# Patient Record
Sex: Male | Born: 1955 | Race: White | Hispanic: No | Marital: Married | Smoking: Never smoker
Health system: Southern US, Community
[De-identification: ages and names within clinical notes are randomized; demographics above are authoritative.]

## PROBLEM LIST (undated history)

## (undated) DIAGNOSIS — Z87442 Personal history of urinary calculi: Secondary | ICD-10-CM

## (undated) DIAGNOSIS — I8390 Asymptomatic varicose veins of unspecified lower extremity: Secondary | ICD-10-CM

## (undated) DIAGNOSIS — I10 Essential (primary) hypertension: Secondary | ICD-10-CM

## (undated) DIAGNOSIS — D126 Benign neoplasm of colon, unspecified: Secondary | ICD-10-CM

## (undated) DIAGNOSIS — K279 Peptic ulcer, site unspecified, unspecified as acute or chronic, without hemorrhage or perforation: Secondary | ICD-10-CM

## (undated) DIAGNOSIS — G459 Transient cerebral ischemic attack, unspecified: Secondary | ICD-10-CM

## (undated) DIAGNOSIS — G4733 Obstructive sleep apnea (adult) (pediatric): Secondary | ICD-10-CM

## (undated) DIAGNOSIS — I890 Lymphedema, not elsewhere classified: Secondary | ICD-10-CM

## (undated) DIAGNOSIS — Z87828 Personal history of other (healed) physical injury and trauma: Secondary | ICD-10-CM

## (undated) DIAGNOSIS — K5790 Diverticulosis of intestine, part unspecified, without perforation or abscess without bleeding: Secondary | ICD-10-CM

## (undated) DIAGNOSIS — J61 Pneumoconiosis due to asbestos and other mineral fibers: Secondary | ICD-10-CM

## (undated) DIAGNOSIS — Z8673 Personal history of transient ischemic attack (TIA), and cerebral infarction without residual deficits: Secondary | ICD-10-CM

## (undated) DIAGNOSIS — K2970 Gastritis, unspecified, without bleeding: Secondary | ICD-10-CM

## (undated) HISTORY — DX: Obstructive sleep apnea (adult) (pediatric): G47.33

## (undated) HISTORY — DX: Lymphedema, not elsewhere classified: I89.0

## (undated) HISTORY — DX: Personal history of transient ischemic attack (TIA), and cerebral infarction without residual deficits: Z86.73

## (undated) HISTORY — DX: Asymptomatic varicose veins of unspecified lower extremity: I83.90

## (undated) HISTORY — DX: Personal history of urinary calculi: Z87.442

## (undated) HISTORY — DX: Peptic ulcer, site unspecified, unspecified as acute or chronic, without hemorrhage or perforation: K27.9

## (undated) HISTORY — PX: KIDNEY STONE SURGERY: SHX686

## (undated) HISTORY — DX: Diverticulosis of intestine, part unspecified, without perforation or abscess without bleeding: K57.90

## (undated) HISTORY — DX: Benign neoplasm of colon, unspecified: D12.6

## (undated) HISTORY — DX: Essential (primary) hypertension: I10

## (undated) HISTORY — DX: Gastritis, unspecified, without bleeding: K29.70

## (undated) HISTORY — DX: Personal history of other (healed) physical injury and trauma: Z87.828

---

## 1982-09-18 HISTORY — PX: VASECTOMY: SHX75

## 1999-01-11 ENCOUNTER — Inpatient Hospital Stay (HOSPITAL_COMMUNITY): Admission: AD | Admit: 1999-01-11 | Discharge: 1999-01-13 | Payer: Self-pay | Admitting: Cardiology

## 2003-03-19 DIAGNOSIS — Z8673 Personal history of transient ischemic attack (TIA), and cerebral infarction without residual deficits: Secondary | ICD-10-CM

## 2003-03-19 HISTORY — DX: Personal history of transient ischemic attack (TIA), and cerebral infarction without residual deficits: Z86.73

## 2003-03-20 ENCOUNTER — Encounter: Payer: Self-pay | Admitting: Neurology

## 2003-03-20 ENCOUNTER — Inpatient Hospital Stay (HOSPITAL_COMMUNITY): Admission: AD | Admit: 2003-03-20 | Discharge: 2003-03-24 | Payer: Self-pay | Admitting: Neurology

## 2003-03-23 ENCOUNTER — Encounter: Payer: Self-pay | Admitting: Neurology

## 2004-04-19 ENCOUNTER — Encounter: Admission: RE | Admit: 2004-04-19 | Discharge: 2004-04-19 | Payer: Self-pay | Admitting: Sports Medicine

## 2005-11-30 ENCOUNTER — Ambulatory Visit (HOSPITAL_COMMUNITY): Admission: RE | Admit: 2005-11-30 | Discharge: 2005-11-30 | Payer: Self-pay | Admitting: Urology

## 2007-12-17 ENCOUNTER — Ambulatory Visit: Payer: Self-pay | Admitting: Vascular Surgery

## 2007-12-18 ENCOUNTER — Ambulatory Visit: Payer: Self-pay | Admitting: Vascular Surgery

## 2008-01-15 ENCOUNTER — Ambulatory Visit: Payer: Self-pay | Admitting: Vascular Surgery

## 2008-01-22 ENCOUNTER — Ambulatory Visit: Payer: Self-pay | Admitting: Vascular Surgery

## 2008-03-06 ENCOUNTER — Ambulatory Visit: Payer: Self-pay | Admitting: Vascular Surgery

## 2008-04-29 ENCOUNTER — Encounter: Admission: RE | Admit: 2008-04-29 | Discharge: 2008-04-29 | Payer: Self-pay | Admitting: General Surgery

## 2008-05-21 ENCOUNTER — Encounter: Admission: RE | Admit: 2008-05-21 | Discharge: 2008-05-21 | Payer: Self-pay | Admitting: General Surgery

## 2008-05-27 ENCOUNTER — Encounter: Admission: RE | Admit: 2008-05-27 | Discharge: 2008-05-27 | Payer: Self-pay | Admitting: Interventional Radiology

## 2008-06-30 ENCOUNTER — Encounter: Admission: RE | Admit: 2008-06-30 | Discharge: 2008-06-30 | Payer: Self-pay | Admitting: Interventional Radiology

## 2008-12-01 ENCOUNTER — Encounter: Admission: RE | Admit: 2008-12-01 | Discharge: 2008-12-01 | Payer: Self-pay | Admitting: Interventional Radiology

## 2010-06-18 HISTORY — PX: VEIN SURGERY: SHX48

## 2011-01-03 DIAGNOSIS — E119 Type 2 diabetes mellitus without complications: Secondary | ICD-10-CM | POA: Insufficient documentation

## 2011-01-31 NOTE — Assessment & Plan Note (Signed)
OFFICE VISIT   LENOX, BINK  DOB:  [birth year]                                       12/18/2007  SAYTK#:[id number]   HISTORY:  The patient returns to the office today for continued followup  of extreme bilateral venous hypertension.  He has venous ulceration  bilaterally much more dramatic on the left than on the right.  He has  undergone venous duplex and this reveals incompetence of his great  saphenous vein on the left.  He has no evidence of deep venous  thrombosis or deep venous incompetence.  I discussed this at length with  the patient.  He does have CEAP class 6 venous hypertension with venous  ulceration.  He is an excellent candidate for intraluminal ablation of  his saphenous vein for improvement of his venous hypertension.  I have  recommended that we proceed with this as soon as possible to hopefully  aid in the healing.  He will continue in the meantime to follow up with  Dr. Burman Freestone at the [facility] wound care center and is being treated  appropriately with compression dressings currently.  He has clearly  failed his conservative treatment and has extreme venous hypertension  which can be improved with ablation.  We will proceed with this as soon  as we have assured insurance coverage.   Larina Earthly, M.D.  Electronically Signed   TFE/MEDQ  D:  12/18/2007  T:  12/18/2007  Job:  [id number]   cc:   Nelta Numbers. Edilia Bo, M.D.  Burman Freestone, Dr

## 2011-01-31 NOTE — Assessment & Plan Note (Signed)
OFFICE VISIT   OLANREWAJU, OSBORN  DOB:  1955-10-11                                       03/06/2008  ZOXWR#:[id number]   The patient presents today for 6-week followup after laser ablation of  the left greater saphenous vein.  He has a history of chronic lower  extremity venous ulcers and major obesity.  He has complete resolved the  erythema from the laser ablation of his thigh.  He has continued to have  treatment at the [facility] with Dr. Burman Freestone regarding his open  venous wounds.  He had had ultrasound of his right leg showing multiple  saphenous veins with no significant reflux, and therefore, is not a  candidate for an ablation on the right leg.  He had questions regarding  the adequacy of treatment in [place] and I told him that we have seen very  good results from the wound center at [facility].  He thinks that he  had family members who had lesser times for healing, and I explained  that this is extremely variable depending on the cause of the wound.  He  will continue his wound treatment and see [place] on an as needed basis.   Larina Earthly, M.D.  Electronically Signed   TFE/MEDQ  D:  03/06/2008  T:  03/09/2008  Job:  [id number]

## 2011-01-31 NOTE — Procedures (Signed)
LOWER EXTREMITY VENOUS REFLUX EXAM   INDICATION:  Follow-up evaluation, status post left leg greater  saphenous vein laser ablation on 01/15/08.  Right leg swelling and pain  with varicosities.   EXAM:  Using color-flow imaging and pulse Doppler spectral analysis, the  right and left common femoral, superficial femoral, popliteal, posterior  tibial, greater and lesser saphenous veins are evaluated.  There is  evidence suggesting mild deep venous insufficiency in the right common  femoral vein and proximal superficial femoral vein.   The right saphenofemoral junction is competent.  The right GSV is  competent .  The anterior branch of the left GSV is thrombosed.  The  posterior and medial branch of the left GSV is patent and competent.   The right and left proximal short saphenous vein demonstrate competency.   GSV Diameter (used if found to be incompetent only)                                            Right    Left  Proximal Greater Saphenous Vein           cm       cm  Proximal-to-mid-thigh                     cm       cm  Mid thigh                                 cm       cm  Mid-distal thigh                          cm       cm  Distal thigh                              cm       cm  Knee                                      cm       cm   IMPRESSION:  1. No right or left greater saphenous vein reflux is identified.  2. The right and left greater saphenous veins are not aneurysmal.  3. The right and left greater saphenous veins are not tortuous.  4. The deep venous system has mild reflux in the right common femoral      vein and proximal right superficial femoral vein.  5. The right and left lesser saphenous veins are competent.  6. The anterior branch of the left greater saphenous vein is      thrombosed.  The posterior and medial branch is patent and      competent.  7. No evidence of deep venous thrombosis bilaterally.  8. The anterior branch of the left  greater saphenous vein demonstrates      a very small amount of incompetent flow in the proximal 3 cm of the      thrombosed vessel.   ___________________________________________  Larina Earthly, M.D.   MC/MEDQ  D:  01/22/2008  T:  01/22/2008  Job:  [id number]

## 2011-01-31 NOTE — Assessment & Plan Note (Signed)
OFFICE VISIT   [name], [name] [name]  DOB:  [birth year]                                       01/22/2008  GNFAO#:[id number]   The patient presents today for followup of his laser ablation of his  left leg great saphenous vein from 01/15/2008.  He had the usual amount  of soreness in the thigh at the ablation site.  He underwent venous  duplex today of this area and this shows no evidence of deep venous  thrombosis.  It does show closure of his great saphenous vein from the  knee up to the saphenofemoral junction.  He also underwent evaluation of  his right leg due to the swelling on his right leg.  He does have  several branches of his great saphenous vein and these are all competent  by duplex.  I discussed this with the patient and explained that there  is no role for ablation on the right leg.  He will continue with his  compression wraps and I will see him again in 6 weeks for final  followup.   Larina Earthly, M.D.  Electronically Signed   TFE/MEDQ  D:  01/22/2008  T:  01/23/2008  Job:  [id number]   cc:   Fara Chute  Dr. Yolanda Bonine

## 2011-01-31 NOTE — Assessment & Plan Note (Signed)
OFFICE VISIT   [name] [name], [name] [name]  DOB:  11/06/1955                                       12/17/2007  ZOXWR#:[id number]   I saw the patient in the office today in continued followup of his  chronic venous insufficiency.  I had last seen him in March 2006.  He  has had a long history of chronic venous insufficiency in both lower  extremities.  In September of this year, he began having increasing  problems with swelling and seepage from the left leg.  He saw Dr. Burman Freestone  in December, who has been treating this aggressively with compression  therapy and elevation.  However, he has continued to have some seepage  from the leg, and he was sent for further evaluation.  He has had only 1  small open ulcer.  He is not aware of any previous history of DVT or  phlebitis.   PAST MEDICAL HISTORY:  Significant for hypertension,  hypercholesterolemia, and obesity.  In addition, he had a stroke in  2004.   FAMILY HISTORY:  There is no history of premature cardiovascular  disease.   SOCIAL HISTORY:  He is married.  He has 3 children.  He works as a  Facilities manager.  He does not use tobacco.   REVIEW OF SYSTEMS AND MEDICATIONS:  Documented on the medical history  form in his chart.   PHYSICAL EXAMINATION:  This is a pleasant 55 year old gentleman who  appears his stated age.  Blood pressure is 140/80, heart rate 60,  saturation 95%.  HEENT, neck is supple.  There is no cervical  lymphadenopathy.  I do not detect any carotid bruits.  Lungs are clear  bilaterally to auscultation.  On cardiac exam, he has a regular rate and  rhythm.  Abdomen is obese and difficult to assess for an aneurysm.  He  has palpable femoral pulses and a palpable left dorsalis pedis pulse.  Because of the swelling, I could not palpate posterior tibial pulses or  a right dorsalis pedis pulse.  He does have biphasic Doppler signals in  the dorsalis pedis and posterior tibial positions bilaterally.  He  has  significant hyperpigmentation bilaterally and severe bilateral lower  extremity swelling, more significant on the left side.  He has eczema  and lymphatic seepage in the left leg.   He did have a recent venous duplex scan on 11/21/2007, which showed no  incompetence of the deep system on the left and no evidence of DVT.  The  superficial system is not evaluated.  He also had normal ABI back in  December 2008.  He has had a previous venous duplex today in our office  back in 2002, which showed incompetence of the right greater saphenous  vein below the knee.  On the left side, he had incompetence of the left  saphenofemoral junction, greater saphenous vein, and lesser saphenous  vein.   Given continued problems with seepage from the left leg and significant  chronic venous insufficiency, despite aggressive outpatient care, I  think it would be reasonable to consider laser ablation of the left  greater saphenous vein to help improve his venous hemodynamics and  potentially help control his seepage in the left leg.  We will schedule  him to see Dr. Arbie Cookey or Dr. Hart Rochester, who have been performing the laser  ablations, and will also have them discuss this with our vein nurse, Clementeen Hoof.  In the meantime, he will continue with his elevation and  compression therapy.   Di Kindle. Edilia Bo, M.D.  Electronically Signed   CSD/MEDQ  D:  12/17/2007  T:  12/18/2007  Job:  [id number]

## 2011-01-31 NOTE — Procedures (Signed)
LOWER EXTREMITY VENOUS REFLUX EXAM   INDICATION:  Left leg varicose veins.   EXAM:  Using color-flow imaging and pulse Doppler spectral analysis, the  left common femoral, superficial femoral, popliteal, posterior tibial,  greater and lesser saphenous veins are evaluated.  There is evidence  suggesting deep venous insufficiency in the left common femoral vein.  The remaining deep veins appear to be competent.   The left saphenofemoral junction is not competent.  The anterior branch  of the left GSV is not competent with the caliber as described below.   The more medial and deeper branch of the GSV is competent to the mid  thigh.  At the mid-thigh there is a perforator which demonstrates  antegrade (superficial to deep) flow with Valsalva maneuver.  Distal to  the perforator the medial deep branch of the GSV demonstrates antegrade  flow with Valsalva maneuver.   Left proximal short saphenous vein demonstrates competency.   GSV Diameter (used if found to be incompetent only)                                            Right    Left  Proximal Greater Saphenous Vein           cm       Greater saphenous  vein diameter (anterior branch) 1.3 cm  Proximal-to-mid-thigh                     cm       1.0 cm  Mid thigh                                 cm       0.8 cm  Mid-distal thigh                          cm       0.9 cm  Distal thigh                              cm       0.8 cm  Knee                                      cm       1.0 cm   IMPRESSION:  1. Left greater saphenous vein reflux is identified with the caliber      ranging from 1.3 cm to 0.8 cm knee to groin.  2. The left greater saphenous vein is not aneurysmal.  3. The left greater saphenous vein is not tortuous.  4. The deep venous system is not competent in the left common femoral      vein.  5. The left lesser saphenous vein is competent.  6. Left greater saphenous vein has two branches. The anterior  branch      is completely incompetent including the saphenofemoral junction.      The medial branch is competent; however, at the mid-thigh there is      a large perforator.  Distal to the perforator the deeper medial      branch demonstrates antegrade flow with Valsalva maneuver that  flows into the perforator.   ___________________________________________  [name] [name]. Edilia Bo, M.D.   MC/MEDQ  D:  12/17/2007  T:  12/17/2007  Job:  [id number]

## 2011-02-03 NOTE — Discharge Summary (Signed)
NAME:  [name] [name], [name] [name]                        ACCOUNT NO.:  [id number]   MEDICAL RECORD NO.:  [id number]                   PATIENT TYPE:  INP   LOCATION:  3036                                 FACILITY:  [facility]   PHYSICIAN:  Casimiro Needle L. Thad Ranger, M.D.           DATE OF BIRTH:  1956-03-01   DATE OF ADMISSION:  03/20/2003  DATE OF DISCHARGE:  03/24/2003                                 DISCHARGE SUMMARY   ADMISSION DIAGNOSES:  1. Right hand clumsiness, presumed left brain subcortical stroke.  2. Hypertension.  3. Morbid obesity.  4. 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   DISCHARGE DIAGNOSES:  1. 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  2. Hypertension.  3. Morbid obesity.  4. Possible sleep apnea.  5. 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  6. Low high-density lipoprotein (HDL.  7. Fatty liver.   CONDITION ON DISCHARGE:  Stable.   DIET:  Low-salt, low-fat, low cholesterol.   ACTIVITY:  Per outpatient therapists.  No driving and no work until further  notice.   DISCHARGE MEDICATIONS:  1. Plavix 75 mg p.o. daily (new).  2. Norvasc 10 mg daily (new).  3. Crestor 10 mg daily (new).  4. Toprol 50 mg daily (new dose).  5. Aspirin 325 mg daily.  6. Diovan 320 mg daily.  7. HCTZ 25 mg daily.  8. Amitriptyline 10 mg q.h.s. (new).   LABORATORY DATA:  1. MRI of the brain performed 03/20/2003 demonstrated an acute infarct in the     left pons subsequently confirmed on thin section with old strokes in the     right pons, right cerebellum, and right thalamus with mild white matter     changes.  2. MR angiogram in circle of Willis, mild anterior cranial atherosclerosis     but no severe abnormalities.  3. MRA of the extracranial circulation with contrast.  No hemodynamic     significant stenosis at the carotids, some ectasia of the  right vertebral     artery.  4. Telemetry monitoring demonstrated sinus bradycardia first two days of     admission.  5. Laboratory review:  CBC normal.  CMET normal except for elevated AST and     ALT in 40 to 70 range.  Lipid panel: Total cholesterol 158, HDL 28, LDL     107.  Normal homocysteine level.  Iron panel normal.  Hepatitis panel     negative.  Drug screen negative.  Urinalysis negative.  6. Abdominal ultrasound March 21, 2003, demonstrated fatty liver, no other     acute findings.  7. Modified barium swallow March 23, 2003, showed mild delay on swallow reflex     and one episode of silent aspiration with thin liquids, resolved with     chin tuck.  8. A 2-D echocardiogram is pending  at the time of discharge.   HOSPITAL COURSE:  Please see admission H&P for full admission details.  Briefly, this is a 55 year old man with a history of previous pontine stroke  who presented with an acute right hemiparesis developing from the day  before.   He was admitted for presumed stroke and underwent routine workup.  MRI of  the brain did confirm the presence of a left pontine stroke.  The patient  had a right hemiparesis which fluctuated a bit during hospitalization, on  one occasion requiring a fluid bolus and oxygen on the evening of 03/21/2003  due to worsening of symptoms, but otherwise stable throughout  hospitalization.  He received routine stroke workup with the results as  noted above.  His blood pressure was elevated in the 171 to 180 systolic  range throughout the first day or two of the admission, and a new  hypertensive medication was started with some improvement.  He would benefit  from statin therapy for secondary stroke prophylaxis and for his low HDL;  however, due to his elevated liver enzymes, this would have to be monitored  very closely.  This was, however, started at the time of discharge.  Also,  his metoprolol dose was reduced so he could increase his exercise  tolerance.  We will arrange for an outpatient sleep study due to his morbid obesity.  Physical, occupational, and speech therapy were all consulted, and decision  was rehabilitation.  He is felt to be stable enough by the therapists and  rehabilitation doctors for outpatient therapy, and this is being arranged at  the time of discharge.   On the day of discharge, he continued to have a mild dysarthria, mild  pseudobulbar affect, which improved somewhat on low-dose amitriptyline, and  moderate right-hand weakness and clumsiness with mild difficulty with gait.  He was, however, ambulating very well with a rolling walker.  He will be  discharged home into the care of his family with subsequent close followup.   FOLLOW UP:  He was advised to call [facility] at [phone] for a  followup appointment with Kelli Hope, M.D. in four weeks.  He also  needs to see his primary physician in four weeks to have liver function  tests drawn and to follow up on his routine care.                                               [name] Thad Ranger, M.D.    MLR/MEDQ  D:  03/24/2003  T:  03/24/2003  Job:  [id number]   cc:   Fara Chute, M.D.  [place]    cc:   Fara Chute, M.D.  [employer]

## 2011-02-03 NOTE — H&P (Signed)
NAME:  [name] [name], [name] [name]                        ACCOUNT NO.:  [id number]   MEDICAL RECORD NO.:  [id number]                   PATIENT TYPE:  INP   LOCATION:  3036                                 FACILITY:  [facility]   PHYSICIAN:  Casimiro Needle L. Thad Ranger, M.D.           DATE OF BIRTH:  14-Jun-1956   DATE OF ADMISSION:  03/20/2003  DATE OF DISCHARGE:                                HISTORY & PHYSICAL   CHIEF COMPLAINT:  Right hand clumsiness.   HISTORY OF PRESENT ILLNESS:  This is the second [facility]  admission and the initial stroke service admission for this 55 year old man  with a past medical history including morbid obesity, hypertension and a  previous stroke in 2000, at which time he was evaluated in consultation by  Dr. Gustavus Messing. Weymann.  The patient reports that last night he was  playing the piano and noted a little bit of dysfunction in his right hand.  He did not have any pain but had some mild tingling in the hand.  He was  also noted to have some slurring of speech by his family this morning.  He  fells his symptoms were a little bit worse and he went to [facility]  for further evaluation.  He was given Plavix and at his family's insistence,  was transferred to [facility] for further management.  The patient said that  his symptoms have been relatively stable over the day.  He does note that he  worked in his attic two days ago in extreme heat and felt sore the next  morning.  He denies any recent symptoms like this before, although these  symptoms are similar to what he presented with in 2000 with his stroke,  however, his symptoms were left-sided at that time.   PAST MEDICAL HISTORY:  Hypertension, uncertain of how good control of this  is.  He had the previous stroke, as noted above.  He also has a history of  morbid obesity, kidney stones and venous insufficiency of the lower  extremities for which he has seen Dr. Di Kindle. Edilia Bo.  He also  has a  history of impotence.   FAMILY HISTORY:  His grandfather and father both died at age 20 of sudden  death, possibly due to cardiac causes.   SOCIAL HISTORY:  He lives in [place] with his wife.  He denies tobacco or  alcohol use.  He does work.   REVIEW OF SYSTEMS:  He does have some lower extremity edema and wears  compression stockings.  Full 10-system review of systems is otherwise  unremarkable.   MEDICATIONS:  1. Diovan 320 mg daily.  2. Metoprolol 50 mg two b.i.d.  3. Aspirin 325 mg daily.  4. Hydrochlorothiazide 25 mg daily.  5. Viagra or Levitra p.r.n.   PHYSICAL EXAMINATION:  VITAL SIGNS:  Temperature 95.7, blood pressure  168/110, pulse 48, respirations 22, O2 saturation 96% on  room air.  GENERAL:  This is a healthy-appearing but morbidly obese man in no evident  distress.  HEAD:  Cranium is normocephalic and atraumatic.  Oropharynx is benign.  NECK:  Neck is supple without carotid bruits.  HEART:  Bradycardic, regular rhythm, no murmurs.  CHEST:  Clear to auscultation.  ABDOMEN:  Abdomen soft and nontender.  Normoactive bowel sounds.  EXTREMITIES:  Pitting edema 1+ in the lower extremities.  The pulses are  present.  NEUROLOGIC:  Mental status:  He is awake and alert.  Speech is fluent and  does not appear to be dysarthric to me.  Recent and remote memory are  intact.  Attention span, concentration and fund of knowledge are  appropriate.  He has no defects to confrontational naming, can repeat a  phrase.  Cranial nerves:  Funduscopic exam is benign.  Pupils are equal and  briskly reactive.  Extraocular movements are normal without nystagmus.  Visual fields are full to confrontation.  Face, tongue and palate move  normally and symmetrically.  Motor:  Normal bulk and tone.  He has a bit of  weakness in the intrinsic muscles of the right hand, otherwise, normal  strength throughout.  Sensation is intact to light touch in all extremities.  Reflexes are 1+ and  symmetric.  Toes are downgoing.  Rapid movements are  performed a little bit slowly with the right hand and he has a little bit of  dysmetria with finger-to-nose on the right, but performs heel-to-shin well.  He rises from the bed with a little bit of difficulty and is mildly unsteady  placing his right foot, particularly with tandem walking.   LABORATORY REVIEW:  [facility] labs are pending.  Labs from [place] are  reviewed; he had a normal CBC and CMET and coagulations.   CT of the head is personally reviewed and is essentially unremarkable.   IMPRESSION:  1. Acute mild right hemiparesis most likely due to small subcortical left     brain acute stroke.  His risk factors include hypertension, obesity and     previous stroke.  2. Previous right pontine stroke with no significant residual.  3. Hypertension.  4. Morbid obesity.   PLAN:  Admit to stroke unit and will proceed with routine stroke workup  including MRI, MRA, echocardiogram and carotid Doppler if necessary.  We  will check fasting lipids and homocysteine.  We will add Plavix to aspirin.  PT and OT will evaluate.  Disposition pending above.                                               [name] Thad Ranger, M.D.    MLR/MEDQ  D:  03/20/2003  T:  03/21/2003  Job:  [id number]

## 2011-03-06 ENCOUNTER — Other Ambulatory Visit: Payer: Self-pay | Admitting: Interventional Radiology

## 2011-03-06 DIAGNOSIS — L97909 Non-pressure chronic ulcer of unspecified part of unspecified lower leg with unspecified severity: Secondary | ICD-10-CM

## 2011-03-06 DIAGNOSIS — I872 Venous insufficiency (chronic) (peripheral): Secondary | ICD-10-CM

## 2011-03-15 ENCOUNTER — Ambulatory Visit
Admission: RE | Admit: 2011-03-15 | Discharge: 2011-03-15 | Disposition: A | Discharge status: Home / Self Care | Payer: 59 | Source: Ambulatory Visit | Attending: Interventional Radiology | Admitting: Interventional Radiology

## 2011-03-15 DIAGNOSIS — I872 Venous insufficiency (chronic) (peripheral): Secondary | ICD-10-CM

## 2011-03-15 DIAGNOSIS — L97909 Non-pressure chronic ulcer of unspecified part of unspecified lower leg with unspecified severity: Secondary | ICD-10-CM

## 2011-03-15 NOTE — Progress Notes (Signed)
PT WAS SEEN BY DR HOSS FOR A NEW DEVELOPED NON-HEALING ULCER ON LLE.  REFERRED PT TO WOUND CARE IN [place], [place] AND WILL SUBMIT INFORMATION FOR ANOTHER LASER TREATMENT.

## 2013-03-24 ENCOUNTER — Encounter (HOSPITAL_BASED_OUTPATIENT_CLINIC_OR_DEPARTMENT_OTHER): Payer: 59 | Attending: General Surgery

## 2013-03-24 DIAGNOSIS — Z7982 Long term (current) use of aspirin: Secondary | ICD-10-CM | POA: Insufficient documentation

## 2013-03-24 DIAGNOSIS — Z8673 Personal history of transient ischemic attack (TIA), and cerebral infarction without residual deficits: Secondary | ICD-10-CM | POA: Insufficient documentation

## 2013-03-24 DIAGNOSIS — I87319 Chronic venous hypertension (idiopathic) with ulcer of unspecified lower extremity: Secondary | ICD-10-CM | POA: Insufficient documentation

## 2013-03-24 DIAGNOSIS — E669 Obesity, unspecified: Secondary | ICD-10-CM | POA: Insufficient documentation

## 2013-03-24 DIAGNOSIS — I10 Essential (primary) hypertension: Secondary | ICD-10-CM | POA: Insufficient documentation

## 2013-03-24 DIAGNOSIS — I890 Lymphedema, not elsewhere classified: Secondary | ICD-10-CM | POA: Insufficient documentation

## 2013-03-24 DIAGNOSIS — Z7902 Long term (current) use of antithrombotics/antiplatelets: Secondary | ICD-10-CM | POA: Insufficient documentation

## 2013-03-24 DIAGNOSIS — Z79899 Other long term (current) drug therapy: Secondary | ICD-10-CM | POA: Insufficient documentation

## 2013-03-24 DIAGNOSIS — L97909 Non-pressure chronic ulcer of unspecified part of unspecified lower leg with unspecified severity: Secondary | ICD-10-CM | POA: Insufficient documentation

## 2013-03-24 DIAGNOSIS — I872 Venous insufficiency (chronic) (peripheral): Secondary | ICD-10-CM | POA: Insufficient documentation

## 2013-03-25 NOTE — Progress Notes (Signed)
[facility]  NAME:  AUTHOR, HATLESTAD              ACCOUNT NO.:  [id number]  MEDICAL RECORD NO.:  [id number]      DATE OF BIRTH:  [birth year]  PHYSICIAN:  Ardath Sax, M.D.      VISIT DATE:  03/24/2013                                  OFFICE VISIT   HISTORY:  This is a 57 year old obese man who has a history of having had a stroke 10 years ago.  He is on Diovan for blood pressure.  He is also on aspirin and Plavix, Lipitor, metoprolol, and allopurinol.  This man comes to [place] because of multiple ulcers.  He has severe venous hypertension and venous stasis ulcers in bilateral lower extremity lymphedema.  We washed these wounds up and we put him in compression Unna boots, and put a silver alginate over the wounds, and he will come back in a week.  This man as I say had a stroke several years ago and he is on anticoagulants.  Today, he has got bilateral venous stasis ulcers that are anywhere from 1 to 4 cm in diameter.  His vital signs were relatively normal.  He had a blood pressure of 150/90, pulse 80, temperature 98.6.  He weighs 339 pounds, so we will treat him with Santyl and Unna boots and silver alginate and have him come back in a week.  DIAGNOSES: 1. Bilateral venous hypertension, bilateral venous ulcers, and     bilateral lymphedema of the legs. 2. Previous cerebrovascular accident 10 years ago.  Other history is     hypertension.     Ardath Sax, M.D.     PP/MEDQ  D:  03/24/2013  T:  03/25/2013  Job:  [id number]

## 2013-04-08 NOTE — Progress Notes (Signed)
[facility]  NAME:  [name], [name] [name] NO.:  [id number]  MEDICAL RECORD NO.:  [id number]      DATE OF BIRTH:  07-17-1956  PHYSICIAN:  Ardath Sax, M.D.           VISIT DATE:                                  OFFICE VISIT   He is a 57 year old gentleman who suffers from venous hypertension and chronic venous ulcers on both of his legs.  We have been treating him with collagen dressings and compression dressings from his feet to his knees.  The ulcers have cleaned up nicely, but they stayed about the same size especially on his left leg.  I would like to apply for using Apligraf dressings and I think these would greatly benefit him healing these venous stasis ulcers.     Ardath Sax, M.D.     PP/MEDQ  D:  04/07/2013  T:  04/08/2013  Job:  [id number]

## 2013-04-21 ENCOUNTER — Encounter (HOSPITAL_BASED_OUTPATIENT_CLINIC_OR_DEPARTMENT_OTHER): Payer: 59 | Attending: General Surgery

## 2013-04-21 DIAGNOSIS — L97909 Non-pressure chronic ulcer of unspecified part of unspecified lower leg with unspecified severity: Secondary | ICD-10-CM | POA: Insufficient documentation

## 2013-04-21 DIAGNOSIS — I87319 Chronic venous hypertension (idiopathic) with ulcer of unspecified lower extremity: Secondary | ICD-10-CM | POA: Insufficient documentation

## 2013-05-04 ENCOUNTER — Encounter (HOSPITAL_COMMUNITY): Payer: Self-pay | Admitting: Emergency Medicine

## 2013-05-04 ENCOUNTER — Emergency Department (HOSPITAL_COMMUNITY)
Admission: EM | Admit: 2013-05-04 | Discharge: 2013-05-04 | Disposition: A | Discharge status: Home / Self Care | Payer: 59 | Attending: Emergency Medicine | Admitting: Emergency Medicine

## 2013-05-04 DIAGNOSIS — Z7902 Long term (current) use of antithrombotics/antiplatelets: Secondary | ICD-10-CM | POA: Insufficient documentation

## 2013-05-04 DIAGNOSIS — I8310 Varicose veins of unspecified lower extremity with inflammation: Secondary | ICD-10-CM | POA: Insufficient documentation

## 2013-05-04 DIAGNOSIS — Z7982 Long term (current) use of aspirin: Secondary | ICD-10-CM | POA: Insufficient documentation

## 2013-05-04 DIAGNOSIS — Z792 Long term (current) use of antibiotics: Secondary | ICD-10-CM | POA: Insufficient documentation

## 2013-05-04 DIAGNOSIS — Z87442 Personal history of urinary calculi: Secondary | ICD-10-CM | POA: Insufficient documentation

## 2013-05-04 DIAGNOSIS — IMO0001 Reserved for inherently not codable concepts without codable children: Secondary | ICD-10-CM

## 2013-05-04 DIAGNOSIS — Z79899 Other long term (current) drug therapy: Secondary | ICD-10-CM | POA: Insufficient documentation

## 2013-05-04 DIAGNOSIS — E669 Obesity, unspecified: Secondary | ICD-10-CM | POA: Insufficient documentation

## 2013-05-04 DIAGNOSIS — R609 Edema, unspecified: Secondary | ICD-10-CM | POA: Insufficient documentation

## 2013-05-04 DIAGNOSIS — D689 Coagulation defect, unspecified: Secondary | ICD-10-CM | POA: Insufficient documentation

## 2013-05-04 DIAGNOSIS — Z8673 Personal history of transient ischemic attack (TIA), and cerebral infarction without residual deficits: Secondary | ICD-10-CM | POA: Insufficient documentation

## 2013-05-04 DIAGNOSIS — Z8709 Personal history of other diseases of the respiratory system: Secondary | ICD-10-CM | POA: Insufficient documentation

## 2013-05-04 DIAGNOSIS — I10 Essential (primary) hypertension: Secondary | ICD-10-CM | POA: Insufficient documentation

## 2013-05-04 HISTORY — DX: Pneumoconiosis due to asbestos and other mineral fibers: J61

## 2013-05-04 HISTORY — DX: Transient cerebral ischemic attack, unspecified: G45.9

## 2013-05-04 NOTE — ED Provider Notes (Signed)
This is a 57 year old male with a history of chronic venous stasis ulcers and currently on Plavix. The patient noted soaking through his dressing this morning after getting back from church.  The patient normally gets wound care and dressing changes every Monday.  He denies any fevers or systemic symptoms.    Examination of patient's left lower extremity reveals 2+ edema.  Her venous stasis ulcers and the entire or extremity. There is some clotted blood noted within some of the ulcers. There is no active bleeding. There is no evidence of erythema suggesting cellulitis.  Patient states that the erythema is at baseline.     we will redress the patient's wound as there is no evidence of active bleeding. The patient will followup tomorrow at his regularly scheduled appointment for a dressing change.    After history, exam, and medical workup I feel the patient has been appropriately medically screened and is safe for discharge home. Pertinent diagnoses were discussed with the patient. Patient was given return precautions.  Shon Baton, MD 05/04/13 [phone]

## 2013-05-04 NOTE — ED Notes (Signed)
Pt alert, arrives from home, c/o bleeding wound to left lower leg, onset this am, resp even unlabored

## 2013-05-04 NOTE — ED Provider Notes (Signed)
CSN: [id number]     Arrival date & time 05/04/13  1502 History     First MD Initiated Contact with Patient 05/04/13 1521     Chief Complaint  Patient presents with  . Wound Check    Left lower leg   (Consider location/radiation/quality/duration/timing/severity/associated sxs/prior Treatment) HPI  57 year old male with history of venous stasis ulcer presents for evaluations of bleeding wound to left lower leg.  Patient states he was arriving home from church when he noticed that his pants was soaking wet. He noticed it was soaked with blood. He removed his pants and realized bleeding was from his left leg dressing.  He denies any significant pain. Denies lightheadedness dizziness, fever, chills, chest pain or shortness of breath, or any recent trauma. States the last time that his wound had any bleeding was 10 years ago and it only lasts for about 15 minutes. This bleeding has been ongoing for unknown amount of time.  Patient is currently taking Plavix and aspirin due to prior stroke 10 years ago that affected his right side. Patient also and wound management clinic every Monday for the past 3 weeks. He also had wound care at [facility] 7 month prior. He is currently taking doxycycline as well. His next wound management is scheduled for tomorrow.  Past Medical History  Diagnosis Date  . TIA (transient ischemic attack)   . Stroke   . Kidney stones   . Hypertension   . Asbestosis    Past Surgical History  Procedure Laterality Date  . Vein surgery     No family history on file. History  Substance Use Topics  . Smoking status: Not on file  . Smokeless tobacco: Not on file  . Alcohol Use: Not on file    Review of Systems  Constitutional: Negative for fever.  Respiratory: Negative for shortness of breath.   Cardiovascular: Negative for chest pain.  Skin: Positive for wound.  All other systems reviewed and are negative.    Allergies  Review of patient's allergies indicates no known  allergies.  Home Medications   Current Outpatient Rx  Name  Route  Sig  Dispense  Refill  . allopurinol (ZYLOPRIM) 300 MG tablet   Oral   Take 300 mg by mouth daily.         Marland Kitchen aspirin 325 MG tablet   Oral   Take 325 mg by mouth daily.         Marland Kitchen atorvastatin (LIPITOR) 10 MG tablet   Oral   Take 10 mg by mouth daily.         . clopidogrel (PLAVIX) 75 MG tablet   Oral   Take 75 mg by mouth daily.         Marland Kitchen doxycycline (VIBRA-TABS) 100 MG tablet   Oral   Take 100 mg by mouth 2 (two) times daily.         Marland Kitchen ibuprofen (ADVIL,MOTRIN) 200 MG tablet   Oral   Take 800 mg by mouth every 6 (six) hours as needed for pain.         . metoprolol (LOPRESSOR) 100 MG tablet   Oral   Take 100 mg by mouth 2 (two) times daily.         . valsartan-hydrochlorothiazide (DIOVAN-HCT) 320-25 MG per tablet   Oral   Take 1 tablet by mouth daily.          BP 167/88  Pulse 56  Temp(Src) 98 F (36.7 C) (Oral)  Resp 20  SpO2 95% Physical Exam  Nursing note and vitals reviewed. Constitutional: He is oriented to person, place, and time.  Chronically ill appearing, obese, in no acute distress, nontoxic  HENT:  Head: Normocephalic and atraumatic.  Mouth/Throat: Oropharynx is clear and moist.  Eyes: Conjunctivae are normal.  Neck: Neck supple.  Abdominal:  Protuberant abdomen  Musculoskeletal: He exhibits edema (Bilateral 3+ pitting edema to lower extremities, chronic).  Neurological: He is alert and oriented to person, place, and time.  Skin:  Left lower leg with unnaboot and dressing in place.  Dressing extending from below knee to proximal aspect of toes.  Dry blood noted, strong odor.    Dressing were removed, showing good granulated skin with several ulcerated spots without active bleeding.  No obvious evidence of infection, nontender on exam.  Intact distal pulses, brisk cap refills.      ED Course   Procedures (including critical care time)  3:52 PM Pt here for  evaluation of his chronic left lower leg wound in which he has weekly wound care at wound care center.  No active bleeding at this time, pt currently on Plavix and ASA.  Wound without obvious evidence of worsening infection.  Plan to rewrap wound with nonoclusive petroleum jelly based dressing and pt will f/u with wound care tomorrow for further management.  My attending has seen and evaluate pt.  Pt agrees with plan.    5:18 PM Pt's wound has been wrapped, pt will f/u tomorrow for further care.   Labs Reviewed - No data to display No results found. 1. Venous stasis ulcers, left   2. Coagulation problem     MDM  BP 128/72  Pulse 57  Temp(Src) 97.8 F (36.6 C) (Oral)  Resp 18  SpO2 90%   Fayrene Helper, PA-C 05/04/13 1718

## 2013-05-04 NOTE — ED Notes (Signed)
Removed dressings with PA Bowie at bedside.

## 2013-05-04 NOTE — ED Notes (Signed)
Bed: ZO10 Expected date:  Expected time:  Means of arrival:  Comments: HOLD TRIAGE ONE

## 2013-05-04 NOTE — ED Notes (Signed)
Patient being treated at wound care center comes in with bleeding through the wrap that was placed on his left lower extremity on Monday.  Dressing has a foul odor.

## 2013-05-05 NOTE — ED Provider Notes (Signed)
Medical screening examination/treatment/procedure(s) were conducted as a shared visit with non-physician practitioner(s) and myself.  I personally evaluated the patient during the encounter   Shon Baton, MD 05/05/13 [phone]

## 2013-05-20 ENCOUNTER — Encounter (HOSPITAL_BASED_OUTPATIENT_CLINIC_OR_DEPARTMENT_OTHER): Payer: 59 | Attending: General Surgery

## 2013-05-20 DIAGNOSIS — I872 Venous insufficiency (chronic) (peripheral): Secondary | ICD-10-CM | POA: Insufficient documentation

## 2013-05-20 DIAGNOSIS — L97809 Non-pressure chronic ulcer of other part of unspecified lower leg with unspecified severity: Secondary | ICD-10-CM | POA: Insufficient documentation

## 2013-06-30 ENCOUNTER — Encounter (HOSPITAL_BASED_OUTPATIENT_CLINIC_OR_DEPARTMENT_OTHER): Payer: 59 | Attending: General Surgery

## 2013-06-30 DIAGNOSIS — L97909 Non-pressure chronic ulcer of unspecified part of unspecified lower leg with unspecified severity: Secondary | ICD-10-CM | POA: Insufficient documentation

## 2013-06-30 DIAGNOSIS — I87319 Chronic venous hypertension (idiopathic) with ulcer of unspecified lower extremity: Secondary | ICD-10-CM | POA: Insufficient documentation

## 2013-07-21 ENCOUNTER — Encounter (HOSPITAL_BASED_OUTPATIENT_CLINIC_OR_DEPARTMENT_OTHER): Payer: Medicare Other | Attending: General Surgery

## 2013-07-21 DIAGNOSIS — I87319 Chronic venous hypertension (idiopathic) with ulcer of unspecified lower extremity: Secondary | ICD-10-CM | POA: Insufficient documentation

## 2013-07-21 DIAGNOSIS — L97909 Non-pressure chronic ulcer of unspecified part of unspecified lower leg with unspecified severity: Secondary | ICD-10-CM | POA: Diagnosis not present

## 2013-07-22 ENCOUNTER — Encounter: Payer: Self-pay | Admitting: Internal Medicine

## 2013-07-22 ENCOUNTER — Ambulatory Visit (INDEPENDENT_AMBULATORY_CARE_PROVIDER_SITE_OTHER): Payer: 59 | Admitting: Internal Medicine

## 2013-07-22 VITALS — BP 150/92 | Ht 70.5 in | Wt 351.9 lb

## 2013-07-22 DIAGNOSIS — G4733 Obstructive sleep apnea (adult) (pediatric): Secondary | ICD-10-CM | POA: Insufficient documentation

## 2013-07-22 DIAGNOSIS — E7800 Pure hypercholesterolemia, unspecified: Secondary | ICD-10-CM | POA: Diagnosis not present

## 2013-07-22 DIAGNOSIS — R0600 Dyspnea, unspecified: Secondary | ICD-10-CM

## 2013-07-22 DIAGNOSIS — I87319 Chronic venous hypertension (idiopathic) with ulcer of unspecified lower extremity: Secondary | ICD-10-CM | POA: Diagnosis not present

## 2013-07-22 DIAGNOSIS — I83893 Varicose veins of bilateral lower extremities with other complications: Secondary | ICD-10-CM | POA: Insufficient documentation

## 2013-07-22 DIAGNOSIS — R0609 Other forms of dyspnea: Secondary | ICD-10-CM

## 2013-07-22 DIAGNOSIS — I878 Other specified disorders of veins: Secondary | ICD-10-CM

## 2013-07-22 DIAGNOSIS — I10 Essential (primary) hypertension: Secondary | ICD-10-CM | POA: Diagnosis not present

## 2013-07-22 DIAGNOSIS — L97909 Non-pressure chronic ulcer of unspecified part of unspecified lower leg with unspecified severity: Secondary | ICD-10-CM | POA: Diagnosis not present

## 2013-07-22 DIAGNOSIS — E119 Type 2 diabetes mellitus without complications: Secondary | ICD-10-CM | POA: Diagnosis not present

## 2013-07-22 DIAGNOSIS — R0989 Other specified symptoms and signs involving the circulatory and respiratory systems: Secondary | ICD-10-CM

## 2013-07-22 DIAGNOSIS — I83009 Varicose veins of unspecified lower extremity with ulcer of unspecified site: Secondary | ICD-10-CM | POA: Insufficient documentation

## 2013-07-22 DIAGNOSIS — R0683 Snoring: Secondary | ICD-10-CM

## 2013-07-22 DIAGNOSIS — I872 Venous insufficiency (chronic) (peripheral): Secondary | ICD-10-CM

## 2013-07-22 NOTE — Patient Instructions (Addendum)
Your physician has requested that you have an echocardiogram. Echocardiography is a painless test that uses sound waves to create images of your heart. It provides your doctor with information about the size and shape of your heart and how well your heart's chambers and valves are working. This procedure takes approximately one hour. There are no restrictions for this procedure.  Your physician has requested that you have a lower extremity venous duplex. This test is an ultrasound of the veins in the legs. It looks at venous blood flow that carries blood from the heart to the legs. Allow one hour for a Lower Venous exam. There are no restrictions or special instructions.  Your physician has recommended that you have a sleep study. This test records several body functions during sleep, including: brain activity, eye movement, oxygen and carbon dioxide blood levels, heart rate and rhythm, breathing rate and rhythm, the flow of air through your mouth and nose, snoring, body muscle movements, and chest and belly movement. -- we have referred you for this at [facility]  Your physician recommends that you schedule a follow-up appointment after your tests.

## 2013-07-22 NOTE — Progress Notes (Signed)
OFFICE NOTE  Chief Complaint:  Non-healing ulcers  Primary Care Physician: Estanislado Pandy, MD  HPI:  [name] [name] is a pleasant 57 year old morbidly obese male who is followed by Dr. Fara Chute in [place]. He's been seeing Dr. Jimmey Ralph for wound care do 2 nonhealing venous ulcers of the left leg. According to the patient, he had been diagnosed with superficial vein reflux based on a DVT study in 2012 by Dr. Bonnielee Haff. Apparently Dr. Bonnielee Haff then performed laser ablation, presumably of the left superficial greater saphenous vein. He also reports having had another procedure on the left leg in the past as well. He continues to struggle with venous ulceration of the left lower leg. He does have significant upper airway tissue, was told that he has snoring by his wife, and reports some fatigue during the day. He's never had a sleep study in the past, although he does seen to be at high risk for sleep apnea. He denies any history of DVT or pulmonary embolus in the past.  He does report some shortness of breath with activities. There is a family history of varicose veins in his mother who had superficial phlebitis. He continues to wear lower extremity compression stockings on the right, and has a tightly wrapped left lower extremity.  He also reportedly has a history of stroke and is taking aspirin and Plavix. There is also hypertension is treated with Diovan HCTZ and Toprol-XL.  PMHx:  Past Medical History  Diagnosis Date  . TIA (transient ischemic attack)   . Stroke   . Kidney stones   . Hypertension   . Asbestosis     Past Surgical History  Procedure Laterality Date  . Vein surgery Left 06/2010    laser?  . Kidney stone surgery    . Vasectomy  1984    FAMHx:  Family History  Problem Relation Age of Onset  . Bone cancer Mother   . Hypertension Father   . Pancreatic cancer Maternal Grandmother   . Stroke Paternal Grandmother   . Diabetes Paternal Grandfather   . Heart attack Brother    . Hypertension Brother   . Heart attack Sister     SOCHx:   reports that he has never smoked. He has never used smokeless tobacco. He reports that he does not drink alcohol or use illicit drugs.  ALLERGIES:  No Known Allergies  ROS: A comprehensive review of systems was negative except for: Constitutional: positive for fatigue Respiratory: positive for dyspnea on exertion and snoring Cardiovascular: positive for lower extremity edema Integument/breast: positive for skin color change, skin lesion(s) and venous ulcers  HOME MEDS: Current Outpatient Prescriptions  Medication Sig Dispense Refill  . allopurinol (ZYLOPRIM) 300 MG tablet Take 300 mg by mouth daily.      Marland Kitchen aspirin 325 MG tablet Take 325 mg by mouth daily.      Marland Kitchen atorvastatin (LIPITOR) 10 MG tablet Take 10 mg by mouth daily.      . clopidogrel (PLAVIX) 75 MG tablet Take 75 mg by mouth daily.      Marland Kitchen ibuprofen (ADVIL,MOTRIN) 200 MG tablet Take 800 mg by mouth every 6 (six) hours as needed for pain.      . metoprolol succinate (TOPROL-XL) 100 MG 24 hr tablet Take 100 mg by mouth daily. Take with or immediately following a meal.      . Sulfamethoxazole-Trimethoprim (BACTRIM PO) Take by mouth daily.      . valsartan-hydrochlorothiazide (DIOVAN-HCT) 320-25 MG per tablet Take  1 tablet by mouth daily.       No current facility-administered medications for this visit.    LABS/IMAGING: No results found for this or any previous visit (from the past 48 hour(s)). No results found.  VITALS: BP 150/92  Ht 5' 10.5" (1.791 m)  Wt 351 lb 14.4 oz (159.621 kg)  BMI 49.76 kg/m2  EXAM: General appearance: alert and no distress Neck: no carotid bruit and no JVD Lungs: diminished breath sounds bilaterally Heart: regular rate and rhythm, S1, S2 normal, no murmur, click, rub or gallop Abdomen: morbidly obese, protuberant Extremities: edema 1+ RLE edema, LLE wrapped, there are numerous VE ulcers of the LLE Pulses: 1+ pulses Skin:  hemosiderin deposition on both lower extremities, reticular veins Neurologic: Mental status: Alert and oriented, appears to be a little slow of thought Psych: Pleasant, affable  EKG: deferred  ASSESSMENT: 1. Chronic bilateral venous stasis edema, CEAP 1,2,3,4A, 6 (ON LEFT) 2. Morbid obesity with a thick neck 3. Snoring, daytime somnolence and dyspnea on exertion-suspicious for sleep apnea 4. 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   [name] has significant bilateral venous reflux disease. His symptoms are much worse on the left indicating ulceration, hemosiderin changes, edema and very poorly healing wounds. He does have a history of endovenous ablation, I believe in 2012 to the left greater saphenous vein, however cannot find those records. It is unclear whether there are potentially any treatment options for him. He may very well have deep vein reflux. I will obtain bilateral reflux studies, specifically look for possible perforator vessels. In addition, I think he is at high risk for obstructive sleep apnea and he reports she's never had a study before. We'll go ahead and arrange for a sleep study and hopeful treatment if he does have sleep apnea. Finally I would recommend an echocardiogram to look for possible right heart enlargement, suggesting early right heart failure as a possible etiology of his abdominal obesity, large extremity-swelling and venous hypertension.  Plan to see him back in a few months to discuss the results of these studies.  Thanks again for the referral.  Chrystie Nose, MD, [facility] Attending Cardiologist [facility]  Charan Prieto C 07/22/2013, 3:09 PM

## 2013-07-23 ENCOUNTER — Telehealth: Payer: Self-pay | Admitting: Internal Medicine

## 2013-07-23 NOTE — Telephone Encounter (Signed)
Faxed information to [facility] for split night sleep study.

## 2013-07-25 DIAGNOSIS — R809 Proteinuria, unspecified: Secondary | ICD-10-CM | POA: Diagnosis not present

## 2013-07-25 DIAGNOSIS — E119 Type 2 diabetes mellitus without complications: Secondary | ICD-10-CM | POA: Diagnosis not present

## 2013-07-25 DIAGNOSIS — I6789 Other cerebrovascular disease: Secondary | ICD-10-CM | POA: Diagnosis not present

## 2013-07-25 DIAGNOSIS — Z23 Encounter for immunization: Secondary | ICD-10-CM | POA: Diagnosis not present

## 2013-07-25 DIAGNOSIS — I10 Essential (primary) hypertension: Secondary | ICD-10-CM | POA: Diagnosis not present

## 2013-07-25 DIAGNOSIS — E7800 Pure hypercholesterolemia, unspecified: Secondary | ICD-10-CM | POA: Diagnosis not present

## 2013-07-28 DIAGNOSIS — L97909 Non-pressure chronic ulcer of unspecified part of unspecified lower leg with unspecified severity: Secondary | ICD-10-CM | POA: Diagnosis not present

## 2013-07-28 DIAGNOSIS — I87319 Chronic venous hypertension (idiopathic) with ulcer of unspecified lower extremity: Secondary | ICD-10-CM | POA: Diagnosis not present

## 2013-07-30 ENCOUNTER — Inpatient Hospital Stay (HOSPITAL_COMMUNITY): Admission: RE | Admit: 2013-07-30 | Payer: 59 | Source: Ambulatory Visit

## 2013-07-31 DIAGNOSIS — G459 Transient cerebral ischemic attack, unspecified: Secondary | ICD-10-CM | POA: Diagnosis not present

## 2013-08-01 ENCOUNTER — Ambulatory Visit (HOSPITAL_COMMUNITY)
Admission: RE | Admit: 2013-08-01 | Discharge: 2013-08-01 | Disposition: A | Discharge status: Home / Self Care | Payer: Medicare Other | Source: Ambulatory Visit | Attending: Cardiovascular Disease | Admitting: Cardiovascular Disease

## 2013-08-01 DIAGNOSIS — L97909 Non-pressure chronic ulcer of unspecified part of unspecified lower leg with unspecified severity: Secondary | ICD-10-CM | POA: Diagnosis not present

## 2013-08-01 DIAGNOSIS — IMO0001 Reserved for inherently not codable concepts without codable children: Secondary | ICD-10-CM

## 2013-08-01 DIAGNOSIS — M7989 Other specified soft tissue disorders: Secondary | ICD-10-CM | POA: Diagnosis not present

## 2013-08-01 DIAGNOSIS — I83893 Varicose veins of bilateral lower extremities with other complications: Secondary | ICD-10-CM

## 2013-08-01 DIAGNOSIS — I872 Venous insufficiency (chronic) (peripheral): Secondary | ICD-10-CM | POA: Diagnosis not present

## 2013-08-01 DIAGNOSIS — I878 Other specified disorders of veins: Secondary | ICD-10-CM

## 2013-08-01 DIAGNOSIS — I87319 Chronic venous hypertension (idiopathic) with ulcer of unspecified lower extremity: Secondary | ICD-10-CM | POA: Diagnosis not present

## 2013-08-01 NOTE — Progress Notes (Signed)
Venous Duplex Lower Ext. Completed. Nyajah Hyson, BS, RDMS, RVT  

## 2013-08-04 DIAGNOSIS — I87319 Chronic venous hypertension (idiopathic) with ulcer of unspecified lower extremity: Secondary | ICD-10-CM | POA: Diagnosis not present

## 2013-08-04 DIAGNOSIS — L97909 Non-pressure chronic ulcer of unspecified part of unspecified lower leg with unspecified severity: Secondary | ICD-10-CM | POA: Diagnosis not present

## 2013-08-12 ENCOUNTER — Ambulatory Visit (HOSPITAL_COMMUNITY)
Admission: RE | Admit: 2013-08-12 | Discharge: 2013-08-12 | Disposition: A | Discharge status: Home / Self Care | Payer: Medicare Other | Source: Ambulatory Visit | Attending: Cardiovascular Disease | Admitting: Cardiovascular Disease

## 2013-08-12 DIAGNOSIS — I10 Essential (primary) hypertension: Secondary | ICD-10-CM | POA: Diagnosis not present

## 2013-08-12 DIAGNOSIS — R0600 Dyspnea, unspecified: Secondary | ICD-10-CM

## 2013-08-12 DIAGNOSIS — Z86718 Personal history of other venous thrombosis and embolism: Secondary | ICD-10-CM | POA: Diagnosis not present

## 2013-08-12 DIAGNOSIS — R0609 Other forms of dyspnea: Secondary | ICD-10-CM | POA: Diagnosis not present

## 2013-08-12 DIAGNOSIS — R0602 Shortness of breath: Secondary | ICD-10-CM

## 2013-08-12 DIAGNOSIS — R0989 Other specified symptoms and signs involving the circulatory and respiratory systems: Secondary | ICD-10-CM | POA: Insufficient documentation

## 2013-08-12 NOTE — Progress Notes (Signed)
2D Echo Performed 08/12/2013    Josefa [place], RCS  

## 2013-08-18 ENCOUNTER — Encounter (HOSPITAL_BASED_OUTPATIENT_CLINIC_OR_DEPARTMENT_OTHER): Payer: Medicare Other | Attending: General Surgery

## 2013-08-18 ENCOUNTER — Encounter: Payer: Self-pay | Admitting: Internal Medicine

## 2013-08-18 ENCOUNTER — Ambulatory Visit (INDEPENDENT_AMBULATORY_CARE_PROVIDER_SITE_OTHER): Payer: 59 | Admitting: Internal Medicine

## 2013-08-18 VITALS — BP 140/70 | HR 72 | Ht 70.5 in | Wt 353.9 lb

## 2013-08-18 DIAGNOSIS — L97909 Non-pressure chronic ulcer of unspecified part of unspecified lower leg with unspecified severity: Secondary | ICD-10-CM | POA: Diagnosis not present

## 2013-08-18 DIAGNOSIS — I83893 Varicose veins of bilateral lower extremities with other complications: Secondary | ICD-10-CM

## 2013-08-18 DIAGNOSIS — I87319 Chronic venous hypertension (idiopathic) with ulcer of unspecified lower extremity: Secondary | ICD-10-CM | POA: Insufficient documentation

## 2013-08-18 DIAGNOSIS — I890 Lymphedema, not elsewhere classified: Secondary | ICD-10-CM | POA: Insufficient documentation

## 2013-08-18 DIAGNOSIS — I83009 Varicose veins of unspecified lower extremity with ulcer of unspecified site: Secondary | ICD-10-CM

## 2013-08-18 DIAGNOSIS — IMO0001 Reserved for inherently not codable concepts without codable children: Secondary | ICD-10-CM

## 2013-08-18 NOTE — Progress Notes (Signed)
OFFICE NOTE  Chief Complaint:  Non-healing ulcers  Primary Care Physician: [name] Pandy, MD  HPI:  [name] [name] is a pleasant 57 year old morbidly obese male who is followed by [name] [name] in [place]. He's been seeing [name] [name] for wound care do 2 nonhealing venous ulcers of the left leg. According to the patient, he had been diagnosed with superficial vein reflux based on a DVT study in 2012 by [name] [name]. Apparently [name] [name] then performed laser ablation, presumably of the left superficial greater saphenous vein. He also reports having had another procedure on the left leg in the past as well. He continues to struggle with venous ulceration of the left lower leg. He does have significant upper airway tissue, was told that he has snoring by his wife, and reports some fatigue during the day. He's never had a sleep study in the past, although he does seen to be at high risk for sleep apnea. He denies any history of DVT or pulmonary embolus in the past.  He does report some shortness of breath with activities. There is a family history of varicose veins in his mother who had superficial phlebitis. He continues to wear lower extremity compression stockings on the right, and has a tightly wrapped left lower extremity.  He also reportedly has a history of stroke and is taking aspirin and Plavix. There is also hypertension is treated with Diovan HCTZ and Toprol-XL.  [name] [name] returns today for followup of his venous Dopplers and echocardiogram.  His echocardiogram actually shows fairly good systolic function with an EF of 55-60%, grade 1 diastolic dysfunction and normal right heart function.  His venous Dopplers unfortunately only shows significant reflux in the bilateral common femoral veins which is deep vein reflux. He had a very small amount of reflux in the right short saphenous vein however he does not have ulceration on the side.  There are signs of prior closure of the superficial  veins as previously described. I do not see any superficial veins which are amenable to any further treatments.  He reports that his sleep study is scheduled 2 weeks from now.  PMHx:  Past Medical History  Diagnosis Date  . TIA (transient ischemic attack)   . Stroke   . Kidney stones   . Hypertension   . Asbestosis     Past Surgical History  Procedure Laterality Date  . Vein surgery Left 06/2010    laser?  . Kidney stone surgery    . Vasectomy  1984    FAMHx:  Family History  Problem Relation Age of Onset  . Bone cancer Mother   . Hypertension Father   . Pancreatic cancer Maternal Grandmother   . Stroke Paternal Grandmother   . Diabetes Paternal Grandfather   . Heart attack Brother   . Hypertension Brother   . Heart attack Sister     SOCHx:   reports that he has never smoked. He has never used smokeless tobacco. He reports that he does not drink alcohol or use illicit drugs.  ALLERGIES:  No Known Allergies  ROS: A comprehensive review of systems was negative except for: Constitutional: positive for fatigue Respiratory: positive for dyspnea on exertion and snoring Cardiovascular: positive for lower extremity edema Integument/breast: positive for skin color change, skin lesion(s) and venous ulcers  HOME MEDS: Current Outpatient Prescriptions  Medication Sig Dispense Refill  . allopurinol (ZYLOPRIM) 300 MG tablet Take 300 mg by mouth daily.      Marland Kitchen  aspirin 325 MG tablet Take 325 mg by mouth daily.      Marland Kitchen atorvastatin (LIPITOR) 10 MG tablet Take 10 mg by mouth daily.      . clopidogrel (PLAVIX) 75 MG tablet Take 75 mg by mouth daily.      Marland Kitchen ibuprofen (ADVIL,MOTRIN) 200 MG tablet Take 800 mg by mouth every 6 (six) hours as needed for pain.      . metoprolol succinate (TOPROL-XL) 100 MG 24 hr tablet Take 100 mg by mouth daily. Take with or immediately following a meal.      . valsartan-hydrochlorothiazide (DIOVAN-HCT) 320-25 MG per tablet Take 1 tablet by mouth daily.        No current facility-administered medications for this visit.    LABS/IMAGING: No results found for this or any previous visit (from the past 48 hour(s)). No results found.  VITALS: BP 140/70  Pulse 72  Ht 5' 10.5" (1.791 m)  Wt 353 lb 14.4 oz (160.528 kg)  BMI 50.04 kg/m2  EXAM: deferred  EKG: deferred  ASSESSMENT: 1. Chronic bilateral venous stasis edema, CEAP 1,2,3,4A, 6 (ON LEFT) - no further treatment options 2. Morbid obesity with a thick neck 3. Snoring, daytime somnolence and dyspnea on exertion-suspicious for sleep apnea  PLAN: 1.   [name] [name] has significant bilateral venous reflux disease. His symptoms are much worse on the left indicating ulceration, hemosiderin changes, edema and very poorly healing wounds. He does have a history of endovenous ablation, I believe in 2012 to the left greater saphenous vein, however cannot find those records. His Dopplers demonstrate prior closure and there are no superficial veins available for ablation. Unfortunately he has bilateral deep vein reflux for which no treatment options are available.  He is scheduled for a sleep study as he is at high risk for sleep apnea.  If this is abnormal and he can tolerate treatment with CPAP, this may help with some of his venous hypertension.   Plan to see him back as needed.  Thanks again for the referral.  Chrystie Nose, MD, [facility] Attending Cardiologist [facility]  Vedanth Sirico C 08/18/2013, 11:41 AM

## 2013-08-18 NOTE — Patient Instructions (Signed)
Your physician recommends that you schedule a follow-up appointment AS Needed  

## 2013-08-25 DIAGNOSIS — I87319 Chronic venous hypertension (idiopathic) with ulcer of unspecified lower extremity: Secondary | ICD-10-CM | POA: Diagnosis not present

## 2013-08-25 DIAGNOSIS — I890 Lymphedema, not elsewhere classified: Secondary | ICD-10-CM | POA: Diagnosis not present

## 2013-08-25 DIAGNOSIS — L97909 Non-pressure chronic ulcer of unspecified part of unspecified lower leg with unspecified severity: Secondary | ICD-10-CM | POA: Diagnosis not present

## 2013-08-31 DIAGNOSIS — Z6841 Body Mass Index (BMI) 40.0 and over, adult: Secondary | ICD-10-CM | POA: Diagnosis not present

## 2013-08-31 DIAGNOSIS — G4733 Obstructive sleep apnea (adult) (pediatric): Secondary | ICD-10-CM | POA: Diagnosis not present

## 2013-08-31 DIAGNOSIS — Z8673 Personal history of transient ischemic attack (TIA), and cerebral infarction without residual deficits: Secondary | ICD-10-CM | POA: Diagnosis not present

## 2013-09-01 DIAGNOSIS — I87319 Chronic venous hypertension (idiopathic) with ulcer of unspecified lower extremity: Secondary | ICD-10-CM | POA: Diagnosis not present

## 2013-09-01 DIAGNOSIS — L97909 Non-pressure chronic ulcer of unspecified part of unspecified lower leg with unspecified severity: Secondary | ICD-10-CM | POA: Diagnosis not present

## 2013-09-01 DIAGNOSIS — I890 Lymphedema, not elsewhere classified: Secondary | ICD-10-CM | POA: Diagnosis not present

## 2013-09-08 DIAGNOSIS — I890 Lymphedema, not elsewhere classified: Secondary | ICD-10-CM | POA: Diagnosis not present

## 2013-09-08 DIAGNOSIS — I87319 Chronic venous hypertension (idiopathic) with ulcer of unspecified lower extremity: Secondary | ICD-10-CM | POA: Diagnosis not present

## 2013-09-08 DIAGNOSIS — L97909 Non-pressure chronic ulcer of unspecified part of unspecified lower leg with unspecified severity: Secondary | ICD-10-CM | POA: Diagnosis not present

## 2013-09-15 DIAGNOSIS — I87319 Chronic venous hypertension (idiopathic) with ulcer of unspecified lower extremity: Secondary | ICD-10-CM | POA: Diagnosis not present

## 2013-09-15 DIAGNOSIS — L97909 Non-pressure chronic ulcer of unspecified part of unspecified lower leg with unspecified severity: Secondary | ICD-10-CM | POA: Diagnosis not present

## 2013-09-15 DIAGNOSIS — I890 Lymphedema, not elsewhere classified: Secondary | ICD-10-CM | POA: Diagnosis not present

## 2013-09-19 ENCOUNTER — Encounter: Payer: Self-pay | Admitting: Internal Medicine

## 2013-09-22 ENCOUNTER — Encounter (HOSPITAL_BASED_OUTPATIENT_CLINIC_OR_DEPARTMENT_OTHER): Payer: Medicare Other | Attending: General Surgery

## 2013-09-22 DIAGNOSIS — L97909 Non-pressure chronic ulcer of unspecified part of unspecified lower leg with unspecified severity: Secondary | ICD-10-CM | POA: Diagnosis not present

## 2013-09-22 DIAGNOSIS — I87339 Chronic venous hypertension (idiopathic) with ulcer and inflammation of unspecified lower extremity: Secondary | ICD-10-CM | POA: Insufficient documentation

## 2013-10-01 DIAGNOSIS — L97909 Non-pressure chronic ulcer of unspecified part of unspecified lower leg with unspecified severity: Secondary | ICD-10-CM | POA: Diagnosis not present

## 2013-10-01 DIAGNOSIS — I87339 Chronic venous hypertension (idiopathic) with ulcer and inflammation of unspecified lower extremity: Secondary | ICD-10-CM | POA: Diagnosis not present

## 2013-10-06 DIAGNOSIS — I87339 Chronic venous hypertension (idiopathic) with ulcer and inflammation of unspecified lower extremity: Secondary | ICD-10-CM | POA: Diagnosis not present

## 2013-10-06 DIAGNOSIS — L97909 Non-pressure chronic ulcer of unspecified part of unspecified lower leg with unspecified severity: Secondary | ICD-10-CM | POA: Diagnosis not present

## 2013-10-13 DIAGNOSIS — I87339 Chronic venous hypertension (idiopathic) with ulcer and inflammation of unspecified lower extremity: Secondary | ICD-10-CM | POA: Diagnosis not present

## 2013-10-13 DIAGNOSIS — L97909 Non-pressure chronic ulcer of unspecified part of unspecified lower leg with unspecified severity: Secondary | ICD-10-CM | POA: Diagnosis not present

## 2013-10-15 DIAGNOSIS — G4733 Obstructive sleep apnea (adult) (pediatric): Secondary | ICD-10-CM | POA: Diagnosis not present

## 2013-10-20 ENCOUNTER — Encounter (HOSPITAL_BASED_OUTPATIENT_CLINIC_OR_DEPARTMENT_OTHER): Payer: Medicare Other | Attending: General Surgery

## 2013-10-20 DIAGNOSIS — L97909 Non-pressure chronic ulcer of unspecified part of unspecified lower leg with unspecified severity: Secondary | ICD-10-CM | POA: Diagnosis not present

## 2013-10-20 DIAGNOSIS — L03119 Cellulitis of unspecified part of limb: Secondary | ICD-10-CM | POA: Diagnosis not present

## 2013-10-20 DIAGNOSIS — I87339 Chronic venous hypertension (idiopathic) with ulcer and inflammation of unspecified lower extremity: Secondary | ICD-10-CM | POA: Diagnosis not present

## 2013-10-20 DIAGNOSIS — L02419 Cutaneous abscess of limb, unspecified: Secondary | ICD-10-CM | POA: Diagnosis not present

## 2013-10-27 DIAGNOSIS — I87339 Chronic venous hypertension (idiopathic) with ulcer and inflammation of unspecified lower extremity: Secondary | ICD-10-CM | POA: Diagnosis not present

## 2013-10-27 DIAGNOSIS — L03119 Cellulitis of unspecified part of limb: Secondary | ICD-10-CM | POA: Diagnosis not present

## 2013-10-27 DIAGNOSIS — L97909 Non-pressure chronic ulcer of unspecified part of unspecified lower leg with unspecified severity: Secondary | ICD-10-CM | POA: Diagnosis not present

## 2013-10-27 DIAGNOSIS — L02419 Cutaneous abscess of limb, unspecified: Secondary | ICD-10-CM | POA: Diagnosis not present

## 2013-11-10 DIAGNOSIS — L97909 Non-pressure chronic ulcer of unspecified part of unspecified lower leg with unspecified severity: Secondary | ICD-10-CM | POA: Diagnosis not present

## 2013-11-10 DIAGNOSIS — L02419 Cutaneous abscess of limb, unspecified: Secondary | ICD-10-CM | POA: Diagnosis not present

## 2013-11-10 DIAGNOSIS — I87339 Chronic venous hypertension (idiopathic) with ulcer and inflammation of unspecified lower extremity: Secondary | ICD-10-CM | POA: Diagnosis not present

## 2013-11-10 DIAGNOSIS — L03119 Cellulitis of unspecified part of limb: Secondary | ICD-10-CM | POA: Diagnosis not present

## 2013-11-17 ENCOUNTER — Encounter (HOSPITAL_BASED_OUTPATIENT_CLINIC_OR_DEPARTMENT_OTHER): Payer: Medicare Other | Attending: General Surgery

## 2013-11-17 DIAGNOSIS — L97809 Non-pressure chronic ulcer of other part of unspecified lower leg with unspecified severity: Secondary | ICD-10-CM | POA: Diagnosis not present

## 2013-11-17 DIAGNOSIS — L03119 Cellulitis of unspecified part of limb: Secondary | ICD-10-CM

## 2013-11-17 DIAGNOSIS — I872 Venous insufficiency (chronic) (peripheral): Secondary | ICD-10-CM | POA: Diagnosis not present

## 2013-11-17 DIAGNOSIS — I890 Lymphedema, not elsewhere classified: Secondary | ICD-10-CM | POA: Diagnosis not present

## 2013-11-17 DIAGNOSIS — L02419 Cutaneous abscess of limb, unspecified: Secondary | ICD-10-CM | POA: Diagnosis not present

## 2013-11-18 DIAGNOSIS — Z79899 Other long term (current) drug therapy: Secondary | ICD-10-CM | POA: Diagnosis not present

## 2013-11-18 DIAGNOSIS — I10 Essential (primary) hypertension: Secondary | ICD-10-CM | POA: Diagnosis not present

## 2013-11-18 DIAGNOSIS — E119 Type 2 diabetes mellitus without complications: Secondary | ICD-10-CM | POA: Diagnosis not present

## 2013-11-18 DIAGNOSIS — L03119 Cellulitis of unspecified part of limb: Secondary | ICD-10-CM | POA: Diagnosis not present

## 2013-11-18 DIAGNOSIS — I872 Venous insufficiency (chronic) (peripheral): Secondary | ICD-10-CM | POA: Diagnosis not present

## 2013-11-18 DIAGNOSIS — I6789 Other cerebrovascular disease: Secondary | ICD-10-CM | POA: Diagnosis not present

## 2013-11-18 DIAGNOSIS — L02419 Cutaneous abscess of limb, unspecified: Secondary | ICD-10-CM | POA: Diagnosis not present

## 2013-11-18 DIAGNOSIS — E7800 Pure hypercholesterolemia, unspecified: Secondary | ICD-10-CM | POA: Diagnosis not present

## 2013-11-18 DIAGNOSIS — Z7982 Long term (current) use of aspirin: Secondary | ICD-10-CM | POA: Diagnosis not present

## 2013-11-18 DIAGNOSIS — I69959 Hemiplegia and hemiparesis following unspecified cerebrovascular disease affecting unspecified side: Secondary | ICD-10-CM | POA: Diagnosis not present

## 2013-11-18 DIAGNOSIS — L97809 Non-pressure chronic ulcer of other part of unspecified lower leg with unspecified severity: Secondary | ICD-10-CM | POA: Diagnosis not present

## 2013-11-18 DIAGNOSIS — Z6841 Body Mass Index (BMI) 40.0 and over, adult: Secondary | ICD-10-CM | POA: Diagnosis not present

## 2013-11-18 DIAGNOSIS — J61 Pneumoconiosis due to asbestos and other mineral fibers: Secondary | ICD-10-CM | POA: Diagnosis present

## 2013-11-18 DIAGNOSIS — Z7902 Long term (current) use of antithrombotics/antiplatelets: Secondary | ICD-10-CM | POA: Diagnosis not present

## 2013-11-18 NOTE — Progress Notes (Signed)
[facility]  NAME:  [name] [name], [name] [name]              ACCOUNT NO.:  [id number]  MEDICAL RECORD NO.:  [id number]      DATE OF BIRTH:  July 23, 1956  PHYSICIAN:  Irene Limbo, MD    VISIT DATE:  11/17/2013                                  OFFICE VISIT   CHIEF COMPLAINT:  Follow up of bilateral venous ulcerations of lower extremities.  HISTORY OF PRESENT ILLNESS:  The patient is a 58 year old male with history of asbestos exposure and obesity and chronic edema of bilateral lower extremities.  He has a history significant for previous left-sided greater saphenous vein ablation.  The review of the chart indicates he has reflux over the right venous system in both the calves and over his greater saphenous vein.  There is reflux noted in the left calf, however, the left greater saphenous vein has been ablated.  The patient has been previously in layered compression wraps; however, he developed a secondary ulceration as the wrap slid down. Over the last week, he has been in ACE wraps alone over his left lower extremity.  He presents with a new wound over his right lower extremity, which has been treated with compression stockings.  PHYSICAL EXAMINATION:  The patient has superficial ulceration over the right distal leg.  The left leg reveals multiple ulcerations over the medial and anterolateral surfaces of the leg.  He has calf circumference measured today at 55 cm, which is approximately 11 cm greater than when he presented to the clinic.  He has wound measurement over the left side is measured at 5.5 x 8 x 0.2 cm, right side is measured at 1.5 x 2.8 x 0.1 cm.  After application of the topical anesthetic, selective debridement was performed with the curette to remove all the superficial slough to the entirety of the left lower extremity wounds.  Right lower extremity wound was not debrided.    A/P: Given the amount of edema of his legs as well as the cellulitis present,  which is new, we have recommended that the patient go to the emergency room for a possible admission for IV antibiotics.  The patient is reluctant to do that here at [facility] and would like to return home, and he reports that he will present to his local hospital at [facility] for evaluation.  We discussed that he should return here for his continued wound care; however, we do recommend that he should be treated with IV antibiotics for his cellulites.  Given the anticipated hospital admission, Collagen was applied to the wound followed by ACE wrap.  We will need to hold off on more significant compression until his cellulitis resolves.  Plan for follow up in 1 week's time.          ______________________________ Irene Limbo, MD     BT/MEDQ  D:  11/17/2013  T:  11/18/2013  Job:  [id number]

## 2013-11-24 DIAGNOSIS — I872 Venous insufficiency (chronic) (peripheral): Secondary | ICD-10-CM | POA: Diagnosis not present

## 2013-11-24 DIAGNOSIS — L02419 Cutaneous abscess of limb, unspecified: Secondary | ICD-10-CM | POA: Diagnosis not present

## 2013-11-24 DIAGNOSIS — I890 Lymphedema, not elsewhere classified: Secondary | ICD-10-CM | POA: Diagnosis not present

## 2013-11-24 DIAGNOSIS — L03119 Cellulitis of unspecified part of limb: Secondary | ICD-10-CM | POA: Diagnosis not present

## 2013-11-24 DIAGNOSIS — L97809 Non-pressure chronic ulcer of other part of unspecified lower leg with unspecified severity: Secondary | ICD-10-CM | POA: Diagnosis not present

## 2013-11-25 NOTE — Progress Notes (Signed)
[facility]  NAME:  GERRAD, WELKER                   ACCOUNT NO.:  MEDICAL RECORD NO.:  [id number]      DATE OF BIRTH:  11-19-1955  PHYSICIAN:  Irene Limbo, MD    VISIT DATE:  11/24/2013                                  OFFICE VISIT   CHIEF COMPLAINT:  Followup with bilateral venous extremity ulceration.  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  At his last visit, he was noted to have significant cellulitis and had 2 day hospital admission.  He has been discharged on Bactrim.  Since his last visit, he was maintained on ACE wrap given the cellulitis appearance of his legs.  He was also discharged with Lasix and potassium supplementation, which he has not taken since the time of discharge as he would like discuss with his primary care physician.  He is scheduled to see this doctor tomorrow.  Of note, the patient has a history of left GSV ablation and reflux noted in bilateral calves and right-sided GSV ulceration.  PHYSICAL EXAMINATION:  He has improved cellulitis over left lower extremity.  There are several clusters of wounds over the left lower extremity, largest wound is measured at 1.8 x 3.4 x 0.2 cm, however, there is cluster of superficial ulcerations over the anterior and lateral surface.  Over the right lower extremity, there is a single wound that appears to have increased in size measuring 1.9 x 2.8x 0.1 cm.  Right calf circumference is 52.5 cm, right ankle 36 cm, left calf 50.8 cm, left ankle is 31 cm.  ASSESSMENT:  After application of topical anesthetic curette was used to remove the superficial slough over the entirety of the two named wounds noted above.  The patient tolerated the procedure well.  We will plan to return to layered compression wraps this week with silver alginate  over the base of the wound.  We will plan for follow within one weeks time.          ______________________________ Irene Limbo, MD MBA     BT/MEDQ  D:  11/24/2013  T:  11/25/2013  Job:  [id number]

## 2013-12-01 DIAGNOSIS — I872 Venous insufficiency (chronic) (peripheral): Secondary | ICD-10-CM | POA: Diagnosis not present

## 2013-12-01 DIAGNOSIS — L97809 Non-pressure chronic ulcer of other part of unspecified lower leg with unspecified severity: Secondary | ICD-10-CM | POA: Diagnosis not present

## 2013-12-01 DIAGNOSIS — L03119 Cellulitis of unspecified part of limb: Secondary | ICD-10-CM | POA: Diagnosis not present

## 2013-12-01 DIAGNOSIS — I890 Lymphedema, not elsewhere classified: Secondary | ICD-10-CM | POA: Diagnosis not present

## 2013-12-01 DIAGNOSIS — L02419 Cutaneous abscess of limb, unspecified: Secondary | ICD-10-CM | POA: Diagnosis not present

## 2013-12-02 NOTE — Progress Notes (Signed)
[facility]  [name] [name], [name] [name]              ACCOUNT NO.:  [id number]  MEDICAL RECORD NO.:  [id number]      DATE OF BIRTH:  12/24/1955  PHYSICIAN:  Irene Limbo, MD    VISIT DATE:  12/01/2013                                  OFFICE VISIT   CHIEF COMPLAINT:  Followup of bilateral lower extremity venous ulceration.  HISTORY OF PRESENT ILLNESS:  The patient is a 57 year old, obese male with history of chronic edema in bilateral lower extremities here for followup bilateral lower extremity ulcerations.  He is still on rifampin and Bactrim for a treatment of cellulitis of the left lower extremity with possible admission 2 weeks ago.  He reports that he was discharged on Lasix and has not taken this.  He was seen in followup with his primary care physician who recommended take it only as needed.  He has been in Profore layered compression wraps with silver alginate for the last week.  EXAMINATION:  Blood pressure is 154/93, pulse is 54, temperature is 97.6.  No ankle or calf measurements were taken today.  Left medial lower extremity ulceration is measured as 2.5 x 5 x 0.1 cm.  This is wound incurred from a previous wrap.  After application of topical anesthetic,  selective debridement were performed to remove all the superficial slough.  Over the left anterior lower extremity is a new wound measured at 5.9 x 3.1 x 0.1 cm.  This is very superficial in nature and selective debridement was performed to remove all the superficial slough.  The right lateral lower extremity wound is measured as 1.5 x 2.3 x 0.1 cm, it is slightly contracted since last visit. There is slough present over the base of the wound and this was removed with a curette for selective debridement of its entirety.  ASSESSMENT:  We will switch to collagen and continue with layered compression wraps over bilateral lower extremities.          ______________________________ Irene Limbo, MD MBA     BT/MEDQ  D:  12/01/2013  T:  12/02/2013  Job:  [id number]

## 2013-12-03 DIAGNOSIS — Z8673 Personal history of transient ischemic attack (TIA), and cerebral infarction without residual deficits: Secondary | ICD-10-CM | POA: Diagnosis not present

## 2013-12-03 DIAGNOSIS — I10 Essential (primary) hypertension: Secondary | ICD-10-CM | POA: Diagnosis not present

## 2013-12-03 DIAGNOSIS — Z7902 Long term (current) use of antithrombotics/antiplatelets: Secondary | ICD-10-CM | POA: Diagnosis not present

## 2013-12-03 DIAGNOSIS — IMO0002 Reserved for concepts with insufficient information to code with codable children: Secondary | ICD-10-CM | POA: Diagnosis not present

## 2013-12-03 DIAGNOSIS — Z79899 Other long term (current) drug therapy: Secondary | ICD-10-CM | POA: Diagnosis not present

## 2013-12-03 DIAGNOSIS — R221 Localized swelling, mass and lump, neck: Secondary | ICD-10-CM | POA: Diagnosis not present

## 2013-12-03 DIAGNOSIS — Z7982 Long term (current) use of aspirin: Secondary | ICD-10-CM | POA: Diagnosis not present

## 2013-12-03 DIAGNOSIS — R220 Localized swelling, mass and lump, head: Secondary | ICD-10-CM | POA: Diagnosis not present

## 2013-12-08 DIAGNOSIS — L97809 Non-pressure chronic ulcer of other part of unspecified lower leg with unspecified severity: Secondary | ICD-10-CM | POA: Diagnosis not present

## 2013-12-08 DIAGNOSIS — L02419 Cutaneous abscess of limb, unspecified: Secondary | ICD-10-CM | POA: Diagnosis not present

## 2013-12-08 DIAGNOSIS — I872 Venous insufficiency (chronic) (peripheral): Secondary | ICD-10-CM | POA: Diagnosis not present

## 2013-12-08 DIAGNOSIS — L03119 Cellulitis of unspecified part of limb: Secondary | ICD-10-CM | POA: Diagnosis not present

## 2013-12-08 DIAGNOSIS — I890 Lymphedema, not elsewhere classified: Secondary | ICD-10-CM | POA: Diagnosis not present

## 2013-12-09 NOTE — Progress Notes (Signed)
[facility]  NAME:  [name] [name], [name] [name]              ACCOUNT NO.:  [id number]  MEDICAL RECORD NO.:  [id number]      DATE OF BIRTH:  1956/08/12  PHYSICIAN:  Irene Limbo, MD    VISIT DATE:  12/08/2013                                  OFFICE VISIT   The patient is here for followup of bilateral lower extremity venous ulceration with lymphedema.  HISTORY OF PRESENT ILLNESS:  The patient is a 58 year old obese male with chronic edema in bilateral lower extremities.  He uses his wife's lymphedema pump.  He has been in Profore wraps bilaterally.  He has completed his rifampin and Bactrim for a treatment of cellulitis of left lower extremity.  Over the past week, he has had 2 separate falls.  His current wound care has been collagen with layered compression wraps.  PHYSICAL EXAMINATION:  VITAL SIGNS:  Blood pressure is 149/85, pulse is 60, temperature is 98.3. EXTREMITIES:  Left calf measures 46.5 cm, left ankle is 29.5 cm, right calf measures 45.5 cm, right ankle is 33 cm.  Left medial lower extremity wound is measured as 3 x 3.5 x 0.1 cm.  Left anterior lower extremity wound is measured at 6.4 x 2.7 x 0.1 cm.  There is  minimal change in total area since his last visit.  After application of topical anesthetic, selective debridement was performed to the entirety of both wounds to remove all slough with currette. Over the right lower extremity, he has open wound measured as 1.8 x 1.7 x 0.1 cm.  There is evidence of neo-epithelialization within the center of the wound and scant slough present.  We will plan to order Juxta-Fit this week as he would be suitable for using this over his right lower extremity.  In the interim, we will continue with the current wound care, and plan for followup in 1 week's time.          ______________________________ Irene Limbo, MD MBA     BT/MEDQ  D:  12/08/2013  T:  12/09/2013  Job:  [id number]

## 2013-12-15 DIAGNOSIS — I872 Venous insufficiency (chronic) (peripheral): Secondary | ICD-10-CM | POA: Diagnosis not present

## 2013-12-15 DIAGNOSIS — L03119 Cellulitis of unspecified part of limb: Secondary | ICD-10-CM | POA: Diagnosis not present

## 2013-12-15 DIAGNOSIS — L02419 Cutaneous abscess of limb, unspecified: Secondary | ICD-10-CM | POA: Diagnosis not present

## 2013-12-15 DIAGNOSIS — I890 Lymphedema, not elsewhere classified: Secondary | ICD-10-CM | POA: Diagnosis not present

## 2013-12-15 DIAGNOSIS — L97809 Non-pressure chronic ulcer of other part of unspecified lower leg with unspecified severity: Secondary | ICD-10-CM | POA: Diagnosis not present

## 2013-12-16 ENCOUNTER — Telehealth: Payer: Self-pay | Admitting: Internal Medicine

## 2013-12-16 NOTE — Telephone Encounter (Signed)
Calling to tell us where to send the information from the sleep study so that he can get his c-pap machine from [facility]  . Please call if you have any questions @ [phone]   Thanks

## 2013-12-16 NOTE — Telephone Encounter (Signed)
Jenna Dr. Debara Pickett ordered this sleep study on this patient. Looks like this was done @ [facility] and the results were scanned into his chart. The patient would like to be referred to [facility].

## 2013-12-16 NOTE — Progress Notes (Signed)
[facility]  NAME:  [name] [name], PEPPERMAN                   ACCOUNT NO.:  MEDICAL RECORD NO.:  [id number]      DATE OF BIRTH:  [birth year]  PHYSICIAN:  Irene Limbo, MD    VISIT DATE:  12/15/2013                                  OFFICE VISIT   CHIEF COMPLAINT:  The patient is here for followup of bilateral lower extremity ulcerations in the setting of venous stasis and lymphedema.  HISTORY OF PRESENT ILLNESS:  The patient is a 58 year old obese male with chronic edema.  He uses his wife's lymphedema pump.  He has been in Profore wraps bilaterally.  PHYSICAL EXAMINATION:  VITAL SIGNS:  Blood pressure is 177/94, pulse is 72, temperature is 97.8. MUSCULOSKELETAL:  Right lower extremity: he has completely healed his Wounds. Over the left lower extremity, he has remaining open wound over the medial leg measured at 2.9 x 1 x 0.1 cm.  The left anterior lower extremity is measured as 1.3 x 0.4 x 0.1 cm.  He has a new wound over the medial and proximal left lower extremity measured at 1.8 x 1.6 x 0.1 cm. Right calf circumference is 44 cm, right ankle is 31 cm, left calf is 45 cm, left ankle is 28 cm; both of these have improved since last week.  ASSESSMENT AND PLAN:  Of the 2 prior wounds present, there is a significant contraction over his last week.  After application of topical anesthetic, selective debridement was performed to the entirety of all the wounds removed was superficial.  The patient tolerated this well.  We will continue with silver alginate to the right lower extremity wounds followed by layered compression wrap.  His left lower extremity is healed and the patient has brought his compression stocking with him today to be placed over the wound.  Follow up in 1 week's time.          ______________________________ Irene Limbo, MD MBA     BT/MEDQ  D:  12/15/2013  T:  12/16/2013  Job:  [id number]

## 2013-12-17 NOTE — Telephone Encounter (Signed)
Call to [place] [phone]

## 2013-12-18 NOTE — Telephone Encounter (Signed)
Faxed to [facility] CPAP order/supply order at [phone]

## 2013-12-20 DIAGNOSIS — E7800 Pure hypercholesterolemia, unspecified: Secondary | ICD-10-CM | POA: Diagnosis not present

## 2013-12-20 DIAGNOSIS — E119 Type 2 diabetes mellitus without complications: Secondary | ICD-10-CM | POA: Diagnosis not present

## 2013-12-20 DIAGNOSIS — R609 Edema, unspecified: Secondary | ICD-10-CM | POA: Diagnosis not present

## 2013-12-20 DIAGNOSIS — I10 Essential (primary) hypertension: Secondary | ICD-10-CM | POA: Diagnosis not present

## 2013-12-20 DIAGNOSIS — L03818 Cellulitis of other sites: Secondary | ICD-10-CM | POA: Diagnosis not present

## 2013-12-20 DIAGNOSIS — L02818 Cutaneous abscess of other sites: Secondary | ICD-10-CM | POA: Diagnosis not present

## 2013-12-22 ENCOUNTER — Encounter (HOSPITAL_BASED_OUTPATIENT_CLINIC_OR_DEPARTMENT_OTHER): Payer: Medicare Other | Attending: Plastic Surgery

## 2013-12-22 DIAGNOSIS — L97809 Non-pressure chronic ulcer of other part of unspecified lower leg with unspecified severity: Secondary | ICD-10-CM | POA: Diagnosis not present

## 2013-12-22 DIAGNOSIS — I890 Lymphedema, not elsewhere classified: Secondary | ICD-10-CM | POA: Insufficient documentation

## 2013-12-22 DIAGNOSIS — E669 Obesity, unspecified: Secondary | ICD-10-CM | POA: Insufficient documentation

## 2013-12-22 DIAGNOSIS — I872 Venous insufficiency (chronic) (peripheral): Secondary | ICD-10-CM | POA: Diagnosis not present

## 2013-12-23 NOTE — Progress Notes (Signed)
[facility]  NAME:  [name] [name], [name] [name]              ACCOUNT NO.:  [id number]  MEDICAL RECORD NO.:  [id number]      DATE OF BIRTH:  1956-03-18  PHYSICIAN:  Irene Limbo, MD    VISIT DATE:  12/22/2013                                  OFFICE VISIT   CHIEF COMPLAINT:  The patient is here for followup of lower extremities ulceration in the setting of venous stasis and lymphedema.  HISTORY OF PRESENT ILLNESS:  The patient is a 58 year old obese male with chronic edema.  He has been in Profore wrap.  EXAMINATION:  VITAL SIGNS:  Blood pressure is 160/81, pulse 53, temperature is 98.1. EXTREMITIES:  Left medial lower extremity ulceration is measured as 1.8 x 1 x 0.1 cm.  Left anterior lower extremity is measured at 0.6 x 0.5 x 0.1, and left medial proximal lower extremity is measured at 0.6 x 0.4 x 0.2 cm.  His right lower extremity remains completely healed.  After application of topical anesthetic, selective debridement was performed with curette to the entirety of the left medial and proximal wounds to remove all the superficial slough and fibrin.  We will plan to continue with Profore wrap and silver alginate to all of his wounds and plan for followup in 1 week's time.  He is using his Juxta-Fit over his opposite lower extremity.          ______________________________ Irene Limbo, MD MBA     BT/MEDQ  D:  12/22/2013  T:  12/23/2013  Job:  [id number]

## 2013-12-29 DIAGNOSIS — L97809 Non-pressure chronic ulcer of other part of unspecified lower leg with unspecified severity: Secondary | ICD-10-CM | POA: Diagnosis not present

## 2013-12-29 DIAGNOSIS — I872 Venous insufficiency (chronic) (peripheral): Secondary | ICD-10-CM | POA: Diagnosis not present

## 2013-12-29 DIAGNOSIS — E669 Obesity, unspecified: Secondary | ICD-10-CM | POA: Diagnosis not present

## 2013-12-29 DIAGNOSIS — I890 Lymphedema, not elsewhere classified: Secondary | ICD-10-CM | POA: Diagnosis not present

## 2013-12-30 NOTE — Progress Notes (Signed)
[facility]  NAME:  [name] [name], [name] [name]              ACCOUNT NO.:  [id number]  MEDICAL RECORD NO.:  [id number]      DATE OF BIRTH:  28-Jan-1956  PHYSICIAN:  Irene Limbo, MD    VISIT DATE:  12/29/2013                                  OFFICE VISIT   CHIEF COMPLAINT:  Followup of left lower extremity ulcerations in the setting of lymphedema.  The patient presents for followup.  He has been in silver alginate with a Profore wrap.  He has been tolerating these well.  He does have his Juxta-Fit present with him today.  He has been using his own compression stocking over his healed right lower extremity.  Over the left lower extremity, there are 3 named wounds.  The left medial is measured at 0.6 x 0.5 x 0.1.  The left anterior lower extremity is measured at 0.6 x 0.5 x 0.1 cm.  No debridement performed on these.  The left medial proximal wound is measured at 2.5 x 3 x 0.1 cm.  This is at the location where his layered wrap is most proximal.  There is dark discoloration of the tissue which suggestive of pressure secondary to the wraps.  After application of topical anesthetic, scissors were used to remove the necrotic skin and subcutaneous tissue to bleeding underlying tissue. Total area for debridement was 2 cm2.  The left calf circumference measures 42 cm. The left ankle is 25 cm.    We will plan to continue with silver alginate.  However, we will switch to his Juxta-Fit at this time, given that his layered compression wrap appears to have caused some secondary ulceration.          ______________________________ Irene Limbo, MD MBA     BT/MEDQ  D:  12/29/2013  T:  12/30/2013  Job:  [id number]

## 2014-01-05 DIAGNOSIS — E669 Obesity, unspecified: Secondary | ICD-10-CM | POA: Diagnosis not present

## 2014-01-05 DIAGNOSIS — I872 Venous insufficiency (chronic) (peripheral): Secondary | ICD-10-CM | POA: Diagnosis not present

## 2014-01-05 DIAGNOSIS — L97809 Non-pressure chronic ulcer of other part of unspecified lower leg with unspecified severity: Secondary | ICD-10-CM | POA: Diagnosis not present

## 2014-01-05 DIAGNOSIS — I890 Lymphedema, not elsewhere classified: Secondary | ICD-10-CM | POA: Diagnosis not present

## 2014-01-06 NOTE — Progress Notes (Signed)
[facility]  NAME:  [name] [name], [name] [name]              ACCOUNT NO.:  [id number]  MEDICAL RECORD NO.:  [id number]      DATE OF BIRTH:  04-16-1956  PHYSICIAN:  Irene Limbo, MD    VISIT DATE:  01/05/2014                                  OFFICE VISIT   CHIEF COMPLAINT:  Followup of left lower extremity ulceration in the setting of lymphedema.  The patient is here for followup of left lower extremity wound.  His current wound care has been silver alginate and we switched him to Juxta- Fit compression last week.  He reports that he did not have any abdominal pads available and had been flipping these over and re-using them.  We will plan to order supplies today.  He is tolerating the [place] well.  He does note that the silver alginate was dry and  adherent on some days of dressing changes.  PHYSICAL EXAMINATION:  VITAL SIGNS:  Blood pressure is 168/89, pulse is 54, temperature is 97.5. WOUND EXAM:  Left calf measures 46 cm, left ankle is 26 cm, this is slightly increased from his last visit.  Wounds, left medial lower extremity is measured as 2.2 x 2.1 x 0.1 cm.  Left anterior lower extremity is measured at 0.6 x 0.4 x 0.1 cm, and the left medial proximal lower extremity is measured as 1.5 x 2.1 x 0.1 cm.  After application of topical anesthetic, selective debridement was performed to the entirety of all of the wounds to remove the superficial slough.  The patient tolerated the procedure well.  We will switch to calcium alginate this week. Patient is going to wash the sock portion of his Juxta-Fit and we placed regular tube gauzing today.  The patient does have Tubigrip at home which he will use until he washes his Juxta-Fit sock.  Plan for followup in 1 week time.          ______________________________ Irene Limbo, MD MBA     BT/MEDQ  D:  01/05/2014  T:  01/06/2014  Job:  [id number]

## 2014-01-19 ENCOUNTER — Encounter (HOSPITAL_BASED_OUTPATIENT_CLINIC_OR_DEPARTMENT_OTHER): Payer: Medicare Other | Attending: Plastic Surgery

## 2014-01-19 DIAGNOSIS — I890 Lymphedema, not elsewhere classified: Secondary | ICD-10-CM | POA: Diagnosis not present

## 2014-01-19 DIAGNOSIS — L97809 Non-pressure chronic ulcer of other part of unspecified lower leg with unspecified severity: Secondary | ICD-10-CM | POA: Diagnosis not present

## 2014-01-19 DIAGNOSIS — I872 Venous insufficiency (chronic) (peripheral): Secondary | ICD-10-CM | POA: Insufficient documentation

## 2014-01-20 NOTE — Progress Notes (Signed)
[facility]  NAME:  [name] [name], [name] [name]                   ACCOUNT NO.:  MEDICAL RECORD NO.:  [id number]      DATE OF BIRTH:  12-Aug-1956  PHYSICIAN:  Irene Limbo, MD    VISIT DATE:  01/19/2014                                  OFFICE VISIT   CHIEF COMPLAINT:  Left lower extremity ulcerations in the setting of venous stasis and lymphedema.  The patient is here for followup of left lower extremity ulcerations. At his last visit, he was noted to have quite dry wound with adherent dressings and he was switched from silver to calcium alginate.  He has gone from layered compression to his Juxta-Fit and he has been tolerating this well.  He states that he did have supplies delivered including abdominal pads and calcium alginate.  PHYSICAL EXAMINATION:  Blood pressure is 165/95, pulse is 59, temperature is 97.8.  He has a new wound over his left lower extremity for a total of four named wounds.  The left medial lower extremity is measured as 1.3 x 0.5 x 0.1 cm.  The left anterior lower extremity is measured at 2.7 x 1.5 x 0.1 cm.  The left medial proximal lower extremity is measured at 0.7 x 1.5 x 0.1 cm and the left posterior lower extremity is measured at 3.2 x 3 x 0.1 cm.  Despite the new posterior wound that is present, he has had contraction of the other wounds.  The left calf is measured at 46 cm, left ankle is 26.5 cm.  This is similar to his last week's visit, though increased compared to his layered compression wraps.  Examination of his previous dressings include very dry alginate dressings with minimal dressing in place.  The patient is changing his dressings every other day, on the day of his shower.  Given all of the above, we will switch to silver collagen with abdominal pads followed by the Juxta-Fit compression.  I am concerned that some of his dressings are drying out and are creating secondary wounds or debriding some of the newly formed  skin.  Plan for followup in 1 week's time.  We will hold off on ordering the silver collagen at this time until we know that he is tolerating this well.          ______________________________ Irene Limbo, MD MBA     BT/MEDQ  D:  01/19/2014  T:  01/20/2014  Job:  [id number]

## 2014-01-26 ENCOUNTER — Encounter (HOSPITAL_BASED_OUTPATIENT_CLINIC_OR_DEPARTMENT_OTHER): Payer: Medicare Other | Attending: Plastic Surgery

## 2014-01-26 DIAGNOSIS — I890 Lymphedema, not elsewhere classified: Secondary | ICD-10-CM | POA: Insufficient documentation

## 2014-01-26 DIAGNOSIS — L97809 Non-pressure chronic ulcer of other part of unspecified lower leg with unspecified severity: Secondary | ICD-10-CM | POA: Insufficient documentation

## 2014-01-26 DIAGNOSIS — I872 Venous insufficiency (chronic) (peripheral): Secondary | ICD-10-CM | POA: Diagnosis not present

## 2014-01-27 DIAGNOSIS — J019 Acute sinusitis, unspecified: Secondary | ICD-10-CM | POA: Diagnosis not present

## 2014-01-27 DIAGNOSIS — J309 Allergic rhinitis, unspecified: Secondary | ICD-10-CM | POA: Diagnosis not present

## 2014-02-16 ENCOUNTER — Encounter (HOSPITAL_BASED_OUTPATIENT_CLINIC_OR_DEPARTMENT_OTHER): Payer: Medicare Other | Attending: Plastic Surgery

## 2014-02-16 DIAGNOSIS — L97809 Non-pressure chronic ulcer of other part of unspecified lower leg with unspecified severity: Secondary | ICD-10-CM | POA: Insufficient documentation

## 2014-02-16 DIAGNOSIS — I872 Venous insufficiency (chronic) (peripheral): Secondary | ICD-10-CM | POA: Insufficient documentation

## 2014-02-16 DIAGNOSIS — I890 Lymphedema, not elsewhere classified: Secondary | ICD-10-CM | POA: Diagnosis not present

## 2014-02-17 NOTE — Progress Notes (Signed)
[facility]  NAME:  JAYLEEN, AFONSO              ACCOUNT NO.:  [id number]  MEDICAL RECORD NO.:  [id number]      DATE OF BIRTH:  1955-11-30  PHYSICIAN:  Irene Limbo, MD    VISIT DATE:  02/16/2014                                  OFFICE VISIT   CHIEF COMPLAINT:  Left lower extremity ulcerations in the setting of venous stasis and lymphedema.  The patient is a 57 year old male who is here for followup of left lower extremity ulcerations.  At his last visit, he was switched to silver collagen.  In the interim, he states he ran out of the silver collagen and resumed the alginate dressings.  He continues in Juxta-Fit compression, he reports minimal drainage.  On examination, he has healed all of the medial and anterior wounds. There is one remaining posterior wound, measured at 0.8 x 0.6 x 0.1 cm. No debridement was performed.  We will plan to continue with silver collagen and Juxta-Fit compression, and he will follow up in 2 week's time.  He has had a significant contraction and has healed three out of his four wounds since his last visit.          ______________________________ Irene Limbo, MD     BT/MEDQ  D:  02/16/2014  T:  02/17/2014  Job:  [id number]

## 2014-03-02 DIAGNOSIS — I890 Lymphedema, not elsewhere classified: Secondary | ICD-10-CM | POA: Diagnosis not present

## 2014-03-02 DIAGNOSIS — I872 Venous insufficiency (chronic) (peripheral): Secondary | ICD-10-CM | POA: Diagnosis not present

## 2014-03-02 DIAGNOSIS — L97809 Non-pressure chronic ulcer of other part of unspecified lower leg with unspecified severity: Secondary | ICD-10-CM | POA: Diagnosis not present

## 2014-03-12 ENCOUNTER — Telehealth: Payer: Self-pay | Admitting: Internal Medicine

## 2014-03-12 NOTE — Telephone Encounter (Signed)
States he hasn't been set up for the CPAP machine yet.  [facility] hasn't gotten the required paperwork.  Huey Romans contacted [phone] - they have sent forms to Dr. Redmond Pulling and Dr. Quintin Alto who would not fill out the paperwork.  Asked that she send the form here and Dr. Debara Pickett will complete and send appropriate records to get this started. Sleep study, office note prior to sleep study and any visit post sleep study along with their form and fax to [phone].  Information given to Sheral Apley, RN.

## 2014-03-12 NOTE — Telephone Encounter (Signed)
Please call,pt said he have not been able to get his C Pap, They said they never received what they needed from this office.

## 2014-03-16 DIAGNOSIS — I890 Lymphedema, not elsewhere classified: Secondary | ICD-10-CM | POA: Diagnosis not present

## 2014-03-16 DIAGNOSIS — L97809 Non-pressure chronic ulcer of other part of unspecified lower leg with unspecified severity: Secondary | ICD-10-CM | POA: Diagnosis not present

## 2014-03-16 DIAGNOSIS — I872 Venous insufficiency (chronic) (peripheral): Secondary | ICD-10-CM | POA: Diagnosis not present

## 2014-03-18 ENCOUNTER — Telehealth: Payer: Self-pay | Admitting: *Deleted

## 2014-03-18 NOTE — Telephone Encounter (Signed)
Faxed signed orders for CPAP supplies to [facility] (along with sleep study report and last OV note)  Estimated length of need - 99 months CPAP - 8cm H2O Sleep Study Date - 10/15/13

## 2014-03-26 ENCOUNTER — Telehealth: Payer: Self-pay | Admitting: *Deleted

## 2014-03-26 NOTE — Telephone Encounter (Signed)
Faxed signed orders for CPAP supplies  99 months CPAP 8cm H2) Full mask (cushion/seal) Mask [employer]ge[employer] Tubing Disposable filters Non-disposable filters Water chamber for humidifier

## 2014-03-30 ENCOUNTER — Encounter (HOSPITAL_BASED_OUTPATIENT_CLINIC_OR_DEPARTMENT_OTHER): Payer: Medicare Other | Attending: Plastic Surgery

## 2014-03-30 DIAGNOSIS — L97809 Non-pressure chronic ulcer of other part of unspecified lower leg with unspecified severity: Secondary | ICD-10-CM | POA: Diagnosis not present

## 2014-03-31 NOTE — Progress Notes (Signed)
[facility]  NAME:  [name] [name], [name] [name]              ACCOUNT NO.:  [id number]  MEDICAL RECORD NO.:  [id number]      DATE OF BIRTH:  1956-01-11  PHYSICIAN:  Irene Limbo, MD    VISIT DATE:  03/30/2014                                  OFFICE VISIT   CHIEF COMPLAINT:  Follow up of left lower extremity venous ulceration.  HISTORY OF PRESENT ILLNESS:  The patient is a 58 year old male who presented with bilateral lower extremity ulcerations.  He has gone on to heal of right lower extremity and using a Juxta-Lite compression.  He presents today for followup of left lower extremity ulceration.  PHYSICAL EXAMINATION:  Blood pressure is 175/104, pulse is 56, temperature is 98.  Left lower extremity wound appears to be completely epithelialized at this time.  The left calf circumference is 49 cm, left ankle is 26.8 cm.  The patient will resume compression stockings to his left lower extremity.  The patient states he has several new pairs that he has available for use.  He was counseled to return at any time when he feels there has been a recurrence.          ______________________________ Irene Limbo, MD     BT/MEDQ  D:  03/30/2014  T:  03/31/2014  Job:  [id number]

## 2014-04-06 DIAGNOSIS — E119 Type 2 diabetes mellitus without complications: Secondary | ICD-10-CM | POA: Diagnosis not present

## 2014-04-06 DIAGNOSIS — I10 Essential (primary) hypertension: Secondary | ICD-10-CM | POA: Diagnosis not present

## 2014-04-06 DIAGNOSIS — E7800 Pure hypercholesterolemia, unspecified: Secondary | ICD-10-CM | POA: Diagnosis not present

## 2014-04-14 DIAGNOSIS — R609 Edema, unspecified: Secondary | ICD-10-CM | POA: Diagnosis not present

## 2014-04-14 DIAGNOSIS — R809 Proteinuria, unspecified: Secondary | ICD-10-CM | POA: Diagnosis not present

## 2014-04-14 DIAGNOSIS — E7800 Pure hypercholesterolemia, unspecified: Secondary | ICD-10-CM | POA: Diagnosis not present

## 2014-04-14 DIAGNOSIS — I6789 Other cerebrovascular disease: Secondary | ICD-10-CM | POA: Diagnosis not present

## 2014-04-14 DIAGNOSIS — E119 Type 2 diabetes mellitus without complications: Secondary | ICD-10-CM | POA: Diagnosis not present

## 2014-04-14 DIAGNOSIS — I10 Essential (primary) hypertension: Secondary | ICD-10-CM | POA: Diagnosis not present

## 2014-08-06 DIAGNOSIS — I10 Essential (primary) hypertension: Secondary | ICD-10-CM | POA: Diagnosis not present

## 2014-08-06 DIAGNOSIS — E119 Type 2 diabetes mellitus without complications: Secondary | ICD-10-CM | POA: Diagnosis not present

## 2014-08-06 DIAGNOSIS — E780 Pure hypercholesterolemia: Secondary | ICD-10-CM | POA: Diagnosis not present

## 2014-08-12 DIAGNOSIS — Z1389 Encounter for screening for other disorder: Secondary | ICD-10-CM | POA: Diagnosis not present

## 2014-08-12 DIAGNOSIS — E119 Type 2 diabetes mellitus without complications: Secondary | ICD-10-CM | POA: Diagnosis not present

## 2014-08-12 DIAGNOSIS — I10 Essential (primary) hypertension: Secondary | ICD-10-CM | POA: Diagnosis not present

## 2014-08-12 DIAGNOSIS — E780 Pure hypercholesterolemia: Secondary | ICD-10-CM | POA: Diagnosis not present

## 2014-08-17 ENCOUNTER — Encounter (HOSPITAL_BASED_OUTPATIENT_CLINIC_OR_DEPARTMENT_OTHER): Payer: Medicare Other | Attending: Plastic Surgery

## 2014-08-17 DIAGNOSIS — I872 Venous insufficiency (chronic) (peripheral): Secondary | ICD-10-CM | POA: Insufficient documentation

## 2014-08-17 DIAGNOSIS — L97919 Non-pressure chronic ulcer of unspecified part of right lower leg with unspecified severity: Secondary | ICD-10-CM | POA: Insufficient documentation

## 2014-08-18 NOTE — Consult Note (Signed)
NAMEMICKELL, BIRDWELL              ACCOUNT NO.:  [id number]  MEDICAL RECORD NO.:  [id number]  LOCATION:  FOOT                         FACILITY:  [place]  PHYSICIAN:  Irene Limbo, MD   DATE OF BIRTH:  [birth year]  DATE OF CONSULTATION:  08/17/2014 DATE OF DISCHARGE:                                CONSULTATION   CHIEF COMPLAINT:  Recurrent right lower extremity ulceration.  HISTORY OF PRESENT ILLNESS:  The patient is a 58 year old male with history of bilateral lower extremity venous stasis and obesity that presents with a recurrent ulceration over the last 1-2 weeks of the right lower extremity.  The patient was discharged from the wound center in July 2015 for a left lower extremity ulceration.  He has been compliant with his Juxta-Lite compression.  He notes that around this time when he was discharged from the wound clinic, he started Norvasc for control of his blood pressure. As a side effect of this medication, he developed increased lower extremity edema, and this medication was then discontinued.  He states a new medication has been recommended but he has not yet started.  He is on diuretics.  The patient reports left lower extremity ablation, however, over right lower extremity, he has not had any surgical intervention.  PAST MEDICAL HISTORY: 1. Hypertension. 2. Obesity. 3. Asbestosis. 4. History of stroke and TIA. 5. History of nephrolithiasis.  PAST SURGICAL HISTORY: 1. Left-sided venous surgery possible ablation in 2011. 2. He has also had interventions for kidney stones as well as a     vasectomy.  MEDICATIONS:  Aspirin, allopurinol, Lipitor, Plavix, metoprolol, and valsartan/hydrochlorothiazide daily.  No known drug allergies.  The patient is a nonsmoker.  Review of studies include vascular ultrasound dated on August 01, 2013.  This was noted to have venous insufficiency within both the right and left femoral veins, the right short saphenous as well as  right greater saphenous demonstrated valvular insufficiency.  The right greater saphenous vein appeared enlarged in size.  The left greater saphenous vein has a history of venous ablation.  I have no recent laboratory for review.  On examination, ABIs calculated as 1.0.  He has absent sensation over the right 1st metatarsal head and right heel.  All other tested points are present for sensation by Semmes-Weinstein testing.  Vitals, height is 5 feet 10 inches, weight is 355 pounds.  Blood pressure is 165/88, pulse is 57, temperature is 98.4.  Over the anterior right lower extremity, there is a superficial ulceration measured at 0.9 x 0.5 x 0.1 cm.  Right calf circumference is 55 cm, right ankle is 32.2 cm.  This has significantly increased since his prior visit here by over 10 cm at the level of the calf.  ASSESSMENT:  Recurrent right lower extremity ulceration in the setting of obesity and venous stasis as well as valvular insufficiency.  We will plan for re-referral to the vascular vein specialist for evaluation see whether the patient is a candidate for surgical intervention as he has recurrent ulcerations with insufficiency.  We will plan to institute collagen dressing changes as well as a Profore layered compression wrap as he has significant edema.  Counseled the patient to  start his new blood pressure medication.  He may need an increase in his diuretics. Presently, there is no cellulitis.  The patient, however, has had a history of multiple hospital admissions for IV antibiotics, for cellulitis of his lower extremities, and will need to watch for this. Plan for followup in 1 week's time.          ______________________________ Irene Limbo, MD MBA     BT/MEDQ  D:  08/17/2014  T:  08/18/2014  Job:  [id number]

## 2014-08-21 ENCOUNTER — Encounter (HOSPITAL_BASED_OUTPATIENT_CLINIC_OR_DEPARTMENT_OTHER): Payer: Medicare Other | Attending: Plastic Surgery

## 2014-08-21 DIAGNOSIS — I872 Venous insufficiency (chronic) (peripheral): Secondary | ICD-10-CM | POA: Insufficient documentation

## 2014-08-21 DIAGNOSIS — L97919 Non-pressure chronic ulcer of unspecified part of right lower leg with unspecified severity: Secondary | ICD-10-CM | POA: Diagnosis not present

## 2014-08-24 DIAGNOSIS — L97919 Non-pressure chronic ulcer of unspecified part of right lower leg with unspecified severity: Secondary | ICD-10-CM | POA: Diagnosis not present

## 2014-08-24 DIAGNOSIS — I872 Venous insufficiency (chronic) (peripheral): Secondary | ICD-10-CM | POA: Diagnosis not present

## 2014-08-25 ENCOUNTER — Other Ambulatory Visit: Payer: Self-pay | Admitting: *Deleted

## 2014-08-25 DIAGNOSIS — L97919 Non-pressure chronic ulcer of unspecified part of right lower leg with unspecified severity: Principal | ICD-10-CM

## 2014-08-25 DIAGNOSIS — I83019 Varicose veins of right lower extremity with ulcer of unspecified site: Secondary | ICD-10-CM

## 2014-08-26 ENCOUNTER — Encounter: Payer: Self-pay | Admitting: Vascular Surgery

## 2014-08-27 ENCOUNTER — Encounter: Payer: Self-pay | Admitting: Vascular Surgery

## 2014-08-27 ENCOUNTER — Ambulatory Visit (HOSPITAL_COMMUNITY)
Admission: RE | Admit: 2014-08-27 | Discharge: 2014-08-27 | Disposition: A | Discharge status: Home / Self Care | Payer: Medicare Other | Source: Ambulatory Visit | Attending: Vascular Surgery | Admitting: Vascular Surgery

## 2014-08-27 ENCOUNTER — Ambulatory Visit (INDEPENDENT_AMBULATORY_CARE_PROVIDER_SITE_OTHER): Payer: Medicare Other | Admitting: Vascular Surgery

## 2014-08-27 VITALS — BP 155/87 | HR 50 | Resp 18 | Ht 70.5 in | Wt 354.0 lb

## 2014-08-27 DIAGNOSIS — I83213 Varicose veins of right lower extremity with both ulcer of ankle and inflammation: Secondary | ICD-10-CM | POA: Diagnosis not present

## 2014-08-27 DIAGNOSIS — I83014 Varicose veins of right lower extremity with ulcer of heel and midfoot: Secondary | ICD-10-CM | POA: Diagnosis not present

## 2014-08-27 DIAGNOSIS — I83012 Varicose veins of right lower extremity with ulcer of calf: Secondary | ICD-10-CM | POA: Diagnosis not present

## 2014-08-27 DIAGNOSIS — L97219 Non-pressure chronic ulcer of right calf with unspecified severity: Principal | ICD-10-CM

## 2014-08-27 DIAGNOSIS — I83219 Varicose veins of right lower extremity with both ulcer of unspecified site and inflammation: Secondary | ICD-10-CM

## 2014-08-27 DIAGNOSIS — I83019 Varicose veins of right lower extremity with ulcer of unspecified site: Secondary | ICD-10-CM | POA: Diagnosis present

## 2014-08-27 DIAGNOSIS — I83212 Varicose veins of right lower extremity with both ulcer of calf and inflammation: Secondary | ICD-10-CM

## 2014-08-27 DIAGNOSIS — I83211 Varicose veins of right lower extremity with both ulcer of thigh and inflammation: Secondary | ICD-10-CM

## 2014-08-27 DIAGNOSIS — I83015 Varicose veins of right lower extremity with ulcer other part of foot: Secondary | ICD-10-CM

## 2014-08-27 DIAGNOSIS — I83011 Varicose veins of right lower extremity with ulcer of thigh: Secondary | ICD-10-CM

## 2014-08-27 DIAGNOSIS — I83218 Varicose veins of right lower extremity with both ulcer of other part of lower extremity and inflammation: Secondary | ICD-10-CM

## 2014-08-27 DIAGNOSIS — I83018 Varicose veins of right lower extremity with ulcer other part of lower leg: Secondary | ICD-10-CM | POA: Diagnosis not present

## 2014-08-27 DIAGNOSIS — I83013 Varicose veins of right lower extremity with ulcer of ankle: Secondary | ICD-10-CM

## 2014-08-27 DIAGNOSIS — I83215 Varicose veins of right lower extremity with both ulcer other part of foot and inflammation: Secondary | ICD-10-CM

## 2014-08-27 DIAGNOSIS — I83214 Varicose veins of right lower extremity with both ulcer of heel and midfoot and inflammation: Secondary | ICD-10-CM

## 2014-08-27 DIAGNOSIS — L97919 Non-pressure chronic ulcer of unspecified part of right lower leg with unspecified severity: Secondary | ICD-10-CM

## 2014-08-27 NOTE — Progress Notes (Signed)
Patient name: [name] [name] MRN: [id number] DOB: [birth year] Sex: male   Referred by: [place]  Reason for referral:  Chief Complaint  Patient presents with  . Varicose Veins    right leg wound    HISTORY OF PRESENT ILLNESS: Patient is known to me from a prior laser ablation of left great saphenous vein in 2009. He has a long history of severe bilateral venous hypertension related to deep venous reflux. In prior evaluations he has not had any evidence of correctable venous hypertension on the right. He is morbidly obese and has a difficult time with elevation. He has been relatively compliant with knee-high compression recurrence of ulcerations over the lateral aspect of his right calf and is seen today for further evaluation  Past Medical History  Diagnosis Date  . TIA (transient ischemic attack)   . Stroke   . Kidney stones   . Hypertension   . Asbestosis   . Varicose veins     Past Surgical History  Procedure Laterality Date  . Vein surgery Left 06/2010    laser?  . Kidney stone surgery    . Vasectomy  1984    History   Social History  . Marital Status: Married    Spouse Name: N/A    Number of Children: 4  . Years of Education: 14   Occupational History  . controll room operator  [employer]   Social History Main Topics  . Smoking status: Never Smoker   . Smokeless tobacco: Never Used  . Alcohol Use: No  . Drug Use: No  . Sexual Activity: Not on file   Other Topics Concern  . Not on file   Social History Narrative    Family History  Problem Relation Age of Onset  . Bone cancer Mother   . Hypertension Father   . Pancreatic cancer Maternal Grandmother   . Stroke Paternal Grandmother   . Diabetes Paternal Grandfather   . Heart attack Brother   . Hypertension Brother   . Heart attack Sister     Allergies as of 08/27/2014  . (No Known Allergies)    Current Outpatient Prescriptions on File Prior to Visit  Medication Sig Dispense Refill   . allopurinol (ZYLOPRIM) 300 MG tablet Take 300 mg by mouth daily.    Marland Kitchen aspirin 325 MG tablet Take 325 mg by mouth daily.    Marland Kitchen atorvastatin (LIPITOR) 10 MG tablet Take 10 mg by mouth daily.    . clopidogrel (PLAVIX) 75 MG tablet Take 75 mg by mouth daily.    . metoprolol succinate (TOPROL-XL) 100 MG 24 hr tablet Take 100 mg by mouth daily. Take with or immediately following a meal.    . valsartan-hydrochlorothiazide (DIOVAN-HCT) 320-25 MG per tablet Take 1 tablet by mouth daily.    Marland Kitchen ibuprofen (ADVIL,MOTRIN) 200 MG tablet Take 800 mg by mouth every 6 (six) hours as needed for pain.     No current facility-administered medications on file prior to visit.     REVIEW OF SYSTEMS:  Negative except as related to his past medical history   PHYSICAL EXAMINATION:  General: The patient is a well-nourished male, in no acute distress. Morbid obesity Vital signs are BP 155/87 mmHg  Pulse 50  Resp 18  Ht 5' 10.5" (1.791 m)  Wt 354 lb (160.573 kg)  BMI 50.06 kg/m2 Pulmonary: There is a good air exchange   Musculoskeletal: There are no major deformities.  Neurologic: No focal weakness or paresthesias  are detected, Skin: Marked changes of venous hypertension with hemosiderin deposit. He does have superficial excoriation over the lateral aspect of his right calf. Psychiatric: The patient has normal affect. Cardiovascular: There is a regular rate and rhythm without significant murmur appreciated. 2+ dorsalis pedis pulse on the right.  [facility] Vascular Lab Studies:  Ordered and Independently Reviewed this revealed no evidence of DVT. Does have significant reflux in his right common femoral vein. No reflux in his superficial veins.    Impression and Plan:  Again discussed this at length with patient. Explained the importance of elevation compression. He is being treated at the wound center appropriately with compression. Will continue follow-up with them. Explained that he does have normal  arterial flow and therefore this should not be limb threatening. He will continue with attempts at weight loss, elevation and compression and see [place] on an as-needed basis    [facility] Office: [phone]

## 2014-08-31 DIAGNOSIS — I872 Venous insufficiency (chronic) (peripheral): Secondary | ICD-10-CM | POA: Diagnosis not present

## 2014-08-31 DIAGNOSIS — L97919 Non-pressure chronic ulcer of unspecified part of right lower leg with unspecified severity: Secondary | ICD-10-CM | POA: Diagnosis not present

## 2014-09-01 NOTE — Progress Notes (Signed)
[facility]  NAME:  [name] [name], [name] [name]                   ACCOUNT NO.:  MEDICAL RECORD NO.:  [id number]      DATE OF BIRTH:  02-15-1956  PHYSICIAN:  Theodoro Kos, DO       VISIT DATE:  08/31/2014                                  OFFICE VISIT   The patient is a 58 year old male who is here for followup on his left leg ulcer, chronic venous insufficiency.  There has been no change in his medications or social history.  He has been using the VAC.  Review of systems is otherwise negative.  Socially, he has applied for disability and is waiting to hear.  On exam, he is alert, oriented, cooperative, not in any acute distress. He is pleasant.  His breathing is unlabored.  His heart rate is regular. The wound is very clean.  There is no sign of infection.  It is granulating in very nicely.  Recommendation is to continue with the [facility] and follow up in 1 week.     Theodoro Kos, DO     CS/MEDQ  D:  08/31/2014  T:  09/01/2014  Job:  [id number]

## 2014-09-01 NOTE — Progress Notes (Signed)
[facility]  NAME:  [name] [name], [name] [name]              ACCOUNT NO.:  [id number]  MEDICAL RECORD NO.:  [id number]      DATE OF BIRTH:  09-Aug-1956  PHYSICIAN:  Theodoro Kos, DO       VISIT DATE:  08/31/2014                                  OFFICE VISIT   The patient is a 58 year old male who is here for followup on his right lower extremity chronic venous insufficiency ulcers.  He has been using Profore on this with some improvement.  There has been no change in his medications or social history.  Review of systems is otherwise negative.  He does have a Juxta Lite on his left leg.  He has got some redness and swelling.  On exam, he is alert, oriented, cooperative, not in any acute distress. He is pretty quiet.  The ulcers are flushed with the skin, but seemed to be improving.  Pulse is present, but weak.  Recommendation is to continue with collagen and Profore and elevation. We will check a prealbumin and we will see him back in a week.     Theodoro Kos, DO     CS/MEDQ  D:  08/31/2014  T:  09/01/2014  Job:  [id number]

## 2014-09-03 DIAGNOSIS — I83215 Varicose veins of right lower extremity with both ulcer other part of foot and inflammation: Secondary | ICD-10-CM | POA: Diagnosis not present

## 2014-09-07 DIAGNOSIS — L97919 Non-pressure chronic ulcer of unspecified part of right lower leg with unspecified severity: Secondary | ICD-10-CM | POA: Diagnosis not present

## 2014-09-07 DIAGNOSIS — I872 Venous insufficiency (chronic) (peripheral): Secondary | ICD-10-CM | POA: Diagnosis not present

## 2014-09-14 DIAGNOSIS — L97919 Non-pressure chronic ulcer of unspecified part of right lower leg with unspecified severity: Secondary | ICD-10-CM | POA: Diagnosis not present

## 2014-09-14 DIAGNOSIS — I872 Venous insufficiency (chronic) (peripheral): Secondary | ICD-10-CM | POA: Diagnosis not present

## 2014-09-29 ENCOUNTER — Encounter (HOSPITAL_COMMUNITY): Payer: Medicare Other

## 2014-09-29 ENCOUNTER — Encounter: Payer: Medicare Other | Admitting: Vascular Surgery

## 2014-10-03 ENCOUNTER — Encounter: Payer: Self-pay | Admitting: Cardiology

## 2014-10-03 DIAGNOSIS — R0600 Dyspnea, unspecified: Secondary | ICD-10-CM | POA: Diagnosis not present

## 2014-10-03 DIAGNOSIS — N390 Urinary tract infection, site not specified: Secondary | ICD-10-CM | POA: Diagnosis present

## 2014-10-03 DIAGNOSIS — M7989 Other specified soft tissue disorders: Secondary | ICD-10-CM | POA: Diagnosis not present

## 2014-10-03 DIAGNOSIS — Z800 Family history of malignant neoplasm of digestive organs: Secondary | ICD-10-CM | POA: Diagnosis not present

## 2014-10-03 DIAGNOSIS — I509 Heart failure, unspecified: Secondary | ICD-10-CM | POA: Diagnosis not present

## 2014-10-03 DIAGNOSIS — I517 Cardiomegaly: Secondary | ICD-10-CM | POA: Diagnosis not present

## 2014-10-03 DIAGNOSIS — Z79899 Other long term (current) drug therapy: Secondary | ICD-10-CM | POA: Diagnosis not present

## 2014-10-03 DIAGNOSIS — Z8249 Family history of ischemic heart disease and other diseases of the circulatory system: Secondary | ICD-10-CM | POA: Diagnosis not present

## 2014-10-03 DIAGNOSIS — Z23 Encounter for immunization: Secondary | ICD-10-CM | POA: Diagnosis not present

## 2014-10-03 DIAGNOSIS — Z808 Family history of malignant neoplasm of other organs or systems: Secondary | ICD-10-CM | POA: Diagnosis not present

## 2014-10-03 DIAGNOSIS — A419 Sepsis, unspecified organism: Secondary | ICD-10-CM | POA: Diagnosis present

## 2014-10-03 DIAGNOSIS — Z77090 Contact with and (suspected) exposure to asbestos: Secondary | ICD-10-CM | POA: Diagnosis present

## 2014-10-03 DIAGNOSIS — J9690 Respiratory failure, unspecified, unspecified whether with hypoxia or hypercapnia: Secondary | ICD-10-CM | POA: Diagnosis not present

## 2014-10-03 DIAGNOSIS — Z6841 Body Mass Index (BMI) 40.0 and over, adult: Secondary | ICD-10-CM | POA: Diagnosis not present

## 2014-10-03 DIAGNOSIS — I5031 Acute diastolic (congestive) heart failure: Secondary | ICD-10-CM | POA: Diagnosis present

## 2014-10-03 DIAGNOSIS — Z7902 Long term (current) use of antithrombotics/antiplatelets: Secondary | ICD-10-CM | POA: Diagnosis not present

## 2014-10-03 DIAGNOSIS — J189 Pneumonia, unspecified organism: Secondary | ICD-10-CM | POA: Diagnosis not present

## 2014-10-03 DIAGNOSIS — J9602 Acute respiratory failure with hypercapnia: Secondary | ICD-10-CM | POA: Diagnosis present

## 2014-10-03 DIAGNOSIS — R918 Other nonspecific abnormal finding of lung field: Secondary | ICD-10-CM | POA: Diagnosis not present

## 2014-10-03 DIAGNOSIS — J9811 Atelectasis: Secondary | ICD-10-CM | POA: Diagnosis not present

## 2014-10-03 DIAGNOSIS — R4182 Altered mental status, unspecified: Secondary | ICD-10-CM | POA: Diagnosis present

## 2014-10-03 DIAGNOSIS — Z803 Family history of malignant neoplasm of breast: Secondary | ICD-10-CM | POA: Diagnosis not present

## 2014-10-03 DIAGNOSIS — R6520 Severe sepsis without septic shock: Secondary | ICD-10-CM | POA: Diagnosis present

## 2014-10-03 DIAGNOSIS — M109 Gout, unspecified: Secondary | ICD-10-CM | POA: Diagnosis present

## 2014-10-03 DIAGNOSIS — I69398 Other sequelae of cerebral infarction: Secondary | ICD-10-CM | POA: Diagnosis not present

## 2014-10-03 DIAGNOSIS — E785 Hyperlipidemia, unspecified: Secondary | ICD-10-CM | POA: Diagnosis present

## 2014-10-03 DIAGNOSIS — Z7982 Long term (current) use of aspirin: Secondary | ICD-10-CM | POA: Diagnosis not present

## 2014-10-05 ENCOUNTER — Encounter: Payer: Self-pay | Admitting: Cardiology

## 2014-10-05 ENCOUNTER — Encounter: Payer: Self-pay | Admitting: Cardiovascular Disease

## 2014-10-14 ENCOUNTER — Encounter: Payer: Self-pay | Admitting: Cardiology

## 2014-10-14 ENCOUNTER — Ambulatory Visit (INDEPENDENT_AMBULATORY_CARE_PROVIDER_SITE_OTHER): Payer: Medicare Other | Admitting: Cardiology

## 2014-10-14 VITALS — BP 142/78 | HR 57 | Ht 70.0 in | Wt 368.0 lb

## 2014-10-14 DIAGNOSIS — I10 Essential (primary) hypertension: Secondary | ICD-10-CM | POA: Diagnosis not present

## 2014-10-14 DIAGNOSIS — I5032 Chronic diastolic (congestive) heart failure: Secondary | ICD-10-CM | POA: Diagnosis not present

## 2014-10-14 DIAGNOSIS — G4733 Obstructive sleep apnea (adult) (pediatric): Secondary | ICD-10-CM | POA: Diagnosis not present

## 2014-10-14 NOTE — Assessment & Plan Note (Signed)
Blood pressure mildly elevated. Hydralazine was the most recent addition to his regimen. This can be further up titrated if needed.

## 2014-10-14 NOTE — Patient Instructions (Addendum)
Your physician recommends that you schedule a follow-up appointment in: 6 months. You will receive a reminder letter in the mail in about 4 months reminding you to call and schedule your appointment. If you don't receive this letter, please contact our office. Your physician recommends that you continue on your current medications as directed. Please refer to the Current Medication list given to you today. Your physician recommends that you have lab work in 6 months just before your next visit to check your BMET.

## 2014-10-14 NOTE — Progress Notes (Addendum)
Reason for visit: History of diastolic dysfunction  Clinical Summary Mr. Brechtel is a 59 y.o.male referred to the office by Dr. Quintin Alto, a former patient of Dr. Debara Pickett last seen in December 2014. His wife Hassan Rowan is a patient of mine. Records indicate history of normal systolic function by echocardiogram with only mild diastolic dysfunction, morbid obesity with OSA requiring CPAP, venous reflux with varicose veins status post prior laser ablation as detailed below as well as chronic leg wounds that have been slow to heal.   He was just recently admitted to [facility] with reported left upper lobe pneumonia and also potential component of diastolic heart failure. He was treated with antibiotics, also diuresed. Lower extremity venous Dopplers done at [facility] showed no evidence of DVT. Follow-up echocardiogram interpreted by Dr. Hamilton Capri with the [facility] cardiology practice revealed moderate LVH with LVEF 18-56%, diastolic dysfunction (not graded), no significant valvular abnormalities, unable to assess PASP. He was discharged on Lasix 40 mg daily. He tells me that he only uses the Lasix as needed.  Lab work from January 19 showed BUN 29, creatinine 0.8, potassium 4.0.  He wraps both of his lower legs he should day to try to prevent worsening venous reflux and edema. He states that the right leg wound has healed up and he is no longer going to the wound clinic. He does not endorse any chest pain or palpitations. We reviewed his medications which look reasonable, including the addition of hydralazine.  Of note, when he was walking into the office through the parking lot today, he tripped on a elevated portion of the sidewalk and sustained an abrasion on his left forehead above the eye, also on his knees. He was assisted by our staff and brought in, wounds were cleaned and dressed. Otherwise he reported no significant pain or difficulty walking, no dizziness or loss of consciousness, stated that he felt fine  to walk out with no other assistance.  No Known Allergies  Current Outpatient Prescriptions  Medication Sig Dispense Refill  . allopurinol (ZYLOPRIM) 300 MG tablet Take 300 mg by mouth daily.    Marland Kitchen aspirin 325 MG tablet Take 325 mg by mouth daily.    Marland Kitchen atorvastatin (LIPITOR) 10 MG tablet Take 10 mg by mouth daily.    . clindamycin (CLEOCIN) 300 MG capsule Take 300 mg by mouth 4 (four) times daily.    . cloNIDine (CATAPRES) 0.1 MG tablet Take 0.1 mg by mouth 2 (two) times daily.    . clopidogrel (PLAVIX) 75 MG tablet Take 75 mg by mouth daily.    . furosemide (LASIX) 40 MG tablet Take 40 mg by mouth as needed for fluid or edema (weight gain).    . hydrALAZINE (APRESOLINE) 25 MG tablet Take 25 mg by mouth 3 (three) times daily.    Marland Kitchen ibuprofen (ADVIL,MOTRIN) 200 MG tablet Take 800 mg by mouth every 6 (six) hours as needed for pain.    . metoprolol succinate (TOPROL-XL) 100 MG 24 hr tablet Take 100 mg by mouth daily. Take with or immediately following a meal.    . valsartan-hydrochlorothiazide (DIOVAN-HCT) 320-25 MG per tablet Take 1 tablet by mouth daily.     No current facility-administered medications for this visit.    Past Medical History  Diagnosis Date  . TIA (transient ischemic attack)   . History of stroke   . History of nephrolithiasis   . Essential hypertension   . Asbestosis   . Varicose veins     Laser ablation  of left great saphenous vein 2009  . History of open leg wound     Wound clinic   . Obstructive sleep apnea     On CPAP     Past Surgical History  Procedure Laterality Date  . Vein surgery Left 06/2010    Laser ablation  . Kidney stone surgery    . Vasectomy  1984    Family History  Problem Relation Age of Onset  . Bone cancer Mother   . Hypertension Father   . Pancreatic cancer Maternal Grandmother   . Stroke Paternal Grandmother   . Diabetes Paternal Grandfather   . Heart attack Brother   . Hypertension Brother   . Heart attack Sister      Social History Mr. Bezek reports that he has never smoked. He has never used smokeless tobacco. Mr. Taussig reports that he does not drink alcohol.  Review of Systems Complete review of systems negative except as otherwise outlined in the clinical summary and also the following. States he drinks 4.Dr. Samson Frederic a day, not a lot of other fluid intake. We discussed sodium restriction. He does not get regular exercise.  Physical Examination Filed Vitals:   10/14/14 1342  BP: 142/78  Pulse: 57    Wt Readings from Last 3 Encounters:  10/14/14 368 lb (166.924 kg)  08/27/14 354 lb (160.573 kg)  08/18/13 353 lb 14.4 oz (160.528 kg)   Morbidly obese male, no distress. HEENT: Conjunctiva and lids normal, abrasion over her left eye, oropharynx clear. Neck: Supple, increased girth without obvious elevated JVP or carotid bruits, no thyromegaly. Lungs: Diminished breath sounds, nonlabored breathing at rest. Cardiac: Regular rate and rhythm, no S3, soft systolic murmur, no pericardial rub. Abdomen: Soft, obese and protuberant, bowel sounds present, no guarding or rebound. Extremities: Small abrasions on knees. Bilateral, chronic appearing stasis and edema of the legs with wraps in place, distal pulses 1-2+. Skin: Warm and dry. Musculoskeletal: No kyphosis. Neuropsychiatric: Alert and oriented x3, affect grossly appropriate.   Problem List and Plan   Chronic diastolic heart failure Recent evaluation noted. He has normal LV systolic function, increased volume status with leg edema is likely multifactorial in the setting of OSA and also venous reflux and stasis. He uses compression wraps on his legs, medications include Diovan HCT, Toprol-XL, hydralazine, and clonidine. Lasix is also used as needed. For now no changes made in regimen. We will see him back in 6 months.   Essential hypertension Blood pressure mildly elevated. Hydralazine was the most recent addition to his regimen. This can  be further up titrated if needed.   OSA (obstructive sleep apnea) On CPAP.   Morbid obesity Weight loss discussed.     Satira Sark, M.D., F.A.C.C.

## 2014-10-14 NOTE — Assessment & Plan Note (Signed)
Weight loss discussed  

## 2014-10-14 NOTE — Assessment & Plan Note (Signed)
Recent evaluation noted. He has normal LV systolic function, increased volume status with leg edema is likely multifactorial in the setting of OSA and also venous reflux and stasis. He uses compression wraps on his legs, medications include Diovan HCT, Toprol-XL, hydralazine, and clonidine. Lasix is also used as needed. For now no changes made in regimen. We will see him back in 6 months.

## 2014-10-14 NOTE — Assessment & Plan Note (Signed)
On CPAP. ?

## 2014-11-05 DIAGNOSIS — J189 Pneumonia, unspecified organism: Secondary | ICD-10-CM | POA: Diagnosis not present

## 2014-11-05 DIAGNOSIS — A419 Sepsis, unspecified organism: Secondary | ICD-10-CM | POA: Diagnosis not present

## 2014-11-05 DIAGNOSIS — I5033 Acute on chronic diastolic (congestive) heart failure: Secondary | ICD-10-CM | POA: Diagnosis not present

## 2014-11-10 DIAGNOSIS — J189 Pneumonia, unspecified organism: Secondary | ICD-10-CM | POA: Diagnosis not present

## 2014-11-10 DIAGNOSIS — I10 Essential (primary) hypertension: Secondary | ICD-10-CM | POA: Diagnosis not present

## 2014-11-26 DIAGNOSIS — E780 Pure hypercholesterolemia: Secondary | ICD-10-CM | POA: Diagnosis not present

## 2014-11-26 DIAGNOSIS — I5033 Acute on chronic diastolic (congestive) heart failure: Secondary | ICD-10-CM | POA: Diagnosis not present

## 2014-11-26 DIAGNOSIS — E119 Type 2 diabetes mellitus without complications: Secondary | ICD-10-CM | POA: Diagnosis not present

## 2014-11-26 DIAGNOSIS — I10 Essential (primary) hypertension: Secondary | ICD-10-CM | POA: Diagnosis not present

## 2014-12-03 DIAGNOSIS — E780 Pure hypercholesterolemia: Secondary | ICD-10-CM | POA: Diagnosis not present

## 2014-12-03 DIAGNOSIS — E1129 Type 2 diabetes mellitus with other diabetic kidney complication: Secondary | ICD-10-CM | POA: Diagnosis not present

## 2014-12-03 DIAGNOSIS — I10 Essential (primary) hypertension: Secondary | ICD-10-CM | POA: Diagnosis not present

## 2014-12-03 DIAGNOSIS — M109 Gout, unspecified: Secondary | ICD-10-CM | POA: Diagnosis not present

## 2014-12-03 DIAGNOSIS — E1121 Type 2 diabetes mellitus with diabetic nephropathy: Secondary | ICD-10-CM | POA: Diagnosis not present

## 2014-12-16 DIAGNOSIS — R1110 Vomiting, unspecified: Secondary | ICD-10-CM | POA: Diagnosis not present

## 2015-01-22 DIAGNOSIS — R0600 Dyspnea, unspecified: Secondary | ICD-10-CM | POA: Diagnosis not present

## 2015-01-22 DIAGNOSIS — Z87898 Personal history of other specified conditions: Secondary | ICD-10-CM | POA: Diagnosis not present

## 2015-01-22 DIAGNOSIS — R0602 Shortness of breath: Secondary | ICD-10-CM | POA: Diagnosis not present

## 2015-01-22 DIAGNOSIS — I517 Cardiomegaly: Secondary | ICD-10-CM | POA: Diagnosis not present

## 2015-01-22 DIAGNOSIS — R05 Cough: Secondary | ICD-10-CM | POA: Diagnosis not present

## 2015-03-25 DIAGNOSIS — E780 Pure hypercholesterolemia: Secondary | ICD-10-CM | POA: Diagnosis not present

## 2015-03-25 DIAGNOSIS — E1121 Type 2 diabetes mellitus with diabetic nephropathy: Secondary | ICD-10-CM | POA: Diagnosis not present

## 2015-03-25 DIAGNOSIS — I10 Essential (primary) hypertension: Secondary | ICD-10-CM | POA: Diagnosis not present

## 2015-04-01 DIAGNOSIS — E1129 Type 2 diabetes mellitus with other diabetic kidney complication: Secondary | ICD-10-CM | POA: Diagnosis not present

## 2015-04-01 DIAGNOSIS — E1121 Type 2 diabetes mellitus with diabetic nephropathy: Secondary | ICD-10-CM | POA: Diagnosis not present

## 2015-04-01 DIAGNOSIS — E780 Pure hypercholesterolemia: Secondary | ICD-10-CM | POA: Diagnosis not present

## 2015-04-01 DIAGNOSIS — I10 Essential (primary) hypertension: Secondary | ICD-10-CM | POA: Diagnosis not present

## 2015-04-01 DIAGNOSIS — M109 Gout, unspecified: Secondary | ICD-10-CM | POA: Diagnosis not present

## 2015-04-02 DIAGNOSIS — I10 Essential (primary) hypertension: Secondary | ICD-10-CM | POA: Diagnosis not present

## 2015-04-07 ENCOUNTER — Encounter: Payer: Self-pay | Admitting: *Deleted

## 2015-04-08 ENCOUNTER — Encounter: Payer: Self-pay | Admitting: Cardiology

## 2015-04-08 ENCOUNTER — Encounter: Payer: Self-pay | Admitting: *Deleted

## 2015-04-08 ENCOUNTER — Ambulatory Visit (INDEPENDENT_AMBULATORY_CARE_PROVIDER_SITE_OTHER): Payer: Medicare Other | Admitting: Cardiology

## 2015-04-08 VITALS — BP 178/90 | HR 55 | Ht 70.0 in | Wt 366.0 lb

## 2015-04-08 DIAGNOSIS — I10 Essential (primary) hypertension: Secondary | ICD-10-CM | POA: Diagnosis not present

## 2015-04-08 DIAGNOSIS — G4733 Obstructive sleep apnea (adult) (pediatric): Secondary | ICD-10-CM | POA: Diagnosis not present

## 2015-04-08 DIAGNOSIS — I5032 Chronic diastolic (congestive) heart failure: Secondary | ICD-10-CM | POA: Diagnosis not present

## 2015-04-08 NOTE — Progress Notes (Signed)
Cardiology Office Note  Date: 04/08/2015   ID: [name], [name] 27-Feb-1956, MRN [id number]  PCP: Manon Hilding, MD  Primary Cardiologist: Rozann Lesches, MD   Chief Complaint  Patient presents with  . Diastolic heart failure  . Hypertension    History of Present Illness: [name] [name] is a 59 y.o. male last seen in January. He presents for a routine follow-up today. Weight is stable compared to January, he remains morbidly obese. He has not been able to lose significant weight over time. We reviewed his medications, he reports compliance. Uses Lasix only as needed.  He continues to follow with Dr. Quintin Alto. Lab work is outlined below.  He has not been rehospitalized, goes to the wound clinic perhaps every 4-6 months.  He does not exercise. He is interested in genealogy, has done a lot of research on his family going back to the 17th century in [place].   Past Medical History  Diagnosis Date  . TIA (transient ischemic attack)   . History of stroke   . History of nephrolithiasis   . Essential hypertension   . Asbestosis   . Varicose veins     Laser ablation of left great saphenous vein 2009  . History of open leg wound     Wound clinic   . Obstructive sleep apnea     On CPAP     Current Outpatient Prescriptions  Medication Sig Dispense Refill  . allopurinol (ZYLOPRIM) 300 MG tablet Take 300 mg by mouth daily.    Marland Kitchen aspirin 325 MG tablet Take 325 mg by mouth daily.    Marland Kitchen atorvastatin (LIPITOR) 10 MG tablet Take 10 mg by mouth daily.    . clopidogrel (PLAVIX) 75 MG tablet Take 75 mg by mouth daily.    . furosemide (LASIX) 40 MG tablet Take 40 mg by mouth as needed for fluid or edema (weight gain).    . hydrALAZINE (APRESOLINE) 25 MG tablet Take 25 mg by mouth 3 (three) times daily.    Marland Kitchen ibuprofen (ADVIL,MOTRIN) 200 MG tablet Take 800 mg by mouth every 6 (six) hours as needed for pain.    . metoprolol succinate (TOPROL-XL) 100 MG 24 hr tablet Take 100 mg by  mouth daily. Take with or immediately following a meal.    . valsartan-hydrochlorothiazide (DIOVAN-HCT) 320-25 MG per tablet Take 1 tablet by mouth daily.     No current facility-administered medications for this visit.    Allergies:  Review of patient's allergies indicates no known allergies.   Social History: The patient  reports that he has never smoked. He has never used smokeless tobacco. He reports that he does not drink alcohol or use illicit drugs.   ROS:  Please see the history of present illness. Otherwise, complete review of systems is positive for chronic leg edema, he uses wraps.  All other systems are reviewed and negative.   Physical Exam: VS:  BP 178/90 mmHg  Pulse 55  Ht 5\' 10"  (1.778 m)  Wt 366 lb (166.017 kg)  BMI 52.52 kg/m2  SpO2 93%, BMI Body mass index is 52.52 kg/(m^2).  Wt Readings from Last 3 Encounters:  04/08/15 366 lb (166.017 kg)  10/14/14 368 lb (166.924 kg)  08/27/14 354 lb (160.573 kg)     Morbidly obese male, no distress. HEENT: Conjunctiva and lids normal, oropharynx clear. Neck: Supple, increased girth without obvious elevated JVP or carotid bruits, no thyromegaly. Lungs: Diminished breath sounds, nonlabored breathing at rest. Cardiac: Regular rate  and rhythm, no S3, soft systolic murmur, no pericardial rub. Abdomen: Soft, obese and protuberant, bowel sounds present, no guarding or rebound. Extremities: Bilateral, chronic appearing stasis and edema of the legs with wraps in place, distal pulses 1-2+. Skin: Warm and dry. Musculoskeletal: No kyphosis. Neuropsychiatric: Alert and oriented x3, affect grossly appropriate.   ECG: ECG is not ordered today.   Recent Labwork:  January 2016: BUN 29, creatinine 0.8, potassium 4.0, AST 16, ALT 18, troponin I negative 3, hemoglobin 13.7, platelets 164.  July 2016: Cholesterol 119, triglycerides 1:30, HDL 35, LDL 58, BUN 20, creatinine 0.9, potassium 4.3, AST 19, ALT 19, hemoglobin A1c  6.3.  Assessment and Plan:  1. Chronic diastolic heart failure, stable weight with no progressive edema although difficult to assess due to body habitus. He has chronic venous stasis and continues to use Lasix as needed.  2. Hypertension, blood pressure elevated today. He reports compliance with his medications. Weight loss would be beneficial. Keep follow-up with Dr. Quintin Alto. Hydralazine could be increased further if needed.  3. Obstructive sleep apnea, on CPAP.  Current medicines were reviewed with the patient today.  Disposition: FU with me in 6 months.   Signed, Satira Sark, MD, [facility] 04/08/2015 3:37 PM    [facility], [place], [place] [address] Phone: [phone]; Fax: [phone]

## 2015-04-08 NOTE — Patient Instructions (Signed)
Continue all current medications. Your physician wants you to follow up in: 6 months.  You will receive a reminder letter in the mail one-two months in advance.  If you don't receive a letter, please call our office to schedule the follow up appointment   

## 2015-04-09 ENCOUNTER — Ambulatory Visit: Payer: Medicare Other | Admitting: Cardiology

## 2015-04-12 NOTE — Patient Outreach (Signed)
[place] [facility]) Care Management  04/12/2015  [name] [name] 05/31/1956 [id number]   Referral from [place] List, assigned Mariann Laster, RN to outreach.  Ronnell Freshwater. [place], [place] Management [place] Assistant Phone: [phone] Fax: [phone]

## 2015-04-21 DIAGNOSIS — A419 Sepsis, unspecified organism: Secondary | ICD-10-CM | POA: Diagnosis not present

## 2015-04-21 DIAGNOSIS — J189 Pneumonia, unspecified organism: Secondary | ICD-10-CM | POA: Diagnosis not present

## 2015-04-21 DIAGNOSIS — A084 Viral intestinal infection, unspecified: Secondary | ICD-10-CM | POA: Diagnosis not present

## 2015-04-21 DIAGNOSIS — I517 Cardiomegaly: Secondary | ICD-10-CM | POA: Diagnosis not present

## 2015-04-21 DIAGNOSIS — R40243 Glasgow coma scale score 3-8: Secondary | ICD-10-CM | POA: Diagnosis not present

## 2015-04-21 DIAGNOSIS — I509 Heart failure, unspecified: Secondary | ICD-10-CM | POA: Diagnosis not present

## 2015-04-21 DIAGNOSIS — R0600 Dyspnea, unspecified: Secondary | ICD-10-CM | POA: Diagnosis not present

## 2015-04-22 DIAGNOSIS — Z7902 Long term (current) use of antithrombotics/antiplatelets: Secondary | ICD-10-CM | POA: Diagnosis not present

## 2015-04-22 DIAGNOSIS — Z79899 Other long term (current) drug therapy: Secondary | ICD-10-CM | POA: Diagnosis not present

## 2015-04-22 DIAGNOSIS — J61 Pneumoconiosis due to asbestos and other mineral fibers: Secondary | ICD-10-CM | POA: Diagnosis present

## 2015-04-22 DIAGNOSIS — R509 Fever, unspecified: Secondary | ICD-10-CM | POA: Diagnosis not present

## 2015-04-22 DIAGNOSIS — Z7982 Long term (current) use of aspirin: Secondary | ICD-10-CM | POA: Diagnosis not present

## 2015-04-22 DIAGNOSIS — I517 Cardiomegaly: Secondary | ICD-10-CM | POA: Diagnosis not present

## 2015-04-22 DIAGNOSIS — I872 Venous insufficiency (chronic) (peripheral): Secondary | ICD-10-CM | POA: Diagnosis present

## 2015-04-22 DIAGNOSIS — A419 Sepsis, unspecified organism: Secondary | ICD-10-CM | POA: Diagnosis not present

## 2015-04-22 DIAGNOSIS — I5033 Acute on chronic diastolic (congestive) heart failure: Secondary | ICD-10-CM | POA: Diagnosis present

## 2015-04-22 DIAGNOSIS — I10 Essential (primary) hypertension: Secondary | ICD-10-CM | POA: Diagnosis present

## 2015-04-22 DIAGNOSIS — R0600 Dyspnea, unspecified: Secondary | ICD-10-CM | POA: Diagnosis not present

## 2015-04-22 DIAGNOSIS — J189 Pneumonia, unspecified organism: Secondary | ICD-10-CM | POA: Diagnosis not present

## 2015-04-22 DIAGNOSIS — I509 Heart failure, unspecified: Secondary | ICD-10-CM | POA: Diagnosis not present

## 2015-04-22 DIAGNOSIS — M109 Gout, unspecified: Secondary | ICD-10-CM | POA: Diagnosis present

## 2015-04-22 DIAGNOSIS — L97919 Non-pressure chronic ulcer of unspecified part of right lower leg with unspecified severity: Secondary | ICD-10-CM | POA: Diagnosis present

## 2015-04-22 DIAGNOSIS — J9601 Acute respiratory failure with hypoxia: Secondary | ICD-10-CM | POA: Diagnosis present

## 2015-04-22 DIAGNOSIS — Z8673 Personal history of transient ischemic attack (TIA), and cerebral infarction without residual deficits: Secondary | ICD-10-CM | POA: Diagnosis not present

## 2015-04-26 ENCOUNTER — Other Ambulatory Visit: Payer: Self-pay

## 2015-04-26 NOTE — Patient Outreach (Signed)
[facility] [facility]) Care Management  04/26/2015  [name] [name] 15-Mar-1956 [id number]  Telephonic Care Management (Triage Screening) Note  Referral Date:  04/12/15 Referral Source:  MD Referral Issue:  CHF, 2 admissions and 1 ED visit in the past 12 months.   Outreach call #1.  Contact reached and states patient is outside right now and request a call back at another time.  RN CM scheduled for next outreach call.   Mariann Laster, RN, BSN, [facility], CCM  [employer] Management Coordinator [phone] Direct [phone] Cell [phone] Office [phone] Fax

## 2015-04-28 ENCOUNTER — Other Ambulatory Visit: Payer: Medicare Other

## 2015-04-28 NOTE — Patient Outreach (Signed)
[facility] [facility]) Care Management  04/28/2015  [name] [name] 1955/11/16 [id number]  Telephonic Care Management (Triage Screening) Note  Referral Date: 04/12/15 Referral Source: MD Referral Issue: CHF, 2 admissions and 1 ED visit in the past 12 months.   Outreach call #2. Patient not reached. RN CM left HIPAA compliant voice message with name and contact #.  RN CM rescheduled for next outreach call within 1 week.   Mariann Laster, RN, BSN, [facility], CCM  [employer] Management Coordinator [phone] Direct [phone] Cell [phone] Office [phone] Fax

## 2015-04-30 ENCOUNTER — Ambulatory Visit: Payer: Medicare Other

## 2015-05-03 ENCOUNTER — Ambulatory Visit: Payer: Self-pay

## 2015-05-03 DIAGNOSIS — I10 Essential (primary) hypertension: Secondary | ICD-10-CM | POA: Diagnosis not present

## 2015-05-03 DIAGNOSIS — I5032 Chronic diastolic (congestive) heart failure: Secondary | ICD-10-CM | POA: Diagnosis not present

## 2015-05-04 ENCOUNTER — Other Ambulatory Visit: Payer: Self-pay

## 2015-05-04 VITALS — Ht 70.0 in | Wt 359.0 lb

## 2015-05-04 DIAGNOSIS — I5032 Chronic diastolic (congestive) heart failure: Secondary | ICD-10-CM

## 2015-05-04 NOTE — Patient Outreach (Signed)
[place] [facility]) Care Management  05/04/2015  Paulmichael Schreck [birth year] [id number]   Notification from Mariann Laster, RN to assign Community RN, assigned Jacqlyn Larsen, RN.  Thanks, Ronnell Freshwater. [place], [place] Assistant Phone: [phone] Fax: [phone]

## 2015-05-04 NOTE — Patient Outreach (Addendum)
[place] [facility]) Care Management  05/04/2015  [name] [name] 1955/10/19 [id number]   Telephonic Care Management (Triage Screening) Note  Referral Date: 04/12/15 Referral Source: MD Referral Issue: CHF, 2 admissions and 1 ED visit in the past 12 months.  Insurance:  [facility] Primary MD:  Dr. [name] - last appt 05/03/15 Cardiologist:  Dr. Rozann Lesches  Outreach call #3. Patient reached.  Patient and wife on call together.    Social Patient lives in his home with wife.  Decreased mobility due to H/O past strokes x 2 and obesity.  Drags Right foot due to past stroke.  Ambulates with a cane.   Falls:  Last fall 10/14/14 outside Dr. Myles Gip office. No injury. Caregiver:  Wife, [name] [name] DME:  [name], walker.    CHF Weight: 359  - self reported as last MD office weight.  Height: 5'10 1/2  Scales at home but not working all the time.  Patient is not weighing daily.  Wife states patient has started a fluid pill but has noted a lot more swelling in legs when she wraps his legs everyday.  States she also has CHF.  BP cuff in the home but states it is not working either.  Wife states she plans to talk with Dr. Domenic Polite on her next appt in one month regarding she does not feel patient is on enough fluid medicine.   Medications Med reconciliation completed today 05/04/2015. States taking as prescribed and only misses a dose on occasion.  States co-pays are affordable.   Consent Patient consents to discuss PHI with wife as needed.  Patient reluctant to [place] RN CM services.   RN CM extended Telephonic or Community but advised that RN CM feels patients needs could be best addressed through [employer] at this time.  Patient requested he would prefer contact closer to the 7-10 day range and agreed to Home Visit.   RN CM advised patient high BP, weight, CHF complicates health risk for readmission and Stroke/MI.  Advised that patients symptoms may  warrant earlier MD intervention than one month out and [place] Nurse can help assess this need.   Plan:  RN CM sent referral to [place] RN CM Level 4 services.  RN CM will advise [facility] Administrative Assistant:  Agreed to services - Level 4.    [name] Laster, RN, BSN, [facility], CCM  [employer] Management Coordinator [phone] Direct [phone] Cell [phone] Office [phone] Fax

## 2015-05-07 ENCOUNTER — Other Ambulatory Visit: Payer: Self-pay | Admitting: *Deleted

## 2015-05-07 NOTE — Patient Outreach (Signed)
05/07/15- Telephone call to patient's home to schedule initial home visit and no answer to telephone, received unidentified voicemail,  Called patient's cell phone and no answer, states " mailbox is full"  RN CM will attempt again next week.  Jacqlyn Larsen [facility], [place] Coordinator [phone]

## 2015-05-13 ENCOUNTER — Encounter: Payer: Self-pay | Admitting: *Deleted

## 2015-05-13 ENCOUNTER — Other Ambulatory Visit: Payer: Self-pay | Admitting: *Deleted

## 2015-05-13 NOTE — Patient Outreach (Signed)
05/13/15-Telephone call to pt (2nd unsuccessful attempt), no answer to home  telephone and received unidentified voicemail, called cell phone as well and states "voicemail is full".  RN CM mailed letter to patient's home requesting pt contact RN CM, will attempt to reach pt again next week and keep case open for 10 days.  Jacqlyn Larsen [facility], [place] Coordinator [phone]

## 2015-05-19 ENCOUNTER — Ambulatory Visit: Payer: Self-pay | Admitting: *Deleted

## 2015-05-24 DIAGNOSIS — I5032 Chronic diastolic (congestive) heart failure: Secondary | ICD-10-CM | POA: Diagnosis not present

## 2015-05-25 ENCOUNTER — Other Ambulatory Visit: Payer: Self-pay | Admitting: *Deleted

## 2015-05-25 ENCOUNTER — Encounter: Payer: Self-pay | Admitting: *Deleted

## 2015-05-25 NOTE — Patient Outreach (Signed)
05/25/15- Referral received from telephonic case manager for community services.  Third attempt outreach to schedule initial home visit, pt answered telephone and says he has decided he "doesn't see the point of anyone coming out here"  Pt states his scales are now working and weighs every few days and does not need assistance or guidance with this aspect of his care.  Pt states he has lost 7-8 pounds and " needs to lose a whole lot more".  Pt reports he already has [facility] contact information for future reference and if he needs anything in the future or his health status changes, he will contact [facility], pt refuses services, case closed,  Letter mailed to primary MD Dr. Quintin Alto informing of case closure.  Jacqlyn Larsen [facility], [place] Coordinator [phone]

## 2015-05-27 NOTE — Patient Outreach (Signed)
[name] Care Management  05/27/2015  Hoyle Barkdull Nov 24, 1955 [id number]   Notification from Jacqlyn Larsen, RN to close case due to patient refused [place] [facility] services.  Thanks, Ronnell Freshwater. [place], [place] Assistant Phone: [phone] Fax: [phone]

## 2015-06-03 DIAGNOSIS — I10 Essential (primary) hypertension: Secondary | ICD-10-CM | POA: Diagnosis not present

## 2015-06-30 DIAGNOSIS — R0902 Hypoxemia: Secondary | ICD-10-CM | POA: Diagnosis not present

## 2015-06-30 DIAGNOSIS — R509 Fever, unspecified: Secondary | ICD-10-CM | POA: Diagnosis not present

## 2015-06-30 DIAGNOSIS — I5032 Chronic diastolic (congestive) heart failure: Secondary | ICD-10-CM | POA: Diagnosis not present

## 2015-06-30 DIAGNOSIS — Z6841 Body Mass Index (BMI) 40.0 and over, adult: Secondary | ICD-10-CM | POA: Diagnosis not present

## 2015-06-30 DIAGNOSIS — A401 Sepsis due to streptococcus, group B: Secondary | ICD-10-CM | POA: Diagnosis not present

## 2015-06-30 DIAGNOSIS — A419 Sepsis, unspecified organism: Secondary | ICD-10-CM | POA: Diagnosis not present

## 2015-06-30 DIAGNOSIS — N201 Calculus of ureter: Secondary | ICD-10-CM | POA: Diagnosis not present

## 2015-06-30 DIAGNOSIS — Z8673 Personal history of transient ischemic attack (TIA), and cerebral infarction without residual deficits: Secondary | ICD-10-CM | POA: Diagnosis not present

## 2015-06-30 DIAGNOSIS — R0602 Shortness of breath: Secondary | ICD-10-CM | POA: Diagnosis not present

## 2015-06-30 DIAGNOSIS — R112 Nausea with vomiting, unspecified: Secondary | ICD-10-CM | POA: Diagnosis not present

## 2015-07-01 DIAGNOSIS — I351 Nonrheumatic aortic (valve) insufficiency: Secondary | ICD-10-CM | POA: Diagnosis not present

## 2015-07-01 DIAGNOSIS — Z79899 Other long term (current) drug therapy: Secondary | ICD-10-CM | POA: Diagnosis not present

## 2015-07-01 DIAGNOSIS — R112 Nausea with vomiting, unspecified: Secondary | ICD-10-CM | POA: Diagnosis not present

## 2015-07-01 DIAGNOSIS — R7881 Bacteremia: Secondary | ICD-10-CM | POA: Diagnosis not present

## 2015-07-01 DIAGNOSIS — A419 Sepsis, unspecified organism: Secondary | ICD-10-CM | POA: Diagnosis not present

## 2015-07-01 DIAGNOSIS — I10 Essential (primary) hypertension: Secondary | ICD-10-CM | POA: Diagnosis not present

## 2015-07-01 DIAGNOSIS — R931 Abnormal findings on diagnostic imaging of heart and coronary circulation: Secondary | ICD-10-CM | POA: Diagnosis not present

## 2015-07-01 DIAGNOSIS — Z7982 Long term (current) use of aspirin: Secondary | ICD-10-CM | POA: Diagnosis not present

## 2015-07-01 DIAGNOSIS — Z8673 Personal history of transient ischemic attack (TIA), and cerebral infarction without residual deficits: Secondary | ICD-10-CM | POA: Diagnosis not present

## 2015-07-01 DIAGNOSIS — A401 Sepsis due to streptococcus, group B: Secondary | ICD-10-CM | POA: Diagnosis not present

## 2015-07-01 DIAGNOSIS — I517 Cardiomegaly: Secondary | ICD-10-CM | POA: Diagnosis not present

## 2015-07-01 DIAGNOSIS — N201 Calculus of ureter: Secondary | ICD-10-CM | POA: Diagnosis not present

## 2015-07-01 DIAGNOSIS — Z7902 Long term (current) use of antithrombotics/antiplatelets: Secondary | ICD-10-CM | POA: Diagnosis not present

## 2015-07-01 DIAGNOSIS — N200 Calculus of kidney: Secondary | ICD-10-CM | POA: Diagnosis not present

## 2015-07-01 DIAGNOSIS — Z6841 Body Mass Index (BMI) 40.0 and over, adult: Secondary | ICD-10-CM | POA: Diagnosis not present

## 2015-07-01 DIAGNOSIS — E790 Hyperuricemia without signs of inflammatory arthritis and tophaceous disease: Secondary | ICD-10-CM | POA: Diagnosis present

## 2015-07-01 DIAGNOSIS — I110 Hypertensive heart disease with heart failure: Secondary | ICD-10-CM | POA: Diagnosis present

## 2015-07-01 DIAGNOSIS — N390 Urinary tract infection, site not specified: Secondary | ICD-10-CM | POA: Diagnosis not present

## 2015-07-01 DIAGNOSIS — Z87442 Personal history of urinary calculi: Secondary | ICD-10-CM | POA: Diagnosis not present

## 2015-07-01 DIAGNOSIS — I5032 Chronic diastolic (congestive) heart failure: Secondary | ICD-10-CM | POA: Diagnosis present

## 2015-07-03 DIAGNOSIS — I10 Essential (primary) hypertension: Secondary | ICD-10-CM | POA: Diagnosis not present

## 2015-07-13 DIAGNOSIS — E119 Type 2 diabetes mellitus without complications: Secondary | ICD-10-CM | POA: Diagnosis not present

## 2015-07-13 DIAGNOSIS — E7800 Pure hypercholesterolemia, unspecified: Secondary | ICD-10-CM | POA: Diagnosis not present

## 2015-07-13 DIAGNOSIS — I10 Essential (primary) hypertension: Secondary | ICD-10-CM | POA: Diagnosis not present

## 2015-07-16 ENCOUNTER — Other Ambulatory Visit: Payer: Self-pay | Admitting: Urology

## 2015-07-16 DIAGNOSIS — N201 Calculus of ureter: Secondary | ICD-10-CM | POA: Diagnosis not present

## 2015-07-20 DIAGNOSIS — E1121 Type 2 diabetes mellitus with diabetic nephropathy: Secondary | ICD-10-CM | POA: Diagnosis not present

## 2015-07-20 DIAGNOSIS — M109 Gout, unspecified: Secondary | ICD-10-CM | POA: Diagnosis not present

## 2015-07-20 DIAGNOSIS — E1129 Type 2 diabetes mellitus with other diabetic kidney complication: Secondary | ICD-10-CM | POA: Diagnosis not present

## 2015-07-20 DIAGNOSIS — I10 Essential (primary) hypertension: Secondary | ICD-10-CM | POA: Diagnosis not present

## 2015-07-20 DIAGNOSIS — Z23 Encounter for immunization: Secondary | ICD-10-CM | POA: Diagnosis not present

## 2015-07-26 NOTE — Patient Instructions (Signed)
SONIA STICKELS  07/26/2015   Your procedure is scheduled on:   08/02/2015    Report to [facility]  Entrance take [place]  elevators to 3rd floor to  [place] at    1020 AM.  Call this number if you have problems the morning of surgery [phone]   Remember: ONLY 1 PERSON MAY GO WITH YOU TO SHORT STAY TO GET  READY MORNING OF [place].  Do not eat food or drink liquids :After Midnight.     Take these medicines the morning of surgery with A SIP OF WATER:  Allopurinol, hydralazine ( apresoline), metoprolol ( toprol), flomax   DO NOT TAKE ANY DIABETIC MEDICATIONS DAY OF YOUR SURGERY                               You may not have any metal on your body including hair pins and              piercings  Do not wear jewelry, , lotions, powders or perfumes, deodorant                        Men may shave face and neck.   Do not bring valuables to the hospital. [place].  Contacts, dentures or bridgework may not be worn into surgery.      Patients discharged the day of surgery will not be allowed to drive home.  Name and phone number of your driver:  Special Instructions: coughing and  Deep breathing exercises, leg exercises               Please read over the following fact sheets you were given: _____________________________________________________________________             [facility] - Preparing for Surgery Before surgery, you can play an important role.  Because skin is not sterile, your skin needs to be as free of germs as possible.  You can reduce the number of germs on your skin by washing with CHG (chlorahexidine gluconate) soap before surgery.  CHG is an antiseptic cleaner which kills germs and bonds with the skin to continue killing germs even after washing. Please DO NOT use if you have an allergy to CHG or antibacterial soaps.  If your skin becomes reddened/irritated stop using the CHG  and inform your nurse when you arrive at Short Stay. Do not shave (including legs and underarms) for at least 48 hours prior to the first CHG shower.  You may shave your face/neck. Please follow these instructions carefully:  1.  Shower with CHG Soap the night before surgery and the  morning of Surgery.  2.  If you choose to wash your hair, wash your hair first as usual with your  normal  shampoo.  3.  After you shampoo, rinse your hair and body thoroughly to remove the  shampoo.                           4.  Use CHG as you would any other liquid soap.  You can apply chg directly  to the skin and wash  Gently with a scrungie or clean washcloth.  5.  Apply the CHG Soap to your body ONLY FROM THE NECK DOWN.   Do not use on face/ open                           Wound or open sores. Avoid contact with eyes, ears mouth and genitals (private parts).                       Wash face,  Genitals (private parts) with your normal soap.             6.  Wash thoroughly, paying special attention to the area where your surgery  will be performed.  7.  Thoroughly rinse your body with warm water from the neck down.  8.  DO NOT shower/wash with your normal soap after using and rinsing off  the CHG Soap.                9.  Pat yourself dry with a clean towel.            10.  Wear clean pajamas.            11.  Place clean sheets on your bed the night of your first shower and do not  sleep with pets. Day of Surgery : Do not apply any lotions/deodorants the morning of surgery.  Please wear clean clothes to the hospital/surgery center.  FAILURE TO FOLLOW THESE INSTRUCTIONS MAY RESULT IN THE CANCELLATION OF YOUR SURGERY PATIENT SIGNATURE_________________________________  NURSE SIGNATURE__________________________________  ________________________________________________________________________

## 2015-07-28 ENCOUNTER — Encounter (HOSPITAL_COMMUNITY)
Admission: RE | Admit: 2015-07-28 | Discharge: 2015-07-28 | Disposition: A | Discharge status: Home / Self Care | Payer: Medicare Other | Source: Ambulatory Visit | Attending: Urology | Admitting: Urology

## 2015-07-28 ENCOUNTER — Ambulatory Visit (HOSPITAL_COMMUNITY)
Admission: RE | Admit: 2015-07-28 | Discharge: 2015-07-28 | Disposition: A | Discharge status: Home / Self Care | Payer: Medicare Other | Source: Ambulatory Visit | Attending: Anesthesiology | Admitting: Anesthesiology

## 2015-07-28 ENCOUNTER — Encounter (HOSPITAL_COMMUNITY): Payer: Self-pay

## 2015-07-28 DIAGNOSIS — J61 Pneumoconiosis due to asbestos and other mineral fibers: Secondary | ICD-10-CM | POA: Diagnosis not present

## 2015-07-28 DIAGNOSIS — I517 Cardiomegaly: Secondary | ICD-10-CM | POA: Diagnosis not present

## 2015-07-28 DIAGNOSIS — Z01818 Encounter for other preprocedural examination: Secondary | ICD-10-CM | POA: Insufficient documentation

## 2015-07-28 DIAGNOSIS — Z01812 Encounter for preprocedural laboratory examination: Secondary | ICD-10-CM | POA: Diagnosis not present

## 2015-07-28 DIAGNOSIS — R918 Other nonspecific abnormal finding of lung field: Secondary | ICD-10-CM | POA: Diagnosis not present

## 2015-07-28 LAB — CBC
HCT: 40.5 % (ref 39.0–52.0)
HEMOGLOBIN: 13.1 g/dL (ref 13.0–17.0)
MCH: 27.9 pg (ref 26.0–34.0)
MCHC: 32.3 g/dL (ref 30.0–36.0)
MCV: 86.4 fL (ref 78.0–100.0)
Platelets: 142 10*3/uL — ABNORMAL LOW (ref 150–400)
RBC: 4.69 MIL/uL (ref 4.22–5.81)
RDW: 15.9 % — ABNORMAL HIGH (ref 11.5–15.5)
WBC: 5.5 10*3/uL (ref 4.0–10.5)

## 2015-07-28 LAB — BASIC METABOLIC PANEL
ANION GAP: 9 (ref 5–15)
BUN: 21 mg/dL — ABNORMAL HIGH (ref 6–20)
CALCIUM: 8.6 mg/dL — AB (ref 8.9–10.3)
CHLORIDE: 106 mmol/L (ref 101–111)
CO2: 25 mmol/L (ref 22–32)
Creatinine, Ser: 0.92 mg/dL (ref 0.61–1.24)
GFR calc non Af Amer: >60 mL/min (ref >60)
Glucose, Bld: 129 mg/dL — ABNORMAL HIGH (ref 65–99)
Potassium: 3.8 mmol/L (ref 3.5–5.1)
SODIUM: 140 mmol/L (ref 135–145)

## 2015-07-28 NOTE — Progress Notes (Signed)
EKG-09/23/2014 - EPIC  ECHO- 10/05/2014- EPIC  04/08/15- LOV- Dr Domenic Polite - EP[facility]

## 2015-07-28 NOTE — Progress Notes (Signed)
CXR done 07/28/15 faxed via EPIC to Dr Nicolette Bang.

## 2015-07-28 NOTE — Progress Notes (Signed)
BMP done 07/28/2015 faxed via EPIC to Dr Alyson Ingles.

## 2015-07-28 NOTE — Progress Notes (Signed)
Dr Ola Spurr ( anesthesia) shown CXR results from 07/28/15.  Made aware of previous medical history and vital signs today.  Marland Kitchen  LOV with Cardiology 04/08/15 in EPIC along with EKG done 09/2014 in [facility].  No further orders given.

## 2015-08-01 MED ORDER — DEXTROSE 5 % IV SOLN
3.0000 g | INTRAVENOUS | Status: AC
  Administered 2015-08-02: 3 g via INTRAVENOUS
  Filled 2015-08-01: qty 3000

## 2015-08-02 ENCOUNTER — Ambulatory Visit (HOSPITAL_COMMUNITY): Payer: Medicare Other | Admitting: Anesthesiology

## 2015-08-02 ENCOUNTER — Encounter (HOSPITAL_COMMUNITY): Payer: Self-pay | Admitting: Anesthesiology

## 2015-08-02 ENCOUNTER — Encounter (HOSPITAL_COMMUNITY)
Admission: RE | Disposition: A | Discharge status: Home / Self Care | Payer: Self-pay | Source: Ambulatory Visit | Attending: Urology

## 2015-08-02 ENCOUNTER — Ambulatory Visit (HOSPITAL_COMMUNITY)
Admission: RE | Admit: 2015-08-02 | Discharge: 2015-08-02 | Disposition: A | Discharge status: Home / Self Care | Payer: Medicare Other | Source: Ambulatory Visit | Attending: Urology | Admitting: Urology

## 2015-08-02 DIAGNOSIS — Z7902 Long term (current) use of antithrombotics/antiplatelets: Secondary | ICD-10-CM | POA: Insufficient documentation

## 2015-08-02 DIAGNOSIS — G4733 Obstructive sleep apnea (adult) (pediatric): Secondary | ICD-10-CM | POA: Insufficient documentation

## 2015-08-02 DIAGNOSIS — I739 Peripheral vascular disease, unspecified: Secondary | ICD-10-CM | POA: Diagnosis not present

## 2015-08-02 DIAGNOSIS — Z87442 Personal history of urinary calculi: Secondary | ICD-10-CM | POA: Diagnosis not present

## 2015-08-02 DIAGNOSIS — Z7982 Long term (current) use of aspirin: Secondary | ICD-10-CM | POA: Insufficient documentation

## 2015-08-02 DIAGNOSIS — I110 Hypertensive heart disease with heart failure: Secondary | ICD-10-CM | POA: Insufficient documentation

## 2015-08-02 DIAGNOSIS — I69359 Hemiplegia and hemiparesis following cerebral infarction affecting unspecified side: Secondary | ICD-10-CM | POA: Diagnosis not present

## 2015-08-02 DIAGNOSIS — Z791 Long term (current) use of non-steroidal anti-inflammatories (NSAID): Secondary | ICD-10-CM | POA: Diagnosis not present

## 2015-08-02 DIAGNOSIS — I10 Essential (primary) hypertension: Secondary | ICD-10-CM | POA: Diagnosis not present

## 2015-08-02 DIAGNOSIS — R109 Unspecified abdominal pain: Secondary | ICD-10-CM | POA: Diagnosis present

## 2015-08-02 DIAGNOSIS — N201 Calculus of ureter: Secondary | ICD-10-CM | POA: Insufficient documentation

## 2015-08-02 DIAGNOSIS — Z6841 Body Mass Index (BMI) 40.0 and over, adult: Secondary | ICD-10-CM | POA: Insufficient documentation

## 2015-08-02 DIAGNOSIS — I509 Heart failure, unspecified: Secondary | ICD-10-CM | POA: Insufficient documentation

## 2015-08-02 DIAGNOSIS — Z79899 Other long term (current) drug therapy: Secondary | ICD-10-CM | POA: Diagnosis not present

## 2015-08-02 HISTORY — PX: HOLMIUM LASER APPLICATION: SHX5852

## 2015-08-02 HISTORY — PX: CYSTOSCOPY WITH RETROGRADE PYELOGRAM, URETEROSCOPY AND STENT PLACEMENT: SHX5789

## 2015-08-02 SURGERY — CYSTOURETEROSCOPY, WITH RETROGRADE PYELOGRAM AND STENT INSERTION
Anesthesia: General | Site: Urethra | Laterality: Right

## 2015-08-02 MED ORDER — TAMSULOSIN HCL 0.4 MG PO CAPS: 0.4000 mg | ORAL_CAPSULE | Freq: Every day | ORAL | Status: DC

## 2015-08-02 MED ORDER — SUGAMMADEX SODIUM 500 MG/5ML IV SOLN
INTRAVENOUS | Status: AC
  Filled 2015-08-02: qty 5

## 2015-08-02 MED ORDER — PROPOFOL 10 MG/ML IV BOLUS
INTRAVENOUS | Status: AC
  Filled 2015-08-02: qty 20

## 2015-08-02 MED ORDER — ALBUTEROL SULFATE HFA 108 (90 BASE) MCG/ACT IN AERS
INHALATION_SPRAY | RESPIRATORY_TRACT | Status: AC
  Filled 2015-08-02: qty 6.7

## 2015-08-02 MED ORDER — FENTANYL CITRATE (PF) 100 MCG/2ML IJ SOLN
INTRAMUSCULAR | Status: AC
  Filled 2015-08-02: qty 4

## 2015-08-02 MED ORDER — SUGAMMADEX SODIUM 200 MG/2ML IV SOLN
INTRAVENOUS | Status: DC | PRN
  Administered 2015-08-02: 350 mg via INTRAVENOUS

## 2015-08-02 MED ORDER — PHENYLEPHRINE 40 MCG/ML (10ML) SYRINGE FOR IV PUSH (FOR BLOOD PRESSURE SUPPORT)
PREFILLED_SYRINGE | INTRAVENOUS | Status: AC
  Filled 2015-08-02: qty 10

## 2015-08-02 MED ORDER — FENTANYL CITRATE (PF) 100 MCG/2ML IJ SOLN
INTRAMUSCULAR | Status: DC | PRN
Start: 2015-08-02 — End: 2015-08-02
  Administered 2015-08-02 (×2): 50 ug via INTRAVENOUS

## 2015-08-02 MED ORDER — MIDAZOLAM HCL 2 MG/2ML IJ SOLN
INTRAMUSCULAR | Status: AC
  Filled 2015-08-02: qty 2

## 2015-08-02 MED ORDER — SODIUM CHLORIDE 0.9 % IR SOLN
Status: DC | PRN
  Administered 2015-08-02: 3500 mL via INTRAVESICAL

## 2015-08-02 MED ORDER — ONDANSETRON HCL 4 MG/2ML IJ SOLN: 4.0000 mg | Freq: Once | INTRAMUSCULAR | Status: DC | PRN

## 2015-08-02 MED ORDER — ONDANSETRON HCL 4 MG/2ML IJ SOLN
INTRAMUSCULAR | Status: AC
  Filled 2015-08-02: qty 2

## 2015-08-02 MED ORDER — PHENYLEPHRINE HCL 10 MG/ML IJ SOLN
INTRAMUSCULAR | Status: DC | PRN
  Administered 2015-08-02 (×2): 80 ug via INTRAVENOUS

## 2015-08-02 MED ORDER — PROPOFOL 10 MG/ML IV BOLUS
INTRAVENOUS | Status: DC | PRN
  Administered 2015-08-02: 200 mg via INTRAVENOUS
  Administered 2015-08-02: 70 mg via INTRAVENOUS

## 2015-08-02 MED ORDER — MIDAZOLAM HCL 5 MG/5ML IJ SOLN
INTRAMUSCULAR | Status: DC | PRN
  Administered 2015-08-02 (×2): 1 mg via INTRAVENOUS

## 2015-08-02 MED ORDER — LIDOCAINE HCL (CARDIAC) 20 MG/ML IV SOLN
INTRAVENOUS | Status: AC
  Filled 2015-08-02: qty 5

## 2015-08-02 MED ORDER — LACTATED RINGERS IV SOLN
INTRAVENOUS | Status: DC
  Administered 2015-08-02: 1000 mL via INTRAVENOUS

## 2015-08-02 MED ORDER — ALBUTEROL SULFATE HFA 108 (90 BASE) MCG/ACT IN AERS
INHALATION_SPRAY | RESPIRATORY_TRACT | Status: DC | PRN
  Administered 2015-08-02: 5 via RESPIRATORY_TRACT

## 2015-08-02 MED ORDER — ONDANSETRON HCL 4 MG/2ML IJ SOLN
INTRAMUSCULAR | Status: DC | PRN
  Administered 2015-08-02: 4 mg via INTRAVENOUS

## 2015-08-02 MED ORDER — EPHEDRINE SULFATE 50 MG/ML IJ SOLN
INTRAMUSCULAR | Status: DC | PRN
  Administered 2015-08-02 (×4): 10 mg via INTRAVENOUS

## 2015-08-02 MED ORDER — LIDOCAINE HCL (CARDIAC) 20 MG/ML IV SOLN
INTRAVENOUS | Status: DC | PRN
  Administered 2015-08-02: 100 mg via INTRAVENOUS

## 2015-08-02 MED ORDER — OXYCODONE-ACETAMINOPHEN 5-325 MG PO TABS: 1.0000 | ORAL_TABLET | ORAL | Status: DC | PRN

## 2015-08-02 MED ORDER — FENTANYL CITRATE (PF) 100 MCG/2ML IJ SOLN: 25.0000 ug | INTRAMUSCULAR | Status: DC | PRN

## 2015-08-02 MED ORDER — IOHEXOL 300 MG/ML  SOLN
INTRAMUSCULAR | Status: DC | PRN
  Administered 2015-08-02: 6 mL via URETHRAL

## 2015-08-02 MED ORDER — ROCURONIUM BROMIDE 100 MG/10ML IV SOLN
INTRAVENOUS | Status: DC | PRN
  Administered 2015-08-02: 60 mg via INTRAVENOUS

## 2015-08-02 MED ORDER — ROCURONIUM 10MG/ML (10ML) SYRINGE FOR MEDFUSION PUMP - OPTIME
INTRAVENOUS | Status: DC | PRN
  Administered 2015-08-02: 60 mg via INTRAVENOUS

## 2015-08-02 SURGICAL SUPPLY — 20 items
BAG URO CATCHER STRL LF (DRAPE) ×4 IMPLANT
BASKET STONE 1.7 NGAGE (UROLOGICAL SUPPLIES) ×2 IMPLANT
CATH INTERMIT  6FR 70CM (CATHETERS) ×4 IMPLANT
CLOTH BEACON ORANGE TIMEOUT ST (SAFETY) ×4 IMPLANT
FIBER LASER FLEXIVA 1000 (UROLOGICAL SUPPLIES) IMPLANT
FIBER LASER FLEXIVA 200 (UROLOGICAL SUPPLIES) ×2 IMPLANT
FIBER LASER FLEXIVA 365 (UROLOGICAL SUPPLIES) IMPLANT
FIBER LASER FLEXIVA 550 (UROLOGICAL SUPPLIES) IMPLANT
FIBER LASER TRAC TIP (UROLOGICAL SUPPLIES) IMPLANT
GLOVE BIOGEL M STRL SZ7.5 (GLOVE) ×4 IMPLANT
GOWN STRL REUS W/TWL LRG LVL3 (GOWN DISPOSABLE) ×8 IMPLANT
GUIDEWIRE ANG ZIPWIRE 038X150 (WIRE) ×2 IMPLANT
GUIDEWIRE STR DUAL SENSOR (WIRE) ×4 IMPLANT
MANIFOLD NEPTUNE II (INSTRUMENTS) ×4 IMPLANT
PACK CYSTO (CUSTOM PROCEDURE TRAY) ×4 IMPLANT
SHEATH ACCESS URETERAL 38CM (SHEATH) IMPLANT
STENT CONTOUR 6FRX26X.038 (STENTS) ×2 IMPLANT
TUBE FEEDING 5FR 36IN KANGAROO (TUBING) ×2 IMPLANT
TUBING CONNECTING 10 (TUBING) ×3 IMPLANT
TUBING CONNECTING 10' (TUBING) ×1

## 2015-08-02 NOTE — Anesthesia Preprocedure Evaluation (Signed)
Anesthesia Evaluation  Patient identified by MRN, date of birth, ID band Patient awake    Reviewed: Allergy & Precautions, NPO status , Patient's Chart, lab work & pertinent test results, reviewed documented beta blocker date and time   History of Anesthesia Complications Negative for: history of anesthetic complications  Airway Mallampati: III  TM Distance: >3 FB Neck ROM: Full    Dental  (+) Teeth Intact, Dental Advisory Given   Pulmonary shortness of breath and with exertion, sleep apnea ,    Pulmonary exam normal breath sounds clear to auscultation       Cardiovascular Exercise Tolerance: Poor hypertension, Pt. on medications and Pt. on home beta blockers + Peripheral Vascular Disease and +CHF  Normal cardiovascular exam Rhythm:Regular Rate:Normal     Neuro/Psych CVA (right sided weakness), Residual Symptoms negative psych ROS   GI/Hepatic negative GI ROS, Neg liver ROS,   Endo/Other  Morbid obesity (BMI of 50)  Renal/GU negative Renal ROS     Musculoskeletal negative musculoskeletal ROS (+)   Abdominal (+) + obese,   Peds  Hematology negative hematology ROS (+)   Anesthesia Other Findings Day of surgery medications reviewed with the patient.  Reproductive/Obstetrics                             Anesthesia Physical Anesthesia Plan  ASA: III  Anesthesia Plan: General   Post-op Pain Management:    Induction: Intravenous  Airway Management Planned: Oral ETT  Additional Equipment:   Intra-op Plan:   Post-operative Plan: Extubation in OR  Informed Consent: I have reviewed the patients History and Physical, chart, labs and discussed the procedure including the risks, benefits and alternatives for the proposed anesthesia with the patient or authorized representative who has indicated his/her understanding and acceptance.   Dental advisory given  Plan Discussed with:  CRNA  Anesthesia Plan Comments: (Risks/benefits of general anesthesia discussed with patient including risk of damage to teeth, lips, gum, and tongue, nausea/vomiting, allergic reactions to medications, and the possibility of heart attack, stroke and death.  All patient questions answered.  Patient wishes to proceed.)        Anesthesia Quick Evaluation

## 2015-08-02 NOTE — Anesthesia Procedure Notes (Signed)
Procedure Name: Intubation Date/Time: 08/02/2015 12:39 PM Performed by: Maxwell Caul Pre-anesthesia Checklist: Patient identified, Emergency Drugs available, Suction available and Patient being monitored Patient Re-evaluated:Patient Re-evaluated prior to inductionOxygen Delivery Method: Circle System Utilized Preoxygenation: Pre-oxygenation with 100% oxygen Intubation Type: IV induction Ventilation: Mask ventilation without difficulty, Oral airway inserted - appropriate to patient size and Two handed mask ventilation required Laryngoscope Size: Mac and 4 Grade View: Grade I Tube type: Oral Tube size: 7.5 mm Number of attempts: 1 Airway Equipment and Method: Stylet and Oral airway Placement Confirmation: ETT inserted through vocal cords under direct vision,  positive ETCO2 and breath sounds checked- equal and bilateral Secured at: 25 cm Tube secured with: Tape Dental Injury: Teeth and Oropharynx as per pre-operative assessment

## 2015-08-02 NOTE — Brief Op Note (Signed)
08/02/2015  1:35 PM  PATIENT:  [name] [name]  59 y.o. male  PRE-OPERATIVE DIAGNOSIS:  RIGHT URETERAL CALCULUS  POST-OPERATIVE DIAGNOSIS:  RIGHT URETERAL CALCULUS  PROCEDURE:  Procedure(s): CYSTOSCOPY WITH RETROGRADE PYELOGRAM, URETEROSCOPY , STONE EXTRACTION AND STENT PLACEMENT (Right) HOLMIUM LASER APPLICATION (Right)  SURGEON:  Surgeon(s) and Role:    * Cleon Gustin, MD - Primary  PHYSICIAN ASSISTANT:   ASSISTANTS: none   ANESTHESIA:   general  EBL:     BLOOD ADMINISTERED:none  DRAINS: right 6x26 JJ stent without tether  LOCAL MEDICATIONS USED:  NONE  SPECIMEN:  Source of Specimen:  stone  DISPOSITION OF SPECIMEN:  N/A  COUNTS:  YES  TOURNIQUET:  * No tourniquets in log *  DICTATION: .Note written in [place]: Discharge to home after PACU  PATIENT DISPOSITION:  PACU - hemodynamically stable.   Delay start of Pharmacological VTE agent (>24hrs) due to surgical blood loss or risk of bleeding: not applicable

## 2015-08-02 NOTE — Discharge Instructions (Signed)
Kidney Stones °Kidney stones (urolithiasis) are deposits that form inside your kidneys. The intense pain is caused by the stone moving through the urinary tract. When the stone moves, the ureter goes into spasm around the stone. The stone is usually passed in the urine.  °CAUSES  °· A disorder that makes certain neck glands produce too much parathyroid hormone (primary hyperparathyroidism). °· A buildup of uric acid crystals, similar to gout in your joints. °· Narrowing (stricture) of the ureter. °· A kidney obstruction present at birth (congenital obstruction). °· Previous surgery on the kidney or ureters. °· Numerous kidney infections. °SYMPTOMS  °· Feeling sick to your stomach (nauseous). °· Throwing up (vomiting). °· Blood in the urine (hematuria). °· Pain that usually spreads (radiates) to the groin. °· Frequency or urgency of urination. °DIAGNOSIS  °· Taking a history and physical exam. °· Blood or urine tests. °· CT scan. °· Occasionally, an examination of the inside of the urinary bladder (cystoscopy) is performed. °TREATMENT  °· Observation. °· Increasing your fluid intake. °· Extracorporeal shock wave lithotripsy--This is a noninvasive procedure that uses shock waves to break up kidney stones. °· Surgery may be needed if you have severe pain or persistent obstruction. There are various surgical procedures. Most of the procedures are performed with the use of small instruments. Only small incisions are needed to accommodate these instruments, so recovery time is minimized. °The size, location, and chemical composition are all important variables that will determine the proper choice of action for you. Talk to your health care provider to better understand your situation so that you will minimize the risk of injury to yourself and your kidney.  °HOME CARE INSTRUCTIONS  °· Drink enough water and fluids to keep your urine clear or pale yellow. This will help you to pass the stone or stone fragments. °· Strain  all urine through the provided strainer. Keep all particulate matter and stones for your health care provider to see. The stone causing the pain may be as small as a grain of salt. It is very important to use the strainer each and every time you pass your urine. The collection of your stone will allow your health care provider to analyze it and verify that a stone has actually passed. The stone analysis will often identify what you can do to reduce the incidence of recurrences. °· Only take over-the-counter or prescription medicines for pain, discomfort, or fever as directed by your health care provider. °· Keep all follow-up visits as told by your health care provider. This is important. °· Get follow-up X-rays if required. The absence of pain does not always mean that the stone has passed. It may have only stopped moving. If the urine remains completely obstructed, it can cause loss of kidney function or even complete destruction of the kidney. It is your responsibility to make sure X-rays and follow-ups are completed. Ultrasounds of the kidney can show blockages and the status of the kidney. Ultrasounds are not associated with any radiation and can be performed easily in a matter of minutes. °· Make changes to your daily diet as told by your health care provider. You may be told to: °¨ Limit the amount of salt that you eat. °¨ Eat 5 or more servings of fruits and vegetables each day. °¨ Limit the amount of meat, poultry, fish, and eggs that you eat. °· Collect a 24-hour urine sample as told by your health care provider. You may need to collect another urine sample every 6-12   months. SEEK MEDICAL CARE IF:  You experience pain that is progressive and unresponsive to any pain medicine you have been prescribed. SEEK IMMEDIATE MEDICAL CARE IF:   Pain cannot be controlled with the prescribed medicine.  You have a fever or shaking chills.  The severity or intensity of pain increases over 18 hours and is not  relieved by pain medicine.  You develop a new onset of abdominal pain.  You feel faint or pass out.  You are unable to urinate.   This information is not intended to replace advice given to you by your health care provider. Make sure you discuss any questions you have with your health care provider.   Document Released: 09/04/2005 Document Revised: 05/26/2015 Document Reviewed: 02/05/2013 Elsevier Interactive Patient Education 2016 [place]. Cystoscopy, Care After Refer to this sheet in the next few weeks. These instructions provide you with information on caring for yourself after your procedure. Your caregiver may also give you more specific instructions. Your treatment has been planned according to current medical practices, but problems sometimes occur. Call your caregiver if you have any problems or questions after your procedure. HOME CARE INSTRUCTIONS  Things you can do to ease any discomfort after your procedure include:  Drinking enough water and fluids to keep your urine clear or pale yellow.  Taking a warm bath to relieve any burning feelings. SEEK IMMEDIATE MEDICAL CARE IF:   You have an increase in blood in your urine.  You notice blood clots in your urine.  You have difficulty passing urine.  You have the chills.  You have abdominal pain.  You have a fever or persistent symptoms for more than 2-3 days.  You have a fever and your symptoms suddenly get worse. MAKE SURE YOU:   Understand these instructions.  Will watch your condition.  Will get help right away if you are not doing well or get worse.   This information is not intended to replace advice given to you by your health care provider. Make sure you discuss any questions you have with your health care provider.   Document Released: 03/24/2005 Document Revised: 09/25/2014 Document Reviewed: 02/26/2012 Elsevier Interactive Patient Education [employer].

## 2015-08-02 NOTE — H&P (Signed)
Urology Admission H&P  Chief Complaint: right abdominal pain  History of Present Illness: [name] [name] is a 59yo with right abdominal pain and had a CT scan which showed a 62mm R UVJ stone. He denies LUTS. He denies gross hematuria  Past Medical History  Diagnosis Date  . TIA (transient ischemic attack)   . History of stroke   . History of nephrolithiasis   . Essential hypertension   . 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 Varicose veins     Laser ablation of left great saphenous vein 2009  . History of open leg wound     no longer any open leg wounds - since 2015   . Stroke ([place]) 03/20/2003     right sided weakness   . Obstructive sleep apnea     no CPAP   . Shortness of breath dyspnea     with exertion    Past Surgical History  Procedure Laterality Date  . Vein surgery Left 06/2010    Laser ablation  . Kidney stone surgery    . Vasectomy  1984    Home Medications:  Prescriptions prior to admission  Medication Sig Dispense Refill Last Dose  . acetaminophen (TYLENOL) 500 MG tablet Take 1,000 mg by mouth daily as needed (pain).   Past Week at Unknown time  . allopurinol (ZYLOPRIM) 300 MG tablet Take 300 mg by mouth daily.   08/02/2015 at 0700  . Ascorbic Acid (VITAMIN C) 1000 MG tablet Take 6,000 mg by mouth daily as needed (when a cold is coming on.).   Past Month at Unknown time  . aspirin EC 325 MG tablet Take 325 mg by mouth daily.   08/01/2015 at am  . atorvastatin (LIPITOR) 10 MG tablet Take 10 mg by mouth daily.   08/01/2015 at am  . clopidogrel (PLAVIX) 75 MG tablet Take 75 mg by mouth daily.   08/01/2015 at 0700  . furosemide (LASIX) 40 MG tablet Take 40 mg by mouth 2 (two) times daily.    08/01/2015 at 2100  . hydrALAZINE (APRESOLINE) 25 MG tablet Take 25 mg by mouth daily.    08/02/2015 at 0700  . ibuprofen (ADVIL,MOTRIN) 200 MG tablet Take 400-600 mg by mouth daily as needed (pain).    Past Month at Unknown time  . metoprolol succinate (TOPROL-XL) 100 MG 24 hr tablet Take 100 mg by  mouth daily. Take with or immediately following a meal.   08/02/2015 at 0700  . potassium chloride SA (K-DUR,KLOR-CON) 20 MEQ tablet Take 20 mEq by mouth 2 (two) times daily.   08/01/2015 at am  . tamsulosin (FLOMAX) 0.4 MG CAPS capsule Take 0.4 mg by mouth daily.   08/02/2015 at 0700  . valsartan-hydrochlorothiazide (DIOVAN-HCT) 320-25 MG per tablet Take 1 tablet by mouth daily.   08/01/2015 at am   Allergies: No Known Allergies  Family History  Problem Relation Age of Onset  . Bone cancer Mother   . Hypertension Father   . Pancreatic cancer Maternal Grandmother   . Stroke Paternal Grandmother   . Diabetes Paternal Grandfather   . Heart attack Brother   . Hypertension Brother   . Heart attack Sister    Social History:  reports that he has never smoked. He has never used smokeless tobacco. He reports that he does not drink alcohol or use illicit drugs.  Review of Systems  All other systems reviewed and are negative.   Physical Exam:  Vital signs in last 24 hours: Temp:  [97.5 F (  36.4 C)] 97.5 F (36.4 C) (11/14 1103) Pulse Rate:  [65] 65 (11/14 1103) Resp:  [20] 20 (11/14 1103) BP: (130)/(74) 130/74 mmHg (11/14 1103) SpO2:  [98 %] 98 % (11/14 1103) Weight:  [161.027 kg (355 lb)] 161.027 kg (355 lb) (11/14 1103) Physical Exam  Constitutional: He is oriented to person, place, and time. He appears well-developed and well-nourished.  HENT:  Head: Normocephalic and atraumatic.  Eyes: EOM are normal. Pupils are equal, round, and reactive to light.  Neck: Normal range of motion. No thyromegaly present.  Cardiovascular: Normal rate and regular rhythm.   Respiratory: Effort normal. No respiratory distress.  GI: Soft. He exhibits no distension.  Musculoskeletal: Normal range of motion.  Neurological: He is alert and oriented to person, place, and time.  Skin: Skin is warm and dry.  Psychiatric: He has a normal mood and affect. His behavior is normal. Judgment and thought content  normal.    Laboratory Data:  No results found for this or any previous visit (from the past 24 hour(s)). No results found for this or any previous visit (from the past 240 hour(s)). Creatinine:  Recent Labs  07/28/15 1015  CREATININE 0.92   Baseline Creatinine: 0.9  Impression/Assessment:  59yo with a right ureteral calculus  Plan:  The risks/benefits/alternatives to cysto, right retrograde, right ureteroscopic stone extraction was explained to the patient and he understands and wishes to proceed with surgery  Stefany Starace L 08/02/2015, 12:15 PM

## 2015-08-02 NOTE — Op Note (Signed)
Preoperative diagnosis: Right ureteral stone  Postoperative diagnosis: Same  Procedure: 1 cystoscopy 2 right retrograde pyelography 3.  Intraoperative fluoroscopy, under one hour, with interpretation 4.  Right ureteroscopic stone manipulation with laser lithotripsy 5.  Right 6 x 26 JJ stent placement  Attending: Rosie Fate  Anesthesia: General  Estimated blood loss: None  Drains: Right 6 x 26 JJ ureteral stent without tether  Specimens: stone  Antibiotics: ancef  Findings: Right UVJ calculus. Severe edema and impaction of stone. Mild hydronephrosis.  Indications: Patient is a 59 year old male/male with a history of ureteral stone and who has failed medical expulsive therapy.  After discussing treatment options, he decided proceed with right ureteroscopic stone manipulation.  Procedure her in detail: The patient was brought to the operating room and a brief timeout was done to ensure correct patient, correct procedure, correct site.  General anesthesia was administered patient was placed in dorsal lithotomy position.  Her genitalia was then prepped and draped in usual sterile fashion.  A rigid 40 French cystoscope was passed in the urethra and the bladder.  Bladder was inspected free masses or lesions.  the right ureteral orifices were in the normal orthotopic locations.  a 6 french ureteral catheter was then instilled into the right ureter orifice.  a gentle retrograde was obtained and findings noted above.  we then placed a zip wire through the ureteral catheter and advanced up to the renal pelvis.  we then removed the cystoscope and cannulated the right ureteral orifice with a semirigid ureteroscope.  we then encountered the stone in the distal ureter.  using using a 200 nm laser fiber and fragmented the stone into smaller pieces.  the pieces were then removed with a Ngage basket.  once all stone fragments were removed we then placed a 6 x 26 double-j ureteral stent over the  original zip wire. We then removed the wire and good coil was noted in the the renal pelvis under fluoroscopy and the bladder under direct vision.     the stone fragments were then removed from the bladder and sent for analysis.   the bladder was then drained and this concluded the procedure which was well tolerated by patient.  Complications: None  Condition: Stable, extubated, transferred to PACU  Plan: Patient is to be discharged home as to follow-up in 2 weeks for stent removal.

## 2015-08-02 NOTE — Transfer of Care (Signed)
Immediate Anesthesia Transfer of Care Note  Patient: [name] [name]  Procedure(s) Performed: Procedure(s): CYSTOSCOPY WITH RETROGRADE PYELOGRAM, URETEROSCOPY , STONE EXTRACTION AND STENT PLACEMENT (Right) HOLMIUM LASER APPLICATION (Right)  Patient Location: PACU  Anesthesia Type:General  Level of Consciousness:  sedated, patient cooperative and responds to stimulation  Airway & Oxygen Therapy:Patient Spontanous Breathing and Patient connected to face mask oxgen  Post-op Assessment:  Report given to PACU RN and Post -op Vital signs reviewed and stable  Post vital signs:  Reviewed and stable  Last Vitals:  Filed Vitals:   08/02/15 1348  BP: 115/44  Pulse:   Temp:   Resp:     Complications: No apparent anesthesia complications

## 2015-08-02 NOTE — Anesthesia Postprocedure Evaluation (Signed)
  Anesthesia Post-op Note  Patient: [name] [name]  Procedure(s) Performed: Procedure(s) (LRB): CYSTOSCOPY WITH RETROGRADE PYELOGRAM, URETEROSCOPY , STONE EXTRACTION AND STENT PLACEMENT (Right) HOLMIUM LASER APPLICATION (Right)  Patient Location: PACU  Anesthesia Type: General  Level of Consciousness: awake and alert   Airway and Oxygen Therapy: Patient Spontanous Breathing  Post-op Pain: mild  Post-op Assessment: Post-op Vital signs reviewed, Patient's Cardiovascular Status Stable, Respiratory Function Stable, Patent Airway and No signs of Nausea or vomiting  Last Vitals:  Filed Vitals:   08/02/15 1400  BP: 122/64  Pulse: 68  Temp:   Resp: 19    Post-op Vital Signs: stable   Complications: No apparent anesthesia complications

## 2015-08-03 DIAGNOSIS — I10 Essential (primary) hypertension: Secondary | ICD-10-CM | POA: Diagnosis not present

## 2015-08-03 DIAGNOSIS — E119 Type 2 diabetes mellitus without complications: Secondary | ICD-10-CM | POA: Diagnosis not present

## 2015-08-05 DIAGNOSIS — N201 Calculus of ureter: Secondary | ICD-10-CM | POA: Diagnosis not present

## 2015-08-16 DIAGNOSIS — N201 Calculus of ureter: Secondary | ICD-10-CM | POA: Diagnosis not present

## 2015-09-02 DIAGNOSIS — E119 Type 2 diabetes mellitus without complications: Secondary | ICD-10-CM | POA: Diagnosis not present

## 2015-09-02 DIAGNOSIS — I10 Essential (primary) hypertension: Secondary | ICD-10-CM | POA: Diagnosis not present

## 2015-09-08 DIAGNOSIS — L03116 Cellulitis of left lower limb: Secondary | ICD-10-CM | POA: Diagnosis not present

## 2015-09-21 DIAGNOSIS — R3129 Other microscopic hematuria: Secondary | ICD-10-CM | POA: Diagnosis not present

## 2015-09-21 DIAGNOSIS — N200 Calculus of kidney: Secondary | ICD-10-CM | POA: Diagnosis not present

## 2015-09-21 DIAGNOSIS — R350 Frequency of micturition: Secondary | ICD-10-CM | POA: Diagnosis not present

## 2015-09-24 ENCOUNTER — Encounter: Payer: Self-pay | Admitting: *Deleted

## 2015-09-24 ENCOUNTER — Ambulatory Visit (INDEPENDENT_AMBULATORY_CARE_PROVIDER_SITE_OTHER): Payer: Medicare Other | Admitting: Cardiology

## 2015-09-24 ENCOUNTER — Encounter: Payer: Self-pay | Admitting: Cardiology

## 2015-09-24 ENCOUNTER — Other Ambulatory Visit: Payer: Self-pay | Admitting: *Deleted

## 2015-09-24 VITALS — BP 170/92 | HR 53 | Ht 70.0 in | Wt 357.0 lb

## 2015-09-24 DIAGNOSIS — I10 Essential (primary) hypertension: Secondary | ICD-10-CM | POA: Diagnosis not present

## 2015-09-24 DIAGNOSIS — I5032 Chronic diastolic (congestive) heart failure: Secondary | ICD-10-CM | POA: Diagnosis not present

## 2015-09-24 MED ORDER — HYDRALAZINE HCL 25 MG PO TABS: 25.0000 mg | ORAL_TABLET | Freq: Two times a day (BID) | ORAL | Status: DC

## 2015-09-24 NOTE — Patient Instructions (Signed)
Your physician has recommended you make the following change in your medication:  Increase hydralazine 25 mg to twice daily. Continue all other medications the same. Your physician recommends that you schedule a follow-up appointment in: 6 months. You will receive a reminder letter in the mail in about 4 months reminding you to call and schedule your appointment. If you don't receive this letter, please contact our office. Please reduce your salt intake.

## 2015-09-24 NOTE — Progress Notes (Signed)
Cardiology Office Note  Date: 09/24/2015   ID: Crispin, Coughran 1955-11-08, MRN [id number]  PCP: Manon Hilding, MD  Primary Cardiologist: Rozann Lesches, MD   Chief Complaint  Patient presents with  . Diastolic heart failure    History of Present Illness: [name] [name] is a 60 y.o. male last seen in July 2016. He presents for a routine follow-up visit with his wife.  He reports interval problems with a "blood infection" treated at [facility] and also more recently kidney stones. Record review finds cystoscopy an ureteroscopy with stone extraction and stent placement back in November 2016.  He continues with chronic leg edema, also remains morbidly obese. He admits to dietary indiscretions including increased sodium intake. His weight is up only 2 pounds from  November.  Today we discussed sodium restriction guidelines, also reviewed his medications. He has only been taking hydralazine once a day.  Past Medical History  Diagnosis Date  . TIA (transient ischemic attack)   . History of stroke July 2004  . History of nephrolithiasis   . Essential hypertension   . 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 Varicose veins     Laser ablation of left great saphenous vein 2009  . History of open leg wound   . Obstructive sleep apnea     Current Outpatient Prescriptions  Medication Sig Dispense Refill  . acetaminophen (TYLENOL) 500 MG tablet Take 1,000 mg by mouth daily as needed (pain).    Marland Kitchen allopurinol (ZYLOPRIM) 300 MG tablet Take 300 mg by mouth daily.    . Ascorbic Acid (VITAMIN C) 1000 MG tablet Take 6,000 mg by mouth daily as needed (when a cold is coming on.).    Marland Kitchen aspirin EC 325 MG tablet Take 325 mg by mouth daily.    Marland Kitchen atorvastatin (LIPITOR) 10 MG tablet Take 10 mg by mouth daily.    . clopidogrel (PLAVIX) 75 MG tablet Take 75 mg by mouth daily.    . furosemide (LASIX) 40 MG tablet Take 40 mg by mouth 2 (two) times daily.     . hydrALAZINE (APRESOLINE) 25 MG tablet Take 25 mg  by mouth daily.     Marland Kitchen ibuprofen (ADVIL,MOTRIN) 200 MG tablet Take 400-600 mg by mouth daily as needed (pain).     . metoprolol succinate (TOPROL-XL) 100 MG 24 hr tablet Take 100 mg by mouth daily. Take with or immediately following a meal.    . potassium chloride SA (K-DUR,KLOR-CON) 20 MEQ tablet Take 20 mEq by mouth 2 (two) times daily.    . valsartan-hydrochlorothiazide (DIOVAN-HCT) 320-25 MG per tablet Take 1 tablet by mouth daily.     No current facility-administered medications for this visit.   Allergies:  Review of patient's allergies indicates no known allergies.   Social History: The patient  reports that he has never smoked. He has never used smokeless tobacco. He reports that he does not drink alcohol or use illicit drugs.   ROS:  Please see the history of present illness. Otherwise, complete review of systems is positive for chronic back pain.  All other systems are reviewed and negative.   Physical Exam: VS:  BP 170/92 mmHg  Pulse 53  Ht 5\' 10"  (1.778 m)  Wt 357 lb (161.934 kg)  BMI 51.22 kg/m2  SpO2 94%, BMI Body mass index is 51.22 kg/(m^2).  Wt Readings from Last 3 Encounters:  09/24/15 357 lb (161.934 kg)  08/02/15 355 lb (161.027 kg)  07/28/15 355 lb (161.027 kg)  Morbidly obese male, no distress. HEENT: Conjunctiva and lids normal, oropharynx clear. Neck: Supple, increased girth without obvious elevated JVP or carotid bruits, no thyromegaly. Lungs: Diminished breath sounds, nonlabored breathing at rest. Cardiac: Regular rate and rhythm, no S3, soft systolic murmur, no pericardial rub. Abdomen: Soft, obese and protuberant, bowel sounds present, no guarding or rebound. Extremities: Bilateral, chronic appearing stasis and edema of the legs, distal pulses 1-2+.  ECG:  Tracing from 10/03/2014 showed sinus rhythm with LVH , prolonged PR interval, nonspecific ST-T changes.  Recent Labwork: 07/28/2015: BUN 21*; Creatinine, Ser 0.92; Hemoglobin 13.1; Platelets 142*;  Potassium 3.8; Sodium 140   Other Studies Reviewed Today:  Echocardiogram 09/25/2014 [facility]) reported moderate LVH with LVEF 60-65%, abnormal diastolic function, no major valvular abnormalities.  Assessment and Plan:  1. Chronic diastolic heart failure. We discussed sodium restriction guidelines today. He will continue on Lasix and monitor weight.  2. Essential hypertension, blood pressure not optimally controlled. Hydralazine will be taken twice daily and can be uptitrated as tolerated.  Also limit sodium in the diet.  Current medicines were reviewed with the patient today.  Disposition: FU with me in 6 months.   Signed, Satira Sark, MD, [facility] 09/24/2015 2:01 PM    [facility] [place], [place], [place] [address] Phone: [phone]; Fax: [phone]

## 2015-11-16 DIAGNOSIS — E7800 Pure hypercholesterolemia, unspecified: Secondary | ICD-10-CM | POA: Diagnosis not present

## 2015-11-16 DIAGNOSIS — E1129 Type 2 diabetes mellitus with other diabetic kidney complication: Secondary | ICD-10-CM | POA: Diagnosis not present

## 2015-11-16 DIAGNOSIS — I10 Essential (primary) hypertension: Secondary | ICD-10-CM | POA: Diagnosis not present

## 2015-11-16 DIAGNOSIS — I5032 Chronic diastolic (congestive) heart failure: Secondary | ICD-10-CM | POA: Diagnosis not present

## 2015-11-18 DIAGNOSIS — E1129 Type 2 diabetes mellitus with other diabetic kidney complication: Secondary | ICD-10-CM | POA: Diagnosis not present

## 2015-11-18 DIAGNOSIS — E1121 Type 2 diabetes mellitus with diabetic nephropathy: Secondary | ICD-10-CM | POA: Diagnosis not present

## 2015-11-18 DIAGNOSIS — Z13220 Encounter for screening for lipoid disorders: Secondary | ICD-10-CM | POA: Diagnosis not present

## 2015-11-18 DIAGNOSIS — Z1389 Encounter for screening for other disorder: Secondary | ICD-10-CM | POA: Diagnosis not present

## 2015-11-18 DIAGNOSIS — I10 Essential (primary) hypertension: Secondary | ICD-10-CM | POA: Diagnosis not present

## 2016-03-20 DIAGNOSIS — E7800 Pure hypercholesterolemia, unspecified: Secondary | ICD-10-CM | POA: Diagnosis not present

## 2016-03-20 DIAGNOSIS — I10 Essential (primary) hypertension: Secondary | ICD-10-CM | POA: Diagnosis not present

## 2016-03-20 DIAGNOSIS — E119 Type 2 diabetes mellitus without complications: Secondary | ICD-10-CM | POA: Diagnosis not present

## 2016-03-22 ENCOUNTER — Encounter: Payer: Self-pay | Admitting: *Deleted

## 2016-03-23 ENCOUNTER — Ambulatory Visit: Payer: Medicare Other | Admitting: Cardiology

## 2016-03-24 DIAGNOSIS — E1121 Type 2 diabetes mellitus with diabetic nephropathy: Secondary | ICD-10-CM | POA: Diagnosis not present

## 2016-03-24 DIAGNOSIS — Z6841 Body Mass Index (BMI) 40.0 and over, adult: Secondary | ICD-10-CM | POA: Diagnosis not present

## 2016-03-24 DIAGNOSIS — I10 Essential (primary) hypertension: Secondary | ICD-10-CM | POA: Diagnosis not present

## 2016-03-24 DIAGNOSIS — E1129 Type 2 diabetes mellitus with other diabetic kidney complication: Secondary | ICD-10-CM | POA: Diagnosis not present

## 2016-03-24 DIAGNOSIS — E7800 Pure hypercholesterolemia, unspecified: Secondary | ICD-10-CM | POA: Diagnosis not present

## 2016-03-31 DIAGNOSIS — N200 Calculus of kidney: Secondary | ICD-10-CM | POA: Diagnosis not present

## 2016-03-31 DIAGNOSIS — R3915 Urgency of urination: Secondary | ICD-10-CM | POA: Diagnosis not present

## 2016-04-13 NOTE — Progress Notes (Signed)
Cardiology Office Note  Date: 04/14/2016   ID: [name], [name] [birth year], MRN [id number]  PCP: Manon Hilding, MD  Primary Cardiologist: Rozann Lesches, MD   Chief Complaint  Patient presents with  . Diastolic heart failure    History of Present Illness: [name] [name] is a 60 y.o. male last seen in January. He presents for a routine follow-up visit. Reports no overall major change in stamina, with chronic dyspnea on exertion and leg edema. He remains morbidly obese with venous stasis, has wraps on his lower legs. We talked about benefit of diet and weight loss.  I reviewed his medications which are outlined below. He has been taking hydralazine 25 mg the morning and 50 mg in the evening. We discussed increasing this to 50 mg twice a day, and uptitrate from there if needed. I did review his recent lab work which is outlined below. ECG today shows sinus rhythm with borderline low voltage and nonspecific T-wave changes.  Past Medical History:  Diagnosis Date  . 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 Essential hypertension   . History of nephrolithiasis   . History of open leg wound   . History of stroke July 2004  . Obstructive sleep apnea   . TIA (transient ischemic attack)   . Varicose veins    Laser ablation of left great saphenous vein 2009    Current Outpatient Prescriptions  Medication Sig Dispense Refill  . acetaminophen (TYLENOL) 500 MG tablet Take 1,000 mg by mouth daily as needed (pain).    Marland Kitchen allopurinol (ZYLOPRIM) 300 MG tablet Take 300 mg by mouth daily.    . Ascorbic Acid (VITAMIN C) 1000 MG tablet Take 6,000 mg by mouth daily as needed (when a cold is coming on.).    Marland Kitchen aspirin EC 325 MG tablet Take 325 mg by mouth daily.    Marland Kitchen atorvastatin (LIPITOR) 10 MG tablet Take 10 mg by mouth daily.    . clopidogrel (PLAVIX) 75 MG tablet Take 75 mg by mouth daily.    . furosemide (LASIX) 40 MG tablet Take 40 mg by mouth 2 (two) times daily.     Marland Kitchen ibuprofen (ADVIL,MOTRIN) 200 MG  tablet Take 400-600 mg by mouth daily as needed (pain).     . metoprolol succinate (TOPROL-XL) 100 MG 24 hr tablet Take 100 mg by mouth daily. Take with or immediately following a meal.    . potassium chloride SA (K-DUR,KLOR-CON) 20 MEQ tablet Take 20 mEq by mouth 2 (two) times daily.    . valsartan-hydrochlorothiazide (DIOVAN-HCT) 320-25 MG per tablet Take 1 tablet by mouth daily.    . hydrALAZINE (APRESOLINE) 50 MG tablet Take 1 tablet (50 mg total) by mouth 2 (two) times daily. 180 tablet 3   No current facility-administered medications for this visit.    Allergies:  Review of patient's allergies indicates no known allergies.   Social History: The patient  reports that he has never smoked. He has never used smokeless tobacco. He reports that he does not drink alcohol or use drugs.   ROS:  Please see the history of present illness. Otherwise, complete review of systems is positive for chronic knee pain.  All other systems are reviewed and negative.   Physical Exam: VS:  BP (!) 162/100   Pulse 61   Ht 5\' 10"  (1.778 m)   Wt (!) 368 lb (166.9 kg)   SpO2 92%   BMI 52.80 kg/m , BMI Body mass index is 52.8 kg/m.  Wt  Readings from Last 3 Encounters:  04/14/16 (!) 368 lb (166.9 kg)  09/24/15 (!) 357 lb (161.9 kg)  08/02/15 (!) 355 lb (161 kg)    Morbidly obese male, no distress. HEENT: Conjunctiva and lids normal, oropharynx clear. Neck: Supple, increased girth without obvious elevated JVP or carotid bruits, no thyromegaly. Lungs: Diminished breath sounds, nonlabored breathing at rest. Cardiac: Regular rate and rhythm, no S3, soft systolic murmur, no pericardial rub. Abdomen: Soft, obese and protuberant, bowel sounds present, no guarding or rebound. Extremities: Bilateral, chronic appearing stasis and edema of the legs, distal pulses 1-2+.  ECG: I personally reviewed the tracing from 06/30/2015 which showed sinus rhythm with prolonged PR interval, increased voltage, repolarization  abnormalities.  Recent Labwork: 07/28/2015: BUN 21; Creatinine, Ser 0.92; Hemoglobin 13.1; Platelets 142; Potassium 3.8; Sodium 140  July 2017: BUN 24, creatinine 1.1, potassium 4.2, AST 23, ALT 25, cholesterol 119, triglycerides 141, HDL 32, LDL 59, hemoglobin A1c 6.9  Other Studies Reviewed Today:  Echocardiogram 09/25/2014 [facility]): Moderate LVH with LVEF 60-65%, abnormal diastolic function, no major valvular abnormalities.  Assessment and Plan:  1. Chronic diastolic heart failure. Leg edema is multifactorial with associated venous stasis and morbid obesity. We discussed benefit of diet and weight loss, we'll continue diuretic therapy as well. Recent renal function stable with creatinine 1.1 on present dose of Lasix.  2. Essential hypertension, blood pressure not optimally controlled. Complicated by obesity and obstructive sleep apnea. Increasing hydralazine to 50 mg twice daily.  Current medicines were reviewed with the patient today.   Orders Placed This Encounter  Procedures  . EKG 12-Lead    Disposition: Follow-up with me in 6 months.  Signed, Satira Sark, MD, [facility] 04/14/2016 4:20 PM    [facility] [place], [place], [place] [address] Phone: [phone]; Fax: [phone]

## 2016-04-14 ENCOUNTER — Ambulatory Visit (INDEPENDENT_AMBULATORY_CARE_PROVIDER_SITE_OTHER): Payer: Medicare Other | Admitting: Cardiology

## 2016-04-14 ENCOUNTER — Encounter: Payer: Self-pay | Admitting: Cardiology

## 2016-04-14 VITALS — BP 162/100 | HR 61 | Ht 70.0 in | Wt 368.0 lb

## 2016-04-14 DIAGNOSIS — I5032 Chronic diastolic (congestive) heart failure: Secondary | ICD-10-CM

## 2016-04-14 DIAGNOSIS — I10 Essential (primary) hypertension: Secondary | ICD-10-CM

## 2016-04-14 MED ORDER — HYDRALAZINE HCL 50 MG PO TABS: 50.0000 mg | ORAL_TABLET | Freq: Two times a day (BID) | ORAL | 3 refills | Status: DC

## 2016-04-14 NOTE — Patient Instructions (Addendum)
Medication Instructions:   Your physician has recommended you make the following change in your medication:   Increase hydralazine to 50 mg twice daily. You may take (2) of your 25 mg twice daily until they are finished.  Continue all other medications the same.  Labwork: NONE  Testing/Procedures: NONE  Follow-Up:  Your physician recommends that you schedule a follow-up appointment in: 6 months. You will receive a reminder letter in the mail in about 4 months reminding you to call and schedule your appointment. If you don't receive this letter, please contact our office.  Any Other Special Instructions Will Be Listed Below (If Applicable).  If you need a refill on your cardiac medications before your next appointment, please call your pharmacy.

## 2016-04-25 ENCOUNTER — Encounter (HOSPITAL_BASED_OUTPATIENT_CLINIC_OR_DEPARTMENT_OTHER): Payer: Medicare Other | Attending: Surgery

## 2016-04-25 DIAGNOSIS — L97811 Non-pressure chronic ulcer of other part of right lower leg limited to breakdown of skin: Secondary | ICD-10-CM | POA: Insufficient documentation

## 2016-04-25 DIAGNOSIS — Z6841 Body Mass Index (BMI) 40.0 and over, adult: Secondary | ICD-10-CM | POA: Insufficient documentation

## 2016-04-25 DIAGNOSIS — L97821 Non-pressure chronic ulcer of other part of left lower leg limited to breakdown of skin: Secondary | ICD-10-CM | POA: Insufficient documentation

## 2016-04-25 DIAGNOSIS — I10 Essential (primary) hypertension: Secondary | ICD-10-CM | POA: Diagnosis not present

## 2016-04-25 DIAGNOSIS — G4730 Sleep apnea, unspecified: Secondary | ICD-10-CM | POA: Insufficient documentation

## 2016-04-25 DIAGNOSIS — I87333 Chronic venous hypertension (idiopathic) with ulcer and inflammation of bilateral lower extremity: Secondary | ICD-10-CM | POA: Insufficient documentation

## 2016-04-25 DIAGNOSIS — I890 Lymphedema, not elsewhere classified: Secondary | ICD-10-CM | POA: Insufficient documentation

## 2016-04-25 DIAGNOSIS — I87313 Chronic venous hypertension (idiopathic) with ulcer of bilateral lower extremity: Secondary | ICD-10-CM | POA: Diagnosis not present

## 2016-05-01 ENCOUNTER — Ambulatory Visit (HOSPITAL_COMMUNITY)
Admission: RE | Admit: 2016-05-01 | Discharge: 2016-05-01 | Disposition: A | Discharge status: Home / Self Care | Payer: Medicare Other | Source: Ambulatory Visit | Attending: Surgery | Admitting: Surgery

## 2016-05-01 ENCOUNTER — Other Ambulatory Visit (HOSPITAL_BASED_OUTPATIENT_CLINIC_OR_DEPARTMENT_OTHER): Payer: Self-pay | Admitting: General Surgery

## 2016-05-01 DIAGNOSIS — L97919 Non-pressure chronic ulcer of unspecified part of right lower leg with unspecified severity: Secondary | ICD-10-CM

## 2016-05-01 DIAGNOSIS — I10 Essential (primary) hypertension: Secondary | ICD-10-CM | POA: Insufficient documentation

## 2016-05-01 DIAGNOSIS — L97929 Non-pressure chronic ulcer of unspecified part of left lower leg with unspecified severity: Secondary | ICD-10-CM | POA: Diagnosis not present

## 2016-05-02 DIAGNOSIS — I10 Essential (primary) hypertension: Secondary | ICD-10-CM | POA: Diagnosis not present

## 2016-05-02 DIAGNOSIS — G4730 Sleep apnea, unspecified: Secondary | ICD-10-CM | POA: Diagnosis not present

## 2016-05-02 DIAGNOSIS — L97821 Non-pressure chronic ulcer of other part of left lower leg limited to breakdown of skin: Secondary | ICD-10-CM | POA: Diagnosis not present

## 2016-05-02 DIAGNOSIS — I87333 Chronic venous hypertension (idiopathic) with ulcer and inflammation of bilateral lower extremity: Secondary | ICD-10-CM | POA: Diagnosis not present

## 2016-05-02 DIAGNOSIS — L97811 Non-pressure chronic ulcer of other part of right lower leg limited to breakdown of skin: Secondary | ICD-10-CM | POA: Diagnosis not present

## 2016-05-02 DIAGNOSIS — I890 Lymphedema, not elsewhere classified: Secondary | ICD-10-CM | POA: Diagnosis not present

## 2016-05-02 DIAGNOSIS — I87313 Chronic venous hypertension (idiopathic) with ulcer of bilateral lower extremity: Secondary | ICD-10-CM | POA: Diagnosis not present

## 2016-05-08 ENCOUNTER — Encounter (HOSPITAL_COMMUNITY): Payer: Medicare Other

## 2016-05-09 ENCOUNTER — Other Ambulatory Visit (HOSPITAL_BASED_OUTPATIENT_CLINIC_OR_DEPARTMENT_OTHER): Payer: Self-pay | Admitting: General Surgery

## 2016-05-09 ENCOUNTER — Ambulatory Visit (HOSPITAL_COMMUNITY)
Admission: RE | Admit: 2016-05-09 | Discharge: 2016-05-09 | Disposition: A | Discharge status: Home / Self Care | Payer: Medicare Other | Source: Ambulatory Visit | Attending: Vascular Surgery | Admitting: Vascular Surgery

## 2016-05-09 DIAGNOSIS — R600 Localized edema: Secondary | ICD-10-CM | POA: Diagnosis not present

## 2016-05-09 DIAGNOSIS — L97909 Non-pressure chronic ulcer of unspecified part of unspecified lower leg with unspecified severity: Secondary | ICD-10-CM

## 2016-05-09 DIAGNOSIS — I890 Lymphedema, not elsewhere classified: Secondary | ICD-10-CM | POA: Diagnosis not present

## 2016-05-09 DIAGNOSIS — I8392 Asymptomatic varicose veins of left lower extremity: Secondary | ICD-10-CM | POA: Insufficient documentation

## 2016-05-09 DIAGNOSIS — I10 Essential (primary) hypertension: Secondary | ICD-10-CM | POA: Diagnosis not present

## 2016-05-09 DIAGNOSIS — G4733 Obstructive sleep apnea (adult) (pediatric): Secondary | ICD-10-CM | POA: Diagnosis not present

## 2016-05-09 DIAGNOSIS — L97821 Non-pressure chronic ulcer of other part of left lower leg limited to breakdown of skin: Secondary | ICD-10-CM | POA: Diagnosis not present

## 2016-05-09 DIAGNOSIS — G4730 Sleep apnea, unspecified: Secondary | ICD-10-CM | POA: Diagnosis not present

## 2016-05-09 DIAGNOSIS — L97811 Non-pressure chronic ulcer of other part of right lower leg limited to breakdown of skin: Secondary | ICD-10-CM | POA: Diagnosis not present

## 2016-05-09 DIAGNOSIS — I87333 Chronic venous hypertension (idiopathic) with ulcer and inflammation of bilateral lower extremity: Secondary | ICD-10-CM | POA: Diagnosis not present

## 2016-05-16 ENCOUNTER — Telehealth: Payer: Self-pay

## 2016-05-16 DIAGNOSIS — I10 Essential (primary) hypertension: Secondary | ICD-10-CM | POA: Diagnosis not present

## 2016-05-16 DIAGNOSIS — G4730 Sleep apnea, unspecified: Secondary | ICD-10-CM | POA: Diagnosis not present

## 2016-05-16 DIAGNOSIS — I890 Lymphedema, not elsewhere classified: Secondary | ICD-10-CM | POA: Diagnosis not present

## 2016-05-16 DIAGNOSIS — I87312 Chronic venous hypertension (idiopathic) with ulcer of left lower extremity: Secondary | ICD-10-CM | POA: Diagnosis not present

## 2016-05-16 DIAGNOSIS — L97821 Non-pressure chronic ulcer of other part of left lower leg limited to breakdown of skin: Secondary | ICD-10-CM | POA: Diagnosis not present

## 2016-05-16 DIAGNOSIS — I87333 Chronic venous hypertension (idiopathic) with ulcer and inflammation of bilateral lower extremity: Secondary | ICD-10-CM | POA: Diagnosis not present

## 2016-05-16 DIAGNOSIS — L97811 Non-pressure chronic ulcer of other part of right lower leg limited to breakdown of skin: Secondary | ICD-10-CM | POA: Diagnosis not present

## 2016-05-16 NOTE — Telephone Encounter (Addendum)
rec'd phone call from [facility] with referral from Dr. Con Memos to evaluate bilateral lower extremity ulcers ASAP.  Reported that the pt. had vascular studies done in our office on 8/14 and 8/22, and is requesting that pt. see a provider.  Advised that this will be scheduled and pt. notified.  Requested Langley Gauss from the [facility] fax recent office note and order for referral to our office.  Agreed.

## 2016-05-23 ENCOUNTER — Encounter (HOSPITAL_BASED_OUTPATIENT_CLINIC_OR_DEPARTMENT_OTHER): Payer: Medicare Other | Attending: Surgery

## 2016-05-23 DIAGNOSIS — G4730 Sleep apnea, unspecified: Secondary | ICD-10-CM | POA: Insufficient documentation

## 2016-05-23 DIAGNOSIS — L97221 Non-pressure chronic ulcer of left calf limited to breakdown of skin: Secondary | ICD-10-CM | POA: Insufficient documentation

## 2016-05-23 DIAGNOSIS — I10 Essential (primary) hypertension: Secondary | ICD-10-CM | POA: Diagnosis not present

## 2016-05-23 DIAGNOSIS — Z8673 Personal history of transient ischemic attack (TIA), and cerebral infarction without residual deficits: Secondary | ICD-10-CM | POA: Insufficient documentation

## 2016-05-23 DIAGNOSIS — L97811 Non-pressure chronic ulcer of other part of right lower leg limited to breakdown of skin: Secondary | ICD-10-CM | POA: Insufficient documentation

## 2016-05-23 DIAGNOSIS — I87333 Chronic venous hypertension (idiopathic) with ulcer and inflammation of bilateral lower extremity: Secondary | ICD-10-CM | POA: Diagnosis not present

## 2016-05-23 DIAGNOSIS — L97821 Non-pressure chronic ulcer of other part of left lower leg limited to breakdown of skin: Secondary | ICD-10-CM | POA: Diagnosis not present

## 2016-05-23 DIAGNOSIS — I890 Lymphedema, not elsewhere classified: Secondary | ICD-10-CM | POA: Diagnosis not present

## 2016-05-23 DIAGNOSIS — I87313 Chronic venous hypertension (idiopathic) with ulcer of bilateral lower extremity: Secondary | ICD-10-CM | POA: Diagnosis not present

## 2016-05-24 ENCOUNTER — Encounter: Payer: Self-pay | Admitting: Vascular Surgery

## 2016-05-25 ENCOUNTER — Ambulatory Visit (INDEPENDENT_AMBULATORY_CARE_PROVIDER_SITE_OTHER): Payer: Medicare Other | Admitting: Vascular Surgery

## 2016-05-25 ENCOUNTER — Encounter: Payer: Self-pay | Admitting: Vascular Surgery

## 2016-05-25 VITALS — BP 121/69 | HR 56 | Temp 97.4°F | Resp 20 | Ht 70.0 in | Wt 371.6 lb

## 2016-05-25 DIAGNOSIS — I83212 Varicose veins of right lower extremity with both ulcer of calf and inflammation: Secondary | ICD-10-CM

## 2016-05-25 DIAGNOSIS — I83222 Varicose veins of left lower extremity with both ulcer of calf and inflammation: Secondary | ICD-10-CM

## 2016-05-25 DIAGNOSIS — L97229 Non-pressure chronic ulcer of left calf with unspecified severity: Secondary | ICD-10-CM

## 2016-05-25 DIAGNOSIS — L97219 Non-pressure chronic ulcer of right calf with unspecified severity: Principal | ICD-10-CM

## 2016-05-25 NOTE — Progress Notes (Signed)
Patient name: [name] [name] MRN: [id number] DOB: [birth year] Sex: male  REASON FOR VISIT: Relation of bilateral lower extremity venous hypertension with bilateral lower extremity venous ulcers.  HPI: [name] [name] is a 60 y.o. male well-known to me from prior laser ablation of his left great saphenous vein in 2009. He has a very unusual resignation at that time that he had had 2 prior ablations of this same vein with the interventional radiology. These had failed and he underwent ablation by myself with laser in 2009. He does have a severe venous hypertension bilaterally. He is morbidly obese. He is compliant with compression but despite this has had recurrent ulceration in both legs left greater than right for several months and is been continuing with compression and is actually been seen by the wound center for approximately 6 weeks. He has had the topical treatment and compression dressing with slow improvement bilaterally.  Past Medical History:  Diagnosis Date  . 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 Essential hypertension   . History of nephrolithiasis   . History of open leg wound   . History of stroke July 2004  . Obstructive sleep apnea   . TIA (transient ischemic attack)   . Varicose veins    Laser ablation of left great saphenous vein 2009    Family History  Problem Relation Age of Onset  . Bone cancer Mother   . Hypertension Father   . Pancreatic cancer Maternal Grandmother   . Stroke Paternal Grandmother   . Diabetes Paternal Grandfather   . Heart attack Brother   . Hypertension Brother   . Heart attack Sister     SOCIAL HISTORY: Social History  Substance Use Topics  . Smoking status: Never Smoker  . Smokeless tobacco: Never Used  . Alcohol use No    No Known Allergies  Current Outpatient Prescriptions  Medication Sig Dispense Refill  . acetaminophen (TYLENOL) 500 MG tablet Take 1,000 mg by mouth daily as needed (pain).    Marland Kitchen allopurinol (ZYLOPRIM) 300 MG tablet Take  300 mg by mouth daily.    . Ascorbic Acid (VITAMIN C) 1000 MG tablet Take 6,000 mg by mouth daily as needed (when a cold is coming on.).    Marland Kitchen aspirin EC 325 MG tablet Take 325 mg by mouth daily.    Marland Kitchen atorvastatin (LIPITOR) 10 MG tablet Take 10 mg by mouth daily.    . clopidogrel (PLAVIX) 75 MG tablet Take 75 mg by mouth daily.    Marland Kitchen doxycycline (VIBRAMYCIN) 100 MG capsule Take 100 mg by mouth 2 (two) times daily.    . furosemide (LASIX) 40 MG tablet Take 40 mg by mouth 2 (two) times daily.     . hydrALAZINE (APRESOLINE) 50 MG tablet Take 1 tablet (50 mg total) by mouth 2 (two) times daily. 180 tablet 3  . ibuprofen (ADVIL,MOTRIN) 200 MG tablet Take 400-600 mg by mouth daily as needed (pain).     . metoprolol succinate (TOPROL-XL) 100 MG 24 hr tablet Take 100 mg by mouth daily. Take with or immediately following a meal.    . valsartan-hydrochlorothiazide (DIOVAN-HCT) 320-25 MG per tablet Take 1 tablet by mouth daily.    . potassium chloride SA (K-DUR,KLOR-CON) 20 MEQ tablet Take 20 mEq by mouth 2 (two) times daily.     No current facility-administered medications for this visit.     REVIEW OF SYSTEMS:  [X]  denotes positive finding, [ ]  denotes negative finding Cardiac  Comments:  Chest pain or  chest pressure:    Shortness of breath upon exertion:    Short of breath when lying flat:    Irregular heart rhythm:        Vascular    Pain in calf, thigh, or hip brought on by ambulation:    Pain in feet at night that wakes you up from your sleep:     Blood clot in your veins:    Leg swelling:   x       Pulmonary    Oxygen at home:    Productive cough:     Wheezing:         Neurologic    Sudden weakness in arms or legs:     Sudden numbness in arms or legs:     Sudden onset of difficulty speaking or slurred speech:    Temporary loss of vision in one eye:     Problems with dizziness:         Gastrointestinal    Blood in stool:     Vomited blood:         Genitourinary    Burning when  urinating:     Blood in urine:        Psychiatric    Major depression:         Hematologic    Bleeding problems:    Problems with blood clotting too easily:        Skin    Rashes or ulcers: x       Constitutional    Fever or chills:      PHYSICAL EXAM: Vitals:   05/25/16 0916  BP: 121/69  Pulse: (!) 56  Resp: 20  Temp: 97.4 F (36.3 C)  TempSrc: Oral  SpO2: 92%  Weight: (!) 371 lb 9.6 oz (168.6 kg)  Height: 5\' 10"  (1.778 m)    GENERAL: The patient is a well-nourished male, in no acute distress. The vital signs are documented above. VASCULAR: 2+ radial pulses. Compression wraps on his lower extremities therefore not assessed PULMONARY: There is good air exchange  .  MUSCULOSKELETAL: There are no major deformities or cyanosis. NEUROLOGIC: No focal weakness or paresthesias are detected. SKIN: Sensitive changes of venous hypertension from his knees distally with thickening and hemosiderin deposits. PSYCHIATRIC: The patient has a normal affect.  DATA:   Venous duplex revealed apparent recanalization of his left great saphenous vein with reflux throughout its course. His right great saphenous vein is somewhat dilated but no evidence of reflux. Interestingly the only deep venous reflux demonstrated was in his left common femoral vein.  MEDICAL ISSUES:  Had a very long discussion with the patient. Apparently has had 3 different episodes of failure of ablation of his left great saphenous vein. He does have severe venous stasis ulceration from venous hypertension and it appears that the majority this is related to his soup surface veins. I did reimage his veins with SonoSite ultrasound myself. This does show a markedly dilated saphenous vein throughout his left thigh. I explained options for treatment of this would be yet another attempt at closure with laser ablation. Also explain the option of outpatient surgery for removal of his great saphenous vein in the operating room with  the standard vein stripping technique. I feel that this probably would be the most desirable treatment since he has had unexplained recurrent failure of ablation attempts. He wishes to consider this. Fortunately he is having optimal treatment at the wound center for his venous stasis ulceration. He will  notify us if he wishes to proceed with treatment and if he would prefer reattempted ablation versus removal of his left great saphenous vein in the operating room as an outpatient    [name] [name] [facility] [employer] [facility] [phone]

## 2016-05-26 DIAGNOSIS — I87333 Chronic venous hypertension (idiopathic) with ulcer and inflammation of bilateral lower extremity: Secondary | ICD-10-CM | POA: Diagnosis not present

## 2016-05-26 DIAGNOSIS — L97821 Non-pressure chronic ulcer of other part of left lower leg limited to breakdown of skin: Secondary | ICD-10-CM | POA: Diagnosis not present

## 2016-05-26 DIAGNOSIS — I509 Heart failure, unspecified: Secondary | ICD-10-CM | POA: Diagnosis not present

## 2016-05-26 DIAGNOSIS — G4730 Sleep apnea, unspecified: Secondary | ICD-10-CM | POA: Diagnosis not present

## 2016-05-26 DIAGNOSIS — I110 Hypertensive heart disease with heart failure: Secondary | ICD-10-CM | POA: Diagnosis not present

## 2016-05-26 DIAGNOSIS — L97211 Non-pressure chronic ulcer of right calf limited to breakdown of skin: Secondary | ICD-10-CM | POA: Diagnosis not present

## 2016-05-26 DIAGNOSIS — L97221 Non-pressure chronic ulcer of left calf limited to breakdown of skin: Secondary | ICD-10-CM | POA: Diagnosis not present

## 2016-05-26 DIAGNOSIS — I890 Lymphedema, not elsewhere classified: Secondary | ICD-10-CM | POA: Diagnosis not present

## 2016-05-30 DIAGNOSIS — L97221 Non-pressure chronic ulcer of left calf limited to breakdown of skin: Secondary | ICD-10-CM | POA: Diagnosis not present

## 2016-05-30 DIAGNOSIS — I110 Hypertensive heart disease with heart failure: Secondary | ICD-10-CM | POA: Diagnosis not present

## 2016-05-30 DIAGNOSIS — I5032 Chronic diastolic (congestive) heart failure: Secondary | ICD-10-CM | POA: Diagnosis not present

## 2016-05-30 DIAGNOSIS — Z6841 Body Mass Index (BMI) 40.0 and over, adult: Secondary | ICD-10-CM | POA: Diagnosis not present

## 2016-05-30 DIAGNOSIS — L97821 Non-pressure chronic ulcer of other part of left lower leg limited to breakdown of skin: Secondary | ICD-10-CM | POA: Diagnosis not present

## 2016-05-30 DIAGNOSIS — R0602 Shortness of breath: Secondary | ICD-10-CM | POA: Diagnosis not present

## 2016-05-30 DIAGNOSIS — I878 Other specified disorders of veins: Secondary | ICD-10-CM | POA: Diagnosis not present

## 2016-05-30 DIAGNOSIS — R740 Nonspecific elevation of levels of transaminase and lactic acid dehydrogenase [LDH]: Secondary | ICD-10-CM | POA: Diagnosis not present

## 2016-05-30 DIAGNOSIS — R112 Nausea with vomiting, unspecified: Secondary | ICD-10-CM | POA: Diagnosis not present

## 2016-05-30 DIAGNOSIS — I890 Lymphedema, not elsewhere classified: Secondary | ICD-10-CM | POA: Diagnosis not present

## 2016-05-30 DIAGNOSIS — L97811 Non-pressure chronic ulcer of other part of right lower leg limited to breakdown of skin: Secondary | ICD-10-CM | POA: Diagnosis not present

## 2016-05-30 DIAGNOSIS — Z8673 Personal history of transient ischemic attack (TIA), and cerebral infarction without residual deficits: Secondary | ICD-10-CM | POA: Diagnosis not present

## 2016-05-30 DIAGNOSIS — R509 Fever, unspecified: Secondary | ICD-10-CM | POA: Diagnosis not present

## 2016-05-30 DIAGNOSIS — I87313 Chronic venous hypertension (idiopathic) with ulcer of bilateral lower extremity: Secondary | ICD-10-CM | POA: Diagnosis not present

## 2016-05-30 DIAGNOSIS — I87333 Chronic venous hypertension (idiopathic) with ulcer and inflammation of bilateral lower extremity: Secondary | ICD-10-CM | POA: Diagnosis not present

## 2016-05-30 DIAGNOSIS — R0902 Hypoxemia: Secondary | ICD-10-CM | POA: Diagnosis not present

## 2016-05-30 DIAGNOSIS — I679 Cerebrovascular disease, unspecified: Secondary | ICD-10-CM | POA: Diagnosis not present

## 2016-05-31 DIAGNOSIS — R509 Fever, unspecified: Secondary | ICD-10-CM | POA: Diagnosis not present

## 2016-05-31 DIAGNOSIS — I679 Cerebrovascular disease, unspecified: Secondary | ICD-10-CM | POA: Diagnosis not present

## 2016-05-31 DIAGNOSIS — I110 Hypertensive heart disease with heart failure: Secondary | ICD-10-CM | POA: Diagnosis not present

## 2016-05-31 DIAGNOSIS — R0902 Hypoxemia: Secondary | ICD-10-CM | POA: Diagnosis not present

## 2016-06-02 DIAGNOSIS — L97821 Non-pressure chronic ulcer of other part of left lower leg limited to breakdown of skin: Secondary | ICD-10-CM | POA: Diagnosis not present

## 2016-06-02 DIAGNOSIS — I110 Hypertensive heart disease with heart failure: Secondary | ICD-10-CM | POA: Diagnosis not present

## 2016-06-02 DIAGNOSIS — I890 Lymphedema, not elsewhere classified: Secondary | ICD-10-CM | POA: Diagnosis not present

## 2016-06-02 DIAGNOSIS — I87333 Chronic venous hypertension (idiopathic) with ulcer and inflammation of bilateral lower extremity: Secondary | ICD-10-CM | POA: Diagnosis not present

## 2016-06-02 DIAGNOSIS — L97211 Non-pressure chronic ulcer of right calf limited to breakdown of skin: Secondary | ICD-10-CM | POA: Diagnosis not present

## 2016-06-02 DIAGNOSIS — L97221 Non-pressure chronic ulcer of left calf limited to breakdown of skin: Secondary | ICD-10-CM | POA: Diagnosis not present

## 2016-06-13 DIAGNOSIS — L97221 Non-pressure chronic ulcer of left calf limited to breakdown of skin: Secondary | ICD-10-CM | POA: Diagnosis not present

## 2016-06-13 DIAGNOSIS — Z8673 Personal history of transient ischemic attack (TIA), and cerebral infarction without residual deficits: Secondary | ICD-10-CM | POA: Diagnosis not present

## 2016-06-13 DIAGNOSIS — I890 Lymphedema, not elsewhere classified: Secondary | ICD-10-CM | POA: Diagnosis not present

## 2016-06-13 DIAGNOSIS — I87333 Chronic venous hypertension (idiopathic) with ulcer and inflammation of bilateral lower extremity: Secondary | ICD-10-CM | POA: Diagnosis not present

## 2016-06-13 DIAGNOSIS — I87331 Chronic venous hypertension (idiopathic) with ulcer and inflammation of right lower extremity: Secondary | ICD-10-CM | POA: Diagnosis not present

## 2016-06-13 DIAGNOSIS — L97811 Non-pressure chronic ulcer of other part of right lower leg limited to breakdown of skin: Secondary | ICD-10-CM | POA: Diagnosis not present

## 2016-06-13 DIAGNOSIS — L97821 Non-pressure chronic ulcer of other part of left lower leg limited to breakdown of skin: Secondary | ICD-10-CM | POA: Diagnosis not present

## 2016-06-14 DIAGNOSIS — Z23 Encounter for immunization: Secondary | ICD-10-CM | POA: Diagnosis not present

## 2016-06-16 DIAGNOSIS — I890 Lymphedema, not elsewhere classified: Secondary | ICD-10-CM | POA: Diagnosis not present

## 2016-06-16 DIAGNOSIS — L97211 Non-pressure chronic ulcer of right calf limited to breakdown of skin: Secondary | ICD-10-CM | POA: Diagnosis not present

## 2016-06-16 DIAGNOSIS — L97821 Non-pressure chronic ulcer of other part of left lower leg limited to breakdown of skin: Secondary | ICD-10-CM | POA: Diagnosis not present

## 2016-06-16 DIAGNOSIS — I110 Hypertensive heart disease with heart failure: Secondary | ICD-10-CM | POA: Diagnosis not present

## 2016-06-16 DIAGNOSIS — L97221 Non-pressure chronic ulcer of left calf limited to breakdown of skin: Secondary | ICD-10-CM | POA: Diagnosis not present

## 2016-06-16 DIAGNOSIS — I87333 Chronic venous hypertension (idiopathic) with ulcer and inflammation of bilateral lower extremity: Secondary | ICD-10-CM | POA: Diagnosis not present

## 2016-06-20 ENCOUNTER — Encounter (HOSPITAL_BASED_OUTPATIENT_CLINIC_OR_DEPARTMENT_OTHER): Payer: Medicare Other | Attending: Surgery

## 2016-06-20 DIAGNOSIS — Z6841 Body Mass Index (BMI) 40.0 and over, adult: Secondary | ICD-10-CM | POA: Insufficient documentation

## 2016-06-20 DIAGNOSIS — L97821 Non-pressure chronic ulcer of other part of left lower leg limited to breakdown of skin: Secondary | ICD-10-CM | POA: Diagnosis not present

## 2016-06-20 DIAGNOSIS — I87333 Chronic venous hypertension (idiopathic) with ulcer and inflammation of bilateral lower extremity: Secondary | ICD-10-CM | POA: Insufficient documentation

## 2016-06-20 DIAGNOSIS — I87312 Chronic venous hypertension (idiopathic) with ulcer of left lower extremity: Secondary | ICD-10-CM | POA: Diagnosis not present

## 2016-06-20 DIAGNOSIS — L97822 Non-pressure chronic ulcer of other part of left lower leg with fat layer exposed: Secondary | ICD-10-CM | POA: Diagnosis not present

## 2016-06-20 DIAGNOSIS — I509 Heart failure, unspecified: Secondary | ICD-10-CM | POA: Diagnosis not present

## 2016-06-20 DIAGNOSIS — G4730 Sleep apnea, unspecified: Secondary | ICD-10-CM | POA: Insufficient documentation

## 2016-06-20 DIAGNOSIS — I890 Lymphedema, not elsewhere classified: Secondary | ICD-10-CM | POA: Diagnosis not present

## 2016-06-20 DIAGNOSIS — I110 Hypertensive heart disease with heart failure: Secondary | ICD-10-CM | POA: Diagnosis not present

## 2016-06-20 DIAGNOSIS — Z8673 Personal history of transient ischemic attack (TIA), and cerebral infarction without residual deficits: Secondary | ICD-10-CM | POA: Insufficient documentation

## 2016-06-23 DIAGNOSIS — I110 Hypertensive heart disease with heart failure: Secondary | ICD-10-CM | POA: Diagnosis not present

## 2016-06-23 DIAGNOSIS — L97211 Non-pressure chronic ulcer of right calf limited to breakdown of skin: Secondary | ICD-10-CM | POA: Diagnosis not present

## 2016-06-23 DIAGNOSIS — L97221 Non-pressure chronic ulcer of left calf limited to breakdown of skin: Secondary | ICD-10-CM | POA: Diagnosis not present

## 2016-06-23 DIAGNOSIS — I87333 Chronic venous hypertension (idiopathic) with ulcer and inflammation of bilateral lower extremity: Secondary | ICD-10-CM | POA: Diagnosis not present

## 2016-06-23 DIAGNOSIS — I890 Lymphedema, not elsewhere classified: Secondary | ICD-10-CM | POA: Diagnosis not present

## 2016-06-23 DIAGNOSIS — L97821 Non-pressure chronic ulcer of other part of left lower leg limited to breakdown of skin: Secondary | ICD-10-CM | POA: Diagnosis not present

## 2016-06-27 DIAGNOSIS — I509 Heart failure, unspecified: Secondary | ICD-10-CM | POA: Diagnosis not present

## 2016-06-27 DIAGNOSIS — I890 Lymphedema, not elsewhere classified: Secondary | ICD-10-CM | POA: Diagnosis not present

## 2016-06-27 DIAGNOSIS — I87333 Chronic venous hypertension (idiopathic) with ulcer and inflammation of bilateral lower extremity: Secondary | ICD-10-CM | POA: Diagnosis not present

## 2016-06-27 DIAGNOSIS — L97821 Non-pressure chronic ulcer of other part of left lower leg limited to breakdown of skin: Secondary | ICD-10-CM | POA: Diagnosis not present

## 2016-06-27 DIAGNOSIS — I110 Hypertensive heart disease with heart failure: Secondary | ICD-10-CM | POA: Diagnosis not present

## 2016-06-27 DIAGNOSIS — L97822 Non-pressure chronic ulcer of other part of left lower leg with fat layer exposed: Secondary | ICD-10-CM | POA: Diagnosis not present

## 2016-06-30 DIAGNOSIS — E1129 Type 2 diabetes mellitus with other diabetic kidney complication: Secondary | ICD-10-CM | POA: Diagnosis not present

## 2016-06-30 DIAGNOSIS — L97211 Non-pressure chronic ulcer of right calf limited to breakdown of skin: Secondary | ICD-10-CM | POA: Diagnosis not present

## 2016-06-30 DIAGNOSIS — L97821 Non-pressure chronic ulcer of other part of left lower leg limited to breakdown of skin: Secondary | ICD-10-CM | POA: Diagnosis not present

## 2016-06-30 DIAGNOSIS — I639 Cerebral infarction, unspecified: Secondary | ICD-10-CM | POA: Diagnosis not present

## 2016-06-30 DIAGNOSIS — L97221 Non-pressure chronic ulcer of left calf limited to breakdown of skin: Secondary | ICD-10-CM | POA: Diagnosis not present

## 2016-06-30 DIAGNOSIS — G4733 Obstructive sleep apnea (adult) (pediatric): Secondary | ICD-10-CM | POA: Diagnosis not present

## 2016-06-30 DIAGNOSIS — I10 Essential (primary) hypertension: Secondary | ICD-10-CM | POA: Diagnosis not present

## 2016-06-30 DIAGNOSIS — I890 Lymphedema, not elsewhere classified: Secondary | ICD-10-CM | POA: Diagnosis not present

## 2016-06-30 DIAGNOSIS — I5032 Chronic diastolic (congestive) heart failure: Secondary | ICD-10-CM | POA: Diagnosis not present

## 2016-06-30 DIAGNOSIS — I110 Hypertensive heart disease with heart failure: Secondary | ICD-10-CM | POA: Diagnosis not present

## 2016-06-30 DIAGNOSIS — Z6841 Body Mass Index (BMI) 40.0 and over, adult: Secondary | ICD-10-CM | POA: Diagnosis not present

## 2016-06-30 DIAGNOSIS — I87333 Chronic venous hypertension (idiopathic) with ulcer and inflammation of bilateral lower extremity: Secondary | ICD-10-CM | POA: Diagnosis not present

## 2016-06-30 DIAGNOSIS — E1121 Type 2 diabetes mellitus with diabetic nephropathy: Secondary | ICD-10-CM | POA: Diagnosis not present

## 2016-07-04 DIAGNOSIS — L97821 Non-pressure chronic ulcer of other part of left lower leg limited to breakdown of skin: Secondary | ICD-10-CM | POA: Diagnosis not present

## 2016-07-04 DIAGNOSIS — L97822 Non-pressure chronic ulcer of other part of left lower leg with fat layer exposed: Secondary | ICD-10-CM | POA: Diagnosis not present

## 2016-07-04 DIAGNOSIS — I890 Lymphedema, not elsewhere classified: Secondary | ICD-10-CM | POA: Diagnosis not present

## 2016-07-04 DIAGNOSIS — I87312 Chronic venous hypertension (idiopathic) with ulcer of left lower extremity: Secondary | ICD-10-CM | POA: Diagnosis not present

## 2016-07-04 DIAGNOSIS — L97222 Non-pressure chronic ulcer of left calf with fat layer exposed: Secondary | ICD-10-CM | POA: Diagnosis not present

## 2016-07-04 DIAGNOSIS — I509 Heart failure, unspecified: Secondary | ICD-10-CM | POA: Diagnosis not present

## 2016-07-04 DIAGNOSIS — I87333 Chronic venous hypertension (idiopathic) with ulcer and inflammation of bilateral lower extremity: Secondary | ICD-10-CM | POA: Diagnosis not present

## 2016-07-04 DIAGNOSIS — I110 Hypertensive heart disease with heart failure: Secondary | ICD-10-CM | POA: Diagnosis not present

## 2016-07-07 DIAGNOSIS — I87333 Chronic venous hypertension (idiopathic) with ulcer and inflammation of bilateral lower extremity: Secondary | ICD-10-CM | POA: Diagnosis not present

## 2016-07-07 DIAGNOSIS — L97211 Non-pressure chronic ulcer of right calf limited to breakdown of skin: Secondary | ICD-10-CM | POA: Diagnosis not present

## 2016-07-07 DIAGNOSIS — I110 Hypertensive heart disease with heart failure: Secondary | ICD-10-CM | POA: Diagnosis not present

## 2016-07-07 DIAGNOSIS — L97221 Non-pressure chronic ulcer of left calf limited to breakdown of skin: Secondary | ICD-10-CM | POA: Diagnosis not present

## 2016-07-07 DIAGNOSIS — L97821 Non-pressure chronic ulcer of other part of left lower leg limited to breakdown of skin: Secondary | ICD-10-CM | POA: Diagnosis not present

## 2016-07-07 DIAGNOSIS — I890 Lymphedema, not elsewhere classified: Secondary | ICD-10-CM | POA: Diagnosis not present

## 2016-07-11 DIAGNOSIS — I87333 Chronic venous hypertension (idiopathic) with ulcer and inflammation of bilateral lower extremity: Secondary | ICD-10-CM | POA: Diagnosis not present

## 2016-07-11 DIAGNOSIS — I890 Lymphedema, not elsewhere classified: Secondary | ICD-10-CM | POA: Diagnosis not present

## 2016-07-11 DIAGNOSIS — I110 Hypertensive heart disease with heart failure: Secondary | ICD-10-CM | POA: Diagnosis not present

## 2016-07-11 DIAGNOSIS — L97822 Non-pressure chronic ulcer of other part of left lower leg with fat layer exposed: Secondary | ICD-10-CM | POA: Diagnosis not present

## 2016-07-11 DIAGNOSIS — L97821 Non-pressure chronic ulcer of other part of left lower leg limited to breakdown of skin: Secondary | ICD-10-CM | POA: Diagnosis not present

## 2016-07-11 DIAGNOSIS — I87332 Chronic venous hypertension (idiopathic) with ulcer and inflammation of left lower extremity: Secondary | ICD-10-CM | POA: Diagnosis not present

## 2016-07-11 DIAGNOSIS — I509 Heart failure, unspecified: Secondary | ICD-10-CM | POA: Diagnosis not present

## 2016-07-14 DIAGNOSIS — E1129 Type 2 diabetes mellitus with other diabetic kidney complication: Secondary | ICD-10-CM | POA: Diagnosis not present

## 2016-07-14 DIAGNOSIS — L97221 Non-pressure chronic ulcer of left calf limited to breakdown of skin: Secondary | ICD-10-CM | POA: Diagnosis not present

## 2016-07-14 DIAGNOSIS — L97211 Non-pressure chronic ulcer of right calf limited to breakdown of skin: Secondary | ICD-10-CM | POA: Diagnosis not present

## 2016-07-14 DIAGNOSIS — L97821 Non-pressure chronic ulcer of other part of left lower leg limited to breakdown of skin: Secondary | ICD-10-CM | POA: Diagnosis not present

## 2016-07-14 DIAGNOSIS — I87333 Chronic venous hypertension (idiopathic) with ulcer and inflammation of bilateral lower extremity: Secondary | ICD-10-CM | POA: Diagnosis not present

## 2016-07-14 DIAGNOSIS — E7800 Pure hypercholesterolemia, unspecified: Secondary | ICD-10-CM | POA: Diagnosis not present

## 2016-07-14 DIAGNOSIS — I110 Hypertensive heart disease with heart failure: Secondary | ICD-10-CM | POA: Diagnosis not present

## 2016-07-14 DIAGNOSIS — I890 Lymphedema, not elsewhere classified: Secondary | ICD-10-CM | POA: Diagnosis not present

## 2016-07-14 DIAGNOSIS — I10 Essential (primary) hypertension: Secondary | ICD-10-CM | POA: Diagnosis not present

## 2016-07-18 DIAGNOSIS — I110 Hypertensive heart disease with heart failure: Secondary | ICD-10-CM | POA: Diagnosis not present

## 2016-07-18 DIAGNOSIS — L97821 Non-pressure chronic ulcer of other part of left lower leg limited to breakdown of skin: Secondary | ICD-10-CM | POA: Diagnosis not present

## 2016-07-18 DIAGNOSIS — L97822 Non-pressure chronic ulcer of other part of left lower leg with fat layer exposed: Secondary | ICD-10-CM | POA: Diagnosis not present

## 2016-07-18 DIAGNOSIS — I87333 Chronic venous hypertension (idiopathic) with ulcer and inflammation of bilateral lower extremity: Secondary | ICD-10-CM | POA: Diagnosis not present

## 2016-07-18 DIAGNOSIS — I87312 Chronic venous hypertension (idiopathic) with ulcer of left lower extremity: Secondary | ICD-10-CM | POA: Diagnosis not present

## 2016-07-18 DIAGNOSIS — I890 Lymphedema, not elsewhere classified: Secondary | ICD-10-CM | POA: Diagnosis not present

## 2016-07-18 DIAGNOSIS — I509 Heart failure, unspecified: Secondary | ICD-10-CM | POA: Diagnosis not present

## 2016-07-19 DIAGNOSIS — E7800 Pure hypercholesterolemia, unspecified: Secondary | ICD-10-CM | POA: Diagnosis not present

## 2016-07-19 DIAGNOSIS — E1129 Type 2 diabetes mellitus with other diabetic kidney complication: Secondary | ICD-10-CM | POA: Diagnosis not present

## 2016-07-19 DIAGNOSIS — E1121 Type 2 diabetes mellitus with diabetic nephropathy: Secondary | ICD-10-CM | POA: Diagnosis not present

## 2016-07-19 DIAGNOSIS — Z6841 Body Mass Index (BMI) 40.0 and over, adult: Secondary | ICD-10-CM | POA: Diagnosis not present

## 2016-07-19 DIAGNOSIS — G4733 Obstructive sleep apnea (adult) (pediatric): Secondary | ICD-10-CM | POA: Diagnosis not present

## 2016-07-19 DIAGNOSIS — I10 Essential (primary) hypertension: Secondary | ICD-10-CM | POA: Diagnosis not present

## 2016-07-21 DIAGNOSIS — L97821 Non-pressure chronic ulcer of other part of left lower leg limited to breakdown of skin: Secondary | ICD-10-CM | POA: Diagnosis not present

## 2016-07-21 DIAGNOSIS — I87333 Chronic venous hypertension (idiopathic) with ulcer and inflammation of bilateral lower extremity: Secondary | ICD-10-CM | POA: Diagnosis not present

## 2016-07-21 DIAGNOSIS — L97221 Non-pressure chronic ulcer of left calf limited to breakdown of skin: Secondary | ICD-10-CM | POA: Diagnosis not present

## 2016-07-21 DIAGNOSIS — I890 Lymphedema, not elsewhere classified: Secondary | ICD-10-CM | POA: Diagnosis not present

## 2016-07-21 DIAGNOSIS — L97211 Non-pressure chronic ulcer of right calf limited to breakdown of skin: Secondary | ICD-10-CM | POA: Diagnosis not present

## 2016-07-21 DIAGNOSIS — I110 Hypertensive heart disease with heart failure: Secondary | ICD-10-CM | POA: Diagnosis not present

## 2016-07-25 ENCOUNTER — Encounter (HOSPITAL_BASED_OUTPATIENT_CLINIC_OR_DEPARTMENT_OTHER): Payer: Medicare Other | Attending: Surgery

## 2016-07-25 DIAGNOSIS — L97821 Non-pressure chronic ulcer of other part of left lower leg limited to breakdown of skin: Secondary | ICD-10-CM | POA: Insufficient documentation

## 2016-07-25 DIAGNOSIS — I87333 Chronic venous hypertension (idiopathic) with ulcer and inflammation of bilateral lower extremity: Secondary | ICD-10-CM | POA: Diagnosis not present

## 2016-07-25 DIAGNOSIS — I890 Lymphedema, not elsewhere classified: Secondary | ICD-10-CM | POA: Diagnosis not present

## 2016-07-25 DIAGNOSIS — I110 Hypertensive heart disease with heart failure: Secondary | ICD-10-CM | POA: Diagnosis not present

## 2016-07-25 DIAGNOSIS — I10 Essential (primary) hypertension: Secondary | ICD-10-CM | POA: Insufficient documentation

## 2016-07-25 DIAGNOSIS — L97221 Non-pressure chronic ulcer of left calf limited to breakdown of skin: Secondary | ICD-10-CM | POA: Diagnosis not present

## 2016-07-25 DIAGNOSIS — I509 Heart failure, unspecified: Secondary | ICD-10-CM | POA: Diagnosis not present

## 2016-07-25 DIAGNOSIS — I87332 Chronic venous hypertension (idiopathic) with ulcer and inflammation of left lower extremity: Secondary | ICD-10-CM | POA: Diagnosis not present

## 2016-07-25 DIAGNOSIS — L97822 Non-pressure chronic ulcer of other part of left lower leg with fat layer exposed: Secondary | ICD-10-CM | POA: Diagnosis not present

## 2016-07-25 DIAGNOSIS — G4730 Sleep apnea, unspecified: Secondary | ICD-10-CM | POA: Diagnosis not present

## 2016-07-28 DIAGNOSIS — L97221 Non-pressure chronic ulcer of left calf limited to breakdown of skin: Secondary | ICD-10-CM | POA: Diagnosis not present

## 2016-07-28 DIAGNOSIS — I87333 Chronic venous hypertension (idiopathic) with ulcer and inflammation of bilateral lower extremity: Secondary | ICD-10-CM | POA: Diagnosis not present

## 2016-07-28 DIAGNOSIS — I890 Lymphedema, not elsewhere classified: Secondary | ICD-10-CM | POA: Diagnosis not present

## 2016-07-28 DIAGNOSIS — L97821 Non-pressure chronic ulcer of other part of left lower leg limited to breakdown of skin: Secondary | ICD-10-CM | POA: Diagnosis not present

## 2016-07-28 DIAGNOSIS — I509 Heart failure, unspecified: Secondary | ICD-10-CM | POA: Diagnosis not present

## 2016-07-28 DIAGNOSIS — I110 Hypertensive heart disease with heart failure: Secondary | ICD-10-CM | POA: Diagnosis not present

## 2016-08-01 DIAGNOSIS — I10 Essential (primary) hypertension: Secondary | ICD-10-CM | POA: Diagnosis not present

## 2016-08-01 DIAGNOSIS — G4730 Sleep apnea, unspecified: Secondary | ICD-10-CM | POA: Diagnosis not present

## 2016-08-01 DIAGNOSIS — I87333 Chronic venous hypertension (idiopathic) with ulcer and inflammation of bilateral lower extremity: Secondary | ICD-10-CM | POA: Diagnosis not present

## 2016-08-01 DIAGNOSIS — L97821 Non-pressure chronic ulcer of other part of left lower leg limited to breakdown of skin: Secondary | ICD-10-CM | POA: Diagnosis not present

## 2016-08-01 DIAGNOSIS — I890 Lymphedema, not elsewhere classified: Secondary | ICD-10-CM | POA: Diagnosis not present

## 2016-08-04 DIAGNOSIS — L97221 Non-pressure chronic ulcer of left calf limited to breakdown of skin: Secondary | ICD-10-CM | POA: Diagnosis not present

## 2016-08-04 DIAGNOSIS — I509 Heart failure, unspecified: Secondary | ICD-10-CM | POA: Diagnosis not present

## 2016-08-04 DIAGNOSIS — L97821 Non-pressure chronic ulcer of other part of left lower leg limited to breakdown of skin: Secondary | ICD-10-CM | POA: Diagnosis not present

## 2016-08-04 DIAGNOSIS — I110 Hypertensive heart disease with heart failure: Secondary | ICD-10-CM | POA: Diagnosis not present

## 2016-08-04 DIAGNOSIS — I890 Lymphedema, not elsewhere classified: Secondary | ICD-10-CM | POA: Diagnosis not present

## 2016-08-04 DIAGNOSIS — I87333 Chronic venous hypertension (idiopathic) with ulcer and inflammation of bilateral lower extremity: Secondary | ICD-10-CM | POA: Diagnosis not present

## 2016-08-08 DIAGNOSIS — I87333 Chronic venous hypertension (idiopathic) with ulcer and inflammation of bilateral lower extremity: Secondary | ICD-10-CM | POA: Diagnosis not present

## 2016-08-08 DIAGNOSIS — I10 Essential (primary) hypertension: Secondary | ICD-10-CM | POA: Diagnosis not present

## 2016-08-08 DIAGNOSIS — I890 Lymphedema, not elsewhere classified: Secondary | ICD-10-CM | POA: Diagnosis not present

## 2016-08-08 DIAGNOSIS — G4730 Sleep apnea, unspecified: Secondary | ICD-10-CM | POA: Diagnosis not present

## 2016-08-08 DIAGNOSIS — L97821 Non-pressure chronic ulcer of other part of left lower leg limited to breakdown of skin: Secondary | ICD-10-CM | POA: Diagnosis not present

## 2016-08-11 DIAGNOSIS — L97221 Non-pressure chronic ulcer of left calf limited to breakdown of skin: Secondary | ICD-10-CM | POA: Diagnosis not present

## 2016-08-11 DIAGNOSIS — I890 Lymphedema, not elsewhere classified: Secondary | ICD-10-CM | POA: Diagnosis not present

## 2016-08-11 DIAGNOSIS — I87333 Chronic venous hypertension (idiopathic) with ulcer and inflammation of bilateral lower extremity: Secondary | ICD-10-CM | POA: Diagnosis not present

## 2016-08-11 DIAGNOSIS — I110 Hypertensive heart disease with heart failure: Secondary | ICD-10-CM | POA: Diagnosis not present

## 2016-08-11 DIAGNOSIS — I509 Heart failure, unspecified: Secondary | ICD-10-CM | POA: Diagnosis not present

## 2016-08-11 DIAGNOSIS — L97821 Non-pressure chronic ulcer of other part of left lower leg limited to breakdown of skin: Secondary | ICD-10-CM | POA: Diagnosis not present

## 2016-08-15 DIAGNOSIS — I10 Essential (primary) hypertension: Secondary | ICD-10-CM | POA: Diagnosis not present

## 2016-08-15 DIAGNOSIS — I87312 Chronic venous hypertension (idiopathic) with ulcer of left lower extremity: Secondary | ICD-10-CM | POA: Diagnosis not present

## 2016-08-15 DIAGNOSIS — I890 Lymphedema, not elsewhere classified: Secondary | ICD-10-CM | POA: Diagnosis not present

## 2016-08-15 DIAGNOSIS — L97821 Non-pressure chronic ulcer of other part of left lower leg limited to breakdown of skin: Secondary | ICD-10-CM | POA: Diagnosis not present

## 2016-08-15 DIAGNOSIS — G4730 Sleep apnea, unspecified: Secondary | ICD-10-CM | POA: Diagnosis not present

## 2016-08-15 DIAGNOSIS — I87333 Chronic venous hypertension (idiopathic) with ulcer and inflammation of bilateral lower extremity: Secondary | ICD-10-CM | POA: Diagnosis not present

## 2016-08-18 DIAGNOSIS — I87333 Chronic venous hypertension (idiopathic) with ulcer and inflammation of bilateral lower extremity: Secondary | ICD-10-CM | POA: Diagnosis not present

## 2016-08-18 DIAGNOSIS — I509 Heart failure, unspecified: Secondary | ICD-10-CM | POA: Diagnosis not present

## 2016-08-18 DIAGNOSIS — L97221 Non-pressure chronic ulcer of left calf limited to breakdown of skin: Secondary | ICD-10-CM | POA: Diagnosis not present

## 2016-08-18 DIAGNOSIS — I890 Lymphedema, not elsewhere classified: Secondary | ICD-10-CM | POA: Diagnosis not present

## 2016-08-18 DIAGNOSIS — L97821 Non-pressure chronic ulcer of other part of left lower leg limited to breakdown of skin: Secondary | ICD-10-CM | POA: Diagnosis not present

## 2016-08-18 DIAGNOSIS — I110 Hypertensive heart disease with heart failure: Secondary | ICD-10-CM | POA: Diagnosis not present

## 2016-08-22 ENCOUNTER — Encounter (HOSPITAL_BASED_OUTPATIENT_CLINIC_OR_DEPARTMENT_OTHER): Payer: Medicare Other | Attending: Surgery

## 2016-08-22 DIAGNOSIS — I10 Essential (primary) hypertension: Secondary | ICD-10-CM | POA: Diagnosis not present

## 2016-08-22 DIAGNOSIS — G4730 Sleep apnea, unspecified: Secondary | ICD-10-CM | POA: Diagnosis not present

## 2016-08-22 DIAGNOSIS — I87332 Chronic venous hypertension (idiopathic) with ulcer and inflammation of left lower extremity: Secondary | ICD-10-CM | POA: Insufficient documentation

## 2016-08-22 DIAGNOSIS — I890 Lymphedema, not elsewhere classified: Secondary | ICD-10-CM | POA: Insufficient documentation

## 2016-08-22 DIAGNOSIS — L97821 Non-pressure chronic ulcer of other part of left lower leg limited to breakdown of skin: Secondary | ICD-10-CM | POA: Diagnosis not present

## 2016-08-25 DIAGNOSIS — I87333 Chronic venous hypertension (idiopathic) with ulcer and inflammation of bilateral lower extremity: Secondary | ICD-10-CM | POA: Diagnosis not present

## 2016-08-25 DIAGNOSIS — I509 Heart failure, unspecified: Secondary | ICD-10-CM | POA: Diagnosis not present

## 2016-08-25 DIAGNOSIS — L97821 Non-pressure chronic ulcer of other part of left lower leg limited to breakdown of skin: Secondary | ICD-10-CM | POA: Diagnosis not present

## 2016-08-25 DIAGNOSIS — L97221 Non-pressure chronic ulcer of left calf limited to breakdown of skin: Secondary | ICD-10-CM | POA: Diagnosis not present

## 2016-08-25 DIAGNOSIS — I890 Lymphedema, not elsewhere classified: Secondary | ICD-10-CM | POA: Diagnosis not present

## 2016-08-25 DIAGNOSIS — I110 Hypertensive heart disease with heart failure: Secondary | ICD-10-CM | POA: Diagnosis not present

## 2016-08-29 DIAGNOSIS — I10 Essential (primary) hypertension: Secondary | ICD-10-CM | POA: Diagnosis not present

## 2016-08-29 DIAGNOSIS — L97821 Non-pressure chronic ulcer of other part of left lower leg limited to breakdown of skin: Secondary | ICD-10-CM | POA: Diagnosis not present

## 2016-08-29 DIAGNOSIS — L958 Other vasculitis limited to the skin: Secondary | ICD-10-CM | POA: Diagnosis not present

## 2016-08-29 DIAGNOSIS — I87322 Chronic venous hypertension (idiopathic) with inflammation of left lower extremity: Secondary | ICD-10-CM | POA: Diagnosis not present

## 2016-08-29 DIAGNOSIS — I87332 Chronic venous hypertension (idiopathic) with ulcer and inflammation of left lower extremity: Secondary | ICD-10-CM | POA: Diagnosis not present

## 2016-08-29 DIAGNOSIS — I87333 Chronic venous hypertension (idiopathic) with ulcer and inflammation of bilateral lower extremity: Secondary | ICD-10-CM | POA: Diagnosis not present

## 2016-08-29 DIAGNOSIS — I890 Lymphedema, not elsewhere classified: Secondary | ICD-10-CM | POA: Diagnosis not present

## 2016-08-29 DIAGNOSIS — G4730 Sleep apnea, unspecified: Secondary | ICD-10-CM | POA: Diagnosis not present

## 2016-09-01 DIAGNOSIS — I87333 Chronic venous hypertension (idiopathic) with ulcer and inflammation of bilateral lower extremity: Secondary | ICD-10-CM | POA: Diagnosis not present

## 2016-09-01 DIAGNOSIS — I890 Lymphedema, not elsewhere classified: Secondary | ICD-10-CM | POA: Diagnosis not present

## 2016-09-01 DIAGNOSIS — L97221 Non-pressure chronic ulcer of left calf limited to breakdown of skin: Secondary | ICD-10-CM | POA: Diagnosis not present

## 2016-09-01 DIAGNOSIS — I110 Hypertensive heart disease with heart failure: Secondary | ICD-10-CM | POA: Diagnosis not present

## 2016-09-01 DIAGNOSIS — L97821 Non-pressure chronic ulcer of other part of left lower leg limited to breakdown of skin: Secondary | ICD-10-CM | POA: Diagnosis not present

## 2016-09-01 DIAGNOSIS — I509 Heart failure, unspecified: Secondary | ICD-10-CM | POA: Diagnosis not present

## 2016-09-05 DIAGNOSIS — L97821 Non-pressure chronic ulcer of other part of left lower leg limited to breakdown of skin: Secondary | ICD-10-CM | POA: Diagnosis not present

## 2016-09-05 DIAGNOSIS — I87332 Chronic venous hypertension (idiopathic) with ulcer and inflammation of left lower extremity: Secondary | ICD-10-CM | POA: Diagnosis not present

## 2016-09-05 DIAGNOSIS — I10 Essential (primary) hypertension: Secondary | ICD-10-CM | POA: Diagnosis not present

## 2016-09-05 DIAGNOSIS — I890 Lymphedema, not elsewhere classified: Secondary | ICD-10-CM | POA: Diagnosis not present

## 2016-09-05 DIAGNOSIS — G4730 Sleep apnea, unspecified: Secondary | ICD-10-CM | POA: Diagnosis not present

## 2016-09-08 DIAGNOSIS — L97221 Non-pressure chronic ulcer of left calf limited to breakdown of skin: Secondary | ICD-10-CM | POA: Diagnosis not present

## 2016-09-08 DIAGNOSIS — I509 Heart failure, unspecified: Secondary | ICD-10-CM | POA: Diagnosis not present

## 2016-09-08 DIAGNOSIS — I110 Hypertensive heart disease with heart failure: Secondary | ICD-10-CM | POA: Diagnosis not present

## 2016-09-08 DIAGNOSIS — I890 Lymphedema, not elsewhere classified: Secondary | ICD-10-CM | POA: Diagnosis not present

## 2016-09-08 DIAGNOSIS — L97821 Non-pressure chronic ulcer of other part of left lower leg limited to breakdown of skin: Secondary | ICD-10-CM | POA: Diagnosis not present

## 2016-09-08 DIAGNOSIS — I87333 Chronic venous hypertension (idiopathic) with ulcer and inflammation of bilateral lower extremity: Secondary | ICD-10-CM | POA: Diagnosis not present

## 2016-09-12 DIAGNOSIS — I87333 Chronic venous hypertension (idiopathic) with ulcer and inflammation of bilateral lower extremity: Secondary | ICD-10-CM | POA: Diagnosis not present

## 2016-09-12 DIAGNOSIS — I890 Lymphedema, not elsewhere classified: Secondary | ICD-10-CM | POA: Diagnosis not present

## 2016-09-12 DIAGNOSIS — I87332 Chronic venous hypertension (idiopathic) with ulcer and inflammation of left lower extremity: Secondary | ICD-10-CM | POA: Diagnosis not present

## 2016-09-12 DIAGNOSIS — G4730 Sleep apnea, unspecified: Secondary | ICD-10-CM | POA: Diagnosis not present

## 2016-09-12 DIAGNOSIS — I10 Essential (primary) hypertension: Secondary | ICD-10-CM | POA: Diagnosis not present

## 2016-09-12 DIAGNOSIS — L97821 Non-pressure chronic ulcer of other part of left lower leg limited to breakdown of skin: Secondary | ICD-10-CM | POA: Diagnosis not present

## 2016-09-15 DIAGNOSIS — I110 Hypertensive heart disease with heart failure: Secondary | ICD-10-CM | POA: Diagnosis not present

## 2016-09-15 DIAGNOSIS — I890 Lymphedema, not elsewhere classified: Secondary | ICD-10-CM | POA: Diagnosis not present

## 2016-09-15 DIAGNOSIS — L97221 Non-pressure chronic ulcer of left calf limited to breakdown of skin: Secondary | ICD-10-CM | POA: Diagnosis not present

## 2016-09-15 DIAGNOSIS — I509 Heart failure, unspecified: Secondary | ICD-10-CM | POA: Diagnosis not present

## 2016-09-15 DIAGNOSIS — L97821 Non-pressure chronic ulcer of other part of left lower leg limited to breakdown of skin: Secondary | ICD-10-CM | POA: Diagnosis not present

## 2016-09-15 DIAGNOSIS — I87333 Chronic venous hypertension (idiopathic) with ulcer and inflammation of bilateral lower extremity: Secondary | ICD-10-CM | POA: Diagnosis not present

## 2016-09-19 ENCOUNTER — Encounter (HOSPITAL_BASED_OUTPATIENT_CLINIC_OR_DEPARTMENT_OTHER): Payer: Medicare Other | Attending: Surgery

## 2016-09-19 DIAGNOSIS — Z6841 Body Mass Index (BMI) 40.0 and over, adult: Secondary | ICD-10-CM | POA: Diagnosis not present

## 2016-09-19 DIAGNOSIS — G4730 Sleep apnea, unspecified: Secondary | ICD-10-CM | POA: Insufficient documentation

## 2016-09-19 DIAGNOSIS — L97821 Non-pressure chronic ulcer of other part of left lower leg limited to breakdown of skin: Secondary | ICD-10-CM | POA: Insufficient documentation

## 2016-09-19 DIAGNOSIS — I10 Essential (primary) hypertension: Secondary | ICD-10-CM | POA: Insufficient documentation

## 2016-09-19 DIAGNOSIS — I87312 Chronic venous hypertension (idiopathic) with ulcer of left lower extremity: Secondary | ICD-10-CM | POA: Diagnosis not present

## 2016-09-19 DIAGNOSIS — I87332 Chronic venous hypertension (idiopathic) with ulcer and inflammation of left lower extremity: Secondary | ICD-10-CM | POA: Insufficient documentation

## 2016-09-19 DIAGNOSIS — I890 Lymphedema, not elsewhere classified: Secondary | ICD-10-CM | POA: Diagnosis not present

## 2016-09-19 DIAGNOSIS — Z8673 Personal history of transient ischemic attack (TIA), and cerebral infarction without residual deficits: Secondary | ICD-10-CM | POA: Diagnosis not present

## 2016-09-19 DIAGNOSIS — I87333 Chronic venous hypertension (idiopathic) with ulcer and inflammation of bilateral lower extremity: Secondary | ICD-10-CM | POA: Diagnosis not present

## 2016-09-22 DIAGNOSIS — I509 Heart failure, unspecified: Secondary | ICD-10-CM | POA: Diagnosis not present

## 2016-09-22 DIAGNOSIS — L97821 Non-pressure chronic ulcer of other part of left lower leg limited to breakdown of skin: Secondary | ICD-10-CM | POA: Diagnosis not present

## 2016-09-22 DIAGNOSIS — I890 Lymphedema, not elsewhere classified: Secondary | ICD-10-CM | POA: Diagnosis not present

## 2016-09-22 DIAGNOSIS — L97221 Non-pressure chronic ulcer of left calf limited to breakdown of skin: Secondary | ICD-10-CM | POA: Diagnosis not present

## 2016-09-22 DIAGNOSIS — I110 Hypertensive heart disease with heart failure: Secondary | ICD-10-CM | POA: Diagnosis not present

## 2016-09-22 DIAGNOSIS — I87333 Chronic venous hypertension (idiopathic) with ulcer and inflammation of bilateral lower extremity: Secondary | ICD-10-CM | POA: Diagnosis not present

## 2016-09-23 DIAGNOSIS — Z7902 Long term (current) use of antithrombotics/antiplatelets: Secondary | ICD-10-CM | POA: Diagnosis not present

## 2016-09-23 DIAGNOSIS — I87332 Chronic venous hypertension (idiopathic) with ulcer and inflammation of left lower extremity: Secondary | ICD-10-CM | POA: Diagnosis not present

## 2016-09-23 DIAGNOSIS — I890 Lymphedema, not elsewhere classified: Secondary | ICD-10-CM | POA: Diagnosis not present

## 2016-09-23 DIAGNOSIS — I110 Hypertensive heart disease with heart failure: Secondary | ICD-10-CM | POA: Diagnosis not present

## 2016-09-23 DIAGNOSIS — L97821 Non-pressure chronic ulcer of other part of left lower leg limited to breakdown of skin: Secondary | ICD-10-CM | POA: Diagnosis not present

## 2016-09-23 DIAGNOSIS — I87321 Chronic venous hypertension (idiopathic) with inflammation of right lower extremity: Secondary | ICD-10-CM | POA: Diagnosis not present

## 2016-09-23 DIAGNOSIS — G4730 Sleep apnea, unspecified: Secondary | ICD-10-CM | POA: Diagnosis not present

## 2016-09-23 DIAGNOSIS — L97221 Non-pressure chronic ulcer of left calf limited to breakdown of skin: Secondary | ICD-10-CM | POA: Diagnosis not present

## 2016-09-23 DIAGNOSIS — I509 Heart failure, unspecified: Secondary | ICD-10-CM | POA: Diagnosis not present

## 2016-09-25 DIAGNOSIS — Z8673 Personal history of transient ischemic attack (TIA), and cerebral infarction without residual deficits: Secondary | ICD-10-CM | POA: Diagnosis not present

## 2016-09-25 DIAGNOSIS — G4730 Sleep apnea, unspecified: Secondary | ICD-10-CM | POA: Diagnosis not present

## 2016-09-25 DIAGNOSIS — I87332 Chronic venous hypertension (idiopathic) with ulcer and inflammation of left lower extremity: Secondary | ICD-10-CM | POA: Diagnosis not present

## 2016-09-25 DIAGNOSIS — L97821 Non-pressure chronic ulcer of other part of left lower leg limited to breakdown of skin: Secondary | ICD-10-CM | POA: Diagnosis not present

## 2016-09-25 DIAGNOSIS — I10 Essential (primary) hypertension: Secondary | ICD-10-CM | POA: Diagnosis not present

## 2016-09-29 DIAGNOSIS — I87332 Chronic venous hypertension (idiopathic) with ulcer and inflammation of left lower extremity: Secondary | ICD-10-CM | POA: Diagnosis not present

## 2016-09-29 DIAGNOSIS — I890 Lymphedema, not elsewhere classified: Secondary | ICD-10-CM | POA: Diagnosis not present

## 2016-09-29 DIAGNOSIS — I110 Hypertensive heart disease with heart failure: Secondary | ICD-10-CM | POA: Diagnosis not present

## 2016-09-29 DIAGNOSIS — L97821 Non-pressure chronic ulcer of other part of left lower leg limited to breakdown of skin: Secondary | ICD-10-CM | POA: Diagnosis not present

## 2016-09-29 DIAGNOSIS — L97221 Non-pressure chronic ulcer of left calf limited to breakdown of skin: Secondary | ICD-10-CM | POA: Diagnosis not present

## 2016-09-29 DIAGNOSIS — I87321 Chronic venous hypertension (idiopathic) with inflammation of right lower extremity: Secondary | ICD-10-CM | POA: Diagnosis not present

## 2016-10-03 DIAGNOSIS — I890 Lymphedema, not elsewhere classified: Secondary | ICD-10-CM | POA: Diagnosis not present

## 2016-10-03 DIAGNOSIS — G4730 Sleep apnea, unspecified: Secondary | ICD-10-CM | POA: Diagnosis not present

## 2016-10-03 DIAGNOSIS — I10 Essential (primary) hypertension: Secondary | ICD-10-CM | POA: Diagnosis not present

## 2016-10-03 DIAGNOSIS — I87333 Chronic venous hypertension (idiopathic) with ulcer and inflammation of bilateral lower extremity: Secondary | ICD-10-CM | POA: Diagnosis not present

## 2016-10-03 DIAGNOSIS — L97821 Non-pressure chronic ulcer of other part of left lower leg limited to breakdown of skin: Secondary | ICD-10-CM | POA: Diagnosis not present

## 2016-10-03 DIAGNOSIS — Z8673 Personal history of transient ischemic attack (TIA), and cerebral infarction without residual deficits: Secondary | ICD-10-CM | POA: Diagnosis not present

## 2016-10-03 DIAGNOSIS — I87332 Chronic venous hypertension (idiopathic) with ulcer and inflammation of left lower extremity: Secondary | ICD-10-CM | POA: Diagnosis not present

## 2016-10-06 DIAGNOSIS — I87321 Chronic venous hypertension (idiopathic) with inflammation of right lower extremity: Secondary | ICD-10-CM | POA: Diagnosis not present

## 2016-10-06 DIAGNOSIS — L97221 Non-pressure chronic ulcer of left calf limited to breakdown of skin: Secondary | ICD-10-CM | POA: Diagnosis not present

## 2016-10-06 DIAGNOSIS — L97821 Non-pressure chronic ulcer of other part of left lower leg limited to breakdown of skin: Secondary | ICD-10-CM | POA: Diagnosis not present

## 2016-10-06 DIAGNOSIS — I890 Lymphedema, not elsewhere classified: Secondary | ICD-10-CM | POA: Diagnosis not present

## 2016-10-06 DIAGNOSIS — I110 Hypertensive heart disease with heart failure: Secondary | ICD-10-CM | POA: Diagnosis not present

## 2016-10-06 DIAGNOSIS — I87332 Chronic venous hypertension (idiopathic) with ulcer and inflammation of left lower extremity: Secondary | ICD-10-CM | POA: Diagnosis not present

## 2016-10-10 DIAGNOSIS — L97821 Non-pressure chronic ulcer of other part of left lower leg limited to breakdown of skin: Secondary | ICD-10-CM | POA: Diagnosis not present

## 2016-10-10 DIAGNOSIS — I10 Essential (primary) hypertension: Secondary | ICD-10-CM | POA: Diagnosis not present

## 2016-10-10 DIAGNOSIS — G4730 Sleep apnea, unspecified: Secondary | ICD-10-CM | POA: Diagnosis not present

## 2016-10-10 DIAGNOSIS — I87332 Chronic venous hypertension (idiopathic) with ulcer and inflammation of left lower extremity: Secondary | ICD-10-CM | POA: Diagnosis not present

## 2016-10-10 DIAGNOSIS — Z8673 Personal history of transient ischemic attack (TIA), and cerebral infarction without residual deficits: Secondary | ICD-10-CM | POA: Diagnosis not present

## 2016-10-13 DIAGNOSIS — L97821 Non-pressure chronic ulcer of other part of left lower leg limited to breakdown of skin: Secondary | ICD-10-CM | POA: Diagnosis not present

## 2016-10-13 DIAGNOSIS — I110 Hypertensive heart disease with heart failure: Secondary | ICD-10-CM | POA: Diagnosis not present

## 2016-10-13 DIAGNOSIS — I87321 Chronic venous hypertension (idiopathic) with inflammation of right lower extremity: Secondary | ICD-10-CM | POA: Diagnosis not present

## 2016-10-13 DIAGNOSIS — I87332 Chronic venous hypertension (idiopathic) with ulcer and inflammation of left lower extremity: Secondary | ICD-10-CM | POA: Diagnosis not present

## 2016-10-13 DIAGNOSIS — L97221 Non-pressure chronic ulcer of left calf limited to breakdown of skin: Secondary | ICD-10-CM | POA: Diagnosis not present

## 2016-10-13 DIAGNOSIS — I890 Lymphedema, not elsewhere classified: Secondary | ICD-10-CM | POA: Diagnosis not present

## 2016-10-17 DIAGNOSIS — M545 Low back pain: Secondary | ICD-10-CM | POA: Diagnosis not present

## 2016-10-17 DIAGNOSIS — M5136 Other intervertebral disc degeneration, lumbar region: Secondary | ICD-10-CM | POA: Diagnosis not present

## 2016-10-18 DIAGNOSIS — I87332 Chronic venous hypertension (idiopathic) with ulcer and inflammation of left lower extremity: Secondary | ICD-10-CM | POA: Diagnosis not present

## 2016-10-18 DIAGNOSIS — G4730 Sleep apnea, unspecified: Secondary | ICD-10-CM | POA: Diagnosis not present

## 2016-10-18 DIAGNOSIS — I890 Lymphedema, not elsewhere classified: Secondary | ICD-10-CM | POA: Diagnosis not present

## 2016-10-18 DIAGNOSIS — L97821 Non-pressure chronic ulcer of other part of left lower leg limited to breakdown of skin: Secondary | ICD-10-CM | POA: Diagnosis not present

## 2016-10-18 DIAGNOSIS — Z8673 Personal history of transient ischemic attack (TIA), and cerebral infarction without residual deficits: Secondary | ICD-10-CM | POA: Diagnosis not present

## 2016-10-18 DIAGNOSIS — I10 Essential (primary) hypertension: Secondary | ICD-10-CM | POA: Diagnosis not present

## 2016-10-20 DIAGNOSIS — I87321 Chronic venous hypertension (idiopathic) with inflammation of right lower extremity: Secondary | ICD-10-CM | POA: Diagnosis not present

## 2016-10-20 DIAGNOSIS — I87332 Chronic venous hypertension (idiopathic) with ulcer and inflammation of left lower extremity: Secondary | ICD-10-CM | POA: Diagnosis not present

## 2016-10-20 DIAGNOSIS — I890 Lymphedema, not elsewhere classified: Secondary | ICD-10-CM | POA: Diagnosis not present

## 2016-10-20 DIAGNOSIS — I110 Hypertensive heart disease with heart failure: Secondary | ICD-10-CM | POA: Diagnosis not present

## 2016-10-20 DIAGNOSIS — L97221 Non-pressure chronic ulcer of left calf limited to breakdown of skin: Secondary | ICD-10-CM | POA: Diagnosis not present

## 2016-10-20 DIAGNOSIS — L97821 Non-pressure chronic ulcer of other part of left lower leg limited to breakdown of skin: Secondary | ICD-10-CM | POA: Diagnosis not present

## 2016-10-23 NOTE — Progress Notes (Deleted)
Cardiology Office Note  Date: 10/23/2016   ID: [name] [name], [name] [name] [birth year], MRN [id number]  PCP: Manon Hilding, MD  Primary Cardiologist: Rozann Lesches, MD   No chief complaint on file.   History of Present Illness: [name] [name] is a 61 y.o. male last seen in July 2017.  Records indicate hospitalization at [facility] in September 2017 with febrile illness.  Patient was seen by Dr. Donnetta Hutching in September 2017 with history of venous stasis ulcerations and failed laser ablations of his left great saphenous vein.  Past Medical History:  Diagnosis Date  . 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 Essential hypertension   . History of nephrolithiasis   . History of open leg wound   . History of stroke July 2004  . Obstructive sleep apnea   . TIA (transient ischemic attack)   . Varicose veins    Laser ablation of left great saphenous vein 2009    Past Surgical History:  Procedure Laterality Date  . CYSTOSCOPY WITH RETROGRADE PYELOGRAM, URETEROSCOPY AND STENT PLACEMENT Right 08/02/2015   Procedure: CYSTOSCOPY WITH RETROGRADE PYELOGRAM, URETEROSCOPY , STONE EXTRACTION AND STENT PLACEMENT;  Surgeon: Cleon Gustin, MD;  Location: [facility];  Service: Urology;  Laterality: Right;  . HOLMIUM LASER APPLICATION Right [id number]   Procedure: HOLMIUM LASER APPLICATION;  Surgeon: Cleon Gustin, MD;  Location: [facility];  Service: Urology;  Laterality: Right;  . KIDNEY STONE SURGERY    . VASECTOMY  1984  . VEIN SURGERY Left 06/2010   Laser ablation    Current Outpatient Prescriptions  Medication Sig Dispense Refill  . acetaminophen (TYLENOL) 500 MG tablet Take 1,000 mg by mouth daily as needed (pain).    Marland Kitchen allopurinol (ZYLOPRIM) 300 MG tablet Take 300 mg by mouth daily.    . Ascorbic Acid (VITAMIN C) 1000 MG tablet Take 6,000 mg by mouth daily as needed (when a cold is coming on.).    Marland Kitchen aspirin EC 325 MG tablet Take 325 mg by mouth daily.    Marland Kitchen atorvastatin (LIPITOR) 10 MG tablet Take 10 mg by  mouth daily.    . clopidogrel (PLAVIX) 75 MG tablet Take 75 mg by mouth daily.    Marland Kitchen doxycycline (VIBRAMYCIN) 100 MG capsule Take 100 mg by mouth 2 (two) times daily.    . furosemide (LASIX) 40 MG tablet Take 40 mg by mouth 2 (two) times daily.     . hydrALAZINE (APRESOLINE) 50 MG tablet Take 1 tablet (50 mg total) by mouth 2 (two) times daily. 180 tablet 3  . ibuprofen (ADVIL,MOTRIN) 200 MG tablet Take 400-600 mg by mouth daily as needed (pain).     . metoprolol succinate (TOPROL-XL) 100 MG 24 hr tablet Take 100 mg by mouth daily. Take with or immediately following a meal.    . potassium chloride SA (K-DUR,KLOR-CON) 20 MEQ tablet Take 20 mEq by mouth 2 (two) times daily.    . valsartan-hydrochlorothiazide (DIOVAN-HCT) 320-25 MG per tablet Take 1 tablet by mouth daily.     No current facility-administered medications for this visit.    Allergies:  Patient has no known allergies.   Social History: The patient  reports that he has never smoked. He has never used smokeless tobacco. He reports that he does not drink alcohol or use drugs.   Family History: The patient's family history includes Bone cancer in his mother; Diabetes in his paternal grandfather; Heart attack in his brother and sister; Hypertension in his brother and father; Pancreatic cancer in  his maternal grandmother; Stroke in his paternal grandmother.   ROS:  Please see the history of present illness. Otherwise, complete review of systems is positive for {NONE DEFAULTED:18576::"none"}.  All other systems are reviewed and negative.   Physical Exam: VS:  There were no vitals taken for this visit., BMI There is no height or weight on file to calculate BMI.  Wt Readings from Last 3 Encounters:  05/25/16 (!) 371 lb 9.6 oz (168.6 kg)  04/14/16 (!) 368 lb (166.9 kg)  09/24/15 (!) 357 lb (161.9 kg)    Morbidly obese male, no distress. HEENT: Conjunctiva and lids normal, oropharynx clear. Neck: Supple, increased girth without obvious  elevated JVP or carotid bruits, no thyromegaly. Lungs: Diminished breath sounds, nonlabored breathing at rest. Cardiac: Regular rate and rhythm, no S3, soft systolic murmur, no pericardial rub. Abdomen: Soft, obese and protuberant, bowel sounds present, no guarding or rebound. Extremities: Bilateral, chronic appearing stasis and edema of the legs, distal pulses 1-2+.  ECG: I personally reviewed the tracing from 04/14/4016 which showed sinus rhythm with low voltage and nonspecific T-wave changes.  Recent Labwork:  July 2017: BUN 24, creatinine 1.1, potassium 4.2, AST 23, ALT 25, cholesterol 119, triglycerides 141, HDL 32, LDL 59, hemoglobin A1c 6.9  Other Studies Reviewed Today:  Echocardiogram 07/05/2015 [facility]): Mild to moderate LVH with LVEF 60-65%, on graded diastolic dysfunction, mild occasional enlargement, mildly thickened mitral valve.  Assessment and Plan:    Current medicines were reviewed with the patient today.  No orders of the defined types were placed in this encounter.   Disposition:  Signed, Satira Sark, MD, [facility] 10/23/2016 3:06 PM    [facility] [place], [place], [place] [address] Phone: [phone]; Fax: [phone]

## 2016-10-24 ENCOUNTER — Encounter (HOSPITAL_BASED_OUTPATIENT_CLINIC_OR_DEPARTMENT_OTHER): Payer: Medicare Other | Attending: Surgery

## 2016-10-24 ENCOUNTER — Encounter: Payer: Self-pay | Admitting: *Deleted

## 2016-10-24 DIAGNOSIS — L97822 Non-pressure chronic ulcer of other part of left lower leg with fat layer exposed: Secondary | ICD-10-CM | POA: Insufficient documentation

## 2016-10-24 DIAGNOSIS — I87332 Chronic venous hypertension (idiopathic) with ulcer and inflammation of left lower extremity: Secondary | ICD-10-CM | POA: Diagnosis not present

## 2016-10-24 DIAGNOSIS — I87322 Chronic venous hypertension (idiopathic) with inflammation of left lower extremity: Secondary | ICD-10-CM | POA: Insufficient documentation

## 2016-10-24 DIAGNOSIS — G4730 Sleep apnea, unspecified: Secondary | ICD-10-CM | POA: Diagnosis not present

## 2016-10-24 DIAGNOSIS — I890 Lymphedema, not elsewhere classified: Secondary | ICD-10-CM | POA: Diagnosis not present

## 2016-10-25 ENCOUNTER — Ambulatory Visit: Payer: Medicare Other | Admitting: Cardiology

## 2016-10-27 DIAGNOSIS — L97221 Non-pressure chronic ulcer of left calf limited to breakdown of skin: Secondary | ICD-10-CM | POA: Diagnosis not present

## 2016-10-27 DIAGNOSIS — I87321 Chronic venous hypertension (idiopathic) with inflammation of right lower extremity: Secondary | ICD-10-CM | POA: Diagnosis not present

## 2016-10-27 DIAGNOSIS — I890 Lymphedema, not elsewhere classified: Secondary | ICD-10-CM | POA: Diagnosis not present

## 2016-10-27 DIAGNOSIS — L97821 Non-pressure chronic ulcer of other part of left lower leg limited to breakdown of skin: Secondary | ICD-10-CM | POA: Diagnosis not present

## 2016-10-27 DIAGNOSIS — I110 Hypertensive heart disease with heart failure: Secondary | ICD-10-CM | POA: Diagnosis not present

## 2016-10-27 DIAGNOSIS — I87332 Chronic venous hypertension (idiopathic) with ulcer and inflammation of left lower extremity: Secondary | ICD-10-CM | POA: Diagnosis not present

## 2016-10-31 DIAGNOSIS — L97822 Non-pressure chronic ulcer of other part of left lower leg with fat layer exposed: Secondary | ICD-10-CM | POA: Diagnosis not present

## 2016-10-31 DIAGNOSIS — M533 Sacrococcygeal disorders, not elsewhere classified: Secondary | ICD-10-CM | POA: Diagnosis not present

## 2016-10-31 DIAGNOSIS — M5442 Lumbago with sciatica, left side: Secondary | ICD-10-CM | POA: Diagnosis not present

## 2016-10-31 DIAGNOSIS — G4730 Sleep apnea, unspecified: Secondary | ICD-10-CM | POA: Diagnosis not present

## 2016-10-31 DIAGNOSIS — M5441 Lumbago with sciatica, right side: Secondary | ICD-10-CM | POA: Diagnosis not present

## 2016-10-31 DIAGNOSIS — M545 Low back pain: Secondary | ICD-10-CM | POA: Diagnosis not present

## 2016-10-31 DIAGNOSIS — I87322 Chronic venous hypertension (idiopathic) with inflammation of left lower extremity: Secondary | ICD-10-CM | POA: Diagnosis not present

## 2016-10-31 DIAGNOSIS — I890 Lymphedema, not elsewhere classified: Secondary | ICD-10-CM | POA: Diagnosis not present

## 2016-11-01 DIAGNOSIS — M545 Low back pain: Secondary | ICD-10-CM | POA: Diagnosis not present

## 2016-11-01 DIAGNOSIS — M5416 Radiculopathy, lumbar region: Secondary | ICD-10-CM | POA: Diagnosis not present

## 2016-11-02 DIAGNOSIS — T1490XA Injury, unspecified, initial encounter: Secondary | ICD-10-CM | POA: Diagnosis not present

## 2016-11-02 DIAGNOSIS — Z6841 Body Mass Index (BMI) 40.0 and over, adult: Secondary | ICD-10-CM | POA: Diagnosis not present

## 2016-11-02 DIAGNOSIS — M545 Low back pain: Secondary | ICD-10-CM | POA: Diagnosis not present

## 2016-11-02 DIAGNOSIS — R296 Repeated falls: Secondary | ICD-10-CM | POA: Diagnosis not present

## 2016-11-07 DIAGNOSIS — I87322 Chronic venous hypertension (idiopathic) with inflammation of left lower extremity: Secondary | ICD-10-CM | POA: Diagnosis not present

## 2016-11-07 DIAGNOSIS — G4730 Sleep apnea, unspecified: Secondary | ICD-10-CM | POA: Diagnosis not present

## 2016-11-07 DIAGNOSIS — I890 Lymphedema, not elsewhere classified: Secondary | ICD-10-CM | POA: Diagnosis not present

## 2016-11-07 DIAGNOSIS — M545 Low back pain: Secondary | ICD-10-CM | POA: Diagnosis not present

## 2016-11-07 DIAGNOSIS — M5416 Radiculopathy, lumbar region: Secondary | ICD-10-CM | POA: Diagnosis not present

## 2016-11-07 DIAGNOSIS — I87312 Chronic venous hypertension (idiopathic) with ulcer of left lower extremity: Secondary | ICD-10-CM | POA: Diagnosis not present

## 2016-11-07 DIAGNOSIS — L97829 Non-pressure chronic ulcer of other part of left lower leg with unspecified severity: Secondary | ICD-10-CM | POA: Diagnosis not present

## 2016-11-07 DIAGNOSIS — L97822 Non-pressure chronic ulcer of other part of left lower leg with fat layer exposed: Secondary | ICD-10-CM | POA: Diagnosis not present

## 2016-11-09 DIAGNOSIS — M5416 Radiculopathy, lumbar region: Secondary | ICD-10-CM | POA: Diagnosis not present

## 2016-11-09 DIAGNOSIS — M545 Low back pain: Secondary | ICD-10-CM | POA: Diagnosis not present

## 2016-11-14 DIAGNOSIS — L97829 Non-pressure chronic ulcer of other part of left lower leg with unspecified severity: Secondary | ICD-10-CM | POA: Diagnosis not present

## 2016-11-14 DIAGNOSIS — M5441 Lumbago with sciatica, right side: Secondary | ICD-10-CM | POA: Diagnosis not present

## 2016-11-14 DIAGNOSIS — I87312 Chronic venous hypertension (idiopathic) with ulcer of left lower extremity: Secondary | ICD-10-CM | POA: Diagnosis not present

## 2016-11-14 DIAGNOSIS — G4730 Sleep apnea, unspecified: Secondary | ICD-10-CM | POA: Diagnosis not present

## 2016-11-14 DIAGNOSIS — M545 Low back pain: Secondary | ICD-10-CM | POA: Diagnosis not present

## 2016-11-14 DIAGNOSIS — I87322 Chronic venous hypertension (idiopathic) with inflammation of left lower extremity: Secondary | ICD-10-CM | POA: Diagnosis not present

## 2016-11-14 DIAGNOSIS — I890 Lymphedema, not elsewhere classified: Secondary | ICD-10-CM | POA: Diagnosis not present

## 2016-11-14 DIAGNOSIS — L97822 Non-pressure chronic ulcer of other part of left lower leg with fat layer exposed: Secondary | ICD-10-CM | POA: Diagnosis not present

## 2016-11-14 DIAGNOSIS — M5416 Radiculopathy, lumbar region: Secondary | ICD-10-CM | POA: Diagnosis not present

## 2016-11-16 DIAGNOSIS — M5489 Other dorsalgia: Secondary | ICD-10-CM | POA: Diagnosis not present

## 2016-11-16 DIAGNOSIS — M5441 Lumbago with sciatica, right side: Secondary | ICD-10-CM | POA: Diagnosis not present

## 2016-11-20 DIAGNOSIS — T1490XA Injury, unspecified, initial encounter: Secondary | ICD-10-CM | POA: Diagnosis not present

## 2016-11-21 ENCOUNTER — Encounter (HOSPITAL_BASED_OUTPATIENT_CLINIC_OR_DEPARTMENT_OTHER): Payer: Medicare Other | Attending: Surgery

## 2016-11-21 DIAGNOSIS — M5489 Other dorsalgia: Secondary | ICD-10-CM | POA: Diagnosis not present

## 2016-11-21 DIAGNOSIS — Z8673 Personal history of transient ischemic attack (TIA), and cerebral infarction without residual deficits: Secondary | ICD-10-CM | POA: Insufficient documentation

## 2016-11-21 DIAGNOSIS — L97821 Non-pressure chronic ulcer of other part of left lower leg limited to breakdown of skin: Secondary | ICD-10-CM | POA: Insufficient documentation

## 2016-11-21 DIAGNOSIS — M25551 Pain in right hip: Secondary | ICD-10-CM | POA: Diagnosis not present

## 2016-11-21 DIAGNOSIS — I890 Lymphedema, not elsewhere classified: Secondary | ICD-10-CM | POA: Diagnosis not present

## 2016-11-21 DIAGNOSIS — G4730 Sleep apnea, unspecified: Secondary | ICD-10-CM | POA: Insufficient documentation

## 2016-11-21 DIAGNOSIS — I10 Essential (primary) hypertension: Secondary | ICD-10-CM | POA: Insufficient documentation

## 2016-11-21 DIAGNOSIS — Z6841 Body Mass Index (BMI) 40.0 and over, adult: Secondary | ICD-10-CM | POA: Insufficient documentation

## 2016-11-21 DIAGNOSIS — I87332 Chronic venous hypertension (idiopathic) with ulcer and inflammation of left lower extremity: Secondary | ICD-10-CM | POA: Insufficient documentation

## 2016-11-21 DIAGNOSIS — M5441 Lumbago with sciatica, right side: Secondary | ICD-10-CM | POA: Diagnosis not present

## 2016-11-21 DIAGNOSIS — I87312 Chronic venous hypertension (idiopathic) with ulcer of left lower extremity: Secondary | ICD-10-CM | POA: Diagnosis not present

## 2016-11-22 DIAGNOSIS — T1490XA Injury, unspecified, initial encounter: Secondary | ICD-10-CM | POA: Diagnosis not present

## 2016-11-23 DIAGNOSIS — I5033 Acute on chronic diastolic (congestive) heart failure: Secondary | ICD-10-CM | POA: Diagnosis not present

## 2016-11-23 DIAGNOSIS — M5441 Lumbago with sciatica, right side: Secondary | ICD-10-CM | POA: Diagnosis not present

## 2016-11-23 DIAGNOSIS — I10 Essential (primary) hypertension: Secondary | ICD-10-CM | POA: Diagnosis not present

## 2016-11-23 DIAGNOSIS — E1121 Type 2 diabetes mellitus with diabetic nephropathy: Secondary | ICD-10-CM | POA: Diagnosis not present

## 2016-11-23 DIAGNOSIS — E1129 Type 2 diabetes mellitus with other diabetic kidney complication: Secondary | ICD-10-CM | POA: Diagnosis not present

## 2016-11-23 DIAGNOSIS — G4733 Obstructive sleep apnea (adult) (pediatric): Secondary | ICD-10-CM | POA: Diagnosis not present

## 2016-11-23 DIAGNOSIS — M5489 Other dorsalgia: Secondary | ICD-10-CM | POA: Diagnosis not present

## 2016-11-23 DIAGNOSIS — E7800 Pure hypercholesterolemia, unspecified: Secondary | ICD-10-CM | POA: Diagnosis not present

## 2016-11-28 DIAGNOSIS — L97821 Non-pressure chronic ulcer of other part of left lower leg limited to breakdown of skin: Secondary | ICD-10-CM | POA: Diagnosis not present

## 2016-11-28 DIAGNOSIS — G4730 Sleep apnea, unspecified: Secondary | ICD-10-CM | POA: Diagnosis not present

## 2016-11-28 DIAGNOSIS — I87332 Chronic venous hypertension (idiopathic) with ulcer and inflammation of left lower extremity: Secondary | ICD-10-CM | POA: Diagnosis not present

## 2016-11-28 DIAGNOSIS — I890 Lymphedema, not elsewhere classified: Secondary | ICD-10-CM | POA: Diagnosis not present

## 2016-11-28 DIAGNOSIS — I10 Essential (primary) hypertension: Secondary | ICD-10-CM | POA: Diagnosis not present

## 2016-11-29 DIAGNOSIS — M5489 Other dorsalgia: Secondary | ICD-10-CM | POA: Diagnosis not present

## 2016-11-29 DIAGNOSIS — M5441 Lumbago with sciatica, right side: Secondary | ICD-10-CM | POA: Diagnosis not present

## 2016-11-29 DIAGNOSIS — T1490XA Injury, unspecified, initial encounter: Secondary | ICD-10-CM | POA: Diagnosis not present

## 2016-11-30 DIAGNOSIS — G4733 Obstructive sleep apnea (adult) (pediatric): Secondary | ICD-10-CM | POA: Diagnosis not present

## 2016-11-30 DIAGNOSIS — Z1389 Encounter for screening for other disorder: Secondary | ICD-10-CM | POA: Diagnosis not present

## 2016-11-30 DIAGNOSIS — E1121 Type 2 diabetes mellitus with diabetic nephropathy: Secondary | ICD-10-CM | POA: Diagnosis not present

## 2016-11-30 DIAGNOSIS — R296 Repeated falls: Secondary | ICD-10-CM | POA: Diagnosis not present

## 2016-11-30 DIAGNOSIS — M5441 Lumbago with sciatica, right side: Secondary | ICD-10-CM | POA: Diagnosis not present

## 2016-11-30 DIAGNOSIS — E1129 Type 2 diabetes mellitus with other diabetic kidney complication: Secondary | ICD-10-CM | POA: Diagnosis not present

## 2016-11-30 DIAGNOSIS — E7800 Pure hypercholesterolemia, unspecified: Secondary | ICD-10-CM | POA: Diagnosis not present

## 2016-11-30 DIAGNOSIS — I10 Essential (primary) hypertension: Secondary | ICD-10-CM | POA: Diagnosis not present

## 2016-11-30 DIAGNOSIS — M5489 Other dorsalgia: Secondary | ICD-10-CM | POA: Diagnosis not present

## 2016-12-04 DIAGNOSIS — M5441 Lumbago with sciatica, right side: Secondary | ICD-10-CM | POA: Diagnosis not present

## 2016-12-04 DIAGNOSIS — M5489 Other dorsalgia: Secondary | ICD-10-CM | POA: Diagnosis not present

## 2016-12-05 DIAGNOSIS — G4730 Sleep apnea, unspecified: Secondary | ICD-10-CM | POA: Diagnosis not present

## 2016-12-05 DIAGNOSIS — L97221 Non-pressure chronic ulcer of left calf limited to breakdown of skin: Secondary | ICD-10-CM | POA: Diagnosis not present

## 2016-12-05 DIAGNOSIS — I890 Lymphedema, not elsewhere classified: Secondary | ICD-10-CM | POA: Diagnosis not present

## 2016-12-05 DIAGNOSIS — I10 Essential (primary) hypertension: Secondary | ICD-10-CM | POA: Diagnosis not present

## 2016-12-05 DIAGNOSIS — I87332 Chronic venous hypertension (idiopathic) with ulcer and inflammation of left lower extremity: Secondary | ICD-10-CM | POA: Diagnosis not present

## 2016-12-05 DIAGNOSIS — L97821 Non-pressure chronic ulcer of other part of left lower leg limited to breakdown of skin: Secondary | ICD-10-CM | POA: Diagnosis not present

## 2016-12-07 DIAGNOSIS — M5441 Lumbago with sciatica, right side: Secondary | ICD-10-CM | POA: Diagnosis not present

## 2016-12-07 DIAGNOSIS — M5489 Other dorsalgia: Secondary | ICD-10-CM | POA: Diagnosis not present

## 2016-12-07 DIAGNOSIS — T1490XA Injury, unspecified, initial encounter: Secondary | ICD-10-CM | POA: Diagnosis not present

## 2016-12-12 DIAGNOSIS — M5489 Other dorsalgia: Secondary | ICD-10-CM | POA: Diagnosis not present

## 2016-12-12 DIAGNOSIS — L97821 Non-pressure chronic ulcer of other part of left lower leg limited to breakdown of skin: Secondary | ICD-10-CM | POA: Diagnosis not present

## 2016-12-12 DIAGNOSIS — L97221 Non-pressure chronic ulcer of left calf limited to breakdown of skin: Secondary | ICD-10-CM | POA: Diagnosis not present

## 2016-12-12 DIAGNOSIS — G4730 Sleep apnea, unspecified: Secondary | ICD-10-CM | POA: Diagnosis not present

## 2016-12-12 DIAGNOSIS — I87332 Chronic venous hypertension (idiopathic) with ulcer and inflammation of left lower extremity: Secondary | ICD-10-CM | POA: Diagnosis not present

## 2016-12-12 DIAGNOSIS — I10 Essential (primary) hypertension: Secondary | ICD-10-CM | POA: Diagnosis not present

## 2016-12-12 DIAGNOSIS — I890 Lymphedema, not elsewhere classified: Secondary | ICD-10-CM | POA: Diagnosis not present

## 2016-12-12 DIAGNOSIS — M5441 Lumbago with sciatica, right side: Secondary | ICD-10-CM | POA: Diagnosis not present

## 2016-12-13 ENCOUNTER — Other Ambulatory Visit: Payer: Self-pay

## 2016-12-13 DIAGNOSIS — M5441 Lumbago with sciatica, right side: Secondary | ICD-10-CM

## 2016-12-14 DIAGNOSIS — M5441 Lumbago with sciatica, right side: Secondary | ICD-10-CM | POA: Diagnosis not present

## 2016-12-14 DIAGNOSIS — M5489 Other dorsalgia: Secondary | ICD-10-CM | POA: Diagnosis not present

## 2016-12-19 ENCOUNTER — Encounter (HOSPITAL_BASED_OUTPATIENT_CLINIC_OR_DEPARTMENT_OTHER): Payer: Medicare Other | Attending: Surgery

## 2016-12-19 DIAGNOSIS — I10 Essential (primary) hypertension: Secondary | ICD-10-CM | POA: Diagnosis not present

## 2016-12-19 DIAGNOSIS — I87332 Chronic venous hypertension (idiopathic) with ulcer and inflammation of left lower extremity: Secondary | ICD-10-CM | POA: Diagnosis not present

## 2016-12-19 DIAGNOSIS — L97821 Non-pressure chronic ulcer of other part of left lower leg limited to breakdown of skin: Secondary | ICD-10-CM | POA: Insufficient documentation

## 2016-12-19 DIAGNOSIS — I87333 Chronic venous hypertension (idiopathic) with ulcer and inflammation of bilateral lower extremity: Secondary | ICD-10-CM | POA: Diagnosis not present

## 2016-12-19 DIAGNOSIS — I890 Lymphedema, not elsewhere classified: Secondary | ICD-10-CM | POA: Diagnosis not present

## 2016-12-19 DIAGNOSIS — G4730 Sleep apnea, unspecified: Secondary | ICD-10-CM | POA: Insufficient documentation

## 2016-12-19 DIAGNOSIS — Z6841 Body Mass Index (BMI) 40.0 and over, adult: Secondary | ICD-10-CM | POA: Diagnosis not present

## 2016-12-19 DIAGNOSIS — L97221 Non-pressure chronic ulcer of left calf limited to breakdown of skin: Secondary | ICD-10-CM | POA: Diagnosis not present

## 2016-12-20 DIAGNOSIS — Z79899 Other long term (current) drug therapy: Secondary | ICD-10-CM | POA: Diagnosis not present

## 2016-12-20 DIAGNOSIS — W1839XA Other fall on same level, initial encounter: Secondary | ICD-10-CM | POA: Diagnosis not present

## 2016-12-20 DIAGNOSIS — Z7902 Long term (current) use of antithrombotics/antiplatelets: Secondary | ICD-10-CM | POA: Diagnosis not present

## 2016-12-20 DIAGNOSIS — M25551 Pain in right hip: Secondary | ICD-10-CM | POA: Diagnosis not present

## 2016-12-20 DIAGNOSIS — M160 Bilateral primary osteoarthritis of hip: Secondary | ICD-10-CM | POA: Diagnosis not present

## 2016-12-20 DIAGNOSIS — M79604 Pain in right leg: Secondary | ICD-10-CM | POA: Diagnosis not present

## 2016-12-20 DIAGNOSIS — E7800 Pure hypercholesterolemia, unspecified: Secondary | ICD-10-CM | POA: Diagnosis not present

## 2016-12-20 DIAGNOSIS — I739 Peripheral vascular disease, unspecified: Secondary | ICD-10-CM | POA: Diagnosis not present

## 2016-12-20 DIAGNOSIS — I10 Essential (primary) hypertension: Secondary | ICD-10-CM | POA: Diagnosis not present

## 2016-12-20 DIAGNOSIS — S7001XA Contusion of right hip, initial encounter: Secondary | ICD-10-CM | POA: Diagnosis not present

## 2016-12-20 DIAGNOSIS — Z7982 Long term (current) use of aspirin: Secondary | ICD-10-CM | POA: Diagnosis not present

## 2016-12-20 DIAGNOSIS — S79911A Unspecified injury of right hip, initial encounter: Secondary | ICD-10-CM | POA: Diagnosis not present

## 2016-12-20 DIAGNOSIS — R52 Pain, unspecified: Secondary | ICD-10-CM | POA: Diagnosis not present

## 2016-12-26 DIAGNOSIS — L97221 Non-pressure chronic ulcer of left calf limited to breakdown of skin: Secondary | ICD-10-CM | POA: Diagnosis not present

## 2016-12-26 DIAGNOSIS — I10 Essential (primary) hypertension: Secondary | ICD-10-CM | POA: Diagnosis not present

## 2016-12-26 DIAGNOSIS — I87332 Chronic venous hypertension (idiopathic) with ulcer and inflammation of left lower extremity: Secondary | ICD-10-CM | POA: Diagnosis not present

## 2016-12-26 DIAGNOSIS — I890 Lymphedema, not elsewhere classified: Secondary | ICD-10-CM | POA: Diagnosis not present

## 2016-12-26 DIAGNOSIS — I87333 Chronic venous hypertension (idiopathic) with ulcer and inflammation of bilateral lower extremity: Secondary | ICD-10-CM | POA: Diagnosis not present

## 2016-12-26 DIAGNOSIS — G4730 Sleep apnea, unspecified: Secondary | ICD-10-CM | POA: Diagnosis not present

## 2016-12-26 DIAGNOSIS — L97821 Non-pressure chronic ulcer of other part of left lower leg limited to breakdown of skin: Secondary | ICD-10-CM | POA: Diagnosis not present

## 2016-12-30 ENCOUNTER — Ambulatory Visit
Admission: RE | Admit: 2016-12-30 | Discharge: 2016-12-30 | Disposition: A | Discharge status: Home / Self Care | Payer: Medicare Other | Source: Ambulatory Visit | Attending: Orthopedic Surgery | Admitting: Orthopedic Surgery

## 2016-12-30 DIAGNOSIS — M5441 Lumbago with sciatica, right side: Secondary | ICD-10-CM

## 2016-12-30 DIAGNOSIS — R531 Weakness: Secondary | ICD-10-CM | POA: Diagnosis not present

## 2017-01-02 DIAGNOSIS — I890 Lymphedema, not elsewhere classified: Secondary | ICD-10-CM | POA: Diagnosis not present

## 2017-01-02 DIAGNOSIS — L97821 Non-pressure chronic ulcer of other part of left lower leg limited to breakdown of skin: Secondary | ICD-10-CM | POA: Diagnosis not present

## 2017-01-02 DIAGNOSIS — I10 Essential (primary) hypertension: Secondary | ICD-10-CM | POA: Diagnosis not present

## 2017-01-02 DIAGNOSIS — I87312 Chronic venous hypertension (idiopathic) with ulcer of left lower extremity: Secondary | ICD-10-CM | POA: Diagnosis not present

## 2017-01-02 DIAGNOSIS — I87333 Chronic venous hypertension (idiopathic) with ulcer and inflammation of bilateral lower extremity: Secondary | ICD-10-CM | POA: Diagnosis not present

## 2017-01-02 DIAGNOSIS — G4730 Sleep apnea, unspecified: Secondary | ICD-10-CM | POA: Diagnosis not present

## 2017-01-02 DIAGNOSIS — M5441 Lumbago with sciatica, right side: Secondary | ICD-10-CM | POA: Diagnosis not present

## 2017-01-02 DIAGNOSIS — L97221 Non-pressure chronic ulcer of left calf limited to breakdown of skin: Secondary | ICD-10-CM | POA: Diagnosis not present

## 2017-01-05 DIAGNOSIS — M48062 Spinal stenosis, lumbar region with neurogenic claudication: Secondary | ICD-10-CM | POA: Diagnosis not present

## 2017-01-09 DIAGNOSIS — G4730 Sleep apnea, unspecified: Secondary | ICD-10-CM | POA: Diagnosis not present

## 2017-01-09 DIAGNOSIS — L97221 Non-pressure chronic ulcer of left calf limited to breakdown of skin: Secondary | ICD-10-CM | POA: Diagnosis not present

## 2017-01-09 DIAGNOSIS — I87312 Chronic venous hypertension (idiopathic) with ulcer of left lower extremity: Secondary | ICD-10-CM | POA: Diagnosis not present

## 2017-01-09 DIAGNOSIS — I87333 Chronic venous hypertension (idiopathic) with ulcer and inflammation of bilateral lower extremity: Secondary | ICD-10-CM | POA: Diagnosis not present

## 2017-01-09 DIAGNOSIS — L97821 Non-pressure chronic ulcer of other part of left lower leg limited to breakdown of skin: Secondary | ICD-10-CM | POA: Diagnosis not present

## 2017-01-09 DIAGNOSIS — I890 Lymphedema, not elsewhere classified: Secondary | ICD-10-CM | POA: Diagnosis not present

## 2017-01-09 DIAGNOSIS — I10 Essential (primary) hypertension: Secondary | ICD-10-CM | POA: Diagnosis not present

## 2017-01-14 DIAGNOSIS — T1490XA Injury, unspecified, initial encounter: Secondary | ICD-10-CM | POA: Diagnosis not present

## 2017-01-16 ENCOUNTER — Encounter (HOSPITAL_BASED_OUTPATIENT_CLINIC_OR_DEPARTMENT_OTHER): Payer: Medicare Other | Attending: Surgery

## 2017-01-16 DIAGNOSIS — I890 Lymphedema, not elsewhere classified: Secondary | ICD-10-CM | POA: Diagnosis not present

## 2017-01-16 DIAGNOSIS — I87333 Chronic venous hypertension (idiopathic) with ulcer and inflammation of bilateral lower extremity: Secondary | ICD-10-CM | POA: Diagnosis not present

## 2017-01-16 DIAGNOSIS — I10 Essential (primary) hypertension: Secondary | ICD-10-CM | POA: Diagnosis not present

## 2017-01-16 DIAGNOSIS — I87332 Chronic venous hypertension (idiopathic) with ulcer and inflammation of left lower extremity: Secondary | ICD-10-CM | POA: Diagnosis not present

## 2017-01-16 DIAGNOSIS — L97221 Non-pressure chronic ulcer of left calf limited to breakdown of skin: Secondary | ICD-10-CM | POA: Diagnosis not present

## 2017-01-16 DIAGNOSIS — G4730 Sleep apnea, unspecified: Secondary | ICD-10-CM | POA: Insufficient documentation

## 2017-01-16 DIAGNOSIS — L97821 Non-pressure chronic ulcer of other part of left lower leg limited to breakdown of skin: Secondary | ICD-10-CM | POA: Diagnosis not present

## 2017-01-17 ENCOUNTER — Encounter (HOSPITAL_BASED_OUTPATIENT_CLINIC_OR_DEPARTMENT_OTHER): Payer: Medicare Other

## 2017-01-18 DIAGNOSIS — R69 Illness, unspecified: Secondary | ICD-10-CM | POA: Diagnosis not present

## 2017-01-23 DIAGNOSIS — G4730 Sleep apnea, unspecified: Secondary | ICD-10-CM | POA: Diagnosis not present

## 2017-01-23 DIAGNOSIS — I10 Essential (primary) hypertension: Secondary | ICD-10-CM | POA: Diagnosis not present

## 2017-01-23 DIAGNOSIS — I890 Lymphedema, not elsewhere classified: Secondary | ICD-10-CM | POA: Diagnosis not present

## 2017-01-23 DIAGNOSIS — L97221 Non-pressure chronic ulcer of left calf limited to breakdown of skin: Secondary | ICD-10-CM | POA: Diagnosis not present

## 2017-01-23 DIAGNOSIS — I87332 Chronic venous hypertension (idiopathic) with ulcer and inflammation of left lower extremity: Secondary | ICD-10-CM | POA: Diagnosis not present

## 2017-01-23 DIAGNOSIS — L97821 Non-pressure chronic ulcer of other part of left lower leg limited to breakdown of skin: Secondary | ICD-10-CM | POA: Diagnosis not present

## 2017-01-23 DIAGNOSIS — I87333 Chronic venous hypertension (idiopathic) with ulcer and inflammation of bilateral lower extremity: Secondary | ICD-10-CM | POA: Diagnosis not present

## 2017-01-30 DIAGNOSIS — I10 Essential (primary) hypertension: Secondary | ICD-10-CM | POA: Diagnosis not present

## 2017-01-30 DIAGNOSIS — L97821 Non-pressure chronic ulcer of other part of left lower leg limited to breakdown of skin: Secondary | ICD-10-CM | POA: Diagnosis not present

## 2017-01-30 DIAGNOSIS — G4730 Sleep apnea, unspecified: Secondary | ICD-10-CM | POA: Diagnosis not present

## 2017-01-30 DIAGNOSIS — I87332 Chronic venous hypertension (idiopathic) with ulcer and inflammation of left lower extremity: Secondary | ICD-10-CM | POA: Diagnosis not present

## 2017-01-30 DIAGNOSIS — I890 Lymphedema, not elsewhere classified: Secondary | ICD-10-CM | POA: Diagnosis not present

## 2017-01-30 DIAGNOSIS — L97221 Non-pressure chronic ulcer of left calf limited to breakdown of skin: Secondary | ICD-10-CM | POA: Diagnosis not present

## 2017-01-30 DIAGNOSIS — I87333 Chronic venous hypertension (idiopathic) with ulcer and inflammation of bilateral lower extremity: Secondary | ICD-10-CM | POA: Diagnosis not present

## 2017-01-30 DIAGNOSIS — M48062 Spinal stenosis, lumbar region with neurogenic claudication: Secondary | ICD-10-CM | POA: Diagnosis not present

## 2017-02-07 DIAGNOSIS — L97821 Non-pressure chronic ulcer of other part of left lower leg limited to breakdown of skin: Secondary | ICD-10-CM | POA: Diagnosis not present

## 2017-02-07 DIAGNOSIS — G4730 Sleep apnea, unspecified: Secondary | ICD-10-CM | POA: Diagnosis not present

## 2017-02-07 DIAGNOSIS — I87333 Chronic venous hypertension (idiopathic) with ulcer and inflammation of bilateral lower extremity: Secondary | ICD-10-CM | POA: Diagnosis not present

## 2017-02-07 DIAGNOSIS — I10 Essential (primary) hypertension: Secondary | ICD-10-CM | POA: Diagnosis not present

## 2017-02-07 DIAGNOSIS — I890 Lymphedema, not elsewhere classified: Secondary | ICD-10-CM | POA: Diagnosis not present

## 2017-02-07 DIAGNOSIS — I87332 Chronic venous hypertension (idiopathic) with ulcer and inflammation of left lower extremity: Secondary | ICD-10-CM | POA: Diagnosis not present

## 2017-02-07 DIAGNOSIS — L97221 Non-pressure chronic ulcer of left calf limited to breakdown of skin: Secondary | ICD-10-CM | POA: Diagnosis not present

## 2017-02-13 DIAGNOSIS — L97229 Non-pressure chronic ulcer of left calf with unspecified severity: Secondary | ICD-10-CM | POA: Diagnosis not present

## 2017-02-13 DIAGNOSIS — M48062 Spinal stenosis, lumbar region with neurogenic claudication: Secondary | ICD-10-CM | POA: Diagnosis not present

## 2017-02-13 DIAGNOSIS — G4730 Sleep apnea, unspecified: Secondary | ICD-10-CM | POA: Diagnosis not present

## 2017-02-13 DIAGNOSIS — I87332 Chronic venous hypertension (idiopathic) with ulcer and inflammation of left lower extremity: Secondary | ICD-10-CM | POA: Diagnosis not present

## 2017-02-13 DIAGNOSIS — L97821 Non-pressure chronic ulcer of other part of left lower leg limited to breakdown of skin: Secondary | ICD-10-CM | POA: Diagnosis not present

## 2017-02-13 DIAGNOSIS — I890 Lymphedema, not elsewhere classified: Secondary | ICD-10-CM | POA: Diagnosis not present

## 2017-02-13 DIAGNOSIS — I87333 Chronic venous hypertension (idiopathic) with ulcer and inflammation of bilateral lower extremity: Secondary | ICD-10-CM | POA: Diagnosis not present

## 2017-02-13 DIAGNOSIS — I10 Essential (primary) hypertension: Secondary | ICD-10-CM | POA: Diagnosis not present

## 2017-02-20 ENCOUNTER — Encounter (HOSPITAL_BASED_OUTPATIENT_CLINIC_OR_DEPARTMENT_OTHER): Payer: Medicare Other | Attending: Surgery

## 2017-02-20 DIAGNOSIS — L97829 Non-pressure chronic ulcer of other part of left lower leg with unspecified severity: Secondary | ICD-10-CM | POA: Diagnosis not present

## 2017-02-20 DIAGNOSIS — L97819 Non-pressure chronic ulcer of other part of right lower leg with unspecified severity: Secondary | ICD-10-CM | POA: Insufficient documentation

## 2017-02-20 DIAGNOSIS — L97222 Non-pressure chronic ulcer of left calf with fat layer exposed: Secondary | ICD-10-CM | POA: Diagnosis not present

## 2017-02-20 DIAGNOSIS — Z6841 Body Mass Index (BMI) 40.0 and over, adult: Secondary | ICD-10-CM | POA: Diagnosis not present

## 2017-02-20 DIAGNOSIS — L97821 Non-pressure chronic ulcer of other part of left lower leg limited to breakdown of skin: Secondary | ICD-10-CM | POA: Diagnosis not present

## 2017-02-20 DIAGNOSIS — I87332 Chronic venous hypertension (idiopathic) with ulcer and inflammation of left lower extremity: Secondary | ICD-10-CM | POA: Insufficient documentation

## 2017-02-20 DIAGNOSIS — I890 Lymphedema, not elsewhere classified: Secondary | ICD-10-CM | POA: Insufficient documentation

## 2017-02-20 DIAGNOSIS — L97822 Non-pressure chronic ulcer of other part of left lower leg with fat layer exposed: Secondary | ICD-10-CM | POA: Diagnosis not present

## 2017-02-20 DIAGNOSIS — G4730 Sleep apnea, unspecified: Secondary | ICD-10-CM | POA: Insufficient documentation

## 2017-02-27 DIAGNOSIS — L97822 Non-pressure chronic ulcer of other part of left lower leg with fat layer exposed: Secondary | ICD-10-CM | POA: Diagnosis not present

## 2017-02-27 DIAGNOSIS — I87313 Chronic venous hypertension (idiopathic) with ulcer of bilateral lower extremity: Secondary | ICD-10-CM | POA: Diagnosis not present

## 2017-02-27 DIAGNOSIS — L97821 Non-pressure chronic ulcer of other part of left lower leg limited to breakdown of skin: Secondary | ICD-10-CM | POA: Diagnosis not present

## 2017-02-27 DIAGNOSIS — L97829 Non-pressure chronic ulcer of other part of left lower leg with unspecified severity: Secondary | ICD-10-CM | POA: Diagnosis not present

## 2017-02-27 DIAGNOSIS — L97222 Non-pressure chronic ulcer of left calf with fat layer exposed: Secondary | ICD-10-CM | POA: Diagnosis not present

## 2017-02-27 DIAGNOSIS — I87332 Chronic venous hypertension (idiopathic) with ulcer and inflammation of left lower extremity: Secondary | ICD-10-CM | POA: Diagnosis not present

## 2017-02-27 DIAGNOSIS — L97819 Non-pressure chronic ulcer of other part of right lower leg with unspecified severity: Secondary | ICD-10-CM | POA: Diagnosis not present

## 2017-02-27 DIAGNOSIS — I890 Lymphedema, not elsewhere classified: Secondary | ICD-10-CM | POA: Diagnosis not present

## 2017-03-06 DIAGNOSIS — I890 Lymphedema, not elsewhere classified: Secondary | ICD-10-CM | POA: Diagnosis not present

## 2017-03-06 DIAGNOSIS — L97822 Non-pressure chronic ulcer of other part of left lower leg with fat layer exposed: Secondary | ICD-10-CM | POA: Diagnosis not present

## 2017-03-06 DIAGNOSIS — L97821 Non-pressure chronic ulcer of other part of left lower leg limited to breakdown of skin: Secondary | ICD-10-CM | POA: Diagnosis not present

## 2017-03-06 DIAGNOSIS — I87332 Chronic venous hypertension (idiopathic) with ulcer and inflammation of left lower extremity: Secondary | ICD-10-CM | POA: Diagnosis not present

## 2017-03-06 DIAGNOSIS — I87313 Chronic venous hypertension (idiopathic) with ulcer of bilateral lower extremity: Secondary | ICD-10-CM | POA: Diagnosis not present

## 2017-03-06 DIAGNOSIS — L97819 Non-pressure chronic ulcer of other part of right lower leg with unspecified severity: Secondary | ICD-10-CM | POA: Diagnosis not present

## 2017-03-06 DIAGNOSIS — L97829 Non-pressure chronic ulcer of other part of left lower leg with unspecified severity: Secondary | ICD-10-CM | POA: Diagnosis not present

## 2017-03-06 DIAGNOSIS — L97222 Non-pressure chronic ulcer of left calf with fat layer exposed: Secondary | ICD-10-CM | POA: Diagnosis not present

## 2017-03-13 DIAGNOSIS — L97812 Non-pressure chronic ulcer of other part of right lower leg with fat layer exposed: Secondary | ICD-10-CM | POA: Diagnosis not present

## 2017-03-13 DIAGNOSIS — L97822 Non-pressure chronic ulcer of other part of left lower leg with fat layer exposed: Secondary | ICD-10-CM | POA: Diagnosis not present

## 2017-03-13 DIAGNOSIS — I87332 Chronic venous hypertension (idiopathic) with ulcer and inflammation of left lower extremity: Secondary | ICD-10-CM | POA: Diagnosis not present

## 2017-03-13 DIAGNOSIS — L97829 Non-pressure chronic ulcer of other part of left lower leg with unspecified severity: Secondary | ICD-10-CM | POA: Diagnosis not present

## 2017-03-13 DIAGNOSIS — I87333 Chronic venous hypertension (idiopathic) with ulcer and inflammation of bilateral lower extremity: Secondary | ICD-10-CM | POA: Diagnosis not present

## 2017-03-13 DIAGNOSIS — L97222 Non-pressure chronic ulcer of left calf with fat layer exposed: Secondary | ICD-10-CM | POA: Diagnosis not present

## 2017-03-13 DIAGNOSIS — L97819 Non-pressure chronic ulcer of other part of right lower leg with unspecified severity: Secondary | ICD-10-CM | POA: Diagnosis not present

## 2017-03-13 DIAGNOSIS — I890 Lymphedema, not elsewhere classified: Secondary | ICD-10-CM | POA: Diagnosis not present

## 2017-03-13 DIAGNOSIS — L97821 Non-pressure chronic ulcer of other part of left lower leg limited to breakdown of skin: Secondary | ICD-10-CM | POA: Diagnosis not present

## 2017-03-20 ENCOUNTER — Encounter (HOSPITAL_BASED_OUTPATIENT_CLINIC_OR_DEPARTMENT_OTHER): Payer: Medicare Other | Attending: Surgery

## 2017-03-20 DIAGNOSIS — G4730 Sleep apnea, unspecified: Secondary | ICD-10-CM | POA: Insufficient documentation

## 2017-03-20 DIAGNOSIS — L97819 Non-pressure chronic ulcer of other part of right lower leg with unspecified severity: Secondary | ICD-10-CM | POA: Diagnosis not present

## 2017-03-20 DIAGNOSIS — L97821 Non-pressure chronic ulcer of other part of left lower leg limited to breakdown of skin: Secondary | ICD-10-CM | POA: Insufficient documentation

## 2017-03-20 DIAGNOSIS — I509 Heart failure, unspecified: Secondary | ICD-10-CM | POA: Insufficient documentation

## 2017-03-20 DIAGNOSIS — L97222 Non-pressure chronic ulcer of left calf with fat layer exposed: Secondary | ICD-10-CM | POA: Insufficient documentation

## 2017-03-20 DIAGNOSIS — L97212 Non-pressure chronic ulcer of right calf with fat layer exposed: Secondary | ICD-10-CM | POA: Diagnosis not present

## 2017-03-20 DIAGNOSIS — I87333 Chronic venous hypertension (idiopathic) with ulcer and inflammation of bilateral lower extremity: Secondary | ICD-10-CM | POA: Insufficient documentation

## 2017-03-20 DIAGNOSIS — Z8673 Personal history of transient ischemic attack (TIA), and cerebral infarction without residual deficits: Secondary | ICD-10-CM | POA: Diagnosis not present

## 2017-03-20 DIAGNOSIS — I110 Hypertensive heart disease with heart failure: Secondary | ICD-10-CM | POA: Insufficient documentation

## 2017-03-20 DIAGNOSIS — Z6841 Body Mass Index (BMI) 40.0 and over, adult: Secondary | ICD-10-CM | POA: Insufficient documentation

## 2017-03-20 DIAGNOSIS — L97229 Non-pressure chronic ulcer of left calf with unspecified severity: Secondary | ICD-10-CM | POA: Diagnosis not present

## 2017-03-20 DIAGNOSIS — L97829 Non-pressure chronic ulcer of other part of left lower leg with unspecified severity: Secondary | ICD-10-CM | POA: Diagnosis not present

## 2017-03-20 DIAGNOSIS — I890 Lymphedema, not elsewhere classified: Secondary | ICD-10-CM | POA: Insufficient documentation

## 2017-03-23 DIAGNOSIS — M48062 Spinal stenosis, lumbar region with neurogenic claudication: Secondary | ICD-10-CM | POA: Diagnosis not present

## 2017-03-27 DIAGNOSIS — L97829 Non-pressure chronic ulcer of other part of left lower leg with unspecified severity: Secondary | ICD-10-CM | POA: Diagnosis not present

## 2017-03-27 DIAGNOSIS — L97212 Non-pressure chronic ulcer of right calf with fat layer exposed: Secondary | ICD-10-CM | POA: Diagnosis not present

## 2017-03-27 DIAGNOSIS — I87313 Chronic venous hypertension (idiopathic) with ulcer of bilateral lower extremity: Secondary | ICD-10-CM | POA: Diagnosis not present

## 2017-03-27 DIAGNOSIS — L97819 Non-pressure chronic ulcer of other part of right lower leg with unspecified severity: Secondary | ICD-10-CM | POA: Diagnosis not present

## 2017-03-27 DIAGNOSIS — L97822 Non-pressure chronic ulcer of other part of left lower leg with fat layer exposed: Secondary | ICD-10-CM | POA: Diagnosis not present

## 2017-03-27 DIAGNOSIS — I87333 Chronic venous hypertension (idiopathic) with ulcer and inflammation of bilateral lower extremity: Secondary | ICD-10-CM | POA: Diagnosis not present

## 2017-03-27 DIAGNOSIS — I890 Lymphedema, not elsewhere classified: Secondary | ICD-10-CM | POA: Diagnosis not present

## 2017-03-27 DIAGNOSIS — L97222 Non-pressure chronic ulcer of left calf with fat layer exposed: Secondary | ICD-10-CM | POA: Diagnosis not present

## 2017-03-27 DIAGNOSIS — L97821 Non-pressure chronic ulcer of other part of left lower leg limited to breakdown of skin: Secondary | ICD-10-CM | POA: Diagnosis not present

## 2017-03-30 DIAGNOSIS — G4733 Obstructive sleep apnea (adult) (pediatric): Secondary | ICD-10-CM | POA: Diagnosis not present

## 2017-03-30 DIAGNOSIS — E7800 Pure hypercholesterolemia, unspecified: Secondary | ICD-10-CM | POA: Diagnosis not present

## 2017-03-30 DIAGNOSIS — I10 Essential (primary) hypertension: Secondary | ICD-10-CM | POA: Diagnosis not present

## 2017-03-30 DIAGNOSIS — E1129 Type 2 diabetes mellitus with other diabetic kidney complication: Secondary | ICD-10-CM | POA: Diagnosis not present

## 2017-03-30 DIAGNOSIS — I5032 Chronic diastolic (congestive) heart failure: Secondary | ICD-10-CM | POA: Diagnosis not present

## 2017-04-03 DIAGNOSIS — E1129 Type 2 diabetes mellitus with other diabetic kidney complication: Secondary | ICD-10-CM | POA: Diagnosis not present

## 2017-04-03 DIAGNOSIS — L97829 Non-pressure chronic ulcer of other part of left lower leg with unspecified severity: Secondary | ICD-10-CM | POA: Diagnosis not present

## 2017-04-03 DIAGNOSIS — I10 Essential (primary) hypertension: Secondary | ICD-10-CM | POA: Diagnosis not present

## 2017-04-03 DIAGNOSIS — L97819 Non-pressure chronic ulcer of other part of right lower leg with unspecified severity: Secondary | ICD-10-CM | POA: Diagnosis not present

## 2017-04-03 DIAGNOSIS — G4733 Obstructive sleep apnea (adult) (pediatric): Secondary | ICD-10-CM | POA: Diagnosis not present

## 2017-04-03 DIAGNOSIS — E7800 Pure hypercholesterolemia, unspecified: Secondary | ICD-10-CM | POA: Diagnosis not present

## 2017-04-03 DIAGNOSIS — L97821 Non-pressure chronic ulcer of other part of left lower leg limited to breakdown of skin: Secondary | ICD-10-CM | POA: Diagnosis not present

## 2017-04-03 DIAGNOSIS — I87333 Chronic venous hypertension (idiopathic) with ulcer and inflammation of bilateral lower extremity: Secondary | ICD-10-CM | POA: Diagnosis not present

## 2017-04-03 DIAGNOSIS — R296 Repeated falls: Secondary | ICD-10-CM | POA: Diagnosis not present

## 2017-04-03 DIAGNOSIS — E1121 Type 2 diabetes mellitus with diabetic nephropathy: Secondary | ICD-10-CM | POA: Diagnosis not present

## 2017-04-03 DIAGNOSIS — L97222 Non-pressure chronic ulcer of left calf with fat layer exposed: Secondary | ICD-10-CM | POA: Diagnosis not present

## 2017-04-03 DIAGNOSIS — L97219 Non-pressure chronic ulcer of right calf with unspecified severity: Secondary | ICD-10-CM | POA: Diagnosis not present

## 2017-04-03 DIAGNOSIS — I890 Lymphedema, not elsewhere classified: Secondary | ICD-10-CM | POA: Diagnosis not present

## 2017-04-03 DIAGNOSIS — I87313 Chronic venous hypertension (idiopathic) with ulcer of bilateral lower extremity: Secondary | ICD-10-CM | POA: Diagnosis not present

## 2017-04-03 DIAGNOSIS — Z6841 Body Mass Index (BMI) 40.0 and over, adult: Secondary | ICD-10-CM | POA: Diagnosis not present

## 2017-04-05 DIAGNOSIS — L97819 Non-pressure chronic ulcer of other part of right lower leg with unspecified severity: Secondary | ICD-10-CM | POA: Diagnosis not present

## 2017-04-05 DIAGNOSIS — I890 Lymphedema, not elsewhere classified: Secondary | ICD-10-CM | POA: Diagnosis not present

## 2017-04-05 DIAGNOSIS — L97821 Non-pressure chronic ulcer of other part of left lower leg limited to breakdown of skin: Secondary | ICD-10-CM | POA: Diagnosis not present

## 2017-04-05 DIAGNOSIS — L97222 Non-pressure chronic ulcer of left calf with fat layer exposed: Secondary | ICD-10-CM | POA: Diagnosis not present

## 2017-04-05 DIAGNOSIS — L97829 Non-pressure chronic ulcer of other part of left lower leg with unspecified severity: Secondary | ICD-10-CM | POA: Diagnosis not present

## 2017-04-05 DIAGNOSIS — I87333 Chronic venous hypertension (idiopathic) with ulcer and inflammation of bilateral lower extremity: Secondary | ICD-10-CM | POA: Diagnosis not present

## 2017-04-10 DIAGNOSIS — I87332 Chronic venous hypertension (idiopathic) with ulcer and inflammation of left lower extremity: Secondary | ICD-10-CM | POA: Diagnosis not present

## 2017-04-10 DIAGNOSIS — L97222 Non-pressure chronic ulcer of left calf with fat layer exposed: Secondary | ICD-10-CM | POA: Diagnosis not present

## 2017-04-10 DIAGNOSIS — L97819 Non-pressure chronic ulcer of other part of right lower leg with unspecified severity: Secondary | ICD-10-CM | POA: Diagnosis not present

## 2017-04-10 DIAGNOSIS — I87333 Chronic venous hypertension (idiopathic) with ulcer and inflammation of bilateral lower extremity: Secondary | ICD-10-CM | POA: Diagnosis not present

## 2017-04-10 DIAGNOSIS — L97821 Non-pressure chronic ulcer of other part of left lower leg limited to breakdown of skin: Secondary | ICD-10-CM | POA: Diagnosis not present

## 2017-04-10 DIAGNOSIS — I890 Lymphedema, not elsewhere classified: Secondary | ICD-10-CM | POA: Diagnosis not present

## 2017-04-10 DIAGNOSIS — L97829 Non-pressure chronic ulcer of other part of left lower leg with unspecified severity: Secondary | ICD-10-CM | POA: Diagnosis not present

## 2017-04-17 DIAGNOSIS — I890 Lymphedema, not elsewhere classified: Secondary | ICD-10-CM | POA: Diagnosis not present

## 2017-04-17 DIAGNOSIS — L97822 Non-pressure chronic ulcer of other part of left lower leg with fat layer exposed: Secondary | ICD-10-CM | POA: Diagnosis not present

## 2017-04-17 DIAGNOSIS — L97819 Non-pressure chronic ulcer of other part of right lower leg with unspecified severity: Secondary | ICD-10-CM | POA: Diagnosis not present

## 2017-04-17 DIAGNOSIS — L97829 Non-pressure chronic ulcer of other part of left lower leg with unspecified severity: Secondary | ICD-10-CM | POA: Diagnosis not present

## 2017-04-17 DIAGNOSIS — L97821 Non-pressure chronic ulcer of other part of left lower leg limited to breakdown of skin: Secondary | ICD-10-CM | POA: Diagnosis not present

## 2017-04-17 DIAGNOSIS — I87333 Chronic venous hypertension (idiopathic) with ulcer and inflammation of bilateral lower extremity: Secondary | ICD-10-CM | POA: Diagnosis not present

## 2017-04-17 DIAGNOSIS — L97222 Non-pressure chronic ulcer of left calf with fat layer exposed: Secondary | ICD-10-CM | POA: Diagnosis not present

## 2017-04-17 DIAGNOSIS — I87332 Chronic venous hypertension (idiopathic) with ulcer and inflammation of left lower extremity: Secondary | ICD-10-CM | POA: Diagnosis not present

## 2017-04-19 ENCOUNTER — Other Ambulatory Visit: Payer: Self-pay | Admitting: Cardiology

## 2017-04-24 ENCOUNTER — Encounter (HOSPITAL_BASED_OUTPATIENT_CLINIC_OR_DEPARTMENT_OTHER): Payer: Medicare Other | Attending: Surgery

## 2017-04-24 DIAGNOSIS — I87332 Chronic venous hypertension (idiopathic) with ulcer and inflammation of left lower extremity: Secondary | ICD-10-CM | POA: Diagnosis not present

## 2017-04-24 DIAGNOSIS — L97829 Non-pressure chronic ulcer of other part of left lower leg with unspecified severity: Secondary | ICD-10-CM | POA: Diagnosis not present

## 2017-04-24 DIAGNOSIS — I10 Essential (primary) hypertension: Secondary | ICD-10-CM | POA: Insufficient documentation

## 2017-04-24 DIAGNOSIS — L97229 Non-pressure chronic ulcer of left calf with unspecified severity: Secondary | ICD-10-CM | POA: Insufficient documentation

## 2017-04-24 DIAGNOSIS — G4730 Sleep apnea, unspecified: Secondary | ICD-10-CM | POA: Insufficient documentation

## 2017-04-24 DIAGNOSIS — L97222 Non-pressure chronic ulcer of left calf with fat layer exposed: Secondary | ICD-10-CM | POA: Diagnosis not present

## 2017-04-24 DIAGNOSIS — L97822 Non-pressure chronic ulcer of other part of left lower leg with fat layer exposed: Secondary | ICD-10-CM | POA: Diagnosis not present

## 2017-04-24 DIAGNOSIS — I890 Lymphedema, not elsewhere classified: Secondary | ICD-10-CM | POA: Diagnosis not present

## 2017-05-01 DIAGNOSIS — L97229 Non-pressure chronic ulcer of left calf with unspecified severity: Secondary | ICD-10-CM | POA: Diagnosis not present

## 2017-05-01 DIAGNOSIS — L97822 Non-pressure chronic ulcer of other part of left lower leg with fat layer exposed: Secondary | ICD-10-CM | POA: Diagnosis not present

## 2017-05-01 DIAGNOSIS — G4730 Sleep apnea, unspecified: Secondary | ICD-10-CM | POA: Diagnosis not present

## 2017-05-01 DIAGNOSIS — L97829 Non-pressure chronic ulcer of other part of left lower leg with unspecified severity: Secondary | ICD-10-CM | POA: Diagnosis not present

## 2017-05-01 DIAGNOSIS — I890 Lymphedema, not elsewhere classified: Secondary | ICD-10-CM | POA: Diagnosis not present

## 2017-05-01 DIAGNOSIS — I10 Essential (primary) hypertension: Secondary | ICD-10-CM | POA: Diagnosis not present

## 2017-05-01 DIAGNOSIS — I87332 Chronic venous hypertension (idiopathic) with ulcer and inflammation of left lower extremity: Secondary | ICD-10-CM | POA: Diagnosis not present

## 2017-05-01 DIAGNOSIS — L97222 Non-pressure chronic ulcer of left calf with fat layer exposed: Secondary | ICD-10-CM | POA: Diagnosis not present

## 2017-05-08 DIAGNOSIS — L97222 Non-pressure chronic ulcer of left calf with fat layer exposed: Secondary | ICD-10-CM | POA: Diagnosis not present

## 2017-05-08 DIAGNOSIS — L97829 Non-pressure chronic ulcer of other part of left lower leg with unspecified severity: Secondary | ICD-10-CM | POA: Diagnosis not present

## 2017-05-08 DIAGNOSIS — L97822 Non-pressure chronic ulcer of other part of left lower leg with fat layer exposed: Secondary | ICD-10-CM | POA: Diagnosis not present

## 2017-05-08 DIAGNOSIS — L97229 Non-pressure chronic ulcer of left calf with unspecified severity: Secondary | ICD-10-CM | POA: Diagnosis not present

## 2017-05-08 DIAGNOSIS — I890 Lymphedema, not elsewhere classified: Secondary | ICD-10-CM | POA: Diagnosis not present

## 2017-05-08 DIAGNOSIS — G4730 Sleep apnea, unspecified: Secondary | ICD-10-CM | POA: Diagnosis not present

## 2017-05-08 DIAGNOSIS — I10 Essential (primary) hypertension: Secondary | ICD-10-CM | POA: Diagnosis not present

## 2017-05-08 DIAGNOSIS — I87332 Chronic venous hypertension (idiopathic) with ulcer and inflammation of left lower extremity: Secondary | ICD-10-CM | POA: Diagnosis not present

## 2017-05-15 DIAGNOSIS — L97229 Non-pressure chronic ulcer of left calf with unspecified severity: Secondary | ICD-10-CM | POA: Diagnosis not present

## 2017-05-15 DIAGNOSIS — I10 Essential (primary) hypertension: Secondary | ICD-10-CM | POA: Diagnosis not present

## 2017-05-15 DIAGNOSIS — G4730 Sleep apnea, unspecified: Secondary | ICD-10-CM | POA: Diagnosis not present

## 2017-05-15 DIAGNOSIS — L97822 Non-pressure chronic ulcer of other part of left lower leg with fat layer exposed: Secondary | ICD-10-CM | POA: Diagnosis not present

## 2017-05-15 DIAGNOSIS — L97222 Non-pressure chronic ulcer of left calf with fat layer exposed: Secondary | ICD-10-CM | POA: Diagnosis not present

## 2017-05-15 DIAGNOSIS — L97829 Non-pressure chronic ulcer of other part of left lower leg with unspecified severity: Secondary | ICD-10-CM | POA: Diagnosis not present

## 2017-05-15 DIAGNOSIS — I87332 Chronic venous hypertension (idiopathic) with ulcer and inflammation of left lower extremity: Secondary | ICD-10-CM | POA: Diagnosis not present

## 2017-05-15 DIAGNOSIS — I890 Lymphedema, not elsewhere classified: Secondary | ICD-10-CM | POA: Diagnosis not present

## 2017-05-22 ENCOUNTER — Encounter (HOSPITAL_BASED_OUTPATIENT_CLINIC_OR_DEPARTMENT_OTHER): Payer: Medicare Other | Attending: Surgery

## 2017-05-22 DIAGNOSIS — I890 Lymphedema, not elsewhere classified: Secondary | ICD-10-CM | POA: Insufficient documentation

## 2017-05-22 DIAGNOSIS — I10 Essential (primary) hypertension: Secondary | ICD-10-CM | POA: Diagnosis not present

## 2017-05-22 DIAGNOSIS — L97212 Non-pressure chronic ulcer of right calf with fat layer exposed: Secondary | ICD-10-CM | POA: Diagnosis not present

## 2017-05-22 DIAGNOSIS — G4730 Sleep apnea, unspecified: Secondary | ICD-10-CM | POA: Diagnosis not present

## 2017-05-22 DIAGNOSIS — I87333 Chronic venous hypertension (idiopathic) with ulcer and inflammation of bilateral lower extremity: Secondary | ICD-10-CM | POA: Insufficient documentation

## 2017-05-22 DIAGNOSIS — L97229 Non-pressure chronic ulcer of left calf with unspecified severity: Secondary | ICD-10-CM | POA: Diagnosis not present

## 2017-05-22 DIAGNOSIS — I87332 Chronic venous hypertension (idiopathic) with ulcer and inflammation of left lower extremity: Secondary | ICD-10-CM | POA: Diagnosis not present

## 2017-05-22 DIAGNOSIS — L97829 Non-pressure chronic ulcer of other part of left lower leg with unspecified severity: Secondary | ICD-10-CM | POA: Diagnosis not present

## 2017-05-29 DIAGNOSIS — I87333 Chronic venous hypertension (idiopathic) with ulcer and inflammation of bilateral lower extremity: Secondary | ICD-10-CM | POA: Diagnosis not present

## 2017-05-29 DIAGNOSIS — I10 Essential (primary) hypertension: Secondary | ICD-10-CM | POA: Diagnosis not present

## 2017-05-29 DIAGNOSIS — L97822 Non-pressure chronic ulcer of other part of left lower leg with fat layer exposed: Secondary | ICD-10-CM | POA: Diagnosis not present

## 2017-05-29 DIAGNOSIS — L97212 Non-pressure chronic ulcer of right calf with fat layer exposed: Secondary | ICD-10-CM | POA: Diagnosis not present

## 2017-05-29 DIAGNOSIS — I890 Lymphedema, not elsewhere classified: Secondary | ICD-10-CM | POA: Diagnosis not present

## 2017-05-29 DIAGNOSIS — I87332 Chronic venous hypertension (idiopathic) with ulcer and inflammation of left lower extremity: Secondary | ICD-10-CM | POA: Diagnosis not present

## 2017-05-29 DIAGNOSIS — L97229 Non-pressure chronic ulcer of left calf with unspecified severity: Secondary | ICD-10-CM | POA: Diagnosis not present

## 2017-05-29 DIAGNOSIS — G4730 Sleep apnea, unspecified: Secondary | ICD-10-CM | POA: Diagnosis not present

## 2017-06-05 DIAGNOSIS — I87333 Chronic venous hypertension (idiopathic) with ulcer and inflammation of bilateral lower extremity: Secondary | ICD-10-CM | POA: Diagnosis not present

## 2017-06-05 DIAGNOSIS — I890 Lymphedema, not elsewhere classified: Secondary | ICD-10-CM | POA: Diagnosis not present

## 2017-06-05 DIAGNOSIS — L97222 Non-pressure chronic ulcer of left calf with fat layer exposed: Secondary | ICD-10-CM | POA: Diagnosis not present

## 2017-06-05 DIAGNOSIS — I10 Essential (primary) hypertension: Secondary | ICD-10-CM | POA: Diagnosis not present

## 2017-06-05 DIAGNOSIS — L97212 Non-pressure chronic ulcer of right calf with fat layer exposed: Secondary | ICD-10-CM | POA: Diagnosis not present

## 2017-06-05 DIAGNOSIS — L97822 Non-pressure chronic ulcer of other part of left lower leg with fat layer exposed: Secondary | ICD-10-CM | POA: Diagnosis not present

## 2017-06-05 DIAGNOSIS — I87332 Chronic venous hypertension (idiopathic) with ulcer and inflammation of left lower extremity: Secondary | ICD-10-CM | POA: Diagnosis not present

## 2017-06-05 DIAGNOSIS — G4730 Sleep apnea, unspecified: Secondary | ICD-10-CM | POA: Diagnosis not present

## 2017-06-05 DIAGNOSIS — I87312 Chronic venous hypertension (idiopathic) with ulcer of left lower extremity: Secondary | ICD-10-CM | POA: Diagnosis not present

## 2017-06-12 DIAGNOSIS — I10 Essential (primary) hypertension: Secondary | ICD-10-CM | POA: Diagnosis not present

## 2017-06-12 DIAGNOSIS — L97812 Non-pressure chronic ulcer of other part of right lower leg with fat layer exposed: Secondary | ICD-10-CM | POA: Diagnosis not present

## 2017-06-12 DIAGNOSIS — I890 Lymphedema, not elsewhere classified: Secondary | ICD-10-CM | POA: Diagnosis not present

## 2017-06-12 DIAGNOSIS — I87332 Chronic venous hypertension (idiopathic) with ulcer and inflammation of left lower extremity: Secondary | ICD-10-CM | POA: Diagnosis not present

## 2017-06-12 DIAGNOSIS — L97212 Non-pressure chronic ulcer of right calf with fat layer exposed: Secondary | ICD-10-CM | POA: Diagnosis not present

## 2017-06-12 DIAGNOSIS — I87333 Chronic venous hypertension (idiopathic) with ulcer and inflammation of bilateral lower extremity: Secondary | ICD-10-CM | POA: Diagnosis not present

## 2017-06-12 DIAGNOSIS — G4730 Sleep apnea, unspecified: Secondary | ICD-10-CM | POA: Diagnosis not present

## 2017-06-12 DIAGNOSIS — L97822 Non-pressure chronic ulcer of other part of left lower leg with fat layer exposed: Secondary | ICD-10-CM | POA: Diagnosis not present

## 2017-06-12 DIAGNOSIS — L97222 Non-pressure chronic ulcer of left calf with fat layer exposed: Secondary | ICD-10-CM | POA: Diagnosis not present

## 2017-06-15 DIAGNOSIS — I87332 Chronic venous hypertension (idiopathic) with ulcer and inflammation of left lower extremity: Secondary | ICD-10-CM | POA: Diagnosis not present

## 2017-06-15 DIAGNOSIS — I890 Lymphedema, not elsewhere classified: Secondary | ICD-10-CM | POA: Diagnosis not present

## 2017-06-15 DIAGNOSIS — I87333 Chronic venous hypertension (idiopathic) with ulcer and inflammation of bilateral lower extremity: Secondary | ICD-10-CM | POA: Diagnosis not present

## 2017-06-15 DIAGNOSIS — I10 Essential (primary) hypertension: Secondary | ICD-10-CM | POA: Diagnosis not present

## 2017-06-15 DIAGNOSIS — L97212 Non-pressure chronic ulcer of right calf with fat layer exposed: Secondary | ICD-10-CM | POA: Diagnosis not present

## 2017-06-15 DIAGNOSIS — G4730 Sleep apnea, unspecified: Secondary | ICD-10-CM | POA: Diagnosis not present

## 2017-06-21 DIAGNOSIS — L0390 Cellulitis, unspecified: Secondary | ICD-10-CM | POA: Diagnosis not present

## 2017-06-26 ENCOUNTER — Encounter (HOSPITAL_BASED_OUTPATIENT_CLINIC_OR_DEPARTMENT_OTHER): Payer: Medicare Other | Attending: Surgery

## 2017-06-26 DIAGNOSIS — L97829 Non-pressure chronic ulcer of other part of left lower leg with unspecified severity: Secondary | ICD-10-CM | POA: Insufficient documentation

## 2017-06-26 DIAGNOSIS — L97819 Non-pressure chronic ulcer of other part of right lower leg with unspecified severity: Secondary | ICD-10-CM | POA: Diagnosis not present

## 2017-06-26 DIAGNOSIS — L97222 Non-pressure chronic ulcer of left calf with fat layer exposed: Secondary | ICD-10-CM | POA: Diagnosis not present

## 2017-06-26 DIAGNOSIS — I890 Lymphedema, not elsewhere classified: Secondary | ICD-10-CM | POA: Diagnosis not present

## 2017-06-26 DIAGNOSIS — I10 Essential (primary) hypertension: Secondary | ICD-10-CM | POA: Diagnosis not present

## 2017-06-26 DIAGNOSIS — L97212 Non-pressure chronic ulcer of right calf with fat layer exposed: Secondary | ICD-10-CM | POA: Insufficient documentation

## 2017-06-26 DIAGNOSIS — I87333 Chronic venous hypertension (idiopathic) with ulcer and inflammation of bilateral lower extremity: Secondary | ICD-10-CM | POA: Diagnosis not present

## 2017-06-26 DIAGNOSIS — L97822 Non-pressure chronic ulcer of other part of left lower leg with fat layer exposed: Secondary | ICD-10-CM | POA: Diagnosis not present

## 2017-06-26 DIAGNOSIS — L97812 Non-pressure chronic ulcer of other part of right lower leg with fat layer exposed: Secondary | ICD-10-CM | POA: Diagnosis not present

## 2017-06-26 DIAGNOSIS — G4730 Sleep apnea, unspecified: Secondary | ICD-10-CM | POA: Diagnosis not present

## 2017-07-03 DIAGNOSIS — L97829 Non-pressure chronic ulcer of other part of left lower leg with unspecified severity: Secondary | ICD-10-CM | POA: Diagnosis not present

## 2017-07-03 DIAGNOSIS — L97212 Non-pressure chronic ulcer of right calf with fat layer exposed: Secondary | ICD-10-CM | POA: Diagnosis not present

## 2017-07-03 DIAGNOSIS — I890 Lymphedema, not elsewhere classified: Secondary | ICD-10-CM | POA: Diagnosis not present

## 2017-07-03 DIAGNOSIS — L97822 Non-pressure chronic ulcer of other part of left lower leg with fat layer exposed: Secondary | ICD-10-CM | POA: Diagnosis not present

## 2017-07-03 DIAGNOSIS — L97222 Non-pressure chronic ulcer of left calf with fat layer exposed: Secondary | ICD-10-CM | POA: Diagnosis not present

## 2017-07-03 DIAGNOSIS — I87333 Chronic venous hypertension (idiopathic) with ulcer and inflammation of bilateral lower extremity: Secondary | ICD-10-CM | POA: Diagnosis not present

## 2017-07-03 DIAGNOSIS — L97819 Non-pressure chronic ulcer of other part of right lower leg with unspecified severity: Secondary | ICD-10-CM | POA: Diagnosis not present

## 2017-07-10 DIAGNOSIS — L97829 Non-pressure chronic ulcer of other part of left lower leg with unspecified severity: Secondary | ICD-10-CM | POA: Diagnosis not present

## 2017-07-10 DIAGNOSIS — L97222 Non-pressure chronic ulcer of left calf with fat layer exposed: Secondary | ICD-10-CM | POA: Diagnosis not present

## 2017-07-10 DIAGNOSIS — L97812 Non-pressure chronic ulcer of other part of right lower leg with fat layer exposed: Secondary | ICD-10-CM | POA: Diagnosis not present

## 2017-07-10 DIAGNOSIS — I87333 Chronic venous hypertension (idiopathic) with ulcer and inflammation of bilateral lower extremity: Secondary | ICD-10-CM | POA: Diagnosis not present

## 2017-07-10 DIAGNOSIS — L97822 Non-pressure chronic ulcer of other part of left lower leg with fat layer exposed: Secondary | ICD-10-CM | POA: Diagnosis not present

## 2017-07-10 DIAGNOSIS — L97819 Non-pressure chronic ulcer of other part of right lower leg with unspecified severity: Secondary | ICD-10-CM | POA: Diagnosis not present

## 2017-07-10 DIAGNOSIS — L97212 Non-pressure chronic ulcer of right calf with fat layer exposed: Secondary | ICD-10-CM | POA: Diagnosis not present

## 2017-07-10 DIAGNOSIS — I890 Lymphedema, not elsewhere classified: Secondary | ICD-10-CM | POA: Diagnosis not present

## 2017-07-14 DIAGNOSIS — L03116 Cellulitis of left lower limb: Secondary | ICD-10-CM | POA: Diagnosis not present

## 2017-07-14 DIAGNOSIS — Z6841 Body Mass Index (BMI) 40.0 and over, adult: Secondary | ICD-10-CM | POA: Diagnosis not present

## 2017-07-17 DIAGNOSIS — I890 Lymphedema, not elsewhere classified: Secondary | ICD-10-CM | POA: Diagnosis not present

## 2017-07-17 DIAGNOSIS — L97822 Non-pressure chronic ulcer of other part of left lower leg with fat layer exposed: Secondary | ICD-10-CM | POA: Diagnosis not present

## 2017-07-17 DIAGNOSIS — L97829 Non-pressure chronic ulcer of other part of left lower leg with unspecified severity: Secondary | ICD-10-CM | POA: Diagnosis not present

## 2017-07-17 DIAGNOSIS — I87333 Chronic venous hypertension (idiopathic) with ulcer and inflammation of bilateral lower extremity: Secondary | ICD-10-CM | POA: Diagnosis not present

## 2017-07-17 DIAGNOSIS — L97212 Non-pressure chronic ulcer of right calf with fat layer exposed: Secondary | ICD-10-CM | POA: Diagnosis not present

## 2017-07-17 DIAGNOSIS — L97819 Non-pressure chronic ulcer of other part of right lower leg with unspecified severity: Secondary | ICD-10-CM | POA: Diagnosis not present

## 2017-07-20 ENCOUNTER — Encounter (HOSPITAL_BASED_OUTPATIENT_CLINIC_OR_DEPARTMENT_OTHER): Payer: Medicare Other | Attending: Surgery

## 2017-07-20 DIAGNOSIS — I87332 Chronic venous hypertension (idiopathic) with ulcer and inflammation of left lower extremity: Secondary | ICD-10-CM | POA: Diagnosis not present

## 2017-07-20 DIAGNOSIS — I10 Essential (primary) hypertension: Secondary | ICD-10-CM | POA: Diagnosis not present

## 2017-07-20 DIAGNOSIS — Z6841 Body Mass Index (BMI) 40.0 and over, adult: Secondary | ICD-10-CM | POA: Diagnosis not present

## 2017-07-20 DIAGNOSIS — G4730 Sleep apnea, unspecified: Secondary | ICD-10-CM | POA: Insufficient documentation

## 2017-07-20 DIAGNOSIS — L97829 Non-pressure chronic ulcer of other part of left lower leg with unspecified severity: Secondary | ICD-10-CM | POA: Diagnosis not present

## 2017-07-20 DIAGNOSIS — I890 Lymphedema, not elsewhere classified: Secondary | ICD-10-CM | POA: Insufficient documentation

## 2017-07-24 ENCOUNTER — Encounter (HOSPITAL_BASED_OUTPATIENT_CLINIC_OR_DEPARTMENT_OTHER): Payer: Medicare Other

## 2017-07-24 DIAGNOSIS — I10 Essential (primary) hypertension: Secondary | ICD-10-CM | POA: Diagnosis not present

## 2017-07-24 DIAGNOSIS — G4730 Sleep apnea, unspecified: Secondary | ICD-10-CM | POA: Diagnosis not present

## 2017-07-24 DIAGNOSIS — L97829 Non-pressure chronic ulcer of other part of left lower leg with unspecified severity: Secondary | ICD-10-CM | POA: Diagnosis not present

## 2017-07-24 DIAGNOSIS — L97822 Non-pressure chronic ulcer of other part of left lower leg with fat layer exposed: Secondary | ICD-10-CM | POA: Diagnosis not present

## 2017-07-24 DIAGNOSIS — I87332 Chronic venous hypertension (idiopathic) with ulcer and inflammation of left lower extremity: Secondary | ICD-10-CM | POA: Diagnosis not present

## 2017-07-24 DIAGNOSIS — I890 Lymphedema, not elsewhere classified: Secondary | ICD-10-CM | POA: Diagnosis not present

## 2017-07-30 DIAGNOSIS — E7800 Pure hypercholesterolemia, unspecified: Secondary | ICD-10-CM | POA: Diagnosis not present

## 2017-07-30 DIAGNOSIS — I5032 Chronic diastolic (congestive) heart failure: Secondary | ICD-10-CM | POA: Diagnosis not present

## 2017-07-30 DIAGNOSIS — M109 Gout, unspecified: Secondary | ICD-10-CM | POA: Diagnosis not present

## 2017-07-30 DIAGNOSIS — E1129 Type 2 diabetes mellitus with other diabetic kidney complication: Secondary | ICD-10-CM | POA: Diagnosis not present

## 2017-07-30 DIAGNOSIS — I10 Essential (primary) hypertension: Secondary | ICD-10-CM | POA: Diagnosis not present

## 2017-07-30 DIAGNOSIS — E1121 Type 2 diabetes mellitus with diabetic nephropathy: Secondary | ICD-10-CM | POA: Diagnosis not present

## 2017-07-31 DIAGNOSIS — I890 Lymphedema, not elsewhere classified: Secondary | ICD-10-CM | POA: Diagnosis not present

## 2017-07-31 DIAGNOSIS — I87332 Chronic venous hypertension (idiopathic) with ulcer and inflammation of left lower extremity: Secondary | ICD-10-CM | POA: Diagnosis not present

## 2017-07-31 DIAGNOSIS — L97822 Non-pressure chronic ulcer of other part of left lower leg with fat layer exposed: Secondary | ICD-10-CM | POA: Diagnosis not present

## 2017-07-31 DIAGNOSIS — L97829 Non-pressure chronic ulcer of other part of left lower leg with unspecified severity: Secondary | ICD-10-CM | POA: Diagnosis not present

## 2017-07-31 DIAGNOSIS — I10 Essential (primary) hypertension: Secondary | ICD-10-CM | POA: Diagnosis not present

## 2017-07-31 DIAGNOSIS — L97222 Non-pressure chronic ulcer of left calf with fat layer exposed: Secondary | ICD-10-CM | POA: Diagnosis not present

## 2017-07-31 DIAGNOSIS — G4730 Sleep apnea, unspecified: Secondary | ICD-10-CM | POA: Diagnosis not present

## 2017-08-03 DIAGNOSIS — G4730 Sleep apnea, unspecified: Secondary | ICD-10-CM | POA: Diagnosis not present

## 2017-08-03 DIAGNOSIS — Z23 Encounter for immunization: Secondary | ICD-10-CM | POA: Diagnosis not present

## 2017-08-03 DIAGNOSIS — E7800 Pure hypercholesterolemia, unspecified: Secondary | ICD-10-CM | POA: Diagnosis not present

## 2017-08-03 DIAGNOSIS — I890 Lymphedema, not elsewhere classified: Secondary | ICD-10-CM | POA: Diagnosis not present

## 2017-08-03 DIAGNOSIS — Z6841 Body Mass Index (BMI) 40.0 and over, adult: Secondary | ICD-10-CM | POA: Diagnosis not present

## 2017-08-03 DIAGNOSIS — L97829 Non-pressure chronic ulcer of other part of left lower leg with unspecified severity: Secondary | ICD-10-CM | POA: Diagnosis not present

## 2017-08-03 DIAGNOSIS — I87332 Chronic venous hypertension (idiopathic) with ulcer and inflammation of left lower extremity: Secondary | ICD-10-CM | POA: Diagnosis not present

## 2017-08-03 DIAGNOSIS — R296 Repeated falls: Secondary | ICD-10-CM | POA: Diagnosis not present

## 2017-08-03 DIAGNOSIS — E1121 Type 2 diabetes mellitus with diabetic nephropathy: Secondary | ICD-10-CM | POA: Diagnosis not present

## 2017-08-03 DIAGNOSIS — E1129 Type 2 diabetes mellitus with other diabetic kidney complication: Secondary | ICD-10-CM | POA: Diagnosis not present

## 2017-08-03 DIAGNOSIS — S60221A Contusion of right hand, initial encounter: Secondary | ICD-10-CM | POA: Diagnosis not present

## 2017-08-03 DIAGNOSIS — I10 Essential (primary) hypertension: Secondary | ICD-10-CM | POA: Diagnosis not present

## 2017-08-07 DIAGNOSIS — I10 Essential (primary) hypertension: Secondary | ICD-10-CM | POA: Diagnosis not present

## 2017-08-07 DIAGNOSIS — I87332 Chronic venous hypertension (idiopathic) with ulcer and inflammation of left lower extremity: Secondary | ICD-10-CM | POA: Diagnosis not present

## 2017-08-07 DIAGNOSIS — I87312 Chronic venous hypertension (idiopathic) with ulcer of left lower extremity: Secondary | ICD-10-CM | POA: Diagnosis not present

## 2017-08-07 DIAGNOSIS — G4730 Sleep apnea, unspecified: Secondary | ICD-10-CM | POA: Diagnosis not present

## 2017-08-07 DIAGNOSIS — I890 Lymphedema, not elsewhere classified: Secondary | ICD-10-CM | POA: Diagnosis not present

## 2017-08-07 DIAGNOSIS — L97829 Non-pressure chronic ulcer of other part of left lower leg with unspecified severity: Secondary | ICD-10-CM | POA: Diagnosis not present

## 2017-08-07 DIAGNOSIS — L97822 Non-pressure chronic ulcer of other part of left lower leg with fat layer exposed: Secondary | ICD-10-CM | POA: Diagnosis not present

## 2017-08-14 DIAGNOSIS — G4730 Sleep apnea, unspecified: Secondary | ICD-10-CM | POA: Diagnosis not present

## 2017-08-14 DIAGNOSIS — I87332 Chronic venous hypertension (idiopathic) with ulcer and inflammation of left lower extremity: Secondary | ICD-10-CM | POA: Diagnosis not present

## 2017-08-14 DIAGNOSIS — L97222 Non-pressure chronic ulcer of left calf with fat layer exposed: Secondary | ICD-10-CM | POA: Diagnosis not present

## 2017-08-14 DIAGNOSIS — L97822 Non-pressure chronic ulcer of other part of left lower leg with fat layer exposed: Secondary | ICD-10-CM | POA: Diagnosis not present

## 2017-08-14 DIAGNOSIS — I890 Lymphedema, not elsewhere classified: Secondary | ICD-10-CM | POA: Diagnosis not present

## 2017-08-14 DIAGNOSIS — L97829 Non-pressure chronic ulcer of other part of left lower leg with unspecified severity: Secondary | ICD-10-CM | POA: Diagnosis not present

## 2017-08-14 DIAGNOSIS — I10 Essential (primary) hypertension: Secondary | ICD-10-CM | POA: Diagnosis not present

## 2017-08-17 DIAGNOSIS — I87332 Chronic venous hypertension (idiopathic) with ulcer and inflammation of left lower extremity: Secondary | ICD-10-CM | POA: Diagnosis not present

## 2017-08-17 DIAGNOSIS — G4730 Sleep apnea, unspecified: Secondary | ICD-10-CM | POA: Diagnosis not present

## 2017-08-17 DIAGNOSIS — I890 Lymphedema, not elsewhere classified: Secondary | ICD-10-CM | POA: Diagnosis not present

## 2017-08-17 DIAGNOSIS — I10 Essential (primary) hypertension: Secondary | ICD-10-CM | POA: Diagnosis not present

## 2017-08-17 DIAGNOSIS — L97829 Non-pressure chronic ulcer of other part of left lower leg with unspecified severity: Secondary | ICD-10-CM | POA: Diagnosis not present

## 2017-08-21 ENCOUNTER — Encounter (HOSPITAL_BASED_OUTPATIENT_CLINIC_OR_DEPARTMENT_OTHER): Payer: Medicare Other | Attending: Surgery

## 2017-08-21 DIAGNOSIS — L97822 Non-pressure chronic ulcer of other part of left lower leg with fat layer exposed: Secondary | ICD-10-CM | POA: Diagnosis not present

## 2017-08-21 DIAGNOSIS — Z9119 Patient's noncompliance with other medical treatment and regimen: Secondary | ICD-10-CM | POA: Insufficient documentation

## 2017-08-21 DIAGNOSIS — Z6841 Body Mass Index (BMI) 40.0 and over, adult: Secondary | ICD-10-CM | POA: Insufficient documentation

## 2017-08-21 DIAGNOSIS — Z9111 Patient's noncompliance with dietary regimen: Secondary | ICD-10-CM | POA: Insufficient documentation

## 2017-08-21 DIAGNOSIS — I10 Essential (primary) hypertension: Secondary | ICD-10-CM | POA: Insufficient documentation

## 2017-08-21 DIAGNOSIS — L97819 Non-pressure chronic ulcer of other part of right lower leg with unspecified severity: Secondary | ICD-10-CM | POA: Insufficient documentation

## 2017-08-21 DIAGNOSIS — G4730 Sleep apnea, unspecified: Secondary | ICD-10-CM | POA: Diagnosis not present

## 2017-08-21 DIAGNOSIS — I87333 Chronic venous hypertension (idiopathic) with ulcer and inflammation of bilateral lower extremity: Secondary | ICD-10-CM | POA: Insufficient documentation

## 2017-08-21 DIAGNOSIS — I87312 Chronic venous hypertension (idiopathic) with ulcer of left lower extremity: Secondary | ICD-10-CM | POA: Diagnosis not present

## 2017-08-21 DIAGNOSIS — L97222 Non-pressure chronic ulcer of left calf with fat layer exposed: Secondary | ICD-10-CM | POA: Diagnosis not present

## 2017-08-21 DIAGNOSIS — L97829 Non-pressure chronic ulcer of other part of left lower leg with unspecified severity: Secondary | ICD-10-CM | POA: Insufficient documentation

## 2017-08-21 DIAGNOSIS — I890 Lymphedema, not elsewhere classified: Secondary | ICD-10-CM | POA: Insufficient documentation

## 2017-08-31 DIAGNOSIS — L97829 Non-pressure chronic ulcer of other part of left lower leg with unspecified severity: Secondary | ICD-10-CM | POA: Diagnosis not present

## 2017-08-31 DIAGNOSIS — I890 Lymphedema, not elsewhere classified: Secondary | ICD-10-CM | POA: Diagnosis not present

## 2017-08-31 DIAGNOSIS — L97819 Non-pressure chronic ulcer of other part of right lower leg with unspecified severity: Secondary | ICD-10-CM | POA: Diagnosis not present

## 2017-08-31 DIAGNOSIS — I87312 Chronic venous hypertension (idiopathic) with ulcer of left lower extremity: Secondary | ICD-10-CM | POA: Diagnosis not present

## 2017-08-31 DIAGNOSIS — L97222 Non-pressure chronic ulcer of left calf with fat layer exposed: Secondary | ICD-10-CM | POA: Diagnosis not present

## 2017-08-31 DIAGNOSIS — I87333 Chronic venous hypertension (idiopathic) with ulcer and inflammation of bilateral lower extremity: Secondary | ICD-10-CM | POA: Diagnosis not present

## 2017-08-31 DIAGNOSIS — L97822 Non-pressure chronic ulcer of other part of left lower leg with fat layer exposed: Secondary | ICD-10-CM | POA: Diagnosis not present

## 2017-09-04 DIAGNOSIS — I87332 Chronic venous hypertension (idiopathic) with ulcer and inflammation of left lower extremity: Secondary | ICD-10-CM | POA: Diagnosis not present

## 2017-09-04 DIAGNOSIS — L97829 Non-pressure chronic ulcer of other part of left lower leg with unspecified severity: Secondary | ICD-10-CM | POA: Diagnosis not present

## 2017-09-04 DIAGNOSIS — I87333 Chronic venous hypertension (idiopathic) with ulcer and inflammation of bilateral lower extremity: Secondary | ICD-10-CM | POA: Diagnosis not present

## 2017-09-04 DIAGNOSIS — L97819 Non-pressure chronic ulcer of other part of right lower leg with unspecified severity: Secondary | ICD-10-CM | POA: Diagnosis not present

## 2017-09-04 DIAGNOSIS — I890 Lymphedema, not elsewhere classified: Secondary | ICD-10-CM | POA: Diagnosis not present

## 2017-09-04 DIAGNOSIS — L97229 Non-pressure chronic ulcer of left calf with unspecified severity: Secondary | ICD-10-CM | POA: Diagnosis not present

## 2017-09-04 DIAGNOSIS — L97822 Non-pressure chronic ulcer of other part of left lower leg with fat layer exposed: Secondary | ICD-10-CM | POA: Diagnosis not present

## 2017-09-12 DIAGNOSIS — I87333 Chronic venous hypertension (idiopathic) with ulcer and inflammation of bilateral lower extremity: Secondary | ICD-10-CM | POA: Diagnosis not present

## 2017-09-12 DIAGNOSIS — L97819 Non-pressure chronic ulcer of other part of right lower leg with unspecified severity: Secondary | ICD-10-CM | POA: Diagnosis not present

## 2017-09-12 DIAGNOSIS — I890 Lymphedema, not elsewhere classified: Secondary | ICD-10-CM | POA: Diagnosis not present

## 2017-09-12 DIAGNOSIS — L97829 Non-pressure chronic ulcer of other part of left lower leg with unspecified severity: Secondary | ICD-10-CM | POA: Diagnosis not present

## 2017-09-12 DIAGNOSIS — L97212 Non-pressure chronic ulcer of right calf with fat layer exposed: Secondary | ICD-10-CM | POA: Diagnosis not present

## 2017-09-12 DIAGNOSIS — L97822 Non-pressure chronic ulcer of other part of left lower leg with fat layer exposed: Secondary | ICD-10-CM | POA: Diagnosis not present

## 2017-09-19 ENCOUNTER — Encounter (HOSPITAL_BASED_OUTPATIENT_CLINIC_OR_DEPARTMENT_OTHER): Payer: Medicare Other | Attending: Physician Assistant

## 2017-09-19 DIAGNOSIS — L97822 Non-pressure chronic ulcer of other part of left lower leg with fat layer exposed: Secondary | ICD-10-CM | POA: Insufficient documentation

## 2017-09-19 DIAGNOSIS — L97212 Non-pressure chronic ulcer of right calf with fat layer exposed: Secondary | ICD-10-CM | POA: Insufficient documentation

## 2017-09-19 DIAGNOSIS — G4730 Sleep apnea, unspecified: Secondary | ICD-10-CM | POA: Diagnosis not present

## 2017-09-19 DIAGNOSIS — I87333 Chronic venous hypertension (idiopathic) with ulcer and inflammation of bilateral lower extremity: Secondary | ICD-10-CM | POA: Diagnosis not present

## 2017-09-19 DIAGNOSIS — I10 Essential (primary) hypertension: Secondary | ICD-10-CM | POA: Diagnosis not present

## 2017-09-19 DIAGNOSIS — I890 Lymphedema, not elsewhere classified: Secondary | ICD-10-CM | POA: Insufficient documentation

## 2017-09-19 DIAGNOSIS — L97829 Non-pressure chronic ulcer of other part of left lower leg with unspecified severity: Secondary | ICD-10-CM | POA: Diagnosis not present

## 2017-09-19 DIAGNOSIS — Z6841 Body Mass Index (BMI) 40.0 and over, adult: Secondary | ICD-10-CM | POA: Diagnosis not present

## 2017-09-25 ENCOUNTER — Encounter (HOSPITAL_BASED_OUTPATIENT_CLINIC_OR_DEPARTMENT_OTHER): Payer: Medicare Other

## 2017-09-25 DIAGNOSIS — L97822 Non-pressure chronic ulcer of other part of left lower leg with fat layer exposed: Secondary | ICD-10-CM | POA: Diagnosis not present

## 2017-09-25 DIAGNOSIS — L97829 Non-pressure chronic ulcer of other part of left lower leg with unspecified severity: Secondary | ICD-10-CM | POA: Diagnosis not present

## 2017-09-25 DIAGNOSIS — G4730 Sleep apnea, unspecified: Secondary | ICD-10-CM | POA: Diagnosis not present

## 2017-09-25 DIAGNOSIS — I87333 Chronic venous hypertension (idiopathic) with ulcer and inflammation of bilateral lower extremity: Secondary | ICD-10-CM | POA: Diagnosis not present

## 2017-09-25 DIAGNOSIS — L97212 Non-pressure chronic ulcer of right calf with fat layer exposed: Secondary | ICD-10-CM | POA: Diagnosis not present

## 2017-09-25 DIAGNOSIS — I890 Lymphedema, not elsewhere classified: Secondary | ICD-10-CM | POA: Diagnosis not present

## 2017-09-28 DIAGNOSIS — L97212 Non-pressure chronic ulcer of right calf with fat layer exposed: Secondary | ICD-10-CM | POA: Diagnosis not present

## 2017-09-28 DIAGNOSIS — I890 Lymphedema, not elsewhere classified: Secondary | ICD-10-CM | POA: Diagnosis not present

## 2017-09-28 DIAGNOSIS — G4730 Sleep apnea, unspecified: Secondary | ICD-10-CM | POA: Diagnosis not present

## 2017-09-28 DIAGNOSIS — L97822 Non-pressure chronic ulcer of other part of left lower leg with fat layer exposed: Secondary | ICD-10-CM | POA: Diagnosis not present

## 2017-09-28 DIAGNOSIS — I87333 Chronic venous hypertension (idiopathic) with ulcer and inflammation of bilateral lower extremity: Secondary | ICD-10-CM | POA: Diagnosis not present

## 2017-09-28 DIAGNOSIS — L97829 Non-pressure chronic ulcer of other part of left lower leg with unspecified severity: Secondary | ICD-10-CM | POA: Diagnosis not present

## 2017-10-02 DIAGNOSIS — L97829 Non-pressure chronic ulcer of other part of left lower leg with unspecified severity: Secondary | ICD-10-CM | POA: Diagnosis not present

## 2017-10-02 DIAGNOSIS — L97212 Non-pressure chronic ulcer of right calf with fat layer exposed: Secondary | ICD-10-CM | POA: Diagnosis not present

## 2017-10-02 DIAGNOSIS — I87313 Chronic venous hypertension (idiopathic) with ulcer of bilateral lower extremity: Secondary | ICD-10-CM | POA: Diagnosis not present

## 2017-10-02 DIAGNOSIS — I87333 Chronic venous hypertension (idiopathic) with ulcer and inflammation of bilateral lower extremity: Secondary | ICD-10-CM | POA: Diagnosis not present

## 2017-10-02 DIAGNOSIS — L97822 Non-pressure chronic ulcer of other part of left lower leg with fat layer exposed: Secondary | ICD-10-CM | POA: Diagnosis not present

## 2017-10-02 DIAGNOSIS — I890 Lymphedema, not elsewhere classified: Secondary | ICD-10-CM | POA: Diagnosis not present

## 2017-10-02 DIAGNOSIS — G4730 Sleep apnea, unspecified: Secondary | ICD-10-CM | POA: Diagnosis not present

## 2017-10-05 DIAGNOSIS — L97212 Non-pressure chronic ulcer of right calf with fat layer exposed: Secondary | ICD-10-CM | POA: Diagnosis not present

## 2017-10-05 DIAGNOSIS — I890 Lymphedema, not elsewhere classified: Secondary | ICD-10-CM | POA: Diagnosis not present

## 2017-10-05 DIAGNOSIS — L97829 Non-pressure chronic ulcer of other part of left lower leg with unspecified severity: Secondary | ICD-10-CM | POA: Diagnosis not present

## 2017-10-05 DIAGNOSIS — I87333 Chronic venous hypertension (idiopathic) with ulcer and inflammation of bilateral lower extremity: Secondary | ICD-10-CM | POA: Diagnosis not present

## 2017-10-05 DIAGNOSIS — L97822 Non-pressure chronic ulcer of other part of left lower leg with fat layer exposed: Secondary | ICD-10-CM | POA: Diagnosis not present

## 2017-10-05 DIAGNOSIS — G4730 Sleep apnea, unspecified: Secondary | ICD-10-CM | POA: Diagnosis not present

## 2017-10-09 DIAGNOSIS — I87311 Chronic venous hypertension (idiopathic) with ulcer of right lower extremity: Secondary | ICD-10-CM | POA: Diagnosis not present

## 2017-10-09 DIAGNOSIS — I890 Lymphedema, not elsewhere classified: Secondary | ICD-10-CM | POA: Diagnosis not present

## 2017-10-09 DIAGNOSIS — L97829 Non-pressure chronic ulcer of other part of left lower leg with unspecified severity: Secondary | ICD-10-CM | POA: Diagnosis not present

## 2017-10-09 DIAGNOSIS — L97212 Non-pressure chronic ulcer of right calf with fat layer exposed: Secondary | ICD-10-CM | POA: Diagnosis not present

## 2017-10-09 DIAGNOSIS — L97822 Non-pressure chronic ulcer of other part of left lower leg with fat layer exposed: Secondary | ICD-10-CM | POA: Diagnosis not present

## 2017-10-09 DIAGNOSIS — I87333 Chronic venous hypertension (idiopathic) with ulcer and inflammation of bilateral lower extremity: Secondary | ICD-10-CM | POA: Diagnosis not present

## 2017-10-09 DIAGNOSIS — G4730 Sleep apnea, unspecified: Secondary | ICD-10-CM | POA: Diagnosis not present

## 2017-10-09 DIAGNOSIS — L97812 Non-pressure chronic ulcer of other part of right lower leg with fat layer exposed: Secondary | ICD-10-CM | POA: Diagnosis not present

## 2017-10-12 DIAGNOSIS — G4730 Sleep apnea, unspecified: Secondary | ICD-10-CM | POA: Diagnosis not present

## 2017-10-12 DIAGNOSIS — L97829 Non-pressure chronic ulcer of other part of left lower leg with unspecified severity: Secondary | ICD-10-CM | POA: Diagnosis not present

## 2017-10-12 DIAGNOSIS — L97822 Non-pressure chronic ulcer of other part of left lower leg with fat layer exposed: Secondary | ICD-10-CM | POA: Diagnosis not present

## 2017-10-12 DIAGNOSIS — I87333 Chronic venous hypertension (idiopathic) with ulcer and inflammation of bilateral lower extremity: Secondary | ICD-10-CM | POA: Diagnosis not present

## 2017-10-12 DIAGNOSIS — L97212 Non-pressure chronic ulcer of right calf with fat layer exposed: Secondary | ICD-10-CM | POA: Diagnosis not present

## 2017-10-12 DIAGNOSIS — I890 Lymphedema, not elsewhere classified: Secondary | ICD-10-CM | POA: Diagnosis not present

## 2017-10-16 DIAGNOSIS — I890 Lymphedema, not elsewhere classified: Secondary | ICD-10-CM | POA: Diagnosis not present

## 2017-10-16 DIAGNOSIS — L97822 Non-pressure chronic ulcer of other part of left lower leg with fat layer exposed: Secondary | ICD-10-CM | POA: Diagnosis not present

## 2017-10-16 DIAGNOSIS — L97212 Non-pressure chronic ulcer of right calf with fat layer exposed: Secondary | ICD-10-CM | POA: Diagnosis not present

## 2017-10-16 DIAGNOSIS — G4730 Sleep apnea, unspecified: Secondary | ICD-10-CM | POA: Diagnosis not present

## 2017-10-16 DIAGNOSIS — L97829 Non-pressure chronic ulcer of other part of left lower leg with unspecified severity: Secondary | ICD-10-CM | POA: Diagnosis not present

## 2017-10-16 DIAGNOSIS — I87333 Chronic venous hypertension (idiopathic) with ulcer and inflammation of bilateral lower extremity: Secondary | ICD-10-CM | POA: Diagnosis not present

## 2017-10-19 ENCOUNTER — Encounter (HOSPITAL_BASED_OUTPATIENT_CLINIC_OR_DEPARTMENT_OTHER): Payer: Medicare Other | Attending: Internal Medicine

## 2017-10-19 DIAGNOSIS — I10 Essential (primary) hypertension: Secondary | ICD-10-CM | POA: Diagnosis not present

## 2017-10-19 DIAGNOSIS — L97812 Non-pressure chronic ulcer of other part of right lower leg with fat layer exposed: Secondary | ICD-10-CM | POA: Insufficient documentation

## 2017-10-19 DIAGNOSIS — G4730 Sleep apnea, unspecified: Secondary | ICD-10-CM | POA: Diagnosis not present

## 2017-10-19 DIAGNOSIS — I87333 Chronic venous hypertension (idiopathic) with ulcer and inflammation of bilateral lower extremity: Secondary | ICD-10-CM | POA: Diagnosis not present

## 2017-10-19 DIAGNOSIS — I890 Lymphedema, not elsewhere classified: Secondary | ICD-10-CM | POA: Insufficient documentation

## 2017-10-19 DIAGNOSIS — L97829 Non-pressure chronic ulcer of other part of left lower leg with unspecified severity: Secondary | ICD-10-CM | POA: Diagnosis not present

## 2017-10-23 DIAGNOSIS — L97812 Non-pressure chronic ulcer of other part of right lower leg with fat layer exposed: Secondary | ICD-10-CM | POA: Diagnosis not present

## 2017-10-23 DIAGNOSIS — L97822 Non-pressure chronic ulcer of other part of left lower leg with fat layer exposed: Secondary | ICD-10-CM | POA: Diagnosis not present

## 2017-10-23 DIAGNOSIS — L97212 Non-pressure chronic ulcer of right calf with fat layer exposed: Secondary | ICD-10-CM | POA: Diagnosis not present

## 2017-10-23 DIAGNOSIS — I10 Essential (primary) hypertension: Secondary | ICD-10-CM | POA: Diagnosis not present

## 2017-10-23 DIAGNOSIS — I890 Lymphedema, not elsewhere classified: Secondary | ICD-10-CM | POA: Diagnosis not present

## 2017-10-23 DIAGNOSIS — L97829 Non-pressure chronic ulcer of other part of left lower leg with unspecified severity: Secondary | ICD-10-CM | POA: Diagnosis not present

## 2017-10-23 DIAGNOSIS — G4730 Sleep apnea, unspecified: Secondary | ICD-10-CM | POA: Diagnosis not present

## 2017-10-23 DIAGNOSIS — I87333 Chronic venous hypertension (idiopathic) with ulcer and inflammation of bilateral lower extremity: Secondary | ICD-10-CM | POA: Diagnosis not present

## 2017-10-23 DIAGNOSIS — I872 Venous insufficiency (chronic) (peripheral): Secondary | ICD-10-CM | POA: Diagnosis not present

## 2017-10-26 DIAGNOSIS — I890 Lymphedema, not elsewhere classified: Secondary | ICD-10-CM | POA: Diagnosis not present

## 2017-10-26 DIAGNOSIS — G4730 Sleep apnea, unspecified: Secondary | ICD-10-CM | POA: Diagnosis not present

## 2017-10-26 DIAGNOSIS — I10 Essential (primary) hypertension: Secondary | ICD-10-CM | POA: Diagnosis not present

## 2017-10-26 DIAGNOSIS — I87333 Chronic venous hypertension (idiopathic) with ulcer and inflammation of bilateral lower extremity: Secondary | ICD-10-CM | POA: Diagnosis not present

## 2017-10-26 DIAGNOSIS — L97829 Non-pressure chronic ulcer of other part of left lower leg with unspecified severity: Secondary | ICD-10-CM | POA: Diagnosis not present

## 2017-10-26 DIAGNOSIS — L97812 Non-pressure chronic ulcer of other part of right lower leg with fat layer exposed: Secondary | ICD-10-CM | POA: Diagnosis not present

## 2017-10-30 DIAGNOSIS — L97829 Non-pressure chronic ulcer of other part of left lower leg with unspecified severity: Secondary | ICD-10-CM | POA: Diagnosis not present

## 2017-10-30 DIAGNOSIS — L97822 Non-pressure chronic ulcer of other part of left lower leg with fat layer exposed: Secondary | ICD-10-CM | POA: Diagnosis not present

## 2017-10-30 DIAGNOSIS — I87333 Chronic venous hypertension (idiopathic) with ulcer and inflammation of bilateral lower extremity: Secondary | ICD-10-CM | POA: Diagnosis not present

## 2017-10-30 DIAGNOSIS — L97212 Non-pressure chronic ulcer of right calf with fat layer exposed: Secondary | ICD-10-CM | POA: Diagnosis not present

## 2017-10-30 DIAGNOSIS — G4730 Sleep apnea, unspecified: Secondary | ICD-10-CM | POA: Diagnosis not present

## 2017-10-30 DIAGNOSIS — I890 Lymphedema, not elsewhere classified: Secondary | ICD-10-CM | POA: Diagnosis not present

## 2017-10-30 DIAGNOSIS — L97812 Non-pressure chronic ulcer of other part of right lower leg with fat layer exposed: Secondary | ICD-10-CM | POA: Diagnosis not present

## 2017-10-30 DIAGNOSIS — I10 Essential (primary) hypertension: Secondary | ICD-10-CM | POA: Diagnosis not present

## 2017-11-06 DIAGNOSIS — L97822 Non-pressure chronic ulcer of other part of left lower leg with fat layer exposed: Secondary | ICD-10-CM | POA: Diagnosis not present

## 2017-11-06 DIAGNOSIS — G4730 Sleep apnea, unspecified: Secondary | ICD-10-CM | POA: Diagnosis not present

## 2017-11-06 DIAGNOSIS — I10 Essential (primary) hypertension: Secondary | ICD-10-CM | POA: Diagnosis not present

## 2017-11-06 DIAGNOSIS — L97829 Non-pressure chronic ulcer of other part of left lower leg with unspecified severity: Secondary | ICD-10-CM | POA: Diagnosis not present

## 2017-11-06 DIAGNOSIS — L97212 Non-pressure chronic ulcer of right calf with fat layer exposed: Secondary | ICD-10-CM | POA: Diagnosis not present

## 2017-11-06 DIAGNOSIS — I87333 Chronic venous hypertension (idiopathic) with ulcer and inflammation of bilateral lower extremity: Secondary | ICD-10-CM | POA: Diagnosis not present

## 2017-11-06 DIAGNOSIS — L97812 Non-pressure chronic ulcer of other part of right lower leg with fat layer exposed: Secondary | ICD-10-CM | POA: Diagnosis not present

## 2017-11-06 DIAGNOSIS — I890 Lymphedema, not elsewhere classified: Secondary | ICD-10-CM | POA: Diagnosis not present

## 2017-11-13 DIAGNOSIS — L97822 Non-pressure chronic ulcer of other part of left lower leg with fat layer exposed: Secondary | ICD-10-CM | POA: Diagnosis not present

## 2017-11-13 DIAGNOSIS — I87313 Chronic venous hypertension (idiopathic) with ulcer of bilateral lower extremity: Secondary | ICD-10-CM | POA: Diagnosis not present

## 2017-11-13 DIAGNOSIS — I890 Lymphedema, not elsewhere classified: Secondary | ICD-10-CM | POA: Diagnosis not present

## 2017-11-13 DIAGNOSIS — L97212 Non-pressure chronic ulcer of right calf with fat layer exposed: Secondary | ICD-10-CM | POA: Diagnosis not present

## 2017-11-13 DIAGNOSIS — L97812 Non-pressure chronic ulcer of other part of right lower leg with fat layer exposed: Secondary | ICD-10-CM | POA: Diagnosis not present

## 2017-11-13 DIAGNOSIS — I10 Essential (primary) hypertension: Secondary | ICD-10-CM | POA: Diagnosis not present

## 2017-11-13 DIAGNOSIS — G4730 Sleep apnea, unspecified: Secondary | ICD-10-CM | POA: Diagnosis not present

## 2017-11-13 DIAGNOSIS — L97829 Non-pressure chronic ulcer of other part of left lower leg with unspecified severity: Secondary | ICD-10-CM | POA: Diagnosis not present

## 2017-11-13 DIAGNOSIS — I87333 Chronic venous hypertension (idiopathic) with ulcer and inflammation of bilateral lower extremity: Secondary | ICD-10-CM | POA: Diagnosis not present

## 2017-11-20 ENCOUNTER — Encounter (HOSPITAL_BASED_OUTPATIENT_CLINIC_OR_DEPARTMENT_OTHER): Payer: Medicare Other | Attending: Internal Medicine

## 2017-11-20 ENCOUNTER — Telehealth: Payer: Self-pay | Admitting: Cardiology

## 2017-11-20 DIAGNOSIS — L97819 Non-pressure chronic ulcer of other part of right lower leg with unspecified severity: Secondary | ICD-10-CM | POA: Diagnosis not present

## 2017-11-20 DIAGNOSIS — I87313 Chronic venous hypertension (idiopathic) with ulcer of bilateral lower extremity: Secondary | ICD-10-CM | POA: Diagnosis not present

## 2017-11-20 DIAGNOSIS — I890 Lymphedema, not elsewhere classified: Secondary | ICD-10-CM | POA: Insufficient documentation

## 2017-11-20 DIAGNOSIS — L97821 Non-pressure chronic ulcer of other part of left lower leg limited to breakdown of skin: Secondary | ICD-10-CM | POA: Diagnosis not present

## 2017-11-20 DIAGNOSIS — I10 Essential (primary) hypertension: Secondary | ICD-10-CM | POA: Diagnosis not present

## 2017-11-20 DIAGNOSIS — G4730 Sleep apnea, unspecified: Secondary | ICD-10-CM | POA: Diagnosis not present

## 2017-11-20 DIAGNOSIS — L97212 Non-pressure chronic ulcer of right calf with fat layer exposed: Secondary | ICD-10-CM | POA: Diagnosis not present

## 2017-11-20 DIAGNOSIS — I87333 Chronic venous hypertension (idiopathic) with ulcer and inflammation of bilateral lower extremity: Secondary | ICD-10-CM | POA: Insufficient documentation

## 2017-11-20 DIAGNOSIS — L97222 Non-pressure chronic ulcer of left calf with fat layer exposed: Secondary | ICD-10-CM | POA: Diagnosis not present

## 2017-11-20 DIAGNOSIS — L97822 Non-pressure chronic ulcer of other part of left lower leg with fat layer exposed: Secondary | ICD-10-CM | POA: Diagnosis not present

## 2017-11-20 NOTE — Telephone Encounter (Signed)
Numerous attempts to contact patient with recall letters. Unable to reach by telephone. with no success.   Delfino Lovett T [[id number]] 04/17/2016 7:56 AM New [10]   [System] 06/18/2016 11:04 PM Notification Sent [20]   Chanda Busing [[id number]] 07/27/2016 2:42 PM Scheduled/Linked [30]   Weston Anna [[id number]] 10/25/2016 3:02 PM Notification Sent [20]   Delfino Lovett T [[id number]] 02/09/2017 12:00 PM Notification Sent [20]   Chanda Busing [[id number]] 04/12/2017 3:19 PM Notification Sent [20]   Chanda Busing [[id number]] 11/20/2017 1:29 PM Notification Sent [20]

## 2017-11-27 DIAGNOSIS — L97819 Non-pressure chronic ulcer of other part of right lower leg with unspecified severity: Secondary | ICD-10-CM | POA: Diagnosis not present

## 2017-11-27 DIAGNOSIS — I87333 Chronic venous hypertension (idiopathic) with ulcer and inflammation of bilateral lower extremity: Secondary | ICD-10-CM | POA: Diagnosis not present

## 2017-11-27 DIAGNOSIS — L97212 Non-pressure chronic ulcer of right calf with fat layer exposed: Secondary | ICD-10-CM | POA: Diagnosis not present

## 2017-11-27 DIAGNOSIS — S81801A Unspecified open wound, right lower leg, initial encounter: Secondary | ICD-10-CM | POA: Diagnosis not present

## 2017-11-27 DIAGNOSIS — I890 Lymphedema, not elsewhere classified: Secondary | ICD-10-CM | POA: Diagnosis not present

## 2017-11-27 DIAGNOSIS — L97821 Non-pressure chronic ulcer of other part of left lower leg limited to breakdown of skin: Secondary | ICD-10-CM | POA: Diagnosis not present

## 2017-11-27 DIAGNOSIS — S81802A Unspecified open wound, left lower leg, initial encounter: Secondary | ICD-10-CM | POA: Diagnosis not present

## 2017-11-27 DIAGNOSIS — I10 Essential (primary) hypertension: Secondary | ICD-10-CM | POA: Diagnosis not present

## 2017-12-04 DIAGNOSIS — L97821 Non-pressure chronic ulcer of other part of left lower leg limited to breakdown of skin: Secondary | ICD-10-CM | POA: Diagnosis not present

## 2017-12-04 DIAGNOSIS — L97819 Non-pressure chronic ulcer of other part of right lower leg with unspecified severity: Secondary | ICD-10-CM | POA: Diagnosis not present

## 2017-12-04 DIAGNOSIS — L97222 Non-pressure chronic ulcer of left calf with fat layer exposed: Secondary | ICD-10-CM | POA: Diagnosis not present

## 2017-12-04 DIAGNOSIS — L97812 Non-pressure chronic ulcer of other part of right lower leg with fat layer exposed: Secondary | ICD-10-CM | POA: Diagnosis not present

## 2017-12-04 DIAGNOSIS — I890 Lymphedema, not elsewhere classified: Secondary | ICD-10-CM | POA: Diagnosis not present

## 2017-12-04 DIAGNOSIS — L97212 Non-pressure chronic ulcer of right calf with fat layer exposed: Secondary | ICD-10-CM | POA: Diagnosis not present

## 2017-12-04 DIAGNOSIS — I87333 Chronic venous hypertension (idiopathic) with ulcer and inflammation of bilateral lower extremity: Secondary | ICD-10-CM | POA: Diagnosis not present

## 2017-12-04 DIAGNOSIS — I10 Essential (primary) hypertension: Secondary | ICD-10-CM | POA: Diagnosis not present

## 2017-12-04 DIAGNOSIS — I87313 Chronic venous hypertension (idiopathic) with ulcer of bilateral lower extremity: Secondary | ICD-10-CM | POA: Diagnosis not present

## 2017-12-07 DIAGNOSIS — R296 Repeated falls: Secondary | ICD-10-CM | POA: Diagnosis not present

## 2017-12-07 DIAGNOSIS — E1129 Type 2 diabetes mellitus with other diabetic kidney complication: Secondary | ICD-10-CM | POA: Diagnosis not present

## 2017-12-07 DIAGNOSIS — Z1331 Encounter for screening for depression: Secondary | ICD-10-CM | POA: Diagnosis not present

## 2017-12-07 DIAGNOSIS — E7800 Pure hypercholesterolemia, unspecified: Secondary | ICD-10-CM | POA: Diagnosis not present

## 2017-12-07 DIAGNOSIS — Z6841 Body Mass Index (BMI) 40.0 and over, adult: Secondary | ICD-10-CM | POA: Diagnosis not present

## 2017-12-07 DIAGNOSIS — E1121 Type 2 diabetes mellitus with diabetic nephropathy: Secondary | ICD-10-CM | POA: Diagnosis not present

## 2017-12-07 DIAGNOSIS — G4733 Obstructive sleep apnea (adult) (pediatric): Secondary | ICD-10-CM | POA: Diagnosis not present

## 2017-12-07 DIAGNOSIS — Z0001 Encounter for general adult medical examination with abnormal findings: Secondary | ICD-10-CM | POA: Diagnosis not present

## 2017-12-07 DIAGNOSIS — Z1389 Encounter for screening for other disorder: Secondary | ICD-10-CM | POA: Diagnosis not present

## 2017-12-07 DIAGNOSIS — I10 Essential (primary) hypertension: Secondary | ICD-10-CM | POA: Diagnosis not present

## 2017-12-11 DIAGNOSIS — L97822 Non-pressure chronic ulcer of other part of left lower leg with fat layer exposed: Secondary | ICD-10-CM | POA: Diagnosis not present

## 2017-12-11 DIAGNOSIS — L97821 Non-pressure chronic ulcer of other part of left lower leg limited to breakdown of skin: Secondary | ICD-10-CM | POA: Diagnosis not present

## 2017-12-11 DIAGNOSIS — I890 Lymphedema, not elsewhere classified: Secondary | ICD-10-CM | POA: Diagnosis not present

## 2017-12-11 DIAGNOSIS — L97819 Non-pressure chronic ulcer of other part of right lower leg with unspecified severity: Secondary | ICD-10-CM | POA: Diagnosis not present

## 2017-12-11 DIAGNOSIS — L97212 Non-pressure chronic ulcer of right calf with fat layer exposed: Secondary | ICD-10-CM | POA: Diagnosis not present

## 2017-12-11 DIAGNOSIS — I10 Essential (primary) hypertension: Secondary | ICD-10-CM | POA: Diagnosis not present

## 2017-12-11 DIAGNOSIS — I87333 Chronic venous hypertension (idiopathic) with ulcer and inflammation of bilateral lower extremity: Secondary | ICD-10-CM | POA: Diagnosis not present

## 2017-12-18 ENCOUNTER — Encounter (HOSPITAL_BASED_OUTPATIENT_CLINIC_OR_DEPARTMENT_OTHER): Payer: Medicare Other | Attending: Internal Medicine

## 2017-12-18 DIAGNOSIS — L97822 Non-pressure chronic ulcer of other part of left lower leg with fat layer exposed: Secondary | ICD-10-CM | POA: Diagnosis not present

## 2017-12-18 DIAGNOSIS — Z6841 Body Mass Index (BMI) 40.0 and over, adult: Secondary | ICD-10-CM | POA: Diagnosis not present

## 2017-12-18 DIAGNOSIS — I890 Lymphedema, not elsewhere classified: Secondary | ICD-10-CM | POA: Diagnosis not present

## 2017-12-18 DIAGNOSIS — I87333 Chronic venous hypertension (idiopathic) with ulcer and inflammation of bilateral lower extremity: Secondary | ICD-10-CM | POA: Insufficient documentation

## 2017-12-18 DIAGNOSIS — L97212 Non-pressure chronic ulcer of right calf with fat layer exposed: Secondary | ICD-10-CM | POA: Diagnosis not present

## 2017-12-18 DIAGNOSIS — I10 Essential (primary) hypertension: Secondary | ICD-10-CM | POA: Diagnosis not present

## 2017-12-18 DIAGNOSIS — L97812 Non-pressure chronic ulcer of other part of right lower leg with fat layer exposed: Secondary | ICD-10-CM | POA: Diagnosis not present

## 2017-12-18 DIAGNOSIS — G4730 Sleep apnea, unspecified: Secondary | ICD-10-CM | POA: Diagnosis not present

## 2017-12-18 DIAGNOSIS — I87313 Chronic venous hypertension (idiopathic) with ulcer of bilateral lower extremity: Secondary | ICD-10-CM | POA: Diagnosis not present

## 2017-12-25 DIAGNOSIS — L97812 Non-pressure chronic ulcer of other part of right lower leg with fat layer exposed: Secondary | ICD-10-CM | POA: Diagnosis not present

## 2017-12-25 DIAGNOSIS — I890 Lymphedema, not elsewhere classified: Secondary | ICD-10-CM | POA: Diagnosis not present

## 2017-12-25 DIAGNOSIS — I10 Essential (primary) hypertension: Secondary | ICD-10-CM | POA: Diagnosis not present

## 2017-12-25 DIAGNOSIS — L03115 Cellulitis of right lower limb: Secondary | ICD-10-CM | POA: Diagnosis not present

## 2017-12-25 DIAGNOSIS — L97822 Non-pressure chronic ulcer of other part of left lower leg with fat layer exposed: Secondary | ICD-10-CM | POA: Diagnosis not present

## 2017-12-25 DIAGNOSIS — L97212 Non-pressure chronic ulcer of right calf with fat layer exposed: Secondary | ICD-10-CM | POA: Diagnosis not present

## 2017-12-25 DIAGNOSIS — I87333 Chronic venous hypertension (idiopathic) with ulcer and inflammation of bilateral lower extremity: Secondary | ICD-10-CM | POA: Diagnosis not present

## 2018-01-01 DIAGNOSIS — L97822 Non-pressure chronic ulcer of other part of left lower leg with fat layer exposed: Secondary | ICD-10-CM | POA: Diagnosis not present

## 2018-01-01 DIAGNOSIS — L97812 Non-pressure chronic ulcer of other part of right lower leg with fat layer exposed: Secondary | ICD-10-CM | POA: Diagnosis not present

## 2018-01-01 DIAGNOSIS — I87333 Chronic venous hypertension (idiopathic) with ulcer and inflammation of bilateral lower extremity: Secondary | ICD-10-CM | POA: Diagnosis not present

## 2018-01-01 DIAGNOSIS — I890 Lymphedema, not elsewhere classified: Secondary | ICD-10-CM | POA: Diagnosis not present

## 2018-01-01 DIAGNOSIS — I10 Essential (primary) hypertension: Secondary | ICD-10-CM | POA: Diagnosis not present

## 2018-01-01 DIAGNOSIS — I87332 Chronic venous hypertension (idiopathic) with ulcer and inflammation of left lower extremity: Secondary | ICD-10-CM | POA: Diagnosis not present

## 2018-01-01 DIAGNOSIS — L97212 Non-pressure chronic ulcer of right calf with fat layer exposed: Secondary | ICD-10-CM | POA: Diagnosis not present

## 2018-01-08 DIAGNOSIS — L97212 Non-pressure chronic ulcer of right calf with fat layer exposed: Secondary | ICD-10-CM | POA: Diagnosis not present

## 2018-01-08 DIAGNOSIS — L97822 Non-pressure chronic ulcer of other part of left lower leg with fat layer exposed: Secondary | ICD-10-CM | POA: Diagnosis not present

## 2018-01-08 DIAGNOSIS — L97222 Non-pressure chronic ulcer of left calf with fat layer exposed: Secondary | ICD-10-CM | POA: Diagnosis not present

## 2018-01-08 DIAGNOSIS — L97812 Non-pressure chronic ulcer of other part of right lower leg with fat layer exposed: Secondary | ICD-10-CM | POA: Diagnosis not present

## 2018-01-08 DIAGNOSIS — I87333 Chronic venous hypertension (idiopathic) with ulcer and inflammation of bilateral lower extremity: Secondary | ICD-10-CM | POA: Diagnosis not present

## 2018-01-08 DIAGNOSIS — I890 Lymphedema, not elsewhere classified: Secondary | ICD-10-CM | POA: Diagnosis not present

## 2018-01-08 DIAGNOSIS — I10 Essential (primary) hypertension: Secondary | ICD-10-CM | POA: Diagnosis not present

## 2018-01-11 DIAGNOSIS — I509 Heart failure, unspecified: Secondary | ICD-10-CM | POA: Diagnosis not present

## 2018-01-11 DIAGNOSIS — L97212 Non-pressure chronic ulcer of right calf with fat layer exposed: Secondary | ICD-10-CM | POA: Diagnosis not present

## 2018-01-11 DIAGNOSIS — L97821 Non-pressure chronic ulcer of other part of left lower leg limited to breakdown of skin: Secondary | ICD-10-CM | POA: Diagnosis not present

## 2018-01-11 DIAGNOSIS — I890 Lymphedema, not elsewhere classified: Secondary | ICD-10-CM | POA: Diagnosis not present

## 2018-01-11 DIAGNOSIS — I87333 Chronic venous hypertension (idiopathic) with ulcer and inflammation of bilateral lower extremity: Secondary | ICD-10-CM | POA: Diagnosis not present

## 2018-01-11 DIAGNOSIS — G4730 Sleep apnea, unspecified: Secondary | ICD-10-CM | POA: Diagnosis not present

## 2018-01-16 ENCOUNTER — Encounter (HOSPITAL_BASED_OUTPATIENT_CLINIC_OR_DEPARTMENT_OTHER): Payer: Medicare Other | Attending: Physician Assistant

## 2018-01-16 DIAGNOSIS — L97819 Non-pressure chronic ulcer of other part of right lower leg with unspecified severity: Secondary | ICD-10-CM | POA: Diagnosis not present

## 2018-01-16 DIAGNOSIS — I87333 Chronic venous hypertension (idiopathic) with ulcer and inflammation of bilateral lower extremity: Secondary | ICD-10-CM | POA: Diagnosis not present

## 2018-01-16 DIAGNOSIS — L97212 Non-pressure chronic ulcer of right calf with fat layer exposed: Secondary | ICD-10-CM | POA: Diagnosis not present

## 2018-01-16 DIAGNOSIS — I5032 Chronic diastolic (congestive) heart failure: Secondary | ICD-10-CM | POA: Diagnosis not present

## 2018-01-16 DIAGNOSIS — G4730 Sleep apnea, unspecified: Secondary | ICD-10-CM | POA: Insufficient documentation

## 2018-01-16 DIAGNOSIS — L97822 Non-pressure chronic ulcer of other part of left lower leg with fat layer exposed: Secondary | ICD-10-CM | POA: Insufficient documentation

## 2018-01-16 DIAGNOSIS — S81801A Unspecified open wound, right lower leg, initial encounter: Secondary | ICD-10-CM | POA: Diagnosis not present

## 2018-01-16 DIAGNOSIS — Z8673 Personal history of transient ischemic attack (TIA), and cerebral infarction without residual deficits: Secondary | ICD-10-CM | POA: Insufficient documentation

## 2018-01-16 DIAGNOSIS — I110 Hypertensive heart disease with heart failure: Secondary | ICD-10-CM | POA: Diagnosis not present

## 2018-01-16 DIAGNOSIS — S81802A Unspecified open wound, left lower leg, initial encounter: Secondary | ICD-10-CM | POA: Diagnosis not present

## 2018-01-16 DIAGNOSIS — L97829 Non-pressure chronic ulcer of other part of left lower leg with unspecified severity: Secondary | ICD-10-CM | POA: Insufficient documentation

## 2018-01-16 DIAGNOSIS — L97219 Non-pressure chronic ulcer of right calf with unspecified severity: Secondary | ICD-10-CM | POA: Diagnosis not present

## 2018-01-16 DIAGNOSIS — I890 Lymphedema, not elsewhere classified: Secondary | ICD-10-CM | POA: Diagnosis not present

## 2018-01-18 DIAGNOSIS — I87333 Chronic venous hypertension (idiopathic) with ulcer and inflammation of bilateral lower extremity: Secondary | ICD-10-CM | POA: Diagnosis not present

## 2018-01-18 DIAGNOSIS — L97212 Non-pressure chronic ulcer of right calf with fat layer exposed: Secondary | ICD-10-CM | POA: Diagnosis not present

## 2018-01-18 DIAGNOSIS — I509 Heart failure, unspecified: Secondary | ICD-10-CM | POA: Diagnosis not present

## 2018-01-18 DIAGNOSIS — L97821 Non-pressure chronic ulcer of other part of left lower leg limited to breakdown of skin: Secondary | ICD-10-CM | POA: Diagnosis not present

## 2018-01-18 DIAGNOSIS — I890 Lymphedema, not elsewhere classified: Secondary | ICD-10-CM | POA: Diagnosis not present

## 2018-01-22 DIAGNOSIS — L97219 Non-pressure chronic ulcer of right calf with unspecified severity: Secondary | ICD-10-CM | POA: Diagnosis not present

## 2018-01-22 DIAGNOSIS — L97822 Non-pressure chronic ulcer of other part of left lower leg with fat layer exposed: Secondary | ICD-10-CM | POA: Diagnosis not present

## 2018-01-22 DIAGNOSIS — L97819 Non-pressure chronic ulcer of other part of right lower leg with unspecified severity: Secondary | ICD-10-CM | POA: Diagnosis not present

## 2018-01-22 DIAGNOSIS — I87333 Chronic venous hypertension (idiopathic) with ulcer and inflammation of bilateral lower extremity: Secondary | ICD-10-CM | POA: Diagnosis not present

## 2018-01-22 DIAGNOSIS — L97812 Non-pressure chronic ulcer of other part of right lower leg with fat layer exposed: Secondary | ICD-10-CM | POA: Diagnosis not present

## 2018-01-22 DIAGNOSIS — I87313 Chronic venous hypertension (idiopathic) with ulcer of bilateral lower extremity: Secondary | ICD-10-CM | POA: Diagnosis not present

## 2018-01-22 DIAGNOSIS — L97829 Non-pressure chronic ulcer of other part of left lower leg with unspecified severity: Secondary | ICD-10-CM | POA: Diagnosis not present

## 2018-01-22 DIAGNOSIS — I890 Lymphedema, not elsewhere classified: Secondary | ICD-10-CM | POA: Diagnosis not present

## 2018-01-22 DIAGNOSIS — L97212 Non-pressure chronic ulcer of right calf with fat layer exposed: Secondary | ICD-10-CM | POA: Diagnosis not present

## 2018-01-25 DIAGNOSIS — I87333 Chronic venous hypertension (idiopathic) with ulcer and inflammation of bilateral lower extremity: Secondary | ICD-10-CM | POA: Diagnosis not present

## 2018-01-25 DIAGNOSIS — L97212 Non-pressure chronic ulcer of right calf with fat layer exposed: Secondary | ICD-10-CM | POA: Diagnosis not present

## 2018-01-25 DIAGNOSIS — L97821 Non-pressure chronic ulcer of other part of left lower leg limited to breakdown of skin: Secondary | ICD-10-CM | POA: Diagnosis not present

## 2018-01-25 DIAGNOSIS — I509 Heart failure, unspecified: Secondary | ICD-10-CM | POA: Diagnosis not present

## 2018-01-25 DIAGNOSIS — I890 Lymphedema, not elsewhere classified: Secondary | ICD-10-CM | POA: Diagnosis not present

## 2018-01-29 DIAGNOSIS — I890 Lymphedema, not elsewhere classified: Secondary | ICD-10-CM | POA: Diagnosis not present

## 2018-01-29 DIAGNOSIS — I87333 Chronic venous hypertension (idiopathic) with ulcer and inflammation of bilateral lower extremity: Secondary | ICD-10-CM | POA: Diagnosis not present

## 2018-01-29 DIAGNOSIS — L97821 Non-pressure chronic ulcer of other part of left lower leg limited to breakdown of skin: Secondary | ICD-10-CM | POA: Diagnosis not present

## 2018-01-29 DIAGNOSIS — I509 Heart failure, unspecified: Secondary | ICD-10-CM | POA: Diagnosis not present

## 2018-01-29 DIAGNOSIS — L97212 Non-pressure chronic ulcer of right calf with fat layer exposed: Secondary | ICD-10-CM | POA: Diagnosis not present

## 2018-02-01 DIAGNOSIS — I87333 Chronic venous hypertension (idiopathic) with ulcer and inflammation of bilateral lower extremity: Secondary | ICD-10-CM | POA: Diagnosis not present

## 2018-02-01 DIAGNOSIS — L97212 Non-pressure chronic ulcer of right calf with fat layer exposed: Secondary | ICD-10-CM | POA: Diagnosis not present

## 2018-02-01 DIAGNOSIS — I509 Heart failure, unspecified: Secondary | ICD-10-CM | POA: Diagnosis not present

## 2018-02-01 DIAGNOSIS — L97821 Non-pressure chronic ulcer of other part of left lower leg limited to breakdown of skin: Secondary | ICD-10-CM | POA: Diagnosis not present

## 2018-02-01 DIAGNOSIS — I890 Lymphedema, not elsewhere classified: Secondary | ICD-10-CM | POA: Diagnosis not present

## 2018-02-05 ENCOUNTER — Other Ambulatory Visit: Payer: Self-pay | Admitting: Cardiology

## 2018-02-08 DIAGNOSIS — I890 Lymphedema, not elsewhere classified: Secondary | ICD-10-CM | POA: Diagnosis not present

## 2018-02-08 DIAGNOSIS — L97212 Non-pressure chronic ulcer of right calf with fat layer exposed: Secondary | ICD-10-CM | POA: Diagnosis not present

## 2018-02-08 DIAGNOSIS — I509 Heart failure, unspecified: Secondary | ICD-10-CM | POA: Diagnosis not present

## 2018-02-08 DIAGNOSIS — I87333 Chronic venous hypertension (idiopathic) with ulcer and inflammation of bilateral lower extremity: Secondary | ICD-10-CM | POA: Diagnosis not present

## 2018-02-08 DIAGNOSIS — L97821 Non-pressure chronic ulcer of other part of left lower leg limited to breakdown of skin: Secondary | ICD-10-CM | POA: Diagnosis not present

## 2018-02-12 DIAGNOSIS — L97822 Non-pressure chronic ulcer of other part of left lower leg with fat layer exposed: Secondary | ICD-10-CM | POA: Diagnosis not present

## 2018-02-12 DIAGNOSIS — I87313 Chronic venous hypertension (idiopathic) with ulcer of bilateral lower extremity: Secondary | ICD-10-CM | POA: Diagnosis not present

## 2018-02-12 DIAGNOSIS — L97812 Non-pressure chronic ulcer of other part of right lower leg with fat layer exposed: Secondary | ICD-10-CM | POA: Diagnosis not present

## 2018-02-12 DIAGNOSIS — L97219 Non-pressure chronic ulcer of right calf with unspecified severity: Secondary | ICD-10-CM | POA: Diagnosis not present

## 2018-02-12 DIAGNOSIS — I890 Lymphedema, not elsewhere classified: Secondary | ICD-10-CM | POA: Diagnosis not present

## 2018-02-15 DIAGNOSIS — L97212 Non-pressure chronic ulcer of right calf with fat layer exposed: Secondary | ICD-10-CM | POA: Diagnosis not present

## 2018-02-15 DIAGNOSIS — I890 Lymphedema, not elsewhere classified: Secondary | ICD-10-CM | POA: Diagnosis not present

## 2018-02-15 DIAGNOSIS — I87333 Chronic venous hypertension (idiopathic) with ulcer and inflammation of bilateral lower extremity: Secondary | ICD-10-CM | POA: Diagnosis not present

## 2018-02-15 DIAGNOSIS — L97821 Non-pressure chronic ulcer of other part of left lower leg limited to breakdown of skin: Secondary | ICD-10-CM | POA: Diagnosis not present

## 2018-02-15 DIAGNOSIS — I509 Heart failure, unspecified: Secondary | ICD-10-CM | POA: Diagnosis not present

## 2018-02-19 DIAGNOSIS — I509 Heart failure, unspecified: Secondary | ICD-10-CM | POA: Diagnosis not present

## 2018-02-19 DIAGNOSIS — L97821 Non-pressure chronic ulcer of other part of left lower leg limited to breakdown of skin: Secondary | ICD-10-CM | POA: Diagnosis not present

## 2018-02-19 DIAGNOSIS — I890 Lymphedema, not elsewhere classified: Secondary | ICD-10-CM | POA: Diagnosis not present

## 2018-02-19 DIAGNOSIS — L97212 Non-pressure chronic ulcer of right calf with fat layer exposed: Secondary | ICD-10-CM | POA: Diagnosis not present

## 2018-02-19 DIAGNOSIS — I87333 Chronic venous hypertension (idiopathic) with ulcer and inflammation of bilateral lower extremity: Secondary | ICD-10-CM | POA: Diagnosis not present

## 2018-02-22 DIAGNOSIS — L97821 Non-pressure chronic ulcer of other part of left lower leg limited to breakdown of skin: Secondary | ICD-10-CM | POA: Diagnosis not present

## 2018-02-22 DIAGNOSIS — L97212 Non-pressure chronic ulcer of right calf with fat layer exposed: Secondary | ICD-10-CM | POA: Diagnosis not present

## 2018-02-22 DIAGNOSIS — I509 Heart failure, unspecified: Secondary | ICD-10-CM | POA: Diagnosis not present

## 2018-02-22 DIAGNOSIS — I87333 Chronic venous hypertension (idiopathic) with ulcer and inflammation of bilateral lower extremity: Secondary | ICD-10-CM | POA: Diagnosis not present

## 2018-02-22 DIAGNOSIS — I890 Lymphedema, not elsewhere classified: Secondary | ICD-10-CM | POA: Diagnosis not present

## 2018-02-26 ENCOUNTER — Encounter (HOSPITAL_BASED_OUTPATIENT_CLINIC_OR_DEPARTMENT_OTHER): Payer: Medicare Other | Attending: Internal Medicine

## 2018-02-26 DIAGNOSIS — I10 Essential (primary) hypertension: Secondary | ICD-10-CM | POA: Diagnosis not present

## 2018-02-26 DIAGNOSIS — L97219 Non-pressure chronic ulcer of right calf with unspecified severity: Secondary | ICD-10-CM | POA: Diagnosis not present

## 2018-02-26 DIAGNOSIS — L97822 Non-pressure chronic ulcer of other part of left lower leg with fat layer exposed: Secondary | ICD-10-CM | POA: Diagnosis not present

## 2018-02-26 DIAGNOSIS — I890 Lymphedema, not elsewhere classified: Secondary | ICD-10-CM | POA: Diagnosis not present

## 2018-02-26 DIAGNOSIS — L97212 Non-pressure chronic ulcer of right calf with fat layer exposed: Secondary | ICD-10-CM | POA: Diagnosis not present

## 2018-02-26 DIAGNOSIS — L97229 Non-pressure chronic ulcer of left calf with unspecified severity: Secondary | ICD-10-CM | POA: Diagnosis not present

## 2018-02-26 DIAGNOSIS — G4730 Sleep apnea, unspecified: Secondary | ICD-10-CM | POA: Insufficient documentation

## 2018-02-26 DIAGNOSIS — I872 Venous insufficiency (chronic) (peripheral): Secondary | ICD-10-CM | POA: Diagnosis not present

## 2018-02-27 DIAGNOSIS — I10 Essential (primary) hypertension: Secondary | ICD-10-CM | POA: Diagnosis not present

## 2018-02-27 DIAGNOSIS — I6930 Unspecified sequelae of cerebral infarction: Secondary | ICD-10-CM | POA: Diagnosis not present

## 2018-02-27 DIAGNOSIS — Z6841 Body Mass Index (BMI) 40.0 and over, adult: Secondary | ICD-10-CM | POA: Diagnosis not present

## 2018-02-27 DIAGNOSIS — I5032 Chronic diastolic (congestive) heart failure: Secondary | ICD-10-CM | POA: Diagnosis not present

## 2018-02-27 DIAGNOSIS — E7800 Pure hypercholesterolemia, unspecified: Secondary | ICD-10-CM | POA: Diagnosis not present

## 2018-02-27 DIAGNOSIS — E1121 Type 2 diabetes mellitus with diabetic nephropathy: Secondary | ICD-10-CM | POA: Diagnosis not present

## 2018-02-27 DIAGNOSIS — E1129 Type 2 diabetes mellitus with other diabetic kidney complication: Secondary | ICD-10-CM | POA: Diagnosis not present

## 2018-03-01 DIAGNOSIS — L97821 Non-pressure chronic ulcer of other part of left lower leg limited to breakdown of skin: Secondary | ICD-10-CM | POA: Diagnosis not present

## 2018-03-01 DIAGNOSIS — L97212 Non-pressure chronic ulcer of right calf with fat layer exposed: Secondary | ICD-10-CM | POA: Diagnosis not present

## 2018-03-01 DIAGNOSIS — I509 Heart failure, unspecified: Secondary | ICD-10-CM | POA: Diagnosis not present

## 2018-03-01 DIAGNOSIS — I87333 Chronic venous hypertension (idiopathic) with ulcer and inflammation of bilateral lower extremity: Secondary | ICD-10-CM | POA: Diagnosis not present

## 2018-03-01 DIAGNOSIS — I890 Lymphedema, not elsewhere classified: Secondary | ICD-10-CM | POA: Diagnosis not present

## 2018-03-05 DIAGNOSIS — L97821 Non-pressure chronic ulcer of other part of left lower leg limited to breakdown of skin: Secondary | ICD-10-CM | POA: Diagnosis not present

## 2018-03-05 DIAGNOSIS — I87333 Chronic venous hypertension (idiopathic) with ulcer and inflammation of bilateral lower extremity: Secondary | ICD-10-CM | POA: Diagnosis not present

## 2018-03-05 DIAGNOSIS — I509 Heart failure, unspecified: Secondary | ICD-10-CM | POA: Diagnosis not present

## 2018-03-05 DIAGNOSIS — L97212 Non-pressure chronic ulcer of right calf with fat layer exposed: Secondary | ICD-10-CM | POA: Diagnosis not present

## 2018-03-05 DIAGNOSIS — I890 Lymphedema, not elsewhere classified: Secondary | ICD-10-CM | POA: Diagnosis not present

## 2018-03-08 ENCOUNTER — Other Ambulatory Visit: Payer: Self-pay | Admitting: Cardiology

## 2018-03-08 DIAGNOSIS — I509 Heart failure, unspecified: Secondary | ICD-10-CM | POA: Diagnosis not present

## 2018-03-08 DIAGNOSIS — L97821 Non-pressure chronic ulcer of other part of left lower leg limited to breakdown of skin: Secondary | ICD-10-CM | POA: Diagnosis not present

## 2018-03-08 DIAGNOSIS — I87333 Chronic venous hypertension (idiopathic) with ulcer and inflammation of bilateral lower extremity: Secondary | ICD-10-CM | POA: Diagnosis not present

## 2018-03-08 DIAGNOSIS — L97212 Non-pressure chronic ulcer of right calf with fat layer exposed: Secondary | ICD-10-CM | POA: Diagnosis not present

## 2018-03-08 DIAGNOSIS — I890 Lymphedema, not elsewhere classified: Secondary | ICD-10-CM | POA: Diagnosis not present

## 2018-03-12 DIAGNOSIS — L97229 Non-pressure chronic ulcer of left calf with unspecified severity: Secondary | ICD-10-CM | POA: Diagnosis not present

## 2018-03-12 DIAGNOSIS — L97219 Non-pressure chronic ulcer of right calf with unspecified severity: Secondary | ICD-10-CM | POA: Diagnosis not present

## 2018-03-12 DIAGNOSIS — L97822 Non-pressure chronic ulcer of other part of left lower leg with fat layer exposed: Secondary | ICD-10-CM | POA: Diagnosis not present

## 2018-03-12 DIAGNOSIS — L97212 Non-pressure chronic ulcer of right calf with fat layer exposed: Secondary | ICD-10-CM | POA: Diagnosis not present

## 2018-03-12 DIAGNOSIS — I87333 Chronic venous hypertension (idiopathic) with ulcer and inflammation of bilateral lower extremity: Secondary | ICD-10-CM | POA: Diagnosis not present

## 2018-03-12 DIAGNOSIS — G4730 Sleep apnea, unspecified: Secondary | ICD-10-CM | POA: Diagnosis not present

## 2018-03-12 DIAGNOSIS — L97821 Non-pressure chronic ulcer of other part of left lower leg limited to breakdown of skin: Secondary | ICD-10-CM | POA: Diagnosis not present

## 2018-03-12 DIAGNOSIS — I509 Heart failure, unspecified: Secondary | ICD-10-CM | POA: Diagnosis not present

## 2018-03-12 DIAGNOSIS — I890 Lymphedema, not elsewhere classified: Secondary | ICD-10-CM | POA: Diagnosis not present

## 2018-03-12 DIAGNOSIS — I10 Essential (primary) hypertension: Secondary | ICD-10-CM | POA: Diagnosis not present

## 2018-03-15 DIAGNOSIS — I87333 Chronic venous hypertension (idiopathic) with ulcer and inflammation of bilateral lower extremity: Secondary | ICD-10-CM | POA: Diagnosis not present

## 2018-03-15 DIAGNOSIS — I509 Heart failure, unspecified: Secondary | ICD-10-CM | POA: Diagnosis not present

## 2018-03-15 DIAGNOSIS — L97212 Non-pressure chronic ulcer of right calf with fat layer exposed: Secondary | ICD-10-CM | POA: Diagnosis not present

## 2018-03-15 DIAGNOSIS — L97821 Non-pressure chronic ulcer of other part of left lower leg limited to breakdown of skin: Secondary | ICD-10-CM | POA: Diagnosis not present

## 2018-03-15 DIAGNOSIS — I890 Lymphedema, not elsewhere classified: Secondary | ICD-10-CM | POA: Diagnosis not present

## 2018-03-19 DIAGNOSIS — L97212 Non-pressure chronic ulcer of right calf with fat layer exposed: Secondary | ICD-10-CM | POA: Diagnosis not present

## 2018-03-19 DIAGNOSIS — L97821 Non-pressure chronic ulcer of other part of left lower leg limited to breakdown of skin: Secondary | ICD-10-CM | POA: Diagnosis not present

## 2018-03-19 DIAGNOSIS — I890 Lymphedema, not elsewhere classified: Secondary | ICD-10-CM | POA: Diagnosis not present

## 2018-03-19 DIAGNOSIS — I87333 Chronic venous hypertension (idiopathic) with ulcer and inflammation of bilateral lower extremity: Secondary | ICD-10-CM | POA: Diagnosis not present

## 2018-03-19 DIAGNOSIS — I509 Heart failure, unspecified: Secondary | ICD-10-CM | POA: Diagnosis not present

## 2018-03-22 DIAGNOSIS — I890 Lymphedema, not elsewhere classified: Secondary | ICD-10-CM | POA: Diagnosis not present

## 2018-03-22 DIAGNOSIS — L97821 Non-pressure chronic ulcer of other part of left lower leg limited to breakdown of skin: Secondary | ICD-10-CM | POA: Diagnosis not present

## 2018-03-22 DIAGNOSIS — I87333 Chronic venous hypertension (idiopathic) with ulcer and inflammation of bilateral lower extremity: Secondary | ICD-10-CM | POA: Diagnosis not present

## 2018-03-22 DIAGNOSIS — I509 Heart failure, unspecified: Secondary | ICD-10-CM | POA: Diagnosis not present

## 2018-03-22 DIAGNOSIS — L97212 Non-pressure chronic ulcer of right calf with fat layer exposed: Secondary | ICD-10-CM | POA: Diagnosis not present

## 2018-03-26 ENCOUNTER — Encounter (HOSPITAL_BASED_OUTPATIENT_CLINIC_OR_DEPARTMENT_OTHER): Payer: Medicare Other | Attending: Internal Medicine

## 2018-03-26 DIAGNOSIS — L97819 Non-pressure chronic ulcer of other part of right lower leg with unspecified severity: Secondary | ICD-10-CM | POA: Diagnosis not present

## 2018-03-26 DIAGNOSIS — I10 Essential (primary) hypertension: Secondary | ICD-10-CM | POA: Diagnosis not present

## 2018-03-26 DIAGNOSIS — L97822 Non-pressure chronic ulcer of other part of left lower leg with fat layer exposed: Secondary | ICD-10-CM | POA: Diagnosis not present

## 2018-03-26 DIAGNOSIS — I87333 Chronic venous hypertension (idiopathic) with ulcer and inflammation of bilateral lower extremity: Secondary | ICD-10-CM | POA: Diagnosis not present

## 2018-03-26 DIAGNOSIS — G4730 Sleep apnea, unspecified: Secondary | ICD-10-CM | POA: Insufficient documentation

## 2018-03-26 DIAGNOSIS — L97212 Non-pressure chronic ulcer of right calf with fat layer exposed: Secondary | ICD-10-CM | POA: Diagnosis not present

## 2018-03-26 DIAGNOSIS — I87313 Chronic venous hypertension (idiopathic) with ulcer of bilateral lower extremity: Secondary | ICD-10-CM | POA: Diagnosis not present

## 2018-03-26 DIAGNOSIS — I890 Lymphedema, not elsewhere classified: Secondary | ICD-10-CM | POA: Diagnosis not present

## 2018-03-29 DIAGNOSIS — I87333 Chronic venous hypertension (idiopathic) with ulcer and inflammation of bilateral lower extremity: Secondary | ICD-10-CM | POA: Diagnosis not present

## 2018-03-29 DIAGNOSIS — I509 Heart failure, unspecified: Secondary | ICD-10-CM | POA: Diagnosis not present

## 2018-03-29 DIAGNOSIS — L97212 Non-pressure chronic ulcer of right calf with fat layer exposed: Secondary | ICD-10-CM | POA: Diagnosis not present

## 2018-03-29 DIAGNOSIS — L97821 Non-pressure chronic ulcer of other part of left lower leg limited to breakdown of skin: Secondary | ICD-10-CM | POA: Diagnosis not present

## 2018-03-29 DIAGNOSIS — I890 Lymphedema, not elsewhere classified: Secondary | ICD-10-CM | POA: Diagnosis not present

## 2018-04-02 DIAGNOSIS — I509 Heart failure, unspecified: Secondary | ICD-10-CM | POA: Diagnosis not present

## 2018-04-02 DIAGNOSIS — I87333 Chronic venous hypertension (idiopathic) with ulcer and inflammation of bilateral lower extremity: Secondary | ICD-10-CM | POA: Diagnosis not present

## 2018-04-02 DIAGNOSIS — L97212 Non-pressure chronic ulcer of right calf with fat layer exposed: Secondary | ICD-10-CM | POA: Diagnosis not present

## 2018-04-02 DIAGNOSIS — I890 Lymphedema, not elsewhere classified: Secondary | ICD-10-CM | POA: Diagnosis not present

## 2018-04-02 DIAGNOSIS — L97821 Non-pressure chronic ulcer of other part of left lower leg limited to breakdown of skin: Secondary | ICD-10-CM | POA: Diagnosis not present

## 2018-04-05 DIAGNOSIS — I509 Heart failure, unspecified: Secondary | ICD-10-CM | POA: Diagnosis not present

## 2018-04-05 DIAGNOSIS — I87333 Chronic venous hypertension (idiopathic) with ulcer and inflammation of bilateral lower extremity: Secondary | ICD-10-CM | POA: Diagnosis not present

## 2018-04-05 DIAGNOSIS — L97821 Non-pressure chronic ulcer of other part of left lower leg limited to breakdown of skin: Secondary | ICD-10-CM | POA: Diagnosis not present

## 2018-04-05 DIAGNOSIS — L97212 Non-pressure chronic ulcer of right calf with fat layer exposed: Secondary | ICD-10-CM | POA: Diagnosis not present

## 2018-04-05 DIAGNOSIS — I890 Lymphedema, not elsewhere classified: Secondary | ICD-10-CM | POA: Diagnosis not present

## 2018-04-08 DIAGNOSIS — E1121 Type 2 diabetes mellitus with diabetic nephropathy: Secondary | ICD-10-CM | POA: Diagnosis not present

## 2018-04-08 DIAGNOSIS — I10 Essential (primary) hypertension: Secondary | ICD-10-CM | POA: Diagnosis not present

## 2018-04-08 DIAGNOSIS — E7800 Pure hypercholesterolemia, unspecified: Secondary | ICD-10-CM | POA: Diagnosis not present

## 2018-04-08 DIAGNOSIS — I5032 Chronic diastolic (congestive) heart failure: Secondary | ICD-10-CM | POA: Diagnosis not present

## 2018-04-09 DIAGNOSIS — L97819 Non-pressure chronic ulcer of other part of right lower leg with unspecified severity: Secondary | ICD-10-CM | POA: Diagnosis not present

## 2018-04-09 DIAGNOSIS — I87333 Chronic venous hypertension (idiopathic) with ulcer and inflammation of bilateral lower extremity: Secondary | ICD-10-CM | POA: Diagnosis not present

## 2018-04-09 DIAGNOSIS — L97822 Non-pressure chronic ulcer of other part of left lower leg with fat layer exposed: Secondary | ICD-10-CM | POA: Diagnosis not present

## 2018-04-09 DIAGNOSIS — I10 Essential (primary) hypertension: Secondary | ICD-10-CM | POA: Diagnosis not present

## 2018-04-09 DIAGNOSIS — G4730 Sleep apnea, unspecified: Secondary | ICD-10-CM | POA: Diagnosis not present

## 2018-04-09 DIAGNOSIS — I890 Lymphedema, not elsewhere classified: Secondary | ICD-10-CM | POA: Diagnosis not present

## 2018-04-09 DIAGNOSIS — L97212 Non-pressure chronic ulcer of right calf with fat layer exposed: Secondary | ICD-10-CM | POA: Diagnosis not present

## 2018-04-12 DIAGNOSIS — I10 Essential (primary) hypertension: Secondary | ICD-10-CM | POA: Diagnosis not present

## 2018-04-12 DIAGNOSIS — E1121 Type 2 diabetes mellitus with diabetic nephropathy: Secondary | ICD-10-CM | POA: Diagnosis not present

## 2018-04-12 DIAGNOSIS — Z6841 Body Mass Index (BMI) 40.0 and over, adult: Secondary | ICD-10-CM | POA: Diagnosis not present

## 2018-04-12 DIAGNOSIS — R296 Repeated falls: Secondary | ICD-10-CM | POA: Diagnosis not present

## 2018-04-12 DIAGNOSIS — L97212 Non-pressure chronic ulcer of right calf with fat layer exposed: Secondary | ICD-10-CM | POA: Diagnosis not present

## 2018-04-12 DIAGNOSIS — L97821 Non-pressure chronic ulcer of other part of left lower leg limited to breakdown of skin: Secondary | ICD-10-CM | POA: Diagnosis not present

## 2018-04-12 DIAGNOSIS — I509 Heart failure, unspecified: Secondary | ICD-10-CM | POA: Diagnosis not present

## 2018-04-12 DIAGNOSIS — G4733 Obstructive sleep apnea (adult) (pediatric): Secondary | ICD-10-CM | POA: Diagnosis not present

## 2018-04-12 DIAGNOSIS — I87333 Chronic venous hypertension (idiopathic) with ulcer and inflammation of bilateral lower extremity: Secondary | ICD-10-CM | POA: Diagnosis not present

## 2018-04-12 DIAGNOSIS — E7800 Pure hypercholesterolemia, unspecified: Secondary | ICD-10-CM | POA: Diagnosis not present

## 2018-04-12 DIAGNOSIS — E1129 Type 2 diabetes mellitus with other diabetic kidney complication: Secondary | ICD-10-CM | POA: Diagnosis not present

## 2018-04-12 DIAGNOSIS — I890 Lymphedema, not elsewhere classified: Secondary | ICD-10-CM | POA: Diagnosis not present

## 2018-04-16 DIAGNOSIS — I509 Heart failure, unspecified: Secondary | ICD-10-CM | POA: Diagnosis not present

## 2018-04-16 DIAGNOSIS — L97212 Non-pressure chronic ulcer of right calf with fat layer exposed: Secondary | ICD-10-CM | POA: Diagnosis not present

## 2018-04-16 DIAGNOSIS — I87333 Chronic venous hypertension (idiopathic) with ulcer and inflammation of bilateral lower extremity: Secondary | ICD-10-CM | POA: Diagnosis not present

## 2018-04-16 DIAGNOSIS — L97821 Non-pressure chronic ulcer of other part of left lower leg limited to breakdown of skin: Secondary | ICD-10-CM | POA: Diagnosis not present

## 2018-04-16 DIAGNOSIS — I890 Lymphedema, not elsewhere classified: Secondary | ICD-10-CM | POA: Diagnosis not present

## 2018-04-19 DIAGNOSIS — L97212 Non-pressure chronic ulcer of right calf with fat layer exposed: Secondary | ICD-10-CM | POA: Diagnosis not present

## 2018-04-19 DIAGNOSIS — I509 Heart failure, unspecified: Secondary | ICD-10-CM | POA: Diagnosis not present

## 2018-04-19 DIAGNOSIS — L97821 Non-pressure chronic ulcer of other part of left lower leg limited to breakdown of skin: Secondary | ICD-10-CM | POA: Diagnosis not present

## 2018-04-19 DIAGNOSIS — I890 Lymphedema, not elsewhere classified: Secondary | ICD-10-CM | POA: Diagnosis not present

## 2018-04-19 DIAGNOSIS — I87333 Chronic venous hypertension (idiopathic) with ulcer and inflammation of bilateral lower extremity: Secondary | ICD-10-CM | POA: Diagnosis not present

## 2018-04-23 DIAGNOSIS — I509 Heart failure, unspecified: Secondary | ICD-10-CM | POA: Diagnosis not present

## 2018-04-23 DIAGNOSIS — I87333 Chronic venous hypertension (idiopathic) with ulcer and inflammation of bilateral lower extremity: Secondary | ICD-10-CM | POA: Diagnosis not present

## 2018-04-23 DIAGNOSIS — L97821 Non-pressure chronic ulcer of other part of left lower leg limited to breakdown of skin: Secondary | ICD-10-CM | POA: Diagnosis not present

## 2018-04-23 DIAGNOSIS — I890 Lymphedema, not elsewhere classified: Secondary | ICD-10-CM | POA: Diagnosis not present

## 2018-04-23 DIAGNOSIS — L97212 Non-pressure chronic ulcer of right calf with fat layer exposed: Secondary | ICD-10-CM | POA: Diagnosis not present

## 2018-04-26 ENCOUNTER — Encounter (HOSPITAL_BASED_OUTPATIENT_CLINIC_OR_DEPARTMENT_OTHER): Payer: Medicare Other | Attending: Internal Medicine

## 2018-04-26 DIAGNOSIS — L97212 Non-pressure chronic ulcer of right calf with fat layer exposed: Secondary | ICD-10-CM | POA: Diagnosis not present

## 2018-04-26 DIAGNOSIS — G4730 Sleep apnea, unspecified: Secondary | ICD-10-CM | POA: Diagnosis not present

## 2018-04-26 DIAGNOSIS — I10 Essential (primary) hypertension: Secondary | ICD-10-CM | POA: Diagnosis not present

## 2018-04-26 DIAGNOSIS — L03116 Cellulitis of left lower limb: Secondary | ICD-10-CM | POA: Diagnosis not present

## 2018-04-26 DIAGNOSIS — L97819 Non-pressure chronic ulcer of other part of right lower leg with unspecified severity: Secondary | ICD-10-CM | POA: Diagnosis not present

## 2018-04-26 DIAGNOSIS — L97822 Non-pressure chronic ulcer of other part of left lower leg with fat layer exposed: Secondary | ICD-10-CM | POA: Insufficient documentation

## 2018-04-26 DIAGNOSIS — I87323 Chronic venous hypertension (idiopathic) with inflammation of bilateral lower extremity: Secondary | ICD-10-CM | POA: Diagnosis not present

## 2018-04-26 DIAGNOSIS — I890 Lymphedema, not elsewhere classified: Secondary | ICD-10-CM | POA: Diagnosis not present

## 2018-04-26 DIAGNOSIS — I87333 Chronic venous hypertension (idiopathic) with ulcer and inflammation of bilateral lower extremity: Secondary | ICD-10-CM | POA: Diagnosis not present

## 2018-04-30 DIAGNOSIS — I87333 Chronic venous hypertension (idiopathic) with ulcer and inflammation of bilateral lower extremity: Secondary | ICD-10-CM | POA: Diagnosis not present

## 2018-04-30 DIAGNOSIS — L97212 Non-pressure chronic ulcer of right calf with fat layer exposed: Secondary | ICD-10-CM | POA: Diagnosis not present

## 2018-04-30 DIAGNOSIS — I890 Lymphedema, not elsewhere classified: Secondary | ICD-10-CM | POA: Diagnosis not present

## 2018-04-30 DIAGNOSIS — I509 Heart failure, unspecified: Secondary | ICD-10-CM | POA: Diagnosis not present

## 2018-04-30 DIAGNOSIS — L97821 Non-pressure chronic ulcer of other part of left lower leg limited to breakdown of skin: Secondary | ICD-10-CM | POA: Diagnosis not present

## 2018-05-03 DIAGNOSIS — I509 Heart failure, unspecified: Secondary | ICD-10-CM | POA: Diagnosis not present

## 2018-05-03 DIAGNOSIS — I87333 Chronic venous hypertension (idiopathic) with ulcer and inflammation of bilateral lower extremity: Secondary | ICD-10-CM | POA: Diagnosis not present

## 2018-05-03 DIAGNOSIS — L97212 Non-pressure chronic ulcer of right calf with fat layer exposed: Secondary | ICD-10-CM | POA: Diagnosis not present

## 2018-05-03 DIAGNOSIS — I890 Lymphedema, not elsewhere classified: Secondary | ICD-10-CM | POA: Diagnosis not present

## 2018-05-03 DIAGNOSIS — L97821 Non-pressure chronic ulcer of other part of left lower leg limited to breakdown of skin: Secondary | ICD-10-CM | POA: Diagnosis not present

## 2018-05-07 DIAGNOSIS — L97821 Non-pressure chronic ulcer of other part of left lower leg limited to breakdown of skin: Secondary | ICD-10-CM | POA: Diagnosis not present

## 2018-05-07 DIAGNOSIS — I890 Lymphedema, not elsewhere classified: Secondary | ICD-10-CM | POA: Diagnosis not present

## 2018-05-07 DIAGNOSIS — I87333 Chronic venous hypertension (idiopathic) with ulcer and inflammation of bilateral lower extremity: Secondary | ICD-10-CM | POA: Diagnosis not present

## 2018-05-07 DIAGNOSIS — I509 Heart failure, unspecified: Secondary | ICD-10-CM | POA: Diagnosis not present

## 2018-05-07 DIAGNOSIS — L97212 Non-pressure chronic ulcer of right calf with fat layer exposed: Secondary | ICD-10-CM | POA: Diagnosis not present

## 2018-05-09 NOTE — Progress Notes (Signed)
Cardiology Office Note  Date: 05/10/2018   ID: [name] [name], [name] [name] [birth year], MRN [id number]  PCP: Manon Hilding, MD  Primary Cardiologist: Rozann Lesches, MD   Chief Complaint  Patient presents with  . Chronic diastolic heart failure    History of Present Illness: [name] [name] is a 62 y.o. male not seen since July 2017.  He presents overdue for follow-up.  He continues to follow in the wound center in [place].  He has chronic lymphedema and leg draining home health does leg wraps and he also has a pumping device at home.  He reports no increasing shortness of breath.  He is also been trying to lose some weight by cutting back portions and carbohydrates.  I went over his medications which are outlined below.  Hydralazine has been uptitrated by Dr. Quintin Alto.  He states that he is not always consistent with his Lasix.  I personally reviewed his ECG today which shows sinus rhythm with prolonged PR interval and PVCs.  Last echocardiogram was in 2016.  We discussed obtaining a follow-up study.  Past Medical History:  Diagnosis Date  . 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 Essential hypertension   . History of nephrolithiasis   . History of open leg wound   . History of stroke July 2004  . Obstructive sleep apnea   . TIA (transient ischemic attack)   . Varicose veins    Laser ablation of left great saphenous vein 2009    Past Surgical History:  Procedure Laterality Date  . CYSTOSCOPY WITH RETROGRADE PYELOGRAM, URETEROSCOPY AND STENT PLACEMENT Right 08/02/2015   Procedure: CYSTOSCOPY WITH RETROGRADE PYELOGRAM, URETEROSCOPY , STONE EXTRACTION AND STENT PLACEMENT;  Surgeon: Cleon Gustin, MD;  Location: [facility];  Service: Urology;  Laterality: Right;  . HOLMIUM LASER APPLICATION Right 35/70/1779   Procedure: HOLMIUM LASER APPLICATION;  Surgeon: Cleon Gustin, MD;  Location: [facility];  Service: Urology;  Laterality: Right;  . KIDNEY STONE SURGERY    . VASECTOMY  1984  . VEIN  SURGERY Left 06/2010   Laser ablation    Current Outpatient Medications  Medication Sig Dispense Refill  . acetaminophen (TYLENOL) 500 MG tablet Take 1,000 mg by mouth daily as needed (pain).    Marland Kitchen allopurinol (ZYLOPRIM) 300 MG tablet Take 300 mg by mouth daily.    . Ascorbic Acid (VITAMIN C) 1000 MG tablet Take 6,000 mg by mouth daily as needed (when a cold is coming on.).    Marland Kitchen atorvastatin (LIPITOR) 10 MG tablet Take 10 mg by mouth daily.    . clopidogrel (PLAVIX) 75 MG tablet Take 75 mg by mouth daily.    . furosemide (LASIX) 40 MG tablet Take 40 mg by mouth 2 (two) times daily.     . hydrALAZINE (APRESOLINE) 50 MG tablet TAKE 1 TABLET BY MOUTH TWICE A DAY. NEED OFFICE VISIT FOR FURTHER REFILLS (Patient taking differently: Take 50 mg by mouth 3 (three) times daily. Increased by Dr. Quintin Alto) 14 tablet 0  . ibuprofen (ADVIL,MOTRIN) 200 MG tablet Take 400-600 mg by mouth daily as needed (pain).     . metoprolol succinate (TOPROL-XL) 100 MG 24 hr tablet Take 100 mg by mouth daily. Take with or immediately following a meal.    . olmesartan-hydrochlorothiazide (BENICAR HCT) 40-25 MG tablet Take 1 tablet by mouth daily.  12  . aspirin EC 81 MG tablet Take 1 tablet (81 mg total) by mouth daily. 90 tablet 3   No current facility-administered medications  for this visit.    Allergies:  Patient has no known allergies.   Social History: The patient  reports that he has never smoked. He has never used smokeless tobacco. He reports that he does not drink alcohol or use drugs.   ROS:  Please see the history of present illness. Otherwise, complete review of systems is positive for hearing loss.  All other systems are reviewed and negative.   Physical Exam: VS:  BP (!) 169/69   Pulse (!) 57   Ht 5' 10.5" (1.791 m)   Wt (!) 345 lb 3.2 oz (156.6 kg)   SpO2 94%   BMI 48.83 kg/m , BMI Body mass index is 48.83 kg/m.  Wt Readings from Last 3 Encounters:  05/10/18 (!) 345 lb 3.2 oz (156.6 kg)  05/25/16  (!) 371 lb 9.6 oz (168.6 kg)  04/14/16 (!) 368 lb (166.9 kg)    General: Morbidly obese male, using a cane. HEENT: Conjunctiva and lids normal, oropharynx clear. Neck: Supple, no elevated JVP or carotid bruits, no thyromegaly. Lungs: Decreased breath sounds without wheezing, nonlabored breathing at rest. Cardiac: Regular rate and rhythm, no S3, soft systolic murmur. Abdomen: Obese, nontender, bowel sounds present.. Extremities: Chronic appearing lymphedema bilaterally, lower legs are wrapped. Skin: Warm and dry. Musculoskeletal: No kyphosis. Neuropsychiatric: Alert and oriented x3, affect grossly appropriate.  ECG: I personally reviewed the tracing from 05/31/2016 which showed sinus rhythm with nonspecific T wave changes.  Recent Labwork:  September 2017: BUN 21, creatinine 1.07, AST 24, ALT 33, potassium 3.6, hemoglobin 13.8, platelets 190  Other Studies Reviewed Today:  Echocardiogram 09/25/2014 [facility]): Moderate LVH with LVEF 60-65%, abnormal diastolic function, no major valvular abnormalities.  Assessment and Plan:  1.  Chronic diastolic heart failure.  Plan to continue current cardiac regimen.  I reinforced regular use of diuretics.  Follow-up echocardiogram will be obtained to reassess cardiac structure and function.  2.  Chronic lymphedema.  He continues to follow in the wound clinic in [place].  Also does manual compression at home.  3.  Essential hypertension, continue to follow with Dr. Quintin Alto.  No changes made to current regimen.  Current medicines were reviewed with the patient today.   Orders Placed This Encounter  Procedures  . EKG 12-Lead  . ECHOCARDIOGRAM COMPLETE    Disposition: Follow-up in 1 year.  Signed, Satira Sark, MD, [facility] 05/10/2018 4:08 PM    [facility] [place], [place], [place] [address] Phone: [phone]; Fax: [phone]

## 2018-05-10 ENCOUNTER — Telehealth: Payer: Self-pay | Admitting: Cardiology

## 2018-05-10 ENCOUNTER — Encounter: Payer: Self-pay | Admitting: Cardiology

## 2018-05-10 ENCOUNTER — Ambulatory Visit (INDEPENDENT_AMBULATORY_CARE_PROVIDER_SITE_OTHER): Payer: Medicare Other | Admitting: Cardiology

## 2018-05-10 VITALS — BP 169/69 | HR 57 | Ht 70.5 in | Wt 345.2 lb

## 2018-05-10 DIAGNOSIS — I10 Essential (primary) hypertension: Secondary | ICD-10-CM

## 2018-05-10 DIAGNOSIS — E1129 Type 2 diabetes mellitus with other diabetic kidney complication: Secondary | ICD-10-CM | POA: Diagnosis not present

## 2018-05-10 DIAGNOSIS — I87333 Chronic venous hypertension (idiopathic) with ulcer and inflammation of bilateral lower extremity: Secondary | ICD-10-CM | POA: Diagnosis not present

## 2018-05-10 DIAGNOSIS — I5032 Chronic diastolic (congestive) heart failure: Secondary | ICD-10-CM

## 2018-05-10 DIAGNOSIS — E1121 Type 2 diabetes mellitus with diabetic nephropathy: Secondary | ICD-10-CM | POA: Diagnosis not present

## 2018-05-10 DIAGNOSIS — Z6841 Body Mass Index (BMI) 40.0 and over, adult: Secondary | ICD-10-CM | POA: Diagnosis not present

## 2018-05-10 DIAGNOSIS — L97212 Non-pressure chronic ulcer of right calf with fat layer exposed: Secondary | ICD-10-CM | POA: Diagnosis not present

## 2018-05-10 DIAGNOSIS — E7800 Pure hypercholesterolemia, unspecified: Secondary | ICD-10-CM | POA: Diagnosis not present

## 2018-05-10 DIAGNOSIS — I890 Lymphedema, not elsewhere classified: Secondary | ICD-10-CM

## 2018-05-10 DIAGNOSIS — I509 Heart failure, unspecified: Secondary | ICD-10-CM | POA: Diagnosis not present

## 2018-05-10 DIAGNOSIS — I69398 Other sequelae of cerebral infarction: Secondary | ICD-10-CM | POA: Diagnosis not present

## 2018-05-10 DIAGNOSIS — L97821 Non-pressure chronic ulcer of other part of left lower leg limited to breakdown of skin: Secondary | ICD-10-CM | POA: Diagnosis not present

## 2018-05-10 DIAGNOSIS — R296 Repeated falls: Secondary | ICD-10-CM | POA: Diagnosis not present

## 2018-05-10 MED ORDER — ASPIRIN EC 81 MG PO TBEC: 81.0000 mg | DELAYED_RELEASE_TABLET | Freq: Every day | ORAL | 3 refills | Status: AC

## 2018-05-10 NOTE — Telephone Encounter (Signed)
°  Precert needed for:  2 D Echo dx: diastolic heart failure   Location: [facility]    Date: SEPT 5, 2019

## 2018-05-10 NOTE — Patient Instructions (Addendum)
Medication Instructions:   Your physician has recommended you make the following change in your medication:   Decrease aspirin to 81 mg by mouth daily.  Continue all other medications the same.  Labwork:  NONE  Testing/Procedures: Your physician has requested that you have an echocardiogram. Echocardiography is a painless test that uses sound waves to create images of your heart. It provides your doctor with information about the size and shape of your heart and how well your heart's chambers and valves are working. This procedure takes approximately one hour. There are no restrictions for this procedure.  Follow-Up:  Your physician recommends that you schedule a follow-up appointment in: 1 year. You will receive a reminder letter in the mail in about 10 months reminding you to call and schedule your appointment. If you don't receive this letter, please contact our office.  Any Other Special Instructions Will Be Listed Below (If Applicable).  If you need a refill on your cardiac medications before your next appointment, please call your pharmacy. 

## 2018-05-11 DIAGNOSIS — I890 Lymphedema, not elsewhere classified: Secondary | ICD-10-CM | POA: Diagnosis not present

## 2018-05-11 DIAGNOSIS — L97821 Non-pressure chronic ulcer of other part of left lower leg limited to breakdown of skin: Secondary | ICD-10-CM | POA: Diagnosis not present

## 2018-05-11 DIAGNOSIS — G4730 Sleep apnea, unspecified: Secondary | ICD-10-CM | POA: Diagnosis not present

## 2018-05-11 DIAGNOSIS — I509 Heart failure, unspecified: Secondary | ICD-10-CM | POA: Diagnosis not present

## 2018-05-11 DIAGNOSIS — L97212 Non-pressure chronic ulcer of right calf with fat layer exposed: Secondary | ICD-10-CM | POA: Diagnosis not present

## 2018-05-11 DIAGNOSIS — I87333 Chronic venous hypertension (idiopathic) with ulcer and inflammation of bilateral lower extremity: Secondary | ICD-10-CM | POA: Diagnosis not present

## 2018-05-14 DIAGNOSIS — I872 Venous insufficiency (chronic) (peripheral): Secondary | ICD-10-CM | POA: Diagnosis not present

## 2018-05-14 DIAGNOSIS — L97822 Non-pressure chronic ulcer of other part of left lower leg with fat layer exposed: Secondary | ICD-10-CM | POA: Diagnosis not present

## 2018-05-14 DIAGNOSIS — I10 Essential (primary) hypertension: Secondary | ICD-10-CM | POA: Diagnosis not present

## 2018-05-14 DIAGNOSIS — G4730 Sleep apnea, unspecified: Secondary | ICD-10-CM | POA: Diagnosis not present

## 2018-05-14 DIAGNOSIS — L97222 Non-pressure chronic ulcer of left calf with fat layer exposed: Secondary | ICD-10-CM | POA: Diagnosis not present

## 2018-05-14 DIAGNOSIS — I890 Lymphedema, not elsewhere classified: Secondary | ICD-10-CM | POA: Diagnosis not present

## 2018-05-14 DIAGNOSIS — L97819 Non-pressure chronic ulcer of other part of right lower leg with unspecified severity: Secondary | ICD-10-CM | POA: Diagnosis not present

## 2018-05-14 DIAGNOSIS — L97212 Non-pressure chronic ulcer of right calf with fat layer exposed: Secondary | ICD-10-CM | POA: Diagnosis not present

## 2018-05-14 DIAGNOSIS — I87323 Chronic venous hypertension (idiopathic) with inflammation of bilateral lower extremity: Secondary | ICD-10-CM | POA: Diagnosis not present

## 2018-05-17 DIAGNOSIS — I509 Heart failure, unspecified: Secondary | ICD-10-CM | POA: Diagnosis not present

## 2018-05-17 DIAGNOSIS — I87333 Chronic venous hypertension (idiopathic) with ulcer and inflammation of bilateral lower extremity: Secondary | ICD-10-CM | POA: Diagnosis not present

## 2018-05-17 DIAGNOSIS — L97212 Non-pressure chronic ulcer of right calf with fat layer exposed: Secondary | ICD-10-CM | POA: Diagnosis not present

## 2018-05-17 DIAGNOSIS — L97821 Non-pressure chronic ulcer of other part of left lower leg limited to breakdown of skin: Secondary | ICD-10-CM | POA: Diagnosis not present

## 2018-05-17 DIAGNOSIS — I890 Lymphedema, not elsewhere classified: Secondary | ICD-10-CM | POA: Diagnosis not present

## 2018-05-21 DIAGNOSIS — L97821 Non-pressure chronic ulcer of other part of left lower leg limited to breakdown of skin: Secondary | ICD-10-CM | POA: Diagnosis not present

## 2018-05-21 DIAGNOSIS — L97212 Non-pressure chronic ulcer of right calf with fat layer exposed: Secondary | ICD-10-CM | POA: Diagnosis not present

## 2018-05-21 DIAGNOSIS — I509 Heart failure, unspecified: Secondary | ICD-10-CM | POA: Diagnosis not present

## 2018-05-21 DIAGNOSIS — I87333 Chronic venous hypertension (idiopathic) with ulcer and inflammation of bilateral lower extremity: Secondary | ICD-10-CM | POA: Diagnosis not present

## 2018-05-21 DIAGNOSIS — I890 Lymphedema, not elsewhere classified: Secondary | ICD-10-CM | POA: Diagnosis not present

## 2018-05-23 ENCOUNTER — Ambulatory Visit (INDEPENDENT_AMBULATORY_CARE_PROVIDER_SITE_OTHER): Payer: Medicare Other

## 2018-05-23 ENCOUNTER — Other Ambulatory Visit: Payer: Self-pay

## 2018-05-23 DIAGNOSIS — I5032 Chronic diastolic (congestive) heart failure: Secondary | ICD-10-CM

## 2018-05-24 DIAGNOSIS — I890 Lymphedema, not elsewhere classified: Secondary | ICD-10-CM | POA: Diagnosis not present

## 2018-05-24 DIAGNOSIS — L97212 Non-pressure chronic ulcer of right calf with fat layer exposed: Secondary | ICD-10-CM | POA: Diagnosis not present

## 2018-05-24 DIAGNOSIS — I509 Heart failure, unspecified: Secondary | ICD-10-CM | POA: Diagnosis not present

## 2018-05-24 DIAGNOSIS — I87333 Chronic venous hypertension (idiopathic) with ulcer and inflammation of bilateral lower extremity: Secondary | ICD-10-CM | POA: Diagnosis not present

## 2018-05-24 DIAGNOSIS — L97821 Non-pressure chronic ulcer of other part of left lower leg limited to breakdown of skin: Secondary | ICD-10-CM | POA: Diagnosis not present

## 2018-05-28 ENCOUNTER — Telehealth: Payer: Self-pay | Admitting: *Deleted

## 2018-05-28 DIAGNOSIS — I890 Lymphedema, not elsewhere classified: Secondary | ICD-10-CM | POA: Diagnosis not present

## 2018-05-28 DIAGNOSIS — I87333 Chronic venous hypertension (idiopathic) with ulcer and inflammation of bilateral lower extremity: Secondary | ICD-10-CM | POA: Diagnosis not present

## 2018-05-28 DIAGNOSIS — L97212 Non-pressure chronic ulcer of right calf with fat layer exposed: Secondary | ICD-10-CM | POA: Diagnosis not present

## 2018-05-28 DIAGNOSIS — I509 Heart failure, unspecified: Secondary | ICD-10-CM | POA: Diagnosis not present

## 2018-05-28 DIAGNOSIS — L97821 Non-pressure chronic ulcer of other part of left lower leg limited to breakdown of skin: Secondary | ICD-10-CM | POA: Diagnosis not present

## 2018-05-28 NOTE — Telephone Encounter (Signed)
-----   Message from Satira Sark, MD sent at 05/24/2018  8:26 AM EDT ----- Results reviewed.  Follow-up study shows stable LVEF at 60 to 65%.  We will continue with current plan. A copy of this test should be forwarded to Manon Hilding, MD.

## 2018-06-04 DIAGNOSIS — I87333 Chronic venous hypertension (idiopathic) with ulcer and inflammation of bilateral lower extremity: Secondary | ICD-10-CM | POA: Diagnosis not present

## 2018-06-04 DIAGNOSIS — L97212 Non-pressure chronic ulcer of right calf with fat layer exposed: Secondary | ICD-10-CM | POA: Diagnosis not present

## 2018-06-04 DIAGNOSIS — I890 Lymphedema, not elsewhere classified: Secondary | ICD-10-CM | POA: Diagnosis not present

## 2018-06-04 DIAGNOSIS — L97821 Non-pressure chronic ulcer of other part of left lower leg limited to breakdown of skin: Secondary | ICD-10-CM | POA: Diagnosis not present

## 2018-06-04 DIAGNOSIS — I509 Heart failure, unspecified: Secondary | ICD-10-CM | POA: Diagnosis not present

## 2018-06-07 ENCOUNTER — Encounter: Payer: Self-pay | Admitting: *Deleted

## 2018-06-07 NOTE — Telephone Encounter (Signed)
Letter mailed with result and copy sent to PCP.

## 2018-06-11 DIAGNOSIS — I890 Lymphedema, not elsewhere classified: Secondary | ICD-10-CM | POA: Diagnosis not present

## 2018-06-11 DIAGNOSIS — L97212 Non-pressure chronic ulcer of right calf with fat layer exposed: Secondary | ICD-10-CM | POA: Diagnosis not present

## 2018-06-11 DIAGNOSIS — I509 Heart failure, unspecified: Secondary | ICD-10-CM | POA: Diagnosis not present

## 2018-06-11 DIAGNOSIS — I87333 Chronic venous hypertension (idiopathic) with ulcer and inflammation of bilateral lower extremity: Secondary | ICD-10-CM | POA: Diagnosis not present

## 2018-06-11 DIAGNOSIS — L97821 Non-pressure chronic ulcer of other part of left lower leg limited to breakdown of skin: Secondary | ICD-10-CM | POA: Diagnosis not present

## 2018-06-14 ENCOUNTER — Encounter (HOSPITAL_BASED_OUTPATIENT_CLINIC_OR_DEPARTMENT_OTHER): Payer: Medicare Other | Attending: Internal Medicine

## 2018-06-14 DIAGNOSIS — I890 Lymphedema, not elsewhere classified: Secondary | ICD-10-CM | POA: Diagnosis not present

## 2018-06-14 DIAGNOSIS — L97819 Non-pressure chronic ulcer of other part of right lower leg with unspecified severity: Secondary | ICD-10-CM | POA: Insufficient documentation

## 2018-06-14 DIAGNOSIS — G4730 Sleep apnea, unspecified: Secondary | ICD-10-CM | POA: Insufficient documentation

## 2018-06-14 DIAGNOSIS — I87323 Chronic venous hypertension (idiopathic) with inflammation of bilateral lower extremity: Secondary | ICD-10-CM | POA: Insufficient documentation

## 2018-06-14 DIAGNOSIS — L97212 Non-pressure chronic ulcer of right calf with fat layer exposed: Secondary | ICD-10-CM | POA: Diagnosis not present

## 2018-06-14 DIAGNOSIS — L97222 Non-pressure chronic ulcer of left calf with fat layer exposed: Secondary | ICD-10-CM | POA: Diagnosis not present

## 2018-06-14 DIAGNOSIS — I10 Essential (primary) hypertension: Secondary | ICD-10-CM | POA: Insufficient documentation

## 2018-06-14 DIAGNOSIS — L97822 Non-pressure chronic ulcer of other part of left lower leg with fat layer exposed: Secondary | ICD-10-CM | POA: Diagnosis not present

## 2018-06-14 DIAGNOSIS — I872 Venous insufficiency (chronic) (peripheral): Secondary | ICD-10-CM | POA: Diagnosis not present

## 2018-06-21 DIAGNOSIS — L97212 Non-pressure chronic ulcer of right calf with fat layer exposed: Secondary | ICD-10-CM | POA: Diagnosis not present

## 2018-06-21 DIAGNOSIS — I509 Heart failure, unspecified: Secondary | ICD-10-CM | POA: Diagnosis not present

## 2018-06-21 DIAGNOSIS — I87333 Chronic venous hypertension (idiopathic) with ulcer and inflammation of bilateral lower extremity: Secondary | ICD-10-CM | POA: Diagnosis not present

## 2018-06-21 DIAGNOSIS — L97821 Non-pressure chronic ulcer of other part of left lower leg limited to breakdown of skin: Secondary | ICD-10-CM | POA: Diagnosis not present

## 2018-06-21 DIAGNOSIS — I890 Lymphedema, not elsewhere classified: Secondary | ICD-10-CM | POA: Diagnosis not present

## 2018-06-28 DIAGNOSIS — I890 Lymphedema, not elsewhere classified: Secondary | ICD-10-CM | POA: Diagnosis not present

## 2018-06-28 DIAGNOSIS — I509 Heart failure, unspecified: Secondary | ICD-10-CM | POA: Diagnosis not present

## 2018-06-28 DIAGNOSIS — L97821 Non-pressure chronic ulcer of other part of left lower leg limited to breakdown of skin: Secondary | ICD-10-CM | POA: Diagnosis not present

## 2018-06-28 DIAGNOSIS — L97212 Non-pressure chronic ulcer of right calf with fat layer exposed: Secondary | ICD-10-CM | POA: Diagnosis not present

## 2018-06-28 DIAGNOSIS — I87333 Chronic venous hypertension (idiopathic) with ulcer and inflammation of bilateral lower extremity: Secondary | ICD-10-CM | POA: Diagnosis not present

## 2018-07-05 DIAGNOSIS — I87333 Chronic venous hypertension (idiopathic) with ulcer and inflammation of bilateral lower extremity: Secondary | ICD-10-CM | POA: Diagnosis not present

## 2018-07-05 DIAGNOSIS — L97821 Non-pressure chronic ulcer of other part of left lower leg limited to breakdown of skin: Secondary | ICD-10-CM | POA: Diagnosis not present

## 2018-07-05 DIAGNOSIS — I890 Lymphedema, not elsewhere classified: Secondary | ICD-10-CM | POA: Diagnosis not present

## 2018-07-05 DIAGNOSIS — L97212 Non-pressure chronic ulcer of right calf with fat layer exposed: Secondary | ICD-10-CM | POA: Diagnosis not present

## 2018-07-05 DIAGNOSIS — I509 Heart failure, unspecified: Secondary | ICD-10-CM | POA: Diagnosis not present

## 2018-07-10 DIAGNOSIS — I87333 Chronic venous hypertension (idiopathic) with ulcer and inflammation of bilateral lower extremity: Secondary | ICD-10-CM | POA: Diagnosis not present

## 2018-07-10 DIAGNOSIS — I890 Lymphedema, not elsewhere classified: Secondary | ICD-10-CM | POA: Diagnosis not present

## 2018-07-10 DIAGNOSIS — G4730 Sleep apnea, unspecified: Secondary | ICD-10-CM | POA: Diagnosis not present

## 2018-07-10 DIAGNOSIS — L97212 Non-pressure chronic ulcer of right calf with fat layer exposed: Secondary | ICD-10-CM | POA: Diagnosis not present

## 2018-07-10 DIAGNOSIS — I509 Heart failure, unspecified: Secondary | ICD-10-CM | POA: Diagnosis not present

## 2018-07-10 DIAGNOSIS — L97821 Non-pressure chronic ulcer of other part of left lower leg limited to breakdown of skin: Secondary | ICD-10-CM | POA: Diagnosis not present

## 2018-07-12 ENCOUNTER — Encounter (HOSPITAL_BASED_OUTPATIENT_CLINIC_OR_DEPARTMENT_OTHER): Payer: Medicare Other | Attending: Internal Medicine

## 2018-07-12 DIAGNOSIS — G4730 Sleep apnea, unspecified: Secondary | ICD-10-CM | POA: Insufficient documentation

## 2018-07-12 DIAGNOSIS — I890 Lymphedema, not elsewhere classified: Secondary | ICD-10-CM | POA: Insufficient documentation

## 2018-07-12 DIAGNOSIS — L97829 Non-pressure chronic ulcer of other part of left lower leg with unspecified severity: Secondary | ICD-10-CM | POA: Diagnosis not present

## 2018-07-12 DIAGNOSIS — L97219 Non-pressure chronic ulcer of right calf with unspecified severity: Secondary | ICD-10-CM | POA: Diagnosis not present

## 2018-07-12 DIAGNOSIS — L97229 Non-pressure chronic ulcer of left calf with unspecified severity: Secondary | ICD-10-CM | POA: Insufficient documentation

## 2018-07-12 DIAGNOSIS — I87333 Chronic venous hypertension (idiopathic) with ulcer and inflammation of bilateral lower extremity: Secondary | ICD-10-CM | POA: Diagnosis not present

## 2018-07-12 DIAGNOSIS — I10 Essential (primary) hypertension: Secondary | ICD-10-CM | POA: Insufficient documentation

## 2018-07-12 DIAGNOSIS — I87323 Chronic venous hypertension (idiopathic) with inflammation of bilateral lower extremity: Secondary | ICD-10-CM | POA: Insufficient documentation

## 2018-07-19 DIAGNOSIS — I509 Heart failure, unspecified: Secondary | ICD-10-CM | POA: Diagnosis not present

## 2018-07-19 DIAGNOSIS — L97821 Non-pressure chronic ulcer of other part of left lower leg limited to breakdown of skin: Secondary | ICD-10-CM | POA: Diagnosis not present

## 2018-07-19 DIAGNOSIS — I890 Lymphedema, not elsewhere classified: Secondary | ICD-10-CM | POA: Diagnosis not present

## 2018-07-19 DIAGNOSIS — L97212 Non-pressure chronic ulcer of right calf with fat layer exposed: Secondary | ICD-10-CM | POA: Diagnosis not present

## 2018-07-19 DIAGNOSIS — I87333 Chronic venous hypertension (idiopathic) with ulcer and inflammation of bilateral lower extremity: Secondary | ICD-10-CM | POA: Diagnosis not present

## 2018-07-26 DIAGNOSIS — I87333 Chronic venous hypertension (idiopathic) with ulcer and inflammation of bilateral lower extremity: Secondary | ICD-10-CM | POA: Diagnosis not present

## 2018-07-26 DIAGNOSIS — L97212 Non-pressure chronic ulcer of right calf with fat layer exposed: Secondary | ICD-10-CM | POA: Diagnosis not present

## 2018-07-26 DIAGNOSIS — L97821 Non-pressure chronic ulcer of other part of left lower leg limited to breakdown of skin: Secondary | ICD-10-CM | POA: Diagnosis not present

## 2018-07-26 DIAGNOSIS — I890 Lymphedema, not elsewhere classified: Secondary | ICD-10-CM | POA: Diagnosis not present

## 2018-07-26 DIAGNOSIS — I509 Heart failure, unspecified: Secondary | ICD-10-CM | POA: Diagnosis not present

## 2018-08-02 DIAGNOSIS — I890 Lymphedema, not elsewhere classified: Secondary | ICD-10-CM | POA: Diagnosis not present

## 2018-08-02 DIAGNOSIS — I509 Heart failure, unspecified: Secondary | ICD-10-CM | POA: Diagnosis not present

## 2018-08-02 DIAGNOSIS — L97821 Non-pressure chronic ulcer of other part of left lower leg limited to breakdown of skin: Secondary | ICD-10-CM | POA: Diagnosis not present

## 2018-08-02 DIAGNOSIS — L97212 Non-pressure chronic ulcer of right calf with fat layer exposed: Secondary | ICD-10-CM | POA: Diagnosis not present

## 2018-08-02 DIAGNOSIS — I87333 Chronic venous hypertension (idiopathic) with ulcer and inflammation of bilateral lower extremity: Secondary | ICD-10-CM | POA: Diagnosis not present

## 2018-08-09 DIAGNOSIS — L97212 Non-pressure chronic ulcer of right calf with fat layer exposed: Secondary | ICD-10-CM | POA: Diagnosis not present

## 2018-08-09 DIAGNOSIS — I890 Lymphedema, not elsewhere classified: Secondary | ICD-10-CM | POA: Diagnosis not present

## 2018-08-09 DIAGNOSIS — I87333 Chronic venous hypertension (idiopathic) with ulcer and inflammation of bilateral lower extremity: Secondary | ICD-10-CM | POA: Diagnosis not present

## 2018-08-09 DIAGNOSIS — L97821 Non-pressure chronic ulcer of other part of left lower leg limited to breakdown of skin: Secondary | ICD-10-CM | POA: Diagnosis not present

## 2018-08-09 DIAGNOSIS — I509 Heart failure, unspecified: Secondary | ICD-10-CM | POA: Diagnosis not present

## 2018-08-12 ENCOUNTER — Encounter (HOSPITAL_BASED_OUTPATIENT_CLINIC_OR_DEPARTMENT_OTHER): Payer: Medicare Other | Attending: Internal Medicine

## 2018-08-12 DIAGNOSIS — E1121 Type 2 diabetes mellitus with diabetic nephropathy: Secondary | ICD-10-CM | POA: Diagnosis not present

## 2018-08-12 DIAGNOSIS — I87332 Chronic venous hypertension (idiopathic) with ulcer and inflammation of left lower extremity: Secondary | ICD-10-CM | POA: Insufficient documentation

## 2018-08-12 DIAGNOSIS — I890 Lymphedema, not elsewhere classified: Secondary | ICD-10-CM | POA: Insufficient documentation

## 2018-08-12 DIAGNOSIS — I87321 Chronic venous hypertension (idiopathic) with inflammation of right lower extremity: Secondary | ICD-10-CM | POA: Diagnosis not present

## 2018-08-12 DIAGNOSIS — E1129 Type 2 diabetes mellitus with other diabetic kidney complication: Secondary | ICD-10-CM | POA: Diagnosis not present

## 2018-08-12 DIAGNOSIS — Z23 Encounter for immunization: Secondary | ICD-10-CM | POA: Diagnosis not present

## 2018-08-12 DIAGNOSIS — L97821 Non-pressure chronic ulcer of other part of left lower leg limited to breakdown of skin: Secondary | ICD-10-CM | POA: Insufficient documentation

## 2018-08-12 DIAGNOSIS — I10 Essential (primary) hypertension: Secondary | ICD-10-CM | POA: Diagnosis not present

## 2018-08-12 DIAGNOSIS — Z8673 Personal history of transient ischemic attack (TIA), and cerebral infarction without residual deficits: Secondary | ICD-10-CM | POA: Diagnosis not present

## 2018-08-12 DIAGNOSIS — E7800 Pure hypercholesterolemia, unspecified: Secondary | ICD-10-CM | POA: Diagnosis not present

## 2018-08-12 DIAGNOSIS — Z6841 Body Mass Index (BMI) 40.0 and over, adult: Secondary | ICD-10-CM | POA: Diagnosis not present

## 2018-08-21 ENCOUNTER — Encounter (HOSPITAL_BASED_OUTPATIENT_CLINIC_OR_DEPARTMENT_OTHER): Payer: Medicare Other | Attending: Physician Assistant

## 2018-08-21 DIAGNOSIS — I890 Lymphedema, not elsewhere classified: Secondary | ICD-10-CM | POA: Insufficient documentation

## 2018-08-21 DIAGNOSIS — L97822 Non-pressure chronic ulcer of other part of left lower leg with fat layer exposed: Secondary | ICD-10-CM | POA: Diagnosis not present

## 2018-08-21 DIAGNOSIS — I87332 Chronic venous hypertension (idiopathic) with ulcer and inflammation of left lower extremity: Secondary | ICD-10-CM | POA: Diagnosis not present

## 2018-08-21 DIAGNOSIS — L97229 Non-pressure chronic ulcer of left calf with unspecified severity: Secondary | ICD-10-CM | POA: Diagnosis not present

## 2018-09-04 DIAGNOSIS — I87333 Chronic venous hypertension (idiopathic) with ulcer and inflammation of bilateral lower extremity: Secondary | ICD-10-CM | POA: Diagnosis not present

## 2018-09-04 DIAGNOSIS — L97212 Non-pressure chronic ulcer of right calf with fat layer exposed: Secondary | ICD-10-CM | POA: Diagnosis not present

## 2018-09-04 DIAGNOSIS — I509 Heart failure, unspecified: Secondary | ICD-10-CM | POA: Diagnosis not present

## 2018-09-04 DIAGNOSIS — L97821 Non-pressure chronic ulcer of other part of left lower leg limited to breakdown of skin: Secondary | ICD-10-CM | POA: Diagnosis not present

## 2018-09-04 DIAGNOSIS — I890 Lymphedema, not elsewhere classified: Secondary | ICD-10-CM | POA: Diagnosis not present

## 2018-09-08 DIAGNOSIS — I509 Heart failure, unspecified: Secondary | ICD-10-CM | POA: Diagnosis not present

## 2018-09-08 DIAGNOSIS — L97821 Non-pressure chronic ulcer of other part of left lower leg limited to breakdown of skin: Secondary | ICD-10-CM | POA: Diagnosis not present

## 2018-09-08 DIAGNOSIS — G4730 Sleep apnea, unspecified: Secondary | ICD-10-CM | POA: Diagnosis not present

## 2018-09-08 DIAGNOSIS — I890 Lymphedema, not elsewhere classified: Secondary | ICD-10-CM | POA: Diagnosis not present

## 2018-09-08 DIAGNOSIS — I87333 Chronic venous hypertension (idiopathic) with ulcer and inflammation of bilateral lower extremity: Secondary | ICD-10-CM | POA: Diagnosis not present

## 2018-09-09 DIAGNOSIS — I890 Lymphedema, not elsewhere classified: Secondary | ICD-10-CM | POA: Diagnosis not present

## 2018-09-09 DIAGNOSIS — I87333 Chronic venous hypertension (idiopathic) with ulcer and inflammation of bilateral lower extremity: Secondary | ICD-10-CM | POA: Diagnosis not present

## 2018-09-09 DIAGNOSIS — G4730 Sleep apnea, unspecified: Secondary | ICD-10-CM | POA: Diagnosis not present

## 2018-09-09 DIAGNOSIS — I509 Heart failure, unspecified: Secondary | ICD-10-CM | POA: Diagnosis not present

## 2018-09-09 DIAGNOSIS — L97821 Non-pressure chronic ulcer of other part of left lower leg limited to breakdown of skin: Secondary | ICD-10-CM | POA: Diagnosis not present

## 2018-09-13 DIAGNOSIS — I509 Heart failure, unspecified: Secondary | ICD-10-CM | POA: Diagnosis not present

## 2018-09-13 DIAGNOSIS — L97821 Non-pressure chronic ulcer of other part of left lower leg limited to breakdown of skin: Secondary | ICD-10-CM | POA: Diagnosis not present

## 2018-09-13 DIAGNOSIS — I890 Lymphedema, not elsewhere classified: Secondary | ICD-10-CM | POA: Diagnosis not present

## 2018-09-13 DIAGNOSIS — I87333 Chronic venous hypertension (idiopathic) with ulcer and inflammation of bilateral lower extremity: Secondary | ICD-10-CM | POA: Diagnosis not present

## 2018-09-13 DIAGNOSIS — G4730 Sleep apnea, unspecified: Secondary | ICD-10-CM | POA: Diagnosis not present

## 2018-09-17 DIAGNOSIS — L97821 Non-pressure chronic ulcer of other part of left lower leg limited to breakdown of skin: Secondary | ICD-10-CM | POA: Diagnosis not present

## 2018-09-17 DIAGNOSIS — I509 Heart failure, unspecified: Secondary | ICD-10-CM | POA: Diagnosis not present

## 2018-09-17 DIAGNOSIS — I87333 Chronic venous hypertension (idiopathic) with ulcer and inflammation of bilateral lower extremity: Secondary | ICD-10-CM | POA: Diagnosis not present

## 2018-09-17 DIAGNOSIS — I890 Lymphedema, not elsewhere classified: Secondary | ICD-10-CM | POA: Diagnosis not present

## 2018-09-17 DIAGNOSIS — G4730 Sleep apnea, unspecified: Secondary | ICD-10-CM | POA: Diagnosis not present

## 2018-09-19 ENCOUNTER — Encounter (HOSPITAL_BASED_OUTPATIENT_CLINIC_OR_DEPARTMENT_OTHER): Payer: Medicare Other | Attending: Internal Medicine

## 2018-09-19 DIAGNOSIS — I87323 Chronic venous hypertension (idiopathic) with inflammation of bilateral lower extremity: Secondary | ICD-10-CM | POA: Insufficient documentation

## 2018-09-19 DIAGNOSIS — I10 Essential (primary) hypertension: Secondary | ICD-10-CM | POA: Insufficient documentation

## 2018-09-19 DIAGNOSIS — L97822 Non-pressure chronic ulcer of other part of left lower leg with fat layer exposed: Secondary | ICD-10-CM | POA: Insufficient documentation

## 2018-09-19 DIAGNOSIS — G4730 Sleep apnea, unspecified: Secondary | ICD-10-CM | POA: Insufficient documentation

## 2018-09-19 DIAGNOSIS — L97812 Non-pressure chronic ulcer of other part of right lower leg with fat layer exposed: Secondary | ICD-10-CM | POA: Insufficient documentation

## 2018-09-19 DIAGNOSIS — I890 Lymphedema, not elsewhere classified: Secondary | ICD-10-CM | POA: Insufficient documentation

## 2018-09-20 DIAGNOSIS — I509 Heart failure, unspecified: Secondary | ICD-10-CM | POA: Diagnosis not present

## 2018-09-20 DIAGNOSIS — L97821 Non-pressure chronic ulcer of other part of left lower leg limited to breakdown of skin: Secondary | ICD-10-CM | POA: Diagnosis not present

## 2018-09-20 DIAGNOSIS — I890 Lymphedema, not elsewhere classified: Secondary | ICD-10-CM | POA: Diagnosis not present

## 2018-09-20 DIAGNOSIS — G4730 Sleep apnea, unspecified: Secondary | ICD-10-CM | POA: Diagnosis not present

## 2018-09-20 DIAGNOSIS — I87333 Chronic venous hypertension (idiopathic) with ulcer and inflammation of bilateral lower extremity: Secondary | ICD-10-CM | POA: Diagnosis not present

## 2018-09-24 DIAGNOSIS — I509 Heart failure, unspecified: Secondary | ICD-10-CM | POA: Diagnosis not present

## 2018-09-24 DIAGNOSIS — I890 Lymphedema, not elsewhere classified: Secondary | ICD-10-CM | POA: Diagnosis not present

## 2018-09-24 DIAGNOSIS — L97821 Non-pressure chronic ulcer of other part of left lower leg limited to breakdown of skin: Secondary | ICD-10-CM | POA: Diagnosis not present

## 2018-09-24 DIAGNOSIS — I87333 Chronic venous hypertension (idiopathic) with ulcer and inflammation of bilateral lower extremity: Secondary | ICD-10-CM | POA: Diagnosis not present

## 2018-09-24 DIAGNOSIS — G4730 Sleep apnea, unspecified: Secondary | ICD-10-CM | POA: Diagnosis not present

## 2018-09-27 DIAGNOSIS — I10 Essential (primary) hypertension: Secondary | ICD-10-CM | POA: Diagnosis not present

## 2018-09-27 DIAGNOSIS — L97822 Non-pressure chronic ulcer of other part of left lower leg with fat layer exposed: Secondary | ICD-10-CM | POA: Diagnosis not present

## 2018-09-27 DIAGNOSIS — L97812 Non-pressure chronic ulcer of other part of right lower leg with fat layer exposed: Secondary | ICD-10-CM | POA: Diagnosis not present

## 2018-09-27 DIAGNOSIS — I87323 Chronic venous hypertension (idiopathic) with inflammation of bilateral lower extremity: Secondary | ICD-10-CM | POA: Diagnosis not present

## 2018-09-27 DIAGNOSIS — I890 Lymphedema, not elsewhere classified: Secondary | ICD-10-CM | POA: Diagnosis not present

## 2018-09-27 DIAGNOSIS — G4730 Sleep apnea, unspecified: Secondary | ICD-10-CM | POA: Diagnosis not present

## 2018-10-01 DIAGNOSIS — I890 Lymphedema, not elsewhere classified: Secondary | ICD-10-CM | POA: Diagnosis not present

## 2018-10-01 DIAGNOSIS — I509 Heart failure, unspecified: Secondary | ICD-10-CM | POA: Diagnosis not present

## 2018-10-01 DIAGNOSIS — L97821 Non-pressure chronic ulcer of other part of left lower leg limited to breakdown of skin: Secondary | ICD-10-CM | POA: Diagnosis not present

## 2018-10-01 DIAGNOSIS — G4730 Sleep apnea, unspecified: Secondary | ICD-10-CM | POA: Diagnosis not present

## 2018-10-01 DIAGNOSIS — I87333 Chronic venous hypertension (idiopathic) with ulcer and inflammation of bilateral lower extremity: Secondary | ICD-10-CM | POA: Diagnosis not present

## 2018-10-04 DIAGNOSIS — L97821 Non-pressure chronic ulcer of other part of left lower leg limited to breakdown of skin: Secondary | ICD-10-CM | POA: Diagnosis not present

## 2018-10-04 DIAGNOSIS — G4730 Sleep apnea, unspecified: Secondary | ICD-10-CM | POA: Diagnosis not present

## 2018-10-04 DIAGNOSIS — I87333 Chronic venous hypertension (idiopathic) with ulcer and inflammation of bilateral lower extremity: Secondary | ICD-10-CM | POA: Diagnosis not present

## 2018-10-04 DIAGNOSIS — I890 Lymphedema, not elsewhere classified: Secondary | ICD-10-CM | POA: Diagnosis not present

## 2018-10-04 DIAGNOSIS — I509 Heart failure, unspecified: Secondary | ICD-10-CM | POA: Diagnosis not present

## 2018-10-08 DIAGNOSIS — I509 Heart failure, unspecified: Secondary | ICD-10-CM | POA: Diagnosis not present

## 2018-10-08 DIAGNOSIS — I87333 Chronic venous hypertension (idiopathic) with ulcer and inflammation of bilateral lower extremity: Secondary | ICD-10-CM | POA: Diagnosis not present

## 2018-10-08 DIAGNOSIS — G4730 Sleep apnea, unspecified: Secondary | ICD-10-CM | POA: Diagnosis not present

## 2018-10-08 DIAGNOSIS — I890 Lymphedema, not elsewhere classified: Secondary | ICD-10-CM | POA: Diagnosis not present

## 2018-10-08 DIAGNOSIS — L97821 Non-pressure chronic ulcer of other part of left lower leg limited to breakdown of skin: Secondary | ICD-10-CM | POA: Diagnosis not present

## 2018-10-11 DIAGNOSIS — G4730 Sleep apnea, unspecified: Secondary | ICD-10-CM | POA: Diagnosis not present

## 2018-10-11 DIAGNOSIS — L97821 Non-pressure chronic ulcer of other part of left lower leg limited to breakdown of skin: Secondary | ICD-10-CM | POA: Diagnosis not present

## 2018-10-11 DIAGNOSIS — I509 Heart failure, unspecified: Secondary | ICD-10-CM | POA: Diagnosis not present

## 2018-10-11 DIAGNOSIS — I87333 Chronic venous hypertension (idiopathic) with ulcer and inflammation of bilateral lower extremity: Secondary | ICD-10-CM | POA: Diagnosis not present

## 2018-10-11 DIAGNOSIS — I890 Lymphedema, not elsewhere classified: Secondary | ICD-10-CM | POA: Diagnosis not present

## 2018-10-15 DIAGNOSIS — G4730 Sleep apnea, unspecified: Secondary | ICD-10-CM | POA: Diagnosis not present

## 2018-10-15 DIAGNOSIS — I509 Heart failure, unspecified: Secondary | ICD-10-CM | POA: Diagnosis not present

## 2018-10-15 DIAGNOSIS — L97821 Non-pressure chronic ulcer of other part of left lower leg limited to breakdown of skin: Secondary | ICD-10-CM | POA: Diagnosis not present

## 2018-10-15 DIAGNOSIS — I87333 Chronic venous hypertension (idiopathic) with ulcer and inflammation of bilateral lower extremity: Secondary | ICD-10-CM | POA: Diagnosis not present

## 2018-10-15 DIAGNOSIS — I890 Lymphedema, not elsewhere classified: Secondary | ICD-10-CM | POA: Diagnosis not present

## 2018-10-16 DIAGNOSIS — R269 Unspecified abnormalities of gait and mobility: Secondary | ICD-10-CM | POA: Diagnosis not present

## 2018-10-16 DIAGNOSIS — Z6841 Body Mass Index (BMI) 40.0 and over, adult: Secondary | ICD-10-CM | POA: Diagnosis not present

## 2018-10-18 DIAGNOSIS — I509 Heart failure, unspecified: Secondary | ICD-10-CM | POA: Diagnosis not present

## 2018-10-18 DIAGNOSIS — I87333 Chronic venous hypertension (idiopathic) with ulcer and inflammation of bilateral lower extremity: Secondary | ICD-10-CM | POA: Diagnosis not present

## 2018-10-18 DIAGNOSIS — L97821 Non-pressure chronic ulcer of other part of left lower leg limited to breakdown of skin: Secondary | ICD-10-CM | POA: Diagnosis not present

## 2018-10-18 DIAGNOSIS — I890 Lymphedema, not elsewhere classified: Secondary | ICD-10-CM | POA: Diagnosis not present

## 2018-10-18 DIAGNOSIS — G4730 Sleep apnea, unspecified: Secondary | ICD-10-CM | POA: Diagnosis not present

## 2018-10-22 DIAGNOSIS — G4730 Sleep apnea, unspecified: Secondary | ICD-10-CM | POA: Diagnosis not present

## 2018-10-22 DIAGNOSIS — I87333 Chronic venous hypertension (idiopathic) with ulcer and inflammation of bilateral lower extremity: Secondary | ICD-10-CM | POA: Diagnosis not present

## 2018-10-22 DIAGNOSIS — I890 Lymphedema, not elsewhere classified: Secondary | ICD-10-CM | POA: Diagnosis not present

## 2018-10-22 DIAGNOSIS — L97821 Non-pressure chronic ulcer of other part of left lower leg limited to breakdown of skin: Secondary | ICD-10-CM | POA: Diagnosis not present

## 2018-10-22 DIAGNOSIS — I509 Heart failure, unspecified: Secondary | ICD-10-CM | POA: Diagnosis not present

## 2018-10-24 ENCOUNTER — Encounter (HOSPITAL_BASED_OUTPATIENT_CLINIC_OR_DEPARTMENT_OTHER): Payer: Medicare Other | Attending: Internal Medicine

## 2018-10-24 DIAGNOSIS — I10 Essential (primary) hypertension: Secondary | ICD-10-CM | POA: Insufficient documentation

## 2018-10-24 DIAGNOSIS — G4730 Sleep apnea, unspecified: Secondary | ICD-10-CM | POA: Diagnosis not present

## 2018-10-24 DIAGNOSIS — L97812 Non-pressure chronic ulcer of other part of right lower leg with fat layer exposed: Secondary | ICD-10-CM | POA: Diagnosis not present

## 2018-10-24 DIAGNOSIS — I890 Lymphedema, not elsewhere classified: Secondary | ICD-10-CM | POA: Diagnosis not present

## 2018-10-24 DIAGNOSIS — I87323 Chronic venous hypertension (idiopathic) with inflammation of bilateral lower extremity: Secondary | ICD-10-CM | POA: Insufficient documentation

## 2018-10-24 DIAGNOSIS — L97822 Non-pressure chronic ulcer of other part of left lower leg with fat layer exposed: Secondary | ICD-10-CM | POA: Diagnosis not present

## 2018-10-24 DIAGNOSIS — L97222 Non-pressure chronic ulcer of left calf with fat layer exposed: Secondary | ICD-10-CM | POA: Diagnosis not present

## 2018-10-28 DIAGNOSIS — L97821 Non-pressure chronic ulcer of other part of left lower leg limited to breakdown of skin: Secondary | ICD-10-CM | POA: Diagnosis not present

## 2018-10-28 DIAGNOSIS — I87333 Chronic venous hypertension (idiopathic) with ulcer and inflammation of bilateral lower extremity: Secondary | ICD-10-CM | POA: Diagnosis not present

## 2018-10-28 DIAGNOSIS — I509 Heart failure, unspecified: Secondary | ICD-10-CM | POA: Diagnosis not present

## 2018-10-28 DIAGNOSIS — I890 Lymphedema, not elsewhere classified: Secondary | ICD-10-CM | POA: Diagnosis not present

## 2018-10-28 DIAGNOSIS — G4730 Sleep apnea, unspecified: Secondary | ICD-10-CM | POA: Diagnosis not present

## 2018-10-29 DIAGNOSIS — I87333 Chronic venous hypertension (idiopathic) with ulcer and inflammation of bilateral lower extremity: Secondary | ICD-10-CM | POA: Diagnosis not present

## 2018-10-29 DIAGNOSIS — L97821 Non-pressure chronic ulcer of other part of left lower leg limited to breakdown of skin: Secondary | ICD-10-CM | POA: Diagnosis not present

## 2018-10-29 DIAGNOSIS — I509 Heart failure, unspecified: Secondary | ICD-10-CM | POA: Diagnosis not present

## 2018-10-29 DIAGNOSIS — G4730 Sleep apnea, unspecified: Secondary | ICD-10-CM | POA: Diagnosis not present

## 2018-10-29 DIAGNOSIS — I890 Lymphedema, not elsewhere classified: Secondary | ICD-10-CM | POA: Diagnosis not present

## 2018-10-30 DIAGNOSIS — L97821 Non-pressure chronic ulcer of other part of left lower leg limited to breakdown of skin: Secondary | ICD-10-CM | POA: Diagnosis not present

## 2018-10-30 DIAGNOSIS — G4730 Sleep apnea, unspecified: Secondary | ICD-10-CM | POA: Diagnosis not present

## 2018-10-30 DIAGNOSIS — I890 Lymphedema, not elsewhere classified: Secondary | ICD-10-CM | POA: Diagnosis not present

## 2018-10-30 DIAGNOSIS — I509 Heart failure, unspecified: Secondary | ICD-10-CM | POA: Diagnosis not present

## 2018-10-30 DIAGNOSIS — I87333 Chronic venous hypertension (idiopathic) with ulcer and inflammation of bilateral lower extremity: Secondary | ICD-10-CM | POA: Diagnosis not present

## 2018-10-31 DIAGNOSIS — Z6841 Body Mass Index (BMI) 40.0 and over, adult: Secondary | ICD-10-CM | POA: Diagnosis not present

## 2018-10-31 DIAGNOSIS — L03116 Cellulitis of left lower limb: Secondary | ICD-10-CM | POA: Diagnosis not present

## 2018-11-06 DIAGNOSIS — I890 Lymphedema, not elsewhere classified: Secondary | ICD-10-CM | POA: Diagnosis not present

## 2018-11-06 DIAGNOSIS — I87333 Chronic venous hypertension (idiopathic) with ulcer and inflammation of bilateral lower extremity: Secondary | ICD-10-CM | POA: Diagnosis not present

## 2018-11-06 DIAGNOSIS — L97821 Non-pressure chronic ulcer of other part of left lower leg limited to breakdown of skin: Secondary | ICD-10-CM | POA: Diagnosis not present

## 2018-11-06 DIAGNOSIS — I509 Heart failure, unspecified: Secondary | ICD-10-CM | POA: Diagnosis not present

## 2018-11-06 DIAGNOSIS — G4730 Sleep apnea, unspecified: Secondary | ICD-10-CM | POA: Diagnosis not present

## 2018-11-07 DIAGNOSIS — G4730 Sleep apnea, unspecified: Secondary | ICD-10-CM | POA: Diagnosis not present

## 2018-11-07 DIAGNOSIS — I87333 Chronic venous hypertension (idiopathic) with ulcer and inflammation of bilateral lower extremity: Secondary | ICD-10-CM | POA: Diagnosis not present

## 2018-11-07 DIAGNOSIS — Z7982 Long term (current) use of aspirin: Secondary | ICD-10-CM | POA: Diagnosis not present

## 2018-11-07 DIAGNOSIS — L97821 Non-pressure chronic ulcer of other part of left lower leg limited to breakdown of skin: Secondary | ICD-10-CM | POA: Diagnosis not present

## 2018-11-07 DIAGNOSIS — I509 Heart failure, unspecified: Secondary | ICD-10-CM | POA: Diagnosis not present

## 2018-11-07 DIAGNOSIS — I890 Lymphedema, not elsewhere classified: Secondary | ICD-10-CM | POA: Diagnosis not present

## 2018-11-08 DIAGNOSIS — I87333 Chronic venous hypertension (idiopathic) with ulcer and inflammation of bilateral lower extremity: Secondary | ICD-10-CM | POA: Diagnosis not present

## 2018-11-08 DIAGNOSIS — I890 Lymphedema, not elsewhere classified: Secondary | ICD-10-CM | POA: Diagnosis not present

## 2018-11-08 DIAGNOSIS — I509 Heart failure, unspecified: Secondary | ICD-10-CM | POA: Diagnosis not present

## 2018-11-08 DIAGNOSIS — L97821 Non-pressure chronic ulcer of other part of left lower leg limited to breakdown of skin: Secondary | ICD-10-CM | POA: Diagnosis not present

## 2018-11-08 DIAGNOSIS — G4730 Sleep apnea, unspecified: Secondary | ICD-10-CM | POA: Diagnosis not present

## 2018-11-11 DIAGNOSIS — I87333 Chronic venous hypertension (idiopathic) with ulcer and inflammation of bilateral lower extremity: Secondary | ICD-10-CM | POA: Diagnosis not present

## 2018-11-11 DIAGNOSIS — G4730 Sleep apnea, unspecified: Secondary | ICD-10-CM | POA: Diagnosis not present

## 2018-11-11 DIAGNOSIS — I890 Lymphedema, not elsewhere classified: Secondary | ICD-10-CM | POA: Diagnosis not present

## 2018-11-11 DIAGNOSIS — I509 Heart failure, unspecified: Secondary | ICD-10-CM | POA: Diagnosis not present

## 2018-11-11 DIAGNOSIS — L97821 Non-pressure chronic ulcer of other part of left lower leg limited to breakdown of skin: Secondary | ICD-10-CM | POA: Diagnosis not present

## 2018-11-12 DIAGNOSIS — L97821 Non-pressure chronic ulcer of other part of left lower leg limited to breakdown of skin: Secondary | ICD-10-CM | POA: Diagnosis not present

## 2018-11-12 DIAGNOSIS — I87333 Chronic venous hypertension (idiopathic) with ulcer and inflammation of bilateral lower extremity: Secondary | ICD-10-CM | POA: Diagnosis not present

## 2018-11-12 DIAGNOSIS — I509 Heart failure, unspecified: Secondary | ICD-10-CM | POA: Diagnosis not present

## 2018-11-12 DIAGNOSIS — G4730 Sleep apnea, unspecified: Secondary | ICD-10-CM | POA: Diagnosis not present

## 2018-11-12 DIAGNOSIS — I890 Lymphedema, not elsewhere classified: Secondary | ICD-10-CM | POA: Diagnosis not present

## 2018-11-13 DIAGNOSIS — I890 Lymphedema, not elsewhere classified: Secondary | ICD-10-CM | POA: Diagnosis not present

## 2018-11-13 DIAGNOSIS — I509 Heart failure, unspecified: Secondary | ICD-10-CM | POA: Diagnosis not present

## 2018-11-13 DIAGNOSIS — I87333 Chronic venous hypertension (idiopathic) with ulcer and inflammation of bilateral lower extremity: Secondary | ICD-10-CM | POA: Diagnosis not present

## 2018-11-13 DIAGNOSIS — L97821 Non-pressure chronic ulcer of other part of left lower leg limited to breakdown of skin: Secondary | ICD-10-CM | POA: Diagnosis not present

## 2018-11-13 DIAGNOSIS — G4730 Sleep apnea, unspecified: Secondary | ICD-10-CM | POA: Diagnosis not present

## 2018-11-15 DIAGNOSIS — L97821 Non-pressure chronic ulcer of other part of left lower leg limited to breakdown of skin: Secondary | ICD-10-CM | POA: Diagnosis not present

## 2018-11-15 DIAGNOSIS — I87333 Chronic venous hypertension (idiopathic) with ulcer and inflammation of bilateral lower extremity: Secondary | ICD-10-CM | POA: Diagnosis not present

## 2018-11-15 DIAGNOSIS — G4730 Sleep apnea, unspecified: Secondary | ICD-10-CM | POA: Diagnosis not present

## 2018-11-15 DIAGNOSIS — I890 Lymphedema, not elsewhere classified: Secondary | ICD-10-CM | POA: Diagnosis not present

## 2018-11-15 DIAGNOSIS — I509 Heart failure, unspecified: Secondary | ICD-10-CM | POA: Diagnosis not present

## 2018-11-18 DIAGNOSIS — L97821 Non-pressure chronic ulcer of other part of left lower leg limited to breakdown of skin: Secondary | ICD-10-CM | POA: Diagnosis not present

## 2018-11-18 DIAGNOSIS — I87333 Chronic venous hypertension (idiopathic) with ulcer and inflammation of bilateral lower extremity: Secondary | ICD-10-CM | POA: Diagnosis not present

## 2018-11-18 DIAGNOSIS — I890 Lymphedema, not elsewhere classified: Secondary | ICD-10-CM | POA: Diagnosis not present

## 2018-11-18 DIAGNOSIS — I509 Heart failure, unspecified: Secondary | ICD-10-CM | POA: Diagnosis not present

## 2018-11-18 DIAGNOSIS — G4730 Sleep apnea, unspecified: Secondary | ICD-10-CM | POA: Diagnosis not present

## 2018-11-19 DIAGNOSIS — G4730 Sleep apnea, unspecified: Secondary | ICD-10-CM | POA: Diagnosis not present

## 2018-11-19 DIAGNOSIS — I890 Lymphedema, not elsewhere classified: Secondary | ICD-10-CM | POA: Diagnosis not present

## 2018-11-19 DIAGNOSIS — L97821 Non-pressure chronic ulcer of other part of left lower leg limited to breakdown of skin: Secondary | ICD-10-CM | POA: Diagnosis not present

## 2018-11-19 DIAGNOSIS — I87333 Chronic venous hypertension (idiopathic) with ulcer and inflammation of bilateral lower extremity: Secondary | ICD-10-CM | POA: Diagnosis not present

## 2018-11-19 DIAGNOSIS — I509 Heart failure, unspecified: Secondary | ICD-10-CM | POA: Diagnosis not present

## 2018-11-22 ENCOUNTER — Encounter (HOSPITAL_BASED_OUTPATIENT_CLINIC_OR_DEPARTMENT_OTHER): Payer: Medicare Other | Attending: Internal Medicine

## 2018-11-22 DIAGNOSIS — I890 Lymphedema, not elsewhere classified: Secondary | ICD-10-CM | POA: Insufficient documentation

## 2018-11-22 DIAGNOSIS — I10 Essential (primary) hypertension: Secondary | ICD-10-CM | POA: Insufficient documentation

## 2018-11-22 DIAGNOSIS — G4730 Sleep apnea, unspecified: Secondary | ICD-10-CM | POA: Insufficient documentation

## 2018-11-22 DIAGNOSIS — I87323 Chronic venous hypertension (idiopathic) with inflammation of bilateral lower extremity: Secondary | ICD-10-CM | POA: Insufficient documentation

## 2018-11-22 DIAGNOSIS — L97822 Non-pressure chronic ulcer of other part of left lower leg with fat layer exposed: Secondary | ICD-10-CM | POA: Insufficient documentation

## 2018-11-22 DIAGNOSIS — L97812 Non-pressure chronic ulcer of other part of right lower leg with fat layer exposed: Secondary | ICD-10-CM | POA: Insufficient documentation

## 2018-11-26 DIAGNOSIS — I87333 Chronic venous hypertension (idiopathic) with ulcer and inflammation of bilateral lower extremity: Secondary | ICD-10-CM | POA: Diagnosis not present

## 2018-11-26 DIAGNOSIS — L97821 Non-pressure chronic ulcer of other part of left lower leg limited to breakdown of skin: Secondary | ICD-10-CM | POA: Diagnosis not present

## 2018-11-26 DIAGNOSIS — I509 Heart failure, unspecified: Secondary | ICD-10-CM | POA: Diagnosis not present

## 2018-11-26 DIAGNOSIS — G4730 Sleep apnea, unspecified: Secondary | ICD-10-CM | POA: Diagnosis not present

## 2018-11-26 DIAGNOSIS — I890 Lymphedema, not elsewhere classified: Secondary | ICD-10-CM | POA: Diagnosis not present

## 2018-11-27 DIAGNOSIS — I890 Lymphedema, not elsewhere classified: Secondary | ICD-10-CM | POA: Diagnosis not present

## 2018-11-27 DIAGNOSIS — G4730 Sleep apnea, unspecified: Secondary | ICD-10-CM | POA: Diagnosis not present

## 2018-11-27 DIAGNOSIS — I509 Heart failure, unspecified: Secondary | ICD-10-CM | POA: Diagnosis not present

## 2018-11-27 DIAGNOSIS — L97821 Non-pressure chronic ulcer of other part of left lower leg limited to breakdown of skin: Secondary | ICD-10-CM | POA: Diagnosis not present

## 2018-11-27 DIAGNOSIS — I87333 Chronic venous hypertension (idiopathic) with ulcer and inflammation of bilateral lower extremity: Secondary | ICD-10-CM | POA: Diagnosis not present

## 2018-11-29 ENCOUNTER — Other Ambulatory Visit: Payer: Self-pay

## 2018-11-29 DIAGNOSIS — I890 Lymphedema, not elsewhere classified: Secondary | ICD-10-CM | POA: Diagnosis not present

## 2018-11-29 DIAGNOSIS — I87323 Chronic venous hypertension (idiopathic) with inflammation of bilateral lower extremity: Secondary | ICD-10-CM | POA: Diagnosis not present

## 2018-11-29 DIAGNOSIS — S81802A Unspecified open wound, left lower leg, initial encounter: Secondary | ICD-10-CM | POA: Diagnosis not present

## 2018-11-29 DIAGNOSIS — L97812 Non-pressure chronic ulcer of other part of right lower leg with fat layer exposed: Secondary | ICD-10-CM | POA: Diagnosis not present

## 2018-11-29 DIAGNOSIS — G4730 Sleep apnea, unspecified: Secondary | ICD-10-CM | POA: Diagnosis not present

## 2018-11-29 DIAGNOSIS — I872 Venous insufficiency (chronic) (peripheral): Secondary | ICD-10-CM | POA: Diagnosis not present

## 2018-11-29 DIAGNOSIS — S81801A Unspecified open wound, right lower leg, initial encounter: Secondary | ICD-10-CM | POA: Diagnosis not present

## 2018-11-29 DIAGNOSIS — L97822 Non-pressure chronic ulcer of other part of left lower leg with fat layer exposed: Secondary | ICD-10-CM | POA: Diagnosis not present

## 2018-11-29 DIAGNOSIS — I10 Essential (primary) hypertension: Secondary | ICD-10-CM | POA: Diagnosis not present

## 2018-12-03 DIAGNOSIS — I890 Lymphedema, not elsewhere classified: Secondary | ICD-10-CM | POA: Diagnosis not present

## 2018-12-03 DIAGNOSIS — I87333 Chronic venous hypertension (idiopathic) with ulcer and inflammation of bilateral lower extremity: Secondary | ICD-10-CM | POA: Diagnosis not present

## 2018-12-03 DIAGNOSIS — G4730 Sleep apnea, unspecified: Secondary | ICD-10-CM | POA: Diagnosis not present

## 2018-12-03 DIAGNOSIS — L97821 Non-pressure chronic ulcer of other part of left lower leg limited to breakdown of skin: Secondary | ICD-10-CM | POA: Diagnosis not present

## 2018-12-03 DIAGNOSIS — I509 Heart failure, unspecified: Secondary | ICD-10-CM | POA: Diagnosis not present

## 2018-12-06 DIAGNOSIS — I509 Heart failure, unspecified: Secondary | ICD-10-CM | POA: Diagnosis not present

## 2018-12-06 DIAGNOSIS — G4730 Sleep apnea, unspecified: Secondary | ICD-10-CM | POA: Diagnosis not present

## 2018-12-06 DIAGNOSIS — I890 Lymphedema, not elsewhere classified: Secondary | ICD-10-CM | POA: Diagnosis not present

## 2018-12-06 DIAGNOSIS — L97821 Non-pressure chronic ulcer of other part of left lower leg limited to breakdown of skin: Secondary | ICD-10-CM | POA: Diagnosis not present

## 2018-12-06 DIAGNOSIS — I87333 Chronic venous hypertension (idiopathic) with ulcer and inflammation of bilateral lower extremity: Secondary | ICD-10-CM | POA: Diagnosis not present

## 2018-12-07 DIAGNOSIS — Z7982 Long term (current) use of aspirin: Secondary | ICD-10-CM | POA: Diagnosis not present

## 2018-12-07 DIAGNOSIS — I890 Lymphedema, not elsewhere classified: Secondary | ICD-10-CM | POA: Diagnosis not present

## 2018-12-07 DIAGNOSIS — G4730 Sleep apnea, unspecified: Secondary | ICD-10-CM | POA: Diagnosis not present

## 2018-12-07 DIAGNOSIS — I509 Heart failure, unspecified: Secondary | ICD-10-CM | POA: Diagnosis not present

## 2018-12-07 DIAGNOSIS — I87333 Chronic venous hypertension (idiopathic) with ulcer and inflammation of bilateral lower extremity: Secondary | ICD-10-CM | POA: Diagnosis not present

## 2018-12-07 DIAGNOSIS — L97821 Non-pressure chronic ulcer of other part of left lower leg limited to breakdown of skin: Secondary | ICD-10-CM | POA: Diagnosis not present

## 2018-12-09 DIAGNOSIS — E1121 Type 2 diabetes mellitus with diabetic nephropathy: Secondary | ICD-10-CM | POA: Diagnosis not present

## 2018-12-09 DIAGNOSIS — I10 Essential (primary) hypertension: Secondary | ICD-10-CM | POA: Diagnosis not present

## 2018-12-09 DIAGNOSIS — E1129 Type 2 diabetes mellitus with other diabetic kidney complication: Secondary | ICD-10-CM | POA: Diagnosis not present

## 2018-12-09 DIAGNOSIS — E7800 Pure hypercholesterolemia, unspecified: Secondary | ICD-10-CM | POA: Diagnosis not present

## 2018-12-09 DIAGNOSIS — Z8673 Personal history of transient ischemic attack (TIA), and cerebral infarction without residual deficits: Secondary | ICD-10-CM | POA: Diagnosis not present

## 2018-12-10 DIAGNOSIS — I890 Lymphedema, not elsewhere classified: Secondary | ICD-10-CM | POA: Diagnosis not present

## 2018-12-10 DIAGNOSIS — G4730 Sleep apnea, unspecified: Secondary | ICD-10-CM | POA: Diagnosis not present

## 2018-12-10 DIAGNOSIS — I509 Heart failure, unspecified: Secondary | ICD-10-CM | POA: Diagnosis not present

## 2018-12-10 DIAGNOSIS — I87333 Chronic venous hypertension (idiopathic) with ulcer and inflammation of bilateral lower extremity: Secondary | ICD-10-CM | POA: Diagnosis not present

## 2018-12-10 DIAGNOSIS — L97821 Non-pressure chronic ulcer of other part of left lower leg limited to breakdown of skin: Secondary | ICD-10-CM | POA: Diagnosis not present

## 2018-12-11 DIAGNOSIS — I10 Essential (primary) hypertension: Secondary | ICD-10-CM | POA: Diagnosis not present

## 2018-12-11 DIAGNOSIS — Z8673 Personal history of transient ischemic attack (TIA), and cerebral infarction without residual deficits: Secondary | ICD-10-CM | POA: Diagnosis not present

## 2018-12-11 DIAGNOSIS — R296 Repeated falls: Secondary | ICD-10-CM | POA: Diagnosis not present

## 2018-12-11 DIAGNOSIS — Z0001 Encounter for general adult medical examination with abnormal findings: Secondary | ICD-10-CM | POA: Diagnosis not present

## 2018-12-11 DIAGNOSIS — Z6841 Body Mass Index (BMI) 40.0 and over, adult: Secondary | ICD-10-CM | POA: Diagnosis not present

## 2018-12-11 DIAGNOSIS — E1129 Type 2 diabetes mellitus with other diabetic kidney complication: Secondary | ICD-10-CM | POA: Diagnosis not present

## 2018-12-11 DIAGNOSIS — E1121 Type 2 diabetes mellitus with diabetic nephropathy: Secondary | ICD-10-CM | POA: Diagnosis not present

## 2018-12-13 DIAGNOSIS — G4730 Sleep apnea, unspecified: Secondary | ICD-10-CM | POA: Diagnosis not present

## 2018-12-13 DIAGNOSIS — I87333 Chronic venous hypertension (idiopathic) with ulcer and inflammation of bilateral lower extremity: Secondary | ICD-10-CM | POA: Diagnosis not present

## 2018-12-13 DIAGNOSIS — I509 Heart failure, unspecified: Secondary | ICD-10-CM | POA: Diagnosis not present

## 2018-12-13 DIAGNOSIS — I890 Lymphedema, not elsewhere classified: Secondary | ICD-10-CM | POA: Diagnosis not present

## 2018-12-13 DIAGNOSIS — L97821 Non-pressure chronic ulcer of other part of left lower leg limited to breakdown of skin: Secondary | ICD-10-CM | POA: Diagnosis not present

## 2018-12-16 DIAGNOSIS — E7800 Pure hypercholesterolemia, unspecified: Secondary | ICD-10-CM | POA: Diagnosis not present

## 2018-12-16 DIAGNOSIS — I10 Essential (primary) hypertension: Secondary | ICD-10-CM | POA: Diagnosis not present

## 2018-12-17 DIAGNOSIS — G4730 Sleep apnea, unspecified: Secondary | ICD-10-CM | POA: Diagnosis not present

## 2018-12-17 DIAGNOSIS — I87333 Chronic venous hypertension (idiopathic) with ulcer and inflammation of bilateral lower extremity: Secondary | ICD-10-CM | POA: Diagnosis not present

## 2018-12-17 DIAGNOSIS — I509 Heart failure, unspecified: Secondary | ICD-10-CM | POA: Diagnosis not present

## 2018-12-17 DIAGNOSIS — I890 Lymphedema, not elsewhere classified: Secondary | ICD-10-CM | POA: Diagnosis not present

## 2018-12-17 DIAGNOSIS — L97821 Non-pressure chronic ulcer of other part of left lower leg limited to breakdown of skin: Secondary | ICD-10-CM | POA: Diagnosis not present

## 2018-12-20 DIAGNOSIS — L97821 Non-pressure chronic ulcer of other part of left lower leg limited to breakdown of skin: Secondary | ICD-10-CM | POA: Diagnosis not present

## 2018-12-20 DIAGNOSIS — G4730 Sleep apnea, unspecified: Secondary | ICD-10-CM | POA: Diagnosis not present

## 2018-12-20 DIAGNOSIS — I509 Heart failure, unspecified: Secondary | ICD-10-CM | POA: Diagnosis not present

## 2018-12-20 DIAGNOSIS — I890 Lymphedema, not elsewhere classified: Secondary | ICD-10-CM | POA: Diagnosis not present

## 2018-12-20 DIAGNOSIS — I87333 Chronic venous hypertension (idiopathic) with ulcer and inflammation of bilateral lower extremity: Secondary | ICD-10-CM | POA: Diagnosis not present

## 2018-12-24 DIAGNOSIS — L97821 Non-pressure chronic ulcer of other part of left lower leg limited to breakdown of skin: Secondary | ICD-10-CM | POA: Diagnosis not present

## 2018-12-24 DIAGNOSIS — G4730 Sleep apnea, unspecified: Secondary | ICD-10-CM | POA: Diagnosis not present

## 2018-12-24 DIAGNOSIS — I87333 Chronic venous hypertension (idiopathic) with ulcer and inflammation of bilateral lower extremity: Secondary | ICD-10-CM | POA: Diagnosis not present

## 2018-12-24 DIAGNOSIS — I890 Lymphedema, not elsewhere classified: Secondary | ICD-10-CM | POA: Diagnosis not present

## 2018-12-24 DIAGNOSIS — I509 Heart failure, unspecified: Secondary | ICD-10-CM | POA: Diagnosis not present

## 2018-12-27 DIAGNOSIS — G4730 Sleep apnea, unspecified: Secondary | ICD-10-CM | POA: Diagnosis not present

## 2018-12-27 DIAGNOSIS — L97821 Non-pressure chronic ulcer of other part of left lower leg limited to breakdown of skin: Secondary | ICD-10-CM | POA: Diagnosis not present

## 2018-12-27 DIAGNOSIS — I509 Heart failure, unspecified: Secondary | ICD-10-CM | POA: Diagnosis not present

## 2018-12-27 DIAGNOSIS — I87333 Chronic venous hypertension (idiopathic) with ulcer and inflammation of bilateral lower extremity: Secondary | ICD-10-CM | POA: Diagnosis not present

## 2018-12-27 DIAGNOSIS — I890 Lymphedema, not elsewhere classified: Secondary | ICD-10-CM | POA: Diagnosis not present

## 2018-12-31 ENCOUNTER — Encounter (HOSPITAL_BASED_OUTPATIENT_CLINIC_OR_DEPARTMENT_OTHER): Payer: Medicare Other | Attending: Internal Medicine

## 2018-12-31 DIAGNOSIS — L97812 Non-pressure chronic ulcer of other part of right lower leg with fat layer exposed: Secondary | ICD-10-CM | POA: Diagnosis not present

## 2018-12-31 DIAGNOSIS — L97822 Non-pressure chronic ulcer of other part of left lower leg with fat layer exposed: Secondary | ICD-10-CM | POA: Diagnosis not present

## 2018-12-31 DIAGNOSIS — L97819 Non-pressure chronic ulcer of other part of right lower leg with unspecified severity: Secondary | ICD-10-CM | POA: Diagnosis not present

## 2018-12-31 DIAGNOSIS — M319 Necrotizing vasculopathy, unspecified: Secondary | ICD-10-CM | POA: Diagnosis not present

## 2018-12-31 DIAGNOSIS — I87333 Chronic venous hypertension (idiopathic) with ulcer and inflammation of bilateral lower extremity: Secondary | ICD-10-CM | POA: Insufficient documentation

## 2018-12-31 DIAGNOSIS — I890 Lymphedema, not elsewhere classified: Secondary | ICD-10-CM | POA: Diagnosis not present

## 2018-12-31 DIAGNOSIS — I87312 Chronic venous hypertension (idiopathic) with ulcer of left lower extremity: Secondary | ICD-10-CM | POA: Diagnosis not present

## 2019-01-04 DIAGNOSIS — I890 Lymphedema, not elsewhere classified: Secondary | ICD-10-CM | POA: Diagnosis not present

## 2019-01-04 DIAGNOSIS — G4730 Sleep apnea, unspecified: Secondary | ICD-10-CM | POA: Diagnosis not present

## 2019-01-04 DIAGNOSIS — I87333 Chronic venous hypertension (idiopathic) with ulcer and inflammation of bilateral lower extremity: Secondary | ICD-10-CM | POA: Diagnosis not present

## 2019-01-04 DIAGNOSIS — I509 Heart failure, unspecified: Secondary | ICD-10-CM | POA: Diagnosis not present

## 2019-01-04 DIAGNOSIS — L97821 Non-pressure chronic ulcer of other part of left lower leg limited to breakdown of skin: Secondary | ICD-10-CM | POA: Diagnosis not present

## 2019-01-06 DIAGNOSIS — I509 Heart failure, unspecified: Secondary | ICD-10-CM | POA: Diagnosis not present

## 2019-01-06 DIAGNOSIS — I890 Lymphedema, not elsewhere classified: Secondary | ICD-10-CM | POA: Diagnosis not present

## 2019-01-06 DIAGNOSIS — G4730 Sleep apnea, unspecified: Secondary | ICD-10-CM | POA: Diagnosis not present

## 2019-01-06 DIAGNOSIS — L97821 Non-pressure chronic ulcer of other part of left lower leg limited to breakdown of skin: Secondary | ICD-10-CM | POA: Diagnosis not present

## 2019-01-06 DIAGNOSIS — Z7982 Long term (current) use of aspirin: Secondary | ICD-10-CM | POA: Diagnosis not present

## 2019-01-06 DIAGNOSIS — L97212 Non-pressure chronic ulcer of right calf with fat layer exposed: Secondary | ICD-10-CM | POA: Diagnosis not present

## 2019-01-06 DIAGNOSIS — I87333 Chronic venous hypertension (idiopathic) with ulcer and inflammation of bilateral lower extremity: Secondary | ICD-10-CM | POA: Diagnosis not present

## 2019-01-07 DIAGNOSIS — L97821 Non-pressure chronic ulcer of other part of left lower leg limited to breakdown of skin: Secondary | ICD-10-CM | POA: Diagnosis not present

## 2019-01-07 DIAGNOSIS — I509 Heart failure, unspecified: Secondary | ICD-10-CM | POA: Diagnosis not present

## 2019-01-07 DIAGNOSIS — I890 Lymphedema, not elsewhere classified: Secondary | ICD-10-CM | POA: Diagnosis not present

## 2019-01-07 DIAGNOSIS — I87333 Chronic venous hypertension (idiopathic) with ulcer and inflammation of bilateral lower extremity: Secondary | ICD-10-CM | POA: Diagnosis not present

## 2019-01-07 DIAGNOSIS — L97212 Non-pressure chronic ulcer of right calf with fat layer exposed: Secondary | ICD-10-CM | POA: Diagnosis not present

## 2019-01-10 DIAGNOSIS — I87333 Chronic venous hypertension (idiopathic) with ulcer and inflammation of bilateral lower extremity: Secondary | ICD-10-CM | POA: Diagnosis not present

## 2019-01-10 DIAGNOSIS — L97212 Non-pressure chronic ulcer of right calf with fat layer exposed: Secondary | ICD-10-CM | POA: Diagnosis not present

## 2019-01-10 DIAGNOSIS — I890 Lymphedema, not elsewhere classified: Secondary | ICD-10-CM | POA: Diagnosis not present

## 2019-01-10 DIAGNOSIS — L97821 Non-pressure chronic ulcer of other part of left lower leg limited to breakdown of skin: Secondary | ICD-10-CM | POA: Diagnosis not present

## 2019-01-10 DIAGNOSIS — I509 Heart failure, unspecified: Secondary | ICD-10-CM | POA: Diagnosis not present

## 2019-01-14 DIAGNOSIS — L97212 Non-pressure chronic ulcer of right calf with fat layer exposed: Secondary | ICD-10-CM | POA: Diagnosis not present

## 2019-01-14 DIAGNOSIS — L97821 Non-pressure chronic ulcer of other part of left lower leg limited to breakdown of skin: Secondary | ICD-10-CM | POA: Diagnosis not present

## 2019-01-14 DIAGNOSIS — I87333 Chronic venous hypertension (idiopathic) with ulcer and inflammation of bilateral lower extremity: Secondary | ICD-10-CM | POA: Diagnosis not present

## 2019-01-14 DIAGNOSIS — I509 Heart failure, unspecified: Secondary | ICD-10-CM | POA: Diagnosis not present

## 2019-01-14 DIAGNOSIS — I890 Lymphedema, not elsewhere classified: Secondary | ICD-10-CM | POA: Diagnosis not present

## 2019-01-17 DIAGNOSIS — L97212 Non-pressure chronic ulcer of right calf with fat layer exposed: Secondary | ICD-10-CM | POA: Diagnosis not present

## 2019-01-17 DIAGNOSIS — I509 Heart failure, unspecified: Secondary | ICD-10-CM | POA: Diagnosis not present

## 2019-01-17 DIAGNOSIS — L97821 Non-pressure chronic ulcer of other part of left lower leg limited to breakdown of skin: Secondary | ICD-10-CM | POA: Diagnosis not present

## 2019-01-17 DIAGNOSIS — I890 Lymphedema, not elsewhere classified: Secondary | ICD-10-CM | POA: Diagnosis not present

## 2019-01-17 DIAGNOSIS — I87333 Chronic venous hypertension (idiopathic) with ulcer and inflammation of bilateral lower extremity: Secondary | ICD-10-CM | POA: Diagnosis not present

## 2019-01-21 DIAGNOSIS — L97212 Non-pressure chronic ulcer of right calf with fat layer exposed: Secondary | ICD-10-CM | POA: Diagnosis not present

## 2019-01-21 DIAGNOSIS — I509 Heart failure, unspecified: Secondary | ICD-10-CM | POA: Diagnosis not present

## 2019-01-21 DIAGNOSIS — I890 Lymphedema, not elsewhere classified: Secondary | ICD-10-CM | POA: Diagnosis not present

## 2019-01-21 DIAGNOSIS — L97821 Non-pressure chronic ulcer of other part of left lower leg limited to breakdown of skin: Secondary | ICD-10-CM | POA: Diagnosis not present

## 2019-01-21 DIAGNOSIS — I87333 Chronic venous hypertension (idiopathic) with ulcer and inflammation of bilateral lower extremity: Secondary | ICD-10-CM | POA: Diagnosis not present

## 2019-01-24 DIAGNOSIS — L97821 Non-pressure chronic ulcer of other part of left lower leg limited to breakdown of skin: Secondary | ICD-10-CM | POA: Diagnosis not present

## 2019-01-24 DIAGNOSIS — I87333 Chronic venous hypertension (idiopathic) with ulcer and inflammation of bilateral lower extremity: Secondary | ICD-10-CM | POA: Diagnosis not present

## 2019-01-24 DIAGNOSIS — I509 Heart failure, unspecified: Secondary | ICD-10-CM | POA: Diagnosis not present

## 2019-01-24 DIAGNOSIS — I890 Lymphedema, not elsewhere classified: Secondary | ICD-10-CM | POA: Diagnosis not present

## 2019-01-24 DIAGNOSIS — L97212 Non-pressure chronic ulcer of right calf with fat layer exposed: Secondary | ICD-10-CM | POA: Diagnosis not present

## 2019-01-28 ENCOUNTER — Encounter (HOSPITAL_BASED_OUTPATIENT_CLINIC_OR_DEPARTMENT_OTHER): Payer: Medicare Other | Attending: Internal Medicine

## 2019-01-28 DIAGNOSIS — I87323 Chronic venous hypertension (idiopathic) with inflammation of bilateral lower extremity: Secondary | ICD-10-CM | POA: Insufficient documentation

## 2019-01-28 DIAGNOSIS — L97219 Non-pressure chronic ulcer of right calf with unspecified severity: Secondary | ICD-10-CM | POA: Insufficient documentation

## 2019-01-28 DIAGNOSIS — I890 Lymphedema, not elsewhere classified: Secondary | ICD-10-CM | POA: Diagnosis not present

## 2019-01-28 DIAGNOSIS — I10 Essential (primary) hypertension: Secondary | ICD-10-CM | POA: Diagnosis not present

## 2019-01-28 DIAGNOSIS — I87313 Chronic venous hypertension (idiopathic) with ulcer of bilateral lower extremity: Secondary | ICD-10-CM | POA: Diagnosis not present

## 2019-01-28 DIAGNOSIS — L97819 Non-pressure chronic ulcer of other part of right lower leg with unspecified severity: Secondary | ICD-10-CM | POA: Insufficient documentation

## 2019-01-28 DIAGNOSIS — L97829 Non-pressure chronic ulcer of other part of left lower leg with unspecified severity: Secondary | ICD-10-CM | POA: Diagnosis not present

## 2019-01-28 DIAGNOSIS — G4730 Sleep apnea, unspecified: Secondary | ICD-10-CM | POA: Diagnosis not present

## 2019-01-31 DIAGNOSIS — I87333 Chronic venous hypertension (idiopathic) with ulcer and inflammation of bilateral lower extremity: Secondary | ICD-10-CM | POA: Diagnosis not present

## 2019-01-31 DIAGNOSIS — I509 Heart failure, unspecified: Secondary | ICD-10-CM | POA: Diagnosis not present

## 2019-01-31 DIAGNOSIS — L97212 Non-pressure chronic ulcer of right calf with fat layer exposed: Secondary | ICD-10-CM | POA: Diagnosis not present

## 2019-01-31 DIAGNOSIS — L97821 Non-pressure chronic ulcer of other part of left lower leg limited to breakdown of skin: Secondary | ICD-10-CM | POA: Diagnosis not present

## 2019-01-31 DIAGNOSIS — I890 Lymphedema, not elsewhere classified: Secondary | ICD-10-CM | POA: Diagnosis not present

## 2019-02-04 DIAGNOSIS — L97821 Non-pressure chronic ulcer of other part of left lower leg limited to breakdown of skin: Secondary | ICD-10-CM | POA: Diagnosis not present

## 2019-02-04 DIAGNOSIS — I87333 Chronic venous hypertension (idiopathic) with ulcer and inflammation of bilateral lower extremity: Secondary | ICD-10-CM | POA: Diagnosis not present

## 2019-02-04 DIAGNOSIS — L97212 Non-pressure chronic ulcer of right calf with fat layer exposed: Secondary | ICD-10-CM | POA: Diagnosis not present

## 2019-02-04 DIAGNOSIS — I509 Heart failure, unspecified: Secondary | ICD-10-CM | POA: Diagnosis not present

## 2019-02-04 DIAGNOSIS — I890 Lymphedema, not elsewhere classified: Secondary | ICD-10-CM | POA: Diagnosis not present

## 2019-02-05 DIAGNOSIS — I509 Heart failure, unspecified: Secondary | ICD-10-CM | POA: Diagnosis not present

## 2019-02-05 DIAGNOSIS — I87333 Chronic venous hypertension (idiopathic) with ulcer and inflammation of bilateral lower extremity: Secondary | ICD-10-CM | POA: Diagnosis not present

## 2019-02-05 DIAGNOSIS — L97821 Non-pressure chronic ulcer of other part of left lower leg limited to breakdown of skin: Secondary | ICD-10-CM | POA: Diagnosis not present

## 2019-02-05 DIAGNOSIS — Z7982 Long term (current) use of aspirin: Secondary | ICD-10-CM | POA: Diagnosis not present

## 2019-02-05 DIAGNOSIS — G4730 Sleep apnea, unspecified: Secondary | ICD-10-CM | POA: Diagnosis not present

## 2019-02-05 DIAGNOSIS — I890 Lymphedema, not elsewhere classified: Secondary | ICD-10-CM | POA: Diagnosis not present

## 2019-02-05 DIAGNOSIS — L97212 Non-pressure chronic ulcer of right calf with fat layer exposed: Secondary | ICD-10-CM | POA: Diagnosis not present

## 2019-02-07 DIAGNOSIS — I87333 Chronic venous hypertension (idiopathic) with ulcer and inflammation of bilateral lower extremity: Secondary | ICD-10-CM | POA: Diagnosis not present

## 2019-02-07 DIAGNOSIS — L97212 Non-pressure chronic ulcer of right calf with fat layer exposed: Secondary | ICD-10-CM | POA: Diagnosis not present

## 2019-02-07 DIAGNOSIS — I890 Lymphedema, not elsewhere classified: Secondary | ICD-10-CM | POA: Diagnosis not present

## 2019-02-07 DIAGNOSIS — L97821 Non-pressure chronic ulcer of other part of left lower leg limited to breakdown of skin: Secondary | ICD-10-CM | POA: Diagnosis not present

## 2019-02-07 DIAGNOSIS — I509 Heart failure, unspecified: Secondary | ICD-10-CM | POA: Diagnosis not present

## 2019-02-11 DIAGNOSIS — I509 Heart failure, unspecified: Secondary | ICD-10-CM | POA: Diagnosis not present

## 2019-02-11 DIAGNOSIS — I890 Lymphedema, not elsewhere classified: Secondary | ICD-10-CM | POA: Diagnosis not present

## 2019-02-11 DIAGNOSIS — L97821 Non-pressure chronic ulcer of other part of left lower leg limited to breakdown of skin: Secondary | ICD-10-CM | POA: Diagnosis not present

## 2019-02-11 DIAGNOSIS — L97212 Non-pressure chronic ulcer of right calf with fat layer exposed: Secondary | ICD-10-CM | POA: Diagnosis not present

## 2019-02-11 DIAGNOSIS — I87333 Chronic venous hypertension (idiopathic) with ulcer and inflammation of bilateral lower extremity: Secondary | ICD-10-CM | POA: Diagnosis not present

## 2019-02-14 DIAGNOSIS — L97212 Non-pressure chronic ulcer of right calf with fat layer exposed: Secondary | ICD-10-CM | POA: Diagnosis not present

## 2019-02-14 DIAGNOSIS — I87333 Chronic venous hypertension (idiopathic) with ulcer and inflammation of bilateral lower extremity: Secondary | ICD-10-CM | POA: Diagnosis not present

## 2019-02-14 DIAGNOSIS — L97821 Non-pressure chronic ulcer of other part of left lower leg limited to breakdown of skin: Secondary | ICD-10-CM | POA: Diagnosis not present

## 2019-02-14 DIAGNOSIS — I890 Lymphedema, not elsewhere classified: Secondary | ICD-10-CM | POA: Diagnosis not present

## 2019-02-14 DIAGNOSIS — I509 Heart failure, unspecified: Secondary | ICD-10-CM | POA: Diagnosis not present

## 2019-02-15 DIAGNOSIS — I10 Essential (primary) hypertension: Secondary | ICD-10-CM | POA: Diagnosis not present

## 2019-02-15 DIAGNOSIS — E7800 Pure hypercholesterolemia, unspecified: Secondary | ICD-10-CM | POA: Diagnosis not present

## 2019-02-18 DIAGNOSIS — I890 Lymphedema, not elsewhere classified: Secondary | ICD-10-CM | POA: Diagnosis not present

## 2019-02-18 DIAGNOSIS — L97821 Non-pressure chronic ulcer of other part of left lower leg limited to breakdown of skin: Secondary | ICD-10-CM | POA: Diagnosis not present

## 2019-02-18 DIAGNOSIS — L97212 Non-pressure chronic ulcer of right calf with fat layer exposed: Secondary | ICD-10-CM | POA: Diagnosis not present

## 2019-02-18 DIAGNOSIS — I509 Heart failure, unspecified: Secondary | ICD-10-CM | POA: Diagnosis not present

## 2019-02-18 DIAGNOSIS — I87333 Chronic venous hypertension (idiopathic) with ulcer and inflammation of bilateral lower extremity: Secondary | ICD-10-CM | POA: Diagnosis not present

## 2019-02-21 DIAGNOSIS — L97821 Non-pressure chronic ulcer of other part of left lower leg limited to breakdown of skin: Secondary | ICD-10-CM | POA: Diagnosis not present

## 2019-02-21 DIAGNOSIS — I890 Lymphedema, not elsewhere classified: Secondary | ICD-10-CM | POA: Diagnosis not present

## 2019-02-21 DIAGNOSIS — L97212 Non-pressure chronic ulcer of right calf with fat layer exposed: Secondary | ICD-10-CM | POA: Diagnosis not present

## 2019-02-21 DIAGNOSIS — I87333 Chronic venous hypertension (idiopathic) with ulcer and inflammation of bilateral lower extremity: Secondary | ICD-10-CM | POA: Diagnosis not present

## 2019-02-21 DIAGNOSIS — I509 Heart failure, unspecified: Secondary | ICD-10-CM | POA: Diagnosis not present

## 2019-02-25 DIAGNOSIS — I87333 Chronic venous hypertension (idiopathic) with ulcer and inflammation of bilateral lower extremity: Secondary | ICD-10-CM | POA: Diagnosis not present

## 2019-02-25 DIAGNOSIS — I890 Lymphedema, not elsewhere classified: Secondary | ICD-10-CM | POA: Diagnosis not present

## 2019-02-25 DIAGNOSIS — L97821 Non-pressure chronic ulcer of other part of left lower leg limited to breakdown of skin: Secondary | ICD-10-CM | POA: Diagnosis not present

## 2019-02-25 DIAGNOSIS — I509 Heart failure, unspecified: Secondary | ICD-10-CM | POA: Diagnosis not present

## 2019-02-25 DIAGNOSIS — L97212 Non-pressure chronic ulcer of right calf with fat layer exposed: Secondary | ICD-10-CM | POA: Diagnosis not present

## 2019-02-28 ENCOUNTER — Encounter (HOSPITAL_BASED_OUTPATIENT_CLINIC_OR_DEPARTMENT_OTHER): Payer: Medicare Other | Attending: Internal Medicine

## 2019-02-28 DIAGNOSIS — S81802A Unspecified open wound, left lower leg, initial encounter: Secondary | ICD-10-CM | POA: Diagnosis not present

## 2019-02-28 DIAGNOSIS — L97822 Non-pressure chronic ulcer of other part of left lower leg with fat layer exposed: Secondary | ICD-10-CM | POA: Insufficient documentation

## 2019-02-28 DIAGNOSIS — G4730 Sleep apnea, unspecified: Secondary | ICD-10-CM | POA: Insufficient documentation

## 2019-02-28 DIAGNOSIS — I10 Essential (primary) hypertension: Secondary | ICD-10-CM | POA: Insufficient documentation

## 2019-02-28 DIAGNOSIS — L97819 Non-pressure chronic ulcer of other part of right lower leg with unspecified severity: Secondary | ICD-10-CM | POA: Diagnosis not present

## 2019-02-28 DIAGNOSIS — I890 Lymphedema, not elsewhere classified: Secondary | ICD-10-CM | POA: Insufficient documentation

## 2019-02-28 DIAGNOSIS — L97219 Non-pressure chronic ulcer of right calf with unspecified severity: Secondary | ICD-10-CM | POA: Diagnosis not present

## 2019-02-28 DIAGNOSIS — I87311 Chronic venous hypertension (idiopathic) with ulcer of right lower extremity: Secondary | ICD-10-CM | POA: Diagnosis not present

## 2019-03-04 DIAGNOSIS — I890 Lymphedema, not elsewhere classified: Secondary | ICD-10-CM | POA: Diagnosis not present

## 2019-03-04 DIAGNOSIS — I87333 Chronic venous hypertension (idiopathic) with ulcer and inflammation of bilateral lower extremity: Secondary | ICD-10-CM | POA: Diagnosis not present

## 2019-03-04 DIAGNOSIS — L97212 Non-pressure chronic ulcer of right calf with fat layer exposed: Secondary | ICD-10-CM | POA: Diagnosis not present

## 2019-03-04 DIAGNOSIS — I509 Heart failure, unspecified: Secondary | ICD-10-CM | POA: Diagnosis not present

## 2019-03-04 DIAGNOSIS — L97821 Non-pressure chronic ulcer of other part of left lower leg limited to breakdown of skin: Secondary | ICD-10-CM | POA: Diagnosis not present

## 2019-03-07 DIAGNOSIS — I509 Heart failure, unspecified: Secondary | ICD-10-CM | POA: Diagnosis not present

## 2019-03-07 DIAGNOSIS — I87333 Chronic venous hypertension (idiopathic) with ulcer and inflammation of bilateral lower extremity: Secondary | ICD-10-CM | POA: Diagnosis not present

## 2019-03-07 DIAGNOSIS — G4730 Sleep apnea, unspecified: Secondary | ICD-10-CM | POA: Diagnosis not present

## 2019-03-07 DIAGNOSIS — Z7982 Long term (current) use of aspirin: Secondary | ICD-10-CM | POA: Diagnosis not present

## 2019-03-07 DIAGNOSIS — L97212 Non-pressure chronic ulcer of right calf with fat layer exposed: Secondary | ICD-10-CM | POA: Diagnosis not present

## 2019-03-07 DIAGNOSIS — I890 Lymphedema, not elsewhere classified: Secondary | ICD-10-CM | POA: Diagnosis not present

## 2019-03-07 DIAGNOSIS — L97821 Non-pressure chronic ulcer of other part of left lower leg limited to breakdown of skin: Secondary | ICD-10-CM | POA: Diagnosis not present

## 2019-03-11 DIAGNOSIS — I87333 Chronic venous hypertension (idiopathic) with ulcer and inflammation of bilateral lower extremity: Secondary | ICD-10-CM | POA: Diagnosis not present

## 2019-03-11 DIAGNOSIS — I509 Heart failure, unspecified: Secondary | ICD-10-CM | POA: Diagnosis not present

## 2019-03-11 DIAGNOSIS — L97212 Non-pressure chronic ulcer of right calf with fat layer exposed: Secondary | ICD-10-CM | POA: Diagnosis not present

## 2019-03-11 DIAGNOSIS — L97821 Non-pressure chronic ulcer of other part of left lower leg limited to breakdown of skin: Secondary | ICD-10-CM | POA: Diagnosis not present

## 2019-03-11 DIAGNOSIS — I890 Lymphedema, not elsewhere classified: Secondary | ICD-10-CM | POA: Diagnosis not present

## 2019-03-14 DIAGNOSIS — L97821 Non-pressure chronic ulcer of other part of left lower leg limited to breakdown of skin: Secondary | ICD-10-CM | POA: Diagnosis not present

## 2019-03-14 DIAGNOSIS — L97212 Non-pressure chronic ulcer of right calf with fat layer exposed: Secondary | ICD-10-CM | POA: Diagnosis not present

## 2019-03-14 DIAGNOSIS — I890 Lymphedema, not elsewhere classified: Secondary | ICD-10-CM | POA: Diagnosis not present

## 2019-03-14 DIAGNOSIS — I87333 Chronic venous hypertension (idiopathic) with ulcer and inflammation of bilateral lower extremity: Secondary | ICD-10-CM | POA: Diagnosis not present

## 2019-03-14 DIAGNOSIS — I509 Heart failure, unspecified: Secondary | ICD-10-CM | POA: Diagnosis not present

## 2019-03-18 DIAGNOSIS — L97212 Non-pressure chronic ulcer of right calf with fat layer exposed: Secondary | ICD-10-CM | POA: Diagnosis not present

## 2019-03-18 DIAGNOSIS — I10 Essential (primary) hypertension: Secondary | ICD-10-CM | POA: Diagnosis not present

## 2019-03-18 DIAGNOSIS — E7800 Pure hypercholesterolemia, unspecified: Secondary | ICD-10-CM | POA: Diagnosis not present

## 2019-03-18 DIAGNOSIS — I890 Lymphedema, not elsewhere classified: Secondary | ICD-10-CM | POA: Diagnosis not present

## 2019-03-18 DIAGNOSIS — I87333 Chronic venous hypertension (idiopathic) with ulcer and inflammation of bilateral lower extremity: Secondary | ICD-10-CM | POA: Diagnosis not present

## 2019-03-18 DIAGNOSIS — I509 Heart failure, unspecified: Secondary | ICD-10-CM | POA: Diagnosis not present

## 2019-03-18 DIAGNOSIS — L97821 Non-pressure chronic ulcer of other part of left lower leg limited to breakdown of skin: Secondary | ICD-10-CM | POA: Diagnosis not present

## 2019-03-21 DIAGNOSIS — I890 Lymphedema, not elsewhere classified: Secondary | ICD-10-CM | POA: Diagnosis not present

## 2019-03-21 DIAGNOSIS — L97821 Non-pressure chronic ulcer of other part of left lower leg limited to breakdown of skin: Secondary | ICD-10-CM | POA: Diagnosis not present

## 2019-03-21 DIAGNOSIS — I87333 Chronic venous hypertension (idiopathic) with ulcer and inflammation of bilateral lower extremity: Secondary | ICD-10-CM | POA: Diagnosis not present

## 2019-03-21 DIAGNOSIS — I509 Heart failure, unspecified: Secondary | ICD-10-CM | POA: Diagnosis not present

## 2019-03-21 DIAGNOSIS — L97212 Non-pressure chronic ulcer of right calf with fat layer exposed: Secondary | ICD-10-CM | POA: Diagnosis not present

## 2019-03-21 DIAGNOSIS — L03116 Cellulitis of left lower limb: Secondary | ICD-10-CM | POA: Diagnosis not present

## 2019-03-25 DIAGNOSIS — I87333 Chronic venous hypertension (idiopathic) with ulcer and inflammation of bilateral lower extremity: Secondary | ICD-10-CM | POA: Diagnosis not present

## 2019-03-25 DIAGNOSIS — L97821 Non-pressure chronic ulcer of other part of left lower leg limited to breakdown of skin: Secondary | ICD-10-CM | POA: Diagnosis not present

## 2019-03-25 DIAGNOSIS — I509 Heart failure, unspecified: Secondary | ICD-10-CM | POA: Diagnosis not present

## 2019-03-25 DIAGNOSIS — I890 Lymphedema, not elsewhere classified: Secondary | ICD-10-CM | POA: Diagnosis not present

## 2019-03-25 DIAGNOSIS — L97212 Non-pressure chronic ulcer of right calf with fat layer exposed: Secondary | ICD-10-CM | POA: Diagnosis not present

## 2019-03-28 ENCOUNTER — Encounter (HOSPITAL_BASED_OUTPATIENT_CLINIC_OR_DEPARTMENT_OTHER): Payer: Medicare Other | Attending: Internal Medicine

## 2019-03-28 DIAGNOSIS — L97521 Non-pressure chronic ulcer of other part of left foot limited to breakdown of skin: Secondary | ICD-10-CM | POA: Diagnosis not present

## 2019-03-28 DIAGNOSIS — I878 Other specified disorders of veins: Secondary | ICD-10-CM | POA: Diagnosis not present

## 2019-03-28 DIAGNOSIS — I890 Lymphedema, not elsewhere classified: Secondary | ICD-10-CM | POA: Diagnosis not present

## 2019-03-28 DIAGNOSIS — R7303 Prediabetes: Secondary | ICD-10-CM | POA: Diagnosis not present

## 2019-03-28 DIAGNOSIS — B070 Plantar wart: Secondary | ICD-10-CM | POA: Diagnosis not present

## 2019-03-28 DIAGNOSIS — Z8673 Personal history of transient ischemic attack (TIA), and cerebral infarction without residual deficits: Secondary | ICD-10-CM | POA: Diagnosis not present

## 2019-04-01 DIAGNOSIS — L97212 Non-pressure chronic ulcer of right calf with fat layer exposed: Secondary | ICD-10-CM | POA: Diagnosis not present

## 2019-04-01 DIAGNOSIS — I87333 Chronic venous hypertension (idiopathic) with ulcer and inflammation of bilateral lower extremity: Secondary | ICD-10-CM | POA: Diagnosis not present

## 2019-04-01 DIAGNOSIS — I509 Heart failure, unspecified: Secondary | ICD-10-CM | POA: Diagnosis not present

## 2019-04-01 DIAGNOSIS — I890 Lymphedema, not elsewhere classified: Secondary | ICD-10-CM | POA: Diagnosis not present

## 2019-04-01 DIAGNOSIS — L97821 Non-pressure chronic ulcer of other part of left lower leg limited to breakdown of skin: Secondary | ICD-10-CM | POA: Diagnosis not present

## 2019-04-04 DIAGNOSIS — I87333 Chronic venous hypertension (idiopathic) with ulcer and inflammation of bilateral lower extremity: Secondary | ICD-10-CM | POA: Diagnosis not present

## 2019-04-04 DIAGNOSIS — I890 Lymphedema, not elsewhere classified: Secondary | ICD-10-CM | POA: Diagnosis not present

## 2019-04-04 DIAGNOSIS — L97821 Non-pressure chronic ulcer of other part of left lower leg limited to breakdown of skin: Secondary | ICD-10-CM | POA: Diagnosis not present

## 2019-04-04 DIAGNOSIS — I509 Heart failure, unspecified: Secondary | ICD-10-CM | POA: Diagnosis not present

## 2019-04-04 DIAGNOSIS — L97212 Non-pressure chronic ulcer of right calf with fat layer exposed: Secondary | ICD-10-CM | POA: Diagnosis not present

## 2019-04-06 DIAGNOSIS — I509 Heart failure, unspecified: Secondary | ICD-10-CM | POA: Diagnosis not present

## 2019-04-06 DIAGNOSIS — I87333 Chronic venous hypertension (idiopathic) with ulcer and inflammation of bilateral lower extremity: Secondary | ICD-10-CM | POA: Diagnosis not present

## 2019-04-06 DIAGNOSIS — Z7982 Long term (current) use of aspirin: Secondary | ICD-10-CM | POA: Diagnosis not present

## 2019-04-06 DIAGNOSIS — I890 Lymphedema, not elsewhere classified: Secondary | ICD-10-CM | POA: Diagnosis not present

## 2019-04-06 DIAGNOSIS — G4730 Sleep apnea, unspecified: Secondary | ICD-10-CM | POA: Diagnosis not present

## 2019-04-06 DIAGNOSIS — L97212 Non-pressure chronic ulcer of right calf with fat layer exposed: Secondary | ICD-10-CM | POA: Diagnosis not present

## 2019-04-06 DIAGNOSIS — L97821 Non-pressure chronic ulcer of other part of left lower leg limited to breakdown of skin: Secondary | ICD-10-CM | POA: Diagnosis not present

## 2019-04-07 DIAGNOSIS — E1121 Type 2 diabetes mellitus with diabetic nephropathy: Secondary | ICD-10-CM | POA: Diagnosis not present

## 2019-04-07 DIAGNOSIS — E7800 Pure hypercholesterolemia, unspecified: Secondary | ICD-10-CM | POA: Diagnosis not present

## 2019-04-07 DIAGNOSIS — I10 Essential (primary) hypertension: Secondary | ICD-10-CM | POA: Diagnosis not present

## 2019-04-07 DIAGNOSIS — G4733 Obstructive sleep apnea (adult) (pediatric): Secondary | ICD-10-CM | POA: Diagnosis not present

## 2019-04-07 DIAGNOSIS — I5032 Chronic diastolic (congestive) heart failure: Secondary | ICD-10-CM | POA: Diagnosis not present

## 2019-04-07 DIAGNOSIS — E1129 Type 2 diabetes mellitus with other diabetic kidney complication: Secondary | ICD-10-CM | POA: Diagnosis not present

## 2019-04-08 DIAGNOSIS — L97821 Non-pressure chronic ulcer of other part of left lower leg limited to breakdown of skin: Secondary | ICD-10-CM | POA: Diagnosis not present

## 2019-04-08 DIAGNOSIS — I87333 Chronic venous hypertension (idiopathic) with ulcer and inflammation of bilateral lower extremity: Secondary | ICD-10-CM | POA: Diagnosis not present

## 2019-04-08 DIAGNOSIS — L97212 Non-pressure chronic ulcer of right calf with fat layer exposed: Secondary | ICD-10-CM | POA: Diagnosis not present

## 2019-04-08 DIAGNOSIS — I509 Heart failure, unspecified: Secondary | ICD-10-CM | POA: Diagnosis not present

## 2019-04-08 DIAGNOSIS — I890 Lymphedema, not elsewhere classified: Secondary | ICD-10-CM | POA: Diagnosis not present

## 2019-04-10 DIAGNOSIS — I10 Essential (primary) hypertension: Secondary | ICD-10-CM | POA: Diagnosis not present

## 2019-04-10 DIAGNOSIS — E1129 Type 2 diabetes mellitus with other diabetic kidney complication: Secondary | ICD-10-CM | POA: Diagnosis not present

## 2019-04-10 DIAGNOSIS — Z6841 Body Mass Index (BMI) 40.0 and over, adult: Secondary | ICD-10-CM | POA: Diagnosis not present

## 2019-04-10 DIAGNOSIS — G4733 Obstructive sleep apnea (adult) (pediatric): Secondary | ICD-10-CM | POA: Diagnosis not present

## 2019-04-10 DIAGNOSIS — Z8673 Personal history of transient ischemic attack (TIA), and cerebral infarction without residual deficits: Secondary | ICD-10-CM | POA: Diagnosis not present

## 2019-04-10 DIAGNOSIS — E7800 Pure hypercholesterolemia, unspecified: Secondary | ICD-10-CM | POA: Diagnosis not present

## 2019-04-10 DIAGNOSIS — R296 Repeated falls: Secondary | ICD-10-CM | POA: Diagnosis not present

## 2019-04-10 DIAGNOSIS — E1121 Type 2 diabetes mellitus with diabetic nephropathy: Secondary | ICD-10-CM | POA: Diagnosis not present

## 2019-04-11 DIAGNOSIS — L97521 Non-pressure chronic ulcer of other part of left foot limited to breakdown of skin: Secondary | ICD-10-CM | POA: Diagnosis not present

## 2019-04-11 DIAGNOSIS — L97821 Non-pressure chronic ulcer of other part of left lower leg limited to breakdown of skin: Secondary | ICD-10-CM | POA: Diagnosis not present

## 2019-04-11 DIAGNOSIS — I890 Lymphedema, not elsewhere classified: Secondary | ICD-10-CM | POA: Diagnosis not present

## 2019-04-11 DIAGNOSIS — I872 Venous insufficiency (chronic) (peripheral): Secondary | ICD-10-CM | POA: Diagnosis not present

## 2019-04-11 DIAGNOSIS — L97811 Non-pressure chronic ulcer of other part of right lower leg limited to breakdown of skin: Secondary | ICD-10-CM | POA: Diagnosis not present

## 2019-04-15 DIAGNOSIS — L97212 Non-pressure chronic ulcer of right calf with fat layer exposed: Secondary | ICD-10-CM | POA: Diagnosis not present

## 2019-04-15 DIAGNOSIS — L97821 Non-pressure chronic ulcer of other part of left lower leg limited to breakdown of skin: Secondary | ICD-10-CM | POA: Diagnosis not present

## 2019-04-15 DIAGNOSIS — I890 Lymphedema, not elsewhere classified: Secondary | ICD-10-CM | POA: Diagnosis not present

## 2019-04-15 DIAGNOSIS — I87333 Chronic venous hypertension (idiopathic) with ulcer and inflammation of bilateral lower extremity: Secondary | ICD-10-CM | POA: Diagnosis not present

## 2019-04-15 DIAGNOSIS — I509 Heart failure, unspecified: Secondary | ICD-10-CM | POA: Diagnosis not present

## 2019-04-18 DIAGNOSIS — L97821 Non-pressure chronic ulcer of other part of left lower leg limited to breakdown of skin: Secondary | ICD-10-CM | POA: Diagnosis not present

## 2019-04-18 DIAGNOSIS — I890 Lymphedema, not elsewhere classified: Secondary | ICD-10-CM | POA: Diagnosis not present

## 2019-04-18 DIAGNOSIS — I87333 Chronic venous hypertension (idiopathic) with ulcer and inflammation of bilateral lower extremity: Secondary | ICD-10-CM | POA: Diagnosis not present

## 2019-04-18 DIAGNOSIS — E119 Type 2 diabetes mellitus without complications: Secondary | ICD-10-CM | POA: Diagnosis not present

## 2019-04-18 DIAGNOSIS — E785 Hyperlipidemia, unspecified: Secondary | ICD-10-CM | POA: Diagnosis not present

## 2019-04-18 DIAGNOSIS — I509 Heart failure, unspecified: Secondary | ICD-10-CM | POA: Diagnosis not present

## 2019-04-18 DIAGNOSIS — L97212 Non-pressure chronic ulcer of right calf with fat layer exposed: Secondary | ICD-10-CM | POA: Diagnosis not present

## 2019-04-18 DIAGNOSIS — I10 Essential (primary) hypertension: Secondary | ICD-10-CM | POA: Diagnosis not present

## 2019-04-22 DIAGNOSIS — L97821 Non-pressure chronic ulcer of other part of left lower leg limited to breakdown of skin: Secondary | ICD-10-CM | POA: Diagnosis not present

## 2019-04-22 DIAGNOSIS — L97212 Non-pressure chronic ulcer of right calf with fat layer exposed: Secondary | ICD-10-CM | POA: Diagnosis not present

## 2019-04-22 DIAGNOSIS — I890 Lymphedema, not elsewhere classified: Secondary | ICD-10-CM | POA: Diagnosis not present

## 2019-04-22 DIAGNOSIS — I87333 Chronic venous hypertension (idiopathic) with ulcer and inflammation of bilateral lower extremity: Secondary | ICD-10-CM | POA: Diagnosis not present

## 2019-04-22 DIAGNOSIS — I509 Heart failure, unspecified: Secondary | ICD-10-CM | POA: Diagnosis not present

## 2019-04-25 DIAGNOSIS — L97811 Non-pressure chronic ulcer of other part of right lower leg limited to breakdown of skin: Secondary | ICD-10-CM | POA: Diagnosis not present

## 2019-04-25 DIAGNOSIS — I87333 Chronic venous hypertension (idiopathic) with ulcer and inflammation of bilateral lower extremity: Secondary | ICD-10-CM | POA: Diagnosis not present

## 2019-04-25 DIAGNOSIS — I890 Lymphedema, not elsewhere classified: Secondary | ICD-10-CM | POA: Diagnosis not present

## 2019-04-25 DIAGNOSIS — L97821 Non-pressure chronic ulcer of other part of left lower leg limited to breakdown of skin: Secondary | ICD-10-CM | POA: Diagnosis not present

## 2019-04-25 DIAGNOSIS — L97212 Non-pressure chronic ulcer of right calf with fat layer exposed: Secondary | ICD-10-CM | POA: Diagnosis not present

## 2019-04-25 DIAGNOSIS — I509 Heart failure, unspecified: Secondary | ICD-10-CM | POA: Diagnosis not present

## 2019-04-25 DIAGNOSIS — L97521 Non-pressure chronic ulcer of other part of left foot limited to breakdown of skin: Secondary | ICD-10-CM | POA: Diagnosis not present

## 2019-04-29 DIAGNOSIS — L97212 Non-pressure chronic ulcer of right calf with fat layer exposed: Secondary | ICD-10-CM | POA: Diagnosis not present

## 2019-04-29 DIAGNOSIS — I87333 Chronic venous hypertension (idiopathic) with ulcer and inflammation of bilateral lower extremity: Secondary | ICD-10-CM | POA: Diagnosis not present

## 2019-04-29 DIAGNOSIS — I509 Heart failure, unspecified: Secondary | ICD-10-CM | POA: Diagnosis not present

## 2019-04-29 DIAGNOSIS — I890 Lymphedema, not elsewhere classified: Secondary | ICD-10-CM | POA: Diagnosis not present

## 2019-04-29 DIAGNOSIS — L97821 Non-pressure chronic ulcer of other part of left lower leg limited to breakdown of skin: Secondary | ICD-10-CM | POA: Diagnosis not present

## 2019-05-01 DIAGNOSIS — L97821 Non-pressure chronic ulcer of other part of left lower leg limited to breakdown of skin: Secondary | ICD-10-CM | POA: Diagnosis not present

## 2019-05-01 DIAGNOSIS — L97212 Non-pressure chronic ulcer of right calf with fat layer exposed: Secondary | ICD-10-CM | POA: Diagnosis not present

## 2019-05-01 DIAGNOSIS — I509 Heart failure, unspecified: Secondary | ICD-10-CM | POA: Diagnosis not present

## 2019-05-01 DIAGNOSIS — I87333 Chronic venous hypertension (idiopathic) with ulcer and inflammation of bilateral lower extremity: Secondary | ICD-10-CM | POA: Diagnosis not present

## 2019-05-01 DIAGNOSIS — I890 Lymphedema, not elsewhere classified: Secondary | ICD-10-CM | POA: Diagnosis not present

## 2019-05-06 DIAGNOSIS — G4730 Sleep apnea, unspecified: Secondary | ICD-10-CM | POA: Diagnosis not present

## 2019-05-06 DIAGNOSIS — Z7982 Long term (current) use of aspirin: Secondary | ICD-10-CM | POA: Diagnosis not present

## 2019-05-06 DIAGNOSIS — L97821 Non-pressure chronic ulcer of other part of left lower leg limited to breakdown of skin: Secondary | ICD-10-CM | POA: Diagnosis not present

## 2019-05-06 DIAGNOSIS — I87333 Chronic venous hypertension (idiopathic) with ulcer and inflammation of bilateral lower extremity: Secondary | ICD-10-CM | POA: Diagnosis not present

## 2019-05-06 DIAGNOSIS — L97212 Non-pressure chronic ulcer of right calf with fat layer exposed: Secondary | ICD-10-CM | POA: Diagnosis not present

## 2019-05-06 DIAGNOSIS — I509 Heart failure, unspecified: Secondary | ICD-10-CM | POA: Diagnosis not present

## 2019-05-06 DIAGNOSIS — I890 Lymphedema, not elsewhere classified: Secondary | ICD-10-CM | POA: Diagnosis not present

## 2019-05-09 DIAGNOSIS — I509 Heart failure, unspecified: Secondary | ICD-10-CM | POA: Diagnosis not present

## 2019-05-09 DIAGNOSIS — L97212 Non-pressure chronic ulcer of right calf with fat layer exposed: Secondary | ICD-10-CM | POA: Diagnosis not present

## 2019-05-09 DIAGNOSIS — I890 Lymphedema, not elsewhere classified: Secondary | ICD-10-CM | POA: Diagnosis not present

## 2019-05-09 DIAGNOSIS — I87333 Chronic venous hypertension (idiopathic) with ulcer and inflammation of bilateral lower extremity: Secondary | ICD-10-CM | POA: Diagnosis not present

## 2019-05-09 DIAGNOSIS — L97821 Non-pressure chronic ulcer of other part of left lower leg limited to breakdown of skin: Secondary | ICD-10-CM | POA: Diagnosis not present

## 2019-05-13 DIAGNOSIS — I890 Lymphedema, not elsewhere classified: Secondary | ICD-10-CM | POA: Diagnosis not present

## 2019-05-13 DIAGNOSIS — L97212 Non-pressure chronic ulcer of right calf with fat layer exposed: Secondary | ICD-10-CM | POA: Diagnosis not present

## 2019-05-13 DIAGNOSIS — L97821 Non-pressure chronic ulcer of other part of left lower leg limited to breakdown of skin: Secondary | ICD-10-CM | POA: Diagnosis not present

## 2019-05-13 DIAGNOSIS — I509 Heart failure, unspecified: Secondary | ICD-10-CM | POA: Diagnosis not present

## 2019-05-13 DIAGNOSIS — I87333 Chronic venous hypertension (idiopathic) with ulcer and inflammation of bilateral lower extremity: Secondary | ICD-10-CM | POA: Diagnosis not present

## 2019-05-16 DIAGNOSIS — I87333 Chronic venous hypertension (idiopathic) with ulcer and inflammation of bilateral lower extremity: Secondary | ICD-10-CM | POA: Diagnosis not present

## 2019-05-16 DIAGNOSIS — L97212 Non-pressure chronic ulcer of right calf with fat layer exposed: Secondary | ICD-10-CM | POA: Diagnosis not present

## 2019-05-16 DIAGNOSIS — I890 Lymphedema, not elsewhere classified: Secondary | ICD-10-CM | POA: Diagnosis not present

## 2019-05-16 DIAGNOSIS — L97821 Non-pressure chronic ulcer of other part of left lower leg limited to breakdown of skin: Secondary | ICD-10-CM | POA: Diagnosis not present

## 2019-05-16 DIAGNOSIS — I509 Heart failure, unspecified: Secondary | ICD-10-CM | POA: Diagnosis not present

## 2019-05-20 DIAGNOSIS — I890 Lymphedema, not elsewhere classified: Secondary | ICD-10-CM | POA: Diagnosis not present

## 2019-05-20 DIAGNOSIS — I87333 Chronic venous hypertension (idiopathic) with ulcer and inflammation of bilateral lower extremity: Secondary | ICD-10-CM | POA: Diagnosis not present

## 2019-05-20 DIAGNOSIS — I509 Heart failure, unspecified: Secondary | ICD-10-CM | POA: Diagnosis not present

## 2019-05-20 DIAGNOSIS — L97821 Non-pressure chronic ulcer of other part of left lower leg limited to breakdown of skin: Secondary | ICD-10-CM | POA: Diagnosis not present

## 2019-05-20 DIAGNOSIS — L97212 Non-pressure chronic ulcer of right calf with fat layer exposed: Secondary | ICD-10-CM | POA: Diagnosis not present

## 2019-05-23 DIAGNOSIS — I87333 Chronic venous hypertension (idiopathic) with ulcer and inflammation of bilateral lower extremity: Secondary | ICD-10-CM | POA: Diagnosis not present

## 2019-05-23 DIAGNOSIS — L97212 Non-pressure chronic ulcer of right calf with fat layer exposed: Secondary | ICD-10-CM | POA: Diagnosis not present

## 2019-05-23 DIAGNOSIS — I509 Heart failure, unspecified: Secondary | ICD-10-CM | POA: Diagnosis not present

## 2019-05-23 DIAGNOSIS — I890 Lymphedema, not elsewhere classified: Secondary | ICD-10-CM | POA: Diagnosis not present

## 2019-05-23 DIAGNOSIS — L97821 Non-pressure chronic ulcer of other part of left lower leg limited to breakdown of skin: Secondary | ICD-10-CM | POA: Diagnosis not present

## 2019-05-27 DIAGNOSIS — I890 Lymphedema, not elsewhere classified: Secondary | ICD-10-CM | POA: Diagnosis not present

## 2019-05-27 DIAGNOSIS — L97212 Non-pressure chronic ulcer of right calf with fat layer exposed: Secondary | ICD-10-CM | POA: Diagnosis not present

## 2019-05-27 DIAGNOSIS — I509 Heart failure, unspecified: Secondary | ICD-10-CM | POA: Diagnosis not present

## 2019-05-27 DIAGNOSIS — L97821 Non-pressure chronic ulcer of other part of left lower leg limited to breakdown of skin: Secondary | ICD-10-CM | POA: Diagnosis not present

## 2019-05-27 DIAGNOSIS — I87333 Chronic venous hypertension (idiopathic) with ulcer and inflammation of bilateral lower extremity: Secondary | ICD-10-CM | POA: Diagnosis not present

## 2019-05-30 DIAGNOSIS — I509 Heart failure, unspecified: Secondary | ICD-10-CM | POA: Diagnosis not present

## 2019-05-30 DIAGNOSIS — L97821 Non-pressure chronic ulcer of other part of left lower leg limited to breakdown of skin: Secondary | ICD-10-CM | POA: Diagnosis not present

## 2019-05-30 DIAGNOSIS — I87333 Chronic venous hypertension (idiopathic) with ulcer and inflammation of bilateral lower extremity: Secondary | ICD-10-CM | POA: Diagnosis not present

## 2019-05-30 DIAGNOSIS — I890 Lymphedema, not elsewhere classified: Secondary | ICD-10-CM | POA: Diagnosis not present

## 2019-05-30 DIAGNOSIS — L97212 Non-pressure chronic ulcer of right calf with fat layer exposed: Secondary | ICD-10-CM | POA: Diagnosis not present

## 2019-06-03 DIAGNOSIS — I87333 Chronic venous hypertension (idiopathic) with ulcer and inflammation of bilateral lower extremity: Secondary | ICD-10-CM | POA: Diagnosis not present

## 2019-06-03 DIAGNOSIS — L97212 Non-pressure chronic ulcer of right calf with fat layer exposed: Secondary | ICD-10-CM | POA: Diagnosis not present

## 2019-06-03 DIAGNOSIS — I890 Lymphedema, not elsewhere classified: Secondary | ICD-10-CM | POA: Diagnosis not present

## 2019-06-03 DIAGNOSIS — L97821 Non-pressure chronic ulcer of other part of left lower leg limited to breakdown of skin: Secondary | ICD-10-CM | POA: Diagnosis not present

## 2019-06-03 DIAGNOSIS — I509 Heart failure, unspecified: Secondary | ICD-10-CM | POA: Diagnosis not present

## 2019-06-05 DIAGNOSIS — Z7982 Long term (current) use of aspirin: Secondary | ICD-10-CM | POA: Diagnosis not present

## 2019-06-05 DIAGNOSIS — L97212 Non-pressure chronic ulcer of right calf with fat layer exposed: Secondary | ICD-10-CM | POA: Diagnosis not present

## 2019-06-05 DIAGNOSIS — I890 Lymphedema, not elsewhere classified: Secondary | ICD-10-CM | POA: Diagnosis not present

## 2019-06-05 DIAGNOSIS — I509 Heart failure, unspecified: Secondary | ICD-10-CM | POA: Diagnosis not present

## 2019-06-05 DIAGNOSIS — L97821 Non-pressure chronic ulcer of other part of left lower leg limited to breakdown of skin: Secondary | ICD-10-CM | POA: Diagnosis not present

## 2019-06-05 DIAGNOSIS — I87333 Chronic venous hypertension (idiopathic) with ulcer and inflammation of bilateral lower extremity: Secondary | ICD-10-CM | POA: Diagnosis not present

## 2019-06-05 DIAGNOSIS — G4730 Sleep apnea, unspecified: Secondary | ICD-10-CM | POA: Diagnosis not present

## 2019-06-06 DIAGNOSIS — I509 Heart failure, unspecified: Secondary | ICD-10-CM | POA: Diagnosis not present

## 2019-06-06 DIAGNOSIS — I87333 Chronic venous hypertension (idiopathic) with ulcer and inflammation of bilateral lower extremity: Secondary | ICD-10-CM | POA: Diagnosis not present

## 2019-06-06 DIAGNOSIS — L97212 Non-pressure chronic ulcer of right calf with fat layer exposed: Secondary | ICD-10-CM | POA: Diagnosis not present

## 2019-06-06 DIAGNOSIS — L97821 Non-pressure chronic ulcer of other part of left lower leg limited to breakdown of skin: Secondary | ICD-10-CM | POA: Diagnosis not present

## 2019-06-06 DIAGNOSIS — I890 Lymphedema, not elsewhere classified: Secondary | ICD-10-CM | POA: Diagnosis not present

## 2019-06-10 DIAGNOSIS — I87333 Chronic venous hypertension (idiopathic) with ulcer and inflammation of bilateral lower extremity: Secondary | ICD-10-CM | POA: Diagnosis not present

## 2019-06-10 DIAGNOSIS — I509 Heart failure, unspecified: Secondary | ICD-10-CM | POA: Diagnosis not present

## 2019-06-10 DIAGNOSIS — L97212 Non-pressure chronic ulcer of right calf with fat layer exposed: Secondary | ICD-10-CM | POA: Diagnosis not present

## 2019-06-10 DIAGNOSIS — L97821 Non-pressure chronic ulcer of other part of left lower leg limited to breakdown of skin: Secondary | ICD-10-CM | POA: Diagnosis not present

## 2019-06-10 DIAGNOSIS — I890 Lymphedema, not elsewhere classified: Secondary | ICD-10-CM | POA: Diagnosis not present

## 2019-06-13 ENCOUNTER — Encounter (HOSPITAL_BASED_OUTPATIENT_CLINIC_OR_DEPARTMENT_OTHER): Payer: Medicare Other | Attending: Internal Medicine

## 2019-06-13 DIAGNOSIS — I10 Essential (primary) hypertension: Secondary | ICD-10-CM | POA: Insufficient documentation

## 2019-06-13 DIAGNOSIS — I87333 Chronic venous hypertension (idiopathic) with ulcer and inflammation of bilateral lower extremity: Secondary | ICD-10-CM | POA: Diagnosis not present

## 2019-06-13 DIAGNOSIS — I890 Lymphedema, not elsewhere classified: Secondary | ICD-10-CM | POA: Insufficient documentation

## 2019-06-13 DIAGNOSIS — L97812 Non-pressure chronic ulcer of other part of right lower leg with fat layer exposed: Secondary | ICD-10-CM | POA: Insufficient documentation

## 2019-06-13 DIAGNOSIS — G4733 Obstructive sleep apnea (adult) (pediatric): Secondary | ICD-10-CM | POA: Diagnosis not present

## 2019-06-13 DIAGNOSIS — L97822 Non-pressure chronic ulcer of other part of left lower leg with fat layer exposed: Secondary | ICD-10-CM | POA: Insufficient documentation

## 2019-06-17 DIAGNOSIS — I509 Heart failure, unspecified: Secondary | ICD-10-CM | POA: Diagnosis not present

## 2019-06-17 DIAGNOSIS — L97821 Non-pressure chronic ulcer of other part of left lower leg limited to breakdown of skin: Secondary | ICD-10-CM | POA: Diagnosis not present

## 2019-06-17 DIAGNOSIS — I87333 Chronic venous hypertension (idiopathic) with ulcer and inflammation of bilateral lower extremity: Secondary | ICD-10-CM | POA: Diagnosis not present

## 2019-06-17 DIAGNOSIS — L97212 Non-pressure chronic ulcer of right calf with fat layer exposed: Secondary | ICD-10-CM | POA: Diagnosis not present

## 2019-06-17 DIAGNOSIS — I890 Lymphedema, not elsewhere classified: Secondary | ICD-10-CM | POA: Diagnosis not present

## 2019-06-20 DIAGNOSIS — L97212 Non-pressure chronic ulcer of right calf with fat layer exposed: Secondary | ICD-10-CM | POA: Diagnosis not present

## 2019-06-20 DIAGNOSIS — I890 Lymphedema, not elsewhere classified: Secondary | ICD-10-CM | POA: Diagnosis not present

## 2019-06-20 DIAGNOSIS — L97821 Non-pressure chronic ulcer of other part of left lower leg limited to breakdown of skin: Secondary | ICD-10-CM | POA: Diagnosis not present

## 2019-06-20 DIAGNOSIS — I87333 Chronic venous hypertension (idiopathic) with ulcer and inflammation of bilateral lower extremity: Secondary | ICD-10-CM | POA: Diagnosis not present

## 2019-06-20 DIAGNOSIS — I509 Heart failure, unspecified: Secondary | ICD-10-CM | POA: Diagnosis not present

## 2019-06-24 DIAGNOSIS — I509 Heart failure, unspecified: Secondary | ICD-10-CM | POA: Diagnosis not present

## 2019-06-24 DIAGNOSIS — I890 Lymphedema, not elsewhere classified: Secondary | ICD-10-CM | POA: Diagnosis not present

## 2019-06-24 DIAGNOSIS — I87333 Chronic venous hypertension (idiopathic) with ulcer and inflammation of bilateral lower extremity: Secondary | ICD-10-CM | POA: Diagnosis not present

## 2019-06-24 DIAGNOSIS — L97821 Non-pressure chronic ulcer of other part of left lower leg limited to breakdown of skin: Secondary | ICD-10-CM | POA: Diagnosis not present

## 2019-06-24 DIAGNOSIS — L97212 Non-pressure chronic ulcer of right calf with fat layer exposed: Secondary | ICD-10-CM | POA: Diagnosis not present

## 2019-06-27 ENCOUNTER — Other Ambulatory Visit: Payer: Self-pay

## 2019-06-27 ENCOUNTER — Encounter (HOSPITAL_BASED_OUTPATIENT_CLINIC_OR_DEPARTMENT_OTHER): Payer: Medicare Other | Attending: Internal Medicine | Admitting: Internal Medicine

## 2019-06-27 DIAGNOSIS — L97822 Non-pressure chronic ulcer of other part of left lower leg with fat layer exposed: Secondary | ICD-10-CM | POA: Insufficient documentation

## 2019-06-27 DIAGNOSIS — I10 Essential (primary) hypertension: Secondary | ICD-10-CM | POA: Insufficient documentation

## 2019-06-27 DIAGNOSIS — L97812 Non-pressure chronic ulcer of other part of right lower leg with fat layer exposed: Secondary | ICD-10-CM | POA: Diagnosis not present

## 2019-06-27 DIAGNOSIS — G4730 Sleep apnea, unspecified: Secondary | ICD-10-CM | POA: Diagnosis not present

## 2019-06-27 DIAGNOSIS — L97521 Non-pressure chronic ulcer of other part of left foot limited to breakdown of skin: Secondary | ICD-10-CM | POA: Insufficient documentation

## 2019-06-27 DIAGNOSIS — L97529 Non-pressure chronic ulcer of other part of left foot with unspecified severity: Secondary | ICD-10-CM | POA: Diagnosis not present

## 2019-06-27 DIAGNOSIS — I87312 Chronic venous hypertension (idiopathic) with ulcer of left lower extremity: Secondary | ICD-10-CM | POA: Diagnosis not present

## 2019-06-27 DIAGNOSIS — I87333 Chronic venous hypertension (idiopathic) with ulcer and inflammation of bilateral lower extremity: Secondary | ICD-10-CM | POA: Insufficient documentation

## 2019-06-27 DIAGNOSIS — I890 Lymphedema, not elsewhere classified: Secondary | ICD-10-CM | POA: Insufficient documentation

## 2019-07-01 DIAGNOSIS — L97821 Non-pressure chronic ulcer of other part of left lower leg limited to breakdown of skin: Secondary | ICD-10-CM | POA: Diagnosis not present

## 2019-07-01 DIAGNOSIS — L97212 Non-pressure chronic ulcer of right calf with fat layer exposed: Secondary | ICD-10-CM | POA: Diagnosis not present

## 2019-07-01 DIAGNOSIS — I509 Heart failure, unspecified: Secondary | ICD-10-CM | POA: Diagnosis not present

## 2019-07-01 DIAGNOSIS — I890 Lymphedema, not elsewhere classified: Secondary | ICD-10-CM | POA: Diagnosis not present

## 2019-07-01 DIAGNOSIS — I87333 Chronic venous hypertension (idiopathic) with ulcer and inflammation of bilateral lower extremity: Secondary | ICD-10-CM | POA: Diagnosis not present

## 2019-07-01 NOTE — Progress Notes (Signed)
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 Notes: macerated entire leg. Wound #62 status is Open. Original cause of wound was Gradually Appeared. The wound is located on the Right,Circumferential Lower Leg. The wound measures 22cm length x 52cm width x 0.1cm depth; 898.495cm^2 area and 89.85cm^3 volume. There is Fat Layer  (Subcutaneous Tissue) Exposed exposed. There is no tunneling or undermining noted. There is a large amount of purulent drainage noted. Foul odor after cleansing was noted. The wound margin is flat and intact. There is large (67-100%) red granulation within the wound bed. There is a small (1- 33%) amount of necrotic tissue within the wound bed including Adherent Slough. General Notes: macerated leg. Wound #63 status is Open. Original cause of wound was Gradually Appeared. The wound is located on the Left,Dorsal Foot. The wound measures 0.5cm length x 0.5cm width x 0.1cm depth; 0.196cm^2 area and 0.02cm^3 volume. There is no tunneling or undermining noted. There is a large amount of purulent drainage noted. Foul odor after cleansing was noted. The wound margin is distinct with the outline attached to the wound base. There is large (67-100%) red granulation within the wound bed. There is no necrotic tissue within the wound bed. General Notes: macerated area. 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 There was a Four Layer Compression Therapy Procedure by Levan Hurst, RN. Post procedure Diagnosis Wound #61: Same as Pre-Procedure Wound #62 Pre-procedure diagnosis of Wound #62 is a Lymphedema located on the Right,Circumferential Lower Leg . There was a Four Layer Compression Therapy Procedure by Levan Hurst, RN.  Post procedure Diagnosis Wound #62: Same as Pre-Procedure Wound #63 Pre-procedure diagnosis of Wound #63 is a Lymphedema located on the Left,Dorsal  Foot . There was a Four Layer Compression Therapy Procedure by Levan Hurst, RN. Post procedure Diagnosis Wound #63: Same as Pre-Procedure Plan Follow-up Appointments: Return Appointment in 2 weeks. Dressing Change Frequency: Wound #61 Left,Circumferential Lower Leg: Change dressing three times week. - Friday at wound center, Mon and Tues by home health Wound #62 Right,Circumferential Lower Leg: Change dressing three times week. - Friday at wound center, Mon and Tues by home health Wound #63 Left,Dorsal Foot: Change dressing three times week. - Friday at wound center, Mon and Tues by home health Skin Barriers/Peri-Wound Care: Antifungal cream - mixed with zinc oxide cream and triamcinolone cream to excoriated skin Barrier cream TCA Cream or Ointment Wound Cleansing: May shower with protection. Primary Wound Dressing: Wound #61 Left,Circumferential Lower Leg: Calcium Alginate with Silver Wound #62 Right,Circumferential Lower Leg: Calcium Alginate with Silver Wound #63 Left,Dorsal Foot: Calcium Alginate with Silver Secondary Dressing: Wound #61 Left,Circumferential Lower Leg: ABD pad Kerramax - or zetuvit or equivalent super absorbant pad Wound #62 Right,Circumferential Lower Leg: ABD pad Kerramax - or zetuvit or equivalent super absorbant pad Wound #63 Left,Dorsal Foot: ABD pad Kerramax - or zetuvit or equivalent super absorbant pad Edema Control: 4 layer compression - Bilateral Avoid standing for long periods of time Elevate legs to the level of the heart or above for 30 minutes daily and/or when sitting, a frequency of: - throughout the day Exercise regularly Segmental Compressive Device. - lymphadema pumps 60 minutes 2 times per day Home Health: [facility] for wound care. - [facility] to continue wound care The following medication(s) was prescribed: Cipro oral 500 mg tablet 1 tablet oral bid for 7 days for cellulitis right L/E starting  06/27/2019 1. No change to the actual dressings from last week 2. The green drainage was a bit difficult to explain. He did not have obvious cellulitis at least no tenderness. There is a lot of erythema although he has chronic stasis dermatitis. 3. Electronic Signature(s) Signed: 06/29/2019 10:27:00 AM By: Linton Ham MD Entered By: Linton Ham on 06/27/2019 14:01:36 -------------------------------------------------------------------------------- SuperBill Details Patient Name: Date of Service: JAMIKAL, HOMMES 06/27/2019 Medical Record [id number] Patient Account Number: [id number] Date of Birth/Sex: Treating RN: [birth year] (63 y.o. 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 Modifier Quantity: 1 Physician Procedures CPT4: Code [id number] Description: [id number] - WC PHYS LEVEL 3 - EST PT ICD-10 Diagnosis Description L97.811 Non-pressure chronic ulcer of other part of right lower le skin I87.333 Chronic venous hypertension (idiopathic) with ulcer and in lower extremity L97.121 Non-pressure  chronic ulcer of left thigh limited to breakd Modifier: g limited to brea flammation of bil own of skin Quantity: 1 kdown of ateral Electronic Signature(s) Signed: 06/29/2019 10:27:00 AM By:  Linton Ham MD Signed: 07/01/2019 5:52:09 PM By: Levan Hurst RN, BSN Entered By: Levan Hurst  on 06/27/2019 16:27:43

## 2019-07-01 NOTE — Progress Notes (Signed)
[name] [name], [name] [name] ([id number]) Visit Report for 06/27/2019 Arrival Information Details Patient Name: Date of Service: [name] [name], [name] [name] 06/27/2019 1:00 PM Medical Record [id number] Patient Account Number: [id number] Date of Birth/Sex: Treating RN: 1956-06-07 (63 y.o. Lorette Ang, Meta.Reding Primary Care Clarance Bollard: [name] Other Clinician: Referring Orey Moure: Treating Aubri Gathright/Extender:Robson, Donnamae Jude, August Albino in Treatment: 2 Visit Information History Since Last Visit Cane Added or deleted any No Patient Arrived: 12:50 medications: Arrival Time: Any new allergies or adverse No Accompanied By: self None reactions: Transfer Assistance: Had a fall or experienced change No Patient Identification Verified: Yes in Secondary Verification Process Completed: Yes activities of daily living that may Patient Requires Transmission-Based No affect Precautions: risk of falls: Patient Has Alerts: No Signs or symptoms of No abuse/neglect since last visito Hospitalized since last visit: No Implantable device outside of the No clinic excluding cellular tissue based products placed in the center since last visit: Has Dressing in Place as Yes Prescribed: Has Compression in Place as Yes Prescribed: Has Footwear/Offloading in Place Yes as Prescribed: Left: Surgical Shoe with Pressure Relief Insole Right: Surgical Shoe with Pressure Relief Insole Pain Present Now: No Electronic Signature(s) Signed: 06/27/2019 6:01:54 PM By: Deon Pilling Entered By: Deon Pilling on 06/27/2019 12:50:52 -------------------------------------------------------------------------------- Compression Therapy Details Patient Name: Date of Service: [name] [name], [name] [name] 06/27/2019 1:00 PM Medical Record [id number] Patient Account Number: [id number] Date of Birth/Sex: Treating RN: 1956-05-31 (63 y.o. [name] Primary Care Miara Emminger: [name] Other Clinician: Referring Doron Shake:  Treating Nadene Witherspoon/Extender:Robson, Donnamae Jude, August Albino in Treatment: 2 Compression Therapy Performed for Wound Wound #61 Left,Circumferential Lower Leg Assessment: Performed By: Clinician Levan Hurst, RN Compression Type: Four Layer Post Procedure Diagnosis Same as Pre-procedure Electronic Signature(s) Signed: 07/01/2019 5:52:09 PM By: Levan Hurst RN, BSN Entered By: Levan Hurst on 06/27/2019 13:47:42 -------------------------------------------------------------------------------- Compression Therapy Details Patient Name: Date of Service: [name] Igo. 06/27/2019 1:00 PM Medical Record [id number] Patient Account Number: [id number] Date of Birth/Sex: Treating RN: May 22, 1956 (63 y.o. [name] Primary Care Bernice Mullin: [name] Other Clinician: Referring Caeson Filippi: Treating Garett Tetzloff/Extender:Robson, Donnamae Jude, August Albino in Treatment: 2 Compression Therapy Performed for Wound Wound #62 Right,Circumferential Lower Leg Assessment: Performed By: Clinician Levan Hurst, RN Compression Type: Four Layer Post Procedure Diagnosis Same as Pre-procedure Electronic Signature(s) Signed: 07/01/2019 5:52:09 PM By: Levan Hurst RN, BSN Entered By: Levan Hurst on 06/27/2019 13:47:42 -------------------------------------------------------------------------------- Compression Therapy Details Patient Name: Date of Service: [name] Igo. 06/27/2019 1:00 PM Medical Record [id number] Patient Account Number: [id number] Date of Birth/Sex: Treating RN: July 04, 1956 (63 y.o. [name] Primary Care Ronold Hardgrove: [name] Other Clinician: Referring Daiwik Buffalo: Treating Luciano Cinquemani/Extender:Robson, Donnamae Jude, August Albino in Treatment: 2 Compression Therapy Performed for Wound Wound #63 Left,Dorsal Foot Assessment: Performed By: Clinician Levan Hurst, RN Compression Type: Four Layer Post Procedure Diagnosis Same as  Pre-procedure Electronic Signature(s) Signed: 07/01/2019 5:52:09 PM By: Levan Hurst RN, BSN Entered By: Levan Hurst on 06/27/2019 13:47:42 -------------------------------------------------------------------------------- Encounter Discharge Information Details Patient Name: Date of Service: [name] Igo. 06/27/2019 1:00 PM Medical Record [id number] Patient Account Number: [id number] Date of Birth/Sex: Treating RN: 09-Jun-1956 (63 y.o. Hessie Diener Primary Care Teyah Rossy: [name] Other Clinician: Referring Lydie Stammen: Treating Akia Desroches/Extender:Robson, Donnamae Jude, August Albino in Treatment: 2 Encounter Discharge Information Items Discharge Condition: Stable Ambulatory Status: Cane Discharge Destination: Home Transportation: Private Auto Accompanied By: self Schedule Follow-up Appointment: Yes Clinical Summary of Care: Notes recheck BP 171/87 Electronic Signature(s) Signed: 06/27/2019 6:01:54 PM By: Deon Pilling Entered By: Deon Pilling on 06/27/2019  [id number] -------------------------------------------------------------------------------- Lower Extremity Assessment Details Patient Name: Date of Service: [name] [name], [name] [name] 06/27/2019 1:00 PM Medical Record [id number] Patient Account Number: [id number] Date of Birth/Sex: Treating RN: 1956-06-22 (63 y.o. Hessie Diener Primary Care Analy Bassford: [name] Other Clinician: Referring Izac Faulkenberry: Treating Raunak Antuna/Extender:Robson, Donnamae Jude, August Albino in Treatment: 2 Edema Assessment Assessed: [Left: No] [Right: No] Edema: [Left: Yes] [Right: Yes] Calf Left: Right: Point of Measurement: 38 cm From Medial Instep 42.5 cm 53 cm Ankle Left: Right: Point of Measurement: 10 cm From Medial Instep 26 cm 32 cm Electronic Signature(s) Signed: 06/27/2019 6:01:54 PM By: Deon Pilling Entered By: Deon Pilling on 06/27/2019  12:57:51 -------------------------------------------------------------------------------- [place] Details Patient Name: Date of Service: [name] Igo. 06/27/2019 1:00 PM Medical Record [id number] Patient Account Number: [id number] Date of Birth/Sex: Treating RN: Mar 24, 1956 (62 y.o. [name] Primary Care Fani Rotondo: [name] Other Clinician: Referring Aaryanna Hyden: Treating Apolonio Cutting/Extender:Robson, Donnamae Jude, August Albino in Treatment: 2 Active Inactive Abuse / Safety / Falls / Self Care Management Nursing Diagnoses: History of Falls Potential for falls Goals: Patient/caregiver will verbalize/demonstrate measures taken to prevent injury and/or falls Date Initiated: 06/13/2019 Target Resolution Date: 07/11/2019 Goal Status: Active Interventions: Assess fall risk on admission and as needed Assess impairment of mobility on admission and as needed per policy Notes: Venous Leg Ulcer Nursing Diagnoses: Actual venous Insuffiency (use after diagnosis is confirmed) Knowledge deficit related to disease process and management Goals: Patient will maintain optimal edema control Date Initiated: 06/13/2019 Target Resolution Date: 07/11/2019 Goal Status: Active Interventions: Compression as ordered Provide education on venous insufficiency Treatment Activities: Therapeutic compression applied : 06/13/2019 Notes: Wound/Skin Impairment Nursing Diagnoses: Impaired tissue integrity Knowledge deficit related to ulceration/compromised skin integrity Goals: Patient/caregiver will verbalize understanding of skin care regimen Date Initiated: 06/13/2019 Target Resolution Date: 07/11/2019 Goal Status: Active Ulcer/skin breakdown will have a volume reduction of 30% by week 4 Date Initiated: 06/13/2019 Target Resolution Date: 07/11/2019 Goal Status: Active Interventions: Assess patient/caregiver ability to obtain necessary supplies Assess  patient/caregiver ability to perform ulcer/skin care regimen upon admission and as needed Assess ulceration(s) every visit Treatment Activities: Skin care regimen initiated : 06/13/2019 Topical wound management initiated : 06/13/2019 Notes: Electronic Signature(s) Signed: 07/01/2019 5:52:09 PM By: Levan Hurst RN, BSN Entered By: Levan Hurst on 06/27/2019 13:43:13 -------------------------------------------------------------------------------- Pain Assessment Details Patient Name: Date of Service: [name] Igo. 06/27/2019 1:00 PM Medical Record [id number] Patient Account Number: [id number] Date of Birth/Sex: Treating RN: Dec 23, 1955 (63 y.o. Hessie Diener Primary Care Jeancarlos Marchena: [name] Other Clinician: Referring Atilano Covelli: Treating Charlynn Salih/Extender:Robson, Donnamae Jude, August Albino in Treatment: 2 Active Problems Location of Pain Severity and Description of Pain Patient Has Paino No Site Locations Rate the pain. Current Pain Level: 0 Pain Management and Medication Current Pain Management: Medication: No Cold Application: No Rest: No Massage: No Activity: No T.E.N.S.: No Heat Application: No Leg drop or elevation: No Is the Current Pain Management Adequate: Adequate How does your wound impact your activities of daily livingo Sleep: No Bathing: No Appetite: No Relationship With Others: No Bladder Continence: No Emotions: No Bowel Continence: No Work: No Toileting: No Drive: No Dressing: No Hobbies: No Electronic Signature(s) Signed: 06/27/2019 6:01:54 PM By: Deon Pilling Entered By: Deon Pilling on 06/27/2019 12:51:30 -------------------------------------------------------------------------------- Patient/Caregiver Education Details Patient Name: Date of Service: [name] Igo 10/9/2020andnbsp1:00 PM Medical Record [id number] Patient Account Number: [id number] Date of Birth/Gender: Treating RN: [birth year] (63 y.o. [name] Primary Care Physician: [name] Other Clinician: Referring Physician: Treating  Physician/Extender:Robson, Donnamae Jude, August Albino in Treatment: 2 Education Assessment Education Provided To: Patient Education Topics Provided Wound/Skin Impairment: Methods: Explain/Verbal Responses: State content correctly Electronic Signature(s) Signed: 07/01/2019 5:52:09 PM By: Levan Hurst RN, BSN Entered By: Levan Hurst on 06/27/2019 13:43:27 -------------------------------------------------------------------------------- Wound Assessment Details Patient Name: Date of Service: [name] Igo. 06/27/2019 1:00 PM Medical Record [id number] Patient Account Number: [id number] Date of Birth/Sex: Treating RN: 1956/06/14 (63 y.o. Hessie Diener Primary Care Baili Stang: [name] Other Clinician: Referring Starlet Gallentine: Treating Keirstan Iannello/Extender:Robson, Donnamae Jude, August Albino in Treatment: 2 Wound Status Wound Number: 61 Primary Etiology: Lymphedema Wound Location: Left Lower Leg - Circumferential Wound Status: Open Wounding Event: Gradually Appeared Comorbid History: Sleep Apnea, Hypertension Date Acquired: 04/19/2019 Weeks Of Treatment: 2 Clustered Wound: No Photos Wound Measurements Length: (cm) 19 % Reduct Width: (cm) 36 % Reduct Depth: (cm) 0.1 Epitheli Area: (cm) 537.212 Tunneli Volume: (cm) 53.721 Undermi Wound Description Full Thickness Without Exposed Support Foul Odo Classification: Structures Due to P Wound Slough/F Flat and Intact Margin: Exudate Large Amount: Exudate Purulent Type: Exudate yellow, brown, green Color: Wound Bed Granulation Amount: Large (67-100%) Granulation Quality: Red Fascia E Necrotic Amount: None Present (0%) Fat Laye Tendon E Muscle E Joint Ex Bone Exp r After Cleansing: Yes roduct Use: No ibrino No Exposed Structure xposed: No r (Subcutaneous Tissue) Exposed: Yes xposed: No xposed: No posed:  No osed: No ion in Area: 29.3% ion in Volume: 29.3% alization: None ng: No ning: No Assessment Notes macerated entire leg. Treatment Notes Wound #61 (Left, Circumferential Lower Leg) 1. Cleanse With Wound Cleanser Soap and water 2. Periwound Care Antifungal cream Barrier cream TCA Cream 3. Primary Dressing Applied Calcium Alginate Ag 4. Secondary Dressing ABD Pad Dry Gauze Kerramax/Xtrasorb 6. Support Layer Applied 4 layer compression wrap Notes stockinette. Electronic Signature(s) Signed: 06/30/2019 3:43:33 PM By: Mikeal Hawthorne EMT/HBOT Signed: 06/30/2019 6:01:15 PM By: Deon Pilling Previous Signature: 06/27/2019 6:01:54 PM Version By: Deon Pilling Entered By: Mikeal Hawthorne on 06/30/2019 09:35:46 -------------------------------------------------------------------------------- Wound Assessment Details Patient Name: Date of Service: [name] Igo. 06/27/2019 1:00 PM Medical Record [id number] Patient Account Number: [id number] Date of Birth/Sex: Treating RN: Jan 27, 1956 (63 y.o. Hessie Diener Primary Care Venesa Semidey: [name] Other Clinician: Referring Laqueisha Catalina: Treating Samya Siciliano/Extender:Robson, Donnamae Jude, August Albino in Treatment: 2 Wound Status Wound Number: 62 Primary Etiology: Lymphedema Wound Location: Right Lower Leg - Circumferential Wound Status: Open Wounding Event: Gradually Appeared Comorbid History: Sleep Apnea, Hypertension Date Acquired: 04/19/2019 Weeks Of Treatment: 2 Clustered Wound: No Photos Wound Measurements Length: (cm) 22 % Reduct Width: (cm) 52 % Reduct Depth: (cm) 0.1 Epitheli Area: (cm) 898.495 Tunneli Volume: (cm) 89.85 Undermi Wound Description Classification: Full Thickness Without Exposed Support Structures Wound Flat and Intact Margin: Exudate Large Amount: Exudate Purulent Type: Exudate yellow, brown, green Color: Wound Bed Granulation Amount: Large (67-100%) Granulation Quality:  Red Necrotic Amount: Small (1-33%) Necrotic Quality: Adherent Slough Assessment Notes macerated leg. Treatment Notes Wound #62 (Right, Circumferential Lower Leg) 1. Cleanse With Wound Cleanser Soap and water 2. Periwound Care Antifungal cream Barrier cream TCA Cream 3. Primary Dressing Applied Calcium Alginate Ag 4. Secondary Dressing ABD Pad Dry Gauze Kerramax/Xtrasorb 6. Support Layer Applied 4 layer compression wrap Foul Odor After Cleansing: Yes Due to Product Use: No Slough/Fibrino No Exposed Structure Fascia Exposed: No Fat Layer (Subcutaneous Tissue) Exposed: Yes Tendon Exposed: No Muscle Exposed: No Joint Exposed: No Bone Exposed: No ion in Area: -8.1% ion in Volume: -8.1% alization: None ng: No ning: No Notes  stockinette. Electronic Signature(s) Signed: 06/30/2019 3:43:33 PM By: Mikeal Hawthorne EMT/HBOT Signed: 06/30/2019 6:01:15 PM By: Deon Pilling Previous Signature: 06/27/2019 6:01:54 PM Version By: Deon Pilling Entered By: Mikeal Hawthorne on 06/30/2019 09:35:15 -------------------------------------------------------------------------------- Wound Assessment Details Patient Name: Date of Service: [name] Igo. 06/27/2019 1:00 PM Medical Record [id number] Patient Account Number: [id number] Date of Birth/Sex: Treating RN: 03/16/1956 (63 y.o. Hessie Diener Primary Care Jamielyn Petrucci: [name] Other Clinician: Referring Aeneas Longsworth: Treating Khylee Algeo/Extender:Robson, Donnamae Jude, August Albino in Treatment: 2 Wound Status Wound Number: 63 Primary Etiology: Lymphedema Wound Location: Left Foot - Dorsal Wound Status: Open Wounding Event: Gradually Appeared Comorbid History: Sleep Apnea, Hypertension Date Acquired: 06/23/2019 Weeks Of Treatment: 0 Clustered Wound: No Photos Wound Measurements Length: (cm) 0.5 % Reduct Width: (cm) 0.5 % Reduct Depth: (cm) 0.1 Epitheli Area: (cm) 0.196 Tunneli Volume: (cm) 0.02 Undermi Wound  Description Classification: Full Thickness Without Exposed Support Foul Odo Structures Due to P Wound Slough/F Distinct, outline attached Margin: Exudate Large Amount: Exudate Purulent Type: Exudate yellow, brown, green Color: Wound Bed Granulation Amount: Large (67-100%) Granulation Quality: Red Fascia E Necrotic Amount: None Present (0%) Fat Laye Tendon E Muscle E Joint Ex Bone Exp r After Cleansing: Yes roduct Use: No ibrino No Exposed Structure xposed: No r (Subcutaneous Tissue) Exposed: No xposed: No xposed: No posed: No osed: No ion in Area: 0% ion in Volume: 0% alization: Small (1-33%) ng: No ning: No Assessment Notes macerated area. Treatment Notes Wound #63 (Left, Dorsal Foot) 1. Cleanse With Wound Cleanser Soap and water 2. Periwound Care Antifungal cream Barrier cream TCA Cream 3. Primary Dressing Applied Calcium Alginate Ag 4. Secondary Dressing ABD Pad Dry Gauze Kerramax/Xtrasorb 6. Support Layer Applied 4 layer compression wrap Notes stockinette. Electronic Signature(s) Signed: 06/30/2019 3:43:33 PM By: Mikeal Hawthorne EMT/HBOT Signed: 06/30/2019 6:01:15 PM By: Deon Pilling Previous Signature: 06/27/2019 6:01:54 PM Version By: Deon Pilling Entered By: Mikeal Hawthorne on 06/30/2019 09:39:11 -------------------------------------------------------------------------------- Vitals Details Patient Name: Date of Service: [name] Igo. 06/27/2019 1:00 PM Medical Record [id number] Patient Account Number: [id number] Date of Birth/Sex: Treating RN: May 01, 1956 (63 y.o. Hessie Diener Primary Care Brandilyn Nanninga: [name] Other Clinician: Referring Marvell Tamer: Treating Ema Hebner/Extender:Robson, Donnamae Jude, August Albino in Treatment: 2 Vital Signs Time Taken: 13:00 Temperature (F): 98.1 Height (in): 70 Pulse (bpm): 64 Weight (lbs): 350 Respiratory Rate (breaths/min): 24 Body Mass Index (BMI): 50.2 Blood Pressure (mmHg):  184/89 Reference Range: 80 - 120 mg / dl Electronic Signature(s) Signed: 06/27/2019 6:01:54 PM By: Deon Pilling Entered By: Deon Pilling on 06/27/2019 13:08:41

## 2019-07-04 DIAGNOSIS — L97821 Non-pressure chronic ulcer of other part of left lower leg limited to breakdown of skin: Secondary | ICD-10-CM | POA: Diagnosis not present

## 2019-07-04 DIAGNOSIS — I890 Lymphedema, not elsewhere classified: Secondary | ICD-10-CM | POA: Diagnosis not present

## 2019-07-04 DIAGNOSIS — L97212 Non-pressure chronic ulcer of right calf with fat layer exposed: Secondary | ICD-10-CM | POA: Diagnosis not present

## 2019-07-04 DIAGNOSIS — I87333 Chronic venous hypertension (idiopathic) with ulcer and inflammation of bilateral lower extremity: Secondary | ICD-10-CM | POA: Diagnosis not present

## 2019-07-04 DIAGNOSIS — I509 Heart failure, unspecified: Secondary | ICD-10-CM | POA: Diagnosis not present

## 2019-07-05 DIAGNOSIS — G4730 Sleep apnea, unspecified: Secondary | ICD-10-CM | POA: Diagnosis not present

## 2019-07-05 DIAGNOSIS — I890 Lymphedema, not elsewhere classified: Secondary | ICD-10-CM | POA: Diagnosis not present

## 2019-07-05 DIAGNOSIS — L97219 Non-pressure chronic ulcer of right calf with unspecified severity: Secondary | ICD-10-CM | POA: Diagnosis not present

## 2019-07-05 DIAGNOSIS — I87333 Chronic venous hypertension (idiopathic) with ulcer and inflammation of bilateral lower extremity: Secondary | ICD-10-CM | POA: Diagnosis not present

## 2019-07-05 DIAGNOSIS — L97829 Non-pressure chronic ulcer of other part of left lower leg with unspecified severity: Secondary | ICD-10-CM | POA: Diagnosis not present

## 2019-07-05 DIAGNOSIS — I509 Heart failure, unspecified: Secondary | ICD-10-CM | POA: Diagnosis not present

## 2019-07-07 DIAGNOSIS — Z23 Encounter for immunization: Secondary | ICD-10-CM | POA: Diagnosis not present

## 2019-07-07 DIAGNOSIS — L97219 Non-pressure chronic ulcer of right calf with unspecified severity: Secondary | ICD-10-CM | POA: Diagnosis not present

## 2019-07-07 DIAGNOSIS — R609 Edema, unspecified: Secondary | ICD-10-CM | POA: Diagnosis not present

## 2019-07-07 DIAGNOSIS — L0390 Cellulitis, unspecified: Secondary | ICD-10-CM | POA: Diagnosis not present

## 2019-07-07 DIAGNOSIS — L97829 Non-pressure chronic ulcer of other part of left lower leg with unspecified severity: Secondary | ICD-10-CM | POA: Diagnosis not present

## 2019-07-07 DIAGNOSIS — I87333 Chronic venous hypertension (idiopathic) with ulcer and inflammation of bilateral lower extremity: Secondary | ICD-10-CM | POA: Diagnosis not present

## 2019-07-07 DIAGNOSIS — I509 Heart failure, unspecified: Secondary | ICD-10-CM | POA: Diagnosis not present

## 2019-07-07 DIAGNOSIS — I890 Lymphedema, not elsewhere classified: Secondary | ICD-10-CM | POA: Diagnosis not present

## 2019-07-07 DIAGNOSIS — Z6841 Body Mass Index (BMI) 40.0 and over, adult: Secondary | ICD-10-CM | POA: Diagnosis not present

## 2019-07-09 DIAGNOSIS — I87333 Chronic venous hypertension (idiopathic) with ulcer and inflammation of bilateral lower extremity: Secondary | ICD-10-CM | POA: Diagnosis not present

## 2019-07-09 DIAGNOSIS — L97219 Non-pressure chronic ulcer of right calf with unspecified severity: Secondary | ICD-10-CM | POA: Diagnosis not present

## 2019-07-09 DIAGNOSIS — I890 Lymphedema, not elsewhere classified: Secondary | ICD-10-CM | POA: Diagnosis not present

## 2019-07-09 DIAGNOSIS — L97829 Non-pressure chronic ulcer of other part of left lower leg with unspecified severity: Secondary | ICD-10-CM | POA: Diagnosis not present

## 2019-07-09 DIAGNOSIS — I509 Heart failure, unspecified: Secondary | ICD-10-CM | POA: Diagnosis not present

## 2019-07-11 ENCOUNTER — Encounter (HOSPITAL_BASED_OUTPATIENT_CLINIC_OR_DEPARTMENT_OTHER): Payer: Medicare Other | Admitting: Internal Medicine

## 2019-07-11 ENCOUNTER — Other Ambulatory Visit: Payer: Self-pay

## 2019-07-11 DIAGNOSIS — L97822 Non-pressure chronic ulcer of other part of left lower leg with fat layer exposed: Secondary | ICD-10-CM | POA: Diagnosis not present

## 2019-07-11 DIAGNOSIS — I87333 Chronic venous hypertension (idiopathic) with ulcer and inflammation of bilateral lower extremity: Secondary | ICD-10-CM | POA: Diagnosis not present

## 2019-07-11 DIAGNOSIS — L97529 Non-pressure chronic ulcer of other part of left foot with unspecified severity: Secondary | ICD-10-CM | POA: Diagnosis not present

## 2019-07-11 DIAGNOSIS — I10 Essential (primary) hypertension: Secondary | ICD-10-CM | POA: Diagnosis not present

## 2019-07-11 DIAGNOSIS — I890 Lymphedema, not elsewhere classified: Secondary | ICD-10-CM | POA: Diagnosis not present

## 2019-07-11 DIAGNOSIS — L97521 Non-pressure chronic ulcer of other part of left foot limited to breakdown of skin: Secondary | ICD-10-CM | POA: Diagnosis not present

## 2019-07-11 DIAGNOSIS — L97812 Non-pressure chronic ulcer of other part of right lower leg with fat layer exposed: Secondary | ICD-10-CM | POA: Diagnosis not present

## 2019-07-11 NOTE — Progress Notes (Signed)
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 Conjunctivae clear. No discharge.no icterus. Respiratory work of breathing is normal. Cardiovascular Pedal pulses palpable and strong bilaterally.[facility] appears at normal baseline. General Notes: Wound exam; there is some improvement in the overall epithelialization on the sides of both calfs  most prominent in the right  lateral calf. He has chronic stasis dermatitis although this seems somewhat better. There is new open areas on the dorsal foot Integumentary (Hair, Skin) No evidence of infection around the wounds. Wound #61 status is Open. Original cause of wound was Gradually Appeared. The wound is located on the Left,Circumferential Lower Leg. The wound measures 9.5cm length x 8cm width x 0.1cm depth; 59.69cm^2 area and 5.969cm^3 volume. There is Fat Layer (Subcutaneous Tissue) Exposed exposed. There is no tunneling or undermining noted. There is a medium amount of serosanguineous drainage noted. Foul odor after cleansing was noted. The wound margin is flat and intact. There is large (67-100%) red granulation within the wound bed. There is no necrotic tissue within the wound bed. Wound #62 status is Open. Original cause of wound was Gradually Appeared. The wound is located on the Right,Circumferential Lower Leg. The wound measures 9.5cm length x 8.2cm width x 0.1cm depth; 61.183cm^2 area and 6.118cm^3 volume. There is Fat Layer (Subcutaneous Tissue) Exposed exposed. There is no tunneling or undermining noted. There is a large amount of serosanguineous drainage noted. The wound margin is flat and intact. There is large (67-100%) red granulation within the wound bed. There is no necrotic tissue within the wound bed. Wound #63 status is Open. Original cause of wound was Gradually Appeared. The wound is located on the Left,Dorsal Foot. The wound measures 3cm length x 7cm width x 0.1cm depth; 16.493cm^2 area and 1.649cm^3 volume. There is no tunneling or undermining noted. There is a large amount of serous drainage noted. The wound margin is distinct with the outline attached to the wound  base. There is large (67-100%) red granulation within the wound bed. There is no necrotic tissue within the wound bed. 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 There was a Four Layer Compression Therapy Procedure by Levan Hurst, RN. Post procedure Diagnosis Wound #61: Same as Pre-Procedure Wound #62 Pre-procedure diagnosis of Wound #62 is a Lymphedema located on the Right,Circumferential Lower Leg . There was a Four Layer Compression Therapy Procedure by Levan Hurst, RN. Post procedure Diagnosis Wound #62: Same as Pre-Procedure Wound #63 Pre-procedure diagnosis of Wound #63 is a Lymphedema located on the Left,Dorsal Foot . There was a Four Layer Compression Therapy Procedure by Levan Hurst, RN. Post procedure Diagnosis Wound #63: Same as Pre-Procedure Plan Follow-up Appointments: Return Appointment in 2 weeks. Dressing Change Frequency: Wound #61 Left,Circumferential Lower Leg: Change dressing three times week. - Friday at wound center, Mon and Tues by home health Wound #62 Right,Circumferential Lower Leg: Change dressing three times week. - Friday at wound center, Mon and Tues by home health Wound #63 Left,Dorsal Foot: Change dressing three times week. - Friday at wound center, Mon and Tues by home health Skin Barriers/Peri-Wound Care: Antifungal cream - mixed with zinc oxide cream and triamcinolone cream to excoriated skin Barrier cream TCA Cream or Ointment Wound Cleansing: May shower with protection. Primary Wound Dressing: Wound #61 Left,Circumferential Lower Leg: Calcium Alginate with Silver Wound #62 Right,Circumferential Lower Leg: Calcium Alginate with Silver Wound #63 Left,Dorsal  Foot: Calcium Alginate with Silver Secondary Dressing: Wound #61 Left,Circumferential Lower Leg: ABD pad Kerramax - or zetuvit or equivalent super absorbant pad Wound #62 Right,Circumferential Lower Leg: ABD pad Kerramax - or zetuvit or equivalent super absorbant pad Wound #63 Left,Dorsal Foot: ABD pad Kerramax - or zetuvit or equivalent super absorbant pad Edema Control: 4 layer compression - Bilateral Avoid standing for long periods of time Elevate legs to the level of the heart or above for 30 minutes daily and/or when sitting, a frequency of: - throughout the day Exercise regularly Segmental Compressive Device. - lymphadema pumps 60 minutes 2 times per day Home Health: [facility] for wound care. - [facility] to continue wound care 1. Continue with silver alginate/ABDs 4-layer compression 2. The patient states that he is using his compression pumps which would be an improvement 3. He completed his antibiotics I gave him last week I did not culture Electronic Signature(s) Signed: 07/11/2019 6:06:00 PM By: Linton Ham MD Entered By: Linton Ham on 07/11/2019 15:23:05 -------------------------------------------------------------------------------- SuperBill Details Patient Name: Date of Service: ONEL, KONG 07/11/2019 Medical Record [id number] Patient Account Number: [id number] Date of Birth/Sex: Treating RN: [birth year] (63 y.o. 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

## 2019-07-14 DIAGNOSIS — I87333 Chronic venous hypertension (idiopathic) with ulcer and inflammation of bilateral lower extremity: Secondary | ICD-10-CM | POA: Diagnosis not present

## 2019-07-14 DIAGNOSIS — I509 Heart failure, unspecified: Secondary | ICD-10-CM | POA: Diagnosis not present

## 2019-07-14 DIAGNOSIS — L97219 Non-pressure chronic ulcer of right calf with unspecified severity: Secondary | ICD-10-CM | POA: Diagnosis not present

## 2019-07-14 DIAGNOSIS — L97829 Non-pressure chronic ulcer of other part of left lower leg with unspecified severity: Secondary | ICD-10-CM | POA: Diagnosis not present

## 2019-07-14 DIAGNOSIS — I890 Lymphedema, not elsewhere classified: Secondary | ICD-10-CM | POA: Diagnosis not present

## 2019-07-16 DIAGNOSIS — I87333 Chronic venous hypertension (idiopathic) with ulcer and inflammation of bilateral lower extremity: Secondary | ICD-10-CM | POA: Diagnosis not present

## 2019-07-16 DIAGNOSIS — L97829 Non-pressure chronic ulcer of other part of left lower leg with unspecified severity: Secondary | ICD-10-CM | POA: Diagnosis not present

## 2019-07-16 DIAGNOSIS — I890 Lymphedema, not elsewhere classified: Secondary | ICD-10-CM | POA: Diagnosis not present

## 2019-07-16 DIAGNOSIS — I509 Heart failure, unspecified: Secondary | ICD-10-CM | POA: Diagnosis not present

## 2019-07-16 DIAGNOSIS — L97219 Non-pressure chronic ulcer of right calf with unspecified severity: Secondary | ICD-10-CM | POA: Diagnosis not present

## 2019-07-18 DIAGNOSIS — E7800 Pure hypercholesterolemia, unspecified: Secondary | ICD-10-CM | POA: Diagnosis not present

## 2019-07-18 DIAGNOSIS — I10 Essential (primary) hypertension: Secondary | ICD-10-CM | POA: Diagnosis not present

## 2019-07-21 DIAGNOSIS — I509 Heart failure, unspecified: Secondary | ICD-10-CM | POA: Diagnosis not present

## 2019-07-21 DIAGNOSIS — I890 Lymphedema, not elsewhere classified: Secondary | ICD-10-CM | POA: Diagnosis not present

## 2019-07-21 DIAGNOSIS — L97219 Non-pressure chronic ulcer of right calf with unspecified severity: Secondary | ICD-10-CM | POA: Diagnosis not present

## 2019-07-21 DIAGNOSIS — L97829 Non-pressure chronic ulcer of other part of left lower leg with unspecified severity: Secondary | ICD-10-CM | POA: Diagnosis not present

## 2019-07-21 DIAGNOSIS — I87333 Chronic venous hypertension (idiopathic) with ulcer and inflammation of bilateral lower extremity: Secondary | ICD-10-CM | POA: Diagnosis not present

## 2019-07-23 DIAGNOSIS — L97219 Non-pressure chronic ulcer of right calf with unspecified severity: Secondary | ICD-10-CM | POA: Diagnosis not present

## 2019-07-23 DIAGNOSIS — I509 Heart failure, unspecified: Secondary | ICD-10-CM | POA: Diagnosis not present

## 2019-07-23 DIAGNOSIS — L97829 Non-pressure chronic ulcer of other part of left lower leg with unspecified severity: Secondary | ICD-10-CM | POA: Diagnosis not present

## 2019-07-23 DIAGNOSIS — I87333 Chronic venous hypertension (idiopathic) with ulcer and inflammation of bilateral lower extremity: Secondary | ICD-10-CM | POA: Diagnosis not present

## 2019-07-23 DIAGNOSIS — I890 Lymphedema, not elsewhere classified: Secondary | ICD-10-CM | POA: Diagnosis not present

## 2019-07-25 ENCOUNTER — Encounter (HOSPITAL_BASED_OUTPATIENT_CLINIC_OR_DEPARTMENT_OTHER): Payer: Medicare Other | Attending: Internal Medicine | Admitting: Internal Medicine

## 2019-07-25 ENCOUNTER — Other Ambulatory Visit: Payer: Self-pay

## 2019-07-25 DIAGNOSIS — I87333 Chronic venous hypertension (idiopathic) with ulcer and inflammation of bilateral lower extremity: Secondary | ICD-10-CM | POA: Insufficient documentation

## 2019-07-25 DIAGNOSIS — I509 Heart failure, unspecified: Secondary | ICD-10-CM | POA: Insufficient documentation

## 2019-07-25 DIAGNOSIS — L97521 Non-pressure chronic ulcer of other part of left foot limited to breakdown of skin: Secondary | ICD-10-CM | POA: Diagnosis not present

## 2019-07-25 DIAGNOSIS — I872 Venous insufficiency (chronic) (peripheral): Secondary | ICD-10-CM | POA: Insufficient documentation

## 2019-07-25 DIAGNOSIS — L03115 Cellulitis of right lower limb: Secondary | ICD-10-CM | POA: Diagnosis not present

## 2019-07-25 DIAGNOSIS — I890 Lymphedema, not elsewhere classified: Secondary | ICD-10-CM | POA: Diagnosis not present

## 2019-07-25 DIAGNOSIS — L97811 Non-pressure chronic ulcer of other part of right lower leg limited to breakdown of skin: Secondary | ICD-10-CM | POA: Diagnosis not present

## 2019-07-25 DIAGNOSIS — Z9119 Patient's noncompliance with other medical treatment and regimen: Secondary | ICD-10-CM | POA: Insufficient documentation

## 2019-07-25 DIAGNOSIS — S81801A Unspecified open wound, right lower leg, initial encounter: Secondary | ICD-10-CM | POA: Diagnosis not present

## 2019-07-25 DIAGNOSIS — S91302A Unspecified open wound, left foot, initial encounter: Secondary | ICD-10-CM | POA: Diagnosis not present

## 2019-07-25 DIAGNOSIS — S81802A Unspecified open wound, left lower leg, initial encounter: Secondary | ICD-10-CM | POA: Diagnosis not present

## 2019-07-25 DIAGNOSIS — L97821 Non-pressure chronic ulcer of other part of left lower leg limited to breakdown of skin: Secondary | ICD-10-CM | POA: Diagnosis not present

## 2019-07-27 NOTE — Progress Notes (Signed)
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 sitting at her bedside for most the day. His legs are probably dependent. Objective Constitutional Patient is hypertensive.. Pulse regular and within target range for patient.Marland Kitchen Respirations regular, non-labored and within  target range.. Temperature is normal and within the target range for the patient.Marland Kitchen Appears in no distress. Vitals Time Taken: 2:37 PM, Height: 70 in, Weight: 350 lbs, BMI: 50.2, Temperature: 97.9 F, Pulse: 53 bpm, Respiratory Rate: 24 breaths/min, Blood Pressure: 159/88 mmHg. Respiratory work of breathing is normal. Cardiovascular no evidence of CHF. JVP is not elevated. Pedal pulses palpable and strong bilaterally.Marland Kitchen edema control is marginal. Psychiatric appears at normal baseline. General Notes: wound exam; I think there is been improvement in most areas on his calves. He still has a large area on the lateral SIDES are not fully epithelialized. Areas on his right dorsal foot just proximal to his toes are new. Integumentary (Hair, Skin) Wound #61 status is Open. Original cause of wound was Gradually Appeared. The wound is located on the Left,Circumferential Lower Leg. The wound measures 11.5cm length x 8cm width x 0.1cm depth; 72.257cm^2 area and 7.226cm^3 volume. There is Fat Layer (Subcutaneous Tissue) Exposed exposed. There is no tunneling or undermining noted. There is a medium amount of serosanguineous drainage noted. The wound margin is flat and intact. There is large (67-100%) red granulation within the wound bed. There is no necrotic tissue within the wound bed. Wound #62 status is Open. Original cause of wound was Gradually Appeared. The wound is located on the Right,Circumferential Lower Leg. The wound measures 7.5cm length x 5.5cm width x 0.1cm depth; 32.398cm^2 area and 3.24cm^3 volume. There is Fat Layer (Subcutaneous  Tissue) Exposed exposed. There is no tunneling or undermining noted. There is a large amount of serosanguineous drainage noted. The wound margin is flat and intact. There is large (67-100%) red granulation within the wound bed. There is no necrotic tissue within the wound bed. Wound #63 status is Open. Original cause of wound was Gradually Appeared. The wound is  located on the Left,Dorsal Foot. The wound measures 4cm length x 6cm width x 0.1cm depth; 18.85cm^2 area and 1.885cm^3 volume. There is no tunneling or undermining noted. There is a large amount of serous drainage noted. The wound margin is distinct with the outline attached to the wound base. There is large (67-100%) red granulation within the wound bed. There is no necrotic tissue within the wound bed. Wound #64 status is Open. Original cause of wound was Gradually Appeared. The wound is located on the Right,Dorsal Foot. The wound measures 0.7cm length x 1.7cm width x 0.1cm depth; 0.935cm^2 area and 0.093cm^3 volume. There is no tunneling or undermining noted. There is a medium amount of serosanguineous drainage noted. The wound margin is distinct with the outline attached to the wound base. There is large (67-100%) red, pink granulation within the wound bed. There is no necrotic tissue within the wound bed. 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 There was a Four Layer Compression Therapy Procedure by Kela Millin, RN. Post procedure Diagnosis Wound #61: Same as Pre-Procedure Wound #62 Pre-procedure diagnosis of Wound #62 is a Lymphedema located on the Right,Circumferential Lower Leg . There was a Four Layer Compression Therapy Procedure by Kela Millin, RN. Post procedure Diagnosis Wound #62: Same as Pre-Procedure Wound #63 Pre-procedure diagnosis of Wound #63 is a  Lymphedema located on the Left,Dorsal Foot . There was a Four Layer Compression Therapy Procedure by Kela Millin, RN. Post procedure Diagnosis Wound #63: Same as Pre-Procedure Wound #64 Pre-procedure diagnosis of Wound #64 is a Lymphedema located on the Right,Dorsal Foot . There was a Four Layer Compression Therapy Procedure by Kela Millin, RN. Post procedure Diagnosis Wound #64: Same as Pre-Procedure Plan Follow-up Appointments: Return Appointment in 2 weeks. Dressing Change Frequency: Other: - change dressing 2 times per week, both legs Skin Barriers/Peri-Wound Care: Antifungal cream - mixed with zinc oxide cream and triamcinolone cream to excoriated skin Barrier cream TCA Cream or Ointment Wound Cleansing: May shower with protection. Primary Wound Dressing: Wound #61 Left,Circumferential Lower Leg: Calcium Alginate with Silver Wound #62 Right,Circumferential Lower Leg: Calcium Alginate with Silver Wound #63 Left,Dorsal Foot: Calcium Alginate with Silver Secondary Dressing: Wound #61 Left,Circumferential Lower Leg: ABD pad Kerramax - or zetuvit or equivalent super absorbant pad Wound #62 Right,Circumferential Lower Leg: ABD pad Kerramax - or zetuvit or equivalent super absorbant pad Wound #63 Left,Dorsal Foot: ABD pad Kerramax - or zetuvit or equivalent super absorbant pad Edema Control: 4 layer compression - Bilateral Avoid standing for long periods of time Elevate legs to the level of the heart or above for 30 minutes daily and/or when sitting, a frequency of: - throughout the day Exercise regularly Segmental Compressive Device. - lymphadema  pumps 60 minutes 2 times per day Home Health: [facility] for wound care. - [facility] to continue wound care 2 times per week #1Silver alginate to all open areas #2 ABDs #3 bilateral for lower compression continuous. I really don't think we have enough edema control to heal the  wounds especially on the lateral part of his calves. Even if we do heal this I am quite certain that compression stockings by themselves will not maintain skin integrity. #4 he has compression pumps but his noncompliance with the use has also almost become a joke between [place]. Electronic Signature(s) Signed: 07/27/2019 8:54:13 AM By: Linton Ham MD Entered By: Linton Ham on 07/26/2019 08:47:23 -------------------------------------------------------------------------------- SuperBill Details Patient Name: Date of Service: [name] 07/25/2019 Medical Record [id number] Patient Account Number: [id number] Date of Birth/Sex: Treating RN: 08-04-1956 (63 y.o. [name] Primary Care Provider: [name] Other Clinician: Referring Provider: Treating Provider/Extender:, Donnamae Jude, August Albino in Treatment: 6 Diagnosis Coding ICD-10 Codes Code Description 6080069019 Chronic venous hypertension (idiopathic) with ulcer and inflammation of bilateral lower extremity L97.811 Non-pressure chronic ulcer of other part of right lower leg limited to breakdown of skin L97.821 Non-pressure chronic ulcer of other part of left lower leg limited to breakdown of skin I89.0 Lymphedema, not elsewhere classified L97.521 Non-pressure chronic ulcer of other part of left foot limited to breakdown of skin Facility Procedures CPT4: Code [id number] Description: [id number] BILATERAL: Application of multi-layer venous compression system; leg (below knee), including ankle and foot. Modifier Quantity: 1 Physician Procedures CPT4 Code Description: [id number] - WC PHYS LEVEL 3 - EST PT ICD-10 Diagnosis Description L97.811 Non-pressure chronic ulcer of other part of right lower leg skin L97.821 Non-pressure chronic ulcer of other part of left lower leg skin I89.0  Lymphedema, not elsewhere classified Modifier: limited to brea limited to break Quantity: 1 kdown of down of Electronic  Signature(s) Signed: 07/27/2019 8:54:13 AM By: Linton Ham MD Previous Signature: 07/25/2019 6:04:56 PM Version By: Kela Millin Entered By: Linton Ham on 07/26/2019 08:48:04

## 2019-07-29 DIAGNOSIS — L97829 Non-pressure chronic ulcer of other part of left lower leg with unspecified severity: Secondary | ICD-10-CM | POA: Diagnosis not present

## 2019-07-29 DIAGNOSIS — L97219 Non-pressure chronic ulcer of right calf with unspecified severity: Secondary | ICD-10-CM | POA: Diagnosis not present

## 2019-07-29 DIAGNOSIS — I509 Heart failure, unspecified: Secondary | ICD-10-CM | POA: Diagnosis not present

## 2019-07-29 DIAGNOSIS — I890 Lymphedema, not elsewhere classified: Secondary | ICD-10-CM | POA: Diagnosis not present

## 2019-07-29 DIAGNOSIS — I87333 Chronic venous hypertension (idiopathic) with ulcer and inflammation of bilateral lower extremity: Secondary | ICD-10-CM | POA: Diagnosis not present

## 2019-07-31 DIAGNOSIS — I639 Cerebral infarction, unspecified: Secondary | ICD-10-CM | POA: Diagnosis not present

## 2019-07-31 DIAGNOSIS — E1129 Type 2 diabetes mellitus with other diabetic kidney complication: Secondary | ICD-10-CM | POA: Diagnosis not present

## 2019-07-31 DIAGNOSIS — I10 Essential (primary) hypertension: Secondary | ICD-10-CM | POA: Diagnosis not present

## 2019-07-31 DIAGNOSIS — E1121 Type 2 diabetes mellitus with diabetic nephropathy: Secondary | ICD-10-CM | POA: Diagnosis not present

## 2019-07-31 DIAGNOSIS — Z8673 Personal history of transient ischemic attack (TIA), and cerebral infarction without residual deficits: Secondary | ICD-10-CM | POA: Diagnosis not present

## 2019-07-31 DIAGNOSIS — I5033 Acute on chronic diastolic (congestive) heart failure: Secondary | ICD-10-CM | POA: Diagnosis not present

## 2019-07-31 DIAGNOSIS — E7800 Pure hypercholesterolemia, unspecified: Secondary | ICD-10-CM | POA: Diagnosis not present

## 2019-08-01 DIAGNOSIS — L97829 Non-pressure chronic ulcer of other part of left lower leg with unspecified severity: Secondary | ICD-10-CM | POA: Diagnosis not present

## 2019-08-01 DIAGNOSIS — I509 Heart failure, unspecified: Secondary | ICD-10-CM | POA: Diagnosis not present

## 2019-08-01 DIAGNOSIS — I87333 Chronic venous hypertension (idiopathic) with ulcer and inflammation of bilateral lower extremity: Secondary | ICD-10-CM | POA: Diagnosis not present

## 2019-08-01 DIAGNOSIS — L97219 Non-pressure chronic ulcer of right calf with unspecified severity: Secondary | ICD-10-CM | POA: Diagnosis not present

## 2019-08-01 DIAGNOSIS — I890 Lymphedema, not elsewhere classified: Secondary | ICD-10-CM | POA: Diagnosis not present

## 2019-08-04 DIAGNOSIS — I890 Lymphedema, not elsewhere classified: Secondary | ICD-10-CM | POA: Diagnosis not present

## 2019-08-04 DIAGNOSIS — L97829 Non-pressure chronic ulcer of other part of left lower leg with unspecified severity: Secondary | ICD-10-CM | POA: Diagnosis not present

## 2019-08-04 DIAGNOSIS — I87333 Chronic venous hypertension (idiopathic) with ulcer and inflammation of bilateral lower extremity: Secondary | ICD-10-CM | POA: Diagnosis not present

## 2019-08-04 DIAGNOSIS — I509 Heart failure, unspecified: Secondary | ICD-10-CM | POA: Diagnosis not present

## 2019-08-04 DIAGNOSIS — L97219 Non-pressure chronic ulcer of right calf with unspecified severity: Secondary | ICD-10-CM | POA: Diagnosis not present

## 2019-08-04 DIAGNOSIS — G4730 Sleep apnea, unspecified: Secondary | ICD-10-CM | POA: Diagnosis not present

## 2019-08-05 DIAGNOSIS — I87333 Chronic venous hypertension (idiopathic) with ulcer and inflammation of bilateral lower extremity: Secondary | ICD-10-CM | POA: Diagnosis not present

## 2019-08-05 DIAGNOSIS — Z1389 Encounter for screening for other disorder: Secondary | ICD-10-CM | POA: Diagnosis not present

## 2019-08-05 DIAGNOSIS — Z8673 Personal history of transient ischemic attack (TIA), and cerebral infarction without residual deficits: Secondary | ICD-10-CM | POA: Diagnosis not present

## 2019-08-05 DIAGNOSIS — Z6841 Body Mass Index (BMI) 40.0 and over, adult: Secondary | ICD-10-CM | POA: Diagnosis not present

## 2019-08-05 DIAGNOSIS — I509 Heart failure, unspecified: Secondary | ICD-10-CM | POA: Diagnosis not present

## 2019-08-05 DIAGNOSIS — I10 Essential (primary) hypertension: Secondary | ICD-10-CM | POA: Diagnosis not present

## 2019-08-05 DIAGNOSIS — Z1331 Encounter for screening for depression: Secondary | ICD-10-CM | POA: Diagnosis not present

## 2019-08-05 DIAGNOSIS — L97829 Non-pressure chronic ulcer of other part of left lower leg with unspecified severity: Secondary | ICD-10-CM | POA: Diagnosis not present

## 2019-08-05 DIAGNOSIS — E1121 Type 2 diabetes mellitus with diabetic nephropathy: Secondary | ICD-10-CM | POA: Diagnosis not present

## 2019-08-05 DIAGNOSIS — I890 Lymphedema, not elsewhere classified: Secondary | ICD-10-CM | POA: Diagnosis not present

## 2019-08-05 DIAGNOSIS — L97219 Non-pressure chronic ulcer of right calf with unspecified severity: Secondary | ICD-10-CM | POA: Diagnosis not present

## 2019-08-05 DIAGNOSIS — Z23 Encounter for immunization: Secondary | ICD-10-CM | POA: Diagnosis not present

## 2019-08-05 DIAGNOSIS — E1129 Type 2 diabetes mellitus with other diabetic kidney complication: Secondary | ICD-10-CM | POA: Diagnosis not present

## 2019-08-08 ENCOUNTER — Ambulatory Visit (HOSPITAL_BASED_OUTPATIENT_CLINIC_OR_DEPARTMENT_OTHER): Payer: Medicare Other | Admitting: Internal Medicine

## 2019-08-08 DIAGNOSIS — L97829 Non-pressure chronic ulcer of other part of left lower leg with unspecified severity: Secondary | ICD-10-CM | POA: Diagnosis not present

## 2019-08-08 DIAGNOSIS — L97219 Non-pressure chronic ulcer of right calf with unspecified severity: Secondary | ICD-10-CM | POA: Diagnosis not present

## 2019-08-08 DIAGNOSIS — I509 Heart failure, unspecified: Secondary | ICD-10-CM | POA: Diagnosis not present

## 2019-08-08 DIAGNOSIS — I890 Lymphedema, not elsewhere classified: Secondary | ICD-10-CM | POA: Diagnosis not present

## 2019-08-08 DIAGNOSIS — I87333 Chronic venous hypertension (idiopathic) with ulcer and inflammation of bilateral lower extremity: Secondary | ICD-10-CM | POA: Diagnosis not present

## 2019-08-12 DIAGNOSIS — I509 Heart failure, unspecified: Secondary | ICD-10-CM | POA: Diagnosis not present

## 2019-08-12 DIAGNOSIS — I890 Lymphedema, not elsewhere classified: Secondary | ICD-10-CM | POA: Diagnosis not present

## 2019-08-12 DIAGNOSIS — L97219 Non-pressure chronic ulcer of right calf with unspecified severity: Secondary | ICD-10-CM | POA: Diagnosis not present

## 2019-08-12 DIAGNOSIS — L97829 Non-pressure chronic ulcer of other part of left lower leg with unspecified severity: Secondary | ICD-10-CM | POA: Diagnosis not present

## 2019-08-12 DIAGNOSIS — I87333 Chronic venous hypertension (idiopathic) with ulcer and inflammation of bilateral lower extremity: Secondary | ICD-10-CM | POA: Diagnosis not present

## 2019-08-15 DIAGNOSIS — I509 Heart failure, unspecified: Secondary | ICD-10-CM | POA: Diagnosis not present

## 2019-08-15 DIAGNOSIS — L97219 Non-pressure chronic ulcer of right calf with unspecified severity: Secondary | ICD-10-CM | POA: Diagnosis not present

## 2019-08-15 DIAGNOSIS — I87333 Chronic venous hypertension (idiopathic) with ulcer and inflammation of bilateral lower extremity: Secondary | ICD-10-CM | POA: Diagnosis not present

## 2019-08-15 DIAGNOSIS — L97829 Non-pressure chronic ulcer of other part of left lower leg with unspecified severity: Secondary | ICD-10-CM | POA: Diagnosis not present

## 2019-08-15 DIAGNOSIS — I890 Lymphedema, not elsewhere classified: Secondary | ICD-10-CM | POA: Diagnosis not present

## 2019-08-19 DIAGNOSIS — I509 Heart failure, unspecified: Secondary | ICD-10-CM | POA: Diagnosis not present

## 2019-08-19 DIAGNOSIS — I890 Lymphedema, not elsewhere classified: Secondary | ICD-10-CM | POA: Diagnosis not present

## 2019-08-19 DIAGNOSIS — L97829 Non-pressure chronic ulcer of other part of left lower leg with unspecified severity: Secondary | ICD-10-CM | POA: Diagnosis not present

## 2019-08-19 DIAGNOSIS — I87333 Chronic venous hypertension (idiopathic) with ulcer and inflammation of bilateral lower extremity: Secondary | ICD-10-CM | POA: Diagnosis not present

## 2019-08-19 DIAGNOSIS — L97219 Non-pressure chronic ulcer of right calf with unspecified severity: Secondary | ICD-10-CM | POA: Diagnosis not present

## 2019-08-22 ENCOUNTER — Other Ambulatory Visit: Payer: Self-pay

## 2019-08-22 ENCOUNTER — Encounter (HOSPITAL_BASED_OUTPATIENT_CLINIC_OR_DEPARTMENT_OTHER): Payer: Medicare Other | Attending: Internal Medicine | Admitting: Internal Medicine

## 2019-08-22 DIAGNOSIS — G4730 Sleep apnea, unspecified: Secondary | ICD-10-CM | POA: Insufficient documentation

## 2019-08-22 DIAGNOSIS — I509 Heart failure, unspecified: Secondary | ICD-10-CM | POA: Diagnosis not present

## 2019-08-22 DIAGNOSIS — Z6841 Body Mass Index (BMI) 40.0 and over, adult: Secondary | ICD-10-CM | POA: Diagnosis not present

## 2019-08-22 DIAGNOSIS — I890 Lymphedema, not elsewhere classified: Secondary | ICD-10-CM | POA: Insufficient documentation

## 2019-08-22 DIAGNOSIS — L97229 Non-pressure chronic ulcer of left calf with unspecified severity: Secondary | ICD-10-CM | POA: Diagnosis not present

## 2019-08-22 DIAGNOSIS — I110 Hypertensive heart disease with heart failure: Secondary | ICD-10-CM | POA: Insufficient documentation

## 2019-08-22 DIAGNOSIS — I87333 Chronic venous hypertension (idiopathic) with ulcer and inflammation of bilateral lower extremity: Secondary | ICD-10-CM | POA: Diagnosis not present

## 2019-08-22 DIAGNOSIS — L97821 Non-pressure chronic ulcer of other part of left lower leg limited to breakdown of skin: Secondary | ICD-10-CM | POA: Insufficient documentation

## 2019-08-22 DIAGNOSIS — G4733 Obstructive sleep apnea (adult) (pediatric): Secondary | ICD-10-CM | POA: Diagnosis not present

## 2019-08-22 DIAGNOSIS — L97811 Non-pressure chronic ulcer of other part of right lower leg limited to breakdown of skin: Secondary | ICD-10-CM | POA: Diagnosis not present

## 2019-08-22 DIAGNOSIS — L97521 Non-pressure chronic ulcer of other part of left foot limited to breakdown of skin: Secondary | ICD-10-CM | POA: Insufficient documentation

## 2019-08-22 DIAGNOSIS — L97819 Non-pressure chronic ulcer of other part of right lower leg with unspecified severity: Secondary | ICD-10-CM | POA: Diagnosis not present

## 2019-08-22 DIAGNOSIS — I872 Venous insufficiency (chronic) (peripheral): Secondary | ICD-10-CM | POA: Diagnosis not present

## 2019-08-26 DIAGNOSIS — L97829 Non-pressure chronic ulcer of other part of left lower leg with unspecified severity: Secondary | ICD-10-CM | POA: Diagnosis not present

## 2019-08-26 DIAGNOSIS — L97219 Non-pressure chronic ulcer of right calf with unspecified severity: Secondary | ICD-10-CM | POA: Diagnosis not present

## 2019-08-26 DIAGNOSIS — I890 Lymphedema, not elsewhere classified: Secondary | ICD-10-CM | POA: Diagnosis not present

## 2019-08-26 DIAGNOSIS — I87333 Chronic venous hypertension (idiopathic) with ulcer and inflammation of bilateral lower extremity: Secondary | ICD-10-CM | POA: Diagnosis not present

## 2019-08-26 DIAGNOSIS — I509 Heart failure, unspecified: Secondary | ICD-10-CM | POA: Diagnosis not present

## 2019-08-26 NOTE — Progress Notes (Signed)
[name], [name] ([id number]) Visit Report for 07/11/2019 Arrival Information Details Patient Name: Date of Service: [name] [name], [name] [name] 07/11/2019 1:00 PM Medical Record [id number] Patient Account Number: [id number] Date of Birth/Sex: Treating RN: 1955/12/16 (63 y.o. [name] [name] Primary Care Aashritha Miedema: [name] Other Clinician: Referring Gerrell Tabet: Treating Shonita Rinck/Extender:Robson, Donnamae Jude, August Albino in Treatment: 4 Visit Information History Since Last Visit Cane Added or deleted any medications: No Patient Arrived: 13:30 Any new allergies or adverse reactions: No Arrival Time: Had a fall or experienced change in No Accompanied By: self None activities of daily living that may affect Transfer Assistance: risk of falls: Patient Identification Verified: Yes Signs or symptoms of abuse/neglect since last No Secondary Verification Process Completed: Yes visito Patient Requires Transmission-Based No Hospitalized since last visit: No Precautions: Implantable device outside of the clinic excluding No Patient Has Alerts: No cellular tissue based products placed in the center since last visit: Has Dressing in Place as Prescribed: Yes Pain Present Now: No Electronic Signature(s) Signed: 08/26/2019 3:02:15 PM By: Sandre Kitty Entered By: Sandre Kitty on 07/11/2019 13:30:56 -------------------------------------------------------------------------------- Compression Therapy Details Patient Name: Date of Service: [name], [name] 07/11/2019 1:00 PM Medical Record [id number] Patient Account Number: [id number] Date of Birth/Sex: Treating RN: Dec 20, 1955 (63 y.o. [name] [name] Primary Care [name] [name]: [name] Other Clinician: Referring [name] [name]: Treating Jolicia Delira/Extender:Robson, Donnamae Jude, August Albino in Treatment: 4 Compression Therapy Performed for Wound Wound #61 Left,Circumferential Lower Leg Assessment: Performed By: Clinician  Levan Hurst, RN Compression Type: Four Layer Post Procedure Diagnosis Same as Pre-procedure Electronic Signature(s) Signed: 07/11/2019 5:59:58 PM By: Levan Hurst RN, BSN Entered By: Levan Hurst on 07/11/2019 14:20:48 -------------------------------------------------------------------------------- Compression Therapy Details Patient Name: Date of Service: [name]. 07/11/2019 1:00 PM Medical Record [id number] Patient Account Number: [id number] Date of Birth/Sex: Treating RN: 07/24/1956 (63 y.o. [name] [name] Primary Care Usha Slager: [name] Other Clinician: Referring [name] [name]: Treating Daena Alper/Extender:Robson, Donnamae Jude, August Albino in Treatment: 4 Compression Therapy Performed for Wound Wound #62 Right,Circumferential Lower Leg Assessment: Performed By: Clinician Levan Hurst, RN Compression Type: Four Layer Post Procedure Diagnosis Same as Pre-procedure Electronic Signature(s) Signed: 07/11/2019 5:59:58 PM By: Levan Hurst RN, BSN Entered By: Levan Hurst on 07/11/2019 14:20:48 -------------------------------------------------------------------------------- Compression Therapy Details Patient Name: Date of Service: [name]. 07/11/2019 1:00 PM Medical Record [id number] Patient Account Number: [id number] Date of Birth/Sex: Treating RN: Dec 04, 1955 (63 y.o. [name] [name] Primary Care [name] [name]: [name] Other Clinician: Referring [name] [name]: Treating [name] [name]/Extender:Robson, Donnamae Jude, August Albino in Treatment: 4 Compression Therapy Performed for Wound Wound #63 Left,Dorsal Foot Assessment: Performed By: Clinician Levan Hurst, RN Compression Type: Four Layer Post Procedure Diagnosis Same as Pre-procedure Electronic Signature(s) Signed: 07/11/2019 5:59:58 PM By: Levan Hurst RN, BSN Entered By: Levan Hurst on 07/11/2019  14:20:48 -------------------------------------------------------------------------------- Encounter Discharge Information Details Patient Name: Date of Service: [name]. 07/11/2019 1:00 PM Medical Record [id number] Patient Account Number: [id number] Date of Birth/Sex: Treating RN: Jan 07, 1956 (63 y.o. [name] [name] Primary Care Shiloh Swopes: [name] Other Clinician: Referring [name] [name]: Treating [name] [name]/Extender:Robson, Donnamae Jude, August Albino in Treatment: 4 Encounter Discharge Information Items Discharge Condition: Stable Ambulatory Status: Cane Discharge Destination: Home Transportation: Private Auto Accompanied By: self Schedule Follow-up Appointment: Yes Clinical Summary of Care: Patient Declined Electronic Signature(s) Signed: 07/14/2019 5:21:50 PM By: Kela Millin Entered By: Kela Millin on 07/11/2019 18:49:09 -------------------------------------------------------------------------------- Lower Extremity Assessment Details Patient Name: Date of Service: [name] [name] 07/11/2019 1:00 PM Medical Record [id number] Patient Account Number: [id number] Date of Birth/Sex: Treating RN: 04-Jan-1956 (  63 y.o. [name] [name] Primary Care Jordie Schreur: [name] Other Clinician: Referring Harun Brumley: Treating Mack Thurmon/Extender:Robson, Donnamae Jude, August Albino in Treatment: 4 Edema Assessment Assessed: [Left: No] [Right: No] Edema: [Left: Yes] [Right: Yes] Calf Left: Right: Point of Measurement: 38 cm From Medial Instep 42.5 cm 53 cm Ankle Left: Right: Point of Measurement: 10 cm From Medial Instep 26 cm 32 cm Vascular Assessment Pulses: Dorsalis Pedis Palpable: [Left:Yes] [Right:Yes] Electronic Signature(s) Signed: 07/11/2019 5:59:58 PM By: Levan Hurst RN, BSN Entered By: Levan Hurst on 07/11/2019 14:18:20 -------------------------------------------------------------------------------- [facility]  Details Patient Name: Date of Service: [name]. 07/11/2019 1:00 PM Medical Record [id number] Patient Account Number: [id number] Date of Birth/Sex: Treating RN: 1955/11/20 (63 y.o. [name] [name] Primary Care Dontavius Keim: [name] Other Clinician: Referring Tanaysha Alkins: Treating Salvator Seppala/Extender:Robson, Donnamae Jude, August Albino in Treatment: 4 Active Inactive Venous Leg Ulcer Nursing Diagnoses: Actual venous Insuffiency (use after diagnosis is confirmed) Knowledge deficit related to disease process and management Goals: Patient will maintain optimal edema control Date Initiated: 06/13/2019 Target Resolution Date: 08/08/2019 Goal Status: Active Interventions: Compression as ordered Provide education on venous insufficiency Treatment Activities: Therapeutic compression applied : 06/13/2019 Notes: Wound/Skin Impairment Nursing Diagnoses: Impaired tissue integrity Knowledge deficit related to ulceration/compromised skin integrity Goals: Patient/caregiver will verbalize understanding of skin care regimen Date Initiated: 06/13/2019 Target Resolution Date: 08/08/2019 Goal Status: Active Ulcer/skin breakdown will have a volume reduction of 30% by week 4 Target Resolution Date Initiated: 06/13/2019 Date Inactivated: 07/11/2019 Date: 07/11/2019 Goal Status: Met Interventions: Assess patient/caregiver ability to obtain necessary supplies Assess patient/caregiver ability to perform ulcer/skin care regimen upon admission and as needed Assess ulceration(s) every visit Treatment Activities: Skin care regimen initiated : 06/13/2019 Topical wound management initiated : 06/13/2019 Notes: Electronic Signature(s) Signed: 07/11/2019 5:59:58 PM By: Levan Hurst RN, BSN Entered By: Levan Hurst on 07/11/2019 13:34:44 -------------------------------------------------------------------------------- Pain Assessment Details Patient Name: Date of Service: [name]. 07/11/2019 1:00 PM Medical Record [id number] Patient Account Number: [id number] Date of Birth/Sex: Treating RN: February 27, 1956 (63 y.o. [name] [name] Primary Care Dorethea Strubel: [name] Other Clinician: Referring Abir Eroh: Treating Lexie Koehl/Extender:Robson, Donnamae Jude, August Albino in Treatment: 4 Active Problems Location of Pain Severity and Description of Pain Patient Has Paino No Site Locations Pain Management and Medication Current Pain Management: Electronic Signature(s) Signed: 07/11/2019 5:59:58 PM By: Levan Hurst RN, BSN Signed: 08/26/2019 3:02:15 PM By: Sandre Kitty Entered By: Sandre Kitty on 07/11/2019 13:33:35 -------------------------------------------------------------------------------- Patient/Caregiver Education Details Patient Name: Date of Service: Bridgett, Handy W. 10/23/2020andnbsp1:00 PM Medical Record [id number] Patient Account Number: [id number] Date of Birth/Gender: Treating RN: 10-Aug-1956 (63 y.o. [name] [name] Primary Care Physician: [name] Other Clinician: Referring Physician: Treating Physician/Extender:Robson, Donnamae Jude, August Albino in Treatment: 4 Education Assessment Education Provided To: Patient Education Topics Provided Wound/Skin Impairment: Methods: Explain/Verbal Responses: State content correctly Electronic Signature(s) Signed: 07/11/2019 5:59:58 PM By: Levan Hurst RN, BSN Entered By: Levan Hurst on 07/11/2019 13:34:58 -------------------------------------------------------------------------------- Wound Assessment Details Patient Name: Date of Service: [name] 07/11/2019 1:00 PM Medical Record [id number] Patient Account Number: [id number] Date of Birth/Sex: Treating RN: 27-Dec-1955 (63 y.o. [name] [name] Primary Care Thereasa Iannello: [name] Other Clinician: Referring Adessa Primiano: Treating Matrice Herro/Extender:Robson, Donnamae Jude, August Albino in Treatment: 4 Wound  Status Wound Number: 61 Primary Etiology: Lymphedema Wound Location: Left Lower Leg - Circumferential Wound Status: Open Wounding Event: Gradually Appeared Comorbid History: Sleep Apnea, Hypertension Date Acquired: 04/19/2019 Weeks Of Treatment: 4 Clustered Wound: No Photos Wound Measurements Length: (cm) 9.5 % Reduc Width: (  cm) 8 % Reduc Depth: (cm) 0.1 Epithel Area: (cm) 59.69 Tunnel Volume: (cm) 5.969 Underm Wound Description Classification: Full Thickness Without Exposed Support Foul O Structures Due to Wound Slough Flat and Intact Margin: Exudate Medium Amount: Exudate Serosanguineous Type: Exudate red, brown Color: Wound Bed Granulation Amount: Large (67-100%) Granulation Quality: Red Fascia Necrotic Amount: None Present (0%) Fat Lay Tendon Muscle Joint E Bone Ex dor After Cleansing: Yes Product Use: No /Fibrino No Exposed Structure Exposed: No er (Subcutaneous Tissue) Exposed: Yes Exposed: No Exposed: No xposed: No posed: No tion in Area: 92.1% tion in Volume: 92.1% ialization: Medium (34-66%) ing: No ining: No Electronic Signature(s) Signed: 07/14/2019 4:53:58 PM By: Mikeal Hawthorne EMT/HBOT Signed: 07/14/2019 6:04:10 PM By: Levan Hurst RN, BSN Previous Signature: 07/11/2019 5:59:58 PM Version By: Levan Hurst RN, BSN Entered By: Mikeal Hawthorne on 07/14/2019 08:44:29 -------------------------------------------------------------------------------- Wound Assessment Details Patient Name: Date of Service: [name]. 07/11/2019 1:00 PM Medical Record [id number] Patient Account Number: [id number] Date of Birth/Sex: Treating RN: [birth year] (63 y.o. [name] [name] Primary Care Keylen Uzelac: Other Clinician: [name] Referring Jadence Kinlaw: Treating Kieli Golladay/Extender:Robson, Donnamae Jude, August Albino in Treatment: 4 Wound Status Wound Number: 62 Primary Etiology: Lymphedema Wound Location: Right Lower Leg -  Circumferential Wound Status: Open Wounding Event: Gradually Appeared Comorbid History: Sleep Apnea, Hypertension Date Acquired: 04/19/2019 Weeks Of Treatment: 4 Clustered Wound: No Photos Wound Measurements Length: (cm) 9.5 % Reduc Width: (cm) 8.2 % Reduc Depth: (cm) 0.1 Epithel Area: (cm) 61.183 Tunnel Volume: (cm) 6.118 Underm Wound Description Full Thickness Without Exposed Support Foul O Classification: Structures Slough Wound Flat and Intact Margin: Exudate Large Amount: Exudate Serosanguineous Type: Exudate red, brown Color: Wound Bed Granulation Amount: Large (67-100%) Granulation Quality: Red Fascia Necrotic Amount: None Present (0%) Fat Lay Tendon Muscle Joint E Bone Ex dor After Cleansing: No /Fibrino No Exposed Structure Exposed: No er (Subcutaneous Tissue) Exposed: Yes Exposed: No Exposed: No xposed: No posed: No tion in Area: 92.6% tion in Volume: 92.6% ialization: Medium (34-66%) ing: No ining: No Electronic Signature(s) Signed: 07/14/2019 4:53:58 PM By: Mikeal Hawthorne EMT/HBOT Signed: 07/14/2019 6:04:10 PM By: Levan Hurst RN, BSN Previous Signature: 07/11/2019 5:59:58 PM Version By: Levan Hurst RN, BSN Entered By: Mikeal Hawthorne on 07/14/2019 08:44:53 -------------------------------------------------------------------------------- Wound Assessment Details Patient Name: Date of Service: [name]. 07/11/2019 1:00 PM Medical Record [id number] Patient Account Number: [id number] Date of Birth/Sex: Treating RN: Jan 16, 1956 (63 y.o. [name] [name] Primary Care Rollande Thursby: [name] Other Clinician: Referring Maryln Eastham: Treating Tamsin Nader/Extender:Robson, Donnamae Jude, August Albino in Treatment: 4 Wound Status Wound Number: 63 Primary Etiology: Lymphedema Wound Location: Left Foot - Dorsal Wound Status: Open Wounding Event: Gradually Appeared Comorbid History: Sleep Apnea, Hypertension Date Acquired:  06/23/2019 Weeks Of Treatment: 2 Clustered Wound: No Photos Wound Measurements Length: (cm) 3 % Reduc Width: (cm) 7 % Reduc Depth: (cm) 0.1 Epithel Area: (cm) 16.493 Tunnel Volume: (cm) 1.649 Underm Wound Description Classification: Full Thickness Without Exposed Support Foul O Structures Slough Wound Distinct, outline attached Margin: Exudate Large Amount: Exudate Serous Type: Exudate amber Color: Wound Bed Granulation Amount: Large (67-100%) Granulation Quality: Red Fascia Necrotic Amount: None Present (0%) Fat Lay Tendon Muscle Joint Expos Bone Expose dor After Cleansing: No /Fibrino No Exposed Structure Exposed: No er (Subcutaneous Tissue) Exposed: No Exposed: No Exposed: No ed: No d: No tion in Area: -8314.8% tion in Volume: -8145% ialization: Small (1-33%) ing: No ining: No Electronic Signature(s) Signed: 07/14/2019 4:53:58 PM By: Mikeal Hawthorne EMT/HBOT Signed: 07/14/2019 6:04:10 PM By:  Levan Hurst RN, BSN Previous Signature: 07/11/2019 5:59:58 PM Version By: Levan Hurst RN, BSN Entered By: Mikeal Hawthorne on 07/14/2019 08:44:07 -------------------------------------------------------------------------------- Vitals Details Patient Name: Date of Service: [name]. 07/11/2019 1:00 PM Medical Record [id number] Patient Account Number: [id number] Date of Birth/Sex: Treating RN: Jun 04, 1956 (63 y.o. [name] [name] Primary Care Elissa Grieshop: [name] Other Clinician: Referring Saadiya Wilfong: Treating Davelle Anselmi/Extender:Robson, Donnamae Jude, August Albino in Treatment: 4 Vital Signs Time Taken: 13:30 Temperature (F): 97.7 Height (in): 70 Pulse (bpm): 56 Weight (lbs): 350 Respiratory Rate (breaths/min): 24 Body Mass Index (BMI): 50.2 Blood Pressure (mmHg): 139/62 Reference Range: 80 - 120 mg / dl Electronic Signature(s) Signed: 08/26/2019 3:02:15 PM By: Sandre Kitty Entered By: Sandre Kitty on 07/11/2019 13:33:21

## 2019-08-26 NOTE — Progress Notes (Signed)
[name] [name] ([id number]) Visit Report for 06/13/2019 Allergy List Details Patient Name: Date of Service: [name] [name], [name] [name] 06/13/2019 1:15 PM Medical Record [id number] Patient Account Number: [id number] Date of Birth/Sex: Treating RN: Jul 22, 1956 (63 y.o. [name] Primary Care Alizandra Loh: [name] Other Clinician: Referring Francesa Eugenio: Treating Gara Kincade/Extender:Robson, Donnamae Jude, August Albino in Treatment: 0 Allergies Active Allergies No Known Allergies Allergy Notes Electronic Signature(s) Signed: 08/26/2019 3:04:24 PM By: Sandre Kitty Entered By: Sandre Kitty on 06/13/2019 13:09:48 -------------------------------------------------------------------------------- Arrival Information Details Patient Name: Date of Service: [name] [name] 06/13/2019 1:15 PM Medical Record [id number] Patient Account Number: [id number] Date of Birth/Sex: Treating RN: Apr 18, 1956 (63 y.o. [name] Primary Care Stein Windhorst: [name] Other Clinician: Referring Jaleah Lefevre: Treating Tashina Credit/Extender:Robson, Donnamae Jude, August Albino in Treatment: 0 Visit Information Cane A Patient Arrived: 12:53 Arrival Time: Accompanied By: self None H Transfer Assistance: Patient Identification Verified: Yes Secondary Verification Process Completed: Yes Patient Requires Transmission-Based No Precautions: Patient Has Alerts: No History Since Last Visit ll ordered tests and consults were completed: No Added or deleted any medications: No Any new allergies or adverse reactions: No ad a fall or experienced change in activities of daily living that may affect risk of falls: No Signs or symptoms of abuse/neglect since last visito No Hospitalized since last visit: No Implantable device outside of the clinic excluding cellular tissue based products placed in the center since last visit: No Has Dressing in Place as Prescribed: Yes Has Compression in Place as  Prescribed: Yes Pain Present Now: No Electronic Signature(s) Signed: 08/26/2019 3:04:24 PM By: Sandre Kitty Entered By: Sandre Kitty on 06/13/2019 13:04:01 -------------------------------------------------------------------------------- Clinic Level of Care Assessment Details Patient Name: Date of Service: [name] [name] 06/13/2019 1:15 PM Medical Record [id number] Patient Account Number: [id number] Date of Birth/Sex: Treating RN: [birth year] (63 y.o. [name] Primary Care Nedda Gains: [name] Other Clinician: Referring Kaytlynn Kochan: Treating Khalifa Knecht/Extender:Robson, Donnamae Jude, August Albino in Treatment: 0 Clinic Level of Care Assessment Items TOOL 1 Quantity Score []  - Use when EandM and Procedure is performed on INITIAL visit 0 ASSESSMENTS - Nursing Assessment / Reassessment X - General Physical Exam (combine w/ comprehensive assessment (listed just below) 1 20 when performed on new pt. evals) X - Comprehensive Assessment (HX, ROS, Risk Assessments, Wounds Hx, etc.) 1 25 ASSESSMENTS - Wound and Skin Assessment / Reassessment []  - Dermatologic / Skin Assessment (not related to wound area) 0 ASSESSMENTS - Ostomy and/or Continence Assessment and Care []  - Incontinence Assessment and Management 0 []  - Ostomy Care Assessment and Management (repouching, etc.) 0 PROCESS - Coordination of Care X - Simple Patient / Family Education for ongoing care 1 15 []  - Complex (extensive) Patient / Family Education for ongoing care 0 X - Staff obtains Programmer, systems, Records, Test Results / Process Orders 1 10 X - Staff telephones HHA, Nursing Homes / Clarify orders / etc 1 10 []  - Routine Transfer to another Facility (non-emergent condition) 0 []  - Routine Hospital Admission (non-emergent condition) 0 X - New Admissions / Biomedical engineer / Ordering NPWT, Apligraf, etc. 1 15 []  - Emergency Hospital Admission (emergent condition) 0 PROCESS - Special Needs []  -  Pediatric / Minor Patient Management 0 []  - Isolation Patient Management 0 []  - Hearing / Language / Visual special needs 0 []  - Assessment of Community assistance (transportation, D/C planning, etc.) 0 []  - Additional assistance / Altered mentation 0 []  - Support Surface(s) Assessment (bed, cushion, seat, etc.) 0 INTERVENTIONS - Miscellaneous []  - External ear  exam 0 []  - Patient Transfer (multiple staff / Civil Service fast streamer / Similar devices) 0 []  - Simple Staple / Suture removal (25 or less) 0 []  - Complex Staple / Suture removal (26 or more) 0 []  - Hypo/Hyperglycemic Management (do not check if billed separately) 0 X - Ankle / Brachial Index (ABI) - do not check if billed separately 1 15 Has the patient been seen at the hospital within the last three years: Yes Total Score: 110 Level Of Care: New/Established - Level 3 Electronic Signature(s) Signed: 06/13/2019 5:10:27 PM By: [name] RN, BSN Entered By: [name] on 06/13/2019 14:25:21 -------------------------------------------------------------------------------- Compression Therapy Details Patient Name: Date of Service: [name] [name] 06/13/2019 1:15 PM Medical Record [id number] Patient Account Number: [id number] Date of Birth/Sex: Treating RN: 07-Feb-1956 (63 y.o. [name] Primary Care Tally Mattox: [name] Other Clinician: Referring Bostyn Bogie: Treating Jadis Pitter/Extender:Robson, Donnamae Jude, August Albino in Treatment: 0 Compression Therapy Performed for Wound Wound #61 Left,Circumferential Lower Leg Assessment: Performed By: Clinician Kela Millin, RN Compression Type: Four Layer Post Procedure Diagnosis Same as Pre-procedure Electronic Signature(s) Signed: 06/13/2019 5:10:27 PM By: [name] RN, BSN Entered By: [name] on 06/13/2019 14:25:47 -------------------------------------------------------------------------------- Compression Therapy Details Patient Name: Date of  Service: [name] [name] 06/13/2019 1:15 PM Medical Record [id number] Patient Account Number: [id number] Date of Birth/Sex: Treating RN: 1956/05/01 (63 y.o. [name] Primary Care Argil Mahl: [name] Other Clinician: Referring Mordecai Tindol: Treating Phillippa Straub/Extender:Robson, Donnamae Jude, August Albino in Treatment: 0 Compression Therapy Performed for Wound Wound #62 Right,Circumferential Lower Leg Assessment: Performed By: Clinician Kela Millin, RN Compression Type: Four Layer Post Procedure Diagnosis Same as Pre-procedure Electronic Signature(s) Signed: 06/13/2019 5:10:27 PM By: [name] RN, BSN Entered By: [name] on 06/13/2019 14:25:47 -------------------------------------------------------------------------------- Encounter Discharge Information Details Patient Name: Date of Service: Khyre, Sheth 06/13/2019 1:15 PM Medical Record [id number] Patient Account Number: [id number] Date of Birth/Sex: Treating RN: 06-Jul-1956 (63 y.o. [name] Primary Care Odean Fester: [name] Other Clinician: Referring Sofya Moustafa: Treating Jaymari Cromie/Extender:Robson, Donnamae Jude, August Albino in Treatment: 0 Encounter Discharge Information Items Discharge Condition: Stable Ambulatory Status: Cane Discharge Destination: Home Transportation: Private Auto Accompanied By: alone Schedule Follow-up Appointment: Yes Clinical Summary of Care: Patient Declined Electronic Signature(s) Signed: 06/16/2019 6:25:06 PM By: Levan Hurst RN, BSN Entered By: Levan Hurst on 06/13/2019 16:09:45 -------------------------------------------------------------------------------- Lower Extremity Assessment Details Patient Name: Date of Service: Mukul, Krahmer 06/13/2019 1:15 PM Medical Record [id number] Patient Account Number: [id number] Date of Birth/Sex: Treating RN: 31-Jul-1956 (63 y.o. [name] Primary Care Samyiah Halvorsen: [name]  Other Clinician: Referring Jaeleen Inzunza: Treating Shields Pautz/Extender:Robson, Donnamae Jude, August Albino in Treatment: 0 Edema Assessment Assessed: [name]: Yes] [Right: Yes] Edema: [Left: Yes] [Right: Yes] Calf Left: Right: Point of Measurement: 38 cm From Medial Instep 45 cm 46 cm Ankle Left: Right: Point of Measurement: 10 cm From Medial Instep 26 cm 31 cm Vascular Assessment Blood Pressure: Brachial: [Left:158] [Right:158] Dorsalis Pedis: 200 [Left:Dorsalis Pedis: [id number] Ankle: [Left:Posterior Tibial: 198 1.27] [Right:Posterior Tibial: 190 1.25] Electronic Signature(s) Signed: 06/13/2019 5:10:27 PM By: [name] RN, BSN Signed: 06/16/2019 6:25:06 PM By: Levan Hurst RN, BSN Entered By: Levan Hurst on 06/13/2019 13:42:12 -------------------------------------------------------------------------------- [place] Details Patient Name: Date of Service: Sahir, Cepin 06/13/2019 1:15 PM Medical Record [id number] Patient Account Number: [id number] Date of Birth/Sex: Treating RN: [birth year] (63 y.o. [name] Primary Care Lalonnie Shaffer: [name] Other Clinician: Referring Allayna Erlich: Treating Kollyn Lingafelter/Extender:Robson, Donnamae Jude, August Albino in Treatment: 0 Active Inactive Abuse / Safety /  Falls / Self Care Management Nursing Diagnoses: History of Falls Potential for falls Goals: Patient/caregiver will verbalize/demonstrate measures taken to prevent injury and/or falls Date Initiated: 06/13/2019 Target Resolution Date: 07/11/2019 Goal Status: Active Interventions: Assess fall risk on admission and as needed Assess impairment of mobility on admission and as needed per policy Notes: Venous Leg Ulcer Nursing Diagnoses: Actual venous Insuffiency (use after diagnosis is confirmed) Knowledge deficit related to disease process and management Goals: Patient will maintain optimal edema control Date Initiated: 06/13/2019 Target  Resolution Date: 07/11/2019 Goal Status: Active Interventions: Compression as ordered Provide education on venous insufficiency Treatment Activities: Therapeutic compression applied : 06/13/2019 Notes: Wound/Skin Impairment Nursing Diagnoses: Impaired tissue integrity Knowledge deficit related to ulceration/compromised skin integrity Goals: Patient/caregiver will verbalize understanding of skin care regimen Date Initiated: 06/13/2019 Target Resolution Date: 07/11/2019 Goal Status: Active Ulcer/skin breakdown will have a volume reduction of 30% by week 4 Date Initiated: 06/13/2019 Target Resolution Date: 07/11/2019 Goal Status: Active Interventions: Assess patient/caregiver ability to obtain necessary supplies Assess patient/caregiver ability to perform ulcer/skin care regimen upon admission and as needed Assess ulceration(s) every visit Treatment Activities: Skin care regimen initiated : 06/13/2019 Topical wound management initiated : 06/13/2019 Notes: Electronic Signature(s) Signed: 06/13/2019 5:10:27 PM By: [name] RN, BSN Entered By: [name] on 06/13/2019 14:24:25 -------------------------------------------------------------------------------- Pain Assessment Details Patient Name: Date of Service: Machai, Law 06/13/2019 1:15 PM Medical Record [id number] Patient Account Number: [id number] Date of Birth/Sex: Treating RN: 1955-09-20 (63 y.o. [name] Primary Care Christeena Krogh: [name] Other Clinician: Referring Remona Boom: Treating Meriel Kelliher/Extender:Robson, Donnamae Jude, August Albino in Treatment: 0 Active Problems Location of Pain Severity and Description of Pain Patient Has Paino No Site Locations Pain Management and Medication Current Pain Management: Electronic Signature(s) Signed: 06/16/2019 6:25:06 PM By: Levan Hurst RN, BSN Entered By: Levan Hurst on 06/13/2019  13:39:38 -------------------------------------------------------------------------------- Patient/Caregiver Education Details Patient Name: Date of Service: Zachery, Franklyn 9/25/2020andnbsp1:15 PM Medical Record [id number] Patient Account Number: [id number] Date of Birth/Gender: Treating RN: [birth year] (63 y.o. [name] Primary Care Physician: [name] Other Clinician: Referring Physician: Treating Physician/Extender:Robson, Donnamae Jude, August Albino in Treatment: 0 Education Assessment Education Provided To: Patient Education Topics Provided Safety: Methods: Explain/Verbal Responses: Reinforcements needed, State content correctly Venous: Methods: Explain/Verbal Responses: Reinforcements needed, State content correctly Electronic Signature(s) Signed: 06/13/2019 5:10:27 PM By: [name] RN, BSN Entered By: [name] on 06/13/2019 14:24:47 -------------------------------------------------------------------------------- Wound Assessment Details Patient Name: Date of Service: Donaldo, Stegman 06/13/2019 1:15 PM Medical Record [id number] Patient Account Number: [id number] Date of Birth/Sex: Treating RN: [birth year] (63 y.o. [name] Primary Care Melane Windholz: [name] Other Clinician: Referring Luisangel Wainright: Treating Kendallyn Lippold/Extender:Robson, Donnamae Jude, August Albino in Treatment: 0 Wound Status Wound Number: 61 Primary Etiology: Lymphedema Wound Location: Left Lower Leg - Circumferential Wound Status: Open Wounding Event: Gradually Appeared Comorbid History: Sleep Apnea, Hypertension Date Acquired: 04/19/2019 Weeks Of Treatment: 0 Clustered Wound: No Photos Wound Measurements Length: (cm) 21.5 % Reduct Width: (cm) 45 % Reduct Depth: (cm) 0.1 Epitheli Area: (cm) 759.873 Tunneli Volume: (cm) 75.987 Undermi Wound Description Full Thickness Without Exposed Support Foul Od Classification: Structures Slough/ Wound  Flat and Intact Margin: Exudate Large Amount: Exudate Serosanguineous Type: Exudate red, brown Color: Wound Bed Granulation Amount: Large (67-100%) Granulation Quality: Red Fascia E Necrotic Amount: None Present (0%) Fat Laye Tendon E Muscle E Joint Ex Bone Exp or After Cleansing: No Fibrino No Exposed Structure xposed: No r (Subcutaneous Tissue) Exposed: Yes xposed: No xposed: No posed: No  osed: No ion in Area: 0% ion in Volume: 0% alization: None ng: No ning: No Electronic Signature(s) Signed: 06/16/2019 6:25:06 PM By: Levan Hurst RN, BSN Signed: 06/18/2019 8:44:12 AM By: Mikeal Hawthorne EMT/HBOT Entered By: Mikeal Hawthorne on 06/16/2019 08:51:48 -------------------------------------------------------------------------------- Wound Assessment Details Patient Name: Date of Service: Markeis, Caylor 06/13/2019 1:15 PM Medical Record [id number] Patient Account Number: [id number] Date of Birth/Sex: Treating RN: 08/17/1956 (64 y.o. [name] Primary Care Shreyan Hinz: [name] Other Clinician: Referring Nuri Larmer: Treating Gabryela Kimbrell/Extender:Robson, Donnamae Jude, August Albino in Treatment: 0 Wound Status Wound Number: 62 Primary Etiology: Lymphedema Wound Location: Right Lower Leg - Circumferential Wound Status: Open Wounding Event: Gradually Appeared Comorbid History: Sleep Apnea, Hypertension Date Acquired: 04/19/2019 Weeks Of Treatment: 0 Clustered Wound: No Photos Wound Measurements Length: (cm) 23 % Reduct Width: (cm) 46 % Reduct Depth: (cm) 0.1 Epitheli Area: (cm) 830.951 Tunneli Volume: (cm) 83.095 Undermi Wound Description Classification: Full Thickness Without Exposed Support Foul Odo Structures Slough/F Wound Flat and Intact Margin: Exudate Large Amount: Exudate Serosanguineous Type: Exudate red, brown Color: Wound Bed Granulation Amount: Large (67-100%) Granulation Quality: Red Fascia E Necrotic Amount: None Present  (0%) Fat Laye Tendon E Muscle E Joint Ex Bone Exp r After Cleansing: No ibrino No Exposed Structure xposed: No r (Subcutaneous Tissue) Exposed: Yes xposed: No xposed: No posed: No osed: No ion in Area: 0% ion in Volume: 0% alization: None ng: No ning: No Electronic Signature(s) Signed: 06/16/2019 6:25:06 PM By: Levan Hurst RN, BSN Signed: 06/18/2019 8:44:12 AM By: Mikeal Hawthorne EMT/HBOT Entered By: Mikeal Hawthorne on 06/16/2019 08:53:42 -------------------------------------------------------------------------------- Vitals Details Patient Name: Date of Service: Tyquavion, Ciminelli 06/13/2019 1:15 PM Medical Record [id number] Patient Account Number: [id number] Date of Birth/Sex: Treating RN: [birth year] (63 y.o. [name] Primary Care Margee Trentham: [name] Other Clinician: Referring Lalia Loudon: Treating Camari Wisham/Extender:Robson, Donnamae Jude, August Albino in Treatment: 0 Vital Signs Time Taken: 13:08 Temperature (F): 98 Height (in): 70 Pulse (bpm): 70 Source: Stated Respiratory Rate (breaths/min): 22 Weight (lbs): 350 Blood Pressure (mmHg): 158/80 Source: Stated Reference Range: 80 - 120 mg / dl Body Mass Index (BMI): 50.2 Electronic Signature(s) Signed: 08/26/2019 3:04:24 PM By: Sandre Kitty Entered By: Sandre Kitty on 06/13/2019 13:08:50

## 2019-08-26 NOTE — Progress Notes (Signed)
[name] [name] ([id number]) Visit Report for 07/25/2019 Arrival Information Details Patient Name: Date of Service: [name] [name], [name] [name] 07/25/2019 1:45 PM Medical Record [id number] Patient Account Number: [id number] Date of Birth/Sex: Treating RN: 12-24-1955 (63 y.o. [name] Primary Care Kahle Mcqueen: [name] Other Clinician: Referring Glorine Hanratty: Treating Dorrian Doggett/Extender:Robson, Donnamae Jude, August Albino in Treatment: 6 Visit Information History Since Last Visit Cane Added or deleted any medications: No Patient Arrived: 14:30 Any new allergies or adverse reactions: No Arrival Time: Had a fall or experienced change in No Accompanied By: self None activities of daily living that may affect Transfer Assistance: risk of falls: Patient Identification Verified: Yes Signs or symptoms of abuse/neglect since last No Secondary Verification Process Completed: Yes visito Patient Requires Transmission-Based No Hospitalized since last visit: No Precautions: Implantable device outside of the clinic excluding No Patient Has Alerts: No cellular tissue based products placed in the center since last visit: Has Dressing in Place as Prescribed: Yes Pain Present Now: No Electronic Signature(s) Signed: 08/26/2019 3:00:22 PM By: Sandre Kitty Entered By: Sandre Kitty on 07/25/2019 14:33:52 -------------------------------------------------------------------------------- Compression Therapy Details Patient Name: Date of Service: [name] Igo. 07/25/2019 1:45 PM Medical Record [id number] Patient Account Number: [id number] Date of Birth/Sex: Treating RN: 1956-08-27 (63 y.o. [name] Primary Care Darcy Cordner: [name] Other Clinician: Referring Quintara Bost: Treating Katarina Riebe/Extender:Robson, Donnamae Jude, August Albino in Treatment: 6 Compression Therapy Performed for Wound Wound #61 Left,Circumferential Lower Leg Assessment: Performed By: Clinician  Kela Millin, RN Compression Type: Four Layer Post Procedure Diagnosis Same as Pre-procedure Electronic Signature(s) Signed: 07/25/2019 6:04:56 PM By: Kela Millin Entered By: Kela Millin on 07/25/2019 15:20:06 -------------------------------------------------------------------------------- Compression Therapy Details Patient Name: Date of Service: [name] [name], [name] [name] 07/25/2019 1:45 PM Medical Record [id number] Patient Account Number: [id number] Date of Birth/Sex: Treating RN: [birth year] (63 y.o. [name] Primary Care Jiovanny Burdell: [name] Other Clinician: Referring Genesis Novosad: Treating Yasmina Chico/Extender:Robson, Donnamae Jude, August Albino in Treatment: 6 Compression Therapy Performed for Wound Wound #62 Right,Circumferential Lower Leg Assessment: Performed By: Clinician Kela Millin, RN Compression Type: Four Layer Post Procedure Diagnosis Same as Pre-procedure Electronic Signature(s) Signed: 07/25/2019 6:04:56 PM By: Kela Millin Entered By: Kela Millin on 07/25/2019 15:20:06 -------------------------------------------------------------------------------- Compression Therapy Details Patient Name: Date of Service: [name] [name], [name] [name] 07/25/2019 1:45 PM Medical Record [id number] Patient Account Number: [id number] Date of Birth/Sex: Treating RN: 1956/03/05 (63 y.o. [name] Primary Care Manuella Blackson: [name] Other Clinician: Referring Hobart Marte: Treating Shawnique Mariotti/Extender:Robson, Donnamae Jude, August Albino in Treatment: 6 Compression Therapy Performed for Wound Wound #63 Left,Dorsal Foot Assessment: Performed By: Clinician Kela Millin, RN Compression Type: Four Layer Post Procedure Diagnosis Same as Pre-procedure Electronic Signature(s) Signed: 07/25/2019 6:04:56 PM By: Kela Millin Entered By: Kela Millin on 07/25/2019  15:20:07 -------------------------------------------------------------------------------- Compression Therapy Details Patient Name: Date of Service: [name] [name], [name] [name] 07/25/2019 1:45 PM Medical Record [id number] Patient Account Number: [id number] Date of Birth/Sex: Treating RN: 1956/09/03 (63 y.o. [name] Primary Care Afrika Brick: [name] Other Clinician: Referring Clifford Benninger: Treating Saraiyah Hemminger/Extender:Robson, Donnamae Jude, August Albino in Treatment: 6 Compression Therapy Performed for Wound Wound #64 Right,Dorsal Foot Assessment: Performed By: Clinician Kela Millin, RN Compression Type: Four Layer Post Procedure Diagnosis Same as Pre-procedure Electronic Signature(s) Signed: 07/25/2019 6:04:56 PM By: Kela Millin Entered By: Kela Millin on 07/25/2019 15:20:07 -------------------------------------------------------------------------------- Encounter Discharge Information Details Patient Name: Date of Service: [name] Igo. 07/25/2019 1:45 PM Medical Record [id number] Patient Account Number: [id number] Date of Birth/Sex: Treating RN: 1956/03/09 (63 y.o. Hessie Diener Primary Care Stephaine Breshears: [name]  Other Clinician: Referring Charlye Spare: Treating Leia Coletti/Extender:Robson, Donnamae Jude, August Albino in Treatment: 6 Encounter Discharge Information Items Discharge Condition: Stable Ambulatory Status: Cane Discharge Destination: Home Transportation: Private Auto Accompanied By: self Schedule Follow-up Appointment: Yes Clinical Summary of Care: Electronic Signature(s) Signed: 07/25/2019 5:26:58 PM By: Deon Pilling Entered By: Deon Pilling on 07/25/2019 16:14:46 -------------------------------------------------------------------------------- Lower Extremity Assessment Details Patient Name: Date of Service: [name] [name], [name] [name] 07/25/2019 1:45 PM Medical Record [id number] Patient Account Number: [id number] Date of Birth/Sex:  Treating RN: [birth year] (63 y.o. Hessie Diener Primary Care Alanda Colton: [name] Other Clinician: Referring Braelynn Benning: Treating Camber Ninh/Extender:Robson, Donnamae Jude, August Albino in Treatment: 6 Edema Assessment Assessed: [Left: Yes] [Right: Yes] Edema: [Left: Yes] [Right: Yes] Calf Left: Right: Point of Measurement: 38 cm From Medial Instep 42 cm 48.5 cm Ankle Left: Right: Point of Measurement: 10 cm From Medial Instep 28 cm 33 cm Electronic Signature(s) Signed: 07/25/2019 5:26:58 PM By: Deon Pilling Entered By: Deon Pilling on 07/25/2019 15:05:42 -------------------------------------------------------------------------------- Multi Wound Chart Details Patient Name: Date of Service: [name] Igo. 07/25/2019 1:45 PM Medical Record [id number] Patient Account Number: [id number] Date of Birth/Sex: Treating RN: [birth year] (63 y.o. [name] Primary Care Jasper Hanf: [name] Other Clinician: Referring Khiana Camino: Treating Keshun Berrett/Extender:Robson, Donnamae Jude, August Albino in Treatment: 6 Vital Signs Height(in): 70 Pulse(bpm): 65 Weight(lbs): 350 Blood Pressure(mmHg): 159/88 Body Mass Index(BMI): 50 Temperature(F): 97.9 Respiratory 24 Rate(breaths/min): Photos: [61:No Photos] [62:No Photos] [63:No Photos] Wound Location: [61:Left Lower Leg - Circumferential] [62:Right Lower Leg - Circumferential] [63:Left Foot - Dorsal] Wounding Event: [61:Gradually Appeared] [62:Gradually Appeared] [63:Gradually Appeared] Primary Etiology: [61:Lymphedema] [62:Lymphedema] [63:Lymphedema] Comorbid History: [61:Sleep Apnea, Hypertension Sleep Apnea, Hypertension Sleep Apnea, Hypertension] Date Acquired: [61:04/19/2019] [62:04/19/2019] [63:06/23/2019] Weeks of Treatment: [61:6] [62:6] [63:4] Wound Status: [61:Open] [62:Open] [63:Open] Measurements L x W x D 11.5x8x0.1 [62:7.5x5.5x0.1] [63:4x6x0.1] (cm) Area (cm) : [61:72.257] [62:32.398] [63:18.85] Volume (cm) :  [61:7.226] [62:3.24] [63:1.885] % Reduction in Area: [61:90.50%] [62:96.10%] [63:-9517.30%] % Reduction in Volume: 90.50% [62:96.10%] [63:-9325.00%] Classification: [61:Full Thickness Without Exposed Support Structures Exposed Support Structures Exposed Support Structures] [62:Full Thickness Without] [63:Full Thickness Without] Exudate Amount: [61:Medium] [62:Large] [63:Large] Exudate Type: [61:Serosanguineous] [62:Serosanguineous] [63:Serous] Exudate Color: [61:red, brown] [62:red, brown] [63:amber] Wound Margin: [61:Flat and Intact] [62:Flat and Intact] [63:Distinct, outline attached] Granulation Amount: [61:Large (67-100%)] [62:Large (67-100%)] [63:Large (67-100%)] Granulation Quality: [61:Red] [62:Red] [63:Red] Necrotic Amount: [61:None Present (0%)] [62:None Present (0%)] [63:None Present (0%)] Exposed Structures: [61:Fat Layer (Subcutaneous Fat Layer (Subcutaneous Fascia: No Tissue) Exposed: Yes Fascia: No Tendon: No Muscle: No Joint: No Bone: No] [62:Tissue) Exposed: Yes Fascia: No Tendon: No Muscle: No Joint: No Bone: No] [63:Fat Layer (Subcutaneous Tissue)  Exposed: No Tendon: No Muscle: No Joint: No Bone: No] Epithelialization: [61:Medium (34-66%)] [62:Medium (34-66%)] [63:Small (1-33%)] Procedures Performed: Compression Therapy [62:Compression Therapy 64 N/A] [63:Compression Therapy N/A] Photos: [61:No Photos] [62:N/A] [63:N/A] Wound Location: [61:Right Foot - Dorsal] [62:N/A] [63:N/A] Wounding Event: [61:Gradually Appeared] [62:N/A] [63:N/A] Primary Etiology: [61:Lymphedema] [62:N/A] [63:N/A] Comorbid History: [61:Sleep Apnea, Hypertension] [62:N/A] [63:N/A] Date Acquired: [61:07/25/2019] [62:N/A] [63:N/A] Weeks of Treatment: [61:0] [62:N/A] [63:N/A] Wound Status: [61:Open] [62:N/A] [63:N/A] Measurements L x W x D [61:0.7x1.7x0.1] [62:N/A] [63:N/A] (cm) Area (cm) : [61:0.935] [62:N/A] [63:N/A] Volume (cm) : [61:0.093] [62:N/A] [63:N/A] % Reduction in Area: [61:0.00%] [62:N/A]  [63:N/A] % Reduction in Volume: [61:0.00%] [62:N/A] [63:N/A] Classification: [61:Full Thickness Without Exposed Support Structures] [62:N/A] [63:N/A] Exudate Amount: [61:Medium] [62:N/A] [63:N/A] Exudate Type: [61:Serosanguineous] [62:N/A] [63:N/A] Exudate Color: [61:red, brown] [62:N/A] [63:N/A] Wound Margin: [61:Distinct, outline attached N/A] [63:N/A] Granulation Amount: [61:Large (67-100%)] [62:N/A] [  63:N/A] Granulation Quality: [61:Red, Pink] [62:N/A] [63:N/A] Necrotic Amount: [61:None Present (0%)] [62:N/A] [63:N/A] Exposed Structures: [61:Fascia: No Fat Layer (Subcutaneous Tissue) Exposed: No Tendon: No Muscle: No Joint: No Bone: No] [62:N/A] [63:N/A] Epithelialization: [61:Small (1-33%) Compression Therapy] [62:N/A N/A N/A N/A] Treatment Notes Wound #61 (Left, Circumferential Lower Leg) 1. Cleanse With Wound Cleanser Soap and water 2. Periwound Care Antifungal cream Barrier cream Moisturizing lotion TCA Cream 3. Primary Dressing Applied Calcium Alginate Ag 4. Secondary Dressing ABD Pad Dry Gauze Kerramax/Xtrasorb 6. Support Layer Applied 4 layer compression wrap Notes stocknette Wound #62 (Right, Circumferential Lower Leg) 1. Cleanse With Wound Cleanser Soap and water 2. Periwound Care Antifungal cream Barrier cream Moisturizing lotion TCA Cream 3. Primary Dressing Applied Calcium Alginate Ag 4. Secondary Dressing ABD Pad Dry Gauze Kerramax/Xtrasorb 6. Support Layer Applied 4 layer compression wrap Notes stocknette Wound #63 (Left, Dorsal Foot) 1. Cleanse With Wound Cleanser Soap and water 2. Periwound Care Antifungal cream Barrier cream Moisturizing lotion TCA Cream 3. Primary Dressing Applied Calcium Alginate Ag 4. Secondary Dressing ABD Pad Dry Gauze Kerramax/Xtrasorb 6. Support Layer Applied 4 layer compression wrap Notes stocknette Wound #64 (Right, Dorsal Foot) 1. Cleanse With Wound Cleanser Soap and water 2. Periwound  Care Antifungal cream Barrier cream Moisturizing lotion TCA Cream 3. Primary Dressing Applied Calcium Alginate Ag 4. Secondary Dressing ABD Pad Dry Gauze Kerramax/Xtrasorb 6. Support Layer Applied 4 layer compression wrap Notes Biomedical scientist) Signed: 07/27/2019 8:54:13 AM By: Linton Ham MD Signed: 07/29/2019 2:49:59 PM By: [name] RN, BSN Entered By: Linton Ham on 07/26/2019 08:38:04 -------------------------------------------------------------------------------- Multi-Disciplinary Care Plan Details Patient Name: Date of Service: [name] [name], [name] [name] 07/25/2019 1:45 PM Medical Record [id number] Patient Account Number: [id number] Date of Birth/Sex: Treating RN: Oct 12, 1955 (63 y.o. [name] Primary Care Estuardo Frisbee: [name] Other Clinician: Referring Linnie Mcglocklin: Treating Stormie Ventola/Extender:Robson, Donnamae Jude, August Albino in Treatment: 6 Active Inactive Venous Leg Ulcer Nursing Diagnoses: Actual venous Insuffiency (use after diagnosis is confirmed) Knowledge deficit related to disease process and management Goals: Patient will maintain optimal edema control Date Initiated: 06/13/2019 Target Resolution Date: 08/08/2019 Goal Status: Active Interventions: Compression as ordered Provide education on venous insufficiency Treatment Activities: Therapeutic compression applied : 06/13/2019 Notes: Wound/Skin Impairment Nursing Diagnoses: Impaired tissue integrity Knowledge deficit related to ulceration/compromised skin integrity Goals: Patient/caregiver will verbalize understanding of skin care regimen Date Initiated: 06/13/2019 Target Resolution Date: 08/08/2019 Goal Status: Active Ulcer/skin breakdown will have a volume reduction of 30% by week 4 Date Inactivated: 07/11/2019 Target Resolution Date Initiated: 06/13/2019 Date: 07/11/2019 Goal Status: Met Interventions: Assess patient/caregiver ability to obtain  necessary supplies Assess patient/caregiver ability to perform ulcer/skin care regimen upon admission and as needed Assess ulceration(s) every visit Treatment Activities: Skin care regimen initiated : 06/13/2019 Topical wound management initiated : 06/13/2019 Notes: Electronic Signature(s) Signed: 07/25/2019 6:04:56 PM By: Kela Millin Entered By: Kela Millin on 07/25/2019 15:20:38 -------------------------------------------------------------------------------- Pain Assessment Details Patient Name: Date of Service: [name] Igo. 07/25/2019 1:45 PM Medical Record [id number] Patient Account Number: [id number] Date of Birth/Sex: Treating RN: May 16, 1956 (63 y.o. [name] Primary Care Sharnelle Cappelli: [name] Other Clinician: Referring Jalisa Sacco: Treating Roopa Graver/Extender:Robson, Donnamae Jude, August Albino in Treatment: 6 Active Problems Location of Pain Severity and Description of Pain Patient Has Paino No Site Locations Pain Management and Medication Current Pain Management: Electronic Signature(s) Signed: 07/25/2019 6:12:06 PM By: [name] RN, BSN Signed: 08/26/2019 3:00:22 PM By: Sandre Kitty Entered By: Sandre Kitty on 07/25/2019 14:38:06 -------------------------------------------------------------------------------- Patient/Caregiver Education Details Patient Name: Date  of Service: [name] [name], [name] [name] 11/6/2020andnbsp1:45 PM Medical Record [id number] Patient Account Number: [id number] Date of Birth/Gender: Treating RN: [birth year] (63 y.o. [name] Primary Care Physician: [name] Other Clinician: Referring Physician: Treating Physician/Extender:Robson, Donnamae Jude, August Albino in Treatment: 6 Education Assessment Education Provided To: Patient Education Topics Provided Venous: Methods: Explain/Verbal Responses: State content correctly Electronic Signature(s) Signed: 07/25/2019 6:04:56 PM By: Kela Millin Entered By: Kela Millin on 07/25/2019 15:20:54 -------------------------------------------------------------------------------- Wound Assessment Details Patient Name: Date of Service: [name] [name], [name] [name] 07/25/2019 1:45 PM Medical Record [id number] Patient Account Number: [id number] Date of Birth/Sex: Treating RN: 1956-07-31 (63 y.o. [name] Primary Care Cheril Slattery: [name] Other Clinician: Referring Davion Meara: Treating Yarielis Funaro/Extender:Robson, Donnamae Jude, August Albino in Treatment: 6 Wound Status Wound Number: 61 Primary Etiology: Lymphedema Wound Location: Left Lower Leg - Circumferential Wound Status: Open Wounding Event: Gradually Appeared Comorbid History: Sleep Apnea, Hypertension Date Acquired: 04/19/2019 Weeks Of Treatment: 6 Clustered Wound: No Photos Wound Measurements Length: (cm) 11.5 % Reduct Width: (cm) 8 % Reduct Depth: (cm) 0.1 Epitheli Area: (cm) 72.257 Tunneli Volume: (cm) 7.226 Undermi Wound Description Classification: Full Thickness Without Exposed Support Foul Od Structures Slough/ Wound Flat and Intact Margin: Exudate Medium Amount: Exudate Serosanguineous Type: Exudate red, brown Color: Wound Bed Granulation Amount: Large (67-100%) Granulation Quality: Red Fascia E Necrotic Amount: None Present (0%) Fat Laye Tendon E Muscle E Joint Ex Bone Exp or After Cleansing: No Fibrino No Exposed Structure xposed: No r (Subcutaneous Tissue) Exposed: Yes xposed: No xposed: No posed: No osed: No ion in Area: 90.5% ion in Volume: 90.5% alization: Medium (34-66%) ng: No ning: No Electronic Signature(s) Signed: 07/29/2019 2:49:59 PM By: [name] RN, BSN Signed: 07/29/2019 4:05:15 PM By: Mikeal Hawthorne EMT/HBOT Previous Signature: 07/25/2019 5:26:58 PM Version By: Deon Pilling Previous Signature: 07/25/2019 6:12:06 PM Version By: [name] RN, BSN Entered By: Mikeal Hawthorne on 07/29/2019  08:48:04 -------------------------------------------------------------------------------- Wound Assessment Details Patient Name: Date of Service: [name] Igo. 07/25/2019 1:45 PM Medical Record [id number] Patient Account Number: [id number] Date of Birth/Sex: Treating RN: [birth year] (63 y.o. [name] Primary Care Venancio Chenier: [name] Other Clinician: Referring Coley Littles: Treating Lenzy Kerschner/Extender:Robson, Donnamae Jude, August Albino in Treatment: 6 Wound Status Wound Number: 62 Primary Etiology: Lymphedema Wound Location: Right Lower Leg - Circumferential Wound Status: Open Wounding Event: Gradually Appeared Comorbid History: Sleep Apnea, Hypertension Date Acquired: 04/19/2019 Weeks Of Treatment: 6 Clustered Wound: No Photos Wound Measurements Length: (cm) 7.5 % Reducti Width: (cm) 5.5 % Reducti Depth: (cm) 0.1 Epithelia Area: (cm) 32.398 Tunnelin Volume: (cm) 3.24 Undermin Wound Description Classification: Full Thickness Without Exposed Support Structures Wound Flat and Intact Margin: Exudate Large Amount: Exudate Serosanguineous Type: Exudate red, brown Color: Wound Bed Granulation Amount: Large (67-100%) Granulation Quality: Red Necrotic Amount: None Present (0%) Foul Odor After Cleansing: No Slough/Fibrino No Exposed Structure Fascia Exposed: No Fat Layer (Subcutaneous Tissue) Exposed: Yes Tendon Exposed: No Muscle Exposed: No Joint Exposed: No Bone Exposed: No on in Area: 96.1% on in Volume: 96.1% lization: Medium (34-66%) g: No ing: No Electronic Signature(s) Signed: 07/29/2019 2:49:59 PM By: [name] RN, BSN Signed: 07/29/2019 4:05:15 PM By: Mikeal Hawthorne EMT/HBOT Previous Signature: 07/25/2019 5:26:58 PM Version By: Deon Pilling Previous Signature: 07/25/2019 6:12:06 PM Version By: [name] RN, BSN Entered By: Mikeal Hawthorne on 07/29/2019  08:45:52 -------------------------------------------------------------------------------- Wound Assessment Details Patient Name: Date of Service: [name] Igo. 07/25/2019 1:45 PM Medical Record [id number] Patient Account Number: [id number] Date of Birth/Sex: Treating RN: July 17, 1956 (63 y.o.  [name] Primary Care Milas Schappell: [name] Other Clinician: Referring Michole Lecuyer: Treating Samuella Rasool/Extender:Robson, Donnamae Jude, August Albino in Treatment: 6 Wound Status Wound Number: 63 Primary Etiology: Lymphedema Wound Location: Left Foot - Dorsal Wound Status: Open Wounding Event: Gradually Appeared Comorbid History: Sleep Apnea, Hypertension Date Acquired: 06/23/2019 Weeks Of Treatment: 4 Clustered Wound: No Photos Wound Measurements Length: (cm) 4 % Reduct Width: (cm) 6 % Reduct Depth: (cm) 0.1 Epitheli Area: (cm) 18.85 Tunneli Volume: (cm) 1.885 Undermi Wound Description Classification: Full Thickness Without Exposed Support Foul Od Structures Slough/ Wound Distinct, outline attached Margin: Exudate Large Amount: Exudate Serous Type: Exudate amber Color: Wound Bed Granulation Amount: Large (67-100%) Granulation Quality: Red Fascia Necrotic Amount: None Present (0%) Fat Lay Tendon Muscle Joint E Bone Ex or After Cleansing: No Fibrino No Exposed Structure Exposed: No er (Subcutaneous Tissue) Exposed: No Exposed: No Exposed: No xposed: No posed: No ion in Area: -9517.3% ion in Volume: -9325% alization: Small (1-33%) ng: No ning: No Electronic Signature(s) Signed: 07/29/2019 2:49:59 PM By: [name] RN, BSN Signed: 07/29/2019 4:05:15 PM By: Mikeal Hawthorne EMT/HBOT Previous Signature: 07/25/2019 5:26:58 PM Version By: Deon Pilling Previous Signature: 07/25/2019 6:12:06 PM Version By: [name] RN, BSN Entered By: Mikeal Hawthorne on 07/29/2019  08:49:04 -------------------------------------------------------------------------------- Wound Assessment Details Patient Name: Date of Service: [name] Igo. 07/25/2019 1:45 PM Medical Record [id number] Patient Account Number: [id number] Date of Birth/Sex: Treating RN: [birth year] (63 y.o. [name] Primary Care Shaniyah Wix: [name] Other Clinician: Referring Abbygale Lapid: Treating Macario Shear/Extender:Robson, Donnamae Jude, August Albino in Treatment: 6 Wound Status Wound Number: 64 Primary Etiology: Lymphedema Wound Location: Right Foot - Dorsal Wound Status: Open Wounding Event: Gradually Appeared Comorbid History: Sleep Apnea, Hypertension Date Acquired: 07/25/2019 Weeks Of Treatment: 0 Clustered Wound: No Photos Wound Measurements Length: (cm) 0.7 % Reduct Width: (cm) 1.7 % Reduct Depth: (cm) 0.1 Epitheli Area: (cm) 0.935 Tunneli Volume: (cm) 0.093 Undermi Wound Description Classification: Full Thickness Without Exposed Support Foul Od Structures Slough/ Wound Distinct, outline attached Margin: Exudate Medium Amount: Exudate Serosanguineous Type: Exudate red, brown Color: Wound Bed Granulation Amount: Large (67-100%) Granulation Quality: Red, Pink Fascia E Necrotic Amount: None Present (0%) Fat Laye Tendon E Muscle E Joint Ex Bone Exp or After Cleansing: No Fibrino No Exposed Structure xposed: No r (Subcutaneous Tissue) Exposed: No xposed: No xposed: No posed: No osed: No ion in Area: 0% ion in Volume: 0% alization: Small (1-33%) ng: No ning: No Electronic Signature(s) Signed: 07/29/2019 2:49:59 PM By: [name] RN, BSN Signed: 07/29/2019 4:05:15 PM By: Mikeal Hawthorne EMT/HBOT Previous Signature: 07/25/2019 5:26:58 PM Version By: Deon Pilling Previous Signature: 07/25/2019 6:12:06 PM Version By: [name] RN, BSN Entered By: Mikeal Hawthorne on 07/29/2019  08:48:34 -------------------------------------------------------------------------------- Vitals Details Patient Name: Date of Service: [name] Igo. 07/25/2019 1:45 PM Medical Record [id number] Patient Account Number: [id number] Date of Birth/Sex: Treating RN: [birth year] (63 y.o. [name] Primary Care Princesa Willig: [name] Other Clinician: Referring Makailey Hodgkin: Treating Doak Mah/Extender:Robson, Donnamae Jude, August Albino in Treatment: 6 Vital Signs Time Taken: 14:37 Temperature (F): 97.9 Height (in): 70 Pulse (bpm): 53 Weight (lbs): 350 Respiratory Rate (breaths/min): 24 Body Mass Index (BMI): 50.2 Blood Pressure (mmHg): 159/88 Reference Range: 80 - 120 mg / dl Electronic Signature(s) Signed: 08/26/2019 3:00:22 PM By: Sandre Kitty Entered By: Sandre Kitty on 07/25/2019 14:38:00

## 2019-08-26 NOTE — Progress Notes (Signed)
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 Chemotherapy, Received Radiation Psychiatric Denies history of Anorexia/bulimia Hospitalization/Surgery History - kidney stones. - stroke. - stroke/TIA. Medical And Surgical History  Notes Constitutional Symptoms (General Health) h/o open leg wound , h/o CVA , TIA , varicose veins Respiratory asbestosis Cardiovascular chronic diastolic heart failure Genitourinary nephrolithiasis Review of Systems (ROS) Constitutional Symptoms (General Health) Denies complaints or symptoms of Fatigue, Fever, Chills, Marked Weight Change. Eyes Denies complaints or symptoms of Dry Eyes, Vision Changes, Glasses / Contacts. Ear/Nose/Mouth/Throat Denies complaints or symptoms of Chronic sinus problems or rhinitis. Respiratory Denies complaints or symptoms of Chronic or frequent coughs, Shortness of Breath. Cardiovascular Denies complaints or symptoms of Chest pain. Gastrointestinal Denies complaints or symptoms of Frequent diarrhea, Nausea, Vomiting. Endocrine Denies complaints or symptoms of Heat/cold intolerance. Genitourinary Denies complaints or symptoms of Frequent urination. Integumentary (Skin) Complains or has symptoms of Wounds. Musculoskeletal Denies complaints or symptoms of Muscle Pain, Muscle Weakness. Neurologic Denies complaints or symptoms of Numbness/parasthesias. Psychiatric Denies complaints or symptoms of Claustrophobia, Suicidal. Objective Constitutional Patient is hypertensive.. Pulse regular and within target range for patient.Marland Kitchen Respirations regular, non-labored and within target range.. Temperature is normal and within the target range for the patient.Marland Kitchen Appears in no distress. Vitals Time Taken: 1:08 PM, Height: 70 in, Source: Stated, Weight: 350 lbs, Source: Stated, BMI: 50.2, Temperature: 98 F, Pulse: 70 bpm, Respiratory Rate: 22 breaths/min, Blood Pressure: 158/80 mmHg. Eyes Conjunctivae clear. No discharge.no icterus. Respiratory work of breathing is normal. Cardiovascular Heart sounds are normal no evidence of CHF. Femoral pulses palpable. Pedal pulses pedal pulses are palpable. Lymphatic None palpable in the popliteal  area. Psychiatric appears at normal baseline. General Notes: Wound exam; his legs look about the same. Chronic venous insufficiency with large areas of chronic inflammation secondary to stasis dermatitis. He has almost circumferential loss of surface epithelium on the right and a large area on the left lateral extending into the posterior calf. 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 Stasis dermatitis. Wound #61 status is Open. Original cause of wound was Gradually Appeared. The wound is located on the Left,Circumferential Lower Leg. The wound measures 21.5cm length x 45cm width x 0.1cm depth; 759.873cm^2 area and 75.987cm^3 volume. There is Fat Layer (Subcutaneous Tissue) Exposed exposed. There is no tunneling or undermining noted. There is a large amount of serosanguineous drainage noted. The wound margin is flat and intact. There is large (67-100%) red granulation within the wound bed. There is  no necrotic tissue within the wound bed. Wound #62 status is Open. Original cause of wound was Gradually Appeared. The wound is located on the Right,Circumferential Lower Leg. The wound measures 23cm length x 46cm width x 0.1cm depth; 830.951cm^2 area and 83.095cm^3 volume. There is Fat Layer (Subcutaneous Tissue) Exposed exposed. There is no tunneling or undermining noted. There is a large amount of serosanguineous drainage noted. The wound margin is flat and intact. There is large (67-100%) red granulation within the wound bed. There is no necrotic tissue within the wound bed. 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 There was a Four Layer Compression Therapy Procedure by Kela Millin, RN. Post procedure Diagnosis Wound #61: Same as Pre-Procedure Wound #62 Pre-procedure diagnosis of Wound #62 is a Lymphedema located on the Right,Circumferential Lower Leg . There was a Four Layer Compression Therapy Procedure by Kela Millin, RN. Post procedure Diagnosis Wound #62: Same as Pre-Procedure Plan Follow-up Appointments: Return Appointment in 1 week. Dressing Change Frequency: Change dressing three times week. - Friday at wound center, Mon and Tues by home health Skin Barriers/Peri-Wound Care: Antifungal cream - mixed with zinc oxide cream and triamcinolone cream to excoriated skin Barrier cream TCA Cream or Ointment Wound Cleansing: May shower with protection. Primary Wound Dressing: Wound #61 Left,Circumferential Lower Leg: Calcium Alginate with Silver Wound #62 Right,Circumferential Lower Leg: Calcium Alginate with Silver Secondary Dressing: Wound #61 Left,Circumferential Lower Leg: ABD pad Kerramax - or zetuvit or equivalent super absorbant pad Wound #62 Right,Circumferential Lower Leg: ABD pad Kerramax - or zetuvit or equivalent super absorbant pad Edema Control: 4 layer compression - Bilateral Avoid standing for long periods of time Elevate legs to the level of the heart or above for 30 minutes daily and/or when sitting, a frequency of: - throughout the day Exercise regularly Segmental Compressive Device. - lymphadema pumps 60 minutes 2 times per day Home Health: [facility] for wound care. - [facility] to continue wound care The following medication(s) was prescribed: Lotrisone topical 1 %-0.05 % cream cream topical to affected areas with dressing changes starting 06/13/2019 1. We are going to use triamcinolone mixed with  antifungal cream to the large areas on his lower extremities 2. Little choice because of surface area other than to use silver alginate 3. 4 layer compression 4. I did have a conversation with him about his compression pumps but I am not optimistic that he will use these. 5. There is no evidence of cellulitis 6. No evidence of arterial insufficiency at the bedside. ABIs today were normal. Previous noninvasive studies were normal albeit done quite a few years ago Electronic Signature(s) Signed: 06/13/2019 2:42:28 PM By: Linton Ham MD Entered By: Linton Ham on 06/13/2019 14:42:28 -------------------------------------------------------------------------------- HxROS Details Patient Name: Date of Service: Khaleef, Standage 06/13/2019 1:15 PM Medical Record [id number] Patient Account Number: [id number] Date of Birth/Sex: Treating RN: 1956/09/09 (63 y.o. [name] Primary Care Provider: [name] Other Clinician: Referring Provider: Treating Provider/Extender:, Donnamae Jude, August Albino in Treatment: 0 Information Obtained From Patient Constitutional Symptoms (General Health) Complaints and Symptoms: Negative for: Fatigue; Fever; Chills; Marked Weight Change Medical History: Past Medical History Notes: h/o  open leg wound , h/o CVA , TIA , varicose veins Eyes Complaints and Symptoms: Negative for: Dry Eyes; Vision Changes; Glasses / Contacts Medical History: Negative for: Cataracts; Glaucoma; Optic Neuritis Ear/Nose/Mouth/Throat Complaints and Symptoms: Negative for: Chronic sinus problems or rhinitis Medical History: Negative for: Chronic sinus problems/congestion; Middle ear problems Respiratory Complaints and Symptoms: Negative for: Chronic or frequent coughs; Shortness of Breath Medical History: Positive for: Sleep Apnea - obstructive Negative for: Aspiration; Asthma; Chronic Obstructive Pulmonary Disease (COPD); Tuberculosis Past Medical  History Notes: asbestosis Cardiovascular Complaints and Symptoms: Negative for: Chest pain Medical History: Positive for: Hypertension Negative for: Angina; Arrhythmia; Congestive Heart Failure; Coronary Artery Disease; Deep Vein Thrombosis; Hypotension; Myocardial Infarction; Peripheral Arterial Disease; Peripheral Venous Disease; Phlebitis; Vasculitis Past Medical History Notes: chronic diastolic heart failure Gastrointestinal Complaints and Symptoms: Negative for: Frequent diarrhea; Nausea; Vomiting Medical History: Negative for: Cirrhosis ; Colitis; Crohns; Hepatitis A; Hepatitis B; Hepatitis C Endocrine Complaints and Symptoms: Negative for: Heat/cold intolerance Medical History: Negative for: Type I Diabetes; Type II Diabetes Genitourinary Complaints and Symptoms: Negative for: Frequent urination Medical History: Negative for: End Stage Renal Disease Past Medical History Notes: nephrolithiasis Integumentary (Skin) Complaints and Symptoms: Positive for: Wounds Medical History: Negative for: History of Burn Musculoskeletal Complaints and Symptoms: Negative for: Muscle Pain; Muscle Weakness Medical History: Negative for: Gout; Rheumatoid Arthritis; Osteoarthritis; Osteomyelitis Neurologic Complaints and Symptoms: Negative for: Numbness/parasthesias Medical History: Negative for: Dementia; Neuropathy; Quadriplegia; Paraplegia Psychiatric Complaints and Symptoms: Negative for: Claustrophobia; Suicidal Medical History: Negative for: Anorexia/bulimia Hematologic/Lymphatic Medical History: Negative for: Anemia; Hemophilia; Human Immunodeficiency Virus; Lymphedema Immunological Medical History: Negative for: Lupus Erythematosus; Raynauds; Scleroderma Oncologic Medical History: Negative for: Received Chemotherapy; Received Radiation Immunizations Pneumococcal Vaccine: Received Pneumococcal Vaccination: No Implantable Devices No devices added Hospitalization /  Surgery History Type of Hospitalization/Surgery kidney stones stroke stroke/TIA Family and Social History Cancer: Yes - Mother; Diabetes: Yes - Paternal Grandparents; Heart Disease: Yes - Siblings; Hypertension: Yes - Mother,Father,Siblings; Stroke: Yes - Paternal Grandparents; Never smoker; Marital Status - Married; Alcohol Use: Never; Drug Use: No History; Caffeine Use: Daily - SODA; Financial Concerns: No; Food, Clothing or Shelter Needs: No; Support System Lacking: No; Transportation Concerns: No Engineer, maintenance) Signed: 06/13/2019 4:52:57 PM By: Linton Ham MD Signed: 06/13/2019 5:10:27 PM By: [name] RN, BSN Signed: 08/26/2019 3:04:24 PM By: Sandre Kitty Entered By: Sandre Kitty on 06/13/2019 13:10:33 -------------------------------------------------------------------------------- SuperBill Details Patient Name: Date of Service: Gage, Yildiz 06/13/2019 Medical Record [id number] Patient Account Number: [id number] Date of Birth/Sex: Treating RN: Jan 18, 1956 (63 y.o. 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 Physician Procedures CPT4: Code [id number] Description: 214 - WC PHYS LEVEL 4 - EST PT ICD-10 Diagnosis  Description I87.333 Chronic venous hypertension (idiopathic) with ulcer and inf lower extremity L97.811 Non-pressure chronic ulcer of other part of right lower leg skin L97.121 Non-pressure  chronic ulcer of left thigh limited to breakdo I89.0 Lymphedema, not elsewhere classified  Modifier: lammation of bila limited to break wn of skin Quantity: 1 teral down of Electronic Signature(s) Signed: 06/13/2019 4:52:57 PM By: Linton Ham MD Entered By: Linton Ham on 06/13/2019 14:42:53

## 2019-08-26 NOTE — Progress Notes (Signed)
SYAIRE, ANGLADE ([id number]) Visit Report for 06/13/2019 Abuse/Suicide Risk Screen Details Patient Name: Date of Service: [name], [name] 06/13/2019 1:15 PM Medical Record [id number] Patient Account Number: [id number] Date of Birth/Sex: Treating RN: 10-22-1955 (63 y.o. [name] Primary Care Anshu Wehner: [name] Other Clinician: Referring Myrla Malanowski: Treating Dorcas Melito/Extender:Robson, Donnamae Jude, August Albino in Treatment: 0 Abuse/Suicide Risk Screen Items Answer ABUSE RISK SCREEN: Has anyone close to you tried to hurt or harm you recentlyo No Do you feel uncomfortable with anyone in your familyo No Has anyone forced you do things that you didnt want to doo No Electronic Signature(s) Signed: 06/13/2019 5:10:27 PM By: [name] RN, BSN Signed: 08/26/2019 3:04:24 PM By: Sandre Kitty Entered By: Sandre Kitty on 06/13/2019 13:10:43 -------------------------------------------------------------------------------- Activities of Daily Living Details Patient Name: Date of Service: [name], [name] 06/13/2019 1:15 PM Medical Record [id number] Patient Account Number: [id number] Date of Birth/Sex: Treating RN: 1956-06-08 (63 y.o. [name] Primary Care Khristine Verno: [name] Other Clinician: Referring Aracelie Addis: Treating Nahom Carfagno/Extender:Robson, Donnamae Jude, August Albino in Treatment: 0 Activities of Daily Living Items Answer Activities of Daily Living (Please select one for each item) Drive Automobile Completely Able Take Medications Completely Able Use Telephone Completely Able Care for Appearance Completely Able Use Toilet Completely Able Bath / Shower Completely Able Dress Self Completely Able Feed Self Completely Able Walk Completely Able Get In / Out Bed Completely Able Housework Completely Able Prepare Meals Completely Able Handle Money Completely Able Shop for Self Completely Able Electronic Signature(s) Signed:  06/13/2019 5:10:27 PM By: [name] RN, BSN Signed: 08/26/2019 3:04:24 PM By: Sandre Kitty Entered By: Sandre Kitty on 06/13/2019 13:11:31 -------------------------------------------------------------------------------- Education Screening Details Patient Name: Date of Service: [name], [name] 06/13/2019 1:15 PM Medical Record [id number] Patient Account Number: [id number] Date of Birth/Sex: Treating RN: 06/06/1956 (63 y.o. [name] Primary Care Kevan Prouty: [name] Other Clinician: Referring Jazzlene Huot: Treating Maison Agrusa/Extender:Robson, Donnamae Jude, August Albino in Treatment: 0 Primary Learner Assessed: Patient Learning Preferences/Education Level/Primary Language Learning Preference: Explanation Highest Education Level: College or Above Preferred Language: English Cognitive Barrier Language Barrier: No Translator Needed: No Memory Deficit: No Emotional Barrier: No Cultural/Religious Beliefs Affecting Medical Care: No Physical Barrier Impaired Vision: Yes Glasses Impaired Hearing: No Decreased Hand dexterity: No Knowledge/Comprehension Knowledge Level: High Comprehension Level: High Ability to understand written High instructions: Ability to understand verbal High instructions: Motivation Anxiety Level: Calm Cooperation: Cooperative Education Importance: Acknowledges Need Interest in Health Problems: Asks Questions Perception: Coherent Willingness to Engage in Self- High Management Activities: Readiness to Engage in Self- High Management Activities: Electronic Signature(s) Signed: 06/13/2019 5:10:27 PM By: [name] RN, BSN Signed: 08/26/2019 3:04:24 PM By: Sandre Kitty Entered By: Sandre Kitty on 06/13/2019 13:13:13 -------------------------------------------------------------------------------- Fall Risk Assessment Details Patient Name: Date of Service: [name], [name] 06/13/2019 1:15 PM Medical Record  [id number] Patient Account Number: [id number] Date of Birth/Sex: Treating RN: 1956-05-31 (63 y.o. [name] Primary Care Ragina Fenter: [name] Other Clinician: Referring Sione Baumgarten: Treating Takirah Binford/Extender:Robson, Donnamae Jude, August Albino in Treatment: 0 Fall Risk Assessment Items Have you had 2 or more falls in the last 12 monthso 0 No Have you had any fall that resulted in injury in the last 12 monthso 0 No FALLS RISK SCREEN History of falling - immediate or within 3 months 0 No Secondary diagnosis (Do you have 2 or more medical diagnoseso) 0 No Ambulatory aid None/bed rest/wheelchair/nurse 0 No Crutches/cane/walker 0 No Furniture 0 No Intravenous therapy Access/Saline/Heparin Lock 0 No Gait/Transferring Normal/ bed rest/  wheelchair 0 No Impaired (short steps with shuffle, may have difficulty arising from chair, 0 No head down, impaired balance) Mental Status Oriented to own ability 0 No Overestimates or forgets limitations 0 No Risk Level: Low Risk Score: 0 Electronic Signature(s) Signed: 06/13/2019 5:10:27 PM By: [name] RN, BSN Signed: 08/26/2019 3:04:24 PM By: Sandre Kitty Entered By: Sandre Kitty on 06/13/2019 13:13:27 -------------------------------------------------------------------------------- Foot Assessment Details Patient Name: Date of Service: [name], [name] 06/13/2019 1:15 PM Medical Record [id number] Patient Account Number: [id number] Date of Birth/Sex: Treating RN: [birth year] (63 y.o. [name] Primary Care Ahkeem Goede: [name] Other Clinician: Referring Keina Mutch: Treating Elana Jian/Extender:Robson, Donnamae Jude, August Albino in Treatment: 0 Foot Assessment Items Site Locations + = Sensation present, - = Sensation absent, C = Callus, U = Ulcer R = Redness, W = Warmth, M = Maceration, PU = Pre-ulcerative lesion F = Fissure, S = Swelling, D = Dryness Assessment Right: Left: Other Deformity: No  No Prior Foot Ulcer: No No Prior Amputation: No No Charcot Joint: No No Ambulatory Status: Ambulatory Without Help Gait: Steady Electronic Signature(s) Signed: 06/13/2019 5:10:27 PM By: [name] RN, BSN Signed: 08/26/2019 3:04:24 PM By: Sandre Kitty Entered By: Sandre Kitty on 06/13/2019 13:16:28 -------------------------------------------------------------------------------- Nutrition Risk Screening Details Patient Name: Date of Service: [name], [name] 06/13/2019 1:15 PM Medical Record [id number] Patient Account Number: [id number] Date of Birth/Sex: Treating RN: November 26, 1955 (63 y.o. [name] Primary Care Kinga Cassar: [name] Other Clinician: Referring Edwing Figley: Treating Ardelle Haliburton/Extender:Robson, Donnamae Jude, August Albino in Treatment: 0 Height (in): 70 Weight (lbs): 350 Body Mass Index (BMI): 50.2 Nutrition Risk Screening Items Score Screening NUTRITION RISK SCREEN: I have an illness or condition that made me change the kind and/or 0 No amount of food I eat I eat fewer than two meals per day 0 No I eat few fruits and vegetables, or milk products 0 No I have three or more drinks of beer, liquor or wine almost every day 0 No I have tooth or mouth problems that make it hard for me to eat 0 No I don't always have enough money to buy the food I need 0 No I eat alone most of the time 0 No I take three or more different prescribed or over-the-counter drugs a day 1 Yes 0 No Without wanting to, I have lost or gained 10 pounds in the last six months I am not always physically able to shop, cook and/or feed myself 2 Yes Nutrition Protocols Good Risk Protocol Provide education on Moderate Risk Protocol 0 nutrition High Risk Proctocol Risk Level: Moderate Risk Score: 3 Electronic Signature(s) Signed: 06/13/2019 5:10:27 PM By: [name] RN, BSN Signed: 08/26/2019 3:04:24 PM By: Sandre Kitty Entered By: Sandre Kitty on 06/13/2019  13:13:45

## 2019-08-27 NOTE — Progress Notes (Signed)
[name] [name], [name] [name] ([id number]) Visit Report for 08/22/2019 Arrival Information Details Patient Name: Date of Service: [name] [name], [name] [name] 08/22/2019 1:15 PM Medical Record [id number] Patient Account Number: [id number] Date of Birth/Sex: Treating RN: [birth year] (63 y.o. [name] [name], Meta.Reding Primary Care Catharine Kettlewell: [name] Other Clinician: Referring Page Pucciarelli: Treating Eiliana Drone/Extender:Robson, Donnamae Jude, August Albino in Treatment: 10 Visit Information History Since Last Visit Cane Added or deleted any medications: No Patient Arrived: 12:45 Any new allergies or adverse reactions: No Arrival Time: Had a fall or experienced change in No Accompanied By: self None activities of daily living that may affect Transfer Assistance: risk of falls: Patient Identification Verified: Yes Signs or symptoms of abuse/neglect since last No Secondary Verification Process Completed: Yes visito Patient Requires Transmission-Based No Hospitalized since last visit: No Precautions: Implantable device outside of the clinic excluding No Patient Has Alerts: No cellular tissue based products placed in the center since last visit: Has Dressing in Place as Prescribed: Yes Has Compression in Place as Prescribed: Yes Pain Present Now: No Electronic Signature(s) Signed: 08/22/2019 5:26:50 PM By: Deon Pilling Entered By: Deon Pilling on 08/22/2019 12:54:17 -------------------------------------------------------------------------------- Compression Therapy Details Patient Name: Date of Service: [name] [name], [name] [name] 08/22/2019 1:15 PM Medical Record [id number] Patient Account Number: [id number] Date of Birth/Sex: Treating RN: 08/23/1956 (63 y.o. [name] [name] Primary Care Arin Vanosdol: [name] Other Clinician: Referring Saachi Zale: Treating Inmer Nix/Extender:Robson, Donnamae Jude, August Albino in Treatment: 10 Compression Therapy Performed for Wound NonWound Condition Lymphedema - Bilateral  Leg Assessment: Performed By: Clinician Deon Pilling, RN Compression Type: Four Layer Post Procedure Diagnosis Same as Pre-procedure Electronic Signature(s) Signed: 08/27/2019 12:04:56 PM By: Kela Millin Entered By: Kela Millin on 08/22/2019 13:13:13 -------------------------------------------------------------------------------- Lower Extremity Assessment Details Patient Name: Date of Service: [name] [name] 08/22/2019 1:15 PM Medical Record [id number] Patient Account Number: [id number] Date of Birth/Sex: Treating RN: [birth year] (63 y.o. [name] [name] Primary Care Florene Brill: [name] Other Clinician: Referring Zellie Jenning: Treating Reilly Blades/Extender:Robson, Donnamae Jude, August Albino in Treatment: 10 Edema Assessment Assessed: [Left: Yes] [Right: No] Edema: [Left: Yes] [Right: Yes] Calf Left: Right: Point of Measurement: 38 cm From Medial Instep 36.5 cm 42 cm Ankle Left: Right: Point of Measurement: 10 cm From Medial Instep 26.5 cm 30 cm Electronic Signature(s) Signed: 08/22/2019 5:26:50 PM By: Deon Pilling Entered By: Deon Pilling on 08/22/2019 12:55:21 -------------------------------------------------------------------------------- Multi Wound Chart Details Patient Name: Date of Service: [name] Igo. 08/22/2019 1:15 PM Medical Record [id number] Patient Account Number: [id number] Date of Birth/Sex: Treating RN: [birth year] (63 y.o. [name] [name] Primary Care Kristle Wesch: [name] Other Clinician: Referring Cassell Voorhies: Treating Chandrea Zellman/Extender:Robson, Donnamae Jude, August Albino in Treatment: 10 Vital Signs Height(in): 70 Pulse(bpm): 53 Weight(lbs): 350 Blood Pressure(mmHg): 171/89 Body Mass Index(BMI): 50 Temperature(F): 98.3 Respiratory 22 Rate(breaths/min): Photos: [61:No Photos] [62:No Photos] [63:No Photos] Wound Location: [61:Left, Circumferential Lower Right, Circumferential Lower Left, Dorsal Foot Leg]  [62:Leg] Wounding Event: [61:Gradually Appeared] [62:Gradually Appeared] [63:Gradually Appeared] Primary Etiology: [61:Lymphedema] [62:Lymphedema] [63:Lymphedema] Date Acquired: [61:04/19/2019] [62:04/19/2019] [63:06/23/2019] Weeks of Treatment: [61:10] [62:10] [63:8] Wound Status: [61:Healed - Epithelialized] [62:Healed - Epithelialized] [63:Healed - Epithelialized] Measurements L x W x D 0x0x0 [62:0x0x0] [63:0x0x0] (cm) Area (cm) : [61:0] [62:0] [63:0] Volume (cm) : [61:0] [62:0] [63:0] % Reduction in Area: [61:100.00%] [62:100.00%] [63:100.00%] % Reduction in Volume: 100.00% [62:100.00%] [63:100.00%] Classification: [61:Full Thickness Without Exposed Support Structures Exposed Support Structures Exposed Support Structures] [62:Full Thickness Without 64] [63:Full Thickness Without N/A N/A] Photos: [62:No Photos N/A] [63:N/A] Wound Location: [61:Right, Dorsal Foot] [62:N/A] [63:N/A] Wounding Event: [61:Gradually Appeared] [  62:N/A] [63:N/A] Primary Etiology: [61:Lymphedema] [62:N/A] [63:N/A] Date Acquired: [61:07/25/2019] [62:N/A] [63:N/A] Weeks of Treatment: [61:4] [62:N/A] [63:N/A] Wound Status: [61:Healed - Epithelialized] [62:N/A] [63:N/A] Measurements L x W x D [61:0x0x0] [62:N/A] [63:N/A] (cm) Area (cm) : [61:0] [62:N/A] [63:N/A] Volume (cm) : [61:0] [62:N/A] [63:N/A] % Reduction in Area: [61:100.00%] [62:N/A] [63:N/A] % Reduction in Volume: [61:100.00%] [62:N/A] [63:N/A] Classification: [61:Full Thickness Without Exposed Support Structures] [62:N/A] [63:N/A] Treatment Notes Electronic Signature(s) Signed: 08/22/2019 6:02:04 PM By: Linton Ham MD Signed: 08/27/2019 12:04:56 PM By: Kela Millin Entered By: Linton Ham on 08/22/2019 13:17:41 -------------------------------------------------------------------------------- Multi-Disciplinary Care Plan Details Patient Name: Date of Service: [name] Igo. 08/22/2019 1:15 PM Medical Record [id number] Patient  Account Number: [id number] Date of Birth/Sex: Treating RN: 24-May-1956 (63 y.o. [name] [name] Primary Care Jizel Cheeks: Other Clinician: [name] Referring Johathan Province: Treating Ethelean Colla/Extender:Robson, Donnamae Jude, August Albino in Treatment: 10 Active Inactive Electronic Signature(s) Signed: 08/27/2019 12:04:56 PM By: Kela Millin Entered By: Kela Millin on 08/22/2019 13:11:58 -------------------------------------------------------------------------------- Pain Assessment Details Patient Name: Date of Service: [name] [name], [name] [name] 08/22/2019 1:15 PM Medical Record [id number] Patient Account Number: [id number] Date of Birth/Sex: Treating RN: 08/02/1956 (63 y.o. [name] [name] Primary Care Kattleya Kuhnert: [name] Other Clinician: Referring Rodderick Holtzer: Treating Kawika Bischoff/Extender:Robson, Donnamae Jude, August Albino in Treatment: 10 Active Problems Location of Pain Severity and Description of Pain Patient Has Paino No Site Locations Rate the pain. Current Pain Level: 0 Pain Management and Medication Current Pain Management: Medication: No Cold Application: No Rest: No Massage: No Activity: No T.E.N.S.: No Heat Application: No Leg drop or elevation: No Is the Current Pain Management Adequate: Adequate How does your wound impact your activities of daily livingo Sleep: No Bathing: No Appetite: No Relationship With Others: No Bladder Continence: No Emotions: No Bowel Continence: No Work: No Toileting: No Drive: No Dressing: No Hobbies: No Electronic Signature(s) Signed: 08/22/2019 5:26:50 PM By: Deon Pilling Entered By: Deon Pilling on 08/22/2019 12:54:58 -------------------------------------------------------------------------------- Patient/Caregiver Education Details Patient Name: Date of Service: [name] Igo 12/4/2020andnbsp1:15 PM Medical Record [id number] Patient Account Number: [id number] Date of Birth/Gender: Treating  RN: 09/02/1956 (63 y.o. [name] [name] Primary Care Physician: [name] Other Clinician: Referring Physician: Treating Physician/Extender:Robson, Donnamae Jude, August Albino in Treatment: 10 Education Assessment Education Provided To: Patient Education Topics Provided Venous: Methods: Explain/Verbal Responses: State content correctly Electronic Signature(s) Signed: 08/27/2019 12:04:56 PM By: Kela Millin Entered By: Kela Millin on 08/22/2019 12:49:36 -------------------------------------------------------------------------------- Wound Assessment Details Patient Name: Date of Service: [name] [name], [name] [name] 08/22/2019 1:15 PM Medical Record [id number] Patient Account Number: [id number] Date of Birth/Sex: Treating RN: 28-Sep-1955 (63 y.o. [name] [name] Primary Care Natassja Ollis: [name] Other Clinician: Referring Jr Milliron: Treating Emmarie Sannes/Extender:Robson, Donnamae Jude, August Albino in Treatment: 10 Wound Status Wound Number: [id number] Primary Etiology: Lymphedema Wound Location: Left Lower Leg - Circumferential Wound Status: Healed - Epithelialized Wounding Event: Gradually Appeared Comorbid History: Sleep Apnea, Hypertension Date Acquired: 04/19/2019 Weeks Of Treatment: 10 Clustered Wound: No Photos Wound Measurements Length: (cm) 0 % Reduct Width: (cm) 0 % Reduct Depth: (cm) 0 Epitheli Area: (cm) 0 Volume: (cm) 0 Wound Description Full Thickness Without Exposed Support Foul Od Classification: Structures Slough/ Wound Flat and Intact Margin: Exudate Medium Amount: Exudate Serosanguineous Type: Exudate red, brown Color: Wound Bed Granulation Amount: Large (67-100%) Granulation Quality: Red Fascia Necrotic Amount: None Present (0%) Fat Lay Tendon Muscle Joint E Bone Ex or After Cleansing: No Fibrino No Exposed Structure Exposed: No er (Subcutaneous Tissue) Exposed: Yes Exposed: No Exposed: No xposed: No posed: No ion  in Area:  100% ion in Volume: 100% alization: Medium (34-66%) Electronic Signature(s) Signed: 08/25/2019 5:20:01 PM By: Deon Pilling Signed: 08/26/2019 4:34:42 PM By: Mikeal Hawthorne EMT/HBOT Previous Signature: 08/22/2019 5:26:50 PM Version By: Deon Pilling Entered By: Mikeal Hawthorne on 08/25/2019 11:41:33 -------------------------------------------------------------------------------- Wound Assessment Details Patient Name: Date of Service: [name] [name], [name] [name] 08/22/2019 1:15 PM Medical Record [id number] Patient Account Number: [id number] Date of Birth/Sex: Treating RN: 1955-12-27 (63 y.o. [name] [name] Primary Care Gifford Ballon: [name] Other Clinician: Referring Renly Guedes: Treating Jazlyne Gauger/Extender:Robson, Donnamae Jude, August Albino in Treatment: 10 Wound Status Wound Number: [id number] Primary Etiology: Lymphedema Wound Location: Right Lower Leg - Circumferential Wound Status: Healed - Epithelialized Wounding Event: Gradually Appeared Comorbid History: Sleep Apnea, Hypertension Date Acquired: 04/19/2019 Weeks Of Treatment: 10 Clustered Wound: No Photos Wound Measurements Length: (cm) 0 % Reduct Width: (cm) 0 % Reduct Depth: (cm) 0 Epitheli Area: (cm) 0 Volume: (cm) 0 Wound Description Classification: Full Thickness Without Exposed Support Foul Od Structures Slough/ Wound Flat and Intact Margin: Exudate Large Amount: Exudate Serosanguineous Type: Exudate red, brown Color: Wound Bed Granulation Amount: Large (67-100%) Granulation Quality: Red Fascia Necrotic Amount: None Present (0%) Fat Lay Tendon Muscle Joint E Bone Ex or After Cleansing: No Fibrino No Exposed Structure Exposed: No er (Subcutaneous Tissue) Exposed: Yes Exposed: No Exposed: No xposed: No posed: No ion in Area: 100% ion in Volume: 100% alization: Medium (34-66%) Electronic Signature(s) Signed: 08/25/2019 5:20:01 PM By: Deon Pilling Signed: 08/26/2019 4:34:42 PM By: Mikeal Hawthorne  EMT/HBOT Previous Signature: 08/22/2019 5:26:50 PM Version By: Deon Pilling Entered By: Mikeal Hawthorne on 08/25/2019 12:00:46 -------------------------------------------------------------------------------- Wound Assessment Details Patient Name: Date of Service: [name] Igo. 08/22/2019 1:15 PM Medical Record [id number] Patient Account Number: [id number] Date of Birth/Sex: Treating RN: Jan 20, 1956 (63 y.o. [name] [name] Primary Care Zariel Capano: [name] Other Clinician: Referring Satin Boal: Treating Dartanian Knaggs/Extender:Robson, Donnamae Jude, August Albino in Treatment: 10 Wound Status Wound Number: 63 Primary Etiology: Lymphedema Wound Location: Left Foot - Dorsal Wound Status: Healed - Epithelialized Wounding Event: Gradually Appeared Comorbid History: Sleep Apnea, Hypertension Date Acquired: 06/23/2019 Weeks Of Treatment: 8 Clustered Wound: No Photos Wound Measurements Length: (cm) 0 % Reduct Width: (cm) 0 % Reduct Depth: (cm) 0 Epitheli Area: (cm) 0 Volume: (cm) 0 Wound Description Classification: Full Thickness Without Exposed Support Structures Wound Distinct, outline attached Margin: Exudate Large Amount: Exudate Serous Type: Exudate amber Color: Wound Bed Granulation Amount: Large (67-100%) Granulation Quality: Red Necrotic Amount: None Present (0%) Foul Odor After Cleansing: No Slough/Fibrino No Exposed Structure Fascia Exposed: No Fat Layer (Subcutaneous Tissue) Exposed: No Tendon Exposed: No Muscle Exposed: No Joint Exposed: No Bone Exposed: No ion in Area: 100% ion in Volume: 100% alization: Small (1-33%) Electronic Signature(s) Signed: 08/25/2019 5:20:01 PM By: Deon Pilling Signed: 08/26/2019 4:34:42 PM By: Mikeal Hawthorne EMT/HBOT Previous Signature: 08/22/2019 5:26:50 PM Version By: Deon Pilling Entered By: Mikeal Hawthorne on 08/25/2019 12:01:29 -------------------------------------------------------------------------------- Wound  Assessment Details Patient Name: Date of Service: [name] Igo. 08/22/2019 1:15 PM Medical Record [id number] Patient Account Number: [id number] Date of Birth/Sex: Treating RN: 1956-05-25 (63 y.o. [name] [name] Primary Care Gabryela Kimbrell: [name] Other Clinician: Referring Milea Klink: Treating Latajah Thuman/Extender:Robson, Donnamae Jude, August Albino in Treatment: 10 Wound Status Wound Number: [id number] Primary Etiology: Lymphedema Wound Location: Right Foot - Dorsal Wound Status: Healed - Epithelialized Wounding Event: Gradually Appeared Comorbid History: Sleep Apnea, Hypertension Date Acquired: 07/25/2019 Weeks Of Treatment: 4 Clustered Wound: No Photos Wound Measurements Length: (cm) 0 % Reduct Width: (cm) 0 %  Reduct Depth: (cm) 0 Epitheli Area: (cm) 0 Volume: (cm) 0 Wound Description Classification: Full Thickness Without Exposed Support Foul Od Structures Slough/ Wound Distinct, outline attached Margin: Exudate Medium Amount: Exudate Serosanguineous Type: Exudate red, brown Color: Wound Bed Granulation Amount: Large (67-100%) Granulation Quality: Red, Pink Fascia Ex Necrotic Amount: None Present (0%) Fat Layer Tendon Ex Muscle Ex Joint Exp Bone Expo or After Cleansing: No Fibrino No Exposed Structure posed: No (Subcutaneous Tissue) Exposed: No posed: No posed: No osed: No sed: No ion in Area: 100% ion in Volume: 100% alization: Small (1-33%) Electronic Signature(s) Signed: 08/25/2019 5:20:01 PM By: Deon Pilling Signed: 08/26/2019 4:34:42 PM By: Mikeal Hawthorne EMT/HBOT Previous Signature: 08/22/2019 5:26:50 PM Version By: Deon Pilling Entered By: Mikeal Hawthorne on 08/25/2019 12:02:09 -------------------------------------------------------------------------------- Vitals Details Patient Name: Date of Service: [name] Igo. 08/22/2019 1:15 PM Medical Record [id number] Patient Account Number: [id number] Date of Birth/Sex: Treating  RN: [birth year] (63 y.o. [name] [name] Primary Care Jariyah Hackley: [name] Other Clinician: Referring Junice Fei: Treating Casara Perrier/Extender:Robson, Donnamae Jude, August Albino in Treatment: 10 Vital Signs Time Taken: 12:45 Temperature (F): 98.3 Height (in): 70 Pulse (bpm): 56 Weight (lbs): 350 Respiratory Rate (breaths/min): 22 Body Mass Index (BMI): 50.2 Blood Pressure (mmHg): 171/89 Reference Range: 80 - 120 mg / dl Electronic Signature(s) Signed: 08/22/2019 5:26:50 PM By: Deon Pilling Entered By: Deon Pilling on 08/22/2019 12:54:49

## 2019-08-27 NOTE — Progress Notes (Signed)
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 the patient.Marland Kitchen Appears in no distress. Vitals Time Taken: 12:45 PM, Height: 70 in, Weight: 350 lbs, BMI: 50.2, Temperature: 98.3 F, Pulse: 56 bpm, Respiratory Rate: 22 breaths/min, Blood Pressure: 171/89 mmHg. Eyes Conjunctivae clear. No discharge.no icterus. Cardiovascular Pedal pulses are  palpable. Edema control is really very good. Lymphatic None palpable in the popliteal area. Psychiatric appears at normal baseline. General Notes: Exam; there is no open wound on either lower leg. Everything is fully epithelialized. 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 His edema is very well controlled. No erythema no evidence of infection. Wound #61 status is Healed - Epithelialized. Original cause of wound was Gradually Appeared. The wound is located on the Left,Circumferential Lower Leg. The wound measures 0cm length x 0cm width x 0cm depth; 0cm^2 area and 0cm^3 volume. Wound #62 status is Healed - Epithelialized. Original cause of wound was Gradually Appeared. The wound is located on the Right,Circumferential Lower Leg. The wound measures 0cm length x 0cm width x 0cm depth; 0cm^2 area and 0cm^3 volume. Wound #63 status is Healed - Epithelialized. Original cause of wound was Gradually Appeared. The wound is located on the Left,Dorsal Foot. The wound measures 0cm length x 0cm width x 0cm depth; 0cm^2 area and 0cm^3 volume. Wound #64 status is Healed - Epithelialized. Original cause of wound was Gradually Appeared. The wound is located on the Right,Dorsal Foot. The wound measures 0cm length x 0cm width x 0cm depth; 0cm^2 area and 0cm^3 volume. 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 Post procedure Diagnosis Wound #: Same as  Pre-Procedure Plan Discharge From [facility]: Discharge from [facility] - call if wound re-opens Skin Barriers/Peri-Wound Care: Moisturizing lotion - moisturize at night before bed Edema Control: 4 layer compression - Bilateral Avoid standing for long periods of time Elevate legs to the level of the heart or above for 30 minutes daily and/or when sitting, a frequency of: - throughout the day Exercise regularly Segmental Compressive Device. - lymphadema pumps 60 minutes 2 times per day Other: - Wear compression stockings to legs bilaterally. Place on first thing in the morning and remove before bed Home Health: [facility] 1. I put him in bilateral 4-layer compression until he can get home and replace this with his external compression garments 2. I have emphasized forcefully the need to use his compression pumps at least once a day but more likely twice a day or else this will open in short order. 3. His wife is in a nursing home in [place] after breaking her arm. He is I think going to visit her there Electronic Signature(s) Signed: 08/22/2019 6:02:04 PM By: Linton Ham MD Entered By: Linton Ham on 08/22/2019 13:20:43 -------------------------------------------------------------------------------- SuperBill Details Patient Name: Date of Service: [name] [name], [name] [name] 08/22/2019 Medical Record [id number] Patient Account Number: [id number] Date of Birth/Sex: Treating RN: Oct 05, 1955 (63 y.o. [name] [name] Primary Care Provider: [name] Other Clinician: Referring Provider: Treating Provider/Extender:Robson, Donnamae Jude, August Albino in Treatment: 10 Diagnosis Coding ICD-10 Codes Code Description [phone] Chronic venous hypertension (idiopathic) with ulcer and inflammation of bilateral lower extremity L97.811 Non-pressure chronic ulcer of other part of right lower leg limited to breakdown of skin L97.821 Non-pressure chronic ulcer of  other part of left lower leg limited to breakdown of skin I89.0 Lymphedema, not elsewhere classified L97.521 Non-pressure chronic ulcer of other part of left foot limited to breakdown of skin Facility Procedures CPT4: Description Modifier Quantity Code [id number] BILATERAL: Application of multi-layer venous compression system; leg 1 (below knee), including ankle and foot. Physician Procedures CPT4: Code [id number] Description: [id number] - WC PHYS LEVEL 3 - EST PT ICD-10 Diagnosis Description I87.333 Chronic venous hypertension (idiopathic) with ulcer and inflamm lower extremity L97.811 Non-pressure chronic ulcer of other part of right lower leg lim skin L97.821  Non-pressure chronic ulcer of other part of left lower leg limi skin Modifier: ation of bilatera ited to breakdown ted to breakdown Quantity: 1 l of of Electronic Signature(s) Signed: 08/22/2019 6:02:04 PM By: Linton Ham MD Entered By: Linton Ham on 08/22/2019 13:21:02

## 2019-09-05 DIAGNOSIS — R0602 Shortness of breath: Secondary | ICD-10-CM | POA: Diagnosis not present

## 2019-09-05 DIAGNOSIS — J069 Acute upper respiratory infection, unspecified: Secondary | ICD-10-CM | POA: Diagnosis not present

## 2019-09-05 DIAGNOSIS — U071 COVID-19: Secondary | ICD-10-CM | POA: Diagnosis not present

## 2019-09-09 ENCOUNTER — Inpatient Hospital Stay (HOSPITAL_COMMUNITY)
Admission: EM | Admit: 2019-09-09 | Discharge: 2019-09-17 | DRG: 177 | Disposition: A | Discharge status: Home Health | Payer: Medicare Other | Attending: Internal Medicine | Admitting: Internal Medicine

## 2019-09-09 ENCOUNTER — Emergency Department (HOSPITAL_COMMUNITY): Payer: Medicare Other

## 2019-09-09 ENCOUNTER — Other Ambulatory Visit: Payer: Self-pay

## 2019-09-09 ENCOUNTER — Encounter (HOSPITAL_COMMUNITY): Payer: Self-pay | Admitting: Emergency Medicine

## 2019-09-09 DIAGNOSIS — Z7902 Long term (current) use of antithrombotics/antiplatelets: Secondary | ICD-10-CM

## 2019-09-09 DIAGNOSIS — J1289 Other viral pneumonia: Secondary | ICD-10-CM | POA: Diagnosis present

## 2019-09-09 DIAGNOSIS — Z6841 Body Mass Index (BMI) 40.0 and over, adult: Secondary | ICD-10-CM | POA: Diagnosis not present

## 2019-09-09 DIAGNOSIS — L03119 Cellulitis of unspecified part of limb: Secondary | ICD-10-CM | POA: Diagnosis present

## 2019-09-09 DIAGNOSIS — Z79899 Other long term (current) drug therapy: Secondary | ICD-10-CM

## 2019-09-09 DIAGNOSIS — I5032 Chronic diastolic (congestive) heart failure: Secondary | ICD-10-CM | POA: Diagnosis present

## 2019-09-09 DIAGNOSIS — Z7982 Long term (current) use of aspirin: Secondary | ICD-10-CM

## 2019-09-09 DIAGNOSIS — Z8673 Personal history of transient ischemic attack (TIA), and cerebral infarction without residual deficits: Secondary | ICD-10-CM

## 2019-09-09 DIAGNOSIS — I959 Hypotension, unspecified: Secondary | ICD-10-CM | POA: Diagnosis not present

## 2019-09-09 DIAGNOSIS — J9601 Acute respiratory failure with hypoxia: Secondary | ICD-10-CM | POA: Diagnosis present

## 2019-09-09 DIAGNOSIS — R269 Unspecified abnormalities of gait and mobility: Secondary | ICD-10-CM | POA: Diagnosis present

## 2019-09-09 DIAGNOSIS — Z823 Family history of stroke: Secondary | ICD-10-CM

## 2019-09-09 DIAGNOSIS — J61 Pneumoconiosis due to asbestos and other mineral fibers: Secondary | ICD-10-CM | POA: Diagnosis present

## 2019-09-09 DIAGNOSIS — R0902 Hypoxemia: Secondary | ICD-10-CM | POA: Diagnosis not present

## 2019-09-09 DIAGNOSIS — I110 Hypertensive heart disease with heart failure: Secondary | ICD-10-CM | POA: Diagnosis present

## 2019-09-09 DIAGNOSIS — I493 Ventricular premature depolarization: Secondary | ICD-10-CM | POA: Diagnosis present

## 2019-09-09 DIAGNOSIS — I10 Essential (primary) hypertension: Secondary | ICD-10-CM | POA: Diagnosis not present

## 2019-09-09 DIAGNOSIS — Z87442 Personal history of urinary calculi: Secondary | ICD-10-CM | POA: Diagnosis not present

## 2019-09-09 DIAGNOSIS — L97909 Non-pressure chronic ulcer of unspecified part of unspecified lower leg with unspecified severity: Secondary | ICD-10-CM | POA: Diagnosis present

## 2019-09-09 DIAGNOSIS — I890 Lymphedema, not elsewhere classified: Secondary | ICD-10-CM | POA: Diagnosis present

## 2019-09-09 DIAGNOSIS — Z743 Need for continuous supervision: Secondary | ICD-10-CM | POA: Diagnosis not present

## 2019-09-09 DIAGNOSIS — J189 Pneumonia, unspecified organism: Secondary | ICD-10-CM

## 2019-09-09 DIAGNOSIS — Z8249 Family history of ischemic heart disease and other diseases of the circulatory system: Secondary | ICD-10-CM

## 2019-09-09 DIAGNOSIS — I83009 Varicose veins of unspecified lower extremity with ulcer of unspecified site: Secondary | ICD-10-CM | POA: Diagnosis not present

## 2019-09-09 DIAGNOSIS — R05 Cough: Secondary | ICD-10-CM | POA: Diagnosis not present

## 2019-09-09 DIAGNOSIS — E662 Morbid (severe) obesity with alveolar hypoventilation: Secondary | ICD-10-CM | POA: Diagnosis present

## 2019-09-09 DIAGNOSIS — R5381 Other malaise: Secondary | ICD-10-CM | POA: Diagnosis not present

## 2019-09-09 DIAGNOSIS — M2550 Pain in unspecified joint: Secondary | ICD-10-CM | POA: Diagnosis not present

## 2019-09-09 DIAGNOSIS — I878 Other specified disorders of veins: Secondary | ICD-10-CM | POA: Diagnosis present

## 2019-09-09 DIAGNOSIS — J1282 Pneumonia due to coronavirus disease 2019: Secondary | ICD-10-CM

## 2019-09-09 DIAGNOSIS — R112 Nausea with vomiting, unspecified: Secondary | ICD-10-CM | POA: Diagnosis present

## 2019-09-09 DIAGNOSIS — R0602 Shortness of breath: Secondary | ICD-10-CM | POA: Diagnosis not present

## 2019-09-09 DIAGNOSIS — E876 Hypokalemia: Secondary | ICD-10-CM | POA: Diagnosis present

## 2019-09-09 DIAGNOSIS — U071 COVID-19: Secondary | ICD-10-CM | POA: Diagnosis present

## 2019-09-09 DIAGNOSIS — R069 Unspecified abnormalities of breathing: Secondary | ICD-10-CM | POA: Diagnosis not present

## 2019-09-09 DIAGNOSIS — Z7401 Bed confinement status: Secondary | ICD-10-CM | POA: Diagnosis not present

## 2019-09-09 DIAGNOSIS — R509 Fever, unspecified: Secondary | ICD-10-CM | POA: Diagnosis not present

## 2019-09-09 LAB — COMPREHENSIVE METABOLIC PANEL
ALT: 19 U/L (ref 0–44)
AST: 16 U/L (ref 15–41)
Albumin: 3.3 g/dL — ABNORMAL LOW (ref 3.5–5.0)
Alkaline Phosphatase: 53 U/L (ref 38–126)
Anion gap: 11 (ref 5–15)
BUN: 23 mg/dL (ref 8–23)
CO2: 26 mmol/L (ref 22–32)
Calcium: 8.3 mg/dL — ABNORMAL LOW (ref 8.9–10.3)
Chloride: 98 mmol/L (ref 98–111)
Creatinine, Ser: 0.88 mg/dL (ref 0.61–1.24)
GFR calc Af Amer: >60 mL/min (ref >60)
GFR calc non Af Amer: >60 mL/min (ref >60)
Glucose, Bld: 164 mg/dL — ABNORMAL HIGH (ref 70–99)
Potassium: 3.2 mmol/L — ABNORMAL LOW (ref 3.5–5.1)
Sodium: 135 mmol/L (ref 135–145)
Total Bilirubin: 1 mg/dL (ref 0.3–1.2)
Total Protein: 6.8 g/dL (ref 6.5–8.1)

## 2019-09-09 LAB — CBC WITH DIFFERENTIAL/PLATELET
Abs Immature Granulocytes: 0.09 10*3/uL — ABNORMAL HIGH (ref 0.00–0.07)
Basophils Absolute: 0 10*3/uL (ref 0.0–0.1)
Basophils Relative: 0 %
Eosinophils Absolute: 0 10*3/uL (ref 0.0–0.5)
Eosinophils Relative: 0 %
HCT: 42.1 % (ref 39.0–52.0)
Hemoglobin: 12.5 g/dL — ABNORMAL LOW (ref 13.0–17.0)
Immature Granulocytes: 1 %
Lymphocytes Relative: 2 %
Lymphs Abs: 0.3 10*3/uL — ABNORMAL LOW (ref 0.7–4.0)
MCH: 24.2 pg — ABNORMAL LOW (ref 26.0–34.0)
MCHC: 29.7 g/dL — ABNORMAL LOW (ref 30.0–36.0)
MCV: 81.4 fL (ref 80.0–100.0)
Monocytes Absolute: 1.3 10*3/uL — ABNORMAL HIGH (ref 0.1–1.0)
Monocytes Relative: 9 %
Neutro Abs: 13.4 10*3/uL — ABNORMAL HIGH (ref 1.7–7.7)
Neutrophils Relative %: 88 %
Platelets: 178 10*3/uL (ref 150–400)
RBC: 5.17 MIL/uL (ref 4.22–5.81)
RDW: 17.9 % — ABNORMAL HIGH (ref 11.5–15.5)
WBC: 15.1 10*3/uL — ABNORMAL HIGH (ref 4.0–10.5)
nRBC: 0 % (ref 0.0–0.2)

## 2019-09-09 LAB — POC SARS CORONAVIRUS 2 AG -  ED: SARS Coronavirus 2 Ag: POSITIVE — AB

## 2019-09-09 LAB — BRAIN NATRIURETIC PEPTIDE: B Natriuretic Peptide: 488 pg/mL — ABNORMAL HIGH (ref 0.0–100.0)

## 2019-09-09 LAB — LACTIC ACID, PLASMA: Lactic Acid, Venous: 1.2 mmol/L (ref 0.5–1.9)

## 2019-09-09 LAB — LACTATE DEHYDROGENASE: LDH: 135 U/L (ref 98–192)

## 2019-09-09 LAB — FIBRINOGEN: Fibrinogen: 677 mg/dL — ABNORMAL HIGH (ref 210–475)

## 2019-09-09 LAB — D-DIMER, QUANTITATIVE: D-Dimer, Quant: 0.39 ug/mL-FEU (ref 0.00–0.50)

## 2019-09-09 LAB — FERRITIN: Ferritin: 45 ng/mL (ref 24–336)

## 2019-09-09 LAB — TRIGLYCERIDES: Triglycerides: 35 mg/dL (ref <150)

## 2019-09-09 LAB — PROCALCITONIN: Procalcitonin: 0.25 ng/mL

## 2019-09-09 LAB — C-REACTIVE PROTEIN: CRP: 12.1 mg/dL — ABNORMAL HIGH (ref <1.0)

## 2019-09-09 MED ORDER — HYDRALAZINE HCL 25 MG PO TABS
50.0000 mg | ORAL_TABLET | Freq: Three times a day (TID) | ORAL | Status: DC
  Administered 2019-09-09 – 2019-09-17 (×24): 50 mg via ORAL
  Filled 2019-09-09 (×25): qty 2

## 2019-09-09 MED ORDER — SODIUM CHLORIDE 0.9% FLUSH
3.0000 mL | Freq: Two times a day (BID) | INTRAVENOUS | Status: DC
  Administered 2019-09-09 – 2019-09-17 (×16): 3 mL via INTRAVENOUS

## 2019-09-09 MED ORDER — ALLOPURINOL 300 MG PO TABS
300.0000 mg | ORAL_TABLET | Freq: Every day | ORAL | Status: DC
  Administered 2019-09-10 – 2019-09-17 (×8): 300 mg via ORAL
  Filled 2019-09-09: qty 1
  Filled 2019-09-09 (×5): qty 3
  Filled 2019-09-09: qty 1
  Filled 2019-09-09: qty 3
  Filled 2019-09-09: qty 1
  Filled 2019-09-09: qty 3
  Filled 2019-09-09 (×4): qty 1
  Filled 2019-09-09: qty 3
  Filled 2019-09-09: qty 1

## 2019-09-09 MED ORDER — ENOXAPARIN SODIUM 80 MG/0.8ML ~~LOC~~ SOLN
80.0000 mg | SUBCUTANEOUS | Status: DC
  Administered 2019-09-09 – 2019-09-17 (×9): 80 mg via SUBCUTANEOUS
  Filled 2019-09-09 (×9): qty 0.8

## 2019-09-09 MED ORDER — ATORVASTATIN CALCIUM 10 MG PO TABS
10.0000 mg | ORAL_TABLET | Freq: Every day | ORAL | Status: DC
  Administered 2019-09-09 – 2019-09-17 (×9): 10 mg via ORAL
  Filled 2019-09-09 (×9): qty 1

## 2019-09-09 MED ORDER — ACETAMINOPHEN 325 MG PO TABS
650.0000 mg | ORAL_TABLET | Freq: Four times a day (QID) | ORAL | Status: DC | PRN
  Administered 2019-09-12: 650 mg via ORAL
  Filled 2019-09-09: qty 2

## 2019-09-09 MED ORDER — ONDANSETRON HCL 4 MG/2ML IJ SOLN: 4.0000 mg | Freq: Four times a day (QID) | INTRAMUSCULAR | Status: DC | PRN

## 2019-09-09 MED ORDER — SODIUM CHLORIDE 0.9 % IV SOLN
200.0000 mg | Freq: Once | INTRAVENOUS | Status: AC
  Administered 2019-09-09: 200 mg via INTRAVENOUS
  Filled 2019-09-09: qty 40

## 2019-09-09 MED ORDER — FUROSEMIDE 20 MG PO TABS
40.0000 mg | ORAL_TABLET | Freq: Two times a day (BID) | ORAL | Status: DC
  Administered 2019-09-09 – 2019-09-17 (×17): 40 mg via ORAL
  Filled 2019-09-09 (×8): qty 2
  Filled 2019-09-09: qty 1
  Filled 2019-09-09 (×8): qty 2

## 2019-09-09 MED ORDER — DEXAMETHASONE SODIUM PHOSPHATE 10 MG/ML IJ SOLN
6.0000 mg | Freq: Once | INTRAMUSCULAR | Status: AC
  Administered 2019-09-09: 6 mg via INTRAVENOUS
  Filled 2019-09-09: qty 1

## 2019-09-09 MED ORDER — SODIUM CHLORIDE 0.9% FLUSH
3.0000 mL | Freq: Two times a day (BID) | INTRAVENOUS | Status: DC
  Administered 2019-09-09 – 2019-09-17 (×17): 3 mL via INTRAVENOUS

## 2019-09-09 MED ORDER — ASPIRIN EC 81 MG PO TBEC
81.0000 mg | DELAYED_RELEASE_TABLET | Freq: Every day | ORAL | Status: DC
  Administered 2019-09-09 – 2019-09-17 (×9): 81 mg via ORAL
  Filled 2019-09-09 (×9): qty 1

## 2019-09-09 MED ORDER — SODIUM CHLORIDE 0.9 % IV SOLN
100.0000 mg | Freq: Every day | INTRAVENOUS | Status: AC
  Administered 2019-09-10 – 2019-09-13 (×4): 100 mg via INTRAVENOUS
  Filled 2019-09-09 (×5): qty 20

## 2019-09-09 MED ORDER — CEFAZOLIN SODIUM-DEXTROSE 2-4 GM/100ML-% IV SOLN
2.0000 g | Freq: Three times a day (TID) | INTRAVENOUS | Status: DC
  Administered 2019-09-09 – 2019-09-15 (×18): 2 g via INTRAVENOUS
  Filled 2019-09-09 (×21): qty 100

## 2019-09-09 MED ORDER — SENNOSIDES-DOCUSATE SODIUM 8.6-50 MG PO TABS
1.0000 | ORAL_TABLET | Freq: Every evening | ORAL | Status: DC | PRN
  Filled 2019-09-09: qty 1

## 2019-09-09 MED ORDER — IRBESARTAN 300 MG PO TABS
300.0000 mg | ORAL_TABLET | Freq: Every day | ORAL | Status: DC
  Administered 2019-09-09 – 2019-09-17 (×8): 300 mg via ORAL
  Filled 2019-09-09 (×9): qty 1

## 2019-09-09 MED ORDER — ENOXAPARIN SODIUM 40 MG/0.4ML ~~LOC~~ SOLN: 40.0000 mg | SUBCUTANEOUS | Status: DC

## 2019-09-09 MED ORDER — POTASSIUM CHLORIDE CRYS ER 20 MEQ PO TBCR
40.0000 meq | EXTENDED_RELEASE_TABLET | Freq: Once | ORAL | Status: AC
  Administered 2019-09-09: 40 meq via ORAL
  Filled 2019-09-09: qty 2

## 2019-09-09 MED ORDER — SODIUM CHLORIDE 0.9 % IV SOLN: 250.0000 mL | INTRAVENOUS | Status: DC | PRN

## 2019-09-09 MED ORDER — CLOPIDOGREL BISULFATE 75 MG PO TABS
75.0000 mg | ORAL_TABLET | Freq: Every day | ORAL | Status: DC
  Administered 2019-09-09 – 2019-09-17 (×9): 75 mg via ORAL
  Filled 2019-09-09 (×9): qty 1

## 2019-09-09 MED ORDER — METOPROLOL SUCCINATE ER 100 MG PO TB24
100.0000 mg | ORAL_TABLET | Freq: Every day | ORAL | Status: DC
  Administered 2019-09-09 – 2019-09-17 (×9): 100 mg via ORAL
  Filled 2019-09-09 (×7): qty 1
  Filled 2019-09-09: qty 4
  Filled 2019-09-09: qty 1

## 2019-09-09 MED ORDER — OLMESARTAN MEDOXOMIL-HCTZ 40-25 MG PO TABS: 1.0000 | ORAL_TABLET | Freq: Every day | ORAL | Status: DC

## 2019-09-09 MED ORDER — HYDROCHLOROTHIAZIDE 25 MG PO TABS
25.0000 mg | ORAL_TABLET | Freq: Every day | ORAL | Status: DC
  Administered 2019-09-09 – 2019-09-17 (×9): 25 mg via ORAL
  Filled 2019-09-09 (×9): qty 1

## 2019-09-09 MED ORDER — SODIUM CHLORIDE 0.9% FLUSH: 3.0000 mL | INTRAVENOUS | Status: DC | PRN

## 2019-09-09 MED ORDER — ONDANSETRON HCL 4 MG PO TABS: 4.0000 mg | ORAL_TABLET | Freq: Four times a day (QID) | ORAL | Status: DC | PRN

## 2019-09-09 MED ORDER — DEXAMETHASONE SODIUM PHOSPHATE 10 MG/ML IJ SOLN
6.0000 mg | INTRAMUSCULAR | Status: DC
  Administered 2019-09-10 – 2019-09-17 (×8): 6 mg via INTRAVENOUS
  Filled 2019-09-09 (×8): qty 1

## 2019-09-09 NOTE — ED Notes (Signed)
Pt states he had positive covid test on Friday, MD made aware

## 2019-09-09 NOTE — ED Provider Notes (Signed)
[facility] EMERGENCY DEPARTMENT Provider Note   CSN: [id number] Arrival date & time: 09/09/19  0147     History Chief Complaint  Patient presents with  . Shortness of Breath  Level 5 caveat due to acuity of condition/shortness of breath  [name] [name] is a 63 y.o. male.  The history is provided by the patient.  Shortness of Breath Severity:  Moderate Onset quality:  Gradual Timing:  Intermittent Progression:  Worsening Chronicity:  New Relieved by:  Nothing Worsened by:  Nothing Associated symptoms: cough, fever and vomiting   Patient with history of asbestosis, hypertension, chronic lymphedema presents with shortness of breath. Patient arrives from home via [facility] Patient reports he has had cough, fever, shortness of breath over the past 3-4 days.  He reports he was seen by his PCP last week and had a positive COVID-19 test.  He was placed on prednisone to try to manage this at home.  However tonight he became more short of breath and had vomiting Patient is not on chronic oxygen     Past Medical History:  Diagnosis Date  . 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 Essential hypertension   . History of nephrolithiasis   . History of open leg wound   . History of stroke July 2004  . Obstructive sleep apnea   . TIA (transient ischemic attack)   . Varicose veins    Laser ablation of left great saphenous vein 2009    Patient Active Problem List   Diagnosis Date Noted  . Essential hypertension 10/14/2014  . Chronic diastolic heart failure ([place]) 10/14/2014  . Morbid obesity ([place]) 07/22/2013  . Varicose veins of lower extremities with other complications [id number]  . OSA (obstructive sleep apnea) 07/22/2013  . Venous stasis ulcers ([place]) 07/22/2013    Past Surgical History:  Procedure Laterality Date  . CYSTOSCOPY WITH RETROGRADE PYELOGRAM, URETEROSCOPY AND STENT PLACEMENT Right 08/02/2015   Procedure: CYSTOSCOPY WITH RETROGRADE PYELOGRAM, URETEROSCOPY , STONE  EXTRACTION AND STENT PLACEMENT;  Surgeon: Cleon Gustin, MD;  Location: [facility];  Service: Urology;  Laterality: Right;  . HOLMIUM LASER APPLICATION Right [id number]   Procedure: HOLMIUM LASER APPLICATION;  Surgeon: Cleon Gustin, MD;  Location: [facility];  Service: Urology;  Laterality: Right;  . KIDNEY STONE SURGERY    . VASECTOMY  1984  . VEIN SURGERY Left 06/2010   Laser ablation       Family History  Problem Relation Age of Onset  . Bone cancer Mother   . Hypertension Father   . Pancreatic cancer Maternal Grandmother   . Stroke Paternal Grandmother   . Diabetes Paternal Grandfather   . Heart attack Brother   . Hypertension Brother   . Heart attack Sister     Social History   Tobacco Use  . Smoking status: Never Smoker  . Smokeless tobacco: Never Used  Substance Use Topics  . Alcohol use: No    Alcohol/week: 0.0 standard drinks  . Drug use: No    Home Medications Prior to Admission medications   Medication Sig Start Date End Date Taking? Authorizing Provider  acetaminophen (TYLENOL) 500 MG tablet Take 1,000 mg by mouth daily as needed (pain).    [provider]  allopurinol (ZYLOPRIM) 300 MG tablet Take 300 mg by mouth daily.    [provider]  Ascorbic Acid (VITAMIN C) 1000 MG tablet Take 6,000 mg by mouth daily as needed (when a cold is coming on.).    [provider]  aspirin EC 81 MG tablet Take 1 tablet (81 mg total) by mouth daily. 05/10/18   Satira Sark, MD  atorvastatin (LIPITOR) 10 MG tablet Take 10 mg by mouth daily.    [provider]  clopidogrel (PLAVIX) 75 MG tablet Take 75 mg by mouth daily.    [provider]  furosemide (LASIX) 40 MG tablet Take 40 mg by mouth 2 (two) times daily.     [provider]  hydrALAZINE (APRESOLINE) 50 MG tablet TAKE 1 TABLET BY MOUTH TWICE A DAY. NEED OFFICE VISIT FOR FURTHER REFILLS Patient taking differently: Take 50 mg by mouth 3 (three) times daily.  Increased by Dr. Quintin Alto 03/08/18   Satira Sark, MD  ibuprofen (ADVIL,MOTRIN) 200 MG tablet Take 400-600 mg by mouth daily as needed (pain).     [provider]  metoprolol succinate (TOPROL-XL) 100 MG 24 hr tablet Take 100 mg by mouth daily. Take with or immediately following a meal.    [provider]  olmesartan-hydrochlorothiazide (BENICAR HCT) 40-25 MG tablet Take 1 tablet by mouth daily. 04/22/18   [provider]    Allergies    Patient has no known allergies.  Review of Systems   Review of Systems  Unable to perform ROS: Acuity of condition  Constitutional: Positive for fever.  Respiratory: Positive for cough and shortness of breath.   Gastrointestinal: Positive for vomiting.    Physical Exam Updated Vital Signs BP (!) 164/83   Pulse 78   Temp 99.2 F (37.3 C) (Oral)   Resp (!) 25   Ht 1.778 m (5\' 10" )   Wt (!) 163.3 kg   SpO2 96%   BMI 51.65 kg/m   Physical Exam CONSTITUTIONAL: Chronically ill-appearing HEAD: Normocephalic/atraumatic EYES: EOMI/PERRL ENMT: Mucous membranes moist NECK: supple no meningeal signs SPINE/BACK:entire spine nontender CV: S1/S2 noted, no murmurs/rubs/gallops noted LUNGS: Tachypneic, can only speak in short sentences, wheezing bilaterally ABDOMEN: soft, nontender, obese GU:no cva tenderness NEURO: Pt is awake/alert/appropriate, moves all extremitiesx4.  No facial droop.   EXTREMITIES: pulses normal/equal, full ROM, chronic lymphedema noted bilateral lower extremities with bandages in place. SKIN: warm, color normal PSYCH: Anxious ED Results / Procedures / Treatments   Labs (all labs ordered are listed, but only abnormal results are displayed) Labs Reviewed  CBC WITH DIFFERENTIAL/PLATELET - Abnormal; Notable for the following components:      Result Value   WBC 15.1 (*)    Hemoglobin 12.5 (*)    MCH 24.2 (*)    MCHC 29.7 (*)    RDW 17.9 (*)    Neutro Abs 13.4 (*)    Lymphs Abs 0.3 (*)    Monocytes  Absolute 1.3 (*)    Abs Immature Granulocytes 0.09 (*)    All other components within normal limits  COMPREHENSIVE METABOLIC PANEL - Abnormal; Notable for the following components:   Potassium 3.2 (*)    Glucose, Bld 164 (*)    Calcium 8.3 (*)    Albumin 3.3 (*)    All other components within normal limits  FIBRINOGEN - Abnormal; Notable for the following components:   Fibrinogen 677 (*)    All other components within normal limits  POC SARS CORONAVIRUS 2 AG -  ED - Abnormal; Notable for the following components:   SARS Coronavirus 2 Ag POSITIVE (*)    All other components within normal limits  CULTURE, BLOOD (ROUTINE X 2)  CULTURE, BLOOD (ROUTINE X 2)  LACTIC ACID, PLASMA  D-DIMER, QUANTITATIVE (NOT AT  [facility])  LACTATE DEHYDROGENASE  TRIGLYCERIDES  LACTIC ACID, PLASMA  PROCALCITONIN  FERRITIN  C-REACTIVE PROTEIN  BRAIN NATRIURETIC PEPTIDE    EKG EKG Interpretation  Date/Time:  Tuesday September 09 2019 01:51:55 EST Ventricular Rate:  82 PR Interval:    QRS Duration: 100 QT Interval:  402 QTC Calculation: 470 R Axis:   46 Text Interpretation: Sinus rhythm Ventricular premature complex Borderline prolonged PR interval Nonspecific repol abnormality, inferior leads Baseline wander in lead(s) II III aVF Interpretation limited secondary to artifact Confirmed by Ripley Fraise [phone]) on 09/09/2019 1:58:31 AM   Radiology DG Chest Port 1 View  Result Date: 09/09/2019 CLINICAL DATA:  Shortness of breath, chills, nausea, vomiting and cough, fever EXAM: PORTABLE CHEST 1 VIEW COMPARISON:  Radiograph 07/28/2015 FINDINGS: Multifocal patchy interstitial and airspace opacity throughout the lungs with some peripheral predominance. No pneumothorax. No visible effusion the portions of the left costophrenic sulcus are collimated. There is cardiomegaly which may be accentuated by portable technique. No acute osseous or soft tissue abnormality. IMPRESSION: 1. Multifocal patchy interstitial and  airspace opacities throughout the lungs with some peripheral predominance. Findings are suspicious for multifocal pneumonia. Electronically Signed   By: Lovena Le M.D.   On: 09/09/2019 02:20    Procedures .Critical Care Performed by: Ripley Fraise, MD Authorized by: Ripley Fraise, MD   Critical care provider statement:    Critical care time (minutes):  40   Critical care start time:  09/09/2019 2:20 AM   Critical care end time:  09/09/2019 3:00 AM   Critical care time was exclusive of:  Separately billable procedures and treating other patients   Critical care was necessary to treat or prevent imminent or life-threatening deterioration of the following conditions:  Respiratory failure   Critical care was time spent personally by me on the following activities:  Re-evaluation of patient's condition, ordering and review of laboratory studies, ordering and review of radiographic studies, pulse oximetry, discussions with consultants, evaluation of patient's response to treatment, development of treatment plan with patient or surrogate and examination of patient   I assumed direction of critical care for this patient from another provider in my specialty: no      Medications Ordered in ED Medications  remdesivir 200 mg in sodium chloride 0.9% 250 mL IVPB (200 mg Intravenous New Bag/Given 09/09/19 0338)    Followed by  remdesivir 100 mg in sodium chloride 0.9 % 100 mL IVPB (has no administration in time range)  potassium chloride SA (KLOR-CON) CR tablet 40 mEq (has no administration in time range)  dexamethasone (DECADRON) injection 6 mg (6 mg Intravenous Given 09/09/19 0231)    ED Course  I have reviewed the triage vital signs and the nursing notes.  Pertinent labs & imaging results that were available during my care of the patient were reviewed by me and considered in my medical decision making (see chart for details).    MDM Rules/Calculators/A&P                      2:37  AM Patient presents with increasing shortness of breath.  He reports has been feeling sick since over 3-4 days ago Patient currently on 3 L nasal cannula, but his work of breathing is worsening.  Patient has a history of asbestosis, but has been stable on this without need for supplemental oxygen chronically. Patient will require admission for COVID-19 pneumonia Spoke to his daughter via phone and gave her the update. 3:51 AM Patient appears improved.  Work of breathing is improved on high flow oxygen He is receiving Decadron and remdesivir Patient be admitted to hospital.  I do feel he is high risk for worsening and should be closely monitored.  However at this time he does not require intubation. Discussed with Dr. Myna Hidalgo for admission  [name] [name] was evaluated in Emergency Department on 09/09/2019 for the symptoms described in the history of present illness. He was evaluated in the context of the global COVID-19 pandemic, which necessitated consideration that the patient might be at risk for infection with the SARS-CoV-2 virus that causes COVID-19. Institutional protocols and algorithms that pertain to the evaluation of patients at risk for COVID-19 are in a state of rapid change based on information released by regulatory bodies including the CDC and federal and state organizations. These policies and algorithms were followed during the patient's care in the ED. Final Clinical Impression(s) / ED Diagnoses Final diagnoses:  Acute respiratory failure with hypoxia ([place])  Pneumonia due to COVID-19 virus    Rx / DC Orders ED Discharge Orders    None       Ripley Fraise, MD 09/09/19 [phone]

## 2019-09-09 NOTE — ED Notes (Signed)
Pt left with [facility] 

## 2019-09-09 NOTE — ED Notes (Signed)
Pt placed on hydrated high flow O2 per MD order

## 2019-09-09 NOTE — ED Notes (Signed)
Pt requested to sit up in chair to help breathing

## 2019-09-09 NOTE — Progress Notes (Signed)
Per HPI: [name] [name] is a 63 y.o. 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  Patient reports that he began to develop chills, fatigue, and malaise on 09/05/2019, had some nausea with nonbloody vomiting overnight, and has had insidiously worsening shortness of breath.  He has not been coughing much, denies chest pain, denies abdominal pain or diarrhea, and reports that his chronic leg wounds are stable.  He denies any smoking history, denies any underlying lung disease despite his asbestos exposure.  Patient took Tylenol, called EMS, was found to be saturating in the mid 80s on room air, and was brought into the ED on 3 L/min of supplemental oxygen.  12/22: Patient was admitted with acute hypoxemic respiratory failure secondary to COVID-19 pneumonia and has been started on Decadron and remdesivir.  Continue to monitor inflammatory markers.  I have seen and evaluated the patient at bedside and he appears to be doing much better and has no increased work of breathing.  He is currently on 6 L nasal cannula oxygen.  Continue treatments as scheduled and transfer to [facility] once bed available for further care.  He also has chronic diastolic CHF that appears compensated at this time and blood pressures are well controlled.  He is noted to have some low potassium and has been given oral supplementation; follow-up in a.m.  Total care time: 20 minutes.

## 2019-09-09 NOTE — Progress Notes (Signed)
Patient admitted to room 118 at [facility].  A/O x 4, VSS on RA.  Patient admitted to tele.  Physician notified of arrival.  Will continue to monitor.

## 2019-09-09 NOTE — H&P (Signed)
History and Physical    [name] [id number] DOB: 09/25/1955 DOA: 09/09/2019  PCP: Manon Hilding, MD   Patient coming from: Home   Chief Complaint: Chills, N/V, SOB   HPI: [name] [name] is a 63 y.o. 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  Patient reports that he began to develop chills, fatigue, and malaise on 09/05/2019, had some nausea with nonbloody vomiting overnight, and has had insidiously worsening shortness of breath.  He has not been coughing much, denies chest pain, denies abdominal pain or diarrhea, and reports that his chronic leg wounds are stable.  He denies any smoking history, denies any underlying lung disease despite his asbestos exposure.  Patient took Tylenol, called EMS, was found to be saturating in the mid 80s on room air, and was brought into the ED on 3 L/min of supplemental oxygen.  ED Course: Upon arrival to the ED, patient is found to be afebrile, initially saturating adequately on 3 L/min of supplemental oxygen but requiring 6 L/min by time of admission.  He has been tachypneic in the low 30s while at rest in the ED.  EKG features a sinus rhythm with PVC and nonspecific repolarization abnormality.  Chest x-ray is concerning for multifocal patchy interstitial and airspace opacities bilaterally.  Chemistry panel notable for potassium 3.2 and CBC features a leukocytosis to 15,100.  Lactic acid is normal and COVID-19 testing is positive.  Blood cultures were collected in the emergency department and the patient was treated with Decadron, remdesivir, and supplemental oxygen.  Review of Systems:  All other systems reviewed and apart from HPI, are negative.  Past Medical History:  Diagnosis Date  . Asbestosis  ([place])   . Essential hypertension   . History of nephrolithiasis   . History of open leg wound   . History of stroke July 2004  . Obstructive sleep apnea   . TIA (transient ischemic attack)   . Varicose veins    Laser ablation of left great saphenous vein 2009    Past Surgical History:  Procedure Laterality Date  . CYSTOSCOPY WITH RETROGRADE PYELOGRAM, URETEROSCOPY AND STENT PLACEMENT Right 08/02/2015   Procedure: CYSTOSCOPY WITH RETROGRADE PYELOGRAM, URETEROSCOPY , STONE EXTRACTION AND STENT PLACEMENT;  Surgeon: Cleon Gustin, MD;  Location: [facility];  Service: Urology;  Laterality: Right;  . HOLMIUM LASER APPLICATION Right [id number]   Procedure: HOLMIUM LASER APPLICATION;  Surgeon: Cleon Gustin, MD;  Location: [facility];  Service: Urology;  Laterality: Right;  . KIDNEY STONE SURGERY    . VASECTOMY  1984  . VEIN SURGERY Left 06/2010   Laser ablation     reports that he has never smoked. He has never used smokeless tobacco. He reports that he does not drink alcohol or use drugs.  No Known Allergies  Family History  Problem Relation Age of Onset  . Bone cancer Mother   . Hypertension Father   . Pancreatic cancer Maternal Grandmother   . Stroke Paternal Grandmother   . Diabetes Paternal Grandfather   . Heart attack Brother   . Hypertension Brother   . Heart attack Sister      Prior to Admission medications   Medication Sig Start Date End Date Taking? Authorizing Provider  acetaminophen (TYLENOL) 500 MG tablet Take 1,000 mg by mouth daily as  needed (pain).    [provider]  allopurinol (ZYLOPRIM) 300 MG tablet Take 300 mg by mouth daily.    [provider]  Ascorbic Acid (VITAMIN C) 1000 MG tablet Take 6,000 mg by mouth daily as needed (when a cold is coming on.).    [provider]  aspirin EC 81 MG tablet Take 1 tablet (81 mg total) by mouth daily. 05/10/18   Satira Sark, MD  atorvastatin (LIPITOR) 10 MG tablet Take 10 mg by mouth  daily.    [provider]  clopidogrel (PLAVIX) 75 MG tablet Take 75 mg by mouth daily.    [provider]  furosemide (LASIX) 40 MG tablet Take 40 mg by mouth 2 (two) times daily.     [provider]  hydrALAZINE (APRESOLINE) 50 MG tablet TAKE 1 TABLET BY MOUTH TWICE A DAY. NEED OFFICE VISIT FOR FURTHER REFILLS Patient taking differently: Take 50 mg by mouth 3 (three) times daily. Increased by Dr. Quintin Alto 03/08/18   Satira Sark, MD  ibuprofen (ADVIL,MOTRIN) 200 MG tablet Take 400-600 mg by mouth daily as needed (pain).     [provider]  metoprolol succinate (TOPROL-XL) 100 MG 24 hr tablet Take 100 mg by mouth daily. Take with or immediately following a meal.    [provider]  olmesartan-hydrochlorothiazide (BENICAR HCT) 40-25 MG tablet Take 1 tablet by mouth daily. 04/22/18   [provider]    Physical Exam: Vitals:   09/09/19 0152 09/09/19 0153 09/09/19 0200 09/09/19 0330  BP: (!) 166/93  (!) 164/83 (!) 161/81  Pulse: 82  78 (!) 57  Resp: (!) 31  (!) 25 (!) 23  Temp: 99.2 F (37.3 C)     TempSrc: Oral     SpO2: 97%  96% 95%  Weight:  (!) 163.3 kg    Height:  5\' 10"  (1.778 m)      Constitutional: Tachypneic, no diaphoresis, not in acute distress  Eyes: PERTLA, lids and conjunctivae normal ENMT: Mucous membranes are moist. Posterior pharynx clear of any exudate or lesions.   Neck: normal, supple, no masses, no thyromegaly Respiratory: Tachypneic. Increased WOB. No pallor or cyanosis.   Cardiovascular: S1 & S2 heard, regular rate and rhythm. Leg swelling bilaterally. Abdomen: No distension, no tenderness, soft. Bowel sounds active.  Musculoskeletal: no clubbing / cyanosis. No joint deformity upper and lower extremities.  Skin: Lower legs wrapped. Warm, dry, well-perfused. Neurologic: No facial asymmetry. Sensation intact. Moving all extremities.  Psychiatric: Alert. Answering questions appropriately. Calm, cooperative.       Labs on Admission: I have personally reviewed following labs and imaging studies  CBC: Recent Labs  Lab 09/09/19 0234  WBC 15.1*  NEUTROABS 13.4*  HGB 12.5*  HCT 42.1  MCV 81.4  PLT 0000000   Basic Metabolic Panel: Recent Labs  Lab 09/09/19 0234  NA 135  K 3.2*  CL 98  CO2 26  GLUCOSE 164*  BUN 23  CREATININE 0.88  CALCIUM 8.3*   GFR: Estimated Creatinine Clearance: 130.9 mL/min (by C-G formula based on SCr of 0.88 mg/dL). Liver Function Tests: Recent Labs  Lab 09/09/19 0234  AST 16  ALT 19  ALKPHOS 53  BILITOT 1.0  PROT 6.8  ALBUMIN 3.3*   No results for input(s): LIPASE, AMYLASE in the last 168 hours. No results for input(s): AMMONIA in the last 168 hours. Coagulation Profile: No results for input(s): INR, PROTIME in the last 168 hours. Cardiac Enzymes: No results for input(s):  CKTOTAL, CKMB, CKMBINDEX, TROPONINI in the last 168 hours. BNP (last 3 results) No results for input(s): PROBNP in the last 8760 hours. HbA1C: No results for input(s): HGBA1C in the last 72 hours. CBG: No results for input(s): GLUCAP in the last 168 hours. Lipid Profile: Recent Labs    09/09/19 0234  TRIG 35   Thyroid Function Tests: No results for input(s): TSH, T4TOTAL, FREET4, T3FREE, THYROIDAB in the last 72 hours. Anemia Panel: Recent Labs    09/09/19 0250  FERRITIN 45   Urine analysis: No results found for: COLORURINE, APPEARANCEUR, LABSPEC, PHURINE, GLUCOSEU, HGBUR, BILIRUBINUR, KETONESUR, PROTEINUR, UROBILINOGEN, NITRITE, LEUKOCYTESUR Sepsis Labs: @LABRCNTIP (procalcitonin:4,lacticidven:4) )No results found for this or any previous visit (from the past 240 hour(s)).   Radiological Exams on Admission: DG Chest Port 1 View  Result Date: 09/09/2019 CLINICAL DATA:  Shortness of breath, chills, nausea, vomiting and cough, fever EXAM: PORTABLE CHEST 1 VIEW COMPARISON:  Radiograph 07/28/2015 FINDINGS: Multifocal patchy interstitial and airspace opacity throughout  the lungs with some peripheral predominance. No pneumothorax. No visible effusion the portions of the left costophrenic sulcus are collimated. There is cardiomegaly which may be accentuated by portable technique. No acute osseous or soft tissue abnormality. IMPRESSION: 1. Multifocal patchy interstitial and airspace opacities throughout the lungs with some peripheral predominance. Findings are suspicious for multifocal pneumonia. Electronically Signed   By: Lovena Le M.D.   On: 09/09/2019 02:20    EKG: Independently reviewed. Sinus rhythm, PVC, non-specific repolarization abnormality.   Assessment/Plan   1. COVID-19 pneumonia; acute hypoxic respiratory failure  - Presents with chills, fatigue, malaise, SOB, and N/V, and is found to have CXR findings consistent with COVID pna, tachypnea in low 30s at rest, and new supplemental O2 requirement now on 6 Lpm in ED   - Blood cultures were collected in ED and he was started on Decadron and remdesivir  - Continue Decadron, remdesivir, and supplemental O2 - Trend inflammatory markers, continue isolation and supportive care    2. Chronic diastolic CHF  - Appears compensated  - EF was preserved on echo from September 2019  - Continue Lasix, beta-blocker, and ARB as tolerated   3. Hypertension  - Continue antihypertensives as tolerated    4. Hypokalemia  - Serum potassium is 3.2 in ED - Replaced, monitor chem panels    5. Chronic venous stasis  - Patient follows with wound care for lymphedema and chronic venous stasis with ulcers, reports that wounds are stable/improving  - Continue wound care    DVT prophylaxis: Lovenox  Code Status: Full, confirmed with patient at time of admission  Family Communication: Discussed with patient   Consults called: None Admission status: Inpatient     Vianne Bulls, MD [facility] Pager [phone]  If 7PM-7AM, please contact night-coverage [email or web address] Password [email or web address]  09/09/2019, 4:11 AM

## 2019-09-09 NOTE — Progress Notes (Signed)
  PROGRESS NOTE    [name] [name]  [id number] DOB: [birth year] DOA: 09/09/2019 PCP: Manon Hilding, MD   Brief Narrative:   Updated by nursing staff patient's bilateral lower extremities have a pungent foul odor concerning likely subacute cellulitis.  We will have nurse remove patient's leg wrappings that appear to be his own from home and further evaluate legs with wound care consult as well.  Patient has previously followed in the outpatient setting with home health and wound care for management but appears to have fallen off follow-up over the past few months.  Cultures remain preliminary negative, patient remains without fever, cytosis at 15 although negative lactic acid.  Procalcitonin 0.25 while not negative is certainly reassuring for any fulminant infection at this time. Will cover with Ancef per cellulitis protocol for now.  Assessment & Plan:   Principal Problem:   Pneumonia due to COVID-19 virus Active Problems:   Venous stasis ulcers (HCC)   Essential hypertension   Chronic diastolic heart failure (HCC)   Hypokalemia   Acute respiratory failure with hypoxia (HCC)    LOS: 0 days   Time spent: 42minutes  Antwan Pandya C Rasul Decola, DO [facility]  If 7PM-7AM, please contact night-coverage [email or web address] Password [email or web address] 09/09/2019, 4:00 PM

## 2019-09-09 NOTE — Progress Notes (Signed)
Patient has bilateral leg wraps to the lower extremities.  Redness and edema noted.  Patient states that he used to receive [facility] care to treat venous stasis ulcers, until they healed.  Patient states that he wraps his legs himself, but has trouble doing so.  Asked patient to allow me to unwrap them to take a look, but he is refusing to do so until after his dinner.

## 2019-09-09 NOTE — Plan of Care (Signed)
  Problem: Education: Goal: Knowledge of General Education information will improve Description: Including pain rating scale, medication(s)/side effects and non-pharmacologic comfort measures 09/09/2019 2302 by Heloise Purpura, RN Outcome: Progressing 09/09/2019 2300 by Heloise Purpura, RN Outcome: Progressing   Problem: Health Behavior/Discharge Planning: Goal: Ability to manage health-related needs will improve 09/09/2019 2302 by Heloise Purpura, RN Outcome: Progressing 09/09/2019 2300 by Heloise Purpura, RN Outcome: Progressing   Problem: Clinical Measurements: Goal: Ability to maintain clinical measurements within normal limits will improve 09/09/2019 2302 by Heloise Purpura, RN Outcome: Progressing 09/09/2019 2300 by Heloise Purpura, RN Outcome: Progressing Goal: Will remain free from infection 09/09/2019 2302 by Heloise Purpura, RN Outcome: Progressing 09/09/2019 2300 by Heloise Purpura, RN Outcome: Progressing Goal: Diagnostic test results will improve 09/09/2019 2302 by Heloise Purpura, RN Outcome: Progressing 09/09/2019 2300 by Heloise Purpura, RN Outcome: Progressing Goal: Respiratory complications will improve 09/09/2019 2302 by Heloise Purpura, RN Outcome: Progressing 09/09/2019 2300 by Heloise Purpura, RN Outcome: Progressing Goal: Cardiovascular complication will be avoided 09/09/2019 2302 by Heloise Purpura, RN Outcome: Progressing 09/09/2019 2300 by Heloise Purpura, RN Outcome: Progressing   Problem: Activity: Goal: Risk for activity intolerance will decrease 09/09/2019 2302 by Heloise Purpura, RN Outcome: Progressing 09/09/2019 2300 by Heloise Purpura, RN Outcome: Progressing   Problem: Nutrition: Goal: Adequate nutrition will be maintained 09/09/2019 2302 by Heloise Purpura, RN Outcome: Progressing 09/09/2019 2300 by Heloise Purpura, RN Outcome: Progressing   Problem: Coping: Goal: Level of anxiety will decrease 09/09/2019 2302 by Heloise Purpura, RN Outcome: Progressing 09/09/2019 2300 by Heloise Purpura, RN Outcome: Progressing   Problem: Elimination: Goal: Will not experience complications related to bowel motility 09/09/2019 2302 by Heloise Purpura, RN Outcome: Progressing 09/09/2019 2300 by Heloise Purpura, RN Outcome: Progressing Goal: Will not experience complications related to urinary retention 09/09/2019 2302 by Heloise Purpura, RN Outcome: Progressing 09/09/2019 2300 by Heloise Purpura, RN Outcome: Progressing   Problem: Pain Managment: Goal: General experience of comfort will improve 09/09/2019 2302 by Heloise Purpura, RN Outcome: Progressing 09/09/2019 2300 by Heloise Purpura, RN Outcome: Progressing   Problem: Safety: Goal: Ability to remain free from injury will improve 09/09/2019 2302 by Heloise Purpura, RN Outcome: Progressing 09/09/2019 2300 by Heloise Purpura, RN Outcome: Progressing   Problem: Skin Integrity: Goal: Risk for impaired skin integrity will decrease 09/09/2019 2302 by Heloise Purpura, RN Outcome: Progressing 09/09/2019 2300 by Heloise Purpura, RN Outcome: Progressing   Problem: Education: Goal: Knowledge of risk factors and measures for prevention of condition will improve 09/09/2019 2302 by Heloise Purpura, RN Outcome: Progressing 09/09/2019 2300 by Heloise Purpura, RN Outcome: Progressing   Problem: Coping: Goal: Psychosocial and spiritual needs will be supported 09/09/2019 2302 by Heloise Purpura, RN Outcome: Progressing 09/09/2019 2300 by Heloise Purpura, RN Outcome: Progressing   Problem: Respiratory: Goal: Will maintain a patent airway 09/09/2019 2302 by Heloise Purpura, RN Outcome: Progressing 09/09/2019 2300 by Heloise Purpura, RN Outcome: Progressing Goal: Complications related to the disease process, condition or treatment will be avoided or minimized 09/09/2019 2302 by Heloise Purpura, RN Outcome: Progressing 09/09/2019 2300 by Heloise Purpura,  RN Outcome: Progressing

## 2019-09-09 NOTE — Progress Notes (Signed)
Pharmacy Note - Remdesivir Dosing  O:  ALT: pending CMET CXR: Findings are suspicious for multifocal pneumonia .Requiring supplemental O2: 94% on 3L per [place]   A/P:  Patient meets criteria for remdesivir.  Begin remdesivir 200 mg IV x 1, followed by 100 mg IV daily x 4 days  Monitor ALT, clinical progress  Despina Pole, Pharm. D. Clinical Pharmacist 09/09/2019 2:48 AM

## 2019-09-09 NOTE — ED Triage Notes (Signed)
Pt arrives via [facility] w/complaints of chills, & N/V. When EMS arrived Q@ pts home, pts O2 was 88% on RA, pt placed on 3L per [place], up to 94%. Pt appears SOB upon arrival to ED. Pt took tylenol before EMS arrived for fever.

## 2019-09-10 LAB — CBC WITH DIFFERENTIAL/PLATELET
Abs Immature Granulocytes: 0.07 10*3/uL (ref 0.00–0.07)
Basophils Absolute: 0 10*3/uL (ref 0.0–0.1)
Basophils Relative: 0 %
Eosinophils Absolute: 0 10*3/uL (ref 0.0–0.5)
Eosinophils Relative: 0 %
HCT: 43.5 % (ref 39.0–52.0)
Hemoglobin: 13 g/dL (ref 13.0–17.0)
Immature Granulocytes: 1 %
Lymphocytes Relative: 2 %
Lymphs Abs: 0.3 10*3/uL — ABNORMAL LOW (ref 0.7–4.0)
MCH: 24.3 pg — ABNORMAL LOW (ref 26.0–34.0)
MCHC: 29.9 g/dL — ABNORMAL LOW (ref 30.0–36.0)
MCV: 81.2 fL (ref 80.0–100.0)
Monocytes Absolute: 1.2 10*3/uL — ABNORMAL HIGH (ref 0.1–1.0)
Monocytes Relative: 8 %
Neutro Abs: 13.1 10*3/uL — ABNORMAL HIGH (ref 1.7–7.7)
Neutrophils Relative %: 89 %
Platelets: 190 10*3/uL (ref 150–400)
RBC: 5.36 MIL/uL (ref 4.22–5.81)
RDW: 18.2 % — ABNORMAL HIGH (ref 11.5–15.5)
WBC: 14.7 10*3/uL — ABNORMAL HIGH (ref 4.0–10.5)
nRBC: 0 % (ref 0.0–0.2)

## 2019-09-10 LAB — COMPREHENSIVE METABOLIC PANEL
ALT: 16 U/L (ref 0–44)
AST: 17 U/L (ref 15–41)
Albumin: 3.3 g/dL — ABNORMAL LOW (ref 3.5–5.0)
Alkaline Phosphatase: 55 U/L (ref 38–126)
Anion gap: 12 (ref 5–15)
BUN: 26 mg/dL — ABNORMAL HIGH (ref 8–23)
CO2: 29 mmol/L (ref 22–32)
Calcium: 8.8 mg/dL — ABNORMAL LOW (ref 8.9–10.3)
Chloride: 97 mmol/L — ABNORMAL LOW (ref 98–111)
Creatinine, Ser: 1.02 mg/dL (ref 0.61–1.24)
GFR calc Af Amer: >60 mL/min (ref >60)
GFR calc non Af Amer: >60 mL/min (ref >60)
Glucose, Bld: 147 mg/dL — ABNORMAL HIGH (ref 70–99)
Potassium: 4.2 mmol/L (ref 3.5–5.1)
Sodium: 138 mmol/L (ref 135–145)
Total Bilirubin: 1 mg/dL (ref 0.3–1.2)
Total Protein: 7 g/dL (ref 6.5–8.1)

## 2019-09-10 LAB — C-REACTIVE PROTEIN: CRP: 18.8 mg/dL — ABNORMAL HIGH (ref <1.0)

## 2019-09-10 LAB — ABO/RH: ABO/RH(D): B POS

## 2019-09-10 LAB — D-DIMER, QUANTITATIVE: D-Dimer, Quant: 0.41 ug/mL-FEU (ref 0.00–0.50)

## 2019-09-10 LAB — FERRITIN: Ferritin: 69 ng/mL (ref 24–336)

## 2019-09-10 LAB — MAGNESIUM: Magnesium: 1.8 mg/dL (ref 1.7–2.4)

## 2019-09-10 LAB — HIV ANTIBODY (ROUTINE TESTING W REFLEX): HIV Screen 4th Generation wRfx: NONREACTIVE

## 2019-09-10 MED ORDER — ORAL CARE MOUTH RINSE
15.0000 mL | Freq: Two times a day (BID) | OROMUCOSAL | Status: DC
  Administered 2019-09-10 – 2019-09-17 (×13): 15 mL via OROMUCOSAL

## 2019-09-10 NOTE — Plan of Care (Signed)

## 2019-09-10 NOTE — Consult Note (Signed)
WOC Nurse Consult Note: Patient receiving care in [place].  Patient is COVID +. Consult completed via telehealth after review of record, including images. Reason for Consult: chronic venous stasis to BLE Wound type: Scattered, small, non-infected/non-necrotic appearing venous stasis ulcers at various locations to BLE. Pressure Injury POA: Yes/No/NA Measurement: see photos Wound bed: all are superficial and pink Drainage (amount, consistency, odor) serous Periwound: dried, crusted, hemosiderin staining. Dressing procedure/placement/frequency:  Thoroughly wash BLE and feet with soapy water.  Remove as much dry skin and crusting as possible. Pat dry. Apply [place] Moisturizing ointment (pink and white tube in clean utility) to feet and legs, then place Xeroform gauze over open wounds Kellie Simmering [phone]). Beginning behind the toes and going to just below the knees, spiral wrap kerlex, then Ace Wraps.  Change daily. Monitor the wound area(s) for worsening of condition such as: Signs/symptoms of infection,  Increase in size,  Development of or worsening of odor, Development of pain, or increased pain at the affected locations.  Notify the medical team if any of these develop.  Thank you for the consult.  Discussed plan of care with the bedside nurse.  [place] nurse will not follow at this time.  Please re-consult the [place] team if needed.  Val Riles, RN, MSN, CWOCN, CNS-BC, pager [phone]

## 2019-09-10 NOTE — Progress Notes (Signed)
Pt was on the phone with family when RN entered room. Family was updated via pt's phone per request of pt.

## 2019-09-10 NOTE — Progress Notes (Signed)
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  Patient had a started symptoms on 09/05/2019.  In the emergency room, he was found to be saturating 80% on room air, treated with 6 L of nasal cannula oxygen and admitted to the hospital.   Assessment & Plan:   Principal Problem:   Pneumonia due to COVID-19 virus Active Problems:   Venous stasis ulcers (HCC)   Essential hypertension   Chronic diastolic heart failure (HCC)   Hypokalemia   Acute respiratory failure with hypoxia ([place])  pneumonia due to COVID-19 virus with acute hypoxemic respiratory failure: Continue to monitor due to significant symptoms, still on 2 L oxygen. chest physiotherapy, incentive spirometry, deep breathing exercises, sputum induction, mucolytic's and bronchodilators. Supplemental oxygen to keep saturations more than 90%. Covid directed therapy with , steroids, dexamethasone 6 mg IV daily day 2 remdesivir, day 2/5 antibiotics, not indicated for Covid, indicated for leg cellulitis. Due to severity of symptoms, patient will need daily inflammatory markers, liver function test to monitor and direct COVID-19 therapies.  Chronic venous stasis wound with superadded infection: Extensive venous stasis wound.  Wound care consult.  Dressing changes. We will continue Ancef until clinical improvement. Will need aggressive wound care.  Chronic diastolic heart failure: Appears compensated.  Continue Lasix, beta-blockers and ARB.  Hypertension: Blood pressures are stable.  Hypokalemia: Potassium replaced with improvement.   DVT prophylaxis: Lovenox subcu  Code Status: Full code Family Communication: None Disposition Plan: Remains inpatient   Consultants:   None, wound care  Procedures:   Wound care  Antimicrobials:  Anti-infectives (From admission, onward)   Start     Dose/Rate Route Frequency Ordered Stop   09/10/19 1000  remdesivir 100 mg in sodium chloride 0.9 % 100 mL IVPB     100 mg 200 mL/hr over 30 Minutes Intravenous Daily 09/09/19 0248 09/14/19 0959   09/09/19 1700  ceFAZolin (ANCEF) IVPB 2g/100 mL premix     2 g 200 mL/hr over 30 Minutes Intravenous Every 8 hours 09/09/19 1611     09/09/19 0600  remdesivir 200 mg in sodium chloride 0.9% 250 mL IVPB     200 mg 580 mL/hr over 30 Minutes Intravenous Once 09/09/19 0248 09/09/19 0441         Subjective: Patient seen and examined.  No overnight events.  Remains afebrile.  Currently on 2 L oxygen.  He thinks he survived with so many problems and he will survive this.  Objective: Vitals:   09/09/19 1444 09/09/19 1600 09/09/19 2100 09/10/19 0800  BP: (!) 168/90  (!) 166/88 (!) 149/79  Pulse: (!) 57 60  68  Resp: 19 20  18   Temp: 97.7 F (36.5 C)  99 F (37.2 C) 98 F (36.7 C)  TempSrc: Oral  Oral Oral  SpO2: 95% 95%  95%  Weight:      Height:        Intake/Output Summary (Last 24 hours) at 09/10/2019 1348 Last data filed at 09/10/2019 1000 Gross per 24 hour  Intake 580 ml  Output 1200 ml  Net -620 ml   Filed Weights   09/09/19 0153  Weight: (!) 163.3 kg  Examination:  General exam: Appears calm and comfortable, anxious, on 2 L and comfortable. Respiratory system: Clear to auscultation. Respiratory effort normal.  On 2 L oxygen.  No added sound. Cardiovascular system: S1 & S2 heard, RRR. No JVD, murmurs, rubs, gallops or clicks.  Extensive venous stasis wound. Gastrointestinal system: Abdomen is nondistended, soft and nontender. No organomegaly or masses felt. Normal bowel sounds heard. Central nervous system: Alert and oriented. No focal  neurological deficits. Extremities: Symmetric 5 x 5 power. Skin: No rashes, lesions or ulcers Psychiatry: Judgement and insight appear normal. Mood & affect anxious. Extensive venous stasis wound bilateral leg with some areas of redness and drainage, no fluctuation or induration.             Data Reviewed: I have personally reviewed following labs and imaging studies  CBC: Recent Labs  Lab 09/09/19 0234 09/10/19 0055  WBC 15.1* 14.7*  NEUTROABS 13.4* 13.1*  HGB 12.5* 13.0  HCT 42.1 43.5  MCV 81.4 81.2  PLT 178 [id number]   Basic Metabolic Panel: Recent Labs  Lab 09/09/19 0234 09/10/19 0055  NA 135 138  K 3.2* 4.2  CL 98 97*  CO2 26 29  GLUCOSE 164* 147*  BUN 23 26*  CREATININE 0.88 1.02  CALCIUM 8.3* 8.8*  MG  --  1.8   GFR: Estimated Creatinine Clearance: 112.9 mL/min (by C-G formula based on SCr of 1.02 mg/dL). Liver Function Tests: Recent Labs  Lab 09/09/19 0234 09/10/19 0055  AST 16 17  ALT 19 16  ALKPHOS 53 55  BILITOT 1.0 1.0  PROT 6.8 7.0  ALBUMIN 3.3* 3.3*   No results for input(s): LIPASE, AMYLASE in the last 168 hours. No results for input(s): AMMONIA in the last 168 hours. Coagulation Profile: No results for input(s): INR, PROTIME in the last 168 hours. Cardiac Enzymes: No results for input(s): CKTOTAL, CKMB, CKMBINDEX, TROPONINI in the last 168 hours. BNP (last 3 results) No results for input(s): PROBNP in the last 8760 hours. HbA1C: No results for input(s): HGBA1C in the last 72 hours. CBG: No results for input(s): GLUCAP in the last 168 hours. Lipid Profile: Recent Labs    09/09/19 0234  TRIG 35   Thyroid Function Tests: No results for input(s): TSH, T4TOTAL, FREET4, T3FREE, THYROIDAB in the last 72 hours. Anemia Panel: Recent Labs    09/09/19 0250 09/10/19 0055  FERRITIN 45 69   Sepsis Labs: Recent Labs  Lab 09/09/19 0249 09/09/19 0250  PROCALCITON  --  0.25  LATICACIDVEN 1.2  --     Recent Results (from the past  240 hour(s))  Blood Culture (routine x 2)     Status: None (Preliminary result)   Collection Time: 09/09/19  2:49 AM   Specimen: BLOOD  Result Value Ref Range Status   Specimen Description BLOOD LEFT ANTECUBITAL  Final   Special Requests   Final    BOTTLES DRAWN AEROBIC AND ANAEROBIC Blood Culture adequate volume   Culture   Final    NO GROWTH 1 DAY Performed at [facility], [address], [place], [place] [address]    Report Status PENDING  Incomplete  Blood Culture (routine x 2)     Status: None (Preliminary result)   Collection Time: 09/09/19  3:00 AM   Specimen: BLOOD RIGHT HAND  Result Value Ref Range Status   Specimen Description BLOOD RIGHT HAND  Final   Special Requests   Final    BOTTLES DRAWN AEROBIC AND ANAEROBIC Blood Culture adequate volume  Culture   Final    NO GROWTH 1 DAY Performed at [facility], [address], [place], [place] [address]    Report Status PENDING  Incomplete         Radiology Studies: DG Chest Port 1 View  Result Date: 09/09/2019 CLINICAL DATA:  Shortness of breath, chills, nausea, vomiting and cough, fever EXAM: PORTABLE CHEST 1 VIEW COMPARISON:  Radiograph 07/28/2015 FINDINGS: Multifocal patchy interstitial and airspace opacity throughout the lungs with some peripheral predominance. No pneumothorax. No visible effusion the portions of the left costophrenic sulcus are collimated. There is cardiomegaly which may be accentuated by portable technique. No acute osseous or soft tissue abnormality. IMPRESSION: 1. Multifocal patchy interstitial and airspace opacities throughout the lungs with some peripheral predominance. Findings are suspicious for multifocal pneumonia. Electronically Signed   By: Lovena Le M.D.   On: 09/09/2019 02:20        Scheduled Meds: . allopurinol  300 mg Oral Daily  . aspirin EC  81 mg Oral Daily  . atorvastatin  10 mg Oral Daily  . clopidogrel  75 mg Oral Daily  . dexamethasone (DECADRON) injection  6 mg  Intravenous Q24H  . enoxaparin (LOVENOX) injection  80 mg Subcutaneous Q24H  . furosemide  40 mg Oral BID  . hydrALAZINE  50 mg Oral TID  . hydrochlorothiazide  25 mg Oral Daily  . irbesartan  300 mg Oral Daily  . mouth rinse  15 mL Mouth Rinse BID  . metoprolol succinate  100 mg Oral Daily  . sodium chloride flush  3 mL Intravenous Q12H  . sodium chloride flush  3 mL Intravenous Q12H   Continuous Infusions: . sodium chloride    .  ceFAZolin (ANCEF) IV 2 g (09/10/19 0911)  . remdesivir 100 mg in NS 100 mL 100 mg (09/10/19 1030)     LOS: 1 day    Time spent: 30 minutes    Barb Merino, MD [facility] Pager [phone]

## 2019-09-10 NOTE — Progress Notes (Signed)
Wound Care Dressing Change Complete: Removed home dressings, uploaded wound pictures to patient media, followed WOCN instructions for new dressings. Xeroform not available at time of dressing change (now in room and able to be used when dressing is changed tomorrow). Washed removing as much dry flaky skin as possible, lotion applied, kerlex/ACE wrap applied. Patient tolerated well.

## 2019-09-10 NOTE — Progress Notes (Signed)
Pt refused to have wraps removed from BLE this shift. States he will let RN take them off and assess in am. Educated pt the importance and purpose of the assessment and he still refused to allow it this evening. Will attempt again with final assessment this shift.

## 2019-09-11 LAB — CBC WITH DIFFERENTIAL/PLATELET
Abs Immature Granulocytes: 0.07 10*3/uL (ref 0.00–0.07)
Basophils Absolute: 0 10*3/uL (ref 0.0–0.1)
Basophils Relative: 0 %
Eosinophils Absolute: 0 10*3/uL (ref 0.0–0.5)
Eosinophils Relative: 0 %
HCT: 42.5 % (ref 39.0–52.0)
Hemoglobin: 13 g/dL (ref 13.0–17.0)
Immature Granulocytes: 1 %
Lymphocytes Relative: 4 %
Lymphs Abs: 0.4 10*3/uL — ABNORMAL LOW (ref 0.7–4.0)
MCH: 24.5 pg — ABNORMAL LOW (ref 26.0–34.0)
MCHC: 30.6 g/dL (ref 30.0–36.0)
MCV: 80 fL (ref 80.0–100.0)
Monocytes Absolute: 1.1 10*3/uL — ABNORMAL HIGH (ref 0.1–1.0)
Monocytes Relative: 11 %
Neutro Abs: 8.6 10*3/uL — ABNORMAL HIGH (ref 1.7–7.7)
Neutrophils Relative %: 84 %
Platelets: 191 10*3/uL (ref 150–400)
RBC: 5.31 MIL/uL (ref 4.22–5.81)
RDW: 18.3 % — ABNORMAL HIGH (ref 11.5–15.5)
WBC: 10.3 10*3/uL (ref 4.0–10.5)
nRBC: 0 % (ref 0.0–0.2)

## 2019-09-11 LAB — COMPREHENSIVE METABOLIC PANEL
ALT: 15 U/L (ref 0–44)
AST: 18 U/L (ref 15–41)
Albumin: 3.2 g/dL — ABNORMAL LOW (ref 3.5–5.0)
Alkaline Phosphatase: 57 U/L (ref 38–126)
Anion gap: 14 (ref 5–15)
BUN: 32 mg/dL — ABNORMAL HIGH (ref 8–23)
CO2: 30 mmol/L (ref 22–32)
Calcium: 8.7 mg/dL — ABNORMAL LOW (ref 8.9–10.3)
Chloride: 91 mmol/L — ABNORMAL LOW (ref 98–111)
Creatinine, Ser: 0.97 mg/dL (ref 0.61–1.24)
GFR calc Af Amer: >60 mL/min (ref >60)
GFR calc non Af Amer: >60 mL/min (ref >60)
Glucose, Bld: 183 mg/dL — ABNORMAL HIGH (ref 70–99)
Potassium: 3.6 mmol/L (ref 3.5–5.1)
Sodium: 135 mmol/L (ref 135–145)
Total Bilirubin: 0.7 mg/dL (ref 0.3–1.2)
Total Protein: 6.9 g/dL (ref 6.5–8.1)

## 2019-09-11 LAB — C-REACTIVE PROTEIN: CRP: 16.2 mg/dL — ABNORMAL HIGH (ref <1.0)

## 2019-09-11 LAB — FERRITIN: Ferritin: 91 ng/mL (ref 24–336)

## 2019-09-11 LAB — D-DIMER, QUANTITATIVE: D-Dimer, Quant: 0.32 ug/mL-FEU (ref 0.00–0.50)

## 2019-09-11 LAB — MAGNESIUM: Magnesium: 1.8 mg/dL (ref 1.7–2.4)

## 2019-09-11 MED ORDER — MAGNESIUM OXIDE 400 (241.3 MG) MG PO TABS
400.0000 mg | ORAL_TABLET | Freq: Two times a day (BID) | ORAL | Status: DC
  Administered 2019-09-11 – 2019-09-17 (×13): 400 mg via ORAL
  Filled 2019-09-11 (×13): qty 1

## 2019-09-11 NOTE — Plan of Care (Signed)

## 2019-09-11 NOTE — Progress Notes (Signed)
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  Patient had a started symptoms on 09/05/2019.  In the emergency room, he was found to be saturating 80% on room air, treated with 6 L of nasal cannula oxygen and admitted to the hospital.   Assessment & Plan:   Principal Problem:   Pneumonia due to COVID-19 virus Active Problems:   Venous stasis ulcers (HCC)   Essential hypertension   Chronic diastolic heart failure (HCC)   Hypokalemia   Acute respiratory failure with hypoxia ([place])  pneumonia due to COVID-19 virus with acute hypoxemic respiratory failure: Continue to monitor due to significant symptoms, still on supplemental oxygen. chest physiotherapy, incentive spirometry, deep breathing exercises, sputum induction, mucolytic's and bronchodilators. Supplemental oxygen to keep saturations more than 90%. Covid directed therapy with , steroids, dexamethasone 6 mg IV daily day 3 remdesivir, day 3/5 antibiotics, not indicated for Covid, indicated for leg cellulitis. Due to severity of symptoms, patient will need daily inflammatory markers, liver function test to monitor and direct COVID-19 therapies.  Chronic venous stasis wound with superadded infection: Extensive venous stasis wound.  Wound care consult.  Dressing changes. We will continue Ancef for total 5 days.  Will need aggressive wound care.  Chronic diastolic heart failure: Appears compensated.  Continue Lasix, beta-blockers and ARB.  Hypertension: Blood pressures are stable.  Hypokalemia: Potassium replaced with improvement.  Replace magnesium.   DVT  prophylaxis: Lovenox subcu Code Status: Full code Family Communication: None Disposition Plan: Remains inpatient   Consultants:   None, wound care  Procedures:   Wound care  Antimicrobials:  Anti-infectives (From admission, onward)   Start     Dose/Rate Route Frequency Ordered Stop   09/10/19 1000  remdesivir 100 mg in sodium chloride 0.9 % 100 mL IVPB     100 mg 200 mL/hr over 30 Minutes Intravenous Daily 09/09/19 0248 09/14/19 0959   09/09/19 1700  ceFAZolin (ANCEF) IVPB 2g/100 mL premix     2 g 200 mL/hr over 30 Minutes Intravenous Every 8 hours 09/09/19 1611     09/09/19 0600  remdesivir 200 mg in sodium chloride 0.9% 250 mL IVPB     200 mg 580 mL/hr over 30 Minutes Intravenous Once 09/09/19 0248 09/09/19 0441         Subjective: Patient seen and examined.  No overnight events.  He does have obstructive sleep apnea but does not use CPAP.  He himself feels okay.  Has not ambulated much, however remains afebrile and able to move from bed to chair.  Objective: Vitals:   09/10/19 1915 09/11/19 0000 09/11/19 0400 09/11/19 0452  BP: (!) 167/98 (!) 160/75  124/69  Pulse: 66   71  Resp: 18   20  Temp: 98.5 F (36.9 C) 98.6 F (37 C) 99.8 F (37.7 C) 99.2 F (37.3 C)  TempSrc: Oral Oral Oral Oral  SpO2: 94%   91%  Weight:      Height:        Intake/Output Summary (Last 24 hours) at 09/11/2019 1120 Last data filed at 09/10/2019 2200 Gross per 24 hour  Intake 720 ml  Output 2550 ml  Net -1830 ml  Filed Weights   09/09/19 0153  Weight: (!) 163.3 kg    Examination:  General exam: Appears calm and comfortable, anxious, on 1-2 L and comfortable. Respiratory system: Clear to auscultation. Respiratory effort normal.  On 2 L oxygen.  No added sound. Cardiovascular system: S1 & S2 heard, RRR. No JVD, murmurs, rubs, gallops or clicks.  Extensive venous stasis wound. Gastrointestinal system: Abdomen is nondistended, soft and nontender. No organomegaly or masses  felt. Normal bowel sounds heard.  Obese and pendulous. Central nervous system: Alert and oriented. No focal neurological deficits. Extremities: Symmetric 5 x 5 power. Skin: No rashes, lesions or ulcers Psychiatry: Judgement and insight appear normal. Mood & affect anxious. Extensive venous stasis wound bilateral leg with some areas of redness and drainage, no fluctuation or induration.             Data Reviewed: I have personally reviewed following labs and imaging studies  CBC: Recent Labs  Lab 09/09/19 0234 09/10/19 0055 09/11/19 0235  WBC 15.1* 14.7* 10.3  NEUTROABS 13.4* 13.1* 8.6*  HGB 12.5* 13.0 13.0  HCT 42.1 43.5 42.5  MCV 81.4 81.2 80.0  PLT 178 190 [id number]   Basic Metabolic Panel: Recent Labs  Lab 09/09/19 0234 09/10/19 0055 09/11/19 0235  NA 135 138 135  K 3.2* 4.2 3.6  CL 98 97* 91*  CO2 26 29 30   GLUCOSE 164* 147* 183*  BUN 23 26* 32*  CREATININE 0.88 1.02 0.97  CALCIUM 8.3* 8.8* 8.7*  MG  --  1.8 1.8   GFR: Estimated Creatinine Clearance: 118.7 mL/min (by C-G formula based on SCr of 0.97 mg/dL). Liver Function Tests: Recent Labs  Lab 09/09/19 0234 09/10/19 0055 09/11/19 0235  AST 16 17 18   ALT 19 16 15   ALKPHOS 53 55 57  BILITOT 1.0 1.0 0.7  PROT 6.8 7.0 6.9  ALBUMIN 3.3* 3.3* 3.2*   No results for input(s): LIPASE, AMYLASE in the last 168 hours. No results for input(s): AMMONIA in the last 168 hours. Coagulation Profile: No results for input(s): INR, PROTIME in the last 168 hours. Cardiac Enzymes: No results for input(s): CKTOTAL, CKMB, CKMBINDEX, TROPONINI in the last 168 hours. BNP (last 3 results) No results for input(s): PROBNP in the last 8760 hours. HbA1C: No results for input(s): HGBA1C in the last 72 hours. CBG: No results for input(s): GLUCAP in the last 168 hours. Lipid Profile: Recent Labs    09/09/19 0234  TRIG 35   Thyroid Function Tests: No results for input(s): TSH, T4TOTAL, FREET4, T3FREE, THYROIDAB in the  last 72 hours. Anemia Panel: Recent Labs    09/10/19 0055 09/11/19 0235  FERRITIN 69 91   Sepsis Labs: Recent Labs  Lab 09/09/19 0249 09/09/19 0250  PROCALCITON  --  0.25  LATICACIDVEN 1.2  --     Recent Results (from the past 240 hour(s))  Blood Culture (routine x 2)     Status: None (Preliminary result)   Collection Time: 09/09/19  2:49 AM   Specimen: BLOOD  Result Value Ref Range Status   Specimen Description BLOOD LEFT ANTECUBITAL  Final   Special Requests   Final    BOTTLES DRAWN AEROBIC AND ANAEROBIC Blood Culture adequate volume   Culture   Final    NO GROWTH 2 DAYS Performed at [facility], [address], [place], [place] [address]    Report Status PENDING  Incomplete  Blood Culture (routine x 2)     Status: None (Preliminary result)   Collection Time:  09/09/19  3:00 AM   Specimen: BLOOD RIGHT HAND  Result Value Ref Range Status   Specimen Description BLOOD RIGHT HAND  Final   Special Requests   Final    BOTTLES DRAWN AEROBIC AND ANAEROBIC Blood Culture adequate volume   Culture   Final    NO GROWTH 2 DAYS Performed at [facility], [address], [place], [place] [address]    Report Status PENDING  Incomplete         Radiology Studies: No results found.      Scheduled Meds: . allopurinol  300 mg Oral Daily  . aspirin EC  81 mg Oral Daily  . atorvastatin  10 mg Oral Daily  . clopidogrel  75 mg Oral Daily  . dexamethasone (DECADRON) injection  6 mg Intravenous Q24H  . enoxaparin (LOVENOX) injection  80 mg Subcutaneous Q24H  . furosemide  40 mg Oral BID  . hydrALAZINE  50 mg Oral TID  . hydrochlorothiazide  25 mg Oral Daily  . irbesartan  300 mg Oral Daily  . magnesium oxide  400 mg Oral BID  . mouth rinse  15 mL Mouth Rinse BID  . metoprolol succinate  100 mg Oral Daily  . sodium chloride flush  3 mL Intravenous Q12H  . sodium chloride flush  3 mL Intravenous Q12H   Continuous Infusions: . sodium chloride    .  ceFAZolin (ANCEF) IV 2 g  (09/11/19 0935)  . remdesivir 100 mg in NS 100 mL 100 mg (09/11/19 1029)     LOS: 2 days    Time spent: 30 minutes    Barb Merino, MD [facility] Pager [phone]

## 2019-09-12 LAB — CBC WITH DIFFERENTIAL/PLATELET
Abs Immature Granulocytes: 0.12 10*3/uL — ABNORMAL HIGH (ref 0.00–0.07)
Basophils Absolute: 0 10*3/uL (ref 0.0–0.1)
Basophils Relative: 0 %
Eosinophils Absolute: 0 10*3/uL (ref 0.0–0.5)
Eosinophils Relative: 0 %
HCT: 41.9 % (ref 39.0–52.0)
Hemoglobin: 12.6 g/dL — ABNORMAL LOW (ref 13.0–17.0)
Immature Granulocytes: 1 %
Lymphocytes Relative: 3 %
Lymphs Abs: 0.2 10*3/uL — ABNORMAL LOW (ref 0.7–4.0)
MCH: 24 pg — ABNORMAL LOW (ref 26.0–34.0)
MCHC: 30.1 g/dL (ref 30.0–36.0)
MCV: 79.8 fL — ABNORMAL LOW (ref 80.0–100.0)
Monocytes Absolute: 0.4 10*3/uL (ref 0.1–1.0)
Monocytes Relative: 5 %
Neutro Abs: 7.8 10*3/uL — ABNORMAL HIGH (ref 1.7–7.7)
Neutrophils Relative %: 91 %
Platelets: 230 10*3/uL (ref 150–400)
RBC: 5.25 MIL/uL (ref 4.22–5.81)
RDW: 17.9 % — ABNORMAL HIGH (ref 11.5–15.5)
WBC: 8.6 10*3/uL (ref 4.0–10.5)
nRBC: 0 % (ref 0.0–0.2)

## 2019-09-12 LAB — HEMOGLOBIN A1C
Hgb A1c MFr Bld: 6.5 % — ABNORMAL HIGH (ref 4.8–5.6)
Mean Plasma Glucose: 139.85 mg/dL

## 2019-09-12 LAB — COMPREHENSIVE METABOLIC PANEL
ALT: 23 U/L (ref 0–44)
AST: 26 U/L (ref 15–41)
Albumin: 2.9 g/dL — ABNORMAL LOW (ref 3.5–5.0)
Alkaline Phosphatase: 54 U/L (ref 38–126)
Anion gap: 15 (ref 5–15)
BUN: 38 mg/dL — ABNORMAL HIGH (ref 8–23)
CO2: 28 mmol/L (ref 22–32)
Calcium: 8.2 mg/dL — ABNORMAL LOW (ref 8.9–10.3)
Chloride: 91 mmol/L — ABNORMAL LOW (ref 98–111)
Creatinine, Ser: 1.16 mg/dL (ref 0.61–1.24)
GFR calc Af Amer: >60 mL/min (ref >60)
GFR calc non Af Amer: >60 mL/min (ref >60)
Glucose, Bld: 299 mg/dL — ABNORMAL HIGH (ref 70–99)
Potassium: 3.5 mmol/L (ref 3.5–5.1)
Sodium: 134 mmol/L — ABNORMAL LOW (ref 135–145)
Total Bilirubin: 0.6 mg/dL (ref 0.3–1.2)
Total Protein: 6.5 g/dL (ref 6.5–8.1)

## 2019-09-12 LAB — D-DIMER, QUANTITATIVE: D-Dimer, Quant: 0.38 ug/mL-FEU (ref 0.00–0.50)

## 2019-09-12 LAB — GLUCOSE, CAPILLARY
Glucose-Capillary: 250 mg/dL — ABNORMAL HIGH (ref 70–99)
Glucose-Capillary: 302 mg/dL — ABNORMAL HIGH (ref 70–99)
Glucose-Capillary: 178 mg/dL — ABNORMAL HIGH (ref 70–99)
Glucose-Capillary: 184 mg/dL — ABNORMAL HIGH (ref 70–99)

## 2019-09-12 LAB — FERRITIN: Ferritin: 98 ng/mL (ref 24–336)

## 2019-09-12 LAB — C-REACTIVE PROTEIN: CRP: 14.6 mg/dL — ABNORMAL HIGH (ref <1.0)

## 2019-09-12 LAB — MAGNESIUM: Magnesium: 1.9 mg/dL (ref 1.7–2.4)

## 2019-09-12 MED ORDER — INSULIN ASPART 100 UNIT/ML ~~LOC~~ SOLN
0.0000 [IU] | Freq: Three times a day (TID) | SUBCUTANEOUS | Status: DC
  Administered 2019-09-12 – 2019-09-13 (×4): 3 [IU] via SUBCUTANEOUS
  Administered 2019-09-14: 5 [IU] via SUBCUTANEOUS
  Administered 2019-09-14: 17:00:00 2 [IU] via SUBCUTANEOUS
  Administered 2019-09-14 – 2019-09-15 (×3): 5 [IU] via SUBCUTANEOUS
  Administered 2019-09-15: 8 [IU] via SUBCUTANEOUS
  Administered 2019-09-16: 3 [IU] via SUBCUTANEOUS
  Administered 2019-09-16 – 2019-09-17 (×3): 2 [IU] via SUBCUTANEOUS

## 2019-09-12 MED ORDER — INSULIN ASPART 100 UNIT/ML ~~LOC~~ SOLN
0.0000 [IU] | Freq: Every day | SUBCUTANEOUS | Status: DC
  Administered 2019-09-15: 21:00:00 2 [IU] via SUBCUTANEOUS

## 2019-09-12 NOTE — Progress Notes (Signed)
PROGRESS NOTE    [name] [name]  [name] DOB: [birth year] DOA: 09/09/2019 PCP: Manon Hilding, MD    Brief Narrative:  Patient is a 63 year old male with history of occupational asbestos exposure, chronic diastolic heart failure, hypertension, history of TIA, chronic venous stasis ulcers and obesity presented to the emergency department for evaluation of chills, nausea, vomiting and shortness of breath.  Patient had a started symptoms on 09/05/2019.  In the emergency room, he was found to be saturating 80% on room air, treated with 6 L of nasal cannula oxygen and admitted to the hospital.   Assessment & Plan:   Principal Problem:   Pneumonia due to COVID-19 virus Active Problems:   Venous stasis ulcers (HCC)   Essential hypertension   Chronic diastolic heart failure (HCC)   Hypokalemia   Acute respiratory failure with hypoxia ([place])  pneumonia due to COVID-19 virus with acute hypoxemic respiratory failure: Continue to monitor due to significant symptoms, still requiring supplemental oxygen. chest physiotherapy, incentive spirometry, deep breathing exercises, sputum induction, mucolytic's and bronchodilators. Supplemental oxygen to keep saturations more than 90%. Covid directed therapy with , steroids, dexamethasone 6 mg IV daily day 4 remdesivir, day 4/5 antibiotics, not indicated for Covid, indicated for leg cellulitis.  Chronic venous stasis wound with superadded infection: Extensive venous stasis wound.  Wound care consult.  Dressing changes. We will continue Ancef for total 5 days.  Will need aggressive wound care.  Chronic diastolic heart failure: Appears compensated.  Continue Lasix, beta-blockers and ARB.  Hypertension: Blood pressures are stable.  Hypomagnesemia/hypokalemia: Replaced.    Obstructive sleep apnea: Not using CPAP at home.  Advance activities.  Ambulate.  DVT prophylaxis: Lovenox subcu Code Status: Full code Family Communication:  None Disposition Plan: Remains inpatient   Consultants:   None, wound care  Procedures:   Wound care  Antimicrobials:  Anti-infectives (From admission, onward)   Start     Dose/Rate Route Frequency Ordered Stop   09/10/19 1000  remdesivir 100 mg in sodium chloride 0.9 % 100 mL IVPB     100 mg 200 mL/hr over 30 Minutes Intravenous Daily 09/09/19 0248 09/14/19 0959   09/09/19 1700  ceFAZolin (ANCEF) IVPB 2g/100 mL premix     2 g 200 mL/hr over 30 Minutes Intravenous Every 8 hours 09/09/19 1611     09/09/19 0600  remdesivir 200 mg in sodium chloride 0.9% 250 mL IVPB     200 mg 580 mL/hr over 30 Minutes Intravenous Once 09/09/19 0248 09/09/19 0441         Subjective: Seen and examined.  No overnight events.  He feels his breathing is getting better.  Still requiring 2 to 3 L of oxygen.  No cough no fever. Objective: Vitals:   09/11/19 1751 09/11/19 1944 09/12/19 0054 09/12/19 0801  BP: (!) 156/80 (!) 157/74 136/79 134/87  Pulse:  73 73 70  Resp:  (!) 23 (!) 32 20  Temp:  98 F (36.7 C) 99.4 F (37.4 C) 97.7 F (36.5 C)  TempSrc:  Oral Oral Oral  SpO2:  91% (!) 87% 92%  Weight:      Height:        Intake/Output Summary (Last 24 hours) at 09/12/2019 1140 Last data filed at 09/12/2019 1041 Gross per 24 hour  Intake 363 ml  Output 1300 ml  Net -937 ml   Filed Weights   09/09/19 0153  Weight: (!) 163.3 kg    Examination:  General exam: Appears calm and comfortable, sitting  in chair, on 2 L and comfortable. Respiratory system: Clear to auscultation. Respiratory effort normal.  On 2 L oxygen.  No added sound. Cardiovascular system: S1 & S2 heard, RRR. No JVD, murmurs, rubs, gallops or clicks.  Extensive venous stasis wound. Gastrointestinal system: Abdomen is nondistended, soft and nontender. No organomegaly or masses felt. Normal bowel sounds heard.  Obese and pendulous. Central nervous system: Alert and oriented. No focal neurological deficits. Extremities:  Symmetric 5 x 5 power. Skin: No rashes, lesions or ulcers Psychiatry: Judgement and insight appear normal. Mood & affect anxious. Extensive venous stasis wound bilateral leg with some areas of redness and drainage, no fluctuation or induration. Dressing done.  Not removed today.  Data Reviewed: I have personally reviewed following labs and imaging studies  CBC: Recent Labs  Lab 09/09/19 0234 09/10/19 0055 09/11/19 0235  WBC 15.1* 14.7* 10.3  NEUTROABS 13.4* 13.1* 8.6*  HGB 12.5* 13.0 13.0  HCT 42.1 43.5 42.5  MCV 81.4 81.2 80.0  PLT 178 190 [id number]   Basic Metabolic Panel: Recent Labs  Lab 09/09/19 0234 09/10/19 0055 09/11/19 0235  NA 135 138 135  K 3.2* 4.2 3.6  CL 98 97* 91*  CO2 26 29 30   GLUCOSE 164* 147* 183*  BUN 23 26* 32*  CREATININE 0.88 1.02 0.97  CALCIUM 8.3* 8.8* 8.7*  MG  --  1.8 1.8   GFR: Estimated Creatinine Clearance: 118.7 mL/min (by C-G formula based on SCr of 0.97 mg/dL). Liver Function Tests: Recent Labs  Lab 09/09/19 0234 09/10/19 0055 09/11/19 0235  AST 16 17 18   ALT 19 16 15   ALKPHOS 53 55 57  BILITOT 1.0 1.0 0.7  PROT 6.8 7.0 6.9  ALBUMIN 3.3* 3.3* 3.2*   No results for input(s): LIPASE, AMYLASE in the last 168 hours. No results for input(s): AMMONIA in the last 168 hours. Coagulation Profile: No results for input(s): INR, PROTIME in the last 168 hours. Cardiac Enzymes: No results for input(s): CKTOTAL, CKMB, CKMBINDEX, TROPONINI in the last 168 hours. BNP (last 3 results) No results for input(s): PROBNP in the last 8760 hours. HbA1C: No results for input(s): HGBA1C in the last 72 hours. CBG: Recent Labs  Lab 09/12/19 0800  GLUCAP 250*   Lipid Profile: No results for input(s): CHOL, HDL, LDLCALC, TRIG, CHOLHDL, LDLDIRECT in the last 72 hours. Thyroid Function Tests: No results for input(s): TSH, T4TOTAL, FREET4, T3FREE, THYROIDAB in the last 72 hours. Anemia Panel: Recent Labs    09/10/19 0055 09/11/19 0235  FERRITIN 69  91   Sepsis Labs: Recent Labs  Lab 09/09/19 0249 09/09/19 0250  PROCALCITON  --  0.25  LATICACIDVEN 1.2  --     Recent Results (from the past 240 hour(s))  Blood Culture (routine x 2)     Status: None (Preliminary result)   Collection Time: 09/09/19  2:49 AM   Specimen: BLOOD  Result Value Ref Range Status   Specimen Description BLOOD LEFT ANTECUBITAL  Final   Special Requests   Final    BOTTLES DRAWN AEROBIC AND ANAEROBIC Blood Culture adequate volume   Culture   Final    NO GROWTH 3 DAYS Performed at [facility], [address] [place], [place] [address]    Report Status PENDING  Incomplete  Blood Culture (routine x 2)     Status: None (Preliminary result)   Collection Time: 09/09/19  3:00 AM   Specimen: BLOOD RIGHT HAND  Result Value Ref Range Status   Specimen Description BLOOD RIGHT  HAND  Final   Special Requests   Final    BOTTLES DRAWN AEROBIC AND ANAEROBIC Blood Culture adequate volume   Culture   Final    NO GROWTH 3 DAYS Performed at [facility], [address] [place], [place] [address]    Report Status PENDING  Incomplete         Radiology Studies: No results found.      Scheduled Meds: . allopurinol  300 mg Oral Daily  . aspirin EC  81 mg Oral Daily  . atorvastatin  10 mg Oral Daily  . clopidogrel  75 mg Oral Daily  . dexamethasone (DECADRON) injection  6 mg Intravenous Q24H  . enoxaparin (LOVENOX) injection  80 mg Subcutaneous Q24H  . furosemide  40 mg Oral BID  . hydrALAZINE  50 mg Oral TID  . hydrochlorothiazide  25 mg Oral Daily  . irbesartan  300 mg Oral Daily  . magnesium oxide  400 mg Oral BID  . mouth rinse  15 mL Mouth Rinse BID  . metoprolol succinate  100 mg Oral Daily  . sodium chloride flush  3 mL Intravenous Q12H  . sodium chloride flush  3 mL Intravenous Q12H   Continuous Infusions: . sodium chloride    .  ceFAZolin (ANCEF) IV 2 g (09/12/19 0842)  . remdesivir 100 mg in NS 100 mL 100 mg (09/12/19 1008)     LOS: 3  days    Time spent: 30 minutes    Barb Merino, MD [facility] Pager [phone]

## 2019-09-13 LAB — CBC WITH DIFFERENTIAL/PLATELET
Abs Immature Granulocytes: 0.19 10*3/uL — ABNORMAL HIGH (ref 0.00–0.07)
Basophils Absolute: 0 10*3/uL (ref 0.0–0.1)
Basophils Relative: 0 %
Eosinophils Absolute: 0.3 10*3/uL (ref 0.0–0.5)
Eosinophils Relative: 3 %
HCT: 44.5 % (ref 39.0–52.0)
Hemoglobin: 13.4 g/dL (ref 13.0–17.0)
Immature Granulocytes: 2 %
Lymphocytes Relative: 7 %
Lymphs Abs: 0.8 10*3/uL (ref 0.7–4.0)
MCH: 24.1 pg — ABNORMAL LOW (ref 26.0–34.0)
MCHC: 30.1 g/dL (ref 30.0–36.0)
MCV: 80 fL (ref 80.0–100.0)
Monocytes Absolute: 1.2 10*3/uL — ABNORMAL HIGH (ref 0.1–1.0)
Monocytes Relative: 11 %
Neutro Abs: 8.7 10*3/uL — ABNORMAL HIGH (ref 1.7–7.7)
Neutrophils Relative %: 77 %
Platelets: 222 10*3/uL (ref 150–400)
RBC: 5.56 MIL/uL (ref 4.22–5.81)
RDW: 18.3 % — ABNORMAL HIGH (ref 11.5–15.5)
WBC: 11.3 10*3/uL — ABNORMAL HIGH (ref 4.0–10.5)
nRBC: 0 % (ref 0.0–0.2)

## 2019-09-13 LAB — COMPREHENSIVE METABOLIC PANEL
ALT: 27 U/L (ref 0–44)
AST: 29 U/L (ref 15–41)
Albumin: 3 g/dL — ABNORMAL LOW (ref 3.5–5.0)
Alkaline Phosphatase: 59 U/L (ref 38–126)
Anion gap: 13 (ref 5–15)
BUN: 45 mg/dL — ABNORMAL HIGH (ref 8–23)
CO2: 33 mmol/L — ABNORMAL HIGH (ref 22–32)
Calcium: 8.9 mg/dL (ref 8.9–10.3)
Chloride: 92 mmol/L — ABNORMAL LOW (ref 98–111)
Creatinine, Ser: 1.12 mg/dL (ref 0.61–1.24)
GFR calc Af Amer: >60 mL/min (ref >60)
GFR calc non Af Amer: >60 mL/min (ref >60)
Glucose, Bld: 150 mg/dL — ABNORMAL HIGH (ref 70–99)
Potassium: 3.7 mmol/L (ref 3.5–5.1)
Sodium: 138 mmol/L (ref 135–145)
Total Bilirubin: 0.4 mg/dL (ref 0.3–1.2)
Total Protein: 6.9 g/dL (ref 6.5–8.1)

## 2019-09-13 LAB — C-REACTIVE PROTEIN: CRP: 14.7 mg/dL — ABNORMAL HIGH (ref <1.0)

## 2019-09-13 LAB — FERRITIN: Ferritin: 135 ng/mL (ref 24–336)

## 2019-09-13 LAB — GLUCOSE, CAPILLARY
Glucose-Capillary: 188 mg/dL — ABNORMAL HIGH (ref 70–99)
Glucose-Capillary: 151 mg/dL — ABNORMAL HIGH (ref 70–99)
Glucose-Capillary: 166 mg/dL — ABNORMAL HIGH (ref 70–99)

## 2019-09-13 LAB — MAGNESIUM: Magnesium: 2.3 mg/dL (ref 1.7–2.4)

## 2019-09-13 LAB — D-DIMER, QUANTITATIVE: D-Dimer, Quant: 0.28 ug/mL-FEU (ref 0.00–0.50)

## 2019-09-13 NOTE — Evaluation (Signed)
Physical Therapy Evaluation Patient Details Name: [name] [name] MRN: [id number] DOB: 21-Feb-1956 Today's Date: 09/13/2019   History of Present Illness  The history is provided by the patient. Shortness of BreathSeverity:  ModerateOnset quality:  GradualTiming:  IntermittentProgression:  WorseningChronicity:  NewRelieved by:  NothingWorsened by:  NothingAssociated symptoms: cough, fever and vomiting  Patient with history of asbestosis, hypertension, chronic lymphedema presents with shortness of breath.Patient arrives from home via [facility] reports he has had cough, fever, shortness of breath over the past 3-4 days.  He reports he was seen by his PCP last week and had a positive COVID-19 test.  He was placed on prednisone to try to manage this at home.  However tonight he became more short of breath and had vomitingPatient is not on chronic oxygen  Clinical Impression  63 year old male that presents from home with above noted symptoms. 33 year history of asbestos esposure with work. He is married and spouse is currently in SNF due to fall with subsequent fracture of a LE. He is generally weak , with poor postural awareness and poor safety awareness, and little insight into deficits. LE wounds of chronic status, and routinely goes to a wound care center. He should benefit from skilled PT to address strengthening, energy conservation, and gait safety. He tends to be impulsive. Using oxygen here, but has not been O2 dependent in home.  He could potentially benefit from bariatric [facility], but indicates he has all other necessary equipment. Of note, even though he states he is continent of BM, he was found to have soiled the blanket he was seated on-stating he "did not know it."    Follow Up Recommendations Home health PT-or SNF     Equipment Recommendations  (has all necessary equipment- although could potentially benefit from bariatric [facility])    Recommendations for Other Services        Precautions / Restrictions Precautions Precautions: Fall;Other (comment)(Does have wounds on both lower extremities- weeping through bulky dressing) Restrictions Weight Bearing Restrictions: No Other Position/Activity Restrictions: He is morbidly obese, easily fatigued and impulsive      Mobility  Bed Mobility Overal bed mobility: Needs Assistance                Transfers Overall transfer level: Needs assistance Equipment used: Rolling walker (2 wheeled) Transfers: Sit to/from Stand Sit to Stand: Mod assist         General transfer comment: Had difficulty arising from low surface  Ambulation/Gait Ambulation/Gait assistance: Min assist;Mod assist   Assistive device: Rolling walker (2 wheeled) Gait Pattern/deviations: Shuffle;Wide base of support;Antalgic        Stairs Stairs: (Did not attempt steps)          Information systems manager mobility: (Did not test- has power w/c at home)  Modified Rankin (Stroke Patients Only)       Balance Overall balance assessment: History of Falls;Needs assistance Sitting-balance support: Bilateral upper extremity supported Sitting balance-Leahy Scale: Fair     Standing balance support: Bilateral upper extremity supported Standing balance-Leahy Scale: Fair Standing balance comment: Had difficulty arising from low surface as well as returning to seated from standing                             Pertinent Vitals/Pain Pain Assessment: No/denies pain    Home Living Family/patient expects to be discharged to:: Private residence Living Arrangements: Spouse/significant other(Spouse is currently in a  SNF due to recent LE fractures)     Home Access: Ramped entrance     Home Layout: One level Home Equipment: Wheelchair - power;Walker - 2 wheels;Walker - 4 wheels;Cane - single point      Prior Function Level of Independence: Independent               Hand Dominance    Dominant Hand: Right    Extremity/Trunk Assessment   Upper Extremity Assessment Upper Extremity Assessment: Defer to OT evaluation    Lower Extremity Assessment Lower Extremity Assessment: Generalized weakness;RLE deficits/detail;LLE deficits/detail RLE Deficits / Details: Edematous RLE with wounds (weeping) outer wrap of ACE over bulky kerlex type dressing. He routinely goes ot wound care RLE Sensation: WNL LLE Deficits / Details: LLE edematous with bulky dressing, ace wrap-kerlex- wound does weep. Goes routinely to [name] LLE Sensation: WNL       Communication   Communication: No difficulties  Cognition                                              General Comments      Exercises General Exercises - Lower Extremity Ankle Circles/Pumps: AROM Gluteal Sets: AROM Short Arc Quad: AROM Long Arc Quad: AROM Other Exercises Other Exercises: instructed in flutter and inspirometer with return demo and encouragement to use each every hour for 8-10 breaths   Assessment/Plan    PT Assessment Patient needs continued PT services  PT Problem List Decreased strength;Decreased range of motion;Decreased mobility;Decreased balance;Decreased safety awareness;Decreased activity tolerance(Waddling gait- is obese with bulky dressings and ORTHO shoes both feet.)       PT Treatment Interventions DME instruction;Gait training;Functional mobility training;Therapeutic activities;Patient/family education    PT Goals (Current goals can be found in the Care Plan section)  Acute Rehab PT Goals Patient Stated Goal: To return to home as independent as possible -"like he was in PLOF for all ADLs"    Frequency Min 3X/week   Barriers to discharge        Co-evaluation PT/OT/SLP Co-Evaluation/Treatment: (No co- treat but assist of TWO for safety.)             AM-PAC PT "6 Clicks" Mobility  Outcome Measure Help needed turning from your back to your side while in a  flat bed without using bedrails?: A Little Help needed moving from lying on your back to sitting on the side of a flat bed without using bedrails?: A Little Help needed moving to and from a bed to a chair (including a wheelchair)?: A Little Help needed standing up from a chair using your arms (e.g., wheelchair or bedside chair)?: A Little Help needed to walk in hospital room?: A Little Help needed climbing 3-5 steps with a railing? : A Lot 6 Click Score: 17    End of Session Equipment Utilized During Treatment: Gait belt Activity Tolerance: Patient limited by fatigue;Other (comment)(Limited by low endurance and obesity) Patient left: in chair Nurse Communication: Mobility status PT Visit Diagnosis: Unsteadiness on feet (R26.81);Repeated falls (R29.6);Muscle weakness (generalized) (M62.81);History of falling (Z91.81)    Time: [id number] PT Time Calculation (min) (ACUTE ONLY): 30 min   Charges:   PT Evaluation $PT Eval Moderate Complexity: 1 Mod PT Treatments $Gait Training: 8-22 mins        Casandra Doffing, PT  Deloris Ping  Teresa Nicodemus 09/13/2019, 2:41 PM

## 2019-09-13 NOTE — Progress Notes (Signed)
PROGRESS NOTE    [name] [name]  [name] DOB: 06/12/1956 DOA: 09/09/2019 PCP: Manon Hilding, MD    Brief Narrative:  Patient is a 63 year old male with history of occupational asbestos exposure, chronic diastolic heart failure, hypertension, history of TIA, chronic venous stasis ulcers and obesity presented to the emergency department for evaluation of chills, nausea, vomiting and shortness of breath.  Patient had started symptoms on 09/05/2019.  In the emergency room, he was found to be saturating 80% on room air, treated with 6 L of nasal cannula oxygen and admitted to the hospital.   Assessment & Plan:   Principal Problem:   Pneumonia due to COVID-19 virus Active Problems:   Venous stasis ulcers (HCC)   Essential hypertension   Chronic diastolic heart failure (HCC)   Hypokalemia   Acute respiratory failure with hypoxia ([place])  pneumonia due to COVID-19 virus with acute hypoxemic respiratory failure: Continue to monitor due to significant symptoms, still requiring supplemental oxygen. chest physiotherapy, incentive spirometry, deep breathing exercises, sputum induction, mucolytic's and bronchodilators. Supplemental oxygen to keep saturations more than 90%. Covid directed therapy with , steroids, dexamethasone 6 mg IV daily day 5 remdesivir, day 5/5 antibiotics, not indicated for Covid, indicated for leg cellulitis.  Chronic venous stasis wound with superadded infection: Extensive venous stasis wound.  Wound care consult.  Dressing changes. We will continue Ancef for total 5 days.  Will need aggressive wound care.  Chronic diastolic heart failure: Appears compensated.  Continue Lasix, beta-blockers and ARB.  Hypertension: Blood pressures are stable.  Hypomagnesemia/hypokalemia: Replaced.    Obstructive sleep apnea: Not using CPAP at home.  Advance activities.  Ambulate.  Continue weaning attempts.  DVT prophylaxis: Lovenox subcu Code Status: Full code Family  Communication: None, stepdaughter on phone. Disposition Plan: Remains inpatient, still on oxygen.   Consultants:   None, wound care  Procedures:   Wound care  Antimicrobials:  Anti-infectives (From admission, onward)   Start     Dose/Rate Route Frequency Ordered Stop   09/10/19 1000  remdesivir 100 mg in sodium chloride 0.9 % 100 mL IVPB     100 mg 200 mL/hr over 30 Minutes Intravenous Daily 09/09/19 0248 09/13/19 1114   09/09/19 1700  ceFAZolin (ANCEF) IVPB 2g/100 mL premix     2 g 200 mL/hr over 30 Minutes Intravenous Every 8 hours 09/09/19 1611     09/09/19 0600  remdesivir 200 mg in sodium chloride 0.9% 250 mL IVPB     200 mg 580 mL/hr over 30 Minutes Intravenous Once 09/09/19 0248 09/09/19 0441         Subjective: Seen and examined.  No overnight events.  Afebrile.  88% on 3 L oxygen.  Patient was short of breath on ambulation, however he tells me that this is how he is all the time.  He does not use his CPAP at home.  Desperately wants to go home. Objective: Vitals:   09/13/19 0400 09/13/19 0500 09/13/19 0841 09/13/19 1204  BP:  137/77 (!) 147/83 (!) 148/73  Pulse: (!) 59 (!) 58 73 78  Resp: (!) 25 (!) 25 18 (!) 24  Temp:  98.2 F (36.8 C) 98.2 F (36.8 C)   TempSrc:  Oral Axillary   SpO2: 92% 92% (!) 88% (!) 89%  Weight:      Height:        Intake/Output Summary (Last 24 hours) at 09/13/2019 1218 Last data filed at 09/13/2019 1056 Gross per 24 hour  Intake 543 ml  Output 1075 ml  Net -532 ml   Filed Weights   09/09/19 0153  Weight: (!) 163.3 kg    Examination:  General exam: Appears calm and comfortable, sitting in chair, on 3 L and with mild respiratory distress. Respiratory system: Clear to auscultation. Respiratory effort mildly abnormal.  On 3 L oxygen No added sound. Cardiovascular system: S1 & S2 heard, RRR. No JVD, murmurs, rubs, gallops or clicks.  Extensive venous stasis wound. Gastrointestinal system: Abdomen is nondistended, soft and  nontender. No organomegaly or masses felt. Normal bowel sounds heard.  Obese and pendulous. Central nervous system: Alert and oriented. No focal neurological deficits. Extremities: Symmetric 5 x 5 power. Skin: No rashes, lesions or ulcers Psychiatry: Judgement and insight appear normal. Mood & affect anxious. Extensive venous stasis wound bilateral leg with some areas of redness and drainage, no fluctuation or induration. Dressing done.  Not removed today.  Data Reviewed: I have personally reviewed following labs and imaging studies  CBC: Recent Labs  Lab 09/09/19 0234 09/10/19 0055 09/11/19 0235 09/12/19 1213 09/13/19 0447  WBC 15.1* 14.7* 10.3 8.6 11.3*  NEUTROABS 13.4* 13.1* 8.6* 7.8* 8.7*  HGB 12.5* 13.0 13.0 12.6* 13.4  HCT 42.1 43.5 42.5 41.9 44.5  MCV 81.4 81.2 80.0 79.8* 80.0  PLT 178 190 191 230 [id number]   Basic Metabolic Panel: Recent Labs  Lab 09/09/19 0234 09/10/19 0055 09/11/19 0235 09/12/19 1213 09/13/19 0447  NA 135 138 135 134* 138  K 3.2* 4.2 3.6 3.5 3.7  CL 98 97* 91* 91* 92*  CO2 26 29 30 28  33*  GLUCOSE 164* 147* 183* 299* 150*  BUN 23 26* 32* 38* 45*  CREATININE 0.88 1.02 0.97 1.16 1.12  CALCIUM 8.3* 8.8* 8.7* 8.2* 8.9  MG  --  1.8 1.8 1.9 2.3   GFR: Estimated Creatinine Clearance: 102.8 mL/min (by C-G formula based on SCr of 1.12 mg/dL). Liver Function Tests: Recent Labs  Lab 09/09/19 0234 09/10/19 0055 09/11/19 0235 09/12/19 1213 09/13/19 0447  AST 16 17 18 26 29   ALT 19 16 15 23 27   ALKPHOS 53 55 57 54 59  BILITOT 1.0 1.0 0.7 0.6 0.4  PROT 6.8 7.0 6.9 6.5 6.9  ALBUMIN 3.3* 3.3* 3.2* 2.9* 3.0*   No results for input(s): LIPASE, AMYLASE in the last 168 hours. No results for input(s): AMMONIA in the last 168 hours. Coagulation Profile: No results for input(s): INR, PROTIME in the last 168 hours. Cardiac Enzymes: No results for input(s): CKTOTAL, CKMB, CKMBINDEX, TROPONINI in the last 168 hours. BNP (last 3 results) No results for  input(s): PROBNP in the last 8760 hours. HbA1C: Recent Labs    09/12/19 1213  HGBA1C 6.5*   CBG: Recent Labs  Lab 09/12/19 0800 09/12/19 1200 09/12/19 1740 09/12/19 2003  GLUCAP 250* 302* 178* 184*   Lipid Profile: No results for input(s): CHOL, HDL, LDLCALC, TRIG, CHOLHDL, LDLDIRECT in the last 72 hours. Thyroid Function Tests: No results for input(s): TSH, T4TOTAL, FREET4, T3FREE, THYROIDAB in the last 72 hours. Anemia Panel: Recent Labs    09/12/19 1213 09/13/19 0447  FERRITIN 98 135   Sepsis Labs: Recent Labs  Lab 09/09/19 0249 09/09/19 0250  PROCALCITON  --  0.25  LATICACIDVEN 1.2  --     Recent Results (from the past 240 hour(s))  Blood Culture (routine x 2)     Status: None (Preliminary result)   Collection Time: 09/09/19  2:49 AM   Specimen: BLOOD  Result Value Ref Range Status  Specimen Description BLOOD LEFT ANTECUBITAL  Final   Special Requests   Final    BOTTLES DRAWN AEROBIC AND ANAEROBIC Blood Culture adequate volume   Culture   Final    NO GROWTH 4 DAYS Performed at [facility], [address] [place], [place] [address]    Report Status PENDING  Incomplete  Blood Culture (routine x 2)     Status: None (Preliminary result)   Collection Time: 09/09/19  3:00 AM   Specimen: BLOOD RIGHT HAND  Result Value Ref Range Status   Specimen Description BLOOD RIGHT HAND  Final   Special Requests   Final    BOTTLES DRAWN AEROBIC AND ANAEROBIC Blood Culture adequate volume   Culture   Final    NO GROWTH 4 DAYS Performed at [facility], [address], [place], [place] [address]    Report Status PENDING  Incomplete         Radiology Studies: No results found.      Scheduled Meds: . allopurinol  300 mg Oral Daily  . aspirin EC  81 mg Oral Daily  . atorvastatin  10 mg Oral Daily  . clopidogrel  75 mg Oral Daily  . dexamethasone (DECADRON) injection  6 mg Intravenous Q24H  . enoxaparin (LOVENOX) injection  80 mg Subcutaneous Q24H  .  furosemide  40 mg Oral BID  . hydrALAZINE  50 mg Oral TID  . hydrochlorothiazide  25 mg Oral Daily  . insulin aspart  0-15 Units Subcutaneous TID WC  . insulin aspart  0-5 Units Subcutaneous QHS  . irbesartan  300 mg Oral Daily  . magnesium oxide  400 mg Oral BID  . mouth rinse  15 mL Mouth Rinse BID  . metoprolol succinate  100 mg Oral Daily  . sodium chloride flush  3 mL Intravenous Q12H  . sodium chloride flush  3 mL Intravenous Q12H   Continuous Infusions: . sodium chloride    .  ceFAZolin (ANCEF) IV 2 g (09/13/19 0859)     LOS: 4 days    Time spent: 30 minutes    Barb Merino, MD [facility] Pager [phone]

## 2019-09-14 ENCOUNTER — Inpatient Hospital Stay (HOSPITAL_COMMUNITY): Payer: Medicare Other

## 2019-09-14 LAB — COMPREHENSIVE METABOLIC PANEL
ALT: 25 U/L (ref 0–44)
AST: 27 U/L (ref 15–41)
Albumin: 2.9 g/dL — ABNORMAL LOW (ref 3.5–5.0)
Alkaline Phosphatase: 66 U/L (ref 38–126)
Anion gap: 13 (ref 5–15)
BUN: 43 mg/dL — ABNORMAL HIGH (ref 8–23)
CO2: 32 mmol/L (ref 22–32)
Calcium: 8.5 mg/dL — ABNORMAL LOW (ref 8.9–10.3)
Chloride: 88 mmol/L — ABNORMAL LOW (ref 98–111)
Creatinine, Ser: 1.13 mg/dL (ref 0.61–1.24)
GFR calc Af Amer: >60 mL/min (ref >60)
GFR calc non Af Amer: >60 mL/min (ref >60)
Glucose, Bld: 177 mg/dL — ABNORMAL HIGH (ref 70–99)
Potassium: 3.7 mmol/L (ref 3.5–5.1)
Sodium: 133 mmol/L — ABNORMAL LOW (ref 135–145)
Total Bilirubin: 0.6 mg/dL (ref 0.3–1.2)
Total Protein: 6.8 g/dL (ref 6.5–8.1)

## 2019-09-14 LAB — CBC WITH DIFFERENTIAL/PLATELET
Abs Immature Granulocytes: 0.23 10*3/uL — ABNORMAL HIGH (ref 0.00–0.07)
Basophils Absolute: 0.1 10*3/uL (ref 0.0–0.1)
Basophils Relative: 1 %
Eosinophils Absolute: 0.3 10*3/uL (ref 0.0–0.5)
Eosinophils Relative: 2 %
HCT: 42.5 % (ref 39.0–52.0)
Hemoglobin: 12.9 g/dL — ABNORMAL LOW (ref 13.0–17.0)
Immature Granulocytes: 2 %
Lymphocytes Relative: 5 %
Lymphs Abs: 0.7 10*3/uL (ref 0.7–4.0)
MCH: 24.2 pg — ABNORMAL LOW (ref 26.0–34.0)
MCHC: 30.4 g/dL (ref 30.0–36.0)
MCV: 79.6 fL — ABNORMAL LOW (ref 80.0–100.0)
Monocytes Absolute: 1.3 10*3/uL — ABNORMAL HIGH (ref 0.1–1.0)
Monocytes Relative: 10 %
Neutro Abs: 10.6 10*3/uL — ABNORMAL HIGH (ref 1.7–7.7)
Neutrophils Relative %: 80 %
Platelets: 226 10*3/uL (ref 150–400)
RBC: 5.34 MIL/uL (ref 4.22–5.81)
RDW: 18 % — ABNORMAL HIGH (ref 11.5–15.5)
WBC: 13.2 10*3/uL — ABNORMAL HIGH (ref 4.0–10.5)
nRBC: 0 % (ref 0.0–0.2)

## 2019-09-14 LAB — MAGNESIUM: Magnesium: 2.1 mg/dL (ref 1.7–2.4)

## 2019-09-14 LAB — CULTURE, BLOOD (ROUTINE X 2)
Culture: NO GROWTH
Culture: NO GROWTH
Special Requests: ADEQUATE
Special Requests: ADEQUATE

## 2019-09-14 LAB — GLUCOSE, CAPILLARY
Glucose-Capillary: 215 mg/dL — ABNORMAL HIGH (ref 70–99)
Glucose-Capillary: 146 mg/dL — ABNORMAL HIGH (ref 70–99)
Glucose-Capillary: 188 mg/dL — ABNORMAL HIGH (ref 70–99)

## 2019-09-14 LAB — FERRITIN: Ferritin: 104 ng/mL (ref 24–336)

## 2019-09-14 LAB — C-REACTIVE PROTEIN: CRP: 17.6 mg/dL — ABNORMAL HIGH (ref <1.0)

## 2019-09-14 LAB — D-DIMER, QUANTITATIVE: D-Dimer, Quant: 0.69 ug/mL-FEU — ABNORMAL HIGH (ref 0.00–0.50)

## 2019-09-14 MED ORDER — FUROSEMIDE 10 MG/ML IJ SOLN
40.0000 mg | Freq: Once | INTRAMUSCULAR | Status: AC
  Administered 2019-09-14: 40 mg via INTRAVENOUS
  Filled 2019-09-14: qty 4

## 2019-09-14 NOTE — Evaluation (Signed)
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  Patient had started symptoms on 09/05/2019.  In the emergency room, he was found to be saturating 80% on room air, treated with 6 L of nasal cannula oxygen and admitted to the hospital with pneumonia due to COVID-19 virus with acute hypoxemic respiratory failure.    Clinical Impression   This 63 y/o male presents with the above. PTA pt reports mod independence with ADL and mobility. Pt presenting with weakness, decreased endurance and functional performance. Pt currently requiring overall minA for functional transfers using RW, marching in place (x10 steps, x2 reps) using RW with overall minA. Pt currently requires setup/minguard assist for seated UB ADL, maxA for LB ADL. Pt on 5L O2 during session with SpO2 decreasing to 86% with standing activity, returning to >90% within approx 30 sec and seated rest. Pt reports he typically lives with spouse but reports she is currently in SNF for ST rehab, reports has neighbors/friends/family who can check in regularly. Pt will benefit from continued acute OT services, he reports strong wishes to return home vs SNF at time of discharge however am concerned with pt returning home without additional supervision/assist. If pt to return home recommend he have initial 24hr supervision/assist and [place] services, if 24hr assist unavailable (pending pt progress) pt may require ST SNF. Will follow.    Follow Up Recommendations  SNF;Supervision/Assistance - 24 hour;Home health OT(SNF vs HH, pending progress )    Equipment Recommendations  None  recommended by OT           Precautions / Restrictions Precautions Precautions: Fall;Other (comment) Precaution Comments: monitor O2, bil LE wounds  Restrictions Weight Bearing Restrictions: No      Mobility Bed Mobility               General bed mobility comments: OOB in recliner, reports he sleeps in recliner at home  Transfers Overall transfer level: Needs assistance Equipment used: Rolling walker (2 wheeled) Transfers: Sit to/from Stand Sit to Stand: Min guard         General transfer comment: close minguard assist for safety, pt requires significant time and effort to stand from recliner    Balance Overall balance assessment: Needs assistance Sitting-balance support: Feet supported Sitting balance-Leahy Scale: Fair     Standing balance support: Bilateral upper extremity supported;No upper extremity supported Standing balance-Leahy Scale: Fair Standing balance comment: able to maintain static standing without UE support and minguard assist, improved stability with UE support                            ADL either performed or assessed with clinical judgement   ADL Overall ADL's : Needs assistance/impaired Eating/Feeding: Modified independent;Sitting   Grooming: Set up;Min guard;Sitting   Upper Body Bathing: Min guard;Set up;Sitting   Lower Body Bathing: Sit to/from stand;Maximal assistance   Upper Body Dressing : Min guard;Set up;Sitting   Lower Body Dressing: Maximal assistance;Sit to/from stand Lower Body Dressing Details (indicate cue type and reason): minA standing balance Toilet Transfer: Minimal assistance;Stand-pivot;RW Toilet Transfer Details (indicate cue type and reason): simulated via transfer to/from recliner Toileting- Clothing Manipulation and Hygiene:  Moderate assistance;Maximal assistance;Sit to/from stand       Functional mobility during ADLs: Minimal assistance;Rolling walker General ADL Comments: pt with weakness,  decreased activity tolerance and endurance     Vision         Perception     Praxis      Pertinent Vitals/Pain Pain Assessment: No/denies pain     Hand Dominance Right   Extremity/Trunk Assessment Upper Extremity Assessment Upper Extremity Assessment: Generalized weakness   Lower Extremity Assessment Lower Extremity Assessment: Defer to PT evaluation       Communication Communication Communication: No difficulties   Cognition Arousal/Alertness: Awake/alert Behavior During Therapy: WFL for tasks assessed/performed Overall Cognitive Status: No family/caregiver present to determine baseline cognitive functioning                                 General Comments: overall WLF for basic tasks however pt with decreased insight into current deficits   General Comments       Exercises Exercises: Other exercises Other Exercises Other Exercises: issued level 2 theraband and HEP Other Exercises: use of flutter valve x5 Other Exercises: use of IS though requires mod cues to perform Other Exercises: standing marching in place at RW with minA, x10 marches x2 reps with standing rest in between   Shoulder Instructions      Home Living Family/patient expects to be discharged to:: Private residence Living Arrangements: Spouse/significant other(Spouse is currently in a SNF due to recent LE fracture) Available Help at Discharge: Family;Friend(s);Available PRN/intermittently Type of Home: House Home Access: Ramped entrance     Home Layout: One level     Bathroom Shower/Tub: Walk-in shower         Home Equipment: Wheelchair - power;Walker - 2 wheels;Walker - 4 wheels;Cane - single point;Shower seat          Prior Functioning/Environment Level of Independence: Independent with assistive device(s)        Comments: reports completing mobility tasks using SPC, use of power w/c for community/longer distances, drives        OT Problem List: Decreased  strength;Decreased activity tolerance;Decreased range of motion;Impaired balance (sitting and/or standing);Decreased safety awareness;Decreased knowledge of use of DME or AE;Cardiopulmonary status limiting activity;Obesity      OT Treatment/Interventions: Self-care/ADL training;Therapeutic exercise;Energy conservation;DME and/or AE instruction;Therapeutic activities;Patient/family education;Balance training    OT Goals(Current goals can be found in the care plan section) Acute Rehab OT Goals Patient Stated Goal: "home" OT Goal Formulation: With patient Time For Goal Achievement: 09/28/19 Potential to Achieve Goals: Good  OT Frequency: Min 2X/week   Barriers to D/C:            Co-evaluation              AM-PAC OT "6 Clicks" Daily Activity     Outcome Measure Help from another person eating meals?: None Help from another person taking care of personal grooming?: A Little Help from another person toileting, which includes using toliet, bedpan, or urinal?: A Lot Help from another person bathing (including washing, rinsing, drying)?: A Lot Help from another person to put on and taking off regular upper body clothing?: A Little Help from another person to put on and taking off regular lower body clothing?: A Lot 6 Click Score: 16   End of Session Equipment Utilized During Treatment: Oxygen;Rolling walker Nurse Communication: Mobility status  Activity Tolerance: Patient tolerated treatment well Patient left: in chair;with call  bell/phone within reach  OT Visit Diagnosis: Muscle weakness (generalized) (M62.81);Unsteadiness on feet (R26.81);Other abnormalities of gait and mobility (R26.89)                Time: [id number] OT Time Calculation (min): 31 min Charges:  OT General Charges $OT Visit: 1 Visit OT Evaluation $OT Eval Moderate Complexity: 1 Mod OT Treatments $Therapeutic Activity: 8-22 mins  Lou Cal, OT [facility] Pager  [phone] Office [place] 09/14/2019, 1:42 PM

## 2019-09-14 NOTE — Progress Notes (Signed)
Night nurse attempted to call spouse using both available numbers and was unable to leave message for updates due to voicemail box was full on both lines.

## 2019-09-14 NOTE — Progress Notes (Addendum)
Sitting in the chair this morning this patient has had O2 sats 94-97% on 3L via HFNC.  Patient up to bathroom on 3L N/C. Desat down to the mid 70's. Increased O2 to 6L with slow rebound back to the upper 80's. Patient was SOB during this time.

## 2019-09-14 NOTE — Progress Notes (Signed)
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  Patient had started symptoms on 09/05/2019.  In the emergency room, he was found to be saturating 80% on room air, treated with 6 L of nasal cannula oxygen and admitted to the hospital.   Assessment & Plan:   Principal Problem:   Pneumonia due to COVID-19 virus Active Problems:   Venous stasis ulcers (HCC)   Essential hypertension   Chronic diastolic heart failure (HCC)   Hypokalemia   Acute respiratory failure with hypoxia ([place])  pneumonia due to COVID-19 virus with acute hypoxemic respiratory failure: Continue to monitor due to significant symptoms, still requiring supplemental oxygen. Unable to wean today. chest physiotherapy, incentive spirometry, deep breathing exercises, sputum induction, mucolytic's and bronchodilators. Supplemental oxygen to keep saturations more than 90%. Covid directed therapy with , steroids, dexamethasone 6 mg IV daily day 6. remdesivir, day 5/5, finished therapy.  Antibiotics, not indicated for Covid, indicated for leg cellulitis. Repeat chest x-ray shows more consolidations today.  We will continue to mobilize, continue steroids.  Chronic venous stasis wound with superadded infection: Extensive venous stasis wound.  Wound care consult.  Dressing changes. We will continue Ancef while in the hospital.  Will need aggressive wound care.  Chronic diastolic heart failure: Appears compensated.  Continue Lasix, beta-blockers and ARB. We will try 1 additional dose of IV Lasix today.  Hypertension: Blood pressures are stable.   Hypomagnesemia/hypokalemia: Replaced.    Obstructive sleep apnea: Not using CPAP at home.  Advance activities.  Ambulate.  Continue weaning attempts.  DVT prophylaxis: Lovenox subcu Code Status: Full code Family Communication: None,  Disposition Plan: Remains inpatient, still on oxygen.   Consultants:   None, wound care  Procedures:   Wound care  Antimicrobials:  Anti-infectives (From admission, onward)   Start     Dose/Rate Route Frequency Ordered Stop   09/10/19 1000  remdesivir 100 mg in sodium chloride 0.9 % 100 mL IVPB     100 mg 200 mL/hr over 30 Minutes Intravenous Daily 09/09/19 0248 09/13/19 1114   09/09/19 1700  ceFAZolin (ANCEF) IVPB 2g/100 mL premix     2 g 200 mL/hr over 30 Minutes Intravenous Every 8 hours 09/09/19 1611     09/09/19 0600  remdesivir 200 mg in sodium chloride 0.9% 250 mL IVPB     200 mg 580 mL/hr over 30 Minutes Intravenous Once 09/09/19 0248 09/09/19 0441         Subjective: Seen and examined.  "I want to go home".  Ambulated to go to bathroom, became acutely short of breath needed 6 L oxygen to recover.  Remains afebrile.  He himself thinks that his breathing is back to himself. Objective: Vitals:   09/14/19 0600 09/14/19 0656 09/14/19 0808 09/14/19 1000  BP:   109/67   Pulse:   68   Resp:   (!) 22   Temp:      TempSrc:      SpO2: 94% 92% (!) 89% 94%  Weight:      Height:        Intake/Output Summary (Last 24 hours) at 09/14/2019 1140 Last data filed at 09/14/2019 1127 Gross per 24  hour  Intake 100 ml  Output 2575 ml  Net -2475 ml   Filed Weights   09/09/19 0153  Weight: (!) 163.3 kg    Examination:  General exam: Appears calm and comfortable while sitting in chair.  Short of breath and abdominal breathing on ambulation needing up to 6 L of oxygen. Respiratory system: Clear to auscultation. Respiratory effort mildly abnormal.  No added sound. Cardiovascular system: S1 & S2 heard, RRR. No JVD, murmurs, rubs, gallops  or clicks.  Extensive venous stasis wound. Gastrointestinal system: Abdomen is nondistended, soft and nontender. No organomegaly or masses felt. Normal bowel sounds heard.  Obese and pendulous. Central nervous system: Alert and oriented. No focal neurological deficits. Extremities: Symmetric 5 x 5 power. Skin: No rashes, lesions or ulcers Psychiatry: Judgement and insight appear normal. Mood & affect anxious. Extensive venous stasis wound bilateral leg with some areas of redness and drainage, no fluctuation or induration. Dressing done.    Data Reviewed: I have personally reviewed following labs and imaging studies  CBC: Recent Labs  Lab 09/10/19 0055 09/11/19 0235 09/12/19 1213 09/13/19 0447 09/14/19 0311  WBC 14.7* 10.3 8.6 11.3* 13.2*  NEUTROABS 13.1* 8.6* 7.8* 8.7* 10.6*  HGB 13.0 13.0 12.6* 13.4 12.9*  HCT 43.5 42.5 41.9 44.5 42.5  MCV 81.2 80.0 79.8* 80.0 79.6*  PLT 190 191 230 222 [id number]   Basic Metabolic Panel: Recent Labs  Lab 09/10/19 0055 09/11/19 0235 09/12/19 1213 09/13/19 0447 09/14/19 0311  NA 138 135 134* 138 133*  K 4.2 3.6 3.5 3.7 3.7  CL 97* 91* 91* 92* 88*  CO2 29 30 28  33* 32  GLUCOSE 147* 183* 299* 150* 177*  BUN 26* 32* 38* 45* 43*  CREATININE 1.02 0.97 1.16 1.12 1.13  CALCIUM 8.8* 8.7* 8.2* 8.9 8.5*  MG 1.8 1.8 1.9 2.3 2.1   GFR: Estimated Creatinine Clearance: 101.9 mL/min (by C-G formula based on SCr of 1.13 mg/dL). Liver Function Tests: Recent Labs  Lab 09/10/19 0055 09/11/19 0235 09/12/19 1213 09/13/19 0447 09/14/19 0311  AST 17 18 26 29 27   ALT 16 15 23 27 25   ALKPHOS 55 57 54 59 66  BILITOT 1.0 0.7 0.6 0.4 0.6  PROT 7.0 6.9 6.5 6.9 6.8  ALBUMIN 3.3* 3.2* 2.9* 3.0* 2.9*   No results for input(s): LIPASE, AMYLASE in the last 168 hours. No results for input(s): AMMONIA in the last 168 hours. Coagulation Profile: No results for input(s): INR, PROTIME in the last 168 hours. Cardiac Enzymes: No results for input(s): CKTOTAL, CKMB,  CKMBINDEX, TROPONINI in the last 168 hours. BNP (last 3 results) No results for input(s): PROBNP in the last 8760 hours. HbA1C: Recent Labs    09/12/19 1213  HGBA1C 6.5*   CBG: Recent Labs  Lab 09/12/19 2003 09/13/19 1156 09/13/19 1731 09/13/19 2215 09/14/19 0748  GLUCAP 184* 188* 151* 166* 215*   Lipid Profile: No results for input(s): CHOL, HDL, LDLCALC, TRIG, CHOLHDL, LDLDIRECT in the last 72 hours. Thyroid Function Tests: No results for input(s): TSH, T4TOTAL, FREET4, T3FREE, THYROIDAB in the last 72 hours. Anemia Panel: Recent Labs    09/13/19 0447 09/14/19 0311  FERRITIN 135 104   Sepsis Labs: Recent Labs  Lab 09/09/19 0249 09/09/19 0250  PROCALCITON  --  0.25  LATICACIDVEN 1.2  --     Recent Results (from the past 240 hour(s))  Blood Culture (routine x 2)     Status: None (Preliminary result)   Collection Time: 09/09/19  2:49 AM  Specimen: BLOOD  Result Value Ref Range Status   Specimen Description BLOOD LEFT ANTECUBITAL  Final   Special Requests   Final    BOTTLES DRAWN AEROBIC AND ANAEROBIC Blood Culture adequate volume   Culture   Final    NO GROWTH 4 DAYS Performed at [facility], [address], [place], [place] [address]    Report Status PENDING  Incomplete  Blood Culture (routine x 2)     Status: None (Preliminary result)   Collection Time: 09/09/19  3:00 AM   Specimen: BLOOD RIGHT HAND  Result Value Ref Range Status   Specimen Description BLOOD RIGHT HAND  Final   Special Requests   Final    BOTTLES DRAWN AEROBIC AND ANAEROBIC Blood Culture adequate volume   Culture   Final    NO GROWTH 4 DAYS Performed at [facility], [address], [place],  [address]    Report Status PENDING  Incomplete         Radiology Studies: DG CHEST PORT 1 VIEW  Result Date: 09/14/2019 CLINICAL DATA:  Pneumonia. EXAM: PORTABLE CHEST 1 VIEW COMPARISON:  09/09/2019 FINDINGS: Lungs are hypoinflated demonstrate moderate patchy bilateral airspace  consolidation with overall interval worsening. No evidence of effusion. Mild stable cardiomegaly. Remainder of the exam is unchanged. IMPRESSION: Overall interval worsening of moderate bilateral airspace process likely multifocal pneumonia. Electronically Signed   By: Marin Olp M.D.   On: 09/14/2019 09:35        Scheduled Meds: . allopurinol  300 mg Oral Daily  . aspirin EC  81 mg Oral Daily  . atorvastatin  10 mg Oral Daily  . clopidogrel  75 mg Oral Daily  . dexamethasone (DECADRON) injection  6 mg Intravenous Q24H  . enoxaparin (LOVENOX) injection  80 mg Subcutaneous Q24H  . furosemide  40 mg Intravenous Once  . furosemide  40 mg Oral BID  . hydrALAZINE  50 mg Oral TID  . hydrochlorothiazide  25 mg Oral Daily  . insulin aspart  0-15 Units Subcutaneous TID WC  . insulin aspart  0-5 Units Subcutaneous QHS  . irbesartan  300 mg Oral Daily  . magnesium oxide  400 mg Oral BID  . mouth rinse  15 mL Mouth Rinse BID  . metoprolol succinate  100 mg Oral Daily  . sodium chloride flush  3 mL Intravenous Q12H  . sodium chloride flush  3 mL Intravenous Q12H   Continuous Infusions: . sodium chloride    .  ceFAZolin (ANCEF) IV 2 g (09/14/19 0237)     LOS: 5 days    Time spent: 30 minutes    Barb Merino, MD [facility] Pager [phone]

## 2019-09-15 LAB — GLUCOSE, CAPILLARY
Glucose-Capillary: 158 mg/dL — ABNORMAL HIGH (ref 70–99)
Glucose-Capillary: 231 mg/dL — ABNORMAL HIGH (ref 70–99)
Glucose-Capillary: 224 mg/dL — ABNORMAL HIGH (ref 70–99)
Glucose-Capillary: 288 mg/dL — ABNORMAL HIGH (ref 70–99)
Glucose-Capillary: 265 mg/dL — ABNORMAL HIGH (ref 70–99)
Glucose-Capillary: 190 mg/dL — ABNORMAL HIGH (ref 70–99)
Glucose-Capillary: 204 mg/dL — ABNORMAL HIGH (ref 70–99)

## 2019-09-15 NOTE — Progress Notes (Signed)
Physical Therapy Treatment Patient Details Name: [name] [name] MRN: [id number] DOB: 04-20-1956 Today's Date: 09/15/2019    History of Present Illness 63 year old male with history of COVID +, bilateral wounds LEs and long term asbestos exposure. Currently on 3 LPM, [place] - does have rebreather but was not using during session.    PT Comments    He has little insight into his deficits. He is cooperative with PT, but has to be closely monitored reguarding his O2 sats as he desats with min exertion. He does use oxygen at home at night, and relates that he has sleep apnea, being told he needs CPAP- but does not have one. O2 was increased from 3 to 4 during session due to desatting into the 70's during ambulation. Assistance of 2 for safety.   Follow Up Recommendations  Home health PT     Equipment Recommendations    has all necessary equipment   Recommendations for Other Services       Precautions / Restrictions Precautions Precautions: Fall Restrictions Weight Bearing Restrictions: No    Mobility  Bed Mobility Overal bed mobility: (Did not perform during PT session.)                Transfers Overall transfer level: Needs assistance Equipment used: Rolling walker (2 wheeled)   Sit to Stand: Min assist;Mod assist         General transfer comment: He presents with generalized weakness including trunk and LEs. Difficulty arising from low surface  Ambulation/Gait Ambulation/Gait assistance: Min assist   Assistive device: Rolling walker (2 wheeled) Gait Pattern/deviations: Shuffle;Wide base of support;Antalgic     General Gait Details: He is morbidly obese- BIL LE wounds have bulky dressings with outer ace wraps   Stairs             Wheelchair Mobility    Modified Rankin (Stroke Patients Only)       Balance Overall balance assessment: Mild deficits observed, not formally tested Sitting-balance support: Bilateral upper extremity  supported Sitting balance-Leahy Scale: Good     Standing balance support: Bilateral upper extremity supported Standing balance-Leahy Scale: Fair                              Cognition Arousal/Alertness: Awake/alert Behavior During Therapy: WFL for tasks assessed/performed(He does have reduced safety awareness as well as insight into deficits) Overall Cognitive Status: Within Functional Limits for tasks assessed                                        Exercises General Exercises - Lower Extremity Ankle Circles/Pumps: AROM;Seated Quad Sets: AROM;Seated Gluteal Sets: Seated;AROM Short Arc Quad: Seated;AROM Other Exercises Other Exercises: Performed proper breathing ex with flutter and inspirometer (up to 500 today 6-10 times)    General Comments        Pertinent Vitals/Pain Pain Assessment: No/denies pain    Home Living                      Prior Function            PT Goals (current goals can now be found in the care plan section) Acute Rehab PT Goals Patient Stated Goal: He states "he just wants to go home and does not understand why he is still here" Progress towards  PT goals: Progressing toward goals    Frequency    Min 3X/week      PT Plan Current plan remains appropriate    Co-evaluation              AM-PAC PT "6 Clicks" Mobility   Outcome Measure  Help needed turning from your back to your side while in a flat bed without using bedrails?: A Little Help needed moving from lying on your back to sitting on the side of a flat bed without using bedrails?: A Little Help needed moving to and from a bed to a chair (including a wheelchair)?: A Little Help needed standing up from a chair using your arms (e.g., wheelchair or bedside chair)?: A Little Help needed to walk in hospital room?: A Little Help needed climbing 3-5 steps with a railing? : A Lot 6 Click Score: 17    End of Session Equipment Utilized During  Treatment: Gait belt Activity Tolerance: Patient tolerated treatment well(While he did not complain of fatigue- he desats with min exertion and does not have good insight .Increased O2 to 4 LOM, [place] during session.) Patient left: in chair;with call bell/phone within reach   PT Visit Diagnosis: Unsteadiness on feet (R26.81);Repeated falls (R29.6);Muscle weakness (generalized) (M62.81);History of falling (Z91.81)     Time: 1420-1510 PT Time Calculation (min) (ACUTE ONLY): 50 min  Charges:  $Gait Training: 8-22 mins $Therapeutic Exercise: 8-22 mins $Therapeutic Activity: 8-22 mins                     Rollen Sox, PT # [phone] CGV cell    Casandra Doffing 09/15/2019, 3:54 PM

## 2019-09-15 NOTE — Progress Notes (Signed)
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  Patient had started symptoms on 09/05/2019.  In the emergency room, he was found to be saturating 80% on room air, treated with 6 L of nasal cannula oxygen and admitted to the hospital.   Assessment & Plan:   Principal Problem:   Pneumonia due to COVID-19 virus Active Problems:   Venous stasis ulcers (HCC)   Essential hypertension   Chronic diastolic heart failure (HCC)   Hypokalemia   Acute respiratory failure with hypoxia ([place])  pneumonia due to COVID-19 virus with acute hypoxemic respiratory failure: Continue to monitor due to significant symptoms, still requiring supplemental oxygen. Unable to wean off the oxygen.  Continue with chest physiotherapy, incentive spirometry, deep breathing exercises, sputum induction, mucolytic's and bronchodilators. Supplemental oxygen to keep saturations more than 90%. Covid directed therapy with , steroids, dexamethasone 6 mg IV daily day 7. remdesivir, day 5/5, finished therapy.  Antibiotics, not indicated for Covid, indicated for leg cellulitis. We will continue to mobilize, continue steroids. Ambulate and assess for home oxygen need, patient probably has chronic hypoxia with underlying sleep apnea and obesity hypoventilation that is not treated.  If after adequate clinical improvement he still needs oxygen less than 3 L, will discharge home with supplemental oxygen.  Chronic venous stasis wound with superadded infection: Extensive venous stasis wound.  Wound care consult.  Dressing changes. Received Ancef for 6 days, discontinued.     Chronic diastolic heart failure: Appears compensated.  Continue Lasix, beta-blockers and ARB. Trying intermittent additional doses of Lasix.  Hypertension: Blood pressures are stable.  Hypomagnesemia/hypokalemia: Replaced.  Improved.  Obstructive sleep apnea: Not using CPAP at home.  Advance activities.  Ambulate.  Continue weaning attempts. PT recommended home health versus SNF, he is not going to go to SNF.  He wants to go home when possible.  DVT prophylaxis: Lovenox subcu Code Status: Full code Family Communication: None, patient talking to family. Disposition Plan: Remains inpatient, still on significant oxygen.   Consultants:   None, wound care  Procedures:   Wound care  Antimicrobials:  Anti-infectives (From admission, onward)   Start     Dose/Rate Route Frequency Ordered Stop   09/10/19 1000  remdesivir 100 mg in sodium chloride 0.9 % 100 mL IVPB     100 mg 200 mL/hr over 30 Minutes Intravenous Daily 09/09/19 0248 09/13/19 1114   09/09/19 1700  ceFAZolin (ANCEF) IVPB 2g/100 mL premix  Status:  Discontinued     2 g 200 mL/hr over 30 Minutes Intravenous Every 8 hours 09/09/19 1611 09/15/19 1101   09/09/19 0600  remdesivir 200 mg in sodium chloride 0.9% 250 mL IVPB     200 mg 580 mL/hr over 30 Minutes Intravenous Once 09/09/19 0248 09/09/19 0441         Subjective: Seen and examined.  No overnight events.  Patient himself thinks this is his baseline.  On 4 to 5 L of oxygen. Objective: Vitals:   09/15/19 0044 09/15/19 0400 09/15/19 0520 09/15/19 0922  BP: 131/74  128/64 121/74  Pulse: 69  72 72  Resp: (!) 31  (!) 23 (!) 27  Temp: 98 F (36.7 C)  98.3 F (36.8 C) 98.1 F (36.7 C)  TempSrc: Oral  Oral Oral  SpO2: (!) 89% (!) 84% 91% 93%  Weight:      Height:        Intake/Output Summary (Last 24 hours) at 09/15/2019 1104 Last data filed at 09/15/2019 [id number] Gross per 24 hour  Intake 580 ml  Output 2540 ml  Net -1960 ml   Filed Weights    09/09/19 0153  Weight: (!) 163.3 kg    Examination:  General exam: Appears calm and comfortable, sitting in couch and eating breakfast.   On 5 L oxygen.   Respiratory system: Clear to auscultation. Respiratory effort mildly abnormal.  No added sound. Cardiovascular system: S1 & S2 heard, RRR. No JVD, murmurs, rubs, gallops or clicks.  Extensive venous stasis wound. Gastrointestinal system: Abdomen is nondistended, soft and nontender. No organomegaly or masses felt. Normal bowel sounds heard.  Obese and pendulous. Central nervous system: Alert and oriented. No focal neurological deficits. Extremities: Symmetric 5 x 5 power. Skin: No rashes, lesions or ulcers Psychiatry: Judgement and insight appear normal. Mood & affect normal. Extensive venous stasis wound bilateral leg with some areas of redness and drainage, no fluctuation or induration. Dressing done every day by nursing staff.  Data Reviewed: I have personally reviewed following labs and imaging studies  CBC: Recent Labs  Lab 09/10/19 0055 09/11/19 0235 09/12/19 1213 09/13/19 0447 09/14/19 0311  WBC 14.7* 10.3 8.6 11.3* 13.2*  NEUTROABS 13.1* 8.6* 7.8* 8.7* 10.6*  HGB 13.0 13.0 12.6* 13.4 12.9*  HCT 43.5 42.5 41.9 44.5 42.5  MCV 81.2 80.0 79.8* 80.0 79.6*  PLT 190 191 230 222 [id number]   Basic Metabolic Panel: Recent Labs  Lab 09/10/19 0055 09/11/19 0235 09/12/19 1213 09/13/19 0447 09/14/19 0311  NA 138 135 134* 138 133*  K 4.2 3.6 3.5 3.7 3.7  CL 97* 91* 91* 92* 88*  CO2 29 30 28  33* 32  GLUCOSE 147* 183* 299* 150* 177*  BUN 26* 32* 38* 45* 43*  CREATININE 1.02 0.97 1.16 1.12 1.13  CALCIUM 8.8* 8.7* 8.2* 8.9 8.5*  MG 1.8 1.8 1.9 2.3 2.1   GFR: Estimated Creatinine Clearance: 101.9 mL/min (by C-G formula based on SCr of 1.13 mg/dL). Liver Function Tests: Recent Labs  Lab 09/10/19 0055 09/11/19 0235 09/12/19 1213 09/13/19 0447 09/14/19 0311  AST 17 18 26 29 27   ALT 16 15 23 27 25   ALKPHOS 55 57 54 59 66   BILITOT 1.0 0.7 0.6 0.4 0.6  PROT 7.0 6.9 6.5 6.9 6.8  ALBUMIN 3.3* 3.2* 2.9* 3.0* 2.9*   No results for input(s): LIPASE, AMYLASE in the last 168 hours. No results for input(s): AMMONIA in the last 168 hours. Coagulation Profile: No results for input(s): INR, PROTIME in the last 168 hours. Cardiac Enzymes: No results for input(s): CKTOTAL, CKMB, CKMBINDEX, TROPONINI in the last 168 hours. BNP (last 3 results) No results for input(s): PROBNP in the last 8760 hours. HbA1C: Recent Labs    09/12/19 1213  HGBA1C 6.5*   CBG: Recent Labs  Lab 09/13/19 2215 09/14/19 0748 09/14/19 1724 09/14/19 2138 09/15/19 0810  GLUCAP 166* 215* 146* 188* 224*   Lipid Profile: No results for input(s): CHOL, HDL, LDLCALC, TRIG, CHOLHDL, LDLDIRECT in the last 72 hours. Thyroid Function Tests: No results for input(s): TSH, T4TOTAL, FREET4, T3FREE, THYROIDAB in the last 72 hours. Anemia Panel: Recent Labs    09/13/19 0447 09/14/19 0311  FERRITIN 135 104   Sepsis Labs: Recent  Labs  Lab 09/09/19 0249 09/09/19 0250  PROCALCITON  --  0.25  LATICACIDVEN 1.2  --     Recent Results (from the past 240 hour(s))  Blood Culture (routine x 2)     Status: None   Collection Time: 09/09/19  2:49 AM   Specimen: BLOOD  Result Value Ref Range Status   Specimen Description BLOOD LEFT ANTECUBITAL  Final   Special Requests   Final    BOTTLES DRAWN AEROBIC AND ANAEROBIC Blood Culture adequate volume   Culture   Final    NO GROWTH 5 DAYS Performed at [facility], [address] [place], [place] [address]    Report Status 09/14/2019 FINAL  Final  Blood Culture (routine x 2)     Status: None   Collection Time: 09/09/19  3:00 AM   Specimen: BLOOD RIGHT HAND  Result Value Ref Range Status   Specimen Description BLOOD RIGHT HAND  Final   Special Requests   Final    BOTTLES DRAWN AEROBIC AND ANAEROBIC Blood Culture adequate volume   Culture   Final    NO GROWTH 5 DAYS Performed at [facility], [address], [place], [place] [address]    Report Status 09/14/2019 FINAL  Final         Radiology Studies: DG CHEST PORT 1 VIEW  Result Date: 09/14/2019 CLINICAL DATA:  Pneumonia. EXAM: PORTABLE CHEST 1 VIEW COMPARISON:  09/09/2019 FINDINGS: Lungs are hypoinflated demonstrate moderate patchy bilateral airspace consolidation with overall interval worsening. No evidence of effusion. Mild stable cardiomegaly. Remainder of the exam is unchanged. IMPRESSION: Overall interval worsening of moderate bilateral airspace process likely multifocal pneumonia. Electronically Signed   By: Marin Olp M.D.   On: 09/14/2019 09:35        Scheduled Meds: . allopurinol  300 mg Oral Daily  . aspirin EC  81 mg Oral Daily  . atorvastatin  10 mg Oral Daily  . clopidogrel  75 mg Oral Daily  . dexamethasone (DECADRON) injection  6 mg Intravenous Q24H  . enoxaparin (LOVENOX) injection  80 mg Subcutaneous Q24H  . furosemide  40 mg Oral BID  . hydrALAZINE  50 mg Oral TID  . hydrochlorothiazide  25 mg Oral Daily  . insulin aspart  0-15 Units Subcutaneous TID WC  . insulin aspart  0-5 Units Subcutaneous QHS  . irbesartan  300 mg Oral Daily  . magnesium oxide  400 mg Oral BID  . mouth rinse  15 mL Mouth Rinse BID  . metoprolol succinate  100 mg Oral Daily  . sodium chloride flush  3 mL Intravenous Q12H  . sodium chloride flush  3 mL Intravenous Q12H   Continuous Infusions: . sodium chloride       LOS: 6 days    Time spent: 30 minutes    Barb Merino, MD [facility] Pager [phone]

## 2019-09-16 LAB — GLUCOSE, CAPILLARY
Glucose-Capillary: 136 mg/dL — ABNORMAL HIGH (ref 70–99)
Glucose-Capillary: 131 mg/dL — ABNORMAL HIGH (ref 70–99)
Glucose-Capillary: 174 mg/dL — ABNORMAL HIGH (ref 70–99)
Glucose-Capillary: 157 mg/dL — ABNORMAL HIGH (ref 70–99)

## 2019-09-16 LAB — CREATININE, SERUM
Creatinine, Ser: 1.28 mg/dL — ABNORMAL HIGH (ref 0.61–1.24)
GFR calc Af Amer: >60 mL/min (ref >60)
GFR calc non Af Amer: 59 mL/min — ABNORMAL LOW (ref >60)

## 2019-09-16 MED ORDER — DEXAMETHASONE 6 MG PO TABS: 6.0000 mg | ORAL_TABLET | Freq: Every day | ORAL | 0 refills | Status: AC

## 2019-09-16 NOTE — Care Management Important Message (Signed)
Important Message  Patient Details  Name: [name] [name] MRN: [id number] Date of Birth: Jan 11, 1956   Medicare Important Message Given:  Yes - Important Message mailed due to current National Emergency  Verbal consent obtained due to current National Emergency  Relationship to patient: Self Contact Name: Elvira Brundrett Call Date: 09/16/19  Time: 1513 Phone: [phone] Outcome: Spoke with contact Important Message mailed to: Patient address on file    Delorse Lek 09/16/2019, 3:14 PM

## 2019-09-16 NOTE — Plan of Care (Signed)

## 2019-09-16 NOTE — Discharge Summary (Signed)
Physician Discharge Summary  ELDO WOHLER [name] DOB: [birth year] DOA: 09/09/2019  PCP: Manon Hilding, MD  Admit date: 09/09/2019 Discharge date: 09/16/2019  Admitted From: Home Disposition: Home  Recommendations for Outpatient Follow-up:  1. Follow up with PCP in 1-2 weeks 2. Please obtain BMP/CBC in one week Isolation for 2 more weeks, total 3 weeks.  Home Health: Already have home health for his wound care.  Regimen. 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 Patient had started symptoms on 09/05/2019.  In the emergency room, he was found to be saturating 80% on room air, treated with 6 L of nasal cannula oxygen and admitted to the hospital.   pneumonia due to COVID-19 virus with acute hypoxemic respiratory failure: Stable but patient is still requiring supplemental oxygen. Patient was treated with 5 days of remdesivir therapy.   He received dexamethasone, will continue 2 more days to finish 10 days of therapy.   Stabilizing, however he still needs about 3 L of oxygen on ambulation.  Patient does have obesity hypoventilation and untreated sleep apnea, probably has some component of chronic hypoxia.  Patient is adamant to go home today.  His oxygen requirement has been stable for last 3 to 4 days, going home with supplemental oxygen.   Chronic venous stasis wound with superadded infection: Extensive venous stasis wound.    Seen by wound care.  Received daily dressing in the hospital. Treated with 6 days therapy with Ancef.   Patient has wound care at home, they come home and do wound care twice a  week.  He will resume this.  Chronic diastolic heart failure: Appears compensated.  Continue Lasix, beta-blockers and ARB.  Hypertension: Blood pressures are stable.  Hypomagnesemia/hypokalemia: Replaced.  Improved.  Obstructive sleep apnea: Not using CPAP at home.  Encourage patient to talk to his provider, he will benefit with going on CPAP machine.  Patient was seen by therapist in the hospital.  He does have some impaired mobility.  Suggested him to go to a skilled nursing rehab as recommended by therapist, however patient is declining.  He requested to be discharged today.  Patient stated that he has adequate support with his stepchildren and with his neighbors.    Discharge Diagnoses:  Principal Problem:   Pneumonia due to COVID-19 virus Active Problems:   Venous stasis ulcers (HCC)   Essential hypertension   Chronic diastolic heart failure (HCC)   Hypokalemia   Acute respiratory failure with hypoxia [facility])    Discharge Instructions  Discharge Instructions    Diet - low sodium heart healthy   Complete by: As directed    Increase activity slowly   Complete by: As directed      Allergies as of 09/16/2019   No Known Allergies     Medication List    TAKE these medications   acetaminophen 500 MG tablet Commonly known as: TYLENOL Take 1,000 mg by mouth daily as needed (pain).   allopurinol 300 MG tablet Commonly known as: ZYLOPRIM Take 300 mg by mouth daily.   aspirin EC 81 MG tablet Take 1 tablet (81 mg total) by mouth daily.   atorvastatin 10 MG tablet Commonly known as: LIPITOR Take 10 mg by  mouth daily.   clopidogrel 75 MG tablet Commonly known as: PLAVIX Take 75 mg by mouth daily.   dexamethasone 6 MG tablet Commonly known as: DECADRON Take 1 tablet (6 mg total) by mouth daily for 2 days.   furosemide 40 MG tablet Commonly known as: LASIX Take 40 mg by mouth 2 (two) times daily.   hydrALAZINE 50 MG tablet Commonly known as: APRESOLINE TAKE  1 TABLET BY MOUTH TWICE A DAY. NEED OFFICE VISIT FOR FURTHER REFILLS What changed: See the new instructions.   ibuprofen 200 MG tablet Commonly known as: ADVIL Take 400-600 mg by mouth daily as needed (pain).   metoprolol succinate 100 MG 24 hr tablet Commonly known as: TOPROL-XL Take 100 mg by mouth daily. Take with or immediately following a meal.   olmesartan-hydrochlorothiazide 40-25 MG tablet Commonly known as: BENICAR HCT Take 1 tablet by mouth daily.   vitamin C 1000 MG tablet Take 6,000 mg by mouth daily as needed (when a cold is coming on.).            Durable Medical Equipment  (From admission, onward)         Start     Ordered   09/16/19 1258  DME Oxygen  Once    Comments: Patient Saturations on Room Air at Rest = 87%  Patient Saturations on [employer] while Ambulating = 83%  Patient Saturations on 3 Liters of oxygen while Ambulating = 90%  Please briefly explain why patient needs home oxygen:  Desats when at rest and ambulating on room air.  Question Answer Comment  Length of Need Lifetime   Mode or (Route) Nasal cannula   Liters per Minute 3   Frequency Continuous (stationary and portable oxygen unit needed)   Oxygen conserving device Yes   Oxygen delivery system Gas      09/16/19 1258          No Known Allergies  Consultations:  Wound care   Procedures/Studies: DG CHEST PORT 1 VIEW  Result Date: 09/14/2019 CLINICAL DATA:  Pneumonia. EXAM: PORTABLE CHEST 1 VIEW COMPARISON:  09/09/2019 FINDINGS: Lungs are hypoinflated demonstrate moderate patchy bilateral airspace consolidation with overall interval worsening. No evidence of effusion. Mild stable cardiomegaly. Remainder of the exam is unchanged. IMPRESSION: Overall interval worsening of moderate bilateral airspace process likely multifocal pneumonia. Electronically Signed   By: Marin Olp M.D.   On: 09/14/2019 09:35   DG Chest Port 1 View  Result Date: 09/09/2019 CLINICAL DATA:   Shortness of breath, chills, nausea, vomiting and cough, fever EXAM: PORTABLE CHEST 1 VIEW COMPARISON:  Radiograph 07/28/2015 FINDINGS: Multifocal patchy interstitial and airspace opacity throughout the lungs with some peripheral predominance. No pneumothorax. No visible effusion the portions of the left costophrenic sulcus are collimated. There is cardiomegaly which may be accentuated by portable technique. No acute osseous or soft tissue abnormality. IMPRESSION: 1. Multifocal patchy interstitial and airspace opacities throughout the lungs with some peripheral predominance. Findings are suspicious for multifocal pneumonia. Electronically Signed   By: Lovena Le M.D.   On: 09/09/2019 02:20     Subjective: Patient seen and examined.  No overnight events.  Patient thinks he is at his baseline.  He always has some difficulty breathing because of his belly.  He wants to go home.  Agreeable to use oxygen.   Discharge Exam: Vitals:   09/16/19 1108 09/16/19 1227  BP:  135/64  Pulse:    Resp:    Temp:  98.3 F (36.8 C)  SpO2: 90%    Vitals:   09/16/19 1104 09/16/19 1106 09/16/19 1108 09/16/19 1227  BP:    135/64  Pulse:      Resp:      Temp:    98.3 F (36.8 C)  TempSrc:    Oral  SpO2: (!) 87% (!) 83% 90%   Weight:      Height:        General: Pt is alert, awake, not in acute distress, sitting in chair.  Currently on 3 L of oxygen. Cardiovascular: RRR, S1/S2 +, no rubs, no gallops Respiratory: CTA bilaterally, no wheezing, no rhonchi, no added sound. Abdominal: Soft, NT, ND, bowel sounds +, obese and pendulous. Extremities: Extensive bilateral venous stasis wounds.  Pictures in the chart.    The results of significant diagnostics from this hospitalization (including imaging, microbiology, ancillary and laboratory) are listed below for reference.     Microbiology: Recent Results (from the past 240 hour(s))  Blood Culture (routine x 2)     Status: None   Collection Time: 09/09/19   2:49 AM   Specimen: BLOOD  Result Value Ref Range Status   Specimen Description BLOOD LEFT ANTECUBITAL  Final   Special Requests   Final    BOTTLES DRAWN AEROBIC AND ANAEROBIC Blood Culture adequate volume   Culture   Final    NO GROWTH 5 DAYS Performed at [facility], [address] [place], [place] [address]    Report Status 09/14/2019 FINAL  Final  Blood Culture (routine x 2)     Status: None   Collection Time: 09/09/19  3:00 AM   Specimen: BLOOD RIGHT HAND  Result Value Ref Range Status   Specimen Description BLOOD RIGHT HAND  Final   Special Requests   Final    BOTTLES DRAWN AEROBIC AND ANAEROBIC Blood Culture adequate volume   Culture   Final    NO GROWTH 5 DAYS Performed at [facility], [address] [place], [place] [address]    Report Status 09/14/2019 FINAL  Final     Labs: BNP (last 3 results) Recent Labs    09/09/19 0250  BNP [id number]*   Basic Metabolic Panel: Recent Labs  Lab 09/10/19 0055 09/11/19 0235 09/12/19 1213 09/13/19 0447 09/14/19 0311 09/16/19 0420  NA 138 135 134* 138 133*  --   K 4.2 3.6 3.5 3.7 3.7  --   CL 97* 91* 91* 92* 88*  --   CO2 29 30 28  33* 32  --   GLUCOSE 147* 183* 299* 150* 177*  --   BUN 26* 32* 38* 45* 43*  --   CREATININE 1.02 0.97 1.16 1.12 1.13 1.28*  CALCIUM 8.8* 8.7* 8.2* 8.9 8.5*  --   MG 1.8 1.8 1.9 2.3 2.1  --    Liver Function Tests: Recent Labs  Lab 09/10/19 0055 09/11/19 0235 09/12/19 1213 09/13/19 0447 09/14/19 0311  AST 17 18 26 29 27   ALT 16 15 23 27 25   ALKPHOS 55 57 54 59 66  BILITOT 1.0 0.7 0.6 0.4 0.6  PROT 7.0 6.9 6.5 6.9 6.8  ALBUMIN 3.3* 3.2* 2.9* 3.0* 2.9*   No results for input(s): LIPASE, AMYLASE in the last 168 hours. No results for input(s): AMMONIA in the last 168 hours. CBC: Recent Labs  Lab 09/10/19 0055 09/11/19 0235 09/12/19 1213 09/13/19 0447 09/14/19 0311  WBC 14.7* 10.3 8.6 11.3* 13.2*  NEUTROABS 13.1* 8.6* 7.8* 8.7* 10.6*  HGB 13.0 13.0 12.6* 13.4 12.9*  HCT 43.5  42.5 41.9 44.5 42.5  MCV 81.2 80.0 79.8* 80.0 79.6*  PLT 190 191 230 222 226   Cardiac Enzymes: No results for input(s): CKTOTAL, CKMB, CKMBINDEX, TROPONINI in the last 168 hours. BNP: Invalid input(s): POCBNP CBG: Recent Labs  Lab 09/15/19 1218 09/15/19 1634 09/15/19 2044 09/16/19 0826 09/16/19 1215  GLUCAP 265* 190* 204* 136* 131*   D-Dimer Recent Labs    09/14/19 0311  DDIMER 0.69*   Hgb A1c No results for input(s): HGBA1C in the last 72 hours. Lipid Profile No results for input(s): CHOL, HDL, LDLCALC, TRIG, CHOLHDL, LDLDIRECT in the last 72 hours. Thyroid function studies No results for input(s): TSH, T4TOTAL, T3FREE, THYROIDAB in the last 72 hours.  Invalid input(s): FREET3 Anemia work up [employer]    09/14/19 0311  FERRITIN 104   Urinalysis No results found for: COLORURINE, APPEARANCEUR, [place], [place], [place], [place], [place], [place], [place], UROBILINOGEN, NITRITE, LEUKOCYTESUR Sepsis Labs Invalid input(s): PROCALCITONIN,  WBC,  LACTICIDVEN Microbiology Recent Results (from the past 240 hour(s))  Blood Culture (routine x 2)     Status: None   Collection Time: 09/09/19  2:49 AM   Specimen: BLOOD  Result Value Ref Range Status   Specimen Description BLOOD LEFT ANTECUBITAL  Final   Special Requests   Final    BOTTLES DRAWN AEROBIC AND ANAEROBIC Blood Culture adequate volume   Culture   Final    NO GROWTH 5 DAYS Performed at [facility], [address] [place], [place] [address]    Report Status 09/14/2019 FINAL  Final  Blood Culture (routine x 2)     Status: None   Collection Time: 09/09/19  3:00 AM   Specimen: BLOOD RIGHT HAND  Result Value Ref Range Status   Specimen Description BLOOD RIGHT HAND  Final   Special Requests   Final    BOTTLES DRAWN AEROBIC AND ANAEROBIC Blood Culture adequate volume   Culture   Final    NO GROWTH 5 DAYS Performed at [facility], [address], [place],  [address]    Report Status 09/14/2019  FINAL  Final     Time coordinating discharge:  40 minutes  SIGNED:   Barb Merino, MD  [facility] 09/16/2019, 12:59 PM

## 2019-09-16 NOTE — TOC Progression Note (Signed)
Transition of Care [facility]) - Progression Note    Patient Details  Name: SPIRIDON GAUDREAULT MRN: [id number] Date of Birth: 06/05/1956  Transition of Care [facility]) CM/SW Contact  Loletha Grayer Beverely Pace, RN Phone Number: 09/16/2019, 4:05 PM  Clinical Narrative:   Case manager has been working to arrange oxygen delivery for patient. His wife is in rehab facility, daughter working so no one is available to accept concentrator and tanks. Patient says that he can get a neighbor to accept, they are not answering the phone. Referral for oxygen was called to [facility] with [employer]. Plan now is to arrange delivery for the morning, patient will attempt to reach neighbor. Case manager will arrange for PTAR once we have contacted neighbor. CM contacted [facility] to confirm patient can resume HHRN. Will fax orders to them at [phone].      Expected Discharge Plan: [place] [facility] Barriers to Discharge: Other (comment)(coordination of oxygen delivery. patient has no one to accept)  Expected Discharge Plan and Services Expected Discharge Plan: [place] Choice: Durable Medical Equipment, Home Health, Resumption of Svcs/PTA Provider Living arrangements for the past 2 months: Single Family Home Expected Discharge Date: 09/16/19               DME Arranged: Oxygen DME Agency: [employer] [facility] Date DME Agency Contacted: 09/16/19 Time DME Agency Contacted: 1500 Representative spoke with at DME Agency: Caryl Pina HH Arranged: PT, OT, RN [facility] Agency: [place] Date [place]: 09/16/19 Time [place]: [id number]     Social Determinants of Health (SDOH) Interventions    Readmission Risk Interventions No flowsheet data found.

## 2019-09-16 NOTE — Progress Notes (Signed)
SATURATION QUALIFICATIONS: (This note is used to comply with regulatory documentation for home oxygen)  Patient Saturations on Room Air at Rest = 87%  Patient Saturations on Room Air while Ambulating = 83%  Patient Saturations on 3 Liters of oxygen while Ambulating = 90%  Please briefly explain why patient needs home oxygen:  Desats when at rest and ambulating on room air.

## 2019-09-17 LAB — GLUCOSE, CAPILLARY
Glucose-Capillary: 150 mg/dL — ABNORMAL HIGH (ref 70–99)
Glucose-Capillary: 119 mg/dL — ABNORMAL HIGH (ref 70–99)

## 2019-09-17 NOTE — Progress Notes (Signed)
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 Plans on help from stepdaughter and a neighbor for general help- ie groceries.       Follow Up Recommendations: He states that he does not want anyone else coming in to his home other than the representative to check his oxygen equipment= and related needs.       Equipment Recommendations- He states he has all necessary equipment.        Recommendations for Other Services       Precautions / Restrictions Precautions Precautions: Fall Restrictions Weight Bearing Restrictions: No    Mobility  Bed Mobility                  Transfers   Equipment used: Rolling walker (2 wheeled) Transfers: Sit to/from Stand Sit to Stand: Supervision            Ambulation/Gait Ambulation/Gait assistance: Supervision Gait Distance (Feet): 15 Feet Assistive device: Rolling walker (2 wheeled) Gait Pattern/deviations: Shuffle;Wide base of support         Stairs             Wheelchair Mobility    Modified Rankin (Stroke Patients Only)       Balance Overall balance assessment: Modified Independent Sitting-balance support: Bilateral upper extremity supported Sitting balance-Leahy Scale: Good     Standing balance support: Bilateral upper extremity supported Standing balance-Leahy Scale: Good                              Cognition Arousal/Alertness: Awake/alert Behavior During Therapy: WFL for tasks assessed/performed Overall Cognitive Status: Within Functional Limits for tasks assessed                                 General Comments: He does plan to go home and tentatively it will be today. Per Case manager, the oxygen is being delivered  this  morning. Of concern is that he has no one at home (states a stepdaughter and a neighbor can assist as needed)      Exercises General Exercises - Lower Extremity Ankle Circles/Pumps: AROM Quad Sets: AROM Gluteal Sets: AROM Short Arc Quad: AROM Hip Flexion/Marching: AROM Other Exercises Other Exercises: Practiced IS and only able to achieve 350 consistently. Did also return demo flutter.    General Comments General comments (skin integrity, edema, etc.): He has wounds on both LEs with bulky dressings and ace wraps in place. He has been followed by a [place], and states he plans to continue to do this- DX chronic stasis ulcers      Pertinent Vitals/Pain Pain Assessment: No/denies pain    Home Living                      Prior Function            PT Goals (current goals can now be found in the care plan section) Acute Rehab PT Goals Patient Stated Goal: Wants to return to home and be as independent as possible.  Will be on supplement oxygen- currently on 3 LPM, [place] Progress towards PT goals:  Progressing toward goals    Frequency           PT Plan Current plan remains appropriate    Co-evaluation     PT goals addressed during session: Strengthening/ROM;Mobility/safety with mobility(Reviewed incentive spirometer and Flutter)        AM-PAC PT "6 Clicks" Mobility   Outcome Measure  Help needed turning from your back to your side while in a flat bed without using bedrails?: None Help needed moving from lying on your back to sitting on the side of a flat bed without using bedrails?: None Help needed moving to and from a bed to a chair (including a wheelchair)?: None Help needed standing up from a chair using your arms (e.g., wheelchair or bedside chair)?: A Little Help needed to walk in hospital room?: A Little Help needed climbing 3-5 steps with a railing? : A Lot 6 Click Score: 20    End of Session Equipment Utilized During Treatment: Gait  belt Activity Tolerance: Patient tolerated treatment well Patient left: in chair;with call bell/phone within reach         Time: 1042-1128 PT Time Calculation (min) (ACUTE ONLY): 46 min  Charges:  $Gait Training: 8-22 mins $Therapeutic Exercise: 8-22 mins $Therapeutic Activity: 8-22 mins                     Torrion Witter P, PT # [phone] CGV cell    Casandra Doffing 09/17/2019, 11:53 AM

## 2019-09-17 NOTE — Consult Note (Addendum)
[place] Nurse Consult Note: Reason for Consult: Bilateral LE care. Patient seen by my partner last week on 09/10/19.  Wound type: Chronic venous insufficiency/lymphedema Pressure Injury POA: N/A Measurement: Not seen today Wound bed:Not seen today Drainage (amount, consistency, odor) Not observed Periwound:Not observed Dressing procedure/placement/frequency: Patient is preferring to have topical wound care provided by [facility] rather than have dressings changed in house.  I have contacted the outpatient wound care center and discussed with staff nurse Destiny, RN at the [facility] and she reports that at the last visit patient was discharged, noting that he has chronic dry, crusting and that he is followed by the [facility]. She further notes that he is to use his lymphedema pumps as previously directed by Dr. Dellia Nims and practice good hygiene.   Patient was discharged yesterday and is pending discharge today top his home providing that oxygen delivery can be arranged.   I have communicated with bedside RN, A. Paul and Dr. Aileen Fass the above recommendation to forgo dressing change per patient preference via [employer]. If patient agrees and situation for discharge changes, wound care orders are on the EMR and bedside RN, A. Eddie Dibbles will apply.  [place] nursing team will not follow, but will remain available to this patient, the nursing and medical teams.  Please re-consult if needed.  Thank you. Maudie Flakes, MSN, RN, [place], Arther Abbott  Pager# [phone]

## 2019-09-17 NOTE — TOC Transition Note (Signed)
Transition of Care [facility]) - CM/SW Discharge Note   Patient Details  Name: [name] [name] MRN: [id number] Date of Birth: 31-May-1956  Transition [facility] CM/SW Contact:  Ninfa Meeker, RN Phone Number: 09/17/2019, 11:49 AM   Clinical Narrative:   Case manager confirmed with patient that someone will be available to sign for oxygen delivery at his Home. Patient states his neighbor-Carla Porter-[phone] will be available. Case manager provided this information to Enzo Montgomery, [place]. , they will deliver concentrator and tanks shortly. Ashley is aware of patient's discharge, orders and Face to Face to be faxed again to [phone]. Case manager will arrange for patient's PTAR transport home. Medical necessity form and face sheet have been printed to the nursing unit. Case manager will update bedside RN.     Final next level of care: Home w Home Health Services Barriers to Discharge: Other (comment)(coordination of oxygen delivery. patient has no one to accept)   Patient Goals and CMS Choice Patient states their goals for this hospitalization and ongoing recovery are:: get better      Discharge Placement                       Discharge Plan and Services     Post Acute Care Choice: Durable Medical Equipment, Home Health, Resumption of Svcs/PTA Provider          DME Arranged: Oxygen DME Agency: [employer] Date DME Agency Contacted: 09/16/19 Time DME Agency Contacted: 1500 Representative spoke with at DME Agency: Caryl Pina HH Arranged: PT, OT, RN [facility] Agency: [place] Date [place]: 09/16/19 Time [place]: [id number]    Social Determinants of Health (SDOH) Interventions     Readmission Risk Interventions No flowsheet data found.

## 2019-09-17 NOTE — Progress Notes (Signed)
[facility] PROGRESS NOTE    Progress Note  [name] [name]  [id number] DOB: 07-23-1956 DOA: 09/09/2019 PCP: Manon Hilding, MD     Brief Narrative:   [name] [name] is an 63 y.o. male  with history of occupational asbestos exposure, chronic diastolic heart failure, hypertension, history of TIA, chronic venous stasis ulcers and obesity presented to the emergency department for evaluation of chills, nausea, vomiting and shortness of breath. Patient had started symptoms on 09/05/2019. In the emergency room, he was found to be saturating 80% on room air, treated with 6 L of nasal cannula oxygen and admitted to the hospital.  Assessment/Plan:   Principal Problem:   Pneumonia due to COVID-19 virus Active Problems:   Venous stasis ulcers (HCC)   Essential hypertension   Chronic diastolic heart failure (HCC)   Hypokalemia   Acute respiratory failure with hypoxia (HCC) No changes overnight the patient had to see an extra day in house as nobody was able to be home to receive the equipment. Patient remained in stable condition on 3 L of oxygen he will go home on 3 L of oxygen for 2 weeks.  DVT prophylaxis: lovenox Family Communication:none Disposition Plan/Barrier to D/C: home today Code Status:     Code Status Orders  (From admission, onward)         Start     Ordered   09/09/19 0504  Full code  Continuous     09/09/19 0503        Code Status History    This patient has a current code status but no historical code status.   Advance Care Planning Activity        IV Access:    Peripheral IV   Procedures and diagnostic studies:   No results found.   Medical Consultants:    None.  Anti-Infectives:   None  Subjective:    [name] [name] has no new complaints he does want to go home this morning.  Objective:    Vitals:   09/16/19 1624 09/16/19 2000 09/17/19 0000 09/17/19 0400  BP:  114/79 124/73 121/73  Pulse:  67 69 67  Resp:  18 19  17   Temp: 98.5 F (36.9 C) 97.9 F (36.6 C) 98.8 F (37.1 C) 98.1 F (36.7 C)  TempSrc: Oral Oral Oral Oral  SpO2:  93% 96% 96%  Weight:      Height:       SpO2: 96 % O2 Flow Rate (L/min): 3 L/min   Intake/Output Summary (Last 24 hours) at 09/17/2019 0849 Last data filed at 09/17/2019 0500 Gross per 24 hour  Intake 480 ml  Output 1500 ml  Net -1020 ml   Filed Weights   09/09/19 0153  Weight: (!) 163.3 kg    Exam:  Data Reviewed:    Labs: Basic Metabolic Panel: Recent Labs  Lab 09/11/19 0235 09/12/19 1213 09/13/19 0447 09/14/19 0311 09/16/19 0420  NA 135 134* 138 133*  --   K 3.6 3.5 3.7 3.7  --   CL 91* 91* 92* 88*  --   CO2 30 28 33* 32  --   GLUCOSE 183* 299* 150* 177*  --   BUN 32* 38* 45* 43*  --   CREATININE 0.97 1.16 1.12 1.13 1.28*  CALCIUM 8.7* 8.2* 8.9 8.5*  --   MG 1.8 1.9 2.3 2.1  --    GFR Estimated Creatinine Clearance: 90 mL/min (A) (by C-G formula based on SCr of 1.28 mg/dL (H)).  Liver Function Tests: Recent Labs  Lab 09/11/19 0235 09/12/19 1213 09/13/19 0447 09/14/19 0311  AST 18 26 29 27   ALT 15 23 27 25   ALKPHOS 57 54 59 66  BILITOT 0.7 0.6 0.4 0.6  PROT 6.9 6.5 6.9 6.8  ALBUMIN 3.2* 2.9* 3.0* 2.9*   No results for input(s): LIPASE, AMYLASE in the last 168 hours. No results for input(s): AMMONIA in the last 168 hours. Coagulation profile No results for input(s): INR, PROTIME in the last 168 hours. COVID-19 Labs  No results for input(s): DDIMER, FERRITIN, LDH, CRP in the last 72 hours.  No results found for: SARSCOV2NAA  CBC: Recent Labs  Lab 09/11/19 0235 09/12/19 1213 09/13/19 0447 09/14/19 0311  WBC 10.3 8.6 11.3* 13.2*  NEUTROABS 8.6* 7.8* 8.7* 10.6*  HGB 13.0 12.6* 13.4 12.9*  HCT 42.5 41.9 44.5 42.5  MCV 80.0 79.8* 80.0 79.6*  PLT 191 230 222 226   Cardiac Enzymes: No results for input(s): CKTOTAL, CKMB, CKMBINDEX, TROPONINI in the last 168 hours. BNP (last 3 results) No results for input(s): PROBNP  in the last 8760 hours. CBG: Recent Labs  Lab 09/16/19 0826 09/16/19 1215 09/16/19 1612 09/16/19 2020 09/17/19 0759  GLUCAP 136* 131* 174* 157* 150*   D-Dimer: No results for input(s): DDIMER in the last 72 hours. Hgb A1c: No results for input(s): HGBA1C in the last 72 hours. Lipid Profile: No results for input(s): CHOL, HDL, LDLCALC, TRIG, CHOLHDL, LDLDIRECT in the last 72 hours. Thyroid function studies: No results for input(s): TSH, T4TOTAL, T3FREE, THYROIDAB in the last 72 hours.  Invalid input(s): FREET3 Anemia work up: No results for input(s): VITAMINB12, FOLATE, FERRITIN, TIBC, IRON, RETICCTPCT in the last 72 hours. Sepsis Labs: Recent Labs  Lab 09/11/19 0235 09/12/19 1213 09/13/19 0447 09/14/19 0311  WBC 10.3 8.6 11.3* 13.2*   Microbiology Recent Results (from the past 240 hour(s))  Blood Culture (routine x 2)     Status: None   Collection Time: 09/09/19  2:49 AM   Specimen: BLOOD  Result Value Ref Range Status   Specimen Description BLOOD LEFT ANTECUBITAL  Final   Special Requests   Final    BOTTLES DRAWN AEROBIC AND ANAEROBIC Blood Culture adequate volume   Culture   Final    NO GROWTH 5 DAYS Performed at [facility], [address] [place], [place] [address]    Report Status 09/14/2019 FINAL  Final  Blood Culture (routine x 2)     Status: None   Collection Time: 09/09/19  3:00 AM   Specimen: BLOOD RIGHT HAND  Result Value Ref Range Status   Specimen Description BLOOD RIGHT HAND  Final   Special Requests   Final    BOTTLES DRAWN AEROBIC AND ANAEROBIC Blood Culture adequate volume   Culture   Final    NO GROWTH 5 DAYS Performed at [facility], [address], [place], [place] [address]    Report Status 09/14/2019 FINAL  Final     Medications:   . allopurinol  300 mg Oral Daily  . aspirin EC  81 mg Oral Daily  . atorvastatin  10 mg Oral Daily  . clopidogrel  75 mg Oral Daily  . dexamethasone (DECADRON) injection  6 mg Intravenous Q24H  .  enoxaparin (LOVENOX) injection  80 mg Subcutaneous Q24H  . furosemide  40 mg Oral BID  . hydrALAZINE  50 mg Oral TID  . hydrochlorothiazide  25 mg Oral Daily  . insulin aspart  0-15 Units Subcutaneous TID  WC  . insulin aspart  0-5 Units Subcutaneous QHS  . irbesartan  300 mg Oral Daily  . magnesium oxide  400 mg Oral BID  . mouth rinse  15 mL Mouth Rinse BID  . metoprolol succinate  100 mg Oral Daily  . sodium chloride flush  3 mL Intravenous Q12H  . sodium chloride flush  3 mL Intravenous Q12H   Continuous Infusions: . sodium chloride        LOS: 8 days   Charlynne Cousins  [facility]  09/17/2019, 8:49 AM

## 2019-09-17 NOTE — Progress Notes (Signed)
Pt is refusing wound care on BLE. Stated that he is to go home today and will have it taken care of at his wound clinic. Pt educated on why wound care needed to be done. He then stated that if he did not go home today he would get it done today here on the unit.

## 2019-09-17 NOTE — Progress Notes (Signed)
Pt A&Ox4 OOB to recliner, denies pain or discomfort, O2@3l /min via [place], Sats in the mid 90s, no SOB or resp distress noted, dressing to BLE intact. Discharge instructions given with understanding, PIV removed from right AC, bleeding noted pressure dressing applied. Pleasant and co-op with care. Pt picked up @ 1444 by PTAR  To be transported home.

## 2019-09-20 DIAGNOSIS — I872 Venous insufficiency (chronic) (peripheral): Secondary | ICD-10-CM | POA: Diagnosis not present

## 2019-09-20 DIAGNOSIS — I110 Hypertensive heart disease with heart failure: Secondary | ICD-10-CM | POA: Diagnosis not present

## 2019-09-20 DIAGNOSIS — U071 COVID-19: Secondary | ICD-10-CM | POA: Diagnosis not present

## 2019-09-20 DIAGNOSIS — E669 Obesity, unspecified: Secondary | ICD-10-CM | POA: Diagnosis not present

## 2019-09-20 DIAGNOSIS — I5032 Chronic diastolic (congestive) heart failure: Secondary | ICD-10-CM | POA: Diagnosis not present

## 2019-09-20 DIAGNOSIS — Z8673 Personal history of transient ischemic attack (TIA), and cerebral infarction without residual deficits: Secondary | ICD-10-CM | POA: Diagnosis not present

## 2019-09-20 DIAGNOSIS — L97219 Non-pressure chronic ulcer of right calf with unspecified severity: Secondary | ICD-10-CM | POA: Diagnosis not present

## 2019-09-20 DIAGNOSIS — J9601 Acute respiratory failure with hypoxia: Secondary | ICD-10-CM | POA: Diagnosis not present

## 2019-09-20 DIAGNOSIS — L97828 Non-pressure chronic ulcer of other part of left lower leg with other specified severity: Secondary | ICD-10-CM | POA: Diagnosis not present

## 2019-09-20 DIAGNOSIS — Z9981 Dependence on supplemental oxygen: Secondary | ICD-10-CM | POA: Diagnosis not present

## 2019-09-20 DIAGNOSIS — G4733 Obstructive sleep apnea (adult) (pediatric): Secondary | ICD-10-CM | POA: Diagnosis not present

## 2019-09-20 DIAGNOSIS — J1282 Pneumonia due to coronavirus disease 2019: Secondary | ICD-10-CM | POA: Diagnosis not present

## 2019-09-23 DIAGNOSIS — J1282 Pneumonia due to coronavirus disease 2019: Secondary | ICD-10-CM | POA: Diagnosis not present

## 2019-09-23 DIAGNOSIS — L97828 Non-pressure chronic ulcer of other part of left lower leg with other specified severity: Secondary | ICD-10-CM | POA: Diagnosis not present

## 2019-09-23 DIAGNOSIS — L97219 Non-pressure chronic ulcer of right calf with unspecified severity: Secondary | ICD-10-CM | POA: Diagnosis not present

## 2019-09-23 DIAGNOSIS — U071 COVID-19: Secondary | ICD-10-CM | POA: Diagnosis not present

## 2019-09-23 DIAGNOSIS — J9601 Acute respiratory failure with hypoxia: Secondary | ICD-10-CM | POA: Diagnosis not present

## 2019-09-23 DIAGNOSIS — I872 Venous insufficiency (chronic) (peripheral): Secondary | ICD-10-CM | POA: Diagnosis not present

## 2019-09-25 DIAGNOSIS — L97219 Non-pressure chronic ulcer of right calf with unspecified severity: Secondary | ICD-10-CM | POA: Diagnosis not present

## 2019-09-25 DIAGNOSIS — I872 Venous insufficiency (chronic) (peripheral): Secondary | ICD-10-CM | POA: Diagnosis not present

## 2019-09-25 DIAGNOSIS — J1282 Pneumonia due to coronavirus disease 2019: Secondary | ICD-10-CM | POA: Diagnosis not present

## 2019-09-25 DIAGNOSIS — J9601 Acute respiratory failure with hypoxia: Secondary | ICD-10-CM | POA: Diagnosis not present

## 2019-09-25 DIAGNOSIS — L97828 Non-pressure chronic ulcer of other part of left lower leg with other specified severity: Secondary | ICD-10-CM | POA: Diagnosis not present

## 2019-09-25 DIAGNOSIS — U071 COVID-19: Secondary | ICD-10-CM | POA: Diagnosis not present

## 2019-09-26 DIAGNOSIS — L97828 Non-pressure chronic ulcer of other part of left lower leg with other specified severity: Secondary | ICD-10-CM | POA: Diagnosis not present

## 2019-09-26 DIAGNOSIS — L97219 Non-pressure chronic ulcer of right calf with unspecified severity: Secondary | ICD-10-CM | POA: Diagnosis not present

## 2019-09-26 DIAGNOSIS — U071 COVID-19: Secondary | ICD-10-CM | POA: Diagnosis not present

## 2019-09-26 DIAGNOSIS — J9601 Acute respiratory failure with hypoxia: Secondary | ICD-10-CM | POA: Diagnosis not present

## 2019-09-26 DIAGNOSIS — I872 Venous insufficiency (chronic) (peripheral): Secondary | ICD-10-CM | POA: Diagnosis not present

## 2019-09-26 DIAGNOSIS — J1282 Pneumonia due to coronavirus disease 2019: Secondary | ICD-10-CM | POA: Diagnosis not present

## 2019-09-29 DIAGNOSIS — J1282 Pneumonia due to coronavirus disease 2019: Secondary | ICD-10-CM | POA: Diagnosis not present

## 2019-09-29 DIAGNOSIS — J9601 Acute respiratory failure with hypoxia: Secondary | ICD-10-CM | POA: Diagnosis not present

## 2019-09-29 DIAGNOSIS — L97219 Non-pressure chronic ulcer of right calf with unspecified severity: Secondary | ICD-10-CM | POA: Diagnosis not present

## 2019-09-29 DIAGNOSIS — L97828 Non-pressure chronic ulcer of other part of left lower leg with other specified severity: Secondary | ICD-10-CM | POA: Diagnosis not present

## 2019-09-29 DIAGNOSIS — U071 COVID-19: Secondary | ICD-10-CM | POA: Diagnosis not present

## 2019-09-29 DIAGNOSIS — I872 Venous insufficiency (chronic) (peripheral): Secondary | ICD-10-CM | POA: Diagnosis not present

## 2019-10-01 DIAGNOSIS — L97828 Non-pressure chronic ulcer of other part of left lower leg with other specified severity: Secondary | ICD-10-CM | POA: Diagnosis not present

## 2019-10-01 DIAGNOSIS — L97219 Non-pressure chronic ulcer of right calf with unspecified severity: Secondary | ICD-10-CM | POA: Diagnosis not present

## 2019-10-01 DIAGNOSIS — J9601 Acute respiratory failure with hypoxia: Secondary | ICD-10-CM | POA: Diagnosis not present

## 2019-10-01 DIAGNOSIS — I872 Venous insufficiency (chronic) (peripheral): Secondary | ICD-10-CM | POA: Diagnosis not present

## 2019-10-01 DIAGNOSIS — J1282 Pneumonia due to coronavirus disease 2019: Secondary | ICD-10-CM | POA: Diagnosis not present

## 2019-10-01 DIAGNOSIS — U071 COVID-19: Secondary | ICD-10-CM | POA: Diagnosis not present

## 2019-10-03 DIAGNOSIS — L97828 Non-pressure chronic ulcer of other part of left lower leg with other specified severity: Secondary | ICD-10-CM | POA: Diagnosis not present

## 2019-10-03 DIAGNOSIS — J1282 Pneumonia due to coronavirus disease 2019: Secondary | ICD-10-CM | POA: Diagnosis not present

## 2019-10-03 DIAGNOSIS — U071 COVID-19: Secondary | ICD-10-CM | POA: Diagnosis not present

## 2019-10-03 DIAGNOSIS — I872 Venous insufficiency (chronic) (peripheral): Secondary | ICD-10-CM | POA: Diagnosis not present

## 2019-10-03 DIAGNOSIS — L97219 Non-pressure chronic ulcer of right calf with unspecified severity: Secondary | ICD-10-CM | POA: Diagnosis not present

## 2019-10-03 DIAGNOSIS — J9601 Acute respiratory failure with hypoxia: Secondary | ICD-10-CM | POA: Diagnosis not present

## 2019-10-06 DIAGNOSIS — L97219 Non-pressure chronic ulcer of right calf with unspecified severity: Secondary | ICD-10-CM | POA: Diagnosis not present

## 2019-10-06 DIAGNOSIS — I872 Venous insufficiency (chronic) (peripheral): Secondary | ICD-10-CM | POA: Diagnosis not present

## 2019-10-06 DIAGNOSIS — L97828 Non-pressure chronic ulcer of other part of left lower leg with other specified severity: Secondary | ICD-10-CM | POA: Diagnosis not present

## 2019-10-06 DIAGNOSIS — J9601 Acute respiratory failure with hypoxia: Secondary | ICD-10-CM | POA: Diagnosis not present

## 2019-10-06 DIAGNOSIS — J1282 Pneumonia due to coronavirus disease 2019: Secondary | ICD-10-CM | POA: Diagnosis not present

## 2019-10-06 DIAGNOSIS — U071 COVID-19: Secondary | ICD-10-CM | POA: Diagnosis not present

## 2019-10-08 DIAGNOSIS — L97828 Non-pressure chronic ulcer of other part of left lower leg with other specified severity: Secondary | ICD-10-CM | POA: Diagnosis not present

## 2019-10-08 DIAGNOSIS — U071 COVID-19: Secondary | ICD-10-CM | POA: Diagnosis not present

## 2019-10-08 DIAGNOSIS — L97219 Non-pressure chronic ulcer of right calf with unspecified severity: Secondary | ICD-10-CM | POA: Diagnosis not present

## 2019-10-08 DIAGNOSIS — J9601 Acute respiratory failure with hypoxia: Secondary | ICD-10-CM | POA: Diagnosis not present

## 2019-10-08 DIAGNOSIS — I872 Venous insufficiency (chronic) (peripheral): Secondary | ICD-10-CM | POA: Diagnosis not present

## 2019-10-08 DIAGNOSIS — J1282 Pneumonia due to coronavirus disease 2019: Secondary | ICD-10-CM | POA: Diagnosis not present

## 2019-10-10 DIAGNOSIS — U071 COVID-19: Secondary | ICD-10-CM | POA: Diagnosis not present

## 2019-10-10 DIAGNOSIS — L97219 Non-pressure chronic ulcer of right calf with unspecified severity: Secondary | ICD-10-CM | POA: Diagnosis not present

## 2019-10-10 DIAGNOSIS — L97828 Non-pressure chronic ulcer of other part of left lower leg with other specified severity: Secondary | ICD-10-CM | POA: Diagnosis not present

## 2019-10-10 DIAGNOSIS — I872 Venous insufficiency (chronic) (peripheral): Secondary | ICD-10-CM | POA: Diagnosis not present

## 2019-10-10 DIAGNOSIS — J9601 Acute respiratory failure with hypoxia: Secondary | ICD-10-CM | POA: Diagnosis not present

## 2019-10-10 DIAGNOSIS — J1282 Pneumonia due to coronavirus disease 2019: Secondary | ICD-10-CM | POA: Diagnosis not present

## 2019-10-14 DIAGNOSIS — U071 COVID-19: Secondary | ICD-10-CM | POA: Diagnosis not present

## 2019-10-14 DIAGNOSIS — L97828 Non-pressure chronic ulcer of other part of left lower leg with other specified severity: Secondary | ICD-10-CM | POA: Diagnosis not present

## 2019-10-14 DIAGNOSIS — J1282 Pneumonia due to coronavirus disease 2019: Secondary | ICD-10-CM | POA: Diagnosis not present

## 2019-10-14 DIAGNOSIS — L97219 Non-pressure chronic ulcer of right calf with unspecified severity: Secondary | ICD-10-CM | POA: Diagnosis not present

## 2019-10-14 DIAGNOSIS — I872 Venous insufficiency (chronic) (peripheral): Secondary | ICD-10-CM | POA: Diagnosis not present

## 2019-10-14 DIAGNOSIS — J9601 Acute respiratory failure with hypoxia: Secondary | ICD-10-CM | POA: Diagnosis not present

## 2019-10-15 DIAGNOSIS — L97219 Non-pressure chronic ulcer of right calf with unspecified severity: Secondary | ICD-10-CM | POA: Diagnosis not present

## 2019-10-15 DIAGNOSIS — L97828 Non-pressure chronic ulcer of other part of left lower leg with other specified severity: Secondary | ICD-10-CM | POA: Diagnosis not present

## 2019-10-15 DIAGNOSIS — J1282 Pneumonia due to coronavirus disease 2019: Secondary | ICD-10-CM | POA: Diagnosis not present

## 2019-10-15 DIAGNOSIS — I872 Venous insufficiency (chronic) (peripheral): Secondary | ICD-10-CM | POA: Diagnosis not present

## 2019-10-15 DIAGNOSIS — J9601 Acute respiratory failure with hypoxia: Secondary | ICD-10-CM | POA: Diagnosis not present

## 2019-10-15 DIAGNOSIS — U071 COVID-19: Secondary | ICD-10-CM | POA: Diagnosis not present

## 2019-10-16 DIAGNOSIS — L03115 Cellulitis of right lower limb: Secondary | ICD-10-CM | POA: Diagnosis not present

## 2019-10-16 DIAGNOSIS — U071 COVID-19: Secondary | ICD-10-CM | POA: Diagnosis not present

## 2019-10-16 DIAGNOSIS — R05 Cough: Secondary | ICD-10-CM | POA: Diagnosis not present

## 2019-10-16 DIAGNOSIS — I5033 Acute on chronic diastolic (congestive) heart failure: Secondary | ICD-10-CM | POA: Diagnosis not present

## 2019-10-16 DIAGNOSIS — R0902 Hypoxemia: Secondary | ICD-10-CM | POA: Diagnosis not present

## 2019-10-17 DIAGNOSIS — U071 COVID-19: Secondary | ICD-10-CM | POA: Diagnosis not present

## 2019-10-17 DIAGNOSIS — J1282 Pneumonia due to coronavirus disease 2019: Secondary | ICD-10-CM | POA: Diagnosis not present

## 2019-10-17 DIAGNOSIS — I872 Venous insufficiency (chronic) (peripheral): Secondary | ICD-10-CM | POA: Diagnosis not present

## 2019-10-17 DIAGNOSIS — L97828 Non-pressure chronic ulcer of other part of left lower leg with other specified severity: Secondary | ICD-10-CM | POA: Diagnosis not present

## 2019-10-17 DIAGNOSIS — L97219 Non-pressure chronic ulcer of right calf with unspecified severity: Secondary | ICD-10-CM | POA: Diagnosis not present

## 2019-10-17 DIAGNOSIS — J9601 Acute respiratory failure with hypoxia: Secondary | ICD-10-CM | POA: Diagnosis not present

## 2019-10-17 DIAGNOSIS — I10 Essential (primary) hypertension: Secondary | ICD-10-CM | POA: Diagnosis not present

## 2019-10-17 DIAGNOSIS — E7800 Pure hypercholesterolemia, unspecified: Secondary | ICD-10-CM | POA: Diagnosis not present

## 2019-10-20 DIAGNOSIS — J9601 Acute respiratory failure with hypoxia: Secondary | ICD-10-CM | POA: Diagnosis not present

## 2019-10-20 DIAGNOSIS — E669 Obesity, unspecified: Secondary | ICD-10-CM | POA: Diagnosis not present

## 2019-10-20 DIAGNOSIS — Z9981 Dependence on supplemental oxygen: Secondary | ICD-10-CM | POA: Diagnosis not present

## 2019-10-20 DIAGNOSIS — Z8673 Personal history of transient ischemic attack (TIA), and cerebral infarction without residual deficits: Secondary | ICD-10-CM | POA: Diagnosis not present

## 2019-10-20 DIAGNOSIS — I110 Hypertensive heart disease with heart failure: Secondary | ICD-10-CM | POA: Diagnosis not present

## 2019-10-20 DIAGNOSIS — I872 Venous insufficiency (chronic) (peripheral): Secondary | ICD-10-CM | POA: Diagnosis not present

## 2019-10-20 DIAGNOSIS — G4733 Obstructive sleep apnea (adult) (pediatric): Secondary | ICD-10-CM | POA: Diagnosis not present

## 2019-10-20 DIAGNOSIS — L97219 Non-pressure chronic ulcer of right calf with unspecified severity: Secondary | ICD-10-CM | POA: Diagnosis not present

## 2019-10-20 DIAGNOSIS — L97828 Non-pressure chronic ulcer of other part of left lower leg with other specified severity: Secondary | ICD-10-CM | POA: Diagnosis not present

## 2019-10-20 DIAGNOSIS — I5032 Chronic diastolic (congestive) heart failure: Secondary | ICD-10-CM | POA: Diagnosis not present

## 2019-10-20 DIAGNOSIS — U071 COVID-19: Secondary | ICD-10-CM | POA: Diagnosis not present

## 2019-10-20 DIAGNOSIS — J1282 Pneumonia due to coronavirus disease 2019: Secondary | ICD-10-CM | POA: Diagnosis not present

## 2019-10-21 DIAGNOSIS — J1282 Pneumonia due to coronavirus disease 2019: Secondary | ICD-10-CM | POA: Diagnosis not present

## 2019-10-21 DIAGNOSIS — L97828 Non-pressure chronic ulcer of other part of left lower leg with other specified severity: Secondary | ICD-10-CM | POA: Diagnosis not present

## 2019-10-21 DIAGNOSIS — J9601 Acute respiratory failure with hypoxia: Secondary | ICD-10-CM | POA: Diagnosis not present

## 2019-10-21 DIAGNOSIS — I872 Venous insufficiency (chronic) (peripheral): Secondary | ICD-10-CM | POA: Diagnosis not present

## 2019-10-21 DIAGNOSIS — U071 COVID-19: Secondary | ICD-10-CM | POA: Diagnosis not present

## 2019-10-21 DIAGNOSIS — L97219 Non-pressure chronic ulcer of right calf with unspecified severity: Secondary | ICD-10-CM | POA: Diagnosis not present

## 2019-10-24 DIAGNOSIS — L97219 Non-pressure chronic ulcer of right calf with unspecified severity: Secondary | ICD-10-CM | POA: Diagnosis not present

## 2019-10-24 DIAGNOSIS — I872 Venous insufficiency (chronic) (peripheral): Secondary | ICD-10-CM | POA: Diagnosis not present

## 2019-10-24 DIAGNOSIS — L97828 Non-pressure chronic ulcer of other part of left lower leg with other specified severity: Secondary | ICD-10-CM | POA: Diagnosis not present

## 2019-10-24 DIAGNOSIS — J9601 Acute respiratory failure with hypoxia: Secondary | ICD-10-CM | POA: Diagnosis not present

## 2019-10-24 DIAGNOSIS — J1282 Pneumonia due to coronavirus disease 2019: Secondary | ICD-10-CM | POA: Diagnosis not present

## 2019-10-24 DIAGNOSIS — U071 COVID-19: Secondary | ICD-10-CM | POA: Diagnosis not present

## 2019-10-27 DIAGNOSIS — I872 Venous insufficiency (chronic) (peripheral): Secondary | ICD-10-CM | POA: Diagnosis not present

## 2019-10-27 DIAGNOSIS — L97219 Non-pressure chronic ulcer of right calf with unspecified severity: Secondary | ICD-10-CM | POA: Diagnosis not present

## 2019-10-27 DIAGNOSIS — U071 COVID-19: Secondary | ICD-10-CM | POA: Diagnosis not present

## 2019-10-27 DIAGNOSIS — J1282 Pneumonia due to coronavirus disease 2019: Secondary | ICD-10-CM | POA: Diagnosis not present

## 2019-10-27 DIAGNOSIS — L97828 Non-pressure chronic ulcer of other part of left lower leg with other specified severity: Secondary | ICD-10-CM | POA: Diagnosis not present

## 2019-10-27 DIAGNOSIS — J9601 Acute respiratory failure with hypoxia: Secondary | ICD-10-CM | POA: Diagnosis not present

## 2019-10-28 DIAGNOSIS — J9601 Acute respiratory failure with hypoxia: Secondary | ICD-10-CM | POA: Diagnosis not present

## 2019-10-28 DIAGNOSIS — J1282 Pneumonia due to coronavirus disease 2019: Secondary | ICD-10-CM | POA: Diagnosis not present

## 2019-10-28 DIAGNOSIS — U071 COVID-19: Secondary | ICD-10-CM | POA: Diagnosis not present

## 2019-10-28 DIAGNOSIS — L97828 Non-pressure chronic ulcer of other part of left lower leg with other specified severity: Secondary | ICD-10-CM | POA: Diagnosis not present

## 2019-10-28 DIAGNOSIS — I872 Venous insufficiency (chronic) (peripheral): Secondary | ICD-10-CM | POA: Diagnosis not present

## 2019-10-28 DIAGNOSIS — L97219 Non-pressure chronic ulcer of right calf with unspecified severity: Secondary | ICD-10-CM | POA: Diagnosis not present

## 2019-10-30 DIAGNOSIS — J9601 Acute respiratory failure with hypoxia: Secondary | ICD-10-CM | POA: Diagnosis not present

## 2019-10-30 DIAGNOSIS — L97828 Non-pressure chronic ulcer of other part of left lower leg with other specified severity: Secondary | ICD-10-CM | POA: Diagnosis not present

## 2019-10-30 DIAGNOSIS — J1282 Pneumonia due to coronavirus disease 2019: Secondary | ICD-10-CM | POA: Diagnosis not present

## 2019-10-30 DIAGNOSIS — U071 COVID-19: Secondary | ICD-10-CM | POA: Diagnosis not present

## 2019-10-30 DIAGNOSIS — L97219 Non-pressure chronic ulcer of right calf with unspecified severity: Secondary | ICD-10-CM | POA: Diagnosis not present

## 2019-10-30 DIAGNOSIS — I872 Venous insufficiency (chronic) (peripheral): Secondary | ICD-10-CM | POA: Diagnosis not present

## 2019-10-31 DIAGNOSIS — I872 Venous insufficiency (chronic) (peripheral): Secondary | ICD-10-CM | POA: Diagnosis not present

## 2019-10-31 DIAGNOSIS — J9601 Acute respiratory failure with hypoxia: Secondary | ICD-10-CM | POA: Diagnosis not present

## 2019-10-31 DIAGNOSIS — U071 COVID-19: Secondary | ICD-10-CM | POA: Diagnosis not present

## 2019-10-31 DIAGNOSIS — L97219 Non-pressure chronic ulcer of right calf with unspecified severity: Secondary | ICD-10-CM | POA: Diagnosis not present

## 2019-10-31 DIAGNOSIS — L97828 Non-pressure chronic ulcer of other part of left lower leg with other specified severity: Secondary | ICD-10-CM | POA: Diagnosis not present

## 2019-10-31 DIAGNOSIS — J1282 Pneumonia due to coronavirus disease 2019: Secondary | ICD-10-CM | POA: Diagnosis not present

## 2019-11-03 DIAGNOSIS — L97828 Non-pressure chronic ulcer of other part of left lower leg with other specified severity: Secondary | ICD-10-CM | POA: Diagnosis not present

## 2019-11-03 DIAGNOSIS — J1282 Pneumonia due to coronavirus disease 2019: Secondary | ICD-10-CM | POA: Diagnosis not present

## 2019-11-03 DIAGNOSIS — J9601 Acute respiratory failure with hypoxia: Secondary | ICD-10-CM | POA: Diagnosis not present

## 2019-11-03 DIAGNOSIS — L97219 Non-pressure chronic ulcer of right calf with unspecified severity: Secondary | ICD-10-CM | POA: Diagnosis not present

## 2019-11-03 DIAGNOSIS — I872 Venous insufficiency (chronic) (peripheral): Secondary | ICD-10-CM | POA: Diagnosis not present

## 2019-11-03 DIAGNOSIS — U071 COVID-19: Secondary | ICD-10-CM | POA: Diagnosis not present

## 2019-11-04 DIAGNOSIS — J9601 Acute respiratory failure with hypoxia: Secondary | ICD-10-CM | POA: Diagnosis not present

## 2019-11-04 DIAGNOSIS — I872 Venous insufficiency (chronic) (peripheral): Secondary | ICD-10-CM | POA: Diagnosis not present

## 2019-11-04 DIAGNOSIS — U071 COVID-19: Secondary | ICD-10-CM | POA: Diagnosis not present

## 2019-11-04 DIAGNOSIS — L97828 Non-pressure chronic ulcer of other part of left lower leg with other specified severity: Secondary | ICD-10-CM | POA: Diagnosis not present

## 2019-11-04 DIAGNOSIS — L97219 Non-pressure chronic ulcer of right calf with unspecified severity: Secondary | ICD-10-CM | POA: Diagnosis not present

## 2019-11-04 DIAGNOSIS — J1282 Pneumonia due to coronavirus disease 2019: Secondary | ICD-10-CM | POA: Diagnosis not present

## 2019-11-05 DIAGNOSIS — L97219 Non-pressure chronic ulcer of right calf with unspecified severity: Secondary | ICD-10-CM | POA: Diagnosis not present

## 2019-11-05 DIAGNOSIS — I872 Venous insufficiency (chronic) (peripheral): Secondary | ICD-10-CM | POA: Diagnosis not present

## 2019-11-05 DIAGNOSIS — J9601 Acute respiratory failure with hypoxia: Secondary | ICD-10-CM | POA: Diagnosis not present

## 2019-11-05 DIAGNOSIS — L97828 Non-pressure chronic ulcer of other part of left lower leg with other specified severity: Secondary | ICD-10-CM | POA: Diagnosis not present

## 2019-11-05 DIAGNOSIS — U071 COVID-19: Secondary | ICD-10-CM | POA: Diagnosis not present

## 2019-11-05 DIAGNOSIS — J1282 Pneumonia due to coronavirus disease 2019: Secondary | ICD-10-CM | POA: Diagnosis not present

## 2019-11-07 DIAGNOSIS — L97219 Non-pressure chronic ulcer of right calf with unspecified severity: Secondary | ICD-10-CM | POA: Diagnosis not present

## 2019-11-07 DIAGNOSIS — L97828 Non-pressure chronic ulcer of other part of left lower leg with other specified severity: Secondary | ICD-10-CM | POA: Diagnosis not present

## 2019-11-07 DIAGNOSIS — I872 Venous insufficiency (chronic) (peripheral): Secondary | ICD-10-CM | POA: Diagnosis not present

## 2019-11-07 DIAGNOSIS — J9601 Acute respiratory failure with hypoxia: Secondary | ICD-10-CM | POA: Diagnosis not present

## 2019-11-07 DIAGNOSIS — J1282 Pneumonia due to coronavirus disease 2019: Secondary | ICD-10-CM | POA: Diagnosis not present

## 2019-11-07 DIAGNOSIS — U071 COVID-19: Secondary | ICD-10-CM | POA: Diagnosis not present

## 2019-11-10 DIAGNOSIS — L97219 Non-pressure chronic ulcer of right calf with unspecified severity: Secondary | ICD-10-CM | POA: Diagnosis not present

## 2019-11-10 DIAGNOSIS — L97828 Non-pressure chronic ulcer of other part of left lower leg with other specified severity: Secondary | ICD-10-CM | POA: Diagnosis not present

## 2019-11-10 DIAGNOSIS — J1282 Pneumonia due to coronavirus disease 2019: Secondary | ICD-10-CM | POA: Diagnosis not present

## 2019-11-10 DIAGNOSIS — U071 COVID-19: Secondary | ICD-10-CM | POA: Diagnosis not present

## 2019-11-10 DIAGNOSIS — J9601 Acute respiratory failure with hypoxia: Secondary | ICD-10-CM | POA: Diagnosis not present

## 2019-11-10 DIAGNOSIS — I872 Venous insufficiency (chronic) (peripheral): Secondary | ICD-10-CM | POA: Diagnosis not present

## 2019-11-11 DIAGNOSIS — I872 Venous insufficiency (chronic) (peripheral): Secondary | ICD-10-CM | POA: Diagnosis not present

## 2019-11-11 DIAGNOSIS — L97828 Non-pressure chronic ulcer of other part of left lower leg with other specified severity: Secondary | ICD-10-CM | POA: Diagnosis not present

## 2019-11-11 DIAGNOSIS — L97219 Non-pressure chronic ulcer of right calf with unspecified severity: Secondary | ICD-10-CM | POA: Diagnosis not present

## 2019-11-11 DIAGNOSIS — J9601 Acute respiratory failure with hypoxia: Secondary | ICD-10-CM | POA: Diagnosis not present

## 2019-11-11 DIAGNOSIS — U071 COVID-19: Secondary | ICD-10-CM | POA: Diagnosis not present

## 2019-11-11 DIAGNOSIS — J1282 Pneumonia due to coronavirus disease 2019: Secondary | ICD-10-CM | POA: Diagnosis not present

## 2019-11-12 DIAGNOSIS — L97828 Non-pressure chronic ulcer of other part of left lower leg with other specified severity: Secondary | ICD-10-CM | POA: Diagnosis not present

## 2019-11-12 DIAGNOSIS — L97219 Non-pressure chronic ulcer of right calf with unspecified severity: Secondary | ICD-10-CM | POA: Diagnosis not present

## 2019-11-12 DIAGNOSIS — I872 Venous insufficiency (chronic) (peripheral): Secondary | ICD-10-CM | POA: Diagnosis not present

## 2019-11-12 DIAGNOSIS — J9601 Acute respiratory failure with hypoxia: Secondary | ICD-10-CM | POA: Diagnosis not present

## 2019-11-12 DIAGNOSIS — J1282 Pneumonia due to coronavirus disease 2019: Secondary | ICD-10-CM | POA: Diagnosis not present

## 2019-11-12 DIAGNOSIS — U071 COVID-19: Secondary | ICD-10-CM | POA: Diagnosis not present

## 2019-11-14 DIAGNOSIS — E7849 Other hyperlipidemia: Secondary | ICD-10-CM | POA: Diagnosis not present

## 2019-11-14 DIAGNOSIS — J1282 Pneumonia due to coronavirus disease 2019: Secondary | ICD-10-CM | POA: Diagnosis not present

## 2019-11-14 DIAGNOSIS — J9601 Acute respiratory failure with hypoxia: Secondary | ICD-10-CM | POA: Diagnosis not present

## 2019-11-14 DIAGNOSIS — I10 Essential (primary) hypertension: Secondary | ICD-10-CM | POA: Diagnosis not present

## 2019-11-14 DIAGNOSIS — I872 Venous insufficiency (chronic) (peripheral): Secondary | ICD-10-CM | POA: Diagnosis not present

## 2019-11-14 DIAGNOSIS — L97828 Non-pressure chronic ulcer of other part of left lower leg with other specified severity: Secondary | ICD-10-CM | POA: Diagnosis not present

## 2019-11-14 DIAGNOSIS — U071 COVID-19: Secondary | ICD-10-CM | POA: Diagnosis not present

## 2019-11-14 DIAGNOSIS — L97219 Non-pressure chronic ulcer of right calf with unspecified severity: Secondary | ICD-10-CM | POA: Diagnosis not present

## 2019-11-18 DIAGNOSIS — J9601 Acute respiratory failure with hypoxia: Secondary | ICD-10-CM | POA: Diagnosis not present

## 2019-11-18 DIAGNOSIS — L97219 Non-pressure chronic ulcer of right calf with unspecified severity: Secondary | ICD-10-CM | POA: Diagnosis not present

## 2019-11-18 DIAGNOSIS — U071 COVID-19: Secondary | ICD-10-CM | POA: Diagnosis not present

## 2019-11-18 DIAGNOSIS — L97828 Non-pressure chronic ulcer of other part of left lower leg with other specified severity: Secondary | ICD-10-CM | POA: Diagnosis not present

## 2019-11-18 DIAGNOSIS — J1282 Pneumonia due to coronavirus disease 2019: Secondary | ICD-10-CM | POA: Diagnosis not present

## 2019-11-18 DIAGNOSIS — I872 Venous insufficiency (chronic) (peripheral): Secondary | ICD-10-CM | POA: Diagnosis not present

## 2019-11-19 DIAGNOSIS — Z9981 Dependence on supplemental oxygen: Secondary | ICD-10-CM | POA: Diagnosis not present

## 2019-11-19 DIAGNOSIS — I5032 Chronic diastolic (congestive) heart failure: Secondary | ICD-10-CM | POA: Diagnosis not present

## 2019-11-19 DIAGNOSIS — L97828 Non-pressure chronic ulcer of other part of left lower leg with other specified severity: Secondary | ICD-10-CM | POA: Diagnosis not present

## 2019-11-19 DIAGNOSIS — I110 Hypertensive heart disease with heart failure: Secondary | ICD-10-CM | POA: Diagnosis not present

## 2019-11-19 DIAGNOSIS — E669 Obesity, unspecified: Secondary | ICD-10-CM | POA: Diagnosis not present

## 2019-11-19 DIAGNOSIS — Z8673 Personal history of transient ischemic attack (TIA), and cerebral infarction without residual deficits: Secondary | ICD-10-CM | POA: Diagnosis not present

## 2019-11-19 DIAGNOSIS — I872 Venous insufficiency (chronic) (peripheral): Secondary | ICD-10-CM | POA: Diagnosis not present

## 2019-11-19 DIAGNOSIS — L97219 Non-pressure chronic ulcer of right calf with unspecified severity: Secondary | ICD-10-CM | POA: Diagnosis not present

## 2019-11-19 DIAGNOSIS — Z8616 Personal history of COVID-19: Secondary | ICD-10-CM | POA: Diagnosis not present

## 2019-11-19 DIAGNOSIS — G4733 Obstructive sleep apnea (adult) (pediatric): Secondary | ICD-10-CM | POA: Diagnosis not present

## 2019-11-21 DIAGNOSIS — I872 Venous insufficiency (chronic) (peripheral): Secondary | ICD-10-CM | POA: Diagnosis not present

## 2019-11-21 DIAGNOSIS — G4733 Obstructive sleep apnea (adult) (pediatric): Secondary | ICD-10-CM | POA: Diagnosis not present

## 2019-11-21 DIAGNOSIS — I110 Hypertensive heart disease with heart failure: Secondary | ICD-10-CM | POA: Diagnosis not present

## 2019-11-21 DIAGNOSIS — L97828 Non-pressure chronic ulcer of other part of left lower leg with other specified severity: Secondary | ICD-10-CM | POA: Diagnosis not present

## 2019-11-21 DIAGNOSIS — I5032 Chronic diastolic (congestive) heart failure: Secondary | ICD-10-CM | POA: Diagnosis not present

## 2019-11-21 DIAGNOSIS — L97219 Non-pressure chronic ulcer of right calf with unspecified severity: Secondary | ICD-10-CM | POA: Diagnosis not present

## 2019-11-24 DIAGNOSIS — L97219 Non-pressure chronic ulcer of right calf with unspecified severity: Secondary | ICD-10-CM | POA: Diagnosis not present

## 2019-11-24 DIAGNOSIS — I5032 Chronic diastolic (congestive) heart failure: Secondary | ICD-10-CM | POA: Diagnosis not present

## 2019-11-24 DIAGNOSIS — I872 Venous insufficiency (chronic) (peripheral): Secondary | ICD-10-CM | POA: Diagnosis not present

## 2019-11-24 DIAGNOSIS — L97828 Non-pressure chronic ulcer of other part of left lower leg with other specified severity: Secondary | ICD-10-CM | POA: Diagnosis not present

## 2019-11-24 DIAGNOSIS — I110 Hypertensive heart disease with heart failure: Secondary | ICD-10-CM | POA: Diagnosis not present

## 2019-11-24 DIAGNOSIS — G4733 Obstructive sleep apnea (adult) (pediatric): Secondary | ICD-10-CM | POA: Diagnosis not present

## 2019-11-25 ENCOUNTER — Other Ambulatory Visit: Payer: Self-pay

## 2019-11-25 ENCOUNTER — Encounter (HOSPITAL_BASED_OUTPATIENT_CLINIC_OR_DEPARTMENT_OTHER): Payer: Medicare Other | Attending: Internal Medicine | Admitting: Internal Medicine

## 2019-11-25 DIAGNOSIS — I5030 Unspecified diastolic (congestive) heart failure: Secondary | ICD-10-CM | POA: Diagnosis not present

## 2019-11-25 DIAGNOSIS — L97821 Non-pressure chronic ulcer of other part of left lower leg limited to breakdown of skin: Secondary | ICD-10-CM | POA: Diagnosis not present

## 2019-11-25 DIAGNOSIS — E7800 Pure hypercholesterolemia, unspecified: Secondary | ICD-10-CM | POA: Diagnosis not present

## 2019-11-25 DIAGNOSIS — I5032 Chronic diastolic (congestive) heart failure: Secondary | ICD-10-CM | POA: Insufficient documentation

## 2019-11-25 DIAGNOSIS — Z8673 Personal history of transient ischemic attack (TIA), and cerebral infarction without residual deficits: Secondary | ICD-10-CM | POA: Insufficient documentation

## 2019-11-25 DIAGNOSIS — Z9119 Patient's noncompliance with other medical treatment and regimen: Secondary | ICD-10-CM | POA: Diagnosis not present

## 2019-11-25 DIAGNOSIS — L97212 Non-pressure chronic ulcer of right calf with fat layer exposed: Secondary | ICD-10-CM | POA: Diagnosis not present

## 2019-11-25 DIAGNOSIS — Z8616 Personal history of COVID-19: Secondary | ICD-10-CM | POA: Insufficient documentation

## 2019-11-25 DIAGNOSIS — I890 Lymphedema, not elsewhere classified: Secondary | ICD-10-CM | POA: Insufficient documentation

## 2019-11-25 DIAGNOSIS — L97811 Non-pressure chronic ulcer of other part of right lower leg limited to breakdown of skin: Secondary | ICD-10-CM | POA: Insufficient documentation

## 2019-11-25 DIAGNOSIS — I87333 Chronic venous hypertension (idiopathic) with ulcer and inflammation of bilateral lower extremity: Secondary | ICD-10-CM | POA: Diagnosis not present

## 2019-11-25 DIAGNOSIS — E1129 Type 2 diabetes mellitus with other diabetic kidney complication: Secondary | ICD-10-CM | POA: Diagnosis not present

## 2019-11-25 DIAGNOSIS — I10 Essential (primary) hypertension: Secondary | ICD-10-CM | POA: Diagnosis not present

## 2019-11-25 DIAGNOSIS — G4733 Obstructive sleep apnea (adult) (pediatric): Secondary | ICD-10-CM | POA: Insufficient documentation

## 2019-11-25 DIAGNOSIS — I110 Hypertensive heart disease with heart failure: Secondary | ICD-10-CM | POA: Diagnosis not present

## 2019-11-25 DIAGNOSIS — L97222 Non-pressure chronic ulcer of left calf with fat layer exposed: Secondary | ICD-10-CM | POA: Diagnosis not present

## 2019-11-25 DIAGNOSIS — E1121 Type 2 diabetes mellitus with diabetic nephropathy: Secondary | ICD-10-CM | POA: Diagnosis not present

## 2019-11-27 DIAGNOSIS — E1121 Type 2 diabetes mellitus with diabetic nephropathy: Secondary | ICD-10-CM | POA: Diagnosis not present

## 2019-11-27 DIAGNOSIS — Z8673 Personal history of transient ischemic attack (TIA), and cerebral infarction without residual deficits: Secondary | ICD-10-CM | POA: Diagnosis not present

## 2019-11-27 DIAGNOSIS — E1129 Type 2 diabetes mellitus with other diabetic kidney complication: Secondary | ICD-10-CM | POA: Diagnosis not present

## 2019-11-27 DIAGNOSIS — Z6841 Body Mass Index (BMI) 40.0 and over, adult: Secondary | ICD-10-CM | POA: Diagnosis not present

## 2019-11-27 DIAGNOSIS — R296 Repeated falls: Secondary | ICD-10-CM | POA: Diagnosis not present

## 2019-11-27 DIAGNOSIS — G4733 Obstructive sleep apnea (adult) (pediatric): Secondary | ICD-10-CM | POA: Diagnosis not present

## 2019-11-27 DIAGNOSIS — E7800 Pure hypercholesterolemia, unspecified: Secondary | ICD-10-CM | POA: Diagnosis not present

## 2019-11-27 DIAGNOSIS — I10 Essential (primary) hypertension: Secondary | ICD-10-CM | POA: Diagnosis not present

## 2019-11-28 DIAGNOSIS — L97219 Non-pressure chronic ulcer of right calf with unspecified severity: Secondary | ICD-10-CM | POA: Diagnosis not present

## 2019-11-28 DIAGNOSIS — G4733 Obstructive sleep apnea (adult) (pediatric): Secondary | ICD-10-CM | POA: Diagnosis not present

## 2019-11-28 DIAGNOSIS — L97828 Non-pressure chronic ulcer of other part of left lower leg with other specified severity: Secondary | ICD-10-CM | POA: Diagnosis not present

## 2019-11-28 DIAGNOSIS — I872 Venous insufficiency (chronic) (peripheral): Secondary | ICD-10-CM | POA: Diagnosis not present

## 2019-11-28 DIAGNOSIS — I110 Hypertensive heart disease with heart failure: Secondary | ICD-10-CM | POA: Diagnosis not present

## 2019-11-28 DIAGNOSIS — I5032 Chronic diastolic (congestive) heart failure: Secondary | ICD-10-CM | POA: Diagnosis not present

## 2019-12-01 DIAGNOSIS — L97828 Non-pressure chronic ulcer of other part of left lower leg with other specified severity: Secondary | ICD-10-CM | POA: Diagnosis not present

## 2019-12-01 DIAGNOSIS — L97219 Non-pressure chronic ulcer of right calf with unspecified severity: Secondary | ICD-10-CM | POA: Diagnosis not present

## 2019-12-01 DIAGNOSIS — I110 Hypertensive heart disease with heart failure: Secondary | ICD-10-CM | POA: Diagnosis not present

## 2019-12-01 DIAGNOSIS — G4733 Obstructive sleep apnea (adult) (pediatric): Secondary | ICD-10-CM | POA: Diagnosis not present

## 2019-12-01 DIAGNOSIS — I5032 Chronic diastolic (congestive) heart failure: Secondary | ICD-10-CM | POA: Diagnosis not present

## 2019-12-01 DIAGNOSIS — I872 Venous insufficiency (chronic) (peripheral): Secondary | ICD-10-CM | POA: Diagnosis not present

## 2019-12-01 NOTE — Progress Notes (Signed)
[name] [name], [name] [name] ([id number]) Visit Report for 11/25/2019 Allergy List Details Patient Name: Date of Service: [name] [name], [name] [name] 11/25/2019 1:15 PM Medical Record [id number] Patient Account Number: [id number] Date of Birth/Sex: Treating RN: 1956-07-15 (64 y.o. [name] Primary Care Robena Ewy: [name] Other Clinician: Referring Paradise Vensel: Treating Danasia Baker/Extender:Robson, Donnamae Jude, August Albino in Treatment: 0 Allergies Active Allergies No Known Allergies Allergy Notes Electronic Signature(s) Signed: 12/01/2019 5:44:18 PM By: Levan Hurst RN, BSN Entered By: Levan Hurst on 11/25/2019 14:00:16 -------------------------------------------------------------------------------- Arrival Information Details Patient Name: Date of Service: [name] [name] 11/25/2019 1:15 PM Medical Record [id number] Patient Account Number: [id number] Date of Birth/Sex: Treating RN: Dec 26, 1955 (64 y.o. [name] Primary Care Jade Burright: [name] Other Clinician: Referring Vonnie Ligman: Treating Cynara Tatham/Extender:Robson, Donnamae Jude, August Albino in Treatment: 0 Visit Information Patient Arrived: Cane Arrival Time: 13:55 Accompanied By: alone Transfer Assistance: None Patient Identification Verified: Yes Secondary Verification Process Yes Completed: Patient Requires Transmission-Based No Precautions: Patient Has Alerts: Yes Patient Alerts: R ABI = 1.25 L ABI = 1.27 History Since Last Visit Added or deleted any medications: No Any new allergies or adverse reactions: No Had a fall or experienced change in activities of daily living that may affect risk of falls: No Signs or symptoms of abuse/neglect since last visito No Hospitalized since last visit: No Implantable device outside of the clinic excluding cellular tissue based products placed in the center since last visit: No Electronic Signature(s) Signed: 12/01/2019 5:44:18 PM By: Levan Hurst RN,  BSN Entered By: Levan Hurst on 11/25/2019 14:35:15 -------------------------------------------------------------------------------- Clinic Level of Care Assessment Details Patient Name: Date of Service: [name] [name], [name] [name] 11/25/2019 1:15 PM Medical Record [id number] Patient Account Number: [id number] Date of Birth/Sex: Treating RN: 12/08/1955 (63 y.o. [name]) [name] Primary Care Deysy Schabel: [name] Other Clinician: Referring Jettson Crable: Treating Kharisma Glasner/Extender:Robson, Donnamae Jude, August Albino in Treatment: 0 Clinic Level of Care Assessment Items TOOL 1 Quantity Score X - Use when EandM and Procedure is performed on INITIAL visit 1 0 ASSESSMENTS - Nursing Assessment / Reassessment X - General Physical Exam (combine w/ comprehensive assessment (listed just below) 1 20 when performed on new pt. evals) X - Comprehensive Assessment (HX, ROS, Risk Assessments, Wounds Hx, etc.) 1 25 ASSESSMENTS - Wound and Skin Assessment / Reassessment []  - Dermatologic / Skin Assessment (not related to wound area) 0 ASSESSMENTS - Ostomy and/or Continence Assessment and Care []  - Incontinence Assessment and Management 0 []  - Ostomy Care Assessment and Management (repouching, etc.) 0 PROCESS - Coordination of Care X - Simple Patient / Family Education for ongoing care 1 15 []  - Complex (extensive) Patient / Family Education for ongoing care 0 []  - Staff obtains Programmer, systems, Records, Test Results / Process Orders 0 []  - Staff telephones HHA, Nursing Homes / Clarify orders / etc 0 []  - Routine Transfer to another Facility (non-emergent condition) 0 []  - Routine Hospital Admission (non-emergent condition) 0 X - New Admissions / Biomedical engineer / Ordering NPWT, Apligraf, etc. 1 15 []  - Emergency Hospital Admission (emergent condition) 0 PROCESS - Special Needs []  - Pediatric / Minor Patient Management 0 []  - Isolation Patient Management 0 []  - Hearing / Language / Visual special needs  0 []  - Assessment of Community assistance (transportation, D/C planning, etc.) 0 []  - Additional assistance / Altered mentation 0 []  - Support Surface(s) Assessment (bed, cushion, seat, etc.) 0 INTERVENTIONS - Miscellaneous []  - External ear exam 0 []  - Patient Transfer (multiple staff / Civil Service fast streamer / Similar devices) 0 []  -  Simple Staple / Suture removal (25 or less) 0 []  - Complex Staple / Suture removal (26 or more) 0 []  - Hypo/Hyperglycemic Management (do not check if billed separately) 0 X - Ankle / Brachial Index (ABI) - do not check if billed separately 1 15 Has the patient been seen at the hospital within the last three years: Yes Total Score: 90 Level Of Care: New/Established - Level 3 Electronic Signature(s) Signed: 11/26/2019 7:20:02 AM By: [name] RN Entered By: [name] on 11/25/2019 14:52:29 -------------------------------------------------------------------------------- Compression Therapy Details Patient Name: Date of Service: [name] [name] 11/25/2019 1:15 PM Medical Record [id number] Patient Account Number: [id number] Date of Birth/Sex: Treating RN: 1956/03/28 (63 y.o. [name]) [name] Primary Care Emmett Arntz: [name] Other Clinician: Referring Kasim Mccorkle: Treating Liborio Saccente/Extender:Robson, Donnamae Jude, August Albino in Treatment: 0 Compression Therapy Performed for Wound Wound #65 Left,Circumferential Lower Leg Assessment: Performed By: Clinician [name], RN Compression Type: Four Layer Post Procedure Diagnosis Same as Pre-procedure Electronic Signature(s) Signed: 11/26/2019 7:20:02 AM By: [name] RN Entered By: [name] on 11/25/2019 14:52:58 -------------------------------------------------------------------------------- Compression Therapy Details Patient Name: Date of Service: [name] [name], [name] [name] 11/25/2019 1:15 PM Medical Record [id number] Patient Account Number: [id number] Date of Birth/Sex: Treating RN: 11-08-1955 (63  y.o. [name] [name] Primary Care Timira Bieda: [name] Other Clinician: Referring Zahir Eisenhour: Treating [place]/Extender:Robson, Donnamae Jude, August Albino in Treatment: 0 Compression Therapy Performed for Wound Wound #66 Right,Circumferential Lower Leg Assessment: Performed By: Clinician [name], RN Compression Type: Four Layer Post Procedure Diagnosis Same as Pre-procedure Electronic Signature(s) Signed: 11/26/2019 7:20:02 AM By: [name] RN Entered By: [name] on 11/25/2019 14:52:58 -------------------------------------------------------------------------------- Encounter Discharge Information Details Patient Name: Date of Service: [name] [name]. 11/25/2019 1:15 PM Medical Record [id number] Patient Account Number: [id number] Date of Birth/Sex: Treating RN: 26-Oct-1955 (64 y.o. [name] Primary Care Marc Leichter: [name] Other Clinician: Referring Keishawna Carranza: Treating Marne Meline/Extender:Robson, Donnamae Jude, August Albino in Treatment: 0 Encounter Discharge Information Items Discharge Condition: Stable Ambulatory Status: Cane Discharge Destination: Home Transportation: Private Auto Schedule Follow-up Appointment: Yes Clinical Summary of Care: Patient Declined Electronic Signature(s) Signed: 12/01/2019 5:44:18 PM By: Levan Hurst RN, BSN Entered By: Levan Hurst on 11/25/2019 16:48:41 -------------------------------------------------------------------------------- Lower Extremity Assessment Details Patient Name: Date of Service: [name] [name] 11/25/2019 1:15 PM Medical Record [id number] Patient Account Number: [id number] Date of Birth/Sex: Treating RN: 03/31/1956 (64 y.o. [name] Primary Care Symphonie Schneiderman: [name] Other Clinician: Referring Garnie Borchardt: Treating Sohan Potvin/Extender:Robson, Donnamae Jude, August Albino in Treatment: 0 Edema Assessment Assessed: [Left: No] [Right: No] Edema: [Left: Yes] [Right: Yes] Calf Left:  Right: Point of Measurement: 30 cm From Medial Instep 44 cm 42.2 cm Ankle Left: Right: Point of Measurement: 13 cm From Medial Instep 26.8 cm 27.5 cm Vascular Assessment Pulses: Dorsalis Pedis Palpable: [Left:Yes] [Right:Yes] Electronic Signature(s) Signed: 12/01/2019 5:44:18 PM By: Levan Hurst RN, BSN Entered By: Levan Hurst on 11/25/2019 14:27:02 -------------------------------------------------------------------------------- [place] Details Patient Name: Date of Service: [name] [name]. 11/25/2019 1:15 PM Medical Record [id number] Patient Account Number: [id number] Date of Birth/Sex: Treating RN: Oct 14, 1955 (63 y.o. [name] [name] Primary Care Znya Albino: [name] Other Clinician: Referring Derrian Poli: Treating Ladarrell Cornwall/Extender:Robson, Donnamae Jude, August Albino in Treatment: 0 Active Inactive Wound/Skin Impairment Nursing Diagnoses: Knowledge deficit related to ulceration/compromised skin integrity Goals: Patient/caregiver will verbalize understanding of skin care regimen Date Initiated: 11/25/2019 Target Resolution Date: 12/26/2019 Goal Status: Active Ulcer/skin breakdown will have a volume reduction of 30% by week 4 Date Initiated: 11/25/2019 Target Resolution Date: 12/26/2019 Goal  Status: Active Interventions: Assess patient/caregiver ability to obtain necessary supplies Assess patient/caregiver ability to perform ulcer/skin care regimen upon admission and as needed Assess ulceration(s) every visit Notes: Electronic Signature(s) Signed: 11/26/2019 7:20:02 AM By: [name] RN Entered By: [name] on 11/25/2019 14:45:23 -------------------------------------------------------------------------------- Pain Assessment Details Patient Name: Date of Service: [name] [name], [name] [name] 11/25/2019 1:15 PM Medical Record [id number] Patient Account Number: [id number] Date of Birth/Sex: Treating RN: 21-Mar-1956 (64 y.o. [name] Primary Care Rowan Pollman: [name] Other Clinician: Referring Eliott Amparan: Treating Kiley Solimine/Extender:Robson, Donnamae Jude, August Albino in Treatment: 0 Active Problems Location of Pain Severity and Description of Pain Patient Has Paino No Site Locations Pain Management and Medication Current Pain Management: Electronic Signature(s) Signed: 12/01/2019 5:44:18 PM By: Levan Hurst RN, BSN Entered By: Levan Hurst on 11/25/2019 14:29:47 -------------------------------------------------------------------------------- Patient/Caregiver Education Details Patient Name: Date of Service: [name] [name], [name] W. 3/9/2021andnbsp1:15 PM Medical Record [id number] Patient Account Number: [id number] Date of Birth/Gender: Treating RN: 1955/09/23 (63 y.o. [name] [name], [name] [name] Primary Care Physician: [name] Other Clinician: Referring Physician: Treating Physician/Extender:Robson, Donnamae Jude, August Albino in Treatment: 0 Education Assessment Education Provided To: Patient Education Topics Provided Wound/Skin Impairment: Methods: Explain/Verbal Responses: State content correctly Electronic Signature(s) Signed: 11/26/2019 7:20:02 AM By: [name] RN Entered By: [name] on 11/25/2019 14:45:42 -------------------------------------------------------------------------------- Wound Assessment Details Patient Name: Date of Service: [name] [name], [name] [name] 11/25/2019 1:15 PM Medical Record [id number] Patient Account Number: [id number] Date of Birth/Sex: Treating RN: Jan 18, 1956 (63 y.o. [name]) [name] Primary Care Kenai Fluegel: [name] Other Clinician: Referring Metztli Sachdev: Treating Chayse Gracey/Extender:Robson, Donnamae Jude, August Albino in Treatment: 0 Wound Status Wound Number: 65 Primary Venous Leg Ulcer Etiology: Wound Location: Left Lower Leg - Circumferential Secondary Lymphedema Wounding Event: Gradually Appeared Etiology: Date Acquired: 09/08/2019 Wound Open Weeks  Of Treatment: 0 Status: Clustered Wound: No Clustered Wound: No Comorbid Lymphedema, Sleep Apnea, Congestive History: Heart Failure, Hypertension, Peripheral Venous Disease, Osteoarthritis Photos Wound Measurements Length: (cm) 18 % Reduct Width: (cm) 43.5 % Reduct Depth: (cm) 0.1 Epitheli Area: (cm) 614.967 Tunneli Volume: (cm) 61.497 Undermi Wound Description Full Thickness Without Exposed Support Foul Odo Classification: Structures Slough/F Wound Flat and Intact Margin: Exudate Large Amount: Exudate Serosanguineous Type: Exudate red, brown Color: Wound Bed Granulation Amount: Large (67-100%) Granulation Quality: Red Fascia E Necrotic Amount: None Present (0%) Fat Laye Tendon E Muscle E Joint Ex Bone Exp r After Cleansing: No ibrino No Exposed Structure xposed: No r (Subcutaneous Tissue) Exposed: Yes xposed: No xposed: No posed: No osed: No ion in Area: 0% ion in Volume: 0% alization: None ng: No ning: No Treatment Notes Wound #65 (Left, Circumferential Lower Leg) 1. Cleanse With Soap and water 2. Periwound Care TCA Ointment 3. Primary Dressing Applied Calcium Alginate Ag 4. Secondary Dressing ABD Pad 6. Support Layer Applied 4 layer compression wrap Electronic Signature(s) Signed: 11/26/2019 2:27:25 PM By: Mikeal Hawthorne EMT/HBOT Signed: 11/26/2019 5:35:23 PM By: [name] RN Entered By: Mikeal Hawthorne on 11/26/2019 13:23:02 -------------------------------------------------------------------------------- Wound Assessment Details Patient Name: Date of Service: [name] [name], [name] [name] 11/25/2019 1:15 PM Medical Record [id number] Patient Account Number: [id number] Date of Birth/Sex: Treating RN: February 19, 1956 (63 y.o. [name] [name] Primary Care Jabril Pursell: [name] Other Clinician: Referring Adriano Bischof: Treating Tanner Yeley/Extender:Robson, Donnamae Jude, August Albino in Treatment: 0 Wound Status Wound Number: 66 Primary Venous Leg  Ulcer Etiology: Wound Location: Right Lower Leg - Circumferential Secondary Lymphedema Wounding Event: Gradually Appeared Etiology: Date Acquired: 09/08/2019 Wound Open Weeks Of Treatment: 0 Status: Clustered Wound: No Comorbid Lymphedema, Sleep Apnea, Congestive  History: Heart Failure, Hypertension, Peripheral Venous Disease, Osteoarthritis Photos Wound Measurements Length: (cm) 17 % Reduct Width: (cm) 37 % Reduct Depth: (cm) 0.1 Epitheli Area: (cm) 494.015 Tunneli Volume: (cm) 49.402 Undermi Wound Description Classification: Full Thickness Without Exposed Support Foul Od Structures Slough/ Wound Flat and Intact Margin: Exudate Medium Amount: Exudate Serosanguineous Type: Exudate red, brown Color: Wound Bed Granulation Amount: Large (67-100%) Granulation Quality: Red Fascia Expos Necrotic Amount: None Present (0%) Fat Layer (S Tendon Expos Muscle Expos Joint Expose Bone Exposed or After Cleansing: No Fibrino No Exposed Structure ed: No ubcutaneous Tissue) Exposed: Yes ed: No ed: No d: No : No ion in Area: 0% ion in Volume: 0% alization: None ng: No ning: No Treatment Notes Wound #66 (Right, Circumferential Lower Leg) 1. Cleanse With Soap and water 2. Periwound Care TCA Ointment 3. Primary Dressing Applied Calcium Alginate Ag 4. Secondary Dressing ABD Pad 6. Support Layer Applied 4 layer compression wrap Electronic Signature(s) Signed: 11/26/2019 2:27:25 PM By: Mikeal Hawthorne EMT/HBOT Signed: 11/26/2019 5:35:23 PM By: [name] RN Entered By: Mikeal Hawthorne on 11/26/2019 13:22:40 -------------------------------------------------------------------------------- Vitals Details Patient Name: Date of Service: [name] [name]. 11/25/2019 1:15 PM Medical Record [id number] Patient Account Number: [id number] Date of Birth/Sex: Treating RN: January 07, 1956 (64 y.o. [name] Primary Care Coraima Tibbs: [name] Other Clinician: Referring  Janazia Schreier: Treating Oluwatobiloba Martin/Extender:Robson, Donnamae Jude, August Albino in Treatment: 0 Vital Signs Time Taken: 13:58 Temperature (F): 97.6 Height (in): 70 Pulse (bpm): 65 Source: Stated Respiratory Rate (breaths/min): 20 Weight (lbs): 360 Blood Pressure (mmHg): 159/90 Source: Stated Reference Range: 80 - 120 mg / dl Body Mass Index (BMI): 51.6 Electronic Signature(s) Signed: 12/01/2019 5:44:18 PM By: Levan Hurst RN, BSN Entered By: Levan Hurst on 11/25/2019 13:59:59

## 2019-12-01 NOTE — Progress Notes (Addendum)
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 left. He has been using silver alginate and and 4-layer compression. He still has [facility] home health care coming out. He tells me he went to [facility] wound care twice. They healed him out after that he does not think he was actually healed. His wife at the time was in [place] after falling and fracturing her femur by the sound of it. She is currently in a nursing home  in [place] He comes into clinic today with large areas of superficial denuded epithelium which is almost circumferential on the right and a large area on the left lateral. His edema control is marginal. He is not in any pain. Past medical history includes congestive heart failure, previous venous ablation, lymphedema and obstructive sleep apnea. He has compression pumps at home but he has been completely noncompliant with this by his own admission. He is not a diabetic. ABIs in our clinic were 1.27 on the right and 1.25 on the left we do not have time for this this afternoon I am and I am not comfortable 10/9; the patient arrives with green drainage under the compression right greater than left. He is not complaining of any pain. He also almost circumferential epithelial loss on the right. He has home health changing the dressing. We are doing 2-week follow-ups. He lives in [place] 10/23; 2-week follow-up. The patient took his antibiotics he seems to have tolerated this well. He has still areas on the right and left calf left more substantially. I think he has some improvement in the epithelialization. He has new wounds on the left dorsal foot today. He says he has been using compression pumps 11/6; two-week follow-up. The patient arrived still with wounds mostly on his lateral lower legs. He has a new area on the right dorsal foot today just in close proximity to his toes. His edema control is marginal. He will not use his compression pumps. He has home health changing the dressings. He tells [place] that his wife is in the hospital in [place] with heart failure. I suspect he is sitting at her bedside for most the day. His legs are probably dependent. 12/4; 1 month follow-up. He arrives with  everything on his legs completely closed. He has some form of external compression garment at home as well as compression pumps. READMISSION 11/25/2019 We discharge this patient on 08/22/2019 with everything on  his bilateral lower legs closed. He had an external compression stocking for both legs at home as well as compression pumps although admittedly he has never been compliant with the latter. He states his legs stay closed for about 2 or 3 weeks. He ended up in hospital at [place] from 12/22 through 12/29 with Covid infection. He came home and is gradually been regaining his strength. Currently has bilateral innumerable wounds almost circumferentially on both lower legs in the setting of severe venous inflammation but no current infection. The patient has diastolic heart failure hypertension history of TIA chronic venous ulcers had obstructive sleep apnea although he is not compliant with CPAP. He was never felt to have an arterial issue in this clinic his pedal pulses and ABIs have been normal most recently in September/20 at 1.25 on the right and 1.27 on the left Patient History Information obtained from Patient. Allergies No Known Allergies Family History Cancer - Mother, Diabetes - Paternal Grandparents, Heart Disease - Siblings, Hypertension - Mother,Father,Siblings, Stroke - Paternal Grandparents. Social History Never smoker, Marital Status - Married, Alcohol Use - Never, Drug Use - No History, Caffeine Use - Daily - SODA. Medical History Eyes Denies history of Cataracts, Glaucoma, Optic Neuritis Ear/Nose/Mouth/Throat Denies history of Chronic sinus problems/congestion, Middle ear problems Hematologic/Lymphatic Patient has history of Lymphedema - bilateral lower legs Denies history of Anemia, Hemophilia, Human Immunodeficiency Virus Respiratory Patient has history of Sleep Apnea - obstructive Denies history of Aspiration, Asthma, Chronic Obstructive Pulmonary Disease (COPD), Tuberculosis Cardiovascular Patient has history of Congestive Heart Failure - diastolic, Hypertension, Peripheral Venous Disease Denies history of Angina, Arrhythmia, Coronary Artery Disease, Deep Vein  Thrombosis, Hypotension, Myocardial Infarction, Peripheral Arterial Disease, Phlebitis, Vasculitis Gastrointestinal Denies history of Cirrhosis , Colitis, Crohnoos, Hepatitis A, Hepatitis B, Hepatitis C Endocrine Denies history of Type I Diabetes, Type II Diabetes Genitourinary Denies history of End Stage Renal Disease Immunological Denies history of Lupus Erythematosus, Raynaudoos, Scleroderma Integumentary (Skin) Denies history of History of Burn Musculoskeletal Patient has history of Osteoarthritis Denies history of Gout, Rheumatoid Arthritis, Osteomyelitis Neurologic Denies history of Dementia, Neuropathy, Quadriplegia, Paraplegia Oncologic Denies history of Received Chemotherapy, Received Radiation Psychiatric Denies history of Anorexia/bulimia Hospitalization/Surgery History - kidney stones. - stroke. - stroke/TIA. Medical And Surgical History Notes Constitutional Symptoms (General Health) h/o open leg wound , h/o CVA , TIA , varicose veins Respiratory asbestosis Genitourinary nephrolithiasis Review of Systems (ROS) Constitutional Symptoms (General Health) Denies complaints or symptoms of Fatigue, Fever, Chills, Marked Weight Change. Eyes Complains or has symptoms of Glasses / Contacts - glasses. Ear/Nose/Mouth/Throat Denies complaints or symptoms of Chronic sinus problems or rhinitis. Gastrointestinal Denies complaints or symptoms of Frequent diarrhea, Nausea, Vomiting. Endocrine Denies complaints or symptoms of Heat/cold intolerance. Genitourinary Denies complaints or symptoms of Frequent urination. Integumentary (Skin) Complains or has symptoms of Wounds - bilateral lower leg wounds. Musculoskeletal Denies complaints or symptoms of Muscle Pain, Muscle Weakness. Neurologic Denies complaints or symptoms of Numbness/parasthesias. Psychiatric Denies complaints or symptoms of Claustrophobia, Suicidal. Objective Constitutional Patient is hypertensive.. Pulse  regular and within target range for patient.Marland Kitchen Respirations regular, non-labored and within target range.. Temperature is normal and within the target range for the patient.Marland Kitchen Appears in no distress. Vitals Time Taken: 1:58 PM, Height: 70 in, Source: Stated, Weight: 360 lbs, Source: Stated, BMI: 51.6,  Temperature: 97.6 F, Pulse: 65 bpm, Respiratory Rate: 20 breaths/min, Blood Pressure: 159/90 mmHg. Cardiovascular Pedal pulses palpable and strong bilaterally.Marland Kitchen Poorly controlled edema. Severe bilateral stasis dermatitis.. General Notes: Wound exam; there are numerable small wounds left greater than right circumferentially. These are all limited to skin depth however we are in danger of getting something more dramatic here. There is no palpable tenderness of the soft tissues. I do not believe that there is any actual infection here. Integumentary (Hair, Skin) Wound #65 status is Open. Original cause of wound was Gradually Appeared. The wound is located on the Left,Circumferential Lower Leg. The wound measures 18cm length x 43.5cm width x 0.1cm depth; 614.967cm^2 area and 61.497cm^3 volume. There is Fat Layer (Subcutaneous Tissue) Exposed exposed. There is no tunneling or undermining noted. There is a large amount of serosanguineous drainage noted. The wound margin is flat and intact. There is large (67-100%) red granulation within the wound bed. There is no necrotic tissue within the wound bed. Wound #66 status is Open. Original cause of wound was Gradually Appeared. The wound is located on the Right,Circumferential Lower Leg. The wound measures 17cm length x 37cm width x 0.1cm depth; 494.015cm^2 area and 49.402cm^3 volume. There is Fat Layer (Subcutaneous Tissue) Exposed exposed. There is no tunneling or undermining noted. There is a medium amount of serosanguineous drainage noted. The wound margin is flat and intact. There is large (67-100%) red granulation within the wound bed. There is no  necrotic tissue within the wound bed. 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 There was a Four Layer Compression Therapy Procedure by [name], RN. Post procedure Diagnosis Wound #65: Same as Pre-Procedure Wound #66 Pre-procedure diagnosis of Wound #66 is a Venous Leg Ulcer located on the Right,Circumferential Lower Leg . There was a Four Layer Compression Therapy Procedure by [name], RN. Post procedure Diagnosis Wound #66: Same as Pre-Procedure Plan Follow-up Appointments: Return Appointment in 2 weeks. Dressing Change Frequency: Other: - 2 times per week Skin Barriers/Peri-Wound Care: TCA Cream or Ointment - lightly from knee to ankle on left and right lower legs Wound Cleansing: Wound #65 Left,Circumferential Lower Leg: Clean wound with Normal Saline. Wound #66 Right,Circumferential Lower Leg: Clean wound with Normal Saline. Primary Wound Dressing: Wound #65 Left,Circumferential Lower Leg: Calcium Alginate with Silver Wound #66 Right,Circumferential Lower Leg: Calcium Alginate with Silver Secondary Dressing: Wound #65 Left,Circumferential Lower Leg: ABD pad Wound #66 Right,Circumferential Lower Leg: ABD pad Edema Control: 4 layer compression - Bilateral Elevate legs to the level of the heart or above for 30 minutes daily and/or when sitting, a frequency of: Segmental Compressive Device. - 1 hour per day Home Health: Wound #65 Left,Circumferential Lower Leg: Continue Home Health skilled nursing for wound care. - [facility]edysis Wound #66  Right,Circumferential Lower Leg: [facility] skilled nursing for wound care. - amedysis The following medication(s) was prescribed: triamcinolone acetonide topical 0.1 % cream cream topical lightly to affected areas on bilateral lower extremities with dressing changes for severe stasis dermatitis starting 11/25/2019 1. Triamcinolone lightly to the bilateral lower extremities to see if we can control some of the inflammation 2. Silver  alginate to the open areas right with rolled ABDs 3. 4-layer compression. 4. I pleaded with him to use his compression pumps at least once a day for 1 hour. He said he would although this is been a problem historically. 5. I see no other other evidence of any signs of systemic fluid volume overload cardio and pulmonary exams were normal I spent 30 minutes in the review of this patient's record, face-to-face evaluation and preparation of this record Electronic Signature(s) Signed: 12/04/2019 7:56:30 AM By: Linton Ham MD Previous Signature: 11/25/2019 5:48:31 PM Version By: Linton Ham MD Entered By: Linton Ham on 12/04/2019 07:56:30 -------------------------------------------------------------------------------- HxROS Details Patient Name: Date of Service: [name] [name]. 11/25/2019 1:15 PM Medical Record [id number] Patient Account Number: [id number] Date of Birth/Sex: Treating RN: September 29, 1955 (64 y.o. [name] Primary Care Provider: [name] Other Clinician: Referring Provider: Treating Provider/Extender:Damani Kelemen, Donnamae Jude, August Albino in Treatment: 0 Information Obtained From Patient Constitutional Symptoms (General Health) Complaints and Symptoms: Negative for: Fatigue; Fever; Chills; Marked Weight Change Medical History: Past Medical History Notes: h/o open leg wound , h/o CVA , TIA , varicose veins Eyes Complaints and Symptoms: Positive for: Glasses / Contacts - glasses Medical History: Negative for:  Cataracts; Glaucoma; Optic Neuritis Ear/Nose/Mouth/Throat Complaints and Symptoms: Negative for: Chronic sinus problems or rhinitis Medical History: Negative for: Chronic sinus problems/congestion; Middle ear problems Gastrointestinal Complaints and Symptoms: Negative for: Frequent diarrhea; Nausea; Vomiting Medical History: Negative for: Cirrhosis ; Colitis; Crohns; Hepatitis A; Hepatitis B; Hepatitis C Endocrine Complaints and Symptoms: Negative for: Heat/cold intolerance Medical History: Negative for: Type I Diabetes; Type II Diabetes Genitourinary Complaints and Symptoms: Negative for: Frequent urination Medical History: Negative for: End Stage Renal Disease Past Medical History Notes: nephrolithiasis Integumentary (Skin) Complaints and Symptoms: Positive for: Wounds - bilateral lower leg wounds Medical History: Negative for: History of Burn Musculoskeletal Complaints and Symptoms: Negative for: Muscle Pain; Muscle Weakness Medical History: Positive for: Osteoarthritis Negative for: Gout; Rheumatoid Arthritis; Osteomyelitis Neurologic Complaints and Symptoms: Negative for: Numbness/parasthesias Medical History: Negative for: Dementia; Neuropathy; Quadriplegia; Paraplegia Psychiatric Complaints and Symptoms: Negative for: Claustrophobia; Suicidal Medical History: Negative for: Anorexia/bulimia Hematologic/Lymphatic Medical History: Positive for: Lymphedema - bilateral lower legs Negative for: Anemia; Hemophilia; Human Immunodeficiency Virus Respiratory Medical History: Positive for: Sleep Apnea - obstructive Negative for: Aspiration; Asthma; Chronic Obstructive Pulmonary Disease (COPD); Tuberculosis Past Medical History Notes: asbestosis Cardiovascular Medical History: Positive for: Congestive Heart Failure - diastolic; Hypertension; Peripheral Venous Disease Negative for: Angina; Arrhythmia; Coronary Artery Disease; Deep Vein Thrombosis; Hypotension;  Myocardial Infarction; Peripheral Arterial Disease; Phlebitis; Vasculitis Immunological Medical History: Negative for: Lupus Erythematosus; Raynauds; Scleroderma Oncologic Medical History: Negative for: Received Chemotherapy; Received Radiation Immunizations Pneumococcal Vaccine: Received Pneumococcal Vaccination: No Implantable Devices No devices added Hospitalization / Surgery History Type of Hospitalization/Surgery kidney stones stroke stroke/TIA Family and Social History Cancer: Yes - Mother; Diabetes: Yes - Paternal Grandparents; Heart Disease: Yes - Siblings; Hypertension: Yes - Mother,Father,Siblings; Stroke: Yes - Paternal Grandparents; Never smoker; Marital Status - Married; Alcohol Use: Never; Drug Use: No History; Caffeine Use: Daily - SODA; Financial Concerns: No; Food, Clothing or Shelter Needs: No; Support System Lacking: No; Transportation Concerns: No Engineer, maintenance) Signed: 11/25/2019 5:48:31 PM By: Linton Ham MD Signed: 12/01/2019 5:44:18 PM By: Levan Hurst RN, BSN Entered By: Levan Hurst on 11/25/2019 14:35:41 -------------------------------------------------------------------------------- [place] Details Patient Name: Date of Service: [name] [name] 11/25/2019 Medical Record [id number] Patient Account Number: [id number] Date of Birth/Sex: Treating RN: Oct 25, 1955 (63 y.o. [name]) [place], [place] Primary  Care Provider: [name] Other Clinician: Referring Provider: Treating Provider/Extender:Piper Albro, Donnamae Jude, August Albino in Treatment: 0 Diagnosis Coding ICD-10 Codes Code Description [id number] Chronic venous hypertension (idiopathic) with ulcer and inflammation of bilateral lower extremity I89.0 Lymphedema, not elsewhere classified L97.821 Non-pressure chronic ulcer of other part of left lower leg limited to breakdown of skin L97.811 Non-pressure chronic ulcer of other part of right lower leg limited to breakdown of skin Facility  Procedures CPT4: Description Modifier Quantity Code [id number] - WOUND CARE VISIT-LEV 3 EST PT 25 1 CPT4: A999333 BILATERAL: Application of multi-layer venous compression system; leg 25 1 (below knee), including ankle and foot. Physician Procedures CPT4: Description Modifier Quantity Code [id number] - WC PHYS LEVEL 4 - EST PT 1 ICD-10 Diagnosis Description L97.821 Non-pressure chronic ulcer of other part of left lower leg limited to breakdown of skin L97.811 Non-pressure chronic ulcer of other  part of right lower leg limited to breakdown of skin I87.333 Chronic venous hypertension (idiopathic) with ulcer and inflammation of bilateral lower extremity I89.0 Lymphedema, not elsewhere classified Electronic Signature(s) Signed: 11/25/2019 5:48:31 PM By: Linton Ham MD Signed: 11/26/2019 7:20:02 AM By: [name] RN Entered By: [name] on 11/25/2019 15:58:58

## 2019-12-01 NOTE — Progress Notes (Signed)
[name], [name] ([id number]) Visit Report for 11/25/2019 Abuse/Suicide Risk Screen Details Patient Name: Date of Service: [name], [name] 11/25/2019 1:15 PM Medical Record [id number] Patient Account Number: [id number] Date of Birth/Sex: Treating RN: 1956-02-10 (64 y.o. [name] Primary Care Chue Berkovich: [name] Other Clinician: Referring Hildegard Hlavac: Treating Angy Swearengin/Extender:Robson, Donnamae Jude, August Albino in Treatment: 0 Abuse/Suicide Risk Screen Items Answer ABUSE RISK SCREEN: Has anyone close to you tried to hurt or harm you recentlyo No Do you feel uncomfortable with anyone in your familyo No Has anyone forced you do things that you didnt want to doo No Electronic Signature(s) Signed: 12/01/2019 5:44:18 PM By: Levan Hurst RN, BSN Entered By: Levan Hurst on 11/25/2019 14:24:20 -------------------------------------------------------------------------------- Activities of Daily Living Details Patient Name: Date of Service: [name], [name] 11/25/2019 1:15 PM Medical Record [id number] Patient Account Number: [id number] Date of Birth/Sex: Treating RN: 1956/01/16 (64 y.o. [name] Primary Care Kimberleigh Mehan: [name] Other Clinician: Referring Kalyani Maeda: Treating Mulan Adan/Extender:Robson, Donnamae Jude, August Albino in Treatment: 0 Activities of Daily Living Items Answer Activities of Daily Living (Please select one for each item) Drive Automobile Completely Able Take Medications Completely Able Use Telephone Completely Able Care for Appearance Completely Able Use Toilet Completely Able Bath / Shower Completely Able Dress Self Completely Able Feed Self Completely Able Walk Completely Able Get In / Out Bed Completely Able Housework Need Assistance Prepare Meals Need Assistance Handle Money Completely Able Shop for Self Need Assistance Electronic Signature(s) Signed: 12/01/2019 5:44:18 PM By: Levan Hurst RN, BSN Entered By: Levan Hurst on 11/25/2019 14:24:47 -------------------------------------------------------------------------------- Education Screening Details Patient Name: Date of Service: [name] [name] 11/25/2019 1:15 PM Medical Record [id number] Patient Account Number: [id number] Date of Birth/Sex: Treating RN: 1955/12/09 (64 y.o. [name] Primary Care Yashas Camilli: [name] Other Clinician: Referring Chele Cornell: Treating Ronzell Laban/Extender:Robson, Donnamae Jude, August Albino in Treatment: 0 Primary Learner Assessed: Patient Learning Preferences/Education Level/Primary Language Learning Preference: Explanation, Demonstration, Printed Material Highest Education Level: College or Above Preferred Language: English Cognitive Barrier Language Barrier: No Translator Needed: No Memory Deficit: No Emotional Barrier: No Cultural/Religious Beliefs Affecting Medical Care: No Physical Barrier Impaired Vision: No Impaired Hearing: No Decreased Hand dexterity: No Knowledge/Comprehension Knowledge Level: High Comprehension Level: High Ability to understand written High instructions: Ability to understand verbal High instructions: Motivation Anxiety Level: Calm Cooperation: Cooperative Education Importance: Acknowledges Need Interest in Health Problems: Asks Questions Perception: Coherent Willingness to Engage in Self- High Management Activities: Readiness to Engage in Self- High Management Activities: Electronic Signature(s) Signed: 12/01/2019 5:44:18 PM By: Levan Hurst RN, BSN Entered By: Levan Hurst on 11/25/2019 14:25:12 -------------------------------------------------------------------------------- Fall Risk Assessment Details Patient Name: Date of Service: [name] [name]. 11/25/2019 1:15 PM Medical Record [id number] Patient Account Number: [id number] Date of Birth/Sex: Treating RN: [birth year] (64 y.o. [name] Primary Care Tobi Leinweber: [name] Other Clinician: Referring Devika Dragovich: Treating Teriyah Purington/Extender:Robson, Donnamae Jude, August Albino in Treatment: 0 Fall Risk Assessment Items Have you had 2 or more falls in the last 12 monthso 0 Yes Have you had any fall that resulted in injury in the last 12 monthso 0 No FALLS RISK SCREEN History of falling - immediate or within 3 months 0 No Secondary diagnosis (Do you have 2 or more medical diagnoseso) 15 Yes Ambulatory aid None/bed rest/wheelchair/nurse 0 No Crutches/cane/walker 15 Yes Furniture 0 No Intravenous therapy Access/Saline/Heparin Lock 0 No Weak (short steps with or without shuffle, stooped but able to lift head 0 No while walking, may seek support from furniture) Impaired (  short steps with shuffle, may have difficulty arising from chair, 0 No head down, impaired balance) Mental Status Oriented to own ability 0 Yes Overestimates or forgets limitations 0 No Risk Level: Medium Risk Score: 30 Electronic Signature(s) Signed: 12/01/2019 5:44:18 PM By: Levan Hurst RN, BSN Entered By: Levan Hurst on 11/25/2019 14:25:40 -------------------------------------------------------------------------------- Foot Assessment Details Patient Name: Date of Service: [name] [name] 11/25/2019 1:15 PM Medical Record [id number] Patient Account Number: [id number] Date of Birth/Sex: Treating RN: 1956-07-22 (64 y.o. [name] Primary Care Zipporah Finamore: [name] Other Clinician: Referring Howie Rufus: Treating Bergen Melle/Extender:Robson, Donnamae Jude, August Albino in Treatment: 0 Foot Assessment Items Site Locations + = Sensation present, - = Sensation absent, C = Callus, U = Ulcer R = Redness, W = Warmth, M = Maceration, PU = Pre-ulcerative lesion F = Fissure, S = Swelling, D = Dryness Assessment Right: Left: Other Deformity: No No Prior Foot Ulcer: No No Prior Amputation: No No Charcot Joint: No No Ambulatory Status: Ambulatory With Help Assistance Device:  Cane Gait: Steady Electronic Signature(s) Signed: 12/01/2019 5:44:18 PM By: Levan Hurst RN, BSN Entered By: Levan Hurst on 11/25/2019 14:26:13 -------------------------------------------------------------------------------- Nutrition Risk Screening Details Patient Name: Date of Service: [name], [name] 11/25/2019 1:15 PM Medical Record [id number] Patient Account Number: [id number] Date of Birth/Sex: Treating RN: September 08, 1956 (64 y.o. [name] Primary Care Harace Mccluney: [name] Other Clinician: Referring Shawndell Schillaci: Treating Gianlucca Szymborski/Extender:Robson, Donnamae Jude, August Albino in Treatment: 0 Height (in): 70 Weight (lbs): 360 Body Mass Index (BMI): 51.6 Nutrition Risk Screening Items Score Screening NUTRITION RISK SCREEN: I have an illness or condition that made me change the kind and/or 2 Yes amount of food I eat I eat fewer than two meals per day 0 No I eat few fruits and vegetables, or milk products 0 No I have three or more drinks of beer, liquor or wine almost every day 0 No I have tooth or mouth problems that make it hard for me to eat 0 No I don't always have enough money to buy the food I need 0 No I eat alone most of the time 0 No I take three or more different prescribed or over-the-counter drugs a day 1 Yes 0 No Without wanting to, I have lost or gained 10 pounds in the last six months I am not always physically able to shop, cook and/or feed myself 2 Yes Nutrition Protocols Good Risk Protocol Provide education on Moderate Risk Protocol 0 nutrition High Risk Proctocol Risk Level: Moderate Risk Score: 5 Electronic Signature(s) Signed: 12/01/2019 5:44:18 PM By: Levan Hurst RN, BSN Entered By: Levan Hurst on 11/25/2019 14:25:50

## 2019-12-02 DIAGNOSIS — L97219 Non-pressure chronic ulcer of right calf with unspecified severity: Secondary | ICD-10-CM | POA: Diagnosis not present

## 2019-12-02 DIAGNOSIS — L97828 Non-pressure chronic ulcer of other part of left lower leg with other specified severity: Secondary | ICD-10-CM | POA: Diagnosis not present

## 2019-12-02 DIAGNOSIS — I5032 Chronic diastolic (congestive) heart failure: Secondary | ICD-10-CM | POA: Diagnosis not present

## 2019-12-02 DIAGNOSIS — G4733 Obstructive sleep apnea (adult) (pediatric): Secondary | ICD-10-CM | POA: Diagnosis not present

## 2019-12-02 DIAGNOSIS — I872 Venous insufficiency (chronic) (peripheral): Secondary | ICD-10-CM | POA: Diagnosis not present

## 2019-12-02 DIAGNOSIS — I110 Hypertensive heart disease with heart failure: Secondary | ICD-10-CM | POA: Diagnosis not present

## 2019-12-05 DIAGNOSIS — I5032 Chronic diastolic (congestive) heart failure: Secondary | ICD-10-CM | POA: Diagnosis not present

## 2019-12-05 DIAGNOSIS — I110 Hypertensive heart disease with heart failure: Secondary | ICD-10-CM | POA: Diagnosis not present

## 2019-12-05 DIAGNOSIS — I872 Venous insufficiency (chronic) (peripheral): Secondary | ICD-10-CM | POA: Diagnosis not present

## 2019-12-05 DIAGNOSIS — L97828 Non-pressure chronic ulcer of other part of left lower leg with other specified severity: Secondary | ICD-10-CM | POA: Diagnosis not present

## 2019-12-05 DIAGNOSIS — G4733 Obstructive sleep apnea (adult) (pediatric): Secondary | ICD-10-CM | POA: Diagnosis not present

## 2019-12-05 DIAGNOSIS — L97219 Non-pressure chronic ulcer of right calf with unspecified severity: Secondary | ICD-10-CM | POA: Diagnosis not present

## 2019-12-09 ENCOUNTER — Encounter (HOSPITAL_BASED_OUTPATIENT_CLINIC_OR_DEPARTMENT_OTHER): Payer: Medicare Other | Admitting: Internal Medicine

## 2019-12-09 ENCOUNTER — Other Ambulatory Visit: Payer: Self-pay

## 2019-12-09 DIAGNOSIS — L97821 Non-pressure chronic ulcer of other part of left lower leg limited to breakdown of skin: Secondary | ICD-10-CM | POA: Diagnosis not present

## 2019-12-09 DIAGNOSIS — I890 Lymphedema, not elsewhere classified: Secondary | ICD-10-CM | POA: Diagnosis not present

## 2019-12-09 DIAGNOSIS — I87333 Chronic venous hypertension (idiopathic) with ulcer and inflammation of bilateral lower extremity: Secondary | ICD-10-CM | POA: Diagnosis not present

## 2019-12-09 DIAGNOSIS — L97811 Non-pressure chronic ulcer of other part of right lower leg limited to breakdown of skin: Secondary | ICD-10-CM | POA: Diagnosis not present

## 2019-12-09 NOTE — Progress Notes (Signed)
[name] [name], [name] [name] ([id number]) Visit Report for 12/09/2019 Arrival Information Details Patient Name: Date of Service: [name] [name], [name] [name] 12/09/2019 12:30 PM Medical Record [id number] Patient Account Number: [id number] Date of Birth/Sex: Treating RN: 07-16-1956 (64 y.o. [name] [name] Primary Care Toula Miyasaki: [name] Other Clinician: Referring Versa Craton: Treating Mari Battaglia/Extender:Robson, Donnamae Jude, August Albino in Treatment: 2 Visit Information History Since Last Visit Added or deleted any medications: No Patient Arrived: [name]: No Arrival Time: 13:00 Had a fall or experienced change in No Accompanied By: self activities of daily living that may affect Transfer Assistance: None risk of falls: Patient Identification Verified: Yes Signs or symptoms of abuse/neglect since last No Secondary Verification Process Completed: Yes visito Patient Requires Transmission-Based No Hospitalized since last visit: No Precautions: Implantable device outside of the clinic excluding No Patient Has Alerts: Yes cellular tissue based products placed in the center Patient Alerts: R ABI = since last visit: 1.25 Has Dressing in Place as Prescribed: Yes L ABI = Has Compression in Place as Prescribed: Yes 1.27 Pain Present Now: No Electronic Signature(s) Signed: 12/09/2019 5:48:19 PM By: Kela Millin Entered By: Kela Millin on 12/09/2019 13:00:26 -------------------------------------------------------------------------------- Compression Therapy Details Patient Name: Date of Service: [name] [name], [name] [name] 12/09/2019 12:30 PM Medical Record [id number] Patient Account Number: [id number] Date of Birth/Sex: Treating RN: [birth year] (63 y.o. [name]) [name] Primary Care Amyia Lodwick: [name] Other Clinician: Referring Keshaun Dubey: Treating Aaro Meyers/Extender:Robson, Donnamae Jude, August Albino in Treatment: 2 Compression Therapy  Performed for Wound Wound #65 Left,Lateral Lower Leg Assessment: Performed By: Clinician [name], RN Compression Type: Four Layer Post Procedure Diagnosis Same as Pre-procedure Electronic Signature(s) Signed: 12/09/2019 5:59:23 PM By: [name] RN Entered By: [name] on 12/09/2019 13:35:47 -------------------------------------------------------------------------------- Compression Therapy Details Patient Name: Date of Service: [name] [name], [name] [name] 12/09/2019 12:30 PM Medical Record [id number] Patient Account Number: [id number] Date of Birth/Sex: Treating RN: Oct 31, 1955 (63 y.o. [name] Primary Care Fleur Audino: [name] Other Clinician: Referring Dontell Mian: Treating Ocie Tino/Extender:Robson, Donnamae Jude, August Albino in Treatment: 2 Compression Therapy Performed for Wound Wound #66 Right,Lateral Lower Leg Assessment: Performed By: Clinician [name], RN Compression Type: Four Layer Post Procedure Diagnosis Same as Pre-procedure Electronic Signature(s) Signed: 12/09/2019 5:59:23 PM By: [name] RN Entered By: [name] on 12/09/2019 13:35:48 -------------------------------------------------------------------------------- Encounter Discharge Information Details Patient Name: Date of Service: [name] Igo. 12/09/2019 12:30 PM Medical Record [id number] Patient Account Number: [id number] Date of Birth/Sex: Treating RN: Jun 06, 1956 (64 y.o. [name] [name] Primary Care Ashyah Quizon: [name] Other Clinician: Referring Kennedy Brines: Treating Brack Shaddock/Extender:Robson, Donnamae Jude, August Albino in Treatment: 2 Encounter Discharge Information Items Discharge Condition: Stable Ambulatory Status: Cane Discharge Destination: Home Transportation: Private Auto Accompanied By: self Schedule Follow-up Appointment: Yes Clinical Summary of Care: Patient Declined Electronic Signature(s) Signed: 12/09/2019 5:48:19 PM By: Kela Millin Entered By: Kela Millin on 12/09/2019 14:00:05 -------------------------------------------------------------------------------- Lower Extremity Assessment Details Patient Name: Date of Service: [name] [name], [name] [name] 12/09/2019 12:30 PM Medical Record [id number] Patient Account Number: [id number] Date of Birth/Sex: Treating RN: 22-Dec-1955 (64 y.o. [name] [name] Primary Care Donatello Kleve: [name] Other Clinician: Referring Toney Difatta: Treating Malahki Gasaway/Extender:Robson, Donnamae Jude, August Albino in Treatment: 2 Edema Assessment Assessed: [Left: No] [Right: No] Edema: [Left: Yes] [Right: Yes] Calf Left: Right: Point of Measurement: 30 cm From Medial Instep 43.5 cm 44 cm Ankle Left: Right: Point of Measurement: 13 cm From Medial Instep 26 cm 28 cm Vascular Assessment Pulses: Dorsalis Pedis Palpable: [Left:Yes] [Right:Yes] Electronic Signature(s) Signed: 12/09/2019 5:48:19 PM By:  Dwiggins, Larene Beach Entered By: Kela Millin on 12/09/2019 13:01:44 -------------------------------------------------------------------------------- Multi Wound Chart Details Patient Name: Date of Service: [name] [name], [name] [name] 12/09/2019 12:30 PM Medical Record [id number] Patient Account Number: [id number] Date of Birth/Sex: Treating RN: 1956-06-30 (63 y.o. [name]) [name] Primary Care Greer Koeppen: [name] Other Clinician: Referring Daily Crate: Treating Jalecia Leon/Extender:Robson, Donnamae Jude, August Albino in Treatment: 2 Vital Signs Height(in): 70 Pulse(bpm): 102 Weight(lbs): 360 Blood Pressure(mmHg): 134/71 Body Mass Index(BMI): 52 Temperature(F): 98 Respiratory 20 Rate(breaths/min): Photos: [65:No Photos] [66:No Photos] [N/A:N/A] Wound Location: [65:Left Lower Leg - Lateral] [66:Right Lower Leg - Lateral N/A] Wounding Event: [65:Gradually Appeared] [66:Gradually Appeared] [N/A:N/A] Primary Etiology: [65:Venous Leg Ulcer] [66:Venous Leg Ulcer] [N/A:N/A] Secondary  Etiology: [65:Lymphedema] [66:Lymphedema] [N/A:N/A] Comorbid History: [65:Lymphedema, Sleep Apnea, Lymphedema, Sleep Apnea, N/A Congestive Heart Failure, Congestive Heart Failure, Hypertension, Peripheral Hypertension, Peripheral Venous Disease, Osteoarthritis] [66:Venous Disease, Osteoarthritis] Date Acquired: [65:09/08/2019] [66:09/08/2019] [N/A:N/A] Weeks of Treatment: [65:2] [66:2] [N/A:N/A] Wound Status: [65:Open] [66:Open] [N/A:N/A] Clustered Wound: [65:Yes] [66:Yes] [N/A:N/A] Clustered Quantity: [65:2] [66:2] [N/A:N/A] Measurements L x W x D 1.3x3x0.2 [66:1.5x2x0.1] [N/A:N/A] (cm) Area (cm) : [65:3.063] [66:2.356] [N/A:N/A] Volume (cm) : [65:0.613] [66:0.236] [N/A:N/A] % Reduction in Area: [65:99.50%] [66:99.50%] [N/A:N/A] % Reduction in Volume: 99.00% [66:99.50%] [N/A:N/A] Classification: [65:Full Thickness Without Exposed Support Structures Exposed Support Structures] [66:Full Thickness Without] [N/A:N/A] Exudate Amount: [65:Large] [66:Medium] [N/A:N/A] Exudate Type: [65:Serosanguineous] [66:Serosanguineous] [N/A:N/A] Exudate Color: [65:red, brown] [66:red, brown] [N/A:N/A] Wound Margin: [65:Flat and Intact] [66:Flat and Intact] [N/A:N/A] Granulation Amount: [65:Large (67-100%)] [66:Large (67-100%)] [N/A:N/A] Granulation Quality: [65:Red] [66:Red] [N/A:N/A] Necrotic Amount: [65:None Present (0%)] [66:None Present (0%)] [N/A:N/A] Exposed Structures: [65:Fat Layer (Subcutaneous Fat Layer (Subcutaneous N/A Tissue) Exposed: Yes Fascia: No Tendon: No Muscle: No Joint: No Bone: No] [66:Tissue) Exposed: Yes Fascia: No Tendon: No Muscle: No Joint: No Bone: No] Epithelialization: [65:None] [66:None Compression Therapy] [N/A:N/A N/A] Treatment Notes Wound #65 (Left, Lateral Lower Leg) 1. Cleanse With Wound Cleanser Soap and water 2. Periwound Care TCA Cream 3. Primary Dressing Applied Calcium Alginate Ag 4. Secondary Dressing ABD Pad 6. Support Layer Applied 4 layer compression  wrap Wound #66 (Right, Lateral Lower Leg) 1. Cleanse With Wound Cleanser Soap and water 2. Periwound Care TCA Cream 3. Primary Dressing Applied Calcium Alginate Ag 4. Secondary Dressing ABD Pad 6. Support Layer Applied 4 layer compression Water quality scientist) Signed: 12/09/2019 5:53:09 PM By: Linton Ham MD Signed: 12/09/2019 5:59:23 PM By: [name] RN Entered By: Linton Ham on 12/09/2019 14:09:12 -------------------------------------------------------------------------------- Multi-Disciplinary Care Plan Details Patient Name: Date of Service: [name] Igo. 12/09/2019 12:30 PM Medical Record [id number] Patient Account Number: [id number] Date of Birth/Sex: Treating RN: Nov 08, 1955 (63 y.o. [name] Primary Care Prem Coykendall: [name] Other Clinician: Referring Artavia Jeanlouis: Treating Fares Ramthun/Extender:Robson, Donnamae Jude, August Albino in Treatment: 2 Active Inactive Wound/Skin Impairment Nursing Diagnoses: Knowledge deficit related to ulceration/compromised skin integrity Goals: Patient/caregiver will verbalize understanding of skin care regimen Date Initiated: 11/25/2019 Target Resolution Date: 12/26/2019 Goal Status: Active Ulcer/skin breakdown will have a volume reduction of 30% by week 4 Date Initiated: 11/25/2019 Target Resolution Date: 12/26/2019 Goal Status: Active Interventions: Assess patient/caregiver ability to obtain necessary supplies Assess patient/caregiver ability to perform ulcer/skin care regimen upon admission and as needed Assess ulceration(s) every visit Notes: Electronic Signature(s) Signed: 12/09/2019 5:59:23 PM By: [name] RN Entered By: [name] on 12/09/2019 13:28:41 -------------------------------------------------------------------------------- Pain Assessment Details Patient Name: Date of Service: [name] [name], [name] [name] 12/09/2019 12:30 PM Medical Record [id number] Patient Account Number: [id number] Date  of Birth/Sex: Treating RN: 06-Feb-1956 (64 y.o. M) Dwiggins,  Larene Beach Primary Care Ej Pinson: [name] Other Clinician: Referring Vennie Salsbury: Treating Seema Blum/Extender:Robson, Donnamae Jude, August Albino in Treatment: 2 Active Problems Location of Pain Severity and Description of Pain Patient Has Paino No Site Locations Pain Management and Medication Current Pain Management: Electronic Signature(s) Signed: 12/09/2019 5:48:19 PM By: Kela Millin Entered By: Kela Millin on 12/09/2019 13:00:52 -------------------------------------------------------------------------------- Patient/Caregiver Education Details Patient Name: Date of Service: [name] Igo 3/23/2021andnbsp12:30 PM Medical Record [id number] Patient Account Number: [id number] Date of Birth/Gender: Treating RN: 1955-11-18 (63 y.o. [name] Primary Care Physician: [name] Other Clinician: Referring Physician: Treating Physician/Extender:Robson, Donnamae Jude, August Albino in Treatment: 2 Education Assessment Education Provided To: Patient Education Topics Provided Wound/Skin Impairment: Methods: Explain/Verbal Responses: State content correctly Electronic Signature(s) Signed: 12/09/2019 5:59:23 PM By: [name] RN Entered By: [name] on 12/09/2019 13:30:09 -------------------------------------------------------------------------------- Wound Assessment Details Patient Name: Date of Service: [name] [name], [name] [name] 12/09/2019 12:30 PM Medical Record [id number] Patient Account Number: [id number] Date of Birth/Sex: Treating RN: 1956/02/10 (64 y.o. [name] [name] Primary Care Shaakira Borrero: [name] Other Clinician: Referring Jonell Brumbaugh: Treating Brylee Mcgreal/Extender:Robson, Donnamae Jude, August Albino in Treatment: 2 Wound Status Wound Number: 65 Primary Venous Leg Ulcer Etiology: Wound Location: Left Lower Leg - Lateral Secondary Lymphedema Wounding Event: Gradually  Appeared Etiology: Date Acquired: 09/08/2019 Wound Open Weeks Of Treatment: 2 Status: Clustered Wound: Yes Comorbid Lymphedema, Sleep Apnea, Congestive History: Heart Failure, Hypertension, Peripheral Venous Disease, Osteoarthritis Wound Measurements Length: (cm) 1.3 % Reduct Width: (cm) 3 % Reduct Depth: (cm) 0.2 Epitheli Clustered Quantity: 2 Tunnelin Area: (cm) 3.063 Undermi Volume: (cm) 0.613 Wound Description Classification: Full Thickness Without Exposed Support Foul O Structures Slough Wound Flat and Intact Margin: Exudate Large Amount: Exudate Serosanguineous Type: Exudate red, brown Color: Wound Bed Granulation Amount: Large (67-100%) Granulation Quality: Red Fascia Necrotic Amount: None Present (0%) Fat Lay Tendon Muscle Joint E Bone Ex dor After Cleansing: No /Fibrino No Exposed Structure Exposed: No er (Subcutaneous Tissue) Exposed: Yes Exposed: No Exposed: No xposed: No posed: No ion in Area: 99.5% ion in Volume: 99% alization: None g: No ning: No Treatment Notes Wound #65 (Left, Lateral Lower Leg) 1. Cleanse With Wound Cleanser Soap and water 2. Periwound Care TCA Cream 3. Primary Dressing Applied Calcium Alginate Ag 4. Secondary Dressing ABD Pad 6. Support Layer Applied 4 layer compression wrap Electronic Signature(s) Signed: 12/09/2019 5:48:19 PM By: Kela Millin Entered By: Kela Millin on 12/09/2019 13:04:35 -------------------------------------------------------------------------------- Wound Assessment Details Patient Name: Date of Service: [name] [name], [name] [name] 12/09/2019 12:30 PM Medical Record [id number] Patient Account Number: [id number] Date of Birth/Sex: Treating RN: 27-Jul-1956 (64 y.o. [name] [name] Primary Care Isidoro Santillana: [name] Other Clinician: Referring Mikaelyn Arthurs: Treating Prarthana Parlin/Extender:Robson, Donnamae Jude, August Albino in Treatment: 2 Wound Status Wound Number: 66 Primary Venous  Leg Ulcer Etiology: Wound Location: Right Lower Leg - Lateral Secondary Lymphedema Wounding Event: Gradually Appeared Wounding Event: Gradually Appeared Etiology: Date Acquired: 09/08/2019 Wound Open Weeks Of Treatment: 2 Status: Clustered Wound: Yes Comorbid Lymphedema, Sleep Apnea, Congestive History: Heart Failure, Hypertension, Peripheral Venous Disease, Osteoarthritis Wound Measurements Length: (cm) 1.5 % Reduct Width: (cm) 2 % Reduct Depth: (cm) 0.1 Epitheli Clustered Quantity: 2 Tunnelin Area: (cm) 2.356 Undermi Volume: (cm) 0.236 Wound Description Classification: Full Thickness Without Exposed Support Foul Odo Structures Slough/F Wound Flat and Intact Margin: Exudate Medium Amount: Exudate Serosanguineous Type: Exudate red, brown Color: Wound Bed Granulation Amount: Large (67-100%) Granulation Quality: Red Fascia E Necrotic Amount: None Present (0%) Fat Laye Tendon E Muscle E Joint Ex Bone  Exp r After Cleansing: No ibrino No Exposed Structure xposed: No r (Subcutaneous Tissue) Exposed: Yes xposed: No xposed: No posed: No osed: No ion in Area: 99.5% ion in Volume: 99.5% alization: None g: No ning: No Treatment Notes Wound #66 (Right, Lateral Lower Leg) 1. Cleanse With Wound Cleanser Soap and water 2. Periwound Care TCA Cream 3. Primary Dressing Applied Calcium Alginate Ag 4. Secondary Dressing ABD Pad 6. Support Layer Applied 4 layer compression wrap Electronic Signature(s) Signed: 12/09/2019 5:48:19 PM By: Kela Millin Entered By: Kela Millin on 12/09/2019 13:05:34 -------------------------------------------------------------------------------- Vitals Details Patient Name: Date of Service: [name] Igo. 12/09/2019 12:30 PM Medical Record [id number] Patient Account Number: [id number] Date of Birth/Sex: Treating RN: [birth year] (64 y.o. [name] [name] Primary Care Evelia Waskey: [name] Other  Clinician: Referring Kamariyah Timberlake: Treating Jezel Basto/Extender:Robson, Donnamae Jude, August Albino in Treatment: 2 Vital Signs Time Taken: 13:00 Temperature (F): 98 Height (in): 70 Pulse (bpm): 59 Weight (lbs): 360 Respiratory Rate (breaths/min): 20 Body Mass Index (BMI): 51.6 Blood Pressure (mmHg): 134/71 Reference Range: 80 - 120 mg / dl Electronic Signature(s) Signed: 12/09/2019 5:48:19 PM By: Kela Millin Entered By: Kela Millin on 12/09/2019 13:00:45

## 2019-12-09 NOTE — Progress Notes (Signed)
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 mostly on his lateral lower legs. He has a new area on the right dorsal foot today just in close proximity to his toes. His edema control is marginal. He will not use his compression pumps. He has home health changing the dressings. He tells [place] that his wife is in the hospital in [place] with heart failure. I  suspect he is sitting at her bedside for most the day. His legs are probably dependent. 12/4; 1 month follow-up. He arrives with everything on his legs completely closed. He has some form of external compression garment at home as well as compression pumps. READMISSION 11/25/2019 We discharge this patient on 08/22/2019 with everything on his bilateral lower legs closed. He had an external compression stocking for both legs at home as well as compression pumps although admittedly he has never been compliant with the latter. He states his legs stay closed for about 2 or 3 weeks. He ended up in hospital at [place] from 12/22 through 12/29 with Covid infection. He came home and is gradually been regaining his strength. Currently has bilateral innumerable wounds almost circumferentially on both lower legs in the setting of severe venous inflammation but no current infection. The patient has diastolic heart failure hypertension history of TIA chronic venous ulcers had obstructive sleep apnea although he is not compliant with CPAP. He was never felt to have an arterial issue in this clinic his pedal pulses and ABIs have been normal most recently in September/20 at 1.25 on the right and 1.27 on the left 3/23; he arrives with much less edema in  both legs. He has home health changing the dressings. Been using silver alginate. He only has an open area along the wrap line of both legs. The rest of this seems to be closed. Objective Constitutional Vitals Time Taken: 1:00 PM, Height: 70 in, Weight: 360 lbs, BMI: 51.6, Temperature: 98 F, Pulse: 59 bpm, Respiratory Rate: 20 breaths/min, Blood Pressure: 134/71 mmHg. Cardiovascular Pedal pulses are palpable. General Notes: Wound exam; small wounds on both upper wrap sites. These are circumferential but superficial. The edema control in his legs is a lot better. No evidence of active infection Integumentary (Hair, Skin) Changes of chronic venous  insufficiency. Wound #65 status is Open. Original cause of wound was Gradually Appeared. The wound is located on the Left,Lateral Lower Leg. The wound measures 1.3cm length x 3cm width x 0.2cm depth; 3.063cm^2 area and 0.613cm^3 volume. There is Fat Layer (Subcutaneous Tissue) Exposed exposed. There is no tunneling or undermining noted. There is a large amount of serosanguineous drainage noted. The wound margin is flat and intact. There is large (67-100%) red granulation within the wound bed. There is no necrotic tissue within the wound bed. Wound #66 status is Open. Original cause of wound was Gradually Appeared. The wound is located on the Right,Lateral Lower Leg. The wound measures 1.5cm length x 2cm width x 0.1cm depth; 2.356cm^2 area and 0.236cm^3 volume. There is Fat Layer (Subcutaneous Tissue) Exposed exposed. There is no tunneling or undermining noted. There is a medium amount of serosanguineous drainage noted. The wound margin is flat and intact. There is large (67-100%) red granulation within the wound bed. There is no necrotic tissue within the wound bed. 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 There was a Four Layer Compression Therapy Procedure by [name], RN. Post procedure Diagnosis Wound #65: Same as Pre-Procedure Wound #66 Pre-procedure diagnosis of Wound #66 is a Venous Leg Ulcer located on the Right,Lateral Lower Leg . There was a Four Layer Compression Therapy Procedure by [name], RN. Post procedure Diagnosis Wound #66: Same as  Pre-Procedure Plan Follow-up Appointments: Return Appointment in 2 weeks. Dressing Change Frequency: Other: - 2 times per week Skin Barriers/Peri-Wound Care: TCA Cream or Ointment - lightly from knee to ankle on left and right lower legs Wound Cleansing: Wound #65 Left,Lateral Lower Leg: Clean wound with Normal Saline. Wound #66 Right,Lateral Lower Leg: Clean wound with Normal Saline. Primary Wound Dressing: Wound #65 Left,Lateral Lower Leg: Calcium Alginate with Silver Wound #66 Right,Lateral Lower Leg: Calcium Alginate with Silver Secondary Dressing: Wound #65 Left,Lateral Lower Leg: ABD pad Wound #66 Right,Lateral Lower Leg: ABD pad Edema Control: 4 layer compression - Bilateral Elevate legs to the level of the heart or above for 30 minutes daily and/or when sitting, a frequency of: Segmental Compressive Device. - 1 hour per day Home Health: Wound #65 Left,Lateral Lower Leg: Continue Home Health skilled nursing for wound care. - amedysis Wound #66 Right,Lateral Lower Leg: [facility] skilled nursing for wound care. - amedysis 1. We will continue with silver alginate TCA under 4-layer compression 2. He is only using his pumps 3 times a week which for him is  really quite frequent I have asked for once a day but he complains about having to bring his wife to doctors appointments for Electronic Signature(s) Signed: 12/09/2019 5:53:09 PM By: Linton Ham MD Entered By: Linton Ham on 12/09/2019 14:11:24 -------------------------------------------------------------------------------- SuperBill Details Patient Name: Date of Service: [name] 12/09/2019 Medical Record [id number] Patient Account Number: [id number] Date of Birth/Sex: Treating RN: 1955-12-10 (63 y.o. [name]) Dolores Lory, Morey Hummingbird Primary Care Provider: [name] Other Clinician: Referring Provider: Treating Provider/Extender:Celes Dedic, Donnamae Jude, August Albino in Treatment: 2 Diagnosis  Coding ICD-10 Codes Code Description [phone] Chronic venous hypertension (idiopathic) with ulcer and inflammation of bilateral lower extremity I89.0 Lymphedema, not elsewhere classified L97.821 Non-pressure chronic ulcer of other part of left lower leg limited to breakdown of skin L97.811 Non-pressure chronic ulcer of other part of right lower leg limited to breakdown of skin Physician Procedures CPT4: Code [id number] Description: 213 - WC PHYS LEVEL 3 - EST PT ICD-10 Diagnosis Description I87.333 Chronic venous hypertension (idiopathic) with ulcer and in lower extremity I89.0 Lymphedema, not elsewhere classified L97.821 Non-pressure chronic ulcer of other part of  left lower leg skin L97.811 Non-pressure chronic ulcer of other part of right lower le skin Modifier: flammation of bil limited to break g limited to brea Quantity: 1 ateral down of kdown of Electronic Signature(s) Signed: 12/09/2019 5:53:09 PM By: Linton Ham MD Entered By: Linton Ham on 12/09/2019 14:11:47

## 2019-12-10 DIAGNOSIS — I5032 Chronic diastolic (congestive) heart failure: Secondary | ICD-10-CM | POA: Diagnosis not present

## 2019-12-10 DIAGNOSIS — G4733 Obstructive sleep apnea (adult) (pediatric): Secondary | ICD-10-CM | POA: Diagnosis not present

## 2019-12-10 DIAGNOSIS — L97828 Non-pressure chronic ulcer of other part of left lower leg with other specified severity: Secondary | ICD-10-CM | POA: Diagnosis not present

## 2019-12-10 DIAGNOSIS — L97219 Non-pressure chronic ulcer of right calf with unspecified severity: Secondary | ICD-10-CM | POA: Diagnosis not present

## 2019-12-10 DIAGNOSIS — I110 Hypertensive heart disease with heart failure: Secondary | ICD-10-CM | POA: Diagnosis not present

## 2019-12-10 DIAGNOSIS — I872 Venous insufficiency (chronic) (peripheral): Secondary | ICD-10-CM | POA: Diagnosis not present

## 2019-12-12 DIAGNOSIS — L97219 Non-pressure chronic ulcer of right calf with unspecified severity: Secondary | ICD-10-CM | POA: Diagnosis not present

## 2019-12-12 DIAGNOSIS — I5032 Chronic diastolic (congestive) heart failure: Secondary | ICD-10-CM | POA: Diagnosis not present

## 2019-12-12 DIAGNOSIS — G4733 Obstructive sleep apnea (adult) (pediatric): Secondary | ICD-10-CM | POA: Diagnosis not present

## 2019-12-12 DIAGNOSIS — I110 Hypertensive heart disease with heart failure: Secondary | ICD-10-CM | POA: Diagnosis not present

## 2019-12-12 DIAGNOSIS — I872 Venous insufficiency (chronic) (peripheral): Secondary | ICD-10-CM | POA: Diagnosis not present

## 2019-12-12 DIAGNOSIS — L97828 Non-pressure chronic ulcer of other part of left lower leg with other specified severity: Secondary | ICD-10-CM | POA: Diagnosis not present

## 2019-12-16 DIAGNOSIS — I872 Venous insufficiency (chronic) (peripheral): Secondary | ICD-10-CM | POA: Diagnosis not present

## 2019-12-16 DIAGNOSIS — I110 Hypertensive heart disease with heart failure: Secondary | ICD-10-CM | POA: Diagnosis not present

## 2019-12-16 DIAGNOSIS — L97219 Non-pressure chronic ulcer of right calf with unspecified severity: Secondary | ICD-10-CM | POA: Diagnosis not present

## 2019-12-16 DIAGNOSIS — L97828 Non-pressure chronic ulcer of other part of left lower leg with other specified severity: Secondary | ICD-10-CM | POA: Diagnosis not present

## 2019-12-16 DIAGNOSIS — G4733 Obstructive sleep apnea (adult) (pediatric): Secondary | ICD-10-CM | POA: Diagnosis not present

## 2019-12-16 DIAGNOSIS — I5032 Chronic diastolic (congestive) heart failure: Secondary | ICD-10-CM | POA: Diagnosis not present

## 2019-12-18 DIAGNOSIS — L97219 Non-pressure chronic ulcer of right calf with unspecified severity: Secondary | ICD-10-CM | POA: Diagnosis not present

## 2019-12-18 DIAGNOSIS — I110 Hypertensive heart disease with heart failure: Secondary | ICD-10-CM | POA: Diagnosis not present

## 2019-12-18 DIAGNOSIS — I5032 Chronic diastolic (congestive) heart failure: Secondary | ICD-10-CM | POA: Diagnosis not present

## 2019-12-18 DIAGNOSIS — I872 Venous insufficiency (chronic) (peripheral): Secondary | ICD-10-CM | POA: Diagnosis not present

## 2019-12-18 DIAGNOSIS — G4733 Obstructive sleep apnea (adult) (pediatric): Secondary | ICD-10-CM | POA: Diagnosis not present

## 2019-12-18 DIAGNOSIS — L97828 Non-pressure chronic ulcer of other part of left lower leg with other specified severity: Secondary | ICD-10-CM | POA: Diagnosis not present

## 2019-12-19 DIAGNOSIS — I872 Venous insufficiency (chronic) (peripheral): Secondary | ICD-10-CM | POA: Diagnosis not present

## 2019-12-19 DIAGNOSIS — L97828 Non-pressure chronic ulcer of other part of left lower leg with other specified severity: Secondary | ICD-10-CM | POA: Diagnosis not present

## 2019-12-19 DIAGNOSIS — Z9981 Dependence on supplemental oxygen: Secondary | ICD-10-CM | POA: Diagnosis not present

## 2019-12-19 DIAGNOSIS — I110 Hypertensive heart disease with heart failure: Secondary | ICD-10-CM | POA: Diagnosis not present

## 2019-12-19 DIAGNOSIS — G4733 Obstructive sleep apnea (adult) (pediatric): Secondary | ICD-10-CM | POA: Diagnosis not present

## 2019-12-19 DIAGNOSIS — Z8616 Personal history of COVID-19: Secondary | ICD-10-CM | POA: Diagnosis not present

## 2019-12-19 DIAGNOSIS — L97219 Non-pressure chronic ulcer of right calf with unspecified severity: Secondary | ICD-10-CM | POA: Diagnosis not present

## 2019-12-19 DIAGNOSIS — I5032 Chronic diastolic (congestive) heart failure: Secondary | ICD-10-CM | POA: Diagnosis not present

## 2019-12-19 DIAGNOSIS — Z8673 Personal history of transient ischemic attack (TIA), and cerebral infarction without residual deficits: Secondary | ICD-10-CM | POA: Diagnosis not present

## 2019-12-19 DIAGNOSIS — E669 Obesity, unspecified: Secondary | ICD-10-CM | POA: Diagnosis not present

## 2019-12-22 DIAGNOSIS — I110 Hypertensive heart disease with heart failure: Secondary | ICD-10-CM | POA: Diagnosis not present

## 2019-12-22 DIAGNOSIS — L97219 Non-pressure chronic ulcer of right calf with unspecified severity: Secondary | ICD-10-CM | POA: Diagnosis not present

## 2019-12-22 DIAGNOSIS — I872 Venous insufficiency (chronic) (peripheral): Secondary | ICD-10-CM | POA: Diagnosis not present

## 2019-12-22 DIAGNOSIS — L97828 Non-pressure chronic ulcer of other part of left lower leg with other specified severity: Secondary | ICD-10-CM | POA: Diagnosis not present

## 2019-12-22 DIAGNOSIS — G4733 Obstructive sleep apnea (adult) (pediatric): Secondary | ICD-10-CM | POA: Diagnosis not present

## 2019-12-22 DIAGNOSIS — I5032 Chronic diastolic (congestive) heart failure: Secondary | ICD-10-CM | POA: Diagnosis not present

## 2019-12-23 ENCOUNTER — Encounter (HOSPITAL_BASED_OUTPATIENT_CLINIC_OR_DEPARTMENT_OTHER): Payer: Medicare Other | Attending: Internal Medicine | Admitting: Internal Medicine

## 2019-12-23 ENCOUNTER — Other Ambulatory Visit: Payer: Self-pay

## 2019-12-23 DIAGNOSIS — L97812 Non-pressure chronic ulcer of other part of right lower leg with fat layer exposed: Secondary | ICD-10-CM | POA: Insufficient documentation

## 2019-12-23 DIAGNOSIS — I110 Hypertensive heart disease with heart failure: Secondary | ICD-10-CM | POA: Insufficient documentation

## 2019-12-23 DIAGNOSIS — Z9119 Patient's noncompliance with other medical treatment and regimen: Secondary | ICD-10-CM | POA: Insufficient documentation

## 2019-12-23 DIAGNOSIS — G4733 Obstructive sleep apnea (adult) (pediatric): Secondary | ICD-10-CM | POA: Diagnosis not present

## 2019-12-23 DIAGNOSIS — I890 Lymphedema, not elsewhere classified: Secondary | ICD-10-CM | POA: Insufficient documentation

## 2019-12-23 DIAGNOSIS — Z6841 Body Mass Index (BMI) 40.0 and over, adult: Secondary | ICD-10-CM | POA: Insufficient documentation

## 2019-12-23 DIAGNOSIS — L97822 Non-pressure chronic ulcer of other part of left lower leg with fat layer exposed: Secondary | ICD-10-CM | POA: Diagnosis not present

## 2019-12-23 DIAGNOSIS — I87333 Chronic venous hypertension (idiopathic) with ulcer and inflammation of bilateral lower extremity: Secondary | ICD-10-CM | POA: Insufficient documentation

## 2019-12-23 DIAGNOSIS — Z8673 Personal history of transient ischemic attack (TIA), and cerebral infarction without residual deficits: Secondary | ICD-10-CM | POA: Insufficient documentation

## 2019-12-23 DIAGNOSIS — L97811 Non-pressure chronic ulcer of other part of right lower leg limited to breakdown of skin: Secondary | ICD-10-CM | POA: Diagnosis not present

## 2019-12-23 DIAGNOSIS — L97821 Non-pressure chronic ulcer of other part of left lower leg limited to breakdown of skin: Secondary | ICD-10-CM | POA: Diagnosis not present

## 2019-12-23 DIAGNOSIS — I509 Heart failure, unspecified: Secondary | ICD-10-CM | POA: Diagnosis not present

## 2019-12-23 DIAGNOSIS — Z8616 Personal history of COVID-19: Secondary | ICD-10-CM | POA: Diagnosis not present

## 2019-12-24 NOTE — Progress Notes (Signed)
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 pain. He also almost circumferential epithelial loss on the right. He has home health changing the dressing. We are doing 2-week follow-ups. He lives in [place] 10/23; 2-week follow-up. The patient took his antibiotics he seems to have tolerated this well. He has still areas on the right and  left calf left more substantially. I think he has some improvement in the epithelialization. He has new wounds on the left dorsal foot today. He says he has been using compression pumps 11/6; two-week follow-up. The patient arrived still with wounds mostly on his lateral lower legs. He has a new area on the right dorsal foot today just in close proximity to his toes. His edema control is marginal. He will not use his compression pumps. He has home health changing the dressings. He tells [place] that his wife is in the hospital in [place] with heart failure. I suspect he is sitting at her bedside for most the day. His legs are probably dependent. 12/4; 1 month follow-up. He arrives with everything on his legs completely closed. He has some form of external compression garment at home as well as compression pumps. READMISSION 11/25/2019 We discharge this patient on 08/22/2019 with everything on his bilateral lower legs closed. He had an external compression stocking for both legs at home as well as compression pumps although admittedly he has never been compliant with the latter. He states his legs stay closed for about 2 or 3 weeks. He ended up in hospital at [facility] from 12/22 through 12/29 with Covid infection. He came home and is gradually  been regaining his strength. Currently has bilateral innumerable wounds almost circumferentially on both lower legs in the setting of severe venous inflammation but no current infection. The patient has diastolic heart failure hypertension history of TIA chronic venous ulcers had obstructive sleep apnea although he is not compliant with CPAP. He was never felt to have an arterial issue in this clinic his pedal pulses and ABIs have been normal most recently in September/20 at 1.25 on the right and 1.27 on the left 3/23; he arrives with much less edema in both legs. He has home health changing the dressings. Been using silver alginate. He only has an open  area along the wrap line of both legs. The rest of this seems to be closed. 4/6; better edema control in our compression wraps. He has not been using his external compression pumps he tells me because his wife was hospitalized for about 10 days. We have been using silver alginate on the wounds. Objective Constitutional Patient is hypertensive.. Pulse regular and within target range for patient.Marland Kitchen Respirations regular, non-labored and within target range.. Temperature is normal and within the target range for the patient.Marland Kitchen Appears in no distress. Vitals Time Taken: 1:02 PM, Height: 70 in, Weight: 360 lbs, BMI: 51.6, Temperature: 97.5 F, Pulse: 61 bpm, Respiratory Rate: 20 breaths/min, Blood Pressure: 160/77 mmHg. Cardiovascular Pedal pulses are palpable. Edema present in both extremities. However it is much better controlled with our compression wraps.. General Notes: Wound exam; small wounds on the left leg which is almost circumferential. He has some eschar on the right lateral leg but most of this appears to be healed. Integumentary (Hair, Skin) Skin changes of chronic severe venous insufficiency with hemosiderin deposition. Wound #65 status is Open. Original cause of wound was Gradually Appeared. The wound is located on the Left,Circumferential Lower Leg. The wound measures 14.5cm length x 38cm width x 0.1cm depth; 432.754cm^2 area and 43.275cm^3 volume. There is Fat Layer (Subcutaneous Tissue) Exposed exposed. There is no tunneling or undermining noted. There is a medium amount of serosanguineous drainage noted. The wound margin is flat and intact. There is large (67-100%) red granulation within the wound bed. There is no necrotic tissue within the wound bed. Wound #66 status is Open. Original cause of wound was Gradually Appeared. The wound is located on the Right,Lateral Lower Leg. The wound measures 0.5cm length x 0.5cm width x 0.1cm depth; 0.196cm^2 area and 0.02cm^3 volume. There is  Fat Layer (Subcutaneous Tissue) Exposed exposed. There is no tunneling or undermining noted. There is a medium amount of serosanguineous drainage noted. The wound margin is flat and intact. There is large (67-100%) red granulation within the wound bed. There is no necrotic tissue within the wound bed. 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 There was a Four Layer Compression Therapy Procedure by [name], RN. Post procedure Diagnosis Wound #65: Same as Pre-Procedure Wound #66 Pre-procedure diagnosis of Wound #66 is a Venous Leg Ulcer located on the Right,Lateral Lower Leg . There was a Four Layer Compression Therapy Procedure by [name], RN. Post procedure Diagnosis Wound #66: Same as Pre-Procedure Plan Follow-up Appointments: Return Appointment  in 2 weeks. Dressing Change Frequency: Other: - 2 times per week Skin Barriers/Peri-Wound Care: TCA Cream or Ointment - lightly from knee to ankle on left and right lower legs Wound Cleansing: Wound #65 Left,Circumferential Lower Leg: Clean wound with Normal Saline. Wound #66 Right,Lateral Lower Leg: Clean wound with Normal Saline. Primary Wound Dressing: Wound #65 Left,Circumferential Lower Leg: Calcium Alginate with Silver Wound #66 Right,Lateral Lower Leg: Calcium Alginate with Silver Secondary Dressing: Wound #65 Left,Circumferential Lower Leg: ABD pad Wound #66 Right,Lateral Lower Leg: ABD pad Edema Control: 4 layer compression - Bilateral Elevate legs to the level of the heart or above for  30 minutes daily and/or when sitting, a frequency of: Segmental Compressive Device. - 1 hour per day Home Health: Wound #65 Left,Circumferential Lower Leg: Continue Home Health skilled nursing for wound care. - amedysis Wound #66 Right,Lateral Lower Leg: [facility] skilled nursing for wound care. - amedysis 1. Continue with silver alginate to all open areas ABDs under 4-layer compression bilaterally 2. He is back using his external compression pumps as frequently as he will use them. His wife is back at home 3. I have asked him to bring in his external compression stockings next time Electronic Signature(s) Signed: 12/23/2019 6:13:52 PM By: Linton Ham MD Entered By: Linton Ham on 12/23/2019 14:18:34 -------------------------------------------------------------------------------- SuperBill Details Patient Name: Date of Service: [name] 12/23/2019 Medical Record [id number] Patient Account Number: [id number] Date of Birth/Sex: Treating RN: 1956/08/17 (63 y.o. [name]) [name] Primary Care Provider: [name] Other Clinician: Referring Provider: Treating Provider/Extender:Robson, Donnamae Jude, August Albino in Treatment: 4 Diagnosis Coding ICD-10 Codes Code Description [phone] Chronic venous hypertension (idiopathic) with ulcer and inflammation of bilateral lower extremity I89.0 Lymphedema, not elsewhere classified L97.821 Non-pressure chronic ulcer of other part of left lower leg limited to breakdown of skin L97.811 Non-pressure chronic ulcer of other part of right lower leg limited to breakdown of skin Facility Procedures CPT4: Code [id number] Description: [id number] BILATERAL: Application of multi-layer venous compression system; leg (below knee), including ankle and foot. Modifier Quantity: 1 Physician Procedures CPT4: Description Modifier Quantity Code [id number] - WC PHYS LEVEL 3 - EST PT 1 ICD-10 Diagnosis Description I87.333 Chronic venous  hypertension (idiopathic) with ulcer and inflammation of bilateral lower extremity I89.0 Lymphedema, not elsewhere  classified L97.821 Non-pressure chronic ulcer of other part of left lower leg limited to breakdown of skin Electronic Signature(s) Signed: 12/23/2019 6:13:52 PM By: Linton Ham MD Entered By: Linton Ham on 12/23/2019 14:18:55

## 2019-12-26 DIAGNOSIS — L97219 Non-pressure chronic ulcer of right calf with unspecified severity: Secondary | ICD-10-CM | POA: Diagnosis not present

## 2019-12-26 DIAGNOSIS — I5032 Chronic diastolic (congestive) heart failure: Secondary | ICD-10-CM | POA: Diagnosis not present

## 2019-12-26 DIAGNOSIS — L97828 Non-pressure chronic ulcer of other part of left lower leg with other specified severity: Secondary | ICD-10-CM | POA: Diagnosis not present

## 2019-12-26 DIAGNOSIS — G4733 Obstructive sleep apnea (adult) (pediatric): Secondary | ICD-10-CM | POA: Diagnosis not present

## 2019-12-26 DIAGNOSIS — I110 Hypertensive heart disease with heart failure: Secondary | ICD-10-CM | POA: Diagnosis not present

## 2019-12-26 DIAGNOSIS — I872 Venous insufficiency (chronic) (peripheral): Secondary | ICD-10-CM | POA: Diagnosis not present

## 2019-12-29 DIAGNOSIS — Z8616 Personal history of COVID-19: Secondary | ICD-10-CM | POA: Diagnosis not present

## 2019-12-29 DIAGNOSIS — G4733 Obstructive sleep apnea (adult) (pediatric): Secondary | ICD-10-CM | POA: Diagnosis not present

## 2019-12-29 DIAGNOSIS — I110 Hypertensive heart disease with heart failure: Secondary | ICD-10-CM | POA: Diagnosis not present

## 2019-12-29 DIAGNOSIS — I872 Venous insufficiency (chronic) (peripheral): Secondary | ICD-10-CM | POA: Diagnosis not present

## 2019-12-29 DIAGNOSIS — L97219 Non-pressure chronic ulcer of right calf with unspecified severity: Secondary | ICD-10-CM | POA: Diagnosis not present

## 2019-12-29 DIAGNOSIS — E669 Obesity, unspecified: Secondary | ICD-10-CM | POA: Diagnosis not present

## 2019-12-29 DIAGNOSIS — L97828 Non-pressure chronic ulcer of other part of left lower leg with other specified severity: Secondary | ICD-10-CM | POA: Diagnosis not present

## 2019-12-29 DIAGNOSIS — I5032 Chronic diastolic (congestive) heart failure: Secondary | ICD-10-CM | POA: Diagnosis not present

## 2019-12-29 NOTE — Progress Notes (Signed)
[name] [name] ([id number]) Visit Report for 12/23/2019 Arrival Information Details Patient Name: Date of Service: [name] [name], [name] [name] 12/23/2019 12:30 PM Medical Record [id number] Patient Account Number: [id number] Date of Birth/Sex: Treating RN: 08-Jan-1956 (64 y.o. [name] Primary Care Jmarion Christiano: [name] Other Clinician: Referring Hakan Nudelman: Treating Donell Tomkins/Extender:Robson, Donnamae Jude, August Albino in Treatment: 4 Visit Information History Since Last Visit Added or deleted any medications: No Patient Arrived: [name] [name] Any new allergies or adverse reactions: No Arrival Time: 13:02 Had a fall or experienced change in No Accompanied By: alone activities of daily living that may affect Transfer Assistance: None risk of falls: Patient Identification Verified: Yes Signs or symptoms of abuse/neglect since last No Secondary Verification Process Completed: Yes visito Patient Requires Transmission-Based No Hospitalized since last visit: No Precautions: Implantable device outside of the clinic excluding No Patient Has Alerts: Yes cellular tissue based products placed in the center Patient Alerts: R ABI = since last visit: 1.25 Has Dressing in Place as Prescribed: Yes L ABI = Has Compression in Place as Prescribed: Yes 1.27 Pain Present Now: No Electronic Signature(s) Signed: 12/29/2019 6:12:10 PM By: Levan Hurst RN, BSN Entered By: Levan Hurst on 12/23/2019 13:02:34 -------------------------------------------------------------------------------- Compression Therapy Details Patient Name: Date of Service: [name] [name]. 12/23/2019 12:30 PM Medical Record [id number] Patient Account Number: [id number] Date of Birth/Sex: Treating RN: [birth year] (63 y.o. [name] Primary Care Terique Kawabata: [name] Other Clinician: Referring Race Latour: Treating Laraine Samet/Extender:Robson, Donnamae Jude, August Albino in Treatment: 4 Compression Therapy Performed  for Wound Wound #65 Left,Circumferential Lower Leg Assessment: Performed By: Clinician [name], RN Compression Type: Four Layer Post Procedure Diagnosis Same as Pre-procedure Electronic Signature(s) Signed: 12/23/2019 6:43:44 PM By: [name] RN Entered By: [name] on 12/23/2019 13:59:06 -------------------------------------------------------------------------------- Compression Therapy Details Patient Name: Date of Service: [name] [name], [name] [name] 12/23/2019 12:30 PM Medical Record [id number] Patient Account Number: [id number] Date of Birth/Sex: Treating RN: 1955/11/11 (63 y.o. [name] Primary Care [name]: [name] Other Clinician: Referring Brendt Dible: Treating Sonu Kruckenberg/Extender:Robson, Donnamae Jude, August Albino in Treatment: 4 Compression Therapy Performed for Wound Wound #66 Right,Lateral Lower Leg Assessment: Performed By: Clinician [name], RN Compression Type: Four Layer Post Procedure Diagnosis Same as Pre-procedure Electronic Signature(s) Signed: 12/23/2019 6:43:44 PM By: [name] RN Entered By: [name] on 12/23/2019 13:59:06 -------------------------------------------------------------------------------- Encounter Discharge Information Details Patient Name: Date of Service: [name] [name]. 12/23/2019 12:30 PM Medical Record [id number] Patient Account Number: [id number] Date of Birth/Sex: Treating RN: [birth year] (64 y.o. [name] [name] Primary Care Eirik Schueler: [name] Other Clinician: Referring Jennalee Greaves: Treating Kamdin Follett/Extender:Robson, Donnamae Jude, August Albino in Treatment: 4 Encounter Discharge Information Items Discharge Condition: Stable Ambulatory Status: Cane Discharge Destination: Home Transportation: Private Auto Accompanied By: self Schedule Follow-up Appointment: Yes Clinical Summary of Care: Patient Declined Electronic Signature(s) Signed: 12/23/2019 6:07:04 PM By: Kela Millin Entered By:  Kela Millin on 12/23/2019 14:22:53 -------------------------------------------------------------------------------- Lower Extremity Assessment Details Patient Name: Date of Service: [name] [name], [name] [name] 12/23/2019 12:30 PM Medical Record [id number] Patient Account Number: [id number] Date of Birth/Sex: Treating RN: November 27, 1955 (64 y.o. [name] Primary Care Key Cen: [name] Other Clinician: Referring Veena Sturgess: Treating Detrell Umscheid/Extender:Robson, Donnamae Jude, August Albino in Treatment: 4 Edema Assessment Assessed: [Left: No] [Right: No] Edema: [Left: Yes] [Right: Yes] Calf Left: Right: Point of Measurement: 30 cm From Medial Instep 43 cm 43.2 cm Ankle Left: Right: Point of Measurement: 13 cm From Medial Instep 25.2 cm 28 cm Vascular Assessment Pulses: Dorsalis Pedis Palpable: [Left:Yes] [Right:Yes] Electronic Signature(s) Signed: 12/29/2019 6:12:10  PM By: Levan Hurst RN, BSN Entered By: Levan Hurst on 12/23/2019 13:13:17 -------------------------------------------------------------------------------- Multi Wound Chart Details Patient Name: Date of Service: [name] [name]. 12/23/2019 12:30 PM Medical Record [id number] Patient Account Number: [id number] Date of Birth/Sex: Treating RN: 08-Jan-1956 (63 y.o. [name]) [name] Primary Care Brooksie Ellwanger: [name] Other Clinician: Referring Jantzen Pilger: Treating Shavonna Corella/Extender:Robson, Donnamae Jude, August Albino in Treatment: 4 Vital Signs Height(in): 70 Pulse(bpm): 61 Weight(lbs): 360 Blood Pressure(mmHg): 160/77 Body Mass Index(BMI): 52 Temperature(F): 97.5 Respiratory 20 Rate(breaths/min): Photos: [65:No Photos] [66:No Photos] [N/A:N/A] Wound Location: [65:Left, Circumferential Lower Right, Lateral Lower Leg N/A Leg] Wounding Event: [65:Gradually Appeared] [66:Gradually Appeared] [N/A:N/A] Primary Etiology: [65:Venous Leg Ulcer] [66:Venous Leg Ulcer] [N/A:N/A] Secondary Etiology:  [65:Lymphedema] [66:Lymphedema] [N/A:N/A] Comorbid History: [65:Lymphedema, Sleep Apnea, Lymphedema, Sleep Apnea, N/A Congestive Heart Failure, Congestive Heart Failure, Hypertension, Peripheral Hypertension, Peripheral Venous Disease, Osteoarthritis] [66:Venous Disease, Osteoarthritis] Date Acquired: [65:09/08/2019] [66:09/08/2019] [N/A:N/A] Weeks of Treatment: [65:4] [66:4] [N/A:N/A] Wound Status: [65:Open] [66:Open] [N/A:N/A] Clustered Wound: [65:Yes] [66:Yes] [N/A:N/A] Clustered Quantity: [65:4] [66:0] [N/A:N/A] Measurements L x W x D 14.5x38x0.1 [66:0.5x0.5x0.1] [N/A:N/A] (cm) Area (cm) : [id number] [N/A:N/A] Volume (cm) : [id number] [66:0.02] [N/A:N/A] % Reduction in Area: [65:29.60%] [66:100.00%] [N/A:N/A] % Reduction in Volume: 29.60% [66:100.00%] [N/A:N/A] Classification: [65:Full Thickness Without Exposed Support Structures Exposed Support Structures] [66:Full Thickness Without] [N/A:N/A] Exudate Amount: [65:Medium] [66:Medium] [N/A:N/A] Exudate Type: [65:Serosanguineous] [66:Serosanguineous] [N/A:N/A] Exudate Color: [65:red, brown] [66:red, brown] [N/A:N/A] Wound Margin: [65:Flat and Intact] [66:Flat and Intact] [N/A:N/A] Granulation Amount: [65:Large (67-100%)] [66:Large (67-100%)] [N/A:N/A] Granulation Quality: [65:Red] [66:Red] [N/A:N/A] Necrotic Amount: [65:None Present (0%)] [66:None Present (0%)] [N/A:N/A] Exposed Structures: [65:Fat Layer (Subcutaneous Fat Layer (Subcutaneous N/A Tissue) Exposed: Yes Fascia: No Tendon: No Muscle: No Joint: No Bone: No] [66:Tissue) Exposed: Yes Fascia: No Tendon: No Muscle: No Joint: No Bone: No] Epithelialization: [65:Small (1-33%)] [66:Large (67-100%) Compression Therapy] [N/A:N/A N/A] Treatment Notes Electronic Signature(s) Signed: 12/23/2019 6:13:52 PM By: Linton Ham MD Signed: 12/23/2019 6:43:44 PM By: [name] RN Entered By: Linton Ham on 12/23/2019  14:11:39 -------------------------------------------------------------------------------- Multi-Disciplinary Care Plan Details Patient Name: Date of Service: [name] [name]. 12/23/2019 12:30 PM Medical Record [id number] Patient Account Number: [id number] Date of Birth/Sex: Treating RN: 12/01/1955 (63 y.o. [name] Primary Care Arshan Jabs: [name] Other Clinician: Referring Harmon Bommarito: Treating Vicci Reder/Extender:Robson, Donnamae Jude, August Albino in Treatment: 4 Active Inactive Wound/Skin Impairment Nursing Diagnoses: Knowledge deficit related to ulceration/compromised skin integrity Goals: Patient/caregiver will verbalize understanding of skin care regimen Date Initiated: 11/25/2019 Target Resolution Date: 12/26/2019 Goal Status: Active Ulcer/skin breakdown will have a volume reduction of 30% by week 4 Date Initiated: 11/25/2019 Target Resolution Date: 12/26/2019 Goal Status: Active Interventions: Assess patient/caregiver ability to obtain necessary supplies Assess patient/caregiver ability to perform ulcer/skin care regimen upon admission and as needed Assess ulceration(s) every visit Notes: Electronic Signature(s) Signed: 12/23/2019 6:43:44 PM By: [name] RN Entered By: [name] on 12/23/2019 13:55:47 -------------------------------------------------------------------------------- Pain Assessment Details Patient Name: Date of Service: [name] [name], [name] [name] 12/23/2019 12:30 PM Medical Record [id number] Patient Account Number: [id number] Date of Birth/Sex: Treating RN: 04-24-1956 (64 y.o. [name] Primary Care Corley Maffeo: [name] Other Clinician: Referring Marcel Sorter: Treating Ayvah Caroll/Extender:Robson, Donnamae Jude, August Albino in Treatment: 4 Active Problems Location of Pain Severity and Description of Pain Patient Has Paino No Site Locations Pain Management and Medication Current Pain Management: Electronic Signature(s) Signed: 12/29/2019  6:12:10 PM By: Levan Hurst RN, BSN Entered By: Levan Hurst on 12/23/2019 13:02:59 -------------------------------------------------------------------------------- Patient/Caregiver Education Details Patient Name: Date of Service: [name] [name]. 4/6/2021andnbsp12:30 PM  Medical Record [id number] Patient Account Number: [id number] Date of Birth/Gender: Treating RN: Jul 26, 1956 (63 y.o. [name]) [name] Primary Care Physician: [name] Other Clinician: Referring Physician: Treating Physician/Extender:Robson, Donnamae Jude, August Albino in Treatment: 4 Education Assessment Education Provided To: Patient Education Topics Provided Wound/Skin Impairment: Methods: Explain/Verbal Responses: State content correctly Electronic Signature(s) Signed: 12/23/2019 6:43:44 PM By: [name] RN Entered By: [name] on 12/23/2019 13:57:14 -------------------------------------------------------------------------------- Wound Assessment Details Patient Name: Date of Service: [name] [name], [name] [name] 12/23/2019 12:30 PM Medical Record [id number] Patient Account Number: [id number] Date of Birth/Sex: Treating RN: 24-Jul-1956 (64 y.o. [name] Primary Care Taitum Alms: [name] Other Clinician: Referring Jessey Stehlin: Treating Analiah Drum/Extender:Robson, Donnamae Jude, August Albino in Treatment: 4 Wound Status Wound Number: 65 Primary Venous Leg Ulcer Etiology: Wound Location: Left, Circumferential Lower Leg Secondary Lymphedema Wounding Event: Gradually Appeared Etiology: Date Acquired: 09/08/2019 Wound Open Weeks Of Treatment: 4 Status: Clustered Wound: Yes Comorbid Lymphedema, Sleep Apnea, Congestive History: Heart Failure, Hypertension, Peripheral Venous Disease, Osteoarthritis Wound Measurements Length: (cm) 14.5 % Reduct Width: (cm) 38 % Reduct Depth: (cm) 0.1 Epitheli Clustered Quantity: 4 Tunnelin Area: (cm) 432.754 Undermi Volume: (cm) 43.275 Wound  Description Full Thickness Without Exposed Support Classification: Structures Wound Flat and Intact Margin: Exudate Medium Amount: Exudate Serosanguineous Type: Exudate red, brown Color: Wound Bed Granulation Amount: Large (67-100%) Granulation Quality: Red Necrotic Amount: None Present (0%) Foul Odor After Cleansing: No Slough/Fibrino No Exposed Structure Fascia Exposed: No Fat Layer (Subcutaneous Tissue) Exposed: Yes Tendon Exposed: No Muscle Exposed: No Joint Exposed: No Bone Exposed: No ion in Area: 29.6% ion in Volume: 29.6% alization: Small (1-33%) g: No ning: No Treatment Notes Wound #65 (Left, Circumferential Lower Leg) 1. Cleanse With Wound Cleanser Soap and water 2. Periwound Care TCA Cream 3. Primary Dressing Applied Calcium Alginate Ag 4. Secondary Dressing ABD Pad 6. Support Layer Applied 4 layer compression Water quality scientist) Signed: 12/29/2019 6:12:10 PM By: Levan Hurst RN, BSN Entered By: Levan Hurst on 12/23/2019 13:14:33 -------------------------------------------------------------------------------- Wound Assessment Details Patient Name: Date of Service: [name] [name]. 12/23/2019 12:30 PM Medical Record [id number] Patient Account Number: [id number] Date of Birth/Sex: Treating RN: 11-11-1955 (64 y.o. [name] Primary Care Annais Crafts: [name] Other Clinician: Referring Koty Anctil: Treating Kamy Poinsett/Extender:Robson, Donnamae Jude, August Albino in Treatment: 4 Wound Status Wound Number: 66 Primary Venous Leg Ulcer Etiology: Wound Location: Right, Lateral Lower Leg Secondary Lymphedema Wounding Event: Gradually Appeared Etiology: Date Acquired: 09/08/2019 Wound Open Weeks Of Treatment: 4 Status: Clustered Wound: Yes Comorbid Lymphedema, Sleep Apnea, Congestive History: Heart Failure, Hypertension, Peripheral Venous Disease, Osteoarthritis Wound Measurements Length: (cm) 0.5 % Reduct Width: (cm) 0.5  % Reduct Depth: (cm) 0.1 Epitheli Clustered Quantity: 0 Tunnelin Area: (cm) 0.196 Undermi Volume: (cm) 0.02 Wound Description Classification: Full Thickness Without Exposed Support Foul Od Structures Slough/ Wound Flat and Intact Margin: Exudate Medium Amount: Exudate Serosanguineous Type: Exudate red, brown Color: Wound Bed Granulation Amount: Large (67-100%) Granulation Quality: Red Fascia Expo Necrotic Amount: None Present (0%) Fat Layer ( Tendon Expo Muscle Expo Joint Expos Bone Expose or After Cleansing: No Fibrino No Exposed Structure sed: No Subcutaneous Tissue) Exposed: Yes sed: No sed: No ed: No d: No ion in Area: 100% ion in Volume: 100% alization: Large (67-100%) g: No ning: No Treatment Notes Wound #66 (Right, Lateral Lower Leg) 1. Cleanse With Wound Cleanser Soap and water 2. Periwound Care TCA Cream 3. Primary Dressing Applied Calcium Alginate Ag 4. Secondary Dressing ABD Pad 6. Support Layer Applied 4 layer compression wrap  Electronic Signature(s) Signed: 12/29/2019 6:12:10 PM By: Levan Hurst RN, BSN Entered By: Levan Hurst on 12/23/2019 13:14:48 -------------------------------------------------------------------------------- Vitals Details Patient Name: Date of Service: [name] [name]. 12/23/2019 12:30 PM Medical Record [id number] Patient Account Number: [id number] Date of Birth/Sex: Treating RN: 08-May-1956 (64 y.o. [name] Primary Care Dhruv Christina: [name] Other Clinician: Referring Rameen Gohlke: Treating Circe Chilton/Extender:Robson, Donnamae Jude, August Albino in Treatment: 4 Vital Signs Time Taken: 13:02 Temperature (F): 97.5 Height (in): 70 Pulse (bpm): 61 Weight (lbs): 360 Respiratory Rate (breaths/min): 20 Body Mass Index (BMI): 51.6 Blood Pressure (mmHg): 160/77 Reference Range: 80 - 120 mg / dl Electronic Signature(s) Signed: 12/29/2019 6:12:10 PM By: Levan Hurst RN, BSN Entered By: Levan Hurst on 12/23/2019 13:02:54

## 2019-12-30 DIAGNOSIS — L97828 Non-pressure chronic ulcer of other part of left lower leg with other specified severity: Secondary | ICD-10-CM | POA: Diagnosis not present

## 2019-12-30 DIAGNOSIS — G4733 Obstructive sleep apnea (adult) (pediatric): Secondary | ICD-10-CM | POA: Diagnosis not present

## 2019-12-30 DIAGNOSIS — I110 Hypertensive heart disease with heart failure: Secondary | ICD-10-CM | POA: Diagnosis not present

## 2019-12-30 DIAGNOSIS — I5032 Chronic diastolic (congestive) heart failure: Secondary | ICD-10-CM | POA: Diagnosis not present

## 2019-12-30 DIAGNOSIS — L97219 Non-pressure chronic ulcer of right calf with unspecified severity: Secondary | ICD-10-CM | POA: Diagnosis not present

## 2019-12-30 DIAGNOSIS — I872 Venous insufficiency (chronic) (peripheral): Secondary | ICD-10-CM | POA: Diagnosis not present

## 2020-01-02 DIAGNOSIS — I5032 Chronic diastolic (congestive) heart failure: Secondary | ICD-10-CM | POA: Diagnosis not present

## 2020-01-02 DIAGNOSIS — G4733 Obstructive sleep apnea (adult) (pediatric): Secondary | ICD-10-CM | POA: Diagnosis not present

## 2020-01-02 DIAGNOSIS — L97828 Non-pressure chronic ulcer of other part of left lower leg with other specified severity: Secondary | ICD-10-CM | POA: Diagnosis not present

## 2020-01-02 DIAGNOSIS — L97219 Non-pressure chronic ulcer of right calf with unspecified severity: Secondary | ICD-10-CM | POA: Diagnosis not present

## 2020-01-02 DIAGNOSIS — I110 Hypertensive heart disease with heart failure: Secondary | ICD-10-CM | POA: Diagnosis not present

## 2020-01-02 DIAGNOSIS — I872 Venous insufficiency (chronic) (peripheral): Secondary | ICD-10-CM | POA: Diagnosis not present

## 2020-01-05 ENCOUNTER — Telehealth: Payer: Self-pay | Admitting: Cardiology

## 2020-01-05 NOTE — Telephone Encounter (Signed)
FYI.  °Contacted patient regarding recall appointment, patient notified our office they did not wish to keep this appointment at this time.  Deleted recall from system. °

## 2020-01-06 ENCOUNTER — Encounter (HOSPITAL_BASED_OUTPATIENT_CLINIC_OR_DEPARTMENT_OTHER): Payer: Medicare Other | Admitting: Internal Medicine

## 2020-01-06 ENCOUNTER — Other Ambulatory Visit: Payer: Self-pay

## 2020-01-06 DIAGNOSIS — L97811 Non-pressure chronic ulcer of other part of right lower leg limited to breakdown of skin: Secondary | ICD-10-CM | POA: Diagnosis not present

## 2020-01-06 DIAGNOSIS — I110 Hypertensive heart disease with heart failure: Secondary | ICD-10-CM | POA: Diagnosis not present

## 2020-01-06 DIAGNOSIS — L97821 Non-pressure chronic ulcer of other part of left lower leg limited to breakdown of skin: Secondary | ICD-10-CM | POA: Diagnosis not present

## 2020-01-06 DIAGNOSIS — L97812 Non-pressure chronic ulcer of other part of right lower leg with fat layer exposed: Secondary | ICD-10-CM | POA: Diagnosis not present

## 2020-01-06 DIAGNOSIS — I890 Lymphedema, not elsewhere classified: Secondary | ICD-10-CM | POA: Diagnosis not present

## 2020-01-06 DIAGNOSIS — L97822 Non-pressure chronic ulcer of other part of left lower leg with fat layer exposed: Secondary | ICD-10-CM | POA: Diagnosis not present

## 2020-01-06 DIAGNOSIS — Z8616 Personal history of COVID-19: Secondary | ICD-10-CM | POA: Diagnosis not present

## 2020-01-06 DIAGNOSIS — I87333 Chronic venous hypertension (idiopathic) with ulcer and inflammation of bilateral lower extremity: Secondary | ICD-10-CM | POA: Diagnosis not present

## 2020-01-06 NOTE — Progress Notes (Signed)
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 arrives with green drainage under the compression right greater than left. He is not complaining of any pain. He also almost circumferential epithelial loss on the right. He has home health changing the dressing. We are doing 2-week follow-ups. He lives in [place] 10/23; 2-week follow-up. The patient took his antibiotics he seems to have tolerated this well. He has still  areas on the right and left calf left more substantially. I think he has some improvement in the epithelialization. He has new wounds on the left dorsal foot today. He says he has been using compression pumps 11/6; two-week follow-up. The patient arrived still with wounds mostly on his lateral lower legs. He has a new area on the right dorsal foot today just in close proximity to his toes. His edema control is marginal. He will not use his compression pumps. He has home health changing the dressings. He tells [place] that his wife is in the hospital in [place] with heart failure. I suspect he is sitting at her bedside for most the day. His legs are probably dependent. 12/4; 1 month follow-up. He arrives with everything on his legs completely closed. He has some form of external compression garment at home as well as compression pumps. READMISSION 11/25/2019 We discharge this patient on 08/22/2019 with everything on his bilateral lower legs closed. He had an external compression stocking for both legs at home as well as compression pumps although admittedly he has never been compliant with the latter. He states his legs stay closed for about 2 or 3 weeks. He ended up in hospital at [facility] from 12/22 through 12/29 with Covid infection. He  came home and is gradually been regaining his strength. Currently has bilateral innumerable wounds almost circumferentially on both lower legs in the setting of severe venous inflammation but no current infection. The patient has diastolic heart failure hypertension history of TIA chronic venous ulcers had obstructive sleep apnea although he is not compliant with CPAP. He was never felt to have an arterial issue in this clinic his pedal pulses and ABIs have been normal most recently in September/20 at 1.25 on the right and 1.27 on the left 3/23; he arrives with much less edema in both legs. He has home health changing the dressings. Been using  silver alginate. He only has an open area along the wrap line of both legs. The rest of this seems to be closed. 4/6; better edema control in our compression wraps. He has not been using his external compression pumps he tells me because his wife was hospitalized for about 10 days. We have been using silver alginate on the wounds. 4/20; he has good edema control and compression wraps. His wife is back at [facility] he says he has not been using the external compression pumps. Silver alginate to the wounds Objective Constitutional Patient is hypertensive.. Pulse regular and within target range for patient.Marland Kitchen Respirations regular, non-labored and within target range.. Temperature is normal and within the target range for the patient.Marland Kitchen Appears in no distress. Vitals Time Taken: 1:08 AM, Height: 70 in, Weight: 360 lbs, BMI: 51.6, Temperature: 97.7 F, Pulse: 58 bpm, Respiratory Rate: 22 breaths/min, Blood Pressure: 151/88 mmHg. Respiratory work of breathing is normal. Cardiovascular No evidence of CHF. Pedal pulses are palpable. Good edema control.. General Notes: Wound exam; small wounds on both lower legs. Everything looks considerably better. His edema control is quite good Integumentary (Hair, Skin) Wound #65 status is Open. Original cause of wound was Gradually Appeared. The wound is located on the Left,Circumferential Lower Leg. The wound measures 0.7cm length x 0.7cm width x 0.1cm depth; 0.385cm^2 area and 0.038cm^3 volume. There is Fat Layer (Subcutaneous Tissue) Exposed exposed. There is no tunneling or undermining noted. There is a small amount of serous drainage noted. The wound margin is flat and intact. There is large (67-100%) red granulation within the wound bed. There is no necrotic tissue within the wound bed. Wound #66 status is Open. Original cause of wound was Gradually Appeared. The wound is located on the Right,Lateral Lower Leg. The wound measures 0.3cm length x 0.3cm width x  0.1cm depth; 0.071cm^2 area and 0.007cm^3 volume. There is Fat Layer (Subcutaneous Tissue) Exposed exposed. There is no tunneling or undermining noted. There is a small amount of serous drainage noted. The wound margin is flat and intact. There is large (67-100%) red granulation within the wound bed. There is no necrotic tissue within the wound bed. 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 There was a Four Layer Compression Therapy Procedure by [name], RN. Post procedure Diagnosis Wound #65: Same as Pre-Procedure Wound #66 Pre-procedure diagnosis of Wound #66 is a Venous Leg Ulcer located on the Right,Lateral Lower Leg . There was a Four Layer Compression Therapy Procedure by [name], RN.  Post procedure Diagnosis Wound #66: Same as Pre-Procedure Plan Follow-up Appointments: Return Appointment in 2 weeks. Dressing Change Frequency: Other: - 2 times per week Skin Barriers/Peri-Wound Care: TCA Cream or Ointment - lightly from knee to ankle on left and right lower legs Wound Cleansing: Wound #65 Left,Circumferential Lower Leg: Clean wound with Normal Saline. Wound #66 Right,Lateral Lower Leg: Clean wound with Normal Saline. Primary Wound Dressing: Wound #65 Left,Circumferential Lower Leg: Calcium Alginate with Silver Wound #66 Right,Lateral Lower Leg: Calcium Alginate with Silver Secondary Dressing: Wound #65 Left,Circumferential Lower Leg: ABD pad Wound #66 Right,Lateral Lower Leg: ABD pad Edema Control: 4 layer compression -  Bilateral Elevate legs to the level of the heart or above for 30 minutes daily and/or when sitting, a frequency of: Segmental Compressive Device. - 1 hour per day Home Health: Wound #65 Left,Circumferential Lower Leg: Continue Home Health skilled nursing for wound care. - amedysis Wound #66 Right,Lateral Lower Leg: [facility] skilled nursing for wound care. - amedysis 1. Silver alginate to the small open areas that are still remaining 2. Still under 4-layer compression bilaterally 3. I am doubtful he is using his external compression pumps now that his wife is ill again. 4. He has home health changing the dressings. 5. I have asked him to bring his compression stockings next in 2 weeks Electronic Signature(s) Signed: 01/06/2020 5:47:38 PM By: Linton Ham MD Entered By: Linton Ham on 01/06/2020 13:58:38 -------------------------------------------------------------------------------- SuperBill Details Patient Name: Date of Service: [name] [name] 01/06/2020 Medical Record [id number] Patient Account Number: [id number] Date of Birth/Sex: Treating RN: [birth year] (63 y.o. [name] [name]) [name] Primary Care Provider: [name] Other Clinician: Referring Provider: Treating Provider/Extender:Daneesha Quinteros, Donnamae Jude, August Albino in Treatment: 6 Diagnosis Coding ICD-10 Codes Code Description [phone] Chronic venous hypertension (idiopathic) with ulcer and inflammation of bilateral lower extremity I89.0 Lymphedema, not elsewhere classified L97.821 Non-pressure chronic ulcer of other part of left lower leg limited to breakdown of skin L97.811 Non-pressure chronic ulcer of other part of right lower leg limited to breakdown of skin Facility Procedures CPT4: Code [id number] Description: [id number] BILATERAL: Application of multi-layer venous compression system; leg (below knee), including ankle and foot. Modifier Quantity: 1 Physician Procedures CPT4: Code [id number] Description: [id number] - WC PHYS LEVEL 3 - EST PT ICD-10 Diagnosis Description I87.333 Chronic venous hypertension (idiopathic) with ulcer and in lower extremity L97.821 Non-pressure chronic ulcer of other part of left lower leg skin L97.811 Non-pressure  chronic ulcer of other part of right lower le skin Modifier: flammation of bil limited to break g limited to brea Quantity: 1 ateral down of kdown of Electronic Signature(s) Signed: 01/06/2020 5:47:38 PM By: Linton Ham MD Entered By: Linton Ham on 01/06/2020 13:59:00

## 2020-01-06 NOTE — Progress Notes (Signed)
TRYGVE, THAL ([id number]) Visit Report for 01/06/2020 Arrival Information Details Patient Name: Date of Service: [name], [name] 01/06/2020 12:30 PM Medical Record [id number] Patient Account Number: [id number] Date of Birth/Sex: Treating RN: 1956-08-22 (64 y.o. [name] [name] Primary Care Cathy Crounse: [name] Other Clinician: Referring Veron Senner: Treating Evelynne Spiers/Extender:Robson, Donnamae Jude, August Albino in Treatment: 6 Visit Information History Since Last Visit Added or deleted any medications: No Patient Arrived: [name] [name] Any new allergies or adverse reactions: No Arrival Time: 13:02 Had a fall or experienced change in No Accompanied By: self activities of daily living that may affect Transfer Assistance: None risk of falls: Patient Identification Verified: Yes Signs or symptoms of abuse/neglect since last No Secondary Verification Process Completed: Yes visito Patient Requires Transmission-Based No Hospitalized since last visit: No Precautions: Implantable device outside of the clinic excluding No Patient Has Alerts: Yes cellular tissue based products placed in the center Patient Alerts: R ABI = since last visit: 1.25 Has Dressing in Place as Prescribed: Yes L ABI = Has Compression in Place as Prescribed: Yes 1.27 Pain Present Now: No Electronic Signature(s) Signed: 01/06/2020 6:13:22 PM By: [name] RN, BSN Entered By: [name] on 01/06/2020 13:02:45 -------------------------------------------------------------------------------- Compression Therapy Details Patient Name: Date of Service: [name] [name] 01/06/2020 12:30 PM Medical Record [id number] Patient Account Number: [id number] Date of Birth/Sex: Treating RN: 25-Dec-1955 (64 y.o. [name] Primary Care Posie Lillibridge: [name] Other Clinician: Referring Symphany Fleissner: Treating Joanny Dupree/Extender:Robson, Donnamae Jude, August Albino in Treatment: 6 Compression Therapy  Performed for Wound Wound #65 Left,Circumferential Lower Leg Assessment: Performed By: Clinician [name], RN Compression Type: Four Layer Post Procedure Diagnosis Same as Pre-procedure Electronic Signature(s) Signed: 01/06/2020 5:39:56 PM By: [name] RN Entered By: [name] on 01/06/2020 13:40:19 -------------------------------------------------------------------------------- Compression Therapy Details Patient Name: Date of Service: [name] [name], [name] [name] 01/06/2020 12:30 PM Medical Record [id number] Patient Account Number: [id number] Date of Birth/Sex: Treating RN: [birth year] (64 y.o. [name] Primary Care Delani Kohli: [name] Other Clinician: Referring Laquinta Hazell: Treating Philipp Callegari/Extender:Robson, Donnamae Jude, August Albino in Treatment: 6 Compression Therapy Performed for Wound Wound #66 Right,Lateral Lower Leg Assessment: Performed By: Clinician [name], RN Compression Type: Four Layer Post Procedure Diagnosis Same as Pre-procedure Electronic Signature(s) Signed: 01/06/2020 5:39:56 PM By: [name] RN Entered By: [name] on 01/06/2020 13:40:19 -------------------------------------------------------------------------------- Encounter Discharge Information Details Patient Name: Date of Service: [name] [name]. 01/06/2020 12:30 PM Medical Record [id number] Patient Account Number: [id number] Date of Birth/Sex: Treating RN: 1956/03/15 (64 y.o. [name] Primary Care Chrystopher Stangl: [name] Other Clinician: Referring Lauralye Kinn: Treating Loralei Radcliffe/Extender:Robson, Donnamae Jude, August Albino in Treatment: 6 Encounter Discharge Information Items Discharge Condition: Stable Ambulatory Status: Cane Discharge Destination: Home Transportation: Private Auto Accompanied By: self Schedule Follow-up Appointment: Yes Clinical Summary of Care: Patient Declined Electronic Signature(s) Signed: 01/06/2020 5:57:12 PM By: Kela Millin Entered By: Kela Millin on 01/06/2020 14:05:03 -------------------------------------------------------------------------------- Lower Extremity Assessment Details Patient Name: Date of Service: [name] [name], [name] [name] 01/06/2020 12:30 PM Medical Record [id number] Patient Account Number: [id number] Date of Birth/Sex: Treating RN: 03/28/1956 (64 y.o. [name] [name] Primary Care Karlos Scadden: [name] Other Clinician: Referring Jenita Rayfield: Treating Senan Urey/Extender:Robson, Donnamae Jude, August Albino in Treatment: 6 Edema Assessment Assessed: [Left: No] [Right: No] Edema: [Left: Yes] [Right: Yes] Calf Left: Right: Point of Measurement: 30 cm From Medial Instep 41 cm 42 cm Ankle Left: Right: Point of Measurement: 13 cm From Medial Instep 26 cm 27.7 cm Vascular Assessment Pulses: Dorsalis Pedis Palpable: [Left:Yes] [Right:Yes] Electronic Signature(s) Signed: 01/06/2020 6:13:22  PM By: [name] RN, BSN Entered By: [name] on 01/06/2020 13:18:02 -------------------------------------------------------------------------------- Multi Wound Chart Details Patient Name: Date of Service: [name] [name]. 01/06/2020 12:30 PM Medical Record [id number] Patient Account Number: [id number] Date of Birth/Sex: Treating RN: 16-Oct-1955 (64 y.o. [name]) [name] Primary Care Eilidh Marcano: [name] Other Clinician: Referring Tarron Krolak: Treating Analiyah Lechuga/Extender:Robson, Donnamae Jude, August Albino in Treatment: 6 Vital Signs Height(in): 70 Pulse(bpm): 58 Weight(lbs): 360 Blood Pressure(mmHg): 151/88 Body Mass Index(BMI): 52 Temperature(F): 97.7 Respiratory 22 Rate(breaths/min): Photos: [65:No Photos] [66:No Photos] [N/A:N/A] Wound Location: [65:Left, Circumferential Lower Right, Lateral Lower Leg N/A Leg] Wounding Event: [65:Gradually Appeared] [66:Gradually Appeared] [N/A:N/A] Primary Etiology: [65:Venous Leg Ulcer] [66:Venous Leg Ulcer]  [N/A:N/A] Secondary Etiology: [65:Lymphedema] [66:Lymphedema] [N/A:N/A] Comorbid History: [65:Lymphedema, Sleep Apnea, Lymphedema, Sleep Apnea, N/A Congestive Heart Failure, Congestive Heart Failure, Hypertension, Peripheral Hypertension, Peripheral Venous Disease, Osteoarthritis] [66:Venous Disease, Osteoarthritis] Date Acquired: [65:09/08/2019] [66:09/08/2019] [N/A:N/A] Weeks of Treatment: [65:6] [66:6] [N/A:N/A] Wound Status: [65:Open] [66:Open] [N/A:N/A] Clustered Wound: [65:Yes] [66:Yes] [N/A:N/A] Clustered Quantity: [65:4] [66:0] [N/A:N/A] Measurements L x W x D 0.7x0.7x0.1 [66:0.3x0.3x0.1] [N/A:N/A] (cm) Area (cm) : [65:0.385] [66:0.071] [N/A:N/A] Volume (cm) : [65:0.038] [74:9.449] [N/A:N/A] % Reduction in Area: [65:99.90%] [66:100.00%] [N/A:N/A] % Reduction in Volume: 99.90% [66:100.00%] [N/A:N/A] Classification: [65:Full Thickness Without Exposed Support Structures Exposed Support Structures] [66:Full Thickness Without] [N/A:N/A] Exudate Amount: [65:Small] [66:Small] [N/A:N/A] Exudate Type: [65:Serous] [66:Serous] [N/A:N/A] Exudate Color: [65:amber] [66:amber] [N/A:N/A] Wound Margin: [65:Flat and Intact] [66:Flat and Intact] [N/A:N/A] Granulation Amount: [65:Large (67-100%)] [66:Large (67-100%)] [N/A:N/A] Granulation Quality: [65:Red] [66:Red] [N/A:N/A] Necrotic Amount: [65:None Present (0%)] [66:None Present (0%)] [N/A:N/A] Exposed Structures: [65:Fat Layer (Subcutaneous Fat Layer (Subcutaneous N/A Tissue) Exposed: Yes Fascia: No Tendon: No Muscle: No Joint: No Bone: No] [66:Tissue) Exposed: Yes Fascia: No Tendon: No Muscle: No Joint: No Bone: No] Epithelialization: [65:Small (1-33%)] [66:Large (67-100%) Compression Therapy] [N/A:N/A N/A] Treatment Notes Electronic Signature(s) Signed: 01/06/2020 5:39:56 PM By: [name] RN Signed: 01/06/2020 5:47:38 PM By: Linton Ham MD Entered By: Linton Ham on 01/06/2020  13:54:13 -------------------------------------------------------------------------------- Multi-Disciplinary Care Plan Details Patient Name: Date of Service: [name] [name]. 01/06/2020 12:30 PM Medical Record [id number] Patient Account Number: [id number] Date of Birth/Sex: Treating RN: [birth year] (63 y.o. [name] Primary Care Prezley Qadir: [name] Other Clinician: Referring Bryn Saline: Treating Khaylee Mcevoy/Extender:Robson, Donnamae Jude, August Albino in Treatment: 6 Active Inactive Wound/Skin Impairment Nursing Diagnoses: Knowledge deficit related to ulceration/compromised skin integrity Goals: Patient/caregiver will verbalize understanding of skin care regimen Date Initiated: 11/25/2019 Target Resolution Date: 01/23/2020 Goal Status: Active Ulcer/skin breakdown will have a volume reduction of 30% by week 4 Date Initiated: 11/25/2019 Date Inactivated: 01/06/2020 Target Resolution Date: 12/26/2019 Goal Status: Met Ulcer/skin breakdown will have a volume reduction of 50% by week 8 Date Initiated: 01/06/2020 Target Resolution Date: 02/05/2020 Goal Status: Active Interventions: Assess patient/caregiver ability to obtain necessary supplies Assess patient/caregiver ability to perform ulcer/skin care regimen upon admission and as needed Assess ulceration(s) every visit Notes: Electronic Signature(s) Signed: 01/06/2020 5:39:56 PM By: [name] RN Entered By: [name] on 01/06/2020 13:39:19 -------------------------------------------------------------------------------- Pain Assessment Details Patient Name: Date of Service: [name] [name], [name] [name] 01/06/2020 12:30 PM Medical Record [id number] Patient Account Number: [id number] Date of Birth/Sex: Treating RN: [birth year] (64 y.o. [name] [name] Primary Care Malissie Musgrave: [name] Other Clinician: Referring Denina Rieger: Treating Estephani Popper/Extender:Robson, Donnamae Jude, August Albino in Treatment: 6 Active Problems Location  of Pain Severity and Description of Pain Patient Has Paino No Site Locations Rate the pain. Current Pain Level: 0 Pain Management and Medication Current Pain Management: Electronic Signature(s)  Signed: 01/06/2020 6:13:22 PM By: [name] RN, BSN Entered By: [name] on 01/06/2020 13:11:19 -------------------------------------------------------------------------------- Patient/Caregiver Education Details Patient Name: Date of Service: [name] [name] 4/20/2021andnbsp12:30 PM Medical Record [id number] Patient Account Number: [id number] Date of Birth/Gender: Treating RN: 07-11-1956 (63 y.o. [name] Primary Care Physician: [name] Other Clinician: Referring Physician: Treating Physician/Extender:Robson, Donnamae Jude, August Albino in Treatment: 6 Education Assessment Education Provided To: Patient Education Topics Provided Wound/Skin Impairment: Methods: Explain/Verbal Responses: State content correctly Electronic Signature(s) Signed: 01/06/2020 5:39:56 PM By: [name] RN Entered By: [name] on 01/06/2020 13:21:28 -------------------------------------------------------------------------------- Wound Assessment Details Patient Name: Date of Service: [name] [name], [name] [name] 01/06/2020 12:30 PM Medical Record [id number] Patient Account Number: [id number] Date of Birth/Sex: Treating RN: August 11, 1956 (64 y.o. [name] [name] Primary Care Vielka Klinedinst: [name] Other Clinician: Referring Osamah Schmader: Treating Dodie Parisi/Extender:Robson, Donnamae Jude, August Albino in Treatment: 6 Wound Status Wound Number: 65 Primary Venous Leg Ulcer Etiology: Wound Location: Left, Circumferential Lower Leg Secondary Lymphedema Wounding Event: Gradually Appeared Etiology: Date Acquired: 09/08/2019 Wound Open Weeks Of Treatment: 6 Status: Clustered Wound: Yes Comorbid Lymphedema, Sleep Apnea, Congestive History: Heart Failure, Hypertension,  Peripheral Venous Disease, Osteoarthritis Wound Measurements Length: (cm) 0.7 % Reduc Width: (cm) 0.7 % Reduc Depth: (cm) 0.1 Epithel Clustered Quantity: 4 Tunneli Area: (cm) 0.385 Underm Volume: (cm) 0.038 Wound Description Full Thickness Without Exposed Support Classification: Structures Wound Flat and Intact Margin: Exudate Small Amount: Exudate Serous Type: Exudate amber Color: Wound Bed Granulation Amount: Large (67-100%) Granulation Quality: Red Necrotic Amount: None Present (0%) Foul Odor After Cleansing: No Slough/Fibrino No Exposed Structure Fascia Exposed: No Fat Layer (Subcutaneous Tissue) Exposed: Yes Tendon Exposed: No Muscle Exposed: No Joint Exposed: No Bone Exposed: No tion in Area: 99.9% tion in Volume: 99.9% ialization: Small (1-33%) ng: No ining: No Treatment Notes Wound #65 (Left, Circumferential Lower Leg) 1. Cleanse With Wound Cleanser Soap and water 2. Periwound Care TCA Cream 3. Primary Dressing Applied Calcium Alginate Ag 4. Secondary Dressing ABD Pad 6. Support Layer Applied 4 layer compression Water quality scientist) Signed: 01/06/2020 6:13:22 PM By: [name] RN, BSN Entered By: [name] on 01/06/2020 13:23:25 -------------------------------------------------------------------------------- Wound Assessment Details Patient Name: Date of Service: [name] [name], [name] [name] 01/06/2020 12:30 PM Medical Record [id number] Patient Account Number: [id number] Date of Birth/Sex: Treating RN: 1956/05/03 (64 y.o. [name] [name] Primary Care Anjana Cheek: [name] Other Clinician: Referring Theda Payer: Treating Khristy Kalan/Extender:Robson, Donnamae Jude, August Albino in Treatment: 6 Wound Status Wound Number: 66 Primary Venous Leg Ulcer Etiology: Wound Location: Right, Lateral Lower Leg Secondary Lymphedema Wounding Event: Gradually Appeared Etiology: Date Acquired: 09/08/2019 Wound Open Weeks Of Treatment:  6 Status: Clustered Wound: Yes Comorbid Lymphedema, Sleep Apnea, Congestive History: Heart Failure, Hypertension, Peripheral Venous Disease, Osteoarthritis Wound Measurements Length: (cm) 0.3 % Reduc Width: (cm) 0.3 % Reduc Depth: (cm) 0.1 Epithel Clustered Quantity: 0 Tunneli Area: (cm) 0.071 Underm Volume: (cm) 0.007 Wound Description Classification: Full Thickness Without Exposed Support Foul Od Structures Slough/ Wound Flat and Intact Margin: Exudate Small Amount: Exudate Serous Type: Exudate amber Color: Wound Bed Granulation Amount: Large (67-100%) Granulation Quality: Red Fascia Exp Necrotic Amount: None Present (0%) Fat Layer Tendon Exp Muscle Exp Joint Expo Bone Expos or After Cleansing: No Fibrino No Exposed Structure osed: No (Subcutaneous Tissue) Exposed: Yes osed: No osed: No sed: No ed: No tion in Area: 100% tion in Volume: 100% ialization: Large (67-100%) ng: No ining: No Treatment Notes Wound #66 (Right, Lateral Lower Leg) 1. Cleanse With Wound Cleanser Soap  and water 2. Periwound Care TCA Cream 3. Primary Dressing Applied Calcium Alginate Ag 4. Secondary Dressing ABD Pad 6. Support Layer Applied 4 layer compression Water quality scientist) Signed: 01/06/2020 6:13:22 PM By: [name] RN, BSN Entered By: [name] on 01/06/2020 13:23:58 -------------------------------------------------------------------------------- [place] Details Patient Name: Date of Service: [name] [name]. 01/06/2020 12:30 PM Medical Record [id number] Patient Account Number: [id number] Date of Birth/Sex: Treating RN: 1956-09-12 (64 y.o. [name] [name] Primary Care Gurshaan Matsuoka: [name] Other Clinician: Referring Adain Geurin: Treating Isiaah Cuervo/Extender:Robson, Donnamae Jude, August Albino in Treatment: 6 Vital Signs Time Taken: 01:08 Temperature (F): 97.7 Height (in): 70 Pulse (bpm): 58 Weight (lbs): 360 Respiratory Rate  (breaths/min): 22 Body Mass Index (BMI): 51.6 Blood Pressure (mmHg): 151/88 Reference Range: 80 - 120 mg / dl Electronic Signature(s) Signed: 01/06/2020 6:13:22 PM By: [name] RN, BSN Entered By: [name] on 01/06/2020 13:10:59

## 2020-01-07 DIAGNOSIS — I872 Venous insufficiency (chronic) (peripheral): Secondary | ICD-10-CM | POA: Diagnosis not present

## 2020-01-07 DIAGNOSIS — I5032 Chronic diastolic (congestive) heart failure: Secondary | ICD-10-CM | POA: Diagnosis not present

## 2020-01-07 DIAGNOSIS — G4733 Obstructive sleep apnea (adult) (pediatric): Secondary | ICD-10-CM | POA: Diagnosis not present

## 2020-01-07 DIAGNOSIS — L97219 Non-pressure chronic ulcer of right calf with unspecified severity: Secondary | ICD-10-CM | POA: Diagnosis not present

## 2020-01-07 DIAGNOSIS — I110 Hypertensive heart disease with heart failure: Secondary | ICD-10-CM | POA: Diagnosis not present

## 2020-01-07 DIAGNOSIS — L97828 Non-pressure chronic ulcer of other part of left lower leg with other specified severity: Secondary | ICD-10-CM | POA: Diagnosis not present

## 2020-01-09 DIAGNOSIS — L97828 Non-pressure chronic ulcer of other part of left lower leg with other specified severity: Secondary | ICD-10-CM | POA: Diagnosis not present

## 2020-01-09 DIAGNOSIS — I110 Hypertensive heart disease with heart failure: Secondary | ICD-10-CM | POA: Diagnosis not present

## 2020-01-09 DIAGNOSIS — G4733 Obstructive sleep apnea (adult) (pediatric): Secondary | ICD-10-CM | POA: Diagnosis not present

## 2020-01-09 DIAGNOSIS — I872 Venous insufficiency (chronic) (peripheral): Secondary | ICD-10-CM | POA: Diagnosis not present

## 2020-01-09 DIAGNOSIS — L97219 Non-pressure chronic ulcer of right calf with unspecified severity: Secondary | ICD-10-CM | POA: Diagnosis not present

## 2020-01-09 DIAGNOSIS — I5032 Chronic diastolic (congestive) heart failure: Secondary | ICD-10-CM | POA: Diagnosis not present

## 2020-01-14 DIAGNOSIS — G4733 Obstructive sleep apnea (adult) (pediatric): Secondary | ICD-10-CM | POA: Diagnosis not present

## 2020-01-14 DIAGNOSIS — I110 Hypertensive heart disease with heart failure: Secondary | ICD-10-CM | POA: Diagnosis not present

## 2020-01-14 DIAGNOSIS — I872 Venous insufficiency (chronic) (peripheral): Secondary | ICD-10-CM | POA: Diagnosis not present

## 2020-01-14 DIAGNOSIS — I5032 Chronic diastolic (congestive) heart failure: Secondary | ICD-10-CM | POA: Diagnosis not present

## 2020-01-14 DIAGNOSIS — L97219 Non-pressure chronic ulcer of right calf with unspecified severity: Secondary | ICD-10-CM | POA: Diagnosis not present

## 2020-01-14 DIAGNOSIS — L97828 Non-pressure chronic ulcer of other part of left lower leg with other specified severity: Secondary | ICD-10-CM | POA: Diagnosis not present

## 2020-01-16 DIAGNOSIS — L97828 Non-pressure chronic ulcer of other part of left lower leg with other specified severity: Secondary | ICD-10-CM | POA: Diagnosis not present

## 2020-01-16 DIAGNOSIS — I110 Hypertensive heart disease with heart failure: Secondary | ICD-10-CM | POA: Diagnosis not present

## 2020-01-16 DIAGNOSIS — L97219 Non-pressure chronic ulcer of right calf with unspecified severity: Secondary | ICD-10-CM | POA: Diagnosis not present

## 2020-01-16 DIAGNOSIS — I10 Essential (primary) hypertension: Secondary | ICD-10-CM | POA: Diagnosis not present

## 2020-01-16 DIAGNOSIS — I5032 Chronic diastolic (congestive) heart failure: Secondary | ICD-10-CM | POA: Diagnosis not present

## 2020-01-16 DIAGNOSIS — G4733 Obstructive sleep apnea (adult) (pediatric): Secondary | ICD-10-CM | POA: Diagnosis not present

## 2020-01-16 DIAGNOSIS — I872 Venous insufficiency (chronic) (peripheral): Secondary | ICD-10-CM | POA: Diagnosis not present

## 2020-01-16 DIAGNOSIS — E7801 Familial hypercholesterolemia: Secondary | ICD-10-CM | POA: Diagnosis not present

## 2020-01-18 DIAGNOSIS — Z8616 Personal history of COVID-19: Secondary | ICD-10-CM | POA: Diagnosis not present

## 2020-01-18 DIAGNOSIS — Z8673 Personal history of transient ischemic attack (TIA), and cerebral infarction without residual deficits: Secondary | ICD-10-CM | POA: Diagnosis not present

## 2020-01-18 DIAGNOSIS — I110 Hypertensive heart disease with heart failure: Secondary | ICD-10-CM | POA: Diagnosis not present

## 2020-01-18 DIAGNOSIS — I872 Venous insufficiency (chronic) (peripheral): Secondary | ICD-10-CM | POA: Diagnosis not present

## 2020-01-18 DIAGNOSIS — E669 Obesity, unspecified: Secondary | ICD-10-CM | POA: Diagnosis not present

## 2020-01-18 DIAGNOSIS — L97822 Non-pressure chronic ulcer of other part of left lower leg with fat layer exposed: Secondary | ICD-10-CM | POA: Diagnosis not present

## 2020-01-18 DIAGNOSIS — I5032 Chronic diastolic (congestive) heart failure: Secondary | ICD-10-CM | POA: Diagnosis not present

## 2020-01-18 DIAGNOSIS — G4733 Obstructive sleep apnea (adult) (pediatric): Secondary | ICD-10-CM | POA: Diagnosis not present

## 2020-01-18 DIAGNOSIS — Z9981 Dependence on supplemental oxygen: Secondary | ICD-10-CM | POA: Diagnosis not present

## 2020-01-20 ENCOUNTER — Encounter (HOSPITAL_BASED_OUTPATIENT_CLINIC_OR_DEPARTMENT_OTHER): Payer: Medicare Other | Attending: Internal Medicine | Admitting: Internal Medicine

## 2020-01-20 DIAGNOSIS — Z9119 Patient's noncompliance with other medical treatment and regimen: Secondary | ICD-10-CM | POA: Diagnosis not present

## 2020-01-20 DIAGNOSIS — I110 Hypertensive heart disease with heart failure: Secondary | ICD-10-CM | POA: Insufficient documentation

## 2020-01-20 DIAGNOSIS — I87333 Chronic venous hypertension (idiopathic) with ulcer and inflammation of bilateral lower extremity: Secondary | ICD-10-CM | POA: Insufficient documentation

## 2020-01-20 DIAGNOSIS — G4733 Obstructive sleep apnea (adult) (pediatric): Secondary | ICD-10-CM | POA: Diagnosis not present

## 2020-01-20 DIAGNOSIS — Z6841 Body Mass Index (BMI) 40.0 and over, adult: Secondary | ICD-10-CM | POA: Diagnosis not present

## 2020-01-20 DIAGNOSIS — I509 Heart failure, unspecified: Secondary | ICD-10-CM | POA: Insufficient documentation

## 2020-01-20 DIAGNOSIS — I890 Lymphedema, not elsewhere classified: Secondary | ICD-10-CM | POA: Diagnosis not present

## 2020-01-20 DIAGNOSIS — Z8673 Personal history of transient ischemic attack (TIA), and cerebral infarction without residual deficits: Secondary | ICD-10-CM | POA: Diagnosis not present

## 2020-01-20 DIAGNOSIS — L97811 Non-pressure chronic ulcer of other part of right lower leg limited to breakdown of skin: Secondary | ICD-10-CM | POA: Diagnosis not present

## 2020-01-20 DIAGNOSIS — L97821 Non-pressure chronic ulcer of other part of left lower leg limited to breakdown of skin: Secondary | ICD-10-CM | POA: Insufficient documentation

## 2020-01-20 DIAGNOSIS — M1990 Unspecified osteoarthritis, unspecified site: Secondary | ICD-10-CM | POA: Diagnosis not present

## 2020-01-21 NOTE — Progress Notes (Signed)
[name] [name] ([id number]) , Visit Report for 01/20/2020 Arrival Information Details Patient Name: Date of Service: [name] Ro RDIN W. 01/20/2020 12:45 PM Medical Record Number: [id number] Patient Account Number: [id number] Date of Birth/Sex: Treating RN: 1956-01-16 (65 y.o. [name] Primary Care Shanta Hartner: [name] Other Clinician: Referring Amarise Lillo: Treating Kinsly Hild/Extender: [name] in Treatment: 8 Visit Information History Since Last Visit Added or deleted any medications: No Patient Arrived: [name]: No Arrival Time: 12:51 Had a fall or experienced change in No Accompanied By: self activities of daily living that may affect Transfer Assistance: None risk of falls: Patient Identification Verified: Yes Signs or symptoms of abuse/neglect since last visito No Secondary Verification Process Completed: Yes Hospitalized since last visit: No Patient Requires Transmission-Based Precautions: No Implantable device outside of the clinic excluding No Patient Has Alerts: Yes cellular tissue based products placed in the center Patient Alerts: R ABI = 1.25 since last visit: L ABI = 1.27 Has Dressing in Place as Prescribed: Yes Has Compression in Place as Prescribed: Yes Pain Present Now: No Electronic Signature(s) Signed: 01/20/2020 5:30:50 PM By: [name] RN, BSN Entered By: [name] on 01/20/2020 13:00:23 -------------------------------------------------------------------------------- Compression Therapy Details Patient Name: Date of Service: [name] Smiling, [place] W. 01/20/2020 12:45 PM Medical Record Number: [id number] Patient Account Number: [id number] Date of Birth/Sex: Treating RN: 1956/06/06 (63 y.o. [name] Primary Care Avontae Burkhead: [name] Other Clinician: Referring Faizaan Falls: Treating Corrine Tillis/Extender: [name] in Treatment: 8 Compression Therapy  Performed for Wound Assessment: Wound #65 Left,Circumferential Lower Leg Performed By: Clinician [name], RN Compression Type: Four Layer Post Procedure Diagnosis Same as Pre-procedure Electronic Signature(s) Signed: 01/21/2020 4:37:47 PM By: [name] RN Entered By: [name] on 01/20/2020 13:18:52 -------------------------------------------------------------------------------- Compression Therapy Details Patient Name: Date of Service: [name] Ro RDIN W. 01/20/2020 12:45 PM Medical Record Number: [id number] Patient Account Number: [id number] Date of Birth/Sex: Treating RN: 1956-04-14 (63 y.o. [name] Primary Care Rivky Clendenning: [name] Other Clinician: Referring Awilda Covin: Treating Venetia Prewitt/Extender: [name] in Treatment: 8 Compression Therapy Performed for Wound Assessment: Wound #66 Right,Lateral Lower Leg Performed By: Clinician [name], RN Compression Type: Four Layer Post Procedure Diagnosis Same as Pre-procedure Electronic Signature(s) Signed: 01/21/2020 4:37:47 PM By: [name] RN Entered By: [name] on 01/20/2020 13:18:52 -------------------------------------------------------------------------------- Encounter Discharge Information Details Patient Name: Date of Service: [name] Smiling, [name] 01/20/2020 12:45 PM Medical Record Number: [id number] Patient Account Number: [id number] Date of Birth/Sex: Treating RN: 1956/08/01 (64 y.o. [name] Primary Care Ilisha Blust: [name] Other Clinician: Referring Ples Trudel: Treating Ahleah Simko/Extender: [name] in Treatment: 8 Encounter Discharge Information Items Discharge Condition: Stable Ambulatory Status: Cane Discharge Destination: Home Transportation: Private Auto Accompanied By: self Schedule Follow-up Appointment: Yes Clinical Summary of Care: Patient Declined Electronic Signature(s) Signed: 01/20/2020 5:21:30 PM By: Kela Millin Entered By: Kela Millin on 01/20/2020 15:02:28 -------------------------------------------------------------------------------- Lower Extremity Assessment Details Patient Name: Date of Service: [name] Ro RDIN W. 01/20/2020 12:45 PM Medical Record Number: [id number] Patient Account Number: [id number] Date of Birth/Sex: Treating RN: 08/25/1956 (64 y.o. [name] Primary Care Tahj Lindseth: [name] Other Clinician: Referring Kamrin Spath: Treating Fouad Taul/Extender: [name] in Treatment: 8 Edema Assessment Assessed: [name]: No] [name]: No] Edema: [Left: Yes] [Right: Yes] Calf Left: Right: Point of Measurement: 30 cm From Medial Instep 41.5 cm 41.3 cm Ankle Left: Right: Point of Measurement: 13  cm From Medial Instep 25.4 cm 27.5 cm Vascular Assessment Pulses: Dorsalis Pedis Palpable: [Left:Yes] [Right:Yes] Electronic Signature(s) Signed: 01/20/2020 5:30:50 PM By: [name] RN, BSN Entered By: [name] on 01/20/2020 13:08:35 -------------------------------------------------------------------------------- Multi Wound Chart Details Patient Name: Date of Service: [name] Smiling, [place] W. 01/20/2020 12:45 PM Medical Record Number: [id number] Patient Account Number: [id number] Date of Birth/Sex: Treating RN: August 23, 1956 (63 y.o. [name] [name]) [name] Primary Care Keylen Uzelac: [name] Other Clinician: Referring Demetre Monaco: Treating Keena Dinse/Extender: [name] in Treatment: 8 Vital Signs Height(in): 70 Pulse(bpm): 56 Weight(lbs): 360 Blood Pressure(mmHg): 138/68 Body Mass Index(BMI): 52 Temperature(F): 98 Respiratory Rate(breaths/min): 22 Photos: [65:No Photos Left, Circumferential Lower Leg] [66:No Photos Right, Lateral Lower Leg] [N/A:N/A N/A] Wound Location: [65:Gradually Appeared] [66:Gradually Appeared] [N/A:N/A] Wounding Event: [65:Venous Leg Ulcer] [66:Venous Leg Ulcer] [N/A:N/A] Primary Etiology:  [65:Lymphedema] [66:Lymphedema] [N/A:N/A] Secondary Etiology: [65:Lymphedema, Sleep Apnea,] [66:Lymphedema, Sleep Apnea,] [N/A:N/A] Comorbid History: [65:Congestive Heart Failure, Hypertension, Peripheral Venous Disease, Osteoarthritis 09/08/2019] [66:Congestive Heart Failure, Hypertension, Peripheral Venous Disease, Osteoarthritis 09/08/2019] [N/A:N/A] Date Acquired: [65:8] [66:8] [N/A:N/A] Weeks of Treatment: [65:Open] [66:Open] [N/A:N/A] Wound Status: [65:Yes] [66:Yes] [N/A:N/A] Clustered Wound: [65:4] [66:0] [N/A:N/A] Clustered Quantity: [65:2.4x1.7x0.1] [66:0x0x0] [N/A:N/A] Measurements L x W x D (cm) [55:9.741] [66:0] [N/A:N/A] A (cm) : rea [65:0.32] [66:0] [N/A:N/A] Volume (cm) : [65:99.50%] [66:100.00%] [N/A:N/A] % Reduction in Area: [65:99.50%] [66:100.00%] [N/A:N/A] % Reduction in Volume: [65:Full Thickness Without Exposed] [66:Full Thickness Without Exposed] [N/A:N/A] Classification: [65:Support Structures Small] [66:Support Structures None Present] [N/A:N/A] Exudate Amount: [65:Serous] [66:N/A] [N/A:N/A] Exudate Type: [65:amber] [66:N/A] [N/A:N/A] Exudate Color: [65:Flat and Intact] [66:Flat and Intact] [N/A:N/A] Wound Margin: [65:Large (67-100%)] [66:None Present (0%)] [N/A:N/A] Granulation Amount: [65:Red] [66:N/A] [N/A:N/A] Granulation Quality: [65:None Present (0%)] [66:None Present (0%)] [N/A:N/A] Necrotic Amount: [65:Fascia: No] [66:Fascia: No] [N/A:N/A] Exposed Structures: [65:Fat Layer (Subcutaneous Tissue) Exposed: No Tendon: No Muscle: No Joint: No Bone: No Limited to Skin Breakdown Large (67-100%)] [66:Fat Layer (Subcutaneous Tissue) Exposed: No Tendon: No Muscle: No Joint: No Bone: No Large (67-100%)] [N/A:N/A] Epithelialization: [65:Compression Therapy] [66:Compression Therapy] [N/A:N/A] Treatment Notes Electronic Signature(s) Signed: 01/20/2020 5:25:34 PM By: Linton Ham MD Signed: 01/21/2020 4:37:47 PM By: [name] RN Entered By: Linton Ham on  01/20/2020 13:26:20 -------------------------------------------------------------------------------- Multi-Disciplinary Care Plan Details Patient Name: Date of Service: [name] Smiling, HA [place] W. 01/20/2020 12:45 PM Medical Record Number: [id number] Patient Account Number: [id number] Date of Birth/Sex: Treating RN: 1956/07/10 (63 y.o. [name] Primary Care Matina Rodier: [name] Other Clinician: Referring Vasiliy Mccarry: Treating Zella Dewan/Extender: [name] in Treatment: 8 Active Inactive Wound/Skin Impairment Nursing Diagnoses: Knowledge deficit related to ulceration/compromised skin integrity Goals: Patient/caregiver will verbalize understanding of skin care regimen Date Initiated: 11/25/2019 Target Resolution Date: 01/23/2020 Goal Status: Active Ulcer/skin breakdown will have a volume reduction of 30% by week 4 Date Initiated: 11/25/2019 Date Inactivated: 01/06/2020 Target Resolution Date: 12/26/2019 Goal Status: Met Ulcer/skin breakdown will have a volume reduction of 50% by week 8 Date Initiated: 01/06/2020 Target Resolution Date: 02/05/2020 Goal Status: Active Interventions: Assess patient/caregiver ability to obtain necessary supplies Assess patient/caregiver ability to perform ulcer/skin care regimen upon admission and as needed Assess ulceration(s) every visit Notes: Electronic Signature(s) Signed: 01/21/2020 4:37:47 PM By: [name] RN Entered By: [name] on 01/20/2020 13:18:02 -------------------------------------------------------------------------------- Pain Assessment Details Patient Name: Date of Service: [name]. 01/20/2020 12:45 PM Medical Record Number: [id number] Patient Account Number: [id number] Date of Birth/Sex: Treating RN: 1956/08/11 (64 y.o. [name] Primary Care Quita Mcgrory: [name] Other Clinician: Referring Shamia Uppal: Treating  Chou Busler/Extender: [name] in Treatment: 8 Active  Problems Location of Pain Severity and Description of Pain Patient Has Paino No Site Locations Rate the pain. Current Pain Level: 0 Pain Management and Medication Current Pain Management: Electronic Signature(s) Signed: 01/20/2020 5:30:50 PM By: [name] RN, BSN Entered By: [name] on 01/20/2020 13:02:17 -------------------------------------------------------------------------------- Patient/Caregiver Education Details Patient Name: Date of Service: [name] 5/4/2021andnbsp12:45 PM Medical Record Number: [id number] Patient Account Number: [id number] Date of Birth/Gender: Treating RN: 05-08-1956 (63 y.o. [name] [name]) [name] Primary Care Physician: [name] Other Clinician: Referring Physician: Treating Physician/Extender: [name] in Treatment: 8 Education Assessment Education Provided To: Patient Education Topics Provided Wound/Skin Impairment: Methods: Explain/Verbal Responses: State content correctly Electronic Signature(s) Signed: 01/21/2020 4:37:47 PM By: [name] RN Entered By: [name] on 01/20/2020 13:18:19 -------------------------------------------------------------------------------- Wound Assessment Details Patient Name: Date of Service: [name] 01/20/2020 12:45 PM Medical Record Number: [id number] Patient Account Number: [id number] Date of Birth/Sex: Treating RN: [birth year] (64 y.o. [name] Primary Care Horrace Hanak: [name] Other Clinician: Referring Jozalyn Baglio: Treating Vencent Hauschild/Extender: [name] in Treatment: 8 Wound Status Wound Number: 65 Primary Venous Leg Ulcer Etiology: Wound Location: Left, Circumferential Lower Leg Secondary Lymphedema Wounding Event: Gradually Appeared Etiology: Date Acquired: 09/08/2019 Wound Open Weeks Of Treatment: 8 Status: Clustered Wound: Yes Comorbid Lymphedema, Sleep Apnea, Congestive Heart Failure, History:  Hypertension, Peripheral Venous Disease, Osteoarthritis Photos Photo Uploaded By: Mikeal Hawthorne on 01/21/2020 09:59:23 Wound Measurements Length: (cm) 2.4 Width: (cm) 1.7 Depth: (cm) 0.1 Clustered Quantity: 4 Area: (cm) 3. Volume: (cm) 0. % Reduction in Area: 99.5% % Reduction in Volume: 99.5% Epithelialization: Large (67-100%) Tunneling: No 204 Undermining: No 32 Wound Description Classification: Full Thickness Without Exposed Support Structures Wound Margin: Flat and Intact Exudate Amount: Small Exudate Type: Serous Exudate Color: amber Foul Odor After Cleansing: No Slough/Fibrino No Wound Bed Granulation Amount: Large (67-100%) Exposed Structure Granulation Quality: Red Fascia Exposed: No Necrotic Amount: None Present (0%) Fat Layer (Subcutaneous Tissue) Exposed: No Tendon Exposed: No Muscle Exposed: No Joint Exposed: No Bone Exposed: No Limited to Skin Breakdown Treatment Notes Wound #65 (Left, Circumferential Lower Leg) 1. Cleanse With Wound Cleanser Soap and water 2. Periwound Care Moisturizing lotion TCA Cream 3. Primary Dressing Applied Calcium Alginate Ag 4. Secondary Dressing ABD Pad 6. Support Layer Applied 4 layer compression Water quality scientist) Signed: 01/20/2020 5:30:50 PM By: [name] RN, BSN Entered By: [name] on 01/20/2020 13:11:26 -------------------------------------------------------------------------------- Wound Assessment Details Patient Name: Date of Service: [name] Ro RDIN W. 01/20/2020 12:45 PM Medical Record Number: [id number] Patient Account Number: [id number] Date of Birth/Sex: Treating RN: 1956-06-15 (64 y.o. [name] Primary Care Zoie Sarin: [name] Other Clinician: Referring Sri Clegg: Treating Ahmya Bernick/Extender: [name] in Treatment: 8 Wound Status Wound Number: 66 Primary Venous Leg Ulcer Etiology: Wound Location: Right, Lateral Lower Leg Secondary  Lymphedema Wounding Event: Gradually Appeared Etiology: Date Acquired: 09/08/2019 Wound Open Weeks Of Treatment: 8 Status: Clustered Wound: Yes Comorbid Lymphedema, Sleep Apnea, Congestive Heart Failure, History: Hypertension, Peripheral Venous Disease, Osteoarthritis Photos Photo Uploaded By: Mikeal Hawthorne on 01/21/2020 09:59:24 Wound Measurements Length: (cm) Width: (cm) Depth: (cm) Clustered Quantity: Area: (cm) Volume: (cm) 0 % Reduction in Area: 100% 0 % Reduction in Volume: 100% 0 Epithelialization: Large (67-100%) 0 Tunneling: No 0 Undermining: No 0 Wound Description Classification: Full Thickness Without Exposed Support Structures Wound Margin: Flat and Intact Exudate Amount: None Present  Foul Odor After Cleansing: No Slough/Fibrino No Wound Bed Granulation Amount: None Present (0%) Exposed Structure Necrotic Amount: None Present (0%) Fascia Exposed: No Fat Layer (Subcutaneous Tissue) Exposed: No Tendon Exposed: No Muscle Exposed: No Joint Exposed: No Bone Exposed: No Treatment Notes Wound #66 (Right, Lateral Lower Leg) 1. Cleanse With Wound Cleanser Soap and water 2. Periwound Care Moisturizing lotion TCA Cream 3. Primary Dressing Applied Calcium Alginate Ag 4. Secondary Dressing ABD Pad 6. Support Layer Applied 4 layer compression Water quality scientist) Signed: 01/20/2020 5:30:50 PM By: [name] RN, BSN Entered By: [name] on 01/20/2020 13:12:08 -------------------------------------------------------------------------------- [place] Details Patient Name: Date of Service: [name] Smiling, [name] 01/20/2020 12:45 PM Medical Record Number: [id number] Patient Account Number: [id number] Date of Birth/Sex: Treating RN: [birth year] (64 y.o. [name] Primary Care Mercia Dowe: [name] Other Clinician: Referring Juliann Olesky: Treating Sariah Henkin/Extender: [name] in Treatment: 8 Vital Signs Time  Taken: 13:00 Temperature (F): 98 Height (in): 70 Pulse (bpm): 56 Source: Stated Respiratory Rate (breaths/min): 22 Weight (lbs): 360 Blood Pressure (mmHg): 138/68 Source: Stated Reference Range: 80 - 120 mg / dl Body Mass Index (BMI): 51.6 Electronic Signature(s) Signed: 01/20/2020 5:30:50 PM By: [name] RN, BSN Entered By: [name] on 01/20/2020 13:01:11

## 2020-01-21 NOTE — Progress Notes (Signed)
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 congestive heart failure, previous venous ablation, lymphedema and obstructive sleep apnea. He has compression pumps at home but he has been completely noncompliant with this by his own admission. He is not a diabetic. ABIs in our clinic were 1.27 on the right and 1.25 on the left we do not have time for this this afternoon I am and I am not comfortable 10/9; the patient arrives with green drainage under the compression  right greater than left. He is not complaining of any pain. He also almost circumferential epithelial loss on the right. He has home health changing the dressing. We are doing 2-week follow-ups. He lives in [place] 10/23; 2-week follow-up. The patient took his antibiotics he seems to have tolerated this well. He has still areas on the right and left calf left more substantially. I think he has some improvement in the epithelialization. He has new wounds on the left dorsal foot today. He says he has been using compression pumps 11/6; two-week follow-up. The patient arrived still with wounds mostly on his lateral lower legs. He has a new area on the right dorsal foot today just in close proximity to his toes. His edema control is marginal. He will not use his compression pumps. He has home health changing the dressings. He tells [place] that his wife is in the hospital in [place] with heart failure. I suspect he is sitting at her bedside for most the day. His legs are probably dependent. 12/4; 1  month follow-up. He arrives with everything on his legs completely closed. He has some form of external compression garment at home as well as compression pumps. READMISSION 11/25/2019 We discharge this patient on 08/22/2019 with everything on his bilateral lower legs closed. He had an external compression stocking for both legs at home as well as compression pumps although admittedly he has never been compliant with the latter. He states his legs stay closed for about 2 or 3 weeks. He ended up in hospital at [place] from 12/22 through 12/29 with Covid infection. He came home and is gradually been regaining his strength. Currently has bilateral innumerable wounds almost circumferentially on both lower legs in the setting of severe venous inflammation but no current infection. The patient has diastolic heart failure hypertension history of TIA chronic venous ulcers had obstructive sleep apnea although he is not  compliant with CPAP. He was never felt to have an arterial issue in this clinic his pedal pulses and ABIs have been normal most recently in September/20 at 1.25 on the right and 1.27 on the left 3/23; he arrives with much less edema in both legs. He has home health changing the dressings. Been using silver alginate. He only has an open area along the wrap line of both legs. The rest of this seems to be closed. 4/6; better edema control in our compression wraps. He has not been using his external compression pumps he tells me because his wife was hospitalized for about 10 days. We have been using silver alginate on the wounds. 4/20; he has good edema control and compression wraps. His wife is back at [facility] he says he has not been using the external compression pumps. Silver alginate to the wounds 5/4; he has good edema control bilaterally. He has not been using the compression pumps he tells [place] his wife is coming home from [place] today. He does not have any open wounds on the right leg continued large collection of small wounds on the left anterior leg extending medially into laterally nothing much posteriorly on the left. Objective Constitutional Sitting or standing Blood Pressure is within target range for patient.. Pulse regular and within target range for patient.Marland Kitchen Respirations regular, non-labored and within target range.. Temperature is normal and within the target range for the patient.Marland Kitchen Appears in no distress. Vitals Time Taken: 1:00 PM, Height: 70 in, Source: Stated, Weight: 360 lbs, Source: Stated, BMI: 51.6, Temperature: 98 F, Pulse: 56 bpm, Respiratory Rate: 22 breaths/min, Blood Pressure: 138/68 mmHg. Cardiovascular Pedal pulses are palpable. His edema control is really quite good.. General Notes: Wound exam; small wounds on the left lower anterior leg. There is nothing on the right leg. No evidence of infection Integumentary (Hair, Skin) Skin changes of chronic venous  insufficiency. Wound #65 status is Open. Original cause of wound was Gradually Appeared. The wound is located on the Left,Circumferential Lower Leg. The wound measures 2.4cm length x 1.7cm width x 0.1cm depth; 3.204cm^2 area and 0.32cm^3 volume. The wound is limited to skin breakdown. There is no tunneling or undermining noted. There is a small amount of serous drainage noted. The wound margin is flat and intact. There is large (67-100%) red granulation within the wound bed. There is no necrotic tissue within the wound bed. Wound #66 status is Open. Original cause of wound was Gradually Appeared. The wound is located on the Right,Lateral Lower Leg. The wound measures 0cm length x 0cm width x 0cm depth; 0cm^2 area and 0cm^3 volume. There is no  tunneling or undermining noted. There is a none present amount of drainage noted. The wound margin is flat and intact. There is no granulation within the wound bed. There is no necrotic tissue within the wound bed. 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 There was a Four Layer Compression Therapy Procedure by [name], RN. Post procedure Diagnosis Wound #65: Same as Pre-Procedure Wound #66 Pre-procedure diagnosis of Wound #66 is a Venous Leg Ulcer located on the Right,Lateral Lower Leg . There was a Four Layer Compression Therapy Procedure by [name], RN. Post procedure Diagnosis Wound #66: Same as Pre-Procedure Plan Follow-up Appointments: Return Appointment in 1 week. Dressing Change Frequency: Other: - 2 times  per week Skin Barriers/Peri-Wound Care: TCA Cream or Ointment - lightly from knee to ankle on left and right lower legs Wound Cleansing: Wound #65 Left,Circumferential Lower Leg: Clean wound with Normal Saline. Wound #66 Right,Lateral Lower Leg: Clean wound with Normal Saline. Primary Wound Dressing: Wound #65 Left,Circumferential Lower Leg: Calcium Alginate with Silver Wound #66 Right,Lateral Lower Leg: Calcium Alginate with Silver Secondary Dressing: Wound #65 Left,Circumferential Lower Leg: ABD pad Wound #66 Right,Lateral Lower Leg: ABD pad Edema Control: 4 layer compression - Bilateral Elevate legs to the level of the heart or above for 30 minutes daily and/or when sitting, a frequency of: Segmental Compressive Device. - 1 hour per day Home Health: Wound #65 Left,Circumferential Lower Leg: Continue Home Health skilled nursing for wound care. - amedysis Wound #66 Right,Lateral Lower Leg: [facility] skilled nursing for wound care. - amedysis 1. I continue the same dressing on the left leg 2. Elected to wrap the right leg again for 1 more week. 3. I have asked him to bring his juxta lites next week 4. He is chronically noncompliant with external compression pumps 5. Although he still has open wounds on the left leg these are small and really look like they could heal fairly quickly Electronic Signature(s) Signed: 01/20/2020 5:25:34 PM By: Linton Ham MD Entered By: Linton Ham on 01/20/2020 13:31:34 -------------------------------------------------------------------------------- SuperBill Details Patient Name: Date of Service: [name] [name], [place] W. 01/20/2020 Medical Record Number: [id number] Patient Account Number: [id number] Date of Birth/Sex: Treating RN: March 06, 1956 (63 y.o. [name] [name] Primary Care Provider: [name] Other Clinician: Referring Provider: Treating Provider/Extender: [name] in Treatment: 8 Diagnosis  Coding ICD-10 Codes Code Description [id number] Chronic venous hypertension (idiopathic) with ulcer and inflammation of bilateral lower extremity I89.0 Lymphedema, not elsewhere classified L97.821 Non-pressure chronic ulcer of other part of left lower leg limited to breakdown of skin L97.811 Non-pressure chronic ulcer of other part of right lower leg limited to breakdown of skin Facility Procedures CPT4: Code [id number] Description: 81 BILATERAL: Application of multi-layer venous compression system; leg (below knee), including ankle and t. Modifier: Quantity: 1 Physician Procedures : CPT4 Code Description Modifier [id number] - WC PHYS LEVEL 3 - EST PT ICD-10 Diagnosis Description I87.333 Chronic venous hypertension (idiopathic) with ulcer and inflammation of bilateral lower extremity I89.0 Lymphedema, not elsewhere classified  L97.821 Non-pressure chronic ulcer of other part of left lower leg limited to breakdown  of skin L97.811 Non-pressure chronic ulcer of other part of right lower leg limited to breakdown of skin Quantity: 1 Electronic Signature(s) Signed: 01/20/2020 5:25:34 PM By: Linton Ham MD Entered By: Linton Ham on 01/20/2020 13:32:55

## 2020-01-23 DIAGNOSIS — E669 Obesity, unspecified: Secondary | ICD-10-CM | POA: Diagnosis not present

## 2020-01-23 DIAGNOSIS — I5032 Chronic diastolic (congestive) heart failure: Secondary | ICD-10-CM | POA: Diagnosis not present

## 2020-01-23 DIAGNOSIS — G4733 Obstructive sleep apnea (adult) (pediatric): Secondary | ICD-10-CM | POA: Diagnosis not present

## 2020-01-23 DIAGNOSIS — I872 Venous insufficiency (chronic) (peripheral): Secondary | ICD-10-CM | POA: Diagnosis not present

## 2020-01-23 DIAGNOSIS — I110 Hypertensive heart disease with heart failure: Secondary | ICD-10-CM | POA: Diagnosis not present

## 2020-01-23 DIAGNOSIS — L97822 Non-pressure chronic ulcer of other part of left lower leg with fat layer exposed: Secondary | ICD-10-CM | POA: Diagnosis not present

## 2020-01-27 ENCOUNTER — Encounter (HOSPITAL_BASED_OUTPATIENT_CLINIC_OR_DEPARTMENT_OTHER): Payer: Medicare Other | Admitting: Internal Medicine

## 2020-01-27 DIAGNOSIS — E669 Obesity, unspecified: Secondary | ICD-10-CM | POA: Diagnosis not present

## 2020-01-27 DIAGNOSIS — I110 Hypertensive heart disease with heart failure: Secondary | ICD-10-CM | POA: Diagnosis not present

## 2020-01-27 DIAGNOSIS — I872 Venous insufficiency (chronic) (peripheral): Secondary | ICD-10-CM | POA: Diagnosis not present

## 2020-01-27 DIAGNOSIS — L97822 Non-pressure chronic ulcer of other part of left lower leg with fat layer exposed: Secondary | ICD-10-CM | POA: Diagnosis not present

## 2020-01-27 DIAGNOSIS — I5032 Chronic diastolic (congestive) heart failure: Secondary | ICD-10-CM | POA: Diagnosis not present

## 2020-01-27 DIAGNOSIS — G4733 Obstructive sleep apnea (adult) (pediatric): Secondary | ICD-10-CM | POA: Diagnosis not present

## 2020-01-30 DIAGNOSIS — I872 Venous insufficiency (chronic) (peripheral): Secondary | ICD-10-CM | POA: Diagnosis not present

## 2020-01-30 DIAGNOSIS — E669 Obesity, unspecified: Secondary | ICD-10-CM | POA: Diagnosis not present

## 2020-01-30 DIAGNOSIS — I5032 Chronic diastolic (congestive) heart failure: Secondary | ICD-10-CM | POA: Diagnosis not present

## 2020-01-30 DIAGNOSIS — G4733 Obstructive sleep apnea (adult) (pediatric): Secondary | ICD-10-CM | POA: Diagnosis not present

## 2020-01-30 DIAGNOSIS — L97822 Non-pressure chronic ulcer of other part of left lower leg with fat layer exposed: Secondary | ICD-10-CM | POA: Diagnosis not present

## 2020-01-30 DIAGNOSIS — I110 Hypertensive heart disease with heart failure: Secondary | ICD-10-CM | POA: Diagnosis not present

## 2020-02-03 ENCOUNTER — Encounter (HOSPITAL_BASED_OUTPATIENT_CLINIC_OR_DEPARTMENT_OTHER): Payer: Medicare Other | Admitting: Internal Medicine

## 2020-02-03 ENCOUNTER — Other Ambulatory Visit: Payer: Self-pay

## 2020-02-03 DIAGNOSIS — L97811 Non-pressure chronic ulcer of other part of right lower leg limited to breakdown of skin: Secondary | ICD-10-CM | POA: Diagnosis not present

## 2020-02-03 DIAGNOSIS — Z6841 Body Mass Index (BMI) 40.0 and over, adult: Secondary | ICD-10-CM | POA: Diagnosis not present

## 2020-02-03 DIAGNOSIS — L97821 Non-pressure chronic ulcer of other part of left lower leg limited to breakdown of skin: Secondary | ICD-10-CM | POA: Diagnosis not present

## 2020-02-03 DIAGNOSIS — I890 Lymphedema, not elsewhere classified: Secondary | ICD-10-CM | POA: Diagnosis not present

## 2020-02-03 DIAGNOSIS — I87333 Chronic venous hypertension (idiopathic) with ulcer and inflammation of bilateral lower extremity: Secondary | ICD-10-CM | POA: Diagnosis not present

## 2020-02-03 NOTE — Progress Notes (Signed)
[name] [name] ([id number]) , Visit Report for 02/03/2020 Arrival Information Details Patient Name: Date of Service: [name] [name], [place] W. 02/03/2020 1:30 PM Medical Record Number: [id number] Patient Account Number: [id number] Date of Birth/Sex: Treating RN: 05-21-1956 (63 y.o. [name] [name]) [name] Primary Care Savio Albrecht: [name] Other Clinician: Referring Arlisha Patalano: Treating Rorie Delmore/Extender: [name] in Treatment: 10 Visit Information History Since Last Visit Added or deleted any medications: No Patient Arrived: [name] [name] Any new allergies or adverse reactions: No Arrival Time: 13:45 Had a fall or experienced change in No Accompanied By: self activities of daily living that may affect Transfer Assistance: None risk of falls: Patient Identification Verified: Yes Signs or symptoms of abuse/neglect since last visito No Secondary Verification Process Completed: Yes Hospitalized since last visit: No Patient Requires Transmission-Based Precautions: No Implantable device outside of the clinic excluding No Patient Has Alerts: Yes cellular tissue based products placed in the center Patient Alerts: R ABI = 1.25 since last visit: L ABI = 1.27 Has Dressing in Place as Prescribed: Yes Has Compression in Place as Prescribed: Yes Pain Present Now: No Electronic Signature(s) Signed: 02/03/2020 3:47:52 PM By: Deon Pilling Entered By: Deon Pilling on 02/03/2020 14:14:13 -------------------------------------------------------------------------------- Compression Therapy Details Patient Name: Date of Service: [name] [name], [place] W. 02/03/2020 1:30 PM Medical Record Number: [id number] Patient Account Number: [id number] Date of Birth/Sex: Treating RN: 1955-11-02 (63 y.o. [name] [name]) [name] Primary Care Sophiarose Eades: [name] Other Clinician: Referring Maelys Kinnick: Treating Bryony Kaman/Extender: [name] in Treatment: 10 Compression Therapy Performed for  Wound Assessment: Wound #65 Left,Circumferential Lower Leg Performed By: Clinician Deon Pilling, RN Compression Type: Four Layer Electronic Signature(s) Signed: 02/03/2020 3:47:52 PM By: Deon Pilling Entered By: Deon Pilling on 02/03/2020 14:16:18 -------------------------------------------------------------------------------- Compression Therapy Details Patient Name: Date of Service: [name] Ro RDIN W. 02/03/2020 1:30 PM Medical Record Number: [id number] Patient Account Number: [id number] Date of Birth/Sex: Treating RN: 1956-09-13 (63 y.o. [name] [name]) [name] Primary Care Loann Chahal: [name] Other Clinician: Referring Coy Rochford: Treating Janeal Abadi/Extender: [name] in Treatment: 10 Compression Therapy Performed for Wound Assessment: Wound #66 Right,Lateral Lower Leg Performed By: Clinician Deon Pilling, RN Compression Type: Four Layer Electronic Signature(s) Signed: 02/03/2020 3:47:52 PM By: Deon Pilling Entered By: Deon Pilling on 02/03/2020 14:16:18 -------------------------------------------------------------------------------- Encounter Discharge Information Details Patient Name: Date of Service: [name] [name], [place] W. 02/03/2020 1:30 PM Medical Record Number: [id number] Patient Account Number: [id number] Date of Birth/Sex: Treating RN: 03-11-1956 (63 y.o. [name] [name] Primary Care Christiann Hagerty: [name] Other Clinician: Referring Marquie Aderhold: Treating Willa Brocks/Extender: [name] in Treatment: 10 Encounter Discharge Information Items Discharge Condition: Stable Ambulatory Status: [place] Discharge Destination: Home Transportation: Private Auto Accompanied By: self Schedule Follow-up Appointment: Yes Clinical Summary of Care: Electronic Signature(s) Signed: 02/03/2020 3:47:52 PM By: Deon Pilling Entered By: Deon Pilling on 02/03/2020  14:17:14 -------------------------------------------------------------------------------- Patient/Caregiver Education Details Patient Name: Date of Service: [name] [name] 5/18/2021andnbsp1:30 PM Medical Record Number: [id number] Patient Account Number: [id number] Date of Birth/Gender: Treating RN: [birth year] (63 y.o. [name] [name] Primary Care Physician: [name] Other Clinician: Referring Physician: Treating Physician/Extender: [name] in Treatment: 10 Education Assessment Education Provided To: Patient Education Topics Provided Wound/Skin Impairment: Handouts: Skin Care Do's and Dont's Methods: Explain/Verbal Responses: Reinforcements needed Electronic Signature(s) Signed: 02/03/2020 3:47:52 PM By: Deon Pilling Entered By: Deon Pilling on 02/03/2020 14:17:04 -------------------------------------------------------------------------------- Wound Assessment Details Patient Name: Date of Service: [name] [name], [place] W. 02/03/2020 1:30  PM Medical Record Number: [id number] Patient Account Number: [id number] Date of Birth/Sex: Treating RN: December 01, 1955 (63 y.o. [name] [name]) [name] Primary Care Quashawn Jewkes: [name] Other Clinician: Referring Harshan Kearley: Treating Dadrian Ballantine/Extender: [name] in Treatment: 10 Wound Status Wound Number: 65 Primary Venous Leg Ulcer Etiology: Wound Location: Left, Circumferential Lower Leg Secondary Lymphedema Wounding Event: Gradually Appeared Etiology: Date Acquired: 09/08/2019 Wound Open Weeks Of Treatment: 10 Status: Clustered Wound: Yes Comorbid Lymphedema, Sleep Apnea, Congestive Heart Failure, History: Hypertension, Peripheral Venous Disease, Osteoarthritis Wound Measurements Length: (cm) 0.2 Width: (cm) 0.2 Depth: (cm) 0.1 Clustered Quantity: 1 Area: (cm) 0. Volume: (cm) 0. % Reduction in Area: 100% % Reduction in Volume: 100% Epithelialization: Large  (67-100%) Tunneling: No 031 Undermining: No 003 Wound Description Classification: Full Thickness Without Exposed Support Structures Wound Margin: Flat and Intact Exudate Amount: Small Exudate Type: Serous Exudate Color: amber Foul Odor After Cleansing: No Slough/Fibrino No Wound Bed Granulation Amount: Large (67-100%) Exposed Structure Granulation Quality: Red Fascia Exposed: No Necrotic Amount: None Present (0%) Fat Layer (Subcutaneous Tissue) Exposed: No Tendon Exposed: No Muscle Exposed: No Joint Exposed: No Bone Exposed: No Limited to Skin Breakdown Treatment Notes Wound #65 (Left, Circumferential Lower Leg) 1. Cleanse With Wound Cleanser Soap and water 2. Periwound Care Moisturizing lotion 3. Primary Dressing Applied Calcium Alginate Ag 4. Secondary Dressing ABD Pad 6. Support Layer Applied 4 layer compression wrap Notes stockinette. Electronic Signature(s) Signed: 02/03/2020 3:47:52 PM By: Deon Pilling Signed: 02/03/2020 5:13:38 PM By: [name] RN Entered By: Deon Pilling on 02/03/2020 14:15:40 -------------------------------------------------------------------------------- Wound Assessment Details Patient Name: Date of Service: [name] [name], [place] W. 02/03/2020 1:30 PM Medical Record Number: [id number] Patient Account Number: [id number] Date of Birth/Sex: Treating RN: [birth year] (63 y.o. [name] [name]) [name] Primary Care Ameah Chanda: [name] Other Clinician: Referring Mihir Flanigan: Treating Amal Saiki/Extender: [name] in Treatment: 10 Wound Status Wound Number: 66 Primary Venous Leg Ulcer Etiology: Wound Location: Right, Lateral Lower Leg Secondary Lymphedema Wounding Event: Gradually Appeared Etiology: Date Acquired: 09/08/2019 Wound Open Weeks Of Treatment: 10 Status: Clustered Wound: Yes Comorbid Lymphedema, Sleep Apnea, Congestive Heart Failure, History: Hypertension, Peripheral Venous Disease, Osteoarthritis Wound  Measurements Length: (cm) Width: (cm) Depth: (cm) Clustered Quantity: Area: (cm) Volume: (cm) 0 % Reduction in Area: 100% 0 % Reduction in Volume: 100% 0 Epithelialization: Large (67-100%) 0 Tunneling: No 0 Undermining: No 0 Wound Description Classification: Full Thickness Without Exposed Support Structures Wound Margin: Flat and Intact Exudate Amount: None Present Foul Odor After Cleansing: No Slough/Fibrino No Wound Bed Granulation Amount: None Present (0%) Exposed Structure Necrotic Amount: None Present (0%) Fascia Exposed: No Fat Layer (Subcutaneous Tissue) Exposed: No Tendon Exposed: No Muscle Exposed: No Joint Exposed: No Bone Exposed: No Treatment Notes Wound #66 (Right, Lateral Lower Leg) 1. Cleanse With Wound Cleanser Soap and water 2. Periwound Care Moisturizing lotion 3. Primary Dressing Applied Calcium Alginate Ag 4. Secondary Dressing ABD Pad 6. Support Layer Applied 4 layer compression wrap Notes stockinette. Electronic Signature(s) Signed: 02/03/2020 3:47:52 PM By: Deon Pilling Signed: 02/03/2020 5:13:38 PM By: [name] RN Entered By: Deon Pilling on 02/03/2020 14:16:01 -------------------------------------------------------------------------------- Vitals Details Patient Name: Date of Service: [name] [name], [place] W. 02/03/2020 1:30 PM Medical Record Number: [id number] Patient Account Number: [id number] Date of Birth/Sex: Treating RN: [birth year] (63 y.o. [name] [name] Primary Care Elai Vanwyk: [name] Other Clinician: Referring Delan Ksiazek: Treating Cuahutemoc Attar/Extender: [name] in Treatment: 10 Vital Signs Time Taken: 13:45 Temperature (F): 97.3 Height (in): 70  Pulse (bpm): 60 Weight (lbs): 360 Respiratory Rate (breaths/min): 22 Body Mass Index (BMI): 51.6 Blood Pressure (mmHg): 159/82 Reference Range: 80 - 120 mg / dl Electronic Signature(s) Signed: 02/03/2020 3:47:52 PM By: Deon Pilling Entered By:  Deon Pilling on 02/03/2020 14:14:36

## 2020-02-05 NOTE — Progress Notes (Signed)
[name] [name] ([id number]) , Visit Report for 02/03/2020 SuperBill Details Patient Name: Date of Service: [name] 02/03/2020 Medical Record Number: [id number] Patient Account Number: [id number] Date of Birth/Sex: Treating RN: [birth year] (63 y.o. [name]) [name] Primary Care Provider: [name] Other Clinician: Referring Provider: Treating Provider/Extender: [name] in Treatment: 10 Diagnosis Coding ICD-10 Codes Code Description [phone] Chronic venous hypertension (idiopathic) with ulcer and inflammation of bilateral lower extremity I89.0 Lymphedema, not elsewhere classified L97.821 Non-pressure chronic ulcer of other part of left lower leg limited to breakdown of skin L97.811 Non-pressure chronic ulcer of other part of right lower leg limited to breakdown of skin Facility Procedures CPT4 Description Modifier Quantity Code [id number] BILATERAL: Application of multi-layer venous compression system; leg (below knee), including ankle and 1 foot. Electronic Signature(s) Signed: 02/03/2020 3:47:52 PM By: Deon Pilling Signed: 02/05/2020 12:52:55 PM By: Linton Ham MD Entered By: Deon Pilling on 02/03/2020 14:17:23

## 2020-02-06 DIAGNOSIS — I5032 Chronic diastolic (congestive) heart failure: Secondary | ICD-10-CM | POA: Diagnosis not present

## 2020-02-06 DIAGNOSIS — I872 Venous insufficiency (chronic) (peripheral): Secondary | ICD-10-CM | POA: Diagnosis not present

## 2020-02-06 DIAGNOSIS — L97822 Non-pressure chronic ulcer of other part of left lower leg with fat layer exposed: Secondary | ICD-10-CM | POA: Diagnosis not present

## 2020-02-06 DIAGNOSIS — I110 Hypertensive heart disease with heart failure: Secondary | ICD-10-CM | POA: Diagnosis not present

## 2020-02-06 DIAGNOSIS — E669 Obesity, unspecified: Secondary | ICD-10-CM | POA: Diagnosis not present

## 2020-02-06 DIAGNOSIS — G4733 Obstructive sleep apnea (adult) (pediatric): Secondary | ICD-10-CM | POA: Diagnosis not present

## 2020-02-10 ENCOUNTER — Encounter (HOSPITAL_BASED_OUTPATIENT_CLINIC_OR_DEPARTMENT_OTHER): Payer: Medicare Other | Admitting: Internal Medicine

## 2020-02-13 DIAGNOSIS — L97822 Non-pressure chronic ulcer of other part of left lower leg with fat layer exposed: Secondary | ICD-10-CM | POA: Diagnosis not present

## 2020-02-13 DIAGNOSIS — I5032 Chronic diastolic (congestive) heart failure: Secondary | ICD-10-CM | POA: Diagnosis not present

## 2020-02-13 DIAGNOSIS — G4733 Obstructive sleep apnea (adult) (pediatric): Secondary | ICD-10-CM | POA: Diagnosis not present

## 2020-02-13 DIAGNOSIS — I110 Hypertensive heart disease with heart failure: Secondary | ICD-10-CM | POA: Diagnosis not present

## 2020-02-13 DIAGNOSIS — E669 Obesity, unspecified: Secondary | ICD-10-CM | POA: Diagnosis not present

## 2020-02-13 DIAGNOSIS — I872 Venous insufficiency (chronic) (peripheral): Secondary | ICD-10-CM | POA: Diagnosis not present

## 2020-02-16 DIAGNOSIS — N200 Calculus of kidney: Secondary | ICD-10-CM | POA: Diagnosis not present

## 2020-02-16 DIAGNOSIS — I110 Hypertensive heart disease with heart failure: Secondary | ICD-10-CM | POA: Diagnosis not present

## 2020-02-16 DIAGNOSIS — I5033 Acute on chronic diastolic (congestive) heart failure: Secondary | ICD-10-CM | POA: Diagnosis not present

## 2020-02-16 DIAGNOSIS — E1121 Type 2 diabetes mellitus with diabetic nephropathy: Secondary | ICD-10-CM | POA: Diagnosis not present

## 2020-02-17 ENCOUNTER — Other Ambulatory Visit: Payer: Self-pay

## 2020-02-17 ENCOUNTER — Encounter (HOSPITAL_BASED_OUTPATIENT_CLINIC_OR_DEPARTMENT_OTHER): Payer: Medicare Other | Attending: Internal Medicine | Admitting: Internal Medicine

## 2020-02-17 DIAGNOSIS — L97811 Non-pressure chronic ulcer of other part of right lower leg limited to breakdown of skin: Secondary | ICD-10-CM | POA: Diagnosis not present

## 2020-02-17 DIAGNOSIS — I87333 Chronic venous hypertension (idiopathic) with ulcer and inflammation of bilateral lower extremity: Secondary | ICD-10-CM | POA: Insufficient documentation

## 2020-02-17 DIAGNOSIS — I509 Heart failure, unspecified: Secondary | ICD-10-CM | POA: Insufficient documentation

## 2020-02-17 DIAGNOSIS — Z8616 Personal history of COVID-19: Secondary | ICD-10-CM | POA: Insufficient documentation

## 2020-02-17 DIAGNOSIS — Z8673 Personal history of transient ischemic attack (TIA), and cerebral infarction without residual deficits: Secondary | ICD-10-CM | POA: Insufficient documentation

## 2020-02-17 DIAGNOSIS — G4733 Obstructive sleep apnea (adult) (pediatric): Secondary | ICD-10-CM | POA: Insufficient documentation

## 2020-02-17 DIAGNOSIS — I872 Venous insufficiency (chronic) (peripheral): Secondary | ICD-10-CM | POA: Diagnosis not present

## 2020-02-17 DIAGNOSIS — I110 Hypertensive heart disease with heart failure: Secondary | ICD-10-CM | POA: Insufficient documentation

## 2020-02-17 DIAGNOSIS — I890 Lymphedema, not elsewhere classified: Secondary | ICD-10-CM | POA: Diagnosis not present

## 2020-02-17 DIAGNOSIS — L97821 Non-pressure chronic ulcer of other part of left lower leg limited to breakdown of skin: Secondary | ICD-10-CM | POA: Insufficient documentation

## 2020-02-18 NOTE — Progress Notes (Signed)
JACHOB MCCLEAN ([id number]) , Visit Report for 02/17/2020 Arrival Information Details Patient Name: Date of Service: [name] Ro RDIN W. 02/17/2020 3:45 PM Medical Record Number: [id number] Patient Account Number: [id number] Date of Birth/Sex: Treating RN: [birth year] (64 y.o. [name] [name], Meta.Reding Primary Care Kambree Krauss: [name] Other Clinician: Referring Maricsa Sammons: Treating Channah Godeaux/Extender: [name] in Treatment: 12 Visit Information History Since Last Visit Added or deleted any medications: No Patient Arrived: [name]: No Arrival Time: 16:20 Had a fall or experienced change in No Accompanied By: self activities of daily living that may affect Transfer Assistance: None risk of falls: Patient Identification Verified: Yes Signs or symptoms of abuse/neglect since last visito No Secondary Verification Process Completed: Yes Hospitalized since last visit: No Patient Requires Transmission-Based Precautions: No Implantable device outside of the clinic excluding No Patient Has Alerts: Yes cellular tissue based products placed in the center Patient Alerts: R ABI = 1.25 since last visit: L ABI = 1.27 Has Dressing in Place as Prescribed: Yes Has Compression in Place as Prescribed: Yes Pain Present Now: No Electronic Signature(s) Signed: 02/17/2020 5:56:12 PM By: Deon Pilling Entered By: Deon Pilling on 02/17/2020 16:31:33 -------------------------------------------------------------------------------- Compression Therapy Details Patient Name: Date of Service: [name] Ro RDIN W. 02/17/2020 3:45 PM Medical Record Number: [id number] Patient Account Number: [id number] Date of Birth/Sex: Treating RN: [birth year] (63 y.o. [name] [name] Primary Care Acen Craun: [name] Other Clinician: Referring Neyla Gauntt: Treating Eloisa Chokshi/Extender: [name] in Treatment: 12 Compression Therapy Performed for Wound  Assessment: Wound #65 Left,Circumferential Lower Leg Performed By: Clinician [name], RN Compression Type: Four Layer Post Procedure Diagnosis Same as Pre-procedure Electronic Signature(s) Signed: 02/18/2020 4:36:43 PM By: [name] RN Entered By: [name] on 02/17/2020 17:12:21 -------------------------------------------------------------------------------- Encounter Discharge Information Details Patient Name: Date of Service: [name] [name], [name] RDIN W. 02/17/2020 3:45 PM Medical Record Number: [id number] Patient Account Number: [id number] Date of Birth/Sex: Treating RN: [birth year] (64 y.o. [name] [name] Primary Care Shanaia Sievers: [name] Other Clinician: Referring Anneliese Leblond: Treating Terrianne Cavness/Extender: [name] in Treatment: 12 Encounter Discharge Information Items Discharge Condition: Stable Ambulatory Status: Cane Discharge Destination: Home Transportation: Private Auto Accompanied By: self Schedule Follow-up Appointment: Yes Clinical Summary of Care: Electronic Signature(s) Signed: 02/17/2020 5:56:12 PM By: Deon Pilling Entered By: Deon Pilling on 02/17/2020 17:55:40 -------------------------------------------------------------------------------- Lower Extremity Assessment Details Patient Name: Date of Service: [name] Ro RDIN W. 02/17/2020 3:45 PM Medical Record Number: [id number] Patient Account Number: [id number] Date of Birth/Sex: Treating RN: [name] [name], [name] [name] (64 y.o. [name] [name] Primary Care Teffany Blaszczyk: [name] Other Clinician: Referring Logan Baltimore: Treating Jashayla Glatfelter/Extender: [name] in Treatment: 12 Edema Assessment Assessed: [name]: Yes] [name]: Yes] Edema: [Left: Yes] [Right: Yes] Calf Left: Right: Point of Measurement: 30 cm From Medial Instep 44 cm 45 cm Ankle Left: Right: Point of Measurement: 13 cm From Medial Instep 25.5 cm 27.5 cm Vascular Assessment Pulses: Dorsalis Pedis Palpable:  [Left:Yes] [Right:Yes] Electronic Signature(s) Signed: 02/17/2020 5:56:12 PM By: Deon Pilling Entered By: Deon Pilling on 02/17/2020 16:33:17 -------------------------------------------------------------------------------- Multi Wound Chart Details Patient Name: Date of Service: [name] [name], [name] RDIN W. 02/17/2020 3:45 PM Medical Record Number: [id number] Patient Account Number: [id number] Date of Birth/Sex: Treating RN: [name] [name] (63 y.o. [name] [name] Primary Care Laurielle Selmon: [name] Other Clinician: Referring Ares Tegtmeyer: Treating Anetria Harwick/Extender: [name] in Treatment: 12 Vital Signs Height(in): 70 Pulse(bpm): 62 Weight(lbs): 360 Blood Pressure(mmHg): 178/93 Body Mass Index(BMI): 52  Temperature(F): 97.8 Respiratory Rate(breaths/min): 22 Photos: [65:No Photos Left, Circumferential Lower Leg] [66:No Photos Right, Lateral Lower Leg] [N/A:N/A N/A] Wound Location: [65:Gradually Appeared] [66:Gradually Appeared] [N/A:N/A] Wounding Event: [65:Venous Leg Ulcer] [66:Venous Leg Ulcer] [N/A:N/A] Primary Etiology: [65:Lymphedema] [66:Lymphedema] [N/A:N/A] Secondary Etiology: [65:Lymphedema, Sleep Apnea,] [66:N/A] [N/A:N/A] Comorbid History: [65:Congestive Heart Failure, Hypertension, Peripheral Venous Disease, Osteoarthritis 09/08/2019] [66:09/08/2019] [N/A:N/A] Date Acquired: [65:12] [66:12] [N/A:N/A] Weeks of Treatment: [65:Open] [66:Healed - Epithelialized] [N/A:N/A] Wound Status: [65:Yes] [66:Yes] [N/A:N/A] Clustered Wound: [65:15] [66:N/A] [N/A:N/A] Clustered Quantity: [65:14.5x12.5x0.1] [66:0x0x0] [N/A:N/A] Measurements L x W x D (cm) [65:142.353] [66:0] [N/A:N/A] A (cm) : rea [35:70.177] [66:0] [N/A:N/A] Volume (cm) : [65:76.90%] [66:100.00%] [N/A:N/A] % Reduction in Area: [65:76.90%] [66:100.00%] [N/A:N/A] % Reduction in Volume: [65:Full Thickness Without Exposed] [66:Full Thickness Without Exposed] [N/A:N/A] Classification: [65:Support Structures  Medium] [66:Support Structures N/A] [N/A:N/A] Exudate Amount: [65:Serous] [66:N/A] [N/A:N/A] Exudate Type: [65:amber] [66:N/A] [N/A:N/A] Exudate Color: [65:Flat and Intact] [66:N/A] [N/A:N/A] Wound Margin: [65:Large (67-100%)] [66:N/A] [N/A:N/A] Granulation Amount: [65:Red] [66:N/A] [N/A:N/A] Granulation Quality: [65:None Present (0%)] [66:N/A] [N/A:N/A] Necrotic Amount: [65:Fascia: No] [66:N/A] [N/A:N/A] Exposed Structures: [65:Fat Layer (Subcutaneous Tissue) Exposed: No Tendon: No Muscle: No Joint: No Bone: No Limited to Skin Breakdown Small (1-33%)] [66:N/A] [N/A:N/A] Epithelialization: [65:Compression Therapy] [66:N/A] [N/A:N/A] Treatment Notes Electronic Signature(s) Signed: 02/17/2020 5:52:05 PM By: Linton Ham MD Signed: 02/18/2020 4:36:43 PM By: [name] RN Entered By: Linton Ham on 02/17/2020 17:42:38 -------------------------------------------------------------------------------- Multi-Disciplinary Care [name] Details Patient Name: Date of Service: [name] [name], [name] RDIN W. 02/17/2020 3:45 PM Medical Record Number: [id number] Patient Account Number: [id number] Date of Birth/Sex: Treating RN: [name] [name] (63 y.o. [name] [name] Primary Care Raj Landress: [name] Other Clinician: Referring Marquerite Forsman: Treating Jullian Clayson/Extender: [name] in Treatment: 12 Active Inactive Wound/Skin Impairment Nursing Diagnoses: Knowledge deficit related to ulceration/compromised skin integrity Goals: Patient/caregiver will verbalize understanding of skin care regimen Date Initiated: 11/25/2019 Target Resolution Date: 02/23/2020 Goal Status: Active Ulcer/skin breakdown will have a volume reduction of 30% by week 4 Date Initiated: 11/25/2019 Date Inactivated: 01/06/2020 Target Resolution Date: 12/26/2019 Goal Status: Met Ulcer/skin breakdown will have a volume reduction of 50% by week 8 Date Initiated: 01/06/2020 Date Inactivated: 02/17/2020 Target Resolution Date:  02/05/2020 Goal Status: Met Ulcer/skin breakdown will have a volume reduction of 80% by week 12 Date Initiated: 02/17/2020 Target Resolution Date: 03/07/2020 Goal Status: Active Interventions: Assess patient/caregiver ability to obtain necessary supplies Assess patient/caregiver ability to perform ulcer/skin care regimen upon admission and as needed Assess ulceration(s) every visit Notes: Electronic Signature(s) Signed: 02/18/2020 4:36:43 PM By: [name] RN Entered By: [name] on 02/17/2020 17:10:49 -------------------------------------------------------------------------------- Pain Assessment Details Patient Name: Date of Service: [name] Ro RDIN W. 02/17/2020 3:45 PM Medical Record Number: [id number] Patient Account Number: [id number] Date of Birth/Sex: Treating RN: [name] [name] (64 y.o. [name] [name] Primary Care Shonya Sumida: [name] Other Clinician: Referring Oral Remache: Treating Maks Cavallero/Extender: [name] in Treatment: 12 Active Problems Location of Pain Severity and Description of Pain Patient Has Paino No Site Locations Rate the pain. Rate the pain. Current Pain Level: 0 Pain Management and Medication Current Pain Management: Medication: No Cold Application: No Rest: No Massage: No Activity: No T.E.N.S.: No Heat Application: No Leg drop or elevation: No Is the Current Pain Management Adequate: Adequate How does your wound impact your activities of daily livingo Sleep: No Bathing: No Appetite: No Relationship With Others: No Bladder Continence: No Emotions: No Bowel Continence: No Work: No Toileting: No Drive: No Dressing: No Hobbies: No Electronic Signature(s) Signed: 02/17/2020  5:56:12 PM By: Deon Pilling Entered By: Deon Pilling on 02/17/2020 16:32:44 -------------------------------------------------------------------------------- Patient/Caregiver Education Details Patient Name: Date of Service: [name] [name] 6/1/2021andnbsp3:45 PM Medical Record Number: [id number] Patient Account Number: [id number] Date of Birth/Gender: Treating RN: [name] [name] (63 y.o. [name] [name]) [name] Primary Care Physician: [name] Other Clinician: Referring Physician: Treating Physician/Extender: [name] in Treatment: 12 Education Assessment Education Provided To: Patient Education Topics Provided Wound/Skin Impairment: Methods: Explain/Verbal Responses: State content correctly Electronic Signature(s) Signed: 02/18/2020 4:36:43 PM By: [name] RN Entered By: [name] on 02/17/2020 15:53:40 -------------------------------------------------------------------------------- Wound Assessment Details Patient Name: Date of Service: [name] [name]. 02/17/2020 3:45 PM Medical Record Number: [id number] Patient Account Number: [id number] Date of Birth/Sex: Treating RN: [name] [name] (64 y.o. [name] [name] Primary Care Junior Kenedy: [name] Other Clinician: Referring Ed Mandich: Treating Kaliope Quinonez/Extender: [name] in Treatment: 12 Wound Status Wound Number: 65 Primary Venous Leg Ulcer Etiology: Wound Location: Left, Circumferential Lower Leg Secondary Lymphedema Wounding Event: Gradually Appeared Etiology: Date Acquired: 09/08/2019 Wound Open Weeks Of Treatment: 12 Status: Clustered Wound: Yes Comorbid Lymphedema, Sleep Apnea, Congestive Heart Failure, History: Hypertension, Peripheral Venous Disease, Osteoarthritis Wound Measurements Length: (cm) 14.5 Width: (cm) 12.5 Depth: (cm) 0.1 Clustered Quantity: 15 Area: (cm) 142.353 Volume: (cm) 14.235 % Reduction in Area: 76.9% % Reduction in Volume: 76.9% Epithelialization: Small (1-33%) Tunneling: No Undermining: No Wound Description Classification: Full Thickness Without Exposed Support Structures Wound Margin: Flat and Intact Exudate Amount: Medium Exudate Type: Serous Exudate Color:  amber Foul Odor After Cleansing: No Slough/Fibrino No Wound Bed Granulation Amount: Large (67-100%) Exposed Structure Granulation Quality: Red Fascia Exposed: No Necrotic Amount: None Present (0%) Fat Layer (Subcutaneous Tissue) Exposed: No Tendon Exposed: No Muscle Exposed: No Joint Exposed: No Bone Exposed: No Limited to Skin Breakdown Treatment Notes Wound #65 (Left, Circumferential Lower Leg) 1. Cleanse With Wound Cleanser Soap and water 2. Periwound Care Moisturizing lotion TCA Cream 3. Primary Dressing Applied Hydrofera Blue 4. Secondary Dressing ABD Pad 6. Support Layer Applied 4 layer compression wrap Notes stockinette. Electronic Signature(s) Signed: 02/17/2020 5:56:12 PM By: Deon Pilling Entered By: Deon Pilling on 02/17/2020 16:34:[name] -------------------------------------------------------------------------------- Wound Assessment Details Patient Name: Date of Service: [name] Ro RDIN W. 02/17/2020 3:45 PM Medical Record Number: [id number] Patient Account Number: [id number] Date of Birth/Sex: Treating RN: [name] [name] (64 y.o. [name] [name] Primary Care Maili Shutters: [name] Other Clinician: Referring Deyna Carbon: Treating Anylah Scheib/Extender: [name] in Treatment: 12 Wound Status Wound Number: 66 Primary Etiology: Venous Leg Ulcer Wound Location: Right, Lateral Lower Leg Secondary Etiology: Lymphedema Wounding Event: Gradually Appeared Wound Status: Healed - Epithelialized Date Acquired: 09/08/2019 Weeks Of Treatment: 12 Clustered Wound: Yes Wound Measurements Length: (cm) Width: (cm) Depth: (cm) Area: (cm) Volume: (cm) 0 % Reduction in Area: 100% 0 % Reduction in Volume: 100% 0 0 0 Wound Description Classification: Full Thickness Without Exposed Support Structur es Electronic Signature(s) Signed: 02/17/2020 5:56:12 PM By: Deon Pilling Entered By: Deon Pilling on 02/17/2020  16:33:30 -------------------------------------------------------------------------------- Vitals Details Patient Name: Date of Service: [name] [name], [name] RDIN W. 02/17/2020 3:45 PM Medical Record Number: [id number] Patient Account Number: [id number] Date of Birth/Sex: Treating RN: [name] [name]56 (64 y.o. [name] [name] Primary Care Jeremyah Jelley: [name] Other Clinician: Referring Jesicca Dipierro: Treating Chanie Soucek/Extender: [name] in Treatment: 12 Vital Signs Time Taken: 16:25 Temperature (F): 97.8 Height (in): 70 Pulse (bpm): 62 Weight (lbs): 360 Respiratory Rate (breaths/min): 22 Body Mass Index (BMI):  51.6 Blood Pressure (mmHg): 178/93 Reference Range: 80 - 120 mg / dl Electronic Signature(s) Signed: 02/17/2020 5:56:12 PM By: Deon Pilling Entered By: Deon Pilling on 02/17/2020 16:32:08

## 2020-02-18 NOTE — Progress Notes (Signed)
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 obstructive sleep apnea. He has compression pumps at home but he has been completely noncompliant with this by his own admission. He is not a diabetic. ABIs in our clinic were 1.27 on the right and 1.25 on the left we do not have time for this this afternoon I am and I am not comfortable 10/9; the patient arrives with green drainage under the compression right greater than left. He is not complaining of any pain. He also almost  circumferential epithelial loss on the right. He has home health changing the dressing. We are doing 2-week follow-ups. He lives in [place] 10/23; 2-week follow-up. The patient took his antibiotics he seems to have tolerated this well. He has still areas on the right and left calf left more substantially. I think he has some improvement in the epithelialization. He has new wounds on the left dorsal foot today. He says he has been using compression pumps 11/6; two-week follow-up. The patient arrived still with wounds mostly on his lateral lower legs. He has a new area on the right dorsal foot today just in close proximity to his toes. His edema control is marginal. He will not use his compression pumps. He has home health changing the dressings. He tells [place] that his wife is in the hospital in [place] with heart failure. I suspect he is sitting at her bedside for most the day. His legs are probably dependent. 12/4;  1 month follow-up. He arrives with everything on his legs completely closed. He has some form of external compression garment at home as well as compression pumps. READMISSION 11/25/2019 We discharge this patient on 08/22/2019 with everything on his bilateral lower legs closed. He had an external compression stocking for both legs at home as well as compression pumps although admittedly he has never been compliant with the latter. He states his legs stay closed for about 2 or 3 weeks. He ended up in hospital at [place] from 12/22 through 12/29 with Covid infection. He came home and is gradually been regaining his strength. Currently has bilateral innumerable wounds almost circumferentially on both lower legs in the setting of severe venous inflammation but no current infection. The patient has diastolic heart failure hypertension history of TIA chronic venous ulcers had obstructive sleep apnea although he is not compliant with CPAP. He was never felt to have an arterial issue in this  clinic his pedal pulses and ABIs have been normal most recently in September/20 at 1.25 on the right and 1.27 on the left 3/23; he arrives with much less edema in both legs. He has home health changing the dressings. Been using silver alginate. He only has an open area along the wrap line of both legs. The rest of this seems to be closed. 4/6; better edema control in our compression wraps. He has not been using his external compression pumps he tells me because his wife was hospitalized for about 10 days. We have been using silver alginate on the wounds. 4/20; he has good edema control and compression wraps. His wife is back at [facility] he says he has not been using the external compression pumps. Silver alginate to the wounds 5/4; he has good edema control bilaterally. He has not been using the compression pumps he tells [place] his wife is coming home from [place] today. He does not have any open wounds on the right leg continued large collection of small wounds on the left anterior leg extending medially into laterally nothing much posteriorly on the left. 6/1. He has a smattering of small wounds anteriorly on the left and a larger but superficial wound on the left posterior calf. There is nothing open on the right we have been using silver alginate on the left I will change that the Hydrofera Blue today Objective Constitutional Patient is hypertensive.. Pulse regular and within target range for patient.Marland Kitchen Respirations regular, non-labored and within target range.. Temperature is normal and within the target range for the patient.Marland Kitchen Appears in no distress. Vitals Time Taken: 4:25 PM, Height: 70 in, Weight: 360 lbs, BMI: 51.6, Temperature: 97.8 F, Pulse: 62 bpm, Respiratory Rate: 22 breaths/min, Blood Pressure: 178/93 mmHg. General Notes: Wound exam; smattering of small wounds on the left anterior leg. A larger area on the left posterior calf but this is superficial. No evidence of infection pedal  pulses are palpable ooOn the right he has no open area. We have good edema control in both legs. Integumentary (Hair, Skin) Wound #65 status is Open. Original cause of wound was Gradually Appeared. The wound is located on the Left,Circumferential Lower Leg. The wound measures 14.5cm length x 12.5cm width x 0.1cm depth; 142.353cm^2 area and 14.235cm^3 volume. The wound is limited to skin breakdown. There is no tunneling or undermining noted. There is a medium amount of serous drainage noted. The wound margin is flat and intact. There is large (67-100%) red granulation within the wound bed. There is no necrotic tissue  within the wound bed. Wound #66 status is Healed - Epithelialized. Original cause of wound was Gradually Appeared. The wound is located on the Right,Lateral Lower Leg. The wound measures 0cm length x 0cm width x 0cm depth; 0cm^2 area and 0cm^3 volume. 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 There was a Four Layer Compression Therapy Procedure by [name], RN. Post procedure Diagnosis Wound #65: Same as Pre-Procedure Plan Follow-up Appointments: Return Appointment in 1 week. Dressing Change Frequency: Other: - 2 times per week Skin Barriers/Peri-Wound Care: TCA Cream or Ointment - lightly from knee to ankle on left and right lower legs Wound Cleansing: Wound #65 Left,Circumferential Lower Leg: Clean wound with Normal Saline. Primary Wound Dressing: Wound #65 Left,Circumferential Lower Leg: Hydrofera Blue Secondary Dressing: Wound #65  Left,Circumferential Lower Leg: ABD pad Edema Control: 4 layer compression - Bilateral Elevate legs to the level of the heart or above for 30 minutes daily and/or when sitting, a frequency of: Segmental Compressive Device. - 1 hour per day Home Health: Wound #65 Left,Circumferential Lower Leg: Continue Home Health skilled nursing for wound care. - amedysis 1. I am going to use Hydrofera Blue on the left both anteriorly and posteriorly 2. Wrap both legs and 4-layer compression 3. Once again I have asked him to bring in the juxta lite stocking for the right leg next time I see him and also to use his compression pumps at least once a day Electronic Signature(s) Signed: 02/17/2020 5:52:05 PM By: Linton Ham MD Entered By: Linton Ham on 02/17/2020 17:45:00 -------------------------------------------------------------------------------- SuperBill Details Patient Name: Date of Service: [name] [name], Strawn W. 02/17/2020 Medical Record Number: [id number] Patient Account Number: [id number] Date of Birth/Sex: Treating RN: 06-22-1956 (63 y.o. [name] [name] Primary Care Provider: [name] Other Clinician: Referring Provider: Treating Provider/Extender: [name] in Treatment: 12 Diagnosis Coding ICD-10 Codes Code Description [phone] Chronic venous hypertension (idiopathic) with ulcer and inflammation of bilateral lower extremity I89.0 Lymphedema, not elsewhere classified L97.821 Non-pressure chronic ulcer of other part of left lower leg limited to breakdown of skin L97.811 Non-pressure chronic ulcer of other part of right lower leg limited to breakdown of skin Facility Procedures CPT4: Code [id number] fo Description: [id number] BILATERAL: Application of multi-layer venous compression system; leg (below knee), including ankle and ot. ICD-10 Diagnosis Description L97.811 Non-pressure chronic ulcer of other part of right lower leg limited to breakdown of  skin Modifier: Quantity: 1 Physician Procedures : CPT4 Code Description Modifier [id number] - WC PHYS LEVEL 3 - EST PT ICD-10 Diagnosis Description I87.333 Chronic venous hypertension (idiopathic) with ulcer and inflammation of bilateral lower extremity I89.0 Lymphedema, not elsewhere classified  L97.821 Non-pressure chronic ulcer of other part of left lower leg limited to breakdown of skin Quantity: 1 : [id number] BILATERAL: Application of multi-layer venous compression system; leg (below knee), including ankle and foot. ICD-10 Diagnosis Description L97.811 Non-pressure chronic ulcer of other part of right lower leg limited to breakdown of skin Quantity: 1 Electronic Signature(s) Signed: 02/17/2020 5:52:05 PM By: Linton Ham MD Entered By: Linton Ham on 02/17/2020 17:45:23

## 2020-03-02 ENCOUNTER — Encounter (HOSPITAL_BASED_OUTPATIENT_CLINIC_OR_DEPARTMENT_OTHER): Payer: Medicare Other | Admitting: Internal Medicine

## 2020-03-09 ENCOUNTER — Other Ambulatory Visit: Payer: Self-pay

## 2020-03-09 ENCOUNTER — Encounter (HOSPITAL_BASED_OUTPATIENT_CLINIC_OR_DEPARTMENT_OTHER): Payer: Medicare Other | Admitting: Internal Medicine

## 2020-03-09 NOTE — Progress Notes (Signed)
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 the larger number of small wounds on the left. He has been using silver alginate and and 4-layer compression. He still has [facility] home health care coming out. He tells me he went to [facility] wound care twice. They healed him out after that he does not think he was actually healed. His wife at the time was in [place] after falling and fracturing  her femur by the sound of it. She is currently in a nursing home in [place] He comes into clinic today with large areas of superficial denuded epithelium which is almost circumferential on the right and a large area on the left lateral. His edema control is marginal. He is not in any pain. Past medical history includes congestive heart failure, previous venous ablation, lymphedema and obstructive sleep apnea. He has compression pumps at home but he has been completely noncompliant with this by his own admission. He is not a diabetic. ABIs in our clinic were 1.27 on the right and 1.25 on the left we do not have time for this this afternoon I am and I am not comfortable 10/9; the patient arrives with green drainage under the compression right greater than left. He is not complaining of any pain. He also almost circumferential epithelial loss on the right. He has home health changing the dressing. We are doing 2-week follow-ups. He lives in  [place] 10/23; 2-week follow-up. The patient took his antibiotics he seems to have tolerated this well. He has still areas on the right and left calf left more substantially. I think he has some improvement in the epithelialization. He has new wounds on the left dorsal foot today. He says he has been using compression pumps 11/6; two-week follow-up. The patient arrived still with wounds mostly on his lateral lower legs. He has a new area on the right dorsal foot today just in close proximity to his toes. His edema control is marginal. He will not use his compression pumps. He has home health changing the dressings. He tells [place] that his wife is in the hospital in [place] with heart failure. I suspect he is sitting at her bedside for most the day. His legs are probably dependent. 12/4; 1 month follow-up. He arrives with everything on his legs completely closed. He has some form of external compression garment at home as well as compression  pumps. READMISSION 11/25/2019 We discharge this patient on 08/22/2019 with everything on his bilateral lower legs closed. He had an external compression stocking for both legs at home as well as compression pumps although admittedly he has never been compliant with the latter. He states his legs stay closed for about 2 or 3 weeks. He ended up in hospital at [facility] from 12/22 through 12/29 with Covid infection. He came home and is gradually been regaining his strength. Currently has bilateral innumerable wounds almost circumferentially on both lower legs in the setting of severe venous inflammation but no current infection. The patient has diastolic heart failure hypertension history of TIA chronic venous ulcers had obstructive sleep apnea although he is not compliant with CPAP. He was never felt to have an arterial issue in this clinic his pedal pulses and ABIs have been normal most recently in September/20 at 1.25 on the right and 1.27 on the left 3/23; he arrives with much less edema in both legs. He has home health changing the dressings. Been using silver alginate. He only has an open area along the wrap line of both legs. The rest of this seems to be closed. 4/6; better edema control in our compression wraps. He has not been using his external compression pumps he tells me because his wife was hospitalized for about 10 days. We have been using silver alginate on the wounds. 4/20; he has good edema control and compression wraps. His wife is back at [facility] he says he has not been using the external compression pumps. Silver alginate to the wounds 5/4; he has good edema control bilaterally. He has not been using the compression pumps he tells [place] his wife is coming home from [place] today. He does not have any open wounds on the right leg continued large collection of small wounds on the left anterior leg extending medially into laterally nothing much posteriorly on the left. 6/1. He has a  smattering of small wounds anteriorly on the left and a larger but superficial wound on the left posterior calf. There is nothing open on the right we have been using silver alginate on the left I will change that the [facility] today 6/22; there is nothing open on his right leg. He has a smattering of small eschars on the left anterior lower leg but no open wounds per se. Somebody applied the compression wrap on the right leg in a very irregular fashion. He has a very tender area on the right medial ankle. This is  probably related to stasis dermatitis but I cannot rule out cellulitis in this area Objective Constitutional Patient is hypertensive.. Pulse regular and within target range for patient.Marland Kitchen Respirations regular, non-labored and within target range.. Temperature is normal and within the target range for the patient.Marland Kitchen Appears in no distress. Vitals Time Taken: 12:56 PM, Height: 70 in, Source: Stated, Weight: 360 lbs, Source: Stated, BMI: 51.6, Temperature: 98.2 F, Pulse: 59 bpm, Respiratory Rate: 22 breaths/min, Blood Pressure: 157/98 mmHg. Respiratory work of breathing is normal. Shallow but normal air entry.. Cardiovascular Heart rhythm and rate regular, without murmur or gallop.. Pedal pulses are palpable. General Notes: Wound exam; there is nothing open on the right leg although very poor job of wrapping this leg presumably by home health. He has a very tender area on the right medial ankle which could be cellulitis or it could just be a localized area of severe stasis dermatitis nevertheless he is going to require an antibiotic. ooThe left leg is nicely controlled in terms of edema. Dry flaking areas of eschar but no real open wound Integumentary (Hair, Skin) Area of tender erythema on the right medial ankle. This is not near any open wound. Wound #65 status is Open. Original cause of wound was Gradually Appeared. The wound is located on the Left,Circumferential Lower Leg. The  wound measures 0.1cm length x 0.1cm width x 0.1cm depth; 0.008cm^2 area and 0.001cm^3 volume. The wound is limited to skin breakdown. There is no tunneling or undermining noted. There is a small amount of serous drainage noted. The wound margin is flat and intact. There is no granulation within the wound bed. There is no necrotic tissue within the wound bed. 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 Skin Barriers/Peri-Wound Care: TCA Cream or Ointment - lightly from knee to ankle on left and right lower legs Edema Control: Elevate legs to the level of the heart or above for 30 minutes daily and/or when sitting, a frequency of: Support Garment 20-30 mm/Hg pressure to: - right and left lower legs Segmental Compressive Device. - 1 hour per day Home Health: Wound #65 Left,Circumferential Lower Leg: Continue Home Health skilled nursing for wound care. - amedysis The following medication(s) was prescribed: cephalexin oral 500 mg capsule 1 capsule oral q8h for 7 days for celluitis right ankle starting 03/09/2020 1. The patient technically has no wound on either leg although on the left he has some finer eschared areas 2. He has is extremities stockings and we are going to use these I am not going to wrap him today. 3. Skin lubrication important 4. I have asked him to use his pumps at least once a day. He has not been compliant with this in the past. I will see him again in 2 weeks to follow-up on things. 5. Empiric cephalexin 500 every 8 for 7 days for the presumed area of cellulitis in the right medial  ankle although this might represent a significant stasis area. Electronic Signature(s) Signed: 03/09/2020 1:42:02 PM By: Linton Ham MD Entered By: Linton Ham on 03/09/2020 13:42:02 -------------------------------------------------------------------------------- SuperBill Details Patient Name: Date of Service: [name], [place]. 03/09/2020 Medical Record Number: [id number] Patient Account Number:  [id number] Date of Birth/Sex: Treating RN: 07/08/1956 (63 y.o. [name] [name]) [name] Primary Care Provider: [name] Other Clinician: Referring Provider: Treating Provider/Extender: [name] in Treatment: 15 Diagnosis Coding ICD-10 Codes Code Description [phone] Chronic venous hypertension (idiopathic) with ulcer and inflammation of bilateral lower extremity I89.0 Lymphedema, not elsewhere classified L97.821 Non-pressure chronic ulcer of other part of left lower leg limited to breakdown of skin L97.811 Non-pressure chronic ulcer of other part of right lower leg limited to breakdown of skin Physician Procedures : CPT4 Code Description Modifier [id number] - WC PHYS LEVEL 3 - EST PT ICD-10 Diagnosis Description I87.333 Chronic venous hypertension (idiopathic) with ulcer and inflammation of bilateral lower extremity L97.821 Non-pressure chronic ulcer of other  part of left lower leg limited to breakdown of skin L97.811 Non-pressure chronic ulcer of other part of right lower leg limited to breakdown of skin I89.0 Lymphedema, not elsewhere classified Quantity: 1 Electronic Signature(s) Signed: 03/09/2020 5:20:04 PM By: Linton Ham MD Entered By: Linton Ham on 03/09/2020 13:35:52

## 2020-03-10 NOTE — Progress Notes (Addendum)
[name] [name] ([id number]) , Visit Report for 03/09/2020 Arrival Information Details Patient Name: Date of Service: [name] Ro RDIN W. 03/09/2020 12:30 PM Medical Record Number: [id number] Patient Account Number: [id number] Date of Birth/Sex: Treating RN: 10/23/1955 (64 y.o. [name] [name] Primary Care [name] [name]: [name] Other Clinician: Referring [name] [name]: Treating [name] [name]/Extender: [name] [name] in Treatment: 15 Visit Information History Since Last Visit Added or deleted any medications: No Patient Arrived: [name] [name] Any new allergies or adverse reactions: No Arrival Time: 12:43 Had a fall or experienced change in No Accompanied By: self activities of daily living that may affect Transfer Assistance: None risk of falls: Patient Identification Verified: Yes Signs or symptoms of abuse/neglect since last visito No Secondary Verification Process Completed: Yes Hospitalized since last visit: No Patient Requires Transmission-Based Precautions: No Implantable device outside of the clinic excluding No Patient Has Alerts: Yes cellular tissue based products placed in the center Patient Alerts: R ABI = 1.25 since last visit: L ABI = 1.27 Has Dressing in Place as Prescribed: Yes Has Compression in Place as Prescribed: Yes Pain Present Now: No Electronic Signature(s) Signed: 03/09/2020 5:28:52 PM By: [name] RN, BSN Entered By: [name] on 03/09/2020 12:56:40 -------------------------------------------------------------------------------- Encounter Discharge Information Details Patient Name: Date of Service: [name] [name], [name] RDIN W. 03/09/2020 12:30 PM Medical Record Number: [id number] Patient Account Number: [id number] Date of Birth/Sex: Treating RN: 1956/06/22 (64 y.o. [name] [name] Primary Care [name] [name]: [name] Other Clinician: Referring [name] [name]: Treating [name] [name]/Extender: [name] [name] in Treatment:  15 Encounter Discharge Information Items Discharge Condition: Stable Ambulatory Status: Cane Discharge Destination: Home Transportation: Private Auto Accompanied By: self Schedule Follow-up Appointment: Yes Clinical Summary of Care: Patient Declined Electronic Signature(s) Signed: 03/09/2020 5:14:59 PM By: Kela Millin Entered By: Kela Millin on 03/09/2020 13:27:39 -------------------------------------------------------------------------------- Lower Extremity Assessment Details Patient Name: Date of Service: [name] Ro RDIN W. 03/09/2020 12:30 PM Medical Record Number: [id number] Patient Account Number: [id number] Date of Birth/Sex: Treating RN: 08-31-1956 (64 y.o. [name] [name] Primary Care Jourdon Zimmerle: [name] Other Clinician: Referring Daishaun Ayre: Treating [name] [name]/Extender: [name] [name] in Treatment: 15 Edema Assessment Assessed: [name] [name]: No] [name] [name]: No] Edema: [Left: Yes] [Right: Yes] Calf Left: Right: Point of Measurement: 30 cm From Medial Instep 40.5 cm 41.2 cm Ankle Left: Right: Point of Measurement: 13 cm From Medial Instep 25.6 cm 29.6 cm Vascular Assessment Pulses: Dorsalis Pedis Palpable: [Left:Yes] [Right:Yes] Electronic Signature(s) Signed: 03/09/2020 5:28:52 PM By: [name] RN, BSN Entered By: [name] on 03/09/2020 12:58:54 -------------------------------------------------------------------------------- Multi Wound Chart Details Patient Name: Date of Service: [name] [name], [place] W. 03/09/2020 12:30 PM Medical Record Number: [id number] Patient Account Number: [id number] Date of Birth/Sex: Treating RN: 1956/06/14 (63 y.o. [name] [name]) [name] [name] Primary Care [name] [name]: [name] Other Clinician: Referring Quanetta Truss: Treating [name] [name]/Extender: [name] [name] in Treatment: 15 Vital Signs Height(in): 70 Pulse(bpm): 70 Weight(lbs): 360 Blood Pressure(mmHg): 157/98 Body Mass Index(BMI):  52 Temperature(F): 98.2 Respiratory Rate(breaths/min): 22 Photos: [65:No Photos Left, Circumferential Lower Leg] [N/A:N/A N/A] Wound Location: [65:Gradually Appeared] [N/A:N/A] Wounding Event: [65:Venous Leg Ulcer] [N/A:N/A] Primary Etiology: [65:Lymphedema] [N/A:N/A] Secondary Etiology: [65:Lymphedema, Sleep Apnea,] [N/A:N/A] Comorbid History: [65:Congestive Heart Failure, Hypertension, Peripheral Venous Disease, Osteoarthritis 09/08/2019] [N/A:N/A] Date Acquired: [65:15] [N/A:N/A] Weeks of Treatment: [65:Open] [N/A:N/A] Wound Status: [65:Yes] [N/A:N/A] Clustered Wound: [65:15] [N/A:N/A] Clustered Quantity: [65:0.1x0.1x0.1] [N/A:N/A] Measurements L x W x D (cm) [65:0.008] [N/A:N/A] A (cm) : rea [65:0.001] [N/A:N/A] Volume (cm) : [65:100.00%] [N/A:N/A] % Reduction in Area: [65:100.00%] [  N/A:N/A] % Reduction in Volume: [65:Full Thickness Without Exposed] [N/A:N/A] Classification: [65:Support Structures Small] [N/A:N/A] Exudate Amount: [65:Serous] [N/A:N/A] Exudate Type: [65:amber] [N/A:N/A] Exudate Color: [65:Flat and Intact] [N/A:N/A] Wound Margin: [65:None Present (0%)] [N/A:N/A] Granulation Amount: [65:None Present (0%)] [N/A:N/A] Necrotic Amount: [65:Fascia: No] [N/A:N/A] Exposed Structures: [65:Fat Layer (Subcutaneous Tissue) Exposed: No Tendon: No Muscle: No Joint: No Bone: No Limited to Skin Breakdown Large (67-100%)] [N/A:N/A] Treatment Notes Wound #65 (Left, Circumferential Lower Leg) 2. Periwound Care TCA Cream Notes compression stocking Electronic Signature(s) Signed: 03/09/2020 5:20:04 PM By: Linton Ham MD Signed: 03/10/2020 4:51:47 PM By: [name] Coria RN Entered By: Linton Ham on 03/09/2020 13:31:18 -------------------------------------------------------------------------------- Multi-Disciplinary Care [name] Details Patient Name: Date of Service: [name] [name], [name] [place] W. 03/09/2020 12:30 PM Medical Record Number: [id number] Patient Account Number:  [id number] Date of Birth/Sex: Treating RN: 01/24/1956 (63 y.o. [name] [name] Primary Care Carmelle Bamberg: [name] Other Clinician: Referring [name] [name]: Treating Ayron Fillinger/Extender: [name] [name] in Treatment: 15 Active Inactive Wound/Skin Impairment Nursing Diagnoses: Knowledge deficit related to ulceration/compromised skin integrity Goals: Patient/caregiver will verbalize understanding of skin care regimen Date Initiated: 11/25/2019 Target Resolution Date: 03/24/2020 Goal Status: Active Ulcer/skin breakdown will have a volume reduction of 30% by week 4 Date Initiated: 11/25/2019 Date Inactivated: 01/06/2020 Target Resolution Date: 12/26/2019 Goal Status: Met Ulcer/skin breakdown will have a volume reduction of 50% by week 8 Date Initiated: 01/06/2020 Date Inactivated: 02/17/2020 Target Resolution Date: 02/05/2020 Goal Status: Met Ulcer/skin breakdown will have a volume reduction of 80% by week 12 Date Initiated: 02/17/2020 Target Resolution Date: 03/07/2020 Goal Status: Active Interventions: Assess patient/caregiver ability to obtain necessary supplies Assess patient/caregiver ability to perform ulcer/skin care regimen upon admission and as needed Assess ulceration(s) every visit Notes: Electronic Signature(s) Signed: 03/10/2020 4:51:47 PM By: [name] Coria RN Entered By: [name] [name] on 03/09/2020 12:48:14 -------------------------------------------------------------------------------- Pain Assessment Details Patient Name: Date of Service: [name] Ro RDIN W. 03/09/2020 12:30 PM Medical Record Number: [id number] Patient Account Number: [id number] Date of Birth/Sex: Treating RN: [birth year] (64 y.o. [name] [name] Primary Care Magaline Steinberg: [name] Other Clinician: Referring Amaurie Wandel: Treating Annalese Stiner/Extender: [name] [name] in Treatment: 15 Active Problems Location of Pain Severity and Description of Pain Patient Has Paino No Site  Locations Rate the pain. Current Pain Level: 0 Pain Management and Medication Current Pain Management: Electronic Signature(s) Signed: 03/09/2020 5:28:52 PM By: [name] RN, BSN Entered By: [name] on 03/09/2020 12:57:19 -------------------------------------------------------------------------------- Patient/Caregiver Education Details Patient Name: Date of Service: [name] [name] 6/22/2021andnbsp12:30 PM Medical Record Number: [id number] Patient Account Number: [id number] Date of Birth/Gender: Treating RN: 11/21/1955 (63 y.o. [name] [name]) [name] [name] Primary Care Physician: [name] Other Clinician: Referring Physician: Treating Physician/Extender: [name] [name] in Treatment: 15 Education Assessment Education Provided To: Patient Education Topics Provided Wound/Skin Impairment: Methods: Explain/Verbal Responses: State content correctly Electronic Signature(s) Signed: 03/10/2020 4:51:47 PM By: [name] Coria RN Entered By: [name] [name] on 03/09/2020 12:48:35 -------------------------------------------------------------------------------- Wound Assessment Details Patient Name: Date of Service: [name] [name]. 03/09/2020 12:30 PM Medical Record Number: [id number] Patient Account Number: [id number] Date of Birth/Sex: Treating RN: 1955-12-14 (64 y.o. [name] [name] Primary Care Kishan Wachsmuth: [name] Other Clinician: Referring Saleem Coccia: Treating Kellar Westberg/Extender: [name] [name] in Treatment: 15 Wound Status Wound Number: 65 Primary Venous Leg Ulcer Etiology: Wound Location: Left, Circumferential Lower Leg Secondary Lymphedema Wounding Event: Gradually Appeared Etiology: Date Acquired: 09/08/2019 Wound Open Weeks Of Treatment: 15 Status: Clustered Wound: Yes Comorbid Lymphedema, Sleep  Apnea, Congestive Heart Failure, History: Hypertension, Peripheral Venous Disease, Osteoarthritis Photos Wound  Measurements Length: (cm) 0.1 Width: (cm) 0.1 Depth: (cm) 0.1 Clustered Quantity: 15 Area: (cm) 0.008 Volume: (cm) 0.001 % Reduction in Area: 100% % Reduction in Volume: 100% Epithelialization: Large (67-100%) Tunneling: No Undermining: No Wound Description Classification: Full Thickness Without Exposed Support Structures Wound Margin: Flat and Intact Exudate Amount: Small Exudate Type: Serous Exudate Color: amber Foul Odor After Cleansing: No Slough/Fibrino No Wound Bed Granulation Amount: None Present (0%) Exposed Structure Necrotic Amount: None Present (0%) Fascia Exposed: No Fat Layer (Subcutaneous Tissue) Exposed: No Tendon Exposed: No Muscle Exposed: No Joint Exposed: No Bone Exposed: No Limited to Skin Breakdown Treatment Notes Wound #65 (Left, Circumferential Lower Leg) 2. Periwound Care TCA Cream Notes compression stocking Electronic Signature(s) Signed: 03/11/2020 5:20:52 PM By: [name] RN, BSN Signed: 03/12/2020 5:12:49 PM By: Minerva Fester Previous Signature: 03/09/2020 5:28:52 PM Version By: [name] RN, BSN Entered By: Minerva Fester on 03/11/2020 14:29:20 -------------------------------------------------------------------------------- Vitals Details Patient Name: Date of Service: [name] [name], [name] RDIN W. 03/09/2020 12:30 PM Medical Record Number: [id number] Patient Account Number: [id number] Date of Birth/Sex: Treating RN: [birth year] (64 y.o. [name] [name] Primary Care Sole Lengacher: [name] Other Clinician: Referring Bern Fare: Treating Hannan Hutmacher/Extender: [name] [name] in Treatment: 15 Vital Signs Time Taken: 12:56 Temperature (F): 98.2 Height (in): 70 Pulse (bpm): 59 Source: Stated Respiratory Rate (breaths/min): 22 Weight (lbs): 360 Blood Pressure (mmHg): 157/98 Source: Stated Reference Range: 80 - 120 mg / dl Body Mass Index (BMI): 51.6 Electronic Signature(s) Signed: 03/09/2020 5:28:52 PM By:  [name] RN, BSN Entered By: [name] on 03/09/2020 12:57:10

## 2020-03-17 DIAGNOSIS — E1121 Type 2 diabetes mellitus with diabetic nephropathy: Secondary | ICD-10-CM | POA: Diagnosis not present

## 2020-03-17 DIAGNOSIS — N200 Calculus of kidney: Secondary | ICD-10-CM | POA: Diagnosis not present

## 2020-03-17 DIAGNOSIS — I110 Hypertensive heart disease with heart failure: Secondary | ICD-10-CM | POA: Diagnosis not present

## 2020-03-17 DIAGNOSIS — I5033 Acute on chronic diastolic (congestive) heart failure: Secondary | ICD-10-CM | POA: Diagnosis not present

## 2020-03-18 DIAGNOSIS — I110 Hypertensive heart disease with heart failure: Secondary | ICD-10-CM | POA: Diagnosis not present

## 2020-03-18 DIAGNOSIS — Z9981 Dependence on supplemental oxygen: Secondary | ICD-10-CM | POA: Diagnosis not present

## 2020-03-18 DIAGNOSIS — E669 Obesity, unspecified: Secondary | ICD-10-CM | POA: Diagnosis not present

## 2020-03-18 DIAGNOSIS — Z8673 Personal history of transient ischemic attack (TIA), and cerebral infarction without residual deficits: Secondary | ICD-10-CM | POA: Diagnosis not present

## 2020-03-18 DIAGNOSIS — G4733 Obstructive sleep apnea (adult) (pediatric): Secondary | ICD-10-CM | POA: Diagnosis not present

## 2020-03-18 DIAGNOSIS — I872 Venous insufficiency (chronic) (peripheral): Secondary | ICD-10-CM | POA: Diagnosis not present

## 2020-03-18 DIAGNOSIS — L97821 Non-pressure chronic ulcer of other part of left lower leg limited to breakdown of skin: Secondary | ICD-10-CM | POA: Diagnosis not present

## 2020-03-18 DIAGNOSIS — I5032 Chronic diastolic (congestive) heart failure: Secondary | ICD-10-CM | POA: Diagnosis not present

## 2020-03-19 DIAGNOSIS — E1121 Type 2 diabetes mellitus with diabetic nephropathy: Secondary | ICD-10-CM | POA: Diagnosis not present

## 2020-03-19 DIAGNOSIS — I5032 Chronic diastolic (congestive) heart failure: Secondary | ICD-10-CM | POA: Diagnosis not present

## 2020-03-19 DIAGNOSIS — E7800 Pure hypercholesterolemia, unspecified: Secondary | ICD-10-CM | POA: Diagnosis not present

## 2020-03-19 DIAGNOSIS — E1129 Type 2 diabetes mellitus with other diabetic kidney complication: Secondary | ICD-10-CM | POA: Diagnosis not present

## 2020-03-19 DIAGNOSIS — I10 Essential (primary) hypertension: Secondary | ICD-10-CM | POA: Diagnosis not present

## 2020-03-23 ENCOUNTER — Encounter (HOSPITAL_BASED_OUTPATIENT_CLINIC_OR_DEPARTMENT_OTHER): Payer: Medicare Other | Attending: Internal Medicine | Admitting: Internal Medicine

## 2020-03-23 DIAGNOSIS — L03115 Cellulitis of right lower limb: Secondary | ICD-10-CM | POA: Diagnosis not present

## 2020-03-23 DIAGNOSIS — I509 Heart failure, unspecified: Secondary | ICD-10-CM | POA: Diagnosis not present

## 2020-03-23 DIAGNOSIS — Z8673 Personal history of transient ischemic attack (TIA), and cerebral infarction without residual deficits: Secondary | ICD-10-CM | POA: Diagnosis not present

## 2020-03-23 DIAGNOSIS — I87333 Chronic venous hypertension (idiopathic) with ulcer and inflammation of bilateral lower extremity: Secondary | ICD-10-CM | POA: Insufficient documentation

## 2020-03-23 DIAGNOSIS — Z6841 Body Mass Index (BMI) 40.0 and over, adult: Secondary | ICD-10-CM | POA: Diagnosis not present

## 2020-03-23 DIAGNOSIS — L97811 Non-pressure chronic ulcer of other part of right lower leg limited to breakdown of skin: Secondary | ICD-10-CM | POA: Insufficient documentation

## 2020-03-23 DIAGNOSIS — M1990 Unspecified osteoarthritis, unspecified site: Secondary | ICD-10-CM | POA: Insufficient documentation

## 2020-03-23 DIAGNOSIS — G4733 Obstructive sleep apnea (adult) (pediatric): Secondary | ICD-10-CM | POA: Insufficient documentation

## 2020-03-23 DIAGNOSIS — I110 Hypertensive heart disease with heart failure: Secondary | ICD-10-CM | POA: Diagnosis not present

## 2020-03-23 DIAGNOSIS — L97821 Non-pressure chronic ulcer of other part of left lower leg limited to breakdown of skin: Secondary | ICD-10-CM | POA: Diagnosis not present

## 2020-03-23 DIAGNOSIS — I890 Lymphedema, not elsewhere classified: Secondary | ICD-10-CM | POA: Diagnosis not present

## 2020-03-23 NOTE — Progress Notes (Signed)
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 dermatitis and lymphedema. He was last in this clinic in June at that point he had a small superficial area on the right leg and the larger number of small wounds on the left. He has been using silver alginate and and 4-layer compression. He still has [facility] home health care coming out. He tells me he went to [facility] wound care twice. They  healed him out after that he does not think he was actually healed. His wife at the time was in [place] after falling and fracturing her femur by the sound of it. She is currently in a nursing home in [place] He comes into clinic today with large areas of superficial denuded epithelium which is almost circumferential on the right and a large area on the left lateral. His edema control is marginal. He is not in any pain. Past medical history includes congestive heart failure, previous venous ablation, lymphedema and obstructive sleep apnea. He has compression pumps at home but he has been completely noncompliant with this by his own admission. He is not a diabetic. ABIs in our clinic were 1.27 on the right and 1.25 on the left we do not have time for this this afternoon I am and I am not comfortable 10/9; the patient arrives with green drainage under the compression right greater than left. He is not complaining of any pain. He also almost circumferential epithelial loss on the right. He has home health changing the dressing. We are doing  2-week follow-ups. He lives in [place] 10/23; 2-week follow-up. The patient took his antibiotics he seems to have tolerated this well. He has still areas on the right and left calf left more substantially. I think he has some improvement in the epithelialization. He has new wounds on the left dorsal foot today. He says he has been using compression pumps 11/6; two-week follow-up. The patient arrived still with wounds mostly on his lateral lower legs. He has a new area on the right dorsal foot today just in close proximity to his toes. His edema control is marginal. He will not use his compression pumps. He has home health changing the dressings. He tells [place] that his wife is in the hospital in [place] with heart failure. I suspect he is sitting at her bedside for most the day. His legs are probably dependent. 12/4; 1 month follow-up. He arrives with everything on his  legs completely closed. He has some form of external compression garment at home as well as compression pumps. READMISSION 11/25/2019 We discharge this patient on 08/22/2019 with everything on his bilateral lower legs closed. He had an external compression stocking for both legs at home as well as compression pumps although admittedly he has never been compliant with the latter. He states his legs stay closed for about 2 or 3 weeks. He ended up in hospital [facility] from 12/22 through 12/29 with Covid infection. He came home and is gradually been regaining his strength. Currently has bilateral innumerable wounds almost circumferentially on both lower legs in the setting of severe venous inflammation but no current infection. The patient has diastolic heart failure hypertension history of TIA chronic venous ulcers had obstructive sleep apnea although he is not compliant with CPAP. He was never felt to have an arterial issue in this clinic his pedal pulses and ABIs have been normal most recently in September/20 at 1.25 on the right and 1.27 on the left 3/23; he arrives with much less edema in both legs. He has home health changing the dressings. Been using silver alginate. He only has an open area along the wrap line of both legs. The rest of this seems to be closed. 4/6; better edema control in our compression wraps. He has not been using his external compression pumps he tells me because his wife was hospitalized for about 10 days. We have been using silver alginate on the wounds. 4/20; he has good edema control and compression wraps. His wife is back at [facility] he says he has not been using the external compression pumps. Silver alginate to the wounds 5/4; he has good edema control bilaterally. He has not been using the compression pumps he tells [place] his wife is coming home from [place] today. He does not have any open wounds on the right leg continued large collection of small wounds on the left  anterior leg extending medially into laterally nothing much posteriorly on the left. 6/1. He has a smattering of small wounds anteriorly on the left and a larger but superficial wound on the left posterior calf. There is nothing open on the right we have been using silver alginate on the left I will change that the [facility] today 6/22; there is nothing open on his right leg. He has a smattering of small eschars on the left anterior lower leg but no open wounds per se. Somebody applied the compression wrap on the right leg in a very irregular fashion. He has a very tender area on the  right medial ankle. 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 Not surprisingly comes back in with wounds on his bilateral legs at the level of where his external compression garment was tight superiorly. He tells me he is using his pumps once every second day. Objective Constitutional Patient is hypertensive.. Pulse regular and within target range for patient.Marland Kitchen Respirations regular, non-labored and within target range.. Temperature is normal and within the target range for the patient.Marland Kitchen Appears in no distress. Vitals Time Taken: 2:02 PM, Height: 70 in, Source: Stated, Weight: 360 lbs, Source: Stated, BMI: 51.6, Temperature: 98.2 F, Pulse: 77 bpm, Respiratory Rate: 22 breaths/min, Blood Pressure: 160/75 mmHg. Respiratory work of breathing is normal. Cardiovascular Pedal pulses are palpable. Edema present in both extremities. Uncontrolled lymphedema. General Notes: Wound exam; the patient has 2 areas open anteriorly one on the right and one on the left this is where is compression garment stockings were too tight related to poorly controlled edema. Integumentary (Hair, Skin) Wound #65 status is Open. Original cause of wound was Gradually Appeared. The wound is located on the Left,Circumferential Lower Leg. The wound measures 4cm length x  3.8cm width x 0.1cm depth; 11.938cm^2 area and 1.194cm^3 volume. The wound is limited to skin breakdown. There is no tunneling or undermining noted. There is a small amount of serous drainage noted. The wound margin is flat and intact. There is medium (34-66%) red granulation within the wound bed. There is no necrotic tissue within the wound bed. Wound #67 status is Open. Original cause of wound was Gradually Appeared. The wound is located on the Right,Proximal,Posterior Lower Leg. The wound measures 1cm length x 1.5cm width x 0.1cm depth; 1.178cm^2 area and 0.118cm^3 volume. There is Fat Layer (Subcutaneous Tissue) Exposed exposed. There is no tunneling or undermining noted. There is a medium amount of serosanguineous drainage noted. The wound margin is flat and intact. There is large (67-100%) red granulation within the wound bed. There is no necrotic tissue within the wound bed. Wound #68 status is Open. Original cause of wound was Gradually Appeared. The wound is located on the Left,Proximal,Anterior Lower Leg. The wound measures 1.1cm length x 2.1cm width x 0.1cm depth; 1.814cm^2 area and 0.181cm^3 volume. There is Fat Layer (Subcutaneous Tissue) Exposed exposed. There is no tunneling or undermining noted. There is a small amount of serosanguineous drainage noted. The wound margin is flat and intact. There is large (67- 100%) red granulation within the wound bed. There is no necrotic tissue within the wound bed. 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 There was a Four Layer Compression Therapy Procedure by Levan Hurst, RN. Post procedure Diagnosis Wound #65: Same as Pre-Procedure Wound #67 Pre-procedure diagnosis of Wound #67 is a Lymphedema located on the Right,Proximal,Posterior Lower Leg . There was a Four Layer Compression Therapy Procedure by Levan Hurst, RN. Post procedure Diagnosis Wound #67: Same as Pre-Procedure Wound #68 Pre-procedure diagnosis of Wound #68 is a Lymphedema located on the Left,Proximal,Anterior Lower Leg . There was a Four Layer Compression Therapy Procedure by  Levan Hurst, RN. Post procedure Diagnosis Wound #68: Same as Pre-Procedure Plan Follow-up Appointments: Return Appointment in 2 weeks. Dressing Change Frequency: Other: - twice a week Skin Barriers/Peri-Wound Care: Moisturizing lotion Wound Cleansing: May shower with protection. Primary Wound Dressing: Wound #65 Left,Circumferential Lower Leg: Calcium Alginate with Silver Wound #67 Right,Proximal,Posterior Lower Leg: Calcium Alginate with Silver Wound #68 Left,Proximal,Anterior Lower Leg: Calcium Alginate with Silver Secondary Dressing: Wound #65 Left,Circumferential Lower Leg: ABD pad Wound #67 Right,Proximal,Posterior Lower Leg: ABD pad Wound #68 Left,Proximal,Anterior Lower Leg: ABD pad Edema Control: 4 layer compression - Bilateral Elevate legs to the level of the heart or above for 30 minutes daily and/or when sitting, a frequency of: Segmental Compressive Device. - lymphedema pumps twice a day for 1 hour each time Home Health: Wound #65 Left,Circumferential Lower Leg: Continue Home Health skilled nursing for wound care. - amedysis 1. Silver alginate to both wound areas ABDs under 4-layer compression bilaterally 2. This was all caused by not using his compression pumps adequately. Instead of once every second day he should be using them twice a day I have never been able to convince him of  the need for this there is no evidence of heart failure Electronic Signature(s) Signed: 03/23/2020 5:09:59 PM By: Linton Ham MD Entered By: Linton Ham on 03/23/2020 15:26:59 -------------------------------------------------------------------------------- SuperBill Details Patient Name: Date of Service: [name], [name] 03/23/2020 Medical Record Number: [id number] Patient Account Number: [id number] Date of Birth/Sex: Treating RN: January 09, 1956 (64 y.o. [name] [name] Primary Care Provider: Consuello [name] Other Clinician: Referring Provider: Treating Provider/Extender: [name] [name] in Treatment: 17 Diagnosis Coding ICD-10 Codes Code Description [phone] Chronic venous hypertension (idiopathic) with ulcer and inflammation of bilateral lower extremity I89.0 Lymphedema, not elsewhere classified L97.821 Non-pressure chronic ulcer of other part of left lower leg limited to breakdown of skin L97.811 Non-pressure chronic ulcer of other part of right lower leg limited to breakdown of skin L03.115 Cellulitis of right lower limb Facility Procedures CPT4: Code [id number] Description: 81 BILATERAL: Application of multi-layer venous compression system; leg (below knee), including ankle and t. Modifier: Quantity: 1 Physician Procedures : CPT4 Code Description Modifier [id number] - WC PHYS LEVEL 3 - EST PT ICD-10 Diagnosis Description L97.821 Non-pressure chronic ulcer of other part of left lower leg limited to breakdown of skin L97.811 Non-pressure chronic ulcer of other part of  right lower leg limited to breakdown of skin Quantity: 1 Electronic Signature(s) Signed: 03/23/2020 5:09:59 PM By: Linton Ham MD Signed: 03/23/2020 5:26:11 PM By: Levan Hurst RN, BSN Entered By: Levan Hurst on 03/23/2020 17:04:21

## 2020-03-24 DIAGNOSIS — Z8673 Personal history of transient ischemic attack (TIA), and cerebral infarction without residual deficits: Secondary | ICD-10-CM | POA: Diagnosis not present

## 2020-03-24 DIAGNOSIS — E7800 Pure hypercholesterolemia, unspecified: Secondary | ICD-10-CM | POA: Diagnosis not present

## 2020-03-24 DIAGNOSIS — R296 Repeated falls: Secondary | ICD-10-CM | POA: Diagnosis not present

## 2020-03-24 DIAGNOSIS — I10 Essential (primary) hypertension: Secondary | ICD-10-CM | POA: Diagnosis not present

## 2020-03-24 DIAGNOSIS — Z6841 Body Mass Index (BMI) 40.0 and over, adult: Secondary | ICD-10-CM | POA: Diagnosis not present

## 2020-03-24 DIAGNOSIS — E1129 Type 2 diabetes mellitus with other diabetic kidney complication: Secondary | ICD-10-CM | POA: Diagnosis not present

## 2020-03-24 DIAGNOSIS — G4733 Obstructive sleep apnea (adult) (pediatric): Secondary | ICD-10-CM | POA: Diagnosis not present

## 2020-03-24 DIAGNOSIS — Z0001 Encounter for general adult medical examination with abnormal findings: Secondary | ICD-10-CM | POA: Diagnosis not present

## 2020-03-24 DIAGNOSIS — E1121 Type 2 diabetes mellitus with diabetic nephropathy: Secondary | ICD-10-CM | POA: Diagnosis not present

## 2020-03-24 NOTE — Progress Notes (Signed)
[name] [name] ([id number]) , Visit Report for 03/23/2020 Arrival Information Details Patient Name: Date of Service: [name] Ro RDIN W. 03/23/2020 1:45 PM Medical Record Number: [id number] Patient Account Number: [id number] Date of Birth/Sex: Treating RN: 28-Jul-1956 (64 y.o. [name] [name] Primary Care [name] [name]: [name] [name] Other Clinician: Referring [name] [name]: Treating [name] [name]/Extender: [name] [name] in Treatment: 17 Visit Information History Since Last Visit Added or deleted any medications: No Patient Arrived: [name] [name] Any new allergies or adverse reactions: No Arrival Time: 14:00 Had a fall or experienced change in No Accompanied By: self activities of daily living that may affect Transfer Assistance: None risk of falls: Patient Identification Verified: Yes Signs or symptoms of abuse/neglect since last visito No Secondary Verification Process Completed: Yes Hospitalized since last visit: No Patient Requires Transmission-Based Precautions: No Implantable device outside of the clinic excluding No Patient Has Alerts: Yes cellular tissue based products placed in the center Patient Alerts: R ABI = 1.25 since last visit: L ABI = 1.27 Has Dressing in Place as Prescribed: Yes Has Compression in Place as Prescribed: Yes Pain Present Now: No Electronic Signature(s) Signed: 03/23/2020 5:06:40 PM By: [name] RN, BSN Entered By: [name] on 03/23/2020 14:04:22 -------------------------------------------------------------------------------- Compression Therapy Details Patient Name: Date of Service: [name] [name], [name] Susan Moore. 03/23/2020 1:45 PM Medical Record Number: [id number] Patient Account Number: [id number] Date of Birth/Sex: Treating RN: [birth year] (64 y.o. [name] [name] Primary Care [name] [name]: [name] [name] Other Clinician: Referring [name] [name]: Treating [name] [name]/Extender: [name] [name] in Treatment: 17 Compression Therapy  Performed for Wound Assessment: Wound #65 Left,Circumferential Lower Leg Performed By: Clinician [name] Hurst, RN Compression Type: Four Layer Post Procedure Diagnosis Same as Pre-procedure Electronic Signature(s) Signed: 03/23/2020 5:26:11 PM By: [name] Hurst RN, BSN Entered By: [name] [name] on 03/23/2020 15:03:52 -------------------------------------------------------------------------------- Compression Therapy Details Patient Name: Date of Service: [name] Ro RDIN W. 03/23/2020 1:45 PM Medical Record Number: [id number] Patient Account Number: [id number] Date of Birth/Sex: Treating RN: Nov 13, 1955 (64 y.o. [name] [name] Primary Care [name] [name]: [name] [name] Other Clinician: Referring [name] [name]: Treating [name] [name]/Extender: [name] [name] in Treatment: 17 Compression Therapy Performed for Wound Assessment: Wound #67 Right,Proximal,Posterior Lower Leg Performed By: Clinician [name] Hurst, RN Compression Type: Four Layer Post Procedure Diagnosis Same as Pre-procedure Electronic Signature(s) Signed: 03/23/2020 5:26:11 PM By: [name] Hurst RN, BSN Entered By: [name] [name] on 03/23/2020 15:03:52 -------------------------------------------------------------------------------- Compression Therapy Details Patient Name: Date of Service: [name] Ro RDIN W. 03/23/2020 1:45 PM Medical Record Number: [id number] Patient Account Number: [id number] Date of Birth/Sex: Treating RN: 1956-01-10 (64 y.o. [name] [name] Primary Care Zyhir Cappella: [name] [name] Other Clinician: Referring [name] [name]: Treating [name] [name]/Extender: [name] [name] in Treatment: 17 Compression Therapy Performed for Wound Assessment: Wound #68 Left,Proximal,Anterior Lower Leg Performed By: Clinician [name] Hurst, RN Compression Type: Four Layer Post Procedure Diagnosis Same as Pre-procedure Electronic Signature(s) Signed: 03/23/2020 5:26:11 PM By: [name] Hurst RN,  BSN Entered By: [name] [name] on 03/23/2020 15:03:52 -------------------------------------------------------------------------------- Encounter Discharge Information Details Patient Name: Date of Service: [name] [name], [name] W. 03/23/2020 1:45 PM Medical Record Number: [id number] Patient Account Number: [id number] Date of Birth/Sex: Treating RN: 12/16/1955 (63 y.o. [name] [name] Primary Care [name] [name]: [name] [name] Other Clinician: Referring [name] [name]: Treating [name] [name]/Extender: [name] [name] in Treatment: 17 Encounter Discharge Information Items Discharge Condition: Stable Ambulatory Status: Cane Discharge Destination: Home Transportation: Private Auto Accompanied By: self Schedule Follow-up Appointment: Yes Clinical Summary of Care: Patient Declined Electronic Signature(s) Signed: 03/24/2020  5:22:20 PM By: [name] Coria RN Entered By: [name] [name] on 03/23/2020 15:24:13 -------------------------------------------------------------------------------- Lower Extremity Assessment Details Patient Name: Date of Service: [name] Ro RDIN W. 03/23/2020 1:45 PM Medical Record Number: [id number] Patient Account Number: [id number] Date of Birth/Sex: Treating RN: 1956-07-21 (64 y.o. [name] [name] Primary Care [name] [name]: [name] [name] Other Clinician: Referring [name] [name]: Treating [name] [name]/Extender: [name] [name] in Treatment: 17 Edema Assessment Assessed: [name] [name]: No] [name] [name]: No] Edema: [Left: Yes] [Right: Yes] Calf Left: Right: Point of Measurement: 30 cm From Medial Instep 41.8 cm 45.7 cm Ankle Left: Right: Point of Measurement: 13 cm From Medial Instep 28.5 cm 31.2 cm Vascular Assessment Pulses: Dorsalis Pedis Palpable: [Left:Yes] [Right:Yes] Electronic Signature(s) Signed: 03/23/2020 5:06:40 PM By: [name] RN, BSN Entered By: [name] on 03/23/2020  14:13:14 -------------------------------------------------------------------------------- Multi Wound Chart Details Patient Name: Date of Service: [name] [name], [name] RDIN W. 03/23/2020 1:45 PM Medical Record Number: [id number] Patient Account Number: [id number] Date of Birth/Sex: Treating RN: [birth year] (64 y.o. [name] [name] Primary Care Sapir Lavey: [name] [name] Other Clinician: Referring Magdalina Whitehead: Treating Addy Mcmannis/Extender: [name] [name] in Treatment: 17 Vital Signs Height(in): 70 Pulse(bpm): 62 Weight(lbs): 360 Blood Pressure(mmHg): 160/75 Body Mass Index(BMI): 52 Temperature(F): 98.2 Respiratory Rate(breaths/min): 22 Photos: [65:No Photos Left, Circumferential Lower Leg] [67:No Photos Right, Proximal, Posterior Lower Leg] [68:No Photos Left, Proximal, Anterior Lower Leg] Wound Location: [65:Gradually Appeared] [67:Gradually Appeared] [68:Gradually Appeared] Wounding Event: [65:Venous Leg Ulcer] [67:Lymphedema] [68:Lymphedema] Primary Etiology: [65:Lymphedema] [67:N/A] [68:N/A] Secondary Etiology: [65:Lymphedema, Sleep Apnea,] [67:Lymphedema, Sleep Apnea,] [68:Lymphedema, Sleep Apnea,] Comorbid History: [65:Congestive Heart Failure, Hypertension, Peripheral Venous Disease, Osteoarthritis 09/08/2019] [67:Congestive Heart Failure, Hypertension, Peripheral Venous Disease, Osteoarthritis 03/12/2020] [68:Congestive Heart Failure,  Hypertension, Peripheral Venous Disease, Osteoarthritis 03/23/2020] Date Acquired: [65:17] [67:0] [68:0] Weeks of Treatment: [65:Open] [67:Open] [68:Open] Wound Status: [65:Yes] [67:No] [68:No] Clustered Wound: [65:15] [67:N/A] [68:N/A] Clustered Quantity: [65:4x3.8x0.1] [67:1x1.5x0.1] [68:1.1x2.1x0.1] Measurements L x W x D (cm) [65:11.938] [67:1.178] [68:1.814] A (cm) : rea [65:1.194] [67:0.118] [68:0.181] Volume (cm) : [65:98.10%] [67:N/A] [68:N/A] % Reduction in Area: [65:98.10%] [67:N/A] [68:N/A] % Reduction in Volume: [65:Full  Thickness Without Exposed] [67:Full Thickness Without Exposed] [68:Full Thickness Without Exposed] Classification: [65:Support Structures Small] [67:Support Structures Medium] [68:Support Structures Small] Exudate Amount: [65:Serous] [67:Serosanguineous] [68:Serosanguineous] Exudate Type: [65:amber] [67:red, brown] [68:red, brown] Exudate Color: [65:Flat and Intact] [67:Flat and Intact] [68:Flat and Intact] Wound Margin: [65:Medium (34-66%)] [67:Large (67-100%)] [68:Large (67-100%)] Granulation Amount: [65:Red] [67:Red] [68:Red] Granulation Quality: [65:None Present (0%)] [67:None Present (0%)] [68:None Present (0%)] Necrotic Amount: [65:Fascia: No] [67:Fat Layer (Subcutaneous Tissue)] [68:Fat Layer (Subcutaneous Tissue)] Exposed Structures: [65:Fat Layer (Subcutaneous Tissue) Exposed: No Tendon: No Muscle: No Joint: No Bone: No Limited to Skin Breakdown Large (67-100%)] [67:Exposed: Yes Fascia: No Tendon: No Muscle: No Joint: No Bone: No Small (1-33%)] [68:Exposed: Yes Fascia: No Tendon: No  Muscle: No Joint: No Bone: No Small (1-33%)] Epithelialization: [65:Compression Therapy] [67:Compression Therapy] [68:Compression Therapy] Treatment Notes Electronic Signature(s) Signed: 03/23/2020 5:09:59 PM By: Linton Ham MD Signed: 03/23/2020 5:26:11 PM By: [name] Hurst RN, BSN Entered By: Linton Ham on 03/23/2020 15:22:25 -------------------------------------------------------------------------------- Multi-Disciplinary Care [name] Details Patient Name: Date of Service: [name] [name], [name] RDIN W. 03/23/2020 1:45 PM Medical Record Number: [id number] Patient Account Number: [id number] Date of Birth/Sex: Treating RN: November 01, 1955 (64 y.o. [name] [name] Primary Care Bernardino Dowell: [name] [name] Other Clinician: Referring Kalyb Pemble: Treating Tyrus Wilms/Extender: [name] [name] in Treatment: 17 Active Inactive Wound/Skin Impairment Nursing Diagnoses: Knowledge deficit related to  ulceration/compromised skin integrity Goals: Patient/caregiver will verbalize understanding of  skin care regimen Date Initiated: 11/25/2019 Target Resolution Date: 04/23/2020 Goal Status: Active Ulcer/skin breakdown will have a volume reduction of 30% by week 4 Date Initiated: 11/25/2019 Date Inactivated: 01/06/2020 Target Resolution Date: 12/26/2019 Goal Status: Met Ulcer/skin breakdown will have a volume reduction of 50% by week 8 Date Initiated: 01/06/2020 Date Inactivated: 02/17/2020 Target Resolution Date: 02/05/2020 Goal Status: Met Interventions: Assess patient/caregiver ability to obtain necessary supplies Assess patient/caregiver ability to perform ulcer/skin care regimen upon admission and as needed Assess ulceration(s) every visit Notes: Electronic Signature(s) Signed: 03/23/2020 5:26:11 PM By: [name] Hurst RN, BSN Entered By: [name] [name] on 03/23/2020 14:57:23 -------------------------------------------------------------------------------- Pain Assessment Details Patient Name: Date of Service: [name] Ro RDIN W. 03/23/2020 1:45 PM Medical Record Number: [id number] Patient Account Number: [id number] Date of Birth/Sex: Treating RN: 1956-01-05 (64 y.o. [name] [name] Primary Care Deylan Canterbury: [name] [name] Other Clinician: Referring Latrisa Hellums: Treating Wilburn Keir/Extender: [name] [name] in Treatment: 17 Active Problems Location of Pain Severity and Description of Pain Patient Has Paino No Site Locations Rate the pain. Current Pain Level: 0 Pain Management and Medication Current Pain Management: Electronic Signature(s) Signed: 03/23/2020 5:06:40 PM By: [name] RN, BSN Entered By: [name] on 03/23/2020 14:05:00 -------------------------------------------------------------------------------- Patient/Caregiver Education Details Patient Name: Date of Service: [name] [name] 7/6/2021andnbsp1:45 PM Medical Record Number:  [id number] Patient Account Number: [id number] Date of Birth/Gender: Treating RN: 17-Oct-1955 (63 y.o. [name] [name] Primary Care Physician: [name] [name] Other Clinician: Referring Physician: Treating Physician/Extender: [name] [name] in Treatment: 41 Education Assessment Education Provided To: Patient Education Topics Provided Wound/Skin Impairment: Methods: Explain/Verbal Responses: State content correctly [employer]) Signed: 03/23/2020 5:26:11 PM By: [name] Hurst RN, BSN Entered By: [name] [name] on 03/23/2020 14:01:46 -------------------------------------------------------------------------------- Wound Assessment Details Patient Name: Date of Service: [name] Ro RDIN W. 03/23/2020 1:45 PM Medical Record Number: [id number] Patient Account Number: [id number] Date of Birth/Sex: Treating RN: 11-23-1955 (64 y.o. [name] [name] Primary Care Jarvis Sawa: [name] [name] Other Clinician: Referring Zannah Melucci: Treating Adie Vilar/Extender: [name] [name] in Treatment: 17 Wound Status Wound Number: 65 Primary Venous Leg Ulcer Etiology: Wound Location: Left, Circumferential Lower Leg Secondary Lymphedema Wounding Event: Gradually Appeared Etiology: Date Acquired: 09/08/2019 Wound Open Weeks Of Treatment: 17 Status: Clustered Wound: Yes Comorbid Lymphedema, Sleep Apnea, Congestive Heart Failure, History: Hypertension, Peripheral Venous Disease, Osteoarthritis Photos Photo Uploaded By: Mikeal Hawthorne on 03/24/2020 14:08:23 Wound Measurements Length: (cm) 4 Width: (cm) 3.8 Depth: (cm) 0.1 Clustered Quantity: 15 Area: (cm) 11.93 Volume: (cm) 1.194 Wound Description Classification: Full Thickness Without Exposed Support Struct Wound Margin: Flat and Intact Exudate Amount: Small Exudate Type: Serous Exudate Color: amber Foul Odor After Cleansing: Slough/Fibrino % Reduction in Area: 98.1% % Reduction in Volume:  98.1% Epithelialization: Large (67-100%) Tunneling: No 8 Undermining: No ures No No Wound Bed Granulation Amount: Medium (34-66%) Exposed Structure Granulation Quality: Red Fascia Exposed: No Necrotic Amount: None Present (0%) Fat Layer (Subcutaneous Tissue) Exposed: No Tendon Exposed: No Muscle Exposed: No Joint Exposed: No Bone Exposed: No Limited to Skin Breakdown Treatment Notes Wound #65 (Left, Circumferential Lower Leg) 1. Cleanse With Wound Cleanser Soap and water 2. Periwound Care Moisturizing lotion 3. Primary Dressing Applied Calcium Alginate Ag 4. Secondary Dressing Dry Gauze 6. Support Layer Applied 4 layer compression wrap Notes Environmental health practitioner) Signed: 03/23/2020 5:06:40 PM By: [name] RN, BSN Entered By: [name] on 03/23/2020 14:19:20 -------------------------------------------------------------------------------- Wound Assessment Details Patient Name: Date of Service: [name] [name], [name] [place] W. 03/23/2020  1:45 PM Medical Record Number: [id number] Patient Account Number: [id number] Date of Birth/Sex: Treating RN: Aug 05, 1956 (64 y.o. [name] [name] Primary Care Dallana Mavity: [name] [name] Other Clinician: Referring Shonita Rinck: Treating Lauran Romanski/Extender: [name] [name] in Treatment: 17 Wound Status Wound Number: 67 Primary Lymphedema Etiology: Wound Location: Right, Proximal, Posterior Lower Leg Wound Open Wounding Event: Gradually Appeared Status: Date Acquired: 03/12/2020 Comorbid Lymphedema, Sleep Apnea, Congestive Heart Failure, Weeks Of Treatment: 0 History: Hypertension, Peripheral Venous Disease, Osteoarthritis Clustered Wound: No Photos Photo Uploaded By: Mikeal Hawthorne on 03/24/2020 14:08:23 Wound Measurements Length: (cm) 1 Width: (cm) 1.5 Depth: (cm) 0.1 Area: (cm) 1.178 Volume: (cm) 0.118 % Reduction in Area: % Reduction in Volume: Epithelialization: Small (1-33%) Tunneling:  No Undermining: No Wound Description Classification: Full Thickness Without Exposed Support Structures Wound Margin: Flat and Intact Exudate Amount: Medium Exudate Type: Serosanguineous Exudate Color: red, brown Foul Odor After Cleansing: No Slough/Fibrino No Wound Bed Granulation Amount: Large (67-100%) Exposed Structure Granulation Quality: Red Fascia Exposed: No Necrotic Amount: None Present (0%) Fat Layer (Subcutaneous Tissue) Exposed: Yes Tendon Exposed: No Muscle Exposed: No Joint Exposed: No Bone Exposed: No Treatment Notes Wound #67 (Right, Proximal, Posterior Lower Leg) 1. Cleanse With Wound Cleanser Soap and water 2. Periwound Care Moisturizing lotion 3. Primary Dressing Applied Calcium Alginate Ag 4. Secondary Dressing Dry Gauze 6. Support Layer Applied 4 layer compression wrap Notes Environmental health practitioner) Signed: 03/23/2020 5:06:40 PM By: [name] RN, BSN Entered By: [name] on 03/23/2020 14:18:38 -------------------------------------------------------------------------------- Wound Assessment Details Patient Name: Date of Service: [name] [name], [name] RDIN W. 03/23/2020 1:45 PM Medical Record Number: [id number] Patient Account Number: [id number] Date of Birth/Sex: Treating RN: 10-05-1955 (64 y.o. [name] [name] Primary Care Tanishi Nault: [name] [name] Other Clinician: Referring Yuriy Cui: Treating Palmer Fahrner/Extender: [name] [name] in Treatment: 17 Wound Status Wound Number: 68 Primary Lymphedema Etiology: Wound Location: Left, Proximal, Anterior Lower Leg Wound Open Wounding Event: Gradually Appeared Status: Date Acquired: 03/23/2020 Comorbid Lymphedema, Sleep Apnea, Congestive Heart Failure, Weeks Of Treatment: 0 History: Hypertension, Peripheral Venous Disease, Osteoarthritis Clustered Wound: No Photos Photo Uploaded By: Mikeal Hawthorne on 03/24/2020 14:08:49 Wound Measurements Length: (cm) 1.1 Width: (cm)  2.1 Depth: (cm) 0.1 Area: (cm) 1.814 Volume: (cm) 0.181 % Reduction in Area: % Reduction in Volume: Epithelialization: Small (1-33%) Tunneling: No Undermining: No Wound Description Classification: Full Thickness Without Exposed Support Str Wound Margin: Flat and Intact Exudate Amount: Small Exudate Type: Serosanguineous Exudate Color: red, brown uctures Wound Bed Granulation Amount: Large (67-100%) Exposed Structure Granulation Quality: Red Fascia Exposed: No Necrotic Amount: None Present (0%) Fat Layer (Subcutaneous Tissue) Exposed: Yes Tendon Exposed: No Muscle Exposed: No Joint Exposed: No Bone Exposed: No Treatment Notes Wound #68 (Left, Proximal, Anterior Lower Leg) 1. Cleanse With Wound Cleanser Soap and water 2. Periwound Care Moisturizing lotion 3. Primary Dressing Applied Calcium Alginate Ag 4. Secondary Dressing Dry Gauze 6. Support Layer Applied 4 layer compression wrap Notes Environmental health practitioner) Signed: 03/23/2020 5:06:40 PM By: [name] RN, BSN Entered By: [name] on 03/23/2020 14:21:29 -------------------------------------------------------------------------------- [place] Details Patient Name: Date of Service: [name] [name], [name] RDIN W. 03/23/2020 1:45 PM Medical Record Number: [id number] Patient Account Number: [id number] Date of Birth/Sex: Treating RN: [birth year] (64 y.o. [name] [name] Primary Care Braxtin Bamba: [name] [name] Other Clinician: Referring Lyndsay Talamante: Treating Samanvitha [place]/Extender: [name] [name] in Treatment: 17 Vital Signs Time Taken: 14:02 Temperature (F): 98.2 Height (in): 70 Pulse (bpm): 77 Source: Stated Respiratory Rate (breaths/min): 22 Weight (lbs): 360  Blood Pressure (mmHg): 160/75 Source: Stated Reference Range: 80 - 120 mg / dl Body Mass Index (BMI): 51.6 Electronic Signature(s) Signed: 03/23/2020 5:06:40 PM By: [name] RN, BSN Entered By: [name] on  03/23/2020 14:04:50

## 2020-03-26 DIAGNOSIS — I5032 Chronic diastolic (congestive) heart failure: Secondary | ICD-10-CM | POA: Diagnosis not present

## 2020-03-26 DIAGNOSIS — I872 Venous insufficiency (chronic) (peripheral): Secondary | ICD-10-CM | POA: Diagnosis not present

## 2020-03-26 DIAGNOSIS — L97821 Non-pressure chronic ulcer of other part of left lower leg limited to breakdown of skin: Secondary | ICD-10-CM | POA: Diagnosis not present

## 2020-03-26 DIAGNOSIS — I110 Hypertensive heart disease with heart failure: Secondary | ICD-10-CM | POA: Diagnosis not present

## 2020-03-26 DIAGNOSIS — E669 Obesity, unspecified: Secondary | ICD-10-CM | POA: Diagnosis not present

## 2020-03-26 DIAGNOSIS — G4733 Obstructive sleep apnea (adult) (pediatric): Secondary | ICD-10-CM | POA: Diagnosis not present

## 2020-03-30 DIAGNOSIS — I872 Venous insufficiency (chronic) (peripheral): Secondary | ICD-10-CM | POA: Diagnosis not present

## 2020-03-30 DIAGNOSIS — E669 Obesity, unspecified: Secondary | ICD-10-CM | POA: Diagnosis not present

## 2020-03-30 DIAGNOSIS — L97821 Non-pressure chronic ulcer of other part of left lower leg limited to breakdown of skin: Secondary | ICD-10-CM | POA: Diagnosis not present

## 2020-03-30 DIAGNOSIS — I110 Hypertensive heart disease with heart failure: Secondary | ICD-10-CM | POA: Diagnosis not present

## 2020-03-30 DIAGNOSIS — I5032 Chronic diastolic (congestive) heart failure: Secondary | ICD-10-CM | POA: Diagnosis not present

## 2020-03-30 DIAGNOSIS — G4733 Obstructive sleep apnea (adult) (pediatric): Secondary | ICD-10-CM | POA: Diagnosis not present

## 2020-04-01 DIAGNOSIS — Z8673 Personal history of transient ischemic attack (TIA), and cerebral infarction without residual deficits: Secondary | ICD-10-CM | POA: Diagnosis not present

## 2020-04-01 DIAGNOSIS — I110 Hypertensive heart disease with heart failure: Secondary | ICD-10-CM | POA: Diagnosis not present

## 2020-04-01 DIAGNOSIS — I5032 Chronic diastolic (congestive) heart failure: Secondary | ICD-10-CM | POA: Diagnosis not present

## 2020-04-01 DIAGNOSIS — L97821 Non-pressure chronic ulcer of other part of left lower leg limited to breakdown of skin: Secondary | ICD-10-CM | POA: Diagnosis not present

## 2020-04-01 DIAGNOSIS — G4733 Obstructive sleep apnea (adult) (pediatric): Secondary | ICD-10-CM | POA: Diagnosis not present

## 2020-04-01 DIAGNOSIS — I872 Venous insufficiency (chronic) (peripheral): Secondary | ICD-10-CM | POA: Diagnosis not present

## 2020-04-01 DIAGNOSIS — Z9981 Dependence on supplemental oxygen: Secondary | ICD-10-CM | POA: Diagnosis not present

## 2020-04-01 DIAGNOSIS — E669 Obesity, unspecified: Secondary | ICD-10-CM | POA: Diagnosis not present

## 2020-04-02 DIAGNOSIS — G4733 Obstructive sleep apnea (adult) (pediatric): Secondary | ICD-10-CM | POA: Diagnosis not present

## 2020-04-02 DIAGNOSIS — E669 Obesity, unspecified: Secondary | ICD-10-CM | POA: Diagnosis not present

## 2020-04-02 DIAGNOSIS — I110 Hypertensive heart disease with heart failure: Secondary | ICD-10-CM | POA: Diagnosis not present

## 2020-04-02 DIAGNOSIS — I872 Venous insufficiency (chronic) (peripheral): Secondary | ICD-10-CM | POA: Diagnosis not present

## 2020-04-02 DIAGNOSIS — L97821 Non-pressure chronic ulcer of other part of left lower leg limited to breakdown of skin: Secondary | ICD-10-CM | POA: Diagnosis not present

## 2020-04-02 DIAGNOSIS — I5032 Chronic diastolic (congestive) heart failure: Secondary | ICD-10-CM | POA: Diagnosis not present

## 2020-04-06 ENCOUNTER — Encounter (HOSPITAL_BASED_OUTPATIENT_CLINIC_OR_DEPARTMENT_OTHER): Payer: Medicare Other | Admitting: Internal Medicine

## 2020-04-06 DIAGNOSIS — L97811 Non-pressure chronic ulcer of other part of right lower leg limited to breakdown of skin: Secondary | ICD-10-CM | POA: Diagnosis not present

## 2020-04-06 DIAGNOSIS — L97821 Non-pressure chronic ulcer of other part of left lower leg limited to breakdown of skin: Secondary | ICD-10-CM | POA: Diagnosis not present

## 2020-04-06 DIAGNOSIS — I87333 Chronic venous hypertension (idiopathic) with ulcer and inflammation of bilateral lower extremity: Secondary | ICD-10-CM | POA: Diagnosis not present

## 2020-04-06 DIAGNOSIS — I890 Lymphedema, not elsewhere classified: Secondary | ICD-10-CM | POA: Diagnosis not present

## 2020-04-09 DIAGNOSIS — I5032 Chronic diastolic (congestive) heart failure: Secondary | ICD-10-CM | POA: Diagnosis not present

## 2020-04-09 DIAGNOSIS — L97821 Non-pressure chronic ulcer of other part of left lower leg limited to breakdown of skin: Secondary | ICD-10-CM | POA: Diagnosis not present

## 2020-04-09 DIAGNOSIS — E669 Obesity, unspecified: Secondary | ICD-10-CM | POA: Diagnosis not present

## 2020-04-09 DIAGNOSIS — I110 Hypertensive heart disease with heart failure: Secondary | ICD-10-CM | POA: Diagnosis not present

## 2020-04-09 DIAGNOSIS — I872 Venous insufficiency (chronic) (peripheral): Secondary | ICD-10-CM | POA: Diagnosis not present

## 2020-04-09 DIAGNOSIS — G4733 Obstructive sleep apnea (adult) (pediatric): Secondary | ICD-10-CM | POA: Diagnosis not present

## 2020-04-09 NOTE — Progress Notes (Signed)
[name] [name] ([id number]) , Visit Report for 04/06/2020 Arrival Information Details Patient Name: Date of Service: [name] Ro RDIN W. 04/06/2020 12:30 PM Medical Record Number: [id number] Patient Account Number: [id number] Date of Birth/Sex: Treating RN: 10-11-1955 (64 y.o. [name] [name] Primary Care [name] [name]: [name] [name] Other Clinician: Referring Palma Buster: Treating [name] [name]/Extender: [name] [name] in Treatment: 49 Visit Information History Since Last Visit Added or deleted any medications: No Patient Arrived: [name] [name] Any new allergies or adverse reactions: No Arrival Time: 12:59 Had a fall or experienced change in No Accompanied By: alone activities of daily living that may affect Transfer Assistance: None risk of falls: Patient Identification Verified: Yes Signs or symptoms of abuse/neglect since last visito No Secondary Verification Process Completed: Yes Hospitalized since last visit: No Patient Requires Transmission-Based Precautions: No Implantable device outside of the clinic excluding No Patient Has Alerts: Yes cellular tissue based products placed in the center Patient Alerts: R ABI = 1.25 since last visit: L ABI = 1.27 Has Dressing in Place as Prescribed: Yes Has Compression in Place as Prescribed: Yes Pain Present Now: No Electronic Signature(s) Signed: 04/08/2020 5:02:14 PM By: [name] Hurst RN, BSN Entered By: [name] [name] on 04/06/2020 13:00:34 -------------------------------------------------------------------------------- Compression Therapy Details Patient Name: Date of Service: [name] Ro RDIN W. 04/06/2020 12:30 PM Medical Record Number: [id number] Patient Account Number: [id number] Date of Birth/Sex: Treating RN: 1956-02-04 (63 y.o. [name] [name] Primary Care [name] [name]: [name] [name] Other Clinician: Referring Kalayah Leske: Treating [name] [name]/Extender: [name] [name] in Treatment: 19 Compression Therapy  Performed for Wound Assessment: Wound #65 Left,Circumferential Lower Leg Performed By: Clinician [name] Coria, RN Post Procedure Diagnosis Same as Pre-procedure Electronic Signature(s) Signed: 04/09/2020 5:42:58 PM By: [name] Coria RN Entered By: [name] [name] on 04/06/2020 13:25:48 -------------------------------------------------------------------------------- Compression Therapy Details Patient Name: Date of Service: [name] Ro RDIN W. 04/06/2020 12:30 PM Medical Record Number: [id number] Patient Account Number: [id number] Date of Birth/Sex: Treating RN: 13-Jul-1956 (63 y.o. [name] [name] Primary Care Braidan Ricciardi: [name] [name] Other Clinician: Referring [name] [name]: Treating [name] [name]/Extender: [name] [name] in Treatment: 19 Compression Therapy Performed for Wound Assessment: Wound #68 Left,Proximal,Anterior Lower Leg Performed By: Clinician [name] Coria, RN Post Procedure Diagnosis Same as Pre-procedure Electronic Signature(s) Signed: 04/09/2020 5:42:58 PM By: [name] Coria RN Entered By: [name] [name] on 04/06/2020 13:25:48 -------------------------------------------------------------------------------- Compression Therapy Details Patient Name: Date of Service: [name] Ro RDIN W. 04/06/2020 12:30 PM Medical Record Number: [id number] Patient Account Number: [id number] Date of Birth/Sex: Treating RN: 08/08/1956 (63 y.o. [name] [name] Primary Care [name] [name]: [name] [name] Other Clinician: Referring [name] [name]: Treating [name] [name]/Extender: [name] [name] in Treatment: 19 Compression Therapy Performed for Wound Assessment: Wound #69 Right,Lateral Lower Leg Performed By: Clinician [name] Coria, RN Post Procedure Diagnosis Same as Pre-procedure Electronic Signature(s) Signed: 04/09/2020 5:42:58 PM By: [name] Coria RN Entered By: [name] [name] on 04/06/2020  13:25:48 -------------------------------------------------------------------------------- Encounter Discharge Information Details Patient Name: Date of Service: [name] [name], [name] RDIN W. 04/06/2020 12:30 PM Medical Record Number: [id number] Patient Account Number: [id number] Date of Birth/Sex: Treating RN: October 30, 1955 (64 y.o. [name] [name] Primary Care [name] [name]: [name] [name] Other Clinician: Referring [name] [name]: Treating [name] [name]/Extender: [name] [name] in Treatment: 19 Encounter Discharge Information Items Discharge Condition: Stable Ambulatory Status: Cane Discharge Destination: Home Transportation: Private Auto Accompanied By: self Schedule Follow-up Appointment: Yes Clinical Summary of Care: Patient Declined Electronic Signature(s) Signed: 04/06/2020 5:24:17 PM By: Kela Millin Entered By: Kela Millin on 04/06/2020 14:12:09 -------------------------------------------------------------------------------- Lower Extremity  Assessment Details Patient Name: Date of Service: [name] [name]. 04/06/2020 12:30 PM Medical Record Number: [id number] Patient Account Number: [id number] Date of Birth/Sex: Treating RN: [birth year] (64 y.o. [name] [name] Primary Care [name] [name]: [name] [name] Other Clinician: Referring [name] [name]: Treating Cael [name]/Extender: [name] [name] in Treatment: 19 Edema Assessment Assessed: [name] [name]: No] [name] [name]: No] Edema: [Left: Yes] [Right: Yes] Calf Left: Right: Point of Measurement: 30 cm From Medial Instep 41.8 cm 45.7 cm Ankle Left: Right: Point of Measurement: 13 cm From Medial Instep 28.5 cm 31.2 cm Vascular Assessment Pulses: Dorsalis Pedis Palpable: [Left:Yes] [Right:Yes] Electronic Signature(s) Signed: 04/08/2020 5:02:14 PM By: [name] Hurst RN, BSN Entered By: [name] [name] on 04/06/2020 13:13:07 -------------------------------------------------------------------------------- Multi Wound  Chart Details Patient Name: Date of Service: [name] [name], [place] W. 04/06/2020 12:30 PM Medical Record Number: [id number] Patient Account Number: [id number] Date of Birth/Sex: Treating RN: July 10, 1956 (63 y.o. [name] [name]) [name] [name] Primary Care [name] [name]: [name] [name] Other Clinician: Referring [name] [name]: Treating Kejuan Bekker/Extender: [name] [name] in Treatment: 19 Vital Signs Height(in): 70 Pulse(bpm): 90 Weight(lbs): 360 Blood Pressure(mmHg): 164/82 Body Mass Index(BMI): 52 Temperature(F): 97.8 Respiratory Rate(breaths/min): 20 Photos: [65:No Photos Left, Circumferential Lower Leg] [67:No Photos Right, Proximal, Posterior Lower Leg] [68:No Photos Left, Proximal, Anterior Lower Leg] Wound Location: [65:Gradually Appeared] [67:Gradually Appeared] [68:Gradually Appeared] Wounding Event: [65:Venous Leg Ulcer] [67:Lymphedema] [68:Lymphedema] Primary Etiology: [65:Lymphedema] [67:N/A] [68:N/A] Secondary Etiology: [65:Lymphedema, Sleep Apnea,] [67:Lymphedema, Sleep Apnea,] [68:Lymphedema, Sleep Apnea,] Comorbid History: [65:Congestive Heart Failure, Hypertension, Peripheral Venous Disease, Osteoarthritis 09/08/2019] [67:Congestive Heart Failure, Hypertension, Peripheral Venous Disease, Osteoarthritis 03/12/2020] [68:Congestive Heart Failure,  Hypertension, Peripheral Venous Disease, Osteoarthritis 03/23/2020] Date Acquired: [65:19] [67:2] [68:2] Weeks of Treatment: [65:Open] [67:Healed - Epithelialized] [68:Open] Wound Status: [65:Yes] [67:No] [68:No] Clustered Wound: [65:5] [67:N/A] [68:N/A] Clustered Quantity: [65:3x14x0.1] [67:0x0x0] [68:1x0.7x0.1] Measurements L x W x D (cm) [65:32.987] [67:0] [68:0.55] A (cm) : rea [65:3.299] [67:0] [68:0.055] Volume (cm) : [65:94.60%] [67:100.00%] [68:69.70%] % Reduction in Area: [65:94.60%] [67:100.00%] [68:69.60%] % Reduction in Volume: [65:Full Thickness Without Exposed] [67:Full Thickness Without Exposed] [68:Full Thickness Without  Exposed] Classification: [65:Support Structures Medium] [67:Support Structures None Present] [68:Support Structures Small] Exudate Amount: [65:Serosanguineous] [67:N/A] [68:Serosanguineous] Exudate Type: [65:red, brown] [67:N/A] [68:red, brown] Exudate Color: [65:Flat and Intact] [67:Flat and Intact] [68:Flat and Intact] Wound Margin: [65:Large (67-100%)] [67:None Present (0%)] [68:Large (67-100%)] Granulation Amount: [65:Red] [67:N/A] [68:Red] Granulation Quality: [65:None Present (0%)] [67:None Present (0%)] [68:None Present (0%)] Necrotic Amount: [65:Fat Layer (Subcutaneous Tissue)] [67:Fascia: No] [68:Fat Layer (Subcutaneous Tissue)] Exposed Structures: [65:Exposed: Yes Fascia: No Tendon: No Muscle: No Joint: No Bone: No Large (67-100%)] [67:Fat Layer (Subcutaneous Tissue) Exposed: No Tendon: No Muscle: No Joint: No Bone: No Large (67-100%)] [68:Exposed: Yes Fascia: No Tendon: No Muscle: No Joint: No  Bone: No Medium (34-66%)] Epithelialization: [65:Compression Therapy] [67:N/A] [68:Compression [place] Wound Number: 17 N/A N/A Photos: No Photos N/A N/A Right, Lateral Lower Leg N/A N/A Wound Location: Gradually Appeared N/A N/A Wounding Event: Venous Leg Ulcer N/A N/A Primary Etiology: N/A N/A N/A Secondary Etiology: Lymphedema, Sleep Apnea, N/A N/A Comorbid History: Congestive Heart Failure, Hypertension, Peripheral Venous Disease, Osteoarthritis 04/06/2020 N/A N/A Date Acquired: 0 N/A N/A Weeks of Treatment: Open N/A N/A Wound Status: No N/A N/A Clustered Wound: N/A N/A N/A Clustered Quantity: 0.5x0.5x0.1 N/A N/A Measurements L x W x D (cm) 0.196 N/A N/A A (cm) : rea 0.02 N/A N/A Volume (cm) : N/A N/A N/A % Reduction in Area: N/A N/A N/A % Reduction in Volume: Full Thickness Without Exposed N/A N/A Classification:  Support Structures Small N/A N/A Exudate Amount: Serosanguineous N/A N/A Exudate Type: red, brown N/A N/A Exudate Color: Flat and Intact  N/A N/A Wound Margin: Large (67-100%) N/A N/A Granulation Amount: Red N/A N/A Granulation Quality: None Present (0%) N/A N/A Necrotic Amount: Fat Layer (Subcutaneous Tissue) N/A N/A Exposed Structures: Exposed: Yes Fascia: No Tendon: No Muscle: No Joint: No Bone: No None N/A N/A Epithelialization: Compression Therapy N/A N/A Procedures Performed: Treatment Notes Electronic Signature(s) Signed: 04/06/2020 6:00:01 PM By: Linton Ham MD Signed: 04/09/2020 5:42:58 PM By: [name] Coria RN Entered By: Linton Ham on 04/06/2020 13:56:04 -------------------------------------------------------------------------------- Multi-Disciplinary Care [name] Details Patient Name: Date of Service: [name] [name], [name] [place] W. 04/06/2020 12:30 PM Medical Record Number: [id number] Patient Account Number: [id number] Date of Birth/Sex: Treating RN: [birth year] (63 y.o. [name] [name] Primary Care Tecumseh Yeagley: [name] [name] Other Clinician: Referring Viliami Bracco: Treating Krrish Freund/Extender: [name] [name] in Treatment: 19 Active Inactive Wound/Skin Impairment Nursing Diagnoses: Knowledge deficit related to ulceration/compromised skin integrity Goals: Patient/caregiver will verbalize understanding of skin care regimen Date Initiated: 11/25/2019 Target Resolution Date: 04/23/2020 Goal Status: Active Ulcer/skin breakdown will have a volume reduction of 30% by week 4 Date Initiated: 11/25/2019 Date Inactivated: 01/06/2020 Target Resolution Date: 12/26/2019 Goal Status: Met Ulcer/skin breakdown will have a volume reduction of 50% by week 8 Date Initiated: 01/06/2020 Date Inactivated: 02/17/2020 Target Resolution Date: 02/05/2020 Goal Status: Met Interventions: Assess patient/caregiver ability to obtain necessary supplies Assess patient/caregiver ability to perform ulcer/skin care regimen upon admission and as needed Assess ulceration(s) every visit Notes: Electronic Signature(s) Signed:  04/09/2020 5:42:58 PM By: [name] Coria RN Entered By: [name] [name] on 04/06/2020 12:44:45 -------------------------------------------------------------------------------- Pain Assessment Details Patient Name: Date of Service: [name] [name]. 04/06/2020 12:30 PM Medical Record Number: [id number] Patient Account Number: [id number] Date of Birth/Sex: Treating RN: 1956-07-09 (64 y.o. [name] [name] Primary Care Ephraim Reichel: [name] [name] Other Clinician: Referring Celester Morgan: Treating Ashleigh Luckow/Extender: [name] [name] in Treatment: 19 Active Problems Location of Pain Severity and Description of Pain Patient Has Paino No Site Locations Pain Management and Medication Current Pain Management: Electronic Signature(s) Signed: 04/08/2020 5:02:14 PM By: [name] Hurst RN, BSN Entered By: [name] [name] on 04/06/2020 13:01:42 -------------------------------------------------------------------------------- Patient/Caregiver Education Details Patient Name: Date of Service: [name] [name] 7/20/2021andnbsp12:30 PM Medical Record Number: [id number] Patient Account Number: [id number] Date of Birth/Gender: Treating RN: 10-31-1955 (63 y.o. [name] [name] Primary Care Physician: [name] [name] Other Clinician: Referring Physician: Treating Physician/Extender: [name] [name] in Treatment: 19 Education Assessment Education Provided To: Patient Education Topics Provided Wound/Skin Impairment: Methods: Explain/Verbal Responses: State content correctly Electronic Signature(s) Signed: 04/09/2020 5:42:58 PM By: [name] Coria RN Entered By: [name] [name] on 04/06/2020 12:45:16 -------------------------------------------------------------------------------- Wound Assessment Details Patient Name: Date of Service: [name] [name]. 04/06/2020 12:30 PM Medical Record Number: [id number] Patient Account Number: [id number] Date of Birth/Sex: Treating  RN: 1956-01-12 (64 y.o. [name] [name] Primary Care Pearly Apachito: [name] [name] Other Clinician: Referring Hollee Fate: Treating Betania Dizon/Extender: [name] [name] in Treatment: 19 Wound Status Wound Number: 62 Primary Venous Leg Ulcer Etiology: Wound Location: Left, Circumferential Lower Leg Secondary Lymphedema Wounding Event: Gradually Appeared Etiology: Date Acquired: 09/08/2019 Wound Open Weeks Of Treatment: 19 Status: Clustered Wound: Yes Comorbid Lymphedema, Sleep Apnea, Congestive Heart Failure, History: Hypertension, Peripheral Venous Disease, Osteoarthritis Photos Photo Uploaded By: Mikeal Hawthorne on 04/07/2020 13:41:21 Wound Measurements Length: (cm) 3 Width: (cm) 14 Depth: (cm) 0.1 Clustered Quantity: 5 Area: (cm) 32 Volume: (cm)  3. % Reduction in Area: 94.6% % Reduction in Volume: 94.6% Epithelialization: Large (67-100%) Tunneling: No .987 Undermining: No 299 Wound Description Classification: Full Thickness Without Exposed Support Structures Wound Margin: Flat and Intact Exudate Amount: Medium Exudate Type: Serosanguineous Exudate Color: red, brown Foul Odor After Cleansing: No Slough/Fibrino No Wound Bed Granulation Amount: Large (67-100%) Exposed Structure Granulation Quality: Red Fascia Exposed: No Necrotic Amount: None Present (0%) Fat Layer (Subcutaneous Tissue) Exposed: Yes Tendon Exposed: No Muscle Exposed: No Joint Exposed: No Bone Exposed: No Treatment Notes Wound #65 (Left, Circumferential Lower Leg) 1. Cleanse With Wound Cleanser Soap and water 2. Periwound Care TCA Cream 3. Primary Dressing Applied Calcium Alginate Ag 4. Secondary Dressing ABD Pad 6. Support Layer Applied 4 layer compression Water quality scientist) Signed: 04/08/2020 5:02:14 PM By: [name] Hurst RN, BSN Entered By: [name] [name] on 04/06/2020  13:14:53 -------------------------------------------------------------------------------- Wound Assessment Details Patient Name: Date of Service: [name] Ro RDIN W. 04/06/2020 12:30 PM Medical Record Number: [id number] Patient Account Number: [id number] Date of Birth/Sex: Treating RN: 06-26-1956 (64 y.o. [name] [name] Primary Care Clora Ohmer: [name] [name] Other Clinician: Referring Samrat Hayward: Treating Nelva Hauk/Extender: [name] [name] in Treatment: 19 Wound Status Wound Number: 67 Primary Lymphedema Etiology: Wound Location: Right, Proximal, Posterior Lower Leg Wound Healed - Epithelialized Wounding Event: Gradually Appeared Status: Date Acquired: 03/12/2020 Comorbid Lymphedema, Sleep Apnea, Congestive Heart Failure, Weeks Of Treatment: 2 History: Hypertension, Peripheral Venous Disease, Osteoarthritis Clustered Wound: No Photos Photo Uploaded By: Mikeal Hawthorne on 04/07/2020 13:50:50 Wound Measurements Length: (cm) Width: (cm) Depth: (cm) Area: (cm) Volume: (cm) 0 % Reduction in Area: 100% 0 % Reduction in Volume: 100% 0 Epithelialization: Large (67-100%) 0 Tunneling: No 0 Undermining: No Wound Description Classification: Full Thickness Without Exposed Support Structures Wound Margin: Flat and Intact Exudate Amount: None Present Foul Odor After Cleansing: No Slough/Fibrino No Wound Bed Granulation Amount: None Present (0%) Exposed Structure Necrotic Amount: None Present (0%) Fascia Exposed: No Fat Layer (Subcutaneous Tissue) Exposed: No Tendon Exposed: No Muscle Exposed: No Joint Exposed: No Bone Exposed: No Electronic Signature(s) Signed: 04/08/2020 5:02:14 PM By: [name] Hurst RN, BSN Entered By: [name] [name] on 04/06/2020 13:15:07 -------------------------------------------------------------------------------- Wound Assessment Details Patient Name: Date of Service: [name] Ro [place] W. 04/06/2020 12:30 PM Medical Record Number:  [id number] Patient Account Number: [id number] Date of Birth/Sex: Treating RN: [birth year] (64 y.o. [name] [name] Primary Care Sharel Behne: [name] [name] Other Clinician: Referring Kariem Wolfson: Treating Zykiria Bruening/Extender: [name] [name] in Treatment: 19 Wound Status Wound Number: 68 Primary Lymphedema Etiology: Wound Location: Left, Proximal, Anterior Lower Leg Wound Open Wounding Event: Gradually Appeared Status: Date Acquired: 03/23/2020 Comorbid Lymphedema, Sleep Apnea, Congestive Heart Failure, Weeks Of Treatment: 2 History: Hypertension, Peripheral Venous Disease, Osteoarthritis Clustered Wound: No Photos Photo Uploaded By: Mikeal Hawthorne on 04/07/2020 13:41:22 Wound Measurements Length: (cm) 1 Width: (cm) 0.7 Depth: (cm) 0.1 Area: (cm) 0.55 Volume: (cm) 0.055 % Reduction in Area: 69.7% % Reduction in Volume: 69.6% Epithelialization: Medium (34-66%) Tunneling: No Undermining: No Wound Description Classification: Full Thickness Without Exposed Support Str Wound Margin: Flat and Intact Exudate Amount: Small Exudate Type: Serosanguineous Exudate Color: red, brown uctures Wound Bed Granulation Amount: Large (67-100%) Exposed Structure Granulation Quality: Red Fascia Exposed: No Necrotic Amount: None Present (0%) Fat Layer (Subcutaneous Tissue) Exposed: Yes Tendon Exposed: No Muscle Exposed: No Joint Exposed: No Bone Exposed: No Treatment Notes Wound #68 (Left, Proximal, Anterior Lower Leg) 1. Cleanse With Wound Cleanser Soap and water 2. Periwound Care TCA Cream 3.  Primary Dressing Applied Calcium Alginate Ag 4. Secondary Dressing ABD Pad 6. Support Layer Applied 4 layer compression Water quality scientist) Signed: 04/08/2020 5:02:14 PM By: [name] Hurst RN, BSN Entered By: [name] [name] on 04/06/2020 13:15:26 -------------------------------------------------------------------------------- Wound Assessment Details Patient  Name: Date of Service: [name] Ro RDIN W. 04/06/2020 12:30 PM Medical Record Number: [id number] Patient Account Number: [id number] Date of Birth/Sex: Treating RN: 22-Apr-1956 (64 y.o. [name] [name] Primary Care Amai Cappiello: [name] [name] Other Clinician: Referring Nedim Oki: Treating Alessandro Griep/Extender: [name] [name] in Treatment: 19 Wound Status Wound Number: 69 Primary Venous Leg Ulcer Etiology: Wound Location: Right, Lateral Lower Leg Wound Open Wounding Event: Gradually Appeared Status: Date Acquired: 04/06/2020 Comorbid Lymphedema, Sleep Apnea, Congestive Heart Failure, Weeks Of Treatment: 0 History: Hypertension, Peripheral Venous Disease, Osteoarthritis Clustered Wound: No Photos Photo Uploaded By: Mikeal Hawthorne on 04/07/2020 13:21:13 Wound Measurements Length: (cm) 0.5 Width: (cm) 0.5 Depth: (cm) 0.1 Area: (cm) 0.196 Volume: (cm) 0.02 % Reduction in Area: % Reduction in Volume: Epithelialization: None Tunneling: No Undermining: No Wound Description Classification: Full Thickness Without Exposed Support Structu Wound Margin: Flat and Intact Exudate Amount: Small Exudate Type: Serosanguineous Exudate Color: red, brown res Foul Odor After Cleansing: No Slough/Fibrino No Wound Bed Granulation Amount: Large (67-100%) Exposed Structure Granulation Quality: Red Fascia Exposed: No Necrotic Amount: None Present (0%) Fat Layer (Subcutaneous Tissue) Exposed: Yes Tendon Exposed: No Muscle Exposed: No Joint Exposed: No Bone Exposed: No Treatment Notes Wound #69 (Right, Lateral Lower Leg) 1. Cleanse With Wound Cleanser Soap and water 2. Periwound Care TCA Cream 3. Primary Dressing Applied Calcium Alginate Ag 4. Secondary Dressing ABD Pad 6. Support Layer Applied 4 layer compression Water quality scientist) Signed: 04/08/2020 5:02:14 PM By: [name] Hurst RN, BSN Entered By: [name] [name] on 04/06/2020  13:16:26 -------------------------------------------------------------------------------- [place] Details Patient Name: Date of Service: [name] [name], [name] [place] W. 04/06/2020 12:30 PM Medical Record Number: [id number] Patient Account Number: [id number] Date of Birth/Sex: Treating RN: 05-21-1956 (64 y.o. [name] [name] Primary Care Antonietta Lansdowne: [name] [name] Other Clinician: Referring Micole Delehanty: Treating La Shehan/Extender: [name] [name] in Treatment: 19 Vital Signs Time Taken: 13:00 Temperature (F): 97.8 Height (in): 70 Pulse (bpm): 55 Weight (lbs): 360 Respiratory Rate (breaths/min): 20 Body Mass Index (BMI): 51.6 Blood Pressure (mmHg): 164/82 Reference Range: 80 - 120 mg / dl Electronic Signature(s) Signed: 04/08/2020 5:02:14 PM By: [name] Hurst RN, BSN Entered By: [name] [name] on 04/06/2020 13:01:27

## 2020-04-09 NOTE — Progress Notes (Signed)
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 left leg. He is not using his compression pumps. He has home health changing his dressings READMISSION 06/13/2019 Mr. mcmillon is now a 64 year old man. He has a long history of chronic venous insufficiency with chronic stasis dermatitis and lymphedema. He was last in this clinic in June at that point he had a small superficial area on the right leg and the larger number of  small wounds on the left. He has been using silver alginate and and 4-layer compression. He still has [facility] home health care coming out. He tells me he went to [facility] wound care twice. They healed him out after that he does not think he was actually healed. His wife at the time was in [place] after falling and fracturing her femur by the sound of it. She is currently in a nursing home in [place] He comes into clinic today with large areas of superficial denuded epithelium which is almost circumferential on the right and a large area on the left lateral. His edema control is marginal. He is not in any pain. Past medical history includes congestive heart failure, previous venous ablation, lymphedema and obstructive sleep apnea. He has compression pumps at home but he has been completely noncompliant with this by his own admission. He is not a diabetic. ABIs in our clinic were 1.27 on the right and 1.25 on the left we do not have time for this this afternoon I am and I am not comfortable 10/9;  the patient arrives with green drainage under the compression right greater than left. He is not complaining of any pain. He also almost circumferential epithelial loss on the right. He has home health changing the dressing. We are doing 2-week follow-ups. He lives in [place] 10/23; 2-week follow-up. The patient took his antibiotics he seems to have tolerated this well. He has still areas on the right and left calf left more substantially. I think he has some improvement in the epithelialization. He has new wounds on the left dorsal foot today. He says he has been using compression pumps 11/6; two-week follow-up. The patient arrived still with wounds mostly on his lateral lower legs. He has a new area on the right dorsal foot today just in close proximity to his toes. His edema control is marginal. He will not use his compression pumps. He has home health changing the dressings. He tells [place] that his wife is in the  hospital in [place] with heart failure. I suspect he is sitting at her bedside for most the day. His legs are probably dependent. 12/4; 1 month follow-up. He arrives with everything on his legs completely closed. He has some form of external compression garment at home as well as compression pumps. READMISSION 11/25/2019 We discharge this patient on 08/22/2019 with everything on his bilateral lower legs closed. He had an external compression stocking for both legs at home as well as compression pumps although admittedly he has never been compliant with the latter. He states his legs stay closed for about 2 or 3 weeks. He ended up in hospital at [facility] from 12/22 through 12/29 with Covid infection. He came home and is gradually been regaining his strength. Currently has bilateral innumerable wounds almost circumferentially on both lower legs in the setting of severe venous inflammation but no current infection. The patient has diastolic heart failure hypertension history of TIA chronic venous ulcers had obstructive sleep apnea although he is not compliant with CPAP. He was never felt to have an arterial issue in this clinic his pedal pulses and ABIs have been normal most recently in September/20 at 1.25 on the right and 1.27 on the left 3/23; he arrives with much less edema in both legs. He has home health changing the dressings. Been using silver alginate. He only has an open area along the wrap line of both legs. The rest of this seems to be closed. 4/6; better edema control in our compression wraps. He has not been using his external compression pumps he tells me because his wife was hospitalized for about 10 days. We have been using silver alginate on the wounds. 4/20; he has good edema control and compression wraps. His wife is back at [facility] he says he has not been using the external compression pumps. Silver alginate to the wounds 5/4; he has good edema control bilaterally. He has  not been using the compression pumps he tells [place] his wife is coming home from [place] today. He does not have any open wounds on the right leg continued large collection of small wounds on the left anterior leg extending medially into laterally nothing much posteriorly on the left. 6/1. He has a smattering of small wounds anteriorly on the left and a larger but superficial wound on the left posterior calf. There is nothing open on the right we have been using silver alginate on the left I will change that the [facility] today 6/22; there is nothing open on his right leg. He has  a smattering of small eschars on the left anterior lower leg but no open wounds per se. Somebody applied the compression wrap on the right leg in a very irregular fashion. He has a very tender area on the right medial ankle. 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 Not surprisingly comes back in with wounds on his bilateral legs at the level of where his external compression garment was tight superiorly. He tells me he is using his pumps once every second day. 7/20; 2-week follow-up the patient has not been using his compression pumps his wife was transferred to a new nursing home. He has 1 wound on the medial left lower leg and a small area on the right lateral Objective Constitutional Patient is hypertensive.. Pulse regular and within target range for patient.Marland Kitchen Respirations regular, non-labored and within target range.. Temperature is normal and within the target range for the patient.Marland Kitchen Appears in no distress. Vitals Time Taken: 1:00 PM, Height: 70 in, Weight: 360 lbs, BMI: 51.6, Temperature: 97.8 F, Pulse: 55 bpm, Respiratory Rate: 20 breaths/min, Blood Pressure: 164/82 mmHg. Respiratory work of breathing is normal. Cardiovascular Pedal pulses are palpable. Surprisingly his edema control is reasonably good. Psychiatric appears at normal  baseline. General Notes: Wound exam; large area on the left medial and a small area on the right lateral. In spite of not using his compression pumps his edema is quite good. Integumentary (Hair, Skin) Wound #65 status is Open. Original cause of wound was Gradually Appeared. The wound is located on the Left,Circumferential Lower Leg. The wound measures 3cm length x 14cm width x 0.1cm depth; 32.987cm^2 area and 3.299cm^3 volume. There is Fat Layer (Subcutaneous Tissue) Exposed exposed. There is no tunneling or undermining noted. There is a medium amount of serosanguineous drainage noted. The wound margin is flat and intact. There is large (67-100%) red granulation within the wound bed. There is no necrotic tissue within the wound bed. Wound #67 status is Healed - Epithelialized. Original cause of wound was Gradually Appeared. The wound is located on the Right,Proximal,Posterior Lower Leg. The wound measures 0cm length x 0cm width x 0cm depth; 0cm^2 area and 0cm^3 volume. There is no tunneling or undermining noted. There is a none present amount of drainage noted. The wound margin is flat and intact. There is no granulation within the wound bed. There is no necrotic tissue within the wound bed. Wound #68 status is Open. Original cause of wound was Gradually Appeared. The wound is located on the Left,Proximal,Anterior Lower Leg. The wound measures 1cm length x 0.7cm width x 0.1cm depth; 0.55cm^2 area and 0.055cm^3 volume. There is Fat Layer (Subcutaneous Tissue) Exposed exposed. There is no tunneling or undermining noted. There is a small amount of serosanguineous drainage noted. The wound margin is flat and intact. There is large (67-100%) red granulation within the wound bed. There is no necrotic tissue within the wound bed. Wound #69 status is Open. Original cause of wound was Gradually Appeared. The wound is located on the Right,Lateral Lower Leg. The wound measures 0.5cm length x 0.5cm width x  0.1cm depth; 0.196cm^2 area and 0.02cm^3 volume. There is Fat Layer (Subcutaneous Tissue) Exposed exposed. There is no tunneling or undermining noted. There is a small amount of serosanguineous drainage noted. The wound margin is flat and intact. There is large (67-100%) red granulation within the wound bed. There is no necrotic tissue within the wound bed. 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 There was a Compression Therapy Procedure by [name], RN. Post procedure Diagnosis Wound #65: Same as Pre-Procedure Wound #68 Pre-procedure diagnosis of Wound #68 is a Lymphedema located on the Left,Proximal,Anterior Lower Leg . There was a Compression Therapy Procedure by [name], RN. Post procedure Diagnosis Wound #68: Same as Pre-Procedure Wound #69 Pre-procedure diagnosis of Wound #69 is a Venous Leg Ulcer located on the Right,Lateral Lower Leg . There was a Compression Therapy Procedure by [name], RN. Post procedure Diagnosis Wound #69: Same as Pre-Procedure Plan Follow-up Appointments: Return appointment in 1 month. Dressing Change Frequency: Other: - twice a week Skin Barriers/Peri-Wound Care: Moisturizing lotion Wound Cleansing: May shower with protection. Primary Wound Dressing: Wound #65 Left,Circumferential Lower Leg: Calcium Alginate with Silver Wound #68 Left,Proximal,Anterior Lower Leg: Calcium Alginate with Silver Wound #69 Right,Lateral Lower Leg: Calcium Alginate with Silver Secondary  Dressing: Wound #65 Left,Circumferential Lower Leg: ABD pad Wound #68 Left,Proximal,Anterior Lower Leg: ABD pad Wound #69 Right,Lateral Lower Leg: ABD pad Edema Control: 4 layer compression - Bilateral Elevate legs to the level of the heart or above for 30 minutes daily and/or when sitting, a frequency of: Segmental Compressive Device. - lymphedema pumps twice a day for 1 hour each time Home Health: Wound #65 Left,Circumferential Lower Leg: Continue Home Health skilled nursing for wound care. - amedysis 1. We continue silver alginate under 4-layer compression 2. There is no evidence of systemic fluid volume overload 3. In spite of not using his compression pumps his edema control was quite good. 4. We have not been able to transition him into stockings however. Electronic Signature(s) Signed: 04/06/2020 6:00:01 PM By: Linton Ham MD Entered By: Linton [name] on 04/06/2020 14:01:09 -------------------------------------------------------------------------------- SuperBill Details Patient Name: Date of Service: [name] [name], [name] RDIN W. 04/06/2020 Medical Record Number: [id number] Patient Account Number: [id number] Date of Birth/Sex: Treating RN: [birth year] (63 y.o. [name]) [name] Primary Care Provider: [name] Other Clinician: Referring Provider: Treating Provider/Extender: [name] in Treatment: 19 Diagnosis Coding ICD-10 Codes Code Description [phone] Chronic venous hypertension (idiopathic) with ulcer and inflammation of bilateral lower extremity I89.0 Lymphedema, not elsewhere classified L97.821 Non-pressure chronic ulcer of other part of left lower leg limited to breakdown of skin L97.811 Non-pressure chronic ulcer of other part of right lower leg limited to breakdown of skin L03.115 Cellulitis of right lower limb Physician Procedures : CPT4 Code Description Modifier [id number] - WC PHYS LEVEL 3 - EST PT ICD-10 Diagnosis Description L97.821  Non-pressure chronic ulcer of other part of left lower leg limited to breakdown of skin L97.811 Non-pressure chronic ulcer of other part of  right lower leg limited to breakdown of skin I87.333 Chronic venous hypertension (idiopathic) with ulcer and inflammation of bilateral lower extremity Quantity: 1 Electronic Signature(s) Signed: 04/06/2020 6:00:01 PM By: Linton Ham MD Entered By: Linton [name] on 04/06/2020 14:01:30

## 2020-04-13 DIAGNOSIS — I110 Hypertensive heart disease with heart failure: Secondary | ICD-10-CM | POA: Diagnosis not present

## 2020-04-13 DIAGNOSIS — L97821 Non-pressure chronic ulcer of other part of left lower leg limited to breakdown of skin: Secondary | ICD-10-CM | POA: Diagnosis not present

## 2020-04-13 DIAGNOSIS — E669 Obesity, unspecified: Secondary | ICD-10-CM | POA: Diagnosis not present

## 2020-04-13 DIAGNOSIS — G4733 Obstructive sleep apnea (adult) (pediatric): Secondary | ICD-10-CM | POA: Diagnosis not present

## 2020-04-13 DIAGNOSIS — I5032 Chronic diastolic (congestive) heart failure: Secondary | ICD-10-CM | POA: Diagnosis not present

## 2020-04-13 DIAGNOSIS — I872 Venous insufficiency (chronic) (peripheral): Secondary | ICD-10-CM | POA: Diagnosis not present

## 2020-04-16 DIAGNOSIS — L97821 Non-pressure chronic ulcer of other part of left lower leg limited to breakdown of skin: Secondary | ICD-10-CM | POA: Diagnosis not present

## 2020-04-16 DIAGNOSIS — G4733 Obstructive sleep apnea (adult) (pediatric): Secondary | ICD-10-CM | POA: Diagnosis not present

## 2020-04-16 DIAGNOSIS — I110 Hypertensive heart disease with heart failure: Secondary | ICD-10-CM | POA: Diagnosis not present

## 2020-04-16 DIAGNOSIS — I872 Venous insufficiency (chronic) (peripheral): Secondary | ICD-10-CM | POA: Diagnosis not present

## 2020-04-16 DIAGNOSIS — E669 Obesity, unspecified: Secondary | ICD-10-CM | POA: Diagnosis not present

## 2020-04-16 DIAGNOSIS — E1121 Type 2 diabetes mellitus with diabetic nephropathy: Secondary | ICD-10-CM | POA: Diagnosis not present

## 2020-04-16 DIAGNOSIS — N200 Calculus of kidney: Secondary | ICD-10-CM | POA: Diagnosis not present

## 2020-04-16 DIAGNOSIS — I5032 Chronic diastolic (congestive) heart failure: Secondary | ICD-10-CM | POA: Diagnosis not present

## 2020-04-16 DIAGNOSIS — I5033 Acute on chronic diastolic (congestive) heart failure: Secondary | ICD-10-CM | POA: Diagnosis not present

## 2020-04-17 DIAGNOSIS — G4733 Obstructive sleep apnea (adult) (pediatric): Secondary | ICD-10-CM | POA: Diagnosis not present

## 2020-04-17 DIAGNOSIS — E669 Obesity, unspecified: Secondary | ICD-10-CM | POA: Diagnosis not present

## 2020-04-17 DIAGNOSIS — L97821 Non-pressure chronic ulcer of other part of left lower leg limited to breakdown of skin: Secondary | ICD-10-CM | POA: Diagnosis not present

## 2020-04-17 DIAGNOSIS — I872 Venous insufficiency (chronic) (peripheral): Secondary | ICD-10-CM | POA: Diagnosis not present

## 2020-04-17 DIAGNOSIS — Z8673 Personal history of transient ischemic attack (TIA), and cerebral infarction without residual deficits: Secondary | ICD-10-CM | POA: Diagnosis not present

## 2020-04-17 DIAGNOSIS — Z9981 Dependence on supplemental oxygen: Secondary | ICD-10-CM | POA: Diagnosis not present

## 2020-04-17 DIAGNOSIS — I5032 Chronic diastolic (congestive) heart failure: Secondary | ICD-10-CM | POA: Diagnosis not present

## 2020-04-17 DIAGNOSIS — I110 Hypertensive heart disease with heart failure: Secondary | ICD-10-CM | POA: Diagnosis not present

## 2020-04-20 DIAGNOSIS — L97821 Non-pressure chronic ulcer of other part of left lower leg limited to breakdown of skin: Secondary | ICD-10-CM | POA: Diagnosis not present

## 2020-04-20 DIAGNOSIS — I110 Hypertensive heart disease with heart failure: Secondary | ICD-10-CM | POA: Diagnosis not present

## 2020-04-20 DIAGNOSIS — I872 Venous insufficiency (chronic) (peripheral): Secondary | ICD-10-CM | POA: Diagnosis not present

## 2020-04-20 DIAGNOSIS — E669 Obesity, unspecified: Secondary | ICD-10-CM | POA: Diagnosis not present

## 2020-04-20 DIAGNOSIS — G4733 Obstructive sleep apnea (adult) (pediatric): Secondary | ICD-10-CM | POA: Diagnosis not present

## 2020-04-20 DIAGNOSIS — I5032 Chronic diastolic (congestive) heart failure: Secondary | ICD-10-CM | POA: Diagnosis not present

## 2020-04-23 DIAGNOSIS — L97821 Non-pressure chronic ulcer of other part of left lower leg limited to breakdown of skin: Secondary | ICD-10-CM | POA: Diagnosis not present

## 2020-04-23 DIAGNOSIS — E669 Obesity, unspecified: Secondary | ICD-10-CM | POA: Diagnosis not present

## 2020-04-23 DIAGNOSIS — G4733 Obstructive sleep apnea (adult) (pediatric): Secondary | ICD-10-CM | POA: Diagnosis not present

## 2020-04-23 DIAGNOSIS — I872 Venous insufficiency (chronic) (peripheral): Secondary | ICD-10-CM | POA: Diagnosis not present

## 2020-04-23 DIAGNOSIS — I5032 Chronic diastolic (congestive) heart failure: Secondary | ICD-10-CM | POA: Diagnosis not present

## 2020-04-23 DIAGNOSIS — I110 Hypertensive heart disease with heart failure: Secondary | ICD-10-CM | POA: Diagnosis not present

## 2020-04-27 DIAGNOSIS — E669 Obesity, unspecified: Secondary | ICD-10-CM | POA: Diagnosis not present

## 2020-04-27 DIAGNOSIS — I872 Venous insufficiency (chronic) (peripheral): Secondary | ICD-10-CM | POA: Diagnosis not present

## 2020-04-27 DIAGNOSIS — I110 Hypertensive heart disease with heart failure: Secondary | ICD-10-CM | POA: Diagnosis not present

## 2020-04-27 DIAGNOSIS — I5032 Chronic diastolic (congestive) heart failure: Secondary | ICD-10-CM | POA: Diagnosis not present

## 2020-04-27 DIAGNOSIS — G4733 Obstructive sleep apnea (adult) (pediatric): Secondary | ICD-10-CM | POA: Diagnosis not present

## 2020-04-27 DIAGNOSIS — L97821 Non-pressure chronic ulcer of other part of left lower leg limited to breakdown of skin: Secondary | ICD-10-CM | POA: Diagnosis not present

## 2020-04-30 DIAGNOSIS — I110 Hypertensive heart disease with heart failure: Secondary | ICD-10-CM | POA: Diagnosis not present

## 2020-04-30 DIAGNOSIS — L97821 Non-pressure chronic ulcer of other part of left lower leg limited to breakdown of skin: Secondary | ICD-10-CM | POA: Diagnosis not present

## 2020-04-30 DIAGNOSIS — I5032 Chronic diastolic (congestive) heart failure: Secondary | ICD-10-CM | POA: Diagnosis not present

## 2020-04-30 DIAGNOSIS — I872 Venous insufficiency (chronic) (peripheral): Secondary | ICD-10-CM | POA: Diagnosis not present

## 2020-04-30 DIAGNOSIS — G4733 Obstructive sleep apnea (adult) (pediatric): Secondary | ICD-10-CM | POA: Diagnosis not present

## 2020-04-30 DIAGNOSIS — E669 Obesity, unspecified: Secondary | ICD-10-CM | POA: Diagnosis not present

## 2020-05-04 ENCOUNTER — Encounter (HOSPITAL_BASED_OUTPATIENT_CLINIC_OR_DEPARTMENT_OTHER): Payer: Medicare Other | Attending: Internal Medicine | Admitting: Internal Medicine

## 2020-05-04 ENCOUNTER — Other Ambulatory Visit: Payer: Self-pay

## 2020-05-04 DIAGNOSIS — L97811 Non-pressure chronic ulcer of other part of right lower leg limited to breakdown of skin: Secondary | ICD-10-CM | POA: Insufficient documentation

## 2020-05-04 DIAGNOSIS — I87333 Chronic venous hypertension (idiopathic) with ulcer and inflammation of bilateral lower extremity: Secondary | ICD-10-CM | POA: Insufficient documentation

## 2020-05-04 DIAGNOSIS — I890 Lymphedema, not elsewhere classified: Secondary | ICD-10-CM | POA: Diagnosis not present

## 2020-05-04 DIAGNOSIS — Z6841 Body Mass Index (BMI) 40.0 and over, adult: Secondary | ICD-10-CM | POA: Insufficient documentation

## 2020-05-04 DIAGNOSIS — L03115 Cellulitis of right lower limb: Secondary | ICD-10-CM | POA: Insufficient documentation

## 2020-05-04 DIAGNOSIS — G4733 Obstructive sleep apnea (adult) (pediatric): Secondary | ICD-10-CM | POA: Insufficient documentation

## 2020-05-04 DIAGNOSIS — I509 Heart failure, unspecified: Secondary | ICD-10-CM | POA: Diagnosis not present

## 2020-05-04 DIAGNOSIS — I872 Venous insufficiency (chronic) (peripheral): Secondary | ICD-10-CM | POA: Diagnosis not present

## 2020-05-04 DIAGNOSIS — L97822 Non-pressure chronic ulcer of other part of left lower leg with fat layer exposed: Secondary | ICD-10-CM | POA: Diagnosis not present

## 2020-05-04 DIAGNOSIS — L97821 Non-pressure chronic ulcer of other part of left lower leg limited to breakdown of skin: Secondary | ICD-10-CM | POA: Diagnosis not present

## 2020-05-06 DIAGNOSIS — Z23 Encounter for immunization: Secondary | ICD-10-CM | POA: Diagnosis not present

## 2020-05-07 DIAGNOSIS — E669 Obesity, unspecified: Secondary | ICD-10-CM | POA: Diagnosis not present

## 2020-05-07 DIAGNOSIS — L97821 Non-pressure chronic ulcer of other part of left lower leg limited to breakdown of skin: Secondary | ICD-10-CM | POA: Diagnosis not present

## 2020-05-07 DIAGNOSIS — I872 Venous insufficiency (chronic) (peripheral): Secondary | ICD-10-CM | POA: Diagnosis not present

## 2020-05-07 DIAGNOSIS — I110 Hypertensive heart disease with heart failure: Secondary | ICD-10-CM | POA: Diagnosis not present

## 2020-05-07 DIAGNOSIS — I5032 Chronic diastolic (congestive) heart failure: Secondary | ICD-10-CM | POA: Diagnosis not present

## 2020-05-07 DIAGNOSIS — G4733 Obstructive sleep apnea (adult) (pediatric): Secondary | ICD-10-CM | POA: Diagnosis not present

## 2020-05-11 DIAGNOSIS — E669 Obesity, unspecified: Secondary | ICD-10-CM | POA: Diagnosis not present

## 2020-05-11 DIAGNOSIS — G4733 Obstructive sleep apnea (adult) (pediatric): Secondary | ICD-10-CM | POA: Diagnosis not present

## 2020-05-11 DIAGNOSIS — I5032 Chronic diastolic (congestive) heart failure: Secondary | ICD-10-CM | POA: Diagnosis not present

## 2020-05-11 DIAGNOSIS — I872 Venous insufficiency (chronic) (peripheral): Secondary | ICD-10-CM | POA: Diagnosis not present

## 2020-05-11 DIAGNOSIS — I110 Hypertensive heart disease with heart failure: Secondary | ICD-10-CM | POA: Diagnosis not present

## 2020-05-11 DIAGNOSIS — L97821 Non-pressure chronic ulcer of other part of left lower leg limited to breakdown of skin: Secondary | ICD-10-CM | POA: Diagnosis not present

## 2020-05-14 DIAGNOSIS — I5032 Chronic diastolic (congestive) heart failure: Secondary | ICD-10-CM | POA: Diagnosis not present

## 2020-05-14 DIAGNOSIS — I110 Hypertensive heart disease with heart failure: Secondary | ICD-10-CM | POA: Diagnosis not present

## 2020-05-14 DIAGNOSIS — I872 Venous insufficiency (chronic) (peripheral): Secondary | ICD-10-CM | POA: Diagnosis not present

## 2020-05-14 DIAGNOSIS — G4733 Obstructive sleep apnea (adult) (pediatric): Secondary | ICD-10-CM | POA: Diagnosis not present

## 2020-05-14 DIAGNOSIS — E669 Obesity, unspecified: Secondary | ICD-10-CM | POA: Diagnosis not present

## 2020-05-14 DIAGNOSIS — L97821 Non-pressure chronic ulcer of other part of left lower leg limited to breakdown of skin: Secondary | ICD-10-CM | POA: Diagnosis not present

## 2020-05-15 NOTE — Progress Notes (Signed)
[name] [name] ([id number]) , Visit Report for 05/04/2020 Arrival Information Details Patient Name: Date of Service: [name] Ro RDIN W. 05/04/2020 12:30 PM Medical Record Number: [id number] Patient Account Number: [id number] Date of Birth/Sex: Treating RN: 11-Sep-1956 (64 y.o. [name]) [name] Primary Care Cloyd Ragas: [name] Other Clinician: Referring Tresten Pantoja: Treating Briaunna Grindstaff/Extender: [name] in Treatment: 23 Visit Information History Since Last Visit Added or deleted any medications: No Patient Arrived: Crutches Any [name] allergies or adverse reactions: No Arrival Time: 12:59 Had a fall or experienced change in No Accompanied By: self activities of daily living that may affect Transfer Assistance: None risk of falls: Patient Identification Verified: Yes Signs or symptoms of abuse/neglect since last visito No Secondary Verification Process Completed: Yes Hospitalized since last visit: No Patient Requires Transmission-Based Precautions: No Implantable device outside of the clinic excluding No Patient Has Alerts: Yes cellular tissue based products placed in the center Patient Alerts: R ABI = 1.25 since last visit: L ABI = 1.27 Has Dressing in Place as Prescribed: Yes Pain Present Now: No Electronic Signature(s) Signed: 05/06/2020 1:30:39 PM By: Sandre Kitty Entered By: Sandre Kitty on 05/04/2020 13:01:15 -------------------------------------------------------------------------------- Compression Therapy Details Patient Name: Date of Service: [name] Ro RDIN W. 05/04/2020 12:30 PM Medical Record Number: [id number] Patient Account Number: [id number] Date of Birth/Sex: Treating RN: May 04, 1956 (64 y.o. [name] Primary Care Zina Pitzer: [name] Other Clinician: Referring Laurelin Elson: Treating Johnathan Tortorelli/Extender: [name] in Treatment: 23 Compression Therapy Performed for Wound Assessment: Wound #65  Left,Circumferential Lower Leg Performed By: Clinician [name], RN Compression Type: Four Layer Post Procedure Diagnosis Same as Pre-procedure Electronic Signature(s) Signed: 05/14/2020 5:50:16 PM By: [name] RN Entered By: [name] on 05/04/2020 13:48:14 -------------------------------------------------------------------------------- Compression Therapy Details Patient Name: Date of Service: [name] Ro RDIN W. 05/04/2020 12:30 PM Medical Record Number: [id number] Patient Account Number: [id number] Date of Birth/Sex: Treating RN: 05/08/1956 (64 y.o. [name] Primary Care Donzella Carrol: [name] Other Clinician: Referring Hilary Milks: Treating Erhard Senske/Extender: [name] in Treatment: 23 Compression Therapy Performed for Wound Assessment: Wound #68 Left,Proximal,Anterior Lower Leg Performed By: Clinician [name], RN Compression Type: Four Layer Post Procedure Diagnosis Same as Pre-procedure Electronic Signature(s) Signed: 05/14/2020 5:50:16 PM By: [name] RN Entered By: [name] on 05/04/2020 13:48:14 -------------------------------------------------------------------------------- Encounter Discharge Information Details Patient Name: Date of Service: [name] [name], [name] [place] W. 05/04/2020 12:30 PM Medical Record Number: [id number] Patient Account Number: [id number] Date of Birth/Sex: Treating RN: 07-01-1956 (64 y.o. [name] Primary Care Tonique Mendonca: [name] Other Clinician: Referring Lusia Greis: Treating Peyson Delao/Extender: [name] in Treatment: 23 Encounter Discharge Information Items Discharge Condition: Stable Ambulatory Status: Ambulatory Discharge Destination: Home Transportation: Private Auto Accompanied By: self Schedule Follow-up Appointment: Yes Clinical Summary of Care: Patient Declined Electronic Signature(s) Signed: 05/04/2020 5:06:17 PM By: Kela Millin Entered By:  Kela Millin on 05/04/2020 14:17:55 -------------------------------------------------------------------------------- Lower Extremity Assessment Details Patient Name: Date of Service: [name] Ro RDIN W. 05/04/2020 12:30 PM Medical Record Number: [id number] Patient Account Number: [id number] Date of Birth/Sex: Treating RN: 13-May-1956 (64 y.o. [name] Primary Care Viren Lebeau: [name] Other Clinician: Referring Crystin Lechtenberg: Treating Dorotha Hirschi/Extender: [name] in Treatment: 23 Edema Assessment Assessed: [name]: No] [name]: No] Edema: [Left: Yes] [Right: Yes] Calf Left: Right: Point of Measurement: 30 cm From Medial Instep 39.5 cm 42 cm Ankle Left: Right: Point of Measurement: 13 cm From Medial Instep 26.5 cm 27 cm Vascular  Assessment Pulses: Dorsalis Pedis Palpable: [Left:Yes] [Right:Yes] Electronic Signature(s) Signed: 05/06/2020 5:36:57 PM By: Levan Hurst RN, BSN Entered By: Levan Hurst on 05/04/2020 13:25:01 -------------------------------------------------------------------------------- Multi Wound Chart Details Patient Name: Date of Service: [name] [name], [place] W. 05/04/2020 12:30 PM Medical Record Number: [id number] Patient Account Number: [id number] Date of Birth/Sex: Treating RN: Jun 04, 1956 (64 y.o. [name]) [name] Primary Care Epimenio Schetter: [name] Other Clinician: Referring Nihal Marzella: Treating Eloise Mula/Extender: [name] in Treatment: 23 Vital Signs Height(in): 70 Pulse(bpm): 65 Weight(lbs): 360 Blood Pressure(mmHg): 165/90 Body Mass Index(BMI): 52 Temperature(F): 97.6 Respiratory Rate(breaths/min): 20 Photos: [65:No Photos Left, Circumferential Lower Leg] [68:No Photos Left, Proximal, Anterior Lower Leg] [69:No Photos Right, Lateral Lower Leg] Wound Location: [65:Gradually Appeared] [68:Gradually Appeared] [69:Gradually Appeared] Wounding Event: [65:Venous Leg Ulcer] [68:Lymphedema]  [69:Venous Leg Ulcer] Primary Etiology: [65:Lymphedema] [68:N/A] [69:N/A] Secondary Etiology: [65:Lymphedema, Sleep Apnea,] [68:Lymphedema, Sleep Apnea,] [69:Lymphedema, Sleep Apnea,] Comorbid History: [65:Congestive Heart Failure, Hypertension, Peripheral Venous Disease, Osteoarthritis 09/08/2019] [68:Congestive Heart Failure, Hypertension, Peripheral Venous Disease, Osteoarthritis 03/23/2020] [69:Congestive Heart Failure,  Hypertension, Peripheral Venous Disease, Osteoarthritis 04/06/2020] Date Acquired: [65:23] [68:6] [69:4] Weeks of Treatment: [65:Open] [68:Open] [69:Healed - Epithelialized] Wound Status: [65:Yes] [68:No] [69:No] Clustered Wound: [65:5] [68:N/A] [69:N/A] Clustered Quantity: [65:4.1x4.5x0.1] [68:0.1x0.1x0.1] [69:0x0x0] Measurements L x W x D (cm) [65:46.503] [68:0.008] [69:0] A (cm) : rea [65:1.449] [68:0.001] [69:0] Volume (cm) : [65:97.60%] [68:99.60%] [69:100.00%] % Reduction in Area: [65:97.60%] [68:99.40%] [69:100.00%] % Reduction in Volume: [65:Full Thickness Without Exposed] [68:Full Thickness Without Exposed] [69:Full Thickness Without Exposed] Classification: [65:Support Structures Medium] [68:Support Structures Small] [69:Support Structures None Present] Exudate Amount: [65:Serosanguineous] [68:Serosanguineous] [69:N/A] Exudate Type: [65:red, brown] [68:red, brown] [69:N/A] Exudate Color: [65:Flat and Intact] [68:Flat and Intact] [69:Flat and Intact] Wound Margin: [65:Large (67-100%)] [68:Large (67-100%)] [69:None Present (0%)] Granulation Amount: [65:Red] [68:Red] [69:N/A] Granulation Quality: [65:None Present (0%)] [68:None Present (0%)] [69:None Present (0%)] Necrotic Amount: [65:Fat Layer (Subcutaneous Tissue): Yes Fat Layer (Subcutaneous Tissue): Yes Fascia: No] Exposed Structures: [65:Fascia: No Tendon: No Muscle: No Joint: No Bone: No Large (67-100%)] [68:Fascia: No Tendon: No Muscle: No Joint: No Bone: No Large (67-100%)] [69:Fat Layer (Subcutaneous  Tissue): No Tendon: No Muscle: No Joint: No Bone: No Large (67-100%)] Epithelialization: [65:Compression Therapy] [68:Compression Therapy] [69:N/A] Treatment Notes Electronic Signature(s) Signed: 05/04/2020 6:57:29 PM By: Linton Ham MD Signed: 05/14/2020 5:50:16 PM By: [name] RN Entered By: Linton Ham on 05/04/2020 13:54:50 -------------------------------------------------------------------------------- Multi-Disciplinary Care [name] Details Patient Name: Date of Service: [name] [name], [place] W. 05/04/2020 12:30 PM Medical Record Number: [id number] Patient Account Number: [id number] Date of Birth/Sex: Treating RN: [birth year] (64 y.o. [name] Primary Care Denasia Venn: [name] Other Clinician: Referring Lashara Urey: Treating Marteze Vecchio/Extender: [name] in Treatment: 23 Active Inactive Wound/Skin Impairment Nursing Diagnoses: Knowledge deficit related to ulceration/compromised skin integrity Goals: Patient/caregiver will verbalize understanding of skin care regimen Date Initiated: 11/25/2019 Target Resolution Date: 05/24/2020 Goal Status: Active Ulcer/skin breakdown will have a volume reduction of 30% by week 4 Date Initiated: 11/25/2019 Date Inactivated: 01/06/2020 Target Resolution Date: 12/26/2019 Goal Status: Met Ulcer/skin breakdown will have a volume reduction of 50% by week 8 Date Initiated: 01/06/2020 Date Inactivated: 02/17/2020 Target Resolution Date: 02/05/2020 Goal Status: Met Interventions: Assess patient/caregiver ability to obtain necessary supplies Assess patient/caregiver ability to perform ulcer/skin care regimen upon admission and as needed Assess ulceration(s) every visit Notes: Electronic Signature(s) Signed: 05/14/2020 5:50:16 PM By: [name] RN Entered By: [name] on 05/04/2020 12:59:43 -------------------------------------------------------------------------------- Pain Assessment Details Patient Name: Date of  Service: [name] [name],  [name] RDIN W. 05/04/2020 12:30 PM Medical Record Number: [id number] Patient Account Number: [id number] Date of Birth/Sex: Treating RN: [birth year] (64 y.o. [name]) [name] Primary Care Onisha Cedeno: [name] Other Clinician: Referring Deeanna Beightol: Treating Brigg Cape/Extender: [name] in Treatment: 23 Active Problems Location of Pain Severity and Description of Pain Patient Has Paino No Site Locations Pain Management and Medication Current Pain Management: Electronic Signature(s) Signed: 05/06/2020 1:30:39 PM By: Sandre Kitty Signed: 05/14/2020 5:50:16 PM By: [name] RN Entered By: Sandre Kitty on 05/04/2020 13:03:55 -------------------------------------------------------------------------------- Patient/Caregiver Education Details Patient Name: Date of Service: [name] [name] 8/17/2021andnbsp12:30 PM Medical Record Number: [id number] Patient Account Number: [id number] Date of Birth/Gender: Treating RN: 01-31-1956 (64 y.o. [name]) [name] Primary Care Physician: [name] Other Clinician: Referring Physician: Treating Physician/Extender: [name] in Treatment: 23 Education Assessment Education Provided To: Patient Education Topics Provided Wound/Skin Impairment: Methods: Explain/Verbal Responses: State content correctly Electronic Signature(s) Signed: 05/14/2020 5:50:16 PM By: [name] RN Entered By: [name] on 05/04/2020 13:00:04 -------------------------------------------------------------------------------- Wound Assessment Details Patient Name: Date of Service: [name] Ro RDIN W. 05/04/2020 12:30 PM Medical Record Number: [id number] Patient Account Number: [id number] Date of Birth/Sex: Treating RN: 04-12-1956 (64 y.o. [name]) [name] Primary Care Jyl Chico: [name] Other Clinician: Referring Dalana Pfahler: Treating Alleyne Lac/Extender: [name] in  Treatment: 23 Wound Status Wound Number: 65 Primary Venous Leg Ulcer Etiology: Wound Location: Left, Circumferential Lower Leg Secondary Lymphedema Wounding Event: Gradually Appeared Etiology: Date Acquired: 09/08/2019 Wound Open Weeks Of Treatment: 23 Status: Clustered Wound: Yes Comorbid Lymphedema, Sleep Apnea, Congestive Heart Failure, History: Hypertension, Peripheral Venous Disease, Osteoarthritis Photos Photo Uploaded By: Mikeal Hawthorne on 05/06/2020 16:03:10 Wound Measurements Length: (cm) 4.1 Width: (cm) 4.5 Depth: (cm) 0.1 Clustered Quantity: 5 Area: (cm) 14 Volume: (cm) 1. % Reduction in Area: 97.6% % Reduction in Volume: 97.6% Epithelialization: Large (67-100%) Tunneling: No .491 Undermining: No 449 Wound Description Classification: Full Thickness Without Exposed Support Structures Wound Margin: Flat and Intact Exudate Amount: Medium Exudate Type: Serosanguineous Exudate Color: red, brown Foul Odor After Cleansing: No Slough/Fibrino No Wound Bed Granulation Amount: Large (67-100%) Exposed Structure Granulation Quality: Red Fascia Exposed: No Necrotic Amount: None Present (0%) Fat Layer (Subcutaneous Tissue) Exposed: Yes Tendon Exposed: No Muscle Exposed: No Joint Exposed: No Bone Exposed: No Treatment Notes Wound #65 (Left, Circumferential Lower Leg) 1. Cleanse With Wound Cleanser Soap and water 2. Periwound Care TCA Cream 3. Primary Dressing Applied Calcium Alginate Ag 6. Support Layer Applied 4 layer compression wrap Electronic Signature(s) Signed: 05/06/2020 5:36:57 PM By: Levan Hurst RN, BSN Signed: 05/14/2020 5:50:16 PM By: [name] RN Entered By: Levan Hurst on 05/04/2020 13:25:32 -------------------------------------------------------------------------------- Wound Assessment Details Patient Name: Date of Service: [name] [name], [name] W. 05/04/2020 12:30 PM Medical Record Number: [id number] Patient Account Number:  [id number] Date of Birth/Sex: Treating RN: 1955/11/11 (64 y.o. [name]) [name] Primary Care Daekwon Beswick: [name] Other Clinician: Referring Bana Borgmeyer: Treating Rasheeda Mulvehill/Extender: [name] in Treatment: 23 Wound Status Wound Number: 68 Primary Lymphedema Etiology: Wound Location: Left, Proximal, Anterior Lower Leg Wound Open Wounding Event: Gradually Appeared Status: Date Acquired: 03/23/2020 Comorbid Lymphedema, Sleep Apnea, Congestive Heart Failure, Weeks Of Treatment: 6 History: Hypertension, Peripheral Venous Disease, Osteoarthritis Clustered Wound: No Photos Photo Uploaded By: Mikeal Hawthorne on 05/06/2020 16:03:24 Wound Measurements Length: (cm) 0.1 Width: (cm) 0.1 Depth: (cm) 0.1 Area: (cm) 0.008 Volume: (cm) 0.001 % Reduction in Area: 99.6% % Reduction in Volume: 99.4% Epithelialization:  Large (67-100%) Tunneling: No Undermining: No Wound Description Classification: Full Thickness Without Exposed Support Str Wound Margin: Flat and Intact Exudate Amount: Small Exudate Type: Serosanguineous Exudate Color: red, brown uctures Wound Bed Granulation Amount: Large (67-100%) Exposed Structure Granulation Quality: Red Fascia Exposed: No Necrotic Amount: None Present (0%) Fat Layer (Subcutaneous Tissue) Exposed: Yes Tendon Exposed: No Muscle Exposed: No Joint Exposed: No Bone Exposed: No Treatment Notes Wound #68 (Left, Proximal, Anterior Lower Leg) 1. Cleanse With Wound Cleanser Soap and water 2. Periwound Care TCA Cream 3. Primary Dressing Applied Calcium Alginate Ag 6. Support Layer Applied 4 layer compression Water quality scientist) Signed: 05/06/2020 5:36:57 PM By: Levan Hurst RN, BSN Signed: 05/14/2020 5:50:16 PM By: [name] RN Entered By: Levan Hurst on 05/04/2020 13:26:15 -------------------------------------------------------------------------------- Wound Assessment Details Patient Name: Date of  Service: [name] [name], [name] W. 05/04/2020 12:30 PM Medical Record Number: [id number] Patient Account Number: [id number] Date of Birth/Sex: Treating RN: March 23, 1956 (64 y.o. [name]) [name] Primary Care Nyle Limb: [name] Other Clinician: Referring Rosalita Carey: Treating Ayonna Speranza/Extender: [name] in Treatment: 23 Wound Status Wound Number: 69 Primary Venous Leg Ulcer Etiology: Wound Location: Right, Lateral Lower Leg Wound Healed - Epithelialized Wounding Event: Gradually Appeared Status: Date Acquired: 04/06/2020 Comorbid Lymphedema, Sleep Apnea, Congestive Heart Failure, Weeks Of Treatment: 4 History: Hypertension, Peripheral Venous Disease, Osteoarthritis Clustered Wound: No Photos Photo Uploaded By: Mikeal Hawthorne on 05/06/2020 16:02:56 Wound Measurements Length: (cm) Width: (cm) Depth: (cm) Area: (cm) Volume: (cm) 0 % Reduction in Area: 100% 0 % Reduction in Volume: 100% 0 Epithelialization: Large (67-100%) 0 Tunneling: No 0 Undermining: No Wound Description Classification: Full Thickness Without Exposed Support Structures Wound Margin: Flat and Intact Exudate Amount: None Present Foul Odor After Cleansing: No Slough/Fibrino No Wound Bed Granulation Amount: None Present (0%) Exposed Structure Necrotic Amount: None Present (0%) Fascia Exposed: No Fat Layer (Subcutaneous Tissue) Exposed: No Tendon Exposed: No Muscle Exposed: No Joint Exposed: No Bone Exposed: No Electronic Signature(s) Signed: 05/06/2020 5:36:57 PM By: Levan Hurst RN, BSN Signed: 05/14/2020 5:50:16 PM By: [name] RN Entered By: Levan Hurst on 05/04/2020 13:26:44 -------------------------------------------------------------------------------- Vitals Details Patient Name: Date of Service: [name] [name], [name] [place] W. 05/04/2020 12:30 PM Medical Record Number: [id number] Patient Account Number: [id number] Date of Birth/Sex: Treating RN: 1956-06-20 (64 y.o. [name] Primary Care Giankarlo Leamer: [name] Other Clinician: Referring Aryanah Enslow: Treating Aleks Nawrot/Extender: [name] in Treatment: 23 Vital Signs Time Taken: 13:03 Temperature (F): 97.6 Height (in): 70 Pulse (bpm): 65 Weight (lbs): 360 Respiratory Rate (breaths/min): 20 Body Mass Index (BMI): 51.6 Blood Pressure (mmHg): 165/90 Reference Range: 80 - 120 mg / dl Electronic Signature(s) Signed: 05/06/2020 1:30:39 PM By: Sandre Kitty Entered By: Sandre Kitty on 05/04/2020 13:03:47

## 2020-05-15 NOTE — Progress Notes (Signed)
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 superficial area on the right leg a large number of small wounds on the left leg. He is not using his compression pumps. He has home health changing his dressings READMISSION 06/13/2019 [name] [name] is now a 64 year old man. He has a long history of chronic venous insufficiency with chronic stasis dermatitis and lymphedema. He was last in this clinic in  June at that point he had a small superficial area on the right leg and the larger number of small wounds on the left. He has been using silver alginate and and 4-layer compression. He still has [facility] home health care coming out. He tells me he went to [facility] wound care twice. They healed him out after that he does not think he was actually healed. His wife at the time was in [place] after falling and fracturing her femur by the sound of it. She is currently in a nursing home in [place] He comes into clinic today with large areas of superficial denuded epithelium which is almost circumferential on the right and a large area on the left lateral. His edema control is marginal. He is not in any pain. Past medical history includes congestive heart failure, previous venous ablation, lymphedema and obstructive sleep apnea. He has compression pumps at home but he has been completely noncompliant with this by his own admission. He is not a diabetic. ABIs in our clinic were 1.27 on the right  and 1.25 on the left we do not have time for this this afternoon I am and I am not comfortable 10/9; the patient arrives with green drainage under the compression right greater than left. He is not complaining of any pain. He also almost circumferential epithelial loss on the right. He has home health changing the dressing. We are doing 2-week follow-ups. He lives in [place] 10/23; 2-week follow-up. The patient took his antibiotics he seems to have tolerated this well. He has still areas on the right and left calf left more substantially. I think he has some improvement in the epithelialization. He has new wounds on the left dorsal foot today. He says he has been using compression pumps 11/6; two-week follow-up. The patient arrived still with wounds mostly on his lateral lower legs. He has a new area on the right dorsal foot today just in close proximity to his toes. His edema control is marginal. He will not use his  compression pumps. He has home health changing the dressings. He tells [place] that his wife is in the hospital in [place] with heart failure. I suspect he is sitting at her bedside for most the day. His legs are probably dependent. 12/4; 1 month follow-up. He arrives with everything on his legs completely closed. He has some form of external compression garment at home as well as compression pumps. READMISSION 11/25/2019 We discharge this patient on 08/22/2019 with everything on his bilateral lower legs closed. He had an external compression stocking for both legs at home as well as compression pumps although admittedly he has never been compliant with the latter. He states his legs stay closed for about 2 or 3 weeks. He ended up in hospital at [place] from 12/22 through 12/29 with Covid infection. He came home and is gradually been regaining his strength. Currently has bilateral innumerable wounds almost circumferentially on both lower legs in the setting of severe venous inflammation but no current infection. The patient has diastolic heart failure hypertension history of TIA chronic venous ulcers had obstructive sleep apnea although he is not compliant with CPAP. He was never felt to have an arterial issue in this clinic his pedal pulses and ABIs have been normal most recently in September/20 at 1.25 on the right and 1.27 on the left 3/23; he arrives with much less edema in both legs. He has home health changing the dressings. Been using silver alginate. He only has an open area along the wrap line of both legs. The rest of this seems to be closed. 4/6; better edema control in our compression wraps. He has not been using his external compression pumps he tells me because his wife was hospitalized for about 10 days. We have been using silver alginate on the wounds. 4/20; he has good edema control and compression wraps. His [facility] is back [facility] he says he has not been using the external  compression pumps. Silver alginate to the wounds 5/4; he has good edema control bilaterally. He has not been using the compression pumps he tells [place] his wife is coming home from [place] today. He does not have any open wounds on the right leg continued large collection of small wounds on the left anterior leg extending medially into laterally nothing much posteriorly on the left. 6/1. He has a smattering of small wounds anteriorly on the left and a larger but superficial wound on the left posterior calf. There is nothing open on the right we have been using silver alginate  on the left I will change that the [facility] today 6/22; there is nothing open on his right leg. He has a smattering of small eschars on the left anterior lower leg but no open wounds per se. Somebody applied the compression wrap on the right leg in a very irregular fashion. He has a very tender area on the right medial ankle. 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 Not surprisingly comes back in with wounds on his bilateral legs at the level of where his external compression garment was tight superiorly. He tells me he is using his pumps once every second day. 7/20; 2-week follow-up the patient has not been using his compression pumps his wife was transferred to a new nursing home. He has 1 wound on the medial left lower leg and a small area on the right lateral 8/17; patient arrives today with only a smattering of small wounds on the left anterior lower leg most of these are eschared and look benign. There is nothing on his right leg. We have been using silver alginate under 4-layer compression Objective Constitutional Patient is hypertensive.. Pulse regular and within target range for patient.Marland Kitchen Respirations regular, non-labored and within target range.. Temperature is normal and within the target range for the patient.Marland Kitchen Appears in no  distress. Vitals Time Taken: 1:03 PM, Height: 70 in, Weight: 360 lbs, BMI: 51.6, Temperature: 97.6 F, Pulse: 65 bpm, Respiratory Rate: 20 breaths/min, Blood Pressure: 165/90 mmHg. Respiratory work of breathing is normal. Cardiovascular No evidence of CHF. Peripheral pulses are palpable. General Notes: Wound exam; large area on the left medial has closed he has small areas on the left anterior nothing open on the right leg. His edema control looks quite good peripheral pulses are palpable Integumentary (Hair, Skin) Wound #65 status is Open. Original cause of wound was Gradually Appeared. The wound is located on the Left,Circumferential Lower Leg. The wound measures 4.1cm length x 4.5cm width x 0.1cm depth; 14.491cm^2 area and 1.449cm^3 volume. There is Fat Layer (Subcutaneous Tissue) Exposed exposed. There is no tunneling or undermining noted. There is a medium amount of serosanguineous drainage noted. The wound margin is flat and intact. There is large (67-100%) red granulation within the wound bed. There is no necrotic tissue within the wound bed. Wound #68 status is Open. Original cause of wound was Gradually Appeared. The wound is located on the Left,Proximal,Anterior Lower Leg. The wound measures 0.1cm length x 0.1cm width x 0.1cm depth; 0.008cm^2 area and 0.001cm^3 volume. There is Fat Layer (Subcutaneous Tissue) Exposed exposed. There is no tunneling or undermining noted. There is a small amount of serosanguineous drainage noted. The wound margin is flat and intact. There is large (67- 100%) red granulation within the wound bed. There is no necrotic tissue within the wound bed. Wound #69 status is Healed - Epithelialized. Original cause of wound was Gradually Appeared. The wound is located on the Right,Lateral Lower Leg. The wound measures 0cm length x 0cm width x 0cm depth; 0cm^2 area and 0cm^3 volume. There is no tunneling or undermining noted. There is a none present amount of drainage  noted. The wound margin is flat and intact. There is no granulation within the wound bed. There is no necrotic tissue within the wound bed. 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 There was a Four Layer Compression Therapy Procedure by [name], RN. Post procedure Diagnosis Wound #65: Same as Pre-Procedure Wound #68 Pre-procedure diagnosis of Wound #68 is a Lymphedema located on the Left,Proximal,Anterior Lower Leg . There was a Four Layer Compression Therapy Procedure by [name], RN. Post procedure Diagnosis Wound #68: Same as Pre-Procedure Plan Follow-up Appointments: Return Appointment in 2 weeks. Dressing Change Frequency: Other: - twice a week Skin Barriers/Peri-Wound Care: Moisturizing lotion Wound Cleansing: May shower with protection. Primary Wound Dressing: Wound #65 Left,Circumferential Lower Leg: Calcium Alginate with Silver Wound #68 Left,Proximal,Anterior Lower Leg: Calcium Alginate with Silver Secondary Dressing: Wound #65 Left,Circumferential Lower Leg: ABD pad Wound #68 Left,Proximal,Anterior Lower Leg: ABD pad Edema Control: 4 layer compression - Bilateral Elevate legs to the level of the heart or above for 30 minutes daily and/or when sitting, a frequency of: Segmental Compressive Device. - lymphedema pumps twice a day for 1 hour each time Home Health: Wound #65 Left,Circumferential Lower Leg: Continue Home Health skilled nursing for wound care. - amedysis 1. Used with silver alginate to  the wounds on the left leg 2. There is nothing open on the right that I can see 3. I am hopeful in 2 weeks he will be healed 4. I have spoken to him about his compliance with compression pumps. Without this I do not think he should be readmitted for care here. Electronic Signature(s) Signed: 05/04/2020 6:57:29 PM By: Linton Ham MD Entered By: Linton Ham on 05/04/2020 14:03:43 -------------------------------------------------------------------------------- SuperBill Details Patient Name: Date of Service: [name] [name], [name] RDIN [name]. 05/04/2020 Medical Record Number: [id number] Patient Account Number: [id number] Date of Birth/Sex: Treating RN: 1956-01-19 (64 y.o. 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 Modifier: Quantity: 1 Physician Procedures : CPT4 Code Description Modifier [id number] - WC PHYS LEVEL 3 - EST PT ICD-10 Diagnosis Description I87.333 Chronic venous hypertension (idiopathic) with ulcer and inflammation of bilateral lower extremity L97.821 Non-pressure chronic ulcer of other  part of left lower leg limited to breakdown of skin L97.811 Non-pressure chronic ulcer of other part of right lower leg limited to breakdown of skin Quantity: 1 Electronic  Signature(s) Signed: 05/04/2020 6:57:29 PM By: Linton Ham MD Entered By: Linton Ham on 05/04/2020 14:04:06

## 2020-05-17 DIAGNOSIS — Z9981 Dependence on supplemental oxygen: Secondary | ICD-10-CM | POA: Diagnosis not present

## 2020-05-17 DIAGNOSIS — G4733 Obstructive sleep apnea (adult) (pediatric): Secondary | ICD-10-CM | POA: Diagnosis not present

## 2020-05-17 DIAGNOSIS — I110 Hypertensive heart disease with heart failure: Secondary | ICD-10-CM | POA: Diagnosis not present

## 2020-05-17 DIAGNOSIS — L97821 Non-pressure chronic ulcer of other part of left lower leg limited to breakdown of skin: Secondary | ICD-10-CM | POA: Diagnosis not present

## 2020-05-17 DIAGNOSIS — Z8673 Personal history of transient ischemic attack (TIA), and cerebral infarction without residual deficits: Secondary | ICD-10-CM | POA: Diagnosis not present

## 2020-05-17 DIAGNOSIS — E669 Obesity, unspecified: Secondary | ICD-10-CM | POA: Diagnosis not present

## 2020-05-17 DIAGNOSIS — I872 Venous insufficiency (chronic) (peripheral): Secondary | ICD-10-CM | POA: Diagnosis not present

## 2020-05-17 DIAGNOSIS — I5032 Chronic diastolic (congestive) heart failure: Secondary | ICD-10-CM | POA: Diagnosis not present

## 2020-05-18 ENCOUNTER — Encounter (HOSPITAL_BASED_OUTPATIENT_CLINIC_OR_DEPARTMENT_OTHER): Payer: Medicare Other | Admitting: Internal Medicine

## 2020-05-18 ENCOUNTER — Other Ambulatory Visit: Payer: Self-pay

## 2020-05-18 DIAGNOSIS — L97811 Non-pressure chronic ulcer of other part of right lower leg limited to breakdown of skin: Secondary | ICD-10-CM | POA: Diagnosis not present

## 2020-05-18 DIAGNOSIS — L03115 Cellulitis of right lower limb: Secondary | ICD-10-CM | POA: Diagnosis not present

## 2020-05-18 DIAGNOSIS — I872 Venous insufficiency (chronic) (peripheral): Secondary | ICD-10-CM | POA: Diagnosis not present

## 2020-05-18 DIAGNOSIS — G4733 Obstructive sleep apnea (adult) (pediatric): Secondary | ICD-10-CM | POA: Diagnosis not present

## 2020-05-18 DIAGNOSIS — I509 Heart failure, unspecified: Secondary | ICD-10-CM | POA: Diagnosis not present

## 2020-05-18 DIAGNOSIS — I87333 Chronic venous hypertension (idiopathic) with ulcer and inflammation of bilateral lower extremity: Secondary | ICD-10-CM | POA: Diagnosis not present

## 2020-05-18 DIAGNOSIS — L97821 Non-pressure chronic ulcer of other part of left lower leg limited to breakdown of skin: Secondary | ICD-10-CM | POA: Diagnosis not present

## 2020-05-19 NOTE — Progress Notes (Signed)
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 dressings i.e. [facility] 5/12; monthly follow-up. Much better condition of the edema in his bilateral lower legs. He has home health using 4 layer compression and he states he uses his compression pumps every second day 6/12; monthly follow-up. He has decent edema control. He only has a small superficial  area on the right leg a large number of small wounds on the left leg. He is not using his compression pumps. He has home health changing his dressings READMISSION 06/13/2019 Mr. rogacki is now a 64 year old man. He has a long history of chronic venous insufficiency with chronic stasis dermatitis and lymphedema. He was last in this clinic in June at that point he had a small superficial area on the right leg and the larger number of small wounds on the left. He has been using silver alginate and and 4-layer compression. He still has [facility] home health care coming out. He tells me he went to [facility] wound care twice. They healed him out after that he does not think he was actually healed. His wife at the time was in [place] after falling and fracturing her femur by the sound of it. She is currently in a nursing home in [place] He comes into clinic today with large areas of superficial denuded epithelium which is almost circumferential on the right and a large area on the left lateral. His edema control is marginal.  He is not in any pain. Past medical history includes congestive heart failure, previous venous ablation, lymphedema and obstructive sleep apnea. He has compression pumps at home but he has been completely noncompliant with this by his own admission. He is not a diabetic. ABIs in our clinic were 1.27 on the right and 1.25 on the left we do not have time for this this afternoon I am and I am not comfortable 10/9; the patient arrives with green drainage under the compression right greater than left. He is not complaining of any pain. He also almost circumferential epithelial loss on the right. He has home health changing the dressing. We are doing 2-week follow-ups. He lives in [place] 10/23; 2-week follow-up. The patient took his antibiotics he seems to have tolerated this well. He has still areas on the right and left calf left more substantially. I think he has some improvement in the  epithelialization. He has new wounds on the left dorsal foot today. He says he has been using compression pumps 11/6; two-week follow-up. The patient arrived still with wounds mostly on his lateral lower legs. He has a new area on the right dorsal foot today just in close proximity to his toes. His edema control is marginal. He will not use his compression pumps. He has home health changing the dressings. He tells [place] that his wife is in the hospital in [place] with heart failure. I suspect he is sitting at her bedside for most the day. His legs are probably dependent. 12/4; 1 month follow-up. He arrives with everything on his legs completely closed. He has some form of external compression garment at home as well as compression pumps. READMISSION 11/25/2019 We discharge this patient on 08/22/2019 with everything on his bilateral lower legs closed. He had an external compression stocking for both legs at home as well as compression pumps although admittedly he has never been compliant with the latter. He states his legs stay closed for about 2 or 3 weeks. He ended up in hospital at [facility] from 12/22 through 12/29 with Covid infection. He came home and is gradually been regaining his strength. Currently has bilateral innumerable wounds almost circumferentially on both lower legs in the setting of severe venous inflammation but no current infection. The patient has diastolic heart failure hypertension history of TIA chronic venous ulcers had obstructive sleep apnea although he is not compliant with CPAP. He was never felt to have an arterial issue in this clinic his pedal pulses and ABIs have been normal most recently in September/20 at 1.25 on the right and 1.27 on the left 3/23; he arrives with much less edema in both legs. He has home health changing the dressings. Been using silver alginate. He only has an open area along the wrap line of both legs. The rest of this seems to be closed. 4/6;  better edema control in our compression wraps. He has not been using his external compression pumps he tells me because his wife was hospitalized for about 10 days. We have been using silver alginate on the wounds. 4/20; he has good edema control and compression wraps. His wife is back at [facility] he says he has not been using the external compression pumps. Silver alginate to the wounds 5/4; he has good edema control bilaterally. He has not been using the compression pumps he tells [place] his wife is coming home from [place] today. He does not have any open wounds on the right leg continued large collection of  small wounds on the left anterior leg extending medially into laterally nothing much posteriorly on the left. 6/1. He has a smattering of small wounds anteriorly on the left and a larger but superficial wound on the left posterior calf. There is nothing open on the right we have been using silver alginate on the left I will change that the [facility] today 6/22; there is nothing open on his right leg. He has a smattering of small eschars on the left anterior lower leg but no open wounds per se. Somebody applied the compression wrap on the right leg in a very irregular fashion. He has a very tender area on the right medial ankle. 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 Not surprisingly comes back in with wounds on his bilateral legs at the level of where his external compression garment was tight superiorly. He tells me he is using his pumps once every second day. 7/20; 2-week follow-up the patient has not been using his compression pumps his wife was transferred to a new nursing home. He has 1 wound on the medial left lower leg and a small area on the right lateral 8/17; patient arrives today with only a smattering of small wounds on the left anterior lower leg most of these are eschared and look benign. There is nothing  on his right leg. We have been using silver alginate under 4-layer compression 8/31; patient's wounds on the left anterior lower leg are larger. There is still nothing open on the right toe although we did put compression wraps on this last time. He has home health. Says he is used his compression pumps twice in the last 3 days. Obviously not sufficient Objective Constitutional Pulse regular and within target range for patient.. Temperature is normal and within the target range for the patient.Marland Kitchen Appears in no distress. Vitals Time Taken: 12:51 PM, Height: 70 in, Source: Stated, Weight: 360 lbs, Source: Stated, BMI: 51.6, Temperature: 97.7 F, Pulse: 66 bpm, Respiratory Rate: 20 breaths/min. Cardiovascular Pedal pulses are palpable.. Marginal edema control. General Notes: Wound exam; collection of small open areas on the left anterior pretibial area. He still has nothing open on the right leg Integumentary (Hair, Skin) Skin changes of severe chronic venous insufficiency with secondary lymphedema. Wound #65 status is Open. Original cause of wound was Gradually Appeared. The wound is located on the Left,Circumferential Lower Leg. The wound measures 6.5cm length x 5cm width x 0.1cm depth; 25.525cm^2 area and 2.553cm^3 volume. There is Fat Layer (Subcutaneous Tissue) exposed. There is no tunneling or undermining noted. There is a medium amount of serosanguineous drainage noted. The wound margin is flat and intact. There is large (67-100%) red granulation within the wound bed. There is a small (1-33%) amount of necrotic tissue within the wound bed including Adherent Slough. Wound #68 status is Open. Original cause of wound was Gradually Appeared. The wound is located on the Left,Proximal,Anterior Lower Leg. The wound measures 0.7cm length x 0.3cm width x 0.1cm depth; 0.165cm^2 area and 0.016cm^3 volume. There is Fat Layer (Subcutaneous Tissue) exposed. There is no tunneling or undermining noted. There  is a small amount of serous drainage noted. The wound margin is flat and intact. There is large (67-100%) red granulation within the wound bed. There is no necrotic tissue within the wound bed. 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 There was a Four Layer Compression Therapy Procedure by [name], RN. Post procedure Diagnosis Wound #65: Same as Pre-Procedure Wound #68 Pre-procedure diagnosis of Wound #68 is a Lymphedema located on the Left,Proximal,Anterior Lower Leg . There was a Four Layer Compression Therapy Procedure by [name], RN. Post procedure Diagnosis Wound #68: Same as Pre-Procedure Plan Follow-up Appointments: Return Appointment in 2 weeks. Dressing Change Frequency: Other: - twice a week Skin Barriers/Peri-Wound Care: Moisturizing lotion Wound Cleansing: May shower with protection. Primary Wound Dressing: Wound #65 Left,Circumferential Lower Leg: Calcium Alginate with Silver Wound #68 Left,Proximal,Anterior Lower Leg: Calcium Alginate with Silver Secondary Dressing: Wound #65 Left,Circumferential Lower Leg: ABD pad Wound #68 Left,Proximal,Anterior Lower Leg: ABD pad Edema Control: 4 layer compression: Left lower extremity Elevate legs to the level of the heart or above for 30 minutes daily and/or when sitting, a frequency of: Support Garment 20-30 mm/Hg pressure to: - right lower leg, on in the AM off in the PM Segmental Compressive Device. - lymphedema pumps twice a day for 1 hour each  time Home Health: Wound #65 Left,Circumferential Lower Leg: Continue Home Health skilled nursing for wound care. - [facility] 1. I put him back with silver alginate all the usual secondary dressings and 4-layer compression on the left 2. His own zippered stocking on the right leg although I did this with a certain degree of trepidation. This is not worked well in the past 3. Again I have asked him to consider using his compression pumps twice a day. Without this I do not think we are going to be able to maintain skin integrity Electronic Signature(s) Signed: 05/18/2020 5:08:08 PM By: Linton Ham MD Entered By: Linton Ham on 05/18/2020 14:08:54 -------------------------------------------------------------------------------- SuperBill Details Patient Name: Date of Service: [name], Amado W. 05/18/2020 Medical Record Number: [id number] Patient Account Number: [id number] Date of Birth/Sex: Treating RN: [birth year] (64 y.o. [name] [name]) [name] Primary Care Provider: [name] Other Clinician: Referring Provider: Treating Provider/Extender: [name] in Treatment: 25 Diagnosis Coding ICD-10 Codes Code Description [phone] Chronic venous hypertension (idiopathic) with ulcer and inflammation of bilateral lower extremity I89.0 Lymphedema, not elsewhere classified L97.821 Non-pressure chronic ulcer of other part of left lower leg limited to breakdown of skin L97.811 Non-pressure chronic ulcer of other part of right lower leg limited to breakdown of skin L03.115 Cellulitis of right lower limb Facility Procedures CPT4 Code: [id number] Description: (Facility Use Only) [phone] - APPLY [place] LWR LT LEG Modifier: Quantity: 1 Physician Procedures : CPT4 Code Description Modifier [id number] - WC PHYS LEVEL 3 - EST PT ICD-10 Diagnosis Description L97.821 Non-pressure chronic ulcer of other part of left lower leg limited to breakdown of skin I89.0 Lymphedema, not  elsewhere classified Quantity: 1 Electronic Signature(s) Signed: 05/18/2020 5:08:08 PM By: Linton Ham MD Entered By: Linton Ham on 05/18/2020 14:09:16

## 2020-05-19 NOTE — Progress Notes (Signed)
BREVIN MCFADDEN ([id number]) , Visit Report for 05/18/2020 Arrival Information Details Patient Name: Date of Service: [name] 05/18/2020 12:30 PM Medical Record Number: [id number] Patient Account Number: [id number] Date of Birth/Sex: Treating RN: 04-29-1956 (64 y.o. [name] Primary Care Genifer Lazenby: [name] Other Clinician: Referring Jeanmarc Viernes: Treating Virda Betters/Extender: [name] in Treatment: 25 Visit Information History Since Last Visit Added or deleted any medications: No Patient Arrived: [name]: No Arrival Time: 12:45 Had a fall or experienced change in No Accompanied By: self activities of daily living that may affect Transfer Assistance: None risk of falls: Patient Identification Verified: Yes Signs or symptoms of abuse/neglect since last visito No Secondary Verification Process Completed: Yes Hospitalized since last visit: No Patient Requires Transmission-Based Precautions: No Implantable device outside of the clinic excluding No Patient Has Alerts: Yes cellular tissue based products placed in the center Patient Alerts: R ABI = 1.25 since last visit: L ABI = 1.27 Has Dressing in Place as Prescribed: Yes Has Compression in Place as Prescribed: Yes Pain Present Now: No Electronic Signature(s) Signed: 05/18/2020 4:57:23 PM By: [name] RN, BSN Entered By: [name] on 05/18/2020 12:48:03 -------------------------------------------------------------------------------- Compression Therapy Details Patient Name: Date of Service: [name], [place]. 05/18/2020 12:30 PM Medical Record Number: [id number] Patient Account Number: [id number] Date of Birth/Sex: Treating RN: [birth year] (64 y.o. [name] Primary Care Masaye Gatchalian: [name] Other Clinician: Referring Kylinn Shropshire: Treating Pearle Wandler/Extender: [name] in Treatment: 25 Compression Therapy  Performed for Wound Assessment: Wound #65 Left,Circumferential Lower Leg Performed By: Clinician [name], RN Compression Type: Four Layer Post Procedure Diagnosis Same as Pre-procedure Electronic Signature(s) Signed: 05/19/2020 10:37:46 AM By: [name] RN Entered By: [name] on 05/18/2020 13:21:03 -------------------------------------------------------------------------------- Compression Therapy Details Patient Name: Date of Service: [name] 05/18/2020 12:30 PM Medical Record Number: [id number] Patient Account Number: [id number] Date of Birth/Sex: Treating RN: [birth year] (64 y.o. [name] Primary Care Williom Cedar: [name] Other Clinician: Referring Urho Rio: Treating Marilou Barnfield/Extender: [name] in Treatment: 25 Compression Therapy Performed for Wound Assessment: Wound #68 Left,Proximal,Anterior Lower Leg Performed By: Clinician [name], RN Compression Type: Four Layer Post Procedure Diagnosis Same as Pre-procedure Electronic Signature(s) Signed: 05/19/2020 10:37:46 AM By: [name] RN Entered By: [name] on 05/18/2020 13:21:04 -------------------------------------------------------------------------------- Encounter Discharge Information Details Patient Name: Date of Service: [name], [name] 05/18/2020 12:30 PM Medical Record Number: [id number] Patient Account Number: [id number] Date of Birth/Sex: Treating RN: 25-Jan-1956 (64 y.o. [name] Primary Care Caeleb Batalla: [name] Other Clinician: Referring Emberlee Sortino: Treating Peityn Payton/Extender: [name] in Treatment: 25 Encounter Discharge Information Items Discharge Condition: Stable Ambulatory Status: Cane Discharge Destination: Home Transportation: Private Auto Accompanied By: self Schedule Follow-up Appointment: Yes Clinical Summary of Care: Patient Declined Electronic Signature(s) Signed: 05/18/2020 5:00:49 PM  By: Kela Millin Entered By: Kela Millin on 05/18/2020 13:47:18 -------------------------------------------------------------------------------- Lower Extremity Assessment Details Patient Name: Date of Service: [name] 05/18/2020 12:30 PM Medical Record Number: [id number] Patient Account Number: [id number] Date of Birth/Sex: Treating RN: Jul 25, 1956 (64 y.o. [name] Primary Care Emnet Monk: [name] Other Clinician: Referring Fidencia Mccloud: Treating Yogesh Cominsky/Extender: [name] in Treatment: 25 Edema Assessment Assessed: [name]: No] [name]: No] Edema: [Left: Yes] [Right: Yes] Calf Left: Right: Point of Measurement: 30 cm From Medial Instep 41 cm 44 cm Ankle Left: Right: Point of Measurement: 13  cm From Medial Instep 25.5 cm 27.3 cm Vascular Assessment Pulses: Dorsalis Pedis Palpable: [Left:Yes] [Right:Yes] Electronic Signature(s) Signed: 05/18/2020 4:57:23 PM By: [name] RN, BSN Entered By: [name] on 05/18/2020 13:04:48 -------------------------------------------------------------------------------- Multi Wound Chart Details Patient Name: Date of Service: [name], [name] 05/18/2020 12:30 PM Medical Record Number: [id number] Patient Account Number: [id number] Date of Birth/Sex: Treating RN: 03-Feb-1956 (64 y.o. [name] Primary Care Adele Milson: [name] Other Clinician: Referring Nat Lowenthal: Treating Demarea Lorey/Extender: [name] in Treatment: 25 Vital Signs Height(in): 70 Pulse(bpm): 66 Weight(lbs): 360 Blood Pressure(mmHg): Body Mass Index(BMI): 52 Temperature(F): 97.7 Respiratory Rate(breaths/min): 20 Photos: [65:No Photos Left, Circumferential Lower Leg] [68:No Photos Left, Proximal, Anterior Lower Leg] [N/A:N/A N/A] Wound Location: [65:Gradually Appeared] [68:Gradually Appeared] [N/A:N/A] Wounding Event: [65:Venous Leg Ulcer] [68:Lymphedema]  [N/A:N/A] Primary Etiology: [65:Lymphedema] [68:N/A] [N/A:N/A] Secondary Etiology: [65:Lymphedema, Sleep Apnea,] [68:Lymphedema, Sleep Apnea,] [N/A:N/A] Comorbid History: [65:Congestive Heart Failure, Hypertension, Peripheral Venous Disease, Osteoarthritis 09/08/2019] [68:Congestive Heart Failure, Hypertension, Peripheral Venous Disease, Osteoarthritis 03/23/2020] [N/A:N/A] Date Acquired: [65:25] [68:8] [N/A:N/A] Weeks of Treatment: [65:Open] [68:Open] [N/A:N/A] Wound Status: [65:Yes] [68:No] [N/A:N/A] Clustered Wound: [65:5] [68:N/A] [N/A:N/A] Clustered Quantity: [65:6.5x5x0.1] [68:0.7x0.3x0.1] [N/A:N/A] Measurements L x W x D (cm) [65:25.525] [68:0.165] [N/A:N/A] A (cm) : rea [65:2.553] [68:0.016] [N/A:N/A] Volume (cm) : [65:95.80%] [68:90.90%] [N/A:N/A] % Reduction in Area: [65:95.80%] [68:91.20%] [N/A:N/A] % Reduction in Volume: [65:Full Thickness Without Exposed] [68:Full Thickness Without Exposed] [N/A:N/A] Classification: [65:Support Structures Medium] [68:Support Structures Small] [N/A:N/A] Exudate Amount: [65:Serosanguineous] [68:Serous] [N/A:N/A] Exudate Type: [65:red, brown] [68:amber] [N/A:N/A] Exudate Color: [65:Flat and Intact] [68:Flat and Intact] [N/A:N/A] Wound Margin: [65:Large (67-100%)] [68:Large (67-100%)] [N/A:N/A] Granulation Amount: [65:Red] [68:Red] [N/A:N/A] Granulation Quality: [65:Small (1-33%)] [68:None Present (0%)] [N/A:N/A] Necrotic Amount: [65:Fat Layer (Subcutaneous Tissue): Yes Fat Layer (Subcutaneous Tissue): Yes N/A] Exposed Structures: [65:Fascia: No Tendon: No Muscle: No Joint: No Bone: No Medium (34-66%)] [68:Fascia: No Tendon: No Muscle: No Joint: No Bone: No Large (67-100%)] [N/A:N/A] Epithelialization: [65:Compression Therapy] [68:Compression Therapy] [N/A:N/A] Treatment Notes Wound #65 (Left, Circumferential Lower Leg) 1. Cleanse With Wound Cleanser 2. Periwound Care Moisturizing lotion TCA Cream 3. Primary Dressing Applied Calcium  Alginate Ag 4. Secondary Dressing ABD Pad 6. Support Layer Applied 4 layer compression wrap Notes unna at the top to anchor. compression garment to right leg Wound #68 (Left, Proximal, Anterior Lower Leg) 1. Cleanse With Wound Cleanser 2. Periwound Care Moisturizing lotion TCA Cream 3. Primary Dressing Applied Calcium Alginate Ag 4. Secondary Dressing ABD Pad 6. Support Layer Applied 4 layer compression wrap Notes unna at the top to anchor. compression garment to right leg Electronic Signature(s) Signed: 05/18/2020 5:08:08 PM By: Linton Ham MD Signed: 05/19/2020 10:37:46 AM By: [name] RN Entered By: Linton Ham on 05/18/2020 14:03:17 -------------------------------------------------------------------------------- Multi-Disciplinary Care Plan Details Patient Name: Date of Service: [name], [place] W. 05/18/2020 12:30 PM Medical Record Number: [id number] Patient Account Number: [id number] Date of Birth/Sex: Treating RN: December 21, 1955 (64 y.o. [name] Primary Care Florestine Carmical: [name] Other Clinician: Referring Morrill Bomkamp: Treating Daltin Crist/Extender: [name] in Treatment: 25 Active Inactive Wound/Skin Impairment Nursing Diagnoses: Knowledge deficit related to ulceration/compromised skin integrity Goals: Patient/caregiver will verbalize understanding of skin care regimen Date Initiated: 11/25/2019 Target Resolution Date: 05/24/2020 Goal Status: Active Ulcer/skin breakdown will have a volume reduction of 30% by week 4 Date Initiated: 11/25/2019 Date Inactivated: 01/06/2020 Target Resolution Date: 12/26/2019 Goal Status: Met Ulcer/skin breakdown will have a volume reduction of 50% by week 8 Date Initiated: 01/06/2020 Date Inactivated: 02/17/2020 Target Resolution  Date: 02/05/2020 Goal Status: Met Interventions: Assess patient/caregiver ability to obtain necessary supplies Assess patient/caregiver ability to perform ulcer/skin care  regimen upon admission and as needed Assess ulceration(s) every visit Notes: Electronic Signature(s) Signed: 05/19/2020 10:37:46 AM By: [name] RN Entered By: [name] on 05/18/2020 13:12:40 -------------------------------------------------------------------------------- Pain Assessment Details Patient Name: Date of Service: [name] 05/18/2020 12:30 PM Medical Record Number: [id number] Patient Account Number: [id number] Date of Birth/Sex: Treating RN: Jun 28, 1956 (64 y.o. [name] Primary Care Marixa Mellott: [name] Other Clinician: Referring Cannan Beeck: Treating Averiana Clouatre/Extender: [name] in Treatment: 25 Active Problems Location of Pain Severity and Description of Pain Patient Has Paino No Site Locations Rate the pain. Current Pain Level: 0 Pain Management and Medication Current Pain Management: Electronic Signature(s) Signed: 05/18/2020 4:57:23 PM By: [name] RN, BSN Entered By: [name] on 05/18/2020 12:48:17 -------------------------------------------------------------------------------- Patient/Caregiver Education Details Patient Name: Date of Service: [name] 8/31/2021andnbsp12:30 PM Medical Record Number: [id number] Patient Account Number: [id number] Date of Birth/Gender: Treating RN: [birth year] (64 y.o. [name]) [name] Primary Care Physician: [name] Other Clinician: Referring Physician: Treating Physician/Extender: [name] in Treatment: 25 Education Assessment Education Provided To: Patient Education Topics Provided Wound/Skin Impairment: Methods: Explain/Verbal Responses: State content correctly Electronic Signature(s) Signed: 05/19/2020 10:37:46 AM By: [name] RN Entered By: [name] on 05/18/2020 13:12:58 -------------------------------------------------------------------------------- Wound Assessment Details Patient Name: Date of  Service: [name] 05/18/2020 12:30 PM Medical Record Number: [id number] Patient Account Number: [id number] Date of Birth/Sex: Treating RN: [birth year] (64 y.o. [name] Primary Care Amarrion Pastorino: [name] Other Clinician: Referring Ryver Zadrozny: Treating Jo Cerone/Extender: [name] in Treatment: 25 Wound Status Wound Number: 65 Primary Venous Leg Ulcer Etiology: Wound Location: Left, Circumferential Lower Leg Secondary Lymphedema Wounding Event: Gradually Appeared Etiology: Date Acquired: 09/08/2019 Wound Open Weeks Of Treatment: 25 Status: Clustered Wound: Yes Comorbid Lymphedema, Sleep Apnea, Congestive Heart Failure, History: Hypertension, Peripheral Venous Disease, Osteoarthritis Wound Measurements Length: (cm) 6.5 Width: (cm) 5 Depth: (cm) 0.1 Clustered Quantity: 5 Area: (cm) 25.525 Volume: (cm) 2.553 % Reduction in Area: 95.8% % Reduction in Volume: 95.8% Epithelialization: Medium (34-66%) Tunneling: No Undermining: No Wound Description Classification: Full Thickness Without Exposed Support Stru Wound Margin: Flat and Intact Exudate Amount: Medium Exudate Type: Serosanguineous Exudate Color: red, brown Wound Bed Granulation Amount: Large (67-100%) Granulation Quality: Red Necrotic Amount: Small (1-33%) Necrotic Quality: Adherent Slough ctures Foul Odor After Cleansing: No Slough/Fibrino No Exposed Structure Fascia Exposed: No Fat Layer (Subcutaneous Tissue) Exposed: Yes Tendon Exposed: No Muscle Exposed: No Joint Exposed: No Bone Exposed: No Treatment Notes Wound #65 (Left, Circumferential Lower Leg) 1. Cleanse With Wound Cleanser 2. Periwound Care Moisturizing lotion TCA Cream 3. Primary Dressing Applied Calcium Alginate Ag 4. Secondary Dressing ABD Pad 6. Support Layer Applied 4 layer compression wrap Notes unna at the top to anchor. compression garment to right leg Electronic  Signature(s) Signed: 05/18/2020 4:57:23 PM By: [name] RN, BSN Entered By: [name] on 05/18/2020 13:10:14 -------------------------------------------------------------------------------- Wound Assessment Details Patient Name: Date of Service: [name], [name] 05/18/2020 12:30 PM Medical Record Number: [id number] Patient Account Number: [id number] Date of Birth/Sex: Treating RN: June 01, 1956 (64 y.o. [name] Primary Care Alexza Norbeck: [name] Other Clinician: Referring Jakerra Floyd: Treating Eliga Arvie/Extender: [name] in Treatment: 25 Wound Status Wound Number: 68 Primary Lymphedema Etiology: Wound Location: Left, Proximal, Anterior Lower Leg Wound Open Wounding Event: Gradually  Appeared Status: Date Acquired: 03/23/2020 Comorbid Lymphedema, Sleep Apnea, Congestive Heart Failure, Weeks Of Treatment: 8 History: Hypertension, Peripheral Venous Disease, Osteoarthritis Clustered Wound: No Wound Measurements Length: (cm) 0.7 Width: (cm) 0.3 Depth: (cm) 0.1 Area: (cm) 0.165 Volume: (cm) 0.016 % Reduction in Area: 90.9% % Reduction in Volume: 91.2% Epithelialization: Large (67-100%) Tunneling: No Undermining: No Wound Description Classification: Full Thickness Without Exposed Support Struct Wound Margin: Flat and Intact Exudate Amount: Small Exudate Type: Serous Exudate Color: amber ures Wound Bed Granulation Amount: Large (67-100%) Exposed Structure Granulation Quality: Red Fascia Exposed: No Necrotic Amount: None Present (0%) Fat Layer (Subcutaneous Tissue) Exposed: Yes Tendon Exposed: No Muscle Exposed: No Joint Exposed: No Bone Exposed: No Treatment Notes Wound #68 (Left, Proximal, Anterior Lower Leg) 1. Cleanse With Wound Cleanser 2. Periwound Care Moisturizing lotion TCA Cream 3. Primary Dressing Applied Calcium Alginate Ag 4. Secondary Dressing ABD Pad 6. Support Layer Applied 4 layer compression  wrap Notes unna at the top to anchor. compression garment to right leg Electronic Signature(s) Signed: 05/18/2020 4:57:23 PM By: [name] RN, BSN Entered By: [name] on 05/18/2020 13:10:36 -------------------------------------------------------------------------------- Vitals Details Patient Name: Date of Service: [name], [name] 05/18/2020 12:30 PM Medical Record Number: [id number] Patient Account Number: [id number] Date of Birth/Sex: Treating RN: 1956-07-13 (64 y.o. [name] Primary Care Andrews Tener: [name] Other Clinician: Referring Adasyn Mcadams: Treating Bereket Gernert/Extender: [name] in Treatment: 25 Vital Signs Time Taken: 12:51 Temperature (F): 97.7 Height (in): 70 Pulse (bpm): 66 Source: Stated Respiratory Rate (breaths/min): 20 Weight (lbs): 360 Reference Range: 80 - 120 mg / dl Source: Stated Body Mass Index (BMI): 51.6 Electronic Signature(s) Signed: 05/18/2020 4:57:23 PM By: [name] RN, BSN Entered By: [name] on 05/18/2020 13:02:30

## 2020-05-21 DIAGNOSIS — I872 Venous insufficiency (chronic) (peripheral): Secondary | ICD-10-CM | POA: Diagnosis not present

## 2020-05-21 DIAGNOSIS — I5032 Chronic diastolic (congestive) heart failure: Secondary | ICD-10-CM | POA: Diagnosis not present

## 2020-05-21 DIAGNOSIS — G4733 Obstructive sleep apnea (adult) (pediatric): Secondary | ICD-10-CM | POA: Diagnosis not present

## 2020-05-21 DIAGNOSIS — L97821 Non-pressure chronic ulcer of other part of left lower leg limited to breakdown of skin: Secondary | ICD-10-CM | POA: Diagnosis not present

## 2020-05-21 DIAGNOSIS — I110 Hypertensive heart disease with heart failure: Secondary | ICD-10-CM | POA: Diagnosis not present

## 2020-05-21 DIAGNOSIS — E669 Obesity, unspecified: Secondary | ICD-10-CM | POA: Diagnosis not present

## 2020-05-25 DIAGNOSIS — E669 Obesity, unspecified: Secondary | ICD-10-CM | POA: Diagnosis not present

## 2020-05-25 DIAGNOSIS — I5032 Chronic diastolic (congestive) heart failure: Secondary | ICD-10-CM | POA: Diagnosis not present

## 2020-05-25 DIAGNOSIS — G4733 Obstructive sleep apnea (adult) (pediatric): Secondary | ICD-10-CM | POA: Diagnosis not present

## 2020-05-25 DIAGNOSIS — L97821 Non-pressure chronic ulcer of other part of left lower leg limited to breakdown of skin: Secondary | ICD-10-CM | POA: Diagnosis not present

## 2020-05-25 DIAGNOSIS — I872 Venous insufficiency (chronic) (peripheral): Secondary | ICD-10-CM | POA: Diagnosis not present

## 2020-05-25 DIAGNOSIS — I110 Hypertensive heart disease with heart failure: Secondary | ICD-10-CM | POA: Diagnosis not present

## 2020-05-28 DIAGNOSIS — I872 Venous insufficiency (chronic) (peripheral): Secondary | ICD-10-CM | POA: Diagnosis not present

## 2020-05-28 DIAGNOSIS — L97821 Non-pressure chronic ulcer of other part of left lower leg limited to breakdown of skin: Secondary | ICD-10-CM | POA: Diagnosis not present

## 2020-05-28 DIAGNOSIS — I110 Hypertensive heart disease with heart failure: Secondary | ICD-10-CM | POA: Diagnosis not present

## 2020-05-28 DIAGNOSIS — G4733 Obstructive sleep apnea (adult) (pediatric): Secondary | ICD-10-CM | POA: Diagnosis not present

## 2020-05-28 DIAGNOSIS — E669 Obesity, unspecified: Secondary | ICD-10-CM | POA: Diagnosis not present

## 2020-05-28 DIAGNOSIS — I5032 Chronic diastolic (congestive) heart failure: Secondary | ICD-10-CM | POA: Diagnosis not present

## 2020-06-01 ENCOUNTER — Other Ambulatory Visit: Payer: Self-pay

## 2020-06-01 ENCOUNTER — Encounter (HOSPITAL_BASED_OUTPATIENT_CLINIC_OR_DEPARTMENT_OTHER): Payer: Medicare Other | Attending: Internal Medicine | Admitting: Internal Medicine

## 2020-06-01 DIAGNOSIS — I509 Heart failure, unspecified: Secondary | ICD-10-CM | POA: Insufficient documentation

## 2020-06-01 DIAGNOSIS — L97811 Non-pressure chronic ulcer of other part of right lower leg limited to breakdown of skin: Secondary | ICD-10-CM | POA: Diagnosis not present

## 2020-06-01 DIAGNOSIS — L97821 Non-pressure chronic ulcer of other part of left lower leg limited to breakdown of skin: Secondary | ICD-10-CM | POA: Diagnosis not present

## 2020-06-01 DIAGNOSIS — I87333 Chronic venous hypertension (idiopathic) with ulcer and inflammation of bilateral lower extremity: Secondary | ICD-10-CM | POA: Insufficient documentation

## 2020-06-01 DIAGNOSIS — L03115 Cellulitis of right lower limb: Secondary | ICD-10-CM | POA: Diagnosis not present

## 2020-06-01 DIAGNOSIS — Z9114 Patient's other noncompliance with medication regimen: Secondary | ICD-10-CM | POA: Diagnosis not present

## 2020-06-01 DIAGNOSIS — I110 Hypertensive heart disease with heart failure: Secondary | ICD-10-CM | POA: Insufficient documentation

## 2020-06-01 DIAGNOSIS — L97822 Non-pressure chronic ulcer of other part of left lower leg with fat layer exposed: Secondary | ICD-10-CM | POA: Diagnosis not present

## 2020-06-01 DIAGNOSIS — Z8673 Personal history of transient ischemic attack (TIA), and cerebral infarction without residual deficits: Secondary | ICD-10-CM | POA: Diagnosis not present

## 2020-06-01 DIAGNOSIS — G4733 Obstructive sleep apnea (adult) (pediatric): Secondary | ICD-10-CM | POA: Insufficient documentation

## 2020-06-01 DIAGNOSIS — I890 Lymphedema, not elsewhere classified: Secondary | ICD-10-CM | POA: Insufficient documentation

## 2020-06-04 DIAGNOSIS — E669 Obesity, unspecified: Secondary | ICD-10-CM | POA: Diagnosis not present

## 2020-06-04 DIAGNOSIS — G4733 Obstructive sleep apnea (adult) (pediatric): Secondary | ICD-10-CM | POA: Diagnosis not present

## 2020-06-04 DIAGNOSIS — L97821 Non-pressure chronic ulcer of other part of left lower leg limited to breakdown of skin: Secondary | ICD-10-CM | POA: Diagnosis not present

## 2020-06-04 DIAGNOSIS — I5032 Chronic diastolic (congestive) heart failure: Secondary | ICD-10-CM | POA: Diagnosis not present

## 2020-06-04 DIAGNOSIS — I872 Venous insufficiency (chronic) (peripheral): Secondary | ICD-10-CM | POA: Diagnosis not present

## 2020-06-04 DIAGNOSIS — I110 Hypertensive heart disease with heart failure: Secondary | ICD-10-CM | POA: Diagnosis not present

## 2020-06-07 NOTE — Progress Notes (Signed)
[name] [name] ([id number]) , Visit Report for 06/01/2020 Arrival Information Details Patient Name: Date of Service: [name] [name] RDIN W. 06/01/2020 12:30 PM Medical Record Number: [id number] Patient Account Number: [id number] Date of Birth/Sex: Treating RN: 10-Aug-1956 (64 y.o. [name] [name]) [name] Primary Care Vivi Piccirilli: [name] Other Clinician: Referring Gauri Galvao: Treating Aamir Mclinden/Extender: [name] in Treatment: 37 Visit Information History Since Last Visit Added or deleted any medications: No Patient Arrived: [name]: No Arrival Time: 12:37 Had a fall or experienced change in No Accompanied By: self activities of daily living that may affect Transfer Assistance: None risk of falls: Patient Requires Transmission-Based Precautions: No Signs or symptoms of abuse/neglect since last visito No Patient Has Alerts: Yes Hospitalized since last visit: No Patient Alerts: R ABI = 1.25 Implantable device outside of the clinic excluding No L ABI = 1.27 cellular tissue based products placed in the center since last visit: Has Dressing in Place as Prescribed: Yes Pain Present Now: No Electronic Signature(s) Signed: 06/02/2020 8:07:26 AM By: Sandre Kitty Entered By: Sandre Kitty on 06/01/2020 12:38:49 -------------------------------------------------------------------------------- Compression Therapy Details Patient Name: Date of Service: [name] [name], Lamonte Sakai RDIN W. 06/01/2020 12:30 PM Medical Record Number: [id number] Patient Account Number: [id number] Date of Birth/Sex: Treating RN: [birth year] (64 y.o. [name] [name] Primary Care Keiarah Orlowski: [name] Other Clinician: Referring Winter Trefz: Treating Maleki Hippe/Extender: [name] in Treatment: 27 Compression Therapy Performed for Wound Assessment: Non-Wound Location Performed By: Clinician [name], RN Compression Type: Four Layer Location:  Lower Extremity, Right Post Procedure Diagnosis Same as Pre-procedure Electronic Signature(s) Signed: 06/07/2020 1:15:48 PM By: [name] RN Entered By: [name] on 06/01/2020 13:25:00 -------------------------------------------------------------------------------- Compression Therapy Details Patient Name: Date of Service: [name] [name] RDIN W. 06/01/2020 12:30 PM Medical Record Number: [id number] Patient Account Number: [id number] Date of Birth/Sex: Treating RN: Oct 15, 1955 (64 y.o. [name] [name] Primary Care Che Rachal: [name] Other Clinician: Referring Yoana Staib: Treating Marshayla Mitschke/Extender: [name] in Treatment: 27 Compression Therapy Performed for Wound Assessment: Wound #65 Left,Circumferential Lower Leg Performed By: Clinician [name], RN Compression Type: Four Layer Post Procedure Diagnosis Same as Pre-procedure Electronic Signature(s) Signed: 06/07/2020 1:15:48 PM By: [name] RN Entered By: [name] on 06/01/2020 13:25:20 -------------------------------------------------------------------------------- Compression Therapy Details Patient Name: Date of Service: [name] [name] RDIN W. 06/01/2020 12:30 PM Medical Record Number: [id number] Patient Account Number: [id number] Date of Birth/Sex: Treating RN: 1956/07/04 (64 y.o. [name] [name] Primary Care Jaekwon Mcclune: [name] Other Clinician: Referring Vicent Febles: Treating Chais Fehringer/Extender: [name] in Treatment: 27 Compression Therapy Performed for Wound Assessment: Wound #68 Left,Proximal,Anterior Lower Leg Performed By: Clinician [name], RN Compression Type: Four Layer Post Procedure Diagnosis Same as Pre-procedure Electronic Signature(s) Signed: 06/07/2020 1:15:48 PM By: [name] RN Entered By: [name] on 06/01/2020 13:25:20 -------------------------------------------------------------------------------- Encounter Discharge  Information Details Patient Name: Date of Service: [name] [name], [name] 06/01/2020 12:30 PM Medical Record Number: [id number] Patient Account Number: [id number] Date of Birth/Sex: Treating RN: 10/11/1955 (64 y.o. [name] [name] Primary Care Traevon Meiring: [name] Other Clinician: Referring Eyvette Cordon: Treating Oren Barella/Extender: [name] in Treatment: 27 Encounter Discharge Information Items Discharge Condition: Stable Ambulatory Status: Cane Discharge Destination: Home Transportation: Private Auto Accompanied By: self Schedule Follow-up Appointment: Yes Clinical Summary of Care: Patient Declined Electronic Signature(s) Signed: 06/03/2020 4:34:39 PM By: Kela Millin Entered By: Kela Millin on 06/01/2020 13:43:41 -------------------------------------------------------------------------------- Lower Extremity Assessment Details Patient Name: Date  of Service: [name] [name] RDIN W. 06/01/2020 12:30 PM Medical Record Number: [id number] Patient Account Number: [id number] Date of Birth/Sex: Treating RN: 01/07/1956 (64 y.o. [name] [name] Primary Care Braheem Tomasik: [name] Other Clinician: Referring Travion Ke: Treating Howie Rufus/Extender: [name] in Treatment: 27 Edema Assessment Assessed: [name]: No] [name]: No] Edema: [Left: Yes] [Right: Yes] Calf Left: Right: Point of Measurement: 30 cm From Medial Instep 41 cm 44 cm Ankle Left: Right: Point of Measurement: 13 cm From Medial Instep 25.5 cm 27.3 cm Vascular Assessment Pulses: Dorsalis Pedis Palpable: [Left:No] [Right:No] Electronic Signature(s) Signed: 06/03/2020 4:34:39 PM By: Kela Millin Entered By: Kela Millin on 06/01/2020 13:01:14 -------------------------------------------------------------------------------- Multi Wound Chart Details Patient Name: Date of Service: [name] [name], [place] W. 06/01/2020 12:30 PM Medical Record Number:  [id number] Patient Account Number: [id number] Date of Birth/Sex: Treating RN: 1956/06/17 (64 y.o. [name] [name]) [name] Primary Care Larkyn Greenberger: [name] Other Clinician: Referring Patte Winkel: Treating Bevin Das/Extender: [name] in Treatment: 27 Vital Signs Height(in): 70 Pulse(bpm): 69 Weight(lbs): 360 Blood Pressure(mmHg): 152/75 Body Mass Index(BMI): 52 Temperature(F): 98.1 Respiratory Rate(breaths/min): 20 Photos: [65:No Photos Left, Circumferential Lower Leg] [68:No Photos Left, Proximal, Anterior Lower Leg] [N/A:N/A N/A] Wound Location: [65:Gradually Appeared] [68:Gradually Appeared] [N/A:N/A] Wounding Event: [65:Venous Leg Ulcer] [68:Lymphedema] [N/A:N/A] Primary Etiology: [65:Lymphedema] [68:N/A] [N/A:N/A] Secondary Etiology: [65:Lymphedema, Sleep Apnea,] [68:Lymphedema, Sleep Apnea,] [N/A:N/A] Comorbid History: [65:Congestive Heart Failure, Hypertension, Peripheral Venous Disease, Osteoarthritis 09/08/2019] [68:Congestive Heart Failure, Hypertension, Peripheral Venous Disease, Osteoarthritis 03/23/2020] [N/A:N/A] Date Acquired: [65:27] [68:10] [N/A:N/A] Weeks of Treatment: [65:Open] [68:Open] [N/A:N/A] Wound Status: [65:Yes] [68:No] [N/A:N/A] Clustered Wound: [65:5] [68:N/A] [N/A:N/A] Clustered Quantity: [65:15.5x13x0.1] [68:0.8x0.5x0.1] [N/A:N/A] Measurements L x W x D (cm) [65:158.258] [02:5.852] [N/A:N/A] A (cm) : rea [77:82.423] [68:0.031] [N/A:N/A] Volume (cm) : [65:74.30%] [68:82.70%] [N/A:N/A] % Reduction in Area: [65:74.30%] [68:82.90%] [N/A:N/A] % Reduction in Volume: [65:Full Thickness Without Exposed] [68:Full Thickness Without Exposed] [N/A:N/A] Classification: [65:Support Structures Large] [68:Support Structures Small] [N/A:N/A] Exudate Amount: [65:Serosanguineous] [68:Serous] [N/A:N/A] Exudate Type: [65:red, brown] [68:amber] [N/A:N/A] Exudate Color: [65:Flat and Intact] [68:Flat and Intact] [N/A:N/A] Wound Margin: [65:Large (67-100%)]  [68:Large (67-100%)] [N/A:N/A] Granulation Amount: [65:Red] [68:Red] [N/A:N/A] Granulation Quality: [65:Small (1-33%)] [68:None Present (0%)] [N/A:N/A] Necrotic Amount: [65:Fat Layer (Subcutaneous Tissue): Yes Fat Layer (Subcutaneous Tissue): Yes N/A] Exposed Structures: [65:Fascia: No Tendon: No Muscle: No Joint: No Bone: No None] [68:Fascia: No Tendon: No Muscle: No Joint: No Bone: No Large (67-100%)] [N/A:N/A] Treatment Notes Electronic Signature(s) Signed: 06/01/2020 5:12:45 PM By: Linton Ham MD Signed: 06/07/2020 1:15:48 PM By: [name] RN Entered By: Linton Ham on 06/01/2020 13:19:28 -------------------------------------------------------------------------------- Multi-Disciplinary Care [name] Details Patient Name: Date of Service: [name] [name], [name] 06/01/2020 12:30 PM Medical Record Number: [id number] Patient Account Number: [id number] Date of Birth/Sex: Treating RN: 06/02/1956 (64 y.o. [name] [name] Primary Care Halle Davlin: [name] Other Clinician: Referring Leathie Weich: Treating Maleka Contino/Extender: [name] in Treatment: 27 Active Inactive Wound/Skin Impairment Nursing Diagnoses: Knowledge deficit related to ulceration/compromised skin integrity Goals: Patient/caregiver will verbalize understanding of skin care regimen Date Initiated: 11/25/2019 Target Resolution Date: 06/23/2020 Goal Status: Active Ulcer/skin breakdown will have a volume reduction of 30% by week 4 Date Initiated: 11/25/2019 Date Inactivated: 01/06/2020 Target Resolution Date: 12/26/2019 Goal Status: Met Ulcer/skin breakdown will have a volume reduction of 50% by week 8 Date Initiated: 01/06/2020 Date Inactivated: 02/17/2020 Target Resolution Date: 02/05/2020 Goal Status: Met Interventions: Assess patient/caregiver ability to obtain necessary supplies Assess patient/caregiver ability to perform ulcer/skin care regimen upon admission and as needed Assess  ulceration(s)  every visit Notes: Electronic Signature(s) Signed: 06/07/2020 1:15:48 PM By: [name] RN Entered By: [name] on 06/01/2020 12:53:06 -------------------------------------------------------------------------------- Pain Assessment Details Patient Name: Date of Service: [name] [name] RDIN W. 06/01/2020 12:30 PM Medical Record Number: [id number] Patient Account Number: [id number] Date of Birth/Sex: Treating RN: 1955-11-13 (64 y.o. [name] [name]) [name] Primary Care Shontavia Mickel: [name] Other Clinician: Referring Saarah Dewing: Treating Javarie Crisp/Extender: [name] in Treatment: 27 Active Problems Location of Pain Severity and Description of Pain Patient Has Paino No Site Locations Pain Management and Medication Current Pain Management: Electronic Signature(s) Signed: 06/02/2020 8:07:26 AM By: Sandre Kitty Signed: 06/07/2020 1:15:48 PM By: [name] RN Entered By: Sandre Kitty on 06/01/2020 12:40:06 -------------------------------------------------------------------------------- Patient/Caregiver Education Details Patient Name: Date of Service: [name] [name] 9/14/2021andnbsp12:30 PM Medical Record Number: [id number] Patient Account Number: [id number] Date of Birth/Gender: Treating RN: 1956-02-05 (64 y.o. [name] [name] Primary Care Physician: [name] Other Clinician: Referring Physician: Treating Physician/Extender: [name] in Treatment: 27 Education Assessment Education Provided To: Patient Education Topics Provided Wound/Skin Impairment: Methods: Explain/Verbal Responses: State content correctly Electronic Signature(s) Signed: 06/07/2020 1:15:48 PM By: [name] RN Entered By: [name] on 06/01/2020 12:53:25 -------------------------------------------------------------------------------- Wound Assessment Details Patient Name: Date of Service: [name] [name] RDIN W. 06/01/2020 12:30 PM Medical  Record Number: [id number] Patient Account Number: [id number] Date of Birth/Sex: Treating RN: Apr 12, 1956 (64 y.o. [name] [name] Primary Care Addis Bennie: [name] Other Clinician: Referring Shary Lamos: Treating Jiana Lemaire/Extender: [name] in Treatment: 27 Wound Status Wound Number: 69 Primary Venous Leg Ulcer Etiology: Wound Location: Left, Circumferential Lower Leg Secondary Lymphedema Wounding Event: Gradually Appeared Etiology: Date Acquired: 09/08/2019 Wound Open Weeks Of Treatment: 27 Status: Clustered Wound: Yes Comorbid Lymphedema, Sleep Apnea, Congestive Heart Failure, History: Hypertension, Peripheral Venous Disease, Osteoarthritis Photos Photo Uploaded By: [name] [name] on 06/03/2020 11:44:46 Wound Measurements Length: (cm) 15.5 Width: (cm) 13 Depth: (cm) 0.1 Clustered Quantity: 5 Area: (cm) 158.258 Volume: (cm) 15.826 % Reduction in Area: 74.3% % Reduction in Volume: 74.3% Epithelialization: None Tunneling: No Undermining: No Wound Description Classification: Full Thickness Without Exposed Support Structures Wound Margin: Flat and Intact Exudate Amount: Large Exudate Type: Serosanguineous Exudate Color: red, brown Foul Odor After Cleansing: No Slough/Fibrino No Wound Bed Granulation Amount: Large (67-100%) Exposed Structure Granulation Quality: Red Fascia Exposed: No Necrotic Amount: Small (1-33%) Fat Layer (Subcutaneous Tissue) Exposed: Yes Necrotic Quality: Adherent Slough Tendon Exposed: No Muscle Exposed: No Joint Exposed: No Bone Exposed: No Treatment Notes Wound #65 (Left, Circumferential Lower Leg) 1. Cleanse With Wound Cleanser Soap and water 3. Primary Dressing Applied Calcium Alginate Ag 4. Secondary Dressing ABD Pad 6. Support Layer Applied 4 layer compression wrap Notes stockinette. unna to top [employer]) Signed: 06/03/2020 4:34:39 PM By: Kela Millin Signed: 06/07/2020 1:15:48 PM  By: [name] RN Entered By: Kela Millin on 06/01/2020 13:01:38 -------------------------------------------------------------------------------- Wound Assessment Details Patient Name: Date of Service: [name] [name], [place] W. 06/01/2020 12:30 PM Medical Record Number: [id number] Patient Account Number: [id number] Date of Birth/Sex: Treating RN: 05-Jul-1956 (64 y.o. [name] [name] Primary Care Mishawn Hemann: [name] Other Clinician: Referring Dema Timmons: Treating Margaretmary Prisk/Extender: [name] in Treatment: 27 Wound Status Wound Number: 68 Primary Lymphedema Etiology: Wound Location: Left, Proximal, Anterior Lower Leg Wound Open Wounding Event: Gradually Appeared Status: Date Acquired: 03/23/2020 Comorbid Lymphedema, Sleep Apnea, Congestive Heart Failure, Weeks Of Treatment: 10 History: Hypertension, Peripheral Venous Disease, Osteoarthritis Clustered Wound: No Photos  Photo Uploaded By: [name] [name] on 06/03/2020 11:44:46 Wound Measurements Length: (cm) 0.8 Width: (cm) 0.5 Depth: (cm) 0.1 Area: (cm) 0.314 Volume: (cm) 0.031 % Reduction in Area: 82.7% % Reduction in Volume: 82.9% Epithelialization: Large (67-100%) Tunneling: No Undermining: No Wound Description Classification: Full Thickness Without Exposed Support Struc Wound Margin: Flat and Intact Exudate Amount: Small Exudate Type: Serous Exudate Color: amber tures Wound Bed Granulation Amount: Large (67-100%) Exposed Structure Granulation Quality: Red Fascia Exposed: No Necrotic Amount: None Present (0%) Fat Layer (Subcutaneous Tissue) Exposed: Yes Tendon Exposed: No Muscle Exposed: No Joint Exposed: No Bone Exposed: No Treatment Notes Wound #68 (Left, Proximal, Anterior Lower Leg) 1. Cleanse With Wound Cleanser Soap and water 3. Primary Dressing Applied Calcium Alginate Ag 4. Secondary Dressing ABD Pad 6. Support Layer Applied 4 layer compression  wrap Notes stockinette. unna to top Electronic Signature(s) Signed: 06/03/2020 4:34:39 PM By: Kela Millin Signed: 06/07/2020 1:15:48 PM By: [name] RN Entered By: Kela Millin on 06/01/2020 13:02:01 -------------------------------------------------------------------------------- Vitals Details Patient Name: Date of Service: [name] [name], [name] 06/01/2020 12:30 PM Medical Record Number: [id number] Patient Account Number: [id number] Date of Birth/Sex: Treating RN: Aug 12, 1956 (64 y.o. [name] [name]) [name] Primary Care Shawnya Mayor: [name] Other Clinician: Referring Usama Harkless: Treating Kenroy Timberman/Extender: [name] in Treatment: 27 Vital Signs Time Taken: 12:38 Temperature (F): 98.1 Height (in): 70 Pulse (bpm): 69 Weight (lbs): 360 Respiratory Rate (breaths/min): 20 Body Mass Index (BMI): 51.6 Blood Pressure (mmHg): 152/75 Reference Range: 80 - 120 mg / dl Electronic Signature(s) Signed: 06/02/2020 8:07:26 AM By: Sandre Kitty Entered By: Sandre Kitty on 06/01/2020 12:40:00

## 2020-06-07 NOTE — Progress Notes (Signed)
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 control is not too bad but I certainly do not think this is going to heal. He will not use compression pumps. Silver alginate is the primary dressing 4/14; monthly follow-up. Patient is largely deteriorated he has a smattering of multiple open small areas on the left lateral calf with areas of denuded full- thickness skin. On the right he is not as bad some surface eschar and debris small  areas. We have been using silver alginate under 4-layer compression. Miraculously he still has home health changing these dressings i.e. [facility] 5/12; monthly follow-up. Much better condition of the edema in his bilateral lower legs. He has home health using 4 layer compression and he states he uses his compression pumps every second day 6/12; monthly follow-up. He has decent edema control. He only has a small superficial area on the right leg a large number of small wounds on the left leg. He is not using his compression pumps. He has home health changing his dressings READMISSION 06/13/2019 [name] [name] is now a 64 year old man. He has a long history of chronic venous insufficiency with chronic stasis dermatitis and lymphedema. He was last in this clinic in June at that point he had a small superficial area on the right leg and the larger number of small wounds on the left. He has been using silver alginate and and 4-layer compression. He still has [facility] home health  care coming out. He tells me he went to [facility] wound care twice. They healed him out after that he does not think he was actually healed. His wife at the time was in [place] after falling and fracturing her femur by the sound of it. She is currently in a nursing home in [place] He comes into clinic today with large areas of superficial denuded epithelium which is almost circumferential on the right and a large area on the left lateral. His edema control is marginal. He is not in any pain. Past medical history includes congestive heart failure, previous venous ablation, lymphedema and obstructive sleep apnea. He has compression pumps at home but he has been completely noncompliant with this by his own admission. He is not a diabetic. ABIs in our clinic were 1.27 on the right and 1.25 on the left we do not have time for this this afternoon I am and I am not comfortable 10/9; the patient arrives with green drainage under the compression  right greater than left. He is not complaining of any pain. He also almost circumferential epithelial loss on the right. He has home health changing the dressing. We are doing 2-week follow-ups. He lives in [place] 10/23; 2-week follow-up. The patient took his antibiotics he seems to have tolerated this well. He has still areas on the right and left calf left more substantially. I think he has some improvement in the epithelialization. He has new wounds on the left dorsal foot today. He says he has been using compression pumps 11/6; two-week follow-up. The patient arrived still with wounds mostly on his lateral lower legs. He has a new area on the right dorsal foot today just in close proximity to his toes. His edema control is marginal. He will not use his compression pumps. He has home health changing the dressings. He tells [place] that his wife is in the hospital in [place] with heart failure. I suspect he is sitting at her bedside for most the day. His legs are probably dependent. 12/4; 1 month follow-up. He arrives with everything on his legs completely closed. He has some form of external compression garment at home as well as compression pumps. READMISSION 11/25/2019 We discharge this patient on 08/22/2019 with everything on his bilateral lower legs closed. He had an external compression stocking for both legs at home as well as compression pumps although admittedly he has never been compliant with the latter. He states his legs stay closed for about 2 or 3 weeks. He ended up in hospital at [facility] from 12/22 through 12/29 with Covid infection. He came home and is gradually been regaining his strength. Currently has bilateral innumerable wounds almost circumferentially on both lower legs in the setting of severe venous inflammation but no current infection. The patient has diastolic heart failure hypertension history of TIA chronic venous ulcers had obstructive sleep apnea although he is not  compliant with CPAP. He was never felt to have an arterial issue in this clinic his pedal pulses and ABIs have been normal most recently in September/20 at 1.25 on the right and 1.27 on the left 3/23; he arrives with much less edema in both legs. He has home health changing the dressings. Been using silver alginate. He only has an open area along the wrap line of both legs. The rest of this seems to be closed. 4/6; better edema control in our compression wraps. He has not been using his external compression pumps he tells me because his wife  was hospitalized for about 10 days. We have been using silver alginate on the wounds. 4/20; he has good edema control and compression wraps. His wife is back at [facility] he says he has not been using the external compression pumps. Silver alginate to the wounds 5/4; he has good edema control bilaterally. He has not been using the compression pumps he tells [place] his wife is coming home from [place] today. He does not have any open wounds on the right leg continued large collection of small wounds on the left anterior leg extending medially into laterally nothing much posteriorly on the left. 6/1. He has a smattering of small wounds anteriorly on the left and a larger but superficial wound on the left posterior calf. There is nothing open on the right we have been using silver alginate on the left I will change that the [facility] today 6/22; there is nothing open on his right leg. He has a smattering of small eschars on the left anterior lower leg but no open wounds per se. Somebody applied the compression wrap on the right leg in a very irregular fashion. He has a very tender area on the right medial ankle. 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 Not surprisingly comes back in with wounds on his bilateral legs at the level of where his external compression garment was tight superiorly.  He tells me he is using his pumps once every second day. 7/20; 2-week follow-up the patient has not been using his compression pumps his wife was transferred to a new nursing home. He has 1 wound on the medial left lower leg and a small area on the right lateral 8/17; patient arrives today with only a smattering of small wounds on the left anterior lower leg most of these are eschared and look benign. There is nothing on his right leg. We have been using silver alginate under 4-layer compression 8/31; patient's wounds on the left anterior lower leg are larger. There is still nothing open on the right toe although we did put compression wraps on this last time. He has home health. Says he is used his compression pumps twice in the last 3 days. Obviously not sufficient 9/14; 2-week follow-up. We discharged him in his own zippered stocking. Apparently home health helped him change this and noted weeping edema fluid therefore put him back in a wrap he arrives in the clinic today with no open wounds on the right innumerable small broken areas on the anterior left calf. Uncontrolled edema above the level of the wrap compatible with lymphedema Objective Constitutional Patient is hypertensive.. Pulse regular and within target range for patient.Marland Kitchen Respirations regular, non-labored and within target range.. Temperature is normal and within the target range for the patient.Marland Kitchen Appears in no distress. Vitals Time Taken: 12:38 PM, Height: 70 in, Weight: 360 lbs, BMI: 51.6, Temperature: 98.1 F, Pulse: 69 bpm, Respiratory Rate: 20 breaths/min, Blood Pressure: 152/75 mmHg. General Notes: Wound exam; again these seem innumerable collection of small open weeping wounds on the left anterior lower leg. Just above where the wrap would and he has large amounts of lymphedema this will not heal in this setting. ooHe does not have any open areas on the right leg ooNo evidence of arterial insufficiency in either  leg Integumentary (Hair, Skin) Wound #65 status is Open. Original cause of wound was Gradually Appeared. The wound is located on the Left,Circumferential Lower Leg. The wound  measures 15.5cm length x 13cm width x 0.1cm depth; 158.258cm^2 area and 15.826cm^3 volume. There is Fat Layer (Subcutaneous Tissue) exposed. There is no tunneling or undermining noted. There is a large amount of serosanguineous drainage noted. The wound margin is flat and intact. There is large (67-100%) red granulation within the wound bed. There is a small (1-33%) amount of necrotic tissue within the wound bed including Adherent Slough. Wound #68 status is Open. Original cause of wound was Gradually Appeared. The wound is located on the Left,Proximal,Anterior Lower Leg. The wound measures 0.8cm length x 0.5cm width x 0.1cm depth; 0.314cm^2 area and 0.031cm^3 volume. There is Fat Layer (Subcutaneous Tissue) exposed. There is no tunneling or undermining noted. There is a small amount of serous drainage noted. The wound margin is flat and intact. There is large (67-100%) red granulation within the wound bed. There is no necrotic tissue within the wound bed. 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 Dressing Change Frequency: Other: - twice a week Skin Barriers/Peri-Wound Care: Moisturizing lotion Wound Cleansing: May shower with protection. Primary Wound Dressing: Wound #65 Left,Circumferential Lower Leg: Calcium Alginate with Silver Wound #68 Left,Proximal,Anterior Lower Leg: Calcium Alginate with Silver Secondary  Dressing: Wound #65 Left,Circumferential Lower Leg: ABD pad Wound #68 Left,Proximal,Anterior Lower Leg: ABD pad Edema Control: 4 layer compression - Bilateral Elevate legs to the level of the heart or above for 30 minutes daily and/or when sitting, a frequency of: Support Garment 20-30 mm/Hg pressure to: - right lower leg , on in the AM off in the PM Segmental Compressive Device. - lymphedema pumps twice a day for 1 hour each time Home Health: Wound #65 Left,Circumferential Lower Leg: Continue Home Health skilled nursing for wound care. - [facility] 1. I continue with silver alginate to the areas on the left leg ABDs under 4-layer compression 2. I put the right leg back in 4 layer compression 3. I have told the patient I want this compression pump usage at 1 hour twice a day. Without this we are not going to be able to maintain skin integrity and if he refuses I will consider discharging him. 4. He may need a different type of external compression stocking on the right leg. I will ask about that next week Electronic Signature(s) Signed: 06/01/2020 5:12:45 PM By: Linton Ham MD Entered By: Linton Ham on 06/01/2020 13:25:46 -------------------------------------------------------------------------------- SuperBill Details Patient Name: Date of Service: [name] [name], [name] W. 06/01/2020 Medical Record Number: [id number] Patient Account Number: [id number] Date of Birth/Sex: Treating RN: 1956-05-08 (64 y.o. 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 Modifier: Quantity:  1 Physician Procedures : CPT4 Code Description Modifier [id number] - WC PHYS LEVEL 3 - EST PT ICD-10 Diagnosis Description I87.333 Chronic venous hypertension (idiopathic) with ulcer and inflammation of bilateral lower extremity I89.0 Lymphedema, not elsewhere classified  L97.821 Non-pressure chronic ulcer of other part of left lower leg limited to breakdown of skin L97.811 Non-pressure chronic ulcer of other part of right lower leg limited to breakdown of skin Quantity: 1 Electronic Signature(s) Signed: 06/01/2020 5:12:45 PM By: Linton Ham MD Entered By: Linton Ham on 06/01/2020 13:26:17

## 2020-06-08 DIAGNOSIS — I110 Hypertensive heart disease with heart failure: Secondary | ICD-10-CM | POA: Diagnosis not present

## 2020-06-08 DIAGNOSIS — E669 Obesity, unspecified: Secondary | ICD-10-CM | POA: Diagnosis not present

## 2020-06-08 DIAGNOSIS — G4733 Obstructive sleep apnea (adult) (pediatric): Secondary | ICD-10-CM | POA: Diagnosis not present

## 2020-06-08 DIAGNOSIS — L97821 Non-pressure chronic ulcer of other part of left lower leg limited to breakdown of skin: Secondary | ICD-10-CM | POA: Diagnosis not present

## 2020-06-08 DIAGNOSIS — I5032 Chronic diastolic (congestive) heart failure: Secondary | ICD-10-CM | POA: Diagnosis not present

## 2020-06-08 DIAGNOSIS — I872 Venous insufficiency (chronic) (peripheral): Secondary | ICD-10-CM | POA: Diagnosis not present

## 2020-06-15 ENCOUNTER — Encounter (HOSPITAL_BASED_OUTPATIENT_CLINIC_OR_DEPARTMENT_OTHER): Payer: Medicare Other | Admitting: Internal Medicine

## 2020-06-15 ENCOUNTER — Other Ambulatory Visit: Payer: Self-pay

## 2020-06-15 DIAGNOSIS — L03115 Cellulitis of right lower limb: Secondary | ICD-10-CM | POA: Diagnosis not present

## 2020-06-15 DIAGNOSIS — I87333 Chronic venous hypertension (idiopathic) with ulcer and inflammation of bilateral lower extremity: Secondary | ICD-10-CM | POA: Diagnosis not present

## 2020-06-15 DIAGNOSIS — L97821 Non-pressure chronic ulcer of other part of left lower leg limited to breakdown of skin: Secondary | ICD-10-CM | POA: Diagnosis not present

## 2020-06-15 DIAGNOSIS — L97811 Non-pressure chronic ulcer of other part of right lower leg limited to breakdown of skin: Secondary | ICD-10-CM | POA: Diagnosis not present

## 2020-06-15 DIAGNOSIS — I890 Lymphedema, not elsewhere classified: Secondary | ICD-10-CM | POA: Diagnosis not present

## 2020-06-15 NOTE — Progress Notes (Signed)
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 severe chronic venous insufficiency and secondary lymphedema. He has compression pumps and does not use them. He has much improved wounds on the bilateral lower legs. He arrives for monthly follow-up. 3/13; monthly follow-up. Patient's legs look much the same bilateral scattering of small wounds but with tremendous leaking lymphedema. His edema control is not too bad but I certainly do  not think this is going to heal. He will not use compression pumps. Silver alginate is the primary dressing 4/14; monthly follow-up. Patient is largely deteriorated he has a smattering of multiple open small areas on the left lateral calf with areas of denuded full- thickness skin. On the right he is not as bad some surface eschar and debris small areas. We have been using silver alginate under 4-layer compression. Miraculously he still has home health changing these dressings i.e. [facility] 5/12; monthly follow-up. Much better condition of the edema in his bilateral lower legs. He has home health using 4 layer compression and he states he uses his compression pumps every second day 6/12; monthly follow-up. He has decent edema control. He only has a small superficial area on the right leg a large number of small wounds on the left leg. He is not using his compression pumps. He has home health changing his dressings READMISSION 06/13/2019 [name] [name] is now a 64 year old man. He  has a long history of chronic venous insufficiency with chronic stasis dermatitis and lymphedema. He was last in this clinic in June at that point he had a small superficial area on the right leg and the larger number of small wounds on the left. He has been using silver alginate and and 4-layer compression. He still has [facility] home health care coming out. He tells me he went to [facility] wound care twice. They healed him out after that he does not think he was actually healed. His wife at the time was in [place] after falling and fracturing her femur by the sound of it. She is currently in a nursing home in [place] He comes into clinic today with large areas of superficial denuded epithelium which is almost circumferential on the right and a large area on the left lateral. His edema control is marginal. He is not in any pain. Past medical history includes congestive heart failure, previous venous ablation, lymphedema and  obstructive sleep apnea. He has compression pumps at home but he has been completely noncompliant with this by his own admission. He is not a diabetic. ABIs in our clinic were 1.27 on the right and 1.25 on the left we do not have time for this this afternoon I am and I am not comfortable 10/9; the patient arrives with green drainage under the compression right greater than left. He is not complaining of any pain. He also almost circumferential epithelial loss on the right. He has home health changing the dressing. We are doing 2-week follow-ups. He lives in [place] 10/23; 2-week follow-up. The patient took his antibiotics he seems to have tolerated this well. He has still areas on the right and left calf left more substantially. I think he has some improvement in the epithelialization. He has new wounds on the left dorsal foot today. He says he has been using compression pumps 11/6; two-week follow-up. The patient arrived still with wounds mostly on his lateral lower legs. He has a new area on the right dorsal foot today just in close proximity to his toes. His edema control is marginal. He will not use his compression pumps. He has home health changing the dressings. He tells [place] that his wife is in the hospital in [place] with heart failure. I suspect he is sitting at her bedside for most the day. His legs are probably dependent. 12/4; 1 month follow-up. He arrives with everything on his legs completely closed. He has some form of external compression garment at home as well as compression pumps. READMISSION 11/25/2019 We discharge this patient on 08/22/2019 with everything on his bilateral lower legs closed. He had an external compression stocking for both legs at home as well as compression pumps although admittedly he has never been compliant with the latter. He states his legs stay closed for about 2 or 3 weeks. He ended up in hospital at [place] from 12/22 through 12/29 with Covid  infection. He came home and is gradually been regaining his strength. Currently has bilateral innumerable wounds almost circumferentially on both lower legs in the setting of severe venous inflammation but no current infection. The patient has diastolic heart failure hypertension history of TIA chronic venous ulcers had obstructive sleep apnea although he is not compliant with CPAP. He was never felt to have an arterial issue in this clinic his pedal pulses and ABIs have been normal most recently in September/20 at 1.25 on the right and 1.27 on the left 3/23; he arrives with  much less edema in both legs. He has home health changing the dressings. Been using silver alginate. He only has an open area along the wrap line of both legs. The rest of this seems to be closed. 4/6; better edema control in our compression wraps. He has not been using his external compression pumps he tells me because his wife was hospitalized for about 10 days. We have been using silver alginate on the wounds. 4/20; he has good edema control and compression wraps. His wife is back at [facility] he says he has not been using the external compression pumps. Silver alginate to the wounds 5/4; he has good edema control bilaterally. He has not been using the compression pumps he tells [place] his wife is coming home from [place] today. He does not have any open wounds on the right leg continued large collection of small wounds on the left anterior leg extending medially into laterally nothing much posteriorly on the left. 6/1. He has a smattering of small wounds anteriorly on the left and a larger but superficial wound on the left posterior calf. There is nothing open on the right we have been using silver alginate on the left I will change that the [facility] today 6/22; there is nothing open on his right leg. He has a smattering of small eschars on the left anterior lower leg but no open wounds per se. Somebody applied the  compression wrap on the right leg in a very irregular fashion. He has a very tender area on the right medial ankle. 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 Not surprisingly comes back in with wounds on his bilateral legs at the level of where his external compression garment was tight superiorly. He tells me he is using his pumps once every second day. 7/20; 2-week follow-up the patient has not been using his compression pumps his wife was transferred to a new nursing home. He has 1 wound on the medial left lower leg and a small area on the right lateral 8/17; patient arrives today with only a smattering of small wounds on the left anterior lower leg most of these are eschared and look benign. There is nothing on his right leg. We have been using silver alginate under 4-layer compression 8/31; patient's wounds on the left anterior lower leg are larger. There is still nothing open on the right toe although we did put compression wraps on this last time. He has home health. Says he is used his compression pumps twice in the last 3 days. Obviously not sufficient 9/14; 2-week follow-up. We discharged him in his own zippered stocking. Apparently home health helped him change this and noted weeping edema fluid therefore put him back in a wrap he arrives in the clinic today with no open wounds on the right innumerable small broken areas on the anterior left calf. Uncontrolled edema above the level of the wrap compatible with lymphedema 9/27 2-week follow-up. He arrives today with the left anterior lower extremity looking better. On the right he has a small open area posteriorly. I am going to put him back in compression on both sides. He claims compliance with his compression pumps at home twice a day he has home health changing his dressing Objective Constitutional Patient is hypertensive.. Pulse regular and within target range for  patient.Marland Kitchen Respirations regular, non-labored and within target range.. Temperature is normal and within the target range for the patient.Marland Kitchen  Appears in no distress. Vitals Time Taken: 12:50 PM, Height: 70 in, Weight: 360 lbs, BMI: 51.6, Temperature: 97.5 F, Pulse: 67 bpm, Respiratory Rate: 20 breaths/min, Blood Pressure: 169/84 mmHg. Cardiovascular Pedal pulses are palpable bilaterally. General Notes: Wound exam; innumerable collection of small weeping areas on the anterior left leg look better. Most of these are scabbed over. He still has some superficial areas but they do not look too bad. ooHe has a small open area on the right which was not there last time. Integumentary (Hair, Skin) Wound #65 status is Open. Original cause of wound was Gradually Appeared. The wound is located on the Left,Circumferential Lower Leg. The wound measures 0.8cm length x 0.6cm width x 0.1cm depth; 0.377cm^2 area and 0.038cm^3 volume. There is Fat Layer (Subcutaneous Tissue) exposed. There is no tunneling or undermining noted. There is a small amount of serous drainage noted. The wound margin is flat and intact. There is large (67-100%) red granulation within the wound bed. There is no necrotic tissue within the wound bed. Wound #68 status is Open. Original cause of wound was Gradually Appeared. The wound is located on the Left,Proximal,Anterior Lower Leg. The wound measures 0.2cm length x 0.2cm width x 0.1cm depth; 0.031cm^2 area and 0.003cm^3 volume. There is Fat Layer (Subcutaneous Tissue) exposed. There is no tunneling or undermining noted. There is a small amount of serous drainage noted. The wound margin is flat and intact. There is large (67-100%) red granulation within the wound bed. There is no necrotic tissue within the wound bed. Wound #70 status is Open. Original cause of wound was Gradually Appeared. The wound is located on the Right,Posterior Lower Leg. The wound measures 1cm length x 0.6cm width x 0.1cm  depth; 0.471cm^2 area and 0.047cm^3 volume. There is Fat Layer (Subcutaneous Tissue) exposed. There is no tunneling or undermining noted. There is a small amount of serous drainage noted. The wound margin is flat and intact. There is large (67-100%) red granulation within the wound bed. There is no necrotic tissue within the wound bed. 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 There was a Four Layer Compression Therapy Procedure by [name], RN. Post procedure Diagnosis Wound #65: Same as Pre-Procedure Wound #68 Pre-procedure diagnosis of Wound #68 is a Lymphedema located on the Left,Proximal,Anterior Lower Leg . There was a Four Layer Compression Therapy Procedure by [name], RN. Post procedure Diagnosis Wound #68: Same as Pre-Procedure Wound #70 Pre-procedure diagnosis of Wound #70 is a Lymphedema located on the Right,Posterior Lower Leg . There was a Four Layer Compression Therapy Procedure by [name], RN. Post procedure Diagnosis Wound #70: Same as Pre-Procedure Plan Follow-up Appointments: Return Appointment in 2 weeks. Dressing Change Frequency: Other: - twice a week Skin Barriers/Peri-Wound Care: Moisturizing lotion Wound Cleansing: May shower with protection. Primary Wound Dressing: Wound #65 Left,Circumferential Lower Leg: Calcium Alginate with Silver Wound #68 Left,Proximal,Anterior Lower Leg: Calcium Alginate with Silver Wound #70 Right,Posterior Lower Leg: Calcium Alginate with Silver Secondary  Dressing: Wound #65 Left,Circumferential Lower Leg: ABD pad Wound #68 Left,Proximal,Anterior Lower Leg: ABD pad Edema Control: 4 layer compression - Bilateral  Elevate legs to the level of the heart or above for 30 minutes daily and/or when sitting, a frequency of: Support Garment 20-30 mm/Hg pressure to: - right lower leg , on in the AM off in the PM Segmental Compressive Device. - lymphedema pumps twice a day for 1 hour each time Home Health: Wound #65 Left,Circumferential Lower Leg: Continue Home Health skilled nursing for wound care. - [facility] he comes in today with a new wound on the right posterior calf but the scattering of small areas on the left anterior look better. 2. Still using silver alginate as the primary dressing 3. Still wrapping him bilaterally in 4 layers 4. He is using his external compression pumps twice a day according to him. If this is true this would represent a major improvement for this patient who has been longstanding noncompliant with compression pumps 5. I would like to discharge him in his own stocking on the least the right at least the next week Electronic Signature(s) Signed: 06/15/2020 5:19:28 PM By: Linton Ham MD Entered By: Linton Ham on 06/15/2020 13:57:42 -------------------------------------------------------------------------------- SuperBill Details Patient Name: Date of Service: [name] [name], Mena W. 06/15/2020 Medical Record Number: [id number] Patient Account Number: [id number] Date of Birth/Sex: Treating RN: 04-05-1956 (64 y.o. [name] [name] Primary Care Provider: [name] Other Clinician: Referring Provider: Treating Provider/Extender: [name] in Treatment: 29 Diagnosis Coding ICD-10 Codes Code Description [phone] Chronic venous hypertension (idiopathic) with ulcer and inflammation of bilateral lower extremity I89.0 Lymphedema, not elsewhere classified L97.821 Non-pressure chronic ulcer of  other part of left lower leg limited to breakdown of skin L97.811 Non-pressure chronic ulcer of other part of right lower leg limited to breakdown of skin L03.115 Cellulitis of right lower limb Facility Procedures Physician Procedures : CPT4 Code Description Modifier [id number] - WC PHYS LEVEL 3 - EST PT ICD-10 Diagnosis Description L97.821 Non-pressure chronic ulcer of other part of left lower leg limited to breakdown of skin L97.811 Non-pressure chronic ulcer of other part of  right lower leg limited to breakdown of skin I87.333 Chronic venous hypertension (idiopathic) with ulcer and inflammation of bilateral lower extremity I89.0 Lymphedema, not elsewhere classified Quantity: 1 Electronic Signature(s) Signed: 06/15/2020 5:19:28 PM By: Linton Ham MD Entered By: Linton Ham on 06/15/2020 13:58:09

## 2020-06-16 DIAGNOSIS — E1121 Type 2 diabetes mellitus with diabetic nephropathy: Secondary | ICD-10-CM | POA: Diagnosis not present

## 2020-06-16 DIAGNOSIS — I110 Hypertensive heart disease with heart failure: Secondary | ICD-10-CM | POA: Diagnosis not present

## 2020-06-16 DIAGNOSIS — I872 Venous insufficiency (chronic) (peripheral): Secondary | ICD-10-CM | POA: Diagnosis not present

## 2020-06-16 DIAGNOSIS — E7800 Pure hypercholesterolemia, unspecified: Secondary | ICD-10-CM | POA: Diagnosis not present

## 2020-06-16 DIAGNOSIS — Z9981 Dependence on supplemental oxygen: Secondary | ICD-10-CM | POA: Diagnosis not present

## 2020-06-16 DIAGNOSIS — Z6841 Body Mass Index (BMI) 40.0 and over, adult: Secondary | ICD-10-CM | POA: Diagnosis not present

## 2020-06-16 DIAGNOSIS — E669 Obesity, unspecified: Secondary | ICD-10-CM | POA: Diagnosis not present

## 2020-06-16 DIAGNOSIS — R296 Repeated falls: Secondary | ICD-10-CM | POA: Diagnosis not present

## 2020-06-16 DIAGNOSIS — L97821 Non-pressure chronic ulcer of other part of left lower leg limited to breakdown of skin: Secondary | ICD-10-CM | POA: Diagnosis not present

## 2020-06-16 DIAGNOSIS — I10 Essential (primary) hypertension: Secondary | ICD-10-CM | POA: Diagnosis not present

## 2020-06-16 DIAGNOSIS — I5032 Chronic diastolic (congestive) heart failure: Secondary | ICD-10-CM | POA: Diagnosis not present

## 2020-06-16 DIAGNOSIS — Z8673 Personal history of transient ischemic attack (TIA), and cerebral infarction without residual deficits: Secondary | ICD-10-CM | POA: Diagnosis not present

## 2020-06-16 DIAGNOSIS — G4733 Obstructive sleep apnea (adult) (pediatric): Secondary | ICD-10-CM | POA: Diagnosis not present

## 2020-06-16 DIAGNOSIS — E1129 Type 2 diabetes mellitus with other diabetic kidney complication: Secondary | ICD-10-CM | POA: Diagnosis not present

## 2020-06-16 DIAGNOSIS — Z23 Encounter for immunization: Secondary | ICD-10-CM | POA: Diagnosis not present

## 2020-06-16 NOTE — Progress Notes (Signed)
FREDICK SCHLOSSER ([id number]) , Visit Report for 06/15/2020 Arrival Information Details Patient Name: Date of Service: [name] 06/15/2020 12:30 PM Medical Record Number: [id number] Patient Account Number: [id number] Date of Birth/Sex: Treating RN: 02-Sep-1956 (64 y.o. [name]) [name] Primary Care Almee Pelphrey: [name] Other Clinician: Referring Hashem Goynes: Treating Makinzee Durley/Extender: [name] in Treatment: 29 Visit Information History Since Last Visit Added or deleted any medications: No Patient Arrived: [name]: No Arrival Time: 12:47 Had a fall or experienced change in No Accompanied By: self activities of daily living that may affect Transfer Assistance: None risk of falls: Patient Identification Verified: Yes Signs or symptoms of abuse/neglect since last visito No Secondary Verification Process Completed: Yes Hospitalized since last visit: No Patient Requires Transmission-Based Precautions: No Implantable device outside of the clinic excluding No Patient Has Alerts: Yes cellular tissue based products placed in the center Patient Alerts: R ABI = 1.25 since last visit: L ABI = 1.27 Has Dressing in Place as Prescribed: Yes Pain Present Now: No Electronic Signature(s) Signed: 06/16/2020 1:07:48 PM By: Sandre Kitty Entered By: Sandre Kitty on 06/15/2020 12:49:18 -------------------------------------------------------------------------------- Compression Therapy Details Patient Name: Date of Service: [name] 06/15/2020 12:30 PM Medical Record Number: [id number] Patient Account Number: [id number] Date of Birth/Sex: Treating RN: 10/04/1955 (64 y.o. [name] Primary Care Kristyn Obyrne: [name] Other Clinician: Referring Jermall Isaacson: Treating Lalani Winkles/Extender: [name] in Treatment: 29 Compression Therapy Performed for Wound Assessment: Wound #65  Left,Circumferential Lower Leg Performed By: Clinician [name], RN Compression Type: Four Layer Post Procedure Diagnosis Same as Pre-procedure Electronic Signature(s) Signed: 06/15/2020 5:34:55 PM By: [name] RN Entered By: [name] on 06/15/2020 13:38:35 -------------------------------------------------------------------------------- Compression Therapy Details Patient Name: Date of Service: [name] 06/15/2020 12:30 PM Medical Record Number: [id number] Patient Account Number: [id number] Date of Birth/Sex: Treating RN: [birth year] (64 y.o. [name] Primary Care Leyana Whidden: [name] Other Clinician: Referring Yuma Pacella: Treating Ameet Sandy/Extender: [name] in Treatment: 29 Compression Therapy Performed for Wound Assessment: Wound #68 Left,Proximal,Anterior Lower Leg Performed By: Clinician [name], RN Compression Type: Four Layer Post Procedure Diagnosis Same as Pre-procedure Electronic Signature(s) Signed: 06/15/2020 5:34:55 PM By: [name] RN Entered By: [name] on 06/15/2020 13:38:35 -------------------------------------------------------------------------------- Compression Therapy Details Patient Name: Date of Service: [name] 06/15/2020 12:30 PM Medical Record Number: [id number] Patient Account Number: [id number] Date of Birth/Sex: Treating RN: 02-22-1956 (64 y.o. [name] Primary Care Haji Delaine: [name] Other Clinician: Referring Ayrianna Mcginniss: Treating Sumaiyah Markert/Extender: [name] in Treatment: 29 Compression Therapy Performed for Wound Assessment: Wound #70 Right,Posterior Lower Leg Performed By: Clinician [name], RN Compression Type: Four Layer Post Procedure Diagnosis Same as Pre-procedure Electronic Signature(s) Signed: 06/15/2020 5:34:55 PM By: [name] RN Entered By: [name] on 06/15/2020  13:38:35 -------------------------------------------------------------------------------- Encounter Discharge Information Details Patient Name: Date of Service: [name], [name] 06/15/2020 12:30 PM Medical Record Number: [id number] Patient Account Number: [id number] Date of Birth/Sex: Treating RN: 12/10/1955 (64 y.o. [name] Primary Care Endre Coutts: [name] Other Clinician: Referring Summit Arroyave: Treating Korrina Zern/Extender: [name] in Treatment: 29 Encounter Discharge Information Items Discharge Condition: Stable Ambulatory Status: Cane Discharge Destination: Home Transportation: Private Auto Accompanied By: self Schedule Follow-up Appointment: Yes Clinical Summary of Care: Patient Declined Electronic Signature(s) Signed: 06/15/2020 5:04:51 PM By: Kela Millin Entered By: Kela Millin on 06/15/2020 14:55:12 --------------------------------------------------------------------------------  Lower Extremity Assessment Details Patient Name: Date of Service: [name]. 06/15/2020 12:30 PM Medical Record Number: [id number] Patient Account Number: [id number] Date of Birth/Sex: Treating RN: [birth year] (64 y.o. [name] Primary Care Naamah Boggess: [name] Other Clinician: Referring Shermaine Rivet: Treating Joannie Medine/Extender: [name] in Treatment: 29 Edema Assessment Assessed: [name]: No] [name]: No] Edema: [Left: Yes] [Right: Yes] Calf Left: Right: Point of Measurement: 30 cm From Medial Instep 41.5 cm 42 cm Ankle Left: Right: Point of Measurement: 13 cm From Medial Instep 25.5 cm 27 cm Vascular Assessment Pulses: Dorsalis Pedis Palpable: [Left:Yes] [Right:Yes] Electronic Signature(s) Signed: 06/15/2020 5:46:00 PM By: [name] RN, BSN Entered By: [name] on 06/15/2020 13:13:56 -------------------------------------------------------------------------------- Multi Wound  Chart Details Patient Name: Date of Service: [name], [place] W. 06/15/2020 12:30 PM Medical Record Number: [id number] Patient Account Number: [id number] Date of Birth/Sex: Treating RN: 1956-01-29 (64 y.o. [name]) [name] Primary Care Aslan Himes: [name] Other Clinician: Referring Cyndie Woodbeck: Treating Manpreet Strey/Extender: [name] in Treatment: 29 Vital Signs Height(in): 70 Pulse(bpm): 66 Weight(lbs): 360 Blood Pressure(mmHg): 169/84 Body Mass Index(BMI): 52 Temperature(F): 97.5 Respiratory Rate(breaths/min): 20 Photos: [65:No Photos Left, Circumferential Lower Leg] [68:No Photos Left, Proximal, Anterior Lower Leg] [70:No Photos Right, Posterior Lower Leg] Wound Location: [65:Gradually Appeared] [68:Gradually Appeared] [70:Gradually Appeared] Wounding Event: [65:Venous Leg Ulcer] [68:Lymphedema] [70:Lymphedema] Primary Etiology: [65:Lymphedema] [68:N/A] [70:N/A] Secondary Etiology: [65:Lymphedema, Sleep Apnea,] [68:Lymphedema, Sleep Apnea,] [70:Lymphedema, Sleep Apnea,] Comorbid History: [65:Congestive Heart Failure, Hypertension, Peripheral Venous Disease, Osteoarthritis 09/08/2019] [68:Congestive Heart Failure, Hypertension, Peripheral Venous Disease, Osteoarthritis 03/23/2020] [70:Congestive Heart Failure,  Hypertension, Peripheral Venous Disease, Osteoarthritis 06/15/2020] Date Acquired: [65:29] [68:12] [70:0] Weeks of Treatment: [65:Open] [68:Open] [70:Open] Wound Status: [65:Yes] [68:No] [70:No] Clustered Wound: [65:5] [68:N/A] [70:N/A] Clustered Quantity: [65:0.8x0.6x0.1] [68:0.2x0.2x0.1] [70:1x0.6x0.1] Measurements L x W x D (cm) [65:0.377] [68:0.031] [70:0.471] A (cm) : rea [65:0.038] [68:0.003] [70:0.047] Volume (cm) : [65:99.90%] [68:98.30%] [70:0.00%] % Reduction in Area: [65:99.90%] [68:98.30%] [70:0.00%] % Reduction in Volume: [65:Full Thickness Without Exposed] [68:Full Thickness Without Exposed] [70:Full Thickness Without  Exposed] Classification: [65:Support Structures Small] [68:Support Structures Small] [70:Support Structures Small] Exudate Amount: [65:Serous] [68:Serous] [70:Serous] Exudate Type: [65:amber] [68:amber] [70:amber] Exudate Color: [65:Flat and Intact] [68:Flat and Intact] [70:Flat and Intact] Wound Margin: [65:Large (67-100%)] [68:Large (67-100%)] [70:Large (67-100%)] Granulation Amount: [65:Red] [68:Red] [70:Red] Granulation Quality: [65:None Present (0%)] [68:None Present (0%)] [70:None Present (0%)] Necrotic Amount: [65:Fat Layer (Subcutaneous Tissue): Yes Fat Layer (Subcutaneous Tissue): Yes Fat Layer (Subcutaneous Tissue): Yes] Exposed Structures: [65:Fascia: No Tendon: No Muscle: No Joint: No Bone: No Large (67-100%)] [68:Fascia: No Tendon: No Muscle: No Joint: No Bone: No Large (67-100%)] [70:Fascia: No Tendon: No Muscle: No Joint: No Bone: No Small (1-33%)] Epithelialization: [65:Compression Therapy] [68:Compression Therapy] [70:Compression Therapy] Treatment Notes Electronic Signature(s) Signed: 06/15/2020 5:19:28 PM By: Linton Ham MD Signed: 06/15/2020 5:34:55 PM By: [name] RN Entered By: Linton Ham on 06/15/2020 13:53:38 -------------------------------------------------------------------------------- Multi-Disciplinary Care Plan Details Patient Name: Date of Service: [name], [name] 06/15/2020 12:30 PM Medical Record Number: [id number] Patient Account Number: [id number] Date of Birth/Sex: Treating RN: [birth year] (64 y.o. [name] Primary Care Placido Hangartner: [name] Other Clinician: Referring Mckenleigh Tarlton: Treating Satori Krabill/Extender: [name] in Treatment: 29 Active Inactive Wound/Skin Impairment Nursing Diagnoses: Knowledge deficit related to ulceration/compromised skin integrity Goals: Patient/caregiver will verbalize understanding of skin care regimen Date Initiated: 11/25/2019 Target Resolution Date: 06/23/2020 Goal Status:  Active Ulcer/skin breakdown will have a volume reduction of 30% by week 4 Date  Initiated: 11/25/2019 Date Inactivated: 01/06/2020 Target Resolution Date: 12/26/2019 Goal Status: Met Ulcer/skin breakdown will have a volume reduction of 50% by week 8 Date Initiated: 01/06/2020 Date Inactivated: 02/17/2020 Target Resolution Date: 02/05/2020 Goal Status: Met Interventions: Assess patient/caregiver ability to obtain necessary supplies Assess patient/caregiver ability to perform ulcer/skin care regimen upon admission and as needed Assess ulceration(s) every visit Notes: Electronic Signature(s) Signed: 06/15/2020 5:34:55 PM By: [name] RN Entered By: [name] on 06/15/2020 12:47:23 -------------------------------------------------------------------------------- Pain Assessment Details Patient Name: Date of Service: [name] 06/15/2020 12:30 PM Medical Record Number: [id number] Patient Account Number: [id number] Date of Birth/Sex: Treating RN: [birth year] (64 y.o. [name]) [name] Primary Care Kavi Almquist: [name] Other Clinician: Referring Jamileth Putzier: Treating Laakea Pereira/Extender: [name] in Treatment: 29 Active Problems Location of Pain Severity and Description of Pain Patient Has Paino No Site Locations Pain Management and Medication Current Pain Management: Electronic Signature(s) Signed: 06/15/2020 5:34:55 PM By: [name] RN Signed: 06/16/2020 1:07:48 PM By: Sandre Kitty Entered By: Sandre Kitty on 06/15/2020 12:50:44 -------------------------------------------------------------------------------- Patient/Caregiver Education Details Patient Name: Date of Service: [name] 9/28/2021andnbsp12:30 PM Medical Record Number: [id number] Patient Account Number: [id number] Date of Birth/Gender: Treating RN: 1956/01/09 (64 y.o. [name] Primary Care Physician: [name] Other Clinician: Referring Physician: Treating  Physician/Extender: [name] in Treatment: 29 Education Assessment Education Provided To: Patient Education Topics Provided Wound/Skin Impairment: Methods: Explain/Verbal Responses: State content correctly Electronic Signature(s) Signed: 06/15/2020 5:34:55 PM By: [name] RN Entered By: [name] on 06/15/2020 12:47:39 -------------------------------------------------------------------------------- Wound Assessment Details Patient Name: Date of Service: [name] 06/15/2020 12:30 PM Medical Record Number: [id number] Patient Account Number: [id number] Date of Birth/Sex: Treating RN: Jul 04, 1956 (64 y.o. [name] Primary Care Blaire Hodsdon: [name] Other Clinician: Referring Melquiades Kovar: Treating Emorie Mcfate/Extender: [name] in Treatment: 29 Wound Status Wound Number: 65 Primary Venous Leg Ulcer Etiology: Wound Location: Left, Circumferential Lower Leg Secondary Lymphedema Wounding Event: Gradually Appeared Etiology: Date Acquired: 09/08/2019 Wound Open Weeks Of Treatment: 29 Status: Clustered Wound: Yes Comorbid Lymphedema, Sleep Apnea, Congestive Heart Failure, History: Hypertension, Peripheral Venous Disease, Osteoarthritis Photos Photo Uploaded By: Mikeal Hawthorne on 06/16/2020 09:22:50 Wound Measurements Length: (cm) 0.8 Width: (cm) 0.6 Depth: (cm) 0.1 Clustered Quantity: 5 Area: (cm) 0.377 Volume: (cm) 0.038 % Reduction in Area: 99.9% % Reduction in Volume: 99.9% Epithelialization: Large (67-100%) Tunneling: No Undermining: No Wound Description Classification: Full Thickness Without Exposed Support Structures Wound Margin: Flat and Intact Exudate Amount: Small Exudate Type: Serous Exudate Color: amber Foul Odor After Cleansing: No Slough/Fibrino No Wound Bed Granulation Amount: Large (67-100%) Exposed Structure Granulation Quality: Red Fascia Exposed: No Necrotic Amount: None  Present (0%) Fat Layer (Subcutaneous Tissue) Exposed: Yes Tendon Exposed: No Muscle Exposed: No Joint Exposed: No Bone Exposed: No Treatment Notes Wound #65 (Left, Circumferential Lower Leg) 1. Cleanse With Wound Cleanser Soap and water 2. Periwound Care Moisturizing lotion 3. Primary Dressing Applied Calcium Alginate Ag 4. Secondary Dressing ABD Pad 6. Support Layer Applied 4 layer compression wrap Notes Environmental health practitioner) Signed: 06/15/2020 5:46:00 PM By: [name] RN, BSN Entered By: [name] on 06/15/2020 13:16:24 -------------------------------------------------------------------------------- Wound Assessment Details Patient Name: Date of Service: [name] 06/15/2020 12:30 PM Medical Record Number: [id number] Patient Account Number: [id number] Date of Birth/Sex: Treating RN: 04/29/1956 (64 y.o. [name] Primary Care Erice Ahles: [name] Other Clinician: Referring Tai Skelly: Treating Trameka Dorough/Extender: Andres Ege,  August Albino in Treatment: 29 Wound Status Wound Number: 68 Primary Lymphedema Etiology: Wound Location: Left, Proximal, Anterior Lower Leg Wound Open Wounding Event: Gradually Appeared Status: Date Acquired: 03/23/2020 Comorbid Lymphedema, Sleep Apnea, Congestive Heart Failure, Weeks Of Treatment: 12 History: Hypertension, Peripheral Venous Disease, Osteoarthritis Clustered Wound: No Photos Photo Uploaded By: Mikeal Hawthorne on 06/16/2020 09:22:50 Wound Measurements Length: (cm) 0.2 Width: (cm) 0.2 Depth: (cm) 0.1 Area: (cm) 0.031 Volume: (cm) 0.003 % Reduction in Area: 98.3% % Reduction in Volume: 98.3% Epithelialization: Large (67-100%) Tunneling: No Undermining: No Wound Description Classification: Full Thickness Without Exposed Support Struct Wound Margin: Flat and Intact Exudate Amount: Small Exudate Type: Serous Exudate Color: amber ures Wound Bed Granulation Amount:  Large (67-100%) Exposed Structure Granulation Quality: Red Fascia Exposed: No Necrotic Amount: None Present (0%) Fat Layer (Subcutaneous Tissue) Exposed: Yes Tendon Exposed: No Muscle Exposed: No Joint Exposed: No Bone Exposed: No Treatment Notes Wound #68 (Left, Proximal, Anterior Lower Leg) 1. Cleanse With Wound Cleanser Soap and water 2. Periwound Care Moisturizing lotion 3. Primary Dressing Applied Calcium Alginate Ag 4. Secondary Dressing ABD Pad 6. Support Layer Applied 4 layer compression wrap Notes Environmental health practitioner) Signed: 06/15/2020 5:46:00 PM By: [name] RN, BSN Entered By: [name] on 06/15/2020 13:16:40 -------------------------------------------------------------------------------- Wound Assessment Details Patient Name: Date of Service: [name], [name] 06/15/2020 12:30 PM Medical Record Number: [id number] Patient Account Number: [id number] Date of Birth/Sex: Treating RN: 1956/07/22 (64 y.o. [name] Primary Care Blaire Palomino: [name] Other Clinician: Referring Pearlene Teat: Treating Dom Haverland/Extender: [name] in Treatment: 29 Wound Status Wound Number: 70 Primary Lymphedema Etiology: Wound Location: Right, Posterior Lower Leg Wound Open Wounding Event: Gradually Appeared Status: Date Acquired: 06/15/2020 Comorbid Lymphedema, Sleep Apnea, Congestive Heart Failure, Weeks Of Treatment: 0 History: Hypertension, Peripheral Venous Disease, Osteoarthritis Clustered Wound: No Photos Photo Uploaded By: Mikeal Hawthorne on 06/16/2020 09:27:00 Wound Measurements Length: (cm) 1 Width: (cm) 0.6 Depth: (cm) 0.1 Area: (cm) 0.471 Volume: (cm) 0.047 % Reduction in Area: 0% % Reduction in Volume: 0% Epithelialization: Small (1-33%) Tunneling: No Undermining: No Wound Description Classification: Full Thickness Without Exposed Support Structures Wound Margin: Flat and Intact Exudate  Amount: Small Exudate Type: Serous Exudate Color: amber Foul Odor After Cleansing: No Slough/Fibrino No Wound Bed Granulation Amount: Large (67-100%) Exposed Structure Granulation Quality: Red Fascia Exposed: No Necrotic Amount: None Present (0%) Fat Layer (Subcutaneous Tissue) Exposed: Yes Tendon Exposed: No Muscle Exposed: No Joint Exposed: No Bone Exposed: No Treatment Notes Wound #70 (Right, Posterior Lower Leg) 1. Cleanse With Wound Cleanser Soap and water 2. Periwound Care Moisturizing lotion 3. Primary Dressing Applied Calcium Alginate Ag 4. Secondary Dressing ABD Pad 6. Support Layer Applied 4 layer compression wrap Notes Environmental health practitioner) Signed: 06/15/2020 5:46:00 PM By: [name] RN, BSN Entered By: [name] on 06/15/2020 13:15:02 -------------------------------------------------------------------------------- Vitals Details Patient Name: Date of Service: [name], [place] W. 06/15/2020 12:30 PM Medical Record Number: [id number] Patient Account Number: [id number] Date of Birth/Sex: Treating RN: [birth year] (64 y.o. [name]) [name] Primary Care Aubrionna Istre: [name] Other Clinician: Referring Shellsea Borunda: Treating Cyncere Sontag/Extender: [name] in Treatment: 29 Vital Signs Time Taken: 12:50 Temperature (F): 97.5 Height (in): 70 Pulse (bpm): 67 Weight (lbs): 360 Respiratory Rate (breaths/min): 20 Body Mass Index (BMI): 51.6 Blood Pressure (mmHg): 169/84 Reference Range: 80 - 120 mg / dl Electronic Signature(s) Signed: 06/16/2020 1:07:48 PM By: Sandre Kitty Entered By: Sandre Kitty on 06/15/2020 12:50:30

## 2020-06-17 DIAGNOSIS — I5033 Acute on chronic diastolic (congestive) heart failure: Secondary | ICD-10-CM | POA: Diagnosis not present

## 2020-06-17 DIAGNOSIS — I110 Hypertensive heart disease with heart failure: Secondary | ICD-10-CM | POA: Diagnosis not present

## 2020-06-17 DIAGNOSIS — N200 Calculus of kidney: Secondary | ICD-10-CM | POA: Diagnosis not present

## 2020-06-17 DIAGNOSIS — E1121 Type 2 diabetes mellitus with diabetic nephropathy: Secondary | ICD-10-CM | POA: Diagnosis not present

## 2020-06-22 DIAGNOSIS — I110 Hypertensive heart disease with heart failure: Secondary | ICD-10-CM | POA: Diagnosis not present

## 2020-06-22 DIAGNOSIS — L97821 Non-pressure chronic ulcer of other part of left lower leg limited to breakdown of skin: Secondary | ICD-10-CM | POA: Diagnosis not present

## 2020-06-22 DIAGNOSIS — I5032 Chronic diastolic (congestive) heart failure: Secondary | ICD-10-CM | POA: Diagnosis not present

## 2020-06-22 DIAGNOSIS — I872 Venous insufficiency (chronic) (peripheral): Secondary | ICD-10-CM | POA: Diagnosis not present

## 2020-06-22 DIAGNOSIS — E669 Obesity, unspecified: Secondary | ICD-10-CM | POA: Diagnosis not present

## 2020-06-22 DIAGNOSIS — G4733 Obstructive sleep apnea (adult) (pediatric): Secondary | ICD-10-CM | POA: Diagnosis not present

## 2020-06-29 ENCOUNTER — Encounter (HOSPITAL_BASED_OUTPATIENT_CLINIC_OR_DEPARTMENT_OTHER): Payer: Medicare Other | Admitting: Internal Medicine

## 2020-06-29 DIAGNOSIS — L97821 Non-pressure chronic ulcer of other part of left lower leg limited to breakdown of skin: Secondary | ICD-10-CM | POA: Diagnosis not present

## 2020-06-29 DIAGNOSIS — I5032 Chronic diastolic (congestive) heart failure: Secondary | ICD-10-CM | POA: Diagnosis not present

## 2020-06-29 DIAGNOSIS — E669 Obesity, unspecified: Secondary | ICD-10-CM | POA: Diagnosis not present

## 2020-06-29 DIAGNOSIS — I110 Hypertensive heart disease with heart failure: Secondary | ICD-10-CM | POA: Diagnosis not present

## 2020-06-29 DIAGNOSIS — G4733 Obstructive sleep apnea (adult) (pediatric): Secondary | ICD-10-CM | POA: Diagnosis not present

## 2020-06-29 DIAGNOSIS — I872 Venous insufficiency (chronic) (peripheral): Secondary | ICD-10-CM | POA: Diagnosis not present

## 2020-07-06 DIAGNOSIS — E669 Obesity, unspecified: Secondary | ICD-10-CM | POA: Diagnosis not present

## 2020-07-06 DIAGNOSIS — I5032 Chronic diastolic (congestive) heart failure: Secondary | ICD-10-CM | POA: Diagnosis not present

## 2020-07-06 DIAGNOSIS — I110 Hypertensive heart disease with heart failure: Secondary | ICD-10-CM | POA: Diagnosis not present

## 2020-07-06 DIAGNOSIS — I872 Venous insufficiency (chronic) (peripheral): Secondary | ICD-10-CM | POA: Diagnosis not present

## 2020-07-06 DIAGNOSIS — G4733 Obstructive sleep apnea (adult) (pediatric): Secondary | ICD-10-CM | POA: Diagnosis not present

## 2020-07-06 DIAGNOSIS — L97821 Non-pressure chronic ulcer of other part of left lower leg limited to breakdown of skin: Secondary | ICD-10-CM | POA: Diagnosis not present

## 2020-07-13 ENCOUNTER — Encounter (HOSPITAL_BASED_OUTPATIENT_CLINIC_OR_DEPARTMENT_OTHER): Payer: Medicare Other | Attending: Internal Medicine | Admitting: Internal Medicine

## 2020-07-13 ENCOUNTER — Other Ambulatory Visit: Payer: Self-pay

## 2020-07-13 DIAGNOSIS — Z9119 Patient's noncompliance with other medical treatment and regimen: Secondary | ICD-10-CM | POA: Diagnosis not present

## 2020-07-13 DIAGNOSIS — I509 Heart failure, unspecified: Secondary | ICD-10-CM | POA: Insufficient documentation

## 2020-07-13 DIAGNOSIS — I87333 Chronic venous hypertension (idiopathic) with ulcer and inflammation of bilateral lower extremity: Secondary | ICD-10-CM | POA: Insufficient documentation

## 2020-07-13 DIAGNOSIS — I110 Hypertensive heart disease with heart failure: Secondary | ICD-10-CM | POA: Insufficient documentation

## 2020-07-13 DIAGNOSIS — L03115 Cellulitis of right lower limb: Secondary | ICD-10-CM | POA: Diagnosis not present

## 2020-07-13 DIAGNOSIS — Z8616 Personal history of COVID-19: Secondary | ICD-10-CM | POA: Diagnosis not present

## 2020-07-13 DIAGNOSIS — L97822 Non-pressure chronic ulcer of other part of left lower leg with fat layer exposed: Secondary | ICD-10-CM | POA: Diagnosis not present

## 2020-07-13 DIAGNOSIS — L97821 Non-pressure chronic ulcer of other part of left lower leg limited to breakdown of skin: Secondary | ICD-10-CM | POA: Diagnosis not present

## 2020-07-13 DIAGNOSIS — G4733 Obstructive sleep apnea (adult) (pediatric): Secondary | ICD-10-CM | POA: Insufficient documentation

## 2020-07-13 DIAGNOSIS — I890 Lymphedema, not elsewhere classified: Secondary | ICD-10-CM | POA: Insufficient documentation

## 2020-07-13 DIAGNOSIS — Z8673 Personal history of transient ischemic attack (TIA), and cerebral infarction without residual deficits: Secondary | ICD-10-CM | POA: Diagnosis not present

## 2020-07-13 DIAGNOSIS — L97811 Non-pressure chronic ulcer of other part of right lower leg limited to breakdown of skin: Secondary | ICD-10-CM | POA: Diagnosis not present

## 2020-07-13 DIAGNOSIS — I872 Venous insufficiency (chronic) (peripheral): Secondary | ICD-10-CM | POA: Diagnosis not present

## 2020-07-14 DIAGNOSIS — L97821 Non-pressure chronic ulcer of other part of left lower leg limited to breakdown of skin: Secondary | ICD-10-CM | POA: Diagnosis not present

## 2020-07-14 DIAGNOSIS — I110 Hypertensive heart disease with heart failure: Secondary | ICD-10-CM | POA: Diagnosis not present

## 2020-07-14 DIAGNOSIS — I872 Venous insufficiency (chronic) (peripheral): Secondary | ICD-10-CM | POA: Diagnosis not present

## 2020-07-14 DIAGNOSIS — E669 Obesity, unspecified: Secondary | ICD-10-CM | POA: Diagnosis not present

## 2020-07-14 DIAGNOSIS — G4733 Obstructive sleep apnea (adult) (pediatric): Secondary | ICD-10-CM | POA: Diagnosis not present

## 2020-07-14 DIAGNOSIS — I5032 Chronic diastolic (congestive) heart failure: Secondary | ICD-10-CM | POA: Diagnosis not present

## 2020-07-15 NOTE — Progress Notes (Signed)
[name] [name] ([id number]) , Visit Report for 07/13/2020 Arrival Information Details Patient Name: Date of Service: [name] [name] RDIN W. 07/13/2020 [name]:30 PM Medical Record Number: [id number] Patient Account Number: [id number] Date of Birth/Sex: Treating RN: [name] [name]-[name]-[birth year] (64 y.o. [name] [name] Primary Care Militza Devery: [name] Other Clinician: Referring Facundo Allemand: Treating Yoshio Seliga/Extender: [name] in Treatment: 33 Visit Information History Since Last Visit Added or deleted any medications: No Patient Arrived: [name] [name] Any new allergies or adverse reactions: No Arrival Time: [name]:50 Had a fall or experienced change in No Accompanied By: alone activities of daily living that [name] affect Transfer Assistance: None risk of falls: Patient Identification Verified: Yes Signs or symptoms of abuse/neglect since last visito No Secondary Verification Process Completed: Yes Hospitalized since last visit: No Patient Requires Transmission-Based Precautions: No Implantable device outside of the clinic excluding No Patient Has Alerts: Yes cellular tissue based products placed in the center Patient Alerts: R ABI = 1.25 since last visit: L ABI = 1.27 Has Dressing in Place as Prescribed: Yes Has Compression in Place as Prescribed: Yes Pain Present Now: No Electronic Signature(s) Signed: 07/13/2020 5:15:49 PM By: Levan Hurst RN, BSN Entered By: Levan Hurst on 07/13/2020 [name]:51:16 -------------------------------------------------------------------------------- Compression Therapy Details Patient Name: Date of Service: [name] [name], Lamonte Sakai RDIN W. 07/13/2020 [name]:30 PM Medical Record Number: [id number] Patient Account Number: [id number] Date of Birth/Sex: Treating RN: [birth year][name] [name] (64 y.o. [name] [name] Primary Care Marnie Fazzino: [name] Other Clinician: Referring Sylvan Lahm: Treating Mionna Advincula/Extender: [name] in Treatment: 33 Compression  Therapy Performed for Wound Assessment: Wound #65 Left,Distal,Anterior Lower Leg Performed By: Clinician Deon Pilling, RN Compression Type: Four Layer Post Procedure Diagnosis Same as Pre-procedure Electronic Signature(s) Signed: 10/[name]/2021 5:24:43 PM By: Deon Pilling Entered By: Deon Pilling on 07/13/2020 13:09:42 -------------------------------------------------------------------------------- Compression Therapy Details Patient Name: Date of Service: [name] [name] RDIN W. 07/13/2020 [name]:30 PM Medical Record Number: [id number] Patient Account Number: [id number] Date of Birth/Sex: Treating RN: [name] [name], [name] [name] (64 y.o. [name] [name] Primary Care Doralene Glanz: [name] Other Clinician: Referring Damonique Brunelle: Treating Bora Bost/Extender: [name] in Treatment: 33 Compression Therapy Performed for Wound Assessment: Wound #71 Left,Proximal,Medial Lower Leg Performed By: Clinician Deon Pilling, RN Compression Type: Four Layer Post Procedure Diagnosis Same as Pre-procedure Electronic Signature(s) Signed: 10/[name]/2021 5:24:43 PM By: Deon Pilling Entered By: Deon Pilling on 07/13/2020 13:09:42 -------------------------------------------------------------------------------- Compression Therapy Details Patient Name: Date of Service: [name] [name] RDIN W. 07/13/2020 [name]:30 PM Medical Record Number: [id number] Patient Account Number: [id number] Date of Birth/Sex: Treating RN: [name] [name], [name] [name] (64 y.o. [name] [name] Primary Care Sherlene Rickel: [name] Other Clinician: Referring Cully Luckow: Treating Erick Oxendine/Extender: [name] in Treatment: 33 Compression Therapy Performed for Wound Assessment: Wound #68 Left,Proximal,Anterior Lower Leg Performed By: Clinician Deon Pilling, RN Compression Type: Four Layer Post Procedure Diagnosis Same as Pre-procedure Electronic Signature(s) Signed: 10/[name]/2021 5:24:43 PM By: Deon Pilling Entered By: Deon Pilling on 07/13/2020 13:09:42 -------------------------------------------------------------------------------- Encounter Discharge Information Details Patient Name: Date of Service: [name] [name], [name] 07/13/2020 [name]:30 PM Medical Record Number: [id number] Patient Account Number: [id number] Date of Birth/Sex: Treating RN: [name] [name][birth year] (64 y.o. [name] [name] Primary Care Shafin Pollio: [name] Other Clinician: Referring Raysha Tilmon: Treating Magdalina Whitehead/Extender: [name] in Treatment: 33 Encounter Discharge Information Items Discharge Condition: Stable Ambulatory Status: Cane Discharge Destination: Home Transportation: Private Auto Accompanied By: alone Schedule Follow-up Appointment: Yes Clinical Summary of Care: Patient Declined Electronic Signature(s) Signed: 07/13/2020 5:15:49 PM By: Levan Hurst RN,  BSN Entered By: Levan Hurst on 07/13/2020 14:48:59 -------------------------------------------------------------------------------- Lower Extremity Assessment Details Patient Name: Date of Service: [name] [name] RDIN W. 07/13/2020 [name]:30 PM Medical Record Number: [id number] Patient Account Number: [id number] Date of Birth/Sex: Treating RN: [name] [name], [name] [name] (64 y.o. [name] [name] Primary Care Delphina Schum: [name] Other Clinician: Referring Kawthar Ennen: Treating Fredrich Cory/Extender: [name] in Treatment: 33 Edema Assessment Assessed: [name]: Yes] [name]: Yes] Edema: [Left: Yes] [Right: Yes] Calf Left: Right: Point of Measurement: 30 cm From Medial Instep 39 cm 42 cm Ankle Left: Right: Point of Measurement: 13 cm From Medial Instep 25 cm 27 cm Vascular Assessment Pulses: Dorsalis Pedis Palpable: [Left:Yes] [Right:Yes] Electronic Signature(s) Signed: 07/13/2020 5:15:49 PM By: Levan Hurst RN, BSN Entered By: Levan Hurst on 07/13/2020  13:04:45 -------------------------------------------------------------------------------- Multi Wound Chart Details Patient Name: Date of Service: [name] [name], [name] 07/13/2020 [name]:30 PM Medical Record Number: [id number] Patient Account Number: [id number] Date of Birth/Sex: Treating RN: [name] [name] (64 y.o. Lorette Ang, Meta.Reding Primary Care Benedicto Capozzi: [name] Other Clinician: Referring Carolos Fecher: Treating Defne Gerling/Extender: [name] in Treatment: 33 Vital Signs Height(in): 70 Pulse(bpm): 5 Weight(lbs): 360 Blood Pressure(mmHg): 180/100 Body Mass Index(BMI): 52 Temperature(F): 98.7 Respiratory Rate(breaths/min): 20 Photos: [65:No Photos Left, Distal, Anterior Lower Leg] [68:No Photos Left, Proximal, Anterior Lower Leg] [70:No Photos Right, Posterior Lower Leg] Wound Location: [65:Gradually Appeared] [68:Gradually Appeared] [70:Gradually Appeared] Wounding Event: [65:Venous Leg Ulcer] [68:Lymphedema] [70:Lymphedema] Primary Etiology: [65:Lymphedema] [68:N/A] [70:N/A] Secondary Etiology: [65:Lymphedema, Sleep Apnea,] [68:Lymphedema, Sleep Apnea,] [70:N/A] Comorbid History: [65:Congestive Heart Failure, Hypertension, Peripheral Venous Disease, Osteoarthritis [name]/21/2020] [68:Congestive Heart Failure, Hypertension, Peripheral Venous Disease, Osteoarthritis 03/23/2020] [70:9/[name]/2021] Date Acquired: [65:33] [68:16] [70:4] Weeks of Treatment: [65:Open] [68:Open] [70:Healed - Epithelialized] Wound Status: [65:Yes] [68:No] [70:No] Clustered Wound: [65:1] [68:N/A] [70:N/A] Clustered Quantity: [65:0.5x1x0.1] [68:1.7x10x0.1] [70:0x0x0] Measurements L x W x D (cm) [65:0.393] [68:13.352] [70:0] A (cm) : rea [65:0.039] [68:1.335] [70:0] Volume (cm) : [65:99.90%] [68:-636.10%] [70:100.00%] % Reduction in Area: [65:99.90%] [68:-637.60%] [70:100.00%] % Reduction in Volume: [65:Full Thickness Without Exposed] [68:Full Thickness Without Exposed] [70:Full Thickness Without  Exposed] Classification: [65:Support Structures Medium] [68:Support Structures Medium] [70:Support Structures N/A] Exudate Amount: [65:Serous] [68:Serous] [70:N/A] Exudate Type: [65:amber] [68:amber] [70:N/A] Exudate Color: [65:Flat and Intact] [68:Flat and Intact] [70:N/A] Wound Margin: [65:Large (67-100%)] [68:Large (67-100%)] [70:N/A] Granulation Amount: [65:Red] [68:Red] [70:N/A] Granulation Quality: [65:None Present (0%)] [68:None Present (0%)] [70:N/A] Necrotic Amount: [65:Fat Layer (Subcutaneous Tissue): Yes Fat Layer (Subcutaneous Tissue): Yes N/A] Exposed Structures: [65:Fascia: No Tendon: No Muscle: No Joint: No Bone: No Large (67-100%)] [68:Fascia: No Tendon: No Muscle: No Joint: No Bone: No Small (1-33%)] [70:N/A] Epithelialization: [65:Compression Therapy] [68:Compression Therapy] [70:N/A] Wound Number: 71 N/A N/A Photos: No Photos N/A N/A Left, Proximal, Medial Lower Leg N/A N/A Wound Location: Gradually Appeared N/A N/A Wounding Event: Lymphedema N/A N/A Primary Etiology: N/A N/A N/A Secondary Etiology: Lymphedema, Sleep Apnea, N/A N/A Comorbid History: Congestive Heart Failure, Hypertension, Peripheral Venous Disease, Osteoarthritis 07/13/2020 N/A N/A Date Acquired: 0 N/A N/A Weeks of Treatment: Open N/A N/A Wound Status: No N/A N/A Clustered Wound: N/A N/A N/A Clustered Quantity: 2x4.5x0.1 N/A N/A Measurements L x W x D (cm) 7.069 N/A N/A A (cm) : rea 0.707 N/A N/A Volume (cm) : N/A N/A N/A % Reduction in Area: N/A N/A N/A % Reduction in Volume: Full Thickness Without Exposed N/A N/A Classification: Support Structures Medium N/A N/A Exudate Amount: Serosanguineous N/A N/A Exudate Type: red, brown N/A N/A Exudate Color: Distinct, outline attached N/A N/A Wound Margin: Large (67-100%) N/A N/A  Granulation Amount: Red, Pink N/A N/A Granulation Quality: Small (1-33%) N/A N/A Necrotic Amount: Fat Layer (Subcutaneous Tissue): Yes N/A  N/A Exposed Structures: Fascia: No Tendon: No Muscle: No Joint: No Bone: No Small (1-33%) N/A N/A Epithelialization: Compression Therapy N/A N/A Procedures Performed: Treatment Notes Electronic Signature(s) Signed: 07/13/2020 4:47:47 PM By: Linton Ham MD Signed: 10/[name]/2021 5:24:43 PM By: Deon Pilling Signed: 10/[name]/2021 5:24:43 PM By: Deon Pilling Entered By: Linton Ham on 07/13/2020 13:10:19 -------------------------------------------------------------------------------- [place] Details Patient Name: Date of Service: [name] [name], [name] 07/13/2020 [name]:30 PM Medical Record Number: [id number] Patient Account Number: [id number] Date of Birth/Sex: Treating RN: [name] [name] (64 y.o. [name] [name] Primary Care Joelle Roswell: [name] Other Clinician: Referring Faithe Ariola: Treating Silvia Markuson/Extender: [name] in Treatment: 33 Active Inactive Wound/Skin Impairment Nursing Diagnoses: Knowledge deficit related to ulceration/compromised skin integrity Goals: Patient/caregiver will verbalize understanding of skin care regimen Date Initiated: 11/25/2019 Target Resolution Date: [name]/11/2019 Goal Status: Active Ulcer/skin breakdown will have a volume reduction of 30% by week 4 Date Initiated: 11/25/2019 Date Inactivated: 01/06/2020 Target Resolution Date: 12/26/2019 Goal Status: Met Ulcer/skin breakdown will have a volume reduction of 50% by week 8 Date Initiated: 01/06/2020 Date Inactivated: 02/17/2020 Target Resolution Date: 02/05/2020 Goal Status: Met Interventions: Assess patient/caregiver ability to obtain necessary supplies Assess patient/caregiver ability to perform ulcer/skin care regimen upon admission and as needed Assess ulceration(s) every visit Notes: Electronic Signature(s) Signed: 10/[name]/2021 5:24:43 PM By: Deon Pilling Entered By: Deon Pilling on 07/13/2020  13:09:07 -------------------------------------------------------------------------------- Pain Assessment Details Patient Name: Date of Service: [name] [name] RDIN W. 07/13/2020 [name]:30 PM Medical Record Number: [id number] Patient Account Number: [id number] Date of Birth/Sex: Treating RN: [name] [name]/[name]/[birth year] (64 y.o. [name] [name] Primary Care Mekenna Finau: [name] Other Clinician: Referring Avanthika Dehnert: Treating Ra Pfiester/Extender: [name] in Treatment: 33 Active Problems Location of Pain Severity and Description of Pain Patient Has Paino No Site Locations Pain Management and Medication Current Pain Management: Electronic Signature(s) Signed: 07/13/2020 5:15:49 PM By: Levan Hurst RN, BSN Entered By: Levan Hurst on 07/13/2020 [name]:53:25 -------------------------------------------------------------------------------- Patient/Caregiver Education Details Patient Name: Date of Service: [name] 10/26/2021andnbsp12:30 PM Medical Record Number: [id number] Patient Account Number: [id number] Date of Birth/Gender: Treating RN: [name] [name], [name] [name] (64 y.o. [name] [name] Primary Care Physician: [name] Other Clinician: Referring Physician: Treating Physician/Extender: [name] in Treatment: 39 Education Assessment Education Provided To: Patient Education Topics Provided Wound/Skin Impairment: Handouts: Skin Care Do's and Dont's Methods: Explain/Verbal Responses: Reinforcements needed Electronic Signature(s) Signed: 10/[name]/2021 5:24:43 PM By: Deon Pilling Entered By: Deon Pilling on 07/13/2020 13:09:21 -------------------------------------------------------------------------------- Wound Assessment Details Patient Name: Date of Service: [name] [name] RDIN W. 07/13/2020 [name]:30 PM Medical Record Number: [id number] Patient Account Number: [id number] Date of Birth/Sex: Treating RN: [name] [name] (64 y.o. [name] [name] Primary  Care Kimiya Brunelle: [name] Other Clinician: Referring Lindsay Soulliere: Treating Jaccob Czaplicki/Extender: [name] in Treatment: 33 Wound Status Wound Number: 65 Primary Venous Leg Ulcer Etiology: Wound Location: Left, Distal, Anterior Lower Leg Secondary Lymphedema Wounding Event: Gradually Appeared Etiology: Date Acquired: [name]/21/2020 Wound Open Weeks Of Treatment: 33 Status: Clustered Wound: Yes Comorbid Lymphedema, Sleep Apnea, Congestive Heart Failure, History: Hypertension, Peripheral Venous Disease, Osteoarthritis Photos Photo Uploaded By: Mikeal Hawthorne on 07/14/2020 13:58:35 Wound Measurements Length: (cm) 0. Width: (cm) 1 Depth: (cm) 0. Clustered Quantity: 1 Area: (cm) 0 Volume: (cm) 0 5 % Reduction in Area: 99.9% % Reduction in Volume: 99.9% 1 Epithelialization: Large (67-100%) Tunneling: No .393  Undermining: No .039 Wound Description Classification: Full Thickness Without Exposed Support Structures Wound Margin: Flat and Intact Exudate Amount: Medium Exudate Type: Serous Exudate Color: amber Foul Odor After Cleansing: No Slough/Fibrino No Wound Bed Granulation Amount: Large (67-100%) Exposed Structure Granulation Quality: Red Fascia Exposed: No Necrotic Amount: None Present (0%) Fat Layer (Subcutaneous Tissue) Exposed: Yes Tendon Exposed: No Muscle Exposed: No Joint Exposed: No Bone Exposed: No Treatment Notes Wound #65 (Left, Distal, Anterior Lower Leg) 1. Cleanse With Soap and water 2. Periwound Care Moisturizing lotion 3. Primary Dressing Applied Calcium Alginate Ag 4. Secondary Dressing ABD Pad Dry Gauze 6. Support Layer Applied 4 layer compression Water quality scientist) Signed: 07/13/2020 5:15:49 PM By: Levan Hurst RN, BSN Entered By: Levan Hurst on 07/13/2020 13:03:49 -------------------------------------------------------------------------------- Wound Assessment Details Patient Name: Date of  Service: [name] [name], [name] 07/13/2020 [name]:30 PM Medical Record Number: [id number] Patient Account Number: [id number] Date of Birth/Sex: Treating RN: [name] [name] (64 y.o. [name] [name] Primary Care Maha Fischel: [name] Other Clinician: Referring Alicya Bena: Treating Rand Etchison/Extender: [name] in Treatment: 33 Wound Status Wound Number: 68 Primary Lymphedema Etiology: Wound Location: Left, Proximal, Anterior Lower Leg Wound Open Wounding Event: Gradually Appeared Status: Date Acquired: 03/23/2020 Comorbid Lymphedema, Sleep Apnea, Congestive Heart Failure, Weeks Of Treatment: 16 History: Hypertension, Peripheral Venous Disease, Osteoarthritis Clustered Wound: No Photos Photo Uploaded By: Mikeal Hawthorne on 07/14/2020 13:58:51 Wound Measurements Length: (cm) 1.7 Width: (cm) 10 Depth: (cm) 0.1 Area: (cm) 13.352 Volume: (cm) 1.335 % Reduction in Area: -636.1% % Reduction in Volume: -637.6% Epithelialization: Small (1-33%) Tunneling: No Undermining: No Wound Description Classification: Full Thickness Without Exposed Support Structu Wound Margin: Flat and Intact Exudate Amount: Medium Exudate Type: Serous Exudate Color: amber res Foul Odor After Cleansing: No Slough/Fibrino Yes Wound Bed Granulation Amount: Large (67-100%) Exposed Structure Granulation Quality: Red Fascia Exposed: No Necrotic Amount: None Present (0%) Fat Layer (Subcutaneous Tissue) Exposed: Yes Tendon Exposed: No Muscle Exposed: No Joint Exposed: No Bone Exposed: No Treatment Notes Wound #68 (Left, Proximal, Anterior Lower Leg) 1. Cleanse With Soap and water 2. Periwound Care Moisturizing lotion 3. Primary Dressing Applied Calcium Alginate Ag 4. Secondary Dressing ABD Pad Dry Gauze 6. Support Layer Applied 4 layer compression Water quality scientist) Signed: 07/13/2020 5:15:49 PM By: Levan Hurst RN, BSN Entered By: Levan Hurst on 07/13/2020  13:04:19 -------------------------------------------------------------------------------- Wound Assessment Details Patient Name: Date of Service: [name] [name], [name] 07/13/2020 [name]:30 PM Medical Record Number: [id number] Patient Account Number: [id number] Date of Birth/Sex: Treating RN: [name] [name]16/[name] [name] (64 y.o. [name] [name] Primary Care Ashyia Schraeder: [name] Other Clinician: Referring Oral Hallgren: Treating Lamonte Hartt/Extender: [name] in Treatment: 33 Wound Status Wound Number: 70 Primary Etiology: Lymphedema Wound Location: Right, Posterior Lower Leg Wound Status: Healed - Epithelialized Wounding Event: Gradually Appeared Date Acquired: 9/[name]/2021 Weeks Of Treatment: 4 Clustered Wound: No Photos Photo Uploaded By: Mikeal Hawthorne on 07/14/2020 13:58:37 Wound Measurements Length: (cm) Width: (cm) Depth: (cm) Area: (cm) Volume: (cm) 0 % Reduction in Area: 100% 0 % Reduction in Volume: 100% 0 0 0 Wound Description Classification: Full Thickness Without Exposed Support Structur es Electronic Signature(s) Signed: 07/13/2020 5:15:49 PM By: Levan Hurst RN, BSN Entered By: Levan Hurst on 07/13/2020 13:03:[name] -------------------------------------------------------------------------------- Wound Assessment Details Patient Name: Date of Service: [name] [name] RDIN W. 07/13/2020 [name]:30 PM Medical Record Number: [id number] Patient Account Number: [id number] Date of Birth/Sex: Treating RN: 25-[name]-[name] [name] (64 y.o. [name] [name] Primary Care Leisha Trinkle: [name] Other Clinician: Referring Tonjua Rossetti:  Treating Ambera Fedele/Extender: [name] in Treatment: 33 Wound Status Wound Number: 71 Primary Lymphedema Etiology: Wound Location: Left, Proximal, Medial Lower Leg Wound Open Wounding Event: Gradually Appeared Status: Date Acquired: 07/13/2020 Comorbid Lymphedema, Sleep Apnea, Congestive Heart Failure, Weeks Of Treatment:  0 History: Hypertension, Peripheral Venous Disease, Osteoarthritis Clustered Wound: No Photos Photo Uploaded By: Mikeal Hawthorne on 07/14/2020 13:58:[name] Wound Measurements Length: (cm) 2 Width: (cm) 4.5 Depth: (cm) 0.1 Area: (cm) 7.069 Volume: (cm) 0.707 % Reduction in Area: % Reduction in Volume: Epithelialization: Small (1-33%) Tunneling: No Undermining: No Wound Description Classification: Full Thickness Without Exposed Support Structu Wound Margin: Distinct, outline attached Exudate Amount: Medium Exudate Type: Serosanguineous Exudate Color: red, brown res Foul Odor After Cleansing: No Slough/Fibrino Yes Wound Bed Granulation Amount: Large (67-100%) Exposed Structure Granulation Quality: Red, Pink Fascia Exposed: No Necrotic Amount: Small (1-33%) Fat Layer (Subcutaneous Tissue) Exposed: Yes Necrotic Quality: Adherent Slough Tendon Exposed: No Muscle Exposed: No Joint Exposed: No Bone Exposed: No Treatment Notes Wound #71 (Left, Proximal, Medial Lower Leg) 1. Cleanse With Soap and water 2. Periwound Care Moisturizing lotion 3. Primary Dressing Applied Calcium Alginate Ag 4. Secondary Dressing ABD Pad Dry Gauze 6. Support Layer Applied 4 layer compression Water quality scientist) Signed: 07/13/2020 5:15:49 PM By: Levan Hurst RN, BSN Entered By: Levan Hurst on 07/13/2020 13:[name]:51 -------------------------------------------------------------------------------- [place] Details Patient Name: Date of Service: [name] [name], [name] 07/13/2020 [name]:30 PM Medical Record Number: [id number] Patient Account Number: [id number] Date of Birth/Sex: Treating RN: 06/[name]/56 (64 y.o. [name] [name] Primary Care Bentley Haralson: [name] Other Clinician: Referring Fredia Chittenden: Treating Maebell Lyvers/Extender: [name] in Treatment: 33 Vital Signs Time Taken: [name]:52 Temperature (F): 98.7 Height (in): 70 Pulse (bpm): 61 Weight (lbs):  360 Respiratory Rate (breaths/min): 20 Body Mass Index (BMI): 51.6 Blood Pressure (mmHg): 180/100 Reference Range: 80 - 120 mg / dl Notes MD notified of elevated BP Electronic Signature(s) Signed: 07/13/2020 5:15:49 PM By: Levan Hurst RN, BSN Entered By: Levan Hurst on 07/13/2020 [name]:53:20

## 2020-07-15 NOTE — Progress Notes (Signed)
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 think at this time he had some wounds on the left but none on the right he was therefore put in his extremitease stocking on the right. Sometime after this he had a wound develop on the right medial calf and they have been wrapping him ever since. 2/6; is a patient with severe chronic venous insufficiency and secondary lymphedema. He has  compression pumps and does not use them. He has much improved wounds on the bilateral lower legs. He arrives for monthly follow-up. 3/13; monthly follow-up. Patient's legs look much the same bilateral scattering of small wounds but with tremendous leaking lymphedema. His edema control is not too bad but I certainly do not think this is going to heal. He will not use compression pumps. Silver alginate is the primary dressing 4/14; monthly follow-up. Patient is largely deteriorated he has a smattering of multiple open small areas on the left lateral calf with areas of denuded full- thickness skin. On the right he is not as bad some surface eschar and debris small areas. We have been using silver alginate under 4-layer compression. Miraculously he still has home health changing these dressings i.e. [facility] 5/12; monthly follow-up. Much better condition of the edema in his bilateral lower legs. He has home health using 4 layer compression and he states he uses his compression pumps every second day 6/12; monthly  follow-up. He has decent edema control. He only has a small superficial area on the right leg a large number of small wounds on the left leg. He is not using his compression pumps. He has home health changing his dressings READMISSION 06/13/2019 Mr. schicker is now a 64 year old man. He has a long history of chronic venous insufficiency with chronic stasis dermatitis and lymphedema. He was last in this clinic in June at that point he had a small superficial area on the right leg and the larger number of small wounds on the left. He has been using silver alginate and and 4-layer compression. He still has [facility] home health care coming out. He tells me he went to [facility] wound care twice. They healed him out after that he does not think he was actually healed. His wife at the time was in [place] after falling and fracturing her femur by the sound of it. She is currently in a nursing home in  [place] He comes into clinic today with large areas of superficial denuded epithelium which is almost circumferential on the right and a large area on the left lateral. His edema control is marginal. He is not in any pain. Past medical history includes congestive heart failure, previous venous ablation, lymphedema and obstructive sleep apnea. He has compression pumps at home but he has been completely noncompliant with this by his own admission. He is not a diabetic. ABIs in our clinic were 1.27 on the right and 1.25 on the left we do not have time for this this afternoon I am and I am not comfortable 10/9; the patient arrives with green drainage under the compression right greater than left. He is not complaining of any pain. He also almost circumferential epithelial loss on the right. He has home health changing the dressing. We are doing 2-week follow-ups. He lives in [place] 10/23; 2-week follow-up. The patient took his antibiotics he seems to have tolerated this well. He has still areas on the right and left calf left more substantially. I think he has some improvement in the epithelialization. He has new wounds on the left dorsal foot today. He says he has been using compression pumps 11/6; two-week follow-up. The patient arrived still with wounds mostly on his lateral lower legs. He has a new area on the right dorsal foot today just in close proximity to his toes. His edema control is marginal. He will not use his compression pumps. He has home health changing the dressings. He tells [place] that his wife is in the hospital in [place] with heart failure. I suspect he is sitting at her bedside for most the day. His legs are probably dependent. 12/4; 1 month follow-up. He arrives with everything on his legs completely closed. He has some form of external compression garment at home as well as compression pumps. READMISSION 11/25/2019 We discharge this patient on 08/22/2019 with everything on his  bilateral lower legs closed. He had an external compression stocking for both legs at home as well as compression pumps although admittedly he has never been compliant with the latter. He states his legs stay closed for about 2 or 3 weeks. He ended up in hospital at [place] from 12/22 through 12/29 with Covid infection. He came home and is gradually been regaining his strength. Currently has bilateral innumerable wounds almost circumferentially on both lower legs in the setting of severe venous inflammation but no current infection. The patient has diastolic heart failure hypertension history of TIA chronic  venous ulcers had obstructive sleep apnea although he is not compliant with CPAP. He was never felt to have an arterial issue in this clinic his pedal pulses and ABIs have been normal most recently in September/20 at 1.25 on the right and 1.27 on the left 3/23; he arrives with much less edema in both legs. He has home health changing the dressings. Been using silver alginate. He only has an open area along the wrap line of both legs. The rest of this seems to be closed. 4/6; better edema control in our compression wraps. He has not been using his external compression pumps he tells me because his wife was hospitalized for about 10 days. We have been using silver alginate on the wounds. 4/20; he has good edema control and compression wraps. His wife is back at [facility] he says he has not been using the external compression pumps. Silver alginate to the wounds 5/4; he has good edema control bilaterally. He has not been using the compression pumps he tells [place] his wife is coming home from [place] today. He does not have any open wounds on the right leg continued large collection of small wounds on the left anterior leg extending medially into laterally nothing much posteriorly on the left. 6/1. He has a smattering of small wounds anteriorly on the left and a larger but superficial wound on the left  posterior calf. There is nothing open on the right we have been using silver alginate on the left I will change that the [facility] today 6/22; there is nothing open on his right leg. He has a smattering of small eschars on the left anterior lower leg but no open wounds per se. Somebody applied the compression wrap on the right leg in a very irregular fashion. He has a very tender area on the right medial ankle. 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 Not surprisingly comes back in with wounds on his bilateral legs at the level of where his external compression garment was tight superiorly. He tells me he is using his pumps once every second day. 7/20; 2-week follow-up the patient has not been using his compression pumps his wife was transferred to a new nursing home. He has 1 wound on the medial left lower leg and a small area on the right lateral 8/17; patient arrives today with only a smattering of small wounds on the left anterior lower leg most of these are eschared and look benign. There is nothing on his right leg. We have been using silver alginate under 4-layer compression 8/31; patient's wounds on the left anterior lower leg are larger. There is still nothing open on the right toe although we did put compression wraps on this last time. He has home health. Says he is used his compression pumps twice in the last 3 days. Obviously not sufficient 9/14; 2-week follow-up. We discharged him in his own zippered stocking. Apparently home health helped him change this and noted weeping edema fluid therefore put him back in a wrap he arrives in the clinic today with no open wounds on the right innumerable small broken areas on the anterior left calf. Uncontrolled edema above the level of the wrap compatible with lymphedema 9/27 2-week follow-up. He arrives today with the left anterior lower extremity looking better. On the  right he has a small open area posteriorly. I am going to put him back in compression on both  sides. He claims compliance with his compression pumps at home twice a day he has home health changing his dressing 10/26; 1 month follow-up he has home health changing his dressings there is no open wound on the right leg he has extremitease stockings and external compression pumps which he says he is using once a day. The left leg is always been a more difficult site and although it is difficult to identify any precise open wound he has several leaking areas especially anteriorly and superiorly and at the edge of his wraps Objective Constitutional Patient is hypertensive.. Pulse regular and within target range for patient.Marland Kitchen Respirations regular, non-labored and within target range.. Temperature is normal and within the target range for the patient.Marland Kitchen Appears in no distress. Vitals Time Taken: 12:52 PM, Height: 70 in, Weight: 360 lbs, BMI: 51.6, Temperature: 98.7 F, Pulse: 61 bpm, Respiratory Rate: 20 breaths/min, Blood Pressure: 180/100 mmHg. General Notes: MD notified of elevated BP Cardiovascular Pedal pulses arepedal pulses palpable. pedal pulses palpable. General Notes: Wound exam to anterior left leg has still has some weeping areas although nothing is obviously open. There is no open wounds on his right leg. We have really good edema control bilaterally Integumentary (Hair, Skin) Wound #65 status is Open. Original cause of wound was Gradually Appeared. The wound is located on the [facility] Lower Leg. The wound measures 0.5cm length x 1cm width x 0.1cm depth; 0.393cm^2 area and 0.039cm^3 volume. There is Fat Layer (Subcutaneous Tissue) exposed. There is no tunneling or undermining noted. There is a medium amount of serous drainage noted. The wound margin is flat and intact. There is large (67-100%) red granulation within the wound bed. There is no necrotic tissue within the wound  bed. Wound #68 status is Open. Original cause of wound was Gradually Appeared. The wound is located on the Left,Proximal,Anterior Lower Leg. The wound measures 1.7cm length x 10cm width x 0.1cm depth; 13.352cm^2 area and 1.335cm^3 volume. There is Fat Layer (Subcutaneous Tissue) exposed. There is no tunneling or undermining noted. There is a medium amount of serous drainage noted. The wound margin is flat and intact. There is large (67-100%) red granulation within the wound bed. There is no necrotic tissue within the wound bed. Wound #70 status is Healed - Epithelialized. Original cause of wound was Gradually Appeared. The wound is located on the Right,Posterior Lower Leg. The wound measures 0cm length x 0cm width x 0cm depth; 0cm^2 area and 0cm^3 volume. Wound #71 status is Open. Original cause of wound was Gradually Appeared. The wound is located on the Left,Proximal,Medial Lower Leg. The wound measures 2cm length x 4.5cm width x 0.1cm depth; 7.069cm^2 area and 0.707cm^3 volume. There is Fat Layer (Subcutaneous Tissue) exposed. There is no tunneling or undermining noted. There is a medium amount of serosanguineous drainage noted. The wound margin is distinct with the outline attached to the wound base. There is large (67-100%) red, pink granulation within the wound bed. There is a small (1-33%) amount of necrotic tissue within the wound bed including Adherent Slough. 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 There was a  Four Layer  Compression Therapy Procedure by Deon Pilling, RN. Post procedure Diagnosis Wound #65: Same as Pre-Procedure Wound #68 Pre-procedure diagnosis of Wound #68 is a Lymphedema located on the Left,Proximal,Anterior Lower Leg . There was a Four Layer Compression Therapy Procedure by Deon Pilling, RN. Post procedure Diagnosis Wound #68: Same as Pre-Procedure Wound #71 Pre-procedure diagnosis of Wound #71 is a Lymphedema located on the Left,Proximal,Medial Lower Leg . There was a Four Layer Compression Therapy Procedure by Deon Pilling, RN. Post procedure Diagnosis Wound #71: Same as Pre-Procedure Plan Follow-up Appointments: Return Appointment in 2 weeks. Dressing Change Frequency: Other: - twice a week Skin Barriers/Peri-Wound Care: Moisturizing lotion Wound Cleansing: May shower with protection. Primary Wound Dressing: Wound #65 Left,Distal,Anterior Lower Leg: Calcium Alginate with Silver Wound #68 Left,Proximal,Anterior Lower Leg: Calcium Alginate with Silver Wound #71 Left,Proximal,Medial Lower Leg: Calcium Alginate with Silver Secondary Dressing: Dry Gauze ABD pad Edema Control: 4 layer compression - Bilateral Elevate legs to the level of the heart or above for 30 minutes daily and/or when sitting, a frequency of: Support Garment 30-40 mm/Hg pressure to: - right lower leg , on in the AM off in the PM Segmental Compressive Device. - lymphedema pumps twice a day for 1 hour each time Home Health: Wound #65 Left,Distal,Anterior Lower Leg: Continue Home Health skilled nursing for wound care. - [facility]. Continue with the usual dressing which is silver alginate 2 his own compressin stockings on the right 3 He claims to be using his juxtlite once per day. I've asked for twice Electronic Signature(s) Signed: 07/13/2020 4:47:47 PM By: Linton Ham MD Entered By: Linton Ham on 07/13/2020  13:15:02 -------------------------------------------------------------------------------- SuperBill Details Patient Name: Date of Service: [name], [name] 07/13/2020 Medical Record Number: [id number] Patient Account Number: [id number] Date of Birth/Sex: Treating RN: 03/07/1956 (64 y.o. Hessie Diener Primary Care Provider: [name] Other Clinician: Referring Provider: Treating Provider/Extender: [name] in Treatment: 33 Diagnosis Coding ICD-10 Codes Code Description [id number] Chronic venous hypertension (idiopathic) with ulcer and inflammation of bilateral lower extremity I89.0 Lymphedema, not elsewhere classified L97.821 Non-pressure chronic ulcer of other part of left lower leg limited to breakdown of skin L97.811 Non-pressure chronic ulcer of other part of right lower leg limited to breakdown of skin L03.115 Cellulitis of right lower limb Facility Procedures Physician Procedures : CPT4 Code Description Modifier [id number] - WC PHYS LEVEL 3 - EST PT ICD-10 Diagnosis Description I87.333 Chronic venous hypertension (idiopathic) with ulcer and inflammation of bilateral lower extremity I89.0 Lymphedema, not elsewhere classified  L97.821 Non-pressure chronic ulcer of other part of left lower leg limited to breakdown of skin Quantity: 1 Electronic Signature(s) Signed: 07/13/2020 4:47:47 PM By: Linton Ham MD Entered By: Linton Ham on 07/13/2020 13:15:31

## 2020-07-16 DIAGNOSIS — Z8673 Personal history of transient ischemic attack (TIA), and cerebral infarction without residual deficits: Secondary | ICD-10-CM | POA: Diagnosis not present

## 2020-07-16 DIAGNOSIS — I87333 Chronic venous hypertension (idiopathic) with ulcer and inflammation of bilateral lower extremity: Secondary | ICD-10-CM | POA: Diagnosis not present

## 2020-07-16 DIAGNOSIS — E669 Obesity, unspecified: Secondary | ICD-10-CM | POA: Diagnosis not present

## 2020-07-16 DIAGNOSIS — L97821 Non-pressure chronic ulcer of other part of left lower leg limited to breakdown of skin: Secondary | ICD-10-CM | POA: Diagnosis not present

## 2020-07-16 DIAGNOSIS — Z9981 Dependence on supplemental oxygen: Secondary | ICD-10-CM | POA: Diagnosis not present

## 2020-07-16 DIAGNOSIS — I5032 Chronic diastolic (congestive) heart failure: Secondary | ICD-10-CM | POA: Diagnosis not present

## 2020-07-16 DIAGNOSIS — G4733 Obstructive sleep apnea (adult) (pediatric): Secondary | ICD-10-CM | POA: Diagnosis not present

## 2020-07-16 DIAGNOSIS — I110 Hypertensive heart disease with heart failure: Secondary | ICD-10-CM | POA: Diagnosis not present

## 2020-07-20 DIAGNOSIS — L97821 Non-pressure chronic ulcer of other part of left lower leg limited to breakdown of skin: Secondary | ICD-10-CM | POA: Diagnosis not present

## 2020-07-20 DIAGNOSIS — I5032 Chronic diastolic (congestive) heart failure: Secondary | ICD-10-CM | POA: Diagnosis not present

## 2020-07-20 DIAGNOSIS — I110 Hypertensive heart disease with heart failure: Secondary | ICD-10-CM | POA: Diagnosis not present

## 2020-07-20 DIAGNOSIS — I87333 Chronic venous hypertension (idiopathic) with ulcer and inflammation of bilateral lower extremity: Secondary | ICD-10-CM | POA: Diagnosis not present

## 2020-07-20 DIAGNOSIS — G4733 Obstructive sleep apnea (adult) (pediatric): Secondary | ICD-10-CM | POA: Diagnosis not present

## 2020-07-20 DIAGNOSIS — E669 Obesity, unspecified: Secondary | ICD-10-CM | POA: Diagnosis not present

## 2020-07-23 DIAGNOSIS — I10 Essential (primary) hypertension: Secondary | ICD-10-CM | POA: Diagnosis not present

## 2020-07-23 DIAGNOSIS — E7800 Pure hypercholesterolemia, unspecified: Secondary | ICD-10-CM | POA: Diagnosis not present

## 2020-07-23 DIAGNOSIS — Z1159 Encounter for screening for other viral diseases: Secondary | ICD-10-CM | POA: Diagnosis not present

## 2020-07-23 DIAGNOSIS — E1121 Type 2 diabetes mellitus with diabetic nephropathy: Secondary | ICD-10-CM | POA: Diagnosis not present

## 2020-07-27 ENCOUNTER — Other Ambulatory Visit: Payer: Self-pay

## 2020-07-27 ENCOUNTER — Encounter (HOSPITAL_BASED_OUTPATIENT_CLINIC_OR_DEPARTMENT_OTHER): Payer: Medicare Other | Attending: Internal Medicine | Admitting: Internal Medicine

## 2020-07-27 DIAGNOSIS — I87333 Chronic venous hypertension (idiopathic) with ulcer and inflammation of bilateral lower extremity: Secondary | ICD-10-CM | POA: Diagnosis not present

## 2020-07-27 DIAGNOSIS — L03115 Cellulitis of right lower limb: Secondary | ICD-10-CM | POA: Insufficient documentation

## 2020-07-27 DIAGNOSIS — Z6841 Body Mass Index (BMI) 40.0 and over, adult: Secondary | ICD-10-CM | POA: Diagnosis not present

## 2020-07-27 DIAGNOSIS — E1129 Type 2 diabetes mellitus with other diabetic kidney complication: Secondary | ICD-10-CM | POA: Diagnosis not present

## 2020-07-27 DIAGNOSIS — E7800 Pure hypercholesterolemia, unspecified: Secondary | ICD-10-CM | POA: Diagnosis not present

## 2020-07-27 DIAGNOSIS — I890 Lymphedema, not elsewhere classified: Secondary | ICD-10-CM | POA: Insufficient documentation

## 2020-07-27 DIAGNOSIS — E11622 Type 2 diabetes mellitus with other skin ulcer: Secondary | ICD-10-CM | POA: Diagnosis not present

## 2020-07-27 DIAGNOSIS — L97811 Non-pressure chronic ulcer of other part of right lower leg limited to breakdown of skin: Secondary | ICD-10-CM | POA: Diagnosis not present

## 2020-07-27 DIAGNOSIS — L97821 Non-pressure chronic ulcer of other part of left lower leg limited to breakdown of skin: Secondary | ICD-10-CM | POA: Insufficient documentation

## 2020-07-27 DIAGNOSIS — Z8673 Personal history of transient ischemic attack (TIA), and cerebral infarction without residual deficits: Secondary | ICD-10-CM | POA: Diagnosis not present

## 2020-07-27 DIAGNOSIS — G4733 Obstructive sleep apnea (adult) (pediatric): Secondary | ICD-10-CM | POA: Diagnosis not present

## 2020-07-27 DIAGNOSIS — I10 Essential (primary) hypertension: Secondary | ICD-10-CM | POA: Diagnosis not present

## 2020-07-27 DIAGNOSIS — I872 Venous insufficiency (chronic) (peripheral): Secondary | ICD-10-CM | POA: Diagnosis not present

## 2020-07-27 DIAGNOSIS — E1121 Type 2 diabetes mellitus with diabetic nephropathy: Secondary | ICD-10-CM | POA: Diagnosis not present

## 2020-07-28 NOTE — Progress Notes (Signed)
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 12/4. I think at this time he had some wounds on the left but none on the right he was therefore put in his extremitease stocking on the right. Sometime after this he had a wound develop on the right medial calf and they have been wrapping him ever since. 2/6; is a patient with severe chronic venous insufficiency and secondary lymphedema. He has compression pumps and does not use  them. He has much improved wounds on the bilateral lower legs. He arrives for monthly follow-up. 3/13; monthly follow-up. Patient's legs look much the same bilateral scattering of small wounds but with tremendous leaking lymphedema. His edema control is not too bad but I certainly do not think this is going to heal. He will not use compression pumps. Silver alginate is the primary dressing 4/14; monthly follow-up. Patient is largely deteriorated he has a smattering of multiple open small areas on the left lateral calf with areas of denuded full- thickness skin. On the right he is not as bad some surface eschar and debris small areas. We have been using silver alginate under 4-layer compression. Miraculously he still has home health changing these dressings i.e. [facility] 5/12; monthly follow-up. Much better condition of the edema in his bilateral lower legs. He has home  health using 4 layer compression and he states he uses his compression pumps every second day 6/12; monthly follow-up. He has decent edema control. He only has a small superficial area on the right leg a large number of small wounds on the left leg. He is not using his compression pumps. He has home health changing his dressings READMISSION 06/13/2019 Mr. veal is now a 64 year old man. He has a long history of chronic venous insufficiency with chronic stasis dermatitis and lymphedema. He was last in this clinic in June at that point he had a small superficial area on the right leg and the larger number of small wounds on the left. He has been using silver alginate and and 4-layer compression. He still has [facility] home health care coming out. He tells me he went to [facility] wound care twice. They healed him out after that he does not think he was actually healed. His wife at the time was in [place] after falling and fracturing her femur by the sound of it. She is currently in a nursing home in [place] He comes into clinic today with  large areas of superficial denuded epithelium which is almost circumferential on the right and a large area on the left lateral. His edema control is marginal. He is not in any pain. Past medical history includes congestive heart failure, previous venous ablation, lymphedema and obstructive sleep apnea. He has compression pumps at home but he has been completely noncompliant with this by his own admission. He is not a diabetic. ABIs in our clinic were 1.27 on the right and 1.25 on the left we do not have time for this this afternoon I am and I am not comfortable 10/9; the patient arrives with green drainage under the compression right greater than left. He is not complaining of any pain. He also almost circumferential epithelial loss on the right. He has home health changing the dressing. We are doing 2-week follow-ups. He lives in [place] 10/23; 2-week follow-up. The patient took his antibiotics he seems to have tolerated this well. He has still areas on the right and left calf left more substantially. I think he has some improvement in the epithelialization. He has new wounds on the left dorsal foot today. He says he has been using compression pumps 11/6; two-week follow-up. The patient arrived still with wounds mostly on his lateral lower legs. He has a new area on the right dorsal foot today just in close proximity to his toes. His edema control is marginal. He will not use his compression pumps. He has home health changing the dressings. He tells [place] that his wife is in the hospital in [place] with heart failure. I suspect he is sitting at her bedside for most the day. His legs are probably dependent. 12/4; 1 month follow-up. He arrives with everything on his legs completely closed. He has some form of external compression garment at home as well as compression pumps. READMISSION 11/25/2019 We discharge this patient on 08/22/2019 with everything on his bilateral lower legs closed. He had an  external compression stocking for both legs at home as well as compression pumps although admittedly he has never been compliant with the latter. He states his legs stay closed for about 2 or 3 weeks. He ended up in hospital at [place] from 12/22 through 12/29 with Covid infection. He came home and is gradually been regaining his strength. Currently has bilateral innumerable wounds almost circumferentially on both lower legs in the setting of  severe venous inflammation but no current infection. The patient has diastolic heart failure hypertension history of TIA chronic venous ulcers had obstructive sleep apnea although he is not compliant with CPAP. He was never felt to have an arterial issue in this clinic his pedal pulses and ABIs have been normal most recently in September/20 at 1.25 on the right and 1.27 on the left 3/23; he arrives with much less edema in both legs. He has home health changing the dressings. Been using silver alginate. He only has an open area along the wrap line of both legs. The rest of this seems to be closed. 4/6; better edema control in our compression wraps. He has not been using his external compression pumps he tells me because his wife was hospitalized for about 10 days. We have been using silver alginate on the wounds. 4/20; he has good edema control and compression wraps. His wife is back at [facility] he says he has not been using the external compression pumps. Silver alginate to the wounds 5/4; he has good edema control bilaterally. He has not been using the compression pumps he tells [place] his wife is coming home from [place] today. He does not have any open wounds on the right leg continued large collection of small wounds on the left anterior leg extending medially into laterally nothing much posteriorly on the left. 6/1. He has a smattering of small wounds anteriorly on the left and a larger but superficial wound on the left posterior calf. There is nothing open  on the right we have been using silver alginate on the left I will change that the [facility] today 6/22; there is nothing open on his right leg. He has a smattering of small eschars on the left anterior lower leg but no open wounds per se. Somebody applied the compression wrap on the right leg in a very irregular fashion. He has a very tender area on the right medial ankle. 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 Not surprisingly comes back in with wounds on his bilateral legs at the level of where his external compression garment was tight superiorly. He tells me he is using his pumps once every second day. 7/20; 2-week follow-up the patient has not been using his compression pumps his wife was transferred to a new nursing home. He has 1 wound on the medial left lower leg and a small area on the right lateral 8/17; patient arrives today with only a smattering of small wounds on the left anterior lower leg most of these are eschared and look benign. There is nothing on his right leg. We have been using silver alginate under 4-layer compression 8/31; patient's wounds on the left anterior lower leg are larger. There is still nothing open on the right toe although we did put compression wraps on this last time. He has home health. Says he is used his compression pumps twice in the last 3 days. Obviously not sufficient 9/14; 2-week follow-up. We discharged him in his own zippered stocking. Apparently home health helped him change this and noted weeping edema fluid therefore put him back in a wrap he arrives in the clinic today with no open wounds on the right innumerable small broken areas on the anterior left calf. Uncontrolled edema above the level of the wrap compatible with lymphedema 9/27 2-week follow-up. He arrives today with the left anterior lower extremity looking better. On the right he  has a small open area  posteriorly. I am going to put him back in compression on both sides. He claims compliance with his compression pumps at home twice a day he has home health changing his dressing 10/26; 1 month follow-up he has home health changing his dressings there is no open wound on the right leg he has extremitease stockings and external compression pumps which he says he is using once a day. The left leg is always been a more difficult site and although it is difficult to identify any precise open wound he has several leaking areas especially anteriorly and superiorly and at the edge of his wraps 11/9; 2-week follow-up. He has his extremities stocking on the right. He says he has been using his compression pumps once a day. He has 1 remaining wound on the left anterior mid tibia which looks healthy his edema control is otherwise good on the left Objective Constitutional Vitals Time Taken: 12:50 PM, Height: 70 in, Weight: 360 lbs, BMI: 51.6, Temperature: 97.9 F, Pulse: 72 bpm, Respiratory Rate: 22 breaths/min, Blood Pressure: 178/89 mmHg. Respiratory work of breathing is normal. Cardiovascular Pulses are difficult to feel on the left but his foot is warm. General Notes: Wound exam; left anterior leg. 1 open area on the left anterior mid tibia. No evidence of surrounding infection. He has significant edema in his foot this is not new Integumentary (Hair, Skin) Wound #65 status is Open. Original cause of wound was Gradually Appeared. The wound is located on the [facility] Lower Leg. The wound measures 1cm length x 0.5cm width x 0.1cm depth; 0.393cm^2 area and 0.039cm^3 volume. There is Fat Layer (Subcutaneous Tissue) exposed. There is no tunneling or undermining noted. There is a medium amount of serosanguineous drainage noted. The wound margin is flat and intact. There is large (67-100%) red granulation within the wound bed. There is no necrotic tissue within the wound bed. Wound #68 status is  Open. Original cause of wound was Gradually Appeared. The wound is located on the Left,Proximal,Anterior Lower Leg. The wound measures 2.2cm length x 1cm width x 0.1cm depth; 1.728cm^2 area and 0.173cm^3 volume. There is Fat Layer (Subcutaneous Tissue) exposed. There is no tunneling or undermining noted. There is a medium amount of serosanguineous drainage noted. The wound margin is flat and intact. There is large (67-100%) red granulation within the wound bed. There is no necrotic tissue within the wound bed. Wound #71 status is Healed - Epithelialized. Original cause of wound was Gradually Appeared. The wound is located on the Left,Proximal,Medial Lower Leg. The wound measures 0cm length x 0cm width x 0cm depth; 0cm^2 area and 0cm^3 volume. There is no tunneling or undermining noted. There is a none present amount of drainage noted. The wound margin is distinct with the outline attached to the wound base. There is no granulation within the wound bed. There is no necrotic tissue within the wound bed. 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 There was a Four Layer Compression Therapy Procedure by [name], RN. Post procedure Diagnosis Wound #65: Same as Pre-Procedure Wound #68 Pre-procedure diagnosis of  Wound #68 is a Lymphedema located on the Left,Proximal,Anterior Lower Leg . There was a Four Layer Compression Therapy Procedure by [name], RN. Post procedure Diagnosis Wound #68: Same as Pre-Procedure Plan Follow-up  Appointments: Return Appointment in 2 weeks. Dressing Change Frequency: Other: - twice a week Skin Barriers/Peri-Wound Care: Moisturizing lotion TCA Cream or Ointment Wound Cleansing: May shower with protection. Primary Wound Dressing: Wound #65 Left,Distal,Anterior Lower Leg: Calcium Alginate with Silver Wound #68 Left,Proximal,Anterior Lower Leg: Calcium Alginate with Silver Secondary Dressing: Dry Gauze ABD pad Edema Control: 4 layer compression - Bilateral Elevate legs to the level of the heart or above for 30 minutes daily and/or when sitting, a frequency of: Support Garment 30-40 mm/Hg pressure to: - right lower leg , on in the AM off in the PM Segmental Compressive Device. - lymphedema pumps twice a day for 1 hour each time Home Health: Wound #65 Left,Distal,Anterior Lower Leg: Continue Home Health skilled nursing for wound care. - [facility] 1. TCA and moisturizer 2 still silver alginate under 4-layer compression on the left 3. He has got his stocking on the right leg this seems to be maintaining his skin integrity 4. He is using his compression pumps once a day I have asked him for twice repetitively but I think he is being compliant with the once a day issue which is for him an improvement Electronic Signature(s) Signed: 07/28/2020 9:36:19 AM By: Linton Ham MD Entered By: Linton Ham on 07/27/2020 13:19:06 -------------------------------------------------------------------------------- SuperBill Details Patient Name: Date of Service: [name], Pike W. 07/27/2020 Medical Record Number: [id number] Patient Account Number: [id number] Date of Birth/Sex: Treating RN: 1955-09-22 (64 y.o. [name] Primary Care Provider: [name] Other Clinician: Referring Provider: Treating Provider/Extender: [name] in Treatment: 35 Diagnosis Coding ICD-10 Codes Code Description [phone] Chronic venous hypertension (idiopathic) with ulcer  and inflammation of bilateral lower extremity I89.0 Lymphedema, not elsewhere classified L97.821 Non-pressure chronic ulcer of other part of left lower leg limited to breakdown of skin L97.811 Non-pressure chronic ulcer of other part of right lower leg limited to breakdown of skin L03.115 Cellulitis of right lower limb Facility Procedures CPT4 Code: [id number] Description: (Facility Use Only) [id number] - APPLY [place] RT LEG Modifier: Quantity: 1 Physician Procedures Electronic Signature(s) Signed: 07/28/2020 9:36:19 AM By: Linton Ham MD Entered By: Linton Ham on 07/27/2020 13:19:31

## 2020-07-29 NOTE — Progress Notes (Signed)
[name] [name] ([id number]) , Visit Report for 07/27/2020 Arrival Information Details Patient Name: Date of Service: [name] Ro RDIN W. 07/27/2020 12:30 PM Medical Record Number: [id number] Patient Account Number: [id number] Date of Birth/Sex: Treating RN: [birth year][name] [name] (64 y.o. [name] Primary Care Perlie Stene: [name] Other Clinician: Referring Jaimon Bugaj: Treating Zonnie Landen/Extender: [name] in Treatment: 57 Visit Information History Since Last Visit Added or deleted any medications: No Patient Arrived: [name]: No Arrival Time: 12:50 Had a fall or experienced change in No Accompanied By: alone activities of daily living that may affect Transfer Assistance: None risk of falls: Patient Identification Verified: Yes Signs or symptoms of abuse/neglect since last visito No Secondary Verification Process Completed: Yes Hospitalized since last visit: No Patient Requires Transmission-Based Precautions: No Implantable device outside of the clinic excluding No Patient Has Alerts: Yes cellular tissue based products placed in the center Patient Alerts: R ABI = 1.25 since last visit: L ABI = 1.27 Has Dressing in Place as Prescribed: Yes Has Compression in Place as Prescribed: Yes Pain Present Now: No Electronic Signature(s) Signed: 07/29/2020 8:28:44 AM By: Levan Hurst RN, BSN Entered By: Levan Hurst on 07/27/2020 12:50:45 -------------------------------------------------------------------------------- Compression Therapy Details Patient Name: Date of Service: [name] [name], [place] W. 07/27/2020 12:30 PM Medical Record Number: [id number] Patient Account Number: [id number] Date of Birth/Sex: Treating RN: [name] [name] (64 y.o. [name] Primary Care Violett Hobbs: [name] Other Clinician: Referring Elena Davia: Treating Florentina Marquart/Extender: [name] in Treatment: 35 Compression Therapy  Performed for Wound Assessment: Wound #65 Left,Distal,Anterior Lower Leg Performed By: Clinician [name], RN Compression Type: Four Layer Post Procedure Diagnosis Same as Pre-procedure Electronic Signature(s) Signed: 07/28/2020 5:51:14 PM By: [name] RN Entered By: [name] on 07/27/2020 13:13:25 -------------------------------------------------------------------------------- Compression Therapy Details Patient Name: Date of Service: [name] Ro RDIN W. 07/27/2020 12:30 PM Medical Record Number: [id number] Patient Account Number: [id number] Date of Birth/Sex: Treating RN: [name] [name] (64 y.o. [name] Primary Care Cele Mote: [name] Other Clinician: Referring Elisha Mcgruder: Treating Elainna Eshleman/Extender: [name] in Treatment: 35 Compression Therapy Performed for Wound Assessment: Wound #68 Left,Proximal,Anterior Lower Leg Performed By: Clinician [name], RN Compression Type: Four Layer Post Procedure Diagnosis Same as Pre-procedure Electronic Signature(s) Signed: 07/28/2020 5:51:14 PM By: [name] RN Entered By: [name] on 07/27/2020 13:13:25 -------------------------------------------------------------------------------- Lower Extremity Assessment Details Patient Name: Date of Service: [name] Ro RDIN W. 07/27/2020 12:30 PM Medical Record Number: [id number] Patient Account Number: [id number] Date of Birth/Sex: Treating RN: [name] [name] y.o. [name] Primary Care Wilbert Hayashi: [name] Other Clinician: Referring Akeya Ryther: Treating Gail Creekmore/Extender: [name] in Treatment: 35 Edema Assessment Assessed: [name] [name]: No] [name] [name]: No] Edema: [Left: Yes] [Right: Yes] Calf Left: Right: Point of Measurement: 30 cm From Medial Instep 39 cm 42 cm Ankle Left: Right: Point of Measurement: 13 cm From Medial Instep 25 cm 27 cm Electronic Signature(s) Signed: 07/29/2020 8:28:44 AM By: Levan Hurst RN, BSN Entered By: Levan Hurst on 07/27/2020 12:54:26 -------------------------------------------------------------------------------- Multi Wound Chart Details Patient Name: Date of Service: [name] [name], [name] W. 07/27/2020 12:30 PM Medical Record Number: [id number] Patient Account Number: [id number] Date of Birth/Sex: Treating RN: [name] [name][birth year] (64 y.o. [name] Primary Care Zendayah Hardgrave: [name] Other Clinician: Referring Amazin Pincock: Treating Albi Rappaport/Extender: [name] in Treatment: 35 Vital Signs Height(in): 70 Pulse(bpm): 72 Weight(lbs): 360 Blood Pressure(mmHg): 178/89 Body Mass Index(BMI): 52 Temperature(F): 97.9 Respiratory Rate(breaths/min):  37 Photos: [65:No Photos Left, Distal, Anterior Lower Leg] [68:No Photos Left, Proximal, Anterior Lower Leg] [71:No Photos Left, Proximal, Medial Lower Leg] Wound Location: [65:Gradually Appeared] [68:Gradually Appeared] [71:Gradually Appeared] Wounding Event: [65:Venous Leg Ulcer] [68:Lymphedema] [71:Lymphedema] Primary Etiology: [65:Lymphedema] [68:N/A] [71:N/A] Secondary Etiology: [65:Lymphedema, Sleep Apnea,] [68:Lymphedema, Sleep Apnea,] [71:Lymphedema, Sleep Apnea,] Comorbid History: [65:Congestive Heart Failure, Hypertension, Peripheral Venous Disease, Osteoarthritis 09/08/2019] [68:Congestive Heart Failure, Hypertension, Peripheral Venous Disease, Osteoarthritis 03/23/2020] [71:Congestive Heart Failure,  Hypertension, Peripheral Venous Disease, Osteoarthritis 07/13/2020] Date Acquired: [65:35] [68:18] [71:2] Weeks of Treatment: [65:Open] [68:Open] [71:Healed - Epithelialized] Wound Status: [65:Yes] [68:No] [71:No] Clustered Wound: [65:1] [68:N/A] [71:N/A] Clustered Quantity: [65:1x0.5x0.1] [68:2.2x1x0.1] [71:0x0x0] Measurements L x W x D (cm) [65:0.393] [68:1.728] [71:0] A (cm) : rea [65:0.039] [68:0.173] [71:0] Volume (cm) : [65:99.90%] [68:4.70%] [71:100.00%] % Reduction in Area:  [65:99.90%] [68:4.40%] [71:100.00%] % Reduction in Volume: [65:Full Thickness Without Exposed] [68:Full Thickness Without Exposed] [71:Full Thickness Without Exposed] Classification: [65:Support Structures Medium] [68:Support Structures Medium] [71:Support Structures None Present] Exudate Amount: [65:Serosanguineous] [68:Serosanguineous] [71:N/A] Exudate Type: [65:red, brown] [68:red, brown] [71:N/A] Exudate Color: [65:Flat and Intact] [68:Flat and Intact] [71:Distinct, outline attached] Wound Margin: [65:Large (67-100%)] [68:Large (67-100%)] [71:None Present (0%)] Granulation Amount: [65:Red] [68:Red] [71:N/A] Granulation Quality: [65:None Present (0%)] [68:None Present (0%)] [71:None Present (0%)] Necrotic Amount: [65:Fat Layer (Subcutaneous Tissue): Yes Fat Layer (Subcutaneous Tissue): Yes Fascia: No] Exposed Structures: [65:Fascia: No Tendon: No Muscle: No Joint: No Bone: No Medium (34-66%)] [68:Fascia: No Tendon: No Muscle: No Joint: No Bone: No Small (1-33%)] [71:Fat Layer (Subcutaneous Tissue): No Tendon: No Muscle: No Joint: No Bone: No Large (67-100%)] Epithelialization: [65:Compression Therapy] [68:Compression [place] [71:N/A] Treatment Notes Electronic Signature(s) Signed: 07/28/2020 9:36:19 AM By: Linton Ham MD Signed: 07/28/2020 5:51:14 PM By: [name] RN Entered By: Linton Ham on 07/27/2020 13:17:02 -------------------------------------------------------------------------------- Multi-Disciplinary Care Plan Details Patient Name: Date of Service: [name] [name], [name] [place] W. 07/27/2020 12:30 PM Medical Record Number: [id number] Patient Account Number: [id number] Date of Birth/Sex: Treating RN: [name] [name] (64 y.o. [name] Primary Care Rosela Supak: [name] Other Clinician: Referring Kolbi Altadonna: Treating Bird Swetz/Extender: [name] in Treatment: 35 Active Inactive Wound/Skin Impairment Nursing Diagnoses: Knowledge deficit related to  ulceration/compromised skin integrity Goals: Patient/caregiver will verbalize understanding of skin care regimen Date Initiated: 11/25/2019 Target Resolution Date: 08/20/2020 Goal Status: Active Ulcer/skin breakdown will have a volume reduction of 30% by week 4 Date Initiated: 11/25/2019 Date Inactivated: 4/[name]/2021 Target Resolution Date: 12/26/2019 Goal Status: Met Ulcer/skin breakdown will have a volume reduction of 50% by week 8 Date Initiated: 4/[name]/2021 Date Inactivated: 02/17/2020 Target Resolution Date: 5/[name]/2021 Goal Status: Met Interventions: Assess patient/caregiver ability to obtain necessary supplies Assess patient/caregiver ability to perform ulcer/skin care regimen upon admission and as needed Assess ulceration(s) every visit Notes: Electronic Signature(s) Signed: 07/28/2020 5:51:14 PM By: [name] RN Entered By: [name] on 07/27/2020 12:55:12 -------------------------------------------------------------------------------- Pain Assessment Details Patient Name: Date of Service: [name] Ro RDIN W. 07/27/2020 12:30 PM Medical Record Number: [id number] Patient Account Number: [id number] Date of Birth/Sex: Treating RN: [birth year] (64 y.o. [name] Primary Care Correna Meacham: [name] Other Clinician: Referring Jeson Camacho: Treating Deseri Loss/Extender: [name] in Treatment: 35 Active Problems Location of Pain Severity and Description of Pain Patient Has Paino No Site Locations Pain Management and Medication Current Pain Management: Electronic Signature(s) Signed: 07/29/2020 8:28:44 AM By: Levan Hurst RN, BSN Entered By: Levan Hurst on 07/27/2020 12:54:11 -------------------------------------------------------------------------------- Patient/Caregiver Education Details Patient Name: Date of Service: [name] [name], [name] RDIN W. 11/9/2021andnbsp12:30 PM  Medical Record Number: [id number] Patient Account Number: [id number] Date of  Birth/Gender: Treating RN: [name] [name][birth year] (64 y.o. [name] [name]) [name] Primary Care Physician: [name] Other Clinician: Referring Physician: Treating Physician/Extender: [name] in Treatment: 56 Education Assessment Education Provided To: Patient Education Topics Provided Wound/Skin Impairment: Methods: Explain/Verbal Responses: State content correctly [employer]) Signed: 07/28/2020 5:51:14 PM By: [name] RN Entered By: [name] on 07/27/2020 12:55:43 -------------------------------------------------------------------------------- Wound Assessment Details Patient Name: Date of Service: [name] Ro RDIN W. 07/27/2020 12:30 PM Medical Record Number: [id number] Patient Account Number: [id number] Date of Birth/Sex: Treating RN: [name] [name], [name] [name] (64 y.o. [name] Primary Care Sybrina Laning: [name] Other Clinician: Referring [name] [name]: Treating Cordie Buening/Extender: [name] in Treatment: 35 Wound Status Wound Number: 65 Primary Venous Leg Ulcer Etiology: Wound Location: Left, Distal, Anterior Lower Leg Secondary Lymphedema Wounding Event: Gradually Appeared Etiology: Date Acquired: 09/08/2019 Wound Open Weeks Of Treatment: 35 Status: Clustered Wound: Yes Comorbid Lymphedema, Sleep Apnea, Congestive Heart Failure, History: Hypertension, Peripheral Venous Disease, Osteoarthritis Photos Photo Uploaded By: Mikeal Hawthorne on 07/28/2020 13:33:43 Wound Measurements Length: (cm) Width: (cm) Depth: (cm) Clustered Quantity: Area: (cm) Volume: (cm) 1 % Reduction in Area: 99.9% 0.5 % Reduction in Volume: 99.9% 0.1 Epithelialization: Medium (34-66%) 1 Tunneling: No 0.393 Undermining: No 0.039 Wound Description Classification: Full Thickness Without Exposed Support Structures Wound Margin: Flat and Intact Exudate Amount: Medium Exudate Type: Serosanguineous Exudate Color: red, brown Foul Odor After  Cleansing: No Slough/Fibrino No Wound Bed Granulation Amount: Large (67-100%) Exposed Structure Granulation Quality: Red Fascia Exposed: No Necrotic Amount: None Present (0%) Fat Layer (Subcutaneous Tissue) Exposed: Yes Tendon Exposed: No Muscle Exposed: No Joint Exposed: No Bone Exposed: No Electronic Signature(s) Signed: 07/29/2020 8:28:44 AM By: Levan Hurst RN, BSN Entered By: Levan Hurst on 07/27/2020 13:01:03 -------------------------------------------------------------------------------- Wound Assessment Details Patient Name: Date of Service: [name] [name], [place] W. 07/27/2020 12:30 PM Medical Record Number: [id number] Patient Account Number: [id number] Date of Birth/Sex: Treating RN: [name] [name] (64 y.o. [name] Primary Care Geoffrey Hynes: [name] Other Clinician: Referring Faizah Kandler: Treating Nikko Goldwire/Extender: [name] in Treatment: 35 Wound Status Wound Number: 68 Primary Lymphedema Etiology: Wound Location: Left, Proximal, Anterior Lower Leg Wound Open Wounding Event: Gradually Appeared Status: Date Acquired: 03/23/2020 Comorbid Lymphedema, Sleep Apnea, Congestive Heart Failure, Weeks Of Treatment: 18 History: Hypertension, Peripheral Venous Disease, Osteoarthritis Clustered Wound: No Photos Photo Uploaded By: Mikeal Hawthorne on 07/28/2020 13:33:44 Wound Measurements Length: (cm) 2.2 Width: (cm) 1 Depth: (cm) 0.1 Area: (cm) 1.728 Volume: (cm) 0.173 % Reduction in Area: 4.7% % Reduction in Volume: 4.4% Epithelialization: Small (1-33%) Tunneling: No Undermining: No Wound Description Classification: Full Thickness Without Exposed Support Structures Wound Margin: Flat and Intact Exudate Amount: Medium Exudate Type: Serosanguineous Exudate Color: red, brown Foul Odor After Cleansing: No Slough/Fibrino No Wound Bed Granulation Amount: Large (67-100%) Exposed Structure Granulation Quality: Red Fascia Exposed:  No Necrotic Amount: None Present (0%) Fat Layer (Subcutaneous Tissue) Exposed: Yes Tendon Exposed: No Muscle Exposed: No Joint Exposed: No Bone Exposed: No Electronic Signature(s) Signed: 07/29/2020 8:28:44 AM By: Levan Hurst RN, BSN Entered By: Levan Hurst on 07/27/2020 13:01:19 -------------------------------------------------------------------------------- Wound Assessment Details Patient Name: Date of Service: [name] [name], [place] W. 07/27/2020 12:30 PM Medical Record Number: [id number] Patient Account Number: [id number] Date of Birth/Sex: Treating RN: [birth year] (64 y.o. [name] Primary Care Keymani Glynn: [name] Other Clinician: Referring Sargent Mankey: Treating Alica Shellhammer/Extender: [name] in Treatment: 35 Wound Status Wound Number:  71 Primary Lymphedema Etiology: Wound Location: Left, Proximal, Medial Lower Leg Wound Healed - Epithelialized Wounding Event: Gradually Appeared Status: Date Acquired: 07/13/2020 Comorbid Lymphedema, Sleep Apnea, Congestive Heart Failure, Weeks Of Treatment: 2 History: Hypertension, Peripheral Venous Disease, Osteoarthritis Clustered Wound: No Photos Photo Uploaded By: Mikeal Hawthorne on 07/28/2020 13:34:13 Wound Measurements Length: (cm) Width: (cm) Depth: (cm) Area: (cm) Volume: (cm) 0 % Reduction in Area: 100% 0 % Reduction in Volume: 100% 0 Epithelialization: Large (67-100%) 0 Tunneling: No 0 Undermining: No Wound Description Classification: Full Thickness Without Exposed Support Structures Wound Margin: Distinct, outline attached Exudate Amount: None Present Foul Odor After Cleansing: No Slough/Fibrino No Wound Bed Granulation Amount: None Present (0%) Exposed Structure Necrotic Amount: None Present (0%) Fascia Exposed: No Fat Layer (Subcutaneous Tissue) Exposed: No Tendon Exposed: No Muscle Exposed: No Joint Exposed: No Bone Exposed: No Electronic Signature(s) Signed: 07/29/2020  8:28:44 AM By: Levan Hurst RN, BSN Entered By: Levan Hurst on 07/27/2020 13:01:37 -------------------------------------------------------------------------------- Vitals Details Patient Name: Date of Service: [name] [name], [place] W. 07/27/2020 12:30 PM Medical Record Number: [id number] Patient Account Number: [id number] Date of Birth/Sex: Treating RN: 12/[name]/[name] [name] (64 y.o. [name] Primary Care Caster Fayette: [name] Other Clinician: Referring Dacota Ruben: Treating Rainier Feuerborn/Extender: [name] in Treatment: 35 Vital Signs Time Taken: 12:50 Temperature (F): 97.9 Height (in): 70 Pulse (bpm): 72 Weight (lbs): 360 Respiratory Rate (breaths/min): 22 Body Mass Index (BMI): 51.6 Blood Pressure (mmHg): 178/89 Reference Range: 80 - 120 mg / dl Electronic Signature(s) Signed: 07/29/2020 8:28:44 AM By: Levan Hurst RN, BSN Entered By: Levan Hurst on 07/27/2020 12:53:30

## 2020-07-30 DIAGNOSIS — I110 Hypertensive heart disease with heart failure: Secondary | ICD-10-CM | POA: Diagnosis not present

## 2020-07-30 DIAGNOSIS — L97821 Non-pressure chronic ulcer of other part of left lower leg limited to breakdown of skin: Secondary | ICD-10-CM | POA: Diagnosis not present

## 2020-07-30 DIAGNOSIS — E669 Obesity, unspecified: Secondary | ICD-10-CM | POA: Diagnosis not present

## 2020-07-30 DIAGNOSIS — I87333 Chronic venous hypertension (idiopathic) with ulcer and inflammation of bilateral lower extremity: Secondary | ICD-10-CM | POA: Diagnosis not present

## 2020-07-30 DIAGNOSIS — G4733 Obstructive sleep apnea (adult) (pediatric): Secondary | ICD-10-CM | POA: Diagnosis not present

## 2020-07-30 DIAGNOSIS — I5032 Chronic diastolic (congestive) heart failure: Secondary | ICD-10-CM | POA: Diagnosis not present

## 2020-08-02 DIAGNOSIS — Z6841 Body Mass Index (BMI) 40.0 and over, adult: Secondary | ICD-10-CM | POA: Diagnosis not present

## 2020-08-02 DIAGNOSIS — N419 Inflammatory disease of prostate, unspecified: Secondary | ICD-10-CM | POA: Diagnosis not present

## 2020-08-03 DIAGNOSIS — E669 Obesity, unspecified: Secondary | ICD-10-CM | POA: Diagnosis not present

## 2020-08-03 DIAGNOSIS — L97821 Non-pressure chronic ulcer of other part of left lower leg limited to breakdown of skin: Secondary | ICD-10-CM | POA: Diagnosis not present

## 2020-08-03 DIAGNOSIS — G4733 Obstructive sleep apnea (adult) (pediatric): Secondary | ICD-10-CM | POA: Diagnosis not present

## 2020-08-03 DIAGNOSIS — I87333 Chronic venous hypertension (idiopathic) with ulcer and inflammation of bilateral lower extremity: Secondary | ICD-10-CM | POA: Diagnosis not present

## 2020-08-03 DIAGNOSIS — I110 Hypertensive heart disease with heart failure: Secondary | ICD-10-CM | POA: Diagnosis not present

## 2020-08-03 DIAGNOSIS — I5032 Chronic diastolic (congestive) heart failure: Secondary | ICD-10-CM | POA: Diagnosis not present

## 2020-08-04 DIAGNOSIS — I5032 Chronic diastolic (congestive) heart failure: Secondary | ICD-10-CM | POA: Diagnosis not present

## 2020-08-04 DIAGNOSIS — E669 Obesity, unspecified: Secondary | ICD-10-CM | POA: Diagnosis not present

## 2020-08-04 DIAGNOSIS — I87333 Chronic venous hypertension (idiopathic) with ulcer and inflammation of bilateral lower extremity: Secondary | ICD-10-CM | POA: Diagnosis not present

## 2020-08-04 DIAGNOSIS — I110 Hypertensive heart disease with heart failure: Secondary | ICD-10-CM | POA: Diagnosis not present

## 2020-08-04 DIAGNOSIS — G4733 Obstructive sleep apnea (adult) (pediatric): Secondary | ICD-10-CM | POA: Diagnosis not present

## 2020-08-04 DIAGNOSIS — L97821 Non-pressure chronic ulcer of other part of left lower leg limited to breakdown of skin: Secondary | ICD-10-CM | POA: Diagnosis not present

## 2020-08-10 ENCOUNTER — Encounter (HOSPITAL_BASED_OUTPATIENT_CLINIC_OR_DEPARTMENT_OTHER): Payer: Medicare Other | Admitting: Internal Medicine

## 2020-08-11 ENCOUNTER — Encounter (HOSPITAL_BASED_OUTPATIENT_CLINIC_OR_DEPARTMENT_OTHER): Payer: Medicare Other | Admitting: Physician Assistant

## 2020-08-11 DIAGNOSIS — I87333 Chronic venous hypertension (idiopathic) with ulcer and inflammation of bilateral lower extremity: Secondary | ICD-10-CM | POA: Diagnosis not present

## 2020-08-11 DIAGNOSIS — E669 Obesity, unspecified: Secondary | ICD-10-CM | POA: Diagnosis not present

## 2020-08-11 DIAGNOSIS — I110 Hypertensive heart disease with heart failure: Secondary | ICD-10-CM | POA: Diagnosis not present

## 2020-08-11 DIAGNOSIS — I5032 Chronic diastolic (congestive) heart failure: Secondary | ICD-10-CM | POA: Diagnosis not present

## 2020-08-11 DIAGNOSIS — L97821 Non-pressure chronic ulcer of other part of left lower leg limited to breakdown of skin: Secondary | ICD-10-CM | POA: Diagnosis not present

## 2020-08-11 DIAGNOSIS — G4733 Obstructive sleep apnea (adult) (pediatric): Secondary | ICD-10-CM | POA: Diagnosis not present

## 2020-08-15 DIAGNOSIS — I87333 Chronic venous hypertension (idiopathic) with ulcer and inflammation of bilateral lower extremity: Secondary | ICD-10-CM | POA: Diagnosis not present

## 2020-08-15 DIAGNOSIS — I110 Hypertensive heart disease with heart failure: Secondary | ICD-10-CM | POA: Diagnosis not present

## 2020-08-15 DIAGNOSIS — E669 Obesity, unspecified: Secondary | ICD-10-CM | POA: Diagnosis not present

## 2020-08-15 DIAGNOSIS — Z9981 Dependence on supplemental oxygen: Secondary | ICD-10-CM | POA: Diagnosis not present

## 2020-08-15 DIAGNOSIS — I5032 Chronic diastolic (congestive) heart failure: Secondary | ICD-10-CM | POA: Diagnosis not present

## 2020-08-15 DIAGNOSIS — L97821 Non-pressure chronic ulcer of other part of left lower leg limited to breakdown of skin: Secondary | ICD-10-CM | POA: Diagnosis not present

## 2020-08-15 DIAGNOSIS — G4733 Obstructive sleep apnea (adult) (pediatric): Secondary | ICD-10-CM | POA: Diagnosis not present

## 2020-08-15 DIAGNOSIS — Z8673 Personal history of transient ischemic attack (TIA), and cerebral infarction without residual deficits: Secondary | ICD-10-CM | POA: Diagnosis not present

## 2020-08-17 ENCOUNTER — Encounter (HOSPITAL_BASED_OUTPATIENT_CLINIC_OR_DEPARTMENT_OTHER): Payer: Medicare Other | Admitting: Internal Medicine

## 2020-08-17 ENCOUNTER — Other Ambulatory Visit: Payer: Self-pay

## 2020-08-17 DIAGNOSIS — I872 Venous insufficiency (chronic) (peripheral): Secondary | ICD-10-CM | POA: Diagnosis not present

## 2020-08-17 DIAGNOSIS — E1121 Type 2 diabetes mellitus with diabetic nephropathy: Secondary | ICD-10-CM | POA: Diagnosis not present

## 2020-08-17 DIAGNOSIS — I87333 Chronic venous hypertension (idiopathic) with ulcer and inflammation of bilateral lower extremity: Secondary | ICD-10-CM | POA: Diagnosis not present

## 2020-08-17 DIAGNOSIS — L03115 Cellulitis of right lower limb: Secondary | ICD-10-CM | POA: Diagnosis not present

## 2020-08-17 DIAGNOSIS — L97811 Non-pressure chronic ulcer of other part of right lower leg limited to breakdown of skin: Secondary | ICD-10-CM | POA: Diagnosis not present

## 2020-08-17 DIAGNOSIS — E11622 Type 2 diabetes mellitus with other skin ulcer: Secondary | ICD-10-CM | POA: Diagnosis not present

## 2020-08-17 DIAGNOSIS — L97821 Non-pressure chronic ulcer of other part of left lower leg limited to breakdown of skin: Secondary | ICD-10-CM | POA: Diagnosis not present

## 2020-08-17 DIAGNOSIS — I5033 Acute on chronic diastolic (congestive) heart failure: Secondary | ICD-10-CM | POA: Diagnosis not present

## 2020-08-17 DIAGNOSIS — I890 Lymphedema, not elsewhere classified: Secondary | ICD-10-CM | POA: Diagnosis not present

## 2020-08-17 DIAGNOSIS — I110 Hypertensive heart disease with heart failure: Secondary | ICD-10-CM | POA: Diagnosis not present

## 2020-08-17 NOTE — Progress Notes (Signed)
[name] [name] ([id number]) , Visit Report for 08/17/2020 Arrival Information Details Patient Name: Date of Service: [name] [name] [name] W. 08/17/2020 12:30 PM Medical Record Number: [id number] Patient Account Number: [id number] Date of Birth/Sex: Treating RN: [birth year] (64 y.o. [name] [name] Primary Care [name] [name]: [name] [name] Other Clinician: Referring [name]: Treating [name] [name]/Extender: [name] [name] in Treatment: 38 Visit Information History Since Last Visit Added or deleted any medications: No Patient Arrived: [name] [name] Any new allergies or adverse reactions: No Arrival Time: 12:55 Had a fall or experienced change in No Accompanied By: self activities of daily living that may affect Transfer Assistance: None risk of falls: Patient Identification Verified: Yes Signs or symptoms of abuse/neglect since last visito No Secondary Verification Process Completed: Yes Hospitalized since last visit: No Patient Requires Transmission-Based Precautions: No Implantable device outside of the clinic excluding No Patient Has Alerts: Yes cellular tissue based products placed in the center Patient Alerts: R ABI = 1.25 since last visit: L ABI = 1.27 Has Dressing in Place as Prescribed: Yes Has Compression in Place as Prescribed: Yes Pain Present Now: No Electronic Signature(s) Signed: 08/17/2020 6:04:45 PM By: [name] Gouty RN, BSN Entered By: [name] [name] on 08/17/2020 12:56:23 -------------------------------------------------------------------------------- Compression Therapy Details Patient Name: Date of Service: [name] [name], [place]. 08/17/2020 12:30 PM Medical Record Number: [id number] Patient Account Number: [id number] Date of Birth/Sex: Treating RN: 12-05-1955 (64 y.o. [name] [name] Primary Care [name] [name]: [name] [name] Other Clinician: Referring [name] [name]: Treating [name] [name]/Extender: [name] [name] in Treatment: 38 Compression  Therapy Performed for Wound Assessment: Wound #65 Left,Distal,Anterior Lower Leg Performed By: Clinician [name] Coria, RN Compression Type: Four Layer Post Procedure Diagnosis Same as Pre-procedure Electronic Signature(s) Signed: 08/17/2020 5:56:00 PM By: [name] Coria RN Entered By: [name] [name] on 08/17/2020 13:32:26 -------------------------------------------------------------------------------- Compression Therapy Details Patient Name: Date of Service: [name] [name] [name] W. 08/17/2020 12:30 PM Medical Record Number: [id number] Patient Account Number: [id number] Date of Birth/Sex: Treating RN: [birth year] (64 y.o. [name] [name] Primary Care [name] [name]: [name] [name] Other Clinician: Referring [place] Latulippe: Treating [name] [name]/Extender: [name] [name] in Treatment: 38 Compression Therapy Performed for Wound Assessment: Wound #68 Left,Proximal,Anterior Lower Leg Performed By: Clinician [name] Coria, RN Compression Type: Four Layer Post Procedure Diagnosis Same as Pre-procedure Electronic Signature(s) Signed: 08/17/2020 5:56:00 PM By: [name] Coria RN Entered By: [name] [name] on 08/17/2020 13:32:26 -------------------------------------------------------------------------------- Compression Therapy Details Patient Name: Date of Service: [name] [name] [name] W. 08/17/2020 12:30 PM Medical Record Number: [id number] Patient Account Number: [id number] Date of Birth/Sex: Treating RN: [birth year] (64 y.o. [name] [name] Primary Care Kelbi Renstrom: [name] [name] Other Clinician: Referring [name] [name]: Treating [name] [name]/Extender: [name] [name] in Treatment: 38 Compression Therapy Performed for Wound Assessment: Wound #72 Right,Anterior Lower Leg Performed By: Clinician [name] Coria, RN Compression Type: Four Layer Post Procedure Diagnosis Same as Pre-procedure Electronic Signature(s) Signed: 08/17/2020 5:56:00 PM By: [name] Coria RN Entered By: [name] [name] on  08/17/2020 13:32:26 -------------------------------------------------------------------------------- Compression Therapy Details Patient Name: Date of Service: [name] [name] [name] W. 08/17/2020 12:30 PM Medical Record Number: [id number] Patient Account Number: [id number] Date of Birth/Sex: Treating RN: 06-08-1956 (64 y.o. [name] [name] Primary Care Mikiah Demond: [name] [name] Other Clinician: Referring [name] [name]: Treating [name] [name]/Extender: [name] [name] in Treatment: 38 Compression Therapy Performed for Wound Assessment: Wound #73 Right,Lateral Lower Leg Performed By: Clinician [name] Coria, RN Compression Type: Four Layer Post Procedure Diagnosis Same as Pre-procedure Electronic Signature(s) Signed: 08/17/2020 5:56:00 PM By: [name] Coria RN  Entered By: [name] [name] on 08/17/2020 13:32:26 -------------------------------------------------------------------------------- Encounter Discharge Information Details Patient Name: Date of Service: [name] [name] [name] W. 08/17/2020 12:30 PM Medical Record Number: [id number] Patient Account Number: [id number] Date of Birth/Sex: Treating RN: 04-15-1956 (64 y.o. [name] [name] Primary Care [name] [name]: [name] [name] Other Clinician: Referring [name] [name]: Treating [name] [name]/Extender: [name] [name] in Treatment: 38 Encounter Discharge Information Items Discharge Condition: Stable Ambulatory Status: Cane Discharge Destination: Home Transportation: Private Auto Accompanied By: self Schedule Follow-up Appointment: Yes Clinical Summary of Care: Electronic Signature(s) Signed: 08/17/2020 5:54:32 PM By: [name] [name] Entered By: [name] [name] on 08/17/2020 13:55:57 -------------------------------------------------------------------------------- Lower Extremity Assessment Details Patient Name: Date of Service: [name] [name] [name] W. 08/17/2020 12:30 PM Medical Record Number: [id number] Patient Account Number:  [id number] Date of Birth/Sex: Treating RN: December 22, 1955 (64 y.o. [name] [name] Primary Care [name] [name]: [name] [name] Other Clinician: Referring [name] [name]: Treating [name] [name]/Extender: [name] [name] in Treatment: 38 Edema Assessment Assessed: [name]: No] [name]: No] Edema: [Left: Yes] [Right: Yes] Calf Left: Right: Point of Measurement: 30 cm From Medial Instep 52.5 cm 60 cm Ankle Left: Right: Point of Measurement: 13 cm From Medial Instep 25 cm 30 cm Vascular Assessment Pulses: Dorsalis Pedis Palpable: [Left:Yes] [Right:Yes] Electronic Signature(s) Signed: 08/17/2020 6:04:45 PM By: [name] Gouty RN, BSN Entered By: [name] [name] on 08/17/2020 13:11:35 -------------------------------------------------------------------------------- Multi Wound Chart Details Patient Name: Date of Service: [name] [name], HA [name] W. 08/17/2020 12:30 PM Medical Record Number: [id number] Patient Account Number: [id number] Date of Birth/Sex: Treating RN: [birth year] (64 y.o. [name]) [name] [name] Primary Care Anupama Piehl: [name] [name] Other Clinician: Referring Kashay Cavenaugh: Treating Sundy Houchins/Extender: [name] [name] in Treatment: 38 Vital Signs Height(in): 70 Pulse(bpm): 10 Weight(lbs): 360 Blood Pressure(mmHg): 141/84 Body Mass Index(BMI): 52 Temperature(F): 98.0 Respiratory Rate(breaths/min): 22 Photos: [65:No Photos Left, Distal, Anterior Lower Leg] [68:No Photos Left, Proximal, Anterior Lower Leg] [72:No Photos Right, Anterior Lower Leg] Wound Location: [65:Gradually Appeared] [68:Gradually Appeared] [72:Gradually Appeared] Wounding Event: [65:Venous Leg Ulcer] [68:Lymphedema] [72:Lymphedema] Primary Etiology: [65:Lymphedema] [68:N/A] [72:N/A] Secondary Etiology: [65:Lymphedema, Sleep Apnea,] [68:Lymphedema, Sleep Apnea,] [72:Lymphedema, Sleep Apnea,] Comorbid History: [65:Congestive Heart Failure, Hypertension, Peripheral Venous Disease, Osteoarthritis  09/08/2019] [68:Congestive Heart Failure, Hypertension, Peripheral Venous Disease, Osteoarthritis 03/23/2020] [72:Congestive Heart Failure,  Hypertension, Peripheral Venous Disease, Osteoarthritis 08/17/2020] Date Acquired: [65:38] [68:21] [72:0] Weeks of Treatment: [65:Open] [68:Open] [72:Open] Wound Status: [65:Yes] [68:No] [72:No] Clustered Wound: [65:1] [68:N/A] [72:N/A] Clustered Quantity: [65:10x10.5x0.1] [68:1.1x0.4x0.2] [72:0.8x1.3x0.1] Measurements L x W x D (cm) [65:82.467] [68:0.346] [72:0.817] A (cm) : rea [65:8.247] [68:0.069] [72:0.082] Volume (cm) : [65:86.60%] [68:80.90%] [72:N/A] % Reduction in Area: [65:86.60%] [68:61.90%] [72:N/A] % Reduction in Volume: [65:Full Thickness Without Exposed] [68:Full Thickness Without Exposed] [72:Full Thickness Without Exposed] Classification: [65:Support Structures Medium] [68:Support Structures Medium] [72:Support Structures Medium] Exudate Amount: [65:Serosanguineous] [68:Serosanguineous] [72:Serosanguineous] Exudate Type: [65:red, brown] [68:red, brown] [72:red, brown] Exudate Color: [65:Flat and Intact] [68:Flat and Intact] [72:Flat and Intact] Wound Margin: [65:Large (67-100%)] [68:Large (67-100%)] [72:Large (67-100%)] Granulation Amount: [65:Red] [68:Red] [72:Red] Granulation Quality: [65:None Present (0%)] [68:None Present (0%)] [72:None Present (0%)] Necrotic Amount: [65:Fat Layer (Subcutaneous Tissue): Yes Fat Layer (Subcutaneous Tissue): Yes Fat Layer (Subcutaneous Tissue): Yes] Exposed Structures: [65:Fascia: No Tendon: No Muscle: No Joint: No Bone: No Medium (34-66%)] [68:Fascia: No Tendon: No Muscle: No Joint: No Bone: No Small (1-33%)] [72:Fascia: No Tendon: No Muscle: No Joint: No Bone: No Small (1-33%)] Epithelialization: [65:Compression Therapy] [68:Compression Therapy] [72:Compression [place] Wound Number: 65 N/A N/A Photos: No Photos N/A N/A Right, Lateral Lower Leg N/A N/A Wound Location:  Gradually Appeared N/A  N/A Wounding Event: Lymphedema N/A N/A Primary Etiology: N/A N/A N/A Secondary Etiology: Lymphedema, Sleep Apnea, N/A N/A Comorbid History: Congestive Heart Failure, Hypertension, Peripheral Venous Disease, Osteoarthritis 08/03/2020 N/A N/A Date Acquired: 0 N/A N/A Weeks of Treatment: Open N/A N/A Wound Status: No N/A N/A Clustered Wound: N/A N/A N/A Clustered Quantity: 9x11x0.1 N/A N/A Measurements L x W x D (cm) 77.754 N/A N/A A (cm) : rea 7.775 N/A N/A Volume (cm) : N/A N/A N/A % Reduction in Area: N/A N/A N/A % Reduction in Volume: Full Thickness Without Exposed N/A N/A Classification: Support Structures Medium N/A N/A Exudate Amount: Serosanguineous N/A N/A Exudate Type: red, brown N/A N/A Exudate Color: Flat and Intact N/A N/A Wound Margin: Large (67-100%) N/A N/A Granulation Amount: Red N/A N/A Granulation Quality: None Present (0%) N/A N/A Necrotic Amount: Fat Layer (Subcutaneous Tissue): Yes N/A N/A Exposed Structures: Fascia: No Tendon: No Muscle: No Joint: No Bone: No Medium (34-66%) N/A N/A Epithelialization: Compression Therapy N/A N/A Procedures Performed: Treatment Notes Electronic Signature(s) Signed: 08/17/2020 5:54:56 PM By: Linton Ham MD Signed: 08/17/2020 5:56:00 PM By: [name] Coria RN Entered By: Linton Ham on 08/17/2020 13:34:43 -------------------------------------------------------------------------------- Multi-Disciplinary Care [name] Details Patient Name: Date of Service: [name] [name], HA [name] W. 08/17/2020 12:30 PM Medical Record Number: [id number] Patient Account Number: [id number] Date of Birth/Sex: Treating RN: 1956/07/26 (64 y.o. [name] [name] Primary Care Osa Campoli: [name] [name] Other Clinician: Referring Ceniyah Thorp: Treating Dezirea Mccollister/Extender: [name] [name] in Treatment: 38 Active Inactive Wound/Skin Impairment Nursing Diagnoses: Knowledge deficit related to  ulceration/compromised skin integrity Goals: Patient/caregiver will verbalize understanding of skin care regimen Date Initiated: 11/25/2019 Target Resolution Date: 08/20/2020 Goal Status: Active Ulcer/skin breakdown will have a volume reduction of 30% by week 4 Date Initiated: 11/25/2019 Date Inactivated: 01/06/2020 Target Resolution Date: 12/26/2019 Goal Status: Met Ulcer/skin breakdown will have a volume reduction of 50% by week 8 Date Initiated: 01/06/2020 Date Inactivated: 02/17/2020 Target Resolution Date: 02/05/2020 Goal Status: Met Interventions: Assess patient/caregiver ability to obtain necessary supplies Assess patient/caregiver ability to perform ulcer/skin care regimen upon admission and as needed Assess ulceration(s) every visit Notes: Electronic Signature(s) Signed: 08/17/2020 5:56:00 PM By: [name] Coria RN Entered By: [name] [name] on 08/17/2020 13:09:20 -------------------------------------------------------------------------------- Pain Assessment Details Patient Name: Date of Service: [name] [name] [name] W. 08/17/2020 12:30 PM Medical Record Number: [id number] Patient Account Number: [id number] Date of Birth/Sex: Treating RN: 07/14/1956 (64 y.o. [name] [name] Primary Care Lucindia Lemley: [name] [name] Other Clinician: Referring Ommie Degeorge: Treating Brindley Madarang/Extender: [name] [name] in Treatment: 38 Active Problems Location of Pain Severity and Description of Pain Patient Has Paino No Site Locations Rate the pain. Current Pain Level: 0 Pain Management and Medication Current Pain Management: Electronic Signature(s) Signed: 08/17/2020 6:04:45 PM By: [name] Gouty RN, BSN Entered By: [name] [name] on 08/17/2020 12:56:36 -------------------------------------------------------------------------------- Patient/Caregiver Education Details Patient Name: Date of Service: [name] [name] 11/30/2021andnbsp12:30 PM Medical Record Number:  [id number] Patient Account Number: [id number] Date of Birth/Gender: Treating RN: 05-Jul-1956 (64 y.o. [name] [name] Primary Care Physician: [name] [name] Other Clinician: Referring Physician: Treating Physician/Extender: [name] [name] in Treatment: 21 Education Assessment Education Provided To: Patient Education Topics Provided Wound/Skin Impairment: Methods: Explain/Verbal Responses: State content correctly Electronic Signature(s) Signed: 08/17/2020 5:56:00 PM By: [name] Coria RN Entered By: [name] [name] on 08/17/2020 13:09:38 -------------------------------------------------------------------------------- Wound Assessment Details Patient Name: Date of Service: [name] [name]. 08/17/2020 12:30 PM Medical Record Number: [id number] Patient Account Number:  [id number] Date of Birth/Sex: Treating RN: 1955/09/24 (64 y.o. [name] [name] Primary Care Chanette Demo: [name] [name] Other Clinician: Referring Jakarri Lesko: Treating Lindy Pennisi/Extender: [name] [name] in Treatment: 38 Wound Status Wound Number: 65 Primary Venous Leg Ulcer Etiology: Wound Location: Left, Distal, Anterior Lower Leg Secondary Lymphedema Wounding Event: Gradually Appeared Etiology: Date Acquired: 09/08/2019 Wound Open Weeks Of Treatment: 38 Status: Clustered Wound: Yes Comorbid Lymphedema, Sleep Apnea, Congestive Heart Failure, History: Hypertension, Peripheral Venous Disease, Osteoarthritis Wound Measurements Length: (cm) 10 Width: (cm) 10.5 Depth: (cm) 0.1 Clustered Quantity: 1 Area: (cm) 82.467 Volume: (cm) 8.247 % Reduction in Area: 86.6% % Reduction in Volume: 86.6% Epithelialization: Medium (34-66%) Tunneling: No Undermining: No Wound Description Classification: Full Thickness Without Exposed Support Structures Wound Margin: Flat and Intact Exudate Amount: Medium Exudate Type: Serosanguineous Exudate Color: red, brown Foul Odor After  Cleansing: No Slough/Fibrino No Wound Bed Granulation Amount: Large (67-100%) Exposed Structure Granulation Quality: Red Fascia Exposed: No Necrotic Amount: None Present (0%) Fat Layer (Subcutaneous Tissue) Exposed: Yes Tendon Exposed: No Muscle Exposed: No Joint Exposed: No Bone Exposed: No Treatment Notes Wound #65 (Left, Distal, Anterior Lower Leg) 1. Cleanse With Wound Cleanser Soap and water 2. Periwound Care Moisturizing lotion TCA Cream 3. Primary Dressing Applied Calcium Alginate Ag 4. Secondary Dressing ABD Pad Dry Gauze 6. Support Layer Applied 4 layer compression wrap Notes unna boot first layer applied to upper portion of lower leg. Electronic Signature(s) Signed: 08/17/2020 6:04:45 PM By: [name] Gouty RN, BSN Entered By: [name] [name] on 08/17/2020 13:15:01 -------------------------------------------------------------------------------- Wound Assessment Details Patient Name: Date of Service: [name] [name], HA [name] W. 08/17/2020 12:30 PM Medical Record Number: [id number] Patient Account Number: [id number] Date of Birth/Sex: Treating RN: [birth year] (64 y.o. [name] [name] Primary Care Twanisha Foulk: [name] [name] Other Clinician: Referring Gerrald Basu: Treating Emali Heyward/Extender: [name] [name] in Treatment: 38 Wound Status Wound Number: 68 Primary Lymphedema Etiology: Wound Location: Left, Proximal, Anterior Lower Leg Wound Open Wounding Event: Gradually Appeared Status: Date Acquired: 03/23/2020 Comorbid Lymphedema, Sleep Apnea, Congestive Heart Failure, Weeks Of Treatment: 21 History: Hypertension, Peripheral Venous Disease, Osteoarthritis Clustered Wound: No Wound Measurements Length: (cm) 1.1 Width: (cm) 0.4 Depth: (cm) 0.2 Area: (cm) 0.346 Volume: (cm) 0.069 % Reduction in Area: 80.9% % Reduction in Volume: 61.9% Epithelialization: Small (1-33%) Tunneling: No Undermining: No Wound Description Classification: Full  Thickness Without Exposed Support Structures Wound Margin: Flat and Intact Exudate Amount: Medium Exudate Type: Serosanguineous Exudate Color: red, brown Foul Odor After Cleansing: No Slough/Fibrino No Wound Bed Granulation Amount: Large (67-100%) Exposed Structure Granulation Quality: Red Fascia Exposed: No Necrotic Amount: None Present (0%) Fat Layer (Subcutaneous Tissue) Exposed: Yes Tendon Exposed: No Muscle Exposed: No Joint Exposed: No Bone Exposed: No Treatment Notes Wound #68 (Left, Proximal, Anterior Lower Leg) 1. Cleanse With Wound Cleanser Soap and water 2. Periwound Care Moisturizing lotion TCA Cream 3. Primary Dressing Applied Calcium Alginate Ag 4. Secondary Dressing ABD Pad Dry Gauze 6. Support Layer Applied 4 layer compression wrap Notes unna boot first layer applied to upper portion of lower leg. Electronic Signature(s) Signed: 08/17/2020 6:04:45 PM By: [name] Gouty RN, BSN Entered By: [name] [name] on 08/17/2020 13:15:19 -------------------------------------------------------------------------------- Wound Assessment Details Patient Name: Date of Service: [name] [name], HA [name] W. 08/17/2020 12:30 PM Medical Record Number: [id number] Patient Account Number: [id number] Date of Birth/Sex: Treating RN: 12/09/1955 (64 y.o. [name] [name] Primary Care Tajuanna Burnett: [name] [name] Other Clinician: Referring Blu Mcglaun: Treating Anisten Tomassi/Extender: [name] [name] in Treatment: 38 Wound Status Wound  Number: 34 Primary Lymphedema Etiology: Wound Location: Right, Anterior Lower Leg Wound Open Wounding Event: Gradually Appeared Status: Date Acquired: 08/17/2020 Comorbid Lymphedema, Sleep Apnea, Congestive Heart Failure, Weeks Of Treatment: 0 History: Hypertension, Peripheral Venous Disease, Osteoarthritis Clustered Wound: No Wound Measurements Length: (cm) 0.8 Width: (cm) 1.3 Depth: (cm) 0.1 Area: (cm) 0.817 Volume: (cm) 0.082 %  Reduction in Area: % Reduction in Volume: Epithelialization: Small (1-33%) Tunneling: No Undermining: No Wound Description Classification: Full Thickness Without Exposed Support Structures Wound Margin: Flat and Intact Exudate Amount: Medium Exudate Type: Serosanguineous Exudate Color: red, brown Foul Odor After Cleansing: No Slough/Fibrino No Wound Bed Granulation Amount: Large (67-100%) Exposed Structure Granulation Quality: Red Fascia Exposed: No Necrotic Amount: None Present (0%) Fat Layer (Subcutaneous Tissue) Exposed: Yes Tendon Exposed: No Muscle Exposed: No Joint Exposed: No Bone Exposed: No Treatment Notes Wound #72 (Right, Anterior Lower Leg) 1. Cleanse With Wound Cleanser Soap and water 2. Periwound Care Moisturizing lotion TCA Cream 3. Primary Dressing Applied Calcium Alginate Ag 4. Secondary Dressing ABD Pad Dry Gauze 6. Support Layer Applied 4 layer compression wrap Notes unna boot first layer applied to upper portion of lower leg. Electronic Signature(s) Signed: 08/17/2020 6:04:45 PM By: [name] Gouty RN, BSN Entered By: [name] [name] on 08/17/2020 13:16:47 -------------------------------------------------------------------------------- Wound Assessment Details Patient Name: Date of Service: [name] [name], HA [name] W. 08/17/2020 12:30 PM Medical Record Number: [id number] Patient Account Number: [id number] Date of Birth/Sex: Treating RN: 04/09/1956 (64 y.o. [name] [name] Primary Care Milly Goggins: [name] [name] Other Clinician: Referring Yahayra Geis: Treating Lizzette Carbonell/Extender: [name] [name] in Treatment: 38 Wound Status Wound Number: 73 Primary Lymphedema Etiology: Wound Location: Right, Lateral Lower Leg Wound Open Wounding Event: Gradually Appeared Status: Date Acquired: 08/03/2020 Comorbid Lymphedema, Sleep Apnea, Congestive Heart Failure, Weeks Of Treatment: 0 History: Hypertension, Peripheral Venous Disease,  Osteoarthritis Clustered Wound: No Wound Measurements Length: (cm) 9 Width: (cm) 11 Depth: (cm) 0.1 Area: (cm) 77.754 Volume: (cm) 7.775 % Reduction in Area: % Reduction in Volume: Epithelialization: Medium (34-66%) Tunneling: No Undermining: No Wound Description Classification: Full Thickness Without Exposed Support Structures Wound Margin: Flat and Intact Exudate Amount: Medium Exudate Type: Serosanguineous Exudate Color: red, brown Foul Odor After Cleansing: No Slough/Fibrino No Wound Bed Granulation Amount: Large (67-100%) Exposed Structure Granulation Quality: Red Fascia Exposed: No Necrotic Amount: None Present (0%) Fat Layer (Subcutaneous Tissue) Exposed: Yes Tendon Exposed: No Muscle Exposed: No Joint Exposed: No Bone Exposed: No Treatment Notes Wound #73 (Right, Lateral Lower Leg) 1. Cleanse With Wound Cleanser Soap and water 2. Periwound Care Moisturizing lotion TCA Cream 3. Primary Dressing Applied Calcium Alginate Ag 4. Secondary Dressing ABD Pad Dry Gauze 6. Support Layer Applied 4 layer compression wrap Notes unna boot first layer applied to upper portion of lower leg. Electronic Signature(s) Signed: 08/17/2020 6:04:45 PM By: [name] Gouty RN, BSN Entered By: [name] [name] on 08/17/2020 13:18:06 -------------------------------------------------------------------------------- Vitals Details Patient Name: Date of Service: [name] [name], HA [name] W. 08/17/2020 12:30 PM Medical Record Number: [id number] Patient Account Number: [id number] Date of Birth/Sex: Treating RN: [birth year] (64 y.o. [name] [name] Primary Care Dariela Stoker: [name] [name] Other Clinician: Referring Muath Hallam: Treating Clemie General/Extender: [name] [name] in Treatment: 38 Vital Signs Time Taken: 12:57 Temperature (F): 98.0 Height (in): 70 Pulse (bpm): 76 Source: Stated Respiratory Rate (breaths/min): 22 Weight (lbs): 360 Blood Pressure (mmHg):  141/84 Source: Stated Reference Range: 80 - 120 mg / dl Body Mass Index (BMI): 51.6 Electronic Signature(s) Signed: 08/17/2020 6:04:45 PM By: [name] Gouty RN, BSN  Entered By: [name] [name] on 08/17/2020 12:58:26

## 2020-08-17 NOTE — Progress Notes (Signed)
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 layer compression. Silver alginate to any wounds. He tells me he is been using his compression pumps on most days want sometimes twice. He has home health out to his home to change the dressing 06/14/2018; patient comes in for monthly visit. He has not been using his  pumps because his wife is been in the hospital at [facility]. Nevertheless he arrives with less edema in his legs and his edema under fairly good control. He has the 4 layer wraps being changed by home health. We have been using silver alginate to the primary weeping areas on the lateral legs bilaterally 07/12/2018; the patient has been caring for his wife who is a resident at the nursing home connected with more at hospital. I think she was admitted with congestive heart failure. I am not sure about the frequency uses his pumps. He has home health changing his compression wraps once a week. He does not have any open wounds on the right leg. A smattering of small open areas across the mid left tibial area. We have been using silver alginate 08/21/18 evaluation today patient continues to unfortunately not use the compression pumps for his lymphedema on a regular basis. We are wrapping his left lower extremity he still has some open areas although to some degree they are better than what I've seen before. He does have some pain at the site. No  fevers, chills, nausea, or vomiting noted at this time. 09/27/2018. I have not seen this patient and probably 2-1/2 months. He has bilateral lymphedema. By review he was seen by [facility] stone on 12/4. I think at this time he had some wounds on the left but none on the right he was therefore put in his extremitease stocking on the right. Sometime after this he had a wound develop on the right medial calf and they have been wrapping him ever since. 2/6; is a patient with severe chronic venous insufficiency and secondary lymphedema. He has compression pumps and does not use them. He has much improved wounds on the bilateral lower legs. He arrives for monthly follow-up. 3/13; monthly follow-up. Patient's legs look much the same bilateral scattering of small wounds but with tremendous leaking lymphedema. His edema control is not too bad but I certainly do not think this is  going to heal. He will not use compression pumps. Silver alginate is the primary dressing 4/14; monthly follow-up. Patient is largely deteriorated he has a smattering of multiple open small areas on the left lateral calf with areas of denuded full- thickness skin. On the right he is not as bad some surface eschar and debris small areas. We have been using silver alginate under 4-layer compression. Miraculously he still has home health changing these dressings i.e. [facility] 5/12; monthly follow-up. Much better condition of the edema in his bilateral lower legs. He has home health using 4 layer compression and he states he uses his compression pumps every second day 6/12; monthly follow-up. He has decent edema control. He only has a small superficial area on the right leg a large number of small wounds on the left leg. He is not using his compression pumps. He has home health changing his dressings READMISSION 06/13/2019 Mr. kohlenberg is now a 64 year old man. He has a long history of chronic venous insufficiency with chronic stasis dermatitis and lymphedema. He was last in this clinic in June at that point he had a small superficial area on the right leg and the larger number of small wounds on the left. He has been using silver alginate and and 4-layer compression. He still has [facility] home health care coming out. He tells me he went to [facility] wound care twice. They healed him out after that he does not think he was actually healed. His wife at the time was in [place] after falling and fracturing her femur by the sound of it. She is currently in a nursing home in [place] He comes into clinic today with large areas of superficial denuded epithelium which is almost circumferential on the right and a large area on the left lateral. His edema control is marginal. He is not in any pain. Past medical history includes congestive heart failure, previous venous ablation, lymphedema and obstructive sleep apnea.  He has compression pumps at home but he has been completely noncompliant with this by his own admission. He is not a diabetic. ABIs in our clinic were 1.27 on the right and 1.25 on the left we do not have time for this this afternoon I am and I am not comfortable 10/9; the patient arrives with green drainage under the compression right greater than left. He is not complaining of any pain. He also almost circumferential epithelial loss on the right. He has home health changing the dressing. We are doing 2-week follow-ups. He lives in [place] 10/23; 2-week follow-up. The patient took his antibiotics he seems to have tolerated  this well. He has still areas on the right and left calf left more substantially. I think he has some improvement in the epithelialization. He has new wounds on the left dorsal foot today. He says he has been using compression pumps 11/6; two-week follow-up. The patient arrived still with wounds mostly on his lateral lower legs. He has a new area on the right dorsal foot today just in close proximity to his toes. His edema control is marginal. He will not use his compression pumps. He has home health changing the dressings. He tells [place] that his wife is in the hospital in [place] with heart failure. I suspect he is sitting at her bedside for most the day. His legs are probably dependent. 12/4; 1 month follow-up. He arrives with everything on his legs completely closed. He has some form of external compression garment at home as well as compression pumps. READMISSION 11/25/2019 We discharge this patient on 08/22/2019 with everything on his bilateral lower legs closed. He had an external compression stocking for both legs at home as well as compression pumps although admittedly he has never been compliant with the latter. He states his legs stay closed for about 2 or 3 weeks. He ended up in hospital at [facility] from 12/22 through 12/29 with Covid infection. He came home and is  gradually been regaining his strength. Currently has bilateral innumerable wounds almost circumferentially on both lower legs in the setting of severe venous inflammation but no current infection. The patient has diastolic heart failure hypertension history of TIA chronic venous ulcers had obstructive sleep apnea although he is not compliant with CPAP. He was never felt to have an arterial issue in this clinic his pedal pulses and ABIs have been normal most recently in September/20 at 1.25 on the right and 1.27 on the left 3/23; he arrives with much less edema in both legs. He has home health changing the dressings. Been using silver alginate. He only has an open area along the wrap line of both legs. The rest of this seems to be closed. 4/6; better edema control in our compression wraps. He has not been using his external compression pumps he tells me because his wife was hospitalized for about 10 days. We have been using silver alginate on the wounds. 4/20; he has good edema control and compression wraps. His wife is back at [facility] he says he has not been using the external compression pumps. Silver alginate to the wounds 5/4; he has good edema control bilaterally. He has not been using the compression pumps he tells [place] his wife is coming home from [place] today. He does not have any open wounds on the right leg continued large collection of small wounds on the left anterior leg extending medially into laterally nothing much posteriorly on the left. 6/1. He has a smattering of small wounds anteriorly on the left and a larger but superficial wound on the left posterior calf. There is nothing open on the right we have been using silver alginate on the left I will change that the [facility] today 6/22; there is nothing open on his right leg. He has a smattering of small eschars on the left anterior lower leg but no open wounds per se. Somebody applied the compression wrap on the right leg in a  very irregular fashion. He has a very tender area on the right medial ankle. This is probably related to stasis dermatitis but I cannot rule out cellulitis in this area  7/6; 2-week follow-up. Not surprisingly comes back in with wounds on his bilateral legs at the level of where his external compression garment was tight superiorly. He tells me he is using his pumps once every second day. 7/20; 2-week follow-up the patient has not been using his compression pumps his wife was transferred to a new nursing home. He has 1 wound on the medial left lower leg and a small area on the right lateral 8/17; patient arrives today with only a smattering of small wounds on the left anterior lower leg most of these are eschared and look benign. There is nothing on his right leg. We have been using silver alginate under 4-layer compression 8/31; patient's wounds on the left anterior lower leg are larger. There is still nothing open on the right toe although we did put compression wraps on this last time. He has home health. Says he is used his compression pumps twice in the last 3 days. Obviously not sufficient 9/14; 2-week follow-up. We discharged him in his own zippered stocking. Apparently home health helped him change this and noted weeping edema fluid therefore put him back in a wrap he arrives in the clinic today with no open wounds on the right innumerable small broken areas on the anterior left calf. Uncontrolled edema above the level of the wrap compatible with lymphedema 9/27 2-week follow-up. He arrives today with the left anterior lower extremity looking better. On the right he has a small open area posteriorly. I am going to put him back in compression on both sides. He claims compliance with his compression pumps at home twice a day he has home health changing his dressing 10/26; 1 month follow-up he has home health changing his dressings there is no open wound on the right leg he has extremitease  stockings and external compression pumps which he says he is using once a day. The left leg is always been a more difficult site and although it is difficult to identify any precise open wound he has several leaking areas especially anteriorly and superiorly and at the edge of his wraps 11/9; 2-week follow-up. He has his extremities stocking on the right. He says he has been using his compression pumps once a day. He has 1 remaining wound on the left anterior mid tibia which looks healthy his edema control is otherwise good on the left 11/30; there is now a 3-week follow-up. We use his own compression stocking on the right last time as he has no open wounds. He apparently developed a UTI received a course of ciprofloxacin. He did not wear his compression pumps and I wonder whether he actually was using his stocking. Home health put a compression wrap on his right leg last week. He has multiple small open areas on the left anterior leg this week. Objective Constitutional Sitting or standing Blood Pressure is within target range for patient.. Pulse regular and within target range for patient.Marland Kitchen Respirations regular, non-labored and within target range.. Temperature is normal and within the target range for the patient.Marland Kitchen Appears in no distress. Vitals Time Taken: 12:57 PM, Height: 70 in, Source: Stated, Weight: 360 lbs, Source: Stated, BMI: 51.6, Temperature: 98.0 F, Pulse: 76 bpm, Respiratory Rate: 22 breaths/min, Blood Pressure: 141/84 mmHg. Respiratory work of breathing is normal. Bilateral breath sounds are clear and equal in all lobes with no wheezes, rales or rhonchi.. Cardiovascular No evidence of CHF. Very poor edema control in the right greater than left leg. On the left side the  edema is massive in his left dorsal foot.. General Notes: Wound exam; left anterior leg usual amount of small clean surface wounds however he has multiple areas on the right leg which was closed last time. We do  not have good edema control in the right leg especially in the right upper anterior and lateral areas. This extends into the posterior thigh. I see no evidence of infection and I doubt this is a DVT Integumentary (Hair, Skin) Wound #65 status is Open. Original cause of wound was Gradually Appeared. The wound is located on the [facility] Lower Leg. The wound measures 10cm length x 10.5cm width x 0.1cm depth; 82.467cm^2 area and 8.247cm^3 volume. There is Fat Layer (Subcutaneous Tissue) exposed. There is no tunneling or undermining noted. There is a medium amount of serosanguineous drainage noted. The wound margin is flat and intact. There is large (67-100%) red granulation within the wound bed. There is no necrotic tissue within the wound bed. Wound #68 status is Open. Original cause of wound was Gradually Appeared. The wound is located on the Left,Proximal,Anterior Lower Leg. The wound measures 1.1cm length x 0.4cm width x 0.2cm depth; 0.346cm^2 area and 0.069cm^3 volume. There is Fat Layer (Subcutaneous Tissue) exposed. There is no tunneling or undermining noted. There is a medium amount of serosanguineous drainage noted. The wound margin is flat and intact. There is large (67-100%) red granulation within the wound bed. There is no necrotic tissue within the wound bed. Wound #72 status is Open. Original cause of wound was Gradually Appeared. The wound is located on the Right,Anterior Lower Leg. The wound measures 0.8cm length x 1.3cm width x 0.1cm depth; 0.817cm^2 area and 0.082cm^3 volume. There is Fat Layer (Subcutaneous Tissue) exposed. There is no tunneling or undermining noted. There is a medium amount of serosanguineous drainage noted. The wound margin is flat and intact. There is large (67-100%) red granulation within the wound bed. There is no necrotic tissue within the wound bed. Wound #73 status is Open. Original cause of wound was Gradually Appeared. The wound is located on the  Right,Lateral Lower Leg. The wound measures 9cm length x 11cm width x 0.1cm depth; 77.754cm^2 area and 7.775cm^3 volume. There is Fat Layer (Subcutaneous Tissue) exposed. There is no tunneling or undermining noted. There is a medium amount of serosanguineous drainage noted. The wound margin is flat and intact. There is large (67-100%) red granulation within the wound bed. There is no necrotic tissue within the wound bed. 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 There was a Four Layer Compression Therapy Procedure by [name], RN. Post procedure Diagnosis Wound #65: Same as Pre-Procedure Wound #68 Pre-procedure diagnosis of Wound #68 is a Lymphedema located on the Left,Proximal,Anterior Lower Leg . There was a Four Layer Compression Therapy Procedure by [name], RN. Post procedure Diagnosis Wound #68: Same as Pre-Procedure Wound #72 Pre-procedure diagnosis of Wound #72 is a Lymphedema located on the Right,Anterior Lower Leg . There was a Four Layer Compression Therapy Procedure by [name], RN. Post procedure Diagnosis Wound #72: Same as Pre-Procedure Wound #73 Pre-procedure diagnosis of Wound #73  is a Lymphedema located on the Right,Lateral Lower Leg . There was a Four Layer Compression Therapy Procedure by [name], RN. Post procedure Diagnosis Wound #73: Same as Pre-Procedure Plan Follow-up Appointments: Return Appointment in 1 week. Dressing Change Frequency: Wound #65 Left,Distal,Anterior Lower Leg: Other:  - twice a week Wound #68 Left,Proximal,Anterior Lower Leg: Other: - twice a week Wound #72 Right,Anterior Lower Leg: Other: - twice a week Wound #73 Right,Lateral Lower Leg: Other: - twice a week Skin Barriers/Peri-Wound Care: Moisturizing lotion TCA Cream or Ointment Wound Cleansing: May shower with protection. Primary Wound Dressing: Wound #65 Left,Distal,Anterior Lower Leg: Calcium Alginate with Silver Wound #68 Left,Proximal,Anterior Lower Leg: Calcium Alginate with Silver Wound #72 Right,Anterior Lower Leg: Calcium Alginate with Silver Wound #73 Right,Lateral Lower Leg: Calcium Alginate with Silver Secondary Dressing: Wound #65 Left,Distal,Anterior Lower Leg: Dry Gauze ABD pad Wound #68 Left,Proximal,Anterior Lower Leg: Dry Gauze ABD pad Wound #72 Right,Anterior Lower Leg: Dry Gauze ABD pad Wound #73 Right,Lateral Lower Leg: Dry Gauze ABD pad Edema Control: 4 layer compression - Bilateral Elevate legs to the level of the heart or above for 30 minutes daily and/or when sitting, a frequency of: Segmental Compressive Device. - lymphedema pumps twice a day for 1 hour each time Home Health: Wound #65 Left,Distal,Anterior Lower Leg: Continue Home Health skilled nursing for wound care. - [facility] 1. Silver alginate this time with bilateral 4-layer wraps 2. Pleaded with him to put external compression pumps on both legs 3. I will see him back next week to make sure things are stable. I saw no evidence of congestive heart failure at the bedside I do not believe the right leg has a DVT although the swelling is extensive 4. I am not sure the wraps are being put on properly in the left leg there is no reason for the degree of left foot edema to be present if we were evenly spacing the compression Electronic Signature(s) Signed: 08/17/2020 5:54:56 PM By: Linton Ham MD Entered By: Linton Ham on 08/17/2020  13:40:01 -------------------------------------------------------------------------------- SuperBill Details Patient Name: Date of Service: [name], [place] W. 08/17/2020 Medical Record Number: [id number] Patient Account Number: [id number] Date of Birth/Sex: Treating RN: 11-26-1955 (64 y.o. [name] Primary Care Provider: [name] Other Clinician: Referring Provider: Treating Provider/Extender: [name] in Treatment: 38 Diagnosis Coding ICD-10 Codes Code Description [phone] Chronic venous hypertension (idiopathic) with ulcer and inflammation of bilateral lower extremity I89.0 Lymphedema, not elsewhere classified L97.821 Non-pressure chronic ulcer of other part of left lower leg limited to breakdown of skin L97.811 Non-pressure chronic ulcer of other part of right lower leg limited to breakdown of skin L03.115 Cellulitis of right lower limb Facility Procedures CPT4: Code [id number] Description: 81 BILATERAL: Application of multi-layer venous compression system; leg (below knee), including ankle and t. Modifier: Quantity: 1 Physician Procedures Electronic Signature(s) Signed: 08/17/2020 5:54:56 PM By: Linton Ham MD Entered By: Linton Ham on 08/17/2020 13:40:25

## 2020-08-20 DIAGNOSIS — I87333 Chronic venous hypertension (idiopathic) with ulcer and inflammation of bilateral lower extremity: Secondary | ICD-10-CM | POA: Diagnosis not present

## 2020-08-20 DIAGNOSIS — I110 Hypertensive heart disease with heart failure: Secondary | ICD-10-CM | POA: Diagnosis not present

## 2020-08-20 DIAGNOSIS — E669 Obesity, unspecified: Secondary | ICD-10-CM | POA: Diagnosis not present

## 2020-08-20 DIAGNOSIS — G4733 Obstructive sleep apnea (adult) (pediatric): Secondary | ICD-10-CM | POA: Diagnosis not present

## 2020-08-20 DIAGNOSIS — I5032 Chronic diastolic (congestive) heart failure: Secondary | ICD-10-CM | POA: Diagnosis not present

## 2020-08-20 DIAGNOSIS — L97821 Non-pressure chronic ulcer of other part of left lower leg limited to breakdown of skin: Secondary | ICD-10-CM | POA: Diagnosis not present

## 2020-08-24 ENCOUNTER — Encounter (HOSPITAL_BASED_OUTPATIENT_CLINIC_OR_DEPARTMENT_OTHER): Payer: Medicare Other | Attending: Internal Medicine | Admitting: Internal Medicine

## 2020-08-24 ENCOUNTER — Other Ambulatory Visit: Payer: Self-pay

## 2020-08-24 DIAGNOSIS — I890 Lymphedema, not elsewhere classified: Secondary | ICD-10-CM | POA: Diagnosis not present

## 2020-08-24 DIAGNOSIS — L97821 Non-pressure chronic ulcer of other part of left lower leg limited to breakdown of skin: Secondary | ICD-10-CM | POA: Diagnosis not present

## 2020-08-24 DIAGNOSIS — I509 Heart failure, unspecified: Secondary | ICD-10-CM | POA: Insufficient documentation

## 2020-08-24 DIAGNOSIS — I87333 Chronic venous hypertension (idiopathic) with ulcer and inflammation of bilateral lower extremity: Secondary | ICD-10-CM | POA: Diagnosis not present

## 2020-08-24 DIAGNOSIS — Z8673 Personal history of transient ischemic attack (TIA), and cerebral infarction without residual deficits: Secondary | ICD-10-CM | POA: Insufficient documentation

## 2020-08-24 DIAGNOSIS — I872 Venous insufficiency (chronic) (peripheral): Secondary | ICD-10-CM | POA: Diagnosis not present

## 2020-08-24 DIAGNOSIS — Z9119 Patient's noncompliance with other medical treatment and regimen: Secondary | ICD-10-CM | POA: Insufficient documentation

## 2020-08-24 DIAGNOSIS — I110 Hypertensive heart disease with heart failure: Secondary | ICD-10-CM | POA: Insufficient documentation

## 2020-08-24 DIAGNOSIS — E11621 Type 2 diabetes mellitus with foot ulcer: Secondary | ICD-10-CM | POA: Diagnosis not present

## 2020-08-24 DIAGNOSIS — L03115 Cellulitis of right lower limb: Secondary | ICD-10-CM | POA: Insufficient documentation

## 2020-08-24 DIAGNOSIS — L97811 Non-pressure chronic ulcer of other part of right lower leg limited to breakdown of skin: Secondary | ICD-10-CM | POA: Insufficient documentation

## 2020-08-24 NOTE — Progress Notes (Signed)
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 the right medial ankle. 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 Not surprisingly comes back in with wounds on his bilateral legs at the level of where his external  compression garment was tight superiorly. He tells me he is using his pumps once every second day. 7/20; 2-week follow-up the patient has not been using his compression pumps his wife was transferred to a new nursing home. He has 1 wound on the medial left lower leg and a small area on the right lateral 8/17; patient arrives today with only a smattering of small wounds on the left anterior lower leg most of these are eschared and look benign. There is nothing on his right leg. We have been using silver alginate under 4-layer compression 8/31; patient's wounds on the left anterior lower leg are larger. There is still nothing open on the right toe although we did put compression wraps on this last time. He has home health. Says he is used his compression pumps twice in the last 3 days. Obviously not sufficient 9/14; 2-week follow-up. We discharged him in his own zippered stocking. Apparently home health helped him change this and noted weeping edema fluid therefore put him back in a wrap he arrives in the  clinic today with no open wounds on the right innumerable small broken areas on the anterior left calf. Uncontrolled edema above the level of the wrap compatible with lymphedema 9/27 2-week follow-up. He arrives today with the left anterior lower extremity looking better. On the right he has a small open area posteriorly. I am going to put him back in compression on both sides. He claims compliance with his compression pumps at home twice a day he has home health changing his dressing 10/26; 1 month follow-up he has home health changing his dressings there is no open wound on the right leg he has extremitease stockings and external compression pumps which he says he is using once a day. The left leg is always been a more difficult site and although it is difficult to identify any precise open wound he has several leaking areas especially anteriorly and superiorly and at the edge of his wraps 11/9; 2-week follow-up. He has his extremities stocking on the right. He says he has been using his compression pumps once a day. He has 1 remaining wound on the left anterior mid tibia which looks healthy his edema control is otherwise good on the left 11/30; there is now a 3-week follow-up. We use his own compression stocking on the right last time as he has no open wounds. He apparently developed a UTI received a course of ciprofloxacin. He did not wear his compression pumps and I wonder whether he actually was using his stocking. Home health put a compression wrap on his right leg last week. He has multiple small open areas on the left anterior leg this week. 12/7; still a cluster of small areas on the left lateral calf and the right lateral calf. He has home health changing his dressings. For some reason he will not use compression pumps reliably this week because his wife spent 2 days in the hospital with anemia Electronic Signature(s) Signed: 08/24/2020 5:06:48 PM By: Linton Ham MD Entered By: Linton Ham on 08/24/2020 14:00:31 -------------------------------------------------------------------------------- Physical Exam Details Patient Name: Date of Service: [name] [name] RDIN W. 08/24/2020 12:30 PM Medical Record Number: [id number] Patient Account Number: [id number] Date of Birth/Sex: Treating RN: 09-Jan-1956 (64 y.o. [name] [name] Primary Care Provider: [name] Other Clinician: Referring Provider: Treating Provider/Extender: [name] in Treatment: 39 Cardiovascular Pedal pulses are palpable. Psychiatric appears at normal baseline. Notes Wound exam; again bilateral lower legs mid  calf laterally smattering of small chronic and recurrent venous insufficiency ulcers. 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 His edema control is not too bad Electronic Signature(s) Signed: 08/24/2020 5:06:48 PM By: Linton Ham MD Entered By: Linton Ham on 08/24/2020 14:01:22 -------------------------------------------------------------------------------- Physician Orders Details Patient Name: Date of Service: [name] [name], [name] W. 08/24/2020 12:30 PM Medical Record Number: [id number] Patient Account Number: [id number] Date of Birth/Sex: Treating RN: 07-Dec-1955 (64 y.o. [name] [name] Primary Care Provider: [name] Other Clinician: Referring Provider: Treating Provider/Extender: [name] in Treatment: 39 Verbal / Phone Orders: No Diagnosis Coding ICD-10 Coding Code Description [phone] Chronic venous hypertension (idiopathic) with ulcer and inflammation of bilateral lower extremity I89.0 Lymphedema, not elsewhere classified L97.821 Non-pressure chronic ulcer of other part of left lower leg limited to breakdown of skin L97.811 Non-pressure chronic ulcer of other part of right lower leg limited to breakdown of skin L03.115 Cellulitis of right lower limb Follow-up Appointments Return Appointment in 2  weeks. Bathing/ Shower/ Hygiene May shower with protection but do not get wound dressing(s) wet. Edema Control - Lymphedema / SCD / Other Lymphedema Pumps. Use Lymphedema pumps on leg(s) 2-3 times a day for 45-60 minutes. If wearing any wraps or hose, do not remove them. Continue exercising as instructed. Elevate legs to the level of the heart or above for 30 minutes daily and/or when sitting, a frequency of: Avoid standing for long periods of time. Exercise regularly Home Health Wound #65 Left,Distal,Anterior Lower Leg No change in wound care orders this week; continue Home Health for wound care. May utilize formulary equivalent dressing for wound treatment orders unless otherwise specified. - AMEDYSIS Wound #68 Left,Proximal,Anterior Lower Leg No change in wound care orders this week; continue Home Health for wound care. May utilize formulary equivalent dressing for wound treatment orders unless otherwise specified. - AMEDYSIS Wound #72 Right,Anterior Lower Leg No change in wound care orders this week; continue Home Health for wound care. May utilize formulary equivalent dressing for wound treatment orders unless otherwise specified. - AMEDYSIS Wound #73 Right,Lateral Lower Leg No change in wound care orders this week; continue Home Health for wound care. May utilize formulary equivalent dressing for wound treatment orders unless otherwise specified. - [facility]IS Wound Treatment Wound #65 - Lower Leg Wound Laterality: Left, Anterior, Distal Cleanser: Normal Saline (Home Health) (Generic) 2 x Per Day/30 Days Discharge Instructions: Cleanse the wound with Normal Saline prior to applying a clean dressing using gauze sponges, not tissue or cotton balls. Peri-Wound Care: Sween Lotion (Moisturizing lotion) (Home Health) (Generic) 2 x Per Day/30 Days Discharge Instructions: Apply moisturizing lotion as directed Prim Dressing: KerraCel Ag Gelling Fiber Dressing, 4x5 in (silver alginate) (Home  Health) (Generic) 2 x Per Day/30 Days ary Discharge Instructions: Apply silver alginate to wound bed as instructed Secondary Dressing: Woven Gauze Sponge, Non-Sterile 4x4 in (Home Health) (Generic) 2 x Per Day/30 Days Discharge Instructions: Apply over primary dressing as directed. Secondary Dressing: ABD Pad, 5x9 (Home Health) 2 x Per Day/30 Days Discharge Instructions: Apply over primary dressing as directed. Compression Wrap: FourPress (4 layer compression wrap) (Home Health) (Generic) 2 x Per Day/30 Days Discharge Instructions: Apply four layer compression as directed. Wound #68 - Lower Leg Wound Laterality: Left, Anterior, Proximal Cleanser: Normal Saline (Home Health) (Generic) 2 x Per Day/30 Days Discharge Instructions: Cleanse the wound with Normal Saline prior to applying a clean dressing using gauze sponges, not tissue or cotton balls. Peri-Wound Care: Sween Lotion (Moisturizing lotion) (  Home Health) (Generic) 2 x Per Day/30 Days Discharge Instructions: Apply moisturizing lotion as directed Prim Dressing: KerraCel Ag Gelling Fiber Dressing, 4x5 in (silver alginate) (Home Health) (Generic) 2 x Per Day/30 Days ary Discharge Instructions: Apply silver alginate to wound bed as instructed Secondary Dressing: Woven Gauze Sponge, Non-Sterile 4x4 in (Home Health) (Generic) 2 x Per Day/30 Days Discharge Instructions: Apply over primary dressing as directed. Secondary Dressing: ABD Pad, 5x9 (Home Health) 2 x Per Day/30 Days Discharge Instructions: Apply over primary dressing as directed. Compression Wrap: FourPress (4 layer compression wrap) (Home Health) (Generic) 2 x Per Day/30 Days Discharge Instructions: Apply four layer compression as directed. Wound #72 - Lower Leg Wound Laterality: Right, Anterior Cleanser: Normal Saline (Home Health) (Generic) 2 x Per Day/30 Days Discharge Instructions: Cleanse the wound with Normal Saline prior to applying a clean dressing using gauze sponges, not  tissue or cotton balls. Peri-Wound Care: Sween Lotion (Moisturizing lotion) (Home Health) (Generic) 2 x Per Day/30 Days Discharge Instructions: Apply moisturizing lotion as directed Prim Dressing: KerraCel Ag Gelling Fiber Dressing, 4x5 in (silver alginate) (Home Health) (Generic) 2 x Per Day/30 Days ary Discharge Instructions: Apply silver alginate to wound bed as instructed Secondary Dressing: Woven Gauze Sponge, Non-Sterile 4x4 in (Home Health) (Generic) 2 x Per Day/30 Days Discharge Instructions: Apply over primary dressing as directed. Secondary Dressing: ABD Pad, 5x9 (Home Health) 2 x Per Day/30 Days Discharge Instructions: Apply over primary dressing as directed. Compression Wrap: FourPress (4 layer compression wrap) (Home Health) (Generic) 2 x Per Day/30 Days Discharge Instructions: Apply four layer compression as directed. Wound #73 - Lower Leg Wound Laterality: Right, Lateral Cleanser: Normal Saline (Home Health) (Generic) 2 x Per Day/30 Days Discharge Instructions: Cleanse the wound with Normal Saline prior to applying a clean dressing using gauze sponges, not tissue or cotton balls. Peri-Wound Care: Sween Lotion (Moisturizing lotion) (Home Health) (Generic) 2 x Per Day/30 Days Discharge Instructions: Apply moisturizing lotion as directed Prim Dressing: KerraCel Ag Gelling Fiber Dressing, 4x5 in (silver alginate) (Home Health) (Generic) 2 x Per Day/30 Days ary Discharge Instructions: Apply silver alginate to wound bed as instructed Secondary Dressing: Woven Gauze Sponge, Non-Sterile 4x4 in (Home Health) (Generic) 2 x Per Day/30 Days Discharge Instructions: Apply over primary dressing as directed. Secondary Dressing: ABD Pad, 5x9 (Home Health) 2 x Per Day/30 Days Discharge Instructions: Apply over primary dressing as directed. Compression Wrap: FourPress (4 layer compression wrap) (Home Health) (Generic) 2 x Per Day/30 Days Discharge Instructions: Apply four layer compression as  directed. Electronic Signature(s) Signed: 08/24/2020 5:06:48 PM By: Linton Ham MD Signed: 08/24/2020 5:28:36 PM By: [name] RN Entered By: [name] on 08/24/2020 13:46:19 -------------------------------------------------------------------------------- Problem List Details Patient Name: Date of Service: [name] [name], [name] W. 08/24/2020 12:30 PM Medical Record Number: [id number] Patient Account Number: [id number] Date of Birth/Sex: Treating RN: [birth year] (64 y.o. [name] [name] Primary Care Provider: [name] Other Clinician: Referring Provider: Treating Provider/Extender: [name] in Treatment: 39 Active Problems ICD-10 Encounter Code Description Active Date MDM Diagnosis I87.333 Chronic venous hypertension (idiopathic) with ulcer and inflammation of 11/25/2019 No Yes bilateral lower extremity I89.0 Lymphedema, not elsewhere classified 11/25/2019 No Yes L97.821 Non-pressure chronic ulcer of other part of left lower leg limited to breakdown 11/25/2019 No Yes of skin L97.811 Non-pressure chronic ulcer of other part of right lower leg limited to breakdown 11/25/2019 No Yes of skin L03.115 Cellulitis of right lower limb 03/09/2020 No Yes Inactive Problems Resolved Problems Electronic  Signature(s) Signed: 08/24/2020 5:06:48 PM By: Linton Ham MD Entered By: Linton Ham on 08/24/2020 13:58:00 -------------------------------------------------------------------------------- Progress Note Details Patient Name: Date of Service: [name] [name], [name] W. 08/24/2020 12:30 PM Medical Record Number: [id number] Patient Account Number: [id number] Date of Birth/Sex: Treating RN: 04-21-1956 (64 y.o. [name] [name] Primary Care Provider: [name] Other Clinician: Referring Provider: Treating Provider/Extender: [name] in Treatment: 52 Subjective History of Present Illness (HPI) The following HPI elements were documented for  the patient's wound: Location: both legs Quality: Patient reports No Pain. long history of chronic venous hypertension,chf,morbid obesity. s/p gsv ablation. cva 2oo4. sleep apnea andhbp. breakdown of skin both legs around 2 months ago. treated here for this in 2015. no .dm. 05/09/2016 -- he had his arterial studies done last week and his right ABI was 1.3 and his left ABI was 1.4. His right toe brachial index was 0.98 and on the left was 0.9. Venous studies have only be done today and reports are awaited. 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 Donnetta Hutching. He had a arterial study done which showed a right ABI of 1.3 which is within normal limits at rest and a left ABI of 1.4 which is within normal limits at rest and may be falsely elevated. The right toe brachial index was 0.98 and the left toe brachial index was 0.90 05/30/2016 -- seen by Dr. Althea Charon -- is known to have a prior laser ablation of his left great saphenous vein in 2009. Prior to that he had 2 ablations of the odd same vein by interventional radiology. As noted to have severe venous hypertension bilaterally. 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 His right great saphenous vein is somewhat dilated but no evidence of reflux. The only deep venous reflux demonstrated was in his left common femoral vein. After every consideration Dr. Donnetta Hutching recommended reattempt at ablation versus removal of his left great saphenous vein in the operating room with the standard vein stripping technique. The patient would consider this and let him know again. 06/20/16 patient continues to wear a juxtalite on the right leg without any open areas. On the lateral aspect of his left leg he has 4 wounds and a small area on the  medial area of the left leg. Using silver alginate under Profore 06/27/16 still no open areas on the right leg. On the lateral aspect of his left leg he has 4 wounds which continue to have a nonviable surface and wheeze been using silver alginate under Profore 07/04/2016 -- patient hasn't yet to contact his vascular surgeon regarding plans for surgical intervention and I believe he is trying his best to avoid surgery. I have again discussed with him the futility of trying to heal this and keep it healed, if he does not agree to surgical intervention 07/11/2016 -- the patient has not had any juxta light ordered for at least 3 years and we will order him a pair. 08/08/2016 -- he has been approved for Apligraf and they will get this ready for him next week 08/15/2016 -- he has his first Apligraf applied today 08/29/2016 -- he has had his second application of Apligraf today 09/12/2016 -- his Apligraf has not arrived today due to the holiday 09/19/2016 -- he has had his third application  of Apligraf today 10/03/2016 -- he has had his fourth application of Apligraf today. 10/18/2016 -- his next Apligraf has not arrived today. He has had chronic problems with his back and was to start on steroids and I have told him there are no objections against this. He is also taking appropriate medications as per his orthopedic doctor. 10/24/2016 -- he is here for his fifth application of Apligraf today. 01/09/2017 -- is been having repeated falls and problems with his back and saw a spine surgeon who has recommended holding his anticoagulation and will have some epidural injections in a few weeks. 03/13/2017 - he had been doing very well with his left lower extremity and the ulcerations that come down significantly. However last week he may have hit himself against a metal cabinet and has started having abrasions and because of this has started weeping from the right lateral calf. He has not used his compression  since morning and his right lower extremity has markedly increased and lymphedema 03/27/17; the patient appears to be doing very well only a small cluster of wounds on the right lateral lower extremity. Most of the areas on his left anterior and left posterior leg are closed the wrong way to closing. His compression slipped down today there is irritation where the wrap edge was but no evidence of infection 04/24/2017 -- the patient has been using his lymphedema pumps and is also wearing his new compression on the right lower extremity. 05/01/2017 -- he has begun using his lymphedema pumps for a longer period of time but unfortunately had a fall and may have bruised his left lateral lower extremity under the 4-layer compression wrap and has multiple ulcerations in this area today 06/05/2017 -- after examination today he is noted to have taken a significant turn for the worse with multiple open ulcerations on his left lower calf and anterior leg. Lymphedema is better controlled and there is no evidence of cellulitis. I believe the patient is not being compliant with his lymphedema pumps. 06/12/2017 -- the patient did not come for his nurse visit change to the left lower extremity on Friday, as advised. He has not been doing his compression appropriately and now has developed a ulcerated area on the right lower extremity. He also has not been using his lymphedema pumps appropriately. in addition to this the patient tells me that he and his wife are going to the beach this coming Sunday for over a week 06/26/2017 -- the patient is back after 2 weeks when he had gone on vacation and his treatment was substandard and he did not do his lymphedema pump. He does not have any systemic symptoms 08/07/2017 -- he kept his compression stockings all week and says he has been using his compression wraps on the right lower leg. He is also saying he is diligent with his lymphedema pumps. 08/21/17; using his lymphedema  pumps about twice a week. He keeps his compression wraps on the left lower leg. He has his compression stocking on the right leg 12/14/18patient continues to be noncompliant with the lymphedema pumps. He has extremitease stockings on the right leg. He has a cluster of wounds on the left leg we have been using silver alginate 09/12/2017 -- over the Christmas holidays his right leg has become extremely large with lymphedema and weeping with ulceration and this is a huge step backward. I understand he has not been compliant with his diet or his lymphedema pumps 09/19/17 on evaluation today patient appears to  be doing somewhat poorly due to the significant amount of fluid buildup in the right lower extremity especially. This has been somewhat macerated due to the fact that he is having so much drainage. No fevers, chills, nausea, or vomiting noted at this time. Patient has been tolerating the dressing changes but notes that it doesn't take very long for the weeping to build up. He has not been using his compression pumps for lymphedema unfortunately as I do feel like this will be beneficial for him. No fevers, chills, nausea, or vomiting noted at this time. Patient has no evidence of dementia that is definitely noncompliant. 09/25/17; patient arrives with a lot of swelling in the right leg. Necrotic surface to the wound on the right lateral leg extending posteriorly. A lot of drainage and the right foot with maceration of the skin on the posterior right foot. There is smattering of wounds on the left lateral leg anteriorly and laterally. The edema control here is much better. He is definitely noncompliant and tells me he uses a compression pumps at most twice a week 10/02/17; patient's major wound is on the right lateral leg extending posteriorly although this does not look worse than last week. Surface looks better. He has a small collection of wounds on the left lateral leg and anterior left lateral leg.  Edema control is better he does not use his compression pumps. He looked somewhat short of breath 10/09/17; the major wound is on the right lateral leg covered in tightly adherent necrotic debris this week. Quite a bit different from last week. He has the usual constellation of small superficial areas on the left anterior and left lateral leg. We had been using silver alginate 10/16/17; the patient's major wound on the right lateral leg has a much better surfaces weak using Iodoflex. He has a constellation of small superficial areas on the left anterior and left lateral leg which are roughly unchanged. Noncompliant with his compression pumps using them perhaps once or twice per week 10/23/17; the patient's major wound is on the right lateral anterior lateral leg. Much better surface using Iodoflex. However he has very significant edema in the right leg today. Superficial areas on the left lateral leg are roughly unchanged his edema is better here. 10/30/17; the patient's major wound is on the right lateral anterior lower leg. Not much difference today. I changed him from Iodoflex to silver alginate last week. He is not using his compression pumps. He comes back for a nurse change of his 4 layer compression ooOn the left lateral leg several areas of denuded epithelium with weeping edema fluid. ooHe reports he will not be able to come back for his nurse visit on Friday because he is traveling. We arranged for him to come back next Monday 11/06/17; the major wound on his right lateral leg actually looks some better. He still has weeping areas on the left lateral leg predominantly but most of this looks some better as well. We've been using silver alginate to all wound areas 11/13/17 uses compression pumps once last week. The major wound on the right lateral leg actually looks some better. Still weeping edema sites on the right anterior leg and most of the left leg circumferentially. We've been using silver  alginate all the usual secondary dressings under 4 layer compression 11/20/17; I don't believe he uses compression pumps at all last week. The major wound on the right however actually looks better smaller. Major problem is on the left leg where  he has a multitude of small open areas from anteriorly spreading medially around the posterior part of his calf. Paradoxically 2 or 3 weeks ago this was actually the appearance on the lateral part of the calf. His edema control is not horrible but he has significant edema weeping fluid. 11/27/17; compression pump noncompliance remains an issue. The right leg stockings seems to of falling down he has more edema in the right leg and in addition to the wound on the right lateral leg he has a new one on the right posterior leg and the right anterior lateral leg superiorly. On the left he has his usual cluster of small wounds which seems to come and go. His edema control in the right leg is not good 12/04/17-he is here for violation for bilateral lower extremity venous and lymphedema ulcers. He is tolerating compression. He is voicing no complaints or concerns. We will continue with same treatment plan and follow-up next week 12/11/17; this is a patient with chronic venous inflammation with secondary lymphedema. He tolerates compression but will not use his compression pumps. He comes in with bilateral small weeping areas on both lower extremities. These tend to move in different positions however we have never been able to heal him. 12/18/17; after considerable discussion last week the patient states he was able to use his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 Day/30 Days Discharge Instructions: Apply four layer compression as directed. WOUND #73: - Lower Leg Wound Laterality: Right, Lateral Cleanser: Normal Saline (Home Health) (Generic) 2 x Per Day/30 Days Discharge Instructions: Cleanse the wound with Normal Saline prior to applying a clean dressing using gauze sponges, not tissue or cotton balls. Peri-Wound Care: Sween Lotion (Moisturizing lotion) (Home Health) (Generic) 2 x Per Day/30 Days Discharge Instructions: Apply moisturizing lotion as directed Prim Dressing: KerraCel Ag Gelling Fiber Dressing, 4x5 in (silver alginate) (Home Health) (Generic) 2 x Per Day/30 Days ary Discharge Instructions: Apply silver alginate to wound bed as instructed Secondary Dressing: Woven Gauze Sponge, Non-Sterile 4x4 in (Home Health) (Generic) 2 x Per Day/30 Days Discharge Instructions: Apply over primary dressing as directed. Secondary Dressing: ABD Pad, 5x9 (Home Health) 2 x Per Day/30 Days Discharge Instructions: Apply over primary dressing as directed. Compression Wrap: FourPress (4 layer compression wrap) (Home Health) (Generic) 2 x Per Day/30 Days Discharge Instructions: Apply four layer compression as directed. #1 again we use silver alginate under 4-layer compression 2. The real issue here is lack of compliance with the lymphedema pumps 3. He has home health which is somewhat odd since he is not homebound nevertheless that is been a positive since we could not see him frequently  enough Electronic Signature(s) Signed: 08/24/2020 5:06:48 PM By: Linton Ham MD Entered By: Linton Ham on 08/24/2020 14:02:32 -------------------------------------------------------------------------------- SuperBill Details Patient Name: Date of Service: [name] [name], [place] W. 08/24/2020 Medical Record Number: [id number] Patient Account Number: [id number] Date of Birth/Sex: Treating RN: [birth year] (64 y.o. 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 Modifier: Quantity: 1 Physician Procedures : CPT4 Code Description Modifier [id number] - WC PHYS LEVEL 3 - EST PT ICD-10 Diagnosis Description I87.333 Chronic venous hypertension (idiopathic) with ulcer and inflammation of bilateral lower extremity L97.821 Non-pressure chronic ulcer of other  part of left lower leg limited to breakdown of skin L97.811 Non-pressure chronic ulcer of other part of right lower leg limited to breakdown of skin I89.0 Lymphedema, not elsewhere classified Quantity: 1 Electronic Signature(s) Signed: 08/24/2020 5:06:48 PM By: Linton Ham MD Entered By: Linton Ham on 08/24/2020 14:02:54

## 2020-08-26 NOTE — Progress Notes (Signed)
ABUBAKAR CRISPO ([id number]) , Visit Report for 08/24/2020 Arrival Information Details Patient Name: Date of Service: [name] 08/24/2020 12:30 PM Medical Record Number: [id number] Patient Account Number: [id number] Date of Birth/Sex: Treating RN: [birth year] (64 y.o. [name], Lauren Primary Care Damyen Knoll: [name] Other Clinician: Referring Rache Klimaszewski: Treating Gurney Balthazor/Extender: [name] in Treatment: 39 Visit Information History Since Last Visit Added or deleted any medications: No Patient Arrived: [name]: No Arrival Time: 12:42 Had a fall or experienced change in No Accompanied By: self activities of daily living that may affect Transfer Assistance: None risk of falls: Patient Identification Verified: Yes Signs or symptoms of abuse/neglect since last visito No Secondary Verification Process Completed: Yes Hospitalized since last visit: No Patient Requires Transmission-Based Precautions: No Implantable device outside of the clinic excluding No Patient Has Alerts: Yes cellular tissue based products placed in the center Patient Alerts: R ABI = 1.25 since last visit: L ABI = 1.27 Has Dressing in Place as Prescribed: Yes Has Compression in Place as Prescribed: Yes Pain Present Now: No Electronic Signature(s) Signed: 08/24/2020 4:53:31 PM By: Rhae Hammock RN Entered By: Rhae Hammock on 08/24/2020 12:43:07 -------------------------------------------------------------------------------- Complex / Palliative Patient Assessment Details Patient Name: Date of Service: [name] 08/24/2020 12:30 PM Medical Record Number: [id number] Patient Account Number: [id number] Date of Birth/Sex: Treating RN: [birth year] (64 y.o. [name] Primary Care Jaileigh Weimer: [name] Other Clinician: Referring Luisfelipe Engelstad: Treating Melesio Madara/Extender: [name] in Treatment:  39 Palliative Management Criteria Complex Wound Management Criteria Patient has remarkable or complex co-morbidities requiring medications or treatments that extend wound healing times. Examples: Diabetes mellitus with chronic renal failure or end stage renal disease requiring dialysis Advanced or poorly controlled rheumatoid arthritis Diabetes mellitus and end stage chronic obstructive pulmonary disease Active cancer with current chemo- or radiation therapy PVD, Varicose Veins, CHF, Lymphedema, Morbid Obesity Care Approach Wound Care Plan: Complex Wound Management Electronic Signature(s) Signed: 08/24/2020 5:06:48 PM By: Linton Ham MD Signed: 08/26/2020 4:05:55 PM By: Levan Hurst RN, BSN Entered By: Levan Hurst on 08/24/2020 13:54:15 -------------------------------------------------------------------------------- Compression Therapy Details Patient Name: Date of Service: [name], [place] W. 08/24/2020 12:30 PM Medical Record Number: [id number] Patient Account Number: [id number] Date of Birth/Sex: Treating RN: [birth year] (64 y.o. [name] Primary Care Dereke Neumann: [name] Other Clinician: Referring Bryce Cheever: Treating Wladyslaw Henrichs/Extender: [name] in Treatment: 39 Compression Therapy Performed for Wound Assessment: Wound #65 Left,Distal,Anterior Lower Leg Performed By: Clinician [name], RN Compression Type: Four Layer Post Procedure Diagnosis Same as Pre-procedure Electronic Signature(s) Signed: 08/24/2020 5:28:36 PM By: [name] RN Entered By: [name] on 08/24/2020 13:44:03 -------------------------------------------------------------------------------- Compression Therapy Details Patient Name: Date of Service: [name] 08/24/2020 12:30 PM Medical Record Number: [id number] Patient Account Number: [id number] Date of Birth/Sex: Treating RN: 1956/02/09 (64 y.o. [name] Primary Care Tamecca Artiga: [name] Other Clinician: Referring Ambreen Tufte: Treating Judy Pollman/Extender: [name] in Treatment: 39 Compression Therapy Performed for Wound Assessment: Wound #68 Left,Proximal,Anterior Lower Leg Performed By: Clinician [name], RN Compression Type: Four Layer Post Procedure Diagnosis Same as Pre-procedure Electronic Signature(s) Signed: 08/24/2020 5:28:36 PM By: [name] RN Entered By: [name] on 08/24/2020 13:44:03 -------------------------------------------------------------------------------- Compression Therapy Details Patient Name: Date of Service: [name] 08/24/2020 12:30 PM Medical Record Number: [id number] Patient Account Number: [id number] Date of Birth/Sex: Treating RN: December 12, 1955 (64 y.o. M)  [name] Primary Care Nesreen Albano: [name] Other Clinician: Referring Carren Blakley: Treating Aprile Dickenson/Extender: [name] in Treatment: 39 Compression Therapy Performed for Wound Assessment: Wound #72 Right,Anterior Lower Leg Performed By: Clinician [name], RN Compression Type: Four Layer Post Procedure Diagnosis Same as Pre-procedure Electronic Signature(s) Signed: 08/24/2020 5:28:36 PM By: [name] RN Entered By: [name] on 08/24/2020 13:44:04 -------------------------------------------------------------------------------- Compression Therapy Details Patient Name: Date of Service: [name] 08/24/2020 12:30 PM Medical Record Number: [id number] Patient Account Number: [id number] Date of Birth/Sex: Treating RN: 25-Dec-1955 (64 y.o. [name] Primary Care Jonae Renshaw: [name] Other Clinician: Referring Miro Balderson: Treating Laniya Friedl/Extender: [name] in Treatment: 39 Compression Therapy Performed for Wound Assessment: Wound #73 Right,Lateral Lower Leg Performed By: Clinician [name], RN Compression Type: Four Layer Post Procedure  Diagnosis Same as Pre-procedure Electronic Signature(s) Signed: 08/24/2020 5:28:36 PM By: [name] RN Entered By: [name] on 08/24/2020 13:44:04 -------------------------------------------------------------------------------- Encounter Discharge Information Details Patient Name: Date of Service: [name], [place] W. 08/24/2020 12:30 PM Medical Record Number: [id number] Patient Account Number: [id number] Date of Birth/Sex: Treating RN: 07-03-1956 (64 y.o. [name], Lauren Primary Care Chandlor Noecker: [name] Other Clinician: Referring Tiahna Cure: Treating Tarvaris Puglia/Extender: [name] in Treatment: 39 Encounter Discharge Information Items Discharge Condition: Stable Ambulatory Status: Cane Discharge Destination: Home Transportation: Private Auto Accompanied By: self Schedule Follow-up Appointment: Yes Clinical Summary of Care: Patient Declined Electronic Signature(s) Signed: 08/24/2020 4:53:31 PM By: Rhae Hammock RN Entered By: Rhae Hammock on 08/24/2020 14:02:13 -------------------------------------------------------------------------------- Lower Extremity Assessment Details Patient Name: Date of Service: [name] 08/24/2020 12:30 PM Medical Record Number: [id number] Patient Account Number: [id number] Date of Birth/Sex: Treating RN: 12-Oct-1955 (64 y.o. [name], Lauren Primary Care Mizani Dilday: [name] Other Clinician: Referring Raela Bohl: Treating Tahjae Clausing/Extender: [name] in Treatment: 39 Edema Assessment Assessed: [name]: No] [name]: No] Edema: [Left: Yes] [Right: Yes] Calf Left: Right: Point of Measurement: 30 cm From Medial Instep 44 cm 45.5 cm Ankle Left: Right: Point of Measurement: 13 cm From Medial Instep 25 cm 28 cm Vascular Assessment Pulses: Dorsalis Pedis Palpable: [Left:Yes] [Right:Yes] Posterior Tibial Palpable: [Left:Yes] [Right:Yes] Electronic  Signature(s) Signed: 08/24/2020 4:53:31 PM By: Rhae Hammock RN Entered By: Rhae Hammock on 08/24/2020 13:12:29 -------------------------------------------------------------------------------- Multi Wound Chart Details Patient Name: Date of Service: [name], [place] W. 08/24/2020 12:30 PM Medical Record Number: [id number] Patient Account Number: [id number] Date of Birth/Sex: Treating RN: 1956-07-22 (64 y.o. [name]) [name] Primary Care Jaceon Heiberger: [name] Other Clinician: Referring Fayetta Sorenson: Treating Zuly Belkin/Extender: [name] in Treatment: 39 Vital Signs Height(in): 70 Pulse(bpm): 46 Weight(lbs): 360 Blood Pressure(mmHg): 169/107 Body Mass Index(BMI): 52 Temperature(F): 98.2 Respiratory Rate(breaths/min): 19 Photos: [65:No Photos Left, Distal, Anterior Lower Leg] [68:No Photos Left, Proximal, Anterior Lower Leg] [72:No Photos Right, Anterior Lower Leg] Wound Location: [65:Gradually Appeared] [68:Gradually Appeared] [72:Gradually Appeared] Wounding Event: [65:Venous Leg Ulcer] [68:Lymphedema] [72:Lymphedema] Primary Etiology: [65:Lymphedema] [68:N/A] [72:N/A] Secondary Etiology: [65:Lymphedema, Sleep Apnea,] [68:Lymphedema, Sleep Apnea,] [72:Lymphedema, Sleep Apnea,] Comorbid History: [65:Congestive Heart Failure, Hypertension, Peripheral Venous Disease, Osteoarthritis 09/08/2019] [68:Congestive Heart Failure, Hypertension, Peripheral Venous Disease, Osteoarthritis 03/23/2020] [72:Congestive Heart Failure,  Hypertension, Peripheral Venous Disease, Osteoarthritis 08/17/2020] Date Acquired: [65:39] [68:22] [72:1] Weeks of Treatment: [65:Open] [68:Open] [72:Open] Wound Status: [65:Yes] [68:No] [72:No] Clustered Wound: [65:2] [68:N/A] [72:N/A] Clustered Quantity: [65:3x4x0.1] [68:1.5x0.5x0.1] [72:1x1x0.1] Measurements L x W x D (cm) [65:9.425] [68:0.589] [72:0.785] A (cm) : rea [65:0.942] [68:0.059] [72:0.079] Volume (cm) : [  65:98.50%]  [68:67.50%] [72:3.90%] % Reduction in Area: [65:98.50%] [68:67.40%] [72:3.70%] % Reduction in Volume: [65:Full Thickness Without Exposed] [68:Full Thickness Without Exposed] [72:Full Thickness Without Exposed] Classification: [65:Support Structures Medium] [68:Support Structures Medium] [72:Support Structures Medium] Exudate Amount: [65:Serosanguineous] [68:Serosanguineous] [72:Serosanguineous] Exudate Type: [65:red, brown] [68:red, brown] [72:red, brown] Exudate Color: [65:Flat and Intact] [68:Flat and Intact] [72:Flat and Intact] Wound Margin: [65:Large (67-100%)] [68:Large (67-100%)] [72:Large (67-100%)] Granulation Amount: [65:Red] [68:Red] [72:Red] Granulation Quality: [65:None Present (0%)] [68:None Present (0%)] [72:None Present (0%)] Necrotic Amount: [65:Fat Layer (Subcutaneous Tissue): Yes Fat Layer (Subcutaneous Tissue): Yes Fat Layer (Subcutaneous Tissue): Yes] Exposed Structures: [65:Fascia: No Tendon: No Muscle: No Joint: No Bone: No Medium (34-66%)] [68:Fascia: No Tendon: No Muscle: No Joint: No Bone: No Small (1-33%)] [72:Fascia: No Tendon: No Muscle: No Joint: No Bone: No Small (1-33%)] Epithelialization: [65:Compression Therapy] [68:Compression Therapy] [72:Compression [place] Wound Number: 79 N/A N/A Photos: No Photos N/A N/A Right, Lateral Lower Leg N/A N/A Wound Location: Gradually Appeared N/A N/A Wounding Event: Lymphedema N/A N/A Primary Etiology: N/A N/A N/A Secondary Etiology: Lymphedema, Sleep Apnea, N/A N/A Comorbid History: Congestive Heart Failure, Hypertension, Peripheral Venous Disease, Osteoarthritis 08/03/2020 N/A N/A Date Acquired: 1 N/A N/A Weeks of Treatment: Open N/A N/A Wound Status: No N/A N/A Clustered Wound: N/A N/A N/A Clustered Quantity: 2x2.4x0.1 N/A N/A Measurements L x W x D (cm) 3.77 N/A N/A A (cm) : rea 0.377 N/A N/A Volume (cm) : 95.20% N/A N/A % Reduction in Area: 95.20% N/A N/A % Reduction in Volume: Full  Thickness Without Exposed N/A N/A Classification: Support Structures Medium N/A N/A Exudate Amount: Serosanguineous N/A N/A Exudate Type: red, brown N/A N/A Exudate Color: Flat and Intact N/A N/A Wound Margin: Large (67-100%) N/A N/A Granulation Amount: Red N/A N/A Granulation Quality: None Present (0%) N/A N/A Necrotic Amount: Fat Layer (Subcutaneous Tissue): Yes N/A N/A Exposed Structures: Fascia: No Tendon: No Muscle: No Joint: No Bone: No Medium (34-66%) N/A N/A Epithelialization: Compression Therapy N/A N/A Procedures Performed: Treatment Notes Electronic Signature(s) Signed: 08/24/2020 5:06:48 PM By: Linton Ham MD Signed: 08/24/2020 5:28:36 PM By: [name] RN Entered By: Linton Ham on 08/24/2020 13:58:08 -------------------------------------------------------------------------------- Multi-Disciplinary Care Plan Details Patient Name: Date of Service: [name], [place] W. 08/24/2020 12:30 PM Medical Record Number: [id number] Patient Account Number: [id number] Date of Birth/Sex: Treating RN: 12-Oct-1955 (64 y.o. [name] Primary Care Lyllian Gause: [name] Other Clinician: Referring Nasiya Pascual: Treating Jaskirat Schwieger/Extender: [name] in Treatment: 39 Active Inactive Wound/Skin Impairment Nursing Diagnoses: Knowledge deficit related to ulceration/compromised skin integrity Goals: Patient/caregiver will verbalize understanding of skin care regimen Date Initiated: 11/25/2019 Target Resolution Date: 09/20/2020 Goal Status: Active Ulcer/skin breakdown will have a volume reduction of 30% by week 4 Date Initiated: 11/25/2019 Date Inactivated: 01/06/2020 Target Resolution Date: 12/26/2019 Goal Status: Met Ulcer/skin breakdown will have a volume reduction of 50% by week 8 Date Initiated: 01/06/2020 Date Inactivated: 02/17/2020 Target Resolution Date: 02/05/2020 Goal Status: Met Interventions: Assess patient/caregiver ability to  obtain necessary supplies Assess patient/caregiver ability to perform ulcer/skin care regimen upon admission and as needed Assess ulceration(s) every visit Notes: Electronic Signature(s) Signed: 08/24/2020 5:28:36 PM By: [name] RN Entered By: [name] on 08/24/2020 12:42:21 -------------------------------------------------------------------------------- Pain Assessment Details Patient Name: Date of Service: [name] [place] W. 08/24/2020 12:30 PM Medical Record Number: [id number] Patient Account Number: [id number] Date of Birth/Sex: Treating RN: July 14, 1956 (64 y.o. [name], Lauren Primary Care Jakeisha Stricker: [name] Other Clinician: Referring Joneric Streight: Treating Anija Brickner/Extender: Duke Salvia  Weeks in Treatment: 39 Active Problems Location of Pain Severity and Description of Pain Patient Has Paino No Site Locations Rate the pain. Rate the pain. Current Pain Level: 0 Pain Management and Medication Current Pain Management: Electronic Signature(s) Signed: 08/24/2020 4:53:31 PM By: Rhae Hammock RN Entered By: Rhae Hammock on 08/24/2020 13:12:01 -------------------------------------------------------------------------------- Patient/Caregiver Education Details Patient Name: Date of Service: [name] 12/7/2021andnbsp12:30 PM Medical Record Number: [id number] Patient Account Number: [id number] Date of Birth/Gender: Treating RN: January 06, 1956 (64 y.o. [name] Primary Care Physician: [name] Other Clinician: Referring Physician: Treating Physician/Extender: [name] in Treatment: 39 Education Assessment Education Provided To: Patient Education Topics Provided Wound/Skin Impairment: Methods: Explain/Verbal Responses: State content correctly Electronic Signature(s) Signed: 08/24/2020 5:28:36 PM By: [name] RN Entered By: [name] on 08/24/2020  12:42:39 -------------------------------------------------------------------------------- Wound Assessment Details Patient Name: Date of Service: [name] 08/24/2020 12:30 PM Medical Record Number: [id number] Patient Account Number: [id number] Date of Birth/Sex: Treating RN: June 15, 1956 (64 y.o. [name], Lauren Primary Care Roddrick Sharron: [name] Other Clinician: Referring Caliann Leckrone: Treating Ermagene Saidi/Extender: [name] in Treatment: 39 Wound Status Wound Number: 65 Primary Venous Leg Ulcer Etiology: Wound Location: Left, Distal, Anterior Lower Leg Secondary Lymphedema Wounding Event: Gradually Appeared Etiology: Date Acquired: 09/08/2019 Wound Open Weeks Of Treatment: 39 Status: Clustered Wound: Yes Comorbid Lymphedema, Sleep Apnea, Congestive Heart Failure, History: Hypertension, Peripheral Venous Disease, Osteoarthritis Wound Measurements Length: (cm) 3 Width: (cm) 4 Depth: (cm) 0. Clustered Quantity: 2 Area: (cm) 9 Volume: (cm) 0 % Reduction in Area: 98.5% % Reduction in Volume: 98.5% 1 Epithelialization: Medium (34-66%) Tunneling: No .425 Undermining: No .942 Wound Description Classification: Full Thickness Without Exposed Support Structures Wound Margin: Flat and Intact Exudate Amount: Medium Exudate Type: Serosanguineous Exudate Color: red, brown Foul Odor After Cleansing: No Slough/Fibrino No Wound Bed Granulation Amount: Large (67-100%) Exposed Structure Granulation Quality: Red Fascia Exposed: No Necrotic Amount: None Present (0%) Fat Layer (Subcutaneous Tissue) Exposed: Yes Tendon Exposed: No Muscle Exposed: No Joint Exposed: No Bone Exposed: No Treatment Notes Wound #65 (Lower Leg) Wound Laterality: Left, Anterior, Distal Cleanser Normal Saline Discharge Instruction: Cleanse the wound with Normal Saline prior to applying a clean dressing using gauze sponges, not tissue or cotton balls. Peri-Wound  Care Sween Lotion (Moisturizing lotion) Discharge Instruction: Apply moisturizing lotion as directed Topical Primary Dressing KerraCel Ag Gelling Fiber Dressing, 4x5 in (silver alginate) Discharge Instruction: Apply silver alginate to wound bed as instructed Secondary Dressing Woven Gauze Sponge, Non-Sterile 4x4 in Discharge Instruction: Apply over primary dressing as directed. ABD Pad, 5x9 Discharge Instruction: Apply over primary dressing as directed. Secured With Compression Wrap FourPress (4 layer compression wrap) Discharge Instruction: Apply four layer compression as directed. Compression Stockings Add-Ons Electronic Signature(s) Signed: 08/24/2020 4:53:31 PM By: Rhae Hammock RN Entered By: Rhae Hammock on 08/24/2020 13:14:02 -------------------------------------------------------------------------------- Wound Assessment Details Patient Name: Date of Service: [name], [name] 08/24/2020 12:30 PM Medical Record Number: [id number] Patient Account Number: [id number] Date of Birth/Sex: Treating RN: 1955/11/01 (64 y.o. [name], Lauren Primary Care Oanh Devivo: [name] Other Clinician: Referring Dajanae Brophy: Treating Keiran Gaffey/Extender: [name] in Treatment: 39 Wound Status Wound Number: 68 Primary Lymphedema Etiology: Wound Location: Left, Proximal, Anterior Lower Leg Wound Open Wounding Event: Gradually Appeared Status: Date Acquired: 03/23/2020 Comorbid Lymphedema, Sleep Apnea, Congestive Heart Failure, Weeks Of Treatment: 22 History: Hypertension, Peripheral Venous Disease, Osteoarthritis Clustered Wound: No Wound Measurements Length: (cm) 1.5 Width: (cm) 0.5  Depth: (cm) 0.1 Area: (cm) 0.589 Volume: (cm) 0.059 % Reduction in Area: 67.5% % Reduction in Volume: 67.4% Epithelialization: Small (1-33%) Tunneling: No Undermining: No Wound Description Classification: Full Thickness Without Exposed Support  Structures Wound Margin: Flat and Intact Exudate Amount: Medium Exudate Type: Serosanguineous Exudate Color: red, brown Foul Odor After Cleansing: No Slough/Fibrino No Wound Bed Granulation Amount: Large (67-100%) Exposed Structure Granulation Quality: Red Fascia Exposed: No Necrotic Amount: None Present (0%) Fat Layer (Subcutaneous Tissue) Exposed: Yes Tendon Exposed: No Muscle Exposed: No Joint Exposed: No Bone Exposed: No Treatment Notes Wound #68 (Lower Leg) Wound Laterality: Left, Anterior, Proximal Cleanser Normal Saline Discharge Instruction: Cleanse the wound with Normal Saline prior to applying a clean dressing using gauze sponges, not tissue or cotton balls. Peri-Wound Care Sween Lotion (Moisturizing lotion) Discharge Instruction: Apply moisturizing lotion as directed Topical Primary Dressing KerraCel Ag Gelling Fiber Dressing, 4x5 in (silver alginate) Discharge Instruction: Apply silver alginate to wound bed as instructed Secondary Dressing Woven Gauze Sponge, Non-Sterile 4x4 in Discharge Instruction: Apply over primary dressing as directed. ABD Pad, 5x9 Discharge Instruction: Apply over primary dressing as directed. Secured With Compression Wrap FourPress (4 layer compression wrap) Discharge Instruction: Apply four layer compression as directed. Compression Stockings Add-Ons Electronic Signature(s) Signed: 08/24/2020 4:53:31 PM By: Rhae Hammock RN Entered By: Rhae Hammock on 08/24/2020 13:14:26 -------------------------------------------------------------------------------- Wound Assessment Details Patient Name: Date of Service: [name], [place] W. 08/24/2020 12:30 PM Medical Record Number: [id number] Patient Account Number: [id number] Date of Birth/Sex: Treating RN: 12-Apr-1956 (64 y.o. [name], Lauren Primary Care Morgin Halls: [name] Other Clinician: Referring Juri Dinning: Treating Tremond Shimabukuro/Extender: [name]  in Treatment: 39 Wound Status Wound Number: 72 Primary Lymphedema Etiology: Wound Location: Right, Anterior Lower Leg Wound Open Wounding Event: Gradually Appeared Status: Date Acquired: 08/17/2020 Comorbid Lymphedema, Sleep Apnea, Congestive Heart Failure, Weeks Of Treatment: 1 History: Hypertension, Peripheral Venous Disease, Osteoarthritis Clustered Wound: No Wound Measurements Length: (cm) 1 Width: (cm) 1 Depth: (cm) 0.1 Area: (cm) 0.785 Volume: (cm) 0.079 % Reduction in Area: 3.9% % Reduction in Volume: 3.7% Epithelialization: Small (1-33%) Tunneling: No Undermining: No Wound Description Classification: Full Thickness Without Exposed Support Structures Wound Margin: Flat and Intact Exudate Amount: Medium Exudate Type: Serosanguineous Exudate Color: red, brown Foul Odor After Cleansing: No Slough/Fibrino No Wound Bed Granulation Amount: Large (67-100%) Exposed Structure Granulation Quality: Red Fascia Exposed: No Necrotic Amount: None Present (0%) Fat Layer (Subcutaneous Tissue) Exposed: Yes Tendon Exposed: No Muscle Exposed: No Joint Exposed: No Bone Exposed: No Treatment Notes Wound #72 (Lower Leg) Wound Laterality: Right, Anterior Cleanser Normal Saline Discharge Instruction: Cleanse the wound with Normal Saline prior to applying a clean dressing using gauze sponges, not tissue or cotton balls. Peri-Wound Care Sween Lotion (Moisturizing lotion) Discharge Instruction: Apply moisturizing lotion as directed Topical Primary Dressing KerraCel Ag Gelling Fiber Dressing, 4x5 in (silver alginate) Discharge Instruction: Apply silver alginate to wound bed as instructed Secondary Dressing Woven Gauze Sponge, Non-Sterile 4x4 in Discharge Instruction: Apply over primary dressing as directed. ABD Pad, 5x9 Discharge Instruction: Apply over primary dressing as directed. Secured With Compression Wrap FourPress (4 layer compression wrap) Discharge Instruction:  Apply four layer compression as directed. Compression Stockings Add-Ons Electronic Signature(s) Signed: 08/24/2020 4:53:31 PM By: Rhae Hammock RN Entered By: Rhae Hammock on 08/24/2020 13:14:54 -------------------------------------------------------------------------------- Wound Assessment Details Patient Name: Date of Service: [name], [place] W. 08/24/2020 12:30 PM Medical Record Number: [id number] Patient Account Number: [id number] Date of Birth/Sex: Treating RN: 08-30-1956 (64 y.o.  [name], Lauren Primary Care Chance Munter: [name] Other Clinician: Referring Deveon Kisiel: Treating Desarea Ohagan/Extender: [name] in Treatment: 39 Wound Status Wound Number: 73 Primary Lymphedema Etiology: Wound Location: Right, Lateral Lower Leg Wound Open Wounding Event: Gradually Appeared Status: Date Acquired: 08/03/2020 Comorbid Lymphedema, Sleep Apnea, Congestive Heart Failure, Weeks Of Treatment: 1 History: Hypertension, Peripheral Venous Disease, Osteoarthritis Clustered Wound: No Wound Measurements Length: (cm) 2 Width: (cm) 2.4 Depth: (cm) 0.1 Area: (cm) 3.77 Volume: (cm) 0.377 % Reduction in Area: 95.2% % Reduction in Volume: 95.2% Epithelialization: Medium (34-66%) Tunneling: No Undermining: No Wound Description Classification: Full Thickness Without Exposed Support Structures Wound Margin: Flat and Intact Exudate Amount: Medium Exudate Type: Serosanguineous Exudate Color: red, brown Foul Odor After Cleansing: No Slough/Fibrino No Wound Bed Granulation Amount: Large (67-100%) Exposed Structure Granulation Quality: Red Fascia Exposed: No Necrotic Amount: None Present (0%) Fat Layer (Subcutaneous Tissue) Exposed: Yes Tendon Exposed: No Muscle Exposed: No Joint Exposed: No Bone Exposed: No Treatment Notes Wound #73 (Lower Leg) Wound Laterality: Right, Lateral Cleanser Normal Saline Discharge Instruction: Cleanse the wound  with Normal Saline prior to applying a clean dressing using gauze sponges, not tissue or cotton balls. Peri-Wound Care Sween Lotion (Moisturizing lotion) Discharge Instruction: Apply moisturizing lotion as directed Topical Primary Dressing KerraCel Ag Gelling Fiber Dressing, 4x5 in (silver alginate) Discharge Instruction: Apply silver alginate to wound bed as instructed Secondary Dressing Woven Gauze Sponge, Non-Sterile 4x4 in Discharge Instruction: Apply over primary dressing as directed. ABD Pad, 5x9 Discharge Instruction: Apply over primary dressing as directed. Secured With Compression Wrap FourPress (4 layer compression wrap) Discharge Instruction: Apply four layer compression as directed. Compression Stockings Add-Ons Electronic Signature(s) Signed: 08/24/2020 4:53:31 PM By: Rhae Hammock RN Entered By: Rhae Hammock on 08/24/2020 13:15:30 -------------------------------------------------------------------------------- Vitals Details Patient Name: Date of Service: [name], [place] W. 08/24/2020 12:30 PM Medical Record Number: [id number] Patient Account Number: [id number] Date of Birth/Sex: Treating RN: 1955-11-16 (64 y.o. [name], Lauren Primary Care Lachrisha Ziebarth: [name] Other Clinician: Referring Adisynn Suleiman: Treating Dainelle Hun/Extender: [name] in Treatment: 39 Vital Signs Time Taken: 13:10 Temperature (F): 98.2 Height (in): 70 Pulse (bpm): 64 Weight (lbs): 360 Respiratory Rate (breaths/min): 19 Body Mass Index (BMI): 51.6 Blood Pressure (mmHg): 160/98 Reference Range: 80 - 120 mg / dl Electronic Signature(s) Signed: 08/24/2020 4:53:31 PM By: Rhae Hammock RN Entered By: Rhae Hammock on 08/24/2020 14:05:17

## 2020-08-27 DIAGNOSIS — I5032 Chronic diastolic (congestive) heart failure: Secondary | ICD-10-CM | POA: Diagnosis not present

## 2020-08-27 DIAGNOSIS — I87333 Chronic venous hypertension (idiopathic) with ulcer and inflammation of bilateral lower extremity: Secondary | ICD-10-CM | POA: Diagnosis not present

## 2020-08-27 DIAGNOSIS — G4733 Obstructive sleep apnea (adult) (pediatric): Secondary | ICD-10-CM | POA: Diagnosis not present

## 2020-08-27 DIAGNOSIS — E669 Obesity, unspecified: Secondary | ICD-10-CM | POA: Diagnosis not present

## 2020-08-27 DIAGNOSIS — L97821 Non-pressure chronic ulcer of other part of left lower leg limited to breakdown of skin: Secondary | ICD-10-CM | POA: Diagnosis not present

## 2020-08-27 DIAGNOSIS — I110 Hypertensive heart disease with heart failure: Secondary | ICD-10-CM | POA: Diagnosis not present

## 2020-09-07 ENCOUNTER — Other Ambulatory Visit: Payer: Self-pay

## 2020-09-07 ENCOUNTER — Encounter (HOSPITAL_BASED_OUTPATIENT_CLINIC_OR_DEPARTMENT_OTHER): Payer: Medicare Other | Admitting: Internal Medicine

## 2020-09-07 DIAGNOSIS — L97821 Non-pressure chronic ulcer of other part of left lower leg limited to breakdown of skin: Secondary | ICD-10-CM | POA: Diagnosis not present

## 2020-09-07 DIAGNOSIS — L97811 Non-pressure chronic ulcer of other part of right lower leg limited to breakdown of skin: Secondary | ICD-10-CM | POA: Diagnosis not present

## 2020-09-07 DIAGNOSIS — I890 Lymphedema, not elsewhere classified: Secondary | ICD-10-CM | POA: Diagnosis not present

## 2020-09-07 DIAGNOSIS — E11621 Type 2 diabetes mellitus with foot ulcer: Secondary | ICD-10-CM | POA: Diagnosis not present

## 2020-09-07 DIAGNOSIS — I87333 Chronic venous hypertension (idiopathic) with ulcer and inflammation of bilateral lower extremity: Secondary | ICD-10-CM | POA: Diagnosis not present

## 2020-09-07 DIAGNOSIS — L03115 Cellulitis of right lower limb: Secondary | ICD-10-CM | POA: Diagnosis not present

## 2020-09-08 NOTE — Progress Notes (Signed)
[name] [name] ([id number]) , Visit Report for 09/07/2020 Arrival Information Details Patient Name: Date of Service: [name] Ro RDIN W. 09/07/2020 12:30 PM Medical Record Number: [id number] Patient Account Number: [id number] Date of Birth/Sex: Treating [name]: [name] [name] (64 y.o. [name] [name], Meta.Reding Primary Care [name] [name]: [name] [name] Other Clinician: Referring [name] [name]: Treating [name] [name]/Extender: [name] [name] in Treatment: [name] Visit Information History Since Last Visit Added or deleted any medications: No Patient Arrived: Kasandra [name] Any new allergies or adverse reactions: No Arrival Time: 13:10 Had a fall or experienced change in No Accompanied By: self activities of daily living that may affect Transfer Assistance: None risk of falls: Patient Identification Verified: Yes Signs or symptoms of abuse/neglect since last visito No Secondary Verification Process Completed: Yes Hospitalized since last visit: No Patient Requires Transmission-Based Precautions: No Implantable device outside of the clinic excluding No Patient Has Alerts: Yes cellular tissue based products placed in the center Patient Alerts: R ABI = 1.25 since last visit: L ABI = 1.27 Has Dressing in Place as Prescribed: Yes Has Compression in Place as Prescribed: Yes Pain Present Now: No Electronic Signature(s) Signed: 09/07/2020 5:55:39 PM By: [name] [name] Entered By: [name] [name] on 09/07/2020 13:14:53 -------------------------------------------------------------------------------- Compression Therapy Details Patient Name: Date of Service: [name] Ro RDIN W. 09/07/2020 12:30 PM Medical Record Number: [id number] Patient Account Number: [id number] Date of Birth/Sex: Treating [name]: [birth year] (64 y.o. [name] [name], [name] [name] Primary Care [name] [name]: [name] [name] Other Clinician: Referring [name] [name]: Treating [name] [name] in Treatment: [name] Compression Therapy  Performed for Wound Assessment: NonWound Condition Lymphedema - Bilateral Leg Performed By: Clinician [name] Hammock, [name] Compression Type: Four Layer Post Procedure Diagnosis Same as Pre-procedure Electronic Signature(s) Signed: 09/08/2020 5:16:00 PM By: [name] [name] Entered By: [name] [name] on 09/07/2020 13:37:40 -------------------------------------------------------------------------------- Lower Extremity Assessment Details Patient Name: Date of Service: [name] Ro RDIN W. 09/07/2020 12:30 PM Medical Record Number: [id number] Patient Account Number: [id number] Date of Birth/Sex: Treating [name]: [name] [name] (64 y.o. [name] [name] Primary Care [name] [name]: [name] [name] Other Clinician: Referring Travas Schexnayder: Treating [name] [name] in Treatment: [name] Edema Assessment Assessed: [name] [name]: Yes] [name] [name]: Yes] Edema: [Left: Yes] [Right: Yes] Calf Left: Right: Point of Measurement: 30 cm From Medial Instep 43 cm [name] cm Ankle Left: Right: Point of Measurement: 13 cm From Medial Instep [name] cm 27 cm Vascular Assessment Pulses: Dorsalis Pedis Palpable: [Left:Yes] [Right:Yes] Electronic Signature(s) Signed: 09/07/2020 5:55:39 PM By: [name] [name] Entered By: [name] [name] on 09/07/2020 13:15:53 -------------------------------------------------------------------------------- Multi Wound Chart Details Patient Name: Date of Service: [name] [name], [name] [name] RDIN W. 09/07/2020 12:30 PM Medical Record Number: [id number] Patient Account Number: [id number] Date of Birth/Sex: Treating [name]: [birth year] (64 y.o. [name] [name], [name] [name] Primary Care Noela Brothers: [name] [name] Other Clinician: Referring Danahi Reddish: Treating Elizet Kaplan/Extender: [name] [name] in Treatment: [name] Vital Signs Height(in): 70 Pulse(bpm): 59 Weight(lbs): 360 Blood Pressure(mmHg): 185/93 Body Mass Index(BMI): 52 Temperature(F): 98 Respiratory Rate(breaths/min):  18 Photos: [65:No Photos Left, Distal, Anterior Lower Leg] [68:No Photos Left, Proximal, Anterior Lower Leg] [72:No Photos Right, Anterior Lower Leg] Wound Location: [65:Gradually Appeared] [68:Gradually Appeared] [72:Gradually Appeared] Wounding Event: [65:Venous Leg Ulcer] [68:Lymphedema] [72:Lymphedema] Primary Etiology: [65:Lymphedema] [68:N/A] [72:N/A] Secondary Etiology: [65:Lymphedema, Sleep Apnea,] [68:Lymphedema, Sleep Apnea,] [72:Lymphedema, Sleep Apnea,] Comorbid History: [65:Congestive Heart Failure, Hypertension, Peripheral Venous Disease, Osteoarthritis 09/08/2019] [68:Congestive Heart Failure, Hypertension, Peripheral Venous Disease, Osteoarthritis 03/23/2020] [72:Congestive Heart Failure,  Hypertension, Peripheral Venous Disease, Osteoarthritis 08/17/2020] Date Acquired: [65:[name]] [68:24] [72:3] Weeks of Treatment: [65:Open] [68:Open] [72:Open]  Wound Status: [65:Yes] [68:No] [72:No] Clustered Wound: [65:1] [68:N/A] [72:N/A] Clustered Quantity: [65:0.9x0.5x0.1] [68:3x7x0.1] [72:0.4x0.4x0.1] Measurements L x W x D (cm) [65:0.353] [68:16.493] [72:0.126] A (cm) : rea [65:0.035] [68:1.649] [72:0.013] Volume (cm) : [65:99.90%] [68:-809.20%] [72:84.60%] % Reduction in Area: [65:99.90%] [68:-811.00%] [72:84.10%] % Reduction in Volume: [65:Full Thickness Without Exposed] [68:Full Thickness Without Exposed] [72:Full Thickness Without Exposed] Classification: [65:Support Structures Medium] [68:Support Structures Medium] [72:Support Structures Medium] Exudate Amount: [65:Serosanguineous] [68:Serosanguineous] [72:Serosanguineous] Exudate Type: [65:red, brown] [68:red, brown] [72:red, brown] Exudate Color: [65:Flat and Intact] [68:Flat and Intact] [72:Flat and Intact] Wound Margin: [65:Large (67-100%)] [68:Large (67-100%)] [72:Large (67-100%)] Granulation Amount: [65:Red] [68:Red] [72:Red] Granulation Quality: [65:None Present (0%)] [68:None Present (0%)] [72:None Present (0%)] Necrotic  Amount: [65:Fat Layer (Subcutaneous Tissue): Yes Fat Layer (Subcutaneous Tissue): Yes Fat Layer (Subcutaneous Tissue): Yes] Exposed Structures: [65:Fascia: No Tendon: No Muscle: No Joint: No Bone: No Large (67-100%)] [68:Fascia: No Tendon: No Muscle: No Joint: No Bone: No Medium (34-66%)] [72:Fascia: No Tendon: No Muscle: No Joint: No Bone: No Large (67-100%)] Wound Number: 30 N/A N/A Photos: No Photos N/A N/A Right, Lateral Lower Leg N/A N/A Wound Location: Gradually Appeared N/A N/A Wounding Event: Lymphedema N/A N/A Primary Etiology: N/A N/A N/A Secondary Etiology: Lymphedema, Sleep Apnea, N/A N/A Comorbid History: Congestive Heart Failure, Hypertension, Peripheral Venous Disease, Osteoarthritis 08/03/2020 N/A N/A Date Acquired: 3 N/A N/A Weeks of Treatment: Open N/A N/A Wound Status: No N/A N/A Clustered Wound: N/A N/A N/A Clustered Quantity: 0.5x0.4x0.1 N/A N/A Measurements L x W x D (cm) 0.157 N/A N/A A (cm) : rea 0.016 N/A N/A Volume (cm) : 99.80% N/A N/A % Reduction in Area: 99.80% N/A N/A % Reduction in Volume: Full Thickness Without Exposed N/A N/A Classification: Support Structures Medium N/A N/A Exudate Amount: Serosanguineous N/A N/A Exudate Type: red, brown N/A N/A Exudate Color: Flat and Intact N/A N/A Wound Margin: Large (67-100%) N/A N/A Granulation Amount: Red N/A N/A Granulation Quality: None Present (0%) N/A N/A Necrotic Amount: Fat Layer (Subcutaneous Tissue): Yes N/A N/A Exposed Structures: Fascia: No Tendon: No Muscle: No Joint: No Bone: No Large (67-100%) N/A N/A Epithelialization: Treatment Notes Electronic Signature(s) Signed: 09/07/2020 5:57:42 PM By: Linton Ham MD Signed: 09/08/2020 5:16:00 PM By: [name] [name] Entered By: Linton Ham on 09/07/2020 13:59:47 -------------------------------------------------------------------------------- Multi-Disciplinary Care Plan Details Patient Name: Date of  Service: [name] [name], [name] [name] RDIN W. 09/07/2020 12:30 PM Medical Record Number: [id number] Patient Account Number: [id number] Date of Birth/Sex: Treating [name]: [name] [name] (64 y.o. [name] [name], [name] [name] Primary Care Yony Roulston: [name] [name] Other Clinician: Referring Raef Sprigg: Treating Khalfani Weideman/Extender: [name] [name] in Treatment: 53 Active Inactive Wound/Skin Impairment Nursing Diagnoses: Knowledge deficit related to ulceration/compromised skin integrity Goals: Patient/caregiver will verbalize understanding of skin care regimen Date Initiated: 11/25/2019 Target Resolution Date: 09/20/2020 Goal Status: Active Ulcer/skin breakdown will have a volume reduction of 30% by week 4 Date Initiated: 11/25/2019 Date Inactivated: 01/06/2020 Target Resolution Date: 12/26/2019 Goal Status: Met Ulcer/skin breakdown will have a volume reduction of 50% by week 8 Date Initiated: 01/06/2020 Date Inactivated: 02/17/2020 Target Resolution Date: 02/05/2020 Goal Status: Met Interventions: Assess patient/caregiver ability to obtain necessary supplies Assess patient/caregiver ability to perform ulcer/skin care regimen upon admission and as needed Assess ulceration(s) every visit Notes: Electronic Signature(s) Signed: 09/08/2020 5:16:00 PM By: [name] [name] Entered By: [name] [name] on 09/07/2020 13:39:56 -------------------------------------------------------------------------------- Pain Assessment Details Patient Name: Date of Service: [name] Ro RDIN W. 09/07/2020 12:30 PM Medical Record Number: [id number] Patient Account Number: [id number] Date of Birth/Sex: Treating [name]:  [name] [name], [name] [name] (64 y.o. [name] [name] Primary Care Lawren Sexson: [name] [name] Other Clinician: Referring Ivone Licht: Treating Olukemi Panchal/Extender: [name] [name] in Treatment: [name] Active Problems Location of Pain Severity and Description of Pain Patient Has Paino No Site Locations Rate the  pain. Rate the pain. Current Pain Level: 0 Worst Pain Level: 10 Least Pain Level: 0 Tolerable Pain Level: 8 Pain Management and Medication Current Pain Management: Medication: No Cold Application: No Rest: No Massage: No Activity: No T.E.N.S.: No Heat Application: No Leg drop or elevation: No Is the Current Pain Management Adequate: Adequate How does your wound impact your activities of daily livingo Sleep: No Bathing: No Appetite: No Relationship With Others: No Bladder Continence: No Emotions: No Bowel Continence: No Work: No Toileting: No Drive: No Dressing: No Hobbies: No Electronic Signature(s) Signed: 09/07/2020 5:55:39 PM By: [name] [name] Entered By: [name] [name] on 09/07/2020 13:15:29 -------------------------------------------------------------------------------- Patient/Caregiver Education Details Patient Name: Date of Service: [name]. 12/21/2021andnbsp12:30 PM Medical Record Number: [id number] Patient Account Number: [id number] Date of Birth/Gender: Treating [name]: [birth year] [name] [name] (64 y.o. [name] [name] Primary Care Physician: [name] [name] Other Clinician: Referring Physician: Treating Physician/Extender: [name] [name] in Treatment: 45 Education Assessment Education Provided To: Patient Education Topics Provided Wound/Skin Impairment: Methods: Explain/Verbal Responses: State content correctly [employer]) Signed: 09/08/2020 5:16:00 PM By: [name] [name] Entered By: [name] [name] on 09/07/2020 13:40:13 -------------------------------------------------------------------------------- Wound Assessment Details Patient Name: Date of Service: [name] [name], [name] [name] RDIN W. 09/07/2020 12:30 PM Medical Record Number: [id number] Patient Account Number: [id number] Date of Birth/Sex: Treating [name]: [birth year] [name] [name] (64 y.o. [name] [name] Primary Care Kenyada Dosch: [name] [name] Other Clinician: Referring  Reonna Finlayson: Treating Kenny Stern/Extender: [name] [name] in Treatment: [name] Wound Status Wound Number: 65 Primary Venous Leg Ulcer Etiology: Wound Location: Left, Distal, Anterior Lower Leg Secondary Lymphedema Wounding Event: Gradually Appeared Etiology: Date Acquired: 09/08/2019 Wound Open Weeks Of Treatment: [name] Status: Clustered Wound: Yes Comorbid Lymphedema, Sleep Apnea, Congestive Heart Failure, History: Hypertension, Peripheral Venous Disease, Osteoarthritis Wound Measurements Length: (cm) Width: (cm) Depth: (cm) Clustered Quantity: Area: (cm) Volume: (cm) 0.9 % Reduction in Area: 99.9% 0.5 % Reduction in Volume: 99.9% 0.1 Epithelialization: Large (67-100%) 1 Tunneling: No 0.353 Undermining: No 0.035 Wound Description Classification: Full Thickness Without Exposed Support Structures Wound Margin: Flat and Intact Exudate Amount: Medium Exudate Type: Serosanguineous Exudate Color: red, brown Foul Odor After Cleansing: No Slough/Fibrino No Wound Bed Granulation Amount: Large (67-100%) Exposed Structure Granulation Quality: Red Fascia Exposed: No Necrotic Amount: None Present (0%) Fat Layer (Subcutaneous Tissue) Exposed: Yes Tendon Exposed: No Muscle Exposed: No Joint Exposed: No Bone Exposed: No Electronic Signature(s) Signed: 09/07/2020 5:55:39 PM By: [name] [name] Entered By: [name] [name] on 09/07/2020 13:17:46 -------------------------------------------------------------------------------- Wound Assessment Details Patient Name: Date of Service: [name] Ro RDIN W. 09/07/2020 12:30 PM Medical Record Number: [id number] Patient Account Number: [id number] Date of Birth/Sex: Treating [name]: [name] [name] (64 y.o. [name] [name] Primary Care Belinda Schlichting: [name] [name] Other Clinician: Referring Maizey Menendez: Treating Alayne Estrella/Extender: [name] [name] in Treatment: [name] Wound Status Wound Number: 68 Primary  Lymphedema Etiology: Wound Location: Left, Proximal, Anterior Lower Leg Wound Open Wounding Event: Gradually Appeared Status: Status: Date Acquired: 03/23/2020 Comorbid Lymphedema, Sleep Apnea, Congestive Heart Failure, Weeks Of Treatment: 24 History: Hypertension, Peripheral Venous Disease, Osteoarthritis Clustered Wound: No Wound Measurements Length: (cm) 3 Width: (cm) 7 Depth: (cm) 0.1 Area: (cm) 16.493 Volume: (cm) 1.649 % Reduction in Area: -809.2% % Reduction in Volume: -811% Epithelialization: Medium (34-66%)  Tunneling: No Undermining: No Wound Description Classification: Full Thickness Without Exposed Support Structures Wound Margin: Flat and Intact Exudate Amount: Medium Exudate Type: Serosanguineous Exudate Color: red, brown Foul Odor After Cleansing: No Slough/Fibrino No Wound Bed Granulation Amount: Large (67-100%) Exposed Structure Granulation Quality: Red Fascia Exposed: No Necrotic Amount: None Present (0%) Fat Layer (Subcutaneous Tissue) Exposed: Yes Tendon Exposed: No Muscle Exposed: No Joint Exposed: No Bone Exposed: No Electronic Signature(s) Signed: 09/07/2020 5:55:39 PM By: [name] [name] Entered By: [name] [name] on 09/07/2020 13:18:20 -------------------------------------------------------------------------------- Wound Assessment Details Patient Name: Date of Service: [name] Ro RDIN W. 09/07/2020 12:30 PM Medical Record Number: [id number] Patient Account Number: [id number] Date of Birth/Sex: Treating [name]: [name] [name] (64 y.o. [name] [name] Primary Care Annya Lizana: [name] [name] Other Clinician: Referring Aviv Lengacher: Treating Michaelia Beilfuss/Extender: [name] [name] in Treatment: [name] Wound Status Wound Number: 72 Primary Lymphedema Etiology: Wound Location: Right, Anterior Lower Leg Wound Open Wounding Event: Gradually Appeared Status: Date Acquired: 08/17/2020 Comorbid Lymphedema, Sleep Apnea, Congestive Heart  Failure, Weeks Of Treatment: 3 History: Hypertension, Peripheral Venous Disease, Osteoarthritis Clustered Wound: No Wound Measurements Length: (cm) 0.4 Width: (cm) 0.4 Depth: (cm) 0.1 Area: (cm) 0.126 Volume: (cm) 0.013 % Reduction in Area: 84.6% % Reduction in Volume: 84.1% Epithelialization: Large (67-100%) Tunneling: No Undermining: No Wound Description Classification: Full Thickness Without Exposed Support Structures Wound Margin: Flat and Intact Exudate Amount: Medium Exudate Type: Serosanguineous Exudate Color: red, brown Foul Odor After Cleansing: No Slough/Fibrino No Wound Bed Granulation Amount: Large (67-100%) Exposed Structure Granulation Quality: Red Fascia Exposed: No Necrotic Amount: None Present (0%) Fat Layer (Subcutaneous Tissue) Exposed: Yes Tendon Exposed: No Muscle Exposed: No Joint Exposed: No Bone Exposed: No Electronic Signature(s) Signed: 09/07/2020 5:55:39 PM By: [name] [name] Entered By: [name] [name] on 09/07/2020 13:16:46 -------------------------------------------------------------------------------- Wound Assessment Details Patient Name: Date of Service: [name] Ro RDIN W. 09/07/2020 12:30 PM Medical Record Number: [id number] Patient Account Number: [id number] Date of Birth/Sex: Treating [name]: [name] [name] (64 y.o. [name] [name] Primary Care Charod Slawinski: [name] [name] Other Clinician: Referring Jeston Junkins: Treating Kennisha Qin/Extender: [name] [name] in Treatment: [name] Wound Status Wound Number: 73 Primary Lymphedema Etiology: Wound Location: Right, Lateral Lower Leg Wound Open Wounding Event: Gradually Appeared Status: Date Acquired: 08/03/2020 Comorbid Lymphedema, Sleep Apnea, Congestive Heart Failure, Weeks Of Treatment: 3 History: Hypertension, Peripheral Venous Disease, Osteoarthritis Clustered Wound: No Wound Measurements Length: (cm) 0.5 Width: (cm) 0.4 Depth: (cm) 0.1 Area: (cm) 0.157 Volume: (cm)  0.016 % Reduction in Area: 99.8% % Reduction in Volume: 99.8% Epithelialization: Large (67-100%) Tunneling: No Undermining: No Wound Description Classification: Full Thickness Without Exposed Support Structures Wound Margin: Flat and Intact Exudate Amount: Medium Exudate Type: Serosanguineous Exudate Color: red, brown Foul Odor After Cleansing: No Slough/Fibrino No Wound Bed Granulation Amount: Large (67-100%) Exposed Structure Granulation Quality: Red Fascia Exposed: No Necrotic Amount: None Present (0%) Fat Layer (Subcutaneous Tissue) Exposed: Yes Tendon Exposed: No Muscle Exposed: No Joint Exposed: No Bone Exposed: No Electronic Signature(s) Signed: 09/07/2020 5:55:39 PM By: [name] [name] Entered By: [name] [name] on 09/07/2020 13:17:17 -------------------------------------------------------------------------------- Vitals Details Patient Name: Date of Service: [name] [name], [name] [name] RDIN W. 09/07/2020 12:30 PM Medical Record Number: [id number] Patient Account Number: [id number] Date of Birth/Sex: Treating [name]: [name] [name][birth year] ([name] y.o. [name] [name] Primary Care Baptiste Littler: [name] [name] Other Clinician: Referring Loise Esguerra: Treating Ilissa Rosner/Extender: [name] [name] in Treatment: [name] Vital Signs Time Taken: 13:10 Temperature (F): 98 Height (in): 70 Pulse (bpm): 53 Weight (lbs): 360 Respiratory Rate (breaths/min): 18 Body  Mass Index (BMI): 51.6 Blood Pressure (mmHg): 185/93 Reference Range: 80 - 120 mg / dl Electronic Signature(s) Signed: 09/07/2020 5:55:39 PM By: [name] [name] Entered By: [name] [name] on 09/07/2020 13:15:12

## 2020-09-08 NOTE — Progress Notes (Signed)
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 ankle. 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 Not surprisingly comes back in with wounds on his bilateral legs at the level of where his external  compression garment was tight superiorly. He tells me he is using his pumps once every second day. 7/20; 2-week follow-up the patient has not been using his compression pumps his wife was transferred to a new nursing home. He has 1 wound on the medial left lower leg and a small area on the right lateral 8/17; patient arrives today with only a smattering of small wounds on the left anterior lower leg most of these are eschared and look benign. There is nothing on his right leg. We have been using silver alginate under 4-layer compression 8/31; patient's wounds on the left anterior lower leg are larger. There is still nothing open on the right toe although we did put compression wraps on this last time. He has home health. Says he is used his compression pumps twice in the last 3 days. Obviously not sufficient 9/14; 2-week follow-up. We discharged him in his own zippered stocking. Apparently home health helped him change this and noted weeping edema fluid therefore put him back in a  wrap he arrives in the clinic today with no open wounds on the right innumerable small broken areas on the anterior left calf. Uncontrolled edema above the level of the wrap compatible with lymphedema 9/27 2-week follow-up. He arrives today with the left anterior lower extremity looking better. On the right he has a small open area posteriorly. I am going to put him back in compression on both sides. He claims compliance with his compression pumps at home twice a day he has home health changing his dressing 10/26; 1 month follow-up he has home health changing his dressings there is no open wound on the right leg he has extremitease stockings and external compression pumps which he says he is using once a day. The left leg is always been a more difficult site and although it is difficult to identify any precise open wound he has several leaking areas especially anteriorly and superiorly and at the edge of his wraps 11/9; 2-week follow-up. He has his extremities stocking on the right. He says he has been using his compression pumps once a day. He has 1 remaining wound on the left anterior mid tibia which looks healthy his edema control is otherwise good on the left 11/30; there is now a 3-week follow-up. We use his own compression stocking on the right last time as he has no open wounds. He apparently developed a UTI received a course of ciprofloxacin. He did not wear his compression pumps and I wonder whether he actually was using his stocking. Home health put a compression wrap on his right leg last week. He has multiple small open areas on the left anterior leg this week. 12/7; still a cluster of small areas on the left lateral calf and the right lateral calf. He has home health changing his dressings. For some reason he will not use compression pumps reliably this week because his wife spent 2 days in the hospital with anemia 12/21; he continues to have a cluster of small areas predominantly on the  left lateral but also the left anterior lower leg. More defined wounds on the right anterior and right medial. The he says he is using the compression pumps every other day. I have never understood the issue for this noncompliant. He still has home health coming out Electronic Signature(s) Signed: 09/07/2020 5:57:42 PM By: Linton Ham MD Entered By: Linton Ham on 09/07/2020 14:00:33 -------------------------------------------------------------------------------- Physical Exam Details Patient Name: Date of Service: [name], HA [place]  W. 09/07/2020 12:30 PM Medical Record Number: [id number] Patient Account Number: [id number] Date of Birth/Sex: Treating RN: 07-30-1956 (64 y.o. [name] Primary Care Provider: [name] Other Clinician: Referring Provider: Treating Provider/Extender: [name] in Treatment: 36 Constitutional Patient is hypertensive.. Pulse regular and within target range for patient.Marland Kitchen Respirations regular, non-labored and within target range.. Temperature is normal and within the target range for the patient.Marland Kitchen Appears in no distress. Cardiovascular Pedal pulses are palpable. Notes Wound exam; bilateral left greater than right lower legs. Severe chronic venous insufficiency with secondary secondary lymphedema. He has no signs of systemic fluid volume overload Electronic Signature(s) Signed: 09/07/2020 5:57:42 PM By: Linton Ham MD Entered By: Linton Ham on 09/07/2020 14:01:30 -------------------------------------------------------------------------------- Physician Orders Details Patient Name: Date of Service: [name], HA [place] W. 09/07/2020 12:30 PM Medical Record Number: [id number] Patient Account Number: [id number] Date of Birth/Sex: Treating RN: [birth year] (64 y.o. [name] Primary Care Provider: [name] Other Clinician: Referring Provider: Treating Provider/Extender: [name] in Treatment: 30 Verbal / Phone Orders: No Diagnosis Coding Follow-up Appointments Return Appointment in 2 weeks. Bathing/ Shower/ Hygiene May shower with protection but do not get wound dressing(s) wet. Edema Control - Lymphedema / SCD / Other Lymphedema Pumps. Use Lymphedema pumps on leg(s) 2-3 times a day for 45-60 minutes. If wearing any wraps or hose, do not remove them. Continue exercising as instructed. Elevate legs to the level of the heart or above for 30 minutes daily and/or when sitting, a frequency of: Avoid standing for long periods of time. Exercise regularly Home Health Wound #65 Left,Distal,Anterior Lower Leg No change in wound care orders this week; continue Home Health for wound care. May utilize formulary equivalent dressing for wound treatment orders unless otherwise specified. - AMEDYSIS Wound #68 Left,Proximal,Anterior Lower Leg No change in wound care orders this week; continue Home Health for wound care. May utilize formulary equivalent dressing for wound treatment orders unless otherwise specified. - AMEDYSIS Wound #72 Right,Anterior Lower Leg No change in wound care orders this week; continue Home Health for wound care. May utilize formulary equivalent dressing for wound treatment orders unless otherwise specified. - AMEDYSIS Wound #73 Right,Lateral Lower Leg No change in wound care orders this week; continue Home Health for wound care. May utilize formulary equivalent dressing for wound treatment orders unless otherwise specified. - AMEDYSIS Wound Treatment Wound #65 - Lower Leg Wound Laterality: Left, Anterior, Distal Cleanser: Normal Saline (Home Health) (Generic) 2 x Per Day/30 Days Discharge Instructions: Cleanse the wound with Normal Saline prior to applying a clean dressing using gauze sponges, not tissue or cotton balls. Peri-Wound Care: Sween Lotion (Moisturizing lotion) (Home Health) (Generic) 2 x Per Day/30 Days Discharge Instructions:  Apply moisturizing lotion as directed Prim Dressing: KerraCel Ag Gelling Fiber Dressing, 4x5 in (silver alginate) (Home Health) (Generic) 2 x Per Day/30 Days ary Discharge Instructions: Apply silver alginate to wound bed as instructed Secondary Dressing: Woven Gauze Sponge, Non-Sterile 4x4 in (Home Health) (Generic) 2 x Per Day/30 Days Discharge Instructions: Apply over primary dressing as directed. Secondary Dressing: ABD Pad, 5x9 (Home Health) 2 x Per Day/30 Days Discharge Instructions: Apply over primary dressing as directed. Compression Wrap: FourPress (4 layer compression wrap) (Home Health) (Generic) 2 x Per Day/30 Days Discharge Instructions: Apply four layer compression as directed. Wound #68 - Lower Leg Wound Laterality: Left, Anterior, Proximal Cleanser: Normal Saline (Home Health) (Generic) 2 x Per Day/30 Days Discharge Instructions: Cleanse the wound with Normal  Saline prior to applying a clean dressing using gauze sponges, not tissue or cotton balls. Peri-Wound Care: Sween Lotion (Moisturizing lotion) (Home Health) (Generic) 2 x Per Day/30 Days Discharge Instructions: Apply moisturizing lotion as directed Prim Dressing: KerraCel Ag Gelling Fiber Dressing, 4x5 in (silver alginate) (Home Health) (Generic) 2 x Per Day/30 Days ary Discharge Instructions: Apply silver alginate to wound bed as instructed Secondary Dressing: Woven Gauze Sponge, Non-Sterile 4x4 in (Home Health) (Generic) 2 x Per Day/30 Days Discharge Instructions: Apply over primary dressing as directed. Secondary Dressing: ABD Pad, 5x9 (Home Health) 2 x Per Day/30 Days Discharge Instructions: Apply over primary dressing as directed. Compression Wrap: FourPress (4 layer compression wrap) (Home Health) (Generic) 2 x Per Day/30 Days Discharge Instructions: Apply four layer compression as directed. Wound #72 - Lower Leg Wound Laterality: Right, Anterior Cleanser: Normal Saline (Home Health) (Generic) 2 x Per Day/30  Days Discharge Instructions: Cleanse the wound with Normal Saline prior to applying a clean dressing using gauze sponges, not tissue or cotton balls. Peri-Wound Care: Sween Lotion (Moisturizing lotion) (Home Health) (Generic) 2 x Per Day/30 Days Discharge Instructions: Apply moisturizing lotion as directed Prim Dressing: KerraCel Ag Gelling Fiber Dressing, 4x5 in (silver alginate) (Home Health) (Generic) 2 x Per Day/30 Days ary Discharge Instructions: Apply silver alginate to wound bed as instructed Secondary Dressing: Woven Gauze Sponge, Non-Sterile 4x4 in (Home Health) (Generic) 2 x Per Day/30 Days Discharge Instructions: Apply over primary dressing as directed. Secondary Dressing: ABD Pad, 5x9 (Home Health) 2 x Per Day/30 Days Discharge Instructions: Apply over primary dressing as directed. Compression Wrap: FourPress (4 layer compression wrap) (Home Health) (Generic) 2 x Per Day/30 Days Discharge Instructions: Apply four layer compression as directed. Wound #73 - Lower Leg Wound Laterality: Right, Lateral Cleanser: Normal Saline (Home Health) (Generic) 2 x Per Day/30 Days Discharge Instructions: Cleanse the wound with Normal Saline prior to applying a clean dressing using gauze sponges, not tissue or cotton balls. Peri-Wound Care: Sween Lotion (Moisturizing lotion) (Home Health) (Generic) 2 x Per Day/30 Days Discharge Instructions: Apply moisturizing lotion as directed Prim Dressing: KerraCel Ag Gelling Fiber Dressing, 4x5 in (silver alginate) (Home Health) (Generic) 2 x Per Day/30 Days ary Discharge Instructions: Apply silver alginate to wound bed as instructed Secondary Dressing: Woven Gauze Sponge, Non-Sterile 4x4 in (Home Health) (Generic) 2 x Per Day/30 Days Discharge Instructions: Apply over primary dressing as directed. Secondary Dressing: ABD Pad, 5x9 (Home Health) 2 x Per Day/30 Days Discharge Instructions: Apply over primary dressing as directed. Compression Wrap: FourPress (4  layer compression wrap) (Home Health) (Generic) 2 x Per Day/30 Days Discharge Instructions: Apply four layer compression as directed. Electronic Signature(s) Signed: 09/07/2020 5:57:42 PM By: Linton Ham MD Signed: 09/08/2020 5:16:00 PM By: Rhae Hammock RN Entered By: Rhae Hammock on 09/07/2020 13:39:45 -------------------------------------------------------------------------------- Problem List Details Patient Name: Date of Service: [name], [name] 09/07/2020 12:30 PM Medical Record Number: [id number] Patient Account Number: [id number] Date of Birth/Sex: Treating RN: [birth year] (64 y.o. 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 09/07/2020 12:30 PM Medical Record Number: [id number] Patient Account Number: [id number] Date of Birth/Sex: Treating RN: 02/08/1956 (64 y.o. [name], Lauren Primary Care Provider: [name] Other Clinician: Referring Provider: Treating Provider/Extender:  [name] in Treatment: 41 Subjective History of Present Illness (HPI) The following HPI elements were documented for the patient's wound: Location: both legs Quality: Patient reports No Pain. long history of chronic venous hypertension,chf,morbid obesity. s/p gsv ablation. cva 2oo4. sleep apnea andhbp. breakdown of skin both legs around 2 months ago. treated here for this in 2015. no .dm. 05/09/2016 -- he had his arterial studies done last week and his right ABI was 1.3 and his left ABI was 1.4. His right toe brachial index was 0.98 and on the left was 0.9. Venous studies have only be done today and reports are awaited. 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 Donnetta Hutching. He had a arterial study done which showed a right ABI of 1.3 which is within normal limits at rest and a left ABI of 1.4 which is within normal limits at rest and may be falsely elevated. The right toe brachial index was 0.98 and the left toe brachial index was 0.90 05/30/2016 -- seen by Dr. Althea Charon -- is known to have a prior laser ablation of his left great saphenous vein in 2009. Prior to that he had 2 ablations of the odd same vein by interventional radiology. As noted to have severe venous hypertension bilaterally. 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 His right great saphenous vein is somewhat dilated but no evidence of reflux. The only deep venous reflux demonstrated was in his left common femoral vein. After every consideration Dr. Donnetta Hutching recommended reattempt at ablation versus removal of his left great saphenous vein in the operating room with the standard vein stripping technique. The patient would consider this and let him know again. 06/20/16 patient  continues to wear a juxtalite on the right leg without any open areas. On the lateral aspect of his left leg he has 4 wounds and a small area on the medial area of the left leg. Using silver alginate under Profore 06/27/16 still no open areas on the right leg. On the lateral aspect of his left leg he has 4 wounds which continue to have a nonviable surface and wheeze been using silver alginate under Profore 07/04/2016 -- patient hasn't yet to contact his vascular surgeon regarding plans for surgical intervention and I believe he is trying his best to avoid surgery. I have again discussed with him the futility of trying to heal this and keep it healed, if he does not agree to surgical intervention 07/11/2016 -- the patient has not had any juxta light ordered for at least 3 years and we will order him a pair. 08/08/2016 -- he has been approved for Apligraf and they will get this ready for him next week 08/15/2016 -- he has his first Apligraf applied today 08/29/2016 -- he has had his second application of Apligraf  today 09/12/2016 -- his Apligraf has not arrived today due to the holiday 09/19/2016 -- he has had his third application of Apligraf today 10/03/2016 -- he has had his fourth application of Apligraf today. 10/18/2016 -- his next Apligraf has not arrived today. He has had chronic problems with his back and was to start on steroids and I have told him there are no objections against this. He is also taking appropriate medications as per his orthopedic doctor. 10/24/2016 -- he is here for his fifth application of Apligraf today. 01/09/2017 -- is been having repeated falls and problems with his back and saw a spine surgeon who has recommended holding his anticoagulation and will have some epidural injections in a few weeks. 03/13/2017 - he had been doing very well with his left lower extremity and the ulcerations that come down significantly. However last week he may have hit himself against a  metal cabinet and has started having abrasions and because of this has started weeping from the right lateral calf. He has not used his compression since morning and his right lower extremity has markedly increased and lymphedema 03/27/17; the patient appears to be doing very well only a small cluster of wounds on the right lateral lower extremity. Most of the areas on his left anterior and left posterior leg are closed the wrong way to closing. His compression slipped down today there is irritation where the wrap edge was but no evidence of infection 04/24/2017 -- the patient has been using his lymphedema pumps and is also wearing his new compression on the right lower extremity. 05/01/2017 -- he has begun using his lymphedema pumps for a longer period of time but unfortunately had a fall and may have bruised his left lateral lower extremity under the 4-layer compression wrap and has multiple ulcerations in this area today 06/05/2017 -- after examination today he is noted to have taken a significant turn for the worse with multiple open ulcerations on his left lower calf and anterior leg. Lymphedema is better controlled and there is no evidence of cellulitis. I believe the patient is not being compliant with his lymphedema pumps. 06/12/2017 -- the patient did not come for his nurse visit change to the left lower extremity on Friday, as advised. He has not been doing his compression appropriately and now has developed a ulcerated area on the right lower extremity. He also has not been using his lymphedema pumps appropriately. in addition to this the patient tells me that he and his wife are going to the beach this coming Sunday for over a week 06/26/2017 -- the patient is back after 2 weeks when he had gone on vacation and his treatment was substandard and he did not do his lymphedema pump. He does not have any systemic symptoms 08/07/2017 -- he kept his compression stockings all week and says he has  been using his compression wraps on the right lower leg. He is also saying he is diligent with his lymphedema pumps. 08/21/17; using his lymphedema pumps about twice a week. He keeps his compression wraps on the left lower leg. He has his compression stocking on the right leg 12/14/18patient continues to be noncompliant with the lymphedema pumps. He has extremitease stockings on the right leg. He has a cluster of wounds on the left leg we have been using silver alginate 09/12/2017 -- over the Christmas holidays his right leg has become extremely large with lymphedema and weeping with ulceration and this is a huge step backward.  I understand he has not been compliant with his diet or his lymphedema pumps 09/19/17 on evaluation today patient appears to be doing somewhat poorly due to the significant amount of fluid buildup in the right lower extremity especially. This has been somewhat macerated due to the fact that he is having so much drainage. No fevers, chills, nausea, or vomiting noted at this time. Patient has been tolerating the dressing changes but notes that it doesn't take very long for the weeping to build up. He has not been using his compression pumps for lymphedema unfortunately as I do feel like this will be beneficial for him. No fevers, chills, nausea, or vomiting noted at this time. Patient has no evidence of dementia that is definitely noncompliant. 09/25/17; patient arrives with a lot of swelling in the right leg. Necrotic surface to the wound on the right lateral leg extending posteriorly. A lot of drainage and the right foot with maceration of the skin on the posterior right foot. There is smattering of wounds on the left lateral leg anteriorly and laterally. The edema control here is much better. He is definitely noncompliant and tells me he uses a compression pumps at most twice a week 10/02/17; patient's major wound is on the right lateral leg extending posteriorly although this does  not look worse than last week. Surface looks better. He has a small collection of wounds on the left lateral leg and anterior left lateral leg. Edema control is better he does not use his compression pumps. He looked somewhat short of breath 10/09/17; the major wound is on the right lateral leg covered in tightly adherent necrotic debris this week. Quite a bit different from last week. He has the usual constellation of small superficial areas on the left anterior and left lateral leg. We had been using silver alginate 10/16/17; the patient's major wound on the right lateral leg has a much better surfaces weak using Iodoflex. He has a constellation of small superficial areas on the left anterior and left lateral leg which are roughly unchanged. Noncompliant with his compression pumps using them perhaps once or twice per week 10/23/17; the patient's major wound is on the right lateral anterior lateral leg. Much better surface using Iodoflex. However he has very significant edema in the right leg today. Superficial areas on the left lateral leg are roughly unchanged his edema is better here. 10/30/17; the patient's major wound is on the right lateral anterior lower leg. Not much difference today. I changed him from Iodoflex to silver alginate last week. He is not using his compression pumps. He comes back for a nurse change of his 4 layer compression ooOn the left lateral leg several areas of denuded epithelium with weeping edema fluid. ooHe reports he will not be able to come back for his nurse visit on Friday because he is traveling. We arranged for him to come back next Monday 11/06/17; the major wound on his right lateral leg actually looks some better. He still has weeping areas on the left lateral leg predominantly but most of this looks some better as well. We've been using silver alginate to all wound areas 11/13/17 uses compression pumps once last week. The major wound on the right lateral leg  actually looks some better. Still weeping edema sites on the right anterior leg and most of the left leg circumferentially. We've been using silver alginate all the usual secondary dressings under 4 layer compression 11/20/17; I don't believe he uses compression pumps at all  last week. The major wound on the right however actually looks better smaller. Major problem is on the left leg where he has a multitude of small open areas from anteriorly spreading medially around the posterior part of his calf. Paradoxically 2 or 3 weeks ago this was actually the appearance on the lateral part of the calf. His edema control is not horrible but he has significant edema weeping fluid. 11/27/17; compression pump noncompliance remains an issue. The right leg stockings seems to of falling down he has more edema in the right leg and in addition to the wound on the right lateral leg he has a new one on the right posterior leg and the right anterior lateral leg superiorly. On the left he has his usual cluster of small wounds which seems to come and go. His edema control in the right leg is not good 12/04/17-he is here for violation for bilateral lower extremity venous and lymphedema ulcers. He is tolerating compression. He is voicing no complaints or concerns. We will continue with same treatment plan and follow-up next week 12/11/17; this is a patient with chronic venous inflammation with secondary lymphedema. He tolerates compression but will not use his compression pumps. He comes in with bilateral small weeping areas on both lower extremities. These tend to move in different positions however we have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 Days Discharge Instructions: Cleanse the wound with Normal Saline prior to applying a clean dressing using gauze sponges, not tissue or cotton balls. Peri-Wound Care: Sween Lotion (Moisturizing lotion) (Home Health) (Generic) 2 x Per Day/30 Days Discharge Instructions: Apply moisturizing lotion as directed Prim Dressing: KerraCel Ag Gelling Fiber Dressing, 4x5 in (silver alginate) (Home Health) (Generic) 2 x Per Day/30 Days ary Discharge Instructions: Apply silver alginate to wound bed as instructed Secondary Dressing: Woven Gauze Sponge, Non-Sterile 4x4 in (Home Health) (Generic) 2 x Per Day/30 Days Discharge Instructions: Apply over primary dressing as directed. Secondary Dressing: ABD Pad, 5x9 (Home Health) 2 x Per Day/30 Days Discharge Instructions: Apply over primary dressing as directed. Com pression Wrap: FourPress (4 layer compression wrap) (Home Health) (Generic) 2 x Per Day/30 Days Discharge Instructions: Apply four layer compression as directed. #1 we are still using silver alginate under 4-layer compression. 2. The issue here is the noncompliance with regular lymphedema pump usage. As long as he has ability to come to this clinic and he has home health he not much incentive were twice a day pumping even though I think he could largely manage this Electronic Signature(s) Signed: 09/07/2020 5:57:42 PM By: Linton Ham MD Entered By: Linton Ham on 09/07/2020  14:02:47 -------------------------------------------------------------------------------- SuperBill Details Patient Name: Date of Service: [name], [place]. 09/07/2020 Medical Record Number: [id number] Patient Account Number: [id number] Date of Birth/Sex: Treating RN: 15-Mar-1956 (64 y.o. 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 Modifier: Quantity: 1 Physician Procedures : CPT4 Code Description Modifier [id number] - WC PHYS LEVEL 3 - EST PT ICD-10 Diagnosis Description L97.821 Non-pressure chronic ulcer of other part of left lower leg limited to breakdown of skin L97.811 Non-pressure chronic ulcer of other part of  right lower leg limited to breakdown of skin I87.333 Chronic venous hypertension (idiopathic) with ulcer and inflammation of bilateral lower extremity I89.0 Lymphedema, not elsewhere classified Quantity: 1 Electronic Signature(s) Signed: 09/07/2020 5:57:42 PM By: Linton Ham MD Entered By: Linton Ham on 09/07/2020 14:03:12

## 2020-09-10 DIAGNOSIS — I110 Hypertensive heart disease with heart failure: Secondary | ICD-10-CM | POA: Diagnosis not present

## 2020-09-10 DIAGNOSIS — I87333 Chronic venous hypertension (idiopathic) with ulcer and inflammation of bilateral lower extremity: Secondary | ICD-10-CM | POA: Diagnosis not present

## 2020-09-10 DIAGNOSIS — L97821 Non-pressure chronic ulcer of other part of left lower leg limited to breakdown of skin: Secondary | ICD-10-CM | POA: Diagnosis not present

## 2020-09-10 DIAGNOSIS — I5032 Chronic diastolic (congestive) heart failure: Secondary | ICD-10-CM | POA: Diagnosis not present

## 2020-09-10 DIAGNOSIS — G4733 Obstructive sleep apnea (adult) (pediatric): Secondary | ICD-10-CM | POA: Diagnosis not present

## 2020-09-10 DIAGNOSIS — E669 Obesity, unspecified: Secondary | ICD-10-CM | POA: Diagnosis not present

## 2020-09-14 DIAGNOSIS — G4733 Obstructive sleep apnea (adult) (pediatric): Secondary | ICD-10-CM | POA: Diagnosis not present

## 2020-09-14 DIAGNOSIS — E669 Obesity, unspecified: Secondary | ICD-10-CM | POA: Diagnosis not present

## 2020-09-14 DIAGNOSIS — I87333 Chronic venous hypertension (idiopathic) with ulcer and inflammation of bilateral lower extremity: Secondary | ICD-10-CM | POA: Diagnosis not present

## 2020-09-14 DIAGNOSIS — L97821 Non-pressure chronic ulcer of other part of left lower leg limited to breakdown of skin: Secondary | ICD-10-CM | POA: Diagnosis not present

## 2020-09-14 DIAGNOSIS — I5032 Chronic diastolic (congestive) heart failure: Secondary | ICD-10-CM | POA: Diagnosis not present

## 2020-09-14 DIAGNOSIS — Z9981 Dependence on supplemental oxygen: Secondary | ICD-10-CM | POA: Diagnosis not present

## 2020-09-14 DIAGNOSIS — Z8673 Personal history of transient ischemic attack (TIA), and cerebral infarction without residual deficits: Secondary | ICD-10-CM | POA: Diagnosis not present

## 2020-09-14 DIAGNOSIS — I110 Hypertensive heart disease with heart failure: Secondary | ICD-10-CM | POA: Diagnosis not present

## 2020-09-17 DIAGNOSIS — I5033 Acute on chronic diastolic (congestive) heart failure: Secondary | ICD-10-CM | POA: Diagnosis not present

## 2020-09-17 DIAGNOSIS — E1121 Type 2 diabetes mellitus with diabetic nephropathy: Secondary | ICD-10-CM | POA: Diagnosis not present

## 2020-09-17 DIAGNOSIS — L97821 Non-pressure chronic ulcer of other part of left lower leg limited to breakdown of skin: Secondary | ICD-10-CM | POA: Diagnosis not present

## 2020-09-17 DIAGNOSIS — I5032 Chronic diastolic (congestive) heart failure: Secondary | ICD-10-CM | POA: Diagnosis not present

## 2020-09-17 DIAGNOSIS — E669 Obesity, unspecified: Secondary | ICD-10-CM | POA: Diagnosis not present

## 2020-09-17 DIAGNOSIS — G4733 Obstructive sleep apnea (adult) (pediatric): Secondary | ICD-10-CM | POA: Diagnosis not present

## 2020-09-17 DIAGNOSIS — N200 Calculus of kidney: Secondary | ICD-10-CM | POA: Diagnosis not present

## 2020-09-17 DIAGNOSIS — I110 Hypertensive heart disease with heart failure: Secondary | ICD-10-CM | POA: Diagnosis not present

## 2020-09-17 DIAGNOSIS — I87333 Chronic venous hypertension (idiopathic) with ulcer and inflammation of bilateral lower extremity: Secondary | ICD-10-CM | POA: Diagnosis not present

## 2020-09-21 ENCOUNTER — Encounter (HOSPITAL_BASED_OUTPATIENT_CLINIC_OR_DEPARTMENT_OTHER): Payer: Medicare Other | Admitting: Internal Medicine

## 2020-09-24 DIAGNOSIS — I110 Hypertensive heart disease with heart failure: Secondary | ICD-10-CM | POA: Diagnosis not present

## 2020-09-24 DIAGNOSIS — E669 Obesity, unspecified: Secondary | ICD-10-CM | POA: Diagnosis not present

## 2020-09-24 DIAGNOSIS — L97821 Non-pressure chronic ulcer of other part of left lower leg limited to breakdown of skin: Secondary | ICD-10-CM | POA: Diagnosis not present

## 2020-09-24 DIAGNOSIS — I5032 Chronic diastolic (congestive) heart failure: Secondary | ICD-10-CM | POA: Diagnosis not present

## 2020-09-24 DIAGNOSIS — G4733 Obstructive sleep apnea (adult) (pediatric): Secondary | ICD-10-CM | POA: Diagnosis not present

## 2020-09-24 DIAGNOSIS — I87333 Chronic venous hypertension (idiopathic) with ulcer and inflammation of bilateral lower extremity: Secondary | ICD-10-CM | POA: Diagnosis not present

## 2020-09-28 ENCOUNTER — Other Ambulatory Visit: Payer: Self-pay

## 2020-09-28 ENCOUNTER — Encounter (HOSPITAL_BASED_OUTPATIENT_CLINIC_OR_DEPARTMENT_OTHER): Payer: Medicare Other | Attending: Internal Medicine | Admitting: Internal Medicine

## 2020-09-28 DIAGNOSIS — G4733 Obstructive sleep apnea (adult) (pediatric): Secondary | ICD-10-CM | POA: Diagnosis not present

## 2020-09-28 DIAGNOSIS — I87333 Chronic venous hypertension (idiopathic) with ulcer and inflammation of bilateral lower extremity: Secondary | ICD-10-CM | POA: Insufficient documentation

## 2020-09-28 DIAGNOSIS — I110 Hypertensive heart disease with heart failure: Secondary | ICD-10-CM | POA: Insufficient documentation

## 2020-09-28 DIAGNOSIS — L03115 Cellulitis of right lower limb: Secondary | ICD-10-CM | POA: Diagnosis not present

## 2020-09-28 DIAGNOSIS — Z8673 Personal history of transient ischemic attack (TIA), and cerebral infarction without residual deficits: Secondary | ICD-10-CM | POA: Insufficient documentation

## 2020-09-28 DIAGNOSIS — Z6841 Body Mass Index (BMI) 40.0 and over, adult: Secondary | ICD-10-CM | POA: Insufficient documentation

## 2020-09-28 DIAGNOSIS — I509 Heart failure, unspecified: Secondary | ICD-10-CM | POA: Insufficient documentation

## 2020-09-28 DIAGNOSIS — I890 Lymphedema, not elsewhere classified: Secondary | ICD-10-CM | POA: Diagnosis not present

## 2020-09-28 DIAGNOSIS — L97822 Non-pressure chronic ulcer of other part of left lower leg with fat layer exposed: Secondary | ICD-10-CM | POA: Insufficient documentation

## 2020-09-28 DIAGNOSIS — L97812 Non-pressure chronic ulcer of other part of right lower leg with fat layer exposed: Secondary | ICD-10-CM | POA: Insufficient documentation

## 2020-09-28 DIAGNOSIS — Z8616 Personal history of COVID-19: Secondary | ICD-10-CM | POA: Diagnosis not present

## 2020-09-28 DIAGNOSIS — Z9119 Patient's noncompliance with other medical treatment and regimen: Secondary | ICD-10-CM | POA: Diagnosis not present

## 2020-09-28 NOTE — Progress Notes (Signed)
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 felt to have an arterial issue in this clinic his pedal pulses and ABIs have been normal most recently in September/20 at 1.25 on the right and 1.27 on the left 3/23; he arrives with much less edema in both legs. He has home health changing the dressings. Been using silver alginate. He only has an open area along the wrap line of both legs. The rest of this seems to be closed. 4/6; better edema control in our compression wraps. He has not been using his external compression pumps he tells me because his wife was hospitalized for about 10 days. We have been using silver alginate on the wounds. 4/20; he has good edema control and compression wraps. His wife is  back at [facility] he says he has not been using the external compression pumps. Silver alginate to the wounds 5/4; he has good edema control bilaterally. He has not been using the compression pumps he tells [place] his wife is coming home from [place] today. He does not have any open wounds on the right leg continued large collection of small wounds on the left anterior leg extending medially into laterally nothing much posteriorly on the left. 6/1. He has a smattering of small wounds anteriorly on the left and a larger but superficial  wound on the left posterior calf. There is nothing open on the right we have been using silver alginate on the left I will change that the [facility] today 6/22; there is nothing open on his right leg. He has a smattering of small eschars on the left anterior lower leg but no open wounds per se. Somebody applied the compression wrap on the right leg in a very irregular fashion. He has a very tender area on the right medial ankle. 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 Not surprisingly comes back in with wounds on his bilateral legs at the level of where his external compression garment was tight superiorly. He tells me he is using his pumps once every second day. 7/20; 2-week follow-up the patient has not been using his compression pumps his wife was transferred to a new nursing home. He has 1 wound on the medial left lower leg and a small area on the right lateral 8/17; patient arrives today with only a smattering of small wounds on the left anterior lower leg most of these are eschared and look benign. There is nothing on his right leg. We have been using silver alginate under 4-layer compression 8/31; patient's wounds on the left anterior lower leg are larger. There is still nothing open on the right toe although we did put compression wraps on this last time. He has home health. Says he is used his compression pumps twice in the last 3 days. Obviously not sufficient 9/14; 2-week follow-up. We discharged him in his own zippered stocking. Apparently home health helped him change this and noted weeping edema fluid therefore put him back in a wrap he arrives in the clinic today with no open wounds on the right innumerable small broken areas on the anterior left calf. Uncontrolled edema above the level of the wrap compatible with lymphedema 9/27 2-week follow-up. He arrives today with the left anterior lower extremity  looking better. On the right he has a small open area posteriorly. I am going to put him back in compression on both sides. He claims compliance with his compression pumps at home twice a day he has home health changing his dressing 10/26; 1 month follow-up he has home health changing his dressings there is no open wound on the right leg he has extremitease stockings and external compression pumps which he says he is using once a day. The left leg is always been a more difficult site and although it is difficult to identify any precise open wound he has several leaking areas especially anteriorly and superiorly and at the edge of his wraps 11/9; 2-week follow-up. He has his extremities stocking on the right. He says he has been using his compression pumps once a day. He has 1 remaining wound on the left anterior mid tibia which looks healthy his edema control is otherwise good on the  left 11/30; there is now a 3-week follow-up. We use his own compression stocking on the right last time as he has no open wounds. He apparently developed a UTI received a course of ciprofloxacin. He did not wear his compression pumps and I wonder whether he actually was using his stocking. Home health put a compression wrap on his right leg last week. He has multiple small open areas on the left anterior leg this week. 12/7; still a cluster of small areas on the left lateral calf and the right lateral calf. He has home health changing his dressings. For some reason he will not use compression pumps reliably this week because his wife spent 2 days in the hospital with anemia 12/21; he continues to have a cluster of small areas predominantly on the left lateral but also the left anterior lower leg. More defined wounds on the right anterior and right medial. The he says he is using the compression pumps every other day. I have never understood the issue for this noncompliant. He still has home health coming out 1/11 the  patient continues to have multiple small open areas on the left lateral left anterior and medial lower leg anteriorly. He also has a small punched-out painful area in the crease of his left ankle which I think is probably a rapid injury. Very tender. I debrided in this area very adherent debris. Also small areas on the right lateral calf. We have been using silver alginate. His compression pump usage is probably marginal Electronic Signature(s) Signed: 09/28/2020 5:07:05 PM By: Baltazar Najjar MD Entered By: Baltazar Najjar on 09/28/2020 15:03:42 -------------------------------------------------------------------------------- Physical Exam Details Patient Name: Date of Service: [name] [name], [name] 09/28/2020 1:30 PM Medical Record Number: [id number] Patient Account Number: [id number] Date of Birth/Sex: Treating RN: 04-Nov-1955 (65 y.o. [name] [name] Primary Care Provider: Fara Chute Other Clinician: Referring Provider: Treating Provider/Extender: Lu Duffel in Treatment: 44 Notes Wound exam; bilateral left greater than right lower legs. 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 Multiple open areas in the left leg all small superficial except for a small area in the crease of his left ankle. A lot of edema in the left foot. I used a #3 curette to debride the area on the left ankle was very painful. Hemostasis with silver nitrate Electronic Signature(s) Signed: 09/28/2020 5:07:05 PM By: Baltazar Najjar MD Entered By: Baltazar Najjar on 09/28/2020 15:04:52 -------------------------------------------------------------------------------- Physician Orders Details Patient Name: Date of Service: [name] [name], [name] 09/28/2020 1:30 PM Medical Record Number: [id number] Patient Account Number: [id number] Date of Birth/Sex: Treating RN: 18-Nov-1955 (65 y.o. [name] [name] Primary Care Provider: Fara Chute Other Clinician: Referring  Provider: Treating Provider/Extender: Lu Duffel in Treatment: 31 Verbal / Phone Orders: No Diagnosis Coding Follow-up Appointments Return appointment in 3 weeks. Bathing/ Shower/ Hygiene May shower with protection but do not get wound dressing(s) wet. Edema Control - Lymphedema / SCD / Other Lymphedema Pumps. Use Lymphedema pumps on leg(s) 2-3 times a day for 45-60 minutes. If wearing any wraps or hose, do not remove them. Continue exercising as instructed. Elevate legs to the level of the heart or above for 30 minutes daily and/or when sitting, a frequency of: Avoid standing for long periods of time. Exercise regularly Home Health No change in wound care orders this week; continue Home Health for wound care. May utilize formulary equivalent dressing for wound treatment orders unless otherwise specified. Wound Treatment Wound #65 - Lower Leg  Wound Laterality: Left, Anterior, Distal Cleanser: Normal Saline (Home Health) (Generic) 2 x Per Day/30 Days Discharge Instructions: Cleanse the wound with Normal Saline prior to applying a clean dressing using gauze sponges, not tissue or cotton balls. Peri-Wound Care: Sween Lotion (Moisturizing lotion) (Home Health) (Generic) 2 x Per Day/30 Days Discharge Instructions: Apply moisturizing lotion as directed Prim Dressing: KerraCel Ag Gelling Fiber Dressing, 4x5 in (silver alginate) (Home Health) (Generic) 2 x Per Day/30 Days ary Discharge Instructions: Apply silver alginate to wound bed as instructed Secondary Dressing: Woven Gauze Sponge, Non-Sterile 4x4 in (Home Health) (Generic) 2 x Per Day/30 Days Discharge Instructions: Apply over primary dressing as directed. Secondary Dressing: ABD Pad, 5x9 (Home Health) 2 x Per Day/30 Days Discharge Instructions: Apply over primary dressing as directed. Compression Wrap: FourPress (4 layer compression wrap) (Home Health) (Generic) 2 x Per Day/30 Days Discharge Instructions: Apply  four layer compression as directed. Wound #68 - Lower Leg Wound Laterality: Left, Anterior, Proximal Cleanser: Normal Saline (Home Health) (Generic) 2 x Per Day/30 Days Discharge Instructions: Cleanse the wound with Normal Saline prior to applying a clean dressing using gauze sponges, not tissue or cotton balls. Peri-Wound Care: Sween Lotion (Moisturizing lotion) (Home Health) (Generic) 2 x Per Day/30 Days Discharge Instructions: Apply moisturizing lotion as directed Prim Dressing: KerraCel Ag Gelling Fiber Dressing, 4x5 in (silver alginate) (Home Health) (Generic) 2 x Per Day/30 Days ary Discharge Instructions: Apply silver alginate to wound bed as instructed Secondary Dressing: Woven Gauze Sponge, Non-Sterile 4x4 in (Home Health) (Generic) 2 x Per Day/30 Days Discharge Instructions: Apply over primary dressing as directed. Secondary Dressing: ABD Pad, 5x9 (Home Health) 2 x Per Day/30 Days Discharge Instructions: Apply over primary dressing as directed. Compression Wrap: FourPress (4 layer compression wrap) (Home Health) (Generic) 2 x Per Day/30 Days Discharge Instructions: Apply four layer compression as directed. Wound #72 - Lower Leg Wound Laterality: Right, Anterior Cleanser: Normal Saline (Home Health) (Generic) 2 x Per Day/30 Days Discharge Instructions: Cleanse the wound with Normal Saline prior to applying a clean dressing using gauze sponges, not tissue or cotton balls. Peri-Wound Care: Sween Lotion (Moisturizing lotion) (Home Health) (Generic) 2 x Per Day/30 Days Discharge Instructions: Apply moisturizing lotion as directed Prim Dressing: KerraCel Ag Gelling Fiber Dressing, 4x5 in (silver alginate) (Home Health) (Generic) 2 x Per Day/30 Days ary Discharge Instructions: Apply silver alginate to wound bed as instructed Secondary Dressing: Woven Gauze Sponge, Non-Sterile 4x4 in (Home Health) (Generic) 2 x Per Day/30 Days Discharge Instructions: Apply over primary dressing as  directed. Secondary Dressing: ABD Pad, 5x9 (Home Health) 2 x Per Day/30 Days Discharge Instructions: Apply over primary dressing as directed. Compression Wrap: FourPress (4 layer compression wrap) (Home Health) (Generic) 2 x Per Day/30 Days Discharge Instructions: Apply four layer compression as directed. Wound #73 - Lower Leg Wound Laterality: Right, Lateral Cleanser: Normal Saline (Home Health) (Generic) 2 x Per Day/30 Days Discharge Instructions: Cleanse the wound with Normal Saline prior to applying a clean dressing using gauze sponges, not tissue or cotton balls. Peri-Wound Care: Sween Lotion (Moisturizing lotion) (Home Health) (Generic) 2 x Per Day/30 Days Discharge Instructions: Apply moisturizing lotion as directed Prim Dressing: KerraCel Ag Gelling Fiber Dressing, 4x5 in (silver alginate) (Home Health) (Generic) 2 x Per Day/30 Days ary Discharge Instructions: Apply silver alginate to wound bed as instructed Secondary Dressing: Woven Gauze Sponge, Non-Sterile 4x4 in (Home Health) (Generic) 2 x Per Day/30 Days Discharge Instructions: Apply over primary dressing as directed. Secondary Dressing: ABD Pad,  5x9 (Home Health) 2 x Per Day/30 Days Discharge Instructions: Apply over primary dressing as directed. Compression Wrap: FourPress (4 layer compression wrap) (Home Health) (Generic) 2 x Per Day/30 Days Discharge Instructions: Apply four layer compression as directed. Wound #74 - Lower Leg Wound Laterality: Left, Lateral Cleanser: Normal Saline (Home Health) (Generic) 2 x Per Day/30 Days Discharge Instructions: Cleanse the wound with Normal Saline prior to applying a clean dressing using gauze sponges, not tissue or cotton balls. Peri-Wound Care: Sween Lotion (Moisturizing lotion) (Home Health) (Generic) 2 x Per Day/30 Days Discharge Instructions: Apply moisturizing lotion as directed Prim Dressing: KerraCel Ag Gelling Fiber Dressing, 4x5 in (silver alginate) (Home Health) (Generic) 2 x  Per Day/30 Days ary Discharge Instructions: Apply silver alginate to wound bed as instructed Secondary Dressing: Woven Gauze Sponge, Non-Sterile 4x4 in (Home Health) (Generic) 2 x Per Day/30 Days Discharge Instructions: Apply over primary dressing as directed. Secondary Dressing: ABD Pad, 5x9 (Home Health) 2 x Per Day/30 Days Discharge Instructions: Apply over primary dressing as directed. Compression Wrap: FourPress (4 layer compression wrap) (Home Health) (Generic) 2 x Per Day/30 Days Discharge Instructions: Apply four layer compression as directed. Wound #75 - Lower Leg Wound Laterality: Left, Posterior Cleanser: Normal Saline (Home Health) (Generic) 2 x Per Day/30 Days Discharge Instructions: Cleanse the wound with Normal Saline prior to applying a clean dressing using gauze sponges, not tissue or cotton balls. Peri-Wound Care: Sween Lotion (Moisturizing lotion) (Home Health) (Generic) 2 x Per Day/30 Days Discharge Instructions: Apply moisturizing lotion as directed Prim Dressing: KerraCel Ag Gelling Fiber Dressing, 4x5 in (silver alginate) (Home Health) (Generic) 2 x Per Day/30 Days ary Discharge Instructions: Apply silver alginate to wound bed as instructed Secondary Dressing: Woven Gauze Sponge, Non-Sterile 4x4 in (Home Health) (Generic) 2 x Per Day/30 Days Discharge Instructions: Apply over primary dressing as directed. Secondary Dressing: ABD Pad, 5x9 (Home Health) 2 x Per Day/30 Days Discharge Instructions: Apply over primary dressing as directed. Compression Wrap: FourPress (4 layer compression wrap) (Home Health) (Generic) 2 x Per Day/30 Days Discharge Instructions: Apply four layer compression as directed. Wound #76 - Foot Wound Laterality: Dorsal, Left Cleanser: Normal Saline (Home Health) (Generic) 2 x Per Day/30 Days Discharge Instructions: Cleanse the wound with Normal Saline prior to applying a clean dressing using gauze sponges, not tissue or cotton balls. Peri-Wound  Care: Sween Lotion (Moisturizing lotion) (Home Health) (Generic) 2 x Per Day/30 Days Discharge Instructions: Apply moisturizing lotion as directed Prim Dressing: KerraCel Ag Gelling Fiber Dressing, 4x5 in (silver alginate) (Home Health) (Generic) 2 x Per Day/30 Days ary Discharge Instructions: Apply silver alginate to wound bed as instructed Secondary Dressing: Woven Gauze Sponge, Non-Sterile 4x4 in (Home Health) (Generic) 2 x Per Day/30 Days Discharge Instructions: Apply over primary dressing as directed. Secondary Dressing: ABD Pad, 5x9 (Home Health) 2 x Per Day/30 Days Discharge Instructions: Apply over primary dressing as directed. Secondary Dressing: add extra foam for protection to left dorsal foot wound 2 x Per Day/30 Days Compression Wrap: FourPress (4 layer compression wrap) (Home Health) (Generic) 2 x Per Day/30 Days Discharge Instructions: Apply four layer compression as directed. Electronic Signature(s) Signed: 09/28/2020 5:07:05 PM By: Linton Ham MD Signed: 09/28/2020 5:35:07 PM By: Rhae Hammock RN Entered By: Rhae Hammock on 09/28/2020 14:37:19 -------------------------------------------------------------------------------- Problem List Details Patient Name: Date of Service: [name] [name], [place] W. 09/28/2020 1:30 PM Medical Record Number: [id number] Patient Account Number: [id number] Date of Birth/Sex: Treating RN: September 17, 1956 (65 y.o. 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 09/28/2020 1:30 PM Medical Record Number: [id number] Patient Account Number: [id number] Date of Birth/Sex: Treating RN: 1956/07/15 (65 y.o. [name], Lauren Primary Care Provider: [name] Other Clinician: Referring Provider: Treating Provider/Extender: [name] in Treatment: 77 Subjective History of Present Illness (HPI) The following HPI elements were documented for the patient's wound: Location: both legs Quality: Patient reports No Pain. long history of chronic venous hypertension,chf,morbid obesity. s/p gsv ablation. cva 2oo4. sleep apnea andhbp. breakdown of skin both legs around 2 months ago. treated here for this in 2015. no .dm. 05/09/2016 -- he had his arterial studies done last week and his right ABI was 1.3 and his left ABI was 1.4. His right toe brachial index was 0.98 and on the left was 0.9. Venous studies have only be done today and reports are awaited. 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 Donnetta Hutching. He had a arterial study done which showed a right ABI of 1.3 which is within normal limits at rest and a left ABI of 1.4 which is within normal limits at rest and may be falsely elevated. The right toe brachial index was 0.98 and the left toe brachial index was 0.90 05/30/2016 --  seen by Dr. Althea Charon -- is known to have a prior laser ablation of his left great saphenous vein in 2009. Prior to that he had 2 ablations of the odd same vein by interventional radiology. As noted to have severe venous hypertension bilaterally. 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 His right great saphenous vein is somewhat dilated but no evidence of reflux. The only deep venous reflux demonstrated was in his left common femoral vein. After every consideration Dr. Donnetta Hutching recommended reattempt at ablation versus removal of his left great saphenous vein in the operating room with the standard vein stripping technique. The patient would consider this and let him know again. 06/20/16 patient continues to wear a juxtalite on the right leg without any open areas. On the lateral aspect of his left leg he has 4 wounds and a small area on the medial area of the left leg. Using silver alginate under Profore 06/27/16 still no open areas on the right leg. On the lateral aspect of his left leg he has 4 wounds which continue to have a nonviable surface and wheeze been using silver alginate under Profore 07/04/2016 -- patient hasn't yet to contact his vascular surgeon regarding plans for surgical intervention and I believe he is trying his best to avoid surgery. I have again  discussed with him the futility of trying to heal this and keep it healed, if he does not agree to surgical intervention 07/11/2016 -- the patient has not had any juxta light ordered for at least 3 years and we will order him a pair. 08/08/2016 -- he has been approved for Apligraf and they will get this ready for him next week 08/15/2016 -- he has his first Apligraf applied today 08/29/2016 -- he has had his second application of Apligraf today 09/12/2016 -- his Apligraf has not arrived today due to the holiday 09/19/2016 -- he has had his third application of Apligraf  today 10/03/2016 -- he has had his fourth application of Apligraf today. 10/18/2016 -- his next Apligraf has not arrived today. He has had chronic problems with his back and was to start on steroids and I have told him there are no objections against this. He is also taking appropriate medications as per his orthopedic doctor. 10/24/2016 -- he is here for his fifth application of Apligraf today. 01/09/2017 -- is been having repeated falls and problems with his back and saw a spine surgeon who has recommended holding his anticoagulation and will have some epidural injections in a few weeks. 03/13/2017 - he had been doing very well with his left lower extremity and the ulcerations that come down significantly. However last week he may have hit himself against a metal cabinet and has started having abrasions and because of this has started weeping from the right lateral calf. He has not used his compression since morning and his right lower extremity has markedly increased and lymphedema 03/27/17; the patient appears to be doing very well only a small cluster of wounds on the right lateral lower extremity. Most of the areas on his left anterior and left posterior leg are closed the wrong way to closing. His compression slipped down today there is irritation where the wrap edge was but no evidence of infection 04/24/2017 -- the patient has been using his lymphedema pumps and is also wearing his new compression on the right lower extremity. 05/01/2017 -- he has begun using his lymphedema pumps for a longer period of time but unfortunately had a fall and may have bruised his left lateral lower extremity under the 4-layer compression wrap and has multiple ulcerations in this area today 06/05/2017 -- after examination today he is noted to have taken a significant turn for the worse with multiple open ulcerations on his left lower calf and anterior leg. Lymphedema is better controlled and there is no evidence  of cellulitis. I believe the patient is not being compliant with his lymphedema pumps. 06/12/2017 -- the patient did not come for his nurse visit change to the left lower extremity on Friday, as advised. He has not been doing his compression appropriately and now has developed a ulcerated area on the right lower extremity. He also has not been using his lymphedema pumps appropriately. in addition to this the patient tells me that he and his wife are going to the beach this coming Sunday for over a week 06/26/2017 -- the patient is back after 2 weeks when he had gone on vacation and his treatment was substandard and he did not do his lymphedema pump. He does not have any systemic symptoms 08/07/2017 -- he kept his compression stockings all week and says he has been using his compression wraps on the right lower leg. He is also saying he is diligent with his lymphedema pumps. 08/21/17; using his lymphedema pumps  about twice a week. He keeps his compression wraps on the left lower leg. He has his compression stocking on the right leg 12/14/18patient continues to be noncompliant with the lymphedema pumps. He has extremitease stockings on the right leg. He has a cluster of wounds on the left leg we have been using silver alginate 09/12/2017 -- over the Christmas holidays his right leg has become extremely large with lymphedema and weeping with ulceration and this is a huge step backward. I understand he has not been compliant with his diet or his lymphedema pumps 09/19/17 on evaluation today patient appears to be doing somewhat poorly due to the significant amount of fluid buildup in the right lower extremity especially. This has been somewhat macerated due to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 Fat Layer (Subcutaneous Tissue) exposed. There is no tunneling or undermining noted. There is a small amount of serosanguineous drainage noted. The wound margin is flat and intact. There is large (67-100%) red granulation within the wound bed. There is no necrotic tissue within the wound bed. Wound #76 status is Open. Original cause of wound was Gradually Appeared. The wound is located on the Left,Dorsal Foot. The wound measures 0.6cm length x 0.5cm width x 0.1cm depth; 0.236cm^2 area and 0.024cm^3 volume. There is Fat Layer (Subcutaneous Tissue) exposed. There is no tunneling or undermining noted. There is a medium amount of purulent drainage noted. The wound margin is flat and intact. There is no granulation within the wound bed. There is a large (67-100%) amount of necrotic tissue within the wound bed including Adherent Slough. 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 There was a Excisional Skin/Subcutaneous Tissue Debridement with a total area of 0.3 sq cm performed  by Ricard Dillon., MD. With the following instrument(s): Curette to remove Viable and Non-Viable tissue/material. Material removed includes Subcutaneous Tissue, Skin: Dermis, and Skin: Epidermis after achieving pain control using Lidocaine. No specimens were taken. A time out was conducted at 14:30, prior to the start of the procedure. A Moderate amount of bleeding was controlled with Silver Nitrate. The procedure was tolerated well with a pain level of 0 throughout and a pain level of 0 following the procedure. Post Debridement Measurements: 0.6cm length x 0.5cm width x 0.1cm depth; 0.024cm^3 volume. Character of Wound/Ulcer Post Debridement is improved. Post procedure Diagnosis Wound #76: Same as Pre-Procedure There was a Four Layer Compression Therapy Procedure by Rhae Hammock, RN. Post procedure Diagnosis Wound #: Same as Pre-Procedure Plan Follow-up Appointments: Return appointment in 3 weeks. Bathing/ Shower/ Hygiene: May shower with protection but do not get wound dressing(s) wet. Edema Control - Lymphedema / SCD / Other: Lymphedema Pumps. Use Lymphedema pumps on leg(s) 2-3 times a day for 45-60 minutes. If wearing any wraps or hose, do not remove them. Continue exercising as instructed. Elevate legs to the level of the heart or above for 30 minutes daily and/or when sitting, a frequency of: Avoid standing for long periods of time. Exercise regularly Home Health: No change in wound care orders this week; continue Home Health for wound care. May utilize formulary equivalent dressing for wound treatment orders unless otherwise specified. WOUND #65: - Lower Leg Wound Laterality: Left, Anterior, Distal Cleanser: Normal Saline (Home Health) (Generic) 2 x Per Day/30 Days Discharge Instructions: Cleanse the wound with Normal Saline prior to applying a clean dressing using gauze sponges, not tissue or cotton balls. Peri-Wound Care:  Sween Lotion (Moisturizing lotion) (Home Health)  (Generic) 2 x Per Day/30 Days Discharge Instructions: Apply moisturizing lotion as directed Prim Dressing: KerraCel Ag Gelling Fiber Dressing, 4x5 in (silver alginate) (Home Health) (Generic) 2 x Per Day/30 Days ary Discharge Instructions: Apply silver alginate to wound bed as instructed Secondary Dressing: Woven Gauze Sponge, Non-Sterile 4x4 in (Home Health) (Generic) 2 x Per Day/30 Days Discharge Instructions: Apply over primary dressing as directed. Secondary Dressing: ABD Pad, 5x9 (Home Health) 2 x Per Day/30 Days Discharge Instructions: Apply over primary dressing as directed. Com pression Wrap: FourPress (4 layer compression wrap) (Home Health) (Generic) 2 x Per Day/30 Days Discharge Instructions: Apply four layer compression as directed. WOUND #68: - Lower Leg Wound Laterality: Left, Anterior, Proximal Cleanser: Normal Saline (Home Health) (Generic) 2 x Per Day/30 Days Discharge Instructions: Cleanse the wound with Normal Saline prior to applying a clean dressing using gauze sponges, not tissue or cotton balls. Peri-Wound Care: Sween Lotion (Moisturizing lotion) (Home Health) (Generic) 2 x Per Day/30 Days Discharge Instructions: Apply moisturizing lotion as directed Prim Dressing: KerraCel Ag Gelling Fiber Dressing, 4x5 in (silver alginate) (Home Health) (Generic) 2 x Per Day/30 Days ary Discharge Instructions: Apply silver alginate to wound bed as instructed Secondary Dressing: Woven Gauze Sponge, Non-Sterile 4x4 in (Home Health) (Generic) 2 x Per Day/30 Days Discharge Instructions: Apply over primary dressing as directed. Secondary Dressing: ABD Pad, 5x9 (Home Health) 2 x Per Day/30 Days Discharge Instructions: Apply over primary dressing as directed. Com pression Wrap: FourPress (4 layer compression wrap) (Home Health) (Generic) 2 x Per Day/30 Days Discharge Instructions: Apply four layer compression as directed. WOUND #72: - Lower Leg Wound Laterality: Right, Anterior Cleanser:  Normal Saline (Home Health) (Generic) 2 x Per Day/30 Days Discharge Instructions: Cleanse the wound with Normal Saline prior to applying a clean dressing using gauze sponges, not tissue or cotton balls. Peri-Wound Care: Sween Lotion (Moisturizing lotion) (Home Health) (Generic) 2 x Per Day/30 Days Discharge Instructions: Apply moisturizing lotion as directed Prim Dressing: KerraCel Ag Gelling Fiber Dressing, 4x5 in (silver alginate) (Home Health) (Generic) 2 x Per Day/30 Days ary Discharge Instructions: Apply silver alginate to wound bed as instructed Secondary Dressing: Woven Gauze Sponge, Non-Sterile 4x4 in (Home Health) (Generic) 2 x Per Day/30 Days Discharge Instructions: Apply over primary dressing as directed. Secondary Dressing: ABD Pad, 5x9 (Home Health) 2 x Per Day/30 Days Discharge Instructions: Apply over primary dressing as directed. Com pression Wrap: FourPress (4 layer compression wrap) (Home Health) (Generic) 2 x Per Day/30 Days Discharge Instructions: Apply four layer compression as directed. WOUND #73: - Lower Leg Wound Laterality: Right, Lateral Cleanser: Normal Saline (Home Health) (Generic) 2 x Per Day/30 Days Discharge Instructions: Cleanse the wound with Normal Saline prior to applying a clean dressing using gauze sponges, not tissue or cotton balls. Peri-Wound Care: Sween Lotion (Moisturizing lotion) (Home Health) (Generic) 2 x Per Day/30 Days Discharge Instructions: Apply moisturizing lotion as directed Prim Dressing: KerraCel Ag Gelling Fiber Dressing, 4x5 in (silver alginate) (Home Health) (Generic) 2 x Per Day/30 Days ary Discharge Instructions: Apply silver alginate to wound bed as instructed Secondary Dressing: Woven Gauze Sponge, Non-Sterile 4x4 in (Home Health) (Generic) 2 x Per Day/30 Days Discharge Instructions: Apply over primary dressing as directed. Secondary Dressing: ABD Pad, 5x9 (Home Health) 2 x Per Day/30 Days Discharge Instructions: Apply over primary  dressing as directed. Com pression Wrap: FourPress (4 layer compression wrap) (Home Health) (Generic) 2 x Per Day/30 Days Discharge Instructions: Apply four  layer compression as directed. WOUND #74: - Lower Leg Wound Laterality: Left, Lateral Cleanser: Normal Saline (Home Health) (Generic) 2 x Per Day/30 Days Discharge Instructions: Cleanse the wound with Normal Saline prior to applying a clean dressing using gauze sponges, not tissue or cotton balls. Peri-Wound Care: Sween Lotion (Moisturizing lotion) (Home Health) (Generic) 2 x Per Day/30 Days Discharge Instructions: Apply moisturizing lotion as directed Prim Dressing: KerraCel Ag Gelling Fiber Dressing, 4x5 in (silver alginate) (Home Health) (Generic) 2 x Per Day/30 Days ary Discharge Instructions: Apply silver alginate to wound bed as instructed Secondary Dressing: Woven Gauze Sponge, Non-Sterile 4x4 in (Home Health) (Generic) 2 x Per Day/30 Days Discharge Instructions: Apply over primary dressing as directed. Secondary Dressing: ABD Pad, 5x9 (Home Health) 2 x Per Day/30 Days Discharge Instructions: Apply over primary dressing as directed. Com pression Wrap: FourPress (4 layer compression wrap) (Home Health) (Generic) 2 x Per Day/30 Days Discharge Instructions: Apply four layer compression as directed. WOUND #75: - Lower Leg Wound Laterality: Left, Posterior Cleanser: Normal Saline (Home Health) (Generic) 2 x Per Day/30 Days Discharge Instructions: Cleanse the wound with Normal Saline prior to applying a clean dressing using gauze sponges, not tissue or cotton balls. Peri-Wound Care: Sween Lotion (Moisturizing lotion) (Home Health) (Generic) 2 x Per Day/30 Days Discharge Instructions: Apply moisturizing lotion as directed Prim Dressing: KerraCel Ag Gelling Fiber Dressing, 4x5 in (silver alginate) (Home Health) (Generic) 2 x Per Day/30 Days ary Discharge Instructions: Apply silver alginate to wound bed as instructed Secondary Dressing:  Woven Gauze Sponge, Non-Sterile 4x4 in (Home Health) (Generic) 2 x Per Day/30 Days Discharge Instructions: Apply over primary dressing as directed. Secondary Dressing: ABD Pad, 5x9 (Home Health) 2 x Per Day/30 Days Discharge Instructions: Apply over primary dressing as directed. Com pression Wrap: FourPress (4 layer compression wrap) (Home Health) (Generic) 2 x Per Day/30 Days Discharge Instructions: Apply four layer compression as directed. WOUND #76: - Foot Wound Laterality: Dorsal, Left Cleanser: Normal Saline (Home Health) (Generic) 2 x Per Day/30 Days Discharge Instructions: Cleanse the wound with Normal Saline prior to applying a clean dressing using gauze sponges, not tissue or cotton balls. Peri-Wound Care: Sween Lotion (Moisturizing lotion) (Home Health) (Generic) 2 x Per Day/30 Days Discharge Instructions: Apply moisturizing lotion as directed Prim Dressing: KerraCel Ag Gelling Fiber Dressing, 4x5 in (silver alginate) (Home Health) (Generic) 2 x Per Day/30 Days ary Discharge Instructions: Apply silver alginate to wound bed as instructed Secondary Dressing: Woven Gauze Sponge, Non-Sterile 4x4 in (Home Health) (Generic) 2 x Per Day/30 Days Discharge Instructions: Apply over primary dressing as directed. Secondary Dressing: ABD Pad, 5x9 (Home Health) 2 x Per Day/30 Days Discharge Instructions: Apply over primary dressing as directed. Secondary Dressing: add extra foam for protection to left dorsal foot wound 2 x Per Day/30 Days Com pression Wrap: FourPress (4 layer compression wrap) (Home Health) (Generic) 2 x Per Day/30 Days Discharge Instructions: Apply four layer compression as directed. #1 I do not see any reason to change the palliative dressing this man is on. 2. He is supposed to be using his compression pumps twice a day I think on most days he may use it once I think some days he does not. 3. The only other option here would be lymphedema clinic lymphedema wraps  etc. Electronic Signature(s) Signed: 09/28/2020 5:07:05 PM By: Linton Ham MD Entered By: Linton Ham on 09/28/2020 15:07:35 -------------------------------------------------------------------------------- SuperBill Details Patient Name: Date of Service: [name] [name], [place] W. 09/28/2020 Medical Record Number: [id number] Patient  Account Number: [id number] Date of Birth/Sex: Treating RN: 04-Nov-1955 (65 y.o. [name], Lauren Primary Care Provider: [name] Other Clinician: Referring Provider: Treating Provider/Extender: [name] in Treatment: 44 Diagnosis Coding ICD-10 Codes Code Description [phone] Chronic venous hypertension (idiopathic) with ulcer and inflammation of bilateral lower extremity I89.0 Lymphedema, not elsewhere classified L97.821 Non-pressure chronic ulcer of other part of left lower leg limited to breakdown of skin L97.811 Non-pressure chronic ulcer of other part of right lower leg limited to breakdown of skin L03.115 Cellulitis of right lower limb Facility Procedures CPT4 Code: [id number] Description: [id number] - DEB SUBQ TISSUE 20 SQ CM/< ICD-10 Diagnosis Description L97.821 Non-pressure chronic ulcer of other part of left lower leg limited to breakdown Modifier: of skin Quantity: 1 Physician Procedures : CPT4 Code Description Modifier [id number] - WC PHYS SUBQ TISS 20 SQ CM ICD-10 Diagnosis Description L97.821 Non-pressure chronic ulcer of other part of left lower leg limited to breakdown of skin Quantity: 1 Electronic Signature(s) Signed: 09/28/2020 5:07:05 PM By: Linton Ham MD Entered By: Linton Ham on 09/28/2020 15:07:46

## 2020-10-01 DIAGNOSIS — I110 Hypertensive heart disease with heart failure: Secondary | ICD-10-CM | POA: Diagnosis not present

## 2020-10-01 DIAGNOSIS — E669 Obesity, unspecified: Secondary | ICD-10-CM | POA: Diagnosis not present

## 2020-10-01 DIAGNOSIS — I87333 Chronic venous hypertension (idiopathic) with ulcer and inflammation of bilateral lower extremity: Secondary | ICD-10-CM | POA: Diagnosis not present

## 2020-10-01 DIAGNOSIS — G4733 Obstructive sleep apnea (adult) (pediatric): Secondary | ICD-10-CM | POA: Diagnosis not present

## 2020-10-01 DIAGNOSIS — L97821 Non-pressure chronic ulcer of other part of left lower leg limited to breakdown of skin: Secondary | ICD-10-CM | POA: Diagnosis not present

## 2020-10-01 DIAGNOSIS — I5032 Chronic diastolic (congestive) heart failure: Secondary | ICD-10-CM | POA: Diagnosis not present

## 2020-10-05 NOTE — Progress Notes (Signed)
[name] [name] ([id number]) , Visit Report for 09/28/2020 Arrival Information Details Patient Name: Date of Service: [name] [name] RDIN W. 09/28/2020 1:30 PM Medical Record Number: [id number] Patient Account Number: [id number] Date of Birth/Sex: Treating RN: 17-Jun-1956 (65 y.o. [name], Lauren Primary Care Elkin Belfield: [name] Other Clinician: Referring Yatzil Clippinger: Treating Jadiel Schmieder/Extender: [name] in Treatment: 72 Visit Information History Since Last Visit Added or deleted any medications: No Patient Arrived: Ambulatory Any new allergies or adverse reactions: No Arrival Time: 13:33 Had a fall or experienced change in No Accompanied By: self activities of daily living that may affect Transfer Assistance: None risk of falls: Patient Identification Verified: Yes Signs or symptoms of abuse/neglect since last visito No Secondary Verification Process Completed: Yes Hospitalized since last visit: No Patient Requires Transmission-Based Precautions: No Implantable device outside of the clinic excluding No Patient Has Alerts: Yes cellular tissue based products placed in the center Patient Alerts: R ABI = 1.25 since last visit: L ABI = 1.27 Has Dressing in Place as Prescribed: Yes Pain Present Now: No Electronic Signature(s) Signed: 10/05/2020 9:18:12 AM By: Sandre Kitty Entered By: Sandre Kitty on 09/28/2020 13:35:10 -------------------------------------------------------------------------------- Compression Therapy Details Patient Name: Date of Service: [name] [name], [place] W. 09/28/2020 1:30 PM Medical Record Number: [id number] Patient Account Number: [id number] Date of Birth/Sex: Treating RN: 08-May-1956 (65 y.o. [name], Lauren Primary Care Falecia Vannatter: [name] Other Clinician: Referring Dontae Minerva: Treating Trevia Nop/Extender: [name] in Treatment: 16 Compression Therapy Performed for Wound Assessment: NonWound  Condition Lymphedema - Bilateral Leg Performed By: Clinician Rhae Hammock, RN Compression Type: Four Layer Post Procedure Diagnosis Same as Pre-procedure Electronic Signature(s) Signed: 09/28/2020 5:35:07 PM By: Rhae Hammock RN Entered By: Rhae Hammock on 09/28/2020 14:34:31 -------------------------------------------------------------------------------- Encounter Discharge Information Details Patient Name: Date of Service: [name] [name], [place] W. 09/28/2020 1:30 PM Medical Record Number: [id number] Patient Account Number: [id number] Date of Birth/Sex: Treating RN: August 06, 1956 (65 y.o. [name] Primary Care Teandre Hamre: [name] Other Clinician: Referring Trooper Olander: Treating Gwyneth Fernandez/Extender: [name] in Treatment: 78 Encounter Discharge Information Items Post Procedure Vitals Discharge Condition: Stable Temperature (F): 97.5 Ambulatory Status: Cane Pulse (bpm): 71 Discharge Destination: Home Respiratory Rate (breaths/min): 18 Transportation: Private Auto Blood Pressure (mmHg): 161/83 Accompanied By: alone Schedule Follow-up Appointment: Yes Clinical Summary of Care: Patient Declined Electronic Signature(s) Signed: 09/30/2020 5:28:50 PM By: Levan Hurst RN, BSN Entered By: Levan Hurst on 09/28/2020 17:54:37 -------------------------------------------------------------------------------- Lower Extremity Assessment Details Patient Name: Date of Service: [name] [name], [name] 09/28/2020 1:30 PM Medical Record Number: [id number] Patient Account Number: [id number] Date of Birth/Sex: Treating RN: Feb 28, 1956 (65 y.o. [name] Primary Care Carmen Tolliver: [name] Other Clinician: Referring Bryanne Riquelme: Treating Velencia Lenart/Extender: [name] in Treatment: 44 Edema Assessment Assessed: [name]: No] [name]: No] Edema: [Left: Yes] [Right: Yes] Calf Left: Right: Point of Measurement: 30 cm From Medial  Instep 40 cm 44.7 cm Ankle Left: Right: Point of Measurement: 13 cm From Medial Instep 25.5 cm 27 cm Vascular Assessment Pulses: Dorsalis Pedis Palpable: [Left:Yes] [Right:Yes] Electronic Signature(s) Signed: 09/28/2020 6:04:09 PM By: [name] RN, BSN Entered By: [name] on 09/28/2020 14:11:09 -------------------------------------------------------------------------------- Multi Wound Chart Details Patient Name: Date of Service: [name] [name], [name] 09/28/2020 1:30 PM Medical Record Number: [id number] Patient Account Number: [id number] Date of Birth/Sex: Treating RN: 1956/04/15 (65 y.o. [name], Lauren Primary Care Davionne Dowty: [name] Other Clinician: Referring Staci Dack: Treating Ereka Brau/Extender: [name] in  Treatment: 44 Vital Signs Height(in): 70 Pulse(bpm): 71 Weight(lbs): 360 Blood Pressure(mmHg): 161/83 Body Mass Index(BMI): 52 Temperature(F): 97.5 Respiratory Rate(breaths/min): 18 Photos: [65:No Photos Left, Distal, Anterior Lower Leg] [68:No Photos Left, Proximal, Anterior Lower Leg] [72:No Photos Right, Anterior Lower Leg] Wound Location: [65:Gradually Appeared] [68:Gradually Appeared] [72:Gradually Appeared] Wounding Event: [65:Venous Leg Ulcer] [68:Lymphedema] [72:Lymphedema] Primary Etiology: [65:Lymphedema] [68:N/A] [72:N/A] Secondary Etiology: [65:Lymphedema, Sleep Apnea,] [68:Lymphedema, Sleep Apnea,] [72:Lymphedema, Sleep Apnea,] Comorbid History: [65:Congestive Heart Failure, Hypertension, Peripheral Venous Disease, Osteoarthritis 09/08/2019] [68:Congestive Heart Failure, Hypertension, Peripheral Venous Disease, Osteoarthritis 03/23/2020] [72:Congestive Heart Failure,  Hypertension, Peripheral Venous Disease, Osteoarthritis 08/17/2020] Date A cquired: [65:44] [68:27] [72:6] Weeks of Treatment: [65:Open] [68:Open] [72:Open] Wound Status: [65:Yes] [68:No] [72:No] Clustered Wound: [65:1] [68:N/A]  [72:N/A] Clustered Quantity: [65:2x0.5x0.1] [68:0.4x0.4x0.1] [72:0.9x0.6x0.1] Measurements L x W x D (cm) [65:0.785] [68:0.126] [72:0.424] A (cm) : rea [65:0.079] [68:0.013] [72:0.042] Volume (cm) : [65:99.90%] [68:93.10%] [72:48.10%] % Reduction in A [65:rea: 99.90%] [68:92.80%] [72:48.80%] % Reduction in Volume: [65:Full Thickness Without Exposed] [68:Full Thickness Without Exposed] [72:Full Thickness Without Exposed] Classification: [65:Support Structures Medium] [68:Support Structures Small] [72:Support Structures Medium] Exudate A mount: [65:Serosanguineous] [68:Serosanguineous] [72:Serosanguineous] Exudate Type: [65:red, brown] [68:red, brown] [72:red, brown] Exudate Color: [65:Flat and Intact] [68:Flat and Intact] [72:Flat and Intact] Wound Margin: [65:Large (67-100%)] [68:Large (67-100%)] [72:Large (67-100%)] Granulation A mount: [65:Red] [68:Red] [72:Red] Granulation Quality: [65:None Present (0%)] [68:Small (1-33%)] [72:Small (1-33%)] Necrotic A mount: [65:Fat Layer (Subcutaneous Tissue): Yes Fat Layer (Subcutaneous Tissue): Yes Fat Layer (Subcutaneous Tissue): Yes] Exposed Structures: [65:Fascia: No Tendon: No Muscle: No Joint: No Bone: No Medium (34-66%)] [68:Fascia: No Tendon: No Muscle: No Joint: No Bone: No Medium (34-66%)] [72:Fascia: No Tendon: No Muscle: No Joint: No Bone: No Medium (34-66%)] Epithelialization: [65:N/A] [68:N/A] [72:N/A] Debridement: [65:N/A] [68:N/A] [72:N/A] Pain Control: [65:N/A] [68:N/A] [72:N/A] Tissue Debrided: [65:N/A] [68:N/A] [72:N/A] Level: [65:N/A] [68:N/A] [72:N/A] Debridement A (sq cm): [65:rea N/A] [68:N/A] [72:N/A] Instrument: [65:N/A] [68:N/A] [72:N/A] Bleeding: [65:N/A] [68:N/A] [72:N/A] Hemostasis A chieved: [65:N/A] [68:N/A] [72:N/A] Procedural Pain: [65:N/A] [68:N/A] [72:N/A] Post Procedural Pain: Debridement Treatment Response: N/A [68:N/A] [72:N/A] Post Debridement Measurements L x N/A [68:N/A] [72:N/A] W x D (cm) [65:N/A]  [68:N/A] [72:N/A] Post Debridement Volume: (cm) [65:N/A] [68:N/A] [72:N/A] Wound Number: [id number] Photos: No Photos No Photos No Photos Right, Lateral Lower Leg Left, Lateral Lower Leg Left, Posterior Lower Leg Wound Location: Gradually Appeared Gradually Appeared Gradually Appeared Wounding Event: Lymphedema Lymphedema Lymphedema Primary Etiology: N/A N/A N/A Secondary Etiology: Lymphedema, Sleep Apnea, Lymphedema, Sleep Apnea, Lymphedema, Sleep Apnea, Comorbid History: Congestive Heart Failure, Congestive Heart Failure, Congestive Heart Failure, Hypertension, Peripheral Venous Hypertension, Peripheral Venous Hypertension, Peripheral Venous Disease, Osteoarthritis Disease, Osteoarthritis Disease, Osteoarthritis 08/03/2020 09/28/2020 09/28/2020 Date Acquired: 6 0 0 Weeks of Treatment: Open Open Open Wound Status: No No No Clustered Wound: N/A N/A N/A Clustered Quantity: 3.5x2x0.1 1.2x0.6x0.2 0.7x0.9x0.1 Measurements L x W x D (cm) 5.498 0.565 0.495 A (cm) : rea 0.55 0.113 0.049 Volume (cm) : 92.90% 0.00% 0.00% % Reduction in Area: 92.90% 0.00% 0.00% % Reduction in Volume: Full Thickness Without Exposed Full Thickness Without Exposed Full Thickness Without Exposed Classification: Support Structures Support Structures Support Structures Medium Medium Small Exudate A mount: Serosanguineous Serosanguineous Serosanguineous Exudate Type: red, brown red, brown red, brown Exudate Color: Flat and Intact Flat and Intact Flat and Intact Wound Margin: Large (67-100%) Large (67-100%) Large (67-100%) Granulation A mount: Red Red Red Granulation Quality: None Present (0%) None Present (0%) None Present (0%) Necrotic A mount: Fat Layer (Subcutaneous Tissue): Yes Fat Layer (Subcutaneous Tissue):  Yes Fat Layer (Subcutaneous Tissue): Yes Exposed Structures: Fascia: No Fascia: No Fascia: No Tendon: No Tendon: No Tendon: No Muscle: No Muscle: No Muscle: No Joint:  No Joint: No Joint: No Bone: No Bone: No Bone: No Medium (34-66%) Medium (34-66%) Medium (34-66%) Epithelialization: N/A N/A N/A Debridement: N/A N/A N/A Pain Control: N/A N/A N/A Tissue Debrided: N/A N/A N/A Level: N/A N/A N/A Debridement A (sq cm): rea N/A N/A N/A Instrument: N/A N/A N/A Bleeding: N/A N/A N/A Hemostasis A chieved: N/A N/A N/A Procedural Pain: N/A N/A N/A Post Procedural Pain: Debridement Treatment Response: N/A N/A N/A Post Debridement Measurements L x N/A N/A N/A W x D (cm) N/A N/A N/A Post Debridement Volume: (cm) N/A N/A N/A Procedures Performed: Wound Number: 76 N/A N/A Photos: No Photos N/A N/A Left, Dorsal Foot N/A N/A Wound Location: Gradually Appeared N/A N/A Wounding Event: Lymphedema N/A N/A Primary Etiology: N/A N/A N/A Secondary Etiology: Lymphedema, Sleep Apnea, N/A N/A Comorbid History: Congestive Heart Failure, Hypertension, Peripheral Venous Disease, Osteoarthritis 09/28/2020 N/A N/A Date Acquired: 0 N/A N/A Weeks of Treatment: Open N/A N/A Wound Status: No N/A N/A Clustered Wound: N/A N/A N/A Clustered Quantity: 0.6x0.5x0.1 N/A N/A Measurements L x W x D (cm) 0.236 N/A N/A A (cm) : rea 0.024 N/A N/A Volume (cm) : 0.00% N/A N/A % Reduction in A rea: 0.00% N/A N/A % Reduction in Volume: Full Thickness Without Exposed N/A N/A Classification: Support Structures Medium N/A N/A Exudate A mount: Purulent N/A N/A Exudate Type: yellow, brown, green N/A N/A Exudate Color: Flat and Intact N/A N/A Wound Margin: None Present (0%) N/A N/A Granulation A mount: N/A N/A N/A Granulation Quality: Large (67-100%) N/A N/A Necrotic A mount: Fat Layer (Subcutaneous Tissue): Yes N/A N/A Exposed Structures: Fascia: No Tendon: No Muscle: No Joint: No Bone: No Small (1-33%) N/A N/A Epithelialization: Debridement - Excisional N/A N/A Debridement: Pre-procedure Verification/Time Out 14:30 N/A  N/A Taken: Lidocaine N/A N/A Pain Control: Subcutaneous N/A N/A Tissue Debrided: Skin/Subcutaneous Tissue N/A N/A Level: 0.3 N/A N/A Debridement A (sq cm): rea Curette N/A N/A Instrument: Moderate N/A N/A Bleeding: Silver Nitrate N/A N/A Hemostasis A chieved: 0 N/A N/A Procedural Pain: 0 N/A N/A Post Procedural Pain: Procedure was tolerated well N/A N/A Debridement Treatment Response: Post Debridement Measurements L x 0.6x0.5x0.1 N/A N/A W x D (cm) 0.024 N/A N/A Post Debridement Volume: (cm) Debridement N/A N/A Procedures Performed: Treatment Notes Electronic Signature(s) Signed: 09/28/2020 5:07:05 PM By: Linton Ham MD Signed: 09/28/2020 5:35:07 PM By: Rhae Hammock RN Entered By: Linton Ham on 09/28/2020 14:50:40 -------------------------------------------------------------------------------- Multi-Disciplinary Care [name] Details Patient Name: Date of Service: [name] [name], [name] 09/28/2020 1:30 PM Medical Record Number: [id number] Patient Account Number: [id number] Date of Birth/Sex: Treating RN: 01-05-1956 (65 y.o. [name], Lauren Primary Care Shanae Luo: [name] Other Clinician: Referring Aryahna Spagna: Treating Syanne Looney/Extender: [name] in Treatment: 50 Active Inactive Wound/Skin Impairment Nursing Diagnoses: Knowledge deficit related to ulceration/compromised skin integrity Goals: Patient/caregiver will verbalize understanding of skin care regimen Date Initiated: 11/25/2019 Target Resolution Date: 10/16/2020 Goal Status: Active Ulcer/skin breakdown will have a volume reduction of 30% by week 4 Date Initiated: 11/25/2019 Date Inactivated: 01/06/2020 Target Resolution Date: 12/26/2019 Goal Status: Met Ulcer/skin breakdown will have a volume reduction of 50% by week 8 Date Initiated: 01/06/2020 Date Inactivated: 02/17/2020 Target Resolution Date: 02/05/2020 Goal Status: Met Interventions: Assess patient/caregiver  ability to obtain necessary supplies Assess patient/caregiver ability to perform ulcer/skin care regimen upon admission and as needed  Assess ulceration(s) every visit Notes: Electronic Signature(s) Signed: 09/28/2020 5:35:07 PM By: Rhae Hammock RN Entered By: Rhae Hammock on 09/28/2020 14:25:15 -------------------------------------------------------------------------------- Pain Assessment Details Patient Name: Date of Service: [name] [name], [name] 09/28/2020 1:30 PM Medical Record Number: [id number] Patient Account Number: [id number] Date of Birth/Sex: Treating RN: 13-Mar-1956 (65 y.o. [name] Primary Care Keelin Sheridan: [name] Other Clinician: Referring Jiovanny Burdell: Treating Jordynne Mccown/Extender: [name] in Treatment: 39 Active Problems Location of Pain Severity and Description of Pain Patient Has Paino No Site Locations Rate the pain. Current Pain Level: 0 Pain Management and Medication Current Pain Management: Electronic Signature(s) Signed: 09/28/2020 6:04:09 PM By: [name] RN, BSN Entered By: [name] on 09/28/2020 14:09:19 -------------------------------------------------------------------------------- Patient/Caregiver Education Details Patient Name: Date of Service: [name] [name] 1/11/2022andnbsp1:30 PM Medical Record Number: [id number] Patient Account Number: [id number] Date of Birth/Gender: Treating RN: [birth year] (65 y.o. [name] Primary Care Physician: [name] Other Clinician: Referring Physician: Treating Physician/Extender: [name] in Treatment: 64 Education Assessment Education Provided To: Patient Education Topics Provided Wound/Skin Impairment: Methods: Explain/Verbal Responses: State content correctly Electronic Signature(s) Signed: 09/28/2020 5:35:07 PM By: Rhae Hammock RN Entered By: Rhae Hammock on 09/28/2020  14:26:34 -------------------------------------------------------------------------------- Wound Assessment Details Patient Name: Date of Service: [name] [name], [name] 09/28/2020 1:30 PM Medical Record Number: [id number] Patient Account Number: [id number] Date of Birth/Sex: Treating RN: 10-11-1955 (65 y.o. [name], Lauren Primary Care Debanhi Blaker: [name] Other Clinician: Referring Jordie Skalsky: Treating Satoya Feeley/Extender: [name] in Treatment: 68 Wound Status Wound Number: 65 Primary Venous Leg Ulcer Etiology: Wound Location: Left, Distal, Anterior Lower Leg Secondary Lymphedema Wounding Event: Gradually Appeared Etiology: Date Acquired: 09/08/2019 Wound Open Weeks Of Treatment: 44 Status: Clustered Wound: Yes Comorbid Lymphedema, Sleep Apnea, Congestive Heart Failure, History: Hypertension, Peripheral Venous Disease, Osteoarthritis Photos Photo Uploaded By: Mikeal Hawthorne on 09/30/2020 10:38:38 Wound Measurements Length: (cm) 2 Width: (cm) 0. Depth: (cm) 0. Clustered Quantity: 1 Area: (cm) 0 Volume: (cm) 0 % Reduction in Area: 99.9% 5 % Reduction in Volume: 99.9% 1 Epithelialization: Medium (34-66%) Tunneling: No .785 Undermining: No .079 Wound Description Classification: Full Thickness Without Exposed Support Structures Wound Margin: Flat and Intact Exudate Amount: Medium Exudate Type: Serosanguineous Exudate Color: red, brown Foul Odor After Cleansing: No Slough/Fibrino No Wound Bed Granulation Amount: Large (67-100%) Exposed Structure Granulation Quality: Red Fascia Exposed: No Necrotic Amount: None Present (0%) Fat Layer (Subcutaneous Tissue) Exposed: Yes Tendon Exposed: No Muscle Exposed: No Joint Exposed: No Bone Exposed: No Treatment Notes Wound #65 (Lower Leg) Wound Laterality: Left, Anterior, Distal Cleanser Normal Saline Discharge Instruction: Cleanse the wound with Normal Saline prior to applying a clean  dressing using gauze sponges, not tissue or cotton balls. Peri-Wound Care Sween Lotion (Moisturizing lotion) Discharge Instruction: Apply moisturizing lotion as directed Topical Primary Dressing KerraCel Ag Gelling Fiber Dressing, 4x5 in (silver alginate) Discharge Instruction: Apply silver alginate to wound bed as instructed Secondary Dressing Woven Gauze Sponge, Non-Sterile 4x4 in Discharge Instruction: Apply over primary dressing as directed. ABD Pad, 5x9 Discharge Instruction: Apply over primary dressing as directed. Secured With Compression Wrap FourPress (4 layer compression wrap) Discharge Instruction: Apply four layer compression as directed. Compression Stockings Add-Ons Electronic Signature(s) Signed: 09/28/2020 5:35:07 PM By: Rhae Hammock RN Signed: 09/28/2020 6:04:09 PM By: [name] RN, BSN Entered By: [name] on 09/28/2020 14:15:15 -------------------------------------------------------------------------------- Wound Assessment Details Patient Name: Date of Service: [name] [name], HA [place] W. 09/28/2020 1:30 PM Medical  Record Number: [id number] Patient Account Number: [id number] Date of Birth/Sex: Treating RN: 1956/07/07 (65 y.o. [name], Lauren Primary Care Theola Cuellar: [name] Other Clinician: Referring Nikoletta Varma: Treating Jordi Kamm/Extender: [name] in Treatment: 11 Wound Status Wound Number: 68 Primary Lymphedema Etiology: Wound Location: Left, Proximal, Anterior Lower Leg Wound Open Wounding Event: Gradually Appeared Status: Date Acquired: 03/23/2020 Comorbid Lymphedema, Sleep Apnea, Congestive Heart Failure, Weeks Of Treatment: 27 History: Hypertension, Peripheral Venous Disease, Osteoarthritis Clustered Wound: No Photos Photo Uploaded By: Mikeal Hawthorne on 09/30/2020 10:38:39 Wound Measurements Length: (cm) 0.4 Width: (cm) 0.4 Depth: (cm) 0.1 Area: (cm) 0.126 Volume: (cm) 0.013 Wound  Description Classification: Full Thickness Without Exposed Support Structures Wound Margin: Flat and Intact Exudate Amount: Small Exudate Type: Serosanguineous Exudate Color: red, brown Foul Odor After Cleansing: Slough/Fibrino % Reduction in Area: 93.1% % Reduction in Volume: 92.8% Epithelialization: Medium (34-66%) Tunneling: No Undermining: No No No Wound Bed Granulation Amount: Large (67-100%) Exposed Structure Granulation Quality: Red Fascia Exposed: No Necrotic Amount: Small (1-33%) Fat Layer (Subcutaneous Tissue) Exposed: Yes Necrotic Quality: Adherent Slough Tendon Exposed: No Muscle Exposed: No Joint Exposed: No Bone Exposed: No Treatment Notes Wound #68 (Lower Leg) Wound Laterality: Left, Anterior, Proximal Cleanser Normal Saline Discharge Instruction: Cleanse the wound with Normal Saline prior to applying a clean dressing using gauze sponges, not tissue or cotton balls. Peri-Wound Care Sween Lotion (Moisturizing lotion) Discharge Instruction: Apply moisturizing lotion as directed Topical Primary Dressing KerraCel Ag Gelling Fiber Dressing, 4x5 in (silver alginate) Discharge Instruction: Apply silver alginate to wound bed as instructed Secondary Dressing Woven Gauze Sponge, Non-Sterile 4x4 in Discharge Instruction: Apply over primary dressing as directed. ABD Pad, 5x9 Discharge Instruction: Apply over primary dressing as directed. Secured With Compression Wrap FourPress (4 layer compression wrap) Discharge Instruction: Apply four layer compression as directed. Compression Stockings Add-Ons Electronic Signature(s) Signed: 09/28/2020 5:35:07 PM By: Rhae Hammock RN Signed: 09/28/2020 6:04:09 PM By: [name] RN, BSN Entered By: [name] on 09/28/2020 14:16:10 -------------------------------------------------------------------------------- Wound Assessment Details Patient Name: Date of Service: [name] [name], [name] 09/28/2020 1:30  PM Medical Record Number: [id number] Patient Account Number: [id number] Date of Birth/Sex: Treating RN: 06-Jan-1956 (65 y.o. [name], Lauren Primary Care Amorah Sebring: [name] Other Clinician: Referring Orvetta Danielski: Treating Jobanny Mavis/Extender: [name] in Treatment: 35 Wound Status Wound Number: 72 Primary Lymphedema Etiology: Etiology: Wound Location: Right, Anterior Lower Leg Wound Open Wounding Event: Gradually Appeared Status: Date Acquired: 08/17/2020 Comorbid Lymphedema, Sleep Apnea, Congestive Heart Failure, Weeks Of Treatment: 6 History: Hypertension, Peripheral Venous Disease, Osteoarthritis Clustered Wound: No Photos Photo Uploaded By: Mikeal Hawthorne on 09/30/2020 10:38:04 Wound Measurements Length: (cm) 0.9 Width: (cm) 0.6 Depth: (cm) 0.1 Area: (cm) 0.424 Volume: (cm) 0.042 % Reduction in Area: 48.1% % Reduction in Volume: 48.8% Epithelialization: Medium (34-66%) Tunneling: No Undermining: No Wound Description Classification: Full Thickness Without Exposed Support Structures Wound Margin: Flat and Intact Exudate Amount: Medium Exudate Type: Serosanguineous Exudate Color: red, brown Foul Odor After Cleansing: No Slough/Fibrino No Wound Bed Granulation Amount: Large (67-100%) Exposed Structure Granulation Quality: Red Fascia Exposed: No Necrotic Amount: Small (1-33%) Fat Layer (Subcutaneous Tissue) Exposed: Yes Necrotic Quality: Adherent Slough Tendon Exposed: No Muscle Exposed: No Joint Exposed: No Bone Exposed: No Treatment Notes Wound #72 (Lower Leg) Wound Laterality: Right, Anterior Cleanser Normal Saline Discharge Instruction: Cleanse the wound with Normal Saline prior to applying a clean dressing using gauze sponges, not tissue or cotton balls. Peri-Wound Care Sween Lotion (Moisturizing lotion) Discharge Instruction: Apply  moisturizing lotion as directed Topical Primary Dressing KerraCel Ag Gelling Fiber  Dressing, 4x5 in (silver alginate) Discharge Instruction: Apply silver alginate to wound bed as instructed Secondary Dressing Woven Gauze Sponge, Non-Sterile 4x4 in Discharge Instruction: Apply over primary dressing as directed. ABD Pad, 5x9 Discharge Instruction: Apply over primary dressing as directed. Secured With Compression Wrap FourPress (4 layer compression wrap) Discharge Instruction: Apply four layer compression as directed. Compression Stockings Add-Ons Electronic Signature(s) Signed: 09/28/2020 5:35:07 PM By: Rhae Hammock RN Signed: 09/28/2020 6:04:09 PM By: [name] RN, BSN Entered By: [name] on 09/28/2020 14:16:46 -------------------------------------------------------------------------------- Wound Assessment Details Patient Name: Date of Service: [name] [name], HA [place] 09/28/2020 1:30 PM Medical Record Number: [id number] Patient Account Number: [id number] Date of Birth/Sex: Treating RN: July 02, 1956 (65 y.o. [name], Lauren Primary Care Rodolph Hagemann: [name] Other Clinician: Referring [name] Eckersley: Treating Shanelle Clontz/Extender: [name] in Treatment: 83 Wound Status Wound Number: 73 Primary Lymphedema Etiology: Wound Location: Right, Lateral Lower Leg Wound Open Wounding Event: Gradually Appeared Status: Date Acquired: 08/03/2020 Comorbid Lymphedema, Sleep Apnea, Congestive Heart Failure, Weeks Of Treatment: 6 History: Hypertension, Peripheral Venous Disease, Osteoarthritis Clustered Wound: No Photos Photo Uploaded By: Mikeal Hawthorne on 09/30/2020 10:38:15 Wound Measurements Length: (cm) 3.5 Width: (cm) 2 Depth: (cm) 0.1 Area: (cm) 5.498 Volume: (cm) 0.55 % Reduction in Area: 92.9% % Reduction in Volume: 92.9% Epithelialization: Medium (34-66%) Tunneling: No Undermining: No Wound Description Classification: Full Thickness Without Exposed Support Structures Wound Margin: Flat and Intact Exudate Amount:  Medium Exudate Type: Serosanguineous Exudate Color: red, brown Foul Odor After Cleansing: No Slough/Fibrino No Wound Bed Granulation Amount: Large (67-100%) Exposed Structure Granulation Quality: Red Fascia Exposed: No Necrotic Amount: None Present (0%) Fat Layer (Subcutaneous Tissue) Exposed: Yes Tendon Exposed: No Muscle Exposed: No Joint Exposed: No Bone Exposed: No Treatment Notes Wound #73 (Lower Leg) Wound Laterality: Right, Lateral Cleanser Normal Saline Discharge Instruction: Cleanse the wound with Normal Saline prior to applying a clean dressing using gauze sponges, not tissue or cotton balls. Peri-Wound Care Sween Lotion (Moisturizing lotion) Discharge Instruction: Apply moisturizing lotion as directed Topical Primary Dressing KerraCel Ag Gelling Fiber Dressing, 4x5 in (silver alginate) Discharge Instruction: Apply silver alginate to wound bed as instructed Secondary Dressing Woven Gauze Sponge, Non-Sterile 4x4 in Discharge Instruction: Apply over primary dressing as directed. ABD Pad, 5x9 Discharge Instruction: Apply over primary dressing as directed. Secured With Compression Wrap FourPress (4 layer compression wrap) Discharge Instruction: Apply four layer compression as directed. Compression Stockings Add-Ons Electronic Signature(s) Signed: 09/28/2020 5:35:07 PM By: Rhae Hammock RN Signed: 09/28/2020 6:04:09 PM By: [name] RN, BSN Entered By: [name] on 09/28/2020 14:17:15 -------------------------------------------------------------------------------- Wound Assessment Details Patient Name: Date of Service: [name] [name], HA [place] 09/28/2020 1:30 PM Medical Record Number: [id number] Patient Account Number: [id number] Date of Birth/Sex: Treating RN: 04/29/1956 (65 y.o. [name], Lauren Primary Care Godfrey Tritschler: [name] Other Clinician: Referring Vyolet Sakuma: Treating Samarion Ehle/Extender: [name] in Treatment:  74 Wound Status Wound Number: 74 Primary Lymphedema Etiology: Wound Location: Left, Lateral Lower Leg Wound Open Wounding Event: Gradually Appeared Status: Date Acquired: 09/28/2020 Comorbid Lymphedema, Sleep Apnea, Congestive Heart Failure, Weeks Of Treatment: 0 History: Hypertension, Peripheral Venous Disease, Osteoarthritis Clustered Wound: No Photos Photo Uploaded By: Mikeal Hawthorne on 09/30/2020 10:39:05 Wound Measurements Length: (cm) 1.2 Width: (cm) 0.6 Depth: (cm) 0.2 Area: (cm) 0.565 Volume: (cm) 0.113 % Reduction in Area: 0% % Reduction in Volume: 0% Epithelialization: Medium (34-66%) Tunneling: No Undermining: No Wound Description Classification:  Full Thickness Without Exposed Support Structures Wound Margin: Flat and Intact Exudate Amount: Medium Exudate Type: Serosanguineous Exudate Color: red, brown Foul Odor After Cleansing: No Slough/Fibrino No Wound Bed Granulation Amount: Large (67-100%) Exposed Structure Granulation Quality: Red Fascia Exposed: No Necrotic Amount: None Present (0%) Fat Layer (Subcutaneous Tissue) Exposed: Yes Tendon Exposed: No Muscle Exposed: No Joint Exposed: No Bone Exposed: No Treatment Notes Wound #74 (Lower Leg) Wound Laterality: Left, Lateral Cleanser Normal Saline Discharge Instruction: Cleanse the wound with Normal Saline prior to applying a clean dressing using gauze sponges, not tissue or cotton balls. Peri-Wound Care Sween Lotion (Moisturizing lotion) Discharge Instruction: Apply moisturizing lotion as directed Topical Primary Dressing KerraCel Ag Gelling Fiber Dressing, 4x5 in (silver alginate) Discharge Instruction: Apply silver alginate to wound bed as instructed Secondary Dressing Woven Gauze Sponge, Non-Sterile 4x4 in Discharge Instruction: Apply over primary dressing as directed. ABD Pad, 5x9 Discharge Instruction: Apply over primary dressing as directed. Secured With Compression Wrap FourPress (4  layer compression wrap) Discharge Instruction: Apply four layer compression as directed. Compression Stockings Add-Ons Electronic Signature(s) Signed: 09/28/2020 5:35:07 PM By: Rhae Hammock RN Signed: 09/28/2020 6:04:09 PM By: [name] RN, BSN Entered By: [name] on 09/28/2020 14:18:26 -------------------------------------------------------------------------------- Wound Assessment Details Patient Name: Date of Service: [name] [name], HA [place] W. 09/28/2020 1:30 PM Medical Record Number: [id number] Patient Account Number: [id number] Date of Birth/Sex: Treating RN: April 04, 1956 (65 y.o. [name], Lauren Primary Care Sofhia Ulibarri: [name] Other Clinician: Referring Kilee Hedding: Treating Tayshon Winker/Extender: [name] in Treatment: 50 Wound Status Wound Number: 75 Primary Lymphedema Etiology: Wound Location: Left, Posterior Lower Leg Wound Open Wounding Event: Gradually Appeared Status: Date Acquired: 09/28/2020 Comorbid Lymphedema, Sleep Apnea, Congestive Heart Failure, Weeks Of Treatment: 0 History: Hypertension, Peripheral Venous Disease, Osteoarthritis Clustered Wound: No Photos Photo Uploaded By: Mikeal Hawthorne on 09/30/2020 10:39:06 Wound Measurements Length: (cm) 0.7 Width: (cm) 0.9 Depth: (cm) 0.1 Area: (cm) 0.495 Volume: (cm) 0.049 % Reduction in Area: 0% % Reduction in Volume: 0% Epithelialization: Medium (34-66%) Tunneling: No Undermining: No Wound Description Classification: Full Thickness Without Exposed Support Structures Wound Margin: Flat and Intact Exudate Amount: Small Exudate Type: Serosanguineous Exudate Color: red, brown Foul Odor After Cleansing: No Slough/Fibrino No Wound Bed Granulation Amount: Large (67-100%) Exposed Structure Granulation Quality: Red Fascia Exposed: No Necrotic Amount: None Present (0%) Fat Layer (Subcutaneous Tissue) Exposed: Yes Tendon Exposed: No Muscle Exposed: No Joint Exposed:  No Bone Exposed: No Treatment Notes Wound #75 (Lower Leg) Wound Laterality: Left, Posterior Cleanser Normal Saline Discharge Instruction: Cleanse the wound with Normal Saline prior to applying a clean dressing using gauze sponges, not tissue or cotton balls. Peri-Wound Care Sween Lotion (Moisturizing lotion) Discharge Instruction: Apply moisturizing lotion as directed Topical Primary Dressing KerraCel Ag Gelling Fiber Dressing, 4x5 in (silver alginate) Discharge Instruction: Apply silver alginate to wound bed as instructed Secondary Dressing Woven Gauze Sponge, Non-Sterile 4x4 in Discharge Instruction: Apply over primary dressing as directed. ABD Pad, 5x9 Discharge Instruction: Apply over primary dressing as directed. Secured With Compression Wrap FourPress (4 layer compression wrap) Discharge Instruction: Apply four layer compression as directed. Compression Stockings Add-Ons Electronic Signature(s) Signed: 09/28/2020 5:35:07 PM By: Rhae Hammock RN Signed: 09/28/2020 6:04:09 PM By: [name] RN, BSN Entered By: [name] on 09/28/2020 14:19:07 -------------------------------------------------------------------------------- Wound Assessment Details Patient Name: Date of Service: [name] [name], HA [place] 09/28/2020 1:30 PM Medical Record Number: [id number] Patient Account Number: [id number] Date of Birth/Sex: Treating RN: 25-Jun-1956 (65 y.o. [name], Lauren Primary  Care Andrey Hoobler: [name] Other Clinician: Referring [place]: Treating Jadelyn Elks/Extender: [name] in Treatment: 51 Wound Status Wound Number: 76 Primary Lymphedema Etiology: Wound Location: Left, Dorsal Foot Wound Open Wounding Event: Gradually Appeared Status: Date Acquired: 09/28/2020 Comorbid Lymphedema, Sleep Apnea, Congestive Heart Failure, Weeks Of Treatment: 0 History: Hypertension, Peripheral Venous Disease, Osteoarthritis Clustered Wound: No Wound  Measurements Length: (cm) 0.6 Width: (cm) 0.5 Depth: (cm) 0.1 Area: (cm) 0.236 Volume: (cm) 0.024 % Reduction in Area: 0% % Reduction in Volume: 0% Epithelialization: Small (1-33%) Tunneling: No Undermining: No Wound Description Classification: Full Thickness Without Exposed Support Structures Wound Margin: Flat and Intact Exudate Amount: Medium Exudate Type: Purulent Exudate Color: yellow, brown, green Foul Odor After Cleansing: No Slough/Fibrino Yes Wound Bed Granulation Amount: None Present (0%) Exposed Structure Necrotic Amount: Large (67-100%) Fascia Exposed: No Necrotic Quality: Adherent Slough Fat Layer (Subcutaneous Tissue) Exposed: Yes Tendon Exposed: No Muscle Exposed: No Joint Exposed: No Bone Exposed: No Treatment Notes Wound #76 (Foot) Wound Laterality: Dorsal, Left Cleanser Normal Saline Discharge Instruction: Cleanse the wound with Normal Saline prior to applying a clean dressing using gauze sponges, not tissue or cotton balls. Peri-Wound Care Sween Lotion (Moisturizing lotion) Discharge Instruction: Apply moisturizing lotion as directed Topical Primary Dressing KerraCel Ag Gelling Fiber Dressing, 4x5 in (silver alginate) Discharge Instruction: Apply silver alginate to wound bed as instructed Secondary Dressing Woven Gauze Sponge, Non-Sterile 4x4 in Discharge Instruction: Apply over primary dressing as directed. ABD Pad, 5x9 Discharge Instruction: Apply over primary dressing as directed. add extra foam for protection to left dorsal foot wound Secured With Compression Wrap FourPress (4 layer compression wrap) Discharge Instruction: Apply four layer compression as directed. Compression Stockings Add-Ons Electronic Signature(s) Signed: 09/28/2020 5:35:07 PM By: Rhae Hammock RN Signed: 09/28/2020 6:04:09 PM By: [name] RN, BSN Entered By: [name] on 09/28/2020  14:19:48 -------------------------------------------------------------------------------- Vitals Details Patient Name: Date of Service: [name] [name], [name] 09/28/2020 1:30 PM Medical Record Number: [id number] Patient Account Number: [id number] Date of Birth/Sex: Treating RN: 10-01-1955 (65 y.o. [name], Lauren Primary Care Khaza Blansett: [name] Other Clinician: Referring Djibril Glogowski: Treating Daizy Outen/Extender: [name] in Treatment: 44 Vital Signs Time Taken: 13:36 Temperature (F): 97.5 Height (in): 70 Pulse (bpm): 71 Weight (lbs): 360 Respiratory Rate (breaths/min): 18 Body Mass Index (BMI): 51.6 Blood Pressure (mmHg): 161/83 Reference Range: 80 - 120 mg / dl Electronic Signature(s) Signed: 10/05/2020 9:18:12 AM By: Sandre Kitty Entered By: Sandre Kitty on 09/28/2020 13:37:04

## 2020-10-07 DIAGNOSIS — L97821 Non-pressure chronic ulcer of other part of left lower leg limited to breakdown of skin: Secondary | ICD-10-CM | POA: Diagnosis not present

## 2020-10-07 DIAGNOSIS — I5032 Chronic diastolic (congestive) heart failure: Secondary | ICD-10-CM | POA: Diagnosis not present

## 2020-10-07 DIAGNOSIS — G4733 Obstructive sleep apnea (adult) (pediatric): Secondary | ICD-10-CM | POA: Diagnosis not present

## 2020-10-07 DIAGNOSIS — I87333 Chronic venous hypertension (idiopathic) with ulcer and inflammation of bilateral lower extremity: Secondary | ICD-10-CM | POA: Diagnosis not present

## 2020-10-07 DIAGNOSIS — I110 Hypertensive heart disease with heart failure: Secondary | ICD-10-CM | POA: Diagnosis not present

## 2020-10-07 DIAGNOSIS — E669 Obesity, unspecified: Secondary | ICD-10-CM | POA: Diagnosis not present

## 2020-10-14 DIAGNOSIS — I87333 Chronic venous hypertension (idiopathic) with ulcer and inflammation of bilateral lower extremity: Secondary | ICD-10-CM | POA: Diagnosis not present

## 2020-10-14 DIAGNOSIS — I5032 Chronic diastolic (congestive) heart failure: Secondary | ICD-10-CM | POA: Diagnosis not present

## 2020-10-14 DIAGNOSIS — Z8673 Personal history of transient ischemic attack (TIA), and cerebral infarction without residual deficits: Secondary | ICD-10-CM | POA: Diagnosis not present

## 2020-10-14 DIAGNOSIS — G4733 Obstructive sleep apnea (adult) (pediatric): Secondary | ICD-10-CM | POA: Diagnosis not present

## 2020-10-14 DIAGNOSIS — I110 Hypertensive heart disease with heart failure: Secondary | ICD-10-CM | POA: Diagnosis not present

## 2020-10-14 DIAGNOSIS — L97821 Non-pressure chronic ulcer of other part of left lower leg limited to breakdown of skin: Secondary | ICD-10-CM | POA: Diagnosis not present

## 2020-10-14 DIAGNOSIS — Z9981 Dependence on supplemental oxygen: Secondary | ICD-10-CM | POA: Diagnosis not present

## 2020-10-14 DIAGNOSIS — E669 Obesity, unspecified: Secondary | ICD-10-CM | POA: Diagnosis not present

## 2020-10-16 DIAGNOSIS — N200 Calculus of kidney: Secondary | ICD-10-CM | POA: Diagnosis not present

## 2020-10-16 DIAGNOSIS — I110 Hypertensive heart disease with heart failure: Secondary | ICD-10-CM | POA: Diagnosis not present

## 2020-10-16 DIAGNOSIS — E1121 Type 2 diabetes mellitus with diabetic nephropathy: Secondary | ICD-10-CM | POA: Diagnosis not present

## 2020-10-16 DIAGNOSIS — I5033 Acute on chronic diastolic (congestive) heart failure: Secondary | ICD-10-CM | POA: Diagnosis not present

## 2020-10-19 ENCOUNTER — Encounter (HOSPITAL_BASED_OUTPATIENT_CLINIC_OR_DEPARTMENT_OTHER): Payer: Medicare Other | Admitting: Internal Medicine

## 2020-10-22 DIAGNOSIS — I87333 Chronic venous hypertension (idiopathic) with ulcer and inflammation of bilateral lower extremity: Secondary | ICD-10-CM | POA: Diagnosis not present

## 2020-10-22 DIAGNOSIS — I5032 Chronic diastolic (congestive) heart failure: Secondary | ICD-10-CM | POA: Diagnosis not present

## 2020-10-22 DIAGNOSIS — I110 Hypertensive heart disease with heart failure: Secondary | ICD-10-CM | POA: Diagnosis not present

## 2020-10-22 DIAGNOSIS — G4733 Obstructive sleep apnea (adult) (pediatric): Secondary | ICD-10-CM | POA: Diagnosis not present

## 2020-10-22 DIAGNOSIS — L97821 Non-pressure chronic ulcer of other part of left lower leg limited to breakdown of skin: Secondary | ICD-10-CM | POA: Diagnosis not present

## 2020-10-22 DIAGNOSIS — E669 Obesity, unspecified: Secondary | ICD-10-CM | POA: Diagnosis not present

## 2020-10-26 ENCOUNTER — Encounter (HOSPITAL_BASED_OUTPATIENT_CLINIC_OR_DEPARTMENT_OTHER): Payer: Medicare Other | Attending: Internal Medicine | Admitting: Internal Medicine

## 2020-10-26 ENCOUNTER — Other Ambulatory Visit: Payer: Self-pay

## 2020-10-26 DIAGNOSIS — L97822 Non-pressure chronic ulcer of other part of left lower leg with fat layer exposed: Secondary | ICD-10-CM | POA: Diagnosis not present

## 2020-10-26 DIAGNOSIS — I509 Heart failure, unspecified: Secondary | ICD-10-CM | POA: Insufficient documentation

## 2020-10-26 DIAGNOSIS — L97812 Non-pressure chronic ulcer of other part of right lower leg with fat layer exposed: Secondary | ICD-10-CM | POA: Diagnosis not present

## 2020-10-26 DIAGNOSIS — L03115 Cellulitis of right lower limb: Secondary | ICD-10-CM | POA: Diagnosis not present

## 2020-10-26 DIAGNOSIS — I110 Hypertensive heart disease with heart failure: Secondary | ICD-10-CM | POA: Insufficient documentation

## 2020-10-26 DIAGNOSIS — L97811 Non-pressure chronic ulcer of other part of right lower leg limited to breakdown of skin: Secondary | ICD-10-CM | POA: Diagnosis not present

## 2020-10-26 DIAGNOSIS — I87333 Chronic venous hypertension (idiopathic) with ulcer and inflammation of bilateral lower extremity: Secondary | ICD-10-CM | POA: Diagnosis not present

## 2020-10-26 DIAGNOSIS — L97821 Non-pressure chronic ulcer of other part of left lower leg limited to breakdown of skin: Secondary | ICD-10-CM | POA: Diagnosis not present

## 2020-10-26 DIAGNOSIS — I890 Lymphedema, not elsewhere classified: Secondary | ICD-10-CM | POA: Diagnosis not present

## 2020-10-26 DIAGNOSIS — L97222 Non-pressure chronic ulcer of left calf with fat layer exposed: Secondary | ICD-10-CM | POA: Diagnosis not present

## 2020-10-26 DIAGNOSIS — Z8673 Personal history of transient ischemic attack (TIA), and cerebral infarction without residual deficits: Secondary | ICD-10-CM | POA: Insufficient documentation

## 2020-10-26 NOTE — Progress Notes (Signed)
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 Not surprisingly comes back in with wounds on his bilateral legs at the level of where his external  compression garment was tight superiorly. He tells me he is using his pumps once every second day. 7/20; 2-week follow-up the patient has not been using his compression pumps his wife was transferred to a new nursing home. He has 1 wound on the medial left lower leg and a small area on the right lateral 8/17; patient arrives today with only a smattering of small wounds on the left anterior lower leg most of these are eschared and look benign. There is nothing on his right leg. We have been using silver alginate under 4-layer compression 8/31; patient's wounds on the left anterior lower leg are larger. There is still nothing open on the right toe although we did put compression wraps on this last time. He has home health. Says he is used his compression pumps twice in the last 3 days. Obviously not sufficient 9/14; 2-week follow-up. We discharged him in his own zippered stocking. Apparently home health helped him change this and noted weeping edema fluid therefore put him back in a  wrap he arrives in the clinic today with no open wounds on the right innumerable small broken areas on the anterior left calf. Uncontrolled edema above the level of the wrap compatible with lymphedema 9/27 2-week follow-up. He arrives today with the left anterior lower extremity looking better. On the right he has a small open area posteriorly. I am going to put him back in compression on both sides. He claims compliance with his compression pumps at home twice a day he has home health changing his dressing 10/26; 1 month follow-up he has home health changing his dressings there is no open wound on the right leg he has extremitease stockings and external compression pumps which he says he is using once a day. The left leg is always been a more difficult site and although it is difficult to identify any precise open wound he has several leaking areas especially anteriorly and superiorly and at the edge of his wraps 11/9; 2-week follow-up. He has his extremities stocking on the right. He says he has been using his compression pumps once a day. He has 1 remaining wound on the left anterior mid tibia which looks healthy his edema control is otherwise good on the left 11/30; there is now a 3-week follow-up. We use his own compression stocking on the right last time as he has no open wounds. He apparently developed a UTI received a course of ciprofloxacin. He did not wear his compression pumps and I wonder whether he actually was using his stocking. Home health put a compression wrap on his right leg last week. He has multiple small open areas on the left anterior leg this week. 12/7; still a cluster of small areas on the left lateral calf and the right lateral calf. He has home health changing his dressings. For some reason he will not use compression pumps reliably this week because his wife spent 2 days in the hospital with anemia 12/21; he continues to have a cluster of small areas predominantly on the  left lateral but also the left anterior lower leg. More defined wounds on the right anterior and right medial. The he says he is using the compression pumps every other day. I have never understood the issue for this noncompliant. He still has home health coming out 1/11 the patient continues to have multiple small open areas on the left lateral left anterior and medial lower leg anteriorly. He also has a small punched-out painful area in  the crease of his left ankle which I think is probably a rapid injury. Very tender. I debrided in this area very adherent debris. Also small areas on the right lateral calf. We have been using silver alginate. His compression pump usage is probably marginal 2/8; the patient comes back in with his legs essentially in the same condition. He has multiple small areas on his bilateral lower extremities left greater than right anteriorly and posteriorly. A lot of these have small eschars on top some of them do not. His edema control is marginal. He says he uses his pumps once a day. He has home health changing his dressing 4-layer compression silver alginate as the primary dressing Electronic Signature(s) Signed: 10/26/2020 4:37:05 PM By: Linton Ham MD Entered By: Linton Ham on 10/26/2020 14:11:36 -------------------------------------------------------------------------------- Physical Exam Details Patient Name: Date of Service: [name] [name], [name] 10/26/2020 1:15 PM Medical Record Number: [id number] Patient Account Number: [id number] Date of Birth/Sex: Treating RN: 05-30-1956 (65 y.o. [name] Primary Care Provider: [name] Other Clinician: Referring Provider: Treating Provider/Extender: [name] in Treatment: 70 Constitutional Patient is hypertensive.. Pulse regular and within target range for patient.Marland Kitchen Respirations regular, non-labored and within target range.. Temperature is normal and within the target range  for the patient.Marland Kitchen Appears in no distress. Cardiovascular Dorsalis pedis pulses are palpable even through the edema fluid. Notes Wound exam; bilateral left greater than right lower legs. 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 No real change in the multiple almost innumerable small open areas on the left anterior lower leg. Most of these covered and adherent black eschar. Similar but less impressive changes on the right leg. His edema control is marginal. There is no evidence of infection. Electronic Signature(s) Signed: 10/26/2020 4:37:05 PM By: Linton Ham MD Entered By: Linton Ham on 10/26/2020 14:13:30 -------------------------------------------------------------------------------- Physician Orders Details Patient Name: Date of Service: [name] [name], [name] 10/26/2020 1:15 PM Medical Record Number: [id number] Patient Account Number: [id number] Date of Birth/Sex: Treating RN: 28-Dec-1955 (65 y.o. [name] Primary Care Provider: [name] Other Clinician: Referring Provider: Treating Provider/Extender: [name] in Treatment: [id number] Verbal / Phone Orders: No Diagnosis Coding Follow-up Appointments Return appointment in 3 weeks. Bathing/ Shower/ Hygiene May shower with protection but do not get wound dressing(s) wet. Edema Control - Lymphedema / SCD / Other Lymphedema Pumps. Use Lymphedema pumps on leg(s) 2-3 times a day for 45-60 minutes. If wearing any wraps or hose, do not remove them. Continue exercising as instructed. Elevate legs to the level of the heart or above for 30 minutes daily and/or when sitting, a frequency of: Avoid standing for long periods of time. Exercise regularly Home Health No change in wound care orders this week; continue Home Health for wound care. May utilize formulary equivalent dressing for wound treatment orders unless otherwise specified. Wound Treatment Wound #65 - Lower Leg Wound  Laterality: Left, Anterior, Distal Cleanser: Normal Saline (Home Health) (Generic) 2 x Per Day/30 Days Discharge Instructions: Cleanse the wound with Normal Saline prior to applying a clean dressing using gauze sponges, not tissue or cotton balls. Peri-Wound Care: Sween Lotion (Moisturizing lotion) (Home Health) (Generic) 2 x Per Day/30 Days Discharge Instructions: Apply moisturizing lotion as directed Prim Dressing: KerraCel Ag Gelling Fiber Dressing, 4x5 in (silver alginate) (Home Health) (Generic) 2 x Per Day/30 Days ary Discharge Instructions: Apply silver alginate to wound bed as instructed Secondary Dressing: Woven Gauze Sponge, Non-Sterile 4x4 in [facility]) (Generic)  2 x Per Day/30 Days Discharge Instructions: Apply over primary dressing as directed. Secondary Dressing: ABD Pad, 5x9 (Home Health) 2 x Per Day/30 Days Discharge Instructions: Apply over primary dressing as directed. Compression Wrap: FourPress (4 layer compression wrap) (Home Health) (Generic) 2 x Per Day/30 Days Discharge Instructions: Apply four layer compression as directed. Wound #68 - Lower Leg Wound Laterality: Left, Anterior, Proximal Cleanser: Normal Saline (Home Health) (Generic) 2 x Per Day/30 Days Discharge Instructions: Cleanse the wound with Normal Saline prior to applying a clean dressing using gauze sponges, not tissue or cotton balls. Peri-Wound Care: Sween Lotion (Moisturizing lotion) (Home Health) (Generic) 2 x Per Day/30 Days Discharge Instructions: Apply moisturizing lotion as directed Prim Dressing: KerraCel Ag Gelling Fiber Dressing, 4x5 in (silver alginate) (Home Health) (Generic) 2 x Per Day/30 Days ary Discharge Instructions: Apply silver alginate to wound bed as instructed Secondary Dressing: Woven Gauze Sponge, Non-Sterile 4x4 in (Home Health) (Generic) 2 x Per Day/30 Days Discharge Instructions: Apply over primary dressing as directed. Secondary Dressing: ABD Pad, 5x9 (Home Health) 2 x Per  Day/30 Days Discharge Instructions: Apply over primary dressing as directed. Compression Wrap: FourPress (4 layer compression wrap) (Home Health) (Generic) 2 x Per Day/30 Days Discharge Instructions: Apply four layer compression as directed. Wound #73 - Lower Leg Wound Laterality: Right, Lateral Cleanser: Normal Saline (Home Health) (Generic) 2 x Per Day/30 Days Discharge Instructions: Cleanse the wound with Normal Saline prior to applying a clean dressing using gauze sponges, not tissue or cotton balls. Peri-Wound Care: Sween Lotion (Moisturizing lotion) (Home Health) (Generic) 2 x Per Day/30 Days Discharge Instructions: Apply moisturizing lotion as directed Prim Dressing: KerraCel Ag Gelling Fiber Dressing, 4x5 in (silver alginate) (Home Health) (Generic) 2 x Per Day/30 Days ary Discharge Instructions: Apply silver alginate to wound bed as instructed Secondary Dressing: Woven Gauze Sponge, Non-Sterile 4x4 in (Home Health) (Generic) 2 x Per Day/30 Days Discharge Instructions: Apply over primary dressing as directed. Secondary Dressing: ABD Pad, 5x9 (Home Health) 2 x Per Day/30 Days Discharge Instructions: Apply over primary dressing as directed. Compression Wrap: FourPress (4 layer compression wrap) (Home Health) (Generic) 2 x Per Day/30 Days Discharge Instructions: Apply four layer compression as directed. Wound #74 - Lower Leg Wound Laterality: Left, Lateral Cleanser: Normal Saline (Home Health) (Generic) 2 x Per Day/30 Days Discharge Instructions: Cleanse the wound with Normal Saline prior to applying a clean dressing using gauze sponges, not tissue or cotton balls. Peri-Wound Care: Sween Lotion (Moisturizing lotion) (Home Health) (Generic) 2 x Per Day/30 Days Discharge Instructions: Apply moisturizing lotion as directed Prim Dressing: KerraCel Ag Gelling Fiber Dressing, 4x5 in (silver alginate) (Home Health) (Generic) 2 x Per Day/30 Days ary Discharge Instructions: Apply silver alginate  to wound bed as instructed Secondary Dressing: Woven Gauze Sponge, Non-Sterile 4x4 in (Home Health) (Generic) 2 x Per Day/30 Days Discharge Instructions: Apply over primary dressing as directed. Secondary Dressing: ABD Pad, 5x9 (Home Health) 2 x Per Day/30 Days Discharge Instructions: Apply over primary dressing as directed. Compression Wrap: FourPress (4 layer compression wrap) (Home Health) (Generic) 2 x Per Day/30 Days Discharge Instructions: Apply four layer compression as directed. Wound #75 - Lower Leg Wound Laterality: Left, Posterior Cleanser: Normal Saline (Home Health) (Generic) 2 x Per Day/30 Days Discharge Instructions: Cleanse the wound with Normal Saline prior to applying a clean dressing using gauze sponges, not tissue or cotton balls. Peri-Wound Care: Sween Lotion (Moisturizing lotion) (Home Health) (Generic) 2 x Per Day/30 Days Discharge Instructions: Apply moisturizing lotion  as directed Prim Dressing: KerraCel Ag Gelling Fiber Dressing, 4x5 in (silver alginate) (Home Health) (Generic) 2 x Per Day/30 Days ary Discharge Instructions: Apply silver alginate to wound bed as instructed Secondary Dressing: Woven Gauze Sponge, Non-Sterile 4x4 in (Home Health) (Generic) 2 x Per Day/30 Days Discharge Instructions: Apply over primary dressing as directed. Secondary Dressing: ABD Pad, 5x9 (Home Health) 2 x Per Day/30 Days Discharge Instructions: Apply over primary dressing as directed. Compression Wrap: FourPress (4 layer compression wrap) (Home Health) (Generic) 2 x Per Day/30 Days Discharge Instructions: Apply four layer compression as directed. Wound #76 - Foot Wound Laterality: Dorsal, Left Cleanser: Normal Saline (Home Health) (Generic) 2 x Per Day/30 Days Discharge Instructions: Cleanse the wound with Normal Saline prior to applying a clean dressing using gauze sponges, not tissue or cotton balls. Peri-Wound Care: Sween Lotion (Moisturizing lotion) (Home Health) (Generic) 2 x Per  Day/30 Days Discharge Instructions: Apply moisturizing lotion as directed Prim Dressing: KerraCel Ag Gelling Fiber Dressing, 4x5 in (silver alginate) (Home Health) (Generic) 2 x Per Day/30 Days ary Discharge Instructions: Apply silver alginate to wound bed as instructed Secondary Dressing: Woven Gauze Sponge, Non-Sterile 4x4 in (Home Health) (Generic) 2 x Per Day/30 Days Discharge Instructions: Apply over primary dressing as directed. Secondary Dressing: ABD Pad, 5x9 (Home Health) 2 x Per Day/30 Days Discharge Instructions: Apply over primary dressing as directed. Secondary Dressing: add extra foam for protection to left dorsal foot wound 2 x Per Day/30 Days Compression Wrap: FourPress (4 layer compression wrap) (Home Health) (Generic) 2 x Per Day/30 Days Discharge Instructions: Apply four layer compression as directed. Electronic Signature(s) Signed: 10/26/2020 4:37:05 PM By: Linton Ham MD Signed: 10/26/2020 5:05:22 PM By: Rhae Hammock RN Entered By: Rhae Hammock on 10/26/2020 14:02:24 -------------------------------------------------------------------------------- Problem List Details Patient Name: Date of Service: [name] [name], [name] 10/26/2020 1:15 PM Medical Record Number: [id number] Patient Account Number: [id number] Date of Birth/Sex: Treating RN: 04-17-1956 (65 y.o. 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 10/26/2020 1:15 PM Medical Record Number: [id number] Patient Account Number: [id number] Date of Birth/Sex: Treating RN: 1956-06-22 (65 y.o. [name], Lauren Primary Care Provider: [name] Other Clinician: Referring Provider: Treating Provider/Extender: [name] in Treatment: 48 Subjective History of Present Illness (HPI) The following HPI elements were documented for the patient's wound: Location: both legs Quality: Patient reports No Pain. long history of chronic venous hypertension,chf,morbid obesity. s/p gsv ablation. cva 2oo4. sleep apnea andhbp. breakdown of skin both legs around 2 months ago. treated here for this in 2015. no .dm. 05/09/2016 -- he had his arterial studies done last week and his right ABI was 1.3 and his left ABI was 1.4. His right toe brachial index was 0.98 and  on the left was 0.9. Venous studies have only be done today and reports are awaited. 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 Donnetta Hutching. He had a arterial study done which showed a right ABI of 1.3 which is within normal limits at rest and a left ABI of 1.4 which is within normal limits at rest and may be falsely elevated. The right toe brachial index was 0.98 and the left toe brachial index was 0.90 05/30/2016 -- seen by Dr. Althea Charon -- is known to have a prior laser ablation of his left great  saphenous vein in 2009. Prior to that he had 2 ablations of the odd same vein by interventional radiology. As noted to have severe venous hypertension bilaterally. 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 His right great saphenous vein is somewhat dilated but no evidence of reflux. The only deep venous reflux demonstrated was in his left common femoral vein. After every consideration Dr. Donnetta Hutching recommended reattempt at ablation versus removal of his left great saphenous vein in the operating room with the standard vein stripping technique. The patient would consider this and let him know again. 06/20/16 patient continues to wear a juxtalite on the right leg without any open areas. On the lateral aspect of his left leg he has 4 wounds and a small area on the medial area of the left leg. Using silver alginate under Profore 06/27/16 still no open areas on the right leg. On the lateral aspect of his left leg he has 4 wounds which continue to have a nonviable surface and wheeze been using silver alginate under Profore 07/04/2016 -- patient hasn't yet to contact his vascular surgeon regarding plans for surgical intervention and I believe he is trying his best to avoid surgery. I have again discussed with him the futility of trying to heal this and keep it healed, if he does not agree to surgical intervention 07/11/2016 -- the patient has not had any juxta light ordered for at least 3 years and we will order him a pair. 08/08/2016 -- he has been approved for Apligraf and they will get this ready for him next week 08/15/2016 -- he has his first Apligraf applied today 08/29/2016 -- he has had his second application of Apligraf today 09/12/2016 -- his Apligraf has not arrived today due to the holiday 09/19/2016 -- he has had his third application of Apligraf today 10/03/2016 -- he has had his fourth application of Apligraf  today. 10/18/2016 -- his next Apligraf has not arrived today. He has had chronic problems with his back and was to start on steroids and I have told him there are no objections against this. He is also taking appropriate medications as per his orthopedic doctor. 10/24/2016 -- he is here for his fifth application of Apligraf today. 01/09/2017 -- is been having repeated falls and problems with his back and saw a spine surgeon who has recommended holding his anticoagulation and will have some epidural injections in a few weeks. 03/13/2017 - he had been doing very well with his left lower extremity and the ulcerations that come down significantly. However last week he may have hit himself against a metal cabinet and has started having abrasions and because of this has started weeping from the right lateral calf. He has not used  his compression since morning and his right lower extremity has markedly increased and lymphedema 03/27/17; the patient appears to be doing very well only a small cluster of wounds on the right lateral lower extremity. Most of the areas on his left anterior and left posterior leg are closed the wrong way to closing. His compression slipped down today there is irritation where the wrap edge was but no evidence of infection 04/24/2017 -- the patient has been using his lymphedema pumps and is also wearing his new compression on the right lower extremity. 05/01/2017 -- he has begun using his lymphedema pumps for a longer period of time but unfortunately had a fall and may have bruised his left lateral lower extremity under the 4-layer compression wrap and has multiple ulcerations in this area today 06/05/2017 -- after examination today he is noted to have taken a significant turn for the worse with multiple open ulcerations on his left lower calf and anterior leg. Lymphedema is better controlled and there is no evidence of cellulitis. I believe the patient is not being compliant with his  lymphedema pumps. 06/12/2017 -- the patient did not come for his nurse visit change to the left lower extremity on Friday, as advised. He has not been doing his compression appropriately and now has developed a ulcerated area on the right lower extremity. He also has not been using his lymphedema pumps appropriately. in addition to this the patient tells me that he and his wife are going to the beach this coming Sunday for over a week 06/26/2017 -- the patient is back after 2 weeks when he had gone on vacation and his treatment was substandard and he did not do his lymphedema pump. He does not have any systemic symptoms 08/07/2017 -- he kept his compression stockings all week and says he has been using his compression wraps on the right lower leg. He is also saying he is diligent with his lymphedema pumps. 08/21/17; using his lymphedema pumps about twice a week. He keeps his compression wraps on the left lower leg. He has his compression stocking on the right leg 12/14/18patient continues to be noncompliant with the lymphedema pumps. He has extremitease stockings on the right leg. He has a cluster of wounds on the left leg we have been using silver alginate 09/12/2017 -- over the Christmas holidays his right leg has become extremely large with lymphedema and weeping with ulceration and this is a huge step backward. I understand he has not been compliant with his diet or his lymphedema pumps 09/19/17 on evaluation today patient appears to be doing somewhat poorly due to the significant amount of fluid buildup in the right lower extremity especially. This has been somewhat macerated due to the fact that he is having so much drainage. No fevers, chills, nausea, or vomiting noted at this time. Patient has been tolerating the dressing changes but notes that it doesn't take very long for the weeping to build up. He has not been using his compression pumps for lymphedema unfortunately as I do feel like this  will be beneficial for him. No fevers, chills, nausea, or vomiting noted at this time. Patient has no evidence of dementia that is definitely noncompliant. 09/25/17; patient arrives with a lot of swelling in the right leg. Necrotic surface to the wound on the right lateral leg extending posteriorly. A lot of drainage and the right foot with maceration of the skin on the posterior right foot. There is smattering of wounds on the left  lateral leg anteriorly and laterally. The edema control here is much better. He is definitely noncompliant and tells me he uses a compression pumps at most twice a week 10/02/17; patient's major wound is on the right lateral leg extending posteriorly although this does not look worse than last week. Surface looks better. He has a small collection of wounds on the left lateral leg and anterior left lateral leg. Edema control is better he does not use his compression pumps. He looked somewhat short of breath 10/09/17; the major wound is on the right lateral leg covered in tightly adherent necrotic debris this week. Quite a bit different from last week. He has the usual constellation of small superficial areas on the left anterior and left lateral leg. We had been using silver alginate 10/16/17; the patient's major wound on the right lateral leg has a much better surfaces weak using Iodoflex. He has a constellation of small superficial areas on the left anterior and left lateral leg which are roughly unchanged. Noncompliant with his compression pumps using them perhaps once or twice per week 10/23/17; the patient's major wound is on the right lateral anterior lateral leg. Much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 There was a Four Layer Compression Therapy Procedure by Rhae Hammock, RN. Post procedure Diagnosis Wound #65: Same as Pre-Procedure Wound #68 Pre-procedure diagnosis of Wound #68 is a Lymphedema located on the Left,Proximal,Anterior Lower Leg . There was a Four Layer Compression Therapy Procedure by Rhae Hammock, RN. Post procedure Diagnosis Wound #68: Same as Pre-Procedure Wound #73 Pre-procedure diagnosis of Wound #73 is a Lymphedema located on the Right,Lateral Lower Leg . There was a Four Layer Compression Therapy Procedure by Rhae Hammock, RN. Post procedure Diagnosis Wound #73: Same as Pre-Procedure Wound #74 Pre-procedure diagnosis of Wound #74 is a Lymphedema located on the Left,Lateral Lower Leg . There was a Four Layer Compression Therapy Procedure by Rhae Hammock, RN. Post procedure Diagnosis Wound #74: Same as Pre-Procedure Wound #75 Pre-procedure diagnosis of Wound #75 is a Lymphedema located on the Left,Posterior Lower Leg . There was a Four Layer Compression Therapy Procedure by Rhae Hammock, RN. Post procedure Diagnosis  Wound #75: Same as Pre-Procedure Wound #76 Pre-procedure diagnosis of Wound #76 is a Lymphedema located on the Left,Dorsal Foot . There was a Four Layer Compression Therapy Procedure by Rhae Hammock, RN. Post procedure Diagnosis Wound #76: Same as Pre-Procedure Plan Follow-up Appointments: Return appointment in 3 weeks. Bathing/ Shower/ Hygiene: May shower with protection but do not get wound dressing(s) wet. Edema Control - Lymphedema / SCD / Other: Lymphedema Pumps. Use Lymphedema pumps on leg(s) 2-3 times a day for 45-60 minutes. If wearing any wraps or hose, do not remove them. Continue exercising as instructed. Elevate legs to the level of the heart or above for 30 minutes daily and/or when sitting, a frequency of: Avoid standing for long periods of time. Exercise regularly Home Health: No change in wound care orders this week; continue Home Health for wound care. May utilize formulary equivalent dressing for wound treatment orders unless otherwise specified. WOUND #65: - Lower Leg Wound Laterality: Left, Anterior, Distal Cleanser: Normal Saline (Home Health) (Generic) 2 x Per Day/30 Days Discharge Instructions: Cleanse the wound with Normal Saline prior to applying a clean dressing using gauze sponges, not tissue or cotton balls. Peri-Wound Care: Sween Lotion (Moisturizing lotion) (Home Health) (Generic) 2 x Per Day/30 Days Discharge Instructions: Apply moisturizing lotion as directed Prim Dressing: KerraCel Ag Gelling Fiber Dressing, 4x5 in (silver alginate) (Home Health) (Generic) 2 x Per Day/30 Days  ary Discharge Instructions: Apply silver alginate to wound bed as instructed Secondary Dressing: Woven Gauze Sponge, Non-Sterile 4x4 in (Home Health) (Generic) 2 x Per Day/30 Days Discharge Instructions: Apply over primary dressing as directed. Secondary Dressing: ABD Pad, 5x9 (Home Health) 2 x Per Day/30 Days Discharge Instructions: Apply over primary dressing as  directed. Com pression Wrap: FourPress (4 layer compression wrap) (Home Health) (Generic) 2 x Per Day/30 Days Discharge Instructions: Apply four layer compression as directed. WOUND #68: - Lower Leg Wound Laterality: Left, Anterior, Proximal Cleanser: Normal Saline (Home Health) (Generic) 2 x Per Day/30 Days Discharge Instructions: Cleanse the wound with Normal Saline prior to applying a clean dressing using gauze sponges, not tissue or cotton balls. Peri-Wound Care: Sween Lotion (Moisturizing lotion) (Home Health) (Generic) 2 x Per Day/30 Days Discharge Instructions: Apply moisturizing lotion as directed Prim Dressing: KerraCel Ag Gelling Fiber Dressing, 4x5 in (silver alginate) (Home Health) (Generic) 2 x Per Day/30 Days ary Discharge Instructions: Apply silver alginate to wound bed as instructed Secondary Dressing: Woven Gauze Sponge, Non-Sterile 4x4 in (Home Health) (Generic) 2 x Per Day/30 Days Discharge Instructions: Apply over primary dressing as directed. Secondary Dressing: ABD Pad, 5x9 (Home Health) 2 x Per Day/30 Days Discharge Instructions: Apply over primary dressing as directed. Com pression Wrap: FourPress (4 layer compression wrap) (Home Health) (Generic) 2 x Per Day/30 Days Discharge Instructions: Apply four layer compression as directed. WOUND #73: - Lower Leg Wound Laterality: Right, Lateral Cleanser: Normal Saline (Home Health) (Generic) 2 x Per Day/30 Days Discharge Instructions: Cleanse the wound with Normal Saline prior to applying a clean dressing using gauze sponges, not tissue or cotton balls. Peri-Wound Care: Sween Lotion (Moisturizing lotion) (Home Health) (Generic) 2 x Per Day/30 Days Discharge Instructions: Apply moisturizing lotion as directed Prim Dressing: KerraCel Ag Gelling Fiber Dressing, 4x5 in (silver alginate) (Home Health) (Generic) 2 x Per Day/30 Days ary Discharge Instructions: Apply silver alginate to wound bed as instructed Secondary Dressing:  Woven Gauze Sponge, Non-Sterile 4x4 in (Home Health) (Generic) 2 x Per Day/30 Days Discharge Instructions: Apply over primary dressing as directed. Secondary Dressing: ABD Pad, 5x9 (Home Health) 2 x Per Day/30 Days Discharge Instructions: Apply over primary dressing as directed. Com pression Wrap: FourPress (4 layer compression wrap) (Home Health) (Generic) 2 x Per Day/30 Days Discharge Instructions: Apply four layer compression as directed. WOUND #74: - Lower Leg Wound Laterality: Left, Lateral Cleanser: Normal Saline (Home Health) (Generic) 2 x Per Day/30 Days Discharge Instructions: Cleanse the wound with Normal Saline prior to applying a clean dressing using gauze sponges, not tissue or cotton balls. Peri-Wound Care: Sween Lotion (Moisturizing lotion) (Home Health) (Generic) 2 x Per Day/30 Days Discharge Instructions: Apply moisturizing lotion as directed Prim Dressing: KerraCel Ag Gelling Fiber Dressing, 4x5 in (silver alginate) (Home Health) (Generic) 2 x Per Day/30 Days ary Discharge Instructions: Apply silver alginate to wound bed as instructed Secondary Dressing: Woven Gauze Sponge, Non-Sterile 4x4 in (Home Health) (Generic) 2 x Per Day/30 Days Discharge Instructions: Apply over primary dressing as directed. Secondary Dressing: ABD Pad, 5x9 (Home Health) 2 x Per Day/30 Days Discharge Instructions: Apply over primary dressing as directed. Com pression Wrap: FourPress (4 layer compression wrap) (Home Health) (Generic) 2 x Per Day/30 Days Discharge Instructions: Apply four layer compression as directed. WOUND #75: - Lower Leg Wound Laterality: Left, Posterior Cleanser: Normal Saline (Home Health) (Generic) 2 x Per Day/30 Days Discharge Instructions: Cleanse the wound with Normal Saline prior to applying a clean dressing using  gauze sponges, not tissue or cotton balls. Peri-Wound Care: Sween Lotion (Moisturizing lotion) (Home Health) (Generic) 2 x Per Day/30 Days Discharge Instructions:  Apply moisturizing lotion as directed Prim Dressing: KerraCel Ag Gelling Fiber Dressing, 4x5 in (silver alginate) (Home Health) (Generic) 2 x Per Day/30 Days ary Discharge Instructions: Apply silver alginate to wound bed as instructed Secondary Dressing: Woven Gauze Sponge, Non-Sterile 4x4 in (Home Health) (Generic) 2 x Per Day/30 Days Discharge Instructions: Apply over primary dressing as directed. Secondary Dressing: ABD Pad, 5x9 (Home Health) 2 x Per Day/30 Days Discharge Instructions: Apply over primary dressing as directed. Com pression Wrap: FourPress (4 layer compression wrap) (Home Health) (Generic) 2 x Per Day/30 Days Discharge Instructions: Apply four layer compression as directed. WOUND #76: - Foot Wound Laterality: Dorsal, Left Cleanser: Normal Saline (Home Health) (Generic) 2 x Per Day/30 Days Discharge Instructions: Cleanse the wound with Normal Saline prior to applying a clean dressing using gauze sponges, not tissue or cotton balls. Peri-Wound Care: Sween Lotion (Moisturizing lotion) (Home Health) (Generic) 2 x Per Day/30 Days Discharge Instructions: Apply moisturizing lotion as directed Prim Dressing: KerraCel Ag Gelling Fiber Dressing, 4x5 in (silver alginate) (Home Health) (Generic) 2 x Per Day/30 Days ary Discharge Instructions: Apply silver alginate to wound bed as instructed Secondary Dressing: Woven Gauze Sponge, Non-Sterile 4x4 in (Home Health) (Generic) 2 x Per Day/30 Days Discharge Instructions: Apply over primary dressing as directed. Secondary Dressing: ABD Pad, 5x9 (Home Health) 2 x Per Day/30 Days Discharge Instructions: Apply over primary dressing as directed. Secondary Dressing: add extra foam for protection to left dorsal foot wound 2 x Per Day/30 Days Com pression Wrap: FourPress (4 layer compression wrap) (Home Health) (Generic) 2 x Per Day/30 Days Discharge Instructions: Apply four layer compression as directed. 1. I am continuing with silver alginate,  ABDs under 4-layer compression 2. He is says he is using his compression pumps 1 time a day on most days 3. I do not know that we are going to be able to he maintain healing or obtain healing in these legs I suspect this will be a long-term patient has we have already seen. 4. If he used his compression pumps 2 or 3 times a day I think we might get his edema control under better circumstances but he has never been willing to do it. In fact I think once a day is really a good result for him Electronic Signature(s) Signed: 10/26/2020 4:37:05 PM By: Linton Ham MD Entered By: Linton Ham on 10/26/2020 14:15:12 -------------------------------------------------------------------------------- SuperBill Details Patient Name: Date of Service: [name] [name], [name] 10/26/2020 Medical Record Number: [id number] Patient Account Number: [id number] Date of Birth/Sex: Treating RN: [birth year] (65 y.o. [name], Lauren Primary Care Provider: [name] Other Clinician: Referring Provider: Treating Provider/Extender: [name] in Treatment: 48 Diagnosis Coding ICD-10 Codes Code Description [id number] Chronic venous hypertension (idiopathic) with ulcer and inflammation of bilateral lower extremity I89.0 Lymphedema, not elsewhere classified L97.821 Non-pressure chronic ulcer of other part of left lower leg limited to breakdown of skin L97.811 Non-pressure chronic ulcer of other part of right lower leg limited to breakdown of skin L03.115 Cellulitis of right lower limb Facility Procedures CPT4: Code [id number] foot Description: 1 BILATERAL: Application of multi-layer venous compression system; leg (below knee), including ankle and . Modifier: Quantity: 1 Physician Procedures : CPT4 Code Description Modifier [id number] - WC PHYS LEVEL 3 - EST PT ICD-10 Diagnosis Description L97.821 Non-pressure chronic ulcer of other part of  left lower leg limited to breakdown of skin  L97.811 Non-pressure chronic ulcer of other part of  right lower leg limited to breakdown of skin I87.333 Chronic venous hypertension (idiopathic) with ulcer and inflammation of bilateral lower extremity I89.0 Lymphedema, not elsewhere classified Quantity: 1 Electronic Signature(s) Signed: 10/26/2020 4:37:05 PM By: Linton Ham MD Entered By: Linton Ham on 10/26/2020 14:15:43

## 2020-10-27 NOTE — Progress Notes (Signed)
[name] [name] ([id number]) , Visit Report for 10/26/2020 Arrival Information Details Patient Name: Date of Service: [name] Ro RDIN W. 10/26/2020 1:15 PM Medical Record Number: [id number] Patient Account Number: [id number] Date of Birth/Sex: Treating RN: 1955-10-02 (65 y.o. [name], Lauren Primary Care Gal Feldhaus: [name] Other Clinician: Referring Ladajah Soltys: Treating Cheralyn Oliver/Extender: [name] in Treatment: 11 Visit Information History Since Last Visit Added or deleted any medications: No Patient Arrived: [name]: No Arrival Time: 13:30 Had a fall or experienced change in No Accompanied By: self activities of daily living that may affect Transfer Assistance: None risk of falls: Patient Identification Verified: Yes Signs or symptoms of abuse/neglect since last visito No Secondary Verification Process Completed: Yes Hospitalized since last visit: No Patient Requires Transmission-Based Precautions: No Implantable device outside of the clinic excluding No Patient Has Alerts: Yes cellular tissue based products placed in the center Patient Alerts: R ABI = 1.25 since last visit: L ABI = 1.27 Has Dressing in Place as Prescribed: Yes Pain Present Now: No Electronic Signature(s) Signed: 10/27/2020 8:10:33 AM By: Sandre Kitty Entered By: Sandre Kitty on 10/26/2020 13:31:58 -------------------------------------------------------------------------------- Compression Therapy Details Patient Name: Date of Service: [name] Ro [place] W. 10/26/2020 1:15 PM Medical Record Number: [id number] Patient Account Number: [id number] Date of Birth/Sex: Treating RN: 1956-07-21 (65 y.o. [name], Lauren Primary Care Onnie Alatorre: [name] Other Clinician: Referring Mialee Weyman: Treating Rochanda Harpham/Extender: [name] in Treatment: 48 Compression Therapy Performed for Wound Assessment: Wound #65  Left,Distal,Anterior Lower Leg Performed By: Clinician Rhae Hammock, RN Compression Type: Four Layer Post Procedure Diagnosis Same as Pre-procedure Electronic Signature(s) Signed: 10/26/2020 5:05:22 PM By: Rhae Hammock RN Entered By: Rhae Hammock on 10/26/2020 14:05:54 -------------------------------------------------------------------------------- Compression Therapy Details Patient Name: Date of Service: [name] Ro RDIN W. 10/26/2020 1:15 PM Medical Record Number: [id number] Patient Account Number: [id number] Date of Birth/Sex: Treating RN: 10/22/1955 (65 y.o. [name], Lauren Primary Care Masato Pettie: [name] Other Clinician: Referring Piedad Standiford: Treating Shantanu Strauch/Extender: [name] in Treatment: 48 Compression Therapy Performed for Wound Assessment: Wound #68 Left,Proximal,Anterior Lower Leg Performed By: Clinician Rhae Hammock, RN Compression Type: Four Layer Post Procedure Diagnosis Same as Pre-procedure Electronic Signature(s) Signed: 10/26/2020 5:05:22 PM By: Rhae Hammock RN Entered By: Rhae Hammock on 10/26/2020 14:05:54 -------------------------------------------------------------------------------- Compression Therapy Details Patient Name: Date of Service: [name] Ro RDIN W. 10/26/2020 1:15 PM Medical Record Number: [id number] Patient Account Number: [id number] Date of Birth/Sex: Treating RN: [birth year] (65 y.o. [name], Lauren Primary Care Azyria Osmon: [name] Other Clinician: Referring Ming Kunka: Treating Alexande Sheerin/Extender: [name] in Treatment: 48 Compression Therapy Performed for Wound Assessment: Wound #73 Right,Lateral Lower Leg Performed By: Clinician Rhae Hammock, RN Compression Type: Four Layer Post Procedure Diagnosis Same as Pre-procedure Electronic Signature(s) Signed: 10/26/2020 5:05:22 PM By: Rhae Hammock RN Entered By: Rhae Hammock on  10/26/2020 14:05:54 -------------------------------------------------------------------------------- Compression Therapy Details Patient Name: Date of Service: [name] Ro RDIN W. 10/26/2020 1:15 PM Medical Record Number: [id number] Patient Account Number: [id number] Date of Birth/Sex: Treating RN: 1956-08-23 (65 y.o. [name] Primary Care Codie Krogh: [name] Other Clinician: Referring Everton Bertha: Treating Kashmir Lysaght/Extender: [name] in Treatment: 48 Compression Therapy Performed for Wound Assessment: Wound #74 Left,Lateral Lower Leg Performed By: Clinician Rhae Hammock, RN Compression Type: Four Layer Post Procedure Diagnosis Same as Pre-procedure Electronic Signature(s) Signed: 10/26/2020 5:05:22 PM By: Rhae Hammock RN Signed: 10/26/2020 5:05:22 PM By: Rhae Hammock RN Entered  By: Rhae Hammock on 10/26/2020 14:05:54 -------------------------------------------------------------------------------- Compression Therapy Details Patient Name: Date of Service: [name] Ro RDIN W. 10/26/2020 1:15 PM Medical Record Number: [id number] Patient Account Number: [id number] Date of Birth/Sex: Treating RN: September 21, 1955 (65 y.o. [name], Lauren Primary Care Ziare Cryder: [name] Other Clinician: Referring Reisha Wos: Treating Rich Paprocki/Extender: [name] in Treatment: 48 Compression Therapy Performed for Wound Assessment: Wound #75 Left,Posterior Lower Leg Performed By: Clinician Rhae Hammock, RN Compression Type: Four Layer Post Procedure Diagnosis Same as Pre-procedure Electronic Signature(s) Signed: 10/26/2020 5:05:22 PM By: Rhae Hammock RN Entered By: Rhae Hammock on 10/26/2020 14:05:54 -------------------------------------------------------------------------------- Compression Therapy Details Patient Name: Date of Service: [name] Ro RDIN W. 10/26/2020 1:15 PM Medical Record Number:  [id number] Patient Account Number: [id number] Date of Birth/Sex: Treating RN: July 13, 1956 (65 y.o. [name], Lauren Primary Care Lareta Bruneau: [name] Other Clinician: Referring Sady Monaco: Treating Azhar Knope/Extender: [name] in Treatment: 48 Compression Therapy Performed for Wound Assessment: Wound #76 Left,Dorsal Foot Performed By: Clinician Rhae Hammock, RN Compression Type: Four Layer Post Procedure Diagnosis Same as Pre-procedure Electronic Signature(s) Signed: 10/26/2020 5:05:22 PM By: Rhae Hammock RN Entered By: Rhae Hammock on 10/26/2020 14:05:54 -------------------------------------------------------------------------------- Encounter Discharge Information Details Patient Name: Date of Service: [name] [name], [name] RDIN W. 10/26/2020 1:15 PM Medical Record Number: [id number] Patient Account Number: [id number] Date of Birth/Sex: Treating RN: May 31, 1956 (65 y.o. [name] Primary Care Fallon Haecker: [name] Other Clinician: Referring Lewis Grivas: Treating Arlester Keehan/Extender: [name] in Treatment: 48 Encounter Discharge Information Items Discharge Condition: Stable Ambulatory Status: Cane Discharge Destination: Home Transportation: Private Auto Accompanied By: alone Schedule Follow-up Appointment: Yes Clinical Summary of Care: Patient Declined Electronic Signature(s) Signed: 10/26/2020 5:04:56 PM By: Levan Hurst RN, BSN Entered By: Levan Hurst on 10/26/2020 16:59:19 -------------------------------------------------------------------------------- Lower Extremity Assessment Details Patient Name: Date of Service: [name] Ro RDIN W. 10/26/2020 1:15 PM Medical Record Number: [id number] Patient Account Number: [id number] Date of Birth/Sex: Treating RN: 08-07-1956 (65 y.o. [name] Primary Care Easton Sivertson: [name] Other Clinician: Referring Idrees Quam: Treating Rane Blitch/Extender: [name] in Treatment: 48 Edema Assessment Assessed: [name]: No] [name]: No] Edema: [Left: Yes] [Right: Yes] Calf Left: Right: Point of Measurement: 30 cm From Medial Instep 40 cm 44.7 cm Ankle Left: Right: Point of Measurement: 13 cm From Medial Instep 25.5 cm 27 cm Vascular Assessment Pulses: Dorsalis Pedis Palpable: [Left:Yes] [Right:Yes] Electronic Signature(s) Signed: 10/26/2020 5:04:56 PM By: Levan Hurst RN, BSN Entered By: Levan Hurst on 10/26/2020 13:59:49 -------------------------------------------------------------------------------- Multi Wound Chart Details Patient Name: Date of Service: [name] [name], [name] RDIN W. 10/26/2020 1:15 PM Medical Record Number: [id number] Patient Account Number: [id number] Date of Birth/Sex: Treating RN: 1956-07-14 (64 y.o. [name], Lauren Primary Care Jamyah Folk: [name] Other Clinician: Referring Ellen Goris: Treating Rainen Vanrossum/Extender: [name] in Treatment: 6 Vital Signs Height(in): 70 Pulse(bpm): 70 Weight(lbs): 360 Blood Pressure(mmHg): 149/92 Body Mass Index(BMI): 52 Temperature(F): 97.4 Respiratory Rate(breaths/min): 18 Photos: [65:No Photos Left, Distal, Anterior Lower Leg] [68:No Photos Left, Proximal, Anterior Lower Leg] [72:No Photos Right, Anterior Lower Leg] Wound Location: [65:Gradually Appeared] [68:Gradually Appeared] [72:Gradually Appeared] Wounding Event: [65:Venous Leg Ulcer] [68:Lymphedema] [72:Lymphedema] Primary Etiology: [65:Lymphedema] [68:N/A] [72:N/A] Secondary Etiology: [65:Lymphedema, Sleep Apnea,] [68:Lymphedema, Sleep Apnea,] [72:Lymphedema, Sleep Apnea,] Comorbid History: [65:Congestive Heart Failure, Hypertension, Peripheral Venous Disease, Osteoarthritis 09/08/2019] [68:Congestive Heart Failure, Hypertension, Peripheral Venous Disease, Osteoarthritis 03/23/2020] [72:Congestive Heart Failure,  Hypertension, Peripheral Venous Disease, Osteoarthritis  08/17/2020] Date Acquired: [65:48] [68:31] [72:10] Weeks of Treatment: [65:Open] [  68:Open] [72:Healed - Epithelialized] Wound Status: [65:Yes] [68:No] [72:No] Clustered Wound: [65:1] [68:N/A] [72:N/A] Clustered Quantity: [65:0.3x0.3x0.1] [68:1x2x0.1] [72:0x0x0] Measurements L x W x D (cm) [65:0.071] [68:1.571] [72:0] A (cm) : rea [94:0.768] [68:0.157] [72:0] Volume (cm) : [65:100.00%] [68:13.40%] [72:100.00%] % Reduction in Area: [65:100.00%] [68:13.30%] [72:100.00%] % Reduction in Volume: [65:Full Thickness Without Exposed] [68:Full Thickness Without Exposed] [72:Full Thickness Without Exposed] Classification: [65:Support Structures Small] [68:Support Structures Medium] [72:Support Structures None Present] Exudate Amount: [65:Serosanguineous] [68:Serosanguineous] [72:N/A] Exudate Type: [65:red, brown] [68:red, brown] [72:N/A] Exudate Color: [65:Flat and Intact] [68:Flat and Intact] [72:Flat and Intact] Wound Margin: [65:Large (67-100%)] [68:Large (67-100%)] [72:None Present (0%)] Granulation Amount: [65:Red] [68:Red] [72:N/A] Granulation Quality: [65:None Present (0%)] [68:None Present (0%)] [72:None Present (0%)] Necrotic Amount: [65:Fat Layer (Subcutaneous Tissue): Yes Fat Layer (Subcutaneous Tissue): Yes Fascia: No] Exposed Structures: [65:Fascia: No Tendon: No Muscle: No Joint: No Bone: No Large (67-100%)] [68:Fascia: No Tendon: No Muscle: No Joint: No Bone: No Small (1-33%)] [72:Fat Layer (Subcutaneous Tissue): No Tendon: No Muscle: No Joint: No Bone: No Large (67-100%)] Epithelialization: [65:Compression Therapy] [68:Compression Therapy] [72:N/A] Wound Number: [id number] Photos: No Photos No Photos No Photos Right, Lateral Lower Leg Left, Lateral Lower Leg Left, Posterior Lower Leg Wound Location: Gradually Appeared Gradually Appeared Gradually Appeared Wounding Event: Lymphedema Lymphedema Lymphedema Primary Etiology: N/A N/A N/A Secondary Etiology: Lymphedema, Sleep Apnea,  Lymphedema, Sleep Apnea, Lymphedema, Sleep Apnea, Comorbid History: Congestive Heart Failure, Congestive Heart Failure, Congestive Heart Failure, Hypertension, Peripheral Venous Hypertension, Peripheral Venous Hypertension, Peripheral Venous Disease, Osteoarthritis Disease, Osteoarthritis Disease, Osteoarthritis 08/03/2020 09/28/2020 09/28/2020 Date Acquired: 10 4 4  Weeks of Treatment: Open Open Open Wound Status: No No No Clustered Wound: N/A N/A N/A Clustered Quantity: 2x1.7x0.1 1.5x0.9x0.1 2.8x1.1x0.1 Measurements L x W x D (cm) 2.67 1.06 2.419 A (cm) : rea 0.267 0.106 0.242 Volume (cm) : 96.60% -87.60% -388.70% % Reduction in Area: 96.60% 6.20% -393.90% % Reduction in Volume: Full Thickness Without Exposed Full Thickness Without Exposed Full Thickness Without Exposed Classification: Support Structures Support Structures Support Structures Medium Medium Medium Exudate Amount: Serosanguineous Serosanguineous Serosanguineous Exudate Type: red, brown red, brown red, brown Exudate Color: Flat and Intact Flat and Intact Flat and Intact Wound Margin: Large (67-100%) Large (67-100%) Large (67-100%) Granulation Amount: Red Red Red Granulation Quality: None Present (0%) None Present (0%) None Present (0%) Necrotic Amount: Fat Layer (Subcutaneous Tissue): Yes Fat Layer (Subcutaneous Tissue): Yes Fat Layer (Subcutaneous Tissue): Yes Exposed Structures: Fascia: No Fascia: No Fascia: No Tendon: No Tendon: No Tendon: No Muscle: No Muscle: No Muscle: No Joint: No Joint: No Joint: No Bone: No Bone: No Bone: No Medium (34-66%) Medium (34-66%) Small (1-33%) Epithelialization: Compression Therapy Compression Therapy Compression Therapy Procedures Performed: Wound Number: 35 N/A N/A Photos: No Photos N/A N/A Left, Dorsal Foot N/A N/A Wound Location: Gradually Appeared N/A N/A Wounding Event: Lymphedema N/A N/A Primary Etiology: N/A N/A N/A Secondary  Etiology: Lymphedema, Sleep Apnea, N/A N/A Comorbid History: Congestive Heart Failure, Hypertension, Peripheral Venous Disease, Osteoarthritis 09/28/2020 N/A N/A Date Acquired: 4 N/A N/A Weeks of Treatment: Open N/A N/A Wound Status: No N/A N/A Clustered Wound: N/A N/A N/A Clustered Quantity: 0.5x0.5x0.1 N/A N/A Measurements L x W x D (cm) 0.196 N/A N/A A (cm) : rea 0.02 N/A N/A Volume (cm) : 16.90% N/A N/A % Reduction in Area: 16.70% N/A N/A % Reduction in Volume: Full Thickness Without Exposed N/A N/A Classification: Support Structures Medium N/A N/A Exudate Amount: Serosanguineous N/A N/A Exudate Type: red, brown N/A N/A Exudate Color: Flat  and Intact N/A N/A Wound Margin: Large (67-100%) N/A N/A Granulation Amount: Pink N/A N/A Granulation Quality: Small (1-33%) N/A N/A Necrotic Amount: Fat Layer (Subcutaneous Tissue): Yes N/A N/A Exposed Structures: Fascia: No Tendon: No Muscle: No Joint: No Bone: No Small (1-33%) N/A N/A Epithelialization: Compression Therapy N/A N/A Procedures Performed: Treatment Notes Electronic Signature(s) Signed: 10/26/2020 4:37:05 PM By: Linton Ham MD Signed: 10/26/2020 5:05:22 PM By: Rhae Hammock RN Entered By: Linton Ham on 10/26/2020 14:10:23 -------------------------------------------------------------------------------- Multi-Disciplinary Care [name] Details Patient Name: Date of Service: [name] [name], [name] RDIN W. 10/26/2020 1:15 PM Medical Record Number: [id number] Patient Account Number: [id number] Date of Birth/Sex: Treating RN: 04-29-1956 (65 y.o. [name], Lauren Primary Care Patti Shorb: [name] Other Clinician: Referring Alizon Schmeling: Treating Onesha Krebbs/Extender: [name] in Treatment: 27 Active Inactive Wound/Skin Impairment Nursing Diagnoses: Knowledge deficit related to ulceration/compromised skin integrity Goals: Patient/caregiver will verbalize understanding of  skin care regimen Date Initiated: 11/25/2019 Target Resolution Date: 11/19/2020 Goal Status: Active Ulcer/skin breakdown will have a volume reduction of 30% by week 4 Date Initiated: 11/25/2019 Date Inactivated: 01/06/2020 Target Resolution Date: 12/26/2019 Goal Status: Met Ulcer/skin breakdown will have a volume reduction of 50% by week 8 Date Initiated: 01/06/2020 Date Inactivated: 02/17/2020 Target Resolution Date: 02/05/2020 Goal Status: Met Interventions: Assess patient/caregiver ability to obtain necessary supplies Assess patient/caregiver ability to perform ulcer/skin care regimen upon admission and as needed Assess ulceration(s) every visit Notes: Electronic Signature(s) Signed: 10/26/2020 5:05:22 PM By: Rhae Hammock RN Entered By: Rhae Hammock on 10/26/2020 13:57:49 -------------------------------------------------------------------------------- Pain Assessment Details Patient Name: Date of Service: [name] Ro [place] W. 10/26/2020 1:15 PM Medical Record Number: [id number] Patient Account Number: [id number] Date of Birth/Sex: Treating RN: [birth year] (65 y.o. [name], Lauren Primary Care Bentleigh Stankus: [name] Other Clinician: Referring Verona Hartshorn: Treating Urania Pearlman/Extender: [name] in Treatment: 48 Active Problems Location of Pain Severity and Description of Pain Patient Has Paino No Site Locations Pain Management and Medication Current Pain Management: Electronic Signature(s) Signed: 10/26/2020 5:05:22 PM By: Rhae Hammock RN Signed: 10/27/2020 8:10:33 AM By: Sandre Kitty Entered By: Sandre Kitty on 10/26/2020 13:32:17 -------------------------------------------------------------------------------- Patient/Caregiver Education Details Patient Name: Date of Service: [name] [name] 2/8/2022andnbsp1:15 PM Medical Record Number: [id number] Patient Account Number: [id number] Date of Birth/Gender: Treating RN: 1956-09-17 (65  y.o. [name] Primary Care Physician: [name] Other Clinician: Referring Physician: Treating Physician/Extender: [name] in Treatment: 49 Education Assessment Education Provided To: Patient Education Topics Provided Wound/Skin Impairment: Methods: Explain/Verbal Responses: State content correctly [employer]) Signed: 10/26/2020 5:05:22 PM By: Rhae Hammock RN Entered By: Rhae Hammock on 10/26/2020 13:58:04 -------------------------------------------------------------------------------- Wound Assessment Details Patient Name: Date of Service: [name] Ro RDIN W. 10/26/2020 1:15 PM Medical Record Number: [id number] Patient Account Number: [id number] Date of Birth/Sex: Treating RN: 1955-10-17 (65 y.o. [name], Lauren Primary Care Eyden Dobie: [name] Other Clinician: Referring Anahita Cua: Treating Janace Decker/Extender: [name] in Treatment: 61 Wound Status Wound Number: 65 Primary Venous Leg Ulcer Etiology: Wound Location: Left, Distal, Anterior Lower Leg Secondary Lymphedema Wounding Event: Gradually Appeared Etiology: Date Acquired: 09/08/2019 Wound Open Weeks Of Treatment: 48 Status: Clustered Wound: Yes Comorbid Lymphedema, Sleep Apnea, Congestive Heart Failure, History: Hypertension, Peripheral Venous Disease, Osteoarthritis Wound Measurements Length: (cm) 0.3 Width: (cm) 0.3 Depth: (cm) 0.1 Clustered Quantity: 1 Area: (cm) 0.071 Volume: (cm) 0.007 % Reduction in Area: 100% % Reduction in Volume: 100% Epithelialization: Large (67-100%) Tunneling: No Undermining: No Wound Description Classification: Full Thickness  Without Exposed Support Structures Wound Margin: Flat and Intact Exudate Amount: Small Exudate Type: Serosanguineous Exudate Color: red, brown Foul Odor After Cleansing: No Slough/Fibrino No Wound Bed Granulation Amount: Large (67-100%) Exposed  Structure Granulation Quality: Red Fascia Exposed: No Necrotic Amount: None Present (0%) Fat Layer (Subcutaneous Tissue) Exposed: Yes Tendon Exposed: No Muscle Exposed: No Joint Exposed: No Bone Exposed: No Treatment Notes Wound #65 (Lower Leg) Wound Laterality: Left, Anterior, Distal Cleanser Normal Saline Discharge Instruction: Cleanse the wound with Normal Saline prior to applying a clean dressing using gauze sponges, not tissue or cotton balls. Peri-Wound Care Sween Lotion (Moisturizing lotion) Discharge Instruction: Apply moisturizing lotion as directed Topical Primary Dressing KerraCel Ag Gelling Fiber Dressing, 4x5 in (silver alginate) Discharge Instruction: Apply silver alginate to wound bed as instructed Secondary Dressing Woven Gauze Sponge, Non-Sterile 4x4 in Discharge Instruction: Apply over primary dressing as directed. ABD Pad, 5x9 Discharge Instruction: Apply over primary dressing as directed. Secured With Compression Wrap FourPress (4 layer compression wrap) Discharge Instruction: Apply four layer compression as directed. Compression Stockings Add-Ons Electronic Signature(s) Signed: 10/26/2020 5:04:56 PM By: Levan Hurst RN, BSN Signed: 10/26/2020 5:05:22 PM By: Rhae Hammock RN Entered By: Levan Hurst on 10/26/2020 14:00:09 -------------------------------------------------------------------------------- Wound Assessment Details Patient Name: Date of Service: [name] [name], [name] RDIN W. 10/26/2020 1:15 PM Medical Record Number: [id number] Patient Account Number: [id number] Date of Birth/Sex: Treating RN: [birth year] (65 y.o. [name], Lauren Primary Care Wakeelah Solan: [name] Other Clinician: Referring Aneka Fagerstrom: Treating Dhruvi Crenshaw/Extender: [name] in Treatment: 45 Wound Status Wound Number: 68 Primary Lymphedema Etiology: Wound Location: Left, Proximal, Anterior Lower Leg Wound Open Wounding Event: Gradually  Appeared Status: Date Acquired: 03/23/2020 Comorbid Lymphedema, Sleep Apnea, Congestive Heart Failure, Weeks Of Treatment: 31 History: Hypertension, Peripheral Venous Disease, Osteoarthritis Clustered Wound: No Wound Measurements Length: (cm) 1 Width: (cm) 2 Depth: (cm) 0.1 Area: (cm) 1.571 Volume: (cm) 0.157 % Reduction in Area: 13.4% % Reduction in Volume: 13.3% Epithelialization: Small (1-33%) Tunneling: No Undermining: No Wound Description Classification: Full Thickness Without Exposed Support Structures Wound Margin: Flat and Intact Exudate Amount: Medium Exudate Type: Serosanguineous Exudate Color: red, brown Wound Bed Granulation Amount: Large (67-100%) Granulation Quality: Red Necrotic Amount: None Present (0%) Foul Odor After Cleansing: No Slough/Fibrino No Exposed Structure Fascia Exposed: No Fat Layer (Subcutaneous Tissue) Exposed: Yes Tendon Exposed: No Muscle Exposed: No Joint Exposed: No Bone Exposed: No Treatment Notes Wound #68 (Lower Leg) Wound Laterality: Left, Anterior, Proximal Cleanser Normal Saline Discharge Instruction: Cleanse the wound with Normal Saline prior to applying a clean dressing using gauze sponges, not tissue or cotton balls. Peri-Wound Care Sween Lotion (Moisturizing lotion) Discharge Instruction: Apply moisturizing lotion as directed Topical Primary Dressing KerraCel Ag Gelling Fiber Dressing, 4x5 in (silver alginate) Discharge Instruction: Apply silver alginate to wound bed as instructed Secondary Dressing Woven Gauze Sponge, Non-Sterile 4x4 in Discharge Instruction: Apply over primary dressing as directed. ABD Pad, 5x9 Discharge Instruction: Apply over primary dressing as directed. Secured With Compression Wrap FourPress (4 layer compression wrap) Discharge Instruction: Apply four layer compression as directed. Compression Stockings Add-Ons Electronic Signature(s) Signed: 10/26/2020 5:04:56 PM By: Levan Hurst RN,  BSN Signed: 10/26/2020 5:05:22 PM By: Rhae Hammock RN Entered By: Levan Hurst on 10/26/2020 14:00:33 -------------------------------------------------------------------------------- Wound Assessment Details Patient Name: Date of Service: [name] [name], [name] RDIN W. 10/26/2020 1:15 PM Medical Record Number: [id number] Patient Account Number: [id number] Date of Birth/Sex: Treating RN: 10-Jan-1956 (65 y.o. [name] Primary Care Modean Mccullum: [name] Other Clinician: Referring  Cenia Zaragosa: Treating Siniya Lichty/Extender: [name] in Treatment: 36 Wound Status Wound Number: 72 Primary Lymphedema Etiology: Wound Location: Right, Anterior Lower Leg Wound Healed - Epithelialized Wounding Event: Gradually Appeared Status: Date Acquired: 08/17/2020 Comorbid Lymphedema, Sleep Apnea, Congestive Heart Failure, Weeks Of Treatment: 10 History: Hypertension, Peripheral Venous Disease, Osteoarthritis Clustered Wound: No Wound Measurements Length: (cm) Width: (cm) Depth: (cm) Area: (cm) Volume: (cm) 0 % Reduction in Area: 100% 0 % Reduction in Volume: 100% 0 Epithelialization: Large (67-100%) 0 Undermining: No 0 Wound Description Classification: Full Thickness Without Exposed Support Structures Wound Margin: Flat and Intact Exudate Amount: None Present Foul Odor After Cleansing: No Slough/Fibrino No Wound Bed Granulation Amount: None Present (0%) Exposed Structure Necrotic Amount: None Present (0%) Fascia Exposed: No Fat Layer (Subcutaneous Tissue) Exposed: No Tendon Exposed: No Muscle Exposed: No Joint Exposed: No Bone Exposed: No Electronic Signature(s) Signed: 10/26/2020 5:04:56 PM By: Levan Hurst RN, BSN Signed: 10/26/2020 5:05:22 PM By: Rhae Hammock RN Entered By: Levan Hurst on 10/26/2020 14:00:57 -------------------------------------------------------------------------------- Wound Assessment Details Patient Name: Date of  Service: [name] [name], [name] [place] W. 10/26/2020 1:15 PM Medical Record Number: [id number] Patient Account Number: [id number] Date of Birth/Sex: Treating RN: [birth year] (65 y.o. [name], Lauren Primary Care Kingslee Dowse: [name] Other Clinician: Referring Reyaan Thoma: Treating Araly Kaas/Extender: [name] in Treatment: 19 Wound Status Wound Number: 73 Primary Lymphedema Etiology: Wound Location: Right, Lateral Lower Leg Wound Open Wounding Event: Gradually Appeared Status: Date Acquired: 08/03/2020 Comorbid Lymphedema, Sleep Apnea, Congestive Heart Failure, Weeks Of Treatment: 10 History: Hypertension, Peripheral Venous Disease, Osteoarthritis Clustered Wound: No Wound Measurements Length: (cm) 2 Width: (cm) 1.7 Depth: (cm) 0.1 Area: (cm) 2.67 Volume: (cm) 0.267 % Reduction in Area: 96.6% % Reduction in Volume: 96.6% Epithelialization: Medium (34-66%) Tunneling: No Undermining: No Wound Description Classification: Full Thickness Without Exposed Support Structures Wound Margin: Flat and Intact Exudate Amount: Medium Exudate Type: Serosanguineous Exudate Color: red, brown Foul Odor After Cleansing: No Slough/Fibrino No Wound Bed Granulation Amount: Large (67-100%) Exposed Structure Granulation Quality: Red Fascia Exposed: No Necrotic Amount: None Present (0%) Fat Layer (Subcutaneous Tissue) Exposed: Yes Tendon Exposed: No Muscle Exposed: No Joint Exposed: No Bone Exposed: No Treatment Notes Wound #73 (Lower Leg) Wound Laterality: Right, Lateral Cleanser Normal Saline Discharge Instruction: Cleanse the wound with Normal Saline prior to applying a clean dressing using gauze sponges, not tissue or cotton balls. Peri-Wound Care Sween Lotion (Moisturizing lotion) Discharge Instruction: Apply moisturizing lotion as directed Topical Primary Dressing KerraCel Ag Gelling Fiber Dressing, 4x5 in (silver alginate) Discharge Instruction: Apply silver  alginate to wound bed as instructed Secondary Dressing Woven Gauze Sponge, Non-Sterile 4x4 in Discharge Instruction: Apply over primary dressing as directed. ABD Pad, 5x9 Discharge Instruction: Apply over primary dressing as directed. Secured With Compression Wrap FourPress (4 layer compression wrap) Discharge Instruction: Apply four layer compression as directed. Compression Stockings Add-Ons Electronic Signature(s) Signed: 10/26/2020 5:04:56 PM By: Levan Hurst RN, BSN Signed: 10/26/2020 5:05:22 PM By: Rhae Hammock RN Entered By: Levan Hurst on 10/26/2020 14:01:18 -------------------------------------------------------------------------------- Wound Assessment Details Patient Name: Date of Service: [name] [name], [name] [place] W. 10/26/2020 1:15 PM Medical Record Number: [id number] Patient Account Number: [id number] Date of Birth/Sex: Treating RN: Feb 03, 1956 (64 y.o. [name] Primary Care Ottie Tillery: [name] Other Clinician: Referring Allis Quirarte: Treating Vibha Ferdig/Extender: [name] in Treatment: 48 Wound Status Wound Number: 74 Primary Lymphedema Etiology: Wound Location: Left, Lateral Lower Leg Wound Open Wounding Event: Gradually Appeared Status: Date Acquired: 09/28/2020  Comorbid Lymphedema, Sleep Apnea, Congestive Heart Failure, Weeks Of Treatment: 4 History: Hypertension, Peripheral Venous Disease, Osteoarthritis Clustered Wound: No Wound Measurements Length: (cm) 1.5 Width: (cm) 0.9 Depth: (cm) 0.1 Area: (cm) 1.06 Volume: (cm) 0.106 % Reduction in Area: -87.6% % Reduction in Volume: 6.2% Epithelialization: Medium (34-66%) Tunneling: No Undermining: No Wound Description Classification: Full Thickness Without Exposed Support Structures Wound Margin: Flat and Intact Exudate Amount: Medium Exudate Type: Serosanguineous Exudate Color: red, brown Foul Odor After Cleansing: No Slough/Fibrino No Wound Bed Granulation  Amount: Large (67-100%) Exposed Structure Granulation Quality: Red Fascia Exposed: No Necrotic Amount: None Present (0%) Fat Layer (Subcutaneous Tissue) Exposed: Yes Tendon Exposed: No Muscle Exposed: No Joint Exposed: No Bone Exposed: No Treatment Notes Wound #74 (Lower Leg) Wound Laterality: Left, Lateral Cleanser Normal Saline Discharge Instruction: Cleanse the wound with Normal Saline prior to applying a clean dressing using gauze sponges, not tissue or cotton balls. Peri-Wound Care Sween Lotion (Moisturizing lotion) Discharge Instruction: Apply moisturizing lotion as directed Topical Primary Dressing KerraCel Ag Gelling Fiber Dressing, 4x5 in (silver alginate) Discharge Instruction: Apply silver alginate to wound bed as instructed Secondary Dressing Woven Gauze Sponge, Non-Sterile 4x4 in Discharge Instruction: Apply over primary dressing as directed. ABD Pad, 5x9 Discharge Instruction: Apply over primary dressing as directed. Secured With Compression Wrap FourPress (4 layer compression wrap) Discharge Instruction: Apply four layer compression as directed. Compression Stockings Add-Ons Electronic Signature(s) Signed: 10/26/2020 5:04:56 PM By: Levan Hurst RN, BSN Signed: 10/26/2020 5:05:22 PM By: Rhae Hammock RN Entered By: Levan Hurst on 10/26/2020 14:01:40 -------------------------------------------------------------------------------- Wound Assessment Details Patient Name: Date of Service: [name] [name], [name] [place]IN W. 10/26/2020 1:15 PM Medical Record Number: [id number] Patient Account Number: [id number] Date of Birth/Sex: Treating RN: 1955/12/01 (65 y.o. [name], Lauren Primary Care Nihar Klus: [name] Other Clinician: Referring Brandilynn Taormina: Treating Charlee Squibb/Extender: [name] in Treatment: 4 Wound Status Wound Number: 75 Primary Lymphedema Etiology: Wound Location: Left, Posterior Lower Leg Wound Open Wounding Event: Gradually  Appeared Status: Date Acquired: 09/28/2020 Comorbid Lymphedema, Sleep Apnea, Congestive Heart Failure, Weeks Of Treatment: 4 History: Hypertension, Peripheral Venous Disease, Osteoarthritis Clustered Wound: No Wound Measurements Length: (cm) 2.8 Width: (cm) 1.1 Depth: (cm) 0.1 Area: (cm) 2.419 Volume: (cm) 0.242 % Reduction in Area: -388.7% % Reduction in Volume: -393.9% Epithelialization: Small (1-33%) Tunneling: No Undermining: No Wound Description Classification: Full Thickness Without Exposed Support Structures Wound Margin: Flat and Intact Exudate Amount: Medium Exudate Type: Serosanguineous Exudate Color: red, brown Foul Odor After Cleansing: No Slough/Fibrino No Wound Bed Granulation Amount: Large (67-100%) Exposed Structure Granulation Quality: Red Fascia Exposed: No Necrotic Amount: None Present (0%) Fat Layer (Subcutaneous Tissue) Exposed: Yes Tendon Exposed: No Muscle Exposed: No Joint Exposed: No Bone Exposed: No Treatment Notes Wound #75 (Lower Leg) Wound Laterality: Left, Posterior Cleanser Normal Saline Discharge Instruction: Cleanse the wound with Normal Saline prior to applying a clean dressing using gauze sponges, not tissue or cotton balls. Peri-Wound Care Sween Lotion (Moisturizing lotion) Discharge Instruction: Apply moisturizing lotion as directed Topical Primary Dressing KerraCel Ag Gelling Fiber Dressing, 4x5 in (silver alginate) Discharge Instruction: Apply silver alginate to wound bed as instructed Secondary Dressing Woven Gauze Sponge, Non-Sterile 4x4 in Discharge Instruction: Apply over primary dressing as directed. ABD Pad, 5x9 Discharge Instruction: Apply over primary dressing as directed. Secured With Compression Wrap FourPress (4 layer compression wrap) Discharge Instruction: Apply four layer compression as directed. Compression Stockings Add-Ons Electronic Signature(s) Signed: 10/26/2020 5:04:56 PM By: Levan Hurst RN,  BSN Signed: 10/26/2020 5:05:22  PM By: Rhae Hammock RN Entered By: Levan Hurst on 10/26/2020 14:02:03 -------------------------------------------------------------------------------- Wound Assessment Details Patient Name: Date of Service: [name] [place] RD[place] W. 10/26/2020 1:15 PM Medical Record Number: [id number] Patient Account Number: [id number] Date of Birth/Sex: Treating RN: [birth year] (65 y.o. [name], Lauren Primary Care Mavrik Bynum: Other Clinician: [name] Referring Kinzy Weyers: Treating Zell Doucette/Extender: [name] in Treatment: 63 Wound Status Wound Number: 76 Primary Lymphedema Etiology: Wound Location: Left, Dorsal Foot Wound Open Wounding Event: Gradually Appeared Status: Date Acquired: 09/28/2020 Comorbid Lymphedema, Sleep Apnea, Congestive Heart Failure, Weeks Of Treatment: 4 History: Hypertension, Peripheral Venous Disease, Osteoarthritis Clustered Wound: No Wound Measurements Length: (cm) 0.5 Width: (cm) 0.5 Depth: (cm) 0.1 Area: (cm) 0.196 Volume: (cm) 0.02 % Reduction in Area: 16.9% % Reduction in Volume: 16.7% Epithelialization: Small (1-33%) Tunneling: No Undermining: No Wound Description Classification: Full Thickness Without Exposed Support Structures Wound Margin: Flat and Intact Exudate Amount: Medium Exudate Type: Serosanguineous Exudate Color: red, brown Foul Odor After Cleansing: No Slough/Fibrino Yes Wound Bed Granulation Amount: Large (67-100%) Exposed Structure Granulation Quality: Pink Fascia Exposed: No Necrotic Amount: Small (1-33%) Fat Layer (Subcutaneous Tissue) Exposed: Yes Necrotic Quality: Adherent Slough Tendon Exposed: No Muscle Exposed: No Joint Exposed: No Bone Exposed: No Treatment Notes Wound #76 (Foot) Wound Laterality: Dorsal, Left Cleanser Normal Saline Discharge Instruction: Cleanse the wound with Normal Saline prior to applying a clean dressing using gauze sponges, not tissue  or cotton balls. Peri-Wound Care Sween Lotion (Moisturizing lotion) Discharge Instruction: Apply moisturizing lotion as directed Topical Primary Dressing KerraCel Ag Gelling Fiber Dressing, 4x5 in (silver alginate) Discharge Instruction: Apply silver alginate to wound bed as instructed Secondary Dressing Woven Gauze Sponge, Non-Sterile 4x4 in Discharge Instruction: Apply over primary dressing as directed. ABD Pad, 5x9 Discharge Instruction: Apply over primary dressing as directed. add extra foam for protection to left dorsal foot wound Secured With Compression Wrap FourPress (4 layer compression wrap) Discharge Instruction: Apply four layer compression as directed. Compression Stockings Add-Ons Electronic Signature(s) Signed: 10/26/2020 5:04:56 PM By: Levan Hurst RN, BSN Signed: 10/26/2020 5:05:22 PM By: Rhae Hammock RN Signed: 10/26/2020 5:05:22 PM By: Rhae Hammock RN Entered By: Levan Hurst on 10/26/2020 14:02:37 -------------------------------------------------------------------------------- Pax Details Patient Name: Date of Service: [name] [name], [place] W. 10/26/2020 1:15 PM Medical Record Number: [id number] Patient Account Number: [id number] Date of Birth/Sex: Treating RN: 1956-03-15 (65 y.o. [name], Lauren Primary Care Zyionna Pesce: [name] Other Clinician: Referring Lorelai Huyser: Treating Izaya Netherton/Extender: [name] in Treatment: 48 Vital Signs Time Taken: 13:31 Temperature (F): 97.4 Height (in): 70 Pulse (bpm): 68 Weight (lbs): 360 Respiratory Rate (breaths/min): 18 Body Mass Index (BMI): 51.6 Blood Pressure (mmHg): 149/92 Reference Range: 80 - 120 mg / dl Electronic Signature(s) Signed: 10/27/2020 8:10:33 AM By: Sandre Kitty Entered By: Sandre Kitty on 10/26/2020 13:32:12

## 2020-10-29 DIAGNOSIS — G4733 Obstructive sleep apnea (adult) (pediatric): Secondary | ICD-10-CM | POA: Diagnosis not present

## 2020-10-29 DIAGNOSIS — L97821 Non-pressure chronic ulcer of other part of left lower leg limited to breakdown of skin: Secondary | ICD-10-CM | POA: Diagnosis not present

## 2020-10-29 DIAGNOSIS — I110 Hypertensive heart disease with heart failure: Secondary | ICD-10-CM | POA: Diagnosis not present

## 2020-10-29 DIAGNOSIS — I87333 Chronic venous hypertension (idiopathic) with ulcer and inflammation of bilateral lower extremity: Secondary | ICD-10-CM | POA: Diagnosis not present

## 2020-10-29 DIAGNOSIS — I5032 Chronic diastolic (congestive) heart failure: Secondary | ICD-10-CM | POA: Diagnosis not present

## 2020-10-29 DIAGNOSIS — E669 Obesity, unspecified: Secondary | ICD-10-CM | POA: Diagnosis not present

## 2020-11-04 DIAGNOSIS — L97821 Non-pressure chronic ulcer of other part of left lower leg limited to breakdown of skin: Secondary | ICD-10-CM | POA: Diagnosis not present

## 2020-11-04 DIAGNOSIS — I5032 Chronic diastolic (congestive) heart failure: Secondary | ICD-10-CM | POA: Diagnosis not present

## 2020-11-04 DIAGNOSIS — I87333 Chronic venous hypertension (idiopathic) with ulcer and inflammation of bilateral lower extremity: Secondary | ICD-10-CM | POA: Diagnosis not present

## 2020-11-04 DIAGNOSIS — G4733 Obstructive sleep apnea (adult) (pediatric): Secondary | ICD-10-CM | POA: Diagnosis not present

## 2020-11-04 DIAGNOSIS — I110 Hypertensive heart disease with heart failure: Secondary | ICD-10-CM | POA: Diagnosis not present

## 2020-11-04 DIAGNOSIS — E669 Obesity, unspecified: Secondary | ICD-10-CM | POA: Diagnosis not present

## 2020-11-05 DIAGNOSIS — L97821 Non-pressure chronic ulcer of other part of left lower leg limited to breakdown of skin: Secondary | ICD-10-CM | POA: Diagnosis not present

## 2020-11-05 DIAGNOSIS — E669 Obesity, unspecified: Secondary | ICD-10-CM | POA: Diagnosis not present

## 2020-11-05 DIAGNOSIS — I5032 Chronic diastolic (congestive) heart failure: Secondary | ICD-10-CM | POA: Diagnosis not present

## 2020-11-05 DIAGNOSIS — I87333 Chronic venous hypertension (idiopathic) with ulcer and inflammation of bilateral lower extremity: Secondary | ICD-10-CM | POA: Diagnosis not present

## 2020-11-05 DIAGNOSIS — I110 Hypertensive heart disease with heart failure: Secondary | ICD-10-CM | POA: Diagnosis not present

## 2020-11-05 DIAGNOSIS — G4733 Obstructive sleep apnea (adult) (pediatric): Secondary | ICD-10-CM | POA: Diagnosis not present

## 2020-11-12 DIAGNOSIS — I5032 Chronic diastolic (congestive) heart failure: Secondary | ICD-10-CM | POA: Diagnosis not present

## 2020-11-12 DIAGNOSIS — L97821 Non-pressure chronic ulcer of other part of left lower leg limited to breakdown of skin: Secondary | ICD-10-CM | POA: Diagnosis not present

## 2020-11-12 DIAGNOSIS — I87333 Chronic venous hypertension (idiopathic) with ulcer and inflammation of bilateral lower extremity: Secondary | ICD-10-CM | POA: Diagnosis not present

## 2020-11-12 DIAGNOSIS — G4733 Obstructive sleep apnea (adult) (pediatric): Secondary | ICD-10-CM | POA: Diagnosis not present

## 2020-11-12 DIAGNOSIS — I110 Hypertensive heart disease with heart failure: Secondary | ICD-10-CM | POA: Diagnosis not present

## 2020-11-12 DIAGNOSIS — E669 Obesity, unspecified: Secondary | ICD-10-CM | POA: Diagnosis not present

## 2020-11-13 DIAGNOSIS — E669 Obesity, unspecified: Secondary | ICD-10-CM | POA: Diagnosis not present

## 2020-11-13 DIAGNOSIS — Z9981 Dependence on supplemental oxygen: Secondary | ICD-10-CM | POA: Diagnosis not present

## 2020-11-13 DIAGNOSIS — I87333 Chronic venous hypertension (idiopathic) with ulcer and inflammation of bilateral lower extremity: Secondary | ICD-10-CM | POA: Diagnosis not present

## 2020-11-13 DIAGNOSIS — I110 Hypertensive heart disease with heart failure: Secondary | ICD-10-CM | POA: Diagnosis not present

## 2020-11-13 DIAGNOSIS — L97821 Non-pressure chronic ulcer of other part of left lower leg limited to breakdown of skin: Secondary | ICD-10-CM | POA: Diagnosis not present

## 2020-11-13 DIAGNOSIS — G4733 Obstructive sleep apnea (adult) (pediatric): Secondary | ICD-10-CM | POA: Diagnosis not present

## 2020-11-13 DIAGNOSIS — I5032 Chronic diastolic (congestive) heart failure: Secondary | ICD-10-CM | POA: Diagnosis not present

## 2020-11-13 DIAGNOSIS — Z8673 Personal history of transient ischemic attack (TIA), and cerebral infarction without residual deficits: Secondary | ICD-10-CM | POA: Diagnosis not present

## 2020-11-16 ENCOUNTER — Encounter (HOSPITAL_BASED_OUTPATIENT_CLINIC_OR_DEPARTMENT_OTHER): Payer: Medicare Other | Attending: Internal Medicine | Admitting: Internal Medicine

## 2020-11-16 ENCOUNTER — Other Ambulatory Visit: Payer: Self-pay

## 2020-11-16 DIAGNOSIS — L97822 Non-pressure chronic ulcer of other part of left lower leg with fat layer exposed: Secondary | ICD-10-CM | POA: Diagnosis not present

## 2020-11-16 DIAGNOSIS — L97812 Non-pressure chronic ulcer of other part of right lower leg with fat layer exposed: Secondary | ICD-10-CM | POA: Diagnosis not present

## 2020-11-16 DIAGNOSIS — L97811 Non-pressure chronic ulcer of other part of right lower leg limited to breakdown of skin: Secondary | ICD-10-CM | POA: Diagnosis not present

## 2020-11-16 DIAGNOSIS — L97821 Non-pressure chronic ulcer of other part of left lower leg limited to breakdown of skin: Secondary | ICD-10-CM | POA: Diagnosis not present

## 2020-11-16 DIAGNOSIS — I890 Lymphedema, not elsewhere classified: Secondary | ICD-10-CM | POA: Diagnosis not present

## 2020-11-16 DIAGNOSIS — Z6841 Body Mass Index (BMI) 40.0 and over, adult: Secondary | ICD-10-CM | POA: Diagnosis not present

## 2020-11-16 DIAGNOSIS — L97222 Non-pressure chronic ulcer of left calf with fat layer exposed: Secondary | ICD-10-CM | POA: Diagnosis not present

## 2020-11-16 DIAGNOSIS — I87333 Chronic venous hypertension (idiopathic) with ulcer and inflammation of bilateral lower extremity: Secondary | ICD-10-CM | POA: Insufficient documentation

## 2020-11-16 NOTE — Progress Notes (Signed)
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 Not surprisingly comes back in with wounds on his bilateral legs at the level of where his external  compression garment was tight superiorly. He tells me he is using his pumps once every second day. 7/20; 2-week follow-up the patient has not been using his compression pumps his wife was transferred to a new nursing home. He has 1 wound on the medial left lower leg and a small area on the right lateral 8/17; patient arrives today with only a smattering of small wounds on the left anterior lower leg most of these are eschared and look benign. There is nothing on his right leg. We have been using silver alginate under 4-layer compression 8/31; patient's wounds on the left anterior lower leg are larger. There is still nothing open on the right toe although we did put compression wraps on this last time. He has home health. Says he is used his compression pumps twice in the last 3 days. Obviously not sufficient 9/14; 2-week follow-up. We discharged him in his own zippered stocking. Apparently home health helped him change this and noted weeping edema fluid therefore put him back in a  wrap he arrives in the clinic today with no open wounds on the right innumerable small broken areas on the anterior left calf. Uncontrolled edema above the level of the wrap compatible with lymphedema 9/27 2-week follow-up. He arrives today with the left anterior lower extremity looking better. On the right he has a small open area posteriorly. I am going to put him back in compression on both sides. He claims compliance with his compression pumps at home twice a day he has home health changing his dressing 10/26; 1 month follow-up he has home health changing his dressings there is no open wound on the right leg he has extremitease stockings and external compression pumps which he says he is using once a day. The left leg is always been a more difficult site and although it is difficult to identify any precise open wound he has several leaking areas especially anteriorly and superiorly and at the edge of his wraps 11/9; 2-week follow-up. He has his extremities stocking on the right. He says he has been using his compression pumps once a day. He has 1 remaining wound on the left anterior mid tibia which looks healthy his edema control is otherwise good on the left 11/30; there is now a 3-week follow-up. We use his own compression stocking on the right last time as he has no open wounds. He apparently developed a UTI received a course of ciprofloxacin. He did not wear his compression pumps and I wonder whether he actually was using his stocking. Home health put a compression wrap on his right leg last week. He has multiple small open areas on the left anterior leg this week. 12/7; still a cluster of small areas on the left lateral calf and the right lateral calf. He has home health changing his dressings. For some reason he will not use compression pumps reliably this week because his wife spent 2 days in the hospital with anemia 12/21; he continues to have a cluster of small areas predominantly on the  left lateral but also the left anterior lower leg. More defined wounds on the right anterior and right medial. The he says he is using the compression pumps every other day. I have never understood the issue for this noncompliant. He still has home health coming out 1/11 the patient continues to have multiple small open areas on the left lateral left anterior and medial lower leg anteriorly. He also has a small punched-out painful area in  the crease of his left ankle which I think is probably a rapid injury. Very tender. I debrided in this area very adherent debris. Also small areas on the right lateral calf. We have been using silver alginate. His compression pump usage is probably marginal 2/8; the patient comes back in with his legs essentially in the same condition. He has multiple small areas on his bilateral lower extremities left greater than right anteriorly and posteriorly. A lot of these have small eschars on top some of them do not. His edema control is marginal. He says he uses his pumps once a day. He has home health changing his dressing 4-layer compression silver alginate as the primary dressing 3/1; Marked deterioration this visit. He has multiple small areas across the entirety of the lateral anterior and medial left lower leg. I am actually concerned that he may be on his way to a more substantive degree of breakdown in this area. He is using his compression pumps 4 times per week. He has home health changing this dressing week Electronic Signature(s) Signed: 11/16/2020 5:59:48 PM By: Linton Ham MD Entered By: Linton Ham on 11/16/2020 14:01:44 -------------------------------------------------------------------------------- Physical Exam Details Patient Name: Date of Service: [name] [name], [name] RDIN W. 11/16/2020 1:00 PM Medical Record Number: [id number] Patient Account Number: [id number] Date of Birth/Sex: Treating RN: [birth year] (65 y.o. [name] [name] Primary Care  Provider: Consuello [name] Other Clinician: Referring Provider: Treating Provider/Extender: [name] [name] in Treatment: 51 Cardiovascular Needle pulses are palpable. Edema control is marginal at best. Notes Wound exam; bilateral left greater than right lower leg wounds. The area on the left is a multitude of small open areas medially anteriorly and a larger area laterally. I am concerned that he could be on his way to circumferential breakdown. On the anterior and lateral part of the right leg there is more individual venous ulcerations Electronic Signature(s) Signed: 11/16/2020 5:59:48 PM By: Linton Ham MD Entered By: Linton Ham on 11/16/2020 14:02:51 -------------------------------------------------------------------------------- Physician Orders Details Patient Name: Date of Service: [name] [name], [name] RDIN W. 11/16/2020 1:00 PM Medical Record Number: [id number] Patient Account Number: [id number] Date of Birth/Sex: Treating RN: 1956-04-14 (65 y.o. [name] [name] Primary Care Provider: Consuello [name] Other Clinician: Referring Provider: Treating Provider/Extender: [name] [name] in Treatment: 24 Verbal / Phone Orders: No Diagnosis Coding Follow-up Appointments Return appointment in 1 month. Bathing/ Shower/ Hygiene May shower with protection but do not get wound dressing(s) wet. Edema Control - Lymphedema / SCD / Other Lymphedema Pumps. Use Lymphedema pumps on leg(s) 2-3 times a day for 45-60 minutes. If wearing any wraps or hose, do not remove them. Continue exercising as instructed. Elevate legs to the level of the heart or above for 30 minutes daily and/or when sitting, a frequency of: Avoid standing for long periods of time. Exercise regularly Shields wound care orders this week; continue Home Health for wound care. May utilize formulary equivalent dressing for wound treatment orders unless otherwise specified. - home health  to change 2x a week {if possible Tuesday's and Friday's} Wound Treatment Wound #65 - Lower Leg Wound Laterality: Left, Anterior, Distal Cleanser: Normal Saline (Home Health) (Generic) 2 x Per Week/30 Days Discharge Instructions: Cleanse the wound with Normal Saline prior to applying a clean dressing using gauze sponges, not tissue or cotton balls. Peri-Wound Care: Sween Lotion (Moisturizing lotion) (Home Health) (Generic) 2 x Per Week/30 Days Discharge Instructions: Apply moisturizing lotion as directed Prim Dressing: KerraCel Ag Gelling Fiber  Dressing, 4x5 in (silver alginate) (Home Health) (Generic) 2 x Per Week/30 Days ary Discharge Instructions: Apply silver alginate to wound bed as instructed Secondary Dressing: Woven Gauze Sponge, Non-Sterile 4x4 in (Home Health) (Generic) 2 x Per Week/30 Days Discharge Instructions: Apply over primary dressing as directed. Secondary Dressing: ABD Pad, 5x9 (Home Health) 2 x Per Week/30 Days Discharge Instructions: Apply over primary dressing as directed. Compression Wrap: FourPress (4 layer compression wrap) (Home Health) (Generic) 2 x Per Week/30 Days Discharge Instructions: Apply four layer compression as directed. Wound #68 - Lower Leg Wound Laterality: Left, Anterior, Proximal Cleanser: Normal Saline (Home Health) (Generic) 2 x Per Week/30 Days Discharge Instructions: Cleanse the wound with Normal Saline prior to applying a clean dressing using gauze sponges, not tissue or cotton balls. Peri-Wound Care: Sween Lotion (Moisturizing lotion) (Home Health) (Generic) 2 x Per Week/30 Days Discharge Instructions: Apply moisturizing lotion as directed Prim Dressing: KerraCel Ag Gelling Fiber Dressing, 4x5 in (silver alginate) (Home Health) (Generic) 2 x Per Week/30 Days ary Discharge Instructions: Apply silver alginate to wound bed as instructed Secondary Dressing: Woven Gauze Sponge, Non-Sterile 4x4 in (Home Health) (Generic) 2 x Per Week/30 Days Discharge  Instructions: Apply over primary dressing as directed. Secondary Dressing: ABD Pad, 5x9 (Home Health) 2 x Per Week/30 Days Discharge Instructions: Apply over primary dressing as directed. Compression Wrap: FourPress (4 layer compression wrap) (Home Health) (Generic) 2 x Per Week/30 Days Discharge Instructions: Apply four layer compression as directed. Wound #73 - Lower Leg Wound Laterality: Right, Lateral Cleanser: Normal Saline (Home Health) (Generic) 2 x Per Week/30 Days Discharge Instructions: Cleanse the wound with Normal Saline prior to applying a clean dressing using gauze sponges, not tissue or cotton balls. Peri-Wound Care: Sween Lotion (Moisturizing lotion) (Home Health) (Generic) 2 x Per Week/30 Days Discharge Instructions: Apply moisturizing lotion as directed Prim Dressing: KerraCel Ag Gelling Fiber Dressing, 4x5 in (silver alginate) (Home Health) (Generic) 2 x Per Week/30 Days ary Discharge Instructions: Apply silver alginate to wound bed as instructed Secondary Dressing: Woven Gauze Sponge, Non-Sterile 4x4 in (Home Health) (Generic) 2 x Per Week/30 Days Discharge Instructions: Apply over primary dressing as directed. Secondary Dressing: ABD Pad, 5x9 (Home Health) 2 x Per Week/30 Days Discharge Instructions: Apply over primary dressing as directed. Compression Wrap: FourPress (4 layer compression wrap) (Home Health) (Generic) 2 x Per Week/30 Days Discharge Instructions: Apply four layer compression as directed. Wound #74 - Lower Leg Wound Laterality: Left, Lateral Cleanser: Normal Saline (Home Health) (Generic) 2 x Per Week/30 Days Discharge Instructions: Cleanse the wound with Normal Saline prior to applying a clean dressing using gauze sponges, not tissue or cotton balls. Peri-Wound Care: Sween Lotion (Moisturizing lotion) (Home Health) (Generic) 2 x Per Week/30 Days Discharge Instructions: Apply moisturizing lotion as directed Prim Dressing: KerraCel Ag Gelling Fiber Dressing,  4x5 in (silver alginate) (Home Health) (Generic) 2 x Per Week/30 Days ary Discharge Instructions: Apply silver alginate to wound bed as instructed Secondary Dressing: Woven Gauze Sponge, Non-Sterile 4x4 in (Home Health) (Generic) 2 x Per Week/30 Days Discharge Instructions: Apply over primary dressing as directed. Secondary Dressing: ABD Pad, 5x9 (Home Health) 2 x Per Week/30 Days Discharge Instructions: Apply over primary dressing as directed. Compression Wrap: FourPress (4 layer compression wrap) (Home Health) (Generic) 2 x Per Week/30 Days Discharge Instructions: Apply four layer compression as directed. Wound #75 - Lower Leg Wound Laterality: Left, Posterior Cleanser: Normal Saline (Home Health) (Generic) 2 x Per Week/30 Days Discharge Instructions: Cleanse the wound with  Normal Saline prior to applying a clean dressing using gauze sponges, not tissue or cotton balls. Peri-Wound Care: Sween Lotion (Moisturizing lotion) (Home Health) (Generic) 2 x Per Week/30 Days Discharge Instructions: Apply moisturizing lotion as directed Prim Dressing: KerraCel Ag Gelling Fiber Dressing, 4x5 in (silver alginate) (Home Health) (Generic) 2 x Per Week/30 Days ary Discharge Instructions: Apply silver alginate to wound bed as instructed Secondary Dressing: Woven Gauze Sponge, Non-Sterile 4x4 in (Home Health) (Generic) 2 x Per Week/30 Days Discharge Instructions: Apply over primary dressing as directed. Secondary Dressing: ABD Pad, 5x9 (Home Health) 2 x Per Week/30 Days Discharge Instructions: Apply over primary dressing as directed. Compression Wrap: FourPress (4 layer compression wrap) (Home Health) (Generic) 2 x Per Week/30 Days Discharge Instructions: Apply four layer compression as directed. Wound #76 - Foot Wound Laterality: Dorsal, Left Cleanser: Normal Saline (Home Health) (Generic) 2 x Per Week/30 Days Discharge Instructions: Cleanse the wound with Normal Saline prior to applying a clean dressing  using gauze sponges, not tissue or cotton balls. Peri-Wound Care: Sween Lotion (Moisturizing lotion) (Home Health) (Generic) 2 x Per Week/30 Days Discharge Instructions: Apply moisturizing lotion as directed Prim Dressing: KerraCel Ag Gelling Fiber Dressing, 4x5 in (silver alginate) (Home Health) (Generic) 2 x Per Week/30 Days ary Discharge Instructions: Apply silver alginate to wound bed as instructed Secondary Dressing: Woven Gauze Sponge, Non-Sterile 4x4 in (Home Health) (Generic) 2 x Per Week/30 Days Discharge Instructions: Apply over primary dressing as directed. Secondary Dressing: ABD Pad, 5x9 (Home Health) 2 x Per Week/30 Days Discharge Instructions: Apply over primary dressing as directed. Compression Wrap: FourPress (4 layer compression wrap) (Home Health) (Generic) 2 x Per Week/30 Days Discharge Instructions: Apply four layer compression as directed. Wound #77 - Lower Leg Wound Laterality: Left, Lateral, Proximal Cleanser: Normal Saline (Home Health) (Generic) 2 x Per Week/30 Days Discharge Instructions: Cleanse the wound with Normal Saline prior to applying a clean dressing using gauze sponges, not tissue or cotton balls. Peri-Wound Care: Sween Lotion (Moisturizing lotion) (Home Health) (Generic) 2 x Per Week/30 Days Discharge Instructions: Apply moisturizing lotion as directed Prim Dressing: KerraCel Ag Gelling Fiber Dressing, 4x5 in (silver alginate) (Home Health) (Generic) 2 x Per Week/30 Days ary Discharge Instructions: Apply silver alginate to wound bed as instructed Secondary Dressing: Woven Gauze Sponge, Non-Sterile 4x4 in (Home Health) (Generic) 2 x Per Week/30 Days Discharge Instructions: Apply over primary dressing as directed. Secondary Dressing: ABD Pad, 5x9 (Home Health) 2 x Per Week/30 Days Discharge Instructions: Apply over primary dressing as directed. Compression Wrap: FourPress (4 layer compression wrap) (Home Health) (Generic) 2 x Per Week/30 Days Discharge  Instructions: Apply four layer compression as directed. Wound #78 - Lower Leg Wound Laterality: Right, Posterior Cleanser: Normal Saline (Home Health) (Generic) 2 x Per Week/30 Days Discharge Instructions: Cleanse the wound with Normal Saline prior to applying a clean dressing using gauze sponges, not tissue or cotton balls. Peri-Wound Care: Sween Lotion (Moisturizing lotion) (Home Health) (Generic) 2 x Per Week/30 Days Discharge Instructions: Apply moisturizing lotion as directed Prim Dressing: KerraCel Ag Gelling Fiber Dressing, 4x5 in (silver alginate) (Home Health) (Generic) 2 x Per Week/30 Days ary Discharge Instructions: Apply silver alginate to wound bed as instructed Secondary Dressing: Woven Gauze Sponge, Non-Sterile 4x4 in (Home Health) (Generic) 2 x Per Week/30 Days Discharge Instructions: Apply over primary dressing as directed. Secondary Dressing: ABD Pad, 5x9 (Home Health) 2 x Per Week/30 Days Discharge Instructions: Apply over primary dressing as directed. Compression Wrap: FourPress (4 layer compression wrap) (  Home Health) (Generic) 2 x Per Week/30 Days Discharge Instructions: Apply four layer compression as directed. Wound #79 - Lower Leg Wound Laterality: Left, Medial Cleanser: Normal Saline (Home Health) (Generic) 2 x Per Week/30 Days Discharge Instructions: Cleanse the wound with Normal Saline prior to applying a clean dressing using gauze sponges, not tissue or cotton balls. Peri-Wound Care: Sween Lotion (Moisturizing lotion) (Home Health) (Generic) 2 x Per Week/30 Days Discharge Instructions: Apply moisturizing lotion as directed Prim Dressing: KerraCel Ag Gelling Fiber Dressing, 4x5 in (silver alginate) (Home Health) (Generic) 2 x Per Week/30 Days ary Discharge Instructions: Apply silver alginate to wound bed as instructed Secondary Dressing: Woven Gauze Sponge, Non-Sterile 4x4 in (Home Health) (Generic) 2 x Per Week/30 Days Discharge Instructions: Apply over primary  dressing as directed. Secondary Dressing: ABD Pad, 5x9 (Home Health) 2 x Per Week/30 Days Discharge Instructions: Apply over primary dressing as directed. Compression Wrap: FourPress (4 layer compression wrap) (Home Health) (Generic) 2 x Per Week/30 Days Discharge Instructions: Apply four layer compression as directed. Electronic Signature(s) Signed: 11/16/2020 5:58:27 PM By: Rhae Hammock RN Signed: 11/16/2020 5:59:48 PM By: Linton Ham MD Entered By: Rhae Hammock on 11/16/2020 13:58:04 -------------------------------------------------------------------------------- Problem List Details Patient Name: Date of Service: [name] [name], [name] RDIN W. 11/16/2020 1:00 PM Medical Record Number: [id number] Patient Account Number: [id number] Date of Birth/Sex: Treating RN: 02-24-1956 (65 y.o. 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 11/16/2020 1:00 PM Medical  Record Number: [id number] Patient Account Number: [id number] Date of Birth/Sex: Treating RN: 07/01/1956 (65 y.o. [name], Lauren Primary Care Provider: Consuello [name] Other Clinician: Referring Provider: Treating Provider/Extender: [name] [name] in Treatment: 63 Subjective History of Present Illness (HPI) The following HPI elements were documented for the patient's wound: Location: both legs Quality: Patient reports No Pain. long history of chronic venous hypertension,chf,morbid obesity. s/p gsv ablation. cva 2oo4. sleep apnea andhbp. breakdown of skin both legs around 2 months ago. treated here for this in 2015. no .dm. 05/09/2016 -- he had his arterial studies done last week and his right ABI was 1.3 and his left ABI was 1.4. His right toe brachial index was 0.98 and on the left was 0.9. Venous studies have only be done today and reports are awaited. 05/16/2016 -- had a lower extremity venous duplex reflux evaluation which showed venous incompetence noted in the left great saphenous and common femoral veins and a vascular surgery consult was recommended by [name] [name]. He had a arterial study done which showed a right ABI of 1.3 which is within normal limits at rest and a left ABI of 1.4 which is within normal limits at rest and may be falsely elevated. The right toe brachial index  was 0.98 and the left toe brachial index was 0.90 05/30/2016 -- seen by [name] [name] -- is known to have a prior laser ablation of his left great saphenous vein in 2009. Prior to that he had 2 ablations of the odd same vein by interventional radiology. As noted to have severe venous hypertension bilaterally. 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 His right great saphenous vein is somewhat dilated but no evidence of reflux. The only deep venous reflux demonstrated was in his left common femoral vein. After every  consideration [name] [name] recommended reattempt at ablation versus removal of his left great saphenous vein in the operating room with the standard vein stripping technique. The patient would consider this and let him know again. 06/20/16 patient continues to wear a juxtalite on the right leg without any open areas. On the lateral aspect of his left leg he has 4 wounds and a small area on the medial area of the left leg. Using silver alginate under Profore 06/27/16 still no open areas on the right leg. On the lateral aspect of his left leg he has 4 wounds which continue to have a nonviable surface and wheeze been using silver alginate under Profore 07/04/2016 -- patient hasn't yet to contact his vascular surgeon regarding plans for surgical intervention and I believe he is trying his best to avoid surgery. I have again discussed with him the futility of trying to heal this and keep it healed, if he does not agree to surgical intervention 07/11/2016 -- the patient has not had any juxta light ordered for at least 3 years and we will order him a pair. 08/08/2016 -- he has been approved for Apligraf and they will get this ready for him next week 08/15/2016 -- he has his first Apligraf applied today 08/29/2016 -- he has had his second application of Apligraf today 09/12/2016 -- his Apligraf has not arrived today due to the holiday 09/19/2016 -- he has had his third application of Apligraf today 10/03/2016 -- he has had his fourth application of Apligraf today. 10/18/2016 -- his next Apligraf has not arrived today. He has had chronic problems with his back and was to start on steroids and I have told him there are no objections against this. He is also taking appropriate medications as per his orthopedic doctor. 10/24/2016 -- he is here for his fifth application of Apligraf today. 01/09/2017 -- is been having repeated falls and problems with his back and saw a spine surgeon who has recommended holding his  anticoagulation and will have some epidural injections in a few weeks. 03/13/2017 - he had been doing very well with his left lower extremity and the ulcerations that come down significantly. However last week he may have hit himself against a metal cabinet and has started having abrasions and because of this has started weeping from the right lateral calf. He has not used his compression since morning and his right lower extremity has markedly increased and lymphedema 03/27/17; the patient appears to be doing very well only a small cluster of wounds on the right lateral lower extremity. Most of the areas on his left anterior and left posterior leg are closed the wrong way to closing. His compression slipped down today there is irritation where the wrap edge was but no evidence of infection 04/24/2017 -- the patient has been using his lymphedema pumps and is also wearing his new compression on the right lower  extremity. 05/01/2017 -- he has begun using his lymphedema pumps for a longer period of time but unfortunately had a fall and may have bruised his left lateral lower extremity under the 4-layer compression wrap and has multiple ulcerations in this area today 06/05/2017 -- after examination today he is noted to have taken a significant turn for the worse with multiple open ulcerations on his left lower calf and anterior leg. Lymphedema is better controlled and there is no evidence of cellulitis. I believe the patient is not being compliant with his lymphedema pumps. 06/12/2017 -- the patient did not come for his nurse visit change to the left lower extremity on Friday, as advised. He has not been doing his compression appropriately and now has developed a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 (Subcutaneous Tissue) exposed. There is no tunneling or undermining noted. There is a medium amount of serosanguineous drainage noted. The wound margin is flat and intact. There is large (67-100%) red, hyper - granulation within the wound bed. There is no necrotic tissue within the wound bed. Wound #68 status is Open. Original cause of wound was Gradually Appeared. The date acquired was: 03/23/2020. The wound has been in treatment 34 weeks. The wound is located on the Left,Proximal,Anterior Lower Leg. The wound measures 3.8cm length x 3.5cm width x 0.1cm depth; 10.446cm^2 area and 1.045cm^3 volume. There is Fat Layer (Subcutaneous Tissue) exposed. There is no tunneling or undermining noted. There is a medium amount of serosanguineous drainage noted. The wound margin is flat and intact. There is large (67-100%) red granulation within the wound bed. There is no necrotic tissue within the wound bed. Wound #73 status is Open. Original cause of wound was Gradually Appeared. The date acquired was: 08/03/2020. The wound has been in treatment 13 weeks. The wound is located on the Right,Lateral Lower Leg. The wound measures 1.8cm length x 0.6cm width x 0.1cm depth; 0.848cm^2 area and 0.085cm^3 volume. There is Fat Layer (Subcutaneous Tissue) exposed. There is no tunneling or undermining noted. There is a medium amount of serosanguineous drainage noted. The wound margin is flat and intact. There is large (67-100%) red  granulation within the wound bed. There is no necrotic tissue within the wound bed. Wound #74 status is Open. Original cause of wound was Gradually Appeared. The date acquired was: 09/28/2020. The wound has been in treatment 7 weeks. The wound is located on the Left,Lateral Lower Leg. The wound measures 4.7cm length x 3.8cm width x 0.1cm depth; 14.027cm^2 area and 1.403cm^3 volume. There is Fat Layer (Subcutaneous Tissue) exposed. There is no tunneling or undermining noted. There is a medium amount of serosanguineous drainage noted. The wound margin is flat and intact. There is medium (34-66%) pink granulation within the wound bed. There is a medium (34-66%) amount of necrotic tissue within the wound bed including Adherent Slough. Wound #75 status is Open. Original cause of wound was Gradually Appeared. The date acquired was: 09/28/2020. The wound has been in treatment 7 weeks. The wound is located on the Left,Posterior Lower Leg. The wound measures 0.6cm length x 0.4cm width x 0.1cm depth; 0.188cm^2 area and 0.019cm^3 volume. There is Fat Layer (Subcutaneous Tissue) exposed. There is no tunneling or undermining noted. There is a small amount of serosanguineous drainage noted. The wound margin is flat and intact. There is large (67-100%) red granulation within the wound bed. There is no necrotic tissue within the wound bed. Wound #76 status is Open. Original cause of wound was Gradually Appeared. The date acquired was: 09/28/2020. The wound has been in treatment 7 weeks. The wound is located on the Left,Dorsal Foot. The  wound measures 0.7cm length x 1cm width x 0.2cm depth; 0.55cm^2 area and 0.11cm^3 volume. There is Fat Layer (Subcutaneous Tissue) exposed. There is no tunneling or undermining noted. There is a medium amount of serosanguineous drainage noted. The wound margin is flat and intact. There is medium (34-66%) pink granulation within the wound bed. There is a medium (34-66%) amount of necrotic  tissue within the wound bed including Adherent Slough. Wound #77 status is Open. Original cause of wound was Gradually Appeared. The date acquired was: 11/16/2020. The wound is located on the Left,Proximal,Lateral Lower Leg. The wound measures 3.5cm length x 1.3cm width x 0.1cm depth; 3.574cm^2 area and 0.357cm^3 volume. There is Fat Layer (Subcutaneous Tissue) exposed. There is no tunneling or undermining noted. There is a medium amount of serosanguineous drainage noted. The wound margin is flat and intact. There is large (67-100%) red, friable granulation within the wound bed. There is no necrotic tissue within the wound bed. Wound #78 status is Open. Original cause of wound was Gradually Appeared. The date acquired was: 11/16/2020. The wound is located on the Right,Posterior Lower Leg. The wound measures 2.5cm length x 5.5cm width x 0.1cm depth; 10.799cm^2 area and 1.08cm^3 volume. There is Fat Layer (Subcutaneous Tissue) exposed. There is no tunneling or undermining noted. There is a medium amount of serosanguineous drainage noted. The wound margin is flat and intact. There is medium (34-66%) red granulation within the wound bed. There is a medium (34-66%) amount of necrotic tissue within the wound bed including Adherent Slough. Wound #79 status is Open. Original cause of wound was Gradually Appeared. The date acquired was: 11/16/2020. The wound is located on the Left,Medial Lower Leg. The wound measures 5.8cm length x 2.8cm width x 0.1cm depth; 12.755cm^2 area and 1.275cm^3 volume. There is Fat Layer (Subcutaneous Tissue) exposed. There is no tunneling or undermining noted. There is a medium amount of serosanguineous drainage noted. The wound margin is flat and intact. There is large (67-100%) red granulation within the wound bed. There is no necrotic tissue within the wound bed. 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 Post procedure Diagnosis Wound #: Same as Pre-Procedure Plan Follow-up Appointments: Return appointment in 1 month. Bathing/ Shower/ Hygiene: May shower with protection but do not get wound dressing(s) wet. Edema Control - Lymphedema / SCD / Other: Lymphedema Pumps. Use Lymphedema pumps on leg(s) 2-3 times a day for 45-60 minutes. If wearing any wraps or hose, do not remove them. Continue exercising as instructed. Elevate legs to the level of the heart or above for 30 minutes daily and/or when sitting, a frequency of: Avoid standing for long periods of time. Exercise regularly Home Health: New wound care orders this week; continue Home Health for wound care. May utilize formulary equivalent dressing for wound treatment orders unless otherwise specified. - home health to change 2x a week {if possible Tuesday's and Friday's} WOUND #65: - Lower Leg Wound Laterality: Left, Anterior, Distal Cleanser: Normal Saline (Home Health) (Generic) 2 x Per Week/30 Days Discharge Instructions: Cleanse the wound with Normal Saline prior to applying a clean dressing using gauze sponges, not tissue or cotton balls. Peri-Wound Care: Sween Lotion (Moisturizing lotion) (  Home Health) (Generic) 2 x Per Week/30 Days Discharge Instructions: Apply moisturizing lotion as directed Prim Dressing: KerraCel Ag Gelling Fiber Dressing, 4x5 in (silver alginate) (Home Health) (Generic) 2 x Per Week/30 Days ary Discharge Instructions: Apply silver alginate to wound bed as instructed Secondary Dressing: Woven Gauze Sponge, Non-Sterile 4x4 in (Home Health) (Generic) 2 x Per Week/30 Days Discharge Instructions: Apply  over primary dressing as directed. Secondary Dressing: ABD Pad, 5x9 (Home Health) 2 x Per Week/30 Days Discharge Instructions: Apply over primary dressing as directed. Com pression Wrap: FourPress (4 layer compression wrap) (Home Health) (Generic) 2 x Per Week/30 Days Discharge Instructions: Apply four layer compression as directed. WOUND #68: - Lower Leg Wound Laterality: Left, Anterior, Proximal Cleanser: Normal Saline (Home Health) (Generic) 2 x Per Week/30 Days Discharge Instructions: Cleanse the wound with Normal Saline prior to applying a clean dressing using gauze sponges, not tissue or cotton balls. Peri-Wound Care: Sween Lotion (Moisturizing lotion) (Home Health) (Generic) 2 x Per Week/30 Days Discharge Instructions: Apply moisturizing lotion as directed Prim Dressing: KerraCel Ag Gelling Fiber Dressing, 4x5 in (silver alginate) (Home Health) (Generic) 2 x Per Week/30 Days ary Discharge Instructions: Apply silver alginate to wound bed as instructed Secondary Dressing: Woven Gauze Sponge, Non-Sterile 4x4 in (Home Health) (Generic) 2 x Per Week/30 Days Discharge Instructions: Apply over primary dressing as directed. Secondary Dressing: ABD Pad, 5x9 (Home Health) 2 x Per Week/30 Days Discharge Instructions: Apply over primary dressing as directed. Com pression Wrap: FourPress (4 layer compression wrap) (Home Health) (Generic) 2 x Per Week/30 Days Discharge Instructions: Apply four layer compression as directed. WOUND #73: - Lower Leg Wound Laterality: Right, Lateral Cleanser: Normal Saline (Home Health) (Generic) 2 x Per Week/30 Days Discharge Instructions: Cleanse the wound with Normal Saline prior to applying a clean dressing using gauze sponges, not tissue or cotton balls. Peri-Wound Care: Sween Lotion (Moisturizing lotion) (Home Health) (Generic) 2 x Per Week/30 Days Discharge Instructions: Apply moisturizing lotion as directed Prim Dressing: KerraCel Ag Gelling Fiber Dressing, 4x5  in (silver alginate) (Home Health) (Generic) 2 x Per Week/30 Days ary Discharge Instructions: Apply silver alginate to wound bed as instructed Secondary Dressing: Woven Gauze Sponge, Non-Sterile 4x4 in (Home Health) (Generic) 2 x Per Week/30 Days Discharge Instructions: Apply over primary dressing as directed. Secondary Dressing: ABD Pad, 5x9 (Home Health) 2 x Per Week/30 Days Discharge Instructions: Apply over primary dressing as directed. Com pression Wrap: FourPress (4 layer compression wrap) (Home Health) (Generic) 2 x Per Week/30 Days Discharge Instructions: Apply four layer compression as directed. WOUND #74: - Lower Leg Wound Laterality: Left, Lateral Cleanser: Normal Saline (Home Health) (Generic) 2 x Per Week/30 Days Discharge Instructions: Cleanse the wound with Normal Saline prior to applying a clean dressing using gauze sponges, not tissue or cotton balls. Peri-Wound Care: Sween Lotion (Moisturizing lotion) (Home Health) (Generic) 2 x Per Week/30 Days Discharge Instructions: Apply moisturizing lotion as directed Prim Dressing: KerraCel Ag Gelling Fiber Dressing, 4x5 in (silver alginate) (Home Health) (Generic) 2 x Per Week/30 Days ary Discharge Instructions: Apply silver alginate to wound bed as instructed Secondary Dressing: Woven Gauze Sponge, Non-Sterile 4x4 in (Home Health) (Generic) 2 x Per Week/30 Days Discharge Instructions: Apply over primary dressing as directed. Secondary Dressing: ABD Pad, 5x9 (Home Health) 2 x Per Week/30 Days Discharge Instructions: Apply over primary dressing as directed. Com pression Wrap: FourPress (4 layer compression wrap) (Home Health) (Generic) 2 x Per Week/30 Days Discharge Instructions: Apply four layer compression as directed. WOUND #  75: - Lower Leg Wound Laterality: Left, Posterior Cleanser: Normal Saline (Home Health) (Generic) 2 x Per Week/30 Days Discharge Instructions: Cleanse the wound with Normal Saline prior to applying a clean  dressing using gauze sponges, not tissue or cotton balls. Peri-Wound Care: Sween Lotion (Moisturizing lotion) (Home Health) (Generic) 2 x Per Week/30 Days Discharge Instructions: Apply moisturizing lotion as directed Prim Dressing: KerraCel Ag Gelling Fiber Dressing, 4x5 in (silver alginate) (Home Health) (Generic) 2 x Per Week/30 Days ary Discharge Instructions: Apply silver alginate to wound bed as instructed Secondary Dressing: Woven Gauze Sponge, Non-Sterile 4x4 in (Home Health) (Generic) 2 x Per Week/30 Days Discharge Instructions: Apply over primary dressing as directed. Secondary Dressing: ABD Pad, 5x9 (Home Health) 2 x Per Week/30 Days Discharge Instructions: Apply over primary dressing as directed. Com pression Wrap: FourPress (4 layer compression wrap) (Home Health) (Generic) 2 x Per Week/30 Days Discharge Instructions: Apply four layer compression as directed. WOUND #76: - Foot Wound Laterality: Dorsal, Left Cleanser: Normal Saline (Home Health) (Generic) 2 x Per Week/30 Days Discharge Instructions: Cleanse the wound with Normal Saline prior to applying a clean dressing using gauze sponges, not tissue or cotton balls. Peri-Wound Care: Sween Lotion (Moisturizing lotion) (Home Health) (Generic) 2 x Per Week/30 Days Discharge Instructions: Apply moisturizing lotion as directed Prim Dressing: KerraCel Ag Gelling Fiber Dressing, 4x5 in (silver alginate) (Home Health) (Generic) 2 x Per Week/30 Days ary Discharge Instructions: Apply silver alginate to wound bed as instructed Secondary Dressing: Woven Gauze Sponge, Non-Sterile 4x4 in (Home Health) (Generic) 2 x Per Week/30 Days Discharge Instructions: Apply over primary dressing as directed. Secondary Dressing: ABD Pad, 5x9 (Home Health) 2 x Per Week/30 Days Discharge Instructions: Apply over primary dressing as directed. Com pression Wrap: FourPress (4 layer compression wrap) (Home Health) (Generic) 2 x Per Week/30 Days Discharge  Instructions: Apply four layer compression as directed. WOUND #77: - Lower Leg Wound Laterality: Left, Lateral, Proximal Cleanser: Normal Saline (Home Health) (Generic) 2 x Per Week/30 Days Discharge Instructions: Cleanse the wound with Normal Saline prior to applying a clean dressing using gauze sponges, not tissue or cotton balls. Peri-Wound Care: Sween Lotion (Moisturizing lotion) (Home Health) (Generic) 2 x Per Week/30 Days Discharge Instructions: Apply moisturizing lotion as directed Prim Dressing: KerraCel Ag Gelling Fiber Dressing, 4x5 in (silver alginate) (Home Health) (Generic) 2 x Per Week/30 Days ary Discharge Instructions: Apply silver alginate to wound bed as instructed Secondary Dressing: Woven Gauze Sponge, Non-Sterile 4x4 in (Home Health) (Generic) 2 x Per Week/30 Days Discharge Instructions: Apply over primary dressing as directed. Secondary Dressing: ABD Pad, 5x9 (Home Health) 2 x Per Week/30 Days Discharge Instructions: Apply over primary dressing as directed. Com pression Wrap: FourPress (4 layer compression wrap) (Home Health) (Generic) 2 x Per Week/30 Days Discharge Instructions: Apply four layer compression as directed. WOUND #78: - Lower Leg Wound Laterality: Right, Posterior Cleanser: Normal Saline (Home Health) (Generic) 2 x Per Week/30 Days Discharge Instructions: Cleanse the wound with Normal Saline prior to applying a clean dressing using gauze sponges, not tissue or cotton balls. Peri-Wound Care: Sween Lotion (Moisturizing lotion) (Home Health) (Generic) 2 x Per Week/30 Days Discharge Instructions: Apply moisturizing lotion as directed Prim Dressing: KerraCel Ag Gelling Fiber Dressing, 4x5 in (silver alginate) (Home Health) (Generic) 2 x Per Week/30 Days ary Discharge Instructions: Apply silver alginate to wound bed as instructed Secondary Dressing: Woven Gauze Sponge, Non-Sterile 4x4 in (Home Health) (Generic) 2 x Per Week/30 Days Discharge Instructions: Apply  over primary dressing  as directed. Secondary Dressing: ABD Pad, 5x9 (Home Health) 2 x Per Week/30 Days Discharge Instructions: Apply over primary dressing as directed. Com pression Wrap: FourPress (4 layer compression wrap) (Home Health) (Generic) 2 x Per Week/30 Days Discharge Instructions: Apply four layer compression as directed. WOUND #79: - Lower Leg Wound Laterality: Left, Medial Cleanser: Normal Saline (Home Health) (Generic) 2 x Per Week/30 Days Discharge Instructions: Cleanse the wound with Normal Saline prior to applying a clean dressing using gauze sponges, not tissue or cotton balls. Peri-Wound Care: Sween Lotion (Moisturizing lotion) (Home Health) (Generic) 2 x Per Week/30 Days Discharge Instructions: Apply moisturizing lotion as directed Prim Dressing: KerraCel Ag Gelling Fiber Dressing, 4x5 in (silver alginate) (Home Health) (Generic) 2 x Per Week/30 Days ary Discharge Instructions: Apply silver alginate to wound bed as instructed Secondary Dressing: Woven Gauze Sponge, Non-Sterile 4x4 in (Home Health) (Generic) 2 x Per Week/30 Days Discharge Instructions: Apply over primary dressing as directed. Secondary Dressing: ABD Pad, 5x9 (Home Health) 2 x Per Week/30 Days Discharge Instructions: Apply over primary dressing as directed. Com pression Wrap: FourPress (4 layer compression wrap) (Home Health) (Generic) 2 x Per Week/30 Days Discharge Instructions: Apply four layer compression as directed. 1. At this point I do not see that a change in dressings is really going to accomplish anything. We are continue with silver alginate 2. Still 4-layer compression 3. I have once again talked to the patient about using his compression pumps at least daily. I told him I am worried about circumferential skin loss on the left leg 4. We will see if home health will change his dressing more than once a week but in our experience they may not agree to this Electronic Signature(s) Signed: 11/16/2020  5:59:48 PM By: Linton Ham MD Entered By: Linton Ham on 11/16/2020 14:04:22 -------------------------------------------------------------------------------- SuperBill Details Patient Name: Date of Service: [name] [name], [name] RDIN W. 11/16/2020 Medical Record Number: [id number] Patient Account Number: [id number] Date of Birth/Sex: Treating RN: 1956-01-15 (65 y.o. [name], Lauren Primary Care Provider: Consuello [name] Other Clinician: Referring Provider: Treating Provider/Extender: [name] [name] in Treatment: 51 Diagnosis Coding ICD-10 Codes Code Description [phone] Chronic venous hypertension (idiopathic) with ulcer and inflammation of bilateral lower extremity I89.0 Lymphedema, not elsewhere classified L97.821 Non-pressure chronic ulcer of other part of left lower leg limited to breakdown of skin L97.811 Non-pressure chronic ulcer of other part of right lower leg limited to breakdown of skin Facility Procedures CPT4: Code [id number] Description: 81 BILATERAL: Application of multi-layer venous compression system; leg (below knee), including ankle and t. Modifier: Quantity: 1 Physician Procedures : CPT4 Code Description Modifier [id number] - WC PHYS LEVEL 3 - EST PT ICD-10 Diagnosis Description L97.821 Non-pressure chronic ulcer of other part of left lower leg limited to breakdown of skin L97.811 Non-pressure chronic ulcer of other part of  right lower leg limited to breakdown of skin I87.333 Chronic venous hypertension (idiopathic) with ulcer and inflammation of bilateral lower extremity I89.0 Lymphedema, not elsewhere classified Quantity: 1 Electronic Signature(s) Signed: 11/16/2020 5:59:48 PM By: Linton Ham MD Entered By: Linton Ham on 11/16/2020 14:04:46

## 2020-11-16 NOTE — Progress Notes (Addendum)
[name] [name] ([id number]) , Visit Report for 11/16/2020 Arrival Information Details Patient Name: Date of Service: [name] [name] RDIN W. 11/16/2020 1:00 PM Medical Record Number: [id number] Patient Account Number: [id number] Date of Birth/Sex: Treating RN: [birth year] (65 y.o. [name] [name] Primary Care Merrell Rettinger: [name] Other Clinician: Referring Johnross Nabozny: Treating Jahmiyah Dullea/Extender: [name] in Treatment: 33 Visit Information History Since Last Visit Added or deleted any medications: No Patient Arrived: [name] [name] Any new allergies or adverse reactions: No Arrival Time: 12:57 Had a fall or experienced change in No Accompanied By: self activities of daily living that may affect Transfer Assistance: None risk of falls: Patient Identification Verified: Yes Signs or symptoms of abuse/neglect since last visito No Secondary Verification Process Completed: Yes Hospitalized since last visit: No Patient Requires Transmission-Based Precautions: No Implantable device outside of the clinic excluding No Patient Has Alerts: Yes cellular tissue based products placed in the center Patient Alerts: R ABI = 1.25 since last visit: L ABI = 1.27 Has Dressing in Place as Prescribed: Yes Has Compression in Place as Prescribed: Yes Pain Present Now: Yes Electronic Signature(s) Signed: 11/16/2020 5:57:00 PM By: [name] RN, BSN Entered By: [name] on 11/16/2020 12:58:27 -------------------------------------------------------------------------------- Compression Therapy Details Patient Name: Date of Service: [name] [name], [name] 11/16/2020 1:00 PM Medical Record Number: [id number] Patient Account Number: [id number] Date of Birth/Sex: Treating RN: Aug 06, 1956 (65 y.o. [name] [name], [name] [name] Primary Care Jessica Checketts: [name] Other Clinician: Referring [name] [name]: Treating [name] [name]/Extender: [name] in Treatment: 51 Compression Therapy  Performed for Wound Assessment: NonWound Condition Lymphedema - Bilateral Leg Performed By: Clinician Rhae Hammock, RN Compression Type: Four Layer Post Procedure Diagnosis Same as Pre-procedure Electronic Signature(s) Signed: 11/16/2020 5:58:27 PM By: Rhae Hammock RN Entered By: Rhae Hammock on 11/16/2020 13:54:48 -------------------------------------------------------------------------------- Encounter Discharge Information Details Patient Name: Date of Service: [name] [name], [name] 11/16/2020 1:00 PM Medical Record Number: [id number] Patient Account Number: [id number] Date of Birth/Sex: Treating RN: [birth year] (65 y.o. [name] [name] Primary Care [name] [name]: [name] Other Clinician: Referring Argelio Granier: Treating [name] [name]/Extender: [name] in Treatment: 37 Encounter Discharge Information Items Discharge Condition: Stable Ambulatory Status: Cane Discharge Destination: Home Transportation: Private Auto Accompanied By: alone Schedule Follow-up Appointment: Yes Clinical Summary of Care: Patient Declined Electronic Signature(s) Signed: 11/17/2020 2:59:34 PM By: Levan Hurst RN, BSN Entered By: Levan Hurst on 11/16/2020 18:00:02 -------------------------------------------------------------------------------- Lower Extremity Assessment Details Patient Name: Date of Service: [name] [name], [name] 11/16/2020 1:00 PM Medical Record Number: [id number] Patient Account Number: [id number] Date of Birth/Sex: Treating RN: Mar 05, 1956 (65 y.o. [name] [name] Primary Care Kodey Xue: [name] Other Clinician: Referring [name] [name]: Treating [name] [name]/Extender: [name] in Treatment: 51 Edema Assessment Assessed: [name]: No] [name]: No] Edema: [Left: Yes] [Right: Yes] Calf Left: Right: Point of Measurement: 30 cm From Medial Instep 41 cm 46.5 cm Ankle Left: Right: Point of Measurement: 13 cm From Medial Instep 27  cm 28.2 cm Vascular Assessment Pulses: Dorsalis Pedis Palpable: [Left:Yes] [Right:Yes] Electronic Signature(s) Signed: 11/16/2020 5:57:00 PM By: [name] RN, BSN Entered By: [name] on 11/16/2020 13:21:01 -------------------------------------------------------------------------------- Multi Wound Chart Details Patient Name: Date of Service: [name] [name], [name] 11/16/2020 1:00 PM Medical Record Number: [id number] Patient Account Number: [id number] Date of Birth/Sex: Treating RN: 1956-05-09 (65 y.o. [name] Primary Care [name] [name]: [name] Other Clinician: Referring [name] [name]: Treating [name] [name]/Extender: [name] in Treatment: 51 Vital Signs Height(in): 70 Pulse(bpm): 26  Weight(lbs): 360 Blood Pressure(mmHg): 138/82 Body Mass Index(BMI): 52 Temperature(F): 98.3 Respiratory Rate(breaths/min): 22 Photos: [65:No Photos Left, Distal, Anterior Lower Leg] [68:No Photos Left, Proximal, Anterior Lower Leg] [73:No Photos Right, Lateral Lower Leg] Wound Location: [65:Gradually Appeared] [68:Gradually Appeared] [73:Gradually Appeared] Wounding Event: [65:Venous Leg Ulcer] [68:Lymphedema] [73:Lymphedema] Primary Etiology: [65:Lymphedema] [68:N/A] [73:N/A] Secondary Etiology: [65:Lymphedema, Sleep Apnea,] [68:Lymphedema, Sleep Apnea,] [73:Lymphedema, Sleep Apnea,] Comorbid History: [65:Congestive Heart Failure, Hypertension, Peripheral Venous Disease, Osteoarthritis 09/08/2019] [68:Congestive Heart Failure, Hypertension, Peripheral Venous Disease, Osteoarthritis 03/23/2020] [73:Congestive Heart Failure,  Hypertension, Peripheral Venous Disease, Osteoarthritis 08/03/2020] Date Acquired: [65:51] [68:34] [73:13] Weeks of Treatment: [65:Open] [68:Open] [73:Open] Wound Status: [65:Yes] [68:No] [73:No] Clustered Wound: [65:2] [68:N/A] [73:N/A] Clustered Quantity: [65:0.8x1.6x0.2] [68:3.8x3.5x0.1] [73:1.8x0.6x0.1] Measurements L x W x D (cm)  [65:1.005] [68:10.446] [73:0.848] A (cm) : rea [65:0.201] [68:1.045] [73:0.085] Volume (cm) : [65:99.80%] [68:-475.90%] [73:98.90%] % Reduction in Area: [65:99.70%] [68:-477.30%] [73:98.90%] % Reduction in Volume: [65:Full Thickness Without Exposed] [68:Full Thickness Without Exposed] [73:Full Thickness Without Exposed] Classification: [65:Support Structures Medium] [68:Support Structures Medium] [73:Support Structures Medium] Exudate Amount: [65:Serosanguineous] [68:Serosanguineous] [73:Serosanguineous] Exudate Type: [65:red, brown] [68:red, brown] [73:red, brown] Exudate Color: [65:Flat and Intact] [68:Flat and Intact] [73:Flat and Intact] Wound Margin: [65:Large (67-100%)] [68:Large (67-100%)] [73:Large (67-100%)] Granulation Amount: [65:Red, Hyper-granulation] [68:Red] [73:Red] Granulation Quality: [65:None Present (0%)] [68:None Present (0%)] [73:None Present (0%)] Necrotic Amount: [65:Fat Layer (Subcutaneous Tissue): Yes Fat Layer (Subcutaneous Tissue): Yes Fat Layer (Subcutaneous Tissue): Yes] Exposed Structures: [65:Fascia: No Tendon: No Muscle: No Joint: No Bone: No Medium (34-66%)] [68:Fascia: No Tendon: No Muscle: No Joint: No Bone: No Medium (34-66%)] [73:Fascia: No Tendon: No Muscle: No Joint: No Bone: No Small (1-33%)] Wound Number: [id number] Photos: No Photos No Photos No Photos Left, Lateral Lower Leg Left, Posterior Lower Leg Left, Dorsal Foot Wound Location: Gradually Appeared Gradually Appeared Gradually Appeared Wounding Event: Lymphedema Lymphedema Lymphedema Primary Etiology: N/A N/A N/A Secondary Etiology: Lymphedema, Sleep Apnea, Lymphedema, Sleep Apnea, Lymphedema, Sleep Apnea, Comorbid History: Congestive Heart Failure, Congestive Heart Failure, Congestive Heart Failure, Hypertension, Peripheral Venous Hypertension, Peripheral Venous Hypertension, Peripheral Venous Disease, Osteoarthritis Disease, Osteoarthritis Disease, Osteoarthritis 09/28/2020 09/28/2020  09/28/2020 Date Acquired: _0 Weeks of Treatment: Open Open Open Wound Status: No No No Clustered Wound: N/A N/A N/A Clustered Quantity: 4.7x3.8x0.1 0.6x0.4x0.1 0.7x1x0.2 Measurements L x W x D (cm) 14.027 0.188 0.55 A (cm) : rea 1.403 0.019 0.11 Volume (cm) : -2382.70% 62.00% -133.10% % Reduction in Area: -1141.60% 61.20% -358.30% % Reduction in Volume: Full Thickness Without Exposed Full Thickness Without Exposed Full Thickness Without Exposed Classification: Support Structures Support Structures Support Structures Medium Small Medium Exudate Amount: Serosanguineous Serosanguineous Serosanguineous Exudate Type: red, brown red, brown red, brown Exudate Color: Flat and Intact Flat and Intact Flat and Intact Wound Margin: Medium (34-66%) Large (67-100%) Medium (34-66%) Granulation Amount: Pink Red Pink Granulation Quality: Medium (34-66%) None Present (0%) Medium (34-66%) Necrotic Amount: Fat Layer (Subcutaneous Tissue): Yes Fat Layer (Subcutaneous Tissue): Yes Fat Layer (Subcutaneous Tissue): Yes Exposed Structures: Fascia: No Fascia: No Fascia: No Tendon: No Tendon: No Tendon: No Muscle: No Muscle: No Muscle: No Joint: No Joint: No Joint: No Bone: No Bone: No Bone: No Small (1-33%) Large (67-100%) Small (1-33%) Epithelialization: Wound Number: [id number] Photos: No Photos No Photos No Photos Left, Proximal, Lateral Lower Leg Right, Posterior Lower Leg Left, Medial Lower Leg Wound Location: Gradually Appeared Gradually Appeared Gradually Appeared Wounding Event: Lymphedema Lymphedema Lymphedema Primary Etiology: N/A N/A N/A Secondary Etiology: Lymphedema, Sleep Apnea, Lymphedema, Sleep Apnea,  Lymphedema, Sleep Apnea, Comorbid History: Congestive Heart Failure, Congestive Heart Failure, Congestive Heart Failure, Hypertension, Peripheral Venous Hypertension, Peripheral Venous Hypertension, Peripheral Venous Disease, Osteoarthritis Disease,  Osteoarthritis Disease, Osteoarthritis 11/16/2020 11/16/2020 11/16/2020 Date Acquired: 0 0 0 Weeks of Treatment: Open Open Open Wound Status: No No Yes Clustered Wound: N/A N/A 4 Clustered Quantity: 3.5x1.3x0.1 2.5x5.5x0.1 5.8x2.8x0.1 Measurements L x W x D (cm) 3.574 10.799 12.755 A (cm) : rea 0.357 1.08 1.275 Volume (cm) : N/A N/A N/A % Reduction in Area: N/A N/A N/A % Reduction in Volume: Full Thickness Without Exposed Full Thickness Without Exposed Full Thickness Without Exposed Classification: Support Structures Support Structures Support Structures Medium Medium Medium Exudate Amount: Serosanguineous Serosanguineous Serosanguineous Exudate Type: red, brown red, brown red, brown Exudate Color: Flat and Intact Flat and Intact Flat and Intact Wound Margin: Large (67-100%) Medium (34-66%) Large (67-100%) Granulation Amount: Red, Friable Red Red Granulation Quality: None Present (0%) Medium (34-66%) None Present (0%) Necrotic Amount: Fat Layer (Subcutaneous Tissue): Yes Fat Layer (Subcutaneous Tissue): Yes Fat Layer (Subcutaneous Tissue): Yes Exposed Structures: Fascia: No Fascia: No Fascia: No Tendon: No Tendon: No Tendon: No Muscle: No Muscle: No Muscle: No Joint: No Joint: No Joint: No Bone: No Bone: No Bone: No Small (1-33%) Small (1-33%) Medium (34-66%) Epithelialization: Treatment Notes Electronic Signature(s) Signed: 11/16/2020 5:58:27 PM By: Rhae Hammock RN Signed: 11/16/2020 5:59:48 PM By: Linton Ham MD Entered By: Linton Ham on 11/16/2020 13:57:31 -------------------------------------------------------------------------------- Multi-Disciplinary Care Plan Details Patient Name: Date of Service: [name] [name], [name] 11/16/2020 1:00 PM Medical Record Number: [id number] Patient Account Number: [id number] Date of Birth/Sex: Treating RN: 1955-09-29 (65 y.o. [name] [name], [name] [name] Primary Care Datron Brakebill: [name] Other  Clinician: Referring Kethan Papadopoulos: Treating Antionetta Ator/Extender: [name] in Treatment: 72 Active Inactive Wound/Skin Impairment Nursing Diagnoses: Knowledge deficit related to ulceration/compromised skin integrity Goals: Patient/caregiver will verbalize understanding of skin care regimen Date Initiated: 11/25/2019 Target Resolution Date: 12/17/2020 Goal Status: Active Ulcer/skin breakdown will have a volume reduction of 30% by week 4 Date Initiated: 11/25/2019 Date Inactivated: 01/06/2020 Target Resolution Date: 12/26/2019 Goal Status: Met Ulcer/skin breakdown will have a volume reduction of 50% by week 8 Date Initiated: 01/06/2020 Date Inactivated: 02/17/2020 Target Resolution Date: 02/05/2020 Goal Status: Met Interventions: Assess patient/caregiver ability to obtain necessary supplies Assess patient/caregiver ability to perform ulcer/skin care regimen upon admission and as needed Assess ulceration(s) every visit Notes: Electronic Signature(s) Signed: 11/16/2020 5:58:27 PM By: Rhae Hammock RN Entered By: Rhae Hammock on 11/16/2020 13:58:18 -------------------------------------------------------------------------------- Pain Assessment Details Patient Name: Date of Service: [name] [name], [name] 11/16/2020 1:00 PM Medical Record Number: [id number] Patient Account Number: [id number] Date of Birth/Sex: Treating RN: 11-10-1955 (65 y.o. [name] [name] Primary Care Jaquayla Hege: [name] Other Clinician: Referring Nyella Eckels: Treating Nyara Capell/Extender: [name] in Treatment: 66 Active Problems Location of Pain Severity and Description of Pain Patient Has Paino Yes Site Locations Pain Location: Pain in Ulcers With Dressing Change: Yes Duration of the Pain. Constant / Intermittento Intermittent Rate the pain. Current Pain Level: 0 Worst Pain Level: 3 Least Pain Level: 0 Character of Pain Describe the Pain: Tender, Other:  sore Pain Management and Medication Current Pain Management: Other: tolerable Is the Current Pain Management Adequate: Adequate How does your wound impact your activities of daily livingo Sleep: No Bathing: No Appetite: No Relationship With Others: No Bladder Continence: No Emotions: No Bowel Continence: No Work: No Toileting: No Drive: No Dressing: No Hobbies: No Electronic Signature(s) Signed: 11/16/2020 5:57:00  PM By: [name] RN, BSN Entered By: [name] on 11/16/2020 13:03:59 -------------------------------------------------------------------------------- Patient/Caregiver Education Details Patient Name: Date of Service: [name] 3/1/2022andnbsp1:00 PM Medical Record Number: [id number] Patient Account Number: [id number] Date of Birth/Gender: Treating RN: [birth year] (65 y.o. [name] Primary Care Physician: [name] Other Clinician: Referring Physician: Treating Physician/Extender: [name] in Treatment: 36 Education Assessment Education Provided To: Patient Education Topics Provided Basic Hygiene: Methods: Explain/Verbal Responses: State content correctly Electronic Signature(s) Signed: 11/16/2020 5:58:27 PM By: Rhae Hammock RN Entered By: Rhae Hammock on 11/16/2020 13:58:37 -------------------------------------------------------------------------------- Wound Assessment Details Patient Name: Date of Service: [name] [name], [name] 11/16/2020 1:00 PM Medical Record Number: [id number] Patient Account Number: [id number] Date of Birth/Sex: Treating RN: [birth year] (65 y.o. [name] [name] Primary Care Aydyn Testerman: [name] Other Clinician: Referring Merridy Pascoe: Treating Elverta Dimiceli/Extender: [name] in Treatment: 51 Wound Status Wound Number: [id number] Primary Venous Leg Ulcer Etiology: Wound Location: Left, Distal, Anterior Lower Leg Secondary Lymphedema Wounding  Event: Gradually Appeared Etiology: Date Acquired: 09/08/2019 Wound Open Weeks Of Treatment: 51 Status: Clustered Wound: Yes Comorbid Lymphedema, Sleep Apnea, Congestive Heart Failure, History: Hypertension, Peripheral Venous Disease, Osteoarthritis Photos Wound Measurements Length: (cm) 0.8 Width: (cm) 1.6 Depth: (cm) 0.2 Clustered Quantity: 2 Area: (cm) 1.005 Volume: (cm) 0.201 % Reduction in Area: 99.8% % Reduction in Volume: 99.7% Epithelialization: Medium (34-66%) Tunneling: No Undermining: No Wound Description Classification: Full Thickness Without Exposed Support Structures Wound Margin: Flat and Intact Exudate Amount: Medium Exudate Type: Serosanguineous Exudate Color: red, brown Foul Odor After Cleansing: No Slough/Fibrino No Wound Bed Granulation Amount: Large (67-100%) Exposed Structure Granulation Quality: Red, Hyper-granulation Fascia Exposed: No Necrotic Amount: None Present (0%) Fat Layer (Subcutaneous Tissue) Exposed: Yes Tendon Exposed: No Muscle Exposed: No Joint Exposed: No Bone Exposed: No Treatment Notes Wound #65 (Lower Leg) Wound Laterality: Left, Anterior, Distal Cleanser Normal Saline Discharge Instruction: Cleanse the wound with Normal Saline prior to applying a clean dressing using gauze sponges, not tissue or cotton balls. Peri-Wound Care Sween Lotion (Moisturizing lotion) Discharge Instruction: Apply moisturizing lotion as directed Topical Primary Dressing KerraCel Ag Gelling Fiber Dressing, 4x5 in (silver alginate) Discharge Instruction: Apply silver alginate to wound bed as instructed Secondary Dressing Woven Gauze Sponge, Non-Sterile 4x4 in Discharge Instruction: Apply over primary dressing as directed. ABD Pad, 5x9 Discharge Instruction: Apply over primary dressing as directed. Secured With Compression Wrap FourPress (4 layer compression wrap) Discharge Instruction: Apply four layer compression as directed. Compression  Stockings Add-Ons Electronic Signature(s) Signed: 11/16/2020 5:53:33 PM By: Sandre Kitty Signed: 11/16/2020 5:57:00 PM By: [name] RN, BSN Entered By: Sandre Kitty on 11/16/2020 17:50:22 -------------------------------------------------------------------------------- Wound Assessment Details Patient Name: Date of Service: [name] [name], HA [place] W. 11/16/2020 1:00 PM Medical Record Number: [id number] Patient Account Number: [id number] Date of Birth/Sex: Treating RN: 05-09-1956 (65 y.o. [name] [name] Primary Care Jocelynn Gioffre: [name] Other Clinician: Referring Eliyah Mcshea: Treating Sadie Hazelett/Extender: [name] in Treatment: 71 Wound Status Wound Number: 68 Primary Lymphedema Etiology: Wound Location: Left, Proximal, Anterior Lower Leg Wound Open Wounding Event: Gradually Appeared Status: Date Acquired: 03/23/2020 Comorbid Lymphedema, Sleep Apnea, Congestive Heart Failure, Weeks Of Treatment: 34 History: Hypertension, Peripheral Venous Disease, Osteoarthritis Clustered Wound: No Photos Wound Measurements Length: (cm) 3.8 Width: (cm) 3.5 Depth: (cm) 0.1 Area: (cm) 10.446 Volume: (cm) 1.045 % Reduction in Area: -475.9% % Reduction in Volume: -477.3% Epithelialization: Medium (34-66%) Tunneling: No Undermining: No Wound Description Classification: Full Thickness Without Exposed  Support Structures Wound Margin: Flat and Intact Exudate Amount: Medium Exudate Type: Serosanguineous Exudate Color: red, brown Foul Odor After Cleansing: No Slough/Fibrino No Wound Bed Granulation Amount: Large (67-100%) Exposed Structure Granulation Quality: Red Fascia Exposed: No Necrotic Amount: None Present (0%) Fat Layer (Subcutaneous Tissue) Exposed: Yes Tendon Exposed: No Muscle Exposed: No Joint Exposed: No Bone Exposed: No Treatment Notes Wound #68 (Lower Leg) Wound Laterality: Left, Anterior, Proximal Cleanser Normal Saline Discharge  Instruction: Cleanse the wound with Normal Saline prior to applying a clean dressing using gauze sponges, not tissue or cotton balls. Peri-Wound Care Sween Lotion (Moisturizing lotion) Discharge Instruction: Apply moisturizing lotion as directed Topical Primary Dressing KerraCel Ag Gelling Fiber Dressing, 4x5 in (silver alginate) Discharge Instruction: Apply silver alginate to wound bed as instructed Secondary Dressing Woven Gauze Sponge, Non-Sterile 4x4 in Discharge Instruction: Apply over primary dressing as directed. ABD Pad, 5x9 Discharge Instruction: Apply over primary dressing as directed. Secured With Compression Wrap FourPress (4 layer compression wrap) Discharge Instruction: Apply four layer compression as directed. Compression Stockings Add-Ons Electronic Signature(s) Signed: 11/16/2020 5:53:33 PM By: Sandre Kitty Signed: 11/16/2020 5:57:00 PM By: [name] RN, BSN Entered By: Sandre Kitty on 11/16/2020 17:49:56 -------------------------------------------------------------------------------- Wound Assessment Details Patient Name: Date of Service: [name] [name], HA [place] W. 11/16/2020 1:00 PM Medical Record Number: [id number] Patient Account Number: [id number] Date of Birth/Sex: Treating RN: Jan 18, 1956 (65 y.o. [name] [name] Primary Care Jeniel Slauson: [name] Other Clinician: Referring Troy Hartzog: Treating Agustine Rossitto/Extender: [name] in Treatment: 51 Wound Status Wound Number: [id number] Primary Lymphedema Etiology: Wound Location: Right, Lateral Lower Leg Wound Open Wounding Event: Gradually Appeared Status: Date Acquired: 08/03/2020 Comorbid Lymphedema, Sleep Apnea, Congestive Heart Failure, Weeks Of Treatment: 13 History: Hypertension, Peripheral Venous Disease, Osteoarthritis Clustered Wound: No Photos Wound Measurements Length: (cm) 1.8 Width: (cm) 0.6 Depth: (cm) 0.1 Area: (cm) 0.848 Volume: (cm) 0.085 Wound  Description Classification: Full Thickness Without Exposed Support Structures Wound Margin: Flat and Intact Exudate Amount: Medium Exudate Type: Serosanguineous Exudate Color: red, brown Foul Odor After Cleansing: Slough/Fibrino % Reduction in Area: 98.9% % Reduction in Volume: 98.9% Epithelialization: Small (1-33%) Tunneling: No Undermining: No No No Wound Bed Granulation Amount: Large (67-100%) Exposed Structure Granulation Quality: Red Fascia Exposed: No Necrotic Amount: None Present (0%) Fat Layer (Subcutaneous Tissue) Exposed: Yes Tendon Exposed: No Muscle Exposed: No Joint Exposed: No Bone Exposed: No Treatment Notes Wound #73 (Lower Leg) Wound Laterality: Right, Lateral Cleanser Normal Saline Discharge Instruction: Cleanse the wound with Normal Saline prior to applying a clean dressing using gauze sponges, not tissue or cotton balls. Peri-Wound Care Sween Lotion (Moisturizing lotion) Discharge Instruction: Apply moisturizing lotion as directed Topical Primary Dressing KerraCel Ag Gelling Fiber Dressing, 4x5 in (silver alginate) Discharge Instruction: Apply silver alginate to wound bed as instructed Secondary Dressing Woven Gauze Sponge, Non-Sterile 4x4 in Discharge Instruction: Apply over primary dressing as directed. ABD Pad, 5x9 Discharge Instruction: Apply over primary dressing as directed. Secured With Compression Wrap FourPress (4 layer compression wrap) Discharge Instruction: Apply four layer compression as directed. Compression Stockings Add-Ons Electronic Signature(s) Signed: 11/16/2020 5:53:33 PM By: Sandre Kitty Signed: 11/16/2020 5:57:00 PM By: [name] RN, BSN Entered By: Sandre Kitty on 11/16/2020 17:46:59 -------------------------------------------------------------------------------- Wound Assessment Details Patient Name: Date of Service: [name] [name], HA [place] W. 11/16/2020 1:00 PM Medical Record Number: [id number] Patient Account  Number: [id number] Date of Birth/Sex: Treating RN: Mar 14, 1956 (65 y.o. [name] [name] Primary Care Querida Beretta: [name] Other Clinician: Referring Dhairya Corales: Treating Linn Goetze/Extender: Linton Ham  Sasser, August Albino in Treatment: 51 Wound Status Wound Number: 74 Primary Lymphedema Etiology: Etiology: Wound Location: Left, Lateral Lower Leg Wound Open Wounding Event: Gradually Appeared Status: Date Acquired: 09/28/2020 Comorbid Lymphedema, Sleep Apnea, Congestive Heart Failure, Weeks Of Treatment: 7 History: Hypertension, Peripheral Venous Disease, Osteoarthritis Clustered Wound: No Photos Wound Measurements Length: (cm) 4.7 Width: (cm) 3.8 Depth: (cm) 0.1 Area: (cm) 14.027 Volume: (cm) 1.403 % Reduction in Area: -2382.7% % Reduction in Volume: -1141.6% Epithelialization: Small (1-33%) Tunneling: No Undermining: No Wound Description Classification: Full Thickness Without Exposed Support Structures Wound Margin: Flat and Intact Exudate Amount: Medium Exudate Type: Serosanguineous Exudate Color: red, brown Foul Odor After Cleansing: No Slough/Fibrino No Wound Bed Granulation Amount: Medium (34-66%) Exposed Structure Granulation Quality: Pink Fascia Exposed: No Necrotic Amount: Medium (34-66%) Fat Layer (Subcutaneous Tissue) Exposed: Yes Necrotic Quality: Adherent Slough Tendon Exposed: No Muscle Exposed: No Joint Exposed: No Bone Exposed: No Treatment Notes Wound #74 (Lower Leg) Wound Laterality: Left, Lateral Cleanser Normal Saline Discharge Instruction: Cleanse the wound with Normal Saline prior to applying a clean dressing using gauze sponges, not tissue or cotton balls. Peri-Wound Care Sween Lotion (Moisturizing lotion) Discharge Instruction: Apply moisturizing lotion as directed Topical Primary Dressing KerraCel Ag Gelling Fiber Dressing, 4x5 in (silver alginate) Discharge Instruction: Apply silver alginate to wound bed as  instructed Secondary Dressing Woven Gauze Sponge, Non-Sterile 4x4 in Discharge Instruction: Apply over primary dressing as directed. ABD Pad, 5x9 Discharge Instruction: Apply over primary dressing as directed. Secured With Compression Wrap FourPress (4 layer compression wrap) Discharge Instruction: Apply four layer compression as directed. Compression Stockings Add-Ons Electronic Signature(s) Signed: 11/16/2020 5:53:33 PM By: Sandre Kitty Signed: 11/16/2020 5:57:00 PM By: [name] RN, BSN Entered By: Sandre Kitty on 11/16/2020 17:48:59 -------------------------------------------------------------------------------- Wound Assessment Details Patient Name: Date of Service: [name] [name], HA [place] W. 11/16/2020 1:00 PM Medical Record Number: [id number] Patient Account Number: [id number] Date of Birth/Sex: Treating RN: 1956-02-04 (65 y.o. [name] [name] Primary Care Alaijah Gibler: [name] Other Clinician: Referring Rikita Grabert: Treating Kalvin Buss/Extender: [name] in Treatment: 51 Wound Status Wound Number: 75 Primary Lymphedema Etiology: Wound Location: Left, Posterior Lower Leg Wound Open Wounding Event: Gradually Appeared Status: Date Acquired: 09/28/2020 Comorbid Lymphedema, Sleep Apnea, Congestive Heart Failure, Weeks Of Treatment: 7 History: Hypertension, Peripheral Venous Disease, Osteoarthritis Clustered Wound: No Photos Wound Measurements Length: (cm) 0.6 Width: (cm) 0.4 Depth: (cm) 0.1 Area: (cm) 0.188 Volume: (cm) 0.019 % Reduction in Area: 62% % Reduction in Volume: 61.2% Epithelialization: Large (67-100%) Tunneling: No Undermining: No Wound Description Classification: Full Thickness Without Exposed Support Structures Wound Margin: Flat and Intact Exudate Amount: Small Exudate Type: Serosanguineous Exudate Color: red, brown Foul Odor After Cleansing: No Slough/Fibrino No Wound Bed Granulation Amount: Large (67-100%)  Exposed Structure Granulation Quality: Red Fascia Exposed: No Necrotic Amount: None Present (0%) Fat Layer (Subcutaneous Tissue) Exposed: Yes Tendon Exposed: No Muscle Exposed: No Joint Exposed: No Bone Exposed: No Treatment Notes Wound #75 (Lower Leg) Wound Laterality: Left, Posterior Cleanser Normal Saline Discharge Instruction: Cleanse the wound with Normal Saline prior to applying a clean dressing using gauze sponges, not tissue or cotton balls. Peri-Wound Care Sween Lotion (Moisturizing lotion) Discharge Instruction: Apply moisturizing lotion as directed Topical Primary Dressing KerraCel Ag Gelling Fiber Dressing, 4x5 in (silver alginate) Discharge Instruction: Apply silver alginate to wound bed as instructed Secondary Dressing Woven Gauze Sponge, Non-Sterile 4x4 in Discharge Instruction: Apply over primary dressing as directed. ABD Pad, 5x9 Discharge Instruction: Apply over primary dressing as directed.  Secured With Compression Wrap FourPress (4 layer compression wrap) Discharge Instruction: Apply four layer compression as directed. Compression Stockings Add-Ons Electronic Signature(s) Signed: 11/16/2020 5:53:33 PM By: Sandre Kitty Signed: 11/16/2020 5:57:00 PM By: [name] RN, BSN Entered By: Sandre Kitty on 11/16/2020 17:47:30 -------------------------------------------------------------------------------- Wound Assessment Details Patient Name: Date of Service: [name] [name], HA [place] W. 11/16/2020 1:00 PM Medical Record Number: [id number] Patient Account Number: [id number] Date of Birth/Sex: Treating RN: [birth year] (65 y.o. [name] [name] Primary Care Haivyn Oravec: [name] Other Clinician: Referring Robey Massmann: Treating Sherisa Gilvin/Extender: [name] in Treatment: 51 Wound Status Wound Number: 76 Primary Lymphedema Etiology: Wound Location: Left, Dorsal Foot Wound Open Wounding Event: Gradually Appeared Status: Date  Acquired: 09/28/2020 Comorbid Lymphedema, Sleep Apnea, Congestive Heart Failure, Weeks Of Treatment: 7 History: Hypertension, Peripheral Venous Disease, Osteoarthritis Clustered Wound: No Photos Wound Measurements Length: (cm) 0.7 Width: (cm) 1 Depth: (cm) 0.2 Area: (cm) 0.55 Volume: (cm) 0.11 % Reduction in Area: -133.1% % Reduction in Volume: -358.3% Epithelialization: Small (1-33%) Tunneling: No Undermining: No Wound Description Classification: Full Thickness Without Exposed Support Structures Wound Margin: Flat and Intact Exudate Amount: Medium Exudate Type: Serosanguineous Exudate Color: red, brown Foul Odor After Cleansing: No Slough/Fibrino Yes Wound Bed Granulation Amount: Medium (34-66%) Exposed Structure Granulation Quality: Pink Fascia Exposed: No Necrotic Amount: Medium (34-66%) Fat Layer (Subcutaneous Tissue) Exposed: Yes Necrotic Quality: Adherent Slough Tendon Exposed: No Muscle Exposed: No Joint Exposed: No Bone Exposed: No Treatment Notes Wound #76 (Foot) Wound Laterality: Dorsal, Left Cleanser Normal Saline Discharge Instruction: Cleanse the wound with Normal Saline prior to applying a clean dressing using gauze sponges, not tissue or cotton balls. Peri-Wound Care Sween Lotion (Moisturizing lotion) Discharge Instruction: Apply moisturizing lotion as directed Topical Primary Dressing KerraCel Ag Gelling Fiber Dressing, 4x5 in (silver alginate) Discharge Instruction: Apply silver alginate to wound bed as instructed Secondary Dressing Woven Gauze Sponge, Non-Sterile 4x4 in Discharge Instruction: Apply over primary dressing as directed. ABD Pad, 5x9 Discharge Instruction: Apply over primary dressing as directed. Secured With Compression Wrap FourPress (4 layer compression wrap) Discharge Instruction: Apply four layer compression as directed. Compression Stockings Add-Ons Electronic Signature(s) Signed: 11/16/2020 5:53:33 PM By: Sandre Kitty Signed: 11/16/2020 5:57:00 PM By: [name] RN, BSN Entered By: Sandre Kitty on 11/16/2020 17:49:25 -------------------------------------------------------------------------------- Wound Assessment Details Patient Name: Date of Service: [name] [name], HA [place] W. 11/16/2020 1:00 PM Medical Record Number: [id number] Patient Account Number: [id number] Date of Birth/Sex: Treating RN: 1956-05-17 (65 y.o. [name] [name] Primary Care Lorelle Macaluso: [name] Other Clinician: Referring Kathi Dohn: Treating Neri Samek/Extender: [name] in Treatment: 51 Wound Status Wound Number: 77 Primary Lymphedema Etiology: Wound Location: Left, Proximal, Lateral Lower Leg Wound Open Wounding Event: Gradually Appeared Status: Date Acquired: 11/16/2020 Comorbid Lymphedema, Sleep Apnea, Congestive Heart Failure, Weeks Of Treatment: 0 History: Hypertension, Peripheral Venous Disease, Osteoarthritis Clustered Wound: No Photos Wound Measurements Length: (cm) 3.5 Width: (cm) 1.3 Depth: (cm) 0.1 Area: (cm) 3.574 Volume: (cm) 0.357 % Reduction in Area: 0% % Reduction in Volume: 0% Epithelialization: Small (1-33%) Tunneling: No Undermining: No Wound Description Classification: Full Thickness Without Exposed Support Structures Wound Margin: Flat and Intact Exudate Amount: Medium Exudate Type: Serosanguineous Exudate Color: red, brown Foul Odor After Cleansing: No Slough/Fibrino No Wound Bed Granulation Amount: Large (67-100%) Exposed Structure Granulation Quality: Red, Friable Fascia Exposed: No Necrotic Amount: None Present (0%) Fat Layer (Subcutaneous Tissue) Exposed: Yes Tendon Exposed: No Muscle Exposed: No Joint Exposed: No Bone Exposed: No Treatment Notes Wound #77 (  Lower Leg) Wound Laterality: Left, Lateral, Proximal Cleanser Normal Saline Discharge Instruction: Cleanse the wound with Normal Saline prior to applying a clean dressing using gauze  sponges, not tissue or cotton balls. Peri-Wound Care Sween Lotion (Moisturizing lotion) Discharge Instruction: Apply moisturizing lotion as directed Topical Primary Dressing KerraCel Ag Gelling Fiber Dressing, 4x5 in (silver alginate) Discharge Instruction: Apply silver alginate to wound bed as instructed Secondary Dressing Woven Gauze Sponge, Non-Sterile 4x4 in Discharge Instruction: Apply over primary dressing as directed. ABD Pad, 5x9 Discharge Instruction: Apply over primary dressing as directed. Secured With Compression Wrap FourPress (4 layer compression wrap) Discharge Instruction: Apply four layer compression as directed. Compression Stockings Add-Ons Electronic Signature(s) Signed: 11/16/2020 5:53:33 PM By: Sandre Kitty Signed: 11/16/2020 5:57:00 PM By: [name] RN, BSN Entered By: Sandre Kitty on 11/16/2020 17:47:54 -------------------------------------------------------------------------------- Wound Assessment Details Patient Name: Date of Service: [name] [name], HA [place] W. 11/16/2020 1:00 PM Medical Record Number: [id number] Patient Account Number: [id number] Date of Birth/Sex: Treating RN: 06/11/1956 (66 y.o. [name] [name] Primary Care Mir Fullilove: [name] Other Clinician: Referring Virgene Tirone: Treating Lucresha Dismuke/Extender: [name] in Treatment: 51 Wound Status Wound Number: 78 Primary Lymphedema Etiology: Wound Location: Right, Posterior Lower Leg Wound Open Wounding Event: Gradually Appeared Status: Date Acquired: 11/16/2020 Comorbid Lymphedema, Sleep Apnea, Congestive Heart Failure, Weeks Of Treatment: 0 History: Hypertension, Peripheral Venous Disease, Osteoarthritis Clustered Wound: No Photos Wound Measurements Length: (cm) 2.5 Width: (cm) 5.5 Depth: (cm) 0.1 Area: (cm) 10.799 Volume: (cm) 1.08 % Reduction in Area: 0% % Reduction in Volume: 0% Epithelialization: Small (1-33%) Tunneling: No Undermining:  No Wound Description Classification: Full Thickness Without Exposed Support Structures Wound Margin: Flat and Intact Exudate Amount: Medium Exudate Type: Serosanguineous Exudate Color: red, brown Foul Odor After Cleansing: No Slough/Fibrino Yes Wound Bed Granulation Amount: Medium (34-66%) Exposed Structure Granulation Quality: Red Fascia Exposed: No Necrotic Amount: Medium (34-66%) Fat Layer (Subcutaneous Tissue) Exposed: Yes Necrotic Quality: Adherent Slough Tendon Exposed: No Muscle Exposed: No Joint Exposed: No Bone Exposed: No Treatment Notes Wound #78 (Lower Leg) Wound Laterality: Right, Posterior Cleanser Normal Saline Discharge Instruction: Cleanse the wound with Normal Saline prior to applying a clean dressing using gauze sponges, not tissue or cotton balls. Peri-Wound Care Sween Lotion (Moisturizing lotion) Discharge Instruction: Apply moisturizing lotion as directed Topical Primary Dressing KerraCel Ag Gelling Fiber Dressing, 4x5 in (silver alginate) Discharge Instruction: Apply silver alginate to wound bed as instructed Secondary Dressing Woven Gauze Sponge, Non-Sterile 4x4 in Discharge Instruction: Apply over primary dressing as directed. ABD Pad, 5x9 Discharge Instruction: Apply over primary dressing as directed. Secured With Compression Wrap FourPress (4 layer compression wrap) Discharge Instruction: Apply four layer compression as directed. Compression Stockings Add-Ons Electronic Signature(s) Signed: 11/16/2020 5:53:33 PM By: Sandre Kitty Signed: 11/16/2020 5:57:00 PM By: [name] RN, BSN Entered By: Sandre Kitty on 11/16/2020 17:46:34 -------------------------------------------------------------------------------- Wound Assessment Details Patient Name: Date of Service: [name] [name], HA [place] W. 11/16/2020 1:00 PM Medical Record Number: [id number] Patient Account Number: [id number] Date of Birth/Sex: Treating RN: Sep 11, 1956 (65 y.o. [name] [name] Primary Care Kearney Evitt: [name] Other Clinician: Referring Adaleena Mooers: Treating Colie Fugitt/Extender: [name] in Treatment: 51 Wound Status Wound Number: 79 Primary Lymphedema Etiology: Wound Location: Left, Medial Lower Leg Wound Open Wounding Event: Gradually Appeared Status: Date Acquired: 11/16/2020 Comorbid Lymphedema, Sleep Apnea, Congestive Heart Failure, Weeks Of Treatment: 0 History: Hypertension, Peripheral Venous Disease, Osteoarthritis Clustered Wound: Yes Photos Wound Measurements Length: (cm) 5.8 Width: (cm) 2.8 Depth: (cm) 0.1  Clustered Quantity: 4 Area: (cm) 12.755 Volume: (cm) 1.275 % Reduction in Area: 0% % Reduction in Volume: 0% Epithelialization: Medium (34-66%) Tunneling: No Undermining: No Wound Description Classification: Full Thickness Without Exposed Support Structures Wound Margin: Flat and Intact Exudate Amount: Medium Exudate Type: Serosanguineous Exudate Color: red, brown Foul Odor After Cleansing: No Slough/Fibrino No Wound Bed Granulation Amount: Large (67-100%) Exposed Structure Granulation Quality: Red Fascia Exposed: No Necrotic Amount: None Present (0%) Fat Layer (Subcutaneous Tissue) Exposed: Yes Tendon Exposed: No Muscle Exposed: No Joint Exposed: No Bone Exposed: No Treatment Notes Wound #79 (Lower Leg) Wound Laterality: Left, Medial Cleanser Normal Saline Discharge Instruction: Cleanse the wound with Normal Saline prior to applying a clean dressing using gauze sponges, not tissue or cotton balls. Peri-Wound Care Sween Lotion (Moisturizing lotion) Discharge Instruction: Apply moisturizing lotion as directed Topical Primary Dressing KerraCel Ag Gelling Fiber Dressing, 4x5 in (silver alginate) Discharge Instruction: Apply silver alginate to wound bed as instructed Secondary Dressing Woven Gauze Sponge, Non-Sterile 4x4 in Discharge Instruction: Apply over primary dressing as  directed. ABD Pad, 5x9 Discharge Instruction: Apply over primary dressing as directed. Secured With Compression Wrap FourPress (4 layer compression wrap) Discharge Instruction: Apply four layer compression as directed. Compression Stockings Add-Ons Electronic Signature(s) Signed: 11/16/2020 5:53:33 PM By: Sandre Kitty Signed: 11/16/2020 5:57:00 PM By: [name] RN, BSN Entered By: Sandre Kitty on 11/16/2020 17:50:55 -------------------------------------------------------------------------------- [place] Details Patient Name: Date of Service: [name] [name], HA [place] W. 11/16/2020 1:00 PM Medical Record Number: [id number] Patient Account Number: [id number] Date of Birth/Sex: Treating RN: 04/11/1956 (65 y.o. [name] [name] Primary Care Aleathia Purdy: [name] Other Clinician: Referring Creedence Heiss: Treating Sydnei Ohaver/Extender: [name] in Treatment: 51 Vital Signs Time Taken: 13:00 Temperature (F): 98.3 Height (in): 70 Pulse (bpm): 73 Source: Stated Respiratory Rate (breaths/min): 22 Weight (lbs): 360 Blood Pressure (mmHg): 138/82 Source: Stated Reference Range: 80 - 120 mg / dl Body Mass Index (BMI): 51.6 Airway Pulse Oximetry (%): 92 Electronic Signature(s) Signed: 11/16/2020 5:57:00 PM By: [name] RN, BSN Entered By: [name] on 11/16/2020 13:03:13

## 2020-11-18 DIAGNOSIS — E7800 Pure hypercholesterolemia, unspecified: Secondary | ICD-10-CM | POA: Diagnosis not present

## 2020-11-18 DIAGNOSIS — I10 Essential (primary) hypertension: Secondary | ICD-10-CM | POA: Diagnosis not present

## 2020-11-18 DIAGNOSIS — E7801 Familial hypercholesterolemia: Secondary | ICD-10-CM | POA: Diagnosis not present

## 2020-11-18 DIAGNOSIS — E1121 Type 2 diabetes mellitus with diabetic nephropathy: Secondary | ICD-10-CM | POA: Diagnosis not present

## 2020-11-19 DIAGNOSIS — I110 Hypertensive heart disease with heart failure: Secondary | ICD-10-CM | POA: Diagnosis not present

## 2020-11-19 DIAGNOSIS — I5032 Chronic diastolic (congestive) heart failure: Secondary | ICD-10-CM | POA: Diagnosis not present

## 2020-11-19 DIAGNOSIS — L97821 Non-pressure chronic ulcer of other part of left lower leg limited to breakdown of skin: Secondary | ICD-10-CM | POA: Diagnosis not present

## 2020-11-19 DIAGNOSIS — G4733 Obstructive sleep apnea (adult) (pediatric): Secondary | ICD-10-CM | POA: Diagnosis not present

## 2020-11-19 DIAGNOSIS — I87333 Chronic venous hypertension (idiopathic) with ulcer and inflammation of bilateral lower extremity: Secondary | ICD-10-CM | POA: Diagnosis not present

## 2020-11-19 DIAGNOSIS — E669 Obesity, unspecified: Secondary | ICD-10-CM | POA: Diagnosis not present

## 2020-11-22 DIAGNOSIS — E7801 Familial hypercholesterolemia: Secondary | ICD-10-CM | POA: Diagnosis not present

## 2020-11-22 DIAGNOSIS — Z8673 Personal history of transient ischemic attack (TIA), and cerebral infarction without residual deficits: Secondary | ICD-10-CM | POA: Diagnosis not present

## 2020-11-22 DIAGNOSIS — E1121 Type 2 diabetes mellitus with diabetic nephropathy: Secondary | ICD-10-CM | POA: Diagnosis not present

## 2020-11-22 DIAGNOSIS — I10 Essential (primary) hypertension: Secondary | ICD-10-CM | POA: Diagnosis not present

## 2020-11-22 DIAGNOSIS — G4733 Obstructive sleep apnea (adult) (pediatric): Secondary | ICD-10-CM | POA: Diagnosis not present

## 2020-11-22 DIAGNOSIS — Z6841 Body Mass Index (BMI) 40.0 and over, adult: Secondary | ICD-10-CM | POA: Diagnosis not present

## 2020-11-22 DIAGNOSIS — E1129 Type 2 diabetes mellitus with other diabetic kidney complication: Secondary | ICD-10-CM | POA: Diagnosis not present

## 2020-11-22 DIAGNOSIS — R296 Repeated falls: Secondary | ICD-10-CM | POA: Diagnosis not present

## 2020-11-25 DIAGNOSIS — E669 Obesity, unspecified: Secondary | ICD-10-CM | POA: Diagnosis not present

## 2020-11-25 DIAGNOSIS — I110 Hypertensive heart disease with heart failure: Secondary | ICD-10-CM | POA: Diagnosis not present

## 2020-11-25 DIAGNOSIS — G4733 Obstructive sleep apnea (adult) (pediatric): Secondary | ICD-10-CM | POA: Diagnosis not present

## 2020-11-25 DIAGNOSIS — L97821 Non-pressure chronic ulcer of other part of left lower leg limited to breakdown of skin: Secondary | ICD-10-CM | POA: Diagnosis not present

## 2020-11-25 DIAGNOSIS — I5032 Chronic diastolic (congestive) heart failure: Secondary | ICD-10-CM | POA: Diagnosis not present

## 2020-11-25 DIAGNOSIS — I87333 Chronic venous hypertension (idiopathic) with ulcer and inflammation of bilateral lower extremity: Secondary | ICD-10-CM | POA: Diagnosis not present

## 2020-11-27 DIAGNOSIS — R5381 Other malaise: Secondary | ICD-10-CM | POA: Diagnosis not present

## 2020-11-27 DIAGNOSIS — R531 Weakness: Secondary | ICD-10-CM | POA: Diagnosis not present

## 2020-12-03 DIAGNOSIS — I5032 Chronic diastolic (congestive) heart failure: Secondary | ICD-10-CM | POA: Diagnosis not present

## 2020-12-03 DIAGNOSIS — G4733 Obstructive sleep apnea (adult) (pediatric): Secondary | ICD-10-CM | POA: Diagnosis not present

## 2020-12-03 DIAGNOSIS — I87333 Chronic venous hypertension (idiopathic) with ulcer and inflammation of bilateral lower extremity: Secondary | ICD-10-CM | POA: Diagnosis not present

## 2020-12-03 DIAGNOSIS — E669 Obesity, unspecified: Secondary | ICD-10-CM | POA: Diagnosis not present

## 2020-12-03 DIAGNOSIS — I110 Hypertensive heart disease with heart failure: Secondary | ICD-10-CM | POA: Diagnosis not present

## 2020-12-03 DIAGNOSIS — L97821 Non-pressure chronic ulcer of other part of left lower leg limited to breakdown of skin: Secondary | ICD-10-CM | POA: Diagnosis not present

## 2020-12-07 DIAGNOSIS — E669 Obesity, unspecified: Secondary | ICD-10-CM | POA: Diagnosis not present

## 2020-12-07 DIAGNOSIS — L97821 Non-pressure chronic ulcer of other part of left lower leg limited to breakdown of skin: Secondary | ICD-10-CM | POA: Diagnosis not present

## 2020-12-07 DIAGNOSIS — I87333 Chronic venous hypertension (idiopathic) with ulcer and inflammation of bilateral lower extremity: Secondary | ICD-10-CM | POA: Diagnosis not present

## 2020-12-07 DIAGNOSIS — I5032 Chronic diastolic (congestive) heart failure: Secondary | ICD-10-CM | POA: Diagnosis not present

## 2020-12-07 DIAGNOSIS — G4733 Obstructive sleep apnea (adult) (pediatric): Secondary | ICD-10-CM | POA: Diagnosis not present

## 2020-12-07 DIAGNOSIS — I110 Hypertensive heart disease with heart failure: Secondary | ICD-10-CM | POA: Diagnosis not present

## 2020-12-10 DIAGNOSIS — G4733 Obstructive sleep apnea (adult) (pediatric): Secondary | ICD-10-CM | POA: Diagnosis not present

## 2020-12-10 DIAGNOSIS — I110 Hypertensive heart disease with heart failure: Secondary | ICD-10-CM | POA: Diagnosis not present

## 2020-12-10 DIAGNOSIS — I87333 Chronic venous hypertension (idiopathic) with ulcer and inflammation of bilateral lower extremity: Secondary | ICD-10-CM | POA: Diagnosis not present

## 2020-12-10 DIAGNOSIS — E669 Obesity, unspecified: Secondary | ICD-10-CM | POA: Diagnosis not present

## 2020-12-10 DIAGNOSIS — I5032 Chronic diastolic (congestive) heart failure: Secondary | ICD-10-CM | POA: Diagnosis not present

## 2020-12-10 DIAGNOSIS — L97821 Non-pressure chronic ulcer of other part of left lower leg limited to breakdown of skin: Secondary | ICD-10-CM | POA: Diagnosis not present

## 2020-12-13 DIAGNOSIS — I87333 Chronic venous hypertension (idiopathic) with ulcer and inflammation of bilateral lower extremity: Secondary | ICD-10-CM | POA: Diagnosis not present

## 2020-12-13 DIAGNOSIS — I110 Hypertensive heart disease with heart failure: Secondary | ICD-10-CM | POA: Diagnosis not present

## 2020-12-13 DIAGNOSIS — L97821 Non-pressure chronic ulcer of other part of left lower leg limited to breakdown of skin: Secondary | ICD-10-CM | POA: Diagnosis not present

## 2020-12-13 DIAGNOSIS — Z8673 Personal history of transient ischemic attack (TIA), and cerebral infarction without residual deficits: Secondary | ICD-10-CM | POA: Diagnosis not present

## 2020-12-13 DIAGNOSIS — E669 Obesity, unspecified: Secondary | ICD-10-CM | POA: Diagnosis not present

## 2020-12-13 DIAGNOSIS — Z9981 Dependence on supplemental oxygen: Secondary | ICD-10-CM | POA: Diagnosis not present

## 2020-12-13 DIAGNOSIS — I5032 Chronic diastolic (congestive) heart failure: Secondary | ICD-10-CM | POA: Diagnosis not present

## 2020-12-13 DIAGNOSIS — G4733 Obstructive sleep apnea (adult) (pediatric): Secondary | ICD-10-CM | POA: Diagnosis not present

## 2020-12-14 ENCOUNTER — Encounter (HOSPITAL_BASED_OUTPATIENT_CLINIC_OR_DEPARTMENT_OTHER): Payer: Medicare Other | Admitting: Internal Medicine

## 2020-12-14 ENCOUNTER — Other Ambulatory Visit: Payer: Self-pay

## 2020-12-14 DIAGNOSIS — L97811 Non-pressure chronic ulcer of other part of right lower leg limited to breakdown of skin: Secondary | ICD-10-CM | POA: Diagnosis not present

## 2020-12-14 DIAGNOSIS — L97821 Non-pressure chronic ulcer of other part of left lower leg limited to breakdown of skin: Secondary | ICD-10-CM | POA: Diagnosis not present

## 2020-12-14 DIAGNOSIS — L97229 Non-pressure chronic ulcer of left calf with unspecified severity: Secondary | ICD-10-CM | POA: Diagnosis not present

## 2020-12-14 DIAGNOSIS — L97822 Non-pressure chronic ulcer of other part of left lower leg with fat layer exposed: Secondary | ICD-10-CM | POA: Diagnosis not present

## 2020-12-14 DIAGNOSIS — I890 Lymphedema, not elsewhere classified: Secondary | ICD-10-CM | POA: Diagnosis not present

## 2020-12-14 DIAGNOSIS — I87333 Chronic venous hypertension (idiopathic) with ulcer and inflammation of bilateral lower extremity: Secondary | ICD-10-CM | POA: Diagnosis not present

## 2020-12-14 DIAGNOSIS — L97812 Non-pressure chronic ulcer of other part of right lower leg with fat layer exposed: Secondary | ICD-10-CM | POA: Diagnosis not present

## 2020-12-14 DIAGNOSIS — Z6841 Body Mass Index (BMI) 40.0 and over, adult: Secondary | ICD-10-CM | POA: Diagnosis not present

## 2020-12-15 DIAGNOSIS — E1121 Type 2 diabetes mellitus with diabetic nephropathy: Secondary | ICD-10-CM | POA: Diagnosis not present

## 2020-12-15 DIAGNOSIS — I110 Hypertensive heart disease with heart failure: Secondary | ICD-10-CM | POA: Diagnosis not present

## 2020-12-15 DIAGNOSIS — N200 Calculus of kidney: Secondary | ICD-10-CM | POA: Diagnosis not present

## 2020-12-15 DIAGNOSIS — I5033 Acute on chronic diastolic (congestive) heart failure: Secondary | ICD-10-CM | POA: Diagnosis not present

## 2020-12-16 DIAGNOSIS — E669 Obesity, unspecified: Secondary | ICD-10-CM | POA: Diagnosis not present

## 2020-12-16 DIAGNOSIS — I110 Hypertensive heart disease with heart failure: Secondary | ICD-10-CM | POA: Diagnosis not present

## 2020-12-16 DIAGNOSIS — L97821 Non-pressure chronic ulcer of other part of left lower leg limited to breakdown of skin: Secondary | ICD-10-CM | POA: Diagnosis not present

## 2020-12-16 DIAGNOSIS — G4733 Obstructive sleep apnea (adult) (pediatric): Secondary | ICD-10-CM | POA: Diagnosis not present

## 2020-12-16 DIAGNOSIS — I87333 Chronic venous hypertension (idiopathic) with ulcer and inflammation of bilateral lower extremity: Secondary | ICD-10-CM | POA: Diagnosis not present

## 2020-12-16 DIAGNOSIS — I5032 Chronic diastolic (congestive) heart failure: Secondary | ICD-10-CM | POA: Diagnosis not present

## 2020-12-16 NOTE — Progress Notes (Signed)
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 medial ankle. 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 Not surprisingly comes back in with wounds on his bilateral legs at the level of where his external  compression garment was tight superiorly. He tells me he is using his pumps once every second day. 7/20; 2-week follow-up the patient has not been using his compression pumps his wife was transferred to a new nursing home. He has 1 wound on the medial left lower leg and a small area on the right lateral 8/17; patient arrives today with only a smattering of small wounds on the left anterior lower leg most of these are eschared and look benign. There is nothing on his right leg. We have been using silver alginate under 4-layer compression 8/31; patient's wounds on the left anterior lower leg are larger. There is still nothing open on the right toe although we did put compression wraps on this last time. He has home health. Says he is used his compression pumps twice in the last 3 days. Obviously not sufficient 9/14; 2-week follow-up. We discharged him in his own zippered stocking. Apparently home health helped him change this and noted weeping edema fluid therefore put him back in a  wrap he arrives in the clinic today with no open wounds on the right innumerable small broken areas on the anterior left calf. Uncontrolled edema above the level of the wrap compatible with lymphedema 9/27 2-week follow-up. He arrives today with the left anterior lower extremity looking better. On the right he has a small open area posteriorly. I am going to put him back in compression on both sides. He claims compliance with his compression pumps at home twice a day he has home health changing his dressing 10/26; 1 month follow-up he has home health changing his dressings there is no open wound on the right leg he has extremitease stockings and external compression pumps which he says he is using once a day. The left leg is always been a more difficult site and although it is difficult to identify any precise open wound he has several leaking areas especially anteriorly and superiorly and at the edge of his wraps 11/9; 2-week follow-up. He has his extremities stocking on the right. He says he has been using his compression pumps once a day. He has 1 remaining wound on the left anterior mid tibia which looks healthy his edema control is otherwise good on the left 11/30; there is now a 3-week follow-up. We use his own compression stocking on the right last time as he has no open wounds. He apparently developed a UTI received a course of ciprofloxacin. He did not wear his compression pumps and I wonder whether he actually was using his stocking. Home health put a compression wrap on his right leg last week. He has multiple small open areas on the left anterior leg this week. 12/7; still a cluster of small areas on the left lateral calf and the right lateral calf. He has home health changing his dressings. For some reason he will not use compression pumps reliably this week because his wife spent 2 days in the hospital with anemia 12/21; he continues to have a cluster of small areas predominantly on the  left lateral but also the left anterior lower leg. More defined wounds on the right anterior and right medial. The he says he is using the compression pumps every other day. I have never understood the issue for this noncompliant. He still has home health coming out 1/11 the patient continues to have multiple small open areas on the left lateral left anterior and medial lower leg anteriorly. He also has a small punched-out painful area in  the crease of his left ankle which I think is probably a rapid injury. Very tender. I debrided in this area very adherent debris. Also small areas on the right lateral calf. We have been using silver alginate. His compression pump usage is probably marginal 2/8; the patient comes back in with his legs essentially in the same condition. He has multiple small areas on his bilateral lower extremities left greater than right anteriorly and posteriorly. A lot of these have small eschars on top some of them do not. His edema control is marginal. He says he uses his pumps once a day. He has home health changing his dressing 4-layer compression silver alginate as the primary dressing 3/1; Marked deterioration this visit. He has multiple small areas across the entirety of the lateral anterior and medial left lower leg. I am actually concerned that he may be on his way to a more substantive degree of breakdown in this area. He is using his compression pumps 4 times per week. He has home health changing this dressing week 3/29; again not much improvement from last time he is here significant bilateral small wounds for the most part skin breakdown. The larger areas are on the left lateral. Small areas bilaterally. He has poor edema control. Relates that he is not using his compression pumps with any reliability. He states his wife was in the hospital last week. He does not complain of chest pain or shortness of breath Electronic Signature(s) Signed: 12/16/2020 7:47:56 AM By:  Linton Ham MD Entered By: Linton Ham on 12/16/2020 07:42:20 -------------------------------------------------------------------------------- Physical Exam Details Patient Name: Date of Service: [name] [name], [name] [name] [place] W. 12/14/2020 1:00 PM Medical Record Number: [id number] Patient Account Number: [id number] Date of Birth/Sex: Treating RN: [birth year] (65 y.o. [name] [name] Primary Care Provider: [name] Other Clinician: Referring Provider: Treating Provider/Extender: [name] in Treatment: 29 Constitutional Patient is hypertensive.. Pulse regular and within target range for patient.Marland Kitchen Respirations regular, non-labored and within target range.. Temperature is normal and within the target range for the patient.Marland Kitchen Appears in no distress. Cardiovascular Pulses are palpable he does not have an arterial issue. Notes Wound exam; bilateral left greater than right leg wounds. His edema control is not good but certainly not as bad as have seen it in some occasions. Severe bilateral venous insufficiency with lymphedema. Electronic Signature(s) Signed: 12/16/2020 7:47:56 AM By: Linton Ham MD Entered By: Linton Ham on 12/16/2020 07:45:04 -------------------------------------------------------------------------------- Physician Orders Details Patient Name: Date of Service: [name] [name], [name] [name] [place] W. 12/14/2020 1:00 PM Medical Record Number: [id number] Patient Account Number: [id number] Date of Birth/Sex: Treating RN: Jan 11, 1956 (66 y.o. [name] [name] Primary Care Provider: [name] Other Clinician: Referring Provider: Treating Provider/Extender: [name] in Treatment: 46 Verbal / Phone Orders: No Diagnosis Coding Follow-up Appointments Return appointment in 1 month. Bathing/ Shower/ Hygiene May shower with protection but do not get wound dressing(s) wet. Edema Control - Lymphedema / SCD / Other Lymphedema Pumps. Use  Lymphedema pumps on leg(s) 2-3 times a day for 45-60 minutes. If wearing any wraps or hose, do not remove them. Continue exercising as instructed. Elevate legs to the level of the heart or above for 30 minutes daily and/or when sitting, a frequency of: Avoid standing for long periods of time. Exercise regularly [place] wound care orders this week; continue Home Health for wound care. May utilize formulary equivalent dressing for wound treatment orders unless otherwise specified. - home health to change 2x  a week {if possible Tuesday's and Friday's} Wound Treatment Wound #65 - Lower Leg Wound Laterality: Left, Anterior, Distal Cleanser: Normal Saline (Home Health) (Generic) 2 x Per Week/30 Days Discharge Instructions: Cleanse the wound with Normal Saline prior to applying a clean dressing using gauze sponges, not tissue or cotton balls. Peri-Wound Care: Sween Lotion (Moisturizing lotion) (Home Health) (Generic) 2 x Per Week/30 Days Discharge Instructions: Apply moisturizing lotion as directed Prim Dressing: KerraCel Ag Gelling Fiber Dressing, 4x5 in (silver alginate) (Home Health) (Generic) 2 x Per Week/30 Days ary Discharge Instructions: Apply silver alginate to wound bed as instructed Secondary Dressing: Woven Gauze Sponge, Non-Sterile 4x4 in (Home Health) (Generic) 2 x Per Week/30 Days Discharge Instructions: Apply over primary dressing as directed. Secondary Dressing: ABD Pad, 5x9 (Home Health) 2 x Per Week/30 Days Discharge Instructions: Apply over primary dressing as directed. Compression Wrap: FourPress (4 layer compression wrap) (Home Health) (Generic) 2 x Per Week/30 Days Discharge Instructions: Apply four layer compression as directed. Wound #68 - Lower Leg Wound Laterality: Left, Anterior, Proximal Cleanser: Normal Saline (Home Health) (Generic) 2 x Per Week/30 Days Discharge Instructions: Cleanse the wound with Normal Saline prior to applying a clean dressing using gauze  sponges, not tissue or cotton balls. Peri-Wound Care: Sween Lotion (Moisturizing lotion) (Home Health) (Generic) 2 x Per Week/30 Days Discharge Instructions: Apply moisturizing lotion as directed Prim Dressing: KerraCel Ag Gelling Fiber Dressing, 4x5 in (silver alginate) (Home Health) (Generic) 2 x Per Week/30 Days ary Discharge Instructions: Apply silver alginate to wound bed as instructed Secondary Dressing: Woven Gauze Sponge, Non-Sterile 4x4 in (Home Health) (Generic) 2 x Per Week/30 Days Discharge Instructions: Apply over primary dressing as directed. Secondary Dressing: ABD Pad, 5x9 (Home Health) 2 x Per Week/30 Days Discharge Instructions: Apply over primary dressing as directed. Compression Wrap: FourPress (4 layer compression wrap) (Home Health) (Generic) 2 x Per Week/30 Days Discharge Instructions: Apply four layer compression as directed. Wound #73 - Lower Leg Wound Laterality: Right, Lateral Cleanser: Normal Saline (Home Health) (Generic) 2 x Per Week/30 Days Discharge Instructions: Cleanse the wound with Normal Saline prior to applying a clean dressing using gauze sponges, not tissue or cotton balls. Peri-Wound Care: Sween Lotion (Moisturizing lotion) (Home Health) (Generic) 2 x Per Week/30 Days Discharge Instructions: Apply moisturizing lotion as directed Prim Dressing: KerraCel Ag Gelling Fiber Dressing, 4x5 in (silver alginate) (Home Health) (Generic) 2 x Per Week/30 Days ary Discharge Instructions: Apply silver alginate to wound bed as instructed Secondary Dressing: Woven Gauze Sponge, Non-Sterile 4x4 in (Home Health) (Generic) 2 x Per Week/30 Days Discharge Instructions: Apply over primary dressing as directed. Secondary Dressing: ABD Pad, 5x9 (Home Health) 2 x Per Week/30 Days Discharge Instructions: Apply over primary dressing as directed. Compression Wrap: FourPress (4 layer compression wrap) (Home Health) (Generic) 2 x Per Week/30 Days Discharge Instructions: Apply four  layer compression as directed. Wound #74 - Lower Leg Wound Laterality: Left, Lateral Cleanser: Normal Saline (Home Health) (Generic) 2 x Per Week/30 Days Discharge Instructions: Cleanse the wound with Normal Saline prior to applying a clean dressing using gauze sponges, not tissue or cotton balls. Peri-Wound Care: Sween Lotion (Moisturizing lotion) (Home Health) (Generic) 2 x Per Week/30 Days Discharge Instructions: Apply moisturizing lotion as directed Prim Dressing: KerraCel Ag Gelling Fiber Dressing, 4x5 in (silver alginate) (Home Health) (Generic) 2 x Per Week/30 Days ary Discharge Instructions: Apply silver alginate to wound bed as instructed Secondary Dressing: Woven Gauze Sponge, Non-Sterile 4x4 in (Home Health) (Generic) 2 x Per  Week/30 Days Discharge Instructions: Apply over primary dressing as directed. Secondary Dressing: ABD Pad, 5x9 (Home Health) 2 x Per Week/30 Days Discharge Instructions: Apply over primary dressing as directed. Compression Wrap: FourPress (4 layer compression wrap) (Home Health) (Generic) 2 x Per Week/30 Days Discharge Instructions: Apply four layer compression as directed. Wound #77 - Lower Leg Wound Laterality: Left, Lateral, Proximal Cleanser: Normal Saline (Home Health) (Generic) 2 x Per Week/30 Days Discharge Instructions: Cleanse the wound with Normal Saline prior to applying a clean dressing using gauze sponges, not tissue or cotton balls. Peri-Wound Care: Sween Lotion (Moisturizing lotion) (Home Health) (Generic) 2 x Per Week/30 Days Discharge Instructions: Apply moisturizing lotion as directed Prim Dressing: KerraCel Ag Gelling Fiber Dressing, 4x5 in (silver alginate) (Home Health) (Generic) 2 x Per Week/30 Days ary Discharge Instructions: Apply silver alginate to wound bed as instructed Secondary Dressing: Woven Gauze Sponge, Non-Sterile 4x4 in (Home Health) (Generic) 2 x Per Week/30 Days Discharge Instructions: Apply over primary dressing as  directed. Secondary Dressing: ABD Pad, 5x9 (Home Health) 2 x Per Week/30 Days Discharge Instructions: Apply over primary dressing as directed. Compression Wrap: FourPress (4 layer compression wrap) (Home Health) (Generic) 2 x Per Week/30 Days Discharge Instructions: Apply four layer compression as directed. Wound #78 - Lower Leg Wound Laterality: Right, Posterior Cleanser: Normal Saline (Home Health) (Generic) 2 x Per Week/30 Days Discharge Instructions: Cleanse the wound with Normal Saline prior to applying a clean dressing using gauze sponges, not tissue or cotton balls. Peri-Wound Care: Sween Lotion (Moisturizing lotion) (Home Health) (Generic) 2 x Per Week/30 Days Discharge Instructions: Apply moisturizing lotion as directed Prim Dressing: KerraCel Ag Gelling Fiber Dressing, 4x5 in (silver alginate) (Home Health) (Generic) 2 x Per Week/30 Days ary Discharge Instructions: Apply silver alginate to wound bed as instructed Secondary Dressing: Woven Gauze Sponge, Non-Sterile 4x4 in (Home Health) (Generic) 2 x Per Week/30 Days Discharge Instructions: Apply over primary dressing as directed. Secondary Dressing: ABD Pad, 5x9 (Home Health) 2 x Per Week/30 Days Discharge Instructions: Apply over primary dressing as directed. Compression Wrap: FourPress (4 layer compression wrap) (Home Health) (Generic) 2 x Per Week/30 Days Discharge Instructions: Apply four layer compression as directed. Electronic Signature(s) Signed: 12/14/2020 5:10:40 PM By: Rhae Hammock RN Signed: 12/16/2020 7:47:56 AM By: Linton Ham MD Entered By: Rhae Hammock on 12/14/2020 14:08:03 -------------------------------------------------------------------------------- Problem List Details Patient Name: Date of Service: [name] [name], [name] [name] [place] W. 12/14/2020 1:00 PM Medical Record Number: [id number] Patient Account Number: [id number] Date of Birth/Sex: Treating RN: August 27, 1956 (65 y.o. 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 12/14/2020 1:00 PM Medical Record Number: [id number] Patient Account Number: [id number] Date of Birth/Sex: Treating RN: 28-Jan-1956 (65 y.o. [name], Lauren  Primary Care Provider: [name] Other Clinician: Referring Provider: Treating Provider/Extender: [name] in Treatment: 71 Subjective History of Present Illness (HPI) The following HPI elements were documented for the patient's wound: Location: both legs Quality: Patient reports No Pain. long history of chronic venous hypertension,chf,morbid obesity. s/p gsv ablation. cva 2oo4. sleep apnea andhbp. breakdown of skin both legs around 2 months ago. treated here for this in 2015. no .dm. 05/09/2016 -- he had his arterial studies done last week and his right ABI was 1.3 and his left ABI was 1.4. His right toe brachial index was 0.98 and on the left was  0.9. Venous studies have only be done today and reports are awaited. 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 Donnetta Hutching. He had a arterial study done which showed a right ABI of 1.3 which is within normal limits at rest and a left ABI of 1.4 which is within normal limits at rest and may be falsely elevated. The right toe brachial index was 0.98 and the left toe brachial index was 0.90 05/30/2016 -- seen by Dr. Althea Charon -- is known to have a prior laser ablation of his left great saphenous vein in 2009. Prior to that he had 2 ablations of the odd same vein by interventional radiology. As noted to have severe venous hypertension bilaterally. 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 His right great saphenous vein is somewhat dilated but no evidence of reflux. The only deep venous reflux demonstrated was in his left common femoral vein. After every consideration Dr. Donnetta Hutching recommended reattempt at ablation versus removal of his left great saphenous vein in the operating room with the standard vein stripping technique. The patient would consider this and let him know again. 06/20/16 patient continues to wear a juxtalite on the right leg without any open areas. On the lateral aspect of his left leg he has 4 wounds and a small area on the medial area of the left leg. Using silver alginate under Profore 06/27/16 still no open areas on the right leg. On the lateral aspect of his left leg he has 4 wounds which continue to have a nonviable surface and wheeze been using silver alginate under Profore 07/04/2016 -- patient hasn't yet to contact his vascular surgeon regarding plans for surgical intervention and I believe he is trying his best to avoid surgery. I have again  discussed with him the futility of trying to heal this and keep it healed, if he does not agree to surgical intervention 07/11/2016 -- the patient has not had any juxta light ordered for at least 3 years and we will order him a pair. 08/08/2016 -- he has been approved for Apligraf and they will get this ready for him next week 08/15/2016 -- he has his first Apligraf applied today 08/29/2016 -- he has had his second application of Apligraf today 09/12/2016 -- his Apligraf has not arrived today due to the holiday 09/19/2016 -- he has had his third application of Apligraf today 10/03/2016 -- he has had his fourth application of Apligraf today. 10/18/2016 -- his next Apligraf has not arrived today. He has had chronic problems with his back and was to start on steroids and I have told him there are no objections against this. He is also taking  appropriate medications as per his orthopedic doctor. 10/24/2016 -- he is here for his fifth application of Apligraf today. 01/09/2017 -- is been having repeated falls and problems with his back and saw a spine surgeon who has recommended holding his anticoagulation and will have some epidural injections in a few weeks. 03/13/2017 - he had been doing very well with his left lower extremity and the ulcerations that come down significantly. However last week he may have hit himself against a metal cabinet and has started having abrasions and because of this has started weeping from the right lateral calf. He has not used his compression since morning and his right lower extremity has markedly increased and lymphedema 03/27/17; the patient appears to be doing very well only a small cluster of wounds on the right lateral lower extremity. Most of the areas on his left anterior and left posterior leg are closed the wrong way to closing. His compression slipped down today there is irritation where the wrap edge was but no evidence of infection 04/24/2017 -- the patient has  been using his lymphedema pumps and is also wearing his new compression on the right lower extremity. 05/01/2017 -- he has begun using his lymphedema pumps for a longer period of time but unfortunately had a fall and may have bruised his left lateral lower extremity under the 4-layer compression wrap and has multiple ulcerations in this area today 06/05/2017 -- after examination today he is noted to have taken a significant turn for the worse with multiple open ulcerations on his left lower calf and anterior leg. Lymphedema is better controlled and there is no evidence of cellulitis. I believe the patient is not being compliant with his lymphedema pumps. 06/12/2017 -- the patient did not come for his nurse visit change to the left lower extremity on Friday, as advised. He has not been doing his compression appropriately and now has developed a ulcerated area on the right lower extremity. He also has not been using his lymphedema pumps appropriately. in addition to this the patient tells me that he and his wife are going to the beach this coming Sunday for over a week 06/26/2017 -- the patient is back after 2 weeks when he had gone on vacation and his treatment was substandard and he did not do his lymphedema pump. He does not have any systemic symptoms 08/07/2017 -- he kept his compression stockings all week and says he has been using his compression wraps on the right lower leg. He is also saying he is diligent with his lymphedema pumps. 08/21/17; using his lymphedema pumps about twice a week. He keeps his compression wraps on the left lower leg. He has his compression stocking on the right leg 12/14/18patient continues to be noncompliant with the lymphedema pumps. He has extremitease stockings on the right leg. He has a cluster of wounds on the left leg we have been using silver alginate 09/12/2017 -- over the Christmas holidays his right leg has become extremely large with lymphedema and weeping  with ulceration and this is a huge step backward. I understand he has not been compliant with his diet or his lymphedema pumps 09/19/17 on evaluation today patient appears to be doing somewhat poorly due to the significant amount of fluid buildup in the right lower extremity especially. This has been somewhat macerated due to the fact that he is having so much drainage. No fevers, chills, nausea, or vomiting noted at this time. Patient has been tolerating the dressing changes but  notes that it doesn't take very long for the weeping to build up. He has not been using his compression pumps for lymphedema unfortunately as I do feel like this will be beneficial for him. No fevers, chills, nausea, or vomiting noted at this time. Patient has no evidence of dementia that is definitely noncompliant. 09/25/17; patient arrives with a lot of swelling in the right leg. Necrotic surface to the wound on the right lateral leg extending posteriorly. A lot of drainage and the right foot with maceration of the skin on the posterior right foot. There is smattering of wounds on the left lateral leg anteriorly and laterally. The edema control here is much better. He is definitely noncompliant and tells me he uses a compression pumps at most twice a week 10/02/17; patient's major wound is on the right lateral leg extending posteriorly although this does not look worse than last week. Surface looks better. He has a small collection of wounds on the left lateral leg and anterior left lateral leg. Edema control is better he does not use his compression pumps. He looked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 status is Healed - Epithelialized. Original cause of wound was Gradually Appeared. The date acquired was: 09/28/2020. The wound has been in treatment 11 weeks. The wound is located on the Left,Posterior Lower Leg. The wound measures 0cm length x 0cm width x 0cm depth; 0cm^2 area and 0cm^3 volume. There is no tunneling or undermining noted. There is a none present amount of drainage noted. The wound margin is flat and intact. There is no granulation within the wound bed. There is no necrotic tissue within the wound bed. Wound #76 status is Healed - Epithelialized. Original cause of wound was Gradually Appeared. The date acquired was: 09/28/2020. The wound has been in treatment 11 weeks. The wound is located on the Left,Dorsal Foot. The wound measures 0cm length x 0cm width x 0cm depth; 0cm^2 area and 0cm^3 volume. There is Fat Layer (Subcutaneous Tissue) exposed. There is no tunneling or undermining noted. There is a small amount of serosanguineous drainage noted. The wound margin is flat and intact. There is large (67-100%) pink, pale granulation within the wound bed. There is no necrotic tissue within the wound bed. Wound #77 status is Open. Original cause of wound was Gradually Appeared. The date acquired was: 11/16/2020. The wound has been in treatment 4 weeks. The wound is located on the Left,Proximal,Lateral Lower Leg. The wound measures 4.5cm length x 4cm width x 0.1cm depth; 14.137cm^2 area and 1.414cm^3 volume. There is Fat Layer (Subcutaneous Tissue) exposed. There is no tunneling or undermining noted. There is a medium amount of  serosanguineous drainage noted. The wound margin is flat and intact. There is large (67-100%) red, friable granulation within the wound bed. There is no necrotic tissue within the wound bed. Wound #78 status is Open. Original cause of wound was Gradually Appeared. The date acquired was: 11/16/2020. The wound has been in treatment 4 weeks. The wound is located on the Right,Posterior Lower Leg. The wound measures 1cm length x 0.5cm width x 0.1cm depth; 0.393cm^2 area and 0.039cm^3 volume. There is Fat Layer (Subcutaneous Tissue) exposed. There is no tunneling or undermining noted. There is a medium amount of serosanguineous drainage noted. The wound margin is flat and intact. There is large (67-100%) red granulation within the wound bed. There is a small (1-33%) amount of necrotic tissue within the wound bed including Adherent Slough. Wound #79 status is Healed - Epithelialized. Original cause of wound was Gradually Appeared. The date acquired was: 11/16/2020. The wound has been in treatment 4 weeks. The wound is located on the Left,Medial Lower Leg. The wound measures 0cm length x 0cm width x 0cm depth; 0cm^2 area and 0cm^3 volume. There is no tunneling noted. There is a none present amount of drainage  noted. The wound margin is flat and intact. There is no granulation within the wound bed. There is no necrotic tissue within the wound bed. 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 Post procedure Diagnosis Wound #: Same as Pre-Procedure Plan Follow-up Appointments: Return appointment in 1 month. Bathing/  Shower/ Hygiene: May shower with protection but do not get wound dressing(s) wet. Edema Control - Lymphedema / SCD / Other: Lymphedema Pumps. Use Lymphedema pumps on leg(s) 2-3 times a day for 45-60 minutes. If wearing any wraps or hose, do not remove them. Continue exercising as instructed. Elevate legs to the level of the heart or above for 30 minutes daily and/or when sitting, a frequency of: Avoid standing for long periods of time. Exercise regularly Home Health: New wound care orders this week; continue Home Health for wound care. May utilize formulary equivalent dressing for wound treatment orders unless otherwise specified. - home health to change 2x a week {if possible Tuesday's and Friday's} WOUND #65: - Lower Leg Wound Laterality: Left, Anterior, Distal Cleanser: Normal Saline (Home Health) (Generic) 2 x Per Week/30 Days Discharge Instructions: Cleanse the wound with Normal Saline prior to applying a clean dressing using gauze sponges, not tissue or cotton balls. Peri-Wound Care: Sween Lotion (Moisturizing lotion) (Home Health) (Generic) 2 x Per Week/30 Days Discharge Instructions: Apply moisturizing lotion as directed Prim Dressing: KerraCel Ag Gelling Fiber Dressing, 4x5 in (silver alginate) (Home Health) (Generic) 2 x Per Week/30 Days ary Discharge Instructions: Apply silver alginate to wound bed as instructed Secondary Dressing: Woven Gauze Sponge, Non-Sterile 4x4 in (Home Health) (Generic) 2 x Per Week/30 Days Discharge Instructions: Apply over primary dressing as directed. Secondary Dressing: ABD Pad, 5x9 (Home Health) 2 x Per Week/30 Days Discharge Instructions: Apply over primary dressing as directed. Com pression Wrap: FourPress (4 layer compression wrap) (Home Health) (Generic) 2 x Per Week/30 Days Discharge Instructions: Apply four layer compression as directed. WOUND #68: - Lower Leg Wound Laterality: Left, Anterior, Proximal Cleanser: Normal Saline (Home Health)  (Generic) 2 x Per Week/30 Days Discharge Instructions: Cleanse the wound with Normal Saline prior to applying a clean dressing using gauze sponges, not tissue or cotton balls. Peri-Wound Care: Sween Lotion (Moisturizing lotion) (Home Health) (Generic) 2 x Per Week/30 Days Discharge Instructions: Apply moisturizing lotion as directed Prim Dressing: KerraCel Ag Gelling Fiber Dressing, 4x5 in (silver alginate) (Home Health) (Generic) 2 x Per Week/30 Days ary Discharge Instructions: Apply silver alginate to wound bed as instructed Secondary Dressing: Woven Gauze Sponge, Non-Sterile 4x4 in (Home Health) (Generic) 2 x Per Week/30 Days Discharge Instructions: Apply over primary dressing as directed. Secondary Dressing: ABD Pad, 5x9 (Home Health) 2 x Per Week/30 Days Discharge Instructions: Apply over primary dressing as directed. Com pression Wrap: FourPress (4 layer compression wrap) (Home Health) (Generic) 2 x Per Week/30 Days Discharge Instructions: Apply four layer compression as directed. WOUND #73: - Lower Leg Wound Laterality: Right, Lateral Cleanser: Normal Saline (Home Health) (Generic) 2 x Per Week/30 Days Discharge Instructions: Cleanse the wound with Normal Saline prior to applying a clean dressing using gauze sponges, not tissue or cotton balls. Peri-Wound Care: Sween Lotion (Moisturizing lotion) (Home Health) (Generic) 2  x Per Week/30 Days Discharge Instructions: Apply moisturizing lotion as directed Prim Dressing: KerraCel Ag Gelling Fiber Dressing, 4x5 in (silver alginate) (Home Health) (Generic) 2 x Per Week/30 Days ary Discharge Instructions: Apply silver alginate to wound bed as instructed Secondary Dressing: Woven Gauze Sponge, Non-Sterile 4x4 in (Home Health) (Generic) 2 x Per Week/30 Days Discharge Instructions: Apply over primary dressing as directed. Secondary Dressing: ABD Pad, 5x9 (Home Health) 2 x Per Week/30 Days Discharge Instructions: Apply over primary dressing as  directed. Com pression Wrap: FourPress (4 layer compression wrap) (Home Health) (Generic) 2 x Per Week/30 Days Discharge Instructions: Apply four layer compression as directed. WOUND #74: - Lower Leg Wound Laterality: Left, Lateral Cleanser: Normal Saline (Home Health) (Generic) 2 x Per Week/30 Days Discharge Instructions: Cleanse the wound with Normal Saline prior to applying a clean dressing using gauze sponges, not tissue or cotton balls. Peri-Wound Care: Sween Lotion (Moisturizing lotion) (Home Health) (Generic) 2 x Per Week/30 Days Discharge Instructions: Apply moisturizing lotion as directed Prim Dressing: KerraCel Ag Gelling Fiber Dressing, 4x5 in (silver alginate) (Home Health) (Generic) 2 x Per Week/30 Days ary Discharge Instructions: Apply silver alginate to wound bed as instructed Secondary Dressing: Woven Gauze Sponge, Non-Sterile 4x4 in (Home Health) (Generic) 2 x Per Week/30 Days Discharge Instructions: Apply over primary dressing as directed. Secondary Dressing: ABD Pad, 5x9 (Home Health) 2 x Per Week/30 Days Discharge Instructions: Apply over primary dressing as directed. Com pression Wrap: FourPress (4 layer compression wrap) (Home Health) (Generic) 2 x Per Week/30 Days Discharge Instructions: Apply four layer compression as directed. WOUND #77: - Lower Leg Wound Laterality: Left, Lateral, Proximal Cleanser: Normal Saline (Home Health) (Generic) 2 x Per Week/30 Days Discharge Instructions: Cleanse the wound with Normal Saline prior to applying a clean dressing using gauze sponges, not tissue or cotton balls. Peri-Wound Care: Sween Lotion (Moisturizing lotion) (Home Health) (Generic) 2 x Per Week/30 Days Discharge Instructions: Apply moisturizing lotion as directed Prim Dressing: KerraCel Ag Gelling Fiber Dressing, 4x5 in (silver alginate) (Home Health) (Generic) 2 x Per Week/30 Days ary Discharge Instructions: Apply silver alginate to wound bed as instructed Secondary  Dressing: Woven Gauze Sponge, Non-Sterile 4x4 in (Home Health) (Generic) 2 x Per Week/30 Days Discharge Instructions: Apply over primary dressing as directed. Secondary Dressing: ABD Pad, 5x9 (Home Health) 2 x Per Week/30 Days Discharge Instructions: Apply over primary dressing as directed. Com pression Wrap: FourPress (4 layer compression wrap) (Home Health) (Generic) 2 x Per Week/30 Days Discharge Instructions: Apply four layer compression as directed. WOUND #78: - Lower Leg Wound Laterality: Right, Posterior Cleanser: Normal Saline (Home Health) (Generic) 2 x Per Week/30 Days Discharge Instructions: Cleanse the wound with Normal Saline prior to applying a clean dressing using gauze sponges, not tissue or cotton balls. Peri-Wound Care: Sween Lotion (Moisturizing lotion) (Home Health) (Generic) 2 x Per Week/30 Days Discharge Instructions: Apply moisturizing lotion as directed Prim Dressing: KerraCel Ag Gelling Fiber Dressing, 4x5 in (silver alginate) (Home Health) (Generic) 2 x Per Week/30 Days ary Discharge Instructions: Apply silver alginate to wound bed as instructed Secondary Dressing: Woven Gauze Sponge, Non-Sterile 4x4 in (Home Health) (Generic) 2 x Per Week/30 Days Discharge Instructions: Apply over primary dressing as directed. Secondary Dressing: ABD Pad, 5x9 (Home Health) 2 x Per Week/30 Days Discharge Instructions: Apply over primary dressing as directed. Com pression Wrap: FourPress (4 layer compression wrap) (Home Health) (Generic) 2 x Per Week/30 Days Discharge Instructions: Apply four layer compression as directed. 1. Bilateral lower leg wounds  secondary to chronic venous insufficiency with secondary lymphedema. 2. He does not use his compression pumps with any right liability and I think this is the major issue. 3. He does have home health changing the dressings. Their continued involvement has been a Savior otherwise we would not be able to wrap his legs and there would be a  tremendous breakdown. 4. No evidence of infection. 5. Based on clinical exam no signs of systemic fluid volume overload cardiac exam was otherwise normal Electronic Signature(s) Signed: 12/16/2020 7:47:56 AM By: Linton Ham MD Entered By: Linton Ham on 12/16/2020 07:46:20 -------------------------------------------------------------------------------- SuperBill Details Patient Name: Date of Service: [name] [name], [name] [name] RDIN W. 12/14/2020 Medical Record Number: [id number] Patient Account Number: [id number] Date of Birth/Sex: Treating RN: 11/13/1955 (65 y.o. 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

## 2020-12-17 NOTE — Progress Notes (Signed)
[name] [name] ([id number]) , Visit Report for 12/14/2020 Arrival Information Details Patient Name: Date of Service: [name] Ro Stuart W. 12/14/2020 1:00 PM Medical Record Number: [id number] Patient Account Number: [id number] Date of Birth/Sex: Treating RN: [birth year] (65 y.o. [name] Primary Care Annalynne Ibanez: [name] Other Clinician: Referring Varina Hulon: Treating Arkie Tagliaferro/Extender: [name] in Treatment: 57 Visit Information History Since Last Visit Added or deleted any medications: No Patient Arrived: [name]: No Arrival Time: 13:26 Had a fall or experienced change in No Accompanied By: alone activities of daily living that may affect Transfer Assistance: None risk of falls: Patient Identification Verified: Yes Signs or symptoms of abuse/neglect since last visito No Secondary Verification Process Completed: Yes Hospitalized since last visit: No Patient Requires Transmission-Based Precautions: No Implantable device outside of the clinic excluding No Patient Has Alerts: Yes cellular tissue based products placed in the center Patient Alerts: R ABI = 1.25 since last visit: L ABI = 1.27 Has Dressing in Place as Prescribed: Yes Has Compression in Place as Prescribed: Yes Pain Present Now: No Electronic Signature(s) Signed: 12/16/2020 5:46:28 PM By: Levan Hurst RN, BSN Entered By: Levan Hurst on 12/14/2020 13:27:24 -------------------------------------------------------------------------------- Compression Therapy Details Patient Name: Date of Service: [name] [name], HA [place] W. 12/14/2020 1:00 PM Medical Record Number: [id number] Patient Account Number: [id number] Date of Birth/Sex: Treating RN: [name] (65 y.o. [name], Lauren Primary Care Valori Hollenkamp: [name] Other Clinician: Referring Warrick Llera: Treating Stevon Gough/Extender: [name] in Treatment: 50 Compression Therapy  Performed for Wound Assessment: NonWound Condition Lymphedema - Bilateral Leg Performed By: Clinician Rhae Hammock, RN Compression Type: Four Layer Post Procedure Diagnosis Same as Pre-procedure Electronic Signature(s) Signed: 12/14/2020 5:10:40 PM By: Rhae Hammock RN Entered By: Rhae Hammock on 12/14/2020 14:06:29 -------------------------------------------------------------------------------- Encounter [name] Information Details Patient Name: Date of Service: [name] [name], [name] 12/14/2020 1:00 PM Medical Record Number: [id number] Patient Account Number: [id number] Date of Birth/Sex: Treating RN: [birth year][name] (65 y.o. [name] Primary Care [name]: [name] Other Clinician: Referring [name]: Treating [name]/Extender: [name] in Treatment: 14 Encounter [name] Information Items [name] Condition: Stable Ambulatory Status: [name] [name] Destination: Home Transportation: Private Auto Schedule Follow-up Appointment: Yes Clinical Summary of Care: Provided on 12/14/2020 Form Type Recipient Paper Patient Patient Electronic Signature(s) Signed: 12/14/2020 2:53:49 PM By: [name] Entered By: [name] on 12/14/2020 14:53:49 -------------------------------------------------------------------------------- Lower Extremity Assessment Details Patient Name: Date of Service: [name] [name], [name] 12/14/2020 1:00 PM Medical Record Number: [id number] Patient Account Number: [id number] Date of Birth/Sex: Treating RN: [birth year][name] (65 y.o. [name] Primary Care [name]: [name] Other Clinician: Referring [name]: Treating [name]/Extender: [name] in Treatment: 55 Edema Assessment Assessed: [name]: No] [name]: No] Edema: [Left: Yes] [Right: Yes] Calf Left: Right: Point of Measurement: 30 cm From Medial Instep 48 cm 54.4 cm Ankle Left: Right: Point of Measurement: 13 cm  From Medial Instep 26 cm 27 cm Vascular Assessment Pulses: Dorsalis Pedis Palpable: [Left:Yes] [Right:Yes] Electronic Signature(s) Signed: 12/16/2020 5:46:28 PM By: Levan Hurst RN, BSN Entered By: Levan Hurst on 12/14/2020 13:50:20 -------------------------------------------------------------------------------- Multi Wound Chart Details Patient Name: Date of Service: [name] [name], HA [place] W. 12/14/2020 1:00 PM Medical Record Number: [id number] Patient Account Number: [id number] Date of Birth/Sex: Treating RN: [name] [name] (65 y.o. [name] Primary Care [name]: [name] Other Clinician: Referring Judithann Villamar: Treating [name]/Extender: [name] in Treatment: 55 Vital Signs Height(in): 70  Pulse(bpm): 74 Weight(lbs): 360 Blood Pressure(mmHg): 172/92 Body Mass Index(BMI): 52 Temperature(F): 97.9 Respiratory Rate(breaths/min): 20 Photos: Left, Distal, Anterior Lower Leg Left, Proximal, Anterior Lower Leg Right, Lateral Lower Leg Wound Location: Gradually Appeared Gradually Appeared Gradually Appeared Wounding Event: Venous Leg Ulcer Lymphedema Lymphedema Primary Etiology: Lymphedema N/A N/A Secondary Etiology: Lymphedema, Sleep Apnea, Lymphedema, Sleep Apnea, Lymphedema, Sleep Apnea, Comorbid History: Congestive Heart Failure, Congestive Heart Failure, Congestive Heart Failure, Hypertension, Peripheral Venous Hypertension, Peripheral Venous Hypertension, Peripheral Venous Disease, Osteoarthritis Disease, Osteoarthritis Disease, Osteoarthritis 09/08/2019 03/23/2020 08/03/2020 Date Acquired: 55 38 17 Weeks of Treatment: Open Open Open Wound Status: Yes Yes Yes Clustered Wound: 1 10 4  Clustered Quantity: 1.6x0.5x0.1 5x7x0.1 9.5x2.5x0.1 Measurements L x W x D (cm) 0.628 27.489 18.653 A (cm) : rea 0.063 2.749 1.865 Volume (cm) : 99.90% -1415.40% 76.00% % Reduction in Area: 99.90% -1418.80% 76.00% % Reduction in  Volume: Full Thickness Without Exposed Full Thickness Without Exposed Full Thickness Without Exposed Classification: Support Structures Support Structures Support Structures Medium Medium Medium Exudate Amount: Serosanguineous Serosanguineous Serosanguineous Exudate Type: red, brown red, brown red, brown Exudate Color: Flat and Intact Flat and Intact Flat and Intact Wound Margin: Large (67-100%) Large (67-100%) Large (67-100%) Granulation Amount: Red Red Red Granulation Quality: None Present (0%) None Present (0%) None Present (0%) Necrotic Amount: Fat Layer (Subcutaneous Tissue): Yes Fat Layer (Subcutaneous Tissue): Yes Fat Layer (Subcutaneous Tissue): Yes Exposed Structures: Fascia: No Fascia: No Fascia: No Tendon: No Tendon: No Tendon: No Muscle: No Muscle: No Muscle: No Joint: No Joint: No Joint: No Bone: No Bone: No Bone: No Medium (34-66%) Small (1-33%) Small (1-33%) Epithelialization: Wound Number: [id number] Photos: No Photos No Photos Left, Lateral Lower Leg Left, Posterior Lower Leg Left, Dorsal Foot Wound Location: Gradually Appeared Gradually Appeared Gradually Appeared Wounding Event: Lymphedema Lymphedema Lymphedema Primary Etiology: N/A N/A N/A Secondary Etiology: Lymphedema, Sleep Apnea, Lymphedema, Sleep Apnea, Lymphedema, Sleep Apnea, Comorbid History: Congestive Heart Failure, Congestive Heart Failure, Congestive Heart Failure, Hypertension, Peripheral Venous Hypertension, Peripheral Venous Hypertension, Peripheral Venous Disease, Osteoarthritis Disease, Osteoarthritis Disease, Osteoarthritis 09/28/2020 09/28/2020 09/28/2020 Date Acquired: 11 11 11  Weeks of Treatment: Open Healed - Epithelialized Healed - Epithelialized Wound Status: No No No Clustered Wound: N/A N/A N/A Clustered Quantity: 6.6x5x0.1 0x0x0 0x0x0 Measurements L x W x D (cm) 25.918 0 0 A (cm) : rea 2.592 0 0 Volume (cm) : -4487.30% 100.00% 100.00% % Reduction in  Area: -2193.80% 100.00% 100.00% % Reduction in Volume: Full Thickness Without Exposed Full Thickness Without Exposed Full Thickness Without Exposed Classification: Support Structures Support Structures Support Structures Medium None Present Small Exudate Amount: Serosanguineous N/A Serosanguineous Exudate Type: red, brown N/A red, brown Exudate Color: Flat and Intact Flat and Intact Flat and Intact Wound Margin: Large (67-100%) None Present (0%) Large (67-100%) Granulation Amount: Pink, Friable N/A Pink, Pale Granulation Quality: Small (1-33%) None Present (0%) None Present (0%) Necrotic Amount: Fat Layer (Subcutaneous Tissue): Yes Fascia: No Fat Layer (Subcutaneous Tissue): Yes Exposed Structures: Fascia: No Fat Layer (Subcutaneous Tissue): No Fascia: No Tendon: No Tendon: No Tendon: No Muscle: No Muscle: No Muscle: No Joint: No Joint: No Joint: No Bone: No Bone: No Bone: No Small (1-33%) Large (67-100%) Large (67-100%) Epithelialization: Wound Number: [id number] Photos: No Photos Left, Proximal, Lateral Lower Leg Right, Posterior Lower Leg Left, Medial Lower Leg Wound Location: Gradually Appeared Gradually Appeared Gradually Appeared Wounding Event: Lymphedema Lymphedema Lymphedema Primary Etiology: N/A N/A N/A Secondary Etiology: Lymphedema, Sleep Apnea, Lymphedema, Sleep Apnea, Lymphedema, Sleep Apnea,  Comorbid History: Congestive Heart Failure, Congestive Heart Failure, Congestive Heart Failure, Hypertension, Peripheral Venous Hypertension, Peripheral Venous Hypertension, Peripheral Venous Disease, Osteoarthritis Disease, Osteoarthritis Disease, Osteoarthritis 11/16/2020 11/16/2020 11/16/2020 Date Acquired: 4 4 4  Weeks of Treatment: Open Open Healed - Epithelialized Wound Status: No No Yes Clustered Wound: N/A N/A 4 Clustered Quantity: 4.5x4x0.1 1x0.5x0.1 0x0x0 Measurements L x W x D (cm) 14.137 0.393 0 A (cm) : rea 1.414 0.039 0 Volume (cm)  : -295.60% 96.40% 100.00% % Reduction in Area: -296.10% 96.40% 100.00% % Reduction in Volume: Full Thickness Without Exposed Full Thickness Without Exposed Full Thickness Without Exposed Classification: Support Structures Support Structures Support Structures Medium Medium None Present Exudate Amount: Serosanguineous Serosanguineous N/A Exudate Type: red, brown red, brown N/A Exudate Color: Flat and Intact Flat and Intact Flat and Intact Wound Margin: Large (67-100%) Large (67-100%) None Present (0%) Granulation Amount: Red, Friable Red N/A Granulation Quality: None Present (0%) Small (1-33%) None Present (0%) Necrotic Amount: Fat Layer (Subcutaneous Tissue): Yes Fat Layer (Subcutaneous Tissue): Yes Fascia: No Exposed Structures: Fascia: No Fascia: No Fat Layer (Subcutaneous Tissue): No Tendon: No Tendon: No Tendon: No Muscle: No Muscle: No Muscle: No Joint: No Joint: No Joint: No Bone: No Bone: No Bone: No Small (1-33%) Medium (34-66%) Large (67-100%) Epithelialization: Treatment Notes Wound #65 (Lower Leg) Wound Laterality: Left, Anterior, Distal Cleanser Normal Saline [name] Instruction: Cleanse the wound with Normal Saline prior to applying a clean dressing using gauze sponges, not tissue or cotton balls. Peri-Wound Care Sween Lotion (Moisturizing lotion) [name] Instruction: Apply moisturizing lotion as directed Topical Primary Dressing KerraCel Ag Gelling Fiber Dressing, 4x5 in (silver alginate) [name] Instruction: Apply silver alginate to wound bed as instructed Secondary Dressing Woven Gauze Sponge, Non-Sterile 4x4 in [name] Instruction: Apply over primary dressing as directed. ABD Pad, 5x9 [name] Instruction: Apply over primary dressing as directed. Secured With Compression Wrap FourPress (4 layer compression wrap) [name] Instruction: Apply four layer compression as directed. Compression Stockings Add-Ons Wound #68 (Lower Leg)  Wound Laterality: Left, Anterior, Proximal Cleanser Normal Saline [name] Instruction: Cleanse the wound with Normal Saline prior to applying a clean dressing using gauze sponges, not tissue or cotton balls. Peri-Wound Care Sween Lotion (Moisturizing lotion) [name] Instruction: Apply moisturizing lotion as directed Topical Primary Dressing KerraCel Ag Gelling Fiber Dressing, 4x5 in (silver alginate) [name] Instruction: Apply silver alginate to wound bed as instructed Secondary Dressing Woven Gauze Sponge, Non-Sterile 4x4 in [name] Instruction: Apply over primary dressing as directed. ABD Pad, 5x9 [name] Instruction: Apply over primary dressing as directed. Secured With Compression Wrap FourPress (4 layer compression wrap) [name] Instruction: Apply four layer compression as directed. Compression Stockings Add-Ons Wound #73 (Lower Leg) Wound Laterality: Right, Lateral Cleanser Normal Saline [name] Instruction: Cleanse the wound with Normal Saline prior to applying a clean dressing using gauze sponges, not tissue or cotton balls. Peri-Wound Care Sween Lotion (Moisturizing lotion) [name] Instruction: Apply moisturizing lotion as directed Topical Primary Dressing KerraCel Ag Gelling Fiber Dressing, 4x5 in (silver alginate) [name] Instruction: Apply silver alginate to wound bed as instructed Secondary Dressing Woven Gauze Sponge, Non-Sterile 4x4 in [name] Instruction: Apply over primary dressing as directed. ABD Pad, 5x9 [name] Instruction: Apply over primary dressing as directed. Secured With Compression Wrap FourPress (4 layer compression wrap) [name] Instruction: Apply four layer compression as directed. Compression Stockings Add-Ons Wound #74 (Lower Leg) Wound Laterality: Left, Lateral Cleanser Normal Saline [name] Instruction: Cleanse the wound with Normal Saline prior to applying a clean dressing using gauze sponges, not tissue or  cotton balls. Peri-Wound Care Sween Lotion (Moisturizing lotion) [name] Instruction: Apply moisturizing lotion as directed Topical Primary Dressing KerraCel Ag Gelling Fiber Dressing, 4x5 in (silver alginate) [name] Instruction: Apply silver alginate to wound bed as instructed Secondary Dressing Woven Gauze Sponge, Non-Sterile 4x4 in [name] Instruction: Apply over primary dressing as directed. ABD Pad, 5x9 [name] Instruction: Apply over primary dressing as directed. Secured With Compression Wrap FourPress (4 layer compression wrap) [name] Instruction: Apply four layer compression as directed. Compression Stockings Add-Ons Wound #76 (Foot) Wound Laterality: Dorsal, Left Cleanser Peri-Wound Care Topical Primary Dressing Secondary Dressing Secured With Compression Wrap Compression Stockings Add-Ons Wound #77 (Lower Leg) Wound Laterality: Left, Lateral, Proximal Cleanser Normal Saline [name] Instruction: Cleanse the wound with Normal Saline prior to applying a clean dressing using gauze sponges, not tissue or cotton balls. Peri-Wound Care Sween Lotion (Moisturizing lotion) [name] Instruction: Apply moisturizing lotion as directed Topical Primary Dressing KerraCel Ag Gelling Fiber Dressing, 4x5 in (silver alginate) [name] Instruction: Apply silver alginate to wound bed as instructed Secondary Dressing Woven Gauze Sponge, Non-Sterile 4x4 in [name] Instruction: Apply over primary dressing as directed. ABD Pad, 5x9 [name] Instruction: Apply over primary dressing as directed. Secured With Compression Wrap FourPress (4 layer compression wrap) [name] Instruction: Apply four layer compression as directed. Compression Stockings Add-Ons Wound #78 (Lower Leg) Wound Laterality: Right, Posterior Cleanser Normal Saline [name] Instruction: Cleanse the wound with Normal Saline prior to applying a clean dressing using gauze sponges, not tissue or  cotton balls. Peri-Wound Care Sween Lotion (Moisturizing lotion) [name] Instruction: Apply moisturizing lotion as directed Topical Primary Dressing KerraCel Ag Gelling Fiber Dressing, 4x5 in (silver alginate) [name] Instruction: Apply silver alginate to wound bed as instructed Secondary Dressing Woven Gauze Sponge, Non-Sterile 4x4 in [name] Instruction: Apply over primary dressing as directed. ABD Pad, 5x9 [name] Instruction: Apply over primary dressing as directed. Secured With Compression Wrap FourPress (4 layer compression wrap) [name] Instruction: Apply four layer compression as directed. Compression Stockings Add-Ons Electronic Signature(s) Signed: 12/16/2020 7:47:56 AM By: Linton Ham MD Signed: 12/17/2020 5:01:26 PM By: Rhae Hammock RN Entered By: Linton Ham on 12/16/2020 07:41:21 -------------------------------------------------------------------------------- Multi-Disciplinary Care Plan Details Patient Name: Date of Service: [name] [name], [name] 12/14/2020 1:00 PM Medical Record Number: [id number] Patient Account Number: [id number] Date of Birth/Sex: Treating RN: [birth year][name] (65 y.o. [name], Lauren Primary Care Jahdai Padovano: Other Clinician: [name] Referring Alphonse Asbridge: Treating Eldean Klatt/Extender: [name] in Treatment: 66 Active Inactive Wound/Skin Impairment Nursing Diagnoses: Knowledge deficit related to ulceration/compromised skin integrity Goals: Patient/caregiver will verbalize understanding of skin care regimen Date Initiated: 11/25/2019 Target Resolution Date: 01/02/2021 Goal Status: Active Ulcer/skin breakdown will have a volume reduction of 30% by week 4 Date Initiated: 11/25/2019 Date Inactivated: 01/06/2020 Target Resolution Date: 12/26/2019 Goal Status: Met Ulcer/skin breakdown will have a volume reduction of 50% by week 8 Date Initiated: 01/06/2020 Date Inactivated: 02/17/2020 Target Resolution  Date: 02/05/2020 Goal Status: Met Interventions: Assess patient/caregiver ability to obtain necessary supplies Assess patient/caregiver ability to perform ulcer/skin care regimen upon admission and as needed Assess ulceration(s) every visit Notes: Electronic Signature(s) Signed: 12/14/2020 5:10:40 PM By: Rhae Hammock RN Entered By: Rhae Hammock on 12/14/2020 13:42:31 -------------------------------------------------------------------------------- Pain Assessment Details Patient Name: Date of Service: [name] [name], HA [place] W. 12/14/2020 1:00 PM Medical Record Number: [id number] Patient Account Number: [id number] Date of Birth/Sex: Treating RN: [birth year] [name] (65 y.o. [name] Primary Care Vyom Brass: [name] Other Clinician: Referring Lucita Montoya: Treating Coltyn Hanning/Extender: [name] in Treatment: [phone]  Active Problems Location of Pain Severity and Description of Pain Patient Has Paino No Site Locations Pain Management and Medication Current Pain Management: Electronic Signature(s) Signed: 12/16/2020 5:46:28 PM By: Levan Hurst RN, BSN Entered By: Levan Hurst on 12/14/2020 13:27:51 -------------------------------------------------------------------------------- Patient/Caregiver Education Details Patient Name: Date of Service: JA RRETT, [name] 3/29/2022andnbsp1:00 PM Medical Record Number: [id number] Patient Account Number: [id number] Date of Birth/Gender: Treating RN: [birth year][name] (65 y.o. [name] Primary Care Physician: [name] Other Clinician: Referring Physician: Treating Physician/Extender: [name] in Treatment: 76 Education Assessment Education Provided To: Patient Education Topics Provided Wound/Skin Impairment: Methods: Explain/Verbal Responses: State content correctly [employer]) Signed: 12/14/2020 5:10:40 PM By: Rhae Hammock RN Entered By: Rhae Hammock on  12/14/2020 13:44:20 -------------------------------------------------------------------------------- Wound Assessment Details Patient Name: Date of Service: [name] [name], HA [place] W. 12/14/2020 1:00 PM Medical Record Number: [id number] Patient Account Number: [id number] Date of Birth/Sex: Treating RN: [birth year][name] (65 y.o. [name] Primary Care Cadel Stairs: [name] Other Clinician: Referring Aquarius Tremper: Treating Kuper Rennels/Extender: [name] in Treatment: 78 Wound Status Wound Number: 65 Primary Venous Leg Ulcer Etiology: Wound Location: Left, Distal, Anterior Lower Leg Secondary Lymphedema Wounding Event: Gradually Appeared Etiology: Date Acquired: 09/08/2019 Wound Open Weeks Of Treatment: 55 Status: Clustered Wound: Yes Comorbid Lymphedema, Sleep Apnea, Congestive Heart Failure, History: Hypertension, Peripheral Venous Disease, Osteoarthritis Photos Wound Measurements Length: (cm) 1.6 Width: (cm) 0.5 Depth: (cm) 0.1 Clustered Quantity: 1 Area: (cm) 0.628 Volume: (cm) 0.063 % Reduction in Area: 99.9% % Reduction in Volume: 99.9% Epithelialization: Medium (34-66%) Tunneling: No Undermining: No Wound Description Classification: Full Thickness Without Exposed Support Structures Wound Margin: Flat and Intact Exudate Amount: Medium Exudate Type: Serosanguineous Exudate Color: red, brown Foul Odor After Cleansing: No Slough/Fibrino No Wound Bed Granulation Amount: Large (67-100%) Exposed Structure Granulation Quality: Red Fascia Exposed: No Necrotic Amount: None Present (0%) Fat Layer (Subcutaneous Tissue) Exposed: Yes Tendon Exposed: No Muscle Exposed: No Joint Exposed: No Bone Exposed: No Treatment Notes Wound #65 (Lower Leg) Wound Laterality: Left, Anterior, Distal Cleanser Normal Saline [name] Instruction: Cleanse the wound with Normal Saline prior to applying a clean dressing using gauze sponges, not tissue or cotton  balls. Peri-Wound Care Sween Lotion (Moisturizing lotion) [name] Instruction: Apply moisturizing lotion as directed Topical Primary Dressing KerraCel Ag Gelling Fiber Dressing, 4x5 in (silver alginate) [name] Instruction: Apply silver alginate to wound bed as instructed Secondary Dressing Woven Gauze Sponge, Non-Sterile 4x4 in [name] Instruction: Apply over primary dressing as directed. ABD Pad, 5x9 [name] Instruction: Apply over primary dressing as directed. Secured With Compression Wrap FourPress (4 layer compression wrap) [name] Instruction: Apply four layer compression as directed. Compression Stockings Add-Ons Electronic Signature(s) Signed: 12/15/2020 9:05:34 AM By: Sandre Kitty Signed: 12/16/2020 5:46:28 PM By: Levan Hurst RN, BSN Entered By: Sandre Kitty on 12/14/2020 16:43:36 -------------------------------------------------------------------------------- Wound Assessment Details Patient Name: Date of Service: [name] [name], HA [place] W. 12/14/2020 1:00 PM Medical Record Number: [id number] Patient Account Number: [id number] Date of Birth/Sex: Treating RN: [birth year][name] (65 y.o. [name] Primary Care Saraiya Kozma: [name] Other Clinician: Referring Josecarlos Harriott: Treating Jameia Makris/Extender: [name] in Treatment: 69 Wound Status Wound Number: 68 Primary Lymphedema Etiology: Wound Location: Left, Proximal, Anterior Lower Leg Wound Open Wounding Event: Gradually Appeared Status: Date Acquired: 03/23/2020 Comorbid Lymphedema, Sleep Apnea, Congestive Heart Failure, Weeks Of Treatment: 38 History: Hypertension, Peripheral Venous Disease, Osteoarthritis Clustered Wound: Yes Photos Wound Measurements Length: (cm) 5 Width: (cm) 7 Depth: (cm) 0.1 Clustered Quantity: 10  Area: (cm) 27.489 Volume: (cm) 2.749 % Reduction in Area: -1415.4% % Reduction in Volume: -1418.8% Epithelialization: Small (1-33%) Tunneling:  No Undermining: No Wound Description Classification: Full Thickness Without Exposed Support Structures Wound Margin: Flat and Intact Exudate Amount: Medium Exudate Type: Serosanguineous Exudate Color: red, brown Foul Odor After Cleansing: No Slough/Fibrino No Wound Bed Granulation Amount: Large (67-100%) Exposed Structure Granulation Quality: Red Fascia Exposed: No Necrotic Amount: None Present (0%) Fat Layer (Subcutaneous Tissue) Exposed: Yes Tendon Exposed: No Muscle Exposed: No Joint Exposed: No Bone Exposed: No Treatment Notes Wound #68 (Lower Leg) Wound Laterality: Left, Anterior, Proximal Cleanser Normal Saline [name] Instruction: Cleanse the wound with Normal Saline prior to applying a clean dressing using gauze sponges, not tissue or cotton balls. Peri-Wound Care Sween Lotion (Moisturizing lotion) [name] Instruction: Apply moisturizing lotion as directed Topical Primary Dressing KerraCel Ag Gelling Fiber Dressing, 4x5 in (silver alginate) [name] Instruction: Apply silver alginate to wound bed as instructed Secondary Dressing Woven Gauze Sponge, Non-Sterile 4x4 in [name] Instruction: Apply over primary dressing as directed. ABD Pad, 5x9 [name] Instruction: Apply over primary dressing as directed. Secured With Compression Wrap FourPress (4 layer compression wrap) [name] Instruction: Apply four layer compression as directed. Compression Stockings Add-Ons Electronic Signature(s) Signed: 12/15/2020 9:05:34 AM By: Sandre Kitty Signed: 12/16/2020 5:46:28 PM By: Levan Hurst RN, BSN Entered By: Sandre Kitty on 12/14/2020 16:42:51 -------------------------------------------------------------------------------- Wound Assessment Details Patient Name: Date of Service: [name] [name], HA [place] W. 12/14/2020 1:00 PM Medical Record Number: [id number] Patient Account Number: [id number] Date of Birth/Sex: Treating RN: [birth year] (65 y.o. [name] Primary Care Jalesia Loudenslager: [name] Other Clinician: Referring Nayelly Laughman: Treating Bruk Tumolo/Extender: [name] in Treatment: 11 Wound Status Wound Number: 73 Primary Lymphedema Etiology: Wound Location: Right, Lateral Lower Leg Wound Open Wounding Event: Gradually Appeared Status: Date Acquired: 08/03/2020 Comorbid Lymphedema, Sleep Apnea, Congestive Heart Failure, Weeks Of Treatment: 17 History: Hypertension, Peripheral Venous Disease, Osteoarthritis Clustered Wound: Yes Photos Wound Measurements Length: (cm) 9.5 Width: (cm) 2.5 Depth: (cm) 0.1 Clustered Quantity: 4 Area: (cm) 18.653 Volume: (cm) 1.865 % Reduction in Area: 76% % Reduction in Volume: 76% Epithelialization: Small (1-33%) Tunneling: No Undermining: No Wound Description Classification: Full Thickness Without Exposed Support Structures Wound Margin: Flat and Intact Exudate Amount: Medium Exudate Type: Serosanguineous Exudate Color: red, brown Foul Odor After Cleansing: No Slough/Fibrino No Wound Bed Granulation Amount: Large (67-100%) Exposed Structure Granulation Quality: Red Fascia Exposed: No Necrotic Amount: None Present (0%) Fat Layer (Subcutaneous Tissue) Exposed: Yes Tendon Exposed: No Muscle Exposed: No Joint Exposed: No Bone Exposed: No Treatment Notes Wound #73 (Lower Leg) Wound Laterality: Right, Lateral Cleanser Normal Saline [name] Instruction: Cleanse the wound with Normal Saline prior to applying a clean dressing using gauze sponges, not tissue or cotton balls. Peri-Wound Care Sween Lotion (Moisturizing lotion) [name] Instruction: Apply moisturizing lotion as directed Topical Primary Dressing KerraCel Ag Gelling Fiber Dressing, 4x5 in (silver alginate) [name] Instruction: Apply silver alginate to wound bed as instructed Secondary Dressing Woven Gauze Sponge, Non-Sterile 4x4 in [name] Instruction: Apply over primary dressing as  directed. ABD Pad, 5x9 [name] Instruction: Apply over primary dressing as directed. Secured With Compression Wrap FourPress (4 layer compression wrap) [name] Instruction: Apply four layer compression as directed. Compression Stockings Add-Ons Electronic Signature(s) Signed: 12/15/2020 9:05:34 AM By: Sandre Kitty Signed: 12/16/2020 5:46:28 PM By: Levan Hurst RN, BSN Entered By: Sandre Kitty on 12/14/2020 16:46:26 -------------------------------------------------------------------------------- Wound Assessment Details Patient Name: Date of Service: [name] [name], HA [place] W. 12/14/2020 1:00  PM Medical Record Number: [id number] Patient Account Number: [id number] Date of Birth/Sex: Treating RN: [birth year] [name] (65 y.o. [name] Primary Care Wilferd Ritson: [name] Other Clinician: Referring Annasofia Pohl: Treating Jearldean Gutt/Extender: [name] in Treatment: 59 Wound Status Wound Number: 74 Primary Lymphedema Etiology: Wound Location: Left, Lateral Lower Leg Wound Open Wounding Event: Gradually Appeared Status: Date Acquired: 09/28/2020 Comorbid Lymphedema, Sleep Apnea, Congestive Heart Failure, Weeks Of Treatment: 11 History: Hypertension, Peripheral Venous Disease, Osteoarthritis Clustered Wound: No Photos Wound Measurements Length: (cm) 6.6 Width: (cm) 5 Depth: (cm) 0.1 Area: (cm) 25.918 Volume: (cm) 2.592 % Reduction in Area: -4487.3% % Reduction in Volume: -2193.8% Epithelialization: Small (1-33%) Tunneling: No Undermining: No Wound Description Classification: Full Thickness Without Exposed Support Structures Wound Margin: Flat and Intact Exudate Amount: Medium Exudate Type: Serosanguineous Exudate Color: red, brown Foul Odor After Cleansing: No Slough/Fibrino Yes Wound Bed Granulation Amount: Large (67-100%) Exposed Structure Granulation Quality: Pink, Friable Fascia Exposed: No Necrotic Amount: Small (1-33%) Fat Layer  (Subcutaneous Tissue) Exposed: Yes Necrotic Quality: Adherent Slough Tendon Exposed: No Muscle Exposed: No Joint Exposed: No Bone Exposed: No Treatment Notes Wound #74 (Lower Leg) Wound Laterality: Left, Lateral Cleanser Normal Saline [name] Instruction: Cleanse the wound with Normal Saline prior to applying a clean dressing using gauze sponges, not tissue or cotton balls. Peri-Wound Care Sween Lotion (Moisturizing lotion) [name] Instruction: Apply moisturizing lotion as directed Topical Primary Dressing KerraCel Ag Gelling Fiber Dressing, 4x5 in (silver alginate) [name] Instruction: Apply silver alginate to wound bed as instructed Secondary Dressing Woven Gauze Sponge, Non-Sterile 4x4 in [name] Instruction: Apply over primary dressing as directed. ABD Pad, 5x9 [name] Instruction: Apply over primary dressing as directed. Secured With Compression Wrap FourPress (4 layer compression wrap) [name] Instruction: Apply four layer compression as directed. Compression Stockings Add-Ons Electronic Signature(s) Signed: 12/15/2020 9:05:34 AM By: Sandre Kitty Signed: 12/16/2020 5:46:28 PM By: Levan Hurst RN, BSN Entered By: Sandre Kitty on 12/14/2020 16:44:34 -------------------------------------------------------------------------------- Wound Assessment Details Patient Name: Date of Service: [name] [name], HA [place] W. 12/14/2020 1:00 PM Medical Record Number: [id number] Patient Account Number: [id number] Date of Birth/Sex: Treating RN: [birth year] (65 y.o. [name] Primary Care Lashan Macias: [name] Other Clinician: Referring Decker Cogdell: Treating Amarylis Rovito/Extender: [name] in Treatment: 37 Wound Status Wound Number: 75 Primary Lymphedema Etiology: Wound Location: Left, Posterior Lower Leg Wound Healed - Epithelialized Wounding Event: Gradually Appeared Status: Date Acquired: 09/28/2020 Comorbid Lymphedema, Sleep Apnea,  Congestive Heart Failure, Weeks Of Treatment: 11 History: Hypertension, Peripheral Venous Disease, Osteoarthritis Clustered Wound: No Wound Measurements Length: (cm) Width: (cm) Depth: (cm) Area: (cm) Volume: (cm) 0 % Reduction in Area: 100% 0 % Reduction in Volume: 100% 0 Epithelialization: Large (67-100%) 0 Tunneling: No 0 Undermining: No Wound Description Classification: Full Thickness Without Exposed Support Structures Wound Margin: Flat and Intact Exudate Amount: None Present Foul Odor After Cleansing: No Slough/Fibrino No Wound Bed Granulation Amount: None Present (0%) Exposed Structure Necrotic Amount: None Present (0%) Fascia Exposed: No Fat Layer (Subcutaneous Tissue) Exposed: No Tendon Exposed: No Muscle Exposed: No Joint Exposed: No Bone Exposed: No Electronic Signature(s) Signed: 12/16/2020 5:46:28 PM By: Levan Hurst RN, BSN Entered By: Levan Hurst on 12/14/2020 13:43:28 -------------------------------------------------------------------------------- Wound Assessment Details Patient Name: Date of Service: [name] [name], HA [place] W. 12/14/2020 1:00 PM Medical Record Number: [id number] Patient Account Number: [id number] Date of Birth/Sex: Treating RN: [name] (65 y.o. [name], Lauren Primary Care Keane Martelli: [name] Other Clinician: Referring Aishwarya Shiplett: Treating Trong Gosling/Extender: [name] in  Treatment: 55 Wound Status Wound Number: 76 Primary Lymphedema Etiology: Wound Location: Left, Dorsal Foot Wound Healed - Epithelialized Wounding Event: Gradually Appeared Status: Date Acquired: 09/28/2020 Comorbid Lymphedema, Sleep Apnea, Congestive Heart Failure, Weeks Of Treatment: 11 History: Hypertension, Peripheral Venous Disease, Osteoarthritis Clustered Wound: No Wound Measurements Length: (cm) Width: (cm) Depth: (cm) Area: (cm) Volume: (cm) 0 % Reduction in Area: 100% 0 % Reduction in Volume: 100% 0  Epithelialization: Large (67-100%) 0 Tunneling: No 0 Undermining: No Wound Description Classification: Full Thickness Without Exposed Support Structures Wound Margin: Flat and Intact Exudate Amount: Small Exudate Type: Serosanguineous Exudate Color: red, brown Foul Odor After Cleansing: No Slough/Fibrino No Wound Bed Granulation Amount: Large (67-100%) Exposed Structure Granulation Quality: Pink, Pale Fascia Exposed: No Necrotic Amount: None Present (0%) Fat Layer (Subcutaneous Tissue) Exposed: Yes Tendon Exposed: No Muscle Exposed: No Joint Exposed: No Bone Exposed: No Treatment Notes Wound #76 (Foot) Wound Laterality: Dorsal, Left Cleanser Peri-Wound Care Topical Primary Dressing Secondary Dressing Secured With Compression Wrap Compression Stockings Add-Ons Electronic Signature(s) Signed: 12/14/2020 5:10:40 PM By: Rhae Hammock RN Entered By: Rhae Hammock on 12/14/2020 14:06:04 -------------------------------------------------------------------------------- Wound Assessment Details Patient Name: Date of Service: [name] [name], [name] 12/14/2020 1:00 PM Medical Record Number: [id number] Patient Account Number: [id number] Date of Birth/Sex: Treating RN: [name] (65 y.o. [name] Primary Care Decklan Mau: [name] Other Clinician: Referring Nayla Dias: Treating Yazmin Locher/Extender: [name] in Treatment: 37 Wound Status Wound Number: 77 Primary Lymphedema Etiology: Wound Location: Left, Proximal, Lateral Lower Leg Wound Open Wounding Event: Gradually Appeared Status: Date Acquired: 11/16/2020 Comorbid Lymphedema, Sleep Apnea, Congestive Heart Failure, Weeks Of Treatment: 4 History: Hypertension, Peripheral Venous Disease, Osteoarthritis Clustered Wound: No Photos Wound Measurements Length: (cm) 4.5 Width: (cm) 4 Depth: (cm) 0.1 Area: (cm) 14.137 Volume: (cm) 1.414 % Reduction in Area: -295.6% % Reduction in  Volume: -296.1% Epithelialization: Small (1-33%) Tunneling: No Undermining: No Wound Description Classification: Full Thickness Without Exposed Support Structures Wound Margin: Flat and Intact Exudate Amount: Medium Exudate Type: Serosanguineous Exudate Color: red, brown Foul Odor After Cleansing: No Slough/Fibrino No Wound Bed Granulation Amount: Large (67-100%) Exposed Structure Granulation Quality: Red, Friable Fascia Exposed: No Necrotic Amount: None Present (0%) Fat Layer (Subcutaneous Tissue) Exposed: Yes Tendon Exposed: No Muscle Exposed: No Joint Exposed: No Bone Exposed: No Treatment Notes Wound #77 (Lower Leg) Wound Laterality: Left, Lateral, Proximal Cleanser Normal Saline [name] Instruction: Cleanse the wound with Normal Saline prior to applying a clean dressing using gauze sponges, not tissue or cotton balls. Peri-Wound Care Sween Lotion (Moisturizing lotion) [name] Instruction: Apply moisturizing lotion as directed Topical Primary Dressing KerraCel Ag Gelling Fiber Dressing, 4x5 in (silver alginate) [name] Instruction: Apply silver alginate to wound bed as instructed Secondary Dressing Woven Gauze Sponge, Non-Sterile 4x4 in [name] Instruction: Apply over primary dressing as directed. ABD Pad, 5x9 [name] Instruction: Apply over primary dressing as directed. Secured With Compression Wrap FourPress (4 layer compression wrap) [name] Instruction: Apply four layer compression as directed. Compression Stockings Add-Ons Electronic Signature(s) Signed: 12/15/2020 9:05:34 AM By: Sandre Kitty Signed: 12/16/2020 5:46:28 PM By: Levan Hurst RN, BSN Entered By: Sandre Kitty on 12/14/2020 16:45:50 -------------------------------------------------------------------------------- Wound Assessment Details Patient Name: Date of Service: [name] [name], HA [place] W. 12/14/2020 1:00 PM Medical Record Number: [id number] Patient Account Number:  [id number] Date of Birth/Sex: Treating RN: [name] (65 y.o. [name] Primary Care Hesper Venturella: [name] Other Clinician: Referring Enrico Eaddy: Treating Jonmichael Beadnell/Extender: [name] in Treatment: 41 Wound Status Wound Number:  78 Primary Lymphedema Etiology: Wound Location: Right, Posterior Lower Leg Wound Open Wounding Event: Gradually Appeared Status: Date Acquired: 11/16/2020 Comorbid Lymphedema, Sleep Apnea, Congestive Heart Failure, Weeks Of Treatment: 4 History: Hypertension, Peripheral Venous Disease, Osteoarthritis Clustered Wound: No Photos Wound Measurements Length: (cm) 1 Width: (cm) 0.5 Depth: (cm) 0.1 Area: (cm) 0.393 Volume: (cm) 0.039 % Reduction in Area: 96.4% % Reduction in Volume: 96.4% Epithelialization: Medium (34-66%) Tunneling: No Undermining: No Wound Description Classification: Full Thickness Without Exposed Support Structures Wound Margin: Flat and Intact Exudate Amount: Medium Exudate Type: Serosanguineous Exudate Color: red, brown Foul Odor After Cleansing: No Slough/Fibrino Yes Wound Bed Granulation Amount: Large (67-100%) Exposed Structure Granulation Quality: Red Fascia Exposed: No Necrotic Amount: Small (1-33%) Fat Layer (Subcutaneous Tissue) Exposed: Yes Necrotic Quality: Adherent Slough Tendon Exposed: No Muscle Exposed: No Joint Exposed: No Bone Exposed: No Treatment Notes Wound #78 (Lower Leg) Wound Laterality: Right, Posterior Cleanser Normal Saline [name] Instruction: Cleanse the wound with Normal Saline prior to applying a clean dressing using gauze sponges, not tissue or cotton balls. Peri-Wound Care Sween Lotion (Moisturizing lotion) [name] Instruction: Apply moisturizing lotion as directed Topical Primary Dressing KerraCel Ag Gelling Fiber Dressing, 4x5 in (silver alginate) [name] Instruction: Apply silver alginate to wound bed as instructed Secondary Dressing Woven Gauze  Sponge, Non-Sterile 4x4 in [name] Instruction: Apply over primary dressing as directed. ABD Pad, 5x9 [name] Instruction: Apply over primary dressing as directed. Secured With Compression Wrap FourPress (4 layer compression wrap) [name] Instruction: Apply four layer compression as directed. Compression Stockings Add-Ons Electronic Signature(s) Signed: 12/15/2020 9:05:34 AM By: Sandre Kitty Signed: 12/16/2020 5:46:28 PM By: Levan Hurst RN, BSN Entered By: Sandre Kitty on 12/14/2020 16:46:58 -------------------------------------------------------------------------------- Wound Assessment Details Patient Name: Date of Service: [name] [name], HA [place] W. 12/14/2020 1:00 PM Medical Record Number: [id number] Patient Account Number: [id number] Date of Birth/Sex: Treating RN: [birth year][name] (65 y.o. [name] Primary Care Niomie Englert: [name] Other Clinician: Referring Jode Lippe: Treating Adrianne Shackleton/Extender: [name] in Treatment: 15 Wound Status Wound Number: 79 Primary Lymphedema Etiology: Wound Location: Left, Medial Lower Leg Wound Healed - Epithelialized Wounding Event: Gradually Appeared Status: Date Acquired: 11/16/2020 Comorbid Lymphedema, Sleep Apnea, Congestive Heart Failure, Weeks Of Treatment: 4 History: Hypertension, Peripheral Venous Disease, Osteoarthritis Clustered Wound: Yes Wound Measurements Length: (cm) Width: (cm) Depth: (cm) Clustered Quantity: Area: (cm) Volume: (cm) 0 % Reduction in Area: 100% 0 % Reduction in Volume: 100% 0 Epithelialization: Large (67-100%) 4 Tunneling: No 0 0 Wound Description Classification: Full Thickness Without Exposed Support Structures Wound Margin: Flat and Intact Exudate Amount: None Present Foul Odor After Cleansing: No Slough/Fibrino No Wound Bed Granulation Amount: None Present (0%) Exposed Structure Necrotic Amount: None Present (0%) Fascia Exposed: No Fat Layer  (Subcutaneous Tissue) Exposed: No Tendon Exposed: No Muscle Exposed: No Joint Exposed: No Bone Exposed: No Electronic Signature(s) Signed: 12/16/2020 5:46:28 PM By: Levan Hurst RN, BSN Entered By: Levan Hurst on 12/14/2020 13:44:06 -------------------------------------------------------------------------------- [place] Details Patient Name: Date of Service: [name] [name], HA [place] W. 12/14/2020 1:00 PM Medical Record Number: [id number] Patient Account Number: [id number] Date of Birth/Sex: Treating RN: [birth year][name] (65 y.o. [name] Primary Care Neve Branscomb: [name] Other Clinician: Referring Jaquavious Mercer: Treating Kaito Schulenburg/Extender: [name] in Treatment: 55 Vital Signs Time Taken: 13:27 Temperature (F): 97.9 Height (in): 70 Pulse (bpm): 74 Weight (lbs): 360 Respiratory Rate (breaths/min): 20 Body Mass Index (BMI): 51.6 Blood Pressure (mmHg): 172/92 Reference Range: 80 - 120 mg / dl Electronic Signature(s) Signed: 12/16/2020  5:46:28 PM By: Levan Hurst RN, BSN Entered By: Levan Hurst on 12/14/2020 13:27:46

## 2020-12-20 DIAGNOSIS — I110 Hypertensive heart disease with heart failure: Secondary | ICD-10-CM | POA: Diagnosis not present

## 2020-12-20 DIAGNOSIS — L97821 Non-pressure chronic ulcer of other part of left lower leg limited to breakdown of skin: Secondary | ICD-10-CM | POA: Diagnosis not present

## 2020-12-20 DIAGNOSIS — E669 Obesity, unspecified: Secondary | ICD-10-CM | POA: Diagnosis not present

## 2020-12-20 DIAGNOSIS — I87333 Chronic venous hypertension (idiopathic) with ulcer and inflammation of bilateral lower extremity: Secondary | ICD-10-CM | POA: Diagnosis not present

## 2020-12-20 DIAGNOSIS — G4733 Obstructive sleep apnea (adult) (pediatric): Secondary | ICD-10-CM | POA: Diagnosis not present

## 2020-12-20 DIAGNOSIS — I5032 Chronic diastolic (congestive) heart failure: Secondary | ICD-10-CM | POA: Diagnosis not present

## 2020-12-24 DIAGNOSIS — I110 Hypertensive heart disease with heart failure: Secondary | ICD-10-CM | POA: Diagnosis not present

## 2020-12-24 DIAGNOSIS — E669 Obesity, unspecified: Secondary | ICD-10-CM | POA: Diagnosis not present

## 2020-12-24 DIAGNOSIS — G4733 Obstructive sleep apnea (adult) (pediatric): Secondary | ICD-10-CM | POA: Diagnosis not present

## 2020-12-24 DIAGNOSIS — L97821 Non-pressure chronic ulcer of other part of left lower leg limited to breakdown of skin: Secondary | ICD-10-CM | POA: Diagnosis not present

## 2020-12-24 DIAGNOSIS — I87333 Chronic venous hypertension (idiopathic) with ulcer and inflammation of bilateral lower extremity: Secondary | ICD-10-CM | POA: Diagnosis not present

## 2020-12-24 DIAGNOSIS — I5032 Chronic diastolic (congestive) heart failure: Secondary | ICD-10-CM | POA: Diagnosis not present

## 2020-12-28 DIAGNOSIS — G4733 Obstructive sleep apnea (adult) (pediatric): Secondary | ICD-10-CM | POA: Diagnosis not present

## 2020-12-28 DIAGNOSIS — E669 Obesity, unspecified: Secondary | ICD-10-CM | POA: Diagnosis not present

## 2020-12-28 DIAGNOSIS — I110 Hypertensive heart disease with heart failure: Secondary | ICD-10-CM | POA: Diagnosis not present

## 2020-12-28 DIAGNOSIS — I5032 Chronic diastolic (congestive) heart failure: Secondary | ICD-10-CM | POA: Diagnosis not present

## 2020-12-28 DIAGNOSIS — L97821 Non-pressure chronic ulcer of other part of left lower leg limited to breakdown of skin: Secondary | ICD-10-CM | POA: Diagnosis not present

## 2020-12-28 DIAGNOSIS — I87333 Chronic venous hypertension (idiopathic) with ulcer and inflammation of bilateral lower extremity: Secondary | ICD-10-CM | POA: Diagnosis not present

## 2021-01-05 DIAGNOSIS — I87333 Chronic venous hypertension (idiopathic) with ulcer and inflammation of bilateral lower extremity: Secondary | ICD-10-CM | POA: Diagnosis not present

## 2021-01-05 DIAGNOSIS — I5032 Chronic diastolic (congestive) heart failure: Secondary | ICD-10-CM | POA: Diagnosis not present

## 2021-01-05 DIAGNOSIS — E669 Obesity, unspecified: Secondary | ICD-10-CM | POA: Diagnosis not present

## 2021-01-05 DIAGNOSIS — L97821 Non-pressure chronic ulcer of other part of left lower leg limited to breakdown of skin: Secondary | ICD-10-CM | POA: Diagnosis not present

## 2021-01-05 DIAGNOSIS — G4733 Obstructive sleep apnea (adult) (pediatric): Secondary | ICD-10-CM | POA: Diagnosis not present

## 2021-01-05 DIAGNOSIS — I110 Hypertensive heart disease with heart failure: Secondary | ICD-10-CM | POA: Diagnosis not present

## 2021-01-08 DIAGNOSIS — I110 Hypertensive heart disease with heart failure: Secondary | ICD-10-CM | POA: Diagnosis not present

## 2021-01-08 DIAGNOSIS — L97821 Non-pressure chronic ulcer of other part of left lower leg limited to breakdown of skin: Secondary | ICD-10-CM | POA: Diagnosis not present

## 2021-01-08 DIAGNOSIS — E669 Obesity, unspecified: Secondary | ICD-10-CM | POA: Diagnosis not present

## 2021-01-08 DIAGNOSIS — I87333 Chronic venous hypertension (idiopathic) with ulcer and inflammation of bilateral lower extremity: Secondary | ICD-10-CM | POA: Diagnosis not present

## 2021-01-08 DIAGNOSIS — I5032 Chronic diastolic (congestive) heart failure: Secondary | ICD-10-CM | POA: Diagnosis not present

## 2021-01-08 DIAGNOSIS — G4733 Obstructive sleep apnea (adult) (pediatric): Secondary | ICD-10-CM | POA: Diagnosis not present

## 2021-01-11 ENCOUNTER — Other Ambulatory Visit: Payer: Self-pay

## 2021-01-11 ENCOUNTER — Encounter (HOSPITAL_BASED_OUTPATIENT_CLINIC_OR_DEPARTMENT_OTHER): Payer: Medicare Other | Attending: Internal Medicine | Admitting: Internal Medicine

## 2021-01-11 DIAGNOSIS — Z8616 Personal history of COVID-19: Secondary | ICD-10-CM | POA: Diagnosis not present

## 2021-01-11 DIAGNOSIS — I87333 Chronic venous hypertension (idiopathic) with ulcer and inflammation of bilateral lower extremity: Secondary | ICD-10-CM

## 2021-01-11 DIAGNOSIS — I872 Venous insufficiency (chronic) (peripheral): Secondary | ICD-10-CM | POA: Diagnosis not present

## 2021-01-11 DIAGNOSIS — E669 Obesity, unspecified: Secondary | ICD-10-CM | POA: Diagnosis not present

## 2021-01-11 DIAGNOSIS — Z8249 Family history of ischemic heart disease and other diseases of the circulatory system: Secondary | ICD-10-CM | POA: Diagnosis not present

## 2021-01-11 DIAGNOSIS — L97811 Non-pressure chronic ulcer of other part of right lower leg limited to breakdown of skin: Secondary | ICD-10-CM | POA: Diagnosis not present

## 2021-01-11 DIAGNOSIS — I110 Hypertensive heart disease with heart failure: Secondary | ICD-10-CM | POA: Insufficient documentation

## 2021-01-11 DIAGNOSIS — L97821 Non-pressure chronic ulcer of other part of left lower leg limited to breakdown of skin: Secondary | ICD-10-CM | POA: Diagnosis not present

## 2021-01-11 DIAGNOSIS — I890 Lymphedema, not elsewhere classified: Secondary | ICD-10-CM | POA: Insufficient documentation

## 2021-01-11 DIAGNOSIS — I5032 Chronic diastolic (congestive) heart failure: Secondary | ICD-10-CM | POA: Insufficient documentation

## 2021-01-11 DIAGNOSIS — G4733 Obstructive sleep apnea (adult) (pediatric): Secondary | ICD-10-CM | POA: Diagnosis not present

## 2021-01-11 DIAGNOSIS — Z8673 Personal history of transient ischemic attack (TIA), and cerebral infarction without residual deficits: Secondary | ICD-10-CM | POA: Insufficient documentation

## 2021-01-11 DIAGNOSIS — Z833 Family history of diabetes mellitus: Secondary | ICD-10-CM | POA: Diagnosis not present

## 2021-01-11 NOTE — Progress Notes (Signed)
DARWYN PONZO ([id number]) , Visit Report for 01/11/2021 Arrival Information Details Patient Name: Date of Service: [name] [name]. 01/11/2021 12:30 PM Medical Record Number: [id number] Patient Account Number: [id number] Date of Birth/Sex: Treating RN: Jan 22, 1956 (65 y.o. [name] Primary Care Gil Ingwersen: [name] Other Clinician: Referring Emmette Katt: Treating Sofia Vanmeter/Extender: Quentin Cornwall in Treatment: 57 Visit Information History Since Last Visit Added or deleted any medications: No Patient Arrived: [name]: No Arrival Time: 12:46 Had a fall or experienced change in No Accompanied By: self activities of daily living that may affect Transfer Assistance: None risk of falls: Patient Identification Verified: Yes Signs or symptoms of abuse/neglect since last visito No Secondary Verification Process Completed: Yes Hospitalized since last visit: No Patient Requires Transmission-Based Precautions: No Implantable device outside of the clinic excluding No Patient Has Alerts: Yes cellular tissue based products placed in the center Patient Alerts: R ABI = 1.25 since last visit: L ABI = 1.27 Has Dressing in Place as Prescribed: Yes Pain Present Now: No Electronic Signature(s) Signed: 01/11/2021 2:09:33 PM By: Sandre Kitty Entered By: Sandre Kitty on 01/11/2021 12:47:19 -------------------------------------------------------------------------------- Compression Therapy Details Patient Name: Date of Service: [name] [name]. 01/11/2021 12:30 PM Medical Record Number: [id number] Patient Account Number: [id number] Date of Birth/Sex: Treating RN: [birth year] (65 y.o. [name] Primary Care Miklos Bidinger: [name] Other Clinician: Referring Kamaria Lucia: Treating Woodford Strege/Extender: Quentin Cornwall in Treatment: 66 Compression Therapy Performed for Wound Assessment: Wound #65  Left,Distal,Anterior Lower Leg Performed By: Clinician Deon Pilling, RN Compression Type: Four Layer Post Procedure Diagnosis Same as Pre-procedure Electronic Signature(s) Signed: 01/11/2021 6:28:10 PM By: [name] RN, BSN Entered By: [name] on 01/11/2021 13:26:26 -------------------------------------------------------------------------------- Compression Therapy Details Patient Name: Date of Service: [name] [name]. 01/11/2021 12:30 PM Medical Record Number: [id number] Patient Account Number: [id number] Date of Birth/Sex: Treating RN: 15-Jan-1956 (65 y.o. [name] Primary Care Laddie Math: [name] Other Clinician: Referring Chianna Spirito: Treating Savoy Som[place]/Extender: Quentin Cornwall in Treatment: 45 Compression Therapy Performed for Wound Assessment: Wound #73 Right,Lateral Lower Leg Performed By: Clinician Deon Pilling, RN Compression Type: Four Layer Post Procedure Diagnosis Same as Pre-procedure Electronic Signature(s) Signed: 01/11/2021 6:28:10 PM By: [name] RN, BSN Entered By: [name] on 01/11/2021 13:26:26 -------------------------------------------------------------------------------- Encounter Discharge Information Details Patient Name: Date of Service: [name] [name], [name] [name]. 01/11/2021 12:30 PM Medical Record Number: [id number] Patient Account Number: [id number] Date of Birth/Sex: Treating RN: Dec 04, 1955 (65 y.o. [name] Primary Care Joselinne Lawal: [name] Other Clinician: Referring Deaisa Merida: Treating Vicie Cech/Extender: Quentin Cornwall in Treatment: 55 Encounter Discharge Information Items Discharge Condition: Stable Ambulatory Status: [place] Discharge Destination: Home Transportation: Private Auto Schedule Follow-up Appointment: Yes Clinical Summary of Care: Provided on 01/11/2021 Form Type Recipient Paper Patient Patient Electronic Signature(s) Signed: 01/11/2021 2:56:34  PM By: [name] Entered By: [name] on 01/11/2021 14:56:34 -------------------------------------------------------------------------------- Lower Extremity Assessment Details Patient Name: Date of Service: [name] [name]. 01/11/2021 12:30 PM Medical Record Number: [id number] Patient Account Number: [id number] Date of Birth/Sex: Treating RN: 02-May-1956 (65 y.o. [name] Primary Care Shevon Sian: [name] Other Clinician: Referring Azana Kiesler: Treating Sharel Behne/Extender: Quentin Cornwall in Treatment: 59 Edema Assessment Assessed: [name]: Yes] [name]: Yes] Edema: [Left: Yes] [Right: Yes] Calf Left: Right: Point of Measurement: 30 cm From Medial Instep 45 cm 47 cm Ankle Left: Right: Point of Measurement: 13 cm From Medial  Instep 28 cm 28 cm Vascular Assessment Pulses: Dorsalis Pedis Palpable: [Left:Yes] [Right:Yes] Electronic Signature(s) Signed: 01/11/2021 5:25:58 PM By: [name] Entered By: [name] on 01/11/2021 13:07:01 -------------------------------------------------------------------------------- Multi Wound Chart Details Patient Name: Date of Service: [name] [name], [name] [name]. 01/11/2021 12:30 PM Medical Record Number: [id number] Patient Account Number: [id number] Date of Birth/Sex: Treating RN: Nov 15, 1955 (65 y.o. [name] Primary Care Trashaun Streight: [name] Other Clinician: Referring Kwane Rohl: Treating Lannah Koike/Extender: Quentin Cornwall in Treatment: 83 Vital Signs Height(in): 70 Pulse(bpm): 25 Weight(lbs): 360 Blood Pressure(mmHg): 159/83 Body Mass Index(BMI): 52 Temperature(F): 97.5 Respiratory Rate(breaths/min): 20 Photos: [65:No Photos Left, Distal, Anterior Lower Leg] [68:No Photos Left, Proximal, Anterior Lower Leg] [73:No Photos Right, Lateral Lower Leg] Wound Location: [65:Gradually Appeared] [68:Gradually Appeared] [73:Gradually Appeared] Wounding Event: [65:Venous Leg  Ulcer] [68:Lymphedema] [73:Lymphedema] Primary Etiology: [65:Lymphedema] [68:N/A] [73:N/A] Secondary Etiology: [65:Lymphedema, Sleep Apnea,] [68:Lymphedema, Sleep Apnea,] [73:Lymphedema, Sleep Apnea,] Comorbid History: [65:Congestive Heart Failure, Hypertension, Peripheral Venous Disease, Osteoarthritis 09/08/2019] [68:Congestive Heart Failure, Hypertension, Peripheral Venous Disease, Osteoarthritis 03/23/2020] [73:Congestive Heart Failure,  Hypertension, Peripheral Venous Disease, Osteoarthritis 08/03/2020] Date Acquired: [65:59] [68:42] [73:21] Weeks of Treatment: [65:Open] [68:Open] [73:Open] Wound Status: [65:Yes] [68:Yes] [73:Yes] Clustered Wound: [65:1] [68:10] [73:4] Clustered Quantity: [65:4x2.1x0.1] [68:2.5x2.6x0.1] [73:3x1x0.1] Measurements L x [name] x D (cm) [65:6.597] [68:5.105] [73:2.356] A (cm) : rea [65:0.66] [68:0.511] [73:0.236] Volume (cm) : [65:98.90%] [68:-181.40%] [73:97.00%] % Reduction in Area: [65:98.90%] [68:-182.30%] [73:97.00%] % Reduction in Volume: [65:Full Thickness Without Exposed] [68:Full Thickness Without Exposed] [73:Full Thickness Without Exposed] Classification: [65:Support Structures Medium] [68:Support Structures Medium] [73:Support Structures Medium] Exudate Amount: [65:Serosanguineous] [68:Serosanguineous] [73:Serosanguineous] Exudate Type: [65:red, brown] [68:red, brown] [73:red, brown] Exudate Color: [65:Distinct, outline attached] [68:Distinct, outline attached] [73:Distinct, outline attached] Wound Margin: [65:Large (67-100%)] [68:Large (67-100%)] [73:Large (67-100%)] Granulation Amount: [65:Red] [68:Red] [73:Red] Granulation Quality: [65:None Present (0%)] [68:None Present (0%)] [73:None Present (0%)] Necrotic Amount: [65:Fat Layer (Subcutaneous Tissue): Yes Fat Layer (Subcutaneous Tissue): Yes Fat Layer (Subcutaneous Tissue): Yes] Exposed Structures: [65:Fascia: No Tendon: No Muscle: No Joint: No Bone: No Medium (34-66%)] [68:Fascia: No Tendon: No  Muscle: No Joint: No Bone: No None] [73:Fascia: No Tendon: No Muscle: No Joint: No Bone: No Small (1-33%)] Epithelialization: [65:Compression Therapy] [68:N/A] [73:Compression [place] Wound Number: [id number] Photos: No Photos No Photos No Photos Left, Lateral Lower Leg Left, Proximal, Lateral Lower Leg Right, Posterior Lower Leg Wound Location: Gradually Appeared Gradually Appeared Gradually Appeared Wounding Event: Lymphedema Lymphedema Lymphedema Primary Etiology: N/A N/A N/A Secondary Etiology: Lymphedema, Sleep Apnea, Lymphedema, Sleep Apnea, Lymphedema, Sleep Apnea, Comorbid History: Congestive Heart Failure, Congestive Heart Failure, Congestive Heart Failure, Hypertension, Peripheral Venous Hypertension, Peripheral Venous Hypertension, Peripheral Venous Disease, Osteoarthritis Disease, Osteoarthritis Disease, Osteoarthritis 09/28/2020 11/16/2020 11/16/2020 Date Acquired: 15 8 8  Weeks of Treatment: Open Open Open Wound Status: No No No Clustered Wound: N/A N/A N/A Clustered Quantity: 3.5x7x0.1 1.1x4.5x0.1 0.8x0.6x0.1 Measurements L x [name] x D (cm) 19.242 3.888 0.377 A (cm) : rea 1.924 0.389 0.038 Volume (cm) : -3305.70% -8.80% 96.50% % Reduction in Area: -1602.70% -9.00% 96.50% % Reduction in Volume: Full Thickness Without Exposed Full Thickness Without Exposed Full Thickness Without Exposed Classification: Support Structures Support Structures Support Structures Medium Medium Medium Exudate Amount: Serosanguineous Serosanguineous Serosanguineous Exudate Type: red, brown red, brown red, brown Exudate Color: Distinct, outline attached Distinct, outline attached Distinct, outline attached Wound Margin: Large (67-100%) Large (67-100%) Large (67-100%) Granulation Amount: Pink, Friable Red, Friable Red Granulation Quality: Small (1-33%) Small (1-33%) None Present (0%) Necrotic Amount: Fat Layer (Subcutaneous Tissue): Yes Fat  Layer (Subcutaneous Tissue): Yes Fat Layer  (Subcutaneous Tissue): Yes Exposed Structures: Fascia: No Fascia: No Fascia: No Tendon: No Tendon: No Tendon: No Muscle: No Muscle: No Muscle: No Joint: No Joint: No Joint: No Bone: No Bone: No Bone: No Small (1-33%) Small (1-33%) Large (67-100%) Epithelialization: N/A N/A N/A Procedures Performed: Treatment Notes Electronic Signature(s) Signed: 01/11/2021 1:35:01 PM By: Kalman Shan DO Signed: 01/11/2021 6:28:10 PM By: [name] RN, BSN Entered By: Kalman Shan on 01/11/2021 13:28:00 -------------------------------------------------------------------------------- Multi-Disciplinary Care Plan Details Patient Name: Date of Service: [name] [name], [name] [name]. 01/11/2021 12:30 PM Medical Record Number: [id number] Patient Account Number: [id number] Date of Birth/Sex: Treating RN: [birth year] (65 y.o. [name] Primary Care Jolyne Laye: [name] Other Clinician: Referring Damyah Gugel: Treating Jeda Pardue/Extender: Quentin Cornwall in Treatment: 75 Active Inactive Venous Leg Ulcer Nursing Diagnoses: Knowledge deficit related to disease process and management Potential for venous Insuffiency (use before diagnosis confirmed) Goals: Patient will maintain optimal edema control Date Initiated: 01/11/2021 Target Resolution Date: 02/08/2021 Goal Status: Active Interventions: Assess peripheral edema status every visit. Compression as ordered Treatment Activities: Therapeutic compression applied : 01/11/2021 Notes: Wound/Skin Impairment Nursing Diagnoses: Knowledge deficit related to ulceration/compromised skin integrity Goals: Patient/caregiver will verbalize understanding of skin care regimen Date Initiated: 11/25/2019 Target Resolution Date: 02/08/2021 Goal Status: Active Ulcer/skin breakdown will have a volume reduction of 30% by week 4 Date Initiated: 11/25/2019 Date Inactivated: 01/06/2020 Target Resolution Date: 12/26/2019 Goal Status:  Met Ulcer/skin breakdown will have a volume reduction of 50% by week 8 Date Initiated: 01/06/2020 Date Inactivated: 02/17/2020 Target Resolution Date: 02/05/2020 Goal Status: Met Interventions: Assess patient/caregiver ability to obtain necessary supplies Assess patient/caregiver ability to perform ulcer/skin care regimen upon admission and as needed Assess ulceration(s) every visit Notes: Electronic Signature(s) Signed: 01/11/2021 6:28:10 PM By: [name] RN, BSN Entered By: [name] on 01/11/2021 13:25:06 -------------------------------------------------------------------------------- Pain Assessment Details Patient Name: Date of Service: [name] [name]. 01/11/2021 12:30 PM Medical Record Number: [id number] Patient Account Number: [id number] Date of Birth/Sex: Treating RN: August 30, 1956 (65 y.o. [name] Primary Care Eusevio Schriver: [name] Other Clinician: Referring Adeyemi Hamad: Treating Nona Gracey/Extender: Quentin Cornwall in Treatment: 26 Active Problems Location of Pain Severity and Description of Pain Patient Has Paino No Site Locations Pain Management and Medication Current Pain Management: Electronic Signature(s) Signed: 01/11/2021 2:09:33 PM By: Sandre Kitty Signed: 01/11/2021 6:28:10 PM By: [name] RN, BSN Entered By: Sandre Kitty on 01/11/2021 12:49:50 -------------------------------------------------------------------------------- Patient/Caregiver Education Details Patient Name: Date of Service: [name] 4/26/2022andnbsp12:30 PM Medical Record Number: [id number] Patient Account Number: [id number] Date of Birth/Gender: Treating RN: [birth year] (65 y.o. [name] Primary Care Physician: [name] Other Clinician: Referring Physician: Treating Physician/Extender: Quentin Cornwall in Treatment: 23 Education Assessment Education Provided To: Patient Education Topics  Provided Venous: Methods: Explain/Verbal Responses: Reinforcements needed, State content correctly Wound/Skin Impairment: Methods: Explain/Verbal Responses: Reinforcements needed, State content correctly Electronic Signature(s) Signed: 01/11/2021 6:28:10 PM By: [name] RN, BSN Entered By: [name] on 01/11/2021 13:25:36 -------------------------------------------------------------------------------- Wound Assessment Details Patient Name: Date of Service: [name] [name]. 01/11/2021 12:30 PM Medical Record Number: [id number] Patient Account Number: [id number] Date of Birth/Sex: Treating RN: 07-17-1956 (66 y.o. [name] Primary Care Jaylan Duggar: [name] Other Clinician: Referring Cadarius Nevares: Treating Roosevelt Eimers/Extender: Quentin Cornwall in Treatment: 78 Wound Status Wound Number: 65 Primary Venous Leg Ulcer Etiology: Wound Location: Left, Distal, Anterior Lower Leg Secondary Lymphedema Wounding  Event: Gradually Appeared Etiology: Date Acquired: 09/08/2019 Wound Open Weeks Of Treatment: 59 Status: Clustered Wound: Yes Comorbid Lymphedema, Sleep Apnea, Congestive Heart Failure, History: Hypertension, Peripheral Venous Disease, Osteoarthritis Photos Wound Measurements Length: (cm) 4 Width: (cm) 2. Depth: (cm) 0. Clustered Quantity: 1 Area: (cm) 6 Volume: (cm) 0 % Reduction in Area: 98.9% 1 % Reduction in Volume: 98.9% 1 Epithelialization: Medium (34-66%) Tunneling: No .597 Undermining: No .66 Wound Description Classification: Full Thickness Without Exposed Support Structures Wound Margin: Distinct, outline attached Exudate Amount: Medium Exudate Type: Serosanguineous Exudate Color: red, brown Foul Odor After Cleansing: No Slough/Fibrino No Wound Bed Granulation Amount: Large (67-100%) Exposed Structure Granulation Quality: Red Fascia Exposed: No Necrotic Amount: None Present (0%) Fat Layer (Subcutaneous Tissue)  Exposed: Yes Tendon Exposed: No Muscle Exposed: No Joint Exposed: No Bone Exposed: No Treatment Notes Wound #65 (Lower Leg) Wound Laterality: Left, Anterior, Distal Cleanser Normal Saline Discharge Instruction: Cleanse the wound with Normal Saline prior to applying a clean dressing using gauze sponges, not tissue or cotton balls. Peri-Wound Care Sween Lotion (Moisturizing lotion) Discharge Instruction: Apply moisturizing lotion as directed Topical Primary Dressing KerraCel Ag Gelling Fiber Dressing, 4x5 in (silver alginate) Discharge Instruction: Apply silver alginate to wound bed as instructed Secondary Dressing Woven Gauze Sponge, Non-Sterile 4x4 in Discharge Instruction: Apply over primary dressing as directed. ABD Pad, 5x9 Discharge Instruction: Apply over primary dressing as directed. Secured With Compression Wrap FourPress (4 layer compression wrap) Discharge Instruction: Apply four layer compression as directed. Compression Stockings Add-Ons Electronic Signature(s) Signed: 01/11/2021 5:14:41 PM By: Sandre Kitty Signed: 01/11/2021 5:25:58 PM By: [name] Entered By: Sandre Kitty on 01/11/2021 17:04:25 -------------------------------------------------------------------------------- Wound Assessment Details Patient Name: Date of Service: [name] [name], [name] [name]. 01/11/2021 12:30 PM Medical Record Number: [id number] Patient Account Number: [id number] Date of Birth/Sex: Treating RN: Aug 23, 1956 (65 y.o. [name] Primary Care Lowell Mcgurk: [name] Other Clinician: Referring Fenton Candee: Treating Shereka Lafortune/Extender: Quentin Cornwall in Treatment: 59 Wound Status Wound Number: 68 Primary Lymphedema Etiology: Wound Location: Left, Proximal, Anterior Lower Leg Wound Open Wounding Event: Gradually Appeared Status: Date Acquired: 03/23/2020 Comorbid Lymphedema, Sleep Apnea, Congestive Heart Failure, Weeks Of Treatment: 42 History:  Hypertension, Peripheral Venous Disease, Osteoarthritis Clustered Wound: Yes Photos Wound Measurements Length: (cm) Width: (cm) Depth: (cm) Clustered Quantity: Area: (cm) Volume: (cm) 2.5 % Reduction in Area: -181.4% 2.6 % Reduction in Volume: -182.3% 0.1 Epithelialization: None 10 Tunneling: No 5.105 Undermining: No 0.511 Wound Description Classification: Full Thickness Without Exposed Support Structures Wound Margin: Distinct, outline attached Exudate Amount: Medium Exudate Type: Serosanguineous Exudate Color: red, brown Wound Bed Granulation Amount: Large (67-100%) Granulation Quality: Red Necrotic Amount: None Present (0%) Foul Odor After Cleansing: No Slough/Fibrino No Exposed Structure Fascia Exposed: No Fat Layer (Subcutaneous Tissue) Exposed: Yes Tendon Exposed: No Muscle Exposed: No Joint Exposed: No Bone Exposed: No Treatment Notes Wound #68 (Lower Leg) Wound Laterality: Left, Anterior, Proximal Cleanser Soap and Water Discharge Instruction: May shower and wash wound with dial antibacterial soap and water prior to dressing change. Peri-Wound Care Sween Lotion (Moisturizing lotion) Discharge Instruction: Apply moisturizing lotion as directed Topical Primary Dressing KerraCel Ag Gelling Fiber Dressing, 4x5 in (silver alginate) Discharge Instruction: Apply silver alginate to wound bed as instructed Secondary Dressing Woven Gauze Sponge, Non-Sterile 4x4 in Discharge Instruction: Apply over primary dressing as directed. ABD Pad, 5x9 Discharge Instruction: Apply over primary dressing as directed. Secured With Compression Wrap FourPress (4 layer compression wrap) Discharge Instruction: Apply four layer compression as directed. Compression  Stockings Environmental education officer) Signed: 01/11/2021 5:14:41 PM By: Sandre Kitty Signed: 01/11/2021 5:25:58 PM By: [name] Entered By: Sandre Kitty on 01/11/2021  17:04:56 -------------------------------------------------------------------------------- Wound Assessment Details Patient Name: Date of Service: [name] [name], [name] [name]. 01/11/2021 12:30 PM Medical Record Number: [id number] Patient Account Number: [id number] Date of Birth/Sex: Treating RN: 18-Apr-1956 (65 y.o. [name] Primary Care Cheron Coryell: [name] Other Clinician: Referring Rayel Santizo: Treating Janiel Derhammer/Extender: Quentin Cornwall in Treatment: 59 Wound Status Wound Number: 73 Primary Lymphedema Etiology: Wound Location: Right, Lateral Lower Leg Wound Open Wounding Event: Gradually Appeared Status: Date Acquired: 08/03/2020 Comorbid Lymphedema, Sleep Apnea, Congestive Heart Failure, Weeks Of Treatment: 21 History: Hypertension, Peripheral Venous Disease, Osteoarthritis Clustered Wound: Yes Photos Wound Measurements Length: (cm) 3 Width: (cm) 1 Depth: (cm) 0.1 Clustered Quantity: 4 Area: (cm) 2.356 Volume: (cm) 0.236 % Reduction in Area: 97% % Reduction in Volume: 97% Epithelialization: Small (1-33%) Tunneling: No Undermining: No Wound Description Classification: Full Thickness Without Exposed Support Structures Wound Margin: Distinct, outline attached Exudate Amount: Medium Exudate Type: Serosanguineous Exudate Color: red, brown Foul Odor After Cleansing: No Slough/Fibrino No Wound Bed Granulation Amount: Large (67-100%) Exposed Structure Granulation Quality: Red Fascia Exposed: No Necrotic Amount: None Present (0%) Fat Layer (Subcutaneous Tissue) Exposed: Yes Tendon Exposed: No Muscle Exposed: No Joint Exposed: No Bone Exposed: No Treatment Notes Wound #73 (Lower Leg) Wound Laterality: Right, Lateral Cleanser Soap and Water Discharge Instruction: May shower and wash wound with dial antibacterial soap and water prior to dressing change. Peri-Wound Care Sween Lotion (Moisturizing lotion) Discharge Instruction: Apply  moisturizing lotion as directed Topical Primary Dressing KerraCel Ag Gelling Fiber Dressing, 4x5 in (silver alginate) Discharge Instruction: Apply silver alginate to wound bed as instructed Secondary Dressing Woven Gauze Sponge, Non-Sterile 4x4 in Discharge Instruction: Apply over primary dressing as directed. ABD Pad, 5x9 Discharge Instruction: Apply over primary dressing as directed. Secured With Compression Wrap FourPress (4 layer compression wrap) Discharge Instruction: Apply four layer compression as directed. Compression Stockings Add-Ons Electronic Signature(s) Signed: 01/11/2021 5:14:41 PM By: Sandre Kitty Signed: 01/11/2021 5:25:58 PM By: [name] Entered By: Sandre Kitty on 01/11/2021 17:02:46 -------------------------------------------------------------------------------- Wound Assessment Details Patient Name: Date of Service: [name] [name], [name] [name]. 01/11/2021 12:30 PM Medical Record Number: [id number] Patient Account Number: [id number] Date of Birth/Sex: Treating RN: 1955-11-29 (65 y.o. [name] Primary Care Nezar Buckles: [name] Other Clinician: Referring Dexter Sauser: Treating Paelyn Smick/Extender: Quentin Cornwall in Treatment: 59 Wound Status Wound Number: 74 Primary Lymphedema Etiology: Wound Location: Left, Lateral Lower Leg Wound Open Wounding Event: Gradually Appeared Status: Date Acquired: 09/28/2020 Comorbid Lymphedema, Sleep Apnea, Congestive Heart Failure, Weeks Of Treatment: 15 History: Hypertension, Peripheral Venous Disease, Osteoarthritis Clustered Wound: No Photos Wound Measurements Length: (cm) 3.5 Width: (cm) 7 Depth: (cm) 0.1 Area: (cm) 19.242 Volume: (cm) 1.924 % Reduction in Area: -3305.7% % Reduction in Volume: -1602.7% Epithelialization: Small (1-33%) Tunneling: No Undermining: No Wound Description Classification: Full Thickness Without Exposed Support Structures Wound Margin: Distinct,  outline attached Exudate Amount: Medium Exudate Type: Serosanguineous Exudate Color: red, brown Foul Odor After Cleansing: No Slough/Fibrino Yes Wound Bed Granulation Amount: Large (67-100%) Exposed Structure Granulation Quality: Pink, Friable Fascia Exposed: No Necrotic Amount: Small (1-33%) Fat Layer (Subcutaneous Tissue) Exposed: Yes Necrotic Quality: Adherent Slough Tendon Exposed: No Muscle Exposed: No Joint Exposed: No Bone Exposed: No Treatment Notes Wound #74 (Lower Leg) Wound Laterality: Left, Lateral Cleanser Soap and Water Discharge Instruction: May shower and wash wound with dial antibacterial soap  and water prior to dressing change. Peri-Wound Care Sween Lotion (Moisturizing lotion) Discharge Instruction: Apply moisturizing lotion as directed Topical Primary Dressing KerraCel Ag Gelling Fiber Dressing, 4x5 in (silver alginate) Discharge Instruction: Apply silver alginate to wound bed as instructed Secondary Dressing Woven Gauze Sponge, Non-Sterile 4x4 in Discharge Instruction: Apply over primary dressing as directed. ABD Pad, 5x9 Discharge Instruction: Apply over primary dressing as directed. Secured With Compression Wrap FourPress (4 layer compression wrap) Discharge Instruction: Apply four layer compression as directed. Compression Stockings Add-Ons Electronic Signature(s) Signed: 01/11/2021 5:14:41 PM By: Sandre Kitty Signed: 01/11/2021 5:25:58 PM By: [name] Entered By: Sandre Kitty on 01/11/2021 17:06:28 -------------------------------------------------------------------------------- Wound Assessment Details Patient Name: Date of Service: [name] [name], [name] [name]. 01/11/2021 12:30 PM Medical Record Number: [id number] Patient Account Number: [id number] Date of Birth/Sex: Treating RN: Jun 20, 1956 (65 y.o. [name] Primary Care Damani Rando: [name] Other Clinician: Referring Luria Rosario: Treating Diala Waxman/Extender: Quentin Cornwall in Treatment: 59 Wound Status Wound Number: [id number] Primary Lymphedema Etiology: Wound Location: Left, Proximal, Lateral Lower Leg Wound Open Wounding Event: Gradually Appeared Status: Date Acquired: 11/16/2020 Comorbid Lymphedema, Sleep Apnea, Congestive Heart Failure, Weeks Of Treatment: 8 History: Hypertension, Peripheral Venous Disease, Osteoarthritis Clustered Wound: No Photos Wound Measurements Length: (cm) 1.1 Width: (cm) 4.5 Depth: (cm) 0.1 Area: (cm) 3.888 Volume: (cm) 0.389 Wound Description Classification: Full Thickness Without Exposed Support Structu Wound Margin: Distinct, outline attached Exudate Amount: Medium Exudate Type: Serosanguineous Exudate Color: red, brown res Foul Odor After Cleansing: No Slough/Fibrino Yes % Reduction in Area: -8.8% % Reduction in Volume: -9% Epithelialization: Small (1-33%) Tunneling: No Undermining: No Wound Bed Granulation Amount: Large (67-100%) Exposed Structure Granulation Quality: Red, Friable Fascia Exposed: No Necrotic Amount: Small (1-33%) Fat Layer (Subcutaneous Tissue) Exposed: Yes Necrotic Quality: Adherent Slough Tendon Exposed: No Muscle Exposed: No Joint Exposed: No Bone Exposed: No Treatment Notes Wound #77 (Lower Leg) Wound Laterality: Left, Lateral, Proximal Cleanser Soap and Water Discharge Instruction: May shower and wash wound with dial antibacterial soap and water prior to dressing change. Peri-Wound Care Sween Lotion (Moisturizing lotion) Discharge Instruction: Apply moisturizing lotion as directed Topical Primary Dressing KerraCel Ag Gelling Fiber Dressing, 4x5 in (silver alginate) Discharge Instruction: Apply silver alginate to wound bed as instructed Secondary Dressing Woven Gauze Sponge, Non-Sterile 4x4 in Discharge Instruction: Apply over primary dressing as directed. ABD Pad, 5x9 Discharge Instruction: Apply over primary dressing as directed. Secured  With Compression Wrap FourPress (4 layer compression wrap) Discharge Instruction: Apply four layer compression as directed. Compression Stockings Add-Ons Electronic Signature(s) Signed: 01/11/2021 5:14:41 PM By: Sandre Kitty Signed: 01/11/2021 5:25:58 PM By: [name] Entered By: Sandre Kitty on 01/11/2021 17:05:25 -------------------------------------------------------------------------------- Wound Assessment Details Patient Name: Date of Service: [name] [name], [name] [name]. 01/11/2021 12:30 PM Medical Record Number: [id number] Patient Account Number: [id number] Date of Birth/Sex: Treating RN: 02-06-1956 (65 y.o. [name] Primary Care Lizza Huffaker: [name] Other Clinician: Referring Bonnita Newby: Treating Melven Stockard/Extender: Quentin Cornwall in Treatment: 59 Wound Status Wound Number: 78 Primary Lymphedema Etiology: Etiology: Wound Location: Right, Posterior Lower Leg Wound Open Wounding Event: Gradually Appeared Status: Date Acquired: 11/16/2020 Comorbid Lymphedema, Sleep Apnea, Congestive Heart Failure, Weeks Of Treatment: 8 History: Hypertension, Peripheral Venous Disease, Osteoarthritis Clustered Wound: No Photos Wound Measurements Length: (cm) 0.8 Width: (cm) 0.6 Depth: (cm) 0.1 Area: (cm) 0.377 Volume: (cm) 0.038 % Reduction in Area: 96.5% % Reduction in Volume: 96.5% Epithelialization: Large (67-100%) Tunneling: No Undermining: No Wound Description Classification: Full Thickness Without Exposed  Support Structures Wound Margin: Distinct, outline attached Exudate Amount: Medium Exudate Type: Serosanguineous Exudate Color: red, brown Foul Odor After Cleansing: No Slough/Fibrino No Wound Bed Granulation Amount: Large (67-100%) Exposed Structure Granulation Quality: Red Fascia Exposed: No Necrotic Amount: None Present (0%) Fat Layer (Subcutaneous Tissue) Exposed: Yes Tendon Exposed: No Muscle Exposed: No Joint Exposed:  No Bone Exposed: No Treatment Notes Wound #78 (Lower Leg) Wound Laterality: Right, Posterior Cleanser Soap and Water Discharge Instruction: May shower and wash wound with dial antibacterial soap and water prior to dressing change. Peri-Wound Care Sween Lotion (Moisturizing lotion) Discharge Instruction: Apply moisturizing lotion as directed Topical Primary Dressing KerraCel Ag Gelling Fiber Dressing, 4x5 in (silver alginate) Discharge Instruction: Apply silver alginate to wound bed as instructed Secondary Dressing Woven Gauze Sponge, Non-Sterile 4x4 in Discharge Instruction: Apply over primary dressing as directed. ABD Pad, 5x9 Discharge Instruction: Apply over primary dressing as directed. Secured With Compression Wrap FourPress (4 layer compression wrap) Discharge Instruction: Apply four layer compression as directed. Compression Stockings Add-Ons Electronic Signature(s) Signed: 01/11/2021 5:14:41 PM By: Sandre Kitty Signed: 01/11/2021 5:25:58 PM By: [name] Entered By: Sandre Kitty on 01/11/2021 17:03:55 -------------------------------------------------------------------------------- Vitals Details Patient Name: Date of Service: [name] [name], [name] [name]. 01/11/2021 12:30 PM Medical Record Number: [id number] Patient Account Number: [id number] Date of Birth/Sex: Treating RN: [birth year] (65 y.o. [name] Primary Care Marshea Wisher: [name] Other Clinician: Referring Denisha Hoel: Treating Mele Sylvester/Extender: Quentin Cornwall in Treatment: 62 Vital Signs Time Taken: 12:49 Temperature (F): 97.5 Height (in): 70 Pulse (bpm): 65 Weight (lbs): 360 Respiratory Rate (breaths/min): 20 Body Mass Index (BMI): 51.6 Blood Pressure (mmHg): 159/83 Reference Range: 80 - 120 mg / dl Electronic Signature(s) Signed: 01/11/2021 2:09:33 PM By: Sandre Kitty Entered By: Sandre Kitty on 01/11/2021 12:49:42

## 2021-01-11 NOTE — Progress Notes (Signed)
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 back at [facility] he says he has not been using the external compression pumps. Silver alginate to the wounds 5/4; he has good edema control bilaterally. He has not been using the compression pumps he tells [place] his wife is coming home from [place] today. He does not have any open wounds on the right leg continued large collection of small wounds on the left anterior leg extending medially into laterally nothing much posteriorly on the left. 6/1. He has a smattering of small wounds anteriorly on the left  and a larger but superficial wound on the left posterior calf. There is nothing open on the right we have been using silver alginate on the left I will change that the [facility] today 6/22; there is nothing open on his right leg. He has a smattering of small eschars on the left anterior lower leg but no open wounds per se. Somebody applied the compression wrap on the right leg in a very irregular fashion. He has a very tender area on the right medial ankle. 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 Not surprisingly comes back in with wounds on his bilateral legs at the level of where his external compression garment was  tight superiorly. He tells me he is using his pumps once every second day. 7/20; 2-week follow-up the patient has not been using his compression pumps his wife was transferred to a new nursing home. He has 1 wound on the medial left lower leg and a small area on the right lateral 8/17; patient arrives today with only a smattering of small wounds on the left anterior lower leg most of these are eschared and look benign. There is nothing on his right leg. We have been using silver alginate under 4-layer compression 8/31; patient's wounds on the left anterior lower leg are larger. There is still nothing open on the right toe although we did put compression wraps on this last time. He has home health. Says he is used his compression pumps twice in the last 3 days. Obviously not sufficient 9/14; 2-week follow-up. We discharged him in his own zippered stocking. Apparently home health helped him change this and noted weeping edema fluid therefore put him back in a wrap he arrives in the clinic today with no open wounds on the right innumerable small broken areas on the anterior left calf. Uncontrolled edema above the level of the wrap compatible with lymphedema 9/27 2-week follow-up. He arrives today with the left anterior lower extremity looking better. On the right he has a small open area posteriorly. I am going to put him back in compression on both sides. He claims compliance with his compression pumps at home twice a day he has home health changing his dressing 10/26; 1 month follow-up he has home health changing his dressings there is no open wound on the right leg he has extremitease stockings and external compression pumps which he says he is using once a day. The left leg is always been a more difficult site and although it is difficult to identify any precise open wound he has several leaking areas especially anteriorly and superiorly and at the edge of his wraps 11/9; 2-week follow-up. He has  his extremities stocking on the right. He says he has been using his compression pumps once a day. He has 1 remaining wound on the left anterior mid tibia which looks healthy his edema control is otherwise good on the left 11/30; there is now a 3-week follow-up. We use his own compression stocking on the right last time as he has no open wounds. He apparently developed a UTI received a course of ciprofloxacin. He did not wear his compression pumps and I wonder whether he actually was using his stocking. Home health put a compression wrap on his right leg last week. He has multiple small open areas on the left anterior leg this week. 12/7; still a cluster of small areas on the left lateral calf  and the right lateral calf. He has home health changing his dressings. For some reason he will not use compression pumps reliably this week because his wife spent 2 days in the hospital with anemia 12/21; he continues to have a cluster of small areas predominantly on the left lateral but also the left anterior lower leg. More defined wounds on the right anterior and right medial. The he says he is using the compression pumps every other day. I have never understood the issue for this noncompliant. He still has home health coming out 1/11 the patient continues to have multiple small open areas on the left lateral left anterior and medial lower leg anteriorly. He also has a small punched-out painful area in the crease of his left ankle which I think is probably a rapid injury. Very tender. I debrided in this area very adherent debris. Also small areas on the right lateral calf. We have been using silver alginate. His compression pump usage is probably marginal 2/8; the patient comes back in with his legs essentially in the same condition. He has multiple small areas on his bilateral lower extremities left greater than right anteriorly and posteriorly. A lot of these have small eschars on top some of them do not.  His edema control is marginal. He says he uses his pumps once a day. He has home health changing his dressing 4-layer compression silver alginate as the primary dressing 3/1; Marked deterioration this visit. He has multiple small areas across the entirety of the lateral anterior and medial left lower leg. I am actually concerned that he may be on his way to a more substantive degree of breakdown in this area. He is using his compression pumps 4 times per week. He has home health changing this dressing week 3/29; again not much improvement from last time he is here significant bilateral small wounds for the most part skin breakdown. The larger areas are on the left lateral. Small areas bilaterally. He has poor edema control. Relates that he is not using his compression pumps with any reliability. He states his wife was in the hospital last week. He does not complain of chest pain or shortness of breath 4/26; patient presents for 1 month follow-up. He reports no issues. He states that he last used his lymphedema pumps several weeks ago. He has been having his legs wrapped bilaterally with home health with 4-layer compression and silver alginate underneath. He has no complaints today. Electronic Signature(s) Signed: 01/11/2021 1:35:01 PM By: Kalman Shan DO Entered By: Kalman Shan on 01/11/2021 13:29:46 -------------------------------------------------------------------------------- Physical Exam Details Patient Name: Date of Service: [name] [name]. 01/11/2021 12:30 PM Medical Record Number: [id number] Patient Account Number: [id number] Date of Birth/Sex: Treating RN: 1956/09/16 (65 y.o. [name] [name] Primary Care Provider: [name] Other Clinician: Referring Provider: Treating Provider/Extender: Quentin Cornwall in Treatment: 23 Constitutional respirations regular, non-labored and within target range for patient.Marland Kitchen Psychiatric pleasant and  cooperative. Notes Bilateral lower extremities: Numerous scattered open wounds limited to skin breakdown. Swelling is greater in the left versus right leg. There is 3+ pitting edema to the knees. No signs of infection. Chronic skin changes suggesting venous stasis and lymphedema. Electronic Signature(s) Signed: 01/11/2021 1:35:01 PM By: Kalman Shan DO Entered By: Kalman Shan on 01/11/2021 13:31:20 -------------------------------------------------------------------------------- Physician Orders Details Patient Name: Date of Service: [name] [name], [name] [name]. 01/11/2021 12:30 PM Medical Record Number: [id number] Patient Account Number: [id number] Date of Birth/Sex: Treating RN: 12-29-1955 (65 y.o.  [name] [name] Primary Care Provider: [name] Other Clinician: Referring Provider: Treating Provider/Extender: Quentin Cornwall in Treatment: 96 Verbal / Phone Orders: No Diagnosis Coding ICD-10 Coding Code Description [phone] Chronic venous hypertension (idiopathic) with ulcer and inflammation of bilateral lower extremity I89.0 Lymphedema, not elsewhere classified L97.821 Non-pressure chronic ulcer of other part of left lower leg limited to breakdown of skin L97.811 Non-pressure chronic ulcer of other part of right lower leg limited to breakdown of skin Follow-up Appointments Return appointment in 1 month. Bathing/ Shower/ Hygiene May shower with protection but do not get wound dressing(s) wet. Edema Control - Lymphedema / SCD / Other Lymphedema Pumps. Use Lymphedema pumps on leg(s) 2-3 times a day for 45-60 minutes. If wearing any wraps or hose, do not remove them. Continue exercising as instructed. Elevate legs to the level of the heart or above for 30 minutes daily and/or when sitting, a frequency of: Avoid standing for long periods of time. Exercise regularly Home Health No change in wound care orders this week; continue Home Health for wound care. May  utilize formulary equivalent dressing for wound treatment orders unless otherwise specified. - home health to change 2x a week {if possible Tuesday's and Friday's} Other Home Health Orders/Instructions: - [facility]ound [facility] Wound #65 - Lower Leg Wound Laterality: Left, Anterior, Distal Cleanser: Normal Saline (Home Health) (Generic) 2 x Per Week/30 Days Discharge Instructions: Cleanse the wound with Normal Saline prior to applying a clean dressing using gauze sponges, not tissue or cotton balls. Peri-Wound Care: Sween Lotion (Moisturizing lotion) (Home Health) (Generic) 2 x Per Week/30 Days Discharge Instructions: Apply moisturizing lotion as directed Prim Dressing: KerraCel Ag Gelling Fiber Dressing, 4x5 in (silver alginate) (Home Health) (Generic) 2 x Per Week/30 Days ary Discharge Instructions: Apply silver alginate to wound bed as instructed Secondary Dressing: Woven Gauze Sponge, Non-Sterile 4x4 in (Home Health) (Generic) 2 x Per Week/30 Days Discharge Instructions: Apply over primary dressing as directed. Secondary Dressing: ABD Pad, 5x9 (Home Health) 2 x Per Week/30 Days Discharge Instructions: Apply over primary dressing as directed. Compression Wrap: FourPress (4 layer compression wrap) (Home Health) (Generic) 2 x Per Week/30 Days Discharge Instructions: Apply four layer compression as directed. Wound #68 - Lower Leg Wound Laterality: Left, Anterior, Proximal Cleanser: Soap and Water 2 x Per Week/30 Days Discharge Instructions: May shower and wash wound with dial antibacterial soap and water prior to dressing change. Peri-Wound Care: Sween Lotion (Moisturizing lotion) (Home Health) (Generic) 2 x Per Week/30 Days Discharge Instructions: Apply moisturizing lotion as directed Prim Dressing: KerraCel Ag Gelling Fiber Dressing, 4x5 in (silver alginate) (Home Health) (Generic) 2 x Per Week/30 Days ary Discharge Instructions: Apply silver alginate to wound bed as  instructed Secondary Dressing: Woven Gauze Sponge, Non-Sterile 4x4 in (Home Health) (Generic) 2 x Per Week/30 Days Discharge Instructions: Apply over primary dressing as directed. Secondary Dressing: ABD Pad, 5x9 (Home Health) 2 x Per Week/30 Days Discharge Instructions: Apply over primary dressing as directed. Compression Wrap: FourPress (4 layer compression wrap) (Home Health) (Generic) 2 x Per Week/30 Days Discharge Instructions: Apply four layer compression as directed. Wound #73 - Lower Leg Wound Laterality: Right, Lateral Cleanser: Soap and Water 2 x Per Week/30 Days Discharge Instructions: May shower and wash wound with dial antibacterial soap and water prior to dressing change. Peri-Wound Care: Sween Lotion (Moisturizing lotion) (Home Health) (Generic) 2 x Per Week/30 Days Discharge Instructions: Apply moisturizing lotion as directed Prim Dressing: KerraCel Ag Gelling Fiber Dressing, 4x5 in (  silver alginate) (Home Health) (Generic) 2 x Per Week/30 Days ary Discharge Instructions: Apply silver alginate to wound bed as instructed Secondary Dressing: Woven Gauze Sponge, Non-Sterile 4x4 in (Home Health) (Generic) 2 x Per Week/30 Days Discharge Instructions: Apply over primary dressing as directed. Secondary Dressing: ABD Pad, 5x9 (Home Health) 2 x Per Week/30 Days Discharge Instructions: Apply over primary dressing as directed. Compression Wrap: FourPress (4 layer compression wrap) (Home Health) (Generic) 2 x Per Week/30 Days Discharge Instructions: Apply four layer compression as directed. Wound #74 - Lower Leg Wound Laterality: Left, Lateral Cleanser: Soap and Water 2 x Per Week/30 Days Discharge Instructions: May shower and wash wound with dial antibacterial soap and water prior to dressing change. Peri-Wound Care: Sween Lotion (Moisturizing lotion) (Home Health) (Generic) 2 x Per Week/30 Days Discharge Instructions: Apply moisturizing lotion as directed Prim Dressing: KerraCel Ag  Gelling Fiber Dressing, 4x5 in (silver alginate) (Home Health) (Generic) 2 x Per Week/30 Days ary Discharge Instructions: Apply silver alginate to wound bed as instructed Secondary Dressing: Woven Gauze Sponge, Non-Sterile 4x4 in (Home Health) (Generic) 2 x Per Week/30 Days Discharge Instructions: Apply over primary dressing as directed. Secondary Dressing: ABD Pad, 5x9 (Home Health) 2 x Per Week/30 Days Discharge Instructions: Apply over primary dressing as directed. Compression Wrap: FourPress (4 layer compression wrap) (Home Health) (Generic) 2 x Per Week/30 Days Discharge Instructions: Apply four layer compression as directed. Wound #77 - Lower Leg Wound Laterality: Left, Lateral, Proximal Cleanser: Soap and Water 2 x Per Week/30 Days Discharge Instructions: May shower and wash wound with dial antibacterial soap and water prior to dressing change. Peri-Wound Care: Sween Lotion (Moisturizing lotion) (Home Health) (Generic) 2 x Per Week/30 Days Discharge Instructions: Apply moisturizing lotion as directed Prim Dressing: KerraCel Ag Gelling Fiber Dressing, 4x5 in (silver alginate) (Home Health) (Generic) 2 x Per Week/30 Days ary Discharge Instructions: Apply silver alginate to wound bed as instructed Secondary Dressing: Woven Gauze Sponge, Non-Sterile 4x4 in (Home Health) (Generic) 2 x Per Week/30 Days Discharge Instructions: Apply over primary dressing as directed. Secondary Dressing: ABD Pad, 5x9 (Home Health) 2 x Per Week/30 Days Discharge Instructions: Apply over primary dressing as directed. Compression Wrap: FourPress (4 layer compression wrap) (Home Health) (Generic) 2 x Per Week/30 Days Discharge Instructions: Apply four layer compression as directed. Wound #78 - Lower Leg Wound Laterality: Right, Posterior Cleanser: Soap and Water 2 x Per Week/30 Days Discharge Instructions: May shower and wash wound with dial antibacterial soap and water prior to dressing change. Peri-Wound Care:  Sween Lotion (Moisturizing lotion) (Home Health) (Generic) 2 x Per Week/30 Days Discharge Instructions: Apply moisturizing lotion as directed Prim Dressing: KerraCel Ag Gelling Fiber Dressing, 4x5 in (silver alginate) (Home Health) (Generic) 2 x Per Week/30 Days ary Discharge Instructions: Apply silver alginate to wound bed as instructed Secondary Dressing: Woven Gauze Sponge, Non-Sterile 4x4 in (Home Health) (Generic) 2 x Per Week/30 Days Discharge Instructions: Apply over primary dressing as directed. Secondary Dressing: ABD Pad, 5x9 (Home Health) 2 x Per Week/30 Days Discharge Instructions: Apply over primary dressing as directed. Compression Wrap: FourPress (4 layer compression wrap) (Home Health) (Generic) 2 x Per Week/30 Days Discharge Instructions: Apply four layer compression as directed. Electronic Signature(s) Signed: 01/11/2021 1:35:01 PM By: Kalman Shan DO Entered By: Kalman Shan on 01/11/2021 13:31:52 -------------------------------------------------------------------------------- Problem List Details Patient Name: Date of Service: [name] [name], [name] [name]. 01/11/2021 12:30 PM Medical Record Number: [id number] Patient Account Number: [id number] Date of Birth/Sex: Treating RN: [birth year] (65 y.o.  Ulyses Amor, Vaughan Basta Primary Care Provider: [name] Other Clinician: Referring Provider: Treating Provider/Extender: Quentin Cornwall in Treatment: 79 Active Problems ICD-10 Encounter Code Description Active Date MDM Diagnosis I87.333 Chronic venous hypertension (idiopathic) with ulcer and inflammation of 11/25/2019 No Yes bilateral lower extremity I89.0 Lymphedema, not elsewhere classified 11/25/2019 No Yes L97.821 Non-pressure chronic ulcer of other part of left lower leg limited to breakdown 11/25/2019 No Yes of skin L97.811 Non-pressure chronic ulcer of other part of right lower leg limited to breakdown 11/25/2019 No Yes of skin Inactive  Problems ICD-10 Code Description Active Date Inactive Date L03.115 Cellulitis of right lower limb 03/09/2020 03/09/2020 Resolved Problems Electronic Signature(s) Signed: 01/11/2021 1:35:01 PM By: Kalman Shan DO Entered By: Kalman Shan on 01/11/2021 13:27:52 -------------------------------------------------------------------------------- Progress Note Details Patient Name: Date of Service: [name] [name], [name] [name]. 01/11/2021 12:30 PM Medical Record Number: [id number] Patient Account Number: [id number] Date of Birth/Sex: Treating RN: [birth year] (65 y.o. [name] [name] Primary Care Provider: [name] Other Clinician: Referring Provider: Treating Provider/Extender: Quentin Cornwall in Treatment: 31 Subjective Chief Complaint Information obtained from Patient Chronic venous hypertension with ulcer and inflammation, lymphedema 06/13/2019; patient returns to clinic with wound areas on his bilateral lower extremities 11/25/2019; patient returns to clinic with the usual innumerable small wounds on his bilateral lower extremities in the setting of chronic venous inflammation and lymphedema History of Present Illness (HPI) The following HPI elements were documented for the patient's wound: Location: both legs Quality: Patient reports No Pain. long history of chronic venous hypertension,chf,morbid obesity. s/p gsv ablation. cva 2oo4. sleep apnea andhbp. breakdown of skin both legs around 2 months ago. treated here for this in 2015. no .dm. 05/09/2016 -- he had his arterial studies done last week and his right ABI was 1.3 and his left ABI was 1.4. His right toe brachial index was 0.98 and on the left was 0.9. Venous studies have only be done today and reports are awaited. 05/16/2016 -- had a lower extremity venous duplex reflux evaluation which showed venous incompetence noted in the left great saphenous and common femoral veins and a vascular surgery consult was  recommended by Dr. Donnetta Hutching. He had a arterial study done which showed a right ABI of 1.3 which is within normal limits at rest and a left ABI of 1.4 which is within normal limits at rest and may be falsely elevated. The right toe brachial index was 0.98 and the left toe brachial index was 0.90 05/30/2016 -- seen by Dr. Althea Charon -- is known to have a prior laser ablation of his left great saphenous vein in 2009. Prior to that he had 2 ablations of the odd same vein by interventional radiology. As noted to have severe venous hypertension bilaterally. 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 His right great saphenous vein is somewhat dilated but no evidence of reflux. The only deep venous reflux demonstrated was in his left common femoral vein. After every consideration Dr. Donnetta Hutching recommended reattempt at ablation versus removal of his left great saphenous vein in the operating room with the standard vein stripping technique. The patient would consider this and let him know again. 06/20/16 patient continues to wear a juxtalite on the right leg without any open areas. On the lateral aspect of his left leg he has 4 wounds and a small area on the medial area of the left leg. Using silver alginate under Profore 06/27/16 still  no open areas on the right leg. On the lateral aspect of his left leg he has 4 wounds which continue to have a nonviable surface and wheeze been using silver alginate under Profore 07/04/2016 -- patient hasn't yet to contact his vascular surgeon regarding plans for surgical intervention and I believe he is trying his best to avoid surgery. I have again discussed with him the futility of trying to heal this and keep it healed, if he does not agree to surgical intervention 07/11/2016 -- the patient has not had any juxta light ordered for at least 3 years and we will order him a pair. 08/08/2016 -- he has been approved for  Apligraf and they will get this ready for him next week 08/15/2016 -- he has his first Apligraf applied today 08/29/2016 -- he has had his second application of Apligraf today 09/12/2016 -- his Apligraf has not arrived today due to the holiday 09/19/2016 -- he has had his third application of Apligraf today 10/03/2016 -- he has had his fourth application of Apligraf today. 10/18/2016 -- his next Apligraf has not arrived today. He has had chronic problems with his back and was to start on steroids and I have told him there are no objections against this. He is also taking appropriate medications as per his orthopedic doctor. 10/24/2016 -- he is here for his fifth application of Apligraf today. 01/09/2017 -- is been having repeated falls and problems with his back and saw a spine surgeon who has recommended holding his anticoagulation and will have some epidural injections in a few weeks. 03/13/2017 - he had been doing very well with his left lower extremity and the ulcerations that come down significantly. However last week he may have hit himself against a metal cabinet and has started having abrasions and because of this has started weeping from the right lateral calf. He has not used his compression since morning and his right lower extremity has markedly increased and lymphedema 03/27/17; the patient appears to be doing very well only a small cluster of wounds on the right lateral lower extremity. Most of the areas on his left anterior and left posterior leg are closed the wrong way to closing. His compression slipped down today there is irritation where the wrap edge was but no evidence of infection 04/24/2017 -- the patient has been using his lymphedema pumps and is also wearing his new compression on the right lower extremity. 05/01/2017 -- he has begun using his lymphedema pumps for a longer period of time but unfortunately had a fall and may have bruised his left lateral lower extremity under  the 4-layer compression wrap and has multiple ulcerations in this area today 06/05/2017 -- after examination today he is noted to have taken a significant turn for the worse with multiple open ulcerations on his left lower calf and anterior leg. Lymphedema is better controlled and there is no evidence of cellulitis. I believe the patient is not being compliant with his lymphedema pumps. 06/12/2017 -- the patient did not come for his nurse visit change to the left lower extremity on Friday, as advised. He has not been doing his compression appropriately and now has developed a ulcerated area on the right lower extremity. He also has not been using his lymphedema pumps appropriately. in addition to this the patient tells me that he and his wife are going to the beach this coming Sunday for over a week 06/26/2017 -- the patient is back after 2 weeks when he  had gone on vacation and his treatment was substandard and he did not do his lymphedema pump. He does not have any systemic symptoms 08/07/2017 -- he kept his compression stockings all week and says he has been using his compression wraps on the right lower leg. He is also saying he is diligent with his lymphedema pumps. 08/21/17; using his lymphedema pumps about twice a week. He keeps his compression wraps on the left lower leg. He has his compression stocking on the right leg 12/14/18patient continues to be noncompliant with the lymphedema pumps. He has extremitease stockings on the right leg. He has a cluster of wounds on the left leg we have been using silver alginate 09/12/2017 -- over the Christmas holidays his right leg has become extremely large with lymphedema and weeping with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 Denies history of History of Burn Musculoskeletal Patient has history of Osteoarthritis Denies history of Gout, Rheumatoid Arthritis, Osteomyelitis Neurologic Denies history of Dementia, Neuropathy, Quadriplegia, Paraplegia Oncologic Denies history of Received Chemotherapy, Received Radiation Psychiatric Denies history of Anorexia/bulimia Hospitalization/Surgery History - kidney stones. - stroke. - stroke/TIA. Medical A Surgical History Notes nd Constitutional Symptoms (General Health) h/o open leg wound , h/o CVA , TIA , varicose veins Respiratory asbestosis Genitourinary nephrolithiasis Objective Constitutional respirations regular, non-labored and within target range for patient.. Vitals Time Taken: 12:49 PM, Height: 70 in, Weight: 360 lbs, BMI: 51.6, Temperature: 97.5 F, Pulse: 65 bpm, Respiratory Rate: 20 breaths/min, Blood Pressure: 159/83 mmHg. Psychiatric pleasant and cooperative. General Notes: Bilateral lower extremities: Numerous scattered open wounds limited to skin breakdown. Swelling is greater in the left versus right leg. There is 3+ pitting edema to the knees. No signs of infection. Chronic skin changes suggesting venous stasis and lymphedema. Integumentary (Hair, Skin) Wound #65 status is Open. Original cause of wound was Gradually Appeared. The date acquired was: 09/08/2019. The wound has been in treatment 59 weeks. The wound is located on the [facility] Lower Leg. The wound measures  4cm length x 2.1cm width x 0.1cm depth; 6.597cm^2 area and 0.66cm^3 volume. There is Fat Layer (Subcutaneous Tissue) exposed. There is no tunneling or undermining noted. There is a medium amount of serosanguineous drainage noted. The wound margin is distinct with the outline attached to the wound base. There is large (67-100%) red granulation within the wound bed. There is no necrotic tissue within the wound bed. Wound #68 status is Open. Original cause of wound was Gradually Appeared. The date acquired was: 03/23/2020. The wound has been in treatment 42 weeks. The wound is located on the Left,Proximal,Anterior Lower Leg. The wound measures 2.5cm length x 2.6cm width x 0.1cm depth; 5.105cm^2 area and 0.511cm^3 volume. There is Fat Layer (Subcutaneous Tissue) exposed. There is no tunneling or undermining noted. There is a medium amount of serosanguineous drainage noted. The wound margin is distinct with the outline attached to the wound base. There is large (67-100%) red granulation within the wound bed. There is no necrotic tissue within the wound bed. Wound #73 status is Open. Original cause of wound was Gradually Appeared. The date acquired was: 08/03/2020. The wound has been in treatment 21 weeks. The wound is located on the Right,Lateral Lower Leg. The wound measures 3cm length x 1cm width x 0.1cm depth; 2.356cm^2 area and 0.236cm^3 volume. There is Fat Layer (Subcutaneous Tissue) exposed. There is no tunneling or undermining noted. There is a medium amount of serosanguineous drainage noted. The wound margin is distinct with the outline attached to the wound base. There is large (67-100%) red granulation within the wound bed. There is no necrotic tissue within the wound bed. Wound #74 status is Open. Original cause of wound was Gradually Appeared. The date acquired was: 09/28/2020. The wound has been in treatment 15 weeks. The wound is located on the Left,Lateral Lower Leg. The wound measures 3.5cm  length x 7cm width x 0.1cm depth; 19.242cm^2 area and 1.924cm^3 volume. There is Fat Layer (Subcutaneous  Tissue) exposed. There is no tunneling or undermining noted. There is a medium amount of serosanguineous drainage noted. The wound margin is distinct with the outline attached to the wound base. There is large (67-100%) pink, friable granulation within the wound bed. There is a small (1-33%) amount of necrotic tissue within the wound bed including Adherent Slough. Wound #77 status is Open. Original cause of wound was Gradually Appeared. The date acquired was: 11/16/2020. The wound has been in treatment 8 weeks. The wound is located on the Left,Proximal,Lateral Lower Leg. The wound measures 1.1cm length x 4.5cm width x 0.1cm depth; 3.888cm^2 area and 0.389cm^3 volume. There is Fat Layer (Subcutaneous Tissue) exposed. There is no tunneling or undermining noted. There is a medium amount of serosanguineous drainage noted. The wound margin is distinct with the outline attached to the wound base. There is large (67-100%) red, friable granulation within the wound bed. There is a small (1-33%) amount of necrotic tissue within the wound bed including Adherent Slough. Wound #78 status is Open. Original cause of wound was Gradually Appeared. The date acquired was: 11/16/2020. The wound has been in treatment 8 weeks. The wound is located on the Right,Posterior Lower Leg. The wound measures 0.8cm length x 0.6cm width x 0.1cm depth; 0.377cm^2 area and 0.038cm^3 volume. There is Fat Layer (Subcutaneous Tissue) exposed. There is no tunneling or undermining noted. There is a medium amount of serosanguineous drainage noted. The wound margin is distinct with the outline attached to the wound base. There is large (67-100%) red granulation within the wound bed. There is no necrotic tissue within the wound bed. 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 We will continue the 4-layer compression with silver alginate underneath. There are no signs of infection. We discussed increasing the amount of lymphedema pump usage as this should be very beneficial to him. Procedures Wound #65 Pre-procedure diagnosis of Wound #65 is a Venous Leg Ulcer located on the Left,Distal,Anterior Lower Leg . There was a Four Layer Compression Therapy Procedure by Deon Pilling, RN. Post procedure Diagnosis Wound #65: Same as Pre-Procedure Wound #73 Pre-procedure diagnosis of Wound #73 is a Lymphedema located on the Right,Lateral Lower Leg . There was a Four Layer Compression Therapy Procedure by Deon Pilling, RN. Post procedure Diagnosis Wound #73: Same as Pre-Procedure Plan Follow-up Appointments: Return appointment in 1 month. Bathing/ Shower/ Hygiene: May shower with protection but do not get wound dressing(s) wet. Edema Control - Lymphedema / SCD / Other: Lymphedema Pumps. Use Lymphedema pumps on leg(s) 2-3 times a day for 45-60 minutes. If wearing any wraps or hose, do not remove them. Continue exercising as instructed. Elevate legs to the level of the heart or above for 30 minutes daily and/or when sitting, a frequency of: Avoid standing for long periods of time. Exercise regularly Home Health: No change in wound care orders this week; continue Home Health for wound care. May utilize formulary equivalent dressing for wound treatment orders unless otherwise specified. - home health to change 2x a week {if possible Tuesday's and Friday's} Other Home Health Orders/Instructions: - [facility] WOUND #65: - Lower Leg Wound Laterality: Left, Anterior, Distal Cleanser: Normal  Saline (Home Health) (Generic) 2 x Per Week/30 Days Discharge  Instructions: Cleanse the wound with Normal Saline prior to applying a clean dressing using gauze sponges, not tissue or cotton balls. Peri-Wound Care: Sween Lotion (Moisturizing lotion) (Home Health) (Generic) 2 x Per Week/30 Days Discharge Instructions: Apply moisturizing lotion as directed Prim Dressing: KerraCel Ag Gelling Fiber Dressing, 4x5 in (silver alginate) (Home Health) (Generic) 2 x Per Week/30 Days ary Discharge Instructions: Apply silver alginate to wound bed as instructed Secondary Dressing: Woven Gauze Sponge, Non-Sterile 4x4 in (Home Health) (Generic) 2 x Per Week/30 Days Discharge Instructions: Apply over primary dressing as directed. Secondary Dressing: ABD Pad, 5x9 (Home Health) 2 x Per Week/30 Days Discharge Instructions: Apply over primary dressing as directed. Com pression Wrap: FourPress (4 layer compression wrap) (Home Health) (Generic) 2 x Per Week/30 Days Discharge Instructions: Apply four layer compression as directed. WOUND #68: - Lower Leg Wound Laterality: Left, Anterior, Proximal Cleanser: Soap and Water 2 x Per Week/30 Days Discharge Instructions: May shower and wash wound with dial antibacterial soap and water prior to dressing change. Peri-Wound Care: Sween Lotion (Moisturizing lotion) (Home Health) (Generic) 2 x Per Week/30 Days Discharge Instructions: Apply moisturizing lotion as directed Prim Dressing: KerraCel Ag Gelling Fiber Dressing, 4x5 in (silver alginate) (Home Health) (Generic) 2 x Per Week/30 Days ary Discharge Instructions: Apply silver alginate to wound bed as instructed Secondary Dressing: Woven Gauze Sponge, Non-Sterile 4x4 in (Home Health) (Generic) 2 x Per Week/30 Days Discharge Instructions: Apply over primary dressing as directed. Secondary Dressing: ABD Pad, 5x9 (Home Health) 2 x Per Week/30 Days Discharge Instructions: Apply over primary dressing as directed. Com pression  Wrap: FourPress (4 layer compression wrap) (Home Health) (Generic) 2 x Per Week/30 Days Discharge Instructions: Apply four layer compression as directed. WOUND #73: - Lower Leg Wound Laterality: Right, Lateral Cleanser: Soap and Water 2 x Per Week/30 Days Discharge Instructions: May shower and wash wound with dial antibacterial soap and water prior to dressing change. Peri-Wound Care: Sween Lotion (Moisturizing lotion) (Home Health) (Generic) 2 x Per Week/30 Days Discharge Instructions: Apply moisturizing lotion as directed Prim Dressing: KerraCel Ag Gelling Fiber Dressing, 4x5 in (silver alginate) (Home Health) (Generic) 2 x Per Week/30 Days ary Discharge Instructions: Apply silver alginate to wound bed as instructed Secondary Dressing: Woven Gauze Sponge, Non-Sterile 4x4 in (Home Health) (Generic) 2 x Per Week/30 Days Discharge Instructions: Apply over primary dressing as directed. Secondary Dressing: ABD Pad, 5x9 (Home Health) 2 x Per Week/30 Days Discharge Instructions: Apply over primary dressing as directed. Com pression Wrap: FourPress (4 layer compression wrap) (Home Health) (Generic) 2 x Per Week/30 Days Discharge Instructions: Apply four layer compression as directed. WOUND #74: - Lower Leg Wound Laterality: Left, Lateral Cleanser: Soap and Water 2 x Per Week/30 Days Discharge Instructions: May shower and wash wound with dial antibacterial soap and water prior to dressing change. Peri-Wound Care: Sween Lotion (Moisturizing lotion) (Home Health) (Generic) 2 x Per Week/30 Days Discharge Instructions: Apply moisturizing lotion as directed Prim Dressing: KerraCel Ag Gelling Fiber Dressing, 4x5 in (silver alginate) (Home Health) (Generic) 2 x Per Week/30 Days ary Discharge Instructions: Apply silver alginate to wound bed as instructed Secondary Dressing: Woven Gauze Sponge, Non-Sterile 4x4 in (Home Health) (Generic) 2 x Per Week/30 Days Discharge Instructions: Apply over primary  dressing as directed. Secondary Dressing: ABD Pad, 5x9 (Home Health) 2 x Per Week/30 Days Discharge Instructions: Apply over primary dressing as directed. Com pression Wrap: FourPress (4 layer compression wrap) (Home Health) (Generic) 2 x Per Week/30 Days Discharge Instructions: Apply  four layer compression as directed. WOUND #77: - Lower Leg Wound Laterality: Left, Lateral, Proximal Cleanser: Soap and Water 2 x Per Week/30 Days Discharge Instructions: May shower and wash wound with dial antibacterial soap and water prior to dressing change. Peri-Wound Care: Sween Lotion (Moisturizing lotion) (Home Health) (Generic) 2 x Per Week/30 Days Discharge Instructions: Apply moisturizing lotion as directed Prim Dressing: KerraCel Ag Gelling Fiber Dressing, 4x5 in (silver alginate) (Home Health) (Generic) 2 x Per Week/30 Days ary Discharge Instructions: Apply silver alginate to wound bed as instructed Secondary Dressing: Woven Gauze Sponge, Non-Sterile 4x4 in (Home Health) (Generic) 2 x Per Week/30 Days Discharge Instructions: Apply over primary dressing as directed. Secondary Dressing: ABD Pad, 5x9 (Home Health) 2 x Per Week/30 Days Discharge Instructions: Apply over primary dressing as directed. Com pression Wrap: FourPress (4 layer compression wrap) (Home Health) (Generic) 2 x Per Week/30 Days Discharge Instructions: Apply four layer compression as directed. WOUND #78: - Lower Leg Wound Laterality: Right, Posterior Cleanser: Soap and Water 2 x Per Week/30 Days Discharge Instructions: May shower and wash wound with dial antibacterial soap and water prior to dressing change. Peri-Wound Care: Sween Lotion (Moisturizing lotion) (Home Health) (Generic) 2 x Per Week/30 Days Discharge Instructions: Apply moisturizing lotion as directed Prim Dressing: KerraCel Ag Gelling Fiber Dressing, 4x5 in (silver alginate) (Home Health) (Generic) 2 x Per Week/30 Days ary Discharge Instructions: Apply silver alginate  to wound bed as instructed Secondary Dressing: Woven Gauze Sponge, Non-Sterile 4x4 in (Home Health) (Generic) 2 x Per Week/30 Days Discharge Instructions: Apply over primary dressing as directed. Secondary Dressing: ABD Pad, 5x9 (Home Health) 2 x Per Week/30 Days Discharge Instructions: Apply over primary dressing as directed. Com pression Wrap: FourPress (4 layer compression wrap) (Home Health) (Generic) 2 x Per Week/30 Days Discharge Instructions: Apply four layer compression as directed. 1. 4-layer compression with silver alginate 2. Home health to do dressing changes 2 x a week 3. Follow-up in 1 month Electronic Signature(s) Signed: 01/11/2021 1:35:01 PM By: Kalman Shan DO Entered By: Kalman Shan on 01/11/2021 13:33:52 -------------------------------------------------------------------------------- HxROS Details Patient Name: Date of Service: [name] [name], [name] Bryant [name]. 01/11/2021 12:30 PM Medical Record Number: [id number] Patient Account Number: [id number] Date of Birth/Sex: Treating RN: 12-05-1955 (65 y.o. [name] [name] Primary Care Provider: [name] Other Clinician: Referring Provider: Treating Provider/Extender: Quentin Cornwall in Treatment: 40 Information Obtained From Patient Constitutional Symptoms (General Health) Medical History: Past Medical History Notes: h/o open leg wound , h/o CVA , TIA , varicose veins Eyes Medical History: Negative for: Cataracts; Glaucoma; Optic Neuritis Ear/Nose/Mouth/Throat Medical History: Negative for: Chronic sinus problems/congestion; Middle ear problems Hematologic/Lymphatic Medical History: Positive for: Lymphedema - bilateral lower legs Negative for: Anemia; Hemophilia; Human Immunodeficiency Virus Respiratory Medical History: Positive for: Sleep Apnea - obstructive Negative for: Aspiration; Asthma; Chronic Obstructive Pulmonary Disease (COPD); Tuberculosis Past Medical History  Notes: asbestosis Cardiovascular Medical History: Positive for: Congestive Heart Failure - diastolic; Hypertension; Peripheral Venous Disease Negative for: Angina; Arrhythmia; Coronary Artery Disease; Deep Vein Thrombosis; Hypotension; Myocardial Infarction; Peripheral Arterial Disease; Phlebitis; Vasculitis Gastrointestinal Medical History: Negative for: Cirrhosis ; Colitis; Crohns; Hepatitis A; Hepatitis B; Hepatitis C Endocrine Medical History: Negative for: Type I Diabetes; Type II Diabetes Genitourinary Medical History: Negative for: End Stage Renal Disease Past Medical History Notes: nephrolithiasis Immunological Medical History: Negative for: Lupus Erythematosus; Raynauds; Scleroderma Integumentary (Skin) Medical History: Negative for: History of Burn Musculoskeletal Medical History: Positive for: Osteoarthritis Negative for: Gout; Rheumatoid Arthritis; Osteomyelitis Neurologic Medical  History: Negative for: Dementia; Neuropathy; Quadriplegia; Paraplegia Oncologic Medical History: Negative for: Received Chemotherapy; Received Radiation Psychiatric Medical History: Negative for: Anorexia/bulimia Immunizations Pneumococcal Vaccine: Received Pneumococcal Vaccination: No Implantable Devices No devices added Hospitalization / Surgery History Type of Hospitalization/Surgery kidney stones stroke stroke/TIA Family and Social History Cancer: Yes - Mother; Diabetes: Yes - Paternal Grandparents; Heart Disease: Yes - Siblings; Hypertension: Yes - Mother,Father,Siblings; Stroke: Yes - Paternal Grandparents; Never smoker; Marital Status - Married; Alcohol Use: Never; Drug Use: No History; Caffeine Use: Daily - SODA; Financial Concerns: No; Food, Clothing or Shelter Needs: No; Support System Lacking: No; Transportation Concerns: No Electronic Signature(s) Signed: 01/11/2021 1:35:01 PM By: Kalman Shan DO Signed: 01/11/2021 6:28:10 PM By: [name] RN, BSN Entered  By: Kalman Shan on 01/11/2021 13:29:54 -------------------------------------------------------------------------------- SuperBill Details Patient Name: Date of Service: [name] [name], [name] [name]. 01/11/2021 Medical Record Number: [id number] Patient Account Number: [id number] Date of Birth/Sex: Treating RN: 1955/11/15 (65 y.o. [name] [name] Primary Care Provider: [name] Other Clinician: Referring Provider: Treating Provider/Extender: Quentin Cornwall in Treatment: 59 Diagnosis Coding ICD-10 Codes Code Description [id number] Chronic venous hypertension (idiopathic) with ulcer and inflammation of bilateral lower extremity I89.0 Lymphedema, not elsewhere classified L97.821 Non-pressure chronic ulcer of other part of left lower leg limited to breakdown of skin L97.811 Non-pressure chronic ulcer of other part of right lower leg limited to breakdown of skin Facility Procedures CPT4: Code [id number] fo Description: [id number] BILATERAL: Application of multi-layer venous compression system; leg (below knee), including ankle and ot. Modifier: Quantity: 1 Electronic Signature(s) Signed: 01/11/2021 1:35:01 PM By: Kalman Shan DO Entered By: Kalman Shan on 01/11/2021 13:34:34

## 2021-01-12 DIAGNOSIS — L97821 Non-pressure chronic ulcer of other part of left lower leg limited to breakdown of skin: Secondary | ICD-10-CM | POA: Diagnosis not present

## 2021-01-12 DIAGNOSIS — Z7982 Long term (current) use of aspirin: Secondary | ICD-10-CM | POA: Diagnosis not present

## 2021-01-12 DIAGNOSIS — G4733 Obstructive sleep apnea (adult) (pediatric): Secondary | ICD-10-CM | POA: Diagnosis not present

## 2021-01-12 DIAGNOSIS — Z8673 Personal history of transient ischemic attack (TIA), and cerebral infarction without residual deficits: Secondary | ICD-10-CM | POA: Diagnosis not present

## 2021-01-12 DIAGNOSIS — E669 Obesity, unspecified: Secondary | ICD-10-CM | POA: Diagnosis not present

## 2021-01-12 DIAGNOSIS — Z9981 Dependence on supplemental oxygen: Secondary | ICD-10-CM | POA: Diagnosis not present

## 2021-01-12 DIAGNOSIS — Z791 Long term (current) use of non-steroidal anti-inflammatories (NSAID): Secondary | ICD-10-CM | POA: Diagnosis not present

## 2021-01-12 DIAGNOSIS — I110 Hypertensive heart disease with heart failure: Secondary | ICD-10-CM | POA: Diagnosis not present

## 2021-01-12 DIAGNOSIS — Z7902 Long term (current) use of antithrombotics/antiplatelets: Secondary | ICD-10-CM | POA: Diagnosis not present

## 2021-01-12 DIAGNOSIS — L97811 Non-pressure chronic ulcer of other part of right lower leg limited to breakdown of skin: Secondary | ICD-10-CM | POA: Diagnosis not present

## 2021-01-12 DIAGNOSIS — I5032 Chronic diastolic (congestive) heart failure: Secondary | ICD-10-CM | POA: Diagnosis not present

## 2021-01-12 DIAGNOSIS — I87333 Chronic venous hypertension (idiopathic) with ulcer and inflammation of bilateral lower extremity: Secondary | ICD-10-CM | POA: Diagnosis not present

## 2021-01-15 DIAGNOSIS — N200 Calculus of kidney: Secondary | ICD-10-CM | POA: Diagnosis not present

## 2021-01-15 DIAGNOSIS — L97821 Non-pressure chronic ulcer of other part of left lower leg limited to breakdown of skin: Secondary | ICD-10-CM | POA: Diagnosis not present

## 2021-01-15 DIAGNOSIS — G4733 Obstructive sleep apnea (adult) (pediatric): Secondary | ICD-10-CM | POA: Diagnosis not present

## 2021-01-15 DIAGNOSIS — I5033 Acute on chronic diastolic (congestive) heart failure: Secondary | ICD-10-CM | POA: Diagnosis not present

## 2021-01-15 DIAGNOSIS — I110 Hypertensive heart disease with heart failure: Secondary | ICD-10-CM | POA: Diagnosis not present

## 2021-01-15 DIAGNOSIS — E1121 Type 2 diabetes mellitus with diabetic nephropathy: Secondary | ICD-10-CM | POA: Diagnosis not present

## 2021-01-15 DIAGNOSIS — I5032 Chronic diastolic (congestive) heart failure: Secondary | ICD-10-CM | POA: Diagnosis not present

## 2021-01-15 DIAGNOSIS — I87333 Chronic venous hypertension (idiopathic) with ulcer and inflammation of bilateral lower extremity: Secondary | ICD-10-CM | POA: Diagnosis not present

## 2021-01-15 DIAGNOSIS — L97811 Non-pressure chronic ulcer of other part of right lower leg limited to breakdown of skin: Secondary | ICD-10-CM | POA: Diagnosis not present

## 2021-01-18 DIAGNOSIS — I5032 Chronic diastolic (congestive) heart failure: Secondary | ICD-10-CM | POA: Diagnosis not present

## 2021-01-18 DIAGNOSIS — I87333 Chronic venous hypertension (idiopathic) with ulcer and inflammation of bilateral lower extremity: Secondary | ICD-10-CM | POA: Diagnosis not present

## 2021-01-18 DIAGNOSIS — L97821 Non-pressure chronic ulcer of other part of left lower leg limited to breakdown of skin: Secondary | ICD-10-CM | POA: Diagnosis not present

## 2021-01-18 DIAGNOSIS — I110 Hypertensive heart disease with heart failure: Secondary | ICD-10-CM | POA: Diagnosis not present

## 2021-01-18 DIAGNOSIS — G4733 Obstructive sleep apnea (adult) (pediatric): Secondary | ICD-10-CM | POA: Diagnosis not present

## 2021-01-18 DIAGNOSIS — L97811 Non-pressure chronic ulcer of other part of right lower leg limited to breakdown of skin: Secondary | ICD-10-CM | POA: Diagnosis not present

## 2021-01-22 DIAGNOSIS — G4733 Obstructive sleep apnea (adult) (pediatric): Secondary | ICD-10-CM | POA: Diagnosis not present

## 2021-01-22 DIAGNOSIS — L97811 Non-pressure chronic ulcer of other part of right lower leg limited to breakdown of skin: Secondary | ICD-10-CM | POA: Diagnosis not present

## 2021-01-22 DIAGNOSIS — I5032 Chronic diastolic (congestive) heart failure: Secondary | ICD-10-CM | POA: Diagnosis not present

## 2021-01-22 DIAGNOSIS — I87333 Chronic venous hypertension (idiopathic) with ulcer and inflammation of bilateral lower extremity: Secondary | ICD-10-CM | POA: Diagnosis not present

## 2021-01-22 DIAGNOSIS — L97821 Non-pressure chronic ulcer of other part of left lower leg limited to breakdown of skin: Secondary | ICD-10-CM | POA: Diagnosis not present

## 2021-01-22 DIAGNOSIS — I110 Hypertensive heart disease with heart failure: Secondary | ICD-10-CM | POA: Diagnosis not present

## 2021-01-25 DIAGNOSIS — L97811 Non-pressure chronic ulcer of other part of right lower leg limited to breakdown of skin: Secondary | ICD-10-CM | POA: Diagnosis not present

## 2021-01-25 DIAGNOSIS — L97821 Non-pressure chronic ulcer of other part of left lower leg limited to breakdown of skin: Secondary | ICD-10-CM | POA: Diagnosis not present

## 2021-01-25 DIAGNOSIS — G4733 Obstructive sleep apnea (adult) (pediatric): Secondary | ICD-10-CM | POA: Diagnosis not present

## 2021-01-25 DIAGNOSIS — I5032 Chronic diastolic (congestive) heart failure: Secondary | ICD-10-CM | POA: Diagnosis not present

## 2021-01-25 DIAGNOSIS — I87333 Chronic venous hypertension (idiopathic) with ulcer and inflammation of bilateral lower extremity: Secondary | ICD-10-CM | POA: Diagnosis not present

## 2021-01-25 DIAGNOSIS — I110 Hypertensive heart disease with heart failure: Secondary | ICD-10-CM | POA: Diagnosis not present

## 2021-01-29 DIAGNOSIS — I110 Hypertensive heart disease with heart failure: Secondary | ICD-10-CM | POA: Diagnosis not present

## 2021-01-29 DIAGNOSIS — L97811 Non-pressure chronic ulcer of other part of right lower leg limited to breakdown of skin: Secondary | ICD-10-CM | POA: Diagnosis not present

## 2021-01-29 DIAGNOSIS — I87333 Chronic venous hypertension (idiopathic) with ulcer and inflammation of bilateral lower extremity: Secondary | ICD-10-CM | POA: Diagnosis not present

## 2021-01-29 DIAGNOSIS — G4733 Obstructive sleep apnea (adult) (pediatric): Secondary | ICD-10-CM | POA: Diagnosis not present

## 2021-01-29 DIAGNOSIS — L97821 Non-pressure chronic ulcer of other part of left lower leg limited to breakdown of skin: Secondary | ICD-10-CM | POA: Diagnosis not present

## 2021-01-29 DIAGNOSIS — I5032 Chronic diastolic (congestive) heart failure: Secondary | ICD-10-CM | POA: Diagnosis not present

## 2021-01-31 DIAGNOSIS — I110 Hypertensive heart disease with heart failure: Secondary | ICD-10-CM | POA: Diagnosis not present

## 2021-01-31 DIAGNOSIS — L97811 Non-pressure chronic ulcer of other part of right lower leg limited to breakdown of skin: Secondary | ICD-10-CM | POA: Diagnosis not present

## 2021-01-31 DIAGNOSIS — I5032 Chronic diastolic (congestive) heart failure: Secondary | ICD-10-CM | POA: Diagnosis not present

## 2021-01-31 DIAGNOSIS — G4733 Obstructive sleep apnea (adult) (pediatric): Secondary | ICD-10-CM | POA: Diagnosis not present

## 2021-01-31 DIAGNOSIS — L97821 Non-pressure chronic ulcer of other part of left lower leg limited to breakdown of skin: Secondary | ICD-10-CM | POA: Diagnosis not present

## 2021-01-31 DIAGNOSIS — I87333 Chronic venous hypertension (idiopathic) with ulcer and inflammation of bilateral lower extremity: Secondary | ICD-10-CM | POA: Diagnosis not present

## 2021-02-05 DIAGNOSIS — I87333 Chronic venous hypertension (idiopathic) with ulcer and inflammation of bilateral lower extremity: Secondary | ICD-10-CM | POA: Diagnosis not present

## 2021-02-05 DIAGNOSIS — L97821 Non-pressure chronic ulcer of other part of left lower leg limited to breakdown of skin: Secondary | ICD-10-CM | POA: Diagnosis not present

## 2021-02-05 DIAGNOSIS — I110 Hypertensive heart disease with heart failure: Secondary | ICD-10-CM | POA: Diagnosis not present

## 2021-02-05 DIAGNOSIS — L97811 Non-pressure chronic ulcer of other part of right lower leg limited to breakdown of skin: Secondary | ICD-10-CM | POA: Diagnosis not present

## 2021-02-05 DIAGNOSIS — I5032 Chronic diastolic (congestive) heart failure: Secondary | ICD-10-CM | POA: Diagnosis not present

## 2021-02-05 DIAGNOSIS — G4733 Obstructive sleep apnea (adult) (pediatric): Secondary | ICD-10-CM | POA: Diagnosis not present

## 2021-02-07 DIAGNOSIS — L97821 Non-pressure chronic ulcer of other part of left lower leg limited to breakdown of skin: Secondary | ICD-10-CM | POA: Diagnosis not present

## 2021-02-07 DIAGNOSIS — I87333 Chronic venous hypertension (idiopathic) with ulcer and inflammation of bilateral lower extremity: Secondary | ICD-10-CM | POA: Diagnosis not present

## 2021-02-07 DIAGNOSIS — I110 Hypertensive heart disease with heart failure: Secondary | ICD-10-CM | POA: Diagnosis not present

## 2021-02-07 DIAGNOSIS — L97811 Non-pressure chronic ulcer of other part of right lower leg limited to breakdown of skin: Secondary | ICD-10-CM | POA: Diagnosis not present

## 2021-02-07 DIAGNOSIS — I5032 Chronic diastolic (congestive) heart failure: Secondary | ICD-10-CM | POA: Diagnosis not present

## 2021-02-07 DIAGNOSIS — G4733 Obstructive sleep apnea (adult) (pediatric): Secondary | ICD-10-CM | POA: Diagnosis not present

## 2021-02-08 ENCOUNTER — Encounter (HOSPITAL_BASED_OUTPATIENT_CLINIC_OR_DEPARTMENT_OTHER): Payer: Medicare Other | Admitting: Internal Medicine

## 2021-02-11 DIAGNOSIS — E669 Obesity, unspecified: Secondary | ICD-10-CM | POA: Diagnosis not present

## 2021-02-11 DIAGNOSIS — L97821 Non-pressure chronic ulcer of other part of left lower leg limited to breakdown of skin: Secondary | ICD-10-CM | POA: Diagnosis not present

## 2021-02-11 DIAGNOSIS — Z7902 Long term (current) use of antithrombotics/antiplatelets: Secondary | ICD-10-CM | POA: Diagnosis not present

## 2021-02-11 DIAGNOSIS — L97811 Non-pressure chronic ulcer of other part of right lower leg limited to breakdown of skin: Secondary | ICD-10-CM | POA: Diagnosis not present

## 2021-02-11 DIAGNOSIS — I87333 Chronic venous hypertension (idiopathic) with ulcer and inflammation of bilateral lower extremity: Secondary | ICD-10-CM | POA: Diagnosis not present

## 2021-02-11 DIAGNOSIS — G4733 Obstructive sleep apnea (adult) (pediatric): Secondary | ICD-10-CM | POA: Diagnosis not present

## 2021-02-11 DIAGNOSIS — Z791 Long term (current) use of non-steroidal anti-inflammatories (NSAID): Secondary | ICD-10-CM | POA: Diagnosis not present

## 2021-02-11 DIAGNOSIS — I5032 Chronic diastolic (congestive) heart failure: Secondary | ICD-10-CM | POA: Diagnosis not present

## 2021-02-11 DIAGNOSIS — Z8673 Personal history of transient ischemic attack (TIA), and cerebral infarction without residual deficits: Secondary | ICD-10-CM | POA: Diagnosis not present

## 2021-02-11 DIAGNOSIS — Z7982 Long term (current) use of aspirin: Secondary | ICD-10-CM | POA: Diagnosis not present

## 2021-02-11 DIAGNOSIS — Z9981 Dependence on supplemental oxygen: Secondary | ICD-10-CM | POA: Diagnosis not present

## 2021-02-11 DIAGNOSIS — I110 Hypertensive heart disease with heart failure: Secondary | ICD-10-CM | POA: Diagnosis not present

## 2021-02-12 DIAGNOSIS — L97811 Non-pressure chronic ulcer of other part of right lower leg limited to breakdown of skin: Secondary | ICD-10-CM | POA: Diagnosis not present

## 2021-02-12 DIAGNOSIS — I5032 Chronic diastolic (congestive) heart failure: Secondary | ICD-10-CM | POA: Diagnosis not present

## 2021-02-12 DIAGNOSIS — L97821 Non-pressure chronic ulcer of other part of left lower leg limited to breakdown of skin: Secondary | ICD-10-CM | POA: Diagnosis not present

## 2021-02-12 DIAGNOSIS — I87333 Chronic venous hypertension (idiopathic) with ulcer and inflammation of bilateral lower extremity: Secondary | ICD-10-CM | POA: Diagnosis not present

## 2021-02-12 DIAGNOSIS — G4733 Obstructive sleep apnea (adult) (pediatric): Secondary | ICD-10-CM | POA: Diagnosis not present

## 2021-02-12 DIAGNOSIS — I110 Hypertensive heart disease with heart failure: Secondary | ICD-10-CM | POA: Diagnosis not present

## 2021-02-15 ENCOUNTER — Encounter (HOSPITAL_BASED_OUTPATIENT_CLINIC_OR_DEPARTMENT_OTHER): Payer: Medicare Other | Attending: Internal Medicine | Admitting: Internal Medicine

## 2021-02-15 ENCOUNTER — Other Ambulatory Visit: Payer: Self-pay

## 2021-02-15 DIAGNOSIS — G4733 Obstructive sleep apnea (adult) (pediatric): Secondary | ICD-10-CM | POA: Insufficient documentation

## 2021-02-15 DIAGNOSIS — L97811 Non-pressure chronic ulcer of other part of right lower leg limited to breakdown of skin: Secondary | ICD-10-CM | POA: Diagnosis not present

## 2021-02-15 DIAGNOSIS — L97821 Non-pressure chronic ulcer of other part of left lower leg limited to breakdown of skin: Secondary | ICD-10-CM | POA: Diagnosis not present

## 2021-02-15 DIAGNOSIS — I5030 Unspecified diastolic (congestive) heart failure: Secondary | ICD-10-CM | POA: Insufficient documentation

## 2021-02-15 DIAGNOSIS — I110 Hypertensive heart disease with heart failure: Secondary | ICD-10-CM | POA: Diagnosis not present

## 2021-02-15 DIAGNOSIS — Z9119 Patient's noncompliance with other medical treatment and regimen: Secondary | ICD-10-CM | POA: Insufficient documentation

## 2021-02-15 DIAGNOSIS — I890 Lymphedema, not elsewhere classified: Secondary | ICD-10-CM | POA: Diagnosis not present

## 2021-02-15 DIAGNOSIS — Z8616 Personal history of COVID-19: Secondary | ICD-10-CM | POA: Diagnosis not present

## 2021-02-15 DIAGNOSIS — I87333 Chronic venous hypertension (idiopathic) with ulcer and inflammation of bilateral lower extremity: Secondary | ICD-10-CM | POA: Insufficient documentation

## 2021-02-15 DIAGNOSIS — Z8673 Personal history of transient ischemic attack (TIA), and cerebral infarction without residual deficits: Secondary | ICD-10-CM | POA: Insufficient documentation

## 2021-02-15 NOTE — Progress Notes (Signed)
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 has good edema control and compression wraps. His wife is back at [facility] he says he has not been using the external compression pumps. Silver alginate to the wounds 5/4; he has good edema control bilaterally. He has not been using the compression pumps he tells [place] his wife is coming home from [place] today. He does not have any open wounds on the right leg continued large collection of small wounds on the left anterior leg extending medially into laterally nothing much posteriorly on the left. 6/1. He has a smattering of small wounds anteriorly on the left  and a larger but superficial wound on the left posterior calf. There is nothing open on the right we have been using silver alginate on the left I will change that the [facility] today 6/22; there is nothing open on his right leg. He has a smattering of small eschars on the left anterior lower leg but no open wounds per se. Somebody applied the compression wrap on the right leg in a very irregular fashion. He has a very tender area on the right medial ankle. 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 Not surprisingly comes back in with wounds on his bilateral legs at the level of where his external  compression garment was tight superiorly. He tells me he is using his pumps once every second day. 7/20; 2-week follow-up the patient has not been using his compression pumps his wife was transferred to a new nursing home. He has 1 wound on the medial left lower leg and a small area on the right lateral 8/17; patient arrives today with only a smattering of small wounds on the left anterior lower leg most of these are eschared and look benign. There is nothing on his right leg. We have been using silver alginate under 4-layer compression 8/31; patient's wounds on the left anterior lower leg are larger. There is still nothing open on the right toe although we did put compression wraps on this last time. He has home health. Says he is used his compression pumps twice in the last 3 days. Obviously not sufficient 9/14; 2-week follow-up. We discharged him in his own zippered stocking. Apparently home health helped him change this and noted weeping edema fluid therefore put him back in a wrap he arrives in the clinic today with no open wounds on the right innumerable small broken areas on the anterior left calf. Uncontrolled edema above the level of the wrap compatible with lymphedema 9/27 2-week follow-up. He arrives today with the left anterior lower extremity looking better. On the right he has a small open area posteriorly. I am going to put him back in compression on both sides. He claims compliance with his compression pumps at home twice a day he has home health changing his dressing 10/26; 1 month follow-up he has home health changing his dressings there is no open wound on the right leg he has extremitease stockings and external compression pumps which he says he is using once a day. The left leg is always been a more difficult site and although it is difficult to identify any precise open wound he has several leaking areas especially anteriorly and superiorly and at the edge of his wraps 11/9;  2-week follow-up. He has his extremities stocking on the right. He says he has been using his compression pumps once a day. He has 1 remaining wound on the left anterior mid tibia which looks healthy his edema control is otherwise good on the left 11/30; there is now a 3-week follow-up. We use his own compression stocking on the right last time as he has no open wounds. He apparently developed a UTI received a course of ciprofloxacin. He did not wear his compression pumps and I wonder whether he actually was using his stocking. Home health put a compression wrap on his right leg last week. He has multiple small open areas on the left anterior leg this week. 12/7; still a cluster of small areas on the left lateral calf  and the right lateral calf. He has home health changing his dressings. For some reason he will not use compression pumps reliably this week because his wife spent 2 days in the hospital with anemia 12/21; he continues to have a cluster of small areas predominantly on the left lateral but also the left anterior lower leg. More defined wounds on the right anterior and right medial. The he says he is using the compression pumps every other day. I have never understood the issue for this noncompliant. He still has home health coming out 1/11 the patient continues to have multiple small open areas on the left lateral left anterior and medial lower leg anteriorly. He also has a small punched-out painful area in the crease of his left ankle which I think is probably a rapid injury. Very tender. I debrided in this area very adherent debris. Also small areas on the right lateral calf. We have been using silver alginate. His compression pump usage is probably marginal 2/8; the patient comes back in with his legs essentially in the same condition. He has multiple small areas on his bilateral lower extremities left greater than right anteriorly and posteriorly. A lot of these have small eschars on  top some of them do not. His edema control is marginal. He says he uses his pumps once a day. He has home health changing his dressing 4-layer compression silver alginate as the primary dressing 3/1; Marked deterioration this visit. He has multiple small areas across the entirety of the lateral anterior and medial left lower leg. I am actually concerned that he may be on his way to a more substantive degree of breakdown in this area. He is using his compression pumps 4 times per week. He has home health changing this dressing week 3/29; again not much improvement from last time he is here significant bilateral small wounds for the most part skin breakdown. The larger areas are on the left lateral. Small areas bilaterally. He has poor edema control. Relates that he is not using his compression pumps with any reliability. He states his wife was in the hospital last week. He does not complain of chest pain or shortness of breath 4/26; patient presents for 1 month follow-up. He reports no issues. He states that he last used his lymphedema pumps several weeks ago. He has been having his legs wrapped bilaterally with home health with 4-layer compression and silver alginate underneath. He has no complaints today. 5/31; patient presents for 1 month follow-up. He tolerates the compression wraps well. He states that home health continues to come out twice weekly T o change theses. He is unable to use his lymphedema pumps due to the fact that he has to take his shoes off. He states he cannot put his shoes on by himself. He denies any issues today. He denies signs of infection. Electronic Signature(s) Signed: 02/15/2021 2:07:51 PM By: Kalman Shan DO Entered By: Kalman Shan on 02/15/2021 14:04:16 -------------------------------------------------------------------------------- Physical Exam Details Patient Name: Date of Service: [name] [name] [name] RDIN W. 02/15/2021 12:30 PM Medical Record Number:  [id number] Patient Account Number: [id number] Date of Birth/Sex: Treating RN: [birth year] (64 y.o. [name] [name] [name] Primary Care Provider: [name] Other Clinician: Referring Provider: Treating Provider/Extender: Quentin Cornwall in Treatment: 76 Constitutional respirations regular, non-labored and within target range for patient.. Cardiovascular 2+ dorsalis pedis/posterior tibialis pulses. Psychiatric pleasant and cooperative. Notes Bilateral lower extremities: Numerous scattered open wounds limited to skin breakdown. Swelling is greater in the  left versus right leg. There is 3+ pitting edema to the knees. No signs of infection. Chronic skin changes suggesting venous stasis and lymphedema. Electronic Signature(s) Signed: 02/15/2021 2:07:51 PM By: Kalman Shan DO Entered By: Kalman Shan on 02/15/2021 14:04:53 -------------------------------------------------------------------------------- Physician Orders Details Patient Name: Date of Service: [name] [name] [name], [name] [name] RDIN W. 02/15/2021 12:30 PM Medical Record Number: [id number] Patient Account Number: [id number] Date of Birth/Sex: Treating RN: 1956-08-28 (65 y.o. [name] [name] [name], Lauren Primary Care Provider: [name] Other Clinician: Referring Provider: Treating Provider/Extender: Quentin Cornwall in Treatment: 2 Verbal / Phone Orders: No Diagnosis Coding ICD-10 Coding Code Description [phone] Chronic venous hypertension (idiopathic) with ulcer and inflammation of bilateral lower extremity I89.0 Lymphedema, not elsewhere classified L97.821 Non-pressure chronic ulcer of other part of left lower leg limited to breakdown of skin L97.811 Non-pressure chronic ulcer of other part of right lower leg limited to breakdown of skin Follow-up Appointments Return appointment in 1 month. Bathing/ Shower/ Hygiene May shower with protection but do not get wound dressing(s) wet. Edema Control -  Lymphedema / SCD / Other Lymphedema Pumps. Use Lymphedema pumps on leg(s) 2-3 times a day for 45-60 minutes. If wearing any wraps or hose, do not remove them. Continue exercising as instructed. Elevate legs to the level of the heart or above for 30 minutes daily and/or when sitting, a frequency of: Avoid standing for long periods of time. Exercise regularly Home Health No change in wound care orders this week; continue Home Health for wound care. May utilize formulary equivalent dressing for wound treatment orders unless otherwise specified. - home health to change 2x a week {if possible Tuesday's and Friday's} Other Home Health Orders/Instructions: - [facility]ound Treatment Wound #65 - Lower Leg Wound Laterality: Left, Anterior, Distal Cleanser: Normal Saline (Home Health) (Generic) 2 x Per Week/30 Days Discharge Instructions: Cleanse the wound with Normal Saline prior to applying a clean dressing using gauze sponges, not tissue or cotton balls. Peri-Wound Care: Triamcinolone 15 (g) 2 x Per Week/30 Days Discharge Instructions: Use triamcinolone 15 (g) as directed Peri-Wound Care: Sween Lotion (Moisturizing lotion) (Home Health) (Generic) 2 x Per Week/30 Days Discharge Instructions: Apply moisturizing lotion as directed Prim Dressing: KerraCel Ag Gelling Fiber Dressing, 4x5 in (silver alginate) (Home Health) (Generic) 2 x Per Week/30 Days ary Discharge Instructions: Apply silver alginate to wound bed as instructed Secondary Dressing: Woven Gauze Sponge, Non-Sterile 4x4 in (Home Health) (Generic) 2 x Per Week/30 Days Discharge Instructions: Apply over primary dressing as directed. Secondary Dressing: ABD Pad, 5x9 (Home Health) 2 x Per Week/30 Days Discharge Instructions: Apply over primary dressing as directed. Compression Wrap: FourPress (4 layer compression wrap) (Home Health) (Generic) 2 x Per Week/30 Days Discharge Instructions: Apply four layer compression as directed. Wound #68 -  Lower Leg Wound Laterality: Left, Anterior, Proximal Cleanser: Normal Saline (Home Health) (Generic) 2 x Per Week/30 Days Discharge Instructions: Cleanse the wound with Normal Saline prior to applying a clean dressing using gauze sponges, not tissue or cotton balls. Peri-Wound Care: Triamcinolone 15 (g) 2 x Per Week/30 Days Discharge Instructions: Use triamcinolone 15 (g) as directed Peri-Wound Care: Sween Lotion (Moisturizing lotion) (Home Health) (Generic) 2 x Per Week/30 Days Discharge Instructions: Apply moisturizing lotion as directed Prim Dressing: KerraCel Ag Gelling Fiber Dressing, 4x5 in (silver alginate) (Home Health) (Generic) 2 x Per Week/30 Days ary Discharge Instructions: Apply silver alginate to wound bed as instructed Secondary Dressing: Woven Gauze Sponge, Non-Sterile 4x4 in (Home Health) (Generic) 2 x Per Week/30  Days Discharge Instructions: Apply over primary dressing as directed. Secondary Dressing: ABD Pad, 5x9 (Home Health) 2 x Per Week/30 Days Discharge Instructions: Apply over primary dressing as directed. Compression Wrap: FourPress (4 layer compression wrap) (Home Health) (Generic) 2 x Per Week/30 Days Discharge Instructions: Apply four layer compression as directed. Wound #73 - Lower Leg Wound Laterality: Right, Lateral Cleanser: Normal Saline (Home Health) (Generic) 2 x Per Week/30 Days Discharge Instructions: Cleanse the wound with Normal Saline prior to applying a clean dressing using gauze sponges, not tissue or cotton balls. Peri-Wound Care: Triamcinolone 15 (g) 2 x Per Week/30 Days Discharge Instructions: Use triamcinolone 15 (g) as directed Peri-Wound Care: Sween Lotion (Moisturizing lotion) (Home Health) (Generic) 2 x Per Week/30 Days Discharge Instructions: Apply moisturizing lotion as directed Prim Dressing: KerraCel Ag Gelling Fiber Dressing, 4x5 in (silver alginate) (Home Health) (Generic) 2 x Per Week/30 Days ary Discharge Instructions: Apply silver  alginate to wound bed as instructed Secondary Dressing: Woven Gauze Sponge, Non-Sterile 4x4 in (Home Health) (Generic) 2 x Per Week/30 Days Discharge Instructions: Apply over primary dressing as directed. Secondary Dressing: ABD Pad, 5x9 (Home Health) 2 x Per Week/30 Days Discharge Instructions: Apply over primary dressing as directed. Compression Wrap: FourPress (4 layer compression wrap) (Home Health) (Generic) 2 x Per Week/30 Days Discharge Instructions: Apply four layer compression as directed. Wound #74 - Lower Leg Wound Laterality: Left, Lateral Cleanser: Normal Saline (Home Health) (Generic) 2 x Per Week/30 Days Discharge Instructions: Cleanse the wound with Normal Saline prior to applying a clean dressing using gauze sponges, not tissue or cotton balls. Peri-Wound Care: Triamcinolone 15 (g) 2 x Per Week/30 Days Discharge Instructions: Use triamcinolone 15 (g) as directed Peri-Wound Care: Sween Lotion (Moisturizing lotion) (Home Health) (Generic) 2 x Per Week/30 Days Discharge Instructions: Apply moisturizing lotion as directed Prim Dressing: KerraCel Ag Gelling Fiber Dressing, 4x5 in (silver alginate) (Home Health) (Generic) 2 x Per Week/30 Days ary Discharge Instructions: Apply silver alginate to wound bed as instructed Secondary Dressing: Woven Gauze Sponge, Non-Sterile 4x4 in (Home Health) (Generic) 2 x Per Week/30 Days Discharge Instructions: Apply over primary dressing as directed. Secondary Dressing: ABD Pad, 5x9 (Home Health) 2 x Per Week/30 Days Discharge Instructions: Apply over primary dressing as directed. Compression Wrap: FourPress (4 layer compression wrap) (Home Health) (Generic) 2 x Per Week/30 Days Discharge Instructions: Apply four layer compression as directed. Wound #77 - Lower Leg Wound Laterality: Left, Lateral, Proximal Cleanser: Normal Saline (Home Health) (Generic) 2 x Per Week/30 Days Discharge Instructions: Cleanse the wound with Normal Saline prior to  applying a clean dressing using gauze sponges, not tissue or cotton balls. Peri-Wound Care: Triamcinolone 15 (g) 2 x Per Week/30 Days Discharge Instructions: Use triamcinolone 15 (g) as directed Peri-Wound Care: Sween Lotion (Moisturizing lotion) (Home Health) (Generic) 2 x Per Week/30 Days Discharge Instructions: Apply moisturizing lotion as directed Prim Dressing: KerraCel Ag Gelling Fiber Dressing, 4x5 in (silver alginate) (Home Health) (Generic) 2 x Per Week/30 Days ary Discharge Instructions: Apply silver alginate to wound bed as instructed Secondary Dressing: Woven Gauze Sponge, Non-Sterile 4x4 in (Home Health) (Generic) 2 x Per Week/30 Days Discharge Instructions: Apply over primary dressing as directed. Secondary Dressing: ABD Pad, 5x9 (Home Health) 2 x Per Week/30 Days Discharge Instructions: Apply over primary dressing as directed. Compression Wrap: FourPress (4 layer compression wrap) (Home Health) (Generic) 2 x Per Week/30 Days Discharge Instructions: Apply four layer compression as directed. Wound #78 - Lower Leg Wound Laterality: Right, Posterior Cleanser: Normal  Saline [facility]) (Generic) 2 x Per Week/30 Days Discharge Instructions: Cleanse the wound with Normal Saline prior to applying a clean dressing using gauze sponges, not tissue or cotton balls. Peri-Wound Care: Triamcinolone 15 (g) 2 x Per Week/30 Days Discharge Instructions: Use triamcinolone 15 (g) as directed Peri-Wound Care: Sween Lotion (Moisturizing lotion) (Home Health) (Generic) 2 x Per Week/30 Days Discharge Instructions: Apply moisturizing lotion as directed Prim Dressing: KerraCel Ag Gelling Fiber Dressing, 4x5 in (silver alginate) (Home Health) (Generic) 2 x Per Week/30 Days ary Discharge Instructions: Apply silver alginate to wound bed as instructed Secondary Dressing: Woven Gauze Sponge, Non-Sterile 4x4 in (Home Health) (Generic) 2 x Per Week/30 Days Discharge Instructions: Apply over primary dressing  as directed. Secondary Dressing: ABD Pad, 5x9 (Home Health) 2 x Per Week/30 Days Discharge Instructions: Apply over primary dressing as directed. Compression Wrap: FourPress (4 layer compression wrap) (Home Health) (Generic) 2 x Per Week/30 Days Discharge Instructions: Apply four layer compression as directed. Electronic Signature(s) Signed: 02/15/2021 2:07:51 PM By: Kalman Shan DO Entered By: Kalman Shan on 02/15/2021 14:05:30 -------------------------------------------------------------------------------- Problem List Details Patient Name: Date of Service: [name] [name] [name], [name] [name] RDIN W. 02/15/2021 12:30 PM Medical Record Number: [id number] Patient Account Number: [id number] Date of Birth/Sex: Treating RN: [birth year] (65 y.o. [name] [name] [name], Lauren Primary Care Provider: [name] Other Clinician: Referring Provider: Treating Provider/Extender: Quentin Cornwall in Treatment: 64 Active Problems ICD-10 Encounter Code Description Active Date MDM Diagnosis I87.333 Chronic venous hypertension (idiopathic) with ulcer and inflammation of 11/25/2019 No Yes bilateral lower extremity I89.0 Lymphedema, not elsewhere classified 11/25/2019 No Yes L97.821 Non-pressure chronic ulcer of other part of left lower leg limited to breakdown 11/25/2019 No Yes of skin L97.811 Non-pressure chronic ulcer of other part of right lower leg limited to breakdown 11/25/2019 No Yes of skin Inactive Problems ICD-10 Code Description Active Date Inactive Date L03.115 Cellulitis of right lower limb 03/09/2020 03/09/2020 Resolved Problems Electronic Signature(s) Signed: 02/15/2021 2:07:51 PM By: Kalman Shan DO Entered By: Kalman Shan on 02/15/2021 14:01:38 -------------------------------------------------------------------------------- Progress Note Details Patient Name: Date of Service: [name] [name] [name], [name] [name] RDIN W. 02/15/2021 12:30 PM Medical Record Number: [id number] Patient Account Number:  [id number] Date of Birth/Sex: Treating RN: 02/25/1956 (65 y.o. [name] [name] [name], Lauren Primary Care Provider: [name] Other Clinician: Referring Provider: Treating Provider/Extender: Quentin Cornwall in Treatment: 64 Subjective Chief Complaint Information obtained from Patient Chronic venous hypertension with ulcer and inflammation, lymphedema 06/13/2019; patient returns to clinic with wound areas on his bilateral lower extremities 11/25/2019; patient returns to clinic with the usual innumerable small wounds on his bilateral lower extremities in the setting of chronic venous inflammation and lymphedema History of Present Illness (HPI) The following HPI elements were documented for the patient's wound: Location: both legs Quality: Patient reports No Pain. long history of chronic venous hypertension,chf,morbid obesity. s/p gsv ablation. cva 2oo4. sleep apnea andhbp. breakdown of skin both legs around 2 months ago. treated here for this in 2015. no .dm. 05/09/2016 -- he had his arterial studies done last week and his right ABI was 1.3 and his left ABI was 1.4. His right toe brachial index was 0.98 and on the left was 0.9. Venous studies have only be done today and reports are awaited. 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 Donnetta Hutching. He had a arterial study done which showed  a right ABI of 1.3 which is within normal limits at rest and a left ABI of 1.4 which is within normal limits at rest and may be falsely elevated. The right toe brachial index was 0.98 and the left toe brachial index was 0.90 05/30/2016 -- seen by Dr. Althea Charon -- is known to have a prior laser ablation of his left great saphenous vein in 2009. Prior to that he had 2 ablations of the odd same vein by interventional radiology. As noted to  have severe venous hypertension bilaterally. 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 His right great saphenous vein is somewhat dilated but no evidence of reflux. The only deep venous reflux demonstrated was in his left common femoral vein. After every consideration Dr. Donnetta Hutching recommended reattempt at ablation versus removal of his left great saphenous vein in the operating room with the standard vein stripping technique. The patient would consider this and let him know again. 06/20/16 patient continues to wear a juxtalite on the right leg without any open areas. On the lateral aspect of his left leg he has 4 wounds and a small area on the medial area of the left leg. Using silver alginate under Profore 06/27/16 still no open areas on the right leg. On the lateral aspect of his left leg he has 4 wounds which continue to have a nonviable surface and wheeze been using silver alginate under Profore 07/04/2016 -- patient hasn't yet to contact his vascular surgeon regarding plans for surgical intervention and I believe he is trying his best to avoid surgery. I have again discussed with him the futility of trying to heal this and keep it healed, if he does not agree to surgical intervention 07/11/2016 -- the patient has not had any juxta light ordered for at least 3 years and we will order him a pair. 08/08/2016 -- he has been approved for Apligraf and they will get this ready for him next week 08/15/2016 -- he has his first Apligraf applied today 08/29/2016 -- he has had his second application of Apligraf today 09/12/2016 -- his Apligraf has not arrived today due to the holiday 09/19/2016 -- he has had his third application of Apligraf today 10/03/2016 -- he has had his fourth application of Apligraf today. 10/18/2016 -- his next Apligraf has not arrived today. He has had chronic problems with his back and was to start on steroids and  I have told him there are no objections against this. He is also taking appropriate medications as per his orthopedic doctor. 10/24/2016 -- he is here for his fifth application of Apligraf today. 01/09/2017 -- is been having repeated falls and problems with his back and saw a spine surgeon who has recommended holding his anticoagulation and will have some epidural injections in a few weeks. 03/13/2017 - he had been doing very well with his left lower extremity and the ulcerations that come down significantly. However last week he may have hit himself against a metal cabinet and has started having abrasions and because of this has started weeping from the right lateral calf. He has not used his compression since morning and his right lower extremity has markedly increased and lymphedema 03/27/17; the patient appears to be doing very well only a small cluster of wounds on the right lateral lower extremity. Most of the areas on his left anterior and left posterior leg are closed the wrong way to closing. His compression slipped down  today there is irritation where the wrap edge was but no evidence of infection 04/24/2017 -- the patient has been using his lymphedema pumps and is also wearing his new compression on the right lower extremity. 05/01/2017 -- he has begun using his lymphedema pumps for a longer period of time but unfortunately had a fall and may have bruised his left lateral lower extremity under the 4-layer compression wrap and has multiple ulcerations in this area today 06/05/2017 -- after examination today he is noted to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 wrapped bilaterally with home health with 4-layer compression and silver alginate underneath. He has no complaints today. 5/31; patient presents for 1 month follow-up. He tolerates the compression wraps well. He states that home health continues to come out twice weekly T o change theses. He is unable to use his lymphedema pumps due to the fact that he has to take his shoes off. He states he cannot put his shoes on by himself. He denies any issues today. He denies signs of infection. Patient History Information obtained from Patient. Family History Cancer - Mother, Diabetes - Paternal Grandparents, Heart Disease - Siblings, Hypertension - Mother,Father,Siblings, Stroke - Paternal Grandparents. Social History Never smoker, Marital Status - Married, Alcohol Use - Never, Drug Use - No History, Caffeine Use - Daily - SODA. Medical History Eyes Denies history of Cataracts, Glaucoma, Optic Neuritis Ear/Nose/Mouth/Throat Denies history of Chronic sinus problems/congestion, Middle ear problems Hematologic/Lymphatic Patient has history of Lymphedema - bilateral lower legs Denies history of Anemia, Hemophilia, Human Immunodeficiency Virus Respiratory Patient has history of Sleep Apnea - obstructive Denies history of Aspiration, Asthma, Chronic Obstructive Pulmonary Disease (COPD), Tuberculosis Cardiovascular Patient has history of Congestive Heart Failure - diastolic, Hypertension, Peripheral Venous Disease Denies history of Angina, Arrhythmia, Coronary Artery Disease, Deep Vein Thrombosis, Hypotension, Myocardial Infarction, Peripheral Arterial  Disease, Phlebitis, Vasculitis Gastrointestinal Denies history of Cirrhosis , Colitis, Crohnoos, Hepatitis A, Hepatitis B, Hepatitis C Endocrine Denies history of Type I Diabetes, Type II Diabetes Genitourinary Denies history of End Stage Renal Disease Immunological Denies history of Lupus Erythematosus, Raynaudoos, Scleroderma Integumentary (Skin) Denies history of History of Burn Musculoskeletal Patient has history of Osteoarthritis Denies history of Gout, Rheumatoid Arthritis, Osteomyelitis Neurologic Denies history of Dementia, Neuropathy, Quadriplegia, Paraplegia Oncologic Denies history of Received Chemotherapy, Received Radiation Psychiatric Denies history of Anorexia/bulimia Hospitalization/Surgery History - kidney stones. - stroke. - stroke/TIA. Medical A Surgical History Notes nd Constitutional Symptoms (General Health) h/o open leg wound , h/o CVA , TIA , varicose veins Respiratory asbestosis Genitourinary nephrolithiasis Objective Constitutional respirations regular, non-labored and within target range for patient.. Vitals Time Taken: 12:45 PM, Height: 70 in, Weight: 360 lbs, BMI: 51.6, Temperature: 97.8 F, Pulse: 71 bpm, Respiratory Rate: 20 breaths/min, Blood Pressure: 165/80 mmHg. Cardiovascular 2+ dorsalis pedis/posterior tibialis pulses. Psychiatric pleasant and cooperative. General Notes: Bilateral lower extremities: Numerous scattered open wounds limited to skin breakdown. Swelling is greater in the left versus right leg. There is 3+ pitting edema to the knees. No signs of infection. Chronic skin changes suggesting venous stasis and lymphedema. Integumentary (Hair, Skin) Wound #65 status is Open. Original cause of wound was Gradually Appeared. The date acquired was: 09/08/2019. The wound has been in treatment 64 weeks. The wound is located on the [facility] Lower Leg. The wound measures 0.6cm length x 0.6cm width x 0.1cm depth; 0.283cm^2  area and 0.028cm^3 volume. There is Fat Layer (Subcutaneous Tissue) exposed. There is no tunneling or undermining noted. There is a medium amount of serosanguineous drainage noted. The wound margin is distinct with  the outline attached to the wound base. There is large (67-100%) red granulation within the wound bed. There is no necrotic tissue within the wound bed. Wound #68 status is Open. Original cause of wound was Gradually Appeared. The date acquired was: 03/23/2020. The wound has been in treatment 47 weeks. The wound is located on the Left,Proximal,Anterior Lower Leg. The wound measures 1cm length x 0.7cm width x 0.1cm depth; 0.55cm^2 area and 0.055cm^3 volume. There is Fat Layer (Subcutaneous Tissue) exposed. There is no tunneling noted. There is a medium amount of serosanguineous drainage noted. The wound margin is distinct with the outline attached to the wound base. There is large (67-100%) red granulation within the wound bed. There is no necrotic tissue within the wound bed. Wound #73 status is Open. Original cause of wound was Gradually Appeared. The date acquired was: 08/03/2020. The wound has been in treatment 26 weeks. The wound is located on the Right,Lateral Lower Leg. The wound measures 0.5cm length x 0.5cm width x 0.1cm depth; 0.196cm^2 area and 0.02cm^3 volume. There is Fat Layer (Subcutaneous Tissue) exposed. There is no tunneling or undermining noted. There is a medium amount of serosanguineous drainage noted. The wound margin is distinct with the outline attached to the wound base. There is large (67-100%) red granulation within the wound bed. There is no necrotic tissue within the wound bed. Wound #74 status is Open. Original cause of wound was Gradually Appeared. The date acquired was: 09/28/2020. The wound has been in treatment 20 weeks. The wound is located on the Left,Lateral Lower Leg. The wound measures 2.5cm length x 1.1cm width x 0.1cm depth; 2.16cm^2 area and 0.216cm^3  volume. There is Fat Layer (Subcutaneous Tissue) exposed. There is no tunneling or undermining noted. There is a medium amount of serosanguineous drainage noted. The wound margin is distinct with the outline attached to the wound base. There is large (67-100%) pink, friable granulation within the wound bed. There is a small (1-33%) amount of necrotic tissue within the wound bed including Adherent Slough. Wound #77 status is Open. Original cause of wound was Gradually Appeared. The date acquired was: 11/16/2020. The wound has been in treatment 13 weeks. The wound is located on the Left,Proximal,Lateral Lower Leg. The wound measures 2cm length x 2.1cm width x 0.1cm depth; 3.299cm^2 area and 0.33cm^3 volume. There is Fat Layer (Subcutaneous Tissue) exposed. There is no tunneling or undermining noted. There is a medium amount of serosanguineous drainage noted. The wound margin is distinct with the outline attached to the wound base. There is large (67-100%) red, friable granulation within the wound bed. There is a small (1-33%) amount of necrotic tissue within the wound bed including Adherent Slough. Wound #78 status is Open. Original cause of wound was Gradually Appeared. The date acquired was: 11/16/2020. The wound has been in treatment 13 weeks. The wound is located on the Right,Posterior Lower Leg. The wound measures 0.5cm length x 0.5cm width x 0.1cm depth; 0.196cm^2 area and 0.02cm^3 volume. There is Fat Layer (Subcutaneous Tissue) exposed. There is no tunneling or undermining noted. There is a medium amount of serosanguineous drainage noted. The wound margin is distinct with the outline attached to the wound base. There is large (67-100%) red granulation within the wound bed. There is no necrotic tissue within the wound bed. 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 No signs of infection today. We will continue with silver alginate under 4-layer compression. He can follow-up in 1 month or sooner if there are any issues. Home health will continue to change these wraps twice weekly. I asked that he attempt to take his shoes off on the days that home health comes so he can do the lymphedema pumps and they can reapply his shoes. He states he will try this. Procedures Wound #65 Pre-procedure diagnosis of Wound #65 is a Venous Leg Ulcer located on the Left,Distal,Anterior Lower Leg . There was a Four Layer Compression Therapy Procedure by Rhae Hammock, RN. Post procedure Diagnosis Wound #65: Same as Pre-Procedure Wound #68 Pre-procedure diagnosis of Wound #68 is a Lymphedema located on the Left,Proximal,Anterior Lower Leg . There was a Four Layer Compression Therapy Procedure by Rhae Hammock, RN. Post procedure Diagnosis Wound #68: Same as Pre-Procedure Wound #73 Pre-procedure diagnosis of Wound #73 is a Lymphedema located on the Right,Lateral Lower Leg . There was a Four Layer Compression Therapy Procedure by Rhae Hammock, RN. Post procedure Diagnosis Wound #73: Same as Pre-Procedure Wound #74 Pre-procedure diagnosis of Wound #74 is a Lymphedema located on the Left,Lateral Lower Leg . There was a Four Layer Compression Therapy Procedure by Rhae Hammock, RN. Post procedure Diagnosis Wound #74: Same as Pre-Procedure Wound #77 Pre-procedure diagnosis of Wound #77 is a Lymphedema located on the Left,Proximal,Lateral Lower Leg . There was a Four Layer Compression Therapy Procedure by Rhae Hammock, RN. Post procedure Diagnosis Wound #77: Same as Pre-Procedure Wound  #78 Pre-procedure diagnosis of Wound #78 is a Lymphedema located on the Right,Posterior Lower Leg . There was a Four Layer Compression Therapy Procedure by Rhae Hammock, RN. Post procedure Diagnosis Wound #78: Same as Pre-Procedure Plan Follow-up Appointments: Return appointment in 1 month. Bathing/ Shower/ Hygiene: May shower with protection but do not get wound dressing(s) wet. Edema Control - Lymphedema / SCD / Other: Lymphedema Pumps. Use Lymphedema pumps on leg(s) 2-3 times a day for 45-60 minutes. If wearing any wraps or hose, do not remove them. Continue exercising as instructed. Elevate legs to the level of the heart or above for 30 minutes daily and/or when sitting, a frequency of: Avoid standing for long periods of time. Exercise regularly Home Health: No change in wound care orders this week; continue Home Health for wound care. May utilize formulary equivalent dressing for wound treatment orders unless otherwise specified. - home health to change 2x a week {if possible Tuesday's and Friday's} Other Home Health Orders/Instructions: - [facility] WOUND #65: - Lower Leg Wound Laterality: Left, Anterior, Distal Cleanser: Normal Saline (Home Health) (Generic) 2 x Per Week/30 Days Discharge Instructions: Cleanse the wound with Normal Saline prior to applying a clean dressing using gauze sponges, not tissue or cotton balls. Peri-Wound Care: Triamcinolone 15 (g) 2 x Per Week/30 Days Discharge Instructions: Use triamcinolone 15 (g) as directed Peri-Wound Care: Sween Lotion (Moisturizing lotion) (Home Health) (Generic) 2 x Per Week/30 Days Discharge Instructions: Apply moisturizing lotion as directed Prim Dressing: KerraCel Ag Gelling Fiber Dressing, 4x5 in (silver alginate) (Home Health) (Generic) 2 x Per Week/30 Days ary Discharge Instructions: Apply silver alginate to wound bed as instructed Secondary Dressing: Woven Gauze Sponge, Non-Sterile 4x4 in (Home Health) (Generic) 2 x Per  Week/30 Days Discharge Instructions: Apply over primary dressing as  directed. Secondary Dressing: ABD Pad, 5x9 (Home Health) 2 x Per Week/30 Days Discharge Instructions: Apply over primary dressing as directed. Com pression Wrap: FourPress (4 layer compression wrap) (Home Health) (Generic) 2 x Per Week/30 Days Discharge Instructions: Apply four layer compression as directed. WOUND #68: - Lower Leg Wound Laterality: Left, Anterior, Proximal Cleanser: Normal Saline (Home Health) (Generic) 2 x Per Week/30 Days Discharge Instructions: Cleanse the wound with Normal Saline prior to applying a clean dressing using gauze sponges, not tissue or cotton balls. Peri-Wound Care: Triamcinolone 15 (g) 2 x Per Week/30 Days Discharge Instructions: Use triamcinolone 15 (g) as directed Peri-Wound Care: Sween Lotion (Moisturizing lotion) (Home Health) (Generic) 2 x Per Week/30 Days Discharge Instructions: Apply moisturizing lotion as directed Prim Dressing: KerraCel Ag Gelling Fiber Dressing, 4x5 in (silver alginate) (Home Health) (Generic) 2 x Per Week/30 Days ary Discharge Instructions: Apply silver alginate to wound bed as instructed Secondary Dressing: Woven Gauze Sponge, Non-Sterile 4x4 in (Home Health) (Generic) 2 x Per Week/30 Days Discharge Instructions: Apply over primary dressing as directed. Secondary Dressing: ABD Pad, 5x9 (Home Health) 2 x Per Week/30 Days Discharge Instructions: Apply over primary dressing as directed. Com pression Wrap: FourPress (4 layer compression wrap) (Home Health) (Generic) 2 x Per Week/30 Days Discharge Instructions: Apply four layer compression as directed. WOUND #73: - Lower Leg Wound Laterality: Right, Lateral Cleanser: Normal Saline (Home Health) (Generic) 2 x Per Week/30 Days Discharge Instructions: Cleanse the wound with Normal Saline prior to applying a clean dressing using gauze sponges, not tissue or cotton balls. Peri-Wound Care: Triamcinolone 15 (g) 2 x Per  Week/30 Days Discharge Instructions: Use triamcinolone 15 (g) as directed Peri-Wound Care: Sween Lotion (Moisturizing lotion) (Home Health) (Generic) 2 x Per Week/30 Days Discharge Instructions: Apply moisturizing lotion as directed Prim Dressing: KerraCel Ag Gelling Fiber Dressing, 4x5 in (silver alginate) (Home Health) (Generic) 2 x Per Week/30 Days ary Discharge Instructions: Apply silver alginate to wound bed as instructed Secondary Dressing: Woven Gauze Sponge, Non-Sterile 4x4 in (Home Health) (Generic) 2 x Per Week/30 Days Discharge Instructions: Apply over primary dressing as directed. Secondary Dressing: ABD Pad, 5x9 (Home Health) 2 x Per Week/30 Days Discharge Instructions: Apply over primary dressing as directed. Com pression Wrap: FourPress (4 layer compression wrap) (Home Health) (Generic) 2 x Per Week/30 Days Discharge Instructions: Apply four layer compression as directed. WOUND #74: - Lower Leg Wound Laterality: Left, Lateral Cleanser: Normal Saline (Home Health) (Generic) 2 x Per Week/30 Days Discharge Instructions: Cleanse the wound with Normal Saline prior to applying a clean dressing using gauze sponges, not tissue or cotton balls. Peri-Wound Care: Triamcinolone 15 (g) 2 x Per Week/30 Days Discharge Instructions: Use triamcinolone 15 (g) as directed Peri-Wound Care: Sween Lotion (Moisturizing lotion) (Home Health) (Generic) 2 x Per Week/30 Days Discharge Instructions: Apply moisturizing lotion as directed Prim Dressing: KerraCel Ag Gelling Fiber Dressing, 4x5 in (silver alginate) (Home Health) (Generic) 2 x Per Week/30 Days ary Discharge Instructions: Apply silver alginate to wound bed as instructed Secondary Dressing: Woven Gauze Sponge, Non-Sterile 4x4 in (Home Health) (Generic) 2 x Per Week/30 Days Discharge Instructions: Apply over primary dressing as directed. Secondary Dressing: ABD Pad, 5x9 (Home Health) 2 x Per Week/30 Days Discharge Instructions: Apply over  primary dressing as directed. Com pression Wrap: FourPress (4 layer compression wrap) (Home Health) (Generic) 2 x Per Week/30 Days Discharge Instructions: Apply four layer compression as directed. WOUND #77: - Lower Leg Wound Laterality: Left, Lateral, Proximal Cleanser: Normal Saline (Home Health) (  Generic) 2 x Per Week/30 Days Discharge Instructions: Cleanse the wound with Normal Saline prior to applying a clean dressing using gauze sponges, not tissue or cotton balls. Peri-Wound Care: Triamcinolone 15 (g) 2 x Per Week/30 Days Discharge Instructions: Use triamcinolone 15 (g) as directed Peri-Wound Care: Sween Lotion (Moisturizing lotion) (Home Health) (Generic) 2 x Per Week/30 Days Discharge Instructions: Apply moisturizing lotion as directed Prim Dressing: KerraCel Ag Gelling Fiber Dressing, 4x5 in (silver alginate) (Home Health) (Generic) 2 x Per Week/30 Days ary Discharge Instructions: Apply silver alginate to wound bed as instructed Secondary Dressing: Woven Gauze Sponge, Non-Sterile 4x4 in (Home Health) (Generic) 2 x Per Week/30 Days Discharge Instructions: Apply over primary dressing as directed. Secondary Dressing: ABD Pad, 5x9 (Home Health) 2 x Per Week/30 Days Discharge Instructions: Apply over primary dressing as directed. Com pression Wrap: FourPress (4 layer compression wrap) (Home Health) (Generic) 2 x Per Week/30 Days Discharge Instructions: Apply four layer compression as directed. WOUND #78: - Lower Leg Wound Laterality: Right, Posterior Cleanser: Normal Saline (Home Health) (Generic) 2 x Per Week/30 Days Discharge Instructions: Cleanse the wound with Normal Saline prior to applying a clean dressing using gauze sponges, not tissue or cotton balls. Peri-Wound Care: Triamcinolone 15 (g) 2 x Per Week/30 Days Discharge Instructions: Use triamcinolone 15 (g) as directed Peri-Wound Care: Sween Lotion (Moisturizing lotion) (Home Health) (Generic) 2 x Per Week/30 Days Discharge  Instructions: Apply moisturizing lotion as directed Prim Dressing: KerraCel Ag Gelling Fiber Dressing, 4x5 in (silver alginate) (Home Health) (Generic) 2 x Per Week/30 Days ary Discharge Instructions: Apply silver alginate to wound bed as instructed Secondary Dressing: Woven Gauze Sponge, Non-Sterile 4x4 in (Home Health) (Generic) 2 x Per Week/30 Days Discharge Instructions: Apply over primary dressing as directed. Secondary Dressing: ABD Pad, 5x9 (Home Health) 2 x Per Week/30 Days Discharge Instructions: Apply over primary dressing as directed. Com pression Wrap: FourPress (4 layer compression wrap) (Home Health) (Generic) 2 x Per Week/30 Days Discharge Instructions: Apply four layer compression as directed. 1. Silver alginate under 4-layer compression 2. Follow-up in 1 month Electronic Signature(s) Signed: 02/15/2021 2:07:51 PM By: Kalman Shan DO Entered By: Kalman Shan on 02/15/2021 14:07:26 -------------------------------------------------------------------------------- HxROS Details Patient Name: Date of Service: [name] [name] [name], [name] [name] RDIN W. 02/15/2021 12:30 PM Medical Record Number: [id number] Patient Account Number: [id number] Date of Birth/Sex: Treating RN: 08/13/1956 (65 y.o. [name] [name] [name], Lauren Primary Care Provider: [name] Other Clinician: Referring Provider: Treating Provider/Extender: Quentin Cornwall in Treatment: 82 Information Obtained From Patient Constitutional Symptoms (General Health) Medical History: Past Medical History Notes: h/o open leg wound , h/o CVA , TIA , varicose veins Eyes Medical History: Negative for: Cataracts; Glaucoma; Optic Neuritis Ear/Nose/Mouth/Throat Medical History: Negative for: Chronic sinus problems/congestion; Middle ear problems Hematologic/Lymphatic Medical History: Positive for: Lymphedema - bilateral lower legs Negative for: Anemia; Hemophilia; Human Immunodeficiency Virus Respiratory Medical  History: Positive for: Sleep Apnea - obstructive Negative for: Aspiration; Asthma; Chronic Obstructive Pulmonary Disease (COPD); Tuberculosis Past Medical History Notes: asbestosis Cardiovascular Medical History: Positive for: Congestive Heart Failure - diastolic; Hypertension; Peripheral Venous Disease Negative for: Angina; Arrhythmia; Coronary Artery Disease; Deep Vein Thrombosis; Hypotension; Myocardial Infarction; Peripheral Arterial Disease; Phlebitis; Vasculitis Gastrointestinal Medical History: Negative for: Cirrhosis ; Colitis; Crohns; Hepatitis A; Hepatitis B; Hepatitis C Endocrine Medical History: Negative for: Type I Diabetes; Type II Diabetes Genitourinary Medical History: Negative for: End Stage Renal Disease Past Medical History Notes: nephrolithiasis Immunological Medical History: Negative for: Lupus Erythematosus; Raynauds; Scleroderma Integumentary (Skin) Medical History:  Negative for: History of Burn Musculoskeletal Medical History: Positive for: Osteoarthritis Negative for: Gout; Rheumatoid Arthritis; Osteomyelitis Neurologic Medical History: Negative for: Dementia; Neuropathy; Quadriplegia; Paraplegia Oncologic Medical History: Negative for: Received Chemotherapy; Received Radiation Psychiatric Medical History: Negative for: Anorexia/bulimia Immunizations Pneumococcal Vaccine: Received Pneumococcal Vaccination: No Implantable Devices No devices added Hospitalization / Surgery History Type of Hospitalization/Surgery kidney stones stroke stroke/TIA Family and Social History Cancer: Yes - Mother; Diabetes: Yes - Paternal Grandparents; Heart Disease: Yes - Siblings; Hypertension: Yes - Mother,Father,Siblings; Stroke: Yes - Paternal Grandparents; Never smoker; Marital Status - Married; Alcohol Use: Never; Drug Use: No History; Caffeine Use: Daily - SODA; Financial Concerns: No; Food, Clothing or Shelter Needs: No; Support System Lacking: No;  Transportation Concerns: No Electronic Signature(s) Signed: 02/15/2021 2:07:51 PM By: Kalman Shan DO Signed: 02/15/2021 5:31:54 PM By: Rhae Hammock RN Entered By: Kalman Shan on 02/15/2021 14:04:26 -------------------------------------------------------------------------------- SuperBill Details Patient Name: Date of Service: [name] [name] [name], [name] [name] RDIN W. 02/15/2021 Medical Record Number: [id number] Patient Account Number: [id number] Date of Birth/Sex: Treating RN: Jul 18, 1956 (65 y.o. [name] [name] [name], Lauren Primary Care Provider: [name] Other Clinician: Referring Provider: Treating Provider/Extender: Quentin Cornwall in Treatment: 64 Diagnosis Coding ICD-10 Codes Code Description [id number] Chronic venous hypertension (idiopathic) with ulcer and inflammation of bilateral lower extremity I89.0 Lymphedema, not elsewhere classified L97.821 Non-pressure chronic ulcer of other part of left lower leg limited to breakdown of skin L97.811 Non-pressure chronic ulcer of other part of right lower leg limited to breakdown of skin Facility Procedures CPT4: Code [id number] 29 fo Description: 937 BILATERAL: Application of multi-layer venous compression system; leg (below knee), including ankle and ot. Modifier: Quantity: 1 Electronic Signature(s) Signed: 02/15/2021 2:07:51 PM By: Kalman Shan DO Signed: 02/15/2021 5:31:54 PM By: Rhae Hammock RN Entered By: Rhae Hammock on 02/15/2021 13:27:27

## 2021-02-15 NOTE — Progress Notes (Signed)
[name] [name] ([id number]) , Visit Report for 02/15/2021 Arrival Information Details Patient Name: Date of Service: [name] Ro RDIN W. 02/15/2021 12:30 PM Medical Record Number: [id number] Patient Account Number: [id number] Date of Birth/Sex: Treating RN: 09-30-1955 (65 y.o. [name], Lauren Primary Care [place] Kinyon: [name] Other Clinician: Referring Dorinda Stehr: Treating Abhay Godbolt/Extender: Quentin Cornwall in Treatment: 41 Visit Information History Since Last Visit Added or deleted any medications: No Patient Arrived: Ambulatory Any new allergies or adverse reactions: No Arrival Time: 12:44 Had a fall or experienced change in No Accompanied By: self activities of daily living that may affect Transfer Assistance: None risk of falls: Patient Identification Verified: Yes Signs or symptoms of abuse/neglect since last visito No Secondary Verification Process Completed: Yes Hospitalized since last visit: No Patient Requires Transmission-Based Precautions: No Implantable device outside of the clinic excluding No Patient Has Alerts: Yes cellular tissue based products placed in the center Patient Alerts: R ABI = 1.25 since last visit: L ABI = 1.27 Has Dressing in Place as Prescribed: Yes Pain Present Now: No Electronic Signature(s) Signed: 02/15/2021 5:24:49 PM By: Sandre Kitty Entered By: Sandre Kitty on 02/15/2021 12:45:27 -------------------------------------------------------------------------------- Compression Therapy Details Patient Name: Date of Service: [name] Ro RDIN W. 02/15/2021 12:30 PM Medical Record Number: [id number] Patient Account Number: [id number] Date of Birth/Sex: Treating RN: 21-Mar-1956 (65 y.o. [name], Lauren Primary Care Blanchie Zeleznik: [name] Other Clinician: Referring Haywood Meinders: Treating Niv Darley/Extender: Quentin Cornwall in Treatment: 55 Compression Therapy Performed for Wound Assessment: Wound  #65 Left,Distal,Anterior Lower Leg Performed By: Clinician Rhae Hammock, RN Compression Type: Four Layer Post Procedure Diagnosis Same as Pre-procedure Electronic Signature(s) Signed: 02/15/2021 5:31:54 PM By: Rhae Hammock RN Entered By: Rhae Hammock on 02/15/2021 13:23:41 -------------------------------------------------------------------------------- Compression Therapy Details Patient Name: Date of Service: [name] Ro RDIN W. 02/15/2021 12:30 PM Medical Record Number: [id number] Patient Account Number: [id number] Date of Birth/Sex: Treating RN: [birth year] (65 y.o. [name], Lauren Primary Care Mahdiya Mossberg: [name] Other Clinician: Referring Pedro Whiters: Treating Liylah Najarro/Extender: Quentin Cornwall in Treatment: 64 Compression Therapy Performed for Wound Assessment: Wound #68 Left,Proximal,Anterior Lower Leg Performed By: Clinician Rhae Hammock, RN Compression Type: Four Layer Post Procedure Diagnosis Same as Pre-procedure Electronic Signature(s) Signed: 02/15/2021 5:31:54 PM By: Rhae Hammock RN Entered By: Rhae Hammock on 02/15/2021 13:23:41 -------------------------------------------------------------------------------- Compression Therapy Details Patient Name: Date of Service: [name] Ro RDIN W. 02/15/2021 12:30 PM Medical Record Number: [id number] Patient Account Number: [id number] Date of Birth/Sex: Treating RN: 1955-10-19 (65 y.o. [name], Lauren Primary Care Dajaun Goldring: [name] Other Clinician: Referring Erendira Crabtree: Treating Dusten Ellinwood/Extender: Quentin Cornwall in Treatment: 91 Compression Therapy Performed for Wound Assessment: Wound #73 Right,Lateral Lower Leg Performed By: Clinician Rhae Hammock, RN Compression Type: Four Layer Post Procedure Diagnosis Same as Pre-procedure Electronic Signature(s) Signed: 02/15/2021 5:31:54 PM By: Rhae Hammock RN Entered By: Rhae Hammock on 02/15/2021 13:23:41 -------------------------------------------------------------------------------- Compression Therapy Details Patient Name: Date of Service: [name] Ro RDIN W. 02/15/2021 12:30 PM Medical Record Number: [id number] Patient Account Number: [id number] Date of Birth/Sex: Treating RN: 1955/09/25 (64 y.o. [name] Primary Care Maxton Noreen: [name] Other Clinician: Referring Kadisha Goodine: Treating Kirsten Mckone/Extender: Quentin Cornwall in Treatment: 59 Compression Therapy Performed for Wound Assessment: Wound #74 Left,Lateral Lower Leg Performed By: Clinician Rhae Hammock, RN Compression Type: Four Layer Post Procedure Diagnosis Same as Pre-procedure Electronic Signature(s) Signed: 02/15/2021 5:31:54 PM By: Rhae Hammock RN Signed: 02/15/2021 5:31:54 PM By: Rhae Hammock RN Entered  By: Rhae Hammock on 02/15/2021 13:23:41 -------------------------------------------------------------------------------- Compression Therapy Details Patient Name: Date of Service: [name] Ro RDIN W. 02/15/2021 12:30 PM Medical Record Number: [id number] Patient Account Number: [id number] Date of Birth/Sex: Treating RN: 1956/06/14 (65 y.o. [name], Lauren Primary Care Dereonna Lensing: [name] Other Clinician: Referring Farida Mcreynolds: Treating Hyde Sires/Extender: Quentin Cornwall in Treatment: 64 Compression Therapy Performed for Wound Assessment: Wound #77 Left,Proximal,Lateral Lower Leg Performed By: Clinician Rhae Hammock, RN Compression Type: Four Layer Post Procedure Diagnosis Same as Pre-procedure Electronic Signature(s) Signed: 02/15/2021 5:31:54 PM By: Rhae Hammock RN Entered By: Rhae Hammock on 02/15/2021 13:23:42 -------------------------------------------------------------------------------- Compression Therapy Details Patient Name: Date of Service: [name] Ro RDIN W. 02/15/2021 12:30  PM Medical Record Number: [id number] Patient Account Number: [id number] Date of Birth/Sex: Treating RN: 1956/01/10 (65 y.o. [name], Lauren Primary Care Rosalia Mcavoy: [name] Other Clinician: Referring Aleera Gilcrease: Treating Faraaz Wolin/Extender: Quentin Cornwall in Treatment: 64 Compression Therapy Performed for Wound Assessment: Wound #78 Right,Posterior Lower Leg Performed By: Clinician Rhae Hammock, RN Compression Type: Four Layer Post Procedure Diagnosis Same as Pre-procedure Electronic Signature(s) Signed: 02/15/2021 5:31:54 PM By: Rhae Hammock RN Entered By: Rhae Hammock on 02/15/2021 13:23:42 -------------------------------------------------------------------------------- Encounter Discharge Information Details Patient Name: Date of Service: [name] [name], [name] RDIN W. 02/15/2021 12:30 PM Medical Record Number: [id number] Patient Account Number: [id number] Date of Birth/Sex: Treating RN: 06-04-1956 (65 y.o. [name] Primary Care Kateleen Encarnacion: [name] Other Clinician: Referring Weber Monnier: Treating Christoph Copelan/Extender: Quentin Cornwall in Treatment: 69 Encounter Discharge Information Items Discharge Condition: Stable Ambulatory Status: [place] Discharge Destination: Home Transportation: Private Auto Schedule Follow-up Appointment: Yes Clinical Summary of Care: Provided on 02/15/2021 Form Type Recipient Paper Patient Patient Electronic Signature(s) Signed: 02/15/2021 2:06:46 PM By: [name] Entered By: [name] on 02/15/2021 14:06:46 -------------------------------------------------------------------------------- Lower Extremity Assessment Details Patient Name: Date of Service: [name] Ro RDIN W. 02/15/2021 12:30 PM Medical Record Number: [id number] Patient Account Number: [id number] Date of Birth/Sex: Treating RN: 05/08/1956 (65 y.o. [name] Primary Care Vineeth Fell: [name] Other  Clinician: Referring Meliah Appleman: Treating Aydia Maj/Extender: Quentin Cornwall in Treatment: 64 Edema Assessment Assessed: [name]: No] [name]: No] Edema: [Left: Yes] [Right: Yes] Calf Left: Right: Point of Measurement: 30 cm From Medial Instep 45 cm 47 cm Ankle Left: Right: Point of Measurement: 13 cm From Medial Instep 28 cm 28 cm Vascular Assessment Pulses: Dorsalis Pedis Palpable: [Left:Yes] [Right:Yes] Electronic Signature(s) Signed: 02/15/2021 5:56:30 PM By: Levan Hurst RN, BSN Entered By: Levan Hurst on 02/15/2021 13:14:33 -------------------------------------------------------------------------------- Multi Wound Chart Details Patient Name: Date of Service: [name] [name], [name] RDIN W. 02/15/2021 12:30 PM Medical Record Number: [id number] Patient Account Number: [id number] Date of Birth/Sex: Treating RN: May 21, 1956 (65 y.o. [name], Lauren Primary Care Lendell Gallick: [name] Other Clinician: Referring Creta Dorame: Treating Carah Barrientes/Extender: Quentin Cornwall in Treatment: 59 Vital Signs Height(in): 70 Pulse(bpm): 81 Weight(lbs): 360 Blood Pressure(mmHg): 165/80 Body Mass Index(BMI): 52 Temperature(F): 97.8 Respiratory Rate(breaths/min): 20 Photos: [65:No Photos Left, Distal, Anterior Lower Leg] [68:No Photos Left, Proximal, Anterior Lower Leg] [73:No Photos Right, Lateral Lower Leg] Wound Location: [65:Gradually Appeared] [68:Gradually Appeared] [73:Gradually Appeared] Wounding Event: [65:Venous Leg Ulcer] [68:Lymphedema] [73:Lymphedema] Primary Etiology: [65:Lymphedema] [68:N/A] [73:N/A] Secondary Etiology: [65:Lymphedema, Sleep Apnea,] [68:Lymphedema, Sleep Apnea,] [73:Lymphedema, Sleep Apnea,] Comorbid History: [65:Congestive Heart Failure, Hypertension, Peripheral Venous Disease, Osteoarthritis 09/08/2019] [68:Congestive Heart Failure, Hypertension, Peripheral Venous Disease, Osteoarthritis 03/23/2020] [73:Congestive Heart  Failure,  Hypertension, Peripheral Venous Disease, Osteoarthritis 08/03/2020] Date Acquired: [65:64] [68:47] [73:26] Weeks  of Treatment: [65:Open] [68:Open] [73:Open] Wound Status: [65:Yes] [68:Yes] [73:Yes] Clustered Wound: [65:1] [68:10] [73:4] Clustered Quantity: [65:0.6x0.6x0.1] [68:1x0.7x0.1] [73:0.5x0.5x0.1] Measurements L x W x D (cm) [65:0.283] [68:0.55] [73:0.196] A (cm) : rea [65:0.028] [68:0.055] [73:0.02] Volume (cm) : [65:100.00%] [68:69.70%] [73:99.70%] % Reduction in Area: [65:100.00%] [68:69.60%] [73:99.70%] % Reduction in Volume: [65:Full Thickness Without Exposed] [68:Full Thickness Without Exposed] [73:Full Thickness Without Exposed] Classification: [65:Support Structures Medium] [68:Support Structures Medium] [73:Support Structures Medium] Exudate Amount: [65:Serosanguineous] [68:Serosanguineous] [73:Serosanguineous] Exudate Type: [65:red, brown] [68:red, brown] [73:red, brown] Exudate Color: [65:Distinct, outline attached] [68:Distinct, outline attached] [73:Distinct, outline attached] Wound Margin: [65:Large (67-100%)] [68:Large (67-100%)] [73:Large (67-100%)] Granulation Amount: [65:Red] [68:Red] [73:Red] Granulation Quality: [65:None Present (0%)] [68:None Present (0%)] [73:None Present (0%)] Necrotic Amount: [65:Fat Layer (Subcutaneous Tissue): Yes Fat Layer (Subcutaneous Tissue): Yes Fat Layer (Subcutaneous Tissue): Yes] Exposed Structures: [65:Fascia: No Tendon: No Muscle: No Joint: No Bone: No Medium (34-66%)] [68:Fascia: No Tendon: No Muscle: No Joint: No Bone: No None] [73:Fascia: No Tendon: No Muscle: No Joint: No Bone: No Small (1-33%)] Epithelialization: [65:Compression Therapy] [68:Compression Therapy] [73:Compression [place] Wound Number: [id number] Photos: No Photos No Photos No Photos Left, Lateral Lower Leg Left, Proximal, Lateral Lower Leg Right, Posterior Lower Leg Wound Location: Gradually Appeared Gradually Appeared Gradually Appeared Wounding  Event: Lymphedema Lymphedema Lymphedema Primary Etiology: N/A N/A N/A Secondary Etiology: Lymphedema, Sleep Apnea, Lymphedema, Sleep Apnea, Lymphedema, Sleep Apnea, Comorbid History: Congestive Heart Failure, Congestive Heart Failure, Congestive Heart Failure, Hypertension, Peripheral Venous Hypertension, Peripheral Venous Hypertension, Peripheral Venous Disease, Osteoarthritis Disease, Osteoarthritis Disease, Osteoarthritis 09/28/2020 11/16/2020 11/16/2020 Date Acquired: 20 13 13  Weeks of Treatment: Open Open Open Wound Status: No No No Clustered Wound: N/A N/A N/A Clustered Quantity: 2.5x1.1x0.1 2x2.1x0.1 0.5x0.5x0.1 Measurements L x W x D (cm) 2.16 3.299 0.196 A (cm) : rea 0.216 0.33 0.02 Volume (cm) : -282.30% 7.70% 98.20% % Reduction in Area: -91.20% 7.60% 98.10% % Reduction in Volume: Full Thickness Without Exposed Full Thickness Without Exposed Full Thickness Without Exposed Classification: Support Structures Support Structures Support Structures Medium Medium Medium Exudate Amount: Serosanguineous Serosanguineous Serosanguineous Exudate Type: red, brown red, brown red, brown Exudate Color: Distinct, outline attached Distinct, outline attached Distinct, outline attached Wound Margin: Large (67-100%) Large (67-100%) Large (67-100%) Granulation Amount: Pink, Friable Red, Friable Red Granulation Quality: Small (1-33%) Small (1-33%) None Present (0%) Necrotic Amount: Fat Layer (Subcutaneous Tissue): Yes Fat Layer (Subcutaneous Tissue): Yes Fat Layer (Subcutaneous Tissue): Yes Exposed Structures: Fascia: No Fascia: No Fascia: No Tendon: No Tendon: No Tendon: No Muscle: No Muscle: No Muscle: No Joint: No Joint: No Joint: No Bone: No Bone: No Bone: No Small (1-33%) Small (1-33%) Large (67-100%) Epithelialization: Compression Therapy Compression Therapy Compression Therapy Procedures Performed: Treatment Notes Electronic Signature(s) Signed:  02/15/2021 2:07:51 PM By: Kalman Shan DO Signed: 02/15/2021 5:31:54 PM By: Rhae Hammock RN Entered By: Kalman Shan on 02/15/2021 14:02:16 -------------------------------------------------------------------------------- [place] Details Patient Name: Date of Service: [name] [name], [name] RDIN W. 02/15/2021 12:30 PM Medical Record Number: [id number] Patient Account Number: [id number] Date of Birth/Sex: Treating RN: 09-21-1955 (65 y.o. [name], Lauren Primary Care Nanda Bittick: [name] Other Clinician: Referring Maddilyn Campus: Treating Flynt Breeze/Extender: Quentin Cornwall in Treatment: 24 Active Inactive Venous Leg Ulcer Nursing Diagnoses: Knowledge deficit related to disease process and management Potential for venous Insuffiency (use before diagnosis confirmed) Goals: Patient will maintain optimal edema control Date Initiated: 01/11/2021 Target Resolution Date: 03/19/2021 Goal Status: Active Interventions: Assess peripheral edema status every visit. Compression as ordered Treatment Activities: Therapeutic  compression applied : 01/11/2021 Notes: Wound/Skin Impairment Nursing Diagnoses: Knowledge deficit related to ulceration/compromised skin integrity Goals: Patient/caregiver will verbalize understanding of skin care regimen Date Initiated: 11/25/2019 Target Resolution Date: 03/19/2021 Goal Status: Active Ulcer/skin breakdown will have a volume reduction of 30% by week 4 Date Initiated: 11/25/2019 Date Inactivated: 01/06/2020 Target Resolution Date: 12/26/2019 Goal Status: Met Ulcer/skin breakdown will have a volume reduction of 50% by week 8 Date Initiated: 01/06/2020 Date Inactivated: 02/17/2020 Target Resolution Date: 02/05/2020 Goal Status: Met Interventions: Assess patient/caregiver ability to obtain necessary supplies Assess patient/caregiver ability to perform ulcer/skin care regimen upon admission and as needed Assess ulceration(s)  every visit Notes: Electronic Signature(s) Signed: 02/15/2021 5:31:54 PM By: Rhae Hammock RN Entered By: Rhae Hammock on 02/15/2021 13:25:32 -------------------------------------------------------------------------------- Pain Assessment Details Patient Name: Date of Service: [name] Ro RDIN W. 02/15/2021 12:30 PM Medical Record Number: [id number] Patient Account Number: [id number] Date of Birth/Sex: Treating RN: [birth year] (65 y.o. [name], Lauren Primary Care Dejanay Wamboldt: [name] Other Clinician: Referring Ellinore Merced: Treating Kimm Ungaro/Extender: Quentin Cornwall in Treatment: 32 Active Problems Location of Pain Severity and Description of Pain Patient Has Paino No Site Locations Pain Management and Medication Current Pain Management: Electronic Signature(s) Signed: 02/15/2021 5:24:49 PM By: Sandre Kitty Signed: 02/15/2021 5:31:54 PM By: Rhae Hammock RN Entered By: Sandre Kitty on 02/15/2021 12:45:52 -------------------------------------------------------------------------------- Patient/Caregiver Education Details Patient Name: Date of Service: [name] 5/31/2022andnbsp12:30 PM Medical Record Number: [id number] Patient Account Number: [id number] Date of Birth/Gender: Treating RN: 04/20/1956 (65 y.o. [name] Primary Care Physician: [name] Other Clinician: Referring Physician: Treating Physician/Extender: Quentin Cornwall in Treatment: 27 Education Assessment Education Provided To: Patient Education Topics Provided Wound/Skin Impairment: Methods: Explain/Verbal Responses: State content correctly [employer]) Signed: 02/15/2021 5:31:54 PM By: Rhae Hammock RN Entered By: Rhae Hammock on 02/15/2021 13:25:52 -------------------------------------------------------------------------------- Wound Assessment Details Patient Name: Date of Service: [name] Ro  RDIN W. 02/15/2021 12:30 PM Medical Record Number: [id number] Patient Account Number: [id number] Date of Birth/Sex: Treating RN: 02-Feb-1956 (65 y.o. [name], Lauren Primary Care Kaari Zeigler: [name] Other Clinician: Referring Witney Huie: Treating Daysy Santini/Extender: Quentin Cornwall in Treatment: 64 Wound Status Wound Number: 65 Primary Venous Leg Ulcer Etiology: Wound Location: Left, Distal, Anterior Lower Leg Secondary Lymphedema Wounding Event: Gradually Appeared Etiology: Date Acquired: 09/08/2019 Wound Open Weeks Of Treatment: 64 Status: Clustered Wound: Yes Comorbid Lymphedema, Sleep Apnea, Congestive Heart Failure, History: Hypertension, Peripheral Venous Disease, Osteoarthritis Photos Wound Measurements Length: (cm) 0.6 Width: (cm) 0.6 Depth: (cm) 0.1 Clustered Quantity: 1 Area: (cm) 0.283 Volume: (cm) 0.028 % Reduction in Area: 100% % Reduction in Volume: 100% Epithelialization: Medium (34-66%) Tunneling: No Undermining: No Wound Description Classification: Full Thickness Without Exposed Support Structures Wound Margin: Distinct, outline attached Exudate Amount: Medium Exudate Type: Serosanguineous Exudate Color: red, brown Foul Odor After Cleansing: No Slough/Fibrino No Wound Bed Granulation Amount: Large (67-100%) Exposed Structure Granulation Quality: Red Fascia Exposed: No Necrotic Amount: None Present (0%) Fat Layer (Subcutaneous Tissue) Exposed: Yes Tendon Exposed: No Muscle Exposed: No Joint Exposed: No Bone Exposed: No Treatment Notes Wound #65 (Lower Leg) Wound Laterality: Left, Anterior, Distal Cleanser Normal Saline Discharge Instruction: Cleanse the wound with Normal Saline prior to applying a clean dressing using gauze sponges, not tissue or cotton balls. Peri-Wound Care Triamcinolone 15 (g) Discharge Instruction: Use triamcinolone 15 (g) as directed Sween Lotion (Moisturizing lotion) Discharge Instruction:  Apply moisturizing lotion as directed Topical Primary Dressing KerraCel Ag Gelling Fiber Dressing,  4x5 in (silver alginate) Discharge Instruction: Apply silver alginate to wound bed as instructed Secondary Dressing Woven Gauze Sponge, Non-Sterile 4x4 in Discharge Instruction: Apply over primary dressing as directed. ABD Pad, 5x9 Discharge Instruction: Apply over primary dressing as directed. Secured With Compression Wrap FourPress (4 layer compression wrap) Discharge Instruction: Apply four layer compression as directed. Compression Stockings Add-Ons Electronic Signature(s) Signed: 02/15/2021 5:24:49 PM By: Sandre Kitty Signed: 02/15/2021 5:31:54 PM By: Rhae Hammock RN Entered By: Sandre Kitty on 02/15/2021 16:57:18 -------------------------------------------------------------------------------- Wound Assessment Details Patient Name: Date of Service: [name] [name], [name] RDIN W. 02/15/2021 12:30 PM Medical Record Number: [id number] Patient Account Number: [id number] Date of Birth/Sex: Treating RN: 1956-09-05 (65 y.o. [name], Lauren Primary Care Liyanna Cartwright: [name] Other Clinician: Referring Esterlene Atiyeh: Treating Bao Bazen/Extender: Quentin Cornwall in Treatment: 64 Wound Status Wound Number: 68 Primary Lymphedema Etiology: Wound Location: Left, Proximal, Anterior Lower Leg Wound Open Wounding Event: Gradually Appeared Status: Date Acquired: 03/23/2020 Comorbid Lymphedema, Sleep Apnea, Congestive Heart Failure, Weeks Of Treatment: 47 History: Hypertension, Peripheral Venous Disease, Osteoarthritis Clustered Wound: Yes Photos Wound Measurements Length: (cm) 1 Width: (cm) 0.7 Depth: (cm) 0.1 Clustered Quantity: 10 Area: (cm) 0.55 Volume: (cm) 0.055 % Reduction in Area: 69.7% % Reduction in Volume: 69.6% Epithelialization: None Tunneling: No Wound Description Classification: Full Thickness Without Exposed Support Structures Wound Margin:  Distinct, outline attached Exudate Amount: Medium Exudate Type: Serosanguineous Exudate Color: red, brown Foul Odor After Cleansing: No Slough/Fibrino No Wound Bed Granulation Amount: Large (67-100%) Exposed Structure Granulation Quality: Red Fascia Exposed: No Necrotic Amount: None Present (0%) Fat Layer (Subcutaneous Tissue) Exposed: Yes Tendon Exposed: No Muscle Exposed: No Joint Exposed: No Bone Exposed: No Treatment Notes Wound #68 (Lower Leg) Wound Laterality: Left, Anterior, Proximal Cleanser Normal Saline Discharge Instruction: Cleanse the wound with Normal Saline prior to applying a clean dressing using gauze sponges, not tissue or cotton balls. Peri-Wound Care Triamcinolone 15 (g) Discharge Instruction: Use triamcinolone 15 (g) as directed Sween Lotion (Moisturizing lotion) Discharge Instruction: Apply moisturizing lotion as directed Topical Primary Dressing KerraCel Ag Gelling Fiber Dressing, 4x5 in (silver alginate) Discharge Instruction: Apply silver alginate to wound bed as instructed Secondary Dressing Woven Gauze Sponge, Non-Sterile 4x4 in Discharge Instruction: Apply over primary dressing as directed. ABD Pad, 5x9 Discharge Instruction: Apply over primary dressing as directed. Secured With Compression Wrap FourPress (4 layer compression wrap) Discharge Instruction: Apply four layer compression as directed. Compression Stockings Add-Ons Electronic Signature(s) Signed: 02/15/2021 5:24:49 PM By: Sandre Kitty Signed: 02/15/2021 5:31:54 PM By: Rhae Hammock RN Entered By: Sandre Kitty on 02/15/2021 16:56:45 -------------------------------------------------------------------------------- Wound Assessment Details Patient Name: Date of Service: [name] [name], [name] RDIN W. 02/15/2021 12:30 PM Medical Record Number: [id number] Patient Account Number: [id number] Date of Birth/Sex: Treating RN: 10/05/1955 (65 y.o. [name], Lauren Primary Care Zhion Pevehouse:  [name] Other Clinician: Referring Arika Mainer: Treating Jaquaya Coyle/Extender: Quentin Cornwall in Treatment: 64 Wound Status Wound Number: 73 Primary Lymphedema Etiology: Wound Location: Right, Lateral Lower Leg Wound Open Wounding Event: Gradually Appeared Status: Date Acquired: 08/03/2020 Comorbid Lymphedema, Sleep Apnea, Congestive Heart Failure, Weeks Of Treatment: 26 History: Hypertension, Peripheral Venous Disease, Osteoarthritis Clustered Wound: Yes Photos Wound Measurements Length: (cm) 0.5 Width: (cm) 0.5 Depth: (cm) 0.1 Clustered Quantity: 4 Area: (cm) 0.196 Volume: (cm) 0.02 % Reduction in Area: 99.7% % Reduction in Volume: 99.7% Epithelialization: Small (1-33%) Tunneling: No Undermining: No Wound Description Classification: Full Thickness Without Exposed Support Structures Wound Margin: Distinct, outline attached Exudate Amount: Medium Exudate Type: Serosanguineous  Exudate Color: red, brown Foul Odor After Cleansing: No Slough/Fibrino No Wound Bed Granulation Amount: Large (67-100%) Exposed Structure Granulation Quality: Red Fascia Exposed: No Necrotic Amount: None Present (0%) Fat Layer (Subcutaneous Tissue) Exposed: Yes Tendon Exposed: No Muscle Exposed: No Joint Exposed: No Bone Exposed: No Treatment Notes Wound #73 (Lower Leg) Wound Laterality: Right, Lateral Cleanser Normal Saline Discharge Instruction: Cleanse the wound with Normal Saline prior to applying a clean dressing using gauze sponges, not tissue or cotton balls. Peri-Wound Care Triamcinolone 15 (g) Discharge Instruction: Use triamcinolone 15 (g) as directed Sween Lotion (Moisturizing lotion) Discharge Instruction: Apply moisturizing lotion as directed Topical Primary Dressing KerraCel Ag Gelling Fiber Dressing, 4x5 in (silver alginate) Discharge Instruction: Apply silver alginate to wound bed as instructed Secondary Dressing Woven Gauze Sponge, Non-Sterile  4x4 in Discharge Instruction: Apply over primary dressing as directed. ABD Pad, 5x9 Discharge Instruction: Apply over primary dressing as directed. Secured With Compression Wrap FourPress (4 layer compression wrap) Discharge Instruction: Apply four layer compression as directed. Compression Stockings Add-Ons Electronic Signature(s) Signed: 02/15/2021 5:24:49 PM By: Sandre Kitty Signed: 02/15/2021 5:31:54 PM By: Rhae Hammock RN Entered By: Sandre Kitty on 02/15/2021 16:55:23 -------------------------------------------------------------------------------- Wound Assessment Details Patient Name: Date of Service: [name] [name], [name] RDIN W. 02/15/2021 12:30 PM Medical Record Number: [id number] Patient Account Number: [id number] Date of Birth/Sex: Treating RN: 1956/08/19 (65 y.o. [name], Lauren Primary Care Izaiah Tabb: [name] Other Clinician: Referring Karyl Sharrar: Treating Xyla Leisner/Extender: Quentin Cornwall in Treatment: 64 Wound Status Wound Number: 74 Primary Lymphedema Etiology: Wound Location: Left, Lateral Lower Leg Wound Open Wounding Event: Gradually Appeared Status: Date Acquired: 09/28/2020 Comorbid Lymphedema, Sleep Apnea, Congestive Heart Failure, Weeks Of Treatment: 20 History: Hypertension, Peripheral Venous Disease, Osteoarthritis Clustered Wound: No Photos Wound Measurements Length: (cm) 2.5 Width: (cm) 1.1 Depth: (cm) 0.1 Area: (cm) 2.16 Volume: (cm) 0.216 % Reduction in Area: -282.3% % Reduction in Volume: -91.2% Epithelialization: Small (1-33%) Tunneling: No Undermining: No Wound Description Classification: Full Thickness Without Exposed Support Structures Wound Margin: Distinct, outline attached Exudate Amount: Medium Exudate Type: Serosanguineous Exudate Color: red, brown Foul Odor After Cleansing: No Slough/Fibrino Yes Wound Bed Granulation Amount: Large (67-100%) Exposed Structure Granulation Quality: Pink,  Friable Fascia Exposed: No Necrotic Amount: Small (1-33%) Fat Layer (Subcutaneous Tissue) Exposed: Yes Necrotic Quality: Adherent Slough Tendon Exposed: No Muscle Exposed: No Joint Exposed: No Bone Exposed: No Treatment Notes Wound #74 (Lower Leg) Wound Laterality: Left, Lateral Cleanser Normal Saline Discharge Instruction: Cleanse the wound with Normal Saline prior to applying a clean dressing using gauze sponges, not tissue or cotton balls. Peri-Wound Care Triamcinolone 15 (g) Discharge Instruction: Use triamcinolone 15 (g) as directed Sween Lotion (Moisturizing lotion) Discharge Instruction: Apply moisturizing lotion as directed Topical Primary Dressing KerraCel Ag Gelling Fiber Dressing, 4x5 in (silver alginate) Discharge Instruction: Apply silver alginate to wound bed as instructed Secondary Dressing Woven Gauze Sponge, Non-Sterile 4x4 in Discharge Instruction: Apply over primary dressing as directed. ABD Pad, 5x9 Discharge Instruction: Apply over primary dressing as directed. Secured With Compression Wrap FourPress (4 layer compression wrap) Discharge Instruction: Apply four layer compression as directed. Compression Stockings Add-Ons Electronic Signature(s) Signed: 02/15/2021 5:24:49 PM By: Sandre Kitty Signed: 02/15/2021 5:31:54 PM By: Rhae Hammock RN Entered By: Sandre Kitty on 02/15/2021 16:57:45 -------------------------------------------------------------------------------- Wound Assessment Details Patient Name: Date of Service: [name] [name], [name] RDIN W. 02/15/2021 12:30 PM Medical Record Number: [id number] Patient Account Number: [id number] Date of Birth/Sex: Treating RN: 09/04/1956 (65 y.o. [name] Primary Care Farooq Petrovich: Quintin Alto,  Eddie Dibbles Other Clinician: Referring Ames Hoban: Treating Nithila Sumners/Extender: Quentin Cornwall in Treatment: 56 Wound Status Wound Number: 77 Primary Lymphedema Etiology: Wound Location: Left,  Proximal, Lateral Lower Leg Wound Open Wounding Event: Gradually Appeared Status: Date Acquired: 11/16/2020 Comorbid Lymphedema, Sleep Apnea, Congestive Heart Failure, Weeks Of Treatment: 13 History: Hypertension, Peripheral Venous Disease, Osteoarthritis Clustered Wound: No Photos Wound Measurements Length: (cm) 2 Width: (cm) 2.1 Depth: (cm) 0.1 Area: (cm) 3.299 Volume: (cm) 0.33 % Reduction in Area: 7.7% % Reduction in Volume: 7.6% Epithelialization: Small (1-33%) Tunneling: No Undermining: No Wound Description Classification: Full Thickness Without Exposed Support Structures Wound Margin: Distinct, outline attached Exudate Amount: Medium Exudate Type: Serosanguineous Exudate Color: red, brown Foul Odor After Cleansing: No Slough/Fibrino Yes Wound Bed Granulation Amount: Large (67-100%) Exposed Structure Granulation Quality: Red, Friable Fascia Exposed: No Necrotic Amount: Small (1-33%) Fat Layer (Subcutaneous Tissue) Exposed: Yes Necrotic Quality: Adherent Slough Tendon Exposed: No Muscle Exposed: No Joint Exposed: No Bone Exposed: No Treatment Notes Wound #77 (Lower Leg) Wound Laterality: Left, Lateral, Proximal Cleanser Normal Saline Discharge Instruction: Cleanse the wound with Normal Saline prior to applying a clean dressing using gauze sponges, not tissue or cotton balls. Peri-Wound Care Triamcinolone 15 (g) Discharge Instruction: Use triamcinolone 15 (g) as directed Sween Lotion (Moisturizing lotion) Discharge Instruction: Apply moisturizing lotion as directed Topical Primary Dressing KerraCel Ag Gelling Fiber Dressing, 4x5 in (silver alginate) Discharge Instruction: Apply silver alginate to wound bed as instructed Secondary Dressing Woven Gauze Sponge, Non-Sterile 4x4 in Discharge Instruction: Apply over primary dressing as directed. ABD Pad, 5x9 Discharge Instruction: Apply over primary dressing as directed. Secured With Compression  Wrap FourPress (4 layer compression wrap) Discharge Instruction: Apply four layer compression as directed. Compression Stockings Add-Ons Electronic Signature(s) Signed: 02/15/2021 5:24:49 PM By: Sandre Kitty Signed: 02/15/2021 5:31:54 PM By: Rhae Hammock RN Entered By: Sandre Kitty on 02/15/2021 16:58:17 -------------------------------------------------------------------------------- Wound Assessment Details Patient Name: Date of Service: [name] [name], [name] RDIN W. 02/15/2021 12:30 PM Medical Record Number: [id number] Patient Account Number: [id number] Date of Birth/Sex: Treating RN: 1956-07-14 (65 y.o. [name], Lauren Primary Care Ben Habermann: [name] Other Clinician: Referring Dewana Ammirati: Treating Keli Buehner/Extender: Quentin Cornwall in Treatment: 64 Wound Status Wound Number: 78 Primary Lymphedema Etiology: Wound Location: Right, Posterior Lower Leg Wound Open Wounding Event: Gradually Appeared Status: Date Acquired: 11/16/2020 Comorbid Lymphedema, Sleep Apnea, Congestive Heart Failure, Weeks Of Treatment: 13 History: Hypertension, Peripheral Venous Disease, Osteoarthritis Clustered Wound: No Photos Wound Measurements Length: (cm) 0.5 Width: (cm) 0.5 Depth: (cm) 0.1 Area: (cm) 0.196 Volume: (cm) 0.02 % Reduction in Area: 98.2% % Reduction in Volume: 98.1% Epithelialization: Large (67-100%) Tunneling: No Undermining: No Wound Description Classification: Full Thickness Without Exposed Support Structures Wound Margin: Distinct, outline attached Exudate Amount: Medium Exudate Type: Serosanguineous Exudate Color: red, brown Foul Odor After Cleansing: No Slough/Fibrino No Wound Bed Granulation Amount: Large (67-100%) Exposed Structure Granulation Quality: Red Fascia Exposed: No Necrotic Amount: None Present (0%) Fat Layer (Subcutaneous Tissue) Exposed: Yes Tendon Exposed: No Muscle Exposed: No Joint Exposed: No Bone Exposed:  No Treatment Notes Wound #78 (Lower Leg) Wound Laterality: Right, Posterior Cleanser Normal Saline Discharge Instruction: Cleanse the wound with Normal Saline prior to applying a clean dressing using gauze sponges, not tissue or cotton balls. Peri-Wound Care Triamcinolone 15 (g) Discharge Instruction: Use triamcinolone 15 (g) as directed Sween Lotion (Moisturizing lotion) Discharge Instruction: Apply moisturizing lotion as directed Topical Primary Dressing KerraCel Ag Gelling Fiber Dressing, 4x5 in (silver alginate) Discharge Instruction: Apply silver  alginate to wound bed as instructed Secondary Dressing Woven Gauze Sponge, Non-Sterile 4x4 in Discharge Instruction: Apply over primary dressing as directed. ABD Pad, 5x9 Discharge Instruction: Apply over primary dressing as directed. Secured With Compression Wrap FourPress (4 layer compression wrap) Discharge Instruction: Apply four layer compression as directed. Compression Stockings Add-Ons Electronic Signature(s) Signed: 02/15/2021 5:24:49 PM By: Sandre Kitty Signed: 02/15/2021 5:31:54 PM By: Rhae Hammock RN Entered By: Sandre Kitty on 02/15/2021 16:56:03 -------------------------------------------------------------------------------- Vitals Details Patient Name: Date of Service: [name] [name], [name] RDIN W. 02/15/2021 12:30 PM Medical Record Number: [id number] Patient Account Number: [id number] Date of Birth/Sex: Treating RN: 12-30-1955 (65 y.o. [name], Lauren Primary Care Jermine Bibbee: [name] Other Clinician: Referring Olga Seyler: Treating Kindra Bickham/Extender: Quentin Cornwall in Treatment: 7 Vital Signs Time Taken: 12:45 Temperature (F): 97.8 Height (in): 70 Pulse (bpm): 71 Weight (lbs): 360 Respiratory Rate (breaths/min): 20 Body Mass Index (BMI): 51.6 Blood Pressure (mmHg): 165/80 Reference Range: 80 - 120 mg / dl Electronic Signature(s) Signed: 02/15/2021 5:24:49 PM By: Sandre Kitty Entered By: Sandre Kitty on 02/15/2021 12:45:43

## 2021-02-19 DIAGNOSIS — I87333 Chronic venous hypertension (idiopathic) with ulcer and inflammation of bilateral lower extremity: Secondary | ICD-10-CM | POA: Diagnosis not present

## 2021-02-19 DIAGNOSIS — I110 Hypertensive heart disease with heart failure: Secondary | ICD-10-CM | POA: Diagnosis not present

## 2021-02-19 DIAGNOSIS — I5032 Chronic diastolic (congestive) heart failure: Secondary | ICD-10-CM | POA: Diagnosis not present

## 2021-02-19 DIAGNOSIS — G4733 Obstructive sleep apnea (adult) (pediatric): Secondary | ICD-10-CM | POA: Diagnosis not present

## 2021-02-19 DIAGNOSIS — L97821 Non-pressure chronic ulcer of other part of left lower leg limited to breakdown of skin: Secondary | ICD-10-CM | POA: Diagnosis not present

## 2021-02-19 DIAGNOSIS — L97811 Non-pressure chronic ulcer of other part of right lower leg limited to breakdown of skin: Secondary | ICD-10-CM | POA: Diagnosis not present

## 2021-02-22 DIAGNOSIS — I5032 Chronic diastolic (congestive) heart failure: Secondary | ICD-10-CM | POA: Diagnosis not present

## 2021-02-22 DIAGNOSIS — L97811 Non-pressure chronic ulcer of other part of right lower leg limited to breakdown of skin: Secondary | ICD-10-CM | POA: Diagnosis not present

## 2021-02-22 DIAGNOSIS — I87333 Chronic venous hypertension (idiopathic) with ulcer and inflammation of bilateral lower extremity: Secondary | ICD-10-CM | POA: Diagnosis not present

## 2021-02-22 DIAGNOSIS — G4733 Obstructive sleep apnea (adult) (pediatric): Secondary | ICD-10-CM | POA: Diagnosis not present

## 2021-02-22 DIAGNOSIS — L97821 Non-pressure chronic ulcer of other part of left lower leg limited to breakdown of skin: Secondary | ICD-10-CM | POA: Diagnosis not present

## 2021-02-22 DIAGNOSIS — I110 Hypertensive heart disease with heart failure: Secondary | ICD-10-CM | POA: Diagnosis not present

## 2021-02-26 DIAGNOSIS — L97821 Non-pressure chronic ulcer of other part of left lower leg limited to breakdown of skin: Secondary | ICD-10-CM | POA: Diagnosis not present

## 2021-02-26 DIAGNOSIS — L97811 Non-pressure chronic ulcer of other part of right lower leg limited to breakdown of skin: Secondary | ICD-10-CM | POA: Diagnosis not present

## 2021-02-26 DIAGNOSIS — I5032 Chronic diastolic (congestive) heart failure: Secondary | ICD-10-CM | POA: Diagnosis not present

## 2021-02-26 DIAGNOSIS — G4733 Obstructive sleep apnea (adult) (pediatric): Secondary | ICD-10-CM | POA: Diagnosis not present

## 2021-02-26 DIAGNOSIS — I87333 Chronic venous hypertension (idiopathic) with ulcer and inflammation of bilateral lower extremity: Secondary | ICD-10-CM | POA: Diagnosis not present

## 2021-02-26 DIAGNOSIS — I110 Hypertensive heart disease with heart failure: Secondary | ICD-10-CM | POA: Diagnosis not present

## 2021-03-02 DIAGNOSIS — L97811 Non-pressure chronic ulcer of other part of right lower leg limited to breakdown of skin: Secondary | ICD-10-CM | POA: Diagnosis not present

## 2021-03-02 DIAGNOSIS — I87333 Chronic venous hypertension (idiopathic) with ulcer and inflammation of bilateral lower extremity: Secondary | ICD-10-CM | POA: Diagnosis not present

## 2021-03-02 DIAGNOSIS — I110 Hypertensive heart disease with heart failure: Secondary | ICD-10-CM | POA: Diagnosis not present

## 2021-03-02 DIAGNOSIS — G4733 Obstructive sleep apnea (adult) (pediatric): Secondary | ICD-10-CM | POA: Diagnosis not present

## 2021-03-02 DIAGNOSIS — I5032 Chronic diastolic (congestive) heart failure: Secondary | ICD-10-CM | POA: Diagnosis not present

## 2021-03-02 DIAGNOSIS — L97821 Non-pressure chronic ulcer of other part of left lower leg limited to breakdown of skin: Secondary | ICD-10-CM | POA: Diagnosis not present

## 2021-03-05 DIAGNOSIS — G4733 Obstructive sleep apnea (adult) (pediatric): Secondary | ICD-10-CM | POA: Diagnosis not present

## 2021-03-05 DIAGNOSIS — L97821 Non-pressure chronic ulcer of other part of left lower leg limited to breakdown of skin: Secondary | ICD-10-CM | POA: Diagnosis not present

## 2021-03-05 DIAGNOSIS — L97811 Non-pressure chronic ulcer of other part of right lower leg limited to breakdown of skin: Secondary | ICD-10-CM | POA: Diagnosis not present

## 2021-03-05 DIAGNOSIS — I5032 Chronic diastolic (congestive) heart failure: Secondary | ICD-10-CM | POA: Diagnosis not present

## 2021-03-05 DIAGNOSIS — I87333 Chronic venous hypertension (idiopathic) with ulcer and inflammation of bilateral lower extremity: Secondary | ICD-10-CM | POA: Diagnosis not present

## 2021-03-05 DIAGNOSIS — I110 Hypertensive heart disease with heart failure: Secondary | ICD-10-CM | POA: Diagnosis not present

## 2021-03-08 DIAGNOSIS — I110 Hypertensive heart disease with heart failure: Secondary | ICD-10-CM | POA: Diagnosis not present

## 2021-03-08 DIAGNOSIS — L97811 Non-pressure chronic ulcer of other part of right lower leg limited to breakdown of skin: Secondary | ICD-10-CM | POA: Diagnosis not present

## 2021-03-08 DIAGNOSIS — I5032 Chronic diastolic (congestive) heart failure: Secondary | ICD-10-CM | POA: Diagnosis not present

## 2021-03-08 DIAGNOSIS — G4733 Obstructive sleep apnea (adult) (pediatric): Secondary | ICD-10-CM | POA: Diagnosis not present

## 2021-03-08 DIAGNOSIS — I87333 Chronic venous hypertension (idiopathic) with ulcer and inflammation of bilateral lower extremity: Secondary | ICD-10-CM | POA: Diagnosis not present

## 2021-03-08 DIAGNOSIS — L97821 Non-pressure chronic ulcer of other part of left lower leg limited to breakdown of skin: Secondary | ICD-10-CM | POA: Diagnosis not present

## 2021-03-12 DIAGNOSIS — I5032 Chronic diastolic (congestive) heart failure: Secondary | ICD-10-CM | POA: Diagnosis not present

## 2021-03-12 DIAGNOSIS — L97821 Non-pressure chronic ulcer of other part of left lower leg limited to breakdown of skin: Secondary | ICD-10-CM | POA: Diagnosis not present

## 2021-03-12 DIAGNOSIS — G4733 Obstructive sleep apnea (adult) (pediatric): Secondary | ICD-10-CM | POA: Diagnosis not present

## 2021-03-12 DIAGNOSIS — I87333 Chronic venous hypertension (idiopathic) with ulcer and inflammation of bilateral lower extremity: Secondary | ICD-10-CM | POA: Diagnosis not present

## 2021-03-12 DIAGNOSIS — I110 Hypertensive heart disease with heart failure: Secondary | ICD-10-CM | POA: Diagnosis not present

## 2021-03-12 DIAGNOSIS — L97811 Non-pressure chronic ulcer of other part of right lower leg limited to breakdown of skin: Secondary | ICD-10-CM | POA: Diagnosis not present

## 2021-03-13 DIAGNOSIS — Z7902 Long term (current) use of antithrombotics/antiplatelets: Secondary | ICD-10-CM | POA: Diagnosis not present

## 2021-03-13 DIAGNOSIS — I5032 Chronic diastolic (congestive) heart failure: Secondary | ICD-10-CM | POA: Diagnosis not present

## 2021-03-13 DIAGNOSIS — E669 Obesity, unspecified: Secondary | ICD-10-CM | POA: Diagnosis not present

## 2021-03-13 DIAGNOSIS — I110 Hypertensive heart disease with heart failure: Secondary | ICD-10-CM | POA: Diagnosis not present

## 2021-03-13 DIAGNOSIS — Z791 Long term (current) use of non-steroidal anti-inflammatories (NSAID): Secondary | ICD-10-CM | POA: Diagnosis not present

## 2021-03-13 DIAGNOSIS — Z7982 Long term (current) use of aspirin: Secondary | ICD-10-CM | POA: Diagnosis not present

## 2021-03-13 DIAGNOSIS — Z9981 Dependence on supplemental oxygen: Secondary | ICD-10-CM | POA: Diagnosis not present

## 2021-03-13 DIAGNOSIS — L97811 Non-pressure chronic ulcer of other part of right lower leg limited to breakdown of skin: Secondary | ICD-10-CM | POA: Diagnosis not present

## 2021-03-13 DIAGNOSIS — Z8673 Personal history of transient ischemic attack (TIA), and cerebral infarction without residual deficits: Secondary | ICD-10-CM | POA: Diagnosis not present

## 2021-03-13 DIAGNOSIS — I87333 Chronic venous hypertension (idiopathic) with ulcer and inflammation of bilateral lower extremity: Secondary | ICD-10-CM | POA: Diagnosis not present

## 2021-03-13 DIAGNOSIS — L97821 Non-pressure chronic ulcer of other part of left lower leg limited to breakdown of skin: Secondary | ICD-10-CM | POA: Diagnosis not present

## 2021-03-13 DIAGNOSIS — G4733 Obstructive sleep apnea (adult) (pediatric): Secondary | ICD-10-CM | POA: Diagnosis not present

## 2021-03-15 ENCOUNTER — Encounter (HOSPITAL_BASED_OUTPATIENT_CLINIC_OR_DEPARTMENT_OTHER): Payer: Medicare Other | Attending: Internal Medicine | Admitting: Internal Medicine

## 2021-03-15 ENCOUNTER — Other Ambulatory Visit: Payer: Self-pay

## 2021-03-15 DIAGNOSIS — I872 Venous insufficiency (chronic) (peripheral): Secondary | ICD-10-CM | POA: Insufficient documentation

## 2021-03-15 DIAGNOSIS — I110 Hypertensive heart disease with heart failure: Secondary | ICD-10-CM | POA: Insufficient documentation

## 2021-03-15 DIAGNOSIS — L97811 Non-pressure chronic ulcer of other part of right lower leg limited to breakdown of skin: Secondary | ICD-10-CM | POA: Diagnosis not present

## 2021-03-15 DIAGNOSIS — Z833 Family history of diabetes mellitus: Secondary | ICD-10-CM | POA: Insufficient documentation

## 2021-03-15 DIAGNOSIS — I5032 Chronic diastolic (congestive) heart failure: Secondary | ICD-10-CM | POA: Insufficient documentation

## 2021-03-15 DIAGNOSIS — Z8249 Family history of ischemic heart disease and other diseases of the circulatory system: Secondary | ICD-10-CM | POA: Insufficient documentation

## 2021-03-15 DIAGNOSIS — L97821 Non-pressure chronic ulcer of other part of left lower leg limited to breakdown of skin: Secondary | ICD-10-CM

## 2021-03-15 DIAGNOSIS — I87333 Chronic venous hypertension (idiopathic) with ulcer and inflammation of bilateral lower extremity: Secondary | ICD-10-CM

## 2021-03-15 DIAGNOSIS — I890 Lymphedema, not elsewhere classified: Secondary | ICD-10-CM | POA: Insufficient documentation

## 2021-03-15 NOTE — Progress Notes (Signed)
[name] [name] ([id number]) , Visit Report for 03/15/2021 Arrival Information Details Patient Name: Date of Service: [name] Ro RDIN W. 03/15/2021 12:30 PM Medical Record Number: [id number] Patient Account Number: [id number] Date of Birth/Sex: Treating RN: [birth year] (65 y.o. [name] Primary Care Rand Etchison: [name] Other Clinician: Referring Taletha Twiford: Treating Hollace Michelli/Extender: Quentin Cornwall in Treatment: 77 Visit Information History Since Last Visit Added or deleted any medications: No Patient Arrived: [name]: No Arrival Time: 12:40 Had a fall or experienced change in No Accompanied By: alone activities of daily living that may affect Transfer Assistance: None risk of falls: Patient Identification Verified: Yes Signs or symptoms of abuse/neglect since last visito No Secondary Verification Process Completed: Yes Hospitalized since last visit: No Patient Requires Transmission-Based Precautions: No Implantable device outside of the clinic excluding No Patient Has Alerts: Yes cellular tissue based products placed in the center Patient Alerts: R ABI = 1.25 since last visit: L ABI = 1.27 Has Dressing in Place as Prescribed: Yes Has Compression in Place as Prescribed: Yes Pain Present Now: No Electronic Signature(s) Signed: 03/15/2021 5:39:04 PM By: Levan Hurst RN, BSN Entered By: Levan Hurst on 03/15/2021 12:40:42 -------------------------------------------------------------------------------- Compression Therapy Details Patient Name: Date of Service: [name] Ro RDIN W. 03/15/2021 12:30 PM Medical Record Number: [id number] Patient Account Number: [id number] Date of Birth/Sex: Treating RN: 11-Jan-1956 (65 y.o. Hessie Diener Primary Care Donell Sliwinski: [name] Other Clinician: Referring Punam Broussard: Treating Donta Mcinroy/Extender: Quentin Cornwall in Treatment: 68 Compression Therapy  Performed for Wound Assessment: Wound #65 Left,Distal,Anterior Lower Leg Performed By: Clinician Deon Pilling, RN Compression Type: Four Layer Post Procedure Diagnosis Same as Pre-procedure Electronic Signature(s) Signed: 03/15/2021 4:38:16 PM By: Deon Pilling Entered By: Deon Pilling on 03/15/2021 13:03:07 -------------------------------------------------------------------------------- Compression Therapy Details Patient Name: Date of Service: [name] Ro RDIN W. 03/15/2021 12:30 PM Medical Record Number: [id number] Patient Account Number: [id number] Date of Birth/Sex: Treating RN: May 09, 1956 (65 y.o. Hessie Diener Primary Care Philipp Callegari: [name] Other Clinician: Referring Dionna Wiedemann: Treating Ikea Demicco/Extender: Quentin Cornwall in Treatment: 68 Compression Therapy Performed for Wound Assessment: Wound #68 Left,Proximal,Anterior Lower Leg Performed By: Clinician Deon Pilling, RN Compression Type: Four Layer Post Procedure Diagnosis Same as Pre-procedure Electronic Signature(s) Signed: 03/15/2021 4:38:16 PM By: Deon Pilling Entered By: Deon Pilling on 03/15/2021 13:03:07 -------------------------------------------------------------------------------- Compression Therapy Details Patient Name: Date of Service: [name] Ro RDIN W. 03/15/2021 12:30 PM Medical Record Number: [id number] Patient Account Number: [id number] Date of Birth/Sex: Treating RN: 1955-10-21 (65 y.o. Hessie Diener Primary Care Jaiona Simien: [name] Other Clinician: Referring Canesha Tesfaye: Treating Jeremy Ditullio/Extender: Quentin Cornwall in Treatment: 68 Compression Therapy Performed for Wound Assessment: Wound #73 Right,Lateral Lower Leg Performed By: Clinician Deon Pilling, RN Compression Type: Four Layer Post Procedure Diagnosis Same as Pre-procedure Electronic Signature(s) Signed: 03/15/2021 4:38:16 PM By: Deon Pilling Entered By: Deon Pilling on 03/15/2021  13:03:07 -------------------------------------------------------------------------------- Compression Therapy Details Patient Name: Date of Service: [name] Ro RDIN W. 03/15/2021 12:30 PM Medical Record Number: [id number] Patient Account Number: [id number] Date of Birth/Sex: Treating RN: [birth year] (65 y.o. Hessie Diener Primary Care Nikitas Davtyan: [name] Other Clinician: Referring Yaman Grauberger: Treating Brizeida Mcmurry/Extender: Quentin Cornwall in Treatment: 68 Compression Therapy Performed for Wound Assessment: Wound #77 Left,Proximal,Lateral Lower Leg Performed By: Clinician Deon Pilling, RN Compression Type: Four Layer Post Procedure Diagnosis Same as Pre-procedure Electronic Signature(s) Signed: 03/15/2021 4:38:16 PM By: Deon Pilling Entered By: Deon Pilling  on 03/15/2021 13:03:07 -------------------------------------------------------------------------------- Compression Therapy Details Patient Name: Date of Service: [name] Ro RDIN W. 03/15/2021 12:30 PM Medical Record Number: [id number] Patient Account Number: [id number] Date of Birth/Sex: Treating RN: 1956-01-15 (65 y.o. Hessie Diener Primary Care Gabreil Yonkers: [name] Other Clinician: Referring Franke Menter: Treating Aaliyah Cancro/Extender: Quentin Cornwall in Treatment: 68 Compression Therapy Performed for Wound Assessment: Wound #78 Right,Posterior Lower Leg Performed By: Clinician Deon Pilling, RN Compression Type: Four Layer Post Procedure Diagnosis Same as Pre-procedure Electronic Signature(s) Signed: 03/15/2021 4:38:16 PM By: Deon Pilling Entered By: Deon Pilling on 03/15/2021 13:03:07 -------------------------------------------------------------------------------- Encounter Discharge Information Details Patient Name: Date of Service: [name] [name], [name] RDIN W. 03/15/2021 12:30 PM Medical Record Number: [id number] Patient Account Number: [id number] Date of Birth/Sex: Treating  RN: [birth year] (65 y.o. [name] Primary Care Sakai Wolford: [name] Other Clinician: Referring Christelle Igoe: Treating Maze Corniel/Extender: Quentin Cornwall in Treatment: 14 Encounter Discharge Information Items Discharge Condition: Stable Ambulatory Status: Cane Discharge Destination: Home Transportation: Private Auto Accompanied By: alone Schedule Follow-up Appointment: Yes Clinical Summary of Care: Patient Declined Electronic Signature(s) Signed: 03/15/2021 5:39:04 PM By: Levan Hurst RN, BSN Entered By: Levan Hurst on 03/15/2021 13:24:12 -------------------------------------------------------------------------------- Lower Extremity Assessment Details Patient Name: Date of Service: [name] Ro RDIN W. 03/15/2021 12:30 PM Medical Record Number: [id number] Patient Account Number: [id number] Date of Birth/Sex: Treating RN: [birth year] (65 y.o. [name] Primary Care Barron Vanloan: [name] Other Clinician: Referring Jamall Strohmeier: Treating Criag Wicklund/Extender: Quentin Cornwall in Treatment: 68 Edema Assessment Assessed: [name]: No] [name]: No] Edema: [Left: Yes] [Right: Yes] Calf Left: Right: Point of Measurement: 30 cm From Medial Instep 41 cm 44.5 cm Ankle Left: Right: Point of Measurement: 13 cm From Medial Instep 25 cm 22.5 cm Electronic Signature(s) Signed: 03/15/2021 5:39:04 PM By: Levan Hurst RN, BSN Entered By: Levan Hurst on 03/15/2021 12:56:15 -------------------------------------------------------------------------------- Multi Wound Chart Details Patient Name: Date of Service: [name] [name], [name] RDIN W. 03/15/2021 12:30 PM Medical Record Number: [id number] Patient Account Number: [id number] Date of Birth/Sex: Treating RN: [birth year] (65 y.o. Lorette Ang, Meta.Reding Primary Care Tityana Pagan: [name] Other Clinician: Referring Janace Decker: Treating Riaz Onorato/Extender: Quentin Cornwall in Treatment:  75 Vital Signs Height(in): 70 Pulse(bpm): 21 Weight(lbs): 360 Blood Pressure(mmHg): 188/86 Body Mass Index(BMI): 52 Temperature(F): 97.6 Respiratory Rate(breaths/min): 22 Photos: [65:No Photos Left, Distal, Anterior Lower Leg] [68:No Photos Left, Proximal, Anterior Lower Leg] [73:No Photos Right, Lateral Lower Leg] Wound Location: [65:Gradually Appeared] [68:Gradually Appeared] [73:Gradually Appeared] Wounding Event: [65:Venous Leg Ulcer] [68:Lymphedema] [73:Lymphedema] Primary Etiology: [65:Lymphedema] [68:N/A] [73:N/A] Secondary Etiology: [65:Lymphedema, Sleep Apnea,] [68:Lymphedema, Sleep Apnea,] [73:Lymphedema, Sleep Apnea,] Comorbid History: [65:Congestive Heart Failure, Hypertension, Peripheral Venous Disease, Osteoarthritis 09/08/2019] [68:Congestive Heart Failure, Hypertension, Peripheral Venous Disease, Osteoarthritis 03/23/2020] [73:Congestive Heart Failure,  Hypertension, Peripheral Venous Disease, Osteoarthritis 08/03/2020] Date Acquired: [65:68] [68:51] [73:30] Weeks of Treatment: [65:Open] [68:Open] [73:Open] Wound Status: [65:Yes] [68:Yes] [73:Yes] Clustered Wound: [65:1] [68:1] [73:4] Clustered Quantity: [65:0.6x1.1x0.2] [68:0.1x0.1x0.1] [73:2.7x1.4x0.1] Measurements L x W x D (cm) [65:0.518] [35:5.732] [73:2.969] A (cm) : rea [65:0.104] [68:0.001] [73:0.297] Volume (cm) : [65:99.90%] [68:99.60%] [73:96.20%] % Reduction in Area: [65:99.80%] [68:99.40%] [73:96.20%] % Reduction in Volume: [65:Full Thickness Without Exposed] [68:Full Thickness Without Exposed] [73:Full Thickness Without Exposed] Classification: [65:Support Structures Medium] [68:Support Structures Small] [73:Support Structures Medium] Exudate Amount: [65:Serosanguineous] [68:Serosanguineous] [73:Serosanguineous] Exudate Type: [65:red, brown] [68:red, brown] [73:red, brown] Exudate Color: [65:Distinct, outline attached] [68:Distinct, outline attached] [73:Distinct, outline attached] Wound Margin:  [65:Small (1-33%)] [68:Large (67-100%)] [73:Large (67-100%)] Granulation Amount: [65:Pink] [68:Red] [73:Red]  Granulation Quality: [65:Large (67-100%)] [68:None Present (0%)] [73:None Present (0%)] Necrotic Amount: [65:Fat Layer (Subcutaneous Tissue): Yes Fat Layer (Subcutaneous Tissue): Yes Fat Layer (Subcutaneous Tissue): Yes] Exposed Structures: [65:Fascia: No Tendon: No Muscle: No Joint: No Bone: No Medium (34-66%)] [68:Fascia: No Tendon: No Muscle: No Joint: No Bone: No Large (67-100%)] [73:Fascia: No Tendon: No Muscle: No Joint: No Bone: No Small (1-33%)] Epithelialization: [65:Compression Therapy] [68:Compression Therapy] [73:Compression Finderne Wound Number: [id number] Photos: No Photos No Photos No Photos Left, Lateral Lower Leg Left, Proximal, Lateral Lower Leg Right, Posterior Lower Leg Wound Location: Gradually Appeared Gradually Appeared Gradually Appeared Wounding Event: Lymphedema Lymphedema Lymphedema Primary Etiology: N/A N/A N/A Secondary Etiology: Lymphedema, Sleep Apnea, Lymphedema, Sleep Apnea, Lymphedema, Sleep Apnea, Comorbid History: Congestive Heart Failure, Congestive Heart Failure, Congestive Heart Failure, Hypertension, Peripheral Venous Hypertension, Peripheral Venous Hypertension, Peripheral Venous Disease, Osteoarthritis Disease, Osteoarthritis Disease, Osteoarthritis 09/28/2020 11/16/2020 11/16/2020 Date Acquired: _0 Weeks of Treatment: Healed - Epithelialized Open Open Wound Status: No No No Clustered Wound: N/A N/A N/A Clustered Quantity: 0x0x0 0.5x0.3x0.1 0.5x0.4x0.1 Measurements L x W x D (cm) 0 0.118 0.157 A (cm) : rea 0 0.012 0.016 Volume (cm) : 100.00% 96.70% 98.50% % Reduction in Area: 100.00% 96.60% 98.50% % Reduction in Volume: Full Thickness Without Exposed Full Thickness Without Exposed Full Thickness Without Exposed Classification: Support Structures Support Structures Support Structures None Present Medium Medium Exudate  Amount: N/A Serosanguineous Serosanguineous Exudate Type: N/A red, brown red, brown Exudate Color: Distinct, outline attached Distinct, outline attached Distinct, outline attached Wound Margin: None Present (0%) Large (67-100%) Large (67-100%) Granulation Amount: N/A Red Red Granulation Quality: None Present (0%) None Present (0%) None Present (0%) Necrotic Amount: Fascia: No Fat Layer (Subcutaneous Tissue): Yes Fat Layer (Subcutaneous Tissue): Yes Exposed Structures: Fat Layer (Subcutaneous Tissue): No Fascia: No Fascia: No Tendon: No Tendon: No Tendon: No Muscle: No Muscle: No Muscle: No Joint: No Joint: No Joint: No Bone: No Bone: No Bone: No Large (67-100%) Small (1-33%) Large (67-100%) Epithelialization: N/A Compression Therapy Compression Therapy Procedures Performed: Treatment Notes Electronic Signature(s) Signed: 03/15/2021 1:24:10 PM By: Kalman Shan DO Signed: 03/15/2021 4:38:16 PM By: Deon Pilling Entered By: Kalman Shan on 03/15/2021 13:12:20 -------------------------------------------------------------------------------- Multi-Disciplinary Care Plan Details Patient Name: Date of Service: [name] [name], [name] RDIN W. 03/15/2021 12:30 PM Medical Record Number: [id number] Patient Account Number: [id number] Date of Birth/Sex: Treating RN: [birth year] (65 y.o. Hessie Diener Primary Care Asako Saliba: [name] Other Clinician: Referring Maria Gallicchio: Treating Missi Mcmackin/Extender: Quentin Cornwall in Treatment: 110 Active Inactive Venous Leg Ulcer Nursing Diagnoses: Knowledge deficit related to disease process and management Potential for venous Insuffiency (use before diagnosis confirmed) Goals: Patient will maintain optimal edema control Date Initiated: 01/11/2021 Target Resolution Date: 04/15/2021 Goal Status: Active Interventions: Assess peripheral edema status every visit. Compression as ordered Treatment Activities: Therapeutic  compression applied : 01/11/2021 Notes: Wound/Skin Impairment Nursing Diagnoses: Knowledge deficit related to ulceration/compromised skin integrity Goals: Patient/caregiver will verbalize understanding of skin care regimen Date Initiated: 11/25/2019 Target Resolution Date: 04/15/2021 Goal Status: Active Ulcer/skin breakdown will have a volume reduction of 30% by week 4 Date Initiated: 11/25/2019 Date Inactivated: 01/06/2020 Target Resolution Date: 12/26/2019 Goal Status: Met Ulcer/skin breakdown will have a volume reduction of 50% by week 8 Date Initiated: 01/06/2020 Date Inactivated: 02/17/2020 Target Resolution Date: 02/05/2020 Goal Status: Met Interventions: Assess patient/caregiver ability to obtain necessary supplies Assess patient/caregiver ability to perform ulcer/skin care regimen upon admission and as needed Assess ulceration(s) every visit Notes: Electronic  Signature(s) Signed: 03/15/2021 4:38:16 PM By: Deon Pilling Entered By: Deon Pilling on 03/15/2021 13:05:56 -------------------------------------------------------------------------------- Pain Assessment Details Patient Name: Date of Service: [name] Ro RDIN W. 03/15/2021 12:30 PM Medical Record Number: [id number] Patient Account Number: [id number] Date of Birth/Sex: Treating RN: 1955/11/19 (65 y.o. [name] Primary Care Alexia Dinger: [name] Other Clinician: Referring Anishka Bushard: Treating Jamerius Boeckman/Extender: Quentin Cornwall in Treatment: 32 Active Problems Location of Pain Severity and Description of Pain Patient Has Paino No Site Locations Pain Management and Medication Current Pain Management: Electronic Signature(s) Signed: 03/15/2021 5:39:04 PM By: Levan Hurst RN, BSN Entered By: Levan Hurst on 03/15/2021 12:41:06 -------------------------------------------------------------------------------- Patient/Caregiver Education Details Patient Name: Date of Service: [name] 6/28/2022andnbsp12:30 PM Medical Record Number: [id number] Patient Account Number: [id number] Date of Birth/Gender: Treating RN: Jan 24, 1956 (65 y.o. Hessie Diener Primary Care Physician: [name] Other Clinician: Referring Physician: Treating Physician/Extender: Quentin Cornwall in Treatment: 66 Education Assessment Education Provided To: Patient Education Topics Provided Wound/Skin Impairment: Handouts: Skin Care Do's and Dont's Methods: Explain/Verbal Responses: Reinforcements needed Electronic Signature(s) Signed: 03/15/2021 4:38:16 PM By: Deon Pilling Entered By: Deon Pilling on 03/15/2021 13:05:42 -------------------------------------------------------------------------------- Wound Assessment Details Patient Name: Date of Service: [name] Ro RDIN W. 03/15/2021 12:30 PM Medical Record Number: [id number] Patient Account Number: [id number] Date of Birth/Sex: Treating RN: 1956/03/19 (65 y.o. [name] Primary Care Bari Handshoe: [name] Other Clinician: Referring Irania Durell: Treating Embrie Mikkelsen/Extender: Quentin Cornwall in Treatment: 68 Wound Status Wound Number: 65 Primary Venous Leg Ulcer Etiology: Wound Location: Left, Distal, Anterior Lower Leg Secondary Lymphedema Wounding Event: Gradually Appeared Etiology: Date Acquired: 09/08/2019 Wound Open Weeks Of Treatment: 68 Status: Clustered Wound: Yes Comorbid Lymphedema, Sleep Apnea, Congestive Heart Failure, History: Hypertension, Peripheral Venous Disease, Osteoarthritis Photos Wound Measurements Length: (cm) 0. Width: (cm) 1. Depth: (cm) 0. Clustered Quantity: 1 Area: (cm) 0 Volume: (cm) 0 6 % Reduction in Area: 99.9% 1 % Reduction in Volume: 99.8% 2 Epithelialization: Medium (34-66%) Tunneling: No .518 Undermining: No .104 Wound Description Classification: Full Thickness Without Exposed Support Structures Wound Margin: Distinct, outline  attached Exudate Amount: Medium Exudate Type: Serosanguineous Exudate Color: red, brown Foul Odor After Cleansing: No Slough/Fibrino No Wound Bed Granulation Amount: Small (1-33%) Exposed Structure Granulation Quality: Pink Fascia Exposed: No Necrotic Amount: Large (67-100%) Fat Layer (Subcutaneous Tissue) Exposed: Yes Necrotic Quality: Adherent Slough Tendon Exposed: No Muscle Exposed: No Joint Exposed: No Bone Exposed: No Treatment Notes Wound #65 (Lower Leg) Wound Laterality: Left, Anterior, Distal Cleanser Normal Saline Discharge Instruction: Cleanse the wound with Normal Saline prior to applying a clean dressing using gauze sponges, not tissue or cotton balls. Peri-Wound Care Triamcinolone 15 (g) Discharge Instruction: Use triamcinolone 15 (g) as directed Sween Lotion (Moisturizing lotion) Discharge Instruction: Apply moisturizing lotion as directed Topical Primary Dressing KerraCel Ag Gelling Fiber Dressing, 4x5 in (silver alginate) Discharge Instruction: Apply silver alginate to wound bed as instructed Secondary Dressing Woven Gauze Sponge, Non-Sterile 4x4 in Discharge Instruction: Apply over primary dressing as directed. ABD Pad, 5x9 Discharge Instruction: Apply over primary dressing as directed. Optifoam Non-Adhesive Dressing, 4x4 in Discharge Instruction: ***APPLY FOAM AT THE BEND OF FOOT AND LEG.***Apply over primary dressing as directed. Secured With Compression Wrap FourPress (4 layer compression wrap) Discharge Instruction: Apply four layer compression as directed. Compression Stockings Add-Ons Electronic Signature(s) Signed: 03/15/2021 4:39:21 PM By: Sandre Kitty Signed: 03/15/2021 5:39:04 PM By: Levan Hurst RN, BSN Entered By: Sandre Kitty on 03/15/2021 16:35:15 --------------------------------------------------------------------------------  Wound Assessment Details Patient Name: Date of Service: [name]. 03/15/2021 12:30  PM Medical Record Number: [id number] Patient Account Number: [id number] Date of Birth/Sex: Treating RN: Dec 30, 1955 (65 y.o. [name] Primary Care Jaydien Panepinto: [name] Other Clinician: Referring Lorece Keach: Treating Lasasha Brophy/Extender: Quentin Cornwall in Treatment: 68 Wound Status Wound Number: 68 Primary Lymphedema Etiology: Wound Location: Left, Proximal, Anterior Lower Leg Wound Open Wounding Event: Gradually Appeared Status: Date Acquired: 03/23/2020 Comorbid Lymphedema, Sleep Apnea, Congestive Heart Failure, Weeks Of Treatment: 51 History: Hypertension, Peripheral Venous Disease, Osteoarthritis Clustered Wound: Yes Wound Measurements Length: (cm) 0.1 Width: (cm) 0.1 Depth: (cm) 0.1 Clustered Quantity: 1 Area: (cm) 0.008 Volume: (cm) 0.001 % Reduction in Area: 99.6% % Reduction in Volume: 99.4% Epithelialization: Large (67-100%) Tunneling: No Wound Description Classification: Full Thickness Without Exposed Support Structures Wound Margin: Distinct, outline attached Exudate Amount: Small Exudate Type: Serosanguineous Exudate Color: red, brown Foul Odor After Cleansing: No Slough/Fibrino No Wound Bed Granulation Amount: Large (67-100%) Exposed Structure Granulation Quality: Red Fascia Exposed: No Necrotic Amount: None Present (0%) Fat Layer (Subcutaneous Tissue) Exposed: Yes Tendon Exposed: No Muscle Exposed: No Joint Exposed: No Bone Exposed: No Treatment Notes Wound #68 (Lower Leg) Wound Laterality: Left, Anterior, Proximal Cleanser Normal Saline Discharge Instruction: Cleanse the wound with Normal Saline prior to applying a clean dressing using gauze sponges, not tissue or cotton balls. Peri-Wound Care Triamcinolone 15 (g) Discharge Instruction: Use triamcinolone 15 (g) as directed Sween Lotion (Moisturizing lotion) Discharge Instruction: Apply moisturizing lotion as directed Topical Primary Dressing KerraCel Ag Gelling  Fiber Dressing, 4x5 in (silver alginate) Discharge Instruction: Apply silver alginate to wound bed as instructed Secondary Dressing Woven Gauze Sponge, Non-Sterile 4x4 in Discharge Instruction: Apply over primary dressing as directed. ABD Pad, 5x9 Discharge Instruction: Apply over primary dressing as directed. Secured With Compression Wrap FourPress (4 layer compression wrap) Discharge Instruction: Apply four layer compression as directed. Compression Stockings Add-Ons Electronic Signature(s) Signed: 03/15/2021 5:39:04 PM By: Levan Hurst RN, BSN Entered By: Levan Hurst on 03/15/2021 12:54:38 -------------------------------------------------------------------------------- Wound Assessment Details Patient Name: Date of Service: [name] Ro RDIN W. 03/15/2021 12:30 PM Medical Record Number: [id number] Patient Account Number: [id number] Date of Birth/Sex: Treating RN: 1956/02/26 (65 y.o. [name] Primary Care Garl Speigner: [name] Other Clinician: Referring Jerson Furukawa: Treating Kalee Mcclenathan/Extender: Quentin Cornwall in Treatment: 68 Wound Status Wound Number: 73 Primary Lymphedema Etiology: Wound Location: Right, Lateral Lower Leg Wound Open Wounding Event: Gradually Appeared Status: Date Acquired: 08/03/2020 Comorbid Lymphedema, Sleep Apnea, Congestive Heart Failure, Weeks Of Treatment: 30 History: Hypertension, Peripheral Venous Disease, Osteoarthritis Clustered Wound: Yes Photos Wound Measurements Length: (cm) 2.7 Width: (cm) 1.4 Depth: (cm) 0.1 Clustered Quantity: 4 Area: (cm) 2.969 Volume: (cm) 0.297 % Reduction in Area: 96.2% % Reduction in Volume: 96.2% Epithelialization: Small (1-33%) Tunneling: No Undermining: No Wound Description Classification: Full Thickness Without Exposed Support Structures Wound Margin: Distinct, outline attached Exudate Amount: Medium Exudate Type: Serosanguineous Exudate Color: red, brown Foul Odor  After Cleansing: No Slough/Fibrino No Wound Bed Granulation Amount: Large (67-100%) Exposed Structure Granulation Quality: Red Fascia Exposed: No Necrotic Amount: None Present (0%) Fat Layer (Subcutaneous Tissue) Exposed: Yes Tendon Exposed: No Muscle Exposed: No Joint Exposed: No Bone Exposed: No Treatment Notes Wound #73 (Lower Leg) Wound Laterality: Right, Lateral Cleanser Normal Saline Discharge Instruction: Cleanse the wound with Normal Saline prior to applying a clean dressing using gauze sponges, not tissue or cotton balls. Peri-Wound Care Triamcinolone 15 (g) Discharge Instruction: Use  triamcinolone 15 (g) as directed Sween Lotion (Moisturizing lotion) Discharge Instruction: Apply moisturizing lotion as directed Topical Primary Dressing KerraCel Ag Gelling Fiber Dressing, 4x5 in (silver alginate) Discharge Instruction: Apply silver alginate to wound bed as instructed Secondary Dressing Woven Gauze Sponge, Non-Sterile 4x4 in Discharge Instruction: Apply over primary dressing as directed. ABD Pad, 5x9 Discharge Instruction: Apply over primary dressing as directed. Secured With Compression Wrap FourPress (4 layer compression wrap) Discharge Instruction: Apply four layer compression as directed. Compression Stockings Add-Ons Electronic Signature(s) Signed: 03/15/2021 4:39:21 PM By: Sandre Kitty Signed: 03/15/2021 5:39:04 PM By: Levan Hurst RN, BSN Entered By: Sandre Kitty on 03/15/2021 16:35:42 -------------------------------------------------------------------------------- Wound Assessment Details Patient Name: Date of Service: [name] Ro [place] W. 03/15/2021 12:30 PM Medical Record Number: [id number] Patient Account Number: [id number] Date of Birth/Sex: Treating RN: 1956-04-07 (65 y.o. [name] Primary Care Vonna Brabson: [name] Other Clinician: Referring Valor [place]: Treating Lorice Lafave/Extender: Quentin Cornwall in Treatment:  68 Wound Status Wound Number: [id number] Primary Lymphedema Etiology: Wound Location: Left, Lateral Lower Leg Wound Healed - Epithelialized Wounding Event: Gradually Appeared Status: Date Acquired: 09/28/2020 Comorbid Lymphedema, Sleep Apnea, Congestive Heart Failure, Weeks Of Treatment: 24 History: Hypertension, Peripheral Venous Disease, Osteoarthritis Clustered Wound: No Wound Measurements Length: (cm) Width: (cm) Depth: (cm) Area: (cm) Volume: (cm) 0 % Reduction in Area: 100% 0 % Reduction in Volume: 100% 0 Epithelialization: Large (67-100%) 0 Tunneling: No 0 Undermining: No Wound Description Classification: Full Thickness Without Exposed Support Structures Wound Margin: Distinct, outline attached Exudate Amount: None Present Foul Odor After Cleansing: No Slough/Fibrino No Wound Bed Granulation Amount: None Present (0%) Exposed Structure Necrotic Amount: None Present (0%) Fascia Exposed: No Fat Layer (Subcutaneous Tissue) Exposed: No Tendon Exposed: No Muscle Exposed: No Joint Exposed: No Bone Exposed: No Electronic Signature(s) Signed: 03/15/2021 5:39:04 PM By: Levan Hurst RN, BSN Entered By: Levan Hurst on 03/15/2021 12:55:16 -------------------------------------------------------------------------------- Wound Assessment Details Patient Name: Date of Service: [name] Ro [place] W. 03/15/2021 12:30 PM Medical Record Number: [id number] Patient Account Number: [id number] Date of Birth/Sex: Treating RN: 1956/03/09 (65 y.o. [name] Primary Care Kitara Hebb: [name] Other Clinician: Referring Aarica Wax: Treating Lenoria Narine/Extender: Quentin Cornwall in Treatment: 68 Wound Status Wound Number: [id number] Primary Lymphedema Etiology: Wound Location: Left, Proximal, Lateral Lower Leg Wound Open Wounding Event: Gradually Appeared Status: Date Acquired: 11/16/2020 Comorbid Lymphedema, Sleep Apnea, Congestive Heart Failure, Weeks Of Treatment:  17 History: Hypertension, Peripheral Venous Disease, Osteoarthritis Clustered Wound: No Photos Wound Measurements Length: (cm) 0.5 Width: (cm) 0.3 Depth: (cm) 0.1 Area: (cm) 0.118 Volume: (cm) 0.012 % Reduction in Area: 96.7% % Reduction in Volume: 96.6% Epithelialization: Small (1-33%) Tunneling: No Undermining: No Wound Description Classification: Full Thickness Without Exposed Support Structures Wound Margin: Distinct, outline attached Exudate Amount: Medium Exudate Type: Serosanguineous Exudate Color: red, brown Foul Odor After Cleansing: No Slough/Fibrino No Wound Bed Granulation Amount: Large (67-100%) Exposed Structure Granulation Quality: Red Fascia Exposed: No Necrotic Amount: None Present (0%) Fat Layer (Subcutaneous Tissue) Exposed: Yes Tendon Exposed: No Muscle Exposed: No Joint Exposed: No Bone Exposed: No Treatment Notes Wound #77 (Lower Leg) Wound Laterality: Left, Lateral, Proximal Cleanser Normal Saline Discharge Instruction: Cleanse the wound with Normal Saline prior to applying a clean dressing using gauze sponges, not tissue or cotton balls. Peri-Wound Care Triamcinolone 15 (g) Discharge Instruction: Use triamcinolone 15 (g) as directed Sween Lotion (Moisturizing lotion) Discharge Instruction: Apply moisturizing lotion as directed Topical Primary Dressing KerraCel Ag Gelling Fiber Dressing, 4x5 in (silver alginate)  Discharge Instruction: Apply silver alginate to wound bed as instructed Secondary Dressing Woven Gauze Sponge, Non-Sterile 4x4 in Discharge Instruction: Apply over primary dressing as directed. ABD Pad, 5x9 Discharge Instruction: Apply over primary dressing as directed. Secured With Compression Wrap FourPress (4 layer compression wrap) Discharge Instruction: Apply four layer compression as directed. Compression Stockings Add-Ons Electronic Signature(s) Signed: 03/15/2021 4:39:21 PM By: Sandre Kitty Signed: 03/15/2021  5:39:04 PM By: Levan Hurst RN, BSN Entered By: Sandre Kitty on 03/15/2021 16:34:50 -------------------------------------------------------------------------------- Wound Assessment Details Patient Name: Date of Service: [name] [name], [name] [place] W. 03/15/2021 12:30 PM Medical Record Number: [id number] Patient Account Number: [id number] Date of Birth/Sex: Treating RN: [birth year] (65 y.o. [name] Primary Care Koleman Marling: [name] Other Clinician: Referring Kynsleigh [place]: Treating Myrla Malanowski/Extender: Quentin Cornwall in Treatment: 68 Wound Status Wound Number: 78 Primary Lymphedema Etiology: Wound Location: Right, Posterior Lower Leg Wound Open Wounding Event: Gradually Appeared Status: Date Acquired: 11/16/2020 Comorbid Lymphedema, Sleep Apnea, Congestive Heart Failure, Weeks Of Treatment: 17 History: Hypertension, Peripheral Venous Disease, Osteoarthritis Clustered Wound: No Wound Measurements Length: (cm) 0.5 Width: (cm) 0.4 Depth: (cm) 0.1 Area: (cm) 0.157 Volume: (cm) 0.016 % Reduction in Area: 98.5% % Reduction in Volume: 98.5% Epithelialization: Large (67-100%) Tunneling: No Undermining: No Wound Description Classification: Full Thickness Without Exposed Support Structures Wound Margin: Distinct, outline attached Exudate Amount: Medium Exudate Type: Serosanguineous Exudate Color: red, brown Foul Odor After Cleansing: No Slough/Fibrino No Wound Bed Granulation Amount: Large (67-100%) Exposed Structure Granulation Quality: Red Fascia Exposed: No Necrotic Amount: None Present (0%) Fat Layer (Subcutaneous Tissue) Exposed: Yes Tendon Exposed: No Muscle Exposed: No Joint Exposed: No Bone Exposed: No Treatment Notes Wound #78 (Lower Leg) Wound Laterality: Right, Posterior Cleanser Normal Saline Discharge Instruction: Cleanse the wound with Normal Saline prior to applying a clean dressing using gauze sponges, not tissue or cotton  balls. Peri-Wound Care Triamcinolone 15 (g) Discharge Instruction: Use triamcinolone 15 (g) as directed Sween Lotion (Moisturizing lotion) Discharge Instruction: Apply moisturizing lotion as directed Topical Primary Dressing KerraCel Ag Gelling Fiber Dressing, 4x5 in (silver alginate) Discharge Instruction: Apply silver alginate to wound bed as instructed Secondary Dressing Woven Gauze Sponge, Non-Sterile 4x4 in Discharge Instruction: Apply over primary dressing as directed. ABD Pad, 5x9 Discharge Instruction: Apply over primary dressing as directed. Secured With Compression Wrap FourPress (4 layer compression wrap) Discharge Instruction: Apply four layer compression as directed. Compression Stockings Add-Ons Electronic Signature(s) Signed: 03/15/2021 5:39:04 PM By: Levan Hurst RN, BSN Entered By: Levan Hurst on 03/15/2021 12:55:52 -------------------------------------------------------------------------------- [place] Details Patient Name: Date of Service: [name] [name], [name] [place] W. 03/15/2021 12:30 PM Medical Record Number: [id number] Patient Account Number: [id number] Date of Birth/Sex: Treating RN: 1955-12-22 (65 y.o. [name] Primary Care Navraj Dreibelbis: [name] Other Clinician: Referring Ulyses Panico: Treating Sienna Stonehocker/Extender: Quentin Cornwall in Treatment: 103 Vital Signs Time Taken: 12:40 Temperature (F): 97.6 Height (in): 70 Pulse (bpm): 68 Weight (lbs): 360 Respiratory Rate (breaths/min): 22 Body Mass Index (BMI): 51.6 Blood Pressure (mmHg): 188/86 Reference Range: 80 - 120 mg / dl Electronic Signature(s) Signed: 03/15/2021 5:39:04 PM By: Levan Hurst RN, BSN Entered By: Levan Hurst on 03/15/2021 12:40:59

## 2021-03-15 NOTE — Progress Notes (Signed)
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 wife is back at [facility] he says he has not been using the external compression pumps. Silver alginate to the wounds 5/4; he has good edema control bilaterally. He has not been using the compression pumps he tells [place] his wife is coming home from [place] today. He does not have any open wounds on the right leg continued large collection of small wounds on the left anterior leg extending medially into laterally nothing much posteriorly on the left. 6/1. He has a smattering of small wounds anteriorly on the left  and a larger but superficial wound on the left posterior calf. There is nothing open on the right we have been using silver alginate on the left I will change that the [facility] today 6/22; there is nothing open on his right leg. He has a smattering of small eschars on the left anterior lower leg but no open wounds per se. Somebody applied the compression wrap on the right leg in a very irregular fashion. He has a very tender area on the right medial ankle. 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 Not surprisingly comes back in with wounds on his bilateral legs at the level of where his external compression garment was  tight superiorly. He tells me he is using his pumps once every second day. 7/20; 2-week follow-up the patient has not been using his compression pumps his wife was transferred to a new nursing home. He has 1 wound on the medial left lower leg and a small area on the right lateral 8/17; patient arrives today with only a smattering of small wounds on the left anterior lower leg most of these are eschared and look benign. There is nothing on his right leg. We have been using silver alginate under 4-layer compression 8/31; patient's wounds on the left anterior lower leg are larger. There is still nothing open on the right toe although we did put compression wraps on this last time. He has home health. Says he is used his compression pumps twice in the last 3 days. Obviously not sufficient 9/14; 2-week follow-up. We discharged him in his own zippered stocking. Apparently home health helped him change this and noted weeping edema fluid therefore put him back in a wrap he arrives in the clinic today with no open wounds on the right innumerable small broken areas on the anterior left calf. Uncontrolled edema above the level of the wrap compatible with lymphedema 9/27 2-week follow-up. He arrives today with the left anterior lower extremity looking better. On the right he has a small open area posteriorly. I am going to put him back in compression on both sides. He claims compliance with his compression pumps at home twice a day he has home health changing his dressing 10/26; 1 month follow-up he has home health changing his dressings there is no open wound on the right leg he has extremitease stockings and external compression pumps which he says he is using once a day. The left leg is always been a more difficult site and although it is difficult to identify any precise open wound he has several leaking areas especially anteriorly and superiorly and at the edge of his wraps 11/9; 2-week follow-up. He has  his extremities stocking on the right. He says he has been using his compression pumps once a day. He has 1 remaining wound on the left anterior mid tibia which looks healthy his edema control is otherwise good on the left 11/30; there is now a 3-week follow-up. We use his own compression stocking on the right last time as he has no open wounds. He apparently developed a UTI received a course of ciprofloxacin. He did not wear his compression pumps and I wonder whether he actually was using his stocking. Home health put a compression wrap on his right leg last week. He has multiple small open areas on the left anterior leg this week. 12/7; still a cluster of small areas on the left lateral calf  and the right lateral calf. He has home health changing his dressings. For some reason he will not use compression pumps reliably this week because his wife spent 2 days in the hospital with anemia 12/21; he continues to have a cluster of small areas predominantly on the left lateral but also the left anterior lower leg. More defined wounds on the right anterior and right medial. The he says he is using the compression pumps every other day. I have never understood the issue for this noncompliant. He still has home health coming out 1/11 the patient continues to have multiple small open areas on the left lateral left anterior and medial lower leg anteriorly. He also has a small punched-out painful area in the crease of his left ankle which I think is probably a rapid injury. Very tender. I debrided in this area very adherent debris. Also small areas on the right lateral calf. We have been using silver alginate. His compression pump usage is probably marginal 2/8; the patient comes back in with his legs essentially in the same condition. He has multiple small areas on his bilateral lower extremities left greater than right anteriorly and posteriorly. A lot of these have small eschars on top some of them do not.  His edema control is marginal. He says he uses his pumps once a day. He has home health changing his dressing 4-layer compression silver alginate as the primary dressing 3/1; Marked deterioration this visit. He has multiple small areas across the entirety of the lateral anterior and medial left lower leg. I am actually concerned that he may be on his way to a more substantive degree of breakdown in this area. He is using his compression pumps 4 times per week. He has home health changing this dressing week 3/29; again not much improvement from last time he is here significant bilateral small wounds for the most part skin breakdown. The larger areas are on the left lateral. Small areas bilaterally. He has poor edema control. Relates that he is not using his compression pumps with any reliability. He states his wife was in the hospital last week. He does not complain of chest pain or shortness of breath 4/26; patient presents for 1 month follow-up. He reports no issues. He states that he last used his lymphedema pumps several weeks ago. He has been having his legs wrapped bilaterally with home health with 4-layer compression and silver alginate underneath. He has no complaints today. 5/31; patient presents for 1 month follow-up. He tolerates the compression wraps well. He states that home health continues to come out twice weekly T o change theses. He is unable to use his lymphedema pumps due to the fact that he has to take his shoes off. He states he cannot put his shoes on by himself. He denies any issues today. He denies signs of infection. 6/28; patient presents for 1 month follow-up. He has been tolerating compression wraps well. He states he developed a wound to the anterior part of his ankle due to the positioning of the padding placed by the home health nurse rubbing against the area. He states he noticed this getting worse in the last 2 weeks but this has since been corrected. He reports  increased soreness to the specific area. Overall he is doing well. Electronic Signature(s) Signed: 03/15/2021 1:24:10 PM By: Kalman Shan DO Entered By: Kalman Shan on 03/15/2021 13:15:18 -------------------------------------------------------------------------------- Physical Exam Details Patient Name: Date of Service: [name] [name], [name] 03/15/2021 12:30 PM  Medical Record Number: [id number] Patient Account Number: [id number] Date of Birth/Sex: Treating RN: 05/25/1956 (65 y.o. [name] [name] Primary Care Provider: [name] Other Clinician: Referring Provider: Treating Provider/Extender: Quentin Cornwall in Treatment: 67 Constitutional respirations regular, non-labored and within target range for patient.Marland Kitchen Psychiatric pleasant and cooperative. Notes Bilateral lower extremities: Numerous scattered open wounds limited to skin breakdown. Swelling is greater in the left versus right leg. There is 3+ pitting edema to the knees. No signs of infection. Chronic skin changes suggesting venous stasis and lymphedema. Electronic Signature(s) Signed: 03/15/2021 1:24:10 PM By: Kalman Shan DO Entered By: Kalman Shan on 03/15/2021 13:16:00 -------------------------------------------------------------------------------- Physician Orders Details Patient Name: Date of Service: [name] [name], HA [place] W. 03/15/2021 12:30 PM Medical Record Number: [id number] Patient Account Number: [id number] Date of Birth/Sex: Treating RN: 11-11-1955 (65 y.o. [name] [name] Primary Care Provider: [name] Other Clinician: Referring Provider: Treating Provider/Extender: Quentin Cornwall in Treatment: 31 Verbal / Phone Orders: No Diagnosis Coding ICD-10 Coding Code Description [id number] Chronic venous hypertension (idiopathic) with ulcer and inflammation of bilateral lower extremity I89.0 Lymphedema, not elsewhere classified L97.821 Non-pressure chronic ulcer  of other part of left lower leg limited to breakdown of skin L97.811 Non-pressure chronic ulcer of other part of right lower leg limited to breakdown of skin Follow-up Appointments Return appointment in 1 month. - [facility] Bathing/ Shower/ Hygiene May shower with protection but do not get wound dressing(s) wet. Edema Control - Lymphedema / SCD / Other Lymphedema Pumps. Use Lymphedema pumps on leg(s) 2-3 times a day for 45-60 minutes. If wearing any wraps or hose, do not remove them. Continue exercising as instructed. Elevate legs to the level of the heart or above for 30 minutes daily and/or when sitting, a frequency of: Avoid standing for long periods of time. Exercise regularly Additional Orders / Instructions Other: - Patient to pick antibiotics at pharmacy today. Home Health No change in wound care orders this week; continue Home Health for wound care. May utilize formulary equivalent dressing for wound treatment orders unless otherwise specified. - home health to change 2x a week {if possible Tuesday's and Friday's} Other Home Health Orders/Instructions: - [facility]ound [facility] Wound #65 - Lower Leg Wound Laterality: Left, Anterior, Distal Cleanser: Normal Saline (Home Health) (Generic) 2 x Per Week/30 Days Discharge Instructions: Cleanse the wound with Normal Saline prior to applying a clean dressing using gauze sponges, not tissue or cotton balls. Peri-Wound Care: Triamcinolone 15 (g) 2 x Per Week/30 Days Discharge Instructions: Use triamcinolone 15 (g) as directed Peri-Wound Care: Sween Lotion (Moisturizing lotion) (Home Health) (Generic) 2 x Per Week/30 Days Discharge Instructions: Apply moisturizing lotion as directed Prim Dressing: KerraCel Ag Gelling Fiber Dressing, 4x5 in (silver alginate) (Home Health) (Generic) 2 x Per Week/30 Days ary Discharge Instructions: Apply silver alginate to wound bed as instructed Secondary Dressing: Woven Gauze Sponge, Non-Sterile 4x4 in  (Home Health) (Generic) 2 x Per Week/30 Days Discharge Instructions: Apply over primary dressing as directed. Secondary Dressing: ABD Pad, 5x9 (Home Health) 2 x Per Week/30 Days Discharge Instructions: Apply over primary dressing as directed. Secondary Dressing: Optifoam Non-Adhesive Dressing, 4x4 in (Home Health) 2 x Per Week/30 Days Discharge Instructions: ***APPLY FOAM AT THE BEND OF FOOT AND LEG.***Apply over primary dressing as directed. Compression Wrap: FourPress (4 layer compression wrap) (Home Health) (Generic) 2 x Per Week/30 Days Discharge Instructions: Apply four layer compression as directed. Wound #68 - Lower Leg Wound Laterality: Left, Anterior, Proximal Cleanser: Normal  Saline [facility]) (Generic) 2 x Per Week/30 Days Discharge Instructions: Cleanse the wound with Normal Saline prior to applying a clean dressing using gauze sponges, not tissue or cotton balls. Peri-Wound Care: Triamcinolone 15 (g) 2 x Per Week/30 Days Discharge Instructions: Use triamcinolone 15 (g) as directed Peri-Wound Care: Sween Lotion (Moisturizing lotion) (Home Health) (Generic) 2 x Per Week/30 Days Discharge Instructions: Apply moisturizing lotion as directed Prim Dressing: KerraCel Ag Gelling Fiber Dressing, 4x5 in (silver alginate) (Home Health) (Generic) 2 x Per Week/30 Days ary Discharge Instructions: Apply silver alginate to wound bed as instructed Secondary Dressing: Woven Gauze Sponge, Non-Sterile 4x4 in (Home Health) (Generic) 2 x Per Week/30 Days Discharge Instructions: Apply over primary dressing as directed. Secondary Dressing: ABD Pad, 5x9 (Home Health) 2 x Per Week/30 Days Discharge Instructions: Apply over primary dressing as directed. Compression Wrap: FourPress (4 layer compression wrap) (Home Health) (Generic) 2 x Per Week/30 Days Discharge Instructions: Apply four layer compression as directed. Wound #73 - Lower Leg Wound Laterality: Right, Lateral Cleanser: Normal Saline (Home  Health) (Generic) 2 x Per Week/30 Days Discharge Instructions: Cleanse the wound with Normal Saline prior to applying a clean dressing using gauze sponges, not tissue or cotton balls. Peri-Wound Care: Triamcinolone 15 (g) 2 x Per Week/30 Days Discharge Instructions: Use triamcinolone 15 (g) as directed Peri-Wound Care: Sween Lotion (Moisturizing lotion) (Home Health) (Generic) 2 x Per Week/30 Days Discharge Instructions: Apply moisturizing lotion as directed Prim Dressing: KerraCel Ag Gelling Fiber Dressing, 4x5 in (silver alginate) (Home Health) (Generic) 2 x Per Week/30 Days ary Discharge Instructions: Apply silver alginate to wound bed as instructed Secondary Dressing: Woven Gauze Sponge, Non-Sterile 4x4 in (Home Health) (Generic) 2 x Per Week/30 Days Discharge Instructions: Apply over primary dressing as directed. Secondary Dressing: ABD Pad, 5x9 (Home Health) 2 x Per Week/30 Days Discharge Instructions: Apply over primary dressing as directed. Compression Wrap: FourPress (4 layer compression wrap) (Home Health) (Generic) 2 x Per Week/30 Days Discharge Instructions: Apply four layer compression as directed. Wound #77 - Lower Leg Wound Laterality: Left, Lateral, Proximal Cleanser: Normal Saline (Home Health) (Generic) 2 x Per Week/30 Days Discharge Instructions: Cleanse the wound with Normal Saline prior to applying a clean dressing using gauze sponges, not tissue or cotton balls. Peri-Wound Care: Triamcinolone 15 (g) 2 x Per Week/30 Days Discharge Instructions: Use triamcinolone 15 (g) as directed Peri-Wound Care: Sween Lotion (Moisturizing lotion) (Home Health) (Generic) 2 x Per Week/30 Days Discharge Instructions: Apply moisturizing lotion as directed Prim Dressing: KerraCel Ag Gelling Fiber Dressing, 4x5 in (silver alginate) (Home Health) (Generic) 2 x Per Week/30 Days ary Discharge Instructions: Apply silver alginate to wound bed as instructed Secondary Dressing: Woven Gauze Sponge,  Non-Sterile 4x4 in (Home Health) (Generic) 2 x Per Week/30 Days Discharge Instructions: Apply over primary dressing as directed. Secondary Dressing: ABD Pad, 5x9 (Home Health) 2 x Per Week/30 Days Discharge Instructions: Apply over primary dressing as directed. Compression Wrap: FourPress (4 layer compression wrap) (Home Health) (Generic) 2 x Per Week/30 Days Discharge Instructions: Apply four layer compression as directed. Wound #78 - Lower Leg Wound Laterality: Right, Posterior Cleanser: Normal Saline (Home Health) (Generic) 2 x Per Week/30 Days Discharge Instructions: Cleanse the wound with Normal Saline prior to applying a clean dressing using gauze sponges, not tissue or cotton balls. Peri-Wound Care: Triamcinolone 15 (g) 2 x Per Week/30 Days Discharge Instructions: Use triamcinolone 15 (g) as directed Peri-Wound Care: Sween Lotion (Moisturizing lotion) (Home Health) (Generic) 2 x Per Week/30 Days  Discharge Instructions: Apply moisturizing lotion as directed Prim Dressing: KerraCel Ag Gelling Fiber Dressing, 4x5 in (silver alginate) (Home Health) (Generic) 2 x Per Week/30 Days ary Discharge Instructions: Apply silver alginate to wound bed as instructed Secondary Dressing: Woven Gauze Sponge, Non-Sterile 4x4 in (Home Health) (Generic) 2 x Per Week/30 Days Discharge Instructions: Apply over primary dressing as directed. Secondary Dressing: ABD Pad, 5x9 (Home Health) 2 x Per Week/30 Days Discharge Instructions: Apply over primary dressing as directed. Compression Wrap: FourPress (4 layer compression wrap) (Home Health) (Generic) 2 x Per Week/30 Days Discharge Instructions: Apply four layer compression as directed. Patient Medications llergies: No Known Allergies A Notifications Medication Indication Start End 03/15/2021 Keflex DOSE 1 - oral 500 mg capsule - 1 capsule twice daily for 7 days Electronic Signature(s) Signed: 03/15/2021 1:21:42 PM By: Kalman Shan DO Entered By:  Kalman Shan on 03/15/2021 13:21:39 -------------------------------------------------------------------------------- Problem List Details Patient Name: Date of Service: [name] [name], [name] 03/15/2021 12:30 PM Medical Record Number: [id number] Patient Account Number: [id number] Date of Birth/Sex: Treating RN: [birth year] (65 y.o. Lorette Ang, Meta.Reding Primary Care Provider: [name] Other Clinician: Referring Provider: Treating Provider/Extender: Quentin Cornwall in Treatment: 68 Active Problems ICD-10 Encounter Code Description Active Date MDM Diagnosis I87.333 Chronic venous hypertension (idiopathic) with ulcer and inflammation of 11/25/2019 No Yes bilateral lower extremity I89.0 Lymphedema, not elsewhere classified 11/25/2019 No Yes L97.821 Non-pressure chronic ulcer of other part of left lower leg limited to breakdown 11/25/2019 No Yes of skin L97.811 Non-pressure chronic ulcer of other part of right lower leg limited to breakdown 11/25/2019 No Yes of skin Inactive Problems ICD-10 Code Description Active Date Inactive Date L03.115 Cellulitis of right lower limb 03/09/2020 03/09/2020 Resolved Problems Electronic Signature(s) Signed: 03/15/2021 1:24:10 PM By: Kalman Shan DO Entered By: Kalman Shan on 03/15/2021 13:12:14 -------------------------------------------------------------------------------- Progress Note Details Patient Name: Date of Service: [name] [name], [name] 03/15/2021 12:30 PM Medical Record Number: [id number] Patient Account Number: [id number] Date of Birth/Sex: Treating RN: 1955-11-03 (65 y.o. [name] [name] Primary Care Provider: [name] Other Clinician: Referring Provider: Treating Provider/Extender: Quentin Cornwall in Treatment: 78 Subjective Chief Complaint Information obtained from Patient Chronic venous hypertension with ulcer and inflammation, lymphedema 06/13/2019; patient returns to clinic with wound  areas on his bilateral lower extremities 11/25/2019; patient returns to clinic with the usual innumerable small wounds on his bilateral lower extremities in the setting of chronic venous inflammation and lymphedema History of Present Illness (HPI) The following HPI elements were documented for the patient's wound: Location: both legs Quality: Patient reports No Pain. long history of chronic venous hypertension,chf,morbid obesity. s/p gsv ablation. cva 2oo4. sleep apnea andhbp. breakdown of skin both legs around 2 months ago. treated here for this in 2015. no .dm. 05/09/2016 -- he had his arterial studies done last week and his right ABI was 1.3 and his left ABI was 1.4. His right toe brachial index was 0.98 and on the left was 0.9. Venous studies have only be done today and reports are awaited. 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 Donnetta Hutching. He had a arterial study done which showed a right ABI of 1.3 which is within normal limits at rest and a left ABI of 1.4 which is within normal limits at rest and may be falsely elevated. The right toe brachial  index was 0.98 and the left toe brachial index was 0.90 05/30/2016 -- seen by Dr. Althea Charon -- is known to have a prior laser ablation of his left great saphenous vein in 2009. Prior to that he had 2 ablations of the odd same vein by interventional radiology. As noted to have severe venous hypertension bilaterally. 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 His right great saphenous vein is somewhat dilated but no evidence of reflux. The only deep venous reflux demonstrated was in his left common femoral vein. After every consideration Dr. Donnetta Hutching recommended reattempt at ablation versus removal of his left great  saphenous vein in the operating room with the standard vein stripping technique. The patient would consider this and let him know again. 06/20/16 patient continues to wear a juxtalite on the right leg without any open areas. On the lateral aspect of his left leg he has 4 wounds and a small area on the medial area of the left leg. Using silver alginate under Profore 06/27/16 still no open areas on the right leg. On the lateral aspect of his left leg he has 4 wounds which continue to have a nonviable surface and wheeze been using silver alginate under Profore 07/04/2016 -- patient hasn't yet to contact his vascular surgeon regarding plans for surgical intervention and I believe he is trying his best to avoid surgery. I have again discussed with him the futility of trying to heal this and keep it healed, if he does not agree to surgical intervention 07/11/2016 -- the patient has not had any juxta light ordered for at least 3 years and we will order him a pair. 08/08/2016 -- he has been approved for Apligraf and they will get this ready for him next week 08/15/2016 -- he has his first Apligraf applied today 08/29/2016 -- he has had his second application of Apligraf today 09/12/2016 -- his Apligraf has not arrived today due to the holiday 09/19/2016 -- he has had his third application of Apligraf today 10/03/2016 -- he has had his fourth application of Apligraf today. 10/18/2016 -- his next Apligraf has not arrived today. He has had chronic problems with his back and was to start on steroids and I have told him there are no objections against this. He is also taking appropriate medications as per his orthopedic doctor. 10/24/2016 -- he is here for his fifth application of Apligraf today. 01/09/2017 -- is been having repeated falls and problems with his back and saw a spine surgeon who has recommended holding his anticoagulation and will have some epidural injections in a few weeks. 03/13/2017 - he had  been doing very well with his left lower extremity and the ulcerations that come down significantly. However last week he may have hit himself against a metal cabinet and has started having abrasions and because of this has started weeping from the right lateral calf. He has not used his compression since morning and his right lower extremity has markedly increased and lymphedema 03/27/17; the patient appears to be doing very well only a small cluster of wounds on the right lateral lower extremity. Most of the areas on his left anterior and left posterior leg are closed the wrong way to closing. His compression slipped down today there is irritation where the wrap edge was but no evidence of infection 04/24/2017 -- the patient has been using his lymphedema pumps and is also wearing his new compression on the right  lower extremity. 05/01/2017 -- he has begun using his lymphedema pumps for a longer period of time but unfortunately had a fall and may have bruised his left lateral lower extremity under the 4-layer compression wrap and has multiple ulcerations in this area today 06/05/2017 -- after examination today he is noted to have taken a significant turn for the worse with multiple open ulcerations on his left lower calf and anterior leg. Lymphedema is better controlled and there is no evidence of cellulitis. I believe the patient is not being compliant with his lymphedema pumps. 06/12/2017 -- the patient did not come for his nurse visit change to the left lower extremity on Friday, as advised. He has not been doing his compression appropriately and now has developed a ulcerated area on the right lower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 patient presents for 1 month follow-up. He has been tolerating compression wraps well. He states he developed a wound to the anterior part of his ankle due to the positioning of the padding placed by the home health nurse rubbing against the area. He states he noticed this getting worse in the last 2 weeks but this has since been corrected. He reports increased soreness to the specific area. Overall he is doing well. Patient History Information obtained from Patient. Family History Cancer - Mother, Diabetes - Paternal Grandparents, Heart Disease - Siblings, Hypertension - Mother,Father,Siblings, Stroke - Paternal Grandparents. Social History Never smoker, Marital Status - Married, Alcohol Use - Never, Drug Use - No History, Caffeine Use - Daily - SODA. Medical History Eyes Denies history of Cataracts, Glaucoma, Optic Neuritis Ear/Nose/Mouth/Throat Denies history of Chronic sinus problems/congestion, Middle ear problems Hematologic/Lymphatic Patient has history of Lymphedema - bilateral lower legs Denies history of Anemia, Hemophilia, Human Immunodeficiency Virus Respiratory Patient has history of Sleep Apnea - obstructive Denies history of Aspiration, Asthma, Chronic Obstructive Pulmonary Disease (COPD), Tuberculosis Cardiovascular Patient has history of Congestive Heart Failure - diastolic, Hypertension, Peripheral Venous Disease Denies history of Angina, Arrhythmia, Coronary Artery Disease, Deep Vein Thrombosis, Hypotension, Myocardial Infarction, Peripheral Arterial  Disease, Phlebitis, Vasculitis Gastrointestinal Denies history of Cirrhosis , Colitis, Crohnoos, Hepatitis A, Hepatitis B, Hepatitis C Endocrine Denies history of Type I Diabetes, Type II Diabetes Genitourinary Denies history of End Stage Renal Disease Immunological Denies history of Lupus Erythematosus, Raynaudoos, Scleroderma Integumentary (Skin) Denies history of History of Burn Musculoskeletal Patient has history of Osteoarthritis Denies history of Gout, Rheumatoid Arthritis, Osteomyelitis Neurologic Denies history of Dementia, Neuropathy, Quadriplegia, Paraplegia Oncologic Denies history of Received Chemotherapy, Received Radiation Psychiatric Denies history of Anorexia/bulimia Hospitalization/Surgery History - kidney stones. - stroke. - stroke/TIA. Medical A Surgical History Notes nd Constitutional Symptoms (General Health) h/o open leg wound , h/o CVA , TIA , varicose veins Respiratory asbestosis Genitourinary nephrolithiasis Objective Constitutional respirations regular, non-labored and within target range for patient.. Vitals Time Taken: 12:40 PM, Height: 70 in, Weight: 360 lbs, BMI: 51.6, Temperature: 97.6 F, Pulse: 68 bpm, Respiratory Rate: 22 breaths/min, Blood Pressure: 188/86 mmHg. Psychiatric pleasant and cooperative. General Notes: Bilateral lower extremities: Numerous scattered open wounds limited to skin breakdown. Swelling is greater in the left versus right leg. There is 3+ pitting edema to the knees. No signs of infection. Chronic skin changes suggesting venous stasis and lymphedema. Integumentary (Hair, Skin) Wound #65 status is Open. Original cause of wound was Gradually Appeared. The date acquired was: 09/08/2019. The wound has been in treatment 68 weeks. The wound is located on the [facility] Lower Leg. The wound measures 0.6cm length x 1.1cm width x 0.2cm depth;  0.518cm^2 area and 0.104cm^3 volume. There is Fat Layer (Subcutaneous  Tissue) exposed. There is no tunneling or undermining noted. There is a medium amount of serosanguineous drainage noted. The wound margin is distinct with the outline attached to the wound base. There is small (1-33%) pink granulation within the wound bed. There is a large (67-100%) amount of necrotic tissue within the wound bed including Adherent Slough. Wound #68 status is Open. Original cause of wound was Gradually Appeared. The date acquired was: 03/23/2020. The wound has been in treatment 51 weeks. The wound is located on the Left,Proximal,Anterior Lower Leg. The wound measures 0.1cm length x 0.1cm width x 0.1cm depth; 0.008cm^2 area and 0.001cm^3 volume. There is Fat Layer (Subcutaneous Tissue) exposed. There is no tunneling noted. There is a small amount of serosanguineous drainage noted. The wound margin is distinct with the outline attached to the wound base. There is large (67-100%) red granulation within the wound bed. There is no necrotic tissue within the wound bed. Wound #73 status is Open. Original cause of wound was Gradually Appeared. The date acquired was: 08/03/2020. The wound has been in treatment 30 weeks. The wound is located on the Right,Lateral Lower Leg. The wound measures 2.7cm length x 1.4cm width x 0.1cm depth; 2.969cm^2 area and 0.297cm^3 volume. There is Fat Layer (Subcutaneous Tissue) exposed. There is no tunneling or undermining noted. There is a medium amount of serosanguineous drainage noted. The wound margin is distinct with the outline attached to the wound base. There is large (67-100%) red granulation within the wound bed. There is no necrotic tissue within the wound bed. Wound #74 status is Healed - Epithelialized. Original cause of wound was Gradually Appeared. The date acquired was: 09/28/2020. The wound has been in treatment 24 weeks. The wound is located on the Left,Lateral Lower Leg. The wound measures 0cm length x 0cm width x 0cm depth; 0cm^2 area and  0cm^3 volume. There is no tunneling or undermining noted. There is a none present amount of drainage noted. The wound margin is distinct with the outline attached to the wound base. There is no granulation within the wound bed. There is no necrotic tissue within the wound bed. Wound #77 status is Open. Original cause of wound was Gradually Appeared. The date acquired was: 11/16/2020. The wound has been in treatment 17 weeks. The wound is located on the Left,Proximal,Lateral Lower Leg. The wound measures 0.5cm length x 0.3cm width x 0.1cm depth; 0.118cm^2 area and 0.012cm^3 volume. There is Fat Layer (Subcutaneous Tissue) exposed. There is no tunneling or undermining noted. There is a medium amount of serosanguineous drainage noted. The wound margin is distinct with the outline attached to the wound base. There is large (67-100%) red granulation within the wound bed. There is no necrotic tissue within the wound bed. Wound #78 status is Open. Original cause of wound was Gradually Appeared. The date acquired was: 11/16/2020. The wound has been in treatment 17 weeks. The wound is located on the Right,Posterior Lower Leg. The wound measures 0.5cm length x 0.4cm width x 0.1cm depth; 0.157cm^2 area and 0.016cm^3 volume. There is Fat Layer (Subcutaneous Tissue) exposed. There is no tunneling or undermining noted. There is a medium amount of serosanguineous drainage noted. The wound margin is distinct with the outline attached to the wound base. There is large (67-100%) red granulation within the wound bed. There is no necrotic tissue within the wound bed. 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 This wound is irritated due to the location and previous padding rubbing against it. We will apply the appropriate padding to this area today. I will do a course of Keflex to cover for possible soft tissue infection. There is no purulent drainage or increased warmth to the area. I suspect he will do fine with just a course of Keflex but I told him to call our clinic if this were to worsen and we can see him sooner. We will continue with silver alginate and 4-layer compression. This is changed with home health and we will see him in 1 month. Procedures Wound #65 Pre-procedure diagnosis of Wound #65 is a Venous Leg Ulcer located on the Left,Distal,Anterior Lower Leg . There was a Four Layer Compression Therapy Procedure by Deon Pilling, RN. Post procedure Diagnosis Wound #65: Same as Pre-Procedure Wound #68 Pre-procedure diagnosis of Wound #68 is a Lymphedema located on the Left,Proximal,Anterior Lower Leg . There was a Four Layer Compression Therapy Procedure by Deon Pilling, RN. Post procedure Diagnosis Wound #68: Same as Pre-Procedure Wound #73 Pre-procedure diagnosis of Wound #73 is a Lymphedema located on the Right,Lateral Lower Leg . There was a Four Layer Compression Therapy Procedure by Deon Pilling, RN. Post procedure Diagnosis Wound #73: Same as Pre-Procedure Wound #77 Pre-procedure diagnosis of Wound #77 is a Lymphedema located on the Left,Proximal,Lateral Lower Leg . There was a Four Layer Compression Therapy Procedure by Deon Pilling, RN. Post procedure Diagnosis Wound #77: Same as Pre-Procedure Wound #78 Pre-procedure diagnosis of Wound #78 is a Lymphedema located on the Right,Posterior Lower Leg . There was a Four Layer Compression Therapy Procedure by Deon Pilling, RN. Post procedure Diagnosis Wound #78: Same as Pre-Procedure Plan Follow-up Appointments: Return appointment in 1 month. - DR.  Dellia Nims Bathing/ Shower/ Hygiene: May shower with protection but do not get wound dressing(s) wet. Edema Control - Lymphedema / SCD / Other: Lymphedema Pumps. Use Lymphedema pumps on leg(s) 2-3 times a day for 45-60 minutes. If wearing any wraps or hose, do not remove them. Continue exercising as instructed. Elevate legs to the level of the heart or above for 30 minutes daily and/or when sitting, a frequency of: Avoid standing for long periods of time. Exercise regularly Additional Orders / Instructions: Other: - Patient to pick antibiotics at pharmacy today. Home Health: No change in wound care orders this week; continue Home Health for wound care. May utilize formulary equivalent dressing for wound treatment orders unless otherwise specified. - home health to change 2x a week {if possible Tuesday's and Friday's} Other Home Health Orders/Instructions: - [facility] The following medication(s) was prescribed: Keflex oral 500 mg capsule 1 1 capsule twice daily for 7 days starting 03/15/2021 WOUND #65: - Lower Leg Wound Laterality: Left, Anterior, Distal Cleanser: Normal Saline ([place]) (Generic) 2 x Per Week/30 Days Discharge Instructions: Cleanse the wound with Normal Saline prior to applying a clean dressing using gauze sponges, not tissue or cotton balls. Peri-Wound Care: Triamcinolone 15 (g) 2 x Per Week/30 Days Discharge Instructions: Use triamcinolone 15 (g) as directed Peri-Wound Care: Sween Lotion (Moisturizing lotion) (Home Health) (Generic) 2 x Per Week/30 Days Discharge Instructions: Apply moisturizing lotion as directed Prim Dressing: KerraCel Ag Gelling Fiber Dressing,  4x5 in (silver alginate) (Home Health) (Generic) 2 x Per Week/30 Days ary Discharge Instructions: Apply silver alginate to wound bed as instructed Secondary Dressing: Woven Gauze Sponge, Non-Sterile 4x4 in (Home Health) (Generic) 2 x Per Week/30 Days Discharge Instructions: Apply over primary dressing as  directed. Secondary Dressing: ABD Pad, 5x9 (Home Health) 2 x Per Week/30 Days Discharge Instructions: Apply over primary dressing as directed. Secondary Dressing: Optifoam Non-Adhesive Dressing, 4x4 in (Home Health) 2 x Per Week/30 Days Discharge Instructions: ***APPLY FOAM AT THE BEND OF FOOT AND LEG.***Apply over primary dressing as directed. Com pression Wrap: FourPress (4 layer compression wrap) (Home Health) (Generic) 2 x Per Week/30 Days Discharge Instructions: Apply four layer compression as directed. WOUND #68: - Lower Leg Wound Laterality: Left, Anterior, Proximal Cleanser: Normal Saline (Home Health) (Generic) 2 x Per Week/30 Days Discharge Instructions: Cleanse the wound with Normal Saline prior to applying a clean dressing using gauze sponges, not tissue or cotton balls. Peri-Wound Care: Triamcinolone 15 (g) 2 x Per Week/30 Days Discharge Instructions: Use triamcinolone 15 (g) as directed Peri-Wound Care: Sween Lotion (Moisturizing lotion) (Home Health) (Generic) 2 x Per Week/30 Days Discharge Instructions: Apply moisturizing lotion as directed Prim Dressing: KerraCel Ag Gelling Fiber Dressing, 4x5 in (silver alginate) (Home Health) (Generic) 2 x Per Week/30 Days ary Discharge Instructions: Apply silver alginate to wound bed as instructed Secondary Dressing: Woven Gauze Sponge, Non-Sterile 4x4 in (Home Health) (Generic) 2 x Per Week/30 Days Discharge Instructions: Apply over primary dressing as directed. Secondary Dressing: ABD Pad, 5x9 (Home Health) 2 x Per Week/30 Days Discharge Instructions: Apply over primary dressing as directed. Com pression Wrap: FourPress (4 layer compression wrap) (Home Health) (Generic) 2 x Per Week/30 Days Discharge Instructions: Apply four layer compression as directed. WOUND #73: - Lower Leg Wound Laterality: Right, Lateral Cleanser: Normal Saline (Home Health) (Generic) 2 x Per Week/30 Days Discharge Instructions: Cleanse the wound with Normal  Saline prior to applying a clean dressing using gauze sponges, not tissue or cotton balls. Peri-Wound Care: Triamcinolone 15 (g) 2 x Per Week/30 Days Discharge Instructions: Use triamcinolone 15 (g) as directed Peri-Wound Care: Sween Lotion (Moisturizing lotion) (Home Health) (Generic) 2 x Per Week/30 Days Discharge Instructions: Apply moisturizing lotion as directed Prim Dressing: KerraCel Ag Gelling Fiber Dressing, 4x5 in (silver alginate) (Home Health) (Generic) 2 x Per Week/30 Days ary Discharge Instructions: Apply silver alginate to wound bed as instructed Secondary Dressing: Woven Gauze Sponge, Non-Sterile 4x4 in (Home Health) (Generic) 2 x Per Week/30 Days Discharge Instructions: Apply over primary dressing as directed. Secondary Dressing: ABD Pad, 5x9 (Home Health) 2 x Per Week/30 Days Discharge Instructions: Apply over primary dressing as directed. Com pression Wrap: FourPress (4 layer compression wrap) (Home Health) (Generic) 2 x Per Week/30 Days Discharge Instructions: Apply four layer compression as directed. WOUND #77: - Lower Leg Wound Laterality: Left, Lateral, Proximal Cleanser: Normal Saline (Home Health) (Generic) 2 x Per Week/30 Days Discharge Instructions: Cleanse the wound with Normal Saline prior to applying a clean dressing using gauze sponges, not tissue or cotton balls. Peri-Wound Care: Triamcinolone 15 (g) 2 x Per Week/30 Days Discharge Instructions: Use triamcinolone 15 (g) as directed Peri-Wound Care: Sween Lotion (Moisturizing lotion) (Home Health) (Generic) 2 x Per Week/30 Days Discharge Instructions: Apply moisturizing lotion as directed Prim Dressing: KerraCel Ag Gelling Fiber Dressing, 4x5 in (silver alginate) (Home Health) (Generic) 2 x Per Week/30 Days ary Discharge Instructions: Apply silver alginate to wound bed as instructed Secondary Dressing: Woven Gauze Sponge, Non-Sterile  4x4 in [facility]) (Generic) 2 x Per Week/30 Days Discharge Instructions:  Apply over primary dressing as directed. Secondary Dressing: ABD Pad, 5x9 (Home Health) 2 x Per Week/30 Days Discharge Instructions: Apply over primary dressing as directed. Com pression Wrap: FourPress (4 layer compression wrap) (Home Health) (Generic) 2 x Per Week/30 Days Discharge Instructions: Apply four layer compression as directed. WOUND #78: - Lower Leg Wound Laterality: Right, Posterior Cleanser: Normal Saline (Home Health) (Generic) 2 x Per Week/30 Days Discharge Instructions: Cleanse the wound with Normal Saline prior to applying a clean dressing using gauze sponges, not tissue or cotton balls. Peri-Wound Care: Triamcinolone 15 (g) 2 x Per Week/30 Days Discharge Instructions: Use triamcinolone 15 (g) as directed Peri-Wound Care: Sween Lotion (Moisturizing lotion) (Home Health) (Generic) 2 x Per Week/30 Days Discharge Instructions: Apply moisturizing lotion as directed Prim Dressing: KerraCel Ag Gelling Fiber Dressing, 4x5 in (silver alginate) (Home Health) (Generic) 2 x Per Week/30 Days ary Discharge Instructions: Apply silver alginate to wound bed as instructed Secondary Dressing: Woven Gauze Sponge, Non-Sterile 4x4 in (Home Health) (Generic) 2 x Per Week/30 Days Discharge Instructions: Apply over primary dressing as directed. Secondary Dressing: ABD Pad, 5x9 (Home Health) 2 x Per Week/30 Days Discharge Instructions: Apply over primary dressing as directed. Com pression Wrap: FourPress (4 layer compression wrap) (Home Health) (Generic) 2 x Per Week/30 Days Discharge Instructions: Apply four layer compression as directed. 1. Keflex 2. Silver alginate with 4 layer compression 3. Follow-up in 1 month Electronic Signature(s) Signed: 03/15/2021 1:24:10 PM By: Kalman Shan DO Entered By: Kalman Shan on 03/15/2021 13:23:41 -------------------------------------------------------------------------------- HxROS Details Patient Name: Date of Service: [name] [name], HA Simla W.  03/15/2021 12:30 PM Medical Record Number: [id number] Patient Account Number: [id number] Date of Birth/Sex: Treating RN: 18-Dec-1955 (65 y.o. [name] [name] Primary Care Provider: [name] Other Clinician: Referring Provider: Treating Provider/Extender: Quentin Cornwall in Treatment: 95 Information Obtained From Patient Constitutional Symptoms (General Health) Medical History: Past Medical History Notes: h/o open leg wound , h/o CVA , TIA , varicose veins Eyes Medical History: Negative for: Cataracts; Glaucoma; Optic Neuritis Ear/Nose/Mouth/Throat Medical History: Negative for: Chronic sinus problems/congestion; Middle ear problems Hematologic/Lymphatic Medical History: Positive for: Lymphedema - bilateral lower legs Negative for: Anemia; Hemophilia; Human Immunodeficiency Virus Respiratory Medical History: Positive for: Sleep Apnea - obstructive Negative for: Aspiration; Asthma; Chronic Obstructive Pulmonary Disease (COPD); Tuberculosis Past Medical History Notes: asbestosis Cardiovascular Medical History: Positive for: Congestive Heart Failure - diastolic; Hypertension; Peripheral Venous Disease Negative for: Angina; Arrhythmia; Coronary Artery Disease; Deep Vein Thrombosis; Hypotension; Myocardial Infarction; Peripheral Arterial Disease; Phlebitis; Vasculitis Gastrointestinal Medical History: Negative for: Cirrhosis ; Colitis; Crohns; Hepatitis A; Hepatitis B; Hepatitis C Endocrine Medical History: Negative for: Type I Diabetes; Type II Diabetes Genitourinary Medical History: Negative for: End Stage Renal Disease Past Medical History Notes: nephrolithiasis Immunological Medical History: Negative for: Lupus Erythematosus; Raynauds; Scleroderma Integumentary (Skin) Medical History: Negative for: History of Burn Musculoskeletal Medical History: Positive for: Osteoarthritis Negative for: Gout; Rheumatoid Arthritis;  Osteomyelitis Neurologic Medical History: Negative for: Dementia; Neuropathy; Quadriplegia; Paraplegia Oncologic Medical History: Negative for: Received Chemotherapy; Received Radiation Psychiatric Medical History: Negative for: Anorexia/bulimia Immunizations Pneumococcal Vaccine: Received Pneumococcal Vaccination: No Implantable Devices No devices added Hospitalization / Surgery History Type of Hospitalization/Surgery kidney stones stroke stroke/TIA Family and Social History Cancer: Yes - Mother; Diabetes: Yes - Paternal Grandparents; Heart Disease: Yes - Siblings; Hypertension: Yes - Mother,Father,Siblings; Stroke: Yes - Paternal Grandparents; Never smoker; Marital Status - Married; Alcohol Use:  Never; Drug Use: No History; Caffeine Use: Daily - SODA; Financial Concerns: No; Food, Clothing or Shelter Needs: No; Support System Lacking: No; Transportation Concerns: No Electronic Signature(s) Signed: 03/15/2021 1:24:10 PM By: Kalman Shan DO Signed: 03/15/2021 4:38:16 PM By: Deon Pilling Entered By: Kalman Shan on 03/15/2021 13:15:29 -------------------------------------------------------------------------------- SuperBill Details Patient Name: Date of Service: [name] [name], [name] 03/15/2021 Medical Record Number: [id number] Patient Account Number: [id number] Date of Birth/Sex: Treating RN: 04-May-1956 (65 y.o. [name] [name] Primary Care Provider: [name] Other Clinician: Referring Provider: Treating Provider/Extender: Quentin Cornwall in Treatment: 68 Diagnosis Coding ICD-10 Codes Code Description [phone] Chronic venous hypertension (idiopathic) with ulcer and inflammation of bilateral lower extremity I89.0 Lymphedema, not elsewhere classified L97.821 Non-pressure chronic ulcer of other part of left lower leg limited to breakdown of skin L97.811 Non-pressure chronic ulcer of other part of right lower leg limited to breakdown of  skin Facility Procedures CPT4: Code [id number] Description: 81 BILATERAL: Application of multi-layer venous compression system; leg (below knee), including ankle and t. Modifier: Quantity: 1 Physician Procedures : CPT4 Code Description Modifier [id number] - WC PHYS LEVEL 4 - EST PT ICD-10 Diagnosis Description I87.333 Chronic venous hypertension (idiopathic) with ulcer and inflammation of bilateral lower extremity I89.0 Lymphedema, not elsewhere classified  L97.821 Non-pressure chronic ulcer of other part of left lower leg limited to breakdown of skin L97.811 Non-pressure chronic ulcer of other part of right lower leg limited to breakdown of skin Quantity: 1 Electronic Signature(s) Signed: 03/15/2021 1:24:10 PM By: Kalman Shan DO Entered By: Kalman Shan on 03/15/2021 13:23:49

## 2021-03-19 DIAGNOSIS — G4733 Obstructive sleep apnea (adult) (pediatric): Secondary | ICD-10-CM | POA: Diagnosis not present

## 2021-03-19 DIAGNOSIS — I87333 Chronic venous hypertension (idiopathic) with ulcer and inflammation of bilateral lower extremity: Secondary | ICD-10-CM | POA: Diagnosis not present

## 2021-03-19 DIAGNOSIS — I110 Hypertensive heart disease with heart failure: Secondary | ICD-10-CM | POA: Diagnosis not present

## 2021-03-19 DIAGNOSIS — L97811 Non-pressure chronic ulcer of other part of right lower leg limited to breakdown of skin: Secondary | ICD-10-CM | POA: Diagnosis not present

## 2021-03-19 DIAGNOSIS — L97821 Non-pressure chronic ulcer of other part of left lower leg limited to breakdown of skin: Secondary | ICD-10-CM | POA: Diagnosis not present

## 2021-03-19 DIAGNOSIS — I5032 Chronic diastolic (congestive) heart failure: Secondary | ICD-10-CM | POA: Diagnosis not present

## 2021-03-21 DIAGNOSIS — I5032 Chronic diastolic (congestive) heart failure: Secondary | ICD-10-CM | POA: Diagnosis not present

## 2021-03-21 DIAGNOSIS — I110 Hypertensive heart disease with heart failure: Secondary | ICD-10-CM | POA: Diagnosis not present

## 2021-03-21 DIAGNOSIS — L97811 Non-pressure chronic ulcer of other part of right lower leg limited to breakdown of skin: Secondary | ICD-10-CM | POA: Diagnosis not present

## 2021-03-21 DIAGNOSIS — L97821 Non-pressure chronic ulcer of other part of left lower leg limited to breakdown of skin: Secondary | ICD-10-CM | POA: Diagnosis not present

## 2021-03-21 DIAGNOSIS — I87333 Chronic venous hypertension (idiopathic) with ulcer and inflammation of bilateral lower extremity: Secondary | ICD-10-CM | POA: Diagnosis not present

## 2021-03-21 DIAGNOSIS — G4733 Obstructive sleep apnea (adult) (pediatric): Secondary | ICD-10-CM | POA: Diagnosis not present

## 2021-03-24 DIAGNOSIS — L97821 Non-pressure chronic ulcer of other part of left lower leg limited to breakdown of skin: Secondary | ICD-10-CM | POA: Diagnosis not present

## 2021-03-24 DIAGNOSIS — G4733 Obstructive sleep apnea (adult) (pediatric): Secondary | ICD-10-CM | POA: Diagnosis not present

## 2021-03-24 DIAGNOSIS — I110 Hypertensive heart disease with heart failure: Secondary | ICD-10-CM | POA: Diagnosis not present

## 2021-03-24 DIAGNOSIS — I5032 Chronic diastolic (congestive) heart failure: Secondary | ICD-10-CM | POA: Diagnosis not present

## 2021-03-24 DIAGNOSIS — I87333 Chronic venous hypertension (idiopathic) with ulcer and inflammation of bilateral lower extremity: Secondary | ICD-10-CM | POA: Diagnosis not present

## 2021-03-24 DIAGNOSIS — L97811 Non-pressure chronic ulcer of other part of right lower leg limited to breakdown of skin: Secondary | ICD-10-CM | POA: Diagnosis not present

## 2021-03-29 DIAGNOSIS — I5032 Chronic diastolic (congestive) heart failure: Secondary | ICD-10-CM | POA: Diagnosis not present

## 2021-03-29 DIAGNOSIS — L97821 Non-pressure chronic ulcer of other part of left lower leg limited to breakdown of skin: Secondary | ICD-10-CM | POA: Diagnosis not present

## 2021-03-29 DIAGNOSIS — G4733 Obstructive sleep apnea (adult) (pediatric): Secondary | ICD-10-CM | POA: Diagnosis not present

## 2021-03-29 DIAGNOSIS — I87333 Chronic venous hypertension (idiopathic) with ulcer and inflammation of bilateral lower extremity: Secondary | ICD-10-CM | POA: Diagnosis not present

## 2021-03-29 DIAGNOSIS — L97811 Non-pressure chronic ulcer of other part of right lower leg limited to breakdown of skin: Secondary | ICD-10-CM | POA: Diagnosis not present

## 2021-03-29 DIAGNOSIS — I110 Hypertensive heart disease with heart failure: Secondary | ICD-10-CM | POA: Diagnosis not present

## 2021-03-31 DIAGNOSIS — E7801 Familial hypercholesterolemia: Secondary | ICD-10-CM | POA: Diagnosis not present

## 2021-03-31 DIAGNOSIS — I10 Essential (primary) hypertension: Secondary | ICD-10-CM | POA: Diagnosis not present

## 2021-03-31 DIAGNOSIS — E119 Type 2 diabetes mellitus without complications: Secondary | ICD-10-CM | POA: Diagnosis not present

## 2021-03-31 DIAGNOSIS — E7800 Pure hypercholesterolemia, unspecified: Secondary | ICD-10-CM | POA: Diagnosis not present

## 2021-03-31 DIAGNOSIS — I5030 Unspecified diastolic (congestive) heart failure: Secondary | ICD-10-CM | POA: Diagnosis not present

## 2021-04-02 DIAGNOSIS — I5032 Chronic diastolic (congestive) heart failure: Secondary | ICD-10-CM | POA: Diagnosis not present

## 2021-04-02 DIAGNOSIS — L97821 Non-pressure chronic ulcer of other part of left lower leg limited to breakdown of skin: Secondary | ICD-10-CM | POA: Diagnosis not present

## 2021-04-02 DIAGNOSIS — I87333 Chronic venous hypertension (idiopathic) with ulcer and inflammation of bilateral lower extremity: Secondary | ICD-10-CM | POA: Diagnosis not present

## 2021-04-02 DIAGNOSIS — G4733 Obstructive sleep apnea (adult) (pediatric): Secondary | ICD-10-CM | POA: Diagnosis not present

## 2021-04-02 DIAGNOSIS — L97811 Non-pressure chronic ulcer of other part of right lower leg limited to breakdown of skin: Secondary | ICD-10-CM | POA: Diagnosis not present

## 2021-04-02 DIAGNOSIS — I110 Hypertensive heart disease with heart failure: Secondary | ICD-10-CM | POA: Diagnosis not present

## 2021-04-04 DIAGNOSIS — Z8673 Personal history of transient ischemic attack (TIA), and cerebral infarction without residual deficits: Secondary | ICD-10-CM | POA: Diagnosis not present

## 2021-04-04 DIAGNOSIS — K4090 Unilateral inguinal hernia, without obstruction or gangrene, not specified as recurrent: Secondary | ICD-10-CM | POA: Diagnosis not present

## 2021-04-04 DIAGNOSIS — I872 Venous insufficiency (chronic) (peripheral): Secondary | ICD-10-CM | POA: Diagnosis not present

## 2021-04-04 DIAGNOSIS — E1121 Type 2 diabetes mellitus with diabetic nephropathy: Secondary | ICD-10-CM | POA: Diagnosis not present

## 2021-04-04 DIAGNOSIS — K429 Umbilical hernia without obstruction or gangrene: Secondary | ICD-10-CM | POA: Diagnosis not present

## 2021-04-04 DIAGNOSIS — E7801 Familial hypercholesterolemia: Secondary | ICD-10-CM | POA: Diagnosis not present

## 2021-04-04 DIAGNOSIS — I10 Essential (primary) hypertension: Secondary | ICD-10-CM | POA: Diagnosis not present

## 2021-04-04 DIAGNOSIS — Z0001 Encounter for general adult medical examination with abnormal findings: Secondary | ICD-10-CM | POA: Diagnosis not present

## 2021-04-05 DIAGNOSIS — L97811 Non-pressure chronic ulcer of other part of right lower leg limited to breakdown of skin: Secondary | ICD-10-CM | POA: Diagnosis not present

## 2021-04-05 DIAGNOSIS — G4733 Obstructive sleep apnea (adult) (pediatric): Secondary | ICD-10-CM | POA: Diagnosis not present

## 2021-04-05 DIAGNOSIS — I87333 Chronic venous hypertension (idiopathic) with ulcer and inflammation of bilateral lower extremity: Secondary | ICD-10-CM | POA: Diagnosis not present

## 2021-04-05 DIAGNOSIS — L97821 Non-pressure chronic ulcer of other part of left lower leg limited to breakdown of skin: Secondary | ICD-10-CM | POA: Diagnosis not present

## 2021-04-05 DIAGNOSIS — I110 Hypertensive heart disease with heart failure: Secondary | ICD-10-CM | POA: Diagnosis not present

## 2021-04-05 DIAGNOSIS — I5032 Chronic diastolic (congestive) heart failure: Secondary | ICD-10-CM | POA: Diagnosis not present

## 2021-04-08 DIAGNOSIS — I87333 Chronic venous hypertension (idiopathic) with ulcer and inflammation of bilateral lower extremity: Secondary | ICD-10-CM | POA: Diagnosis not present

## 2021-04-08 DIAGNOSIS — L97821 Non-pressure chronic ulcer of other part of left lower leg limited to breakdown of skin: Secondary | ICD-10-CM | POA: Diagnosis not present

## 2021-04-08 DIAGNOSIS — I5032 Chronic diastolic (congestive) heart failure: Secondary | ICD-10-CM | POA: Diagnosis not present

## 2021-04-08 DIAGNOSIS — I110 Hypertensive heart disease with heart failure: Secondary | ICD-10-CM | POA: Diagnosis not present

## 2021-04-08 DIAGNOSIS — G4733 Obstructive sleep apnea (adult) (pediatric): Secondary | ICD-10-CM | POA: Diagnosis not present

## 2021-04-08 DIAGNOSIS — L97811 Non-pressure chronic ulcer of other part of right lower leg limited to breakdown of skin: Secondary | ICD-10-CM | POA: Diagnosis not present

## 2021-04-09 ENCOUNTER — Other Ambulatory Visit: Payer: Self-pay

## 2021-04-09 ENCOUNTER — Encounter (HOSPITAL_COMMUNITY): Payer: Self-pay | Admitting: Emergency Medicine

## 2021-04-09 ENCOUNTER — Emergency Department (HOSPITAL_COMMUNITY)
Admission: EM | Admit: 2021-04-09 | Discharge: 2021-04-09 | Disposition: A | Discharge status: Home / Self Care | Payer: Medicare Other | Attending: Emergency Medicine | Admitting: Emergency Medicine

## 2021-04-09 DIAGNOSIS — Z7902 Long term (current) use of antithrombotics/antiplatelets: Secondary | ICD-10-CM | POA: Diagnosis not present

## 2021-04-09 DIAGNOSIS — Z7982 Long term (current) use of aspirin: Secondary | ICD-10-CM | POA: Insufficient documentation

## 2021-04-09 DIAGNOSIS — R600 Localized edema: Secondary | ICD-10-CM | POA: Diagnosis not present

## 2021-04-09 DIAGNOSIS — I110 Hypertensive heart disease with heart failure: Secondary | ICD-10-CM | POA: Diagnosis not present

## 2021-04-09 DIAGNOSIS — Z8616 Personal history of COVID-19: Secondary | ICD-10-CM | POA: Diagnosis not present

## 2021-04-09 DIAGNOSIS — S81802A Unspecified open wound, left lower leg, initial encounter: Secondary | ICD-10-CM | POA: Diagnosis not present

## 2021-04-09 DIAGNOSIS — X58XXXA Exposure to other specified factors, initial encounter: Secondary | ICD-10-CM | POA: Insufficient documentation

## 2021-04-09 DIAGNOSIS — I5032 Chronic diastolic (congestive) heart failure: Secondary | ICD-10-CM | POA: Insufficient documentation

## 2021-04-09 DIAGNOSIS — Z79899 Other long term (current) drug therapy: Secondary | ICD-10-CM | POA: Insufficient documentation

## 2021-04-09 DIAGNOSIS — R609 Edema, unspecified: Secondary | ICD-10-CM

## 2021-04-09 DIAGNOSIS — S81801A Unspecified open wound, right lower leg, initial encounter: Secondary | ICD-10-CM | POA: Diagnosis not present

## 2021-04-09 DIAGNOSIS — Z8673 Personal history of transient ischemic attack (TIA), and cerebral infarction without residual deficits: Secondary | ICD-10-CM | POA: Insufficient documentation

## 2021-04-09 LAB — COMPREHENSIVE METABOLIC PANEL
ALT: 20 U/L (ref 0–44)
AST: 20 U/L (ref 15–41)
Albumin: 3.8 g/dL (ref 3.5–5.0)
Alkaline Phosphatase: 52 U/L (ref 38–126)
Anion gap: 11 (ref 5–15)
BUN: 27 mg/dL — ABNORMAL HIGH (ref 8–23)
CO2: 31 mmol/L (ref 22–32)
Calcium: 9.1 mg/dL (ref 8.9–10.3)
Chloride: 100 mmol/L (ref 98–111)
Creatinine, Ser: 1.31 mg/dL — ABNORMAL HIGH (ref 0.61–1.24)
GFR, Estimated: >60 mL/min (ref >60)
Glucose, Bld: 164 mg/dL — ABNORMAL HIGH (ref 70–99)
Potassium: 3.7 mmol/L (ref 3.5–5.1)
Sodium: 142 mmol/L (ref 135–145)
Total Bilirubin: 0.8 mg/dL (ref 0.3–1.2)
Total Protein: 7.2 g/dL (ref 6.5–8.1)

## 2021-04-09 LAB — CBC WITH DIFFERENTIAL/PLATELET
Abs Immature Granulocytes: 0.03 10*3/uL (ref 0.00–0.07)
Basophils Absolute: 0 10*3/uL (ref 0.0–0.1)
Basophils Relative: 0 %
Eosinophils Absolute: 0.5 10*3/uL (ref 0.0–0.5)
Eosinophils Relative: 6 %
HCT: 44 % (ref 39.0–52.0)
Hemoglobin: 13.3 g/dL (ref 13.0–17.0)
Immature Granulocytes: 0 %
Lymphocytes Relative: 8 %
Lymphs Abs: 0.7 10*3/uL (ref 0.7–4.0)
MCH: 24.3 pg — ABNORMAL LOW (ref 26.0–34.0)
MCHC: 30.2 g/dL (ref 30.0–36.0)
MCV: 80.4 fL (ref 80.0–100.0)
Monocytes Absolute: 0.7 10*3/uL (ref 0.1–1.0)
Monocytes Relative: 9 %
Neutro Abs: 6.3 10*3/uL (ref 1.7–7.7)
Neutrophils Relative %: 77 %
Platelets: 213 10*3/uL (ref 150–400)
RBC: 5.47 MIL/uL (ref 4.22–5.81)
RDW: 18.4 % — ABNORMAL HIGH (ref 11.5–15.5)
WBC: 8.2 10*3/uL (ref 4.0–10.5)
nRBC: 0 % (ref 0.0–0.2)

## 2021-04-09 MED ORDER — HYDRALAZINE HCL 25 MG PO TABS
50.0000 mg | ORAL_TABLET | Freq: Once | ORAL | Status: DC
  Filled 2021-04-09: qty 2

## 2021-04-09 NOTE — Discharge Instructions (Signed)
Continue your current medications.  Follow-up with your doctors at the wound center to be rechecked.  Return to the ED for fevers chills or other concerning symptoms

## 2021-04-09 NOTE — ED Triage Notes (Signed)
Patient states he has open wounds on his legs that are wrapped at home 2x week, when his leg was getting wrapped last night they found 2-3 maggots at the top of his L leg.

## 2021-04-09 NOTE — ED Notes (Signed)
Wound care instructions followed: wounds irrigated nacl  Wounds dried  Non adhesive  dressing applied  Wounds then wrapped in clean gauze wrap  Wrap then covered with curlex per provider order  --------

## 2021-04-09 NOTE — ED Provider Notes (Signed)
Emergency Medicine Provider Triage Evaluation Note  [name] [name] , a 65 y.o. male  was evaluated in triage.  Pt complains of maggots in LLE wound. Patient states that last night when re-wrapping his legs they found a few maggots at the proximal part of his left leg. States he was on abx a few weeks ago. None currently. Home health person comes to house q2 weeks to change dressing.   Review of Systems  Positive: wounds Negative: Fever, vomiting  Physical Exam  BP (!) 184/100 (BP Location: Right Arm)   Pulse 71   Temp 98.2 F (36.8 C) (Oral)   Resp 18   Ht '5\' 10"'$  (1.778 m)   Wt (!) 160.6 kg   SpO2 94%   BMI 50.79 kg/m  Gen:   Awake, no distress   Resp:  Normal effort  MSK:   Les with chronic appearing wounds, erythema present. No obvious active maggots  Medical Decision Making  Medically screening exam initiated at 12:49 PM.  Appropriate orders placed.  [name] [name] was informed that the remainder of the evaluation will be completed by another provider, this initial triage assessment does not replace that evaluation, and the importance of remaining in the ED until their evaluation is complete.  Wounds    Leafy Kindle 04/09/21 1251    Valarie Merino, MD 04/09/21 [phone]

## 2021-04-09 NOTE — ED Provider Notes (Signed)
[facility] DEPT Provider Note   CSN: [id number] Arrival date & time: 04/09/21  1214     History Chief complaint: Possible maggots in his wounds  [name] [name] is a 65 y.o. male.  HPI  Patient states he has a history of chronic leg swelling and wounds.  He is followed at the wound care center.  Patient states he always has drainage from his wounds.  He is followed at the wound care center here at [facility].  Patient also has a home health nurse that comes to help check on his wounds.  Patient states yesterday when they were changing dressings nurse noticed a few maggots on his left leg.  They suggested he come to the emergency room to be evaluated.  Patient did not want to come last night so he came today.  He denies having any increasing pain.  He is not having any fevers or chills.  He is not having any vomiting or diarrhea.  He does not feel like his legs look any different than usual.  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[facility])    Essential hypertension    History of nephrolithiasis    History of open leg wound    History of stroke July 2004   Obstructive sleep apnea    TIA (transient ischemic attack)    Varicose veins    Laser ablation of left great saphenous vein 2009    Patient Active Problem List   Diagnosis Date Noted   Pneumonia due to COVID-19 virus 09/09/2019   Hypokalemia 09/09/2019   Acute respiratory failure with hypoxia ([place]) 09/09/2019   Essential hypertension 10/14/2014   Chronic diastolic heart failure ([place]) 10/14/2014   Morbid obesity ([place]) 07/22/2013   Varicose veins of lower extremities with other complications [id number]   OSA (obstructive sleep apnea) 07/22/2013   Venous stasis ulcers ([place]) 07/22/2013    Past Surgical History:  Procedure Laterality Date   CYSTOSCOPY WITH RETROGRADE PYELOGRAM, URETEROSCOPY AND STENT PLACEMENT Right 08/02/2015   Procedure: CYSTOSCOPY WITH RETROGRADE PYELOGRAM,  URETEROSCOPY , STONE EXTRACTION AND STENT PLACEMENT;  Surgeon: Cleon Gustin, MD;  Location: [facility];  Service: Urology;  Laterality: Right;   HOLMIUM LASER APPLICATION Right [id number]   Procedure: HOLMIUM LASER APPLICATION;  Surgeon: Cleon Gustin, MD;  Location: [facility];  Service: Urology;  Laterality: Right;   KIDNEY STONE SURGERY     VASECTOMY  1984   VEIN SURGERY Left 06/2010   Laser ablation       Family History  Problem Relation Age of Onset   Bone cancer Mother    Hypertension Father    Pancreatic cancer Maternal Grandmother    Stroke Paternal Grandmother    Diabetes Paternal Grandfather    Heart attack Brother    Hypertension Brother    Heart attack Sister     Social History   Tobacco Use   Smoking status: Never   Smokeless tobacco: Never  Substance Use Topics   Alcohol use: No    Alcohol/week: 0.0 standard drinks   Drug use: No    Home Medications Prior to Admission medications   Medication Sig Start Date End Date Taking? Authorizing Provider  acetaminophen (TYLENOL) 500 MG tablet Take 1,000 mg by mouth daily as needed for moderate pain.   Yes [provider]  allopurinol (ZYLOPRIM) 300 MG tablet Take 300 mg by mouth daily.   Yes [provider]  aspirin 325 MG tablet Take 325 mg  by mouth daily.   Yes [provider]  atorvastatin (LIPITOR) 10 MG tablet Take 10 mg by mouth daily.   Yes [provider]  clopidogrel (PLAVIX) 75 MG tablet Take 75 mg by mouth daily.   Yes [provider]  furosemide (LASIX) 40 MG tablet Take 40 mg by mouth every other day.   Yes [provider]  hydrALAZINE (APRESOLINE) 50 MG tablet TAKE 1 TABLET BY MOUTH TWICE A DAY. NEED OFFICE VISIT FOR FURTHER REFILLS Patient taking differently: Take 50 mg by mouth 3 (three) times daily. Increased by Dr. Quintin Alto 03/08/18  Yes Satira Sark, MD  ibuprofen (ADVIL,MOTRIN) 200 MG tablet Take 400-600 mg by mouth daily as needed for  mild pain (pain).   Yes [provider]  metoprolol succinate (TOPROL-XL) 100 MG 24 hr tablet Take 100 mg by mouth daily. Take with or immediately following a meal.   Yes [provider]  olmesartan-hydrochlorothiazide (BENICAR HCT) 40-25 MG tablet Take 1 tablet by mouth daily. 04/22/18  Yes [provider]  aspirin EC 81 MG tablet Take 1 tablet (81 mg total) by mouth daily. Patient not taking: No sig reported 05/10/18   Satira Sark, MD    Allergies    Patient has no known allergies.  Review of Systems   Review of Systems  All other systems reviewed and are negative.  Physical Exam Updated Vital Signs BP (!) 181/107 (BP Location: Right Arm)   Pulse (!) 55   Temp 98.2 F (36.8 C) (Oral)   Resp (!) 24   Ht 1.778 m ('5\' 10"'$ )   Wt (!) 160.6 kg   SpO2 96%   BMI 50.79 kg/m   Physical Exam Vitals and nursing note reviewed.  Constitutional:      General: He is not in acute distress.    Appearance: He is well-developed.     Comments: Elevated BMI  HENT:     Head: Normocephalic and atraumatic.     Right Ear: External ear normal.     Left Ear: External ear normal.  Eyes:     General: No scleral icterus.       Right eye: No discharge.        Left eye: No discharge.     Conjunctiva/sclera: Conjunctivae normal.  Neck:     Trachea: No tracheal deviation.  Cardiovascular:     Rate and Rhythm: Normal rate and regular rhythm.  Pulmonary:     Effort: Pulmonary effort is normal. No respiratory distress.     Breath sounds: Normal breath sounds. No stridor. No wheezing or rales.  Abdominal:     General: Bowel sounds are normal. There is no distension.     Palpations: Abdomen is soft.     Tenderness: There is no abdominal tenderness. There is no guarding or rebound.  Musculoskeletal:        General: Swelling present. No tenderness or deformity.     Cervical back: Neck supple.     Comments: Chronic venous stasis changes bilateral lower extremities  primarily below the knee, deformity left foot, bilateral edema, diffuse serous drainage noted from bilateral lower legs left greater than right, several areas of superficial skin breakdown, odor noted to the wounds but no deep ulcers, no lymphangitis, no maggots noted  Skin:    General: Skin is warm and dry.  Neurological:     General: No focal deficit present.     Mental Status: He is alert.     Cranial Nerves:  No cranial nerve deficit (no facial droop, extraocular movements intact, no slurred speech).     Sensory: No sensory deficit.     Motor: No abnormal muscle tone or seizure activity.     Coordination: Coordination normal.  Psychiatric:        Mood and Affect: Mood normal.       ED Results / Procedures / Treatments   Labs (all labs ordered are listed, but only abnormal results are displayed) Labs Reviewed  COMPREHENSIVE METABOLIC PANEL - Abnormal; Notable for the following components:      Result Value   Glucose, Bld 164 (*)    BUN 27 (*)    Creatinine, Ser 1.31 (*)    All other components within normal limits  CBC WITH DIFFERENTIAL/PLATELET - Abnormal; Notable for the following components:   MCH 24.3 (*)    RDW 18.4 (*)    All other components within normal limits    EKG None  Radiology No results found.  Procedures Procedures   Medications Ordered in ED Medications  hydrALAZINE (APRESOLINE) tablet 50 mg (has no administration in time range)    ED Course  I have reviewed the triage vital signs and the nursing notes.  Pertinent labs & imaging results that were available during my care of the patient were reviewed by me and considered in my medical decision making (see chart for details).    MDM Rules/Calculators/A&P                           Patient has evidence of chronic venous stasis and skin breakdown bilateral lower extremities.  No evidence of cellulitis on exam.  No deep ulcerative wounds.  No signs of maggot infestation.  Patient CBC is normal.   Metabolic panel is unremarkable.  Doubt acute infection or osteomyelitis.  We perform hygiene and wound care, redressed patient's wound  Patient also noted to be hypertensive.  We will give him an oral dose of his hydralazine.  Outpatient follow-up with PCP. Final Clinical Impression(s) / ED Diagnoses Final diagnoses:  Wound of left leg, initial encounter  Wound of right leg, initial encounter  Peripheral edema    Rx / DC Orders ED Discharge Orders     None        Dorie Rank, MD 04/09/21 Jeri Lager

## 2021-04-10 DIAGNOSIS — I110 Hypertensive heart disease with heart failure: Secondary | ICD-10-CM | POA: Diagnosis not present

## 2021-04-10 DIAGNOSIS — L97821 Non-pressure chronic ulcer of other part of left lower leg limited to breakdown of skin: Secondary | ICD-10-CM | POA: Diagnosis not present

## 2021-04-10 DIAGNOSIS — I87333 Chronic venous hypertension (idiopathic) with ulcer and inflammation of bilateral lower extremity: Secondary | ICD-10-CM | POA: Diagnosis not present

## 2021-04-10 DIAGNOSIS — G4733 Obstructive sleep apnea (adult) (pediatric): Secondary | ICD-10-CM | POA: Diagnosis not present

## 2021-04-10 DIAGNOSIS — I5032 Chronic diastolic (congestive) heart failure: Secondary | ICD-10-CM | POA: Diagnosis not present

## 2021-04-10 DIAGNOSIS — L97811 Non-pressure chronic ulcer of other part of right lower leg limited to breakdown of skin: Secondary | ICD-10-CM | POA: Diagnosis not present

## 2021-04-12 ENCOUNTER — Encounter (HOSPITAL_BASED_OUTPATIENT_CLINIC_OR_DEPARTMENT_OTHER): Payer: Medicare Other | Admitting: Internal Medicine

## 2021-04-12 DIAGNOSIS — Z791 Long term (current) use of non-steroidal anti-inflammatories (NSAID): Secondary | ICD-10-CM | POA: Diagnosis not present

## 2021-04-12 DIAGNOSIS — G4733 Obstructive sleep apnea (adult) (pediatric): Secondary | ICD-10-CM | POA: Diagnosis not present

## 2021-04-12 DIAGNOSIS — I110 Hypertensive heart disease with heart failure: Secondary | ICD-10-CM | POA: Diagnosis not present

## 2021-04-12 DIAGNOSIS — Z9981 Dependence on supplemental oxygen: Secondary | ICD-10-CM | POA: Diagnosis not present

## 2021-04-12 DIAGNOSIS — Z7982 Long term (current) use of aspirin: Secondary | ICD-10-CM | POA: Diagnosis not present

## 2021-04-12 DIAGNOSIS — I87333 Chronic venous hypertension (idiopathic) with ulcer and inflammation of bilateral lower extremity: Secondary | ICD-10-CM | POA: Diagnosis not present

## 2021-04-12 DIAGNOSIS — L97821 Non-pressure chronic ulcer of other part of left lower leg limited to breakdown of skin: Secondary | ICD-10-CM | POA: Diagnosis not present

## 2021-04-12 DIAGNOSIS — Z7902 Long term (current) use of antithrombotics/antiplatelets: Secondary | ICD-10-CM | POA: Diagnosis not present

## 2021-04-12 DIAGNOSIS — I5032 Chronic diastolic (congestive) heart failure: Secondary | ICD-10-CM | POA: Diagnosis not present

## 2021-04-12 DIAGNOSIS — L97811 Non-pressure chronic ulcer of other part of right lower leg limited to breakdown of skin: Secondary | ICD-10-CM | POA: Diagnosis not present

## 2021-04-12 DIAGNOSIS — Z8673 Personal history of transient ischemic attack (TIA), and cerebral infarction without residual deficits: Secondary | ICD-10-CM | POA: Diagnosis not present

## 2021-04-12 DIAGNOSIS — E669 Obesity, unspecified: Secondary | ICD-10-CM | POA: Diagnosis not present

## 2021-04-13 DIAGNOSIS — K429 Umbilical hernia without obstruction or gangrene: Secondary | ICD-10-CM | POA: Diagnosis not present

## 2021-04-13 DIAGNOSIS — K4090 Unilateral inguinal hernia, without obstruction or gangrene, not specified as recurrent: Secondary | ICD-10-CM | POA: Diagnosis not present

## 2021-04-14 DIAGNOSIS — G4733 Obstructive sleep apnea (adult) (pediatric): Secondary | ICD-10-CM | POA: Diagnosis not present

## 2021-04-14 DIAGNOSIS — L97821 Non-pressure chronic ulcer of other part of left lower leg limited to breakdown of skin: Secondary | ICD-10-CM | POA: Diagnosis not present

## 2021-04-14 DIAGNOSIS — I87333 Chronic venous hypertension (idiopathic) with ulcer and inflammation of bilateral lower extremity: Secondary | ICD-10-CM | POA: Diagnosis not present

## 2021-04-14 DIAGNOSIS — I5032 Chronic diastolic (congestive) heart failure: Secondary | ICD-10-CM | POA: Diagnosis not present

## 2021-04-14 DIAGNOSIS — I110 Hypertensive heart disease with heart failure: Secondary | ICD-10-CM | POA: Diagnosis not present

## 2021-04-14 DIAGNOSIS — L97811 Non-pressure chronic ulcer of other part of right lower leg limited to breakdown of skin: Secondary | ICD-10-CM | POA: Diagnosis not present

## 2021-04-16 DIAGNOSIS — L97821 Non-pressure chronic ulcer of other part of left lower leg limited to breakdown of skin: Secondary | ICD-10-CM | POA: Diagnosis not present

## 2021-04-16 DIAGNOSIS — I87333 Chronic venous hypertension (idiopathic) with ulcer and inflammation of bilateral lower extremity: Secondary | ICD-10-CM | POA: Diagnosis not present

## 2021-04-16 DIAGNOSIS — I5032 Chronic diastolic (congestive) heart failure: Secondary | ICD-10-CM | POA: Diagnosis not present

## 2021-04-16 DIAGNOSIS — I110 Hypertensive heart disease with heart failure: Secondary | ICD-10-CM | POA: Diagnosis not present

## 2021-04-16 DIAGNOSIS — L97811 Non-pressure chronic ulcer of other part of right lower leg limited to breakdown of skin: Secondary | ICD-10-CM | POA: Diagnosis not present

## 2021-04-16 DIAGNOSIS — G4733 Obstructive sleep apnea (adult) (pediatric): Secondary | ICD-10-CM | POA: Diagnosis not present

## 2021-04-17 DIAGNOSIS — N200 Calculus of kidney: Secondary | ICD-10-CM | POA: Diagnosis not present

## 2021-04-17 DIAGNOSIS — I110 Hypertensive heart disease with heart failure: Secondary | ICD-10-CM | POA: Diagnosis not present

## 2021-04-17 DIAGNOSIS — I5033 Acute on chronic diastolic (congestive) heart failure: Secondary | ICD-10-CM | POA: Diagnosis not present

## 2021-04-17 DIAGNOSIS — E1121 Type 2 diabetes mellitus with diabetic nephropathy: Secondary | ICD-10-CM | POA: Diagnosis not present

## 2021-04-19 DIAGNOSIS — I110 Hypertensive heart disease with heart failure: Secondary | ICD-10-CM | POA: Diagnosis not present

## 2021-04-19 DIAGNOSIS — L97821 Non-pressure chronic ulcer of other part of left lower leg limited to breakdown of skin: Secondary | ICD-10-CM | POA: Diagnosis not present

## 2021-04-19 DIAGNOSIS — G4733 Obstructive sleep apnea (adult) (pediatric): Secondary | ICD-10-CM | POA: Diagnosis not present

## 2021-04-19 DIAGNOSIS — L97811 Non-pressure chronic ulcer of other part of right lower leg limited to breakdown of skin: Secondary | ICD-10-CM | POA: Diagnosis not present

## 2021-04-19 DIAGNOSIS — I5032 Chronic diastolic (congestive) heart failure: Secondary | ICD-10-CM | POA: Diagnosis not present

## 2021-04-19 DIAGNOSIS — I87333 Chronic venous hypertension (idiopathic) with ulcer and inflammation of bilateral lower extremity: Secondary | ICD-10-CM | POA: Diagnosis not present

## 2021-04-22 DIAGNOSIS — L97821 Non-pressure chronic ulcer of other part of left lower leg limited to breakdown of skin: Secondary | ICD-10-CM | POA: Diagnosis not present

## 2021-04-22 DIAGNOSIS — I87333 Chronic venous hypertension (idiopathic) with ulcer and inflammation of bilateral lower extremity: Secondary | ICD-10-CM | POA: Diagnosis not present

## 2021-04-22 DIAGNOSIS — I110 Hypertensive heart disease with heart failure: Secondary | ICD-10-CM | POA: Diagnosis not present

## 2021-04-22 DIAGNOSIS — L97811 Non-pressure chronic ulcer of other part of right lower leg limited to breakdown of skin: Secondary | ICD-10-CM | POA: Diagnosis not present

## 2021-04-22 DIAGNOSIS — I5032 Chronic diastolic (congestive) heart failure: Secondary | ICD-10-CM | POA: Diagnosis not present

## 2021-04-22 DIAGNOSIS — G4733 Obstructive sleep apnea (adult) (pediatric): Secondary | ICD-10-CM | POA: Diagnosis not present

## 2021-04-26 DIAGNOSIS — G4733 Obstructive sleep apnea (adult) (pediatric): Secondary | ICD-10-CM | POA: Diagnosis not present

## 2021-04-26 DIAGNOSIS — I87333 Chronic venous hypertension (idiopathic) with ulcer and inflammation of bilateral lower extremity: Secondary | ICD-10-CM | POA: Diagnosis not present

## 2021-04-26 DIAGNOSIS — L97811 Non-pressure chronic ulcer of other part of right lower leg limited to breakdown of skin: Secondary | ICD-10-CM | POA: Diagnosis not present

## 2021-04-26 DIAGNOSIS — I110 Hypertensive heart disease with heart failure: Secondary | ICD-10-CM | POA: Diagnosis not present

## 2021-04-26 DIAGNOSIS — L97821 Non-pressure chronic ulcer of other part of left lower leg limited to breakdown of skin: Secondary | ICD-10-CM | POA: Diagnosis not present

## 2021-04-26 DIAGNOSIS — I5032 Chronic diastolic (congestive) heart failure: Secondary | ICD-10-CM | POA: Diagnosis not present

## 2021-04-30 DIAGNOSIS — I87333 Chronic venous hypertension (idiopathic) with ulcer and inflammation of bilateral lower extremity: Secondary | ICD-10-CM | POA: Diagnosis not present

## 2021-04-30 DIAGNOSIS — G4733 Obstructive sleep apnea (adult) (pediatric): Secondary | ICD-10-CM | POA: Diagnosis not present

## 2021-04-30 DIAGNOSIS — I5032 Chronic diastolic (congestive) heart failure: Secondary | ICD-10-CM | POA: Diagnosis not present

## 2021-04-30 DIAGNOSIS — L97811 Non-pressure chronic ulcer of other part of right lower leg limited to breakdown of skin: Secondary | ICD-10-CM | POA: Diagnosis not present

## 2021-04-30 DIAGNOSIS — I110 Hypertensive heart disease with heart failure: Secondary | ICD-10-CM | POA: Diagnosis not present

## 2021-04-30 DIAGNOSIS — L97821 Non-pressure chronic ulcer of other part of left lower leg limited to breakdown of skin: Secondary | ICD-10-CM | POA: Diagnosis not present

## 2021-05-03 DIAGNOSIS — I5032 Chronic diastolic (congestive) heart failure: Secondary | ICD-10-CM | POA: Diagnosis not present

## 2021-05-03 DIAGNOSIS — G4733 Obstructive sleep apnea (adult) (pediatric): Secondary | ICD-10-CM | POA: Diagnosis not present

## 2021-05-03 DIAGNOSIS — I87333 Chronic venous hypertension (idiopathic) with ulcer and inflammation of bilateral lower extremity: Secondary | ICD-10-CM | POA: Diagnosis not present

## 2021-05-03 DIAGNOSIS — L97811 Non-pressure chronic ulcer of other part of right lower leg limited to breakdown of skin: Secondary | ICD-10-CM | POA: Diagnosis not present

## 2021-05-03 DIAGNOSIS — I110 Hypertensive heart disease with heart failure: Secondary | ICD-10-CM | POA: Diagnosis not present

## 2021-05-03 DIAGNOSIS — L97821 Non-pressure chronic ulcer of other part of left lower leg limited to breakdown of skin: Secondary | ICD-10-CM | POA: Diagnosis not present

## 2021-05-07 DIAGNOSIS — I110 Hypertensive heart disease with heart failure: Secondary | ICD-10-CM | POA: Diagnosis not present

## 2021-05-07 DIAGNOSIS — G4733 Obstructive sleep apnea (adult) (pediatric): Secondary | ICD-10-CM | POA: Diagnosis not present

## 2021-05-07 DIAGNOSIS — L97821 Non-pressure chronic ulcer of other part of left lower leg limited to breakdown of skin: Secondary | ICD-10-CM | POA: Diagnosis not present

## 2021-05-07 DIAGNOSIS — L97811 Non-pressure chronic ulcer of other part of right lower leg limited to breakdown of skin: Secondary | ICD-10-CM | POA: Diagnosis not present

## 2021-05-07 DIAGNOSIS — I5032 Chronic diastolic (congestive) heart failure: Secondary | ICD-10-CM | POA: Diagnosis not present

## 2021-05-07 DIAGNOSIS — I87333 Chronic venous hypertension (idiopathic) with ulcer and inflammation of bilateral lower extremity: Secondary | ICD-10-CM | POA: Diagnosis not present

## 2021-05-10 ENCOUNTER — Other Ambulatory Visit: Payer: Self-pay

## 2021-05-10 ENCOUNTER — Encounter (HOSPITAL_BASED_OUTPATIENT_CLINIC_OR_DEPARTMENT_OTHER): Payer: Medicare Other | Attending: Internal Medicine | Admitting: Internal Medicine

## 2021-05-10 DIAGNOSIS — I110 Hypertensive heart disease with heart failure: Secondary | ICD-10-CM | POA: Diagnosis not present

## 2021-05-10 DIAGNOSIS — L97822 Non-pressure chronic ulcer of other part of left lower leg with fat layer exposed: Secondary | ICD-10-CM | POA: Diagnosis not present

## 2021-05-10 DIAGNOSIS — G4733 Obstructive sleep apnea (adult) (pediatric): Secondary | ICD-10-CM | POA: Diagnosis not present

## 2021-05-10 DIAGNOSIS — L97812 Non-pressure chronic ulcer of other part of right lower leg with fat layer exposed: Secondary | ICD-10-CM | POA: Diagnosis not present

## 2021-05-10 DIAGNOSIS — I509 Heart failure, unspecified: Secondary | ICD-10-CM | POA: Insufficient documentation

## 2021-05-10 DIAGNOSIS — I890 Lymphedema, not elsewhere classified: Secondary | ICD-10-CM | POA: Diagnosis not present

## 2021-05-10 DIAGNOSIS — I5032 Chronic diastolic (congestive) heart failure: Secondary | ICD-10-CM | POA: Diagnosis not present

## 2021-05-10 DIAGNOSIS — Z6841 Body Mass Index (BMI) 40.0 and over, adult: Secondary | ICD-10-CM | POA: Diagnosis not present

## 2021-05-10 DIAGNOSIS — I87333 Chronic venous hypertension (idiopathic) with ulcer and inflammation of bilateral lower extremity: Secondary | ICD-10-CM | POA: Diagnosis not present

## 2021-05-10 DIAGNOSIS — L97811 Non-pressure chronic ulcer of other part of right lower leg limited to breakdown of skin: Secondary | ICD-10-CM | POA: Diagnosis not present

## 2021-05-10 DIAGNOSIS — L97821 Non-pressure chronic ulcer of other part of left lower leg limited to breakdown of skin: Secondary | ICD-10-CM | POA: Insufficient documentation

## 2021-05-10 DIAGNOSIS — L97212 Non-pressure chronic ulcer of right calf with fat layer exposed: Secondary | ICD-10-CM | POA: Diagnosis not present

## 2021-05-11 NOTE — Progress Notes (Signed)
[name] [name] ([id number]) , Visit Report for 05/10/2021 Arrival Information Details Patient Name: Date of Service: [name] Ro RDIN W. 05/10/2021 12:30 PM Medical Record Number: [id number] Patient Account Number: [id number] Date of Birth/Sex: Treating RN: [birth year] (65 y.o. [name] Primary Care Adaiah Morken: [name] Other Clinician: Referring Dimetri Armitage: Treating Jsaon Yoo/Extender: [name] in Treatment: 10 Visit Information History Since Last Visit Added or deleted any medications: No Patient Arrived: [name]: No Arrival Time: 12:47 Had a fall or experienced change in No Transfer Assistance: None activities of daily living that may affect Patient Identification Verified: Yes risk of falls: Secondary Verification Process Completed: Yes Signs or symptoms of abuse/neglect since No Patient Requires Transmission-Based Precautions: No last visito Patient Has Alerts: Yes Hospitalized since last visit: No Patient Alerts: R ABI = 1.25 Implantable device outside of the clinic No L ABI = 1.27 excluding cellular tissue based products placed in the center since last visit: Has Dressing in Place as Prescribed: Yes Has Compression in Place as Prescribed: Yes Has Footwear/Offloading in Place as Yes Prescribed: Left: Surgical Shoe with Pressure Relief Insole Right: Surgical Shoe with Pressure Relief Insole Pain Present Now: No Electronic Signature(s) Signed: 05/10/2021 5:51:35 PM By: [name] Entered By: [name] on 05/10/2021 12:50:17 -------------------------------------------------------------------------------- Compression Therapy Details Patient Name: Date of Service: [name] Ro RDIN W. 05/10/2021 12:30 PM Medical Record Number: [id number] Patient Account Number: [id number] Date of Birth/Sex: Treating RN: Jul 01, 1956 (66 y.o. [name], Lauren Primary Care Latavion Halls: [name] Other  Clinician: Referring Avaneesh Pepitone: Treating Taletha Twiford/Extender: [name] in Treatment: 76 Compression Therapy Performed for Wound Assessment: Wound #65 Left,Distal,Anterior Lower Leg Performed By: Clinician Rhae Hammock, RN Compression Type: Four Layer Post Procedure Diagnosis Same as Pre-procedure Electronic Signature(s) Signed: 05/10/2021 5:31:05 PM By: Rhae Hammock RN Entered By: Rhae Hammock on 05/10/2021 13:40:40 -------------------------------------------------------------------------------- Compression Therapy Details Patient Name: Date of Service: [name] Ro RDIN W. 05/10/2021 12:30 PM Medical Record Number: [id number] Patient Account Number: [id number] Date of Birth/Sex: Treating RN: [birth year] (65 y.o. [name], Lauren Primary Care Collyn Selk: [name] Other Clinician: Referring Kyrstin Campillo: Treating Braelynn Benning/Extender: [name] in Treatment: 76 Compression Therapy Performed for Wound Assessment: Wound #68 Left,Proximal,Anterior Lower Leg Performed By: Clinician Rhae Hammock, RN Compression Type: Four Layer Post Procedure Diagnosis Same as Pre-procedure Electronic Signature(s) Signed: 05/10/2021 5:31:05 PM By: Rhae Hammock RN Entered By: Rhae Hammock on 05/10/2021 13:40:40 -------------------------------------------------------------------------------- Compression Therapy Details Patient Name: Date of Service: [name] Ro RDIN W. 05/10/2021 12:30 PM Medical Record Number: [id number] Patient Account Number: [id number] Date of Birth/Sex: Treating RN: 06-07-1956 (65 y.o. [name], Lauren Primary Care Taevin Mcferran: [name] Other Clinician: Referring Maeby Vankleeck: Treating Davionne Mastrangelo/Extender: [name] in Treatment: 76 Compression Therapy Performed for Wound Assessment: Wound #73 Right,Lateral Lower Leg Performed By: Clinician Rhae Hammock, RN Compression  Type: Four Layer Post Procedure Diagnosis Same as Pre-procedure Electronic Signature(s) Signed: 05/10/2021 5:31:05 PM By: Rhae Hammock RN Entered By: Rhae Hammock on 05/10/2021 13:40:40 -------------------------------------------------------------------------------- Compression Therapy Details Patient Name: Date of Service: [name] Ro RDIN W. 05/10/2021 12:30 PM Medical Record Number: [id number] Patient Account Number: [id number] Date of Birth/Sex: Treating RN: 06-Aug-1956 (65 y.o. [name] Primary Care Brittini Brubeck: [name] Other Clinician: Referring Jabree Pernice: Treating Chet Greenley/Extender: [name] in Treatment: 76 Compression Therapy Performed for Wound Assessment: Wound #77 Left,Proximal,Lateral Lower Leg Performed By: Clinician Rhae Hammock, RN Compression Type: Four Layer  Post Procedure Diagnosis Same as Pre-procedure Electronic Signature(s) Signed: 05/10/2021 5:31:05 PM By: Rhae Hammock RN Entered By: Rhae Hammock on 05/10/2021 13:40:40 -------------------------------------------------------------------------------- Compression Therapy Details Patient Name: Date of Service: [name] Ro RDIN W. 05/10/2021 12:30 PM Medical Record Number: [id number] Patient Account Number: [id number] Date of Birth/Sex: Treating RN: Feb 01, 1956 (65 y.o. [name], Lauren Primary Care Starlina Lapre: [name] Other Clinician: Referring Maham Quintin: Treating Cabell Lazenby/Extender: [name] in Treatment: 76 Compression Therapy Performed for Wound Assessment: Wound #78 Right,Posterior Lower Leg Performed By: Clinician Rhae Hammock, RN Compression Type: Four Layer Post Procedure Diagnosis Same as Pre-procedure Electronic Signature(s) Signed: 05/10/2021 5:31:05 PM By: Rhae Hammock RN Entered By: Rhae Hammock on 05/10/2021  13:40:40 -------------------------------------------------------------------------------- Encounter Discharge Information Details Patient Name: Date of Service: [name] [name], [name] RDIN W. 05/10/2021 12:30 PM Medical Record Number: [id number] Patient Account Number: [id number] Date of Birth/Sex: Treating RN: 1955-12-16 (65 y.o. [name] Primary Care Zymeir Salminen: [name] Other Clinician: Referring Jdyn Parkerson: Treating Onell Mcmath/Extender: [name] in Treatment: 69 Encounter Discharge Information Items Discharge Condition: Stable Ambulatory Status: Cane Discharge Destination: Home Transportation: Private Auto Schedule Follow-up Appointment: Yes Clinical Summary of Care: Provided on 05/10/2021 Form Type Recipient Paper Patient Patient Electronic Signature(s) Signed: 05/10/2021 5:19:14 PM By: [name] Entered By: [name] on 05/10/2021 17:19:14 -------------------------------------------------------------------------------- Lower Extremity Assessment Details Patient Name: Date of Service: [name] Ro RDIN W. 05/10/2021 12:30 PM Medical Record Number: [id number] Patient Account Number: [id number] Date of Birth/Sex: Treating RN: 01/31/1956 (65 y.o. [name] Primary Care Barret Esquivel: [name] Other Clinician: Referring Kinsley Holderman: Treating Cyanne Delmar/Extender: [name] in Treatment: 76 Edema Assessment Assessed: [name]: Yes] [name]: Yes] Edema: [Left: Yes] [Right: Yes] Calf Left: Right: Point of Measurement: 30 cm From Medial Instep 41.5 cm 41 cm Ankle Left: Right: Point of Measurement: 13 cm From Medial Instep 24.6 cm 27 cm Electronic Signature(s) Signed: 05/10/2021 5:51:35 PM By: [name] Entered By: [name] on 05/10/2021 13:01:20 -------------------------------------------------------------------------------- Multi Wound Chart Details Patient Name: Date of Service: [name] [name], [name] RDIN W.  05/10/2021 12:30 PM Medical Record Number: [id number] Patient Account Number: [id number] Date of Birth/Sex: Treating RN: [birth year] (65 y.o. [name], Lauren Primary Care Beyonka Pitney: [name] Other Clinician: Referring Debborah Alonge: Treating Coleman Kalas/Extender: [name] in Treatment: 84 Vital Signs Height(in): 70 Pulse(bpm): 110 Weight(lbs): 360 Blood Pressure(mmHg): 151/82 Body Mass Index(BMI): 52 Temperature(F): 97.9 Respiratory Rate(breaths/min): 22 Photos: Left, Distal, Anterior Lower Leg Left, Proximal, Anterior Lower Leg Right, Lateral Lower Leg Wound Location: Gradually Appeared Gradually Appeared Gradually Appeared Wounding Event: Venous Leg Ulcer Lymphedema Lymphedema Primary Etiology: Lymphedema N/A N/A Secondary Etiology: Lymphedema, Sleep Apnea, Lymphedema, Sleep Apnea, Lymphedema, Sleep Apnea, Comorbid History: Congestive Heart Failure, Congestive Heart Failure, Congestive Heart Failure, Hypertension, Peripheral Venous Hypertension, Peripheral Venous Hypertension, Peripheral Venous Disease, Osteoarthritis Disease, Osteoarthritis Disease, Osteoarthritis 09/08/2019 03/23/2020 08/03/2020 Date Acquired: 76 59 38 Weeks of Treatment: Open Open Open Wound Status: Yes Yes Yes Clustered Wound: 1 1 4  Clustered Quantity: 0.4x0.6x0.2 8x12x0.1 1.2x0.5x0.1 Measurements L x W x D (cm) 0.188 75.398 0.471 A (cm) : rea 0.038 7.54 0.047 Volume (cm) : 100.00% -4056.40% 99.40% % Reduction in Area: 99.90% -4065.70% 99.40% % Reduction in Volume: Full Thickness Without Exposed Full Thickness Without Exposed Full Thickness Without Exposed Classification: Support Structures Support Structures Support Structures Medium Small Medium Exudate Amount: Purulent Serosanguineous Serosanguineous Exudate Type: yellow, brown, green red, brown red, brown Exudate Color: Distinct, outline attached Distinct, outline attached Distinct, outline  attached Wound  Margin: Large (67-100%) Large (67-100%) Large (67-100%) Granulation Amount: Pink Red Red, Pink Granulation Quality: Small (1-33%) None Present (0%) None Present (0%) Necrotic Amount: Fat Layer (Subcutaneous Tissue): Yes Fat Layer (Subcutaneous Tissue): Yes Fat Layer (Subcutaneous Tissue): Yes Exposed Structures: Fascia: No Fascia: No Fascia: No Tendon: No Tendon: No Tendon: No Muscle: No Muscle: No Muscle: No Joint: No Joint: No Joint: No Bone: No Bone: No Bone: No Medium (34-66%) Large (67-100%) Small (1-33%) Epithelialization: Compression Therapy Compression Therapy Compression Therapy Procedures Performed: Wound Number: [id number] 78 N/A Photos: N/A Left, Proximal, Lateral Lower Leg Right, Posterior Lower Leg N/A Wound Location: Gradually Appeared Gradually Appeared N/A Wounding Event: Lymphedema Lymphedema N/A Primary Etiology: N/A N/A N/A Secondary Etiology: Lymphedema, Sleep Apnea, Lymphedema, Sleep Apnea, N/A Comorbid History: Congestive Heart Failure, Congestive Heart Failure, Hypertension, Peripheral Venous Hypertension, Peripheral Venous Disease, Osteoarthritis Disease, Osteoarthritis 11/16/2020 11/16/2020 N/A Date Acquired: 25 25 N/A Weeks of Treatment: Open Open N/A Wound Status: No No N/A Clustered Wound: N/A N/A N/A Clustered Quantity: 2.8x2.3x0.1 1x0.6x0.1 N/A Measurements L x W x D (cm) 5.058 0.471 N/A A (cm) : rea 0.506 0.047 N/A Volume (cm) : -41.50% 95.60% N/A % Reduction in Area: -41.70% 95.60% N/A % Reduction in Volume: Full Thickness Without Exposed Full Thickness Without Exposed N/A Classification: Support Structures Support Structures Medium Medium N/A Exudate Amount: Serosanguineous Serosanguineous N/A Exudate Type: red, brown red, brown N/A Exudate Color: Distinct, outline attached Distinct, outline attached N/A Wound Margin: Large (67-100%) Large (67-100%) N/A Granulation Amount: Red Red N/A Granulation Quality: Small  (1-33%) None Present (0%) N/A Necrotic Amount: Fat Layer (Subcutaneous Tissue): Yes Fat Layer (Subcutaneous Tissue): Yes N/A Exposed Structures: Fascia: No Fascia: No Tendon: No Tendon: No Muscle: No Muscle: No Joint: No Joint: No Bone: No Bone: No Small (1-33%) Large (67-100%) N/A Epithelialization: Compression Therapy Compression Therapy N/A Procedures Performed: Treatment Notes Electronic Signature(s) Signed: 05/10/2021 5:31:05 PM By: Rhae Hammock RN Signed: 05/11/2021 8:02:04 AM By: Linton Ham MD Entered By: Linton Ham on 05/10/2021 14:07:23 -------------------------------------------------------------------------------- Multi-Disciplinary Care Plan Details Patient Name: Date of Service: [name] [name], [name] RDIN W. 05/10/2021 12:30 PM Medical Record Number: [id number] Patient Account Number: [id number] Date of Birth/Sex: Treating RN: 1955-12-06 (65 y.o. [name], Lauren Primary Care Ronalee Scheunemann: [name] Other Clinician: Referring Dan Scearce: Treating Nickalos Petersen/Extender: [name] in Treatment: 80 Active Inactive Venous Leg Ulcer Nursing Diagnoses: Knowledge deficit related to disease process and management Potential for venous Insuffiency (use before diagnosis confirmed) Goals: Patient will maintain optimal edema control Date Initiated: 01/11/2021 Target Resolution Date: 06/08/2021 Goal Status: Active Interventions: Assess peripheral edema status every visit. Compression as ordered Treatment Activities: Therapeutic compression applied : 01/11/2021 Notes: Wound/Skin Impairment Nursing Diagnoses: Knowledge deficit related to ulceration/compromised skin integrity Goals: Patient/caregiver will verbalize understanding of skin care regimen Date Initiated: 11/25/2019 Target Resolution Date: 06/08/2021 Goal Status: Active Ulcer/skin breakdown will have a volume reduction of 30% by week 4 Date Initiated: 11/25/2019 Date Inactivated:  01/06/2020 Target Resolution Date: 12/26/2019 Goal Status: Met Ulcer/skin breakdown will have a volume reduction of 50% by week 8 Date Initiated: 01/06/2020 Date Inactivated: 02/17/2020 Target Resolution Date: 02/05/2020 Goal Status: Met Interventions: Assess patient/caregiver ability to obtain necessary supplies Assess patient/caregiver ability to perform ulcer/skin care regimen upon admission and as needed Assess ulceration(s) every visit Notes: Electronic Signature(s) Signed: 05/10/2021 5:31:05 PM By: Rhae Hammock RN Entered By: Rhae Hammock on 05/10/2021 13:47:08 -------------------------------------------------------------------------------- Pain Assessment Details Patient Name: Date of Service: [name] [name], [name] RDIN W.  05/10/2021 12:30 PM Medical Record Number: [id number] Patient Account Number: [id number] Date of Birth/Sex: Treating RN: May 03, 1956 (65 y.o. [name] Primary Care Tushar Enns: [name] Other Clinician: Referring Alekxander Isola: Treating Everlynn Sagun/Extender: [name] in Treatment: 59 Active Problems Location of Pain Severity and Description of Pain Patient Has Paino No Site Locations Pain Management and Medication Current Pain Management: Electronic Signature(s) Signed: 05/10/2021 5:51:35 PM By: [name] Entered By: [name] on 05/10/2021 12:50:27 -------------------------------------------------------------------------------- Patient/Caregiver Education Details Patient Name: Date of Service: [name] 8/23/2022andnbsp12:30 PM Medical Record Number: [id number] Patient Account Number: [id number] Date of Birth/Gender: Treating RN: Apr 20, 1956 (65 y.o. [name] Primary Care Physician: [name] Other Clinician: Referring Physician: Treating Physician/Extender: [name] in Treatment: 88 Education Assessment Education Provided To: Patient Education Topics  Provided Basic Hygiene: Methods: Explain/Verbal Responses: State content correctly Electronic Signature(s) Signed: 05/10/2021 5:31:05 PM By: Rhae Hammock RN Entered By: Rhae Hammock on 05/10/2021 13:47:21 -------------------------------------------------------------------------------- Wound Assessment Details Patient Name: Date of Service: [name] Ro RDIN W. 05/10/2021 12:30 PM Medical Record Number: [id number] Patient Account Number: [id number] Date of Birth/Sex: Treating RN: September 15, 1956 (65 y.o. [name] Primary Care Evett Kassa: [name] Other Clinician: Referring Christinea Brizuela: Treating Murray Guzzetta/Extender: [name] in Treatment: 76 Wound Status Wound Number: 65 Primary Venous Leg Ulcer Etiology: Wound Location: Left, Distal, Anterior Lower Leg Secondary Lymphedema Wounding Event: Gradually Appeared Etiology: Date Acquired: 09/08/2019 Wound Open Weeks Of Treatment: 76 Status: Clustered Wound: Yes Comorbid Lymphedema, Sleep Apnea, Congestive Heart Failure, History: Hypertension, Peripheral Venous Disease, Osteoarthritis Photos Wound Measurements Length: (cm) 0. Width: (cm) 0. Depth: (cm) 0. Clustered Quantity: 1 Area: (cm) 0 Volume: (cm) 0 4 % Reduction in Area: 100% 6 % Reduction in Volume: 99.9% 2 Epithelialization: Medium (34-66%) Tunneling: No .188 Undermining: No .038 Wound Description Classification: Full Thickness Without Exposed Support Structures Wound Margin: Distinct, outline attached Exudate Amount: Medium Exudate Type: Purulent Exudate Color: yellow, brown, green Foul Odor After Cleansing: No Slough/Fibrino Yes Wound Bed Granulation Amount: Large (67-100%) Exposed Structure Granulation Quality: Pink Fascia Exposed: No Necrotic Amount: Small (1-33%) Fat Layer (Subcutaneous Tissue) Exposed: Yes Necrotic Quality: Adherent Slough Tendon Exposed: No Muscle Exposed: No Joint Exposed: No Bone Exposed:  No Treatment Notes Wound #65 (Lower Leg) Wound Laterality: Left, Anterior, Distal Cleanser Soap and Water Discharge Instruction: May shower and wash wound with dial antibacterial soap and water prior to dressing change. Normal Saline Discharge Instruction: Cleanse the wound with Normal Saline prior to applying a clean dressing using gauze sponges, not tissue or cotton balls. Peri-Wound Care Triamcinolone 15 (g) Discharge Instruction: Use triamcinolone 15 (g) as directed Sween Lotion (Moisturizing lotion) Discharge Instruction: Apply moisturizing lotion as directed Topical Primary Dressing KerraCel Ag Gelling Fiber Dressing, 4x5 in (silver alginate) Discharge Instruction: Apply silver alginate to wound bed as instructed Secondary Dressing Woven Gauze Sponge, Non-Sterile 4x4 in Discharge Instruction: Apply over primary dressing as directed. ABD Pad, 5x9 Discharge Instruction: Apply over primary dressing as directed. Secured With Compression Wrap FourPress (4 layer compression wrap) Discharge Instruction: Apply four layer compression as directed. Compression Stockings Add-Ons Electronic Signature(s) Signed: 05/10/2021 5:51:35 PM By: [name] Entered By: [name] on 05/10/2021 13:14:59 -------------------------------------------------------------------------------- Wound Assessment Details Patient Name: Date of Service: [name] Ro RDIN W. 05/10/2021 12:30 PM Medical Record Number: [id number] Patient Account Number: [id number] Date of Birth/Sex: Treating RN: [birth year] (65 y.o. [name] Primary Care Shadara Lopez: [name] Other Clinician: Referring Usama Harkless:  Treating Sade Hollon/Extender: [name] in Treatment: 76 Wound Status Wound Number: 68 Primary Lymphedema Etiology: Wound Location: Left, Proximal, Anterior Lower Leg Wound Open Wounding Event: Gradually Appeared Status: Date Acquired: 03/23/2020 Comorbid Lymphedema, Sleep  Apnea, Congestive Heart Failure, Weeks Of Treatment: 59 History: Hypertension, Peripheral Venous Disease, Osteoarthritis Clustered Wound: Yes Photos Wound Measurements Length: (cm) 8 Width: (cm) 12 Depth: (cm) 0.1 Clustered Quantity: 1 Area: (cm) 75.398 Volume: (cm) 7.54 % Reduction in Area: -4056.4% % Reduction in Volume: -4065.7% Epithelialization: Large (67-100%) Tunneling: No Undermining: No Wound Description Classification: Full Thickness Without Exposed Support Structures Wound Margin: Distinct, outline attached Exudate Amount: Small Exudate Type: Serosanguineous Exudate Color: red, brown Foul Odor After Cleansing: No Slough/Fibrino No Wound Bed Granulation Amount: Large (67-100%) Exposed Structure Granulation Quality: Red Fascia Exposed: No Necrotic Amount: None Present (0%) Fat Layer (Subcutaneous Tissue) Exposed: Yes Tendon Exposed: No Muscle Exposed: No Joint Exposed: No Bone Exposed: No Treatment Notes Wound #68 (Lower Leg) Wound Laterality: Left, Anterior, Proximal Cleanser Soap and Water Discharge Instruction: May shower and wash wound with dial antibacterial soap and water prior to dressing change. Normal Saline Discharge Instruction: Cleanse the wound with Normal Saline prior to applying a clean dressing using gauze sponges, not tissue or cotton balls. Peri-Wound Care Triamcinolone 15 (g) Discharge Instruction: Use triamcinolone 15 (g) as directed Sween Lotion (Moisturizing lotion) Discharge Instruction: Apply moisturizing lotion as directed Topical Primary Dressing KerraCel Ag Gelling Fiber Dressing, 4x5 in (silver alginate) Discharge Instruction: Apply silver alginate to wound bed as instructed Secondary Dressing Woven Gauze Sponge, Non-Sterile 4x4 in Discharge Instruction: Apply over primary dressing as directed. ABD Pad, 5x9 Discharge Instruction: Apply over primary dressing as directed. Secured With Compression Wrap FourPress (4 layer  compression wrap) Discharge Instruction: Apply four layer compression as directed. Compression Stockings Add-Ons Electronic Signature(s) Signed: 05/10/2021 5:51:35 PM By: [name] Entered By: [name] on 05/10/2021 13:15:56 -------------------------------------------------------------------------------- Wound Assessment Details Patient Name: Date of Service: [name] Ro RDIN W. 05/10/2021 12:30 PM Medical Record Number: [id number] Patient Account Number: [id number] Date of Birth/Sex: Treating RN: [birth year] (65 y.o. [name] Primary Care Jubal Rademaker: [name] Other Clinician: Referring Cyruss Arata: Treating Davian Hanshaw/Extender: [name] in Treatment: 76 Wound Status Wound Number: 73 Primary Lymphedema Etiology: Wound Location: Right, Lateral Lower Leg Wound Open Wounding Event: Gradually Appeared Status: Date Acquired: 08/03/2020 Comorbid Lymphedema, Sleep Apnea, Congestive Heart Failure, Weeks Of Treatment: 38 History: Hypertension, Peripheral Venous Disease, Osteoarthritis Clustered Wound: Yes Photos Wound Measurements Length: (cm) 1.2 Width: (cm) 0.5 Depth: (cm) 0.1 Clustered Quantity: 4 Area: (cm) 0.471 Volume: (cm) 0.047 % Reduction in Area: 99.4% % Reduction in Volume: 99.4% Epithelialization: Small (1-33%) Tunneling: No Undermining: No Wound Description Classification: Full Thickness Without Exposed Support Structures Wound Margin: Distinct, outline attached Exudate Amount: Medium Exudate Type: Serosanguineous Exudate Color: red, brown Foul Odor After Cleansing: No Slough/Fibrino No Wound Bed Granulation Amount: Large (67-100%) Exposed Structure Granulation Quality: Red, Pink Fascia Exposed: No Necrotic Amount: None Present (0%) Fat Layer (Subcutaneous Tissue) Exposed: Yes Tendon Exposed: No Muscle Exposed: No Joint Exposed: No Bone Exposed: No Treatment Notes Wound #73 (Lower Leg) Wound Laterality:  Right, Lateral Cleanser Soap and Water Discharge Instruction: May shower and wash wound with dial antibacterial soap and water prior to dressing change. Normal Saline Discharge Instruction: Cleanse the wound with Normal Saline prior to applying a clean dressing using gauze sponges, not tissue or cotton balls. Peri-Wound Care Triamcinolone 15 (g) Discharge Instruction: Use triamcinolone 15 (  g) as directed Sween Lotion (Moisturizing lotion) Discharge Instruction: Apply moisturizing lotion as directed Topical Primary Dressing KerraCel Ag Gelling Fiber Dressing, 4x5 in (silver alginate) Discharge Instruction: Apply silver alginate to wound bed as instructed Secondary Dressing Woven Gauze Sponge, Non-Sterile 4x4 in Discharge Instruction: Apply over primary dressing as directed. ABD Pad, 5x9 Discharge Instruction: Apply over primary dressing as directed. Secured With Compression Wrap FourPress (4 layer compression wrap) Discharge Instruction: Apply four layer compression as directed. Compression Stockings Add-Ons Electronic Signature(s) Signed: 05/10/2021 5:51:35 PM By: [name] Entered By: [name] on 05/10/2021 13:14:17 -------------------------------------------------------------------------------- Wound Assessment Details Patient Name: Date of Service: [name] Ro RDIN W. 05/10/2021 12:30 PM Medical Record Number: [id number] Patient Account Number: [id number] Date of Birth/Sex: Treating RN: 20-May-1956 (65 y.o. [name] Primary Care [place]e [place]: [name] Other Clinician: Referring Leahann Lempke: Treating Everet Flagg/Extender: [name] in Treatment: 76 Wound Status Wound Number: 77 Primary Lymphedema Etiology: Wound Location: Left, Proximal, Lateral Lower Leg Wound Open Wounding Event: Gradually Appeared Status: Date Acquired: 11/16/2020 Comorbid Lymphedema, Sleep Apnea, Congestive Heart Failure, Weeks Of Treatment: 25 History:  Hypertension, Peripheral Venous Disease, Osteoarthritis Clustered Wound: No Photos Wound Measurements Length: (cm) 2.8 Width: (cm) 2.3 Depth: (cm) 0.1 Area: (cm) 5.058 Volume: (cm) 0.506 % Reduction in Area: -41.5% % Reduction in Volume: -41.7% Epithelialization: Small (1-33%) Tunneling: No Undermining: No Wound Description Classification: Full Thickness Without Exposed Support Structures Wound Margin: Distinct, outline attached Exudate Amount: Medium Exudate Type: Serosanguineous Exudate Color: red, brown Wound Bed Granulation Amount: Large (67-100%) Granulation Quality: Red Necrotic Amount: Small (1-33%) Necrotic Quality: Adherent Slough Foul Odor After Cleansing: No Slough/Fibrino Yes Exposed Structure Fascia Exposed: No Fat Layer (Subcutaneous Tissue) Exposed: Yes Tendon Exposed: No Muscle Exposed: No Joint Exposed: No Bone Exposed: No Treatment Notes Wound #77 (Lower Leg) Wound Laterality: Left, Lateral, Proximal Cleanser Soap and Water Discharge Instruction: May shower and wash wound with dial antibacterial soap and water prior to dressing change. Normal Saline Discharge Instruction: Cleanse the wound with Normal Saline prior to applying a clean dressing using gauze sponges, not tissue or cotton balls. Peri-Wound Care Triamcinolone 15 (g) Discharge Instruction: Use triamcinolone 15 (g) as directed Sween Lotion (Moisturizing lotion) Discharge Instruction: Apply moisturizing lotion as directed Topical Primary Dressing KerraCel Ag Gelling Fiber Dressing, 4x5 in (silver alginate) Discharge Instruction: Apply silver alginate to wound bed as instructed Secondary Dressing Woven Gauze Sponge, Non-Sterile 4x4 in Discharge Instruction: Apply over primary dressing as directed. ABD Pad, 5x9 Discharge Instruction: Apply over primary dressing as directed. Secured With Compression Wrap FourPress (4 layer compression wrap) Discharge Instruction: Apply four layer  compression as directed. Compression Stockings Add-Ons Electronic Signature(s) Signed: 05/10/2021 5:51:35 PM By: [name] Entered By: [name] on 05/10/2021 13:16:53 -------------------------------------------------------------------------------- Wound Assessment Details Patient Name: Date of Service: [name] Ro RDIN W. 05/10/2021 12:30 PM Medical Record Number: [id number] Patient Account Number: [id number] Date of Birth/Sex: Treating RN: [birth year] (65 y.o. [name] Primary Care Aleksei Goodlin: [name] Other Clinician: Referring Zaccai Chavarin: Treating Jewelle Whitner/Extender: [name] in Treatment: 76 Wound Status Wound Number: 78 Primary Lymphedema Etiology: Wound Location: Right, Posterior Lower Leg Wound Open Wounding Event: Gradually Appeared Status: Date Acquired: 11/16/2020 Date Acquired: 11/16/2020 Comorbid Lymphedema, Sleep Apnea, Congestive Heart Failure, Weeks Of Treatment: 25 History: Hypertension, Peripheral Venous Disease, Osteoarthritis Clustered Wound: No Photos Wound Measurements Length: (cm) 1 Width: (cm) 0.6 Depth: (cm) 0.1 Area: (cm) 0.471 Volume: (cm) 0.047 % Reduction in Area: 95.6% % Reduction in  Volume: 95.6% Epithelialization: Large (67-100%) Wound Description Classification: Full Thickness Without Exposed Support Structures Wound Margin: Distinct, outline attached Exudate Amount: Medium Exudate Type: Serosanguineous Exudate Color: red, brown Foul Odor After Cleansing: No Slough/Fibrino No Wound Bed Granulation Amount: Large (67-100%) Exposed Structure Granulation Quality: Red Fascia Exposed: No Necrotic Amount: None Present (0%) Fat Layer (Subcutaneous Tissue) Exposed: Yes Tendon Exposed: No Muscle Exposed: No Joint Exposed: No Bone Exposed: No Treatment Notes Wound #78 (Lower Leg) Wound Laterality: Right, Posterior Cleanser Soap and Water Discharge Instruction: May shower and wash wound with  dial antibacterial soap and water prior to dressing change. Normal Saline Discharge Instruction: Cleanse the wound with Normal Saline prior to applying a clean dressing using gauze sponges, not tissue or cotton balls. Peri-Wound Care Triamcinolone 15 (g) Discharge Instruction: Use triamcinolone 15 (g) as directed Sween Lotion (Moisturizing lotion) Discharge Instruction: Apply moisturizing lotion as directed Topical Primary Dressing KerraCel Ag Gelling Fiber Dressing, 4x5 in (silver alginate) Discharge Instruction: Apply silver alginate to wound bed as instructed Secondary Dressing Woven Gauze Sponge, Non-Sterile 4x4 in Discharge Instruction: Apply over primary dressing as directed. ABD Pad, 5x9 Discharge Instruction: Apply over primary dressing as directed. Secured With Compression Wrap FourPress (4 layer compression wrap) Discharge Instruction: Apply four layer compression as directed. Compression Stockings Add-Ons Electronic Signature(s) Signed: 05/10/2021 5:51:35 PM By: [name] Entered By: [name] on 05/10/2021 13:13:11 -------------------------------------------------------------------------------- Vitals Details Patient Name: Date of Service: [name] [name], [name] RDIN W. 05/10/2021 12:30 PM Medical Record Number: [id number] Patient Account Number: [id number] Date of Birth/Sex: Treating RN: February 18, 1956 (65 y.o. [name] Primary Care [place]: [name] Other Clinician: Referring Lis Savitt: Treating Greogry Goodwyn/Extender: [name] in Treatment: 76 Vital Signs Time Taken: 13:23 Temperature (F): 97.9 Height (in): 70 Pulse (bpm): 68 Weight (lbs): 360 Respiratory Rate (breaths/min): 22 Body Mass Index (BMI): 51.6 Blood Pressure (mmHg): 151/82 Reference Range: 80 - 120 mg / dl Electronic Signature(s) Signed: 05/10/2021 5:51:35 PM By: [name] Entered By: [name] on 05/10/2021 13:23:53

## 2021-05-11 NOTE — Progress Notes (Signed)
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 ankle. 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 Not surprisingly comes back in with wounds on his bilateral legs at the level of where his external  compression garment was tight superiorly. He tells me he is using his pumps once every second day. 7/20; 2-week follow-up the patient has not been using his compression pumps his wife was transferred to a new nursing home. He has 1 wound on the medial left lower leg and a small area on the right lateral 8/17; patient arrives today with only a smattering of small wounds on the left anterior lower leg most of these are eschared and look benign. There is nothing on his right leg. We have been using silver alginate under 4-layer compression 8/31; patient's wounds on the left anterior lower leg are larger. There is still nothing open on the right toe although we did put compression wraps on this last time. He has home health. Says he is used his compression pumps twice in the last 3 days. Obviously not sufficient 9/14; 2-week follow-up. We discharged him in his own zippered stocking. Apparently home health helped him change this and noted weeping edema fluid therefore put him back in a  wrap he arrives in the clinic today with no open wounds on the right innumerable small broken areas on the anterior left calf. Uncontrolled edema above the level of the wrap compatible with lymphedema 9/27 2-week follow-up. He arrives today with the left anterior lower extremity looking better. On the right he has a small open area posteriorly. I am going to put him back in compression on both sides. He claims compliance with his compression pumps at home twice a day he has home health changing his dressing 10/26; 1 month follow-up he has home health changing his dressings there is no open wound on the right leg he has extremitease stockings and external compression pumps which he says he is using once a day. The left leg is always been a more difficult site and although it is difficult to identify any precise open wound he has several leaking areas especially anteriorly and superiorly and at the edge of his wraps 11/9; 2-week follow-up. He has his extremities stocking on the right. He says he has been using his compression pumps once a day. He has 1 remaining wound on the left anterior mid tibia which looks healthy his edema control is otherwise good on the left 11/30; there is now a 3-week follow-up. We use his own compression stocking on the right last time as he has no open wounds. He apparently developed a UTI received a course of ciprofloxacin. He did not wear his compression pumps and I wonder whether he actually was using his stocking. Home health put a compression wrap on his right leg last week. He has multiple small open areas on the left anterior leg this week. 12/7; still a cluster of small areas on the left lateral calf and the right lateral calf. He has home health changing his dressings. For some reason he will not use compression pumps reliably this week because his wife spent 2 days in the hospital with anemia 12/21; he continues to have a cluster of small areas predominantly on the  left lateral but also the left anterior lower leg. More defined wounds on the right anterior and right medial. The he says he is using the compression pumps every other day. I have never understood the issue for this noncompliant. He still has home health coming out 1/11 the patient continues to have multiple small open areas on the left lateral left anterior and medial lower leg anteriorly. He also has a small punched-out painful area in  the crease of his left ankle which I think is probably a rapid injury. Very tender. I debrided in this area very adherent debris. Also small areas on the right lateral calf. We have been using silver alginate. His compression pump usage is probably marginal 2/8; the patient comes back in with his legs essentially in the same condition. He has multiple small areas on his bilateral lower extremities left greater than right anteriorly and posteriorly. A lot of these have small eschars on top some of them do not. His edema control is marginal. He says he uses his pumps once a day. He has home health changing his dressing 4-layer compression silver alginate as the primary dressing 3/1; Marked deterioration this visit. He has multiple small areas across the entirety of the lateral anterior and medial left lower leg. I am actually concerned that he may be on his way to a more substantive degree of breakdown in this area. He is using his compression pumps 4 times per week. He has home health changing this dressing week 3/29; again not much improvement from last time he is here significant bilateral small wounds for the most part skin breakdown. The larger areas are on the left lateral. Small areas bilaterally. He has poor edema control. Relates that he is not using his compression pumps with any reliability. He states his wife was in the hospital last week. He does not complain of chest pain or shortness of breath 4/26; patient presents for 1 month follow-up. He reports no  issues. He states that he last used his lymphedema pumps several weeks ago. He has been having his legs wrapped bilaterally with home health with 4-layer compression and silver alginate underneath. He has no complaints today. 5/31; patient presents for 1 month follow-up. He tolerates the compression wraps well. He states that home health continues to come out twice weekly T o change theses. He is unable to use his lymphedema pumps due to the fact that he has to take his shoes off. He states he cannot put his shoes on by himself. He denies any issues today. He denies signs of infection. 6/28; patient presents for 1 month follow-up. He has been tolerating compression wraps well. He states he developed a wound to the anterior part of his ankle due to the positioning of the padding placed by the home health nurse rubbing against the area. He states he noticed this getting worse in the last 2 weeks but this has since been corrected. He reports increased soreness to the specific area. Overall he is doing well 8/23; the patient arrives have not been seen here in almost 2 months. He still lives in [place]. He has [facility] home health twice a week that is the only dressing changes he gets. He reports that he wears these surgical sandals but does not feel he can get them on and off enough to use the pumps on a daily basis in fact he is only using them twice a week the rest of the time he leaves this very swollen left greater than right foot in the shoes. He arrives in clinic today with a cluster of small wounds all over the left leg. Very dry chronically inflamed skin in his left lower leg. He is actually better on the right although there are open areas laterally and posteriorly Electronic Signature(s) Signed: 05/11/2021 8:02:04 AM By: Linton Ham MD Entered By: Linton Ham on 05/10/2021 14:09:26 -------------------------------------------------------------------------------- Physical Exam  Details Patient Name: Date of Service: [name] [name],  [name] 05/10/2021 12:30 PM Medical Record Number: [id number] Patient Account Number: [id number] Date of Birth/Sex: Treating RN: 1956/06/28 (65 y.o. [name] Primary Care Provider: [name] Other Clinician: Referring Provider: Treating Provider/Extender: [name] in Treatment: 45 Constitutional Patient is hypertensive.. Pulse regular and within target range for patient.Marland Kitchen Respirations regular, non-labored and within target range.. Temperature is normal and within the target range for the patient.Marland Kitchen Appears in no distress. Cardiovascular . Notes ScatteredWound exam; bilateral lower extremities. Wounds over his left lower leg multiple open areas almost circumferentially. He has edema control in the legs but is left forefoot is very swollen with skin changes of chronic lymphedema. On the right he has small open areas laterally and posteriorly overall not too bad here. Edema control is not bad either. Electronic Signature(s) Signed: 05/11/2021 8:02:04 AM By: Linton Ham MD Entered By: Linton Ham on 05/10/2021 14:13:43 -------------------------------------------------------------------------------- Physician Orders Details Patient Name: Date of Service: [name] [name], [name] 05/10/2021 12:30 PM Medical Record Number: [id number] Patient Account Number: [id number] Date of Birth/Sex: Treating RN: 1955-12-27 (65 y.o. [name] Primary Care Provider: [name] Other Clinician: Referring Provider: Treating Provider/Extender: [name] in Treatment: 12 Verbal / Phone Orders: No Diagnosis Coding Follow-up Appointments Return appointment in 1 month. - DR. [facility] Bathing/ Shower/ Hygiene May shower with protection but do not get wound dressing(s) wet. Edema Control - Lymphedema / SCD / Other Lymphedema Pumps. Use Lymphedema pumps on leg(s) 2-3 times a day for  45-60 minutes. If wearing any wraps or hose, do not remove them. Continue exercising as instructed. Elevate legs to the level of the heart or above for 30 minutes daily and/or when sitting, a frequency of: Avoid standing for long periods of time. Exercise regularly [place] wound care orders this week; continue Home Health for wound care. May utilize formulary equivalent dressing for wound treatment orders unless otherwise specified. - [facility] 2 x a week please start putting triamcinolone cream to pt.'s BLE Wound Treatment Wound #65 - Lower Leg Wound Laterality: Left, Anterior, Distal Cleanser: Soap and Water (Home Health) 2 x Per Week/30 Days Discharge Instructions: May shower and wash wound with dial antibacterial soap and water prior to dressing change. Cleanser: Normal Saline (Home Health) (Generic) 2 x Per Week/30 Days Discharge Instructions: Cleanse the wound with Normal Saline prior to applying a clean dressing using gauze sponges, not tissue or cotton balls. Peri-Wound Care: Triamcinolone 15 (g) 2 x Per Week/30 Days Discharge Instructions: Use triamcinolone 15 (g) as directed Peri-Wound Care: Sween Lotion (Moisturizing lotion) (Home Health) (Generic) 2 x Per Week/30 Days Discharge Instructions: Apply moisturizing lotion as directed Prim Dressing: KerraCel Ag Gelling Fiber Dressing, 4x5 in (silver alginate) (Home Health) (Generic) 2 x Per Week/30 Days ary Discharge Instructions: Apply silver alginate to wound bed as instructed Secondary Dressing: Woven Gauze Sponge, Non-Sterile 4x4 in (Home Health) (Generic) 2 x Per Week/30 Days Discharge Instructions: Apply over primary dressing as directed. Secondary Dressing: ABD Pad, 5x9 (Home Health) 2 x Per Week/30 Days Discharge Instructions: Apply over primary dressing as directed. Compression Wrap: FourPress (4 layer compression wrap) (Home Health) (Generic) 2 x Per Week/30 Days Discharge Instructions: Apply four layer compression as  directed. Wound #68 - Lower Leg Wound Laterality: Left, Anterior, Proximal Cleanser: Soap and Water (Home Health) 2 x Per Week/30 Days Discharge Instructions: May shower and wash wound with dial antibacterial soap and water prior to dressing change. Cleanser: Normal Saline (Home  Health) (Generic) 2 x Per Week/30 Days Discharge Instructions: Cleanse the wound with Normal Saline prior to applying a clean dressing using gauze sponges, not tissue or cotton balls. Peri-Wound Care: Triamcinolone 15 (g) 2 x Per Week/30 Days Discharge Instructions: Use triamcinolone 15 (g) as directed Peri-Wound Care: Sween Lotion (Moisturizing lotion) (Home Health) (Generic) 2 x Per Week/30 Days Discharge Instructions: Apply moisturizing lotion as directed Prim Dressing: KerraCel Ag Gelling Fiber Dressing, 4x5 in (silver alginate) (Home Health) (Generic) 2 x Per Week/30 Days ary Discharge Instructions: Apply silver alginate to wound bed as instructed Secondary Dressing: Woven Gauze Sponge, Non-Sterile 4x4 in (Home Health) (Generic) 2 x Per Week/30 Days Discharge Instructions: Apply over primary dressing as directed. Secondary Dressing: ABD Pad, 5x9 (Home Health) 2 x Per Week/30 Days Discharge Instructions: Apply over primary dressing as directed. Compression Wrap: FourPress (4 layer compression wrap) (Home Health) (Generic) 2 x Per Week/30 Days Discharge Instructions: Apply four layer compression as directed. Wound #73 - Lower Leg Wound Laterality: Right, Lateral Cleanser: Soap and Water (Home Health) 2 x Per Week/30 Days Discharge Instructions: May shower and wash wound with dial antibacterial soap and water prior to dressing change. Cleanser: Normal Saline (Home Health) (Generic) 2 x Per Week/30 Days Discharge Instructions: Cleanse the wound with Normal Saline prior to applying a clean dressing using gauze sponges, not tissue or cotton balls. Peri-Wound Care: Triamcinolone 15 (g) 2 x Per Week/30 Days Discharge  Instructions: Use triamcinolone 15 (g) as directed Peri-Wound Care: Sween Lotion (Moisturizing lotion) (Home Health) (Generic) 2 x Per Week/30 Days Discharge Instructions: Apply moisturizing lotion as directed Prim Dressing: KerraCel Ag Gelling Fiber Dressing, 4x5 in (silver alginate) (Home Health) (Generic) 2 x Per Week/30 Days ary Discharge Instructions: Apply silver alginate to wound bed as instructed Secondary Dressing: Woven Gauze Sponge, Non-Sterile 4x4 in (Home Health) (Generic) 2 x Per Week/30 Days Discharge Instructions: Apply over primary dressing as directed. Secondary Dressing: ABD Pad, 5x9 (Home Health) 2 x Per Week/30 Days Discharge Instructions: Apply over primary dressing as directed. Compression Wrap: FourPress (4 layer compression wrap) (Home Health) (Generic) 2 x Per Week/30 Days Discharge Instructions: Apply four layer compression as directed. Wound #77 - Lower Leg Wound Laterality: Left, Lateral, Proximal Cleanser: Soap and Water (Home Health) 2 x Per Week/30 Days Discharge Instructions: May shower and wash wound with dial antibacterial soap and water prior to dressing change. Cleanser: Normal Saline (Home Health) (Generic) 2 x Per Week/30 Days Discharge Instructions: Cleanse the wound with Normal Saline prior to applying a clean dressing using gauze sponges, not tissue or cotton balls. Peri-Wound Care: Triamcinolone 15 (g) 2 x Per Week/30 Days Discharge Instructions: Use triamcinolone 15 (g) as directed Peri-Wound Care: Sween Lotion (Moisturizing lotion) (Home Health) (Generic) 2 x Per Week/30 Days Discharge Instructions: Apply moisturizing lotion as directed Prim Dressing: KerraCel Ag Gelling Fiber Dressing, 4x5 in (silver alginate) (Home Health) (Generic) 2 x Per Week/30 Days ary Discharge Instructions: Apply silver alginate to wound bed as instructed Secondary Dressing: Woven Gauze Sponge, Non-Sterile 4x4 in (Home Health) (Generic) 2 x Per Week/30 Days Discharge  Instructions: Apply over primary dressing as directed. Secondary Dressing: ABD Pad, 5x9 (Home Health) 2 x Per Week/30 Days Discharge Instructions: Apply over primary dressing as directed. Compression Wrap: FourPress (4 layer compression wrap) (Home Health) (Generic) 2 x Per Week/30 Days Discharge Instructions: Apply four layer compression as directed. Wound #78 - Lower Leg Wound Laterality: Right, Posterior Cleanser: Soap and Water (Home Health) 2 x Per Week/30 Days  Discharge Instructions: May shower and wash wound with dial antibacterial soap and water prior to dressing change. Cleanser: Normal Saline (Home Health) (Generic) 2 x Per Week/30 Days Discharge Instructions: Cleanse the wound with Normal Saline prior to applying a clean dressing using gauze sponges, not tissue or cotton balls. Peri-Wound Care: Triamcinolone 15 (g) 2 x Per Week/30 Days Discharge Instructions: Use triamcinolone 15 (g) as directed Peri-Wound Care: Sween Lotion (Moisturizing lotion) (Home Health) (Generic) 2 x Per Week/30 Days Discharge Instructions: Apply moisturizing lotion as directed Prim Dressing: KerraCel Ag Gelling Fiber Dressing, 4x5 in (silver alginate) (Home Health) (Generic) 2 x Per Week/30 Days ary Discharge Instructions: Apply silver alginate to wound bed as instructed Secondary Dressing: Woven Gauze Sponge, Non-Sterile 4x4 in (Home Health) (Generic) 2 x Per Week/30 Days Discharge Instructions: Apply over primary dressing as directed. Secondary Dressing: ABD Pad, 5x9 (Home Health) 2 x Per Week/30 Days Discharge Instructions: Apply over primary dressing as directed. Compression Wrap: FourPress (4 layer compression wrap) (Home Health) (Generic) 2 x Per Week/30 Days Discharge Instructions: Apply four layer compression as directed. Patient Medications llergies: No Known Allergies A Notifications Medication Indication Start End stasis dermatitis 05/10/2021 triamcinolone acetonide DOSE topical 0.1 % cream -  cream topical to skin on bilateral lower legs with dressing changes Electronic Signature(s) Signed: 05/10/2021 2:17:55 PM By: Linton Ham MD Entered By: Linton Ham on 05/10/2021 14:17:51 -------------------------------------------------------------------------------- Problem List Details Patient Name: Date of Service: [name] [name], [name] 05/10/2021 12:30 PM Medical Record Number: [id number] Patient Account Number: [id number] Date of Birth/Sex: Treating RN: August 26, 1956 (65 y.o. 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 05/10/2021 12:30 PM Medical Record Number: [id number] Patient Account Number: [id number] Date of Birth/Sex: Treating RN: Oct 24, 1955 (65 y.o. [name], Lauren Primary Care Provider: [name] Other Clinician: Referring Provider: Treating Provider/Extender: [name] in Treatment:  76 Subjective History of Present Illness (HPI) The following HPI elements were documented for the patient's wound: Location: both legs Quality: Patient reports No Pain. long history of chronic venous hypertension,chf,morbid obesity. s/p gsv ablation. 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 sleep apnea andhbp. breakdown of skin both legs around 2 months ago. treated here for this in 2015. no .dm. 05/09/2016 -- he had his arterial studies done last week and his right ABI was 1.3 and his left ABI was 1.4. His right toe brachial index was 0.98 and on the left was 0.9. Venous studies have only be done today and reports are awaited. 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 Donnetta Hutching. He had a arterial study done which  showed a right ABI of 1.3 which is within normal limits at rest and a left ABI of 1.4 which is within normal limits at rest and may be falsely elevated. The right toe brachial index was 0.98 and the left toe brachial index was 0.90 05/30/2016 -- seen by Dr. Althea Charon -- is known to have a prior laser ablation of his left great saphenous vein in 2009. Prior to that he had 2 ablations of the odd same vein by interventional radiology. As noted to have severe venous hypertension bilaterally. 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 His right great saphenous vein is somewhat dilated but no evidence of reflux. The only deep venous reflux demonstrated was in his left common femoral vein. After every consideration Dr. Donnetta Hutching recommended reattempt at ablation versus removal of his left great saphenous vein in the operating room with the standard vein stripping technique. The patient would consider this and let him know again. 06/20/16 patient continues to wear a juxtalite on the right leg  without any open areas. On the lateral aspect of his left leg he has 4 wounds and a small area on the medial area of the left leg. Using silver alginate under Profore 06/27/16 still no open areas on the right leg. On the lateral aspect of his left leg he has 4 wounds which continue to have a nonviable surface and wheeze been using silver alginate under Profore 07/04/2016 -- patient hasn't yet to contact his vascular surgeon regarding plans for surgical intervention and I believe he is trying his best to avoid surgery. I have again discussed with him the futility of trying to heal this and keep it healed, if he does not agree to surgical intervention 07/11/2016 -- the patient has not had any juxta light ordered for at least 3 years and we will order him a pair. 08/08/2016 -- he has been approved for Apligraf and they will get this ready for him next week 08/15/2016 -- he has his first Apligraf applied today 08/29/2016 -- he has had his second application of Apligraf today 09/12/2016 -- his Apligraf has not arrived today due to the holiday 09/19/2016 -- he has had his third application of Apligraf today 10/03/2016 -- he has had his fourth application of Apligraf today. 10/18/2016 -- his next Apligraf has not arrived today. He has had chronic problems with his back and was to start on steroids and I have told him there are no objections against this. He is also taking appropriate medications as per his orthopedic doctor. 10/24/2016 -- he is here for his fifth application of Apligraf today. 01/09/2017 -- is been having repeated falls and problems with his back and saw a spine surgeon who has recommended holding his anticoagulation and will have some epidural injections in a few weeks. 03/13/2017 - he had been doing very well with his left lower extremity and the ulcerations that come down significantly. However last week he may have hit himself against a metal cabinet and has started having abrasions  and because of this has started weeping from the right lateral calf. He has not used his compression since morning and his right lower extremity has markedly increased and lymphedema 03/27/17; the patient appears to be doing very well only a small cluster of wounds on the right lateral lower extremity. Most of the areas on his left anterior and left posterior leg are closed the wrong way to closing. His compression slipped  down today there is irritation where the wrap edge was but no evidence of infection 04/24/2017 -- the patient has been using his lymphedema pumps and is also wearing his new compression on the right lower extremity. 05/01/2017 -- he has begun using his lymphedema pumps for a longer period of time but unfortunately had a fall and may have bruised his left lateral lower extremity under the 4-layer compression wrap and has multiple ulcerations in this area today 06/05/2017 -- after examination today he is noted to have taken a significant turn for the worse with multiple open ulcerations on his left lower calf and anterior leg. Lymphedema is better controlled and there is no evidence of cellulitis. I believe the patient is not being compliant with his lymphedema pumps. 06/12/2017 -- the patient did not come for his nurse visit change to the left lower extremity on Friday, as advised. He has not been doing his compression appropriately and now has developed a ulcerated area on the right lower extremity. He also has not been using his lymphedema pumps appropriately. in addition to this the patient tells me that he and his wife are going to the beach this coming Sunday for over a week 06/26/2017 -- the patient is back after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 against the area. He states he noticed this getting worse in the last 2 weeks but this has since been corrected. He reports increased soreness to the specific area. Overall he is doing well 8/23; the patient arrives have not been seen here in almost 2 months. He still lives in [place]. He has [facility] home health twice a week that is the only dressing changes he gets. He reports that he wears these surgical sandals but does not feel he can get them on and off enough to use the pumps on a daily basis in fact he is only using them twice a week the rest of the time he leaves this very swollen left greater than right foot in the shoes. He arrives in clinic today with a cluster of small wounds all over the left leg. Very dry chronically inflamed skin in his left lower leg. He is actually better on the right although there are open areas laterally and posteriorly Objective Constitutional Patient is hypertensive.. Pulse regular and within target range for patient.Marland Kitchen Respirations regular, non-labored and within target range.. Temperature is normal and within the target range for the patient.Marland Kitchen Appears in no distress. Vitals Time Taken: 1:23 PM, Height: 70 in, Weight: 360 lbs, BMI: 51.6, Temperature: 97.9 F, Pulse: 68 bpm, Respiratory Rate: 22 breaths/min, Blood Pressure: 151/82 mmHg. General Notes: ScatteredWound exam; bilateral lower extremities. Wounds over his left lower leg multiple open areas almost circumferentially. He has edema control in the legs but is  left forefoot is very swollen with skin changes of chronic lymphedema. On the right he has small open areas laterally and posteriorly overall not too bad here. Edema control is not bad either. Integumentary (Hair, Skin) Wound #65 status is Open. Original cause of wound was Gradually Appeared. The date acquired was: 09/08/2019. The wound has been in treatment 76 weeks. The wound is located on the [facility] Lower Leg. The wound measures 0.4cm length x 0.6cm width x 0.2cm depth; 0.188cm^2 area and 0.038cm^3 volume. There is Fat Layer (Subcutaneous Tissue) exposed. There is no tunneling or undermining noted. There is a medium amount of purulent drainage noted. The wound margin is distinct with the outline attached to the wound base. There is large (67-100%) pink granulation within the wound bed. There is  a small (1- 33%) amount of necrotic tissue within the wound bed including Adherent Slough. Wound #68 status is Open. Original cause of wound was Gradually Appeared. The date acquired was: 03/23/2020. The wound has been in treatment 59 weeks. The wound is located on the Left,Proximal,Anterior Lower Leg. The wound measures 8cm length x 12cm width x 0.1cm depth; 75.398cm^2 area and 7.54cm^3 volume. There is Fat Layer (Subcutaneous Tissue) exposed. There is no tunneling or undermining noted. There is a small amount of serosanguineous drainage noted. The wound margin is distinct with the outline attached to the wound base. There is large (67-100%) red granulation within the wound bed. There is no necrotic tissue within the wound bed. Wound #73 status is Open. Original cause of wound was Gradually Appeared. The date acquired was: 08/03/2020. The wound has been in treatment 38 weeks. The wound is located on the Right,Lateral Lower Leg. The wound measures 1.2cm length x 0.5cm width x 0.1cm depth; 0.471cm^2 area and 0.047cm^3 volume. There is Fat Layer (Subcutaneous Tissue) exposed. There is no tunneling  or undermining noted. There is a medium amount of serosanguineous drainage noted. The wound margin is distinct with the outline attached to the wound base. There is large (67-100%) red, pink granulation within the wound bed. There is no necrotic tissue within the wound bed. Wound #77 status is Open. Original cause of wound was Gradually Appeared. The date acquired was: 11/16/2020. The wound has been in treatment 25 weeks. The wound is located on the Left,Proximal,Lateral Lower Leg. The wound measures 2.8cm length x 2.3cm width x 0.1cm depth; 5.058cm^2 area and 0.506cm^3 volume. There is Fat Layer (Subcutaneous Tissue) exposed. There is no tunneling or undermining noted. There is a medium amount of serosanguineous drainage noted. The wound margin is distinct with the outline attached to the wound base. There is large (67-100%) red granulation within the wound bed. There is a small (1-33%) amount of necrotic tissue within the wound bed including Adherent Slough. Wound #78 status is Open. Original cause of wound was Gradually Appeared. The date acquired was: 11/16/2020. The wound has been in treatment 25 weeks. The wound is located on the Right,Posterior Lower Leg. The wound measures 1cm length x 0.6cm width x 0.1cm depth; 0.471cm^2 area and 0.047cm^3 volume. There is Fat Layer (Subcutaneous Tissue) exposed. There is a medium amount of serosanguineous drainage noted. The wound margin is distinct with the outline attached to the wound base. There is large (67-100%) red granulation within the wound bed. There is no necrotic tissue within the wound bed. 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 There was a Four Layer Compression Therapy Procedure by Rhae Hammock, RN. Post procedure Diagnosis Wound #65: Same as Pre-Procedure Wound #68 Pre-procedure diagnosis of Wound #68 is a Lymphedema located on the Left,Proximal,Anterior Lower Leg . There was a Four Layer Compression Therapy Procedure by Rhae Hammock, RN. Post procedure Diagnosis Wound #68: Same as Pre-Procedure Wound #73 Pre-procedure diagnosis of Wound #73 is a Lymphedema located on the Right,Lateral Lower Leg . There was a Four Layer Compression Therapy Procedure by Rhae Hammock, RN. Post procedure Diagnosis Wound #73: Same  as Pre-Procedure Wound #77 Pre-procedure diagnosis of Wound #77 is a Lymphedema located on the Left,Proximal,Lateral Lower Leg . There was a Four Layer Compression Therapy Procedure by Rhae Hammock, RN. Post procedure Diagnosis Wound #77: Same as Pre-Procedure Wound #78 Pre-procedure diagnosis of Wound #78 is a Lymphedema located on the Right,Posterior Lower Leg . There was a Four Layer Compression Therapy Procedure by Rhae Hammock, RN. Post procedure Diagnosis Wound #78: Same as Pre-Procedure Plan Follow-up Appointments: Return appointment in 1 month. - DR. Dellia Nims Bathing/ Shower/ Hygiene: May shower with protection but do not get wound dressing(s) wet. Edema Control - Lymphedema / SCD / Other: Lymphedema Pumps. Use Lymphedema pumps on leg(s) 2-3 times a day for 45-60 minutes. If wearing any wraps or hose, do not remove them. Continue exercising as instructed. Elevate legs to the level of the heart or above for 30 minutes daily and/or when sitting, a frequency of: Avoid standing for long periods of time. Exercise regularly Home Health: New wound care orders this week; continue Home Health for wound care. May utilize formulary equivalent  dressing for wound treatment orders unless otherwise specified. - [facility] 2 x a week please start putting triamcinolone cream to pt.'s BLE The following medication(s) was prescribed: triamcinolone acetonide topical 0.1 % cream cream topical to skin on bilateral lower legs with dressing changes for stasis dermatitis starting 05/10/2021 WOUND #65: - Lower Leg Wound Laterality: Left, Anterior, Distal Cleanser: Soap and Water (Home Health) 2 x Per Week/30 Days Discharge Instructions: May shower and wash wound with dial antibacterial soap and water prior to dressing change. Cleanser: Normal Saline (Home Health) (Generic) 2 x Per Week/30 Days Discharge Instructions: Cleanse the wound with Normal Saline prior to applying a clean dressing using gauze sponges, not tissue or cotton balls. Peri-Wound Care: Triamcinolone 15 (g) 2 x Per Week/30 Days Discharge Instructions: Use triamcinolone 15 (g) as directed Peri-Wound Care: Sween Lotion (Moisturizing lotion) (Home Health) (Generic) 2 x Per Week/30 Days Discharge Instructions: Apply moisturizing lotion as directed Prim Dressing: KerraCel Ag Gelling Fiber Dressing, 4x5 in (silver alginate) (Home Health) (Generic) 2 x Per Week/30 Days ary Discharge Instructions: Apply silver alginate to wound bed as instructed Secondary Dressing: Woven Gauze Sponge, Non-Sterile 4x4 in (Home Health) (Generic) 2 x Per Week/30 Days Discharge Instructions: Apply over primary dressing as directed. Secondary Dressing: ABD Pad, 5x9 (Home Health) 2 x Per Week/30 Days Discharge Instructions: Apply over primary dressing as directed. Com pression Wrap: FourPress (4 layer compression wrap) (Home Health) (Generic) 2 x Per Week/30 Days Discharge Instructions: Apply four layer compression as directed. WOUND #68: - Lower Leg Wound Laterality: Left, Anterior, Proximal Cleanser: Soap and Water (Home Health) 2 x Per Week/30 Days Discharge Instructions: May shower and wash wound with dial  antibacterial soap and water prior to dressing change. Cleanser: Normal Saline (Home Health) (Generic) 2 x Per Week/30 Days Discharge Instructions: Cleanse the wound with Normal Saline prior to applying a clean dressing using gauze sponges, not tissue or cotton balls. Peri-Wound Care: Triamcinolone 15 (g) 2 x Per Week/30 Days Discharge Instructions: Use triamcinolone 15 (g) as directed Peri-Wound Care: Sween Lotion (Moisturizing lotion) (Home Health) (Generic) 2 x Per Week/30 Days Discharge Instructions: Apply moisturizing lotion as directed Prim Dressing: KerraCel Ag Gelling Fiber Dressing, 4x5 in (silver alginate) (Home Health) (Generic) 2 x Per Week/30 Days ary Discharge Instructions: Apply silver alginate to wound bed as instructed Secondary Dressing: Woven Gauze Sponge, Non-Sterile 4x4 in (Home Health) (Generic) 2 x Per Week/30 Days Discharge  Instructions: Apply over primary dressing as directed. Secondary Dressing: ABD Pad, 5x9 (Home Health) 2 x Per Week/30 Days Discharge Instructions: Apply over primary dressing as directed. Com pression Wrap: FourPress (4 layer compression wrap) (Home Health) (Generic) 2 x Per Week/30 Days Discharge Instructions: Apply four layer compression as directed. WOUND #73: - Lower Leg Wound Laterality: Right, Lateral Cleanser: Soap and Water (Home Health) 2 x Per Week/30 Days Discharge Instructions: May shower and wash wound with dial antibacterial soap and water prior to dressing change. Cleanser: Normal Saline (Home Health) (Generic) 2 x Per Week/30 Days Discharge Instructions: Cleanse the wound with Normal Saline prior to applying a clean dressing using gauze sponges, not tissue or cotton balls. Peri-Wound Care: Triamcinolone 15 (g) 2 x Per Week/30 Days Discharge Instructions: Use triamcinolone 15 (g) as directed Peri-Wound Care: Sween Lotion (Moisturizing lotion) (Home Health) (Generic) 2 x Per Week/30 Days Discharge Instructions: Apply moisturizing  lotion as directed Prim Dressing: KerraCel Ag Gelling Fiber Dressing, 4x5 in (silver alginate) (Home Health) (Generic) 2 x Per Week/30 Days ary Discharge Instructions: Apply silver alginate to wound bed as instructed Secondary Dressing: Woven Gauze Sponge, Non-Sterile 4x4 in (Home Health) (Generic) 2 x Per Week/30 Days Discharge Instructions: Apply over primary dressing as directed. Secondary Dressing: ABD Pad, 5x9 (Home Health) 2 x Per Week/30 Days Discharge Instructions: Apply over primary dressing as directed. Com pression Wrap: FourPress (4 layer compression wrap) (Home Health) (Generic) 2 x Per Week/30 Days Discharge Instructions: Apply four layer compression as directed. WOUND #77: - Lower Leg Wound Laterality: Left, Lateral, Proximal Cleanser: Soap and Water (Home Health) 2 x Per Week/30 Days Discharge Instructions: May shower and wash wound with dial antibacterial soap and water prior to dressing change. Cleanser: Normal Saline (Home Health) (Generic) 2 x Per Week/30 Days Discharge Instructions: Cleanse the wound with Normal Saline prior to applying a clean dressing using gauze sponges, not tissue or cotton balls. Peri-Wound Care: Triamcinolone 15 (g) 2 x Per Week/30 Days Discharge Instructions: Use triamcinolone 15 (g) as directed Peri-Wound Care: Sween Lotion (Moisturizing lotion) (Home Health) (Generic) 2 x Per Week/30 Days Discharge Instructions: Apply moisturizing lotion as directed Prim Dressing: KerraCel Ag Gelling Fiber Dressing, 4x5 in (silver alginate) (Home Health) (Generic) 2 x Per Week/30 Days ary Discharge Instructions: Apply silver alginate to wound bed as instructed Secondary Dressing: Woven Gauze Sponge, Non-Sterile 4x4 in (Home Health) (Generic) 2 x Per Week/30 Days Discharge Instructions: Apply over primary dressing as directed. Secondary Dressing: ABD Pad, 5x9 (Home Health) 2 x Per Week/30 Days Discharge Instructions: Apply over primary dressing as directed. Com  pression Wrap: FourPress (4 layer compression wrap) (Home Health) (Generic) 2 x Per Week/30 Days Discharge Instructions: Apply four layer compression as directed. WOUND #78: - Lower Leg Wound Laterality: Right, Posterior Cleanser: Soap and Water (Home Health) 2 x Per Week/30 Days Discharge Instructions: May shower and wash wound with dial antibacterial soap and water prior to dressing change. Cleanser: Normal Saline (Home Health) (Generic) 2 x Per Week/30 Days Discharge Instructions: Cleanse the wound with Normal Saline prior to applying a clean dressing using gauze sponges, not tissue or cotton balls. Peri-Wound Care: Triamcinolone 15 (g) 2 x Per Week/30 Days Discharge Instructions: Use triamcinolone 15 (g) as directed Peri-Wound Care: Sween Lotion (Moisturizing lotion) (Home Health) (Generic) 2 x Per Week/30 Days Discharge Instructions: Apply moisturizing lotion as directed Prim Dressing: KerraCel Ag Gelling Fiber Dressing, 4x5 in (silver alginate) (Home Health) (Generic) 2 x Per Week/30 Days ary Discharge Instructions: Apply  silver alginate to wound bed as instructed Secondary Dressing: Woven Gauze Sponge, Non-Sterile 4x4 in (Home Health) (Generic) 2 x Per Week/30 Days Discharge Instructions: Apply over primary dressing as directed. Secondary Dressing: ABD Pad, 5x9 (Home Health) 2 x Per Week/30 Days Discharge Instructions: Apply over primary dressing as directed. Com pression Wrap: FourPress (4 layer compression wrap) (Home Health) (Generic) 2 x Per Week/30 Days Discharge Instructions: Apply four layer compression as directed. 1. Liberal TCA which I will prescribe for use by home health on the skin in both lower legs for the severe left greater than right stasis dermatitis 2. Still silver alginate 3. I am not sure what to do about the issue of changing his shoes. He says he cannot bend over to Velcro his shoes because he feels he will fall. Therefore over using the compression pumps twice a  week. I was not able to see a way around this Electronic Signature(s) Signed: 05/10/2021 2:18:34 PM By: Linton Ham MD Entered By: Linton Ham on 05/10/2021 14:18:34 -------------------------------------------------------------------------------- SuperBill Details Patient Name: Date of Service: [name] [name], [name] 05/10/2021 Medical Record Number: [id number] Patient Account Number: [id number] Date of Birth/Sex: Treating RN: 1956-01-07 (65 y.o. [name], Lauren Primary Care Provider: [name] Other Clinician: Referring Provider: Treating Provider/Extender: [name] in Treatment: 76 Diagnosis Coding ICD-10 Codes Code Description [phone] Chronic venous hypertension (idiopathic) with ulcer and inflammation of bilateral lower extremity I89.0 Lymphedema, not elsewhere classified L97.821 Non-pressure chronic ulcer of other part of left lower leg limited to breakdown of skin L97.811 Non-pressure chronic ulcer of other part of right lower leg limited to breakdown of skin Facility Procedures CPT4: Code [id number] Description: 81 BILATERAL: Application of multi-layer venous compression system; leg (below knee), including ankle and t. Modifier: Quantity: 1 Physician Procedures : CPT4 Code Description Modifier [id number] - WC PHYS LEVEL 3 - EST PT ICD-10 Diagnosis Description L97.821 Non-pressure chronic ulcer of other part of left lower leg limited to breakdown of skin L97.811 Non-pressure chronic ulcer of other part of  right lower leg limited to breakdown of skin I89.0 Lymphedema, not elsewhere classified I87.333 Chronic venous hypertension (idiopathic) with ulcer and inflammation of bilateral lower extremity Quantity: 1 Electronic Signature(s) Signed: 05/10/2021 5:31:05 PM By: Rhae Hammock RN Signed: 05/11/2021 8:02:04 AM By: Linton Ham MD Entered By: Rhae Hammock on 05/10/2021 17:16:22

## 2021-05-12 DIAGNOSIS — Z7902 Long term (current) use of antithrombotics/antiplatelets: Secondary | ICD-10-CM | POA: Diagnosis not present

## 2021-05-12 DIAGNOSIS — Z7982 Long term (current) use of aspirin: Secondary | ICD-10-CM | POA: Diagnosis not present

## 2021-05-12 DIAGNOSIS — Z8673 Personal history of transient ischemic attack (TIA), and cerebral infarction without residual deficits: Secondary | ICD-10-CM | POA: Diagnosis not present

## 2021-05-12 DIAGNOSIS — L97821 Non-pressure chronic ulcer of other part of left lower leg limited to breakdown of skin: Secondary | ICD-10-CM | POA: Diagnosis not present

## 2021-05-12 DIAGNOSIS — I5032 Chronic diastolic (congestive) heart failure: Secondary | ICD-10-CM | POA: Diagnosis not present

## 2021-05-12 DIAGNOSIS — Z9981 Dependence on supplemental oxygen: Secondary | ICD-10-CM | POA: Diagnosis not present

## 2021-05-12 DIAGNOSIS — E669 Obesity, unspecified: Secondary | ICD-10-CM | POA: Diagnosis not present

## 2021-05-12 DIAGNOSIS — L97811 Non-pressure chronic ulcer of other part of right lower leg limited to breakdown of skin: Secondary | ICD-10-CM | POA: Diagnosis not present

## 2021-05-12 DIAGNOSIS — G4733 Obstructive sleep apnea (adult) (pediatric): Secondary | ICD-10-CM | POA: Diagnosis not present

## 2021-05-12 DIAGNOSIS — I110 Hypertensive heart disease with heart failure: Secondary | ICD-10-CM | POA: Diagnosis not present

## 2021-05-12 DIAGNOSIS — Z791 Long term (current) use of non-steroidal anti-inflammatories (NSAID): Secondary | ICD-10-CM | POA: Diagnosis not present

## 2021-05-12 DIAGNOSIS — I87333 Chronic venous hypertension (idiopathic) with ulcer and inflammation of bilateral lower extremity: Secondary | ICD-10-CM | POA: Diagnosis not present

## 2021-05-14 DIAGNOSIS — L97821 Non-pressure chronic ulcer of other part of left lower leg limited to breakdown of skin: Secondary | ICD-10-CM | POA: Diagnosis not present

## 2021-05-14 DIAGNOSIS — I87333 Chronic venous hypertension (idiopathic) with ulcer and inflammation of bilateral lower extremity: Secondary | ICD-10-CM | POA: Diagnosis not present

## 2021-05-14 DIAGNOSIS — I5032 Chronic diastolic (congestive) heart failure: Secondary | ICD-10-CM | POA: Diagnosis not present

## 2021-05-14 DIAGNOSIS — G4733 Obstructive sleep apnea (adult) (pediatric): Secondary | ICD-10-CM | POA: Diagnosis not present

## 2021-05-14 DIAGNOSIS — I110 Hypertensive heart disease with heart failure: Secondary | ICD-10-CM | POA: Diagnosis not present

## 2021-05-14 DIAGNOSIS — L97811 Non-pressure chronic ulcer of other part of right lower leg limited to breakdown of skin: Secondary | ICD-10-CM | POA: Diagnosis not present

## 2021-05-17 DIAGNOSIS — L97821 Non-pressure chronic ulcer of other part of left lower leg limited to breakdown of skin: Secondary | ICD-10-CM | POA: Diagnosis not present

## 2021-05-17 DIAGNOSIS — I5032 Chronic diastolic (congestive) heart failure: Secondary | ICD-10-CM | POA: Diagnosis not present

## 2021-05-17 DIAGNOSIS — L97811 Non-pressure chronic ulcer of other part of right lower leg limited to breakdown of skin: Secondary | ICD-10-CM | POA: Diagnosis not present

## 2021-05-17 DIAGNOSIS — I87333 Chronic venous hypertension (idiopathic) with ulcer and inflammation of bilateral lower extremity: Secondary | ICD-10-CM | POA: Diagnosis not present

## 2021-05-17 DIAGNOSIS — G4733 Obstructive sleep apnea (adult) (pediatric): Secondary | ICD-10-CM | POA: Diagnosis not present

## 2021-05-17 DIAGNOSIS — I110 Hypertensive heart disease with heart failure: Secondary | ICD-10-CM | POA: Diagnosis not present

## 2021-05-18 DIAGNOSIS — E1121 Type 2 diabetes mellitus with diabetic nephropathy: Secondary | ICD-10-CM | POA: Diagnosis not present

## 2021-05-18 DIAGNOSIS — I110 Hypertensive heart disease with heart failure: Secondary | ICD-10-CM | POA: Diagnosis not present

## 2021-05-18 DIAGNOSIS — I5033 Acute on chronic diastolic (congestive) heart failure: Secondary | ICD-10-CM | POA: Diagnosis not present

## 2021-05-18 DIAGNOSIS — N200 Calculus of kidney: Secondary | ICD-10-CM | POA: Diagnosis not present

## 2021-05-21 DIAGNOSIS — I110 Hypertensive heart disease with heart failure: Secondary | ICD-10-CM | POA: Diagnosis not present

## 2021-05-21 DIAGNOSIS — I87333 Chronic venous hypertension (idiopathic) with ulcer and inflammation of bilateral lower extremity: Secondary | ICD-10-CM | POA: Diagnosis not present

## 2021-05-21 DIAGNOSIS — G4733 Obstructive sleep apnea (adult) (pediatric): Secondary | ICD-10-CM | POA: Diagnosis not present

## 2021-05-21 DIAGNOSIS — L97811 Non-pressure chronic ulcer of other part of right lower leg limited to breakdown of skin: Secondary | ICD-10-CM | POA: Diagnosis not present

## 2021-05-21 DIAGNOSIS — L97821 Non-pressure chronic ulcer of other part of left lower leg limited to breakdown of skin: Secondary | ICD-10-CM | POA: Diagnosis not present

## 2021-05-21 DIAGNOSIS — I5032 Chronic diastolic (congestive) heart failure: Secondary | ICD-10-CM | POA: Diagnosis not present

## 2021-05-24 DIAGNOSIS — I110 Hypertensive heart disease with heart failure: Secondary | ICD-10-CM | POA: Diagnosis not present

## 2021-05-24 DIAGNOSIS — G4733 Obstructive sleep apnea (adult) (pediatric): Secondary | ICD-10-CM | POA: Diagnosis not present

## 2021-05-24 DIAGNOSIS — I5032 Chronic diastolic (congestive) heart failure: Secondary | ICD-10-CM | POA: Diagnosis not present

## 2021-05-24 DIAGNOSIS — I87333 Chronic venous hypertension (idiopathic) with ulcer and inflammation of bilateral lower extremity: Secondary | ICD-10-CM | POA: Diagnosis not present

## 2021-05-24 DIAGNOSIS — L97811 Non-pressure chronic ulcer of other part of right lower leg limited to breakdown of skin: Secondary | ICD-10-CM | POA: Diagnosis not present

## 2021-05-24 DIAGNOSIS — L97821 Non-pressure chronic ulcer of other part of left lower leg limited to breakdown of skin: Secondary | ICD-10-CM | POA: Diagnosis not present

## 2021-05-28 DIAGNOSIS — I110 Hypertensive heart disease with heart failure: Secondary | ICD-10-CM | POA: Diagnosis not present

## 2021-05-28 DIAGNOSIS — L97811 Non-pressure chronic ulcer of other part of right lower leg limited to breakdown of skin: Secondary | ICD-10-CM | POA: Diagnosis not present

## 2021-05-28 DIAGNOSIS — I5032 Chronic diastolic (congestive) heart failure: Secondary | ICD-10-CM | POA: Diagnosis not present

## 2021-05-28 DIAGNOSIS — I87333 Chronic venous hypertension (idiopathic) with ulcer and inflammation of bilateral lower extremity: Secondary | ICD-10-CM | POA: Diagnosis not present

## 2021-05-28 DIAGNOSIS — G4733 Obstructive sleep apnea (adult) (pediatric): Secondary | ICD-10-CM | POA: Diagnosis not present

## 2021-05-28 DIAGNOSIS — L97821 Non-pressure chronic ulcer of other part of left lower leg limited to breakdown of skin: Secondary | ICD-10-CM | POA: Diagnosis not present

## 2021-05-30 DIAGNOSIS — L97811 Non-pressure chronic ulcer of other part of right lower leg limited to breakdown of skin: Secondary | ICD-10-CM | POA: Diagnosis not present

## 2021-05-30 DIAGNOSIS — I5032 Chronic diastolic (congestive) heart failure: Secondary | ICD-10-CM | POA: Diagnosis not present

## 2021-05-30 DIAGNOSIS — L97821 Non-pressure chronic ulcer of other part of left lower leg limited to breakdown of skin: Secondary | ICD-10-CM | POA: Diagnosis not present

## 2021-05-30 DIAGNOSIS — I87333 Chronic venous hypertension (idiopathic) with ulcer and inflammation of bilateral lower extremity: Secondary | ICD-10-CM | POA: Diagnosis not present

## 2021-05-30 DIAGNOSIS — G4733 Obstructive sleep apnea (adult) (pediatric): Secondary | ICD-10-CM | POA: Diagnosis not present

## 2021-05-30 DIAGNOSIS — I110 Hypertensive heart disease with heart failure: Secondary | ICD-10-CM | POA: Diagnosis not present

## 2021-06-02 DIAGNOSIS — L97821 Non-pressure chronic ulcer of other part of left lower leg limited to breakdown of skin: Secondary | ICD-10-CM | POA: Diagnosis not present

## 2021-06-02 DIAGNOSIS — G4733 Obstructive sleep apnea (adult) (pediatric): Secondary | ICD-10-CM | POA: Diagnosis not present

## 2021-06-02 DIAGNOSIS — I5032 Chronic diastolic (congestive) heart failure: Secondary | ICD-10-CM | POA: Diagnosis not present

## 2021-06-02 DIAGNOSIS — L97811 Non-pressure chronic ulcer of other part of right lower leg limited to breakdown of skin: Secondary | ICD-10-CM | POA: Diagnosis not present

## 2021-06-02 DIAGNOSIS — I110 Hypertensive heart disease with heart failure: Secondary | ICD-10-CM | POA: Diagnosis not present

## 2021-06-02 DIAGNOSIS — I87333 Chronic venous hypertension (idiopathic) with ulcer and inflammation of bilateral lower extremity: Secondary | ICD-10-CM | POA: Diagnosis not present

## 2021-06-06 DIAGNOSIS — I110 Hypertensive heart disease with heart failure: Secondary | ICD-10-CM | POA: Diagnosis not present

## 2021-06-06 DIAGNOSIS — L97821 Non-pressure chronic ulcer of other part of left lower leg limited to breakdown of skin: Secondary | ICD-10-CM | POA: Diagnosis not present

## 2021-06-06 DIAGNOSIS — E669 Obesity, unspecified: Secondary | ICD-10-CM | POA: Diagnosis not present

## 2021-06-06 DIAGNOSIS — I87333 Chronic venous hypertension (idiopathic) with ulcer and inflammation of bilateral lower extremity: Secondary | ICD-10-CM | POA: Diagnosis not present

## 2021-06-06 DIAGNOSIS — G4733 Obstructive sleep apnea (adult) (pediatric): Secondary | ICD-10-CM | POA: Diagnosis not present

## 2021-06-06 DIAGNOSIS — Z9981 Dependence on supplemental oxygen: Secondary | ICD-10-CM | POA: Diagnosis not present

## 2021-06-06 DIAGNOSIS — I5032 Chronic diastolic (congestive) heart failure: Secondary | ICD-10-CM | POA: Diagnosis not present

## 2021-06-06 DIAGNOSIS — L97811 Non-pressure chronic ulcer of other part of right lower leg limited to breakdown of skin: Secondary | ICD-10-CM | POA: Diagnosis not present

## 2021-06-07 ENCOUNTER — Encounter (HOSPITAL_BASED_OUTPATIENT_CLINIC_OR_DEPARTMENT_OTHER): Payer: Medicare Other | Admitting: Internal Medicine

## 2021-06-07 DIAGNOSIS — L97811 Non-pressure chronic ulcer of other part of right lower leg limited to breakdown of skin: Secondary | ICD-10-CM | POA: Diagnosis not present

## 2021-06-07 DIAGNOSIS — I5032 Chronic diastolic (congestive) heart failure: Secondary | ICD-10-CM | POA: Diagnosis not present

## 2021-06-07 DIAGNOSIS — L97821 Non-pressure chronic ulcer of other part of left lower leg limited to breakdown of skin: Secondary | ICD-10-CM | POA: Diagnosis not present

## 2021-06-07 DIAGNOSIS — G4733 Obstructive sleep apnea (adult) (pediatric): Secondary | ICD-10-CM | POA: Diagnosis not present

## 2021-06-07 DIAGNOSIS — I87333 Chronic venous hypertension (idiopathic) with ulcer and inflammation of bilateral lower extremity: Secondary | ICD-10-CM | POA: Diagnosis not present

## 2021-06-07 DIAGNOSIS — I110 Hypertensive heart disease with heart failure: Secondary | ICD-10-CM | POA: Diagnosis not present

## 2021-06-11 DIAGNOSIS — G4733 Obstructive sleep apnea (adult) (pediatric): Secondary | ICD-10-CM | POA: Diagnosis not present

## 2021-06-11 DIAGNOSIS — I110 Hypertensive heart disease with heart failure: Secondary | ICD-10-CM | POA: Diagnosis not present

## 2021-06-11 DIAGNOSIS — L97821 Non-pressure chronic ulcer of other part of left lower leg limited to breakdown of skin: Secondary | ICD-10-CM | POA: Diagnosis not present

## 2021-06-11 DIAGNOSIS — E669 Obesity, unspecified: Secondary | ICD-10-CM | POA: Diagnosis not present

## 2021-06-11 DIAGNOSIS — Z9981 Dependence on supplemental oxygen: Secondary | ICD-10-CM | POA: Diagnosis not present

## 2021-06-11 DIAGNOSIS — Z7902 Long term (current) use of antithrombotics/antiplatelets: Secondary | ICD-10-CM | POA: Diagnosis not present

## 2021-06-11 DIAGNOSIS — L97811 Non-pressure chronic ulcer of other part of right lower leg limited to breakdown of skin: Secondary | ICD-10-CM | POA: Diagnosis not present

## 2021-06-11 DIAGNOSIS — I5032 Chronic diastolic (congestive) heart failure: Secondary | ICD-10-CM | POA: Diagnosis not present

## 2021-06-11 DIAGNOSIS — Z7982 Long term (current) use of aspirin: Secondary | ICD-10-CM | POA: Diagnosis not present

## 2021-06-11 DIAGNOSIS — I87333 Chronic venous hypertension (idiopathic) with ulcer and inflammation of bilateral lower extremity: Secondary | ICD-10-CM | POA: Diagnosis not present

## 2021-06-11 DIAGNOSIS — Z791 Long term (current) use of non-steroidal anti-inflammatories (NSAID): Secondary | ICD-10-CM | POA: Diagnosis not present

## 2021-06-11 DIAGNOSIS — Z8673 Personal history of transient ischemic attack (TIA), and cerebral infarction without residual deficits: Secondary | ICD-10-CM | POA: Diagnosis not present

## 2021-06-14 ENCOUNTER — Encounter (HOSPITAL_BASED_OUTPATIENT_CLINIC_OR_DEPARTMENT_OTHER): Payer: Medicare Other | Attending: Internal Medicine | Admitting: Internal Medicine

## 2021-06-14 ENCOUNTER — Other Ambulatory Visit: Payer: Self-pay

## 2021-06-14 DIAGNOSIS — I509 Heart failure, unspecified: Secondary | ICD-10-CM | POA: Insufficient documentation

## 2021-06-14 DIAGNOSIS — E11621 Type 2 diabetes mellitus with foot ulcer: Secondary | ICD-10-CM | POA: Diagnosis not present

## 2021-06-14 DIAGNOSIS — I110 Hypertensive heart disease with heart failure: Secondary | ICD-10-CM | POA: Diagnosis not present

## 2021-06-14 DIAGNOSIS — L97812 Non-pressure chronic ulcer of other part of right lower leg with fat layer exposed: Secondary | ICD-10-CM | POA: Diagnosis not present

## 2021-06-14 DIAGNOSIS — I890 Lymphedema, not elsewhere classified: Secondary | ICD-10-CM | POA: Diagnosis not present

## 2021-06-14 DIAGNOSIS — L97822 Non-pressure chronic ulcer of other part of left lower leg with fat layer exposed: Secondary | ICD-10-CM | POA: Diagnosis not present

## 2021-06-14 DIAGNOSIS — L97811 Non-pressure chronic ulcer of other part of right lower leg limited to breakdown of skin: Secondary | ICD-10-CM | POA: Diagnosis not present

## 2021-06-14 DIAGNOSIS — L97821 Non-pressure chronic ulcer of other part of left lower leg limited to breakdown of skin: Secondary | ICD-10-CM | POA: Diagnosis not present

## 2021-06-14 DIAGNOSIS — Z6841 Body Mass Index (BMI) 40.0 and over, adult: Secondary | ICD-10-CM | POA: Insufficient documentation

## 2021-06-14 DIAGNOSIS — I87333 Chronic venous hypertension (idiopathic) with ulcer and inflammation of bilateral lower extremity: Secondary | ICD-10-CM | POA: Diagnosis not present

## 2021-06-14 NOTE — Progress Notes (Signed)
[name] ([id number]) , Visit Report for 06/14/2021 Arrival Information Details Patient Name: Date of Service: [name] Ro RDIN W. 06/14/2021 12:30 PM Medical Record Number: [id number] Patient Account Number: [id number] Date of Birth/Sex: Treating RN: [birth year] (65 y.o. [name] Primary Care [name]: [name] Other Clinician: Referring Vondell Sowell: Treating [name]/Extender: [name] in Treatment: 60 Visit Information History Since Last Visit Added or deleted any medications: No Patient Arrived: [name]: No Arrival Time: 12:40 Had a fall or experienced change in No Transfer Assistance: Manual activities of daily living that may affect Patient Identification Verified: Yes risk of falls: Secondary Verification Process Completed: Yes Signs or symptoms of abuse/neglect since No Patient Requires Transmission-Based Precautions: No last visito Patient Has Alerts: Yes Hospitalized since last visit: No Patient Alerts: R ABI = 1.25 Implantable device outside of the clinic No L ABI = 1.27 excluding cellular tissue based products placed in the center since last visit: Has Dressing in Place as Prescribed: Yes Has Compression in Place as Prescribed: Yes Has Footwear/Offloading in Place as Yes Prescribed: Left: Surgical Shoe with Pressure Relief Insole Right: Surgical Shoe with Pressure Relief Insole Pain Present Now: No Electronic Signature(s) Signed: 06/14/2021 4:47:08 PM By: [name] Entered By: [name] on 06/14/2021 12:44:46 -------------------------------------------------------------------------------- Compression Therapy Details Patient Name: Date of Service: [name] Ro RDIN W. 06/14/2021 12:30 PM Medical Record Number: [id number] Patient Account Number: [id number] Date of Birth/Sex: Treating RN: 26-Sep-1955 (65 y.o. [name] Primary Care Lunabella [name]: [name] Other  Clinician: Referring Deondrick Searls: Treating [name]/Extender: [name] in Treatment: 28 Compression Therapy Performed for Wound Assessment: Wound #65 Left,Distal,Anterior Lower Leg Performed By: Clinician [name], RN Compression Type: Four Layer Post Procedure Diagnosis Same as Pre-procedure Electronic Signature(s) Signed: 06/14/2021 4:47:08 PM By: [name] Entered By: [name] on 06/14/2021 13:21:40 -------------------------------------------------------------------------------- Compression Therapy Details Patient Name: Date of Service: [name] Ro RDIN W. 06/14/2021 12:30 PM Medical Record Number: [id number] Patient Account Number: [id number] Date of Birth/Sex: Treating RN: 11-02-1955 (65 y.o. [name] Primary Care [name]: [name] Other Clinician: Referring [name]: Treating Felipe Cabell/Extender: [name] in Treatment: 17 Compression Therapy Performed for Wound Assessment: Wound #68 Left,Proximal,Anterior Lower Leg Performed By: Clinician [name], RN Compression Type: Four Layer Post Procedure Diagnosis Same as Pre-procedure Electronic Signature(s) Signed: 06/14/2021 4:47:08 PM By: [name] Entered By: [name] on 06/14/2021 13:21:40 -------------------------------------------------------------------------------- Compression Therapy Details Patient Name: Date of Service: [name] Ro RDIN W. 06/14/2021 12:30 PM Medical Record Number: [id number] Patient Account Number: [id number] Date of Birth/Sex: Treating RN: 1956-01-09 (65 y.o. [name] Primary Care [name]: [name] Other Clinician: Referring [name]: Treating [name]/Extender: [name] in Treatment: 57 Compression Therapy Performed for Wound Assessment: Wound #73 Right,Lateral Lower Leg Performed By: Clinician [name], RN Compression Type: Four Layer Post Procedure  Diagnosis Same as Pre-procedure Electronic Signature(s) Signed: 06/14/2021 4:47:08 PM By: [name] Entered By: [name] on 06/14/2021 13:21:40 -------------------------------------------------------------------------------- Compression Therapy Details Patient Name: Date of Service: [name] Ro RDIN W. 06/14/2021 12:30 PM Medical Record Number: [id number] Patient Account Number: [id number] Date of Birth/Sex: Treating RN: 08/06/1956 (65 y.o. [name] Primary Care [name]: [name] Other Clinician: Referring [name]: Treating [name]/Extender: [name] in Treatment: 42 Compression Therapy Performed for Wound Assessment: Wound #77 Left,Proximal,Lateral Lower Leg Performed By: Clinician [name], RN Compression Type: Four Layer Post Procedure Diagnosis  Same as Pre-procedure Electronic Signature(s) Signed: 06/14/2021 4:47:08 PM By: [name] Entered By: [name] on 06/14/2021 13:21:40 -------------------------------------------------------------------------------- Encounter Discharge Information Details Patient Name: Date of Service: [name] [name], [name] 06/14/2021 12:30 PM Medical Record Number: [id number] Patient Account Number: [id number] Date of Birth/Sex: Treating RN: 1955-09-20 (65 y.o. [name] Primary Care [name]: [name] Other Clinician: Referring [name]: Treating [name]/Extender: [name] in Treatment: 35 Encounter Discharge Information Items Discharge Condition: Stable Ambulatory Status: [place] Discharge Destination: Home Transportation: Private Auto Schedule Follow-up Appointment: Yes Clinical Summary of Care: Provided on 06/14/2021 Form Type Recipient Paper Patient Patient Electronic Signature(s) Signed: 06/14/2021 4:47:08 PM By: [name] Entered By: [name] on 06/14/2021  14:00:43 -------------------------------------------------------------------------------- Lower Extremity Assessment Details Patient Name: Date of Service: [name] Ro RDIN W. 06/14/2021 12:30 PM Medical Record Number: [id number] Patient Account Number: [id number] Date of Birth/Sex: Treating RN: 07/26/1956 (65 y.o. [name] Primary Care Noralee Dutko: [name] Other Clinician: Referring Doris Mcgilvery: Treating Mahiya Kercheval/Extender: [name] in Treatment: 81 Edema Assessment Assessed: [name]: Yes] [name]: Yes] Edema: [Left: Yes] [Right: Yes] Calf Left: Right: Point of Measurement: 30 cm From Medial Instep 41 cm 41 cm Ankle Left: Right: Point of Measurement: 13 cm From Medial Instep 24.8 cm 27 cm Vascular Assessment Pulses: Dorsalis Pedis Palpable: [Left:Yes] [Right:Yes] Electronic Signature(s) Signed: 06/14/2021 4:47:08 PM By: [name] Entered By: [name] on 06/14/2021 12:54:36 -------------------------------------------------------------------------------- Multi Wound Chart Details Patient Name: Date of Service: [name] [name], [name] 06/14/2021 12:30 PM Medical Record Number: [id number] Patient Account Number: [id number] Date of Birth/Sex: Treating RN: [birth year] (65 y.o. [name] Primary Care Deaundra Dupriest: [name] Other Clinician: Referring Dulce Martian: Treating Haleema Vanderheyden/Extender: [name] in Treatment: 63 Vital Signs Height(in): 41 Pulse(bpm): 56 Weight(lbs): 360 Blood Pressure(mmHg): 158/82 Body Mass Index(BMI): 52 Temperature(F): 97.8 Respiratory Rate(breaths/min): 22 Photos: Left, Distal, Anterior Lower Leg Left, Proximal, Anterior Lower Leg Right, Lateral Lower Leg Wound Location: Gradually Appeared Gradually Appeared Gradually Appeared Wounding Event: Venous Leg Ulcer Lymphedema Lymphedema Primary Etiology: Lymphedema N/A N/A Secondary Etiology: Lymphedema, Sleep Apnea, Lymphedema,  Sleep Apnea, Lymphedema, Sleep Apnea, Comorbid History: Congestive Heart Failure, Congestive Heart Failure, Congestive Heart Failure, Hypertension, Peripheral Venous Hypertension, Peripheral Venous Hypertension, Peripheral Venous Disease, Osteoarthritis Disease, Osteoarthritis Disease, Osteoarthritis 09/08/2019 03/23/2020 08/03/2020 Date Acquired: 81 64 43 Weeks of Treatment: Open Open Open Wound Status: Yes Yes Yes Clustered Wound: 1 1 N/A Clustered Quantity: 0.2x0.3x0.1 0.4x0.5x0.1 0.6x0.4x0.1 Measurements L x W x D (cm) 0.047 0.157 0.188 A (cm) : rea 0.005 0.016 0.019 Volume (cm) : 100.00% 91.30% 99.80% % Reduction in Area: 100.00% 91.20% 99.80% % Reduction in Volume: Full Thickness Without Exposed Full Thickness Without Exposed Full Thickness Without Exposed Classification: Support Structures Support Structures Support Structures Medium Small Medium Exudate Amount: Purulent Serosanguineous Serosanguineous Exudate Type: yellow, brown, green red, brown red, brown Exudate Color: Distinct, outline attached Distinct, outline attached Distinct, outline attached Wound Margin: Large (67-100%) Large (67-100%) Large (67-100%) Granulation Amount: Pink Red Red Granulation Quality: None Present (0%) None Present (0%) None Present (0%) Necrotic Amount: Fat Layer (Subcutaneous Tissue): Yes Fat Layer (Subcutaneous Tissue): Yes Fat Layer (Subcutaneous Tissue): Yes Exposed Structures: Fascia: No Fascia: No Fascia: No Tendon: No Tendon: No Tendon: No Muscle: No Muscle: No Muscle: No Joint: No Joint: No Joint: No Bone: No Bone: No Bone: No Medium (34-66%) Large (67-100%) N/A Epithelialization: Compression Therapy Compression Therapy Compression Therapy Procedures Performed: Wound Number: [id number] 42 N/A Photos: No  Photos N/A Left, Proximal, Lateral Lower Leg Right, Posterior Lower Leg N/A Wound Location: Gradually Appeared Gradually Appeared N/A Wounding  Event: Lymphedema Lymphedema N/A Primary Etiology: N/A N/A N/A Secondary Etiology: Lymphedema, Sleep Apnea, Lymphedema, Sleep Apnea, N/A Comorbid History: Congestive Heart Failure, Congestive Heart Failure, Hypertension, Peripheral Venous Hypertension, Peripheral Venous Disease, Osteoarthritis Disease, Osteoarthritis 11/16/2020 11/16/2020 N/A Date Acquired: 30 30 N/A Weeks of Treatment: Open Healed - Epithelialized N/A Wound Status: No No N/A Clustered Wound: N/A N/A N/A Clustered Quantity: 0.3x0.3x0.1 0x0x0 N/A Measurements L x W x D (cm) 0.071 0 N/A A (cm) : rea 0.007 0 N/A Volume (cm) : 98.00% 100.00% N/A % Reduction in Area: 98.00% 100.00% N/A % Reduction in Volume: Full Thickness Without Exposed Full Thickness Without Exposed N/A Classification: Support Structures Support Structures Medium N/A N/A Exudate Amount: Serosanguineous N/A N/A Exudate Type: red, brown N/A N/A Exudate Color: Distinct, outline attached N/A N/A Wound Margin: Large (67-100%) N/A N/A Granulation Amount: Red N/A N/A Granulation Quality: None Present (0%) N/A N/A Necrotic Amount: Fat Layer (Subcutaneous Tissue): Yes N/A N/A Exposed Structures: Fascia: No Tendon: No Muscle: No Joint: No Bone: No Large (67-100%) N/A N/A Epithelialization: Compression Therapy N/A N/A Procedures Performed: Treatment Notes Electronic Signature(s) Signed: 06/14/2021 4:21:38 PM By: Linton Ham MD Signed: 06/14/2021 4:47:08 PM By: [name] Entered By: Linton Ham on 06/14/2021 13:36:50 -------------------------------------------------------------------------------- Multi-Disciplinary Care [name] Details Patient Name: Date of Service: [name] [name], [name] 06/14/2021 12:30 PM Medical Record Number: [id number] Patient Account Number: [id number] Date of Birth/Sex: Treating RN: 07/04/1956 (65 y.o. [name] Primary Care Allyn Bertoni: [name] Other Clinician: Referring  Yunuen Mordan: Treating Sayge Salvato/Extender: [name] in Treatment: 42 Active Inactive Venous Leg Ulcer Nursing Diagnoses: Knowledge deficit related to disease process and management Potential for venous Insuffiency (use before diagnosis confirmed) Goals: Patient will maintain optimal edema control Date Initiated: 01/11/2021 Target Resolution Date: 07/12/2021 Goal Status: Active Interventions: Assess peripheral edema status every visit. Compression as ordered Treatment Activities: Therapeutic compression applied : 01/11/2021 Notes: 06/14/21: Edema control ongoing. Wound/Skin Impairment Nursing Diagnoses: Knowledge deficit related to ulceration/compromised skin integrity Goals: Patient/caregiver will verbalize understanding of skin care regimen Date Initiated: 11/25/2019 Target Resolution Date: 07/12/2021 Goal Status: Active Ulcer/skin breakdown will have a volume reduction of 30% by week 4 Date Initiated: 11/25/2019 Date Inactivated: 01/06/2020 Target Resolution Date: 12/26/2019 Goal Status: Met Ulcer/skin breakdown will have a volume reduction of 50% by week 8 Date Initiated: 01/06/2020 Date Inactivated: 02/17/2020 Target Resolution Date: 02/05/2020 Goal Status: Met Interventions: Assess patient/caregiver ability to obtain necessary supplies Assess patient/caregiver ability to perform ulcer/skin care regimen upon admission and as needed Assess ulceration(s) every visit Notes: 06/14/21: Wound care regimen continues Electronic Signature(s) Signed: 06/14/2021 4:47:08 PM By: [name] Entered By: [name] on 06/14/2021 13:17:42 -------------------------------------------------------------------------------- Pain Assessment Details Patient Name: Date of Service: [name] Ro RDIN W. 06/14/2021 12:30 PM Medical Record Number: [id number] Patient Account Number: [id number] Date of Birth/Sex: Treating RN: [birth year] (65 y.o. [name] Primary Care  Danyelle Brookover: [name] Other Clinician: Referring Nelia Rogoff: Treating Jayziah Bankhead/Extender: [name] in Treatment: 96 Active Problems Location of Pain Severity and Description of Pain Patient Has Paino No Site Locations Pain Management and Medication Current Pain Management: Electronic Signature(s) Signed: 06/14/2021 4:47:08 PM By: [name] Entered By: [name] on 06/14/2021 12:45:30 -------------------------------------------------------------------------------- Patient/Caregiver Education Details Patient Name: Date of Service: [name] [name] 9/27/2022andnbsp12:30 PM Medical Record Number: [id number] Patient Account Number: [id number] Date of  Birth/Gender: Treating RN: [birth year] (65 y.o. [name] Primary Care Physician: [name] Other Clinician: Referring Physician: Treating Physician/Extender: [name] in Treatment: 4 Education Assessment Education Provided To: Patient Education Topics Provided Venous: Methods: Explain/Verbal, Printed Responses: State content correctly Wound/Skin Impairment: Methods: Explain/Verbal, Printed Responses: State content correctly Electronic Signature(s) Signed: 06/14/2021 4:47:08 PM By: [name] Entered By: [name] on 06/14/2021 13:18:09 -------------------------------------------------------------------------------- Wound Assessment Details Patient Name: Date of Service: [name] Ro RDIN W. 06/14/2021 12:30 PM Medical Record Number: [id number] Patient Account Number: [id number] Date of Birth/Sex: Treating RN: Jan 19, 1956 (65 y.o. [name] Primary Care Kalynne Womac: [name] Other Clinician: Referring Kenyette Gundy: Treating Laurisa Sahakian/Extender: [name] in Treatment: 81 Wound Status Wound Number: 65 Primary Venous Leg Ulcer Etiology: Wound Location: Left, Distal, Anterior Lower Leg Secondary Lymphedema Wounding  Event: Gradually Appeared Etiology: Date Acquired: 09/08/2019 Wound Open Weeks Of Treatment: 81 Status: Clustered Wound: Yes Comorbid Lymphedema, Sleep Apnea, Congestive Heart Failure, History: Hypertension, Peripheral Venous Disease, Osteoarthritis Photos Wound Measurements Length: (cm) 0. Width: (cm) 0. Depth: (cm) 0. Clustered Quantity: 1 Area: (cm) 0 Volume: (cm) 0 2 % Reduction in Area: 100% 3 % Reduction in Volume: 100% 1 Epithelialization: Medium (34-66%) Tunneling: No .047 Undermining: No .005 Wound Description Classification: Full Thickness Without Exposed Support Structures Wound Margin: Distinct, outline attached Exudate Amount: Medium Exudate Type: Purulent Exudate Color: yellow, brown, green Foul Odor After Cleansing: No Slough/Fibrino No Wound Bed Granulation Amount: Large (67-100%) Exposed Structure Granulation Quality: Pink Fascia Exposed: No Necrotic Amount: None Present (0%) Fat Layer (Subcutaneous Tissue) Exposed: Yes Tendon Exposed: No Muscle Exposed: No Joint Exposed: No Bone Exposed: No Treatment Notes Wound #65 (Lower Leg) Wound Laterality: Left, Anterior, Distal Cleanser Soap and Water Discharge Instruction: May shower and wash wound with dial antibacterial soap and water prior to dressing change. Normal Saline Discharge Instruction: Cleanse the wound with Normal Saline prior to applying a clean dressing using gauze sponges, not tissue or cotton balls. Peri-Wound Care Triamcinolone 15 (g) Discharge Instruction: Use triamcinolone 15 (g) as directed Sween Lotion (Moisturizing lotion) Discharge Instruction: Apply moisturizing lotion as directed Topical Primary Dressing KerraCel Ag Gelling Fiber Dressing, 4x5 in (silver alginate) Discharge Instruction: Apply silver alginate to wound bed as instructed Secondary Dressing Woven Gauze Sponge, Non-Sterile 4x4 in Discharge Instruction: Apply over primary dressing as directed. ABD Pad,  5x9 Discharge Instruction: Apply over primary dressing as directed. Secured With Compression Wrap FourPress (4 layer compression wrap) Discharge Instruction: Apply four layer compression as directed. Compression Stockings Add-Ons Electronic Signature(s) Signed: 06/14/2021 4:47:08 PM By: [name] Entered By: [name] on 06/14/2021 13:08:15 -------------------------------------------------------------------------------- Wound Assessment Details Patient Name: Date of Service: [name] [name], [name] 06/14/2021 12:30 PM Medical Record Number: [id number] Patient Account Number: [id number] Date of Birth/Sex: Treating RN: 11/05/1955 (65 y.o. [name] Primary Care Latrell Potempa: [name] Other Clinician: Referring Romano Stigger: Treating Pepper Wyndham/Extender: [name] in Treatment: 81 Wound Status Wound Number: 68 Primary Lymphedema Etiology: Wound Location: Left, Proximal, Anterior Lower Leg Wound Open Wounding Event: Gradually Appeared Status: Date Acquired: 03/23/2020 Comorbid Lymphedema, Sleep Apnea, Congestive Heart Failure, Weeks Of Treatment: 64 History: Hypertension, Peripheral Venous Disease, Osteoarthritis Clustered Wound: Yes Photos Wound Measurements Length: (cm) Width: (cm) Depth: (cm) Clustered Quantity: Area: (cm) Volume: (cm) 0.4 % Reduction in Area: 91.3% 0.5 % Reduction in Volume: 91.2% 0.1 Epithelialization: Large (67-100%) 1 Tunneling: No 0.157 Undermining: No 0.016 Wound Description Classification: Full Thickness Without Exposed Support  Structures Wound Margin: Distinct, outline attached Exudate Amount: Small Exudate Type: Serosanguineous Exudate Color: red, brown Foul Odor After Cleansing: No Slough/Fibrino No Wound Bed Granulation Amount: Large (67-100%) Exposed Structure Granulation Quality: Red Fascia Exposed: No Necrotic Amount: None Present (0%) Fat Layer (Subcutaneous Tissue) Exposed: Yes Tendon  Exposed: No Muscle Exposed: No Joint Exposed: No Bone Exposed: No Treatment Notes Wound #68 (Lower Leg) Wound Laterality: Left, Anterior, Proximal Cleanser Soap and Water Discharge Instruction: May shower and wash wound with dial antibacterial soap and water prior to dressing change. Normal Saline Discharge Instruction: Cleanse the wound with Normal Saline prior to applying a clean dressing using gauze sponges, not tissue or cotton balls. Peri-Wound Care Triamcinolone 15 (g) Discharge Instruction: Use triamcinolone 15 (g) as directed Sween Lotion (Moisturizing lotion) Discharge Instruction: Apply moisturizing lotion as directed Topical Primary Dressing KerraCel Ag Gelling Fiber Dressing, 4x5 in (silver alginate) Discharge Instruction: Apply silver alginate to wound bed as instructed Secondary Dressing Woven Gauze Sponge, Non-Sterile 4x4 in Discharge Instruction: Apply over primary dressing as directed. ABD Pad, 5x9 Discharge Instruction: Apply over primary dressing as directed. Secured With Compression Wrap FourPress (4 layer compression wrap) Discharge Instruction: Apply four layer compression as directed. Compression Stockings Add-Ons Electronic Signature(s) Signed: 06/14/2021 4:47:08 PM By: [name] Entered By: [name] on 06/14/2021 13:08:58 -------------------------------------------------------------------------------- Wound Assessment Details Patient Name: Date of Service: [name] [name], [name] 06/14/2021 12:30 PM Medical Record Number: [id number] Patient Account Number: [id number] Date of Birth/Sex: Treating RN: [birth year] (65 y.o. [name] Primary Care [address]: [name] Other Clinician: Referring Araeya Lamb: Treating Reather Steller/Extender: [name] in Treatment: 81 Wound Status Wound Number: 73 Primary Lymphedema Etiology: Wound Location: Right, Lateral Lower Leg Wound Open Wounding Event: Gradually  Appeared Status: Date Acquired: 08/03/2020 Comorbid Lymphedema, Sleep Apnea, Congestive Heart Failure, Weeks Of Treatment: 43 History: Hypertension, Peripheral Venous Disease, Osteoarthritis Clustered Wound: Yes Photos Wound Measurements Length: (cm) 0.6 Width: (cm) 0.4 Depth: (cm) 0.1 Area: (cm) 0.188 Volume: (cm) 0.019 % Reduction in Area: 99.8% % Reduction in Volume: 99.8% Tunneling: No Undermining: No Wound Description Classification: Full Thickness Without Exposed Support Structures Wound Margin: Distinct, outline attached Exudate Amount: Medium Exudate Type: Serosanguineous Exudate Color: red, brown Foul Odor After Cleansing: No Slough/Fibrino No Wound Bed Granulation Amount: Large (67-100%) Exposed Structure Granulation Quality: Red Fascia Exposed: No Necrotic Amount: None Present (0%) Fat Layer (Subcutaneous Tissue) Exposed: Yes Tendon Exposed: No Muscle Exposed: No Joint Exposed: No Bone Exposed: No Treatment Notes Wound #73 (Lower Leg) Wound Laterality: Right, Lateral Cleanser Soap and Water Discharge Instruction: May shower and wash wound with dial antibacterial soap and water prior to dressing change. Normal Saline Discharge Instruction: Cleanse the wound with Normal Saline prior to applying a clean dressing using gauze sponges, not tissue or cotton balls. Peri-Wound Care Triamcinolone 15 (g) Discharge Instruction: Use triamcinolone 15 (g) as directed Sween Lotion (Moisturizing lotion) Discharge Instruction: Apply moisturizing lotion as directed Topical Primary Dressing KerraCel Ag Gelling Fiber Dressing, 4x5 in (silver alginate) Discharge Instruction: Apply silver alginate to wound bed as instructed Secondary Dressing Woven Gauze Sponge, Non-Sterile 4x4 in Discharge Instruction: Apply over primary dressing as directed. ABD Pad, 5x9 Discharge Instruction: Apply over primary dressing as directed. Secured With Compression Wrap FourPress (4 layer  compression wrap) Discharge Instruction: Apply four layer compression as directed. Compression Stockings Add-Ons Electronic Signature(s) Signed: 06/14/2021 4:47:08 PM By: [name] Entered By: [name] on 06/14/2021 13:15:48 -------------------------------------------------------------------------------- Wound Assessment Details Patient Name: Date of Service:  [name] [name], [name] 06/14/2021 12:30 PM Medical Record Number: [id number] Patient Account Number: [id number] Date of Birth/Sex: Treating RN: 02-14-1956 (65 y.o. [name] Primary Care Jossie Smoot: [name] Other Clinician: Referring Abishai Viegas: Treating Elijah Michaelis/Extender: [name] in Treatment: 81 Wound Status Wound Number: 77 Primary Lymphedema Etiology: Wound Location: Left, Proximal, Lateral Lower Leg Wound Open Wounding Event: Gradually Appeared Status: Date Acquired: 11/16/2020 Comorbid Lymphedema, Sleep Apnea, Congestive Heart Failure, Weeks Of Treatment: 30 History: Hypertension, Peripheral Venous Disease, Osteoarthritis Clustered Wound: No Wound Measurements Length: (cm) 0.3 Width: (cm) 0.3 Depth: (cm) 0.1 Area: (cm) 0.071 Volume: (cm) 0.007 % Reduction in Area: 98% % Reduction in Volume: 98% Epithelialization: Large (67-100%) Tunneling: No Undermining: No Wound Description Classification: Full Thickness Without Exposed Support Structures Wound Margin: Distinct, outline attached Exudate Amount: Medium Exudate Type: Serosanguineous Exudate Color: red, brown Foul Odor After Cleansing: No Slough/Fibrino No Wound Bed Granulation Amount: Large (67-100%) Exposed Structure Granulation Quality: Red Fascia Exposed: No Necrotic Amount: None Present (0%) Fat Layer (Subcutaneous Tissue) Exposed: Yes Tendon Exposed: No Muscle Exposed: No Joint Exposed: No Bone Exposed: No Treatment Notes Wound #77 (Lower Leg) Wound Laterality: Left, Lateral,  Proximal Cleanser Soap and Water Discharge Instruction: May shower and wash wound with dial antibacterial soap and water prior to dressing change. Normal Saline Discharge Instruction: Cleanse the wound with Normal Saline prior to applying a clean dressing using gauze sponges, not tissue or cotton balls. Peri-Wound Care Triamcinolone 15 (g) Discharge Instruction: Use triamcinolone 15 (g) as directed Sween Lotion (Moisturizing lotion) Discharge Instruction: Apply moisturizing lotion as directed Topical Primary Dressing KerraCel Ag Gelling Fiber Dressing, 4x5 in (silver alginate) Discharge Instruction: Apply silver alginate to wound bed as instructed Secondary Dressing Woven Gauze Sponge, Non-Sterile 4x4 in Discharge Instruction: Apply over primary dressing as directed. ABD Pad, 5x9 Discharge Instruction: Apply over primary dressing as directed. Secured With Compression Wrap FourPress (4 layer compression wrap) Discharge Instruction: Apply four layer compression as directed. Compression Stockings Add-Ons Electronic Signature(s) Signed: 06/14/2021 4:47:08 PM By: [name] Entered By: [name] on 06/14/2021 13:04:05 -------------------------------------------------------------------------------- Wound Assessment Details Patient Name: Date of Service: [name] [name], [name] 06/14/2021 12:30 PM Medical Record Number: [id number] Patient Account Number: [id number] Date of Birth/Sex: Treating RN: 10/11/1955 (65 y.o. [name] Primary Care Kyera Felan: [name] Other Clinician: Referring Clova Morlock: Treating Toneisha Savary/Extender: [name] in Treatment: 81 Wound Status Wound Number: [id number] Primary Lymphedema Etiology: Wound Location: Right, Posterior Lower Leg Wound Healed - Epithelialized Wounding Event: Gradually Appeared Status: Date Acquired: 11/16/2020 Comorbid Lymphedema, Sleep Apnea, Congestive Heart Failure, Weeks Of Treatment: 30 History:  Hypertension, Peripheral Venous Disease, Osteoarthritis Clustered Wound: No Photos Wound Measurements Length: (cm) Width: (cm) Depth: (cm) Area: (cm) Volume: (cm) 0 % Reduction in Area: 100% 0 % Reduction in Volume: 100% 0 0 0 Wound Description Classification: Full Thickness Without Exposed Support Structur es Electronic Signature(s) Signed: 06/14/2021 4:47:08 PM By: [name] Entered By: [name] on 06/14/2021 13:13:18 -------------------------------------------------------------------------------- Vitals Details Patient Name: Date of Service: [name] [name], [name] 06/14/2021 12:30 PM Medical Record Number: [id number] Patient Account Number: [id number] Date of Birth/Sex: Treating RN: 03-13-1956 (65 y.o. [name] Primary Care [place] Cantero: [name] Other Clinician: Referring Zhania Shaheen: Treating Xzavion Doswell/Extender: [name] in Treatment: 81 Vital Signs Time Taken: 12:44 Temperature (F): 97.8 Height (in): 70 Pulse (bpm): 76 Weight (lbs): 360 Respiratory Rate (breaths/min): 22 Body Mass Index (BMI): 51.6 Blood Pressure (mmHg): 158/82 Reference  Range: 80 - 120 mg / dl Electronic Signature(s) Signed: 06/14/2021 4:47:08 PM By: [name] Entered By: [name] on 06/14/2021 13:18:26

## 2021-06-14 NOTE — Progress Notes (Signed)
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 medial ankle. 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 Not surprisingly comes back in with wounds on his bilateral legs at the level of where his external  compression garment was tight superiorly. He tells me he is using his pumps once every second day. 7/20; 2-week follow-up the patient has not been using his compression pumps his wife was transferred to a new nursing home. He has 1 wound on the medial left lower leg and a small area on the right lateral 8/17; patient arrives today with only a smattering of small wounds on the left anterior lower leg most of these are eschared and look benign. There is nothing on his right leg. We have been using silver alginate under 4-layer compression 8/31; patient's wounds on the left anterior lower leg are larger. There is still nothing open on the right toe although we did put compression wraps on this last time. He has home health. Says he is used his compression pumps twice in the last 3 days. Obviously not sufficient 9/14; 2-week follow-up. We discharged him in his own zippered stocking. Apparently home health helped him change this and noted weeping edema fluid therefore put him back in a  wrap he arrives in the clinic today with no open wounds on the right innumerable small broken areas on the anterior left calf. Uncontrolled edema above the level of the wrap compatible with lymphedema 9/27 2-week follow-up. He arrives today with the left anterior lower extremity looking better. On the right he has a small open area posteriorly. I am going to put him back in compression on both sides. He claims compliance with his compression pumps at home twice a day he has home health changing his dressing 10/26; 1 month follow-up he has home health changing his dressings there is no open wound on the right leg he has extremitease stockings and external compression pumps which he says he is using once a day. The left leg is always been a more difficult site and although it is difficult to identify any precise open wound he has several leaking areas especially anteriorly and superiorly and at the edge of his wraps 11/9; 2-week follow-up. He has his extremities stocking on the right. He says he has been using his compression pumps once a day. He has 1 remaining wound on the left anterior mid tibia which looks healthy his edema control is otherwise good on the left 11/30; there is now a 3-week follow-up. We use his own compression stocking on the right last time as he has no open wounds. He apparently developed a UTI received a course of ciprofloxacin. He did not wear his compression pumps and I wonder whether he actually was using his stocking. Home health put a compression wrap on his right leg last week. He has multiple small open areas on the left anterior leg this week. 12/7; still a cluster of small areas on the left lateral calf and the right lateral calf. He has home health changing his dressings. For some reason he will not use compression pumps reliably this week because his wife spent 2 days in the hospital with anemia 12/21; he continues to have a cluster of small areas predominantly on the  left lateral but also the left anterior lower leg. More defined wounds on the right anterior and right medial. The he says he is using the compression pumps every other day. I have never understood the issue for this noncompliant. He still has home health coming out 1/11 the patient continues to have multiple small open areas on the left lateral left anterior and medial lower leg anteriorly. He also has a small punched-out painful area in  the crease of his left ankle which I think is probably a rapid injury. Very tender. I debrided in this area very adherent debris. Also small areas on the right lateral calf. We have been using silver alginate. His compression pump usage is probably marginal 2/8; the patient comes back in with his legs essentially in the same condition. He has multiple small areas on his bilateral lower extremities left greater than right anteriorly and posteriorly. A lot of these have small eschars on top some of them do not. His edema control is marginal. He says he uses his pumps once a day. He has home health changing his dressing 4-layer compression silver alginate as the primary dressing 3/1; Marked deterioration this visit. He has multiple small areas across the entirety of the lateral anterior and medial left lower leg. I am actually concerned that he may be on his way to a more substantive degree of breakdown in this area. He is using his compression pumps 4 times per week. He has home health changing this dressing week 3/29; again not much improvement from last time he is here significant bilateral small wounds for the most part skin breakdown. The larger areas are on the left lateral. Small areas bilaterally. He has poor edema control. Relates that he is not using his compression pumps with any reliability. He states his wife was in the hospital last week. He does not complain of chest pain or shortness of breath 4/26; patient presents for 1 month follow-up. He reports no  issues. He states that he last used his lymphedema pumps several weeks ago. He has been having his legs wrapped bilaterally with home health with 4-layer compression and silver alginate underneath. He has no complaints today. 5/31; patient presents for 1 month follow-up. He tolerates the compression wraps well. He states that home health continues to come out twice weekly T o change theses. He is unable to use his lymphedema pumps due to the fact that he has to take his shoes off. He states he cannot put his shoes on by himself. He denies any issues today. He denies signs of infection. 6/28; patient presents for 1 month follow-up. He has been tolerating compression wraps well. He states he developed a wound to the anterior part of his ankle due to the positioning of the padding placed by the home health nurse rubbing against the area. He states he noticed this getting worse in the last 2 weeks but this has since been corrected. He reports increased soreness to the specific area. Overall he is doing well 8/23; the patient arrives have not been seen here in almost 2 months. He still lives in [place]. He has [facility] home health twice a week that is the only dressing changes he gets. He reports that he wears these surgical sandals but does not feel he can get them on and off enough to use the pumps on a daily basis in fact he is only using them twice a week the rest of the time he leaves this very swollen left greater than right foot in the shoes. He arrives in clinic today with a cluster of small wounds all over the left leg. Very dry chronically inflamed skin in his left lower leg. He is actually better on the right although there are open areas laterally and posteriorly 9/27; the patient comes in for his monthly visit with 2 small open areas on the left and 1 on the right. His edema control is excellent bilaterally and  actually the condition of his skin is quite a bit better 2. He says he occasionally  uses his compression pumps, he is certainly not been compliant in this area . He does say that he has been using more of his Lasix.. This is his closest I am seeing this man to healed in quite a long time. We are still using silver alginate on anything that is open under 4-layer compression bilaterally. We have also been using TCA and Cetaphil to the skin liberally Electronic Signature(s) Signed: 06/14/2021 4:21:38 PM By: Linton Ham MD Entered By: Linton Ham on 06/14/2021 13:38:36 -------------------------------------------------------------------------------- Physical Exam Details Patient Name: Date of Service: [name] [name], [name] RDIN W. 06/14/2021 12:30 PM Medical Record Number: [id number] Patient Account Number: [id number] Date of Birth/Sex: Treating RN: 02-10-1956 (65 y.o. [name] Primary Care Provider: [name] Other Clinician: Referring Provider: Treating Provider/Extender: [name] in Treatment: 53 Constitutional Patient is hypertensive.. Pulse regular and within target range for patient.Marland Kitchen Respirations regular, non-labored and within target range.. Temperature is normal and within the target range for the patient.Marland Kitchen Appears in no distress. Cardiovascular Pedal pulses are palpable. Notes Wound examination; considerable improvement in both legs especially the left. Small area on the right 2 small areas on the left his skin looks a lot better. Edema control is better than I have seen this for a long time except for his feet. Electronic Signature(s) Signed: 06/14/2021 4:21:38 PM By: Linton Ham MD Entered By: Linton Ham on 06/14/2021 13:39:27 -------------------------------------------------------------------------------- Physician Orders Details Patient Name: Date of Service: [name] [name], [name] RDIN W. 06/14/2021 12:30 PM Medical Record Number: [id number] Patient Account Number: [id number] Date of Birth/Sex: Treating RN: March 06, 1956 (65  y.o. [name] Primary Care Provider: [name] Other Clinician: Referring Provider: Treating Provider/Extender: [name] in Treatment: 60 Verbal / Phone Orders: No Diagnosis Coding ICD-10 Coding Code Description [phone] Chronic venous hypertension (idiopathic) with ulcer and inflammation of bilateral lower extremity I89.0 Lymphedema, not elsewhere classified L97.821 Non-pressure chronic ulcer of other part of left lower leg limited to breakdown of skin L97.811 Non-pressure chronic ulcer of other part of right lower leg limited to breakdown of skin Follow-up Appointments ppointment in 2 weeks. - with Dr. Dellia Nims Return A Bathing/ Shower/ Hygiene May shower with protection but do not get wound dressing(s) wet. Edema Control - Lymphedema / SCD / Other Lymphedema Pumps. Use Lymphedema pumps on leg(s) 2-3 times a day for 45-60 minutes. If wearing any wraps or hose, do not remove them. Continue exercising as instructed. Elevate legs to the level of the heart or above for 30 minutes daily and/or when sitting, a frequency of: Avoid standing for long periods of time. Exercise regularly Other Edema Control Orders/Instructions: - Bring compression stockings to next visit Home Health No change in wound care orders this week; continue Home Health for wound care. May utilize formulary equivalent dressing for wound treatment orders unless otherwise specified. Other Home Health Orders/Instructions: - [facility] 2 x a week Wound Treatment Wound #65 - Lower Leg Wound Laterality: Left, Anterior, Distal Cleanser: Soap and Water (Home Health) 2 x Per Week/30 Days Discharge Instructions: May shower and wash wound with dial antibacterial soap and water prior to dressing change. Cleanser: Normal Saline (Home Health) (Generic) 2 x Per Week/30 Days Discharge Instructions: Cleanse the wound with Normal Saline prior to applying a clean dressing using gauze sponges, not  tissue or cotton balls. Peri-Wound Care: Triamcinolone 15 (g) 2 x Per Week/30 Days  Discharge Instructions: Use triamcinolone 15 (g) as directed Peri-Wound Care: Sween Lotion (Moisturizing lotion) (Home Health) (Generic) 2 x Per Week/30 Days Discharge Instructions: Apply moisturizing lotion as directed Prim Dressing: KerraCel Ag Gelling Fiber Dressing, 4x5 in (silver alginate) (Home Health) (Generic) 2 x Per Week/30 Days ary Discharge Instructions: Apply silver alginate to wound bed as instructed Secondary Dressing: Woven Gauze Sponge, Non-Sterile 4x4 in (Home Health) (Generic) 2 x Per Week/30 Days Discharge Instructions: Apply over primary dressing as directed. Secondary Dressing: ABD Pad, 5x9 (Home Health) 2 x Per Week/30 Days Discharge Instructions: Apply over primary dressing as directed. Compression Wrap: FourPress (4 layer compression wrap) (Home Health) (Generic) 2 x Per Week/30 Days Discharge Instructions: Apply four layer compression as directed. Wound #68 - Lower Leg Wound Laterality: Left, Anterior, Proximal Cleanser: Soap and Water (Home Health) 2 x Per Week/30 Days Discharge Instructions: May shower and wash wound with dial antibacterial soap and water prior to dressing change. Cleanser: Normal Saline (Home Health) (Generic) 2 x Per Week/30 Days Discharge Instructions: Cleanse the wound with Normal Saline prior to applying a clean dressing using gauze sponges, not tissue or cotton balls. Peri-Wound Care: Triamcinolone 15 (g) 2 x Per Week/30 Days Discharge Instructions: Use triamcinolone 15 (g) as directed Peri-Wound Care: Sween Lotion (Moisturizing lotion) (Home Health) (Generic) 2 x Per Week/30 Days Discharge Instructions: Apply moisturizing lotion as directed Prim Dressing: KerraCel Ag Gelling Fiber Dressing, 4x5 in (silver alginate) (Home Health) (Generic) 2 x Per Week/30 Days ary Discharge Instructions: Apply silver alginate to wound bed as instructed Secondary Dressing:  Woven Gauze Sponge, Non-Sterile 4x4 in (Home Health) (Generic) 2 x Per Week/30 Days Discharge Instructions: Apply over primary dressing as directed. Secondary Dressing: ABD Pad, 5x9 (Home Health) 2 x Per Week/30 Days Discharge Instructions: Apply over primary dressing as directed. Compression Wrap: FourPress (4 layer compression wrap) (Home Health) (Generic) 2 x Per Week/30 Days Discharge Instructions: Apply four layer compression as directed. Wound #73 - Lower Leg Wound Laterality: Right, Lateral Cleanser: Soap and Water (Home Health) 2 x Per Week/30 Days Discharge Instructions: May shower and wash wound with dial antibacterial soap and water prior to dressing change. Cleanser: Normal Saline (Home Health) (Generic) 2 x Per Week/30 Days Discharge Instructions: Cleanse the wound with Normal Saline prior to applying a clean dressing using gauze sponges, not tissue or cotton balls. Peri-Wound Care: Triamcinolone 15 (g) 2 x Per Week/30 Days Discharge Instructions: Use triamcinolone 15 (g) as directed Peri-Wound Care: Sween Lotion (Moisturizing lotion) (Home Health) (Generic) 2 x Per Week/30 Days Discharge Instructions: Apply moisturizing lotion as directed Prim Dressing: KerraCel Ag Gelling Fiber Dressing, 4x5 in (silver alginate) (Home Health) (Generic) 2 x Per Week/30 Days ary Discharge Instructions: Apply silver alginate to wound bed as instructed Secondary Dressing: Woven Gauze Sponge, Non-Sterile 4x4 in (Home Health) (Generic) 2 x Per Week/30 Days Discharge Instructions: Apply over primary dressing as directed. Secondary Dressing: ABD Pad, 5x9 (Home Health) 2 x Per Week/30 Days Discharge Instructions: Apply over primary dressing as directed. Compression Wrap: FourPress (4 layer compression wrap) (Home Health) (Generic) 2 x Per Week/30 Days Discharge Instructions: Apply four layer compression as directed. Wound #77 - Lower Leg Wound Laterality: Left, Lateral, Proximal Cleanser: Soap and  Water (Home Health) 2 x Per Week/30 Days Discharge Instructions: May shower and wash wound with dial antibacterial soap and water prior to dressing change. Cleanser: Normal Saline (Home Health) (Generic) 2 x Per Week/30 Days Discharge Instructions: Cleanse the wound with Normal Saline prior to  applying a clean dressing using gauze sponges, not tissue or cotton balls. Peri-Wound Care: Triamcinolone 15 (g) 2 x Per Week/30 Days Discharge Instructions: Use triamcinolone 15 (g) as directed Peri-Wound Care: Sween Lotion (Moisturizing lotion) (Home Health) (Generic) 2 x Per Week/30 Days Discharge Instructions: Apply moisturizing lotion as directed Prim Dressing: KerraCel Ag Gelling Fiber Dressing, 4x5 in (silver alginate) (Home Health) (Generic) 2 x Per Week/30 Days ary Discharge Instructions: Apply silver alginate to wound bed as instructed Secondary Dressing: Woven Gauze Sponge, Non-Sterile 4x4 in (Home Health) (Generic) 2 x Per Week/30 Days Discharge Instructions: Apply over primary dressing as directed. Secondary Dressing: ABD Pad, 5x9 (Home Health) 2 x Per Week/30 Days Discharge Instructions: Apply over primary dressing as directed. Compression Wrap: FourPress (4 layer compression wrap) (Home Health) (Generic) 2 x Per Week/30 Days Discharge Instructions: Apply four layer compression as directed. Electronic Signature(s) Signed: 06/14/2021 4:21:38 PM By: Linton Ham MD Signed: 06/14/2021 4:47:08 PM By: [name] Entered By: [name] on 06/14/2021 13:23:53 -------------------------------------------------------------------------------- Problem List Details Patient Name: Date of Service: [name] [name], [name] RDIN W. 06/14/2021 12:30 PM Medical Record Number: [id number] Patient Account Number: [id number] Date of Birth/Sex: Treating RN: [birth year] (64 y.o. [name] Primary Care Provider: [name] Other Clinician: Referring Provider: Treating Provider/Extender: [name] in Treatment: 36 Active Problems ICD-10 Encounter Code Description Active Date MDM Diagnosis I87.333 Chronic venous hypertension (idiopathic) with ulcer and inflammation of 11/25/2019 No Yes bilateral lower extremity I89.0 Lymphedema, not elsewhere classified 11/25/2019 No Yes L97.821 Non-pressure chronic ulcer of other part of left lower leg limited to breakdown 11/25/2019 No Yes of skin L97.811 Non-pressure chronic ulcer of other part of right lower leg limited to breakdown 11/25/2019 No Yes of skin Inactive Problems ICD-10 Code Description Active Date Inactive Date L03.115 Cellulitis of right lower limb 03/09/2020 03/09/2020 Resolved Problems Electronic Signature(s) Signed: 06/14/2021 4:21:38 PM By: Linton Ham MD Entered By: Linton Ham on 06/14/2021 13:36:25 -------------------------------------------------------------------------------- Progress Note Details Patient Name: Date of Service: [name] [name], [name] RDIN W. 06/14/2021 12:30 PM Medical Record Number: [id number] Patient Account Number: [id number] Date of Birth/Sex: Treating RN: 07-22-1956 (65 y.o. [name] Primary Care Provider: [name] Other Clinician: Referring Provider: Treating Provider/Extender: [name] in Treatment: 81 Subjective History of Present Illness (HPI) The following HPI elements were documented for the patient's wound: Location: both legs Quality: Patient reports No Pain. long history of chronic venous hypertension,chf,morbid obesity. s/p gsv ablation. cva 2oo4. sleep apnea andhbp. breakdown of skin both legs around 2 months ago. treated here for this in 2015. no .dm. 05/09/2016 -- he had his arterial studies done last week and his right ABI was 1.3 and his left ABI was 1.4. His right toe brachial index was 0.98 and on the left was 0.9. Venous studies have only be done today and reports are awaited. 05/16/2016 -- had a lower extremity  venous duplex reflux evaluation which showed venous incompetence noted in the left great saphenous and common femoral veins and a vascular surgery consult was recommended by Dr. Donnetta Hutching. He had a arterial study done which showed a right ABI of 1.3 which is within normal limits at rest and a left ABI of 1.4 which is within normal limits at rest and may be falsely elevated. The right toe brachial index was 0.98 and the left toe brachial index was 0.90 05/30/2016 -- seen by Dr. Althea Charon -- is known to have a prior laser ablation of his left  great saphenous vein in 2009. Prior to that he had 2 ablations of the odd same vein by interventional radiology. As noted to have severe venous hypertension bilaterally. 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 His right great saphenous vein is somewhat dilated but no evidence of reflux. The only deep venous reflux demonstrated was in his left common femoral vein. After every consideration Dr. Donnetta Hutching recommended reattempt at ablation versus removal of his left great saphenous vein in the operating room with the standard vein stripping technique. The patient would consider this and let him know again. 06/20/16 patient continues to wear a juxtalite on the right leg without any open areas. On the lateral aspect of his left leg he has 4 wounds and a small area on the medial area of the left leg. Using silver alginate under Profore 06/27/16 still no open areas on the right leg. On the lateral aspect of his left leg he has 4 wounds which continue to have a nonviable surface and wheeze been using silver alginate under Profore 07/04/2016 -- patient hasn't yet to contact his vascular surgeon regarding plans for surgical intervention and I believe he is trying his best to avoid surgery. I have again discussed with him the futility of trying to heal this and keep it healed, if he does not agree to surgical  intervention 07/11/2016 -- the patient has not had any juxta light ordered for at least 3 years and we will order him a pair. 08/08/2016 -- he has been approved for Apligraf and they will get this ready for him next week 08/15/2016 -- he has his first Apligraf applied today 08/29/2016 -- he has had his second application of Apligraf today 09/12/2016 -- his Apligraf has not arrived today due to the holiday 09/19/2016 -- he has had his third application of Apligraf today 10/03/2016 -- he has had his fourth application of Apligraf today. 10/18/2016 -- his next Apligraf has not arrived today. He has had chronic problems with his back and was to start on steroids and I have told him there are no objections against this. He is also taking appropriate medications as per his orthopedic doctor. 10/24/2016 -- he is here for his fifth application of Apligraf today. 01/09/2017 -- is been having repeated falls and problems with his back and saw a spine surgeon who has recommended holding his anticoagulation and will have some epidural injections in a few weeks. 03/13/2017 - he had been doing very well with his left lower extremity and the ulcerations that come down significantly. However last week he may have hit himself against a metal cabinet and has started having abrasions and because of this has started weeping from the right lateral calf. He has not used his compression since morning and his right lower extremity has markedly increased and lymphedema 03/27/17; the patient appears to be doing very well only a small cluster of wounds on the right lateral lower extremity. Most of the areas on his left anterior and left posterior leg are closed the wrong way to closing. His compression slipped down today there is irritation where the wrap edge was but no evidence of infection 04/24/2017 -- the patient has been using his lymphedema pumps and is also wearing his new compression on the right lower  extremity. 05/01/2017 -- he has begun using his lymphedema pumps for a longer period of time but unfortunately had a fall and may have bruised his left lateral lower  extremity under the 4-layer compression wrap and has multiple ulcerations in this area today 06/05/2017 -- after examination today he is noted to have taken a significant turn for the worse with multiple open ulcerations on his left lower calf and anterior leg. Lymphedema is better controlled and there is no evidence of cellulitis. I believe the patient is not being compliant with his lymphedema pumps. 06/12/2017 -- the patient did not come for his nurse visit change to the left lower extremity on Friday, as advised. He has not been doing his compression appropriately and now has developed a ulcerated area on the right lower extremity. He also has not been using his lymphedema pumps appropriately. in addition to this the patient tells me that he and his wife are going to the beach this coming Sunday for over a week 06/26/2017 -- the patient is back after 2 weeks when he had gone on vacation and his treatment was substandard and he did not do his lymphedema pump. He does not have any systemic symptoms 08/07/2017 -- he kept his compression stockings all week and says he has been using his compression wraps on the right lower leg. He is also saying he is diligent with his lymphedema pumps. 08/21/17; using his lymphedema pumps about twice a week. He keeps his compression wraps on the left lower leg. He has his compression stocking on the right leg 12/14/18patient continues to be noncompliant with the lymphedema pumps.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 leaves this very swollen left greater than right foot in the shoes. He arrives in clinic today with a cluster of small wounds all over the left leg. Very dry chronically inflamed skin in his left lower leg. He is actually better on the right although there are open areas laterally and posteriorly 9/27; the patient comes in for his monthly visit with 2 small open areas on the left and 1 on the right. His edema control is excellent bilaterally and actually the condition of his skin is quite a bit better 2. He says he occasionally uses his compression pumps, he is certainly not been compliant in this area . He does say that he has been using more of his Lasix.. This is his closest I am seeing this man to healed in quite a long time. We are still using silver alginate on anything that is open under 4-layer compression bilaterally. We have also been using TCA and Cetaphil to the skin liberally Objective Constitutional Patient is hypertensive.. Pulse regular and within target range for patient.Marland Kitchen Respirations regular, non-labored and within target range.. Temperature is normal and within the target range for the patient.Marland Kitchen Appears in no distress. Vitals Time Taken: 12:44 PM, Height: 70 in, Weight: 360 lbs, BMI: 51.6, Temperature: 97.8 F, Pulse: 76 bpm, Respiratory Rate: 22 breaths/min, Blood Pressure: 158/82 mmHg. Cardiovascular Pedal pulses are palpable. General Notes: Wound examination; considerable improvement in both legs especially the left. Small area on the right 2 small areas on the left his skin looks a lot  better. Edema control is better than I have seen this for a long time except for his feet. Integumentary (Hair, Skin) Wound #65 status is Open. Original cause of wound was Gradually Appeared. The date acquired was: 09/08/2019. The wound has been in treatment 81 weeks. The wound is located on the [facility] Lower Leg. The wound measures 0.2cm length x 0.3cm width x 0.1cm depth; 0.047cm^2 area and 0.005cm^3 volume. There is Fat Layer (Subcutaneous Tissue) exposed. There is  no tunneling or undermining noted. There is a medium amount of purulent drainage noted. The wound margin is distinct with the outline attached to the wound base. There is large (67-100%) pink granulation within the wound bed. There is no necrotic tissue within the wound bed. Wound #68 status is Open. Original cause of wound was Gradually Appeared. The date acquired was: 03/23/2020. The wound has been in treatment 64 weeks. The wound is located on the Left,Proximal,Anterior Lower Leg. The wound measures 0.4cm length x 0.5cm width x 0.1cm depth; 0.157cm^2 area and 0.016cm^3 volume. There is Fat Layer (Subcutaneous Tissue) exposed. There is no tunneling or undermining noted. There is a small amount of serosanguineous drainage noted. The wound margin is distinct with the outline attached to the wound base. There is large (67-100%) red granulation within the wound bed. There is no necrotic tissue within the wound bed. Wound #73 status is Open. Original cause of wound was Gradually Appeared. The date acquired was: 08/03/2020. The wound has been in treatment 43 weeks. The wound is located on the Right,Lateral Lower Leg. The wound measures 0.6cm length x 0.4cm width x 0.1cm depth; 0.188cm^2 area and 0.019cm^3 volume. There is Fat Layer (Subcutaneous Tissue) exposed. There is no tunneling or undermining noted. There is a medium amount of serosanguineous drainage noted. The wound margin is distinct with the outline attached to the wound  base. There is large (67-100%) red granulation within the wound bed. There is no necrotic tissue within the wound bed. Wound #77 status is Open. Original cause of wound was Gradually Appeared. The date acquired was: 11/16/2020. The wound has been in treatment 30 weeks. The wound is located on the Left,Proximal,Lateral Lower Leg. The wound measures 0.3cm length x 0.3cm width x 0.1cm depth; 0.071cm^2 area and 0.007cm^3 volume. There is Fat Layer (Subcutaneous Tissue) exposed. There is no tunneling or undermining noted. There is a medium amount of serosanguineous drainage noted. The wound margin is distinct with the outline attached to the wound base. There is large (67-100%) red granulation within the wound bed. There is no necrotic tissue within the wound bed. Wound #78 status is Healed - Epithelialized. Original cause of wound was Gradually Appeared. The date acquired was: 11/16/2020. The wound has been in treatment 30 weeks. The wound is located on the Right,Posterior Lower Leg. The wound measures 0cm length x 0cm width x 0cm depth; 0cm^2 area and 0cm^3 volume. 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 There was a Four Layer Compression Therapy Procedure by [name], RN. Post procedure Diagnosis Wound #65: Same as Pre-Procedure Wound #68 Pre-procedure diagnosis of Wound #68 is a Lymphedema located on the Left,Proximal,Anterior Lower Leg . There was a Four Layer Compression Therapy Procedure by [name], RN. Post procedure Diagnosis Wound #68: Same as  Pre-Procedure Wound #73 Pre-procedure diagnosis of Wound #73 is a Lymphedema located on the Right,Lateral Lower Leg . There was a Four Layer Compression Therapy Procedure by [name], RN. Post procedure Diagnosis Wound #73: Same as Pre-Procedure Wound #77 Pre-procedure diagnosis of Wound #77 is a Lymphedema located on the Left,Proximal,Lateral Lower Leg . There was a Four  Layer Compression Therapy Procedure by [name], RN. Post procedure Diagnosis Wound #77: Same as Pre-Procedure Plan Follow-up Appointments: Return Appointment in 2 weeks. - with Dr. Arcola Jansky Shower/ Hygiene: May shower with protection but do not get wound dressing(s) wet. Edema Control - Lymphedema / SCD / Other: Lymphedema Pumps. Use Lymphedema pumps on leg(s) 2-3 times a day for 45-60 minutes. If wearing any wraps or hose, do not remove them. Continue exercising as instructed. Elevate legs to the level of the heart or above for 30 minutes daily and/or when sitting, a frequency of: Avoid standing for long periods of time. Exercise regularly Other Edema Control Orders/Instructions: - Bring compression stockings to next visit Home Health: No change in wound care orders this week; continue Home Health for wound care. May utilize formulary equivalent dressing for wound treatment orders unless otherwise specified. Other Home Health Orders/Instructions: - [facility] 2 x a week WOUND #65: - Lower Leg Wound Laterality: Left, Anterior, Distal Cleanser: Soap and Water (Home Health) 2 x Per Week/30 Days Discharge Instructions: May shower and wash wound with dial antibacterial soap and water prior to dressing change. Cleanser: Normal Saline (Home Health) (Generic) 2 x Per Week/30 Days Discharge Instructions: Cleanse the wound with Normal Saline prior to applying a clean dressing using gauze sponges, not tissue or cotton balls. Peri-Wound Care: Triamcinolone 15 (g) 2 x Per Week/30 Days Discharge Instructions: Use  triamcinolone 15 (g) as directed Peri-Wound Care: Sween Lotion (Moisturizing lotion) (Home Health) (Generic) 2 x Per Week/30 Days Discharge Instructions: Apply moisturizing lotion as directed Prim Dressing: KerraCel Ag Gelling Fiber Dressing, 4x5 in (silver alginate) (Home Health) (Generic) 2 x Per Week/30 Days ary Discharge Instructions: Apply silver alginate to wound bed as instructed Secondary Dressing: Woven Gauze Sponge, Non-Sterile 4x4 in (Home Health) (Generic) 2 x Per Week/30 Days Discharge Instructions: Apply over primary dressing as directed. Secondary Dressing: ABD Pad, 5x9 (Home Health) 2 x Per Week/30 Days Discharge Instructions: Apply over primary dressing as directed. Com pression Wrap: FourPress (4 layer compression wrap) (Home Health) (Generic) 2 x Per Week/30 Days Discharge Instructions: Apply four layer compression as directed. WOUND #68: - Lower Leg Wound Laterality: Left, Anterior, Proximal Cleanser: Soap and Water (Home Health) 2 x Per Week/30 Days Discharge Instructions: May shower and wash wound with dial antibacterial soap and water prior to dressing change. Cleanser: Normal Saline (Home Health) (Generic) 2 x Per Week/30 Days Discharge Instructions: Cleanse the wound with Normal Saline prior to applying a clean dressing using gauze sponges, not tissue or cotton balls. Peri-Wound Care: Triamcinolone 15 (g) 2 x Per Week/30 Days Discharge Instructions: Use triamcinolone 15 (g) as directed Peri-Wound Care: Sween Lotion (Moisturizing lotion) (Home Health) (Generic) 2 x Per Week/30 Days Discharge Instructions: Apply moisturizing lotion as directed Prim Dressing: KerraCel Ag Gelling Fiber Dressing, 4x5 in (silver alginate) (Home Health) (Generic) 2 x Per Week/30 Days ary Discharge Instructions: Apply silver alginate to wound bed as instructed Secondary Dressing: Woven Gauze Sponge, Non-Sterile 4x4 in (Home Health) (Generic) 2 x Per Week/30 Days Discharge Instructions: Apply  over primary dressing as directed. Secondary Dressing: ABD Pad, 5x9 (Home Health) 2 x Per Week/30 Days Discharge Instructions: Apply over primary dressing as directed. Com pression Wrap: FourPress (4 layer compression wrap) (Home Health) (Generic) 2 x Per Week/30 Days Discharge Instructions: Apply four layer compression as directed. WOUND #73: - Lower Leg Wound Laterality: Right, Lateral Cleanser: Soap and Water (Home Health) 2 x Per Week/30 Days Discharge Instructions: May shower and wash  wound with dial antibacterial soap and water prior to dressing change. Cleanser: Normal Saline (Home Health) (Generic) 2 x Per Week/30 Days Discharge Instructions: Cleanse the wound with Normal Saline prior to applying a clean dressing using gauze sponges, not tissue or cotton balls. Peri-Wound Care: Triamcinolone 15 (g) 2 x Per Week/30 Days Discharge Instructions: Use triamcinolone 15 (g) as directed Peri-Wound Care: Sween Lotion (Moisturizing lotion) (Home Health) (Generic) 2 x Per Week/30 Days Discharge Instructions: Apply moisturizing lotion as directed Prim Dressing: KerraCel Ag Gelling Fiber Dressing, 4x5 in (silver alginate) (Home Health) (Generic) 2 x Per Week/30 Days ary Discharge Instructions: Apply silver alginate to wound bed as instructed Secondary Dressing: Woven Gauze Sponge, Non-Sterile 4x4 in (Home Health) (Generic) 2 x Per Week/30 Days Discharge Instructions: Apply over primary dressing as directed. Secondary Dressing: ABD Pad, 5x9 (Home Health) 2 x Per Week/30 Days Discharge Instructions: Apply over primary dressing as directed. Com pression Wrap: FourPress (4 layer compression wrap) (Home Health) (Generic) 2 x Per Week/30 Days Discharge Instructions: Apply four layer compression as directed. WOUND #77: - Lower Leg Wound Laterality: Left, Lateral, Proximal Cleanser: Soap and Water (Home Health) 2 x Per Week/30 Days Discharge Instructions: May shower and wash wound with dial antibacterial  soap and water prior to dressing change. Cleanser: Normal Saline (Home Health) (Generic) 2 x Per Week/30 Days Discharge Instructions: Cleanse the wound with Normal Saline prior to applying a clean dressing using gauze sponges, not tissue or cotton balls. Peri-Wound Care: Triamcinolone 15 (g) 2 x Per Week/30 Days Discharge Instructions: Use triamcinolone 15 (g) as directed Peri-Wound Care: Sween Lotion (Moisturizing lotion) (Home Health) (Generic) 2 x Per Week/30 Days Discharge Instructions: Apply moisturizing lotion as directed Prim Dressing: KerraCel Ag Gelling Fiber Dressing, 4x5 in (silver alginate) (Home Health) (Generic) 2 x Per Week/30 Days ary Discharge Instructions: Apply silver alginate to wound bed as instructed Secondary Dressing: Woven Gauze Sponge, Non-Sterile 4x4 in (Home Health) (Generic) 2 x Per Week/30 Days Discharge Instructions: Apply over primary dressing as directed. Secondary Dressing: ABD Pad, 5x9 (Home Health) 2 x Per Week/30 Days Discharge Instructions: Apply over primary dressing as directed. Com pression Wrap: FourPress (4 layer compression wrap) (Home Health) (Generic) 2 x Per Week/30 Days Discharge Instructions: Apply four layer compression as directed. 1. We continue with silver alginate under 4-layer compression 2. I have asked him to bring his juxta lite stockings in 2 weeks 3. He does not use his compression pumps reliably however he will need to when we transition him into any form of stockings 4. I was surprised how good this looked today Electronic Signature(s) Signed: 06/14/2021 4:21:38 PM By: Linton Ham MD Entered By: Linton Ham on 06/14/2021 13:40:51 -------------------------------------------------------------------------------- SuperBill Details Patient Name: Date of Service: [name] [name], [name] RDIN W. 06/14/2021 Medical Record Number: [id number] Patient Account Number: [id number] Date of Birth/Sex: Treating RN: February 25, 1956 (65 y.o. 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

## 2021-06-18 DIAGNOSIS — I5032 Chronic diastolic (congestive) heart failure: Secondary | ICD-10-CM | POA: Diagnosis not present

## 2021-06-18 DIAGNOSIS — G4733 Obstructive sleep apnea (adult) (pediatric): Secondary | ICD-10-CM | POA: Diagnosis not present

## 2021-06-18 DIAGNOSIS — I110 Hypertensive heart disease with heart failure: Secondary | ICD-10-CM | POA: Diagnosis not present

## 2021-06-18 DIAGNOSIS — L97811 Non-pressure chronic ulcer of other part of right lower leg limited to breakdown of skin: Secondary | ICD-10-CM | POA: Diagnosis not present

## 2021-06-18 DIAGNOSIS — I87333 Chronic venous hypertension (idiopathic) with ulcer and inflammation of bilateral lower extremity: Secondary | ICD-10-CM | POA: Diagnosis not present

## 2021-06-18 DIAGNOSIS — L97821 Non-pressure chronic ulcer of other part of left lower leg limited to breakdown of skin: Secondary | ICD-10-CM | POA: Diagnosis not present

## 2021-06-21 DIAGNOSIS — L97811 Non-pressure chronic ulcer of other part of right lower leg limited to breakdown of skin: Secondary | ICD-10-CM | POA: Diagnosis not present

## 2021-06-21 DIAGNOSIS — I87333 Chronic venous hypertension (idiopathic) with ulcer and inflammation of bilateral lower extremity: Secondary | ICD-10-CM | POA: Diagnosis not present

## 2021-06-21 DIAGNOSIS — L97821 Non-pressure chronic ulcer of other part of left lower leg limited to breakdown of skin: Secondary | ICD-10-CM | POA: Diagnosis not present

## 2021-06-21 DIAGNOSIS — I5032 Chronic diastolic (congestive) heart failure: Secondary | ICD-10-CM | POA: Diagnosis not present

## 2021-06-21 DIAGNOSIS — G4733 Obstructive sleep apnea (adult) (pediatric): Secondary | ICD-10-CM | POA: Diagnosis not present

## 2021-06-21 DIAGNOSIS — I110 Hypertensive heart disease with heart failure: Secondary | ICD-10-CM | POA: Diagnosis not present

## 2021-06-25 DIAGNOSIS — L97821 Non-pressure chronic ulcer of other part of left lower leg limited to breakdown of skin: Secondary | ICD-10-CM | POA: Diagnosis not present

## 2021-06-25 DIAGNOSIS — I5032 Chronic diastolic (congestive) heart failure: Secondary | ICD-10-CM | POA: Diagnosis not present

## 2021-06-25 DIAGNOSIS — I87333 Chronic venous hypertension (idiopathic) with ulcer and inflammation of bilateral lower extremity: Secondary | ICD-10-CM | POA: Diagnosis not present

## 2021-06-25 DIAGNOSIS — I110 Hypertensive heart disease with heart failure: Secondary | ICD-10-CM | POA: Diagnosis not present

## 2021-06-25 DIAGNOSIS — L97811 Non-pressure chronic ulcer of other part of right lower leg limited to breakdown of skin: Secondary | ICD-10-CM | POA: Diagnosis not present

## 2021-06-25 DIAGNOSIS — G4733 Obstructive sleep apnea (adult) (pediatric): Secondary | ICD-10-CM | POA: Diagnosis not present

## 2021-06-28 ENCOUNTER — Encounter (HOSPITAL_BASED_OUTPATIENT_CLINIC_OR_DEPARTMENT_OTHER): Payer: Medicare Other | Attending: Internal Medicine | Admitting: Internal Medicine

## 2021-06-28 ENCOUNTER — Other Ambulatory Visit: Payer: Self-pay

## 2021-06-28 DIAGNOSIS — I87339 Chronic venous hypertension (idiopathic) with ulcer and inflammation of unspecified lower extremity: Secondary | ICD-10-CM | POA: Diagnosis present

## 2021-06-28 DIAGNOSIS — L97821 Non-pressure chronic ulcer of other part of left lower leg limited to breakdown of skin: Secondary | ICD-10-CM | POA: Insufficient documentation

## 2021-06-28 DIAGNOSIS — I87333 Chronic venous hypertension (idiopathic) with ulcer and inflammation of bilateral lower extremity: Secondary | ICD-10-CM | POA: Insufficient documentation

## 2021-06-28 DIAGNOSIS — I890 Lymphedema, not elsewhere classified: Secondary | ICD-10-CM | POA: Insufficient documentation

## 2021-06-28 DIAGNOSIS — Z6841 Body Mass Index (BMI) 40.0 and over, adult: Secondary | ICD-10-CM | POA: Diagnosis not present

## 2021-06-28 DIAGNOSIS — L97811 Non-pressure chronic ulcer of other part of right lower leg limited to breakdown of skin: Secondary | ICD-10-CM | POA: Insufficient documentation

## 2021-06-28 NOTE — Progress Notes (Signed)
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 is back at [facility] he says he has not been using the external compression pumps. Silver alginate to the wounds 5/4; he has good edema control bilaterally. He has not been using the compression pumps he tells [place] his wife is coming home from [place] today. He does not have any open wounds on the right leg continued large collection of small wounds on the left anterior leg extending medially into laterally nothing much posteriorly on the left. 6/1. He has a smattering of small wounds anteriorly on the left  and a larger but superficial wound on the left posterior calf. There is nothing open on the right we have been using silver alginate on the left I will change that the [facility] today 6/22; there is nothing open on his right leg. He has a smattering of small eschars on the left anterior lower leg but no open wounds per se. Somebody applied the compression wrap on the right leg in a very irregular fashion. He has a very tender area on the right medial ankle. 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 Not surprisingly comes back in with wounds on his bilateral legs at the level of where his external compression garment was  tight superiorly. He tells me he is using his pumps once every second day. 7/20; 2-week follow-up the patient has not been using his compression pumps his wife was transferred to a new nursing home. He has 1 wound on the medial left lower leg and a small area on the right lateral 8/17; patient arrives today with only a smattering of small wounds on the left anterior lower leg most of these are eschared and look benign. There is nothing on his right leg. We have been using silver alginate under 4-layer compression 8/31; patient's wounds on the left anterior lower leg are larger. There is still nothing open on the right toe although we did put compression wraps on this last time. He has home health. Says he is used his compression pumps twice in the last 3 days. Obviously not sufficient 9/14; 2-week follow-up. We discharged him in his own zippered stocking. Apparently home health helped him change this and noted weeping edema fluid therefore put him back in a wrap he arrives in the clinic today with no open wounds on the right innumerable small broken areas on the anterior left calf. Uncontrolled edema above the level of the wrap compatible with lymphedema 9/27 2-week follow-up. He arrives today with the left anterior lower extremity looking better. On the right he has a small open area posteriorly. I am going to put him back in compression on both sides. He claims compliance with his compression pumps at home twice a day he has home health changing his dressing 10/26; 1 month follow-up he has home health changing his dressings there is no open wound on the right leg he has extremitease stockings and external compression pumps which he says he is using once a day. The left leg is always been a more difficult site and although it is difficult to identify any precise open wound he has several leaking areas especially anteriorly and superiorly and at the edge of his wraps 11/9; 2-week follow-up. He has  his extremities stocking on the right. He says he has been using his compression pumps once a day. He has 1 remaining wound on the left anterior mid tibia which looks healthy his edema control is otherwise good on the left 11/30; there is now a 3-week follow-up. We use his own compression stocking on the right last time as he has no open wounds. He apparently developed a UTI received a course of ciprofloxacin. He did not wear his compression pumps and I wonder whether he actually was using his stocking. Home health put a compression wrap on his right leg last week. He has multiple small open areas on the left anterior leg this week. 12/7; still a cluster of small areas on the left lateral calf  and the right lateral calf. He has home health changing his dressings. For some reason he will not use compression pumps reliably this week because his wife spent 2 days in the hospital with anemia 12/21; he continues to have a cluster of small areas predominantly on the left lateral but also the left anterior lower leg. More defined wounds on the right anterior and right medial. The he says he is using the compression pumps every other day. I have never understood the issue for this noncompliant. He still has home health coming out 1/11 the patient continues to have multiple small open areas on the left lateral left anterior and medial lower leg anteriorly. He also has a small punched-out painful area in the crease of his left ankle which I think is probably a rapid injury. Very tender. I debrided in this area very adherent debris. Also small areas on the right lateral calf. We have been using silver alginate. His compression pump usage is probably marginal 2/8; the patient comes back in with his legs essentially in the same condition. He has multiple small areas on his bilateral lower extremities left greater than right anteriorly and posteriorly. A lot of these have small eschars on top some of them do not.  His edema control is marginal. He says he uses his pumps once a day. He has home health changing his dressing 4-layer compression silver alginate as the primary dressing 3/1; Marked deterioration this visit. He has multiple small areas across the entirety of the lateral anterior and medial left lower leg. I am actually concerned that he may be on his way to a more substantive degree of breakdown in this area. He is using his compression pumps 4 times per week. He has home health changing this dressing week 3/29; again not much improvement from last time he is here significant bilateral small wounds for the most part skin breakdown. The larger areas are on the left lateral. Small areas bilaterally. He has poor edema control. Relates that he is not using his compression pumps with any reliability. He states his wife was in the hospital last week. He does not complain of chest pain or shortness of breath 4/26; patient presents for 1 month follow-up. He reports no issues. He states that he last used his lymphedema pumps several weeks ago. He has been having his legs wrapped bilaterally with home health with 4-layer compression and silver alginate underneath. He has no complaints today. 5/31; patient presents for 1 month follow-up. He tolerates the compression wraps well. He states that home health continues to come out twice weekly T o change theses. He is unable to use his lymphedema pumps due to the fact that he has to take his shoes off. He states he cannot put his shoes on by himself. He denies any issues today. He denies signs of infection. 6/28; patient presents for 1 month follow-up. He has been tolerating compression wraps well. He states he developed a wound to the anterior part of his ankle due to the positioning of the padding placed by the home health nurse rubbing against the area. He states he noticed this getting worse in the last 2 weeks but this has since been corrected. He reports  increased soreness to the specific area. Overall he is doing well 8/23; the patient arrives have not been seen here in almost 2 months. He still lives in [place]. He has [facility] home health twice a week that is the only dressing changes he gets.  He reports that he wears these surgical sandals but does not feel he can get them on and off enough to use the pumps on a daily basis in fact he is only using them twice a week the rest of the time he leaves this very swollen left greater than right foot in the shoes. He arrives in clinic today with a cluster of small wounds all over the left leg. Very dry chronically inflamed skin in his left lower leg. He is actually better on the right although there are open areas laterally and posteriorly 9/27; the patient comes in for his monthly visit with 2 small open areas on the left and 1 on the right. His edema control is excellent bilaterally and actually the condition of his skin is quite a bit better 2. He says he occasionally uses his compression pumps, he is certainly not been compliant in this area . He does say that he has been using more of his Lasix.. This is his closest I am seeing this man to healed in quite a long time. We are still using silver alginate on anything that is open under 4-layer compression bilaterally. We have also been using TCA and Cetaphil to the skin liberally 10/11; patient presents for follow-up. He has no issues or complaints today. He denies signs of infection. Electronic Signature(s) Signed: 06/28/2021 1:18:37 PM By: Kalman Shan DO Entered By: Kalman Shan on 06/28/2021 13:15:45 -------------------------------------------------------------------------------- Physical Exam Details Patient Name: Date of Service: [name] [name], [name] Sakai RDIN [name]. 06/28/2021 12:30 PM Medical Record Number: [id number] Patient Account Number: [id number] Date of Birth/Sex: Treating RN: 01-Feb-1956 (65 y.o. [name] [name] Primary Care Provider: Consuello [name] Other Clinician: Referring Provider: Treating Provider/Extender: [name] [name] in Treatment: 71 Constitutional respirations regular, non-labored and within target range for patient.. Cardiovascular 2+ dorsalis pedis/posterior tibialis pulses. Psychiatric pleasant and cooperative. Notes Bilateral lower extremities with lymphedema skin changes. He has 1 small open wound on each leg limited to skin breakdown. Excellent edema control. No signs of infection. Electronic Signature(s) Signed: 06/28/2021 1:18:37 PM By: Kalman Shan DO Entered By: Kalman Shan on 06/28/2021 13:16:43 -------------------------------------------------------------------------------- Physician Orders Details Patient Name: Date of Service: [name] [name], [name] [place] [name]. 06/28/2021 12:30 PM Medical Record Number: [id number] Patient Account Number: [id number] Date of Birth/Sex: Treating RN: [birth year] (65 y.o. [name] Primary Care Provider: Consuello [name] Other Clinician: Referring Provider: Treating Provider/Extender: [name] [name] in Treatment: 37 Verbal / Phone Orders: No Diagnosis Coding ICD-10 Coding Code Description [phone] Chronic venous hypertension (idiopathic) with ulcer and inflammation of bilateral lower extremity I89.0 Lymphedema, not elsewhere classified L97.821 Non-pressure chronic ulcer of other part of left lower leg limited to breakdown of skin L97.811 Non-pressure chronic ulcer of other part of right lower leg limited to breakdown of skin Follow-up Appointments ppointment in 2 weeks. - with Dr. Dellia Nims Return A Bathing/ Shower/ Hygiene May shower with protection but do not get wound dressing(s) wet. Edema Control - Lymphedema / SCD / Other Lymphedema Pumps. Use Lymphedema pumps on leg(s) 2-3 times a day for 45-60 minutes. If wearing any wraps or hose, do not remove them. Continue exercising as instructed. Elevate legs to the level of the  heart or above for 30 minutes daily and/or when sitting, a frequency of: Avoid standing for long periods of time. Exercise regularly Other Edema Control Orders/Instructions: - Bring compression stockings to next visit Non Wound Condition Right Lower Extremity pply the following to affected area as directed: -  4 layer compression wrap A Home Health No change in wound care orders this week; continue Home Health for wound care. May utilize formulary equivalent dressing for wound treatment orders unless otherwise specified. Other Home Health Orders/Instructions: - [facility] 2 x a week Wound Treatment Wound #65 - Lower Leg Wound Laterality: Left, Anterior, Distal Cleanser: Soap and Water (Home Health) 2 x Per Week/30 Days Discharge Instructions: May shower and wash wound with dial antibacterial soap and water prior to dressing change. Cleanser: Normal Saline (Home Health) (Generic) 2 x Per Week/30 Days Discharge Instructions: Cleanse the wound with Normal Saline prior to applying a clean dressing using gauze sponges, not tissue or cotton balls. Peri-Wound Care: Triamcinolone 15 (g) 2 x Per Week/30 Days Discharge Instructions: Use triamcinolone 15 (g) as directed Peri-Wound Care: Sween Lotion (Moisturizing lotion) (Home Health) (Generic) 2 x Per Week/30 Days Discharge Instructions: Apply moisturizing lotion as directed Prim Dressing: KerraCel Ag Gelling Fiber Dressing, 4x5 in (silver alginate) (Home Health) (Generic) 2 x Per Week/30 Days ary Discharge Instructions: Apply silver alginate to wound bed as instructed Secondary Dressing: Woven Gauze Sponge, Non-Sterile 4x4 in (Home Health) (Generic) 2 x Per Week/30 Days Discharge Instructions: Apply over primary dressing as directed. Secondary Dressing: ABD Pad, 5x9 (Home Health) 2 x Per Week/30 Days Discharge Instructions: Apply over primary dressing as directed. Compression Wrap: FourPress (4 layer compression wrap) (Home Health) (Generic) 2 x Per  Week/30 Days Discharge Instructions: Apply four layer compression as directed. Electronic Signature(s) Signed: 06/28/2021 1:18:37 PM By: Kalman Shan DO Entered By: Kalman Shan on 06/28/2021 13:16:57 -------------------------------------------------------------------------------- Problem List Details Patient Name: Date of Service: [name] [name], [name] [place] [name]. 06/28/2021 12:30 PM Medical Record Number: [id number] Patient Account Number: [id number] Date of Birth/Sex: Treating RN: 1956-07-28 (64 y.o. [name] Primary Care Provider: Consuello [name] Other Clinician: Referring Provider: Treating Provider/Extender: [name] [name] in Treatment: 62 Active Problems ICD-10 Encounter Code Description Active Date MDM Diagnosis I87.333 Chronic venous hypertension (idiopathic) with ulcer and inflammation of 11/25/2019 No Yes bilateral lower extremity I89.0 Lymphedema, not elsewhere classified 11/25/2019 No Yes L97.821 Non-pressure chronic ulcer of other part of left lower leg limited to breakdown 11/25/2019 No Yes of skin L97.811 Non-pressure chronic ulcer of other part of right lower leg limited to breakdown 11/25/2019 No Yes of skin Inactive Problems ICD-10 Code Description Active Date Inactive Date L03.115 Cellulitis of right lower limb 03/09/2020 03/09/2020 Resolved Problems Electronic Signature(s) Signed: 06/28/2021 1:18:37 PM By: Kalman Shan DO Entered By: Kalman Shan on 06/28/2021 13:15:09 -------------------------------------------------------------------------------- Progress Note Details Patient Name: Date of Service: [name] [name], [name] [place] [name]. 06/28/2021 12:30 PM Medical Record Number: [id number] Patient Account Number: [id number] Date of Birth/Sex: Treating RN: 02-17-1956 (65 y.o. [name] [name] Primary Care Provider: Consuello [name] Other Clinician: Referring Provider: Treating Provider/Extender: [name] [name] in Treatment:  79 Subjective Chief Complaint Information obtained from Patient Chronic venous hypertension with ulcer and inflammation, lymphedema 06/13/2019; patient returns to clinic with wound areas on his bilateral lower extremities 11/25/2019; patient returns to clinic with the usual innumerable small wounds on his bilateral lower extremities in the setting of chronic venous inflammation and lymphedema History of Present Illness (HPI) The following HPI elements were documented for the patient's wound: Location: both legs Quality: Patient reports No Pain. long history of chronic venous hypertension,chf,morbid obesity. s/p gsv ablation. cva 2oo4. sleep apnea andhbp. breakdown of skin both legs around 2 months ago. treated here for this in 2015. no .dm. 05/09/2016 -- he  had his arterial studies done last week and his right ABI was 1.3 and his left ABI was 1.4. His right toe brachial index was 0.98 and on the left was 0.9. Venous studies have only be done today and reports are awaited. 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 Donnetta Hutching. He had a arterial study done which showed a right ABI of 1.3 which is within normal limits at rest and a left ABI of 1.4 which is within normal limits at rest and may be falsely elevated. The right toe brachial index was 0.98 and the left toe brachial index was 0.90 05/30/2016 -- seen by Dr. Althea Charon -- is known to have a prior laser ablation of his left great saphenous vein in 2009. Prior to that he had 2 ablations of the odd same vein by interventional radiology. As noted to have severe venous hypertension bilaterally. 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 His right great saphenous vein is somewhat dilated but no evidence of reflux. The  only deep venous reflux demonstrated was in his left common femoral vein. After every consideration Dr. Donnetta Hutching recommended reattempt at ablation versus removal of his left great saphenous vein in the operating room with the standard vein stripping technique. The patient would consider this and let him know again. 06/20/16 patient continues to wear a juxtalite on the right leg without any open areas. On the lateral aspect of his left leg he has 4 wounds and a small area on the medial area of the left leg. Using silver alginate under Profore 06/27/16 still no open areas on the right leg. On the lateral aspect of his left leg he has 4 wounds which continue to have a nonviable surface and wheeze been using silver alginate under Profore 07/04/2016 -- patient hasn't yet to contact his vascular surgeon regarding plans for surgical intervention and I believe he is trying his best to avoid surgery. I have again discussed with him the futility of trying to heal this and keep it healed, if he does not agree to surgical intervention 07/11/2016 -- the patient has not had any juxta light ordered for at least 3 years and we will order him a pair. 08/08/2016 -- he has been approved for Apligraf and they will get this ready for him next week 08/15/2016 -- he has his first Apligraf applied today 08/29/2016 -- he has had his second application of Apligraf today 09/12/2016 -- his Apligraf has not arrived today due to the holiday 09/19/2016 -- he has had his third application of Apligraf today 10/03/2016 -- he has had his fourth application of Apligraf today. 10/18/2016 -- his next Apligraf has not arrived today. He has had chronic problems with his back and was to start on steroids and I have told him there are no objections against this. He is also taking appropriate medications as per his orthopedic doctor. 10/24/2016 -- he is here for his fifth application of Apligraf today. 01/09/2017 -- is been having repeated  falls and problems with his back and saw a spine surgeon who has recommended holding his anticoagulation and will have some epidural injections in a few weeks. 03/13/2017 - he had been doing very well with his left lower extremity and the ulcerations that come down significantly. However last week he may have  hit himself against a metal cabinet and has started having abrasions and because of this has started weeping from the right lateral calf. He has not used his compression since morning and his right lower extremity has markedly increased and lymphedema 03/27/17; the patient appears to be doing very well only a small cluster of wounds on the right lateral lower extremity. Most of the areas on his left anterior and left posterior leg are closed the wrong way to closing. His compression slipped down today there is irritation where the wrap edge was but no evidence of infection 04/24/2017 -- the patient has been using his lymphedema pumps and is also wearing his new compression on the right lower extremity. 05/01/2017 -- he has begun using his lymphedema pumps for a longer period of time but unfortunately had a fall and may have bruised his left lateral lower extremity under the 4-layer compression wrap and has multiple ulcerations in this area today 06/05/2017 -- after examination today he is noted to have taken a significant turn for the worse with multiple open ulcerations on his left lower calf and anterior leg. Lymphedema is better controlled and there is no evidence of cellulitis. I believe the patient is not being compliant with his lymphedema pumps. 06/12/2017 -- the patient did not come for his nurse visit change to the left lower extremity on Friday, as advised. He has not been doing his compression appropriately and now has developed a ulcerated area on the right lower extremity. He also has not been using his lymphedema pumps appropriately. in addition to this the patient tells me that he  and his wife are going to the beach this coming Sunday for over a week 06/26/2017 -- the patient is back after 2 weeks when he had gone on vacation and his treatment was substandard and he did not do his lymphedema pump. He does not have any systemic symptoms 08/07/2017 -- he kept his compression stockings all week and says he has been using his compression wraps on the right lower leg. He is also saying he is diligent with his lymphedema pumps. 08/21/17; using his lymphedema pumps about twice a week. He keeps his compression wraps on the left lower leg. He has his compression stocking on the right leg 12/14/18patient continues to be noncompliant with the lymphedema pumps. He has extremitease stockings on the right leg. He has a cluster of wounds on the left leg we have been using silver alginate 09/12/2017 -- over the Christmas holidays his right leg has become extremely large with lymphedema and weeping with ulceration and this is a huge step backward. I understand he has not been compliant with his diet or his lymphedema pumps 09/19/17 on evaluation today patient appears to be doing somewhat poorly due to the significant amount of fluid buildup in the right lower extremity especially. This has been somewhat macerated due to the fact that he is having so much drainage. No fevers, chills, nausea, or vomiting noted at this time. Patient has been tolerating the dressing changes but notes that it doesn't take very long for the weeping to build up. He has not been using his compression pumps for lymphedema unfortunately as I do feel like this will be beneficial for him. No fevers, chills, nausea, or vomiting noted at this time. Patient has no evidence of dementia that is definitely noncompliant. 09/25/17; patient arrives with a lot of swelling in the right leg. Necrotic surface to the wound on the right lateral leg extending posteriorly.  A lot of drainage and the right foot with maceration of the skin on  the posterior right foot. There is smattering of wounds on the left lateral leg anteriorly and laterally. The edema control here is much better. He is definitely noncompliant and tells me he uses a compression pumps at most twice a week 10/02/17; patient's major wound is on the right lateral leg extending posteriorly although this does not look worse than last week. Surface looks better. He has a small collection of wounds on the left lateral leg and anterior left lateral leg. Edema control is better he does not use his compression pumps. He looked somewhat short of breath 10/09/17; the major wound is on the right lateral leg covered in tightly adherent necrotic debris this week. Quite a bit different from last week. He has the usual constellation of small superficial areas on the left anterior and left lateral leg. We had been using silver alginate 10/16/17; the patient's major wound on the right lateral leg has a much better surfaces weak using Iodoflex. He has a constellation of small superficial areas on the left anterior and left lateral leg which are roughly unchanged. Noncompliant with his compression pumps using them perhaps once or twice per week 10/23/17; the patient's major wound is on the right lateral anterior lateral leg. Much better surface using Iodoflex. However he has very significant edema in the right leg today. Superficial areas on the left lateral leg are roughly unchanged his edema is better here. 10/30/17; the patient's major wound is on the right lateral anterior lower leg. Not much difference today. I changed him from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 stroke/TIA. Medical A Surgical History Notes nd Constitutional Symptoms (General Health) h/o open leg wound , h/o CVA , TIA , varicose veins Respiratory asbestosis Genitourinary nephrolithiasis Objective Constitutional respirations regular, non-labored and within target range for patient.. Vitals Time Taken: 12:44 PM, Height: 70 in, Weight: 360 lbs, BMI: 51.6, Temperature: 98.1 F, Pulse: 64 bpm, Respiratory Rate: 22 breaths/min, Blood Pressure: 184/100 mmHg. Cardiovascular 2+ dorsalis pedis/posterior tibialis pulses. Psychiatric pleasant and cooperative. General Notes: Bilateral lower extremities with lymphedema skin changes. He has 1 small open wound on each leg limited to skin breakdown. Excellent edema control. No signs of infection. Integumentary (Hair, Skin) Wound #65 status is Open. Original cause of wound was Gradually Appeared. The date acquired was: 09/08/2019. The wound has been in treatment 83 weeks. The wound is located on the [facility] - Closed Lower Leg. The wound measures 0.7cm length x 0.6cm width x 0.1cm depth; 0.33cm^2 area and 0.033cm^3 volume. There is Fat Layer (Subcutaneous Tissue) exposed. There is no tunneling or undermining noted. There is a medium amount of purulent drainage noted. The wound margin is distinct with the outline attached to the wound base. There is large (67-100%) pink granulation within the wound bed. There is no necrotic tissue within the wound bed. Wound #68 status is Healed - Epithelialized. Original cause of wound was Gradually Appeared. The  date acquired was: 03/23/2020. The wound has been in treatment 66 weeks. The wound is located on the Left,Proximal,Anterior Lower Leg. The wound measures 0cm length x 0cm width x 0cm depth; 0cm^2 area and 0cm^3 volume. Wound #73 status is Healed - Epithelialized. Original cause of wound was Gradually Appeared. The date acquired was: 08/03/2020. The wound has been in treatment 45 weeks. The wound is located on the Right,Lateral Lower Leg. The wound measures 0cm length x 0cm width x 0cm depth; 0cm^2 area and 0cm^3 volume. Wound #77 status is Healed - Epithelialized. Original cause of wound was Gradually Appeared. The date acquired was: 11/16/2020. The wound has been in treatment 32 weeks. The wound is located on the Left,Proximal,Lateral Lower Leg. The wound measures 0cm length x 0cm width x 0cm depth; 0cm^2 area and 0cm^3 volume. 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 These are limited to skin breakdown. No signs of infection on exam. I recommended continuing silver alginate under compression wrap. Hopefully patient will be healed in the next 2 weeks. Asked to bring his compression stockings to next clinic visit. Procedures Wound #68 Pre-procedure diagnosis of Wound #68 is a Lymphedema located on the Left,Proximal,Anterior Lower Leg . There was a Four Layer Compression Therapy Procedure by [name], RN. Post procedure Diagnosis Wound #68: Same as Pre-Procedure There was a Four Layer Compression Therapy Procedure by [name], RN. Post procedure Diagnosis Wound #: Same as Pre-Procedure Plan Follow-up Appointments: Return  Appointment in 2 weeks. - with Dr. Arcola Jansky Shower/ Hygiene: May shower with protection but do not get wound dressing(s) wet. Edema Control - Lymphedema / SCD / Other: Lymphedema Pumps. Use Lymphedema pumps on leg(s) 2-3 times a day for 45-60 minutes. If wearing any wraps or hose, do not remove them. Continue exercising as instructed. Elevate legs to the level of the heart or above for 30 minutes daily and/or when sitting, a frequency of: Avoid standing for long periods of time. Exercise regularly Other Edema Control Orders/Instructions: - Bring compression stockings to next visit Non Wound Condition: Apply the following to affected area as directed: - 4 layer compression wrap Home Health: No change in wound care orders this week; continue Home Health for wound care. May utilize formulary equivalent dressing for wound treatment orders unless otherwise specified. Other Home Health Orders/Instructions: - [facility] 2 x a week WOUND #65: - Lower Leg Wound Laterality: Left, Anterior, Distal Cleanser: Soap and Water (Home Health) 2 x Per Week/30 Days Discharge Instructions: May shower and wash wound with dial antibacterial soap and water prior to dressing change. Cleanser: Normal Saline (Home Health) (Generic) 2 x Per Week/30 Days Discharge Instructions: Cleanse the wound with Normal Saline prior to applying a clean dressing using gauze sponges, not tissue or cotton balls. Peri-Wound Care: Triamcinolone 15 (g) 2 x Per Week/30 Days Discharge Instructions: Use triamcinolone 15 (g) as directed Peri-Wound Care: Sween Lotion (Moisturizing lotion) (Home Health) (Generic) 2 x Per Week/30 Days Discharge Instructions: Apply moisturizing lotion as directed Prim Dressing: KerraCel Ag Gelling Fiber Dressing, 4x5 in (silver alginate) (Home Health) (Generic) 2 x Per Week/30 Days ary Discharge Instructions: Apply silver alginate to wound bed as instructed Secondary Dressing: Woven Gauze Sponge,  Non-Sterile 4x4 in (Home Health) (Generic) 2 x Per Week/30 Days Discharge Instructions: Apply over primary dressing as directed. Secondary Dressing: ABD Pad, 5x9 (Home Health) 2 x Per Week/30 Days Discharge Instructions: Apply over primary dressing as directed. Com pression Wrap: FourPress (4 layer compression wrap) (Home Health) (Generic) 2 x Per Week/30 Days Discharge Instructions: Apply four layer compression as directed. 1. Silver alginate under compression wrap 2. Follow-up in 2 weeks Electronic Signature(s) Signed: 06/28/2021 1:18:37 PM By: Kalman Shan DO Entered By: Kalman Shan on 06/28/2021 13:18:08 -------------------------------------------------------------------------------- HxROS Details Patient Name: Date of Service: [name] [name], [name] [place] [name]. 06/28/2021 12:30 PM Medical Record Number: [id number] Patient Account Number: [id number] Date of Birth/Sex: Treating RN: 1956/01/07 (65 y.o. [name] [name] Primary Care Provider: Consuello [name] Other Clinician: Referring Provider: Treating Provider/Extender: [name] [name] in Treatment: 93 Information Obtained  From Patient Constitutional Symptoms (General Health) Medical History: Past Medical History Notes: h/o open leg wound , h/o CVA , TIA , varicose veins Eyes Medical History: Negative for: Cataracts; Glaucoma; Optic Neuritis Ear/Nose/Mouth/Throat Medical History: Negative for: Chronic sinus problems/congestion; Middle ear problems Hematologic/Lymphatic Medical History: Positive for: Lymphedema - bilateral lower legs Negative for: Anemia; Hemophilia; Human Immunodeficiency Virus Respiratory Medical History: Positive for: Sleep Apnea - obstructive Negative for: Aspiration; Asthma; Chronic Obstructive Pulmonary Disease (COPD); Tuberculosis Past Medical History Notes: asbestosis Cardiovascular Medical History: Positive for: Congestive Heart Failure - diastolic; Hypertension; Peripheral Venous  Disease Negative for: Angina; Arrhythmia; Coronary Artery Disease; Deep Vein Thrombosis; Hypotension; Myocardial Infarction; Peripheral Arterial Disease; Phlebitis; Vasculitis Gastrointestinal Medical History: Negative for: Cirrhosis ; Colitis; Crohns; Hepatitis A; Hepatitis B; Hepatitis C Endocrine Medical History: Negative for: Type I Diabetes; Type II Diabetes Genitourinary Medical History: Negative for: End Stage Renal Disease Past Medical History Notes: nephrolithiasis Immunological Medical History: Negative for: Lupus Erythematosus; Raynauds; Scleroderma Integumentary (Skin) Medical History: Negative for: History of Burn Musculoskeletal Medical History: Positive for: Osteoarthritis Negative for: Gout; Rheumatoid Arthritis; Osteomyelitis Neurologic Medical History: Negative for: Dementia; Neuropathy; Quadriplegia; Paraplegia Oncologic Medical History: Negative for: Received Chemotherapy; Received Radiation Psychiatric Medical History: Negative for: Anorexia/bulimia Immunizations Pneumococcal Vaccine: Received Pneumococcal Vaccination: No Implantable Devices No devices added Hospitalization / Surgery History Type of Hospitalization/Surgery kidney stones stroke stroke/TIA Family and Social History Cancer: Yes - Mother; Diabetes: Yes - Paternal Grandparents; Heart Disease: Yes - Siblings; Hypertension: Yes - Mother,Father,Siblings; Stroke: Yes - Paternal Grandparents; Never smoker; Marital Status - Married; Alcohol Use: Never; Drug Use: No History; Caffeine Use: Daily - SODA; Financial Concerns: No; Food, Clothing or Shelter Needs: No; Support System Lacking: No; Transportation Concerns: No Electronic Signature(s) Signed: 06/28/2021 1:18:37 PM By: Kalman Shan DO Signed: 06/28/2021 5:07:29 PM By: Deon Pilling RN, BSN Entered By: Kalman Shan on 06/28/2021 13:15:51 -------------------------------------------------------------------------------- SuperBill  Details Patient Name: Date of Service: [name] [name], [name] RDIN [name]. 06/28/2021 Medical Record Number: [id number] Patient Account Number: [id number] Date of Birth/Sex: Treating RN: 09/19/1955 (65 y.o. [name] Primary Care Provider: Consuello [name] Other Clinician: Referring Provider: Treating Provider/Extender: [name] [name] in Treatment: 55 Diagnosis Coding ICD-10 Codes Code Description [phone] Chronic venous hypertension (idiopathic) with ulcer and inflammation of bilateral lower extremity I89.0 Lymphedema, not elsewhere classified L97.821 Non-pressure chronic ulcer of other part of left lower leg limited to breakdown of skin L97.811 Non-pressure chronic ulcer of other part of right lower leg limited to breakdown of skin Facility Procedures CPT4: Code [id number] 29 fo Description: 007 BILATERAL: Application of multi-layer venous compression system; leg (below knee), including ankle and ot. ICD-10 Diagnosis Description I89.0 Lymphedema, not elsewhere classified Modifier: Quantity: 1 Physician Procedures : CPT4 Code Description Modifier [id number] - WC PHYS LEVEL 3 - EST PT ICD-10 Diagnosis Description I87.333 Chronic venous hypertension (idiopathic) with ulcer and inflammation of bilateral lower extremity I89.0 Lymphedema, not elsewhere classified  L97.821 Non-pressure chronic ulcer of other part of left lower leg limited to breakdown of skin L97.811 Non-pressure chronic ulcer of other part of right lower leg limited to breakdown of skin Quantity: 1 Electronic Signature(s) Signed: 06/28/2021 1:18:37 PM By: Kalman Shan DO Entered By: Kalman Shan on 06/28/2021 13:18:22

## 2021-06-29 NOTE — Progress Notes (Signed)
Worley Daemion W. ([id number]) , Visit Report for 06/28/2021 Arrival Information Details Patient Name: Date of Service: JA RRETT, [name] 06/28/2021 12:30 PM Medical Record Number: [id number] Patient Account Number: [id number] Date of Birth/Sex: Treating RN: 12/14/1955 (65 y.o. M) Barnhart, Jodi Primary Care Provider: Sasser, Paul Other Clinician: Referring Provider: Treating Provider/Extender: Hoffman, Jessica Sasser, Paul Weeks in Treatment: 83 Visit Information History Since Last Visit Added or deleted any medications: No Patient Arrived: Cane Any new allergies or adverse reactions: No Arrival Time: 12:39 Had a fall or experienced change in No Transfer Assistance: None activities of daily living that may affect Patient Identification Verified: Yes risk of falls: Secondary Verification Process Completed: Yes Signs or symptoms of abuse/neglect since last visito No Patient Requires Transmission-Based Precautions: No Hospitalized since last visit: No Patient Has Alerts: Yes Implantable device outside of the clinic excluding No Patient Alerts: R ABI = 1.25 cellular tissue based products placed in the center L ABI = 1.27 since last visit: Has Dressing in Place as Prescribed: Yes Has Compression in Place as Prescribed: Yes Pain Present Now: No Electronic Signature(s) Signed: 06/28/2021 5:14:50 PM By: Barnhart, Jodi Entered By: Barnhart, Jodi on 06/28/2021 12:43:39 -------------------------------------------------------------------------------- Compression Therapy Details Patient Name: Date of Service: JA RRETT, [name] 06/28/2021 12:30 PM Medical Record Number: [id number] Patient Account Number: [id number] Date of Birth/Sex: Treating RN: 02/03/1956 (65 y.o. M) Barnhart, Jodi Primary Care Provider: Sasser, Paul Other Clinician: Referring Provider: Treating Provider/Extender: Hoffman, Jessica Sasser, Paul Weeks in Treatment: 83 Compression Therapy Performed for Wound  Assessment: Wound #68 Left,Proximal,Anterior Lower Leg Performed By: Clinician Barnhart, Jodi, RN Compression Type: Four Layer Post Procedure Diagnosis Same as Pre-procedure Electronic Signature(s) Signed: 06/28/2021 5:14:50 PM By: Barnhart, Jodi Entered By: Barnhart, Jodi on 06/28/2021 13:06:08 -------------------------------------------------------------------------------- Compression Therapy Details Patient Name: Date of Service: JA RRETT, [name] 06/28/2021 12:30 PM Medical Record Number: [id number] Patient Account Number: [id number] Date of Birth/Sex: Treating RN: 03/18/1956 (65 y.o. M) Barnhart, Jodi Primary Care Provider: Sasser, Paul Other Clinician: Referring Provider: Treating Provider/Extender: Hoffman, Jessica Sasser, Paul Weeks in Treatment: 83 Compression Therapy Performed for Wound Assessment: NonWound Condition Lymphedema - Bilateral Leg Performed By: Clinician Barnhart, Jodi, RN Compression Type: Four Layer Post Procedure Diagnosis Same as Pre-procedure Electronic Signature(s) Signed: 06/28/2021 5:14:50 PM By: Barnhart, Jodi Entered By: Barnhart, Jodi on 06/28/2021 13:06:32 -------------------------------------------------------------------------------- Encounter Discharge Information Details Patient Name: Date of Service: JA RRETT, [name] 06/28/2021 12:30 PM Medical Record Number: [id number] Patient Account Number: [id number] Date of Birth/Sex: Treating RN: 04/29/1956 (65 y.o. M) Barnhart, Jodi Primary Care Provider: Sasser, Paul Other Clinician: Referring Provider: Treating Provider/Extender: Hoffman, Jessica Sasser, Paul Weeks in Treatment: 83 Encounter Discharge Information Items Discharge Condition: Stable Ambulatory Status: Cane Discharge Destination: Home Transportation: Private Auto Schedule Follow-up Appointment: Yes Clinical Summary of Care: Provided on 06/28/2021 Form Type Recipient Paper Patient Patient Electronic  Signature(s) Signed: 06/28/2021 2:51:14 PM By: Barnhart, Jodi Entered By: Barnhart, Jodi on 06/28/2021 14:51:13 -------------------------------------------------------------------------------- Lower Extremity Assessment Details Patient Name: Date of Service: JA RRETT, [name] 06/28/2021 12:30 PM Medical Record Number: [id number] Patient Account Number: [id number] Date of Birth/Sex: Treating RN: 09/04/1956 (65 y.o. M) Barnhart, Jodi Primary Care Provider: Sasser, Paul Other Clinician: Referring Provider: Treating Provider/Extender: Hoffman, Jessica Sasser, Paul Weeks in Treatment: 83 Edema Assessment Assessed: [Left: Yes] [Right: Yes] Edema: [Left: Yes] [Right: Yes] Calf Left: Right: Point of Measurement: 30 cm From Medial Instep 38 cm 40 cm Ankle Left: Right: Point of Measurement: 13 cm From   Medial Instep 25 cm 27 cm Vascular Assessment Pulses: Dorsalis Pedis Palpable: [Left:Yes] [Right:Yes] Electronic Signature(s) Signed: 06/28/2021 5:14:50 PM By: [name] Entered By: [name] on 06/28/2021 12:54:37 -------------------------------------------------------------------------------- Multi Wound Chart Details Patient Name: Date of Service: [name], [name] 06/28/2021 12:30 PM Medical Record Number: [id number] Patient Account Number: [id number] Date of Birth/Sex: Treating RN: 1956/02/09 (65 y.o. Hessie Diener Primary Care Antwan Bribiesca: [name] Other Clinician: Referring Krisa Blattner: Treating Mardi Cannady/Extender: Quentin Cornwall in Treatment: 71 Vital Signs Height(in): 70 Pulse(bpm): 71 Weight(lbs): 360 Blood Pressure(mmHg): 184/100 Body Mass Index(BMI): 52 Temperature(F): 98.1 Respiratory Rate(breaths/min): 22 Photos: [68:No Photos] [73:No Photos] Left, Distal, Anterior Lower Leg Left, Proximal, Anterior Lower Leg Right, Lateral Lower Leg Wound Location: Gradually Appeared Gradually Appeared Gradually Appeared Wounding  Event: Venous Leg Ulcer Lymphedema Lymphedema Primary Etiology: Lymphedema N/A N/A Secondary Etiology: Lymphedema, Sleep Apnea, Lymphedema, Sleep Apnea, Lymphedema, Sleep Apnea, Comorbid History: Congestive Heart Failure, Congestive Heart Failure, Congestive Heart Failure, Hypertension, Peripheral Venous Hypertension, Peripheral Venous Hypertension, Peripheral Venous Disease, Osteoarthritis Disease, Osteoarthritis Disease, Osteoarthritis 09/08/2019 03/23/2020 08/03/2020 Date Acquired: 83 65 49 Weeks of Treatment: Open Healed - Epithelialized Healed - Epithelialized Wound Status: Yes Yes Yes Clustered Wound: 1 N/A N/A Clustered Quantity: 0.7x0.6x0.1 0x0x0 0x0x0 Measurements L x W x D (cm) 0.33 0 0 A (cm) : rea 0.033 0 0 Volume (cm) : 99.90% 100.00% 100.00% % Reduction in Area: 99.90% 100.00% 100.00% % Reduction in Volume: Full Thickness Without Exposed Full Thickness Without Exposed Full Thickness Without Exposed Classification: Support Structures Support Structures Support Structures Medium N/A N/A Exudate Amount: Purulent N/A N/A Exudate Type: yellow, brown, green N/A N/A Exudate Color: Distinct, outline attached N/A N/A Wound Margin: Large (67-100%) N/A N/A Granulation Amount: Pink N/A N/A Granulation Quality: None Present (0%) N/A N/A Necrotic Amount: Fat Layer (Subcutaneous Tissue): Yes N/A N/A Exposed Structures: Fascia: No Tendon: No Muscle: No Joint: No Bone: No Medium (34-66%) N/A N/A Epithelialization: N/A Compression Therapy N/A Procedures Performed: Wound Number: 77 N/A N/A Photos: No Photos N/A N/A Left, Proximal, Lateral Lower Leg N/A N/A Wound Location: Gradually Appeared N/A N/A Wounding Event: Lymphedema N/A N/A Primary Etiology: N/A N/A N/A Secondary Etiology: Lymphedema, Sleep Apnea, N/A N/A Comorbid History: Congestive Heart Failure, Hypertension, Peripheral Venous Disease, Osteoarthritis 11/16/2020 N/A N/A Date Acquired: 46  N/A N/A Weeks of Treatment: Healed - Epithelialized N/A N/A Wound Status: No N/A N/A Clustered Wound: N/A N/A N/A Clustered Quantity: 0x0x0 N/A N/A Measurements L x W x D (cm) 0 N/A N/A A (cm) : rea 0 N/A N/A Volume (cm) : 100.00% N/A N/A % Reduction in Area: 100.00% N/A N/A % Reduction in Volume: Full Thickness Without Exposed N/A N/A Classification: Support Structures N/A N/A N/A Exudate A mount: N/A N/A N/A Exudate Type: N/A N/A N/A Exudate Color: N/A N/A N/A Wound Margin: N/A N/A N/A Granulation A mount: N/A N/A N/A Granulation Quality: N/A N/A N/A Necrotic A mount: N/A N/A N/A Exposed Structures: N/A N/A N/A Epithelialization: N/A N/A N/A Procedures Performed: Treatment Notes Electronic Signature(s) Signed: 06/28/2021 1:18:37 PM By: Kalman Shan DO Signed: 06/28/2021 5:07:29 PM By: Deon Pilling RN, BSN Entered By: Kalman Shan on 06/28/2021 13:15:15 -------------------------------------------------------------------------------- Bruno Details Patient Name: Date of Service: [name], HA [place] W. 06/28/2021 12:30 PM Medical Record Number: [id number] Patient Account Number: [id number] Date of Birth/Sex: Treating RN: 1955-12-16 (65 y.o. [name] Primary Care Rylee Huestis: [name] Other Clinician: Referring Hannalee Castor: Treating Donne Robillard/Extender: Quentin Cornwall in  Treatment: 83 Active Inactive Venous Leg Ulcer Nursing Diagnoses: Knowledge deficit related to disease process and management Potential for venous Insuffiency (use before diagnosis confirmed) Goals: Patient will maintain optimal edema control Date Initiated: 01/11/2021 Target Resolution Date: 07/12/2021 Goal Status: Active Interventions: Assess peripheral edema status every visit. Compression as ordered Treatment Activities: Therapeutic compression applied : 01/11/2021 Notes: 06/14/21: Edema control ongoing. Wound/Skin  Impairment Nursing Diagnoses: Knowledge deficit related to ulceration/compromised skin integrity Goals: Patient/caregiver will verbalize understanding of skin care regimen Date Initiated: 11/25/2019 Target Resolution Date: 07/12/2021 Goal Status: Active Ulcer/skin breakdown will have a volume reduction of 30% by week 4 Date Initiated: 11/25/2019 Date Inactivated: 01/06/2020 Target Resolution Date: 12/26/2019 Goal Status: Met Ulcer/skin breakdown will have a volume reduction of 50% by week 8 Date Initiated: 01/06/2020 Date Inactivated: 02/17/2020 Target Resolution Date: 02/05/2020 Goal Status: Met Interventions: Assess patient/caregiver ability to obtain necessary supplies Assess patient/caregiver ability to perform ulcer/skin care regimen upon admission and as needed Assess ulceration(s) every visit Notes: 06/14/21: Wound care regimen continues Electronic Signature(s) Signed: 06/28/2021 5:14:50 PM By: [name] Entered By: [name] on 06/28/2021 12:44:19 -------------------------------------------------------------------------------- Pain Assessment Details Patient Name: Date of Service: [name] 06/28/2021 12:30 PM Medical Record Number: [id number] Patient Account Number: [id number] Date of Birth/Sex: Treating RN: 1955-10-18 (65 y.o. [name] Primary Care Merleen Picazo: [name] Other Clinician: Referring Bond Grieshop: Treating Katana Berthold/Extender: Quentin Cornwall in Treatment: 76 Active Problems Location of Pain Severity and Description of Pain Patient Has Paino No Site Locations Pain Management and Medication Current Pain Management: Electronic Signature(s) Signed: 06/28/2021 5:14:50 PM By: [name] Entered By: [name] on 06/28/2021 12:43:48 -------------------------------------------------------------------------------- Patient/Caregiver Education Details Patient Name: Date of Service: [name]  10/11/2022andnbsp12:30 PM Medical Record Number: [id number] Patient Account Number: [id number] Date of Birth/Gender: Treating RN: Dec 12, 1955 (65 y.o. [name] Primary Care Physician: [name] Other Clinician: Referring Physician: Treating Physician/Extender: Quentin Cornwall in Treatment: 5 Education Assessment Education Provided To: Patient Education Topics Provided Venous: Methods: Explain/Verbal, Printed Responses: State content correctly Wound/Skin Impairment: Methods: Explain/Verbal, Printed Responses: State content correctly Electronic Signature(s) Signed: 06/28/2021 5:14:50 PM By: [name] Entered By: [name] on 06/28/2021 13:12:44 -------------------------------------------------------------------------------- Wound Assessment Details Patient Name: Date of Service: [name] Loraine W. 06/28/2021 12:30 PM Medical Record Number: [id number] Patient Account Number: [id number] Date of Birth/Sex: Treating RN: 04/20/1956 (65 y.o. [name] Primary Care Jarquez Mestre: [name] Other Clinician: Referring Curly Mackowski: Treating Giah Fickett/Extender: Quentin Cornwall in Treatment: 4 Wound Status Wound Number: 65 Primary Venous Leg Ulcer Etiology: Wound Location: Left, Distal, Anterior Lower Leg Secondary Lymphedema Wounding Event: Gradually Appeared Etiology: Date Acquired: 09/08/2019 Wound Open Weeks Of Treatment: 83 Status: Clustered Wound: Yes Comorbid Lymphedema, Sleep Apnea, Congestive Heart Failure, History: Hypertension, Peripheral Venous Disease, Osteoarthritis Photos Wound Measurements Length: (cm) 0.7 Width: (cm) 0.6 Depth: (cm) 0.1 Clustered Quantity: 1 Area: (cm) 0.33 Volume: (cm) 0.033 % Reduction in Area: 99.9% % Reduction in Volume: 99.9% Epithelialization: Medium (34-66%) Tunneling: No Undermining: No Wound Description Classification: Full Thickness Without Exposed Support  Structures Wound Margin: Distinct, outline attached Exudate Amount: Medium Exudate Type: Purulent Exudate Color: yellow, brown, green Foul Odor After Cleansing: No Slough/Fibrino No Wound Bed Granulation Amount: Large (67-100%) Exposed Structure Granulation Quality: Pink Fascia Exposed: No Necrotic Amount: None Present (0%) Fat Layer (Subcutaneous Tissue) Exposed: Yes Tendon Exposed: No Muscle Exposed: No Joint Exposed: No Bone Exposed: No Treatment Notes Wound #65 (  Lower Leg) Wound Laterality: Left, Anterior, Distal Cleanser Soap and Water Discharge Instruction: May shower and wash wound with dial antibacterial soap and water prior to dressing change. Normal Saline Discharge Instruction: Cleanse the wound with Normal Saline prior to applying a clean dressing using gauze sponges, not tissue or cotton balls. Peri-Wound Care Triamcinolone 15 (g) Discharge Instruction: Use triamcinolone 15 (g) as directed Sween Lotion (Moisturizing lotion) Discharge Instruction: Apply moisturizing lotion as directed Topical Primary Dressing KerraCel Ag Gelling Fiber Dressing, 4x5 in (silver alginate) Discharge Instruction: Apply silver alginate to wound bed as instructed Secondary Dressing Woven Gauze Sponge, Non-Sterile 4x4 in Discharge Instruction: Apply over primary dressing as directed. ABD Pad, 5x9 Discharge Instruction: Apply over primary dressing as directed. Secured With Compression Wrap FourPress (4 layer compression wrap) Discharge Instruction: Apply four layer compression as directed. Compression Stockings Add-Ons Electronic Signature(s) Signed: 06/28/2021 5:14:50 PM By: [name] Signed: 06/29/2021 1:46:44 PM By: Sandre Kitty Entered By: Sandre Kitty on 06/28/2021 13:08:58 -------------------------------------------------------------------------------- Wound Assessment Details Patient Name: Date of Service: [name], HA [place] W. 06/28/2021 12:30 PM Medical  Record Number: [id number] Patient Account Number: [id number] Date of Birth/Sex: Treating RN: 11/11/1955 (65 y.o. [name] Primary Care Makelle Marrone: [name] Other Clinician: Referring Lotus Santillo: Treating Elinora Weigand/Extender: Quentin Cornwall in Treatment: 51 Wound Status Wound Number: 68 Primary Lymphedema Etiology: Wound Location: Left, Proximal, Anterior Lower Leg Wound Healed - Epithelialized Wounding Event: Gradually Appeared Status: Date Acquired: 03/23/2020 Comorbid Lymphedema, Sleep Apnea, Congestive Heart Failure, Weeks Of Treatment: 66 History: Hypertension, Peripheral Venous Disease, Osteoarthritis Clustered Wound: Yes Wound Measurements Length: (cm) Width: (cm) Depth: (cm) Area: (cm) Volume: (cm) 0 % Reduction in Area: 100% 0 % Reduction in Volume: 100% 0 0 0 Wound Description Classification: Full Thickness Without Exposed Support Structur es Electronic Signature(s) Signed: 06/28/2021 5:14:50 PM By: [name] Entered By: [name] on 06/28/2021 13:10:15 -------------------------------------------------------------------------------- Wound Assessment Details Patient Name: Date of Service: [name], HA [place] W. 06/28/2021 12:30 PM Medical Record Number: [id number] Patient Account Number: [id number] Date of Birth/Sex: Treating RN: 1956-02-17 (65 y.o. [name] Primary Care Kedarius Aloisi: [name] Other Clinician: Referring Kristinia Leavy: Treating Keita Demarco/Extender: Quentin Cornwall in Treatment: 4 Wound Status Wound Number: 73 Primary Lymphedema Etiology: Wound Location: Right, Lateral Lower Leg Wound Healed - Epithelialized Wounding Event: Gradually Appeared Status: Date Acquired: 08/03/2020 Comorbid Lymphedema, Sleep Apnea, Congestive Heart Failure, Weeks Of Treatment: 45 History: Hypertension, Peripheral Venous Disease, Osteoarthritis Clustered Wound: Yes Wound Measurements Length:  (cm) Width: (cm) Depth: (cm) Area: (cm) Volume: (cm) 0 % Reduction in Area: 100% 0 % Reduction in Volume: 100% 0 0 0 Wound Description Classification: Full Thickness Without Exposed Support Structur es Electronic Signature(s) Signed: 06/28/2021 5:14:50 PM By: [name] Entered By: [name] on 06/28/2021 13:10:37 -------------------------------------------------------------------------------- Wound Assessment Details Patient Name: Date of Service: [name], HA [place] W. 06/28/2021 12:30 PM Medical Record Number: [id number] Patient Account Number: [id number] Date of Birth/Sex: Treating RN: [birth year] (66 y.o. [name] Primary Care Sheryl Towell: [name] Other Clinician: Referring Bernhard Koskinen: Treating Cathryne Mancebo/Extender: Quentin Cornwall in Treatment: 12 Wound Status Wound Number: 77 Primary Lymphedema Etiology: Wound Location: Left, Proximal, Lateral Lower Leg Wound Healed - Epithelialized Wounding Event: Gradually Appeared Status: Date Acquired: 11/16/2020 Comorbid Lymphedema, Sleep Apnea, Congestive Heart Failure, Weeks Of Treatment: 32 History: Hypertension, Peripheral Venous Disease, Osteoarthritis Clustered Wound: No Wound Measurements Length: (cm) Width: (cm) Depth: (cm) Area: (cm) Volume: (cm) 0 % Reduction in Area: 100% 0 %  Reduction in Volume: 100% 0 0 0 Wound Description Classification: Full Thickness Without Exposed Support Structur es Electronic Signature(s) Signed: 06/28/2021 5:14:50 PM By: Barnhart, Jodi Entered By: Barnhart, Jodi on 06/28/2021 13:10:58 -------------------------------------------------------------------------------- Vitals Details Patient Name: Date of Service: JA RRETT, [name] 06/28/2021 12:30 PM Medical Record Number: [id number] Patient Account Number: [id number] Date of Birth/Sex: Treating RN: 11/23/1955 (65 y.o. M) Barnhart, Jodi Primary Care Provider: Sasser, Paul Other  Clinician: Referring Provider: Treating Provider/Extender: Hoffman, Jessica Sasser, Paul Weeks in Treatment: 83 Vital Signs Time Taken: 12:44 Temperature (°F): 98.1 Height (in): 70 Pulse (bpm): 64 Weight (lbs): 360 Respiratory Rate (breaths/min): 22 Body Mass Index (BMI): 51.6 Blood Pressure (mmHg): 184/100 Reference Range: 80 - 120 mg / dl Electronic Signature(s) Signed: 06/28/2021 5:14:50 PM By: Barnhart, Jodi Entered By: Barnhart, Jodi on 06/28/2021 12:45:49 

## 2021-07-01 DIAGNOSIS — I5032 Chronic diastolic (congestive) heart failure: Secondary | ICD-10-CM | POA: Diagnosis not present

## 2021-07-01 DIAGNOSIS — G4733 Obstructive sleep apnea (adult) (pediatric): Secondary | ICD-10-CM | POA: Diagnosis not present

## 2021-07-01 DIAGNOSIS — L97821 Non-pressure chronic ulcer of other part of left lower leg limited to breakdown of skin: Secondary | ICD-10-CM | POA: Diagnosis not present

## 2021-07-01 DIAGNOSIS — I87333 Chronic venous hypertension (idiopathic) with ulcer and inflammation of bilateral lower extremity: Secondary | ICD-10-CM | POA: Diagnosis not present

## 2021-07-01 DIAGNOSIS — L97811 Non-pressure chronic ulcer of other part of right lower leg limited to breakdown of skin: Secondary | ICD-10-CM | POA: Diagnosis not present

## 2021-07-01 DIAGNOSIS — I110 Hypertensive heart disease with heart failure: Secondary | ICD-10-CM | POA: Diagnosis not present

## 2021-07-05 DIAGNOSIS — I5032 Chronic diastolic (congestive) heart failure: Secondary | ICD-10-CM | POA: Diagnosis not present

## 2021-07-05 DIAGNOSIS — I87333 Chronic venous hypertension (idiopathic) with ulcer and inflammation of bilateral lower extremity: Secondary | ICD-10-CM | POA: Diagnosis not present

## 2021-07-05 DIAGNOSIS — L97821 Non-pressure chronic ulcer of other part of left lower leg limited to breakdown of skin: Secondary | ICD-10-CM | POA: Diagnosis not present

## 2021-07-05 DIAGNOSIS — L97811 Non-pressure chronic ulcer of other part of right lower leg limited to breakdown of skin: Secondary | ICD-10-CM | POA: Diagnosis not present

## 2021-07-05 DIAGNOSIS — I110 Hypertensive heart disease with heart failure: Secondary | ICD-10-CM | POA: Diagnosis not present

## 2021-07-05 DIAGNOSIS — G4733 Obstructive sleep apnea (adult) (pediatric): Secondary | ICD-10-CM | POA: Diagnosis not present

## 2021-07-08 DIAGNOSIS — I87333 Chronic venous hypertension (idiopathic) with ulcer and inflammation of bilateral lower extremity: Secondary | ICD-10-CM | POA: Diagnosis not present

## 2021-07-08 DIAGNOSIS — L97811 Non-pressure chronic ulcer of other part of right lower leg limited to breakdown of skin: Secondary | ICD-10-CM | POA: Diagnosis not present

## 2021-07-08 DIAGNOSIS — I110 Hypertensive heart disease with heart failure: Secondary | ICD-10-CM | POA: Diagnosis not present

## 2021-07-08 DIAGNOSIS — I5032 Chronic diastolic (congestive) heart failure: Secondary | ICD-10-CM | POA: Diagnosis not present

## 2021-07-08 DIAGNOSIS — L97821 Non-pressure chronic ulcer of other part of left lower leg limited to breakdown of skin: Secondary | ICD-10-CM | POA: Diagnosis not present

## 2021-07-08 DIAGNOSIS — G4733 Obstructive sleep apnea (adult) (pediatric): Secondary | ICD-10-CM | POA: Diagnosis not present

## 2021-07-10 DIAGNOSIS — I110 Hypertensive heart disease with heart failure: Secondary | ICD-10-CM | POA: Diagnosis not present

## 2021-07-10 DIAGNOSIS — L97821 Non-pressure chronic ulcer of other part of left lower leg limited to breakdown of skin: Secondary | ICD-10-CM | POA: Diagnosis not present

## 2021-07-10 DIAGNOSIS — G4733 Obstructive sleep apnea (adult) (pediatric): Secondary | ICD-10-CM | POA: Diagnosis not present

## 2021-07-10 DIAGNOSIS — I5032 Chronic diastolic (congestive) heart failure: Secondary | ICD-10-CM | POA: Diagnosis not present

## 2021-07-10 DIAGNOSIS — I87333 Chronic venous hypertension (idiopathic) with ulcer and inflammation of bilateral lower extremity: Secondary | ICD-10-CM | POA: Diagnosis not present

## 2021-07-10 DIAGNOSIS — L97811 Non-pressure chronic ulcer of other part of right lower leg limited to breakdown of skin: Secondary | ICD-10-CM | POA: Diagnosis not present

## 2021-07-11 DIAGNOSIS — I5032 Chronic diastolic (congestive) heart failure: Secondary | ICD-10-CM | POA: Diagnosis not present

## 2021-07-11 DIAGNOSIS — I87332 Chronic venous hypertension (idiopathic) with ulcer and inflammation of left lower extremity: Secondary | ICD-10-CM | POA: Diagnosis not present

## 2021-07-11 DIAGNOSIS — I87321 Chronic venous hypertension (idiopathic) with inflammation of right lower extremity: Secondary | ICD-10-CM | POA: Diagnosis not present

## 2021-07-11 DIAGNOSIS — Z7902 Long term (current) use of antithrombotics/antiplatelets: Secondary | ICD-10-CM | POA: Diagnosis not present

## 2021-07-11 DIAGNOSIS — L97821 Non-pressure chronic ulcer of other part of left lower leg limited to breakdown of skin: Secondary | ICD-10-CM | POA: Diagnosis not present

## 2021-07-11 DIAGNOSIS — I110 Hypertensive heart disease with heart failure: Secondary | ICD-10-CM | POA: Diagnosis not present

## 2021-07-11 DIAGNOSIS — G4733 Obstructive sleep apnea (adult) (pediatric): Secondary | ICD-10-CM | POA: Diagnosis not present

## 2021-07-11 DIAGNOSIS — Z791 Long term (current) use of non-steroidal anti-inflammatories (NSAID): Secondary | ICD-10-CM | POA: Diagnosis not present

## 2021-07-11 DIAGNOSIS — Z7982 Long term (current) use of aspirin: Secondary | ICD-10-CM | POA: Diagnosis not present

## 2021-07-11 DIAGNOSIS — E669 Obesity, unspecified: Secondary | ICD-10-CM | POA: Diagnosis not present

## 2021-07-11 DIAGNOSIS — Z4800 Encounter for change or removal of nonsurgical wound dressing: Secondary | ICD-10-CM | POA: Diagnosis not present

## 2021-07-11 DIAGNOSIS — Z8673 Personal history of transient ischemic attack (TIA), and cerebral infarction without residual deficits: Secondary | ICD-10-CM | POA: Diagnosis not present

## 2021-07-11 DIAGNOSIS — Z9981 Dependence on supplemental oxygen: Secondary | ICD-10-CM | POA: Diagnosis not present

## 2021-07-12 ENCOUNTER — Encounter (HOSPITAL_BASED_OUTPATIENT_CLINIC_OR_DEPARTMENT_OTHER): Payer: Medicare Other | Admitting: Internal Medicine

## 2021-07-12 ENCOUNTER — Other Ambulatory Visit: Payer: Self-pay

## 2021-07-12 DIAGNOSIS — I87333 Chronic venous hypertension (idiopathic) with ulcer and inflammation of bilateral lower extremity: Secondary | ICD-10-CM | POA: Diagnosis not present

## 2021-07-12 DIAGNOSIS — L97821 Non-pressure chronic ulcer of other part of left lower leg limited to breakdown of skin: Secondary | ICD-10-CM | POA: Diagnosis not present

## 2021-07-12 DIAGNOSIS — I890 Lymphedema, not elsewhere classified: Secondary | ICD-10-CM | POA: Diagnosis not present

## 2021-07-12 DIAGNOSIS — Z6841 Body Mass Index (BMI) 40.0 and over, adult: Secondary | ICD-10-CM | POA: Diagnosis not present

## 2021-07-12 DIAGNOSIS — L97811 Non-pressure chronic ulcer of other part of right lower leg limited to breakdown of skin: Secondary | ICD-10-CM | POA: Diagnosis not present

## 2021-07-15 DIAGNOSIS — I110 Hypertensive heart disease with heart failure: Secondary | ICD-10-CM | POA: Diagnosis not present

## 2021-07-15 DIAGNOSIS — I5032 Chronic diastolic (congestive) heart failure: Secondary | ICD-10-CM | POA: Diagnosis not present

## 2021-07-15 DIAGNOSIS — I87332 Chronic venous hypertension (idiopathic) with ulcer and inflammation of left lower extremity: Secondary | ICD-10-CM | POA: Diagnosis not present

## 2021-07-15 DIAGNOSIS — I87321 Chronic venous hypertension (idiopathic) with inflammation of right lower extremity: Secondary | ICD-10-CM | POA: Diagnosis not present

## 2021-07-15 DIAGNOSIS — L97821 Non-pressure chronic ulcer of other part of left lower leg limited to breakdown of skin: Secondary | ICD-10-CM | POA: Diagnosis not present

## 2021-07-15 DIAGNOSIS — Z4800 Encounter for change or removal of nonsurgical wound dressing: Secondary | ICD-10-CM | POA: Diagnosis not present

## 2021-07-19 DIAGNOSIS — Z4800 Encounter for change or removal of nonsurgical wound dressing: Secondary | ICD-10-CM | POA: Diagnosis not present

## 2021-07-19 DIAGNOSIS — L97821 Non-pressure chronic ulcer of other part of left lower leg limited to breakdown of skin: Secondary | ICD-10-CM | POA: Diagnosis not present

## 2021-07-19 DIAGNOSIS — I110 Hypertensive heart disease with heart failure: Secondary | ICD-10-CM | POA: Diagnosis not present

## 2021-07-19 DIAGNOSIS — I87332 Chronic venous hypertension (idiopathic) with ulcer and inflammation of left lower extremity: Secondary | ICD-10-CM | POA: Diagnosis not present

## 2021-07-19 DIAGNOSIS — I87321 Chronic venous hypertension (idiopathic) with inflammation of right lower extremity: Secondary | ICD-10-CM | POA: Diagnosis not present

## 2021-07-19 DIAGNOSIS — I5032 Chronic diastolic (congestive) heart failure: Secondary | ICD-10-CM | POA: Diagnosis not present

## 2021-07-22 DIAGNOSIS — I110 Hypertensive heart disease with heart failure: Secondary | ICD-10-CM | POA: Diagnosis not present

## 2021-07-22 DIAGNOSIS — I87332 Chronic venous hypertension (idiopathic) with ulcer and inflammation of left lower extremity: Secondary | ICD-10-CM | POA: Diagnosis not present

## 2021-07-22 DIAGNOSIS — Z4800 Encounter for change or removal of nonsurgical wound dressing: Secondary | ICD-10-CM | POA: Diagnosis not present

## 2021-07-22 DIAGNOSIS — I87321 Chronic venous hypertension (idiopathic) with inflammation of right lower extremity: Secondary | ICD-10-CM | POA: Diagnosis not present

## 2021-07-22 DIAGNOSIS — L97821 Non-pressure chronic ulcer of other part of left lower leg limited to breakdown of skin: Secondary | ICD-10-CM | POA: Diagnosis not present

## 2021-07-22 DIAGNOSIS — I5032 Chronic diastolic (congestive) heart failure: Secondary | ICD-10-CM | POA: Diagnosis not present

## 2021-07-26 DIAGNOSIS — I5032 Chronic diastolic (congestive) heart failure: Secondary | ICD-10-CM | POA: Diagnosis not present

## 2021-07-26 DIAGNOSIS — I87321 Chronic venous hypertension (idiopathic) with inflammation of right lower extremity: Secondary | ICD-10-CM | POA: Diagnosis not present

## 2021-07-26 DIAGNOSIS — I110 Hypertensive heart disease with heart failure: Secondary | ICD-10-CM | POA: Diagnosis not present

## 2021-07-26 DIAGNOSIS — L97821 Non-pressure chronic ulcer of other part of left lower leg limited to breakdown of skin: Secondary | ICD-10-CM | POA: Diagnosis not present

## 2021-07-26 DIAGNOSIS — Z4800 Encounter for change or removal of nonsurgical wound dressing: Secondary | ICD-10-CM | POA: Diagnosis not present

## 2021-07-26 DIAGNOSIS — I87332 Chronic venous hypertension (idiopathic) with ulcer and inflammation of left lower extremity: Secondary | ICD-10-CM | POA: Diagnosis not present

## 2021-07-29 DIAGNOSIS — L97821 Non-pressure chronic ulcer of other part of left lower leg limited to breakdown of skin: Secondary | ICD-10-CM | POA: Diagnosis not present

## 2021-07-29 DIAGNOSIS — I110 Hypertensive heart disease with heart failure: Secondary | ICD-10-CM | POA: Diagnosis not present

## 2021-07-29 DIAGNOSIS — Z4800 Encounter for change or removal of nonsurgical wound dressing: Secondary | ICD-10-CM | POA: Diagnosis not present

## 2021-07-29 DIAGNOSIS — I87332 Chronic venous hypertension (idiopathic) with ulcer and inflammation of left lower extremity: Secondary | ICD-10-CM | POA: Diagnosis not present

## 2021-07-29 DIAGNOSIS — I87321 Chronic venous hypertension (idiopathic) with inflammation of right lower extremity: Secondary | ICD-10-CM | POA: Diagnosis not present

## 2021-07-29 DIAGNOSIS — I5032 Chronic diastolic (congestive) heart failure: Secondary | ICD-10-CM | POA: Diagnosis not present

## 2021-08-02 DIAGNOSIS — L97821 Non-pressure chronic ulcer of other part of left lower leg limited to breakdown of skin: Secondary | ICD-10-CM | POA: Diagnosis not present

## 2021-08-02 DIAGNOSIS — I87321 Chronic venous hypertension (idiopathic) with inflammation of right lower extremity: Secondary | ICD-10-CM | POA: Diagnosis not present

## 2021-08-02 DIAGNOSIS — I110 Hypertensive heart disease with heart failure: Secondary | ICD-10-CM | POA: Diagnosis not present

## 2021-08-02 DIAGNOSIS — Z4800 Encounter for change or removal of nonsurgical wound dressing: Secondary | ICD-10-CM | POA: Diagnosis not present

## 2021-08-02 DIAGNOSIS — I5032 Chronic diastolic (congestive) heart failure: Secondary | ICD-10-CM | POA: Diagnosis not present

## 2021-08-02 DIAGNOSIS — I87332 Chronic venous hypertension (idiopathic) with ulcer and inflammation of left lower extremity: Secondary | ICD-10-CM | POA: Diagnosis not present

## 2021-08-05 DIAGNOSIS — E7801 Familial hypercholesterolemia: Secondary | ICD-10-CM | POA: Diagnosis not present

## 2021-08-05 DIAGNOSIS — E7800 Pure hypercholesterolemia, unspecified: Secondary | ICD-10-CM | POA: Diagnosis not present

## 2021-08-05 DIAGNOSIS — L97821 Non-pressure chronic ulcer of other part of left lower leg limited to breakdown of skin: Secondary | ICD-10-CM | POA: Diagnosis not present

## 2021-08-05 DIAGNOSIS — I110 Hypertensive heart disease with heart failure: Secondary | ICD-10-CM | POA: Diagnosis not present

## 2021-08-05 DIAGNOSIS — I87332 Chronic venous hypertension (idiopathic) with ulcer and inflammation of left lower extremity: Secondary | ICD-10-CM | POA: Diagnosis not present

## 2021-08-05 DIAGNOSIS — Z4800 Encounter for change or removal of nonsurgical wound dressing: Secondary | ICD-10-CM | POA: Diagnosis not present

## 2021-08-05 DIAGNOSIS — I87321 Chronic venous hypertension (idiopathic) with inflammation of right lower extremity: Secondary | ICD-10-CM | POA: Diagnosis not present

## 2021-08-05 DIAGNOSIS — E1121 Type 2 diabetes mellitus with diabetic nephropathy: Secondary | ICD-10-CM | POA: Diagnosis not present

## 2021-08-05 DIAGNOSIS — I5032 Chronic diastolic (congestive) heart failure: Secondary | ICD-10-CM | POA: Diagnosis not present

## 2021-08-05 DIAGNOSIS — I10 Essential (primary) hypertension: Secondary | ICD-10-CM | POA: Diagnosis not present

## 2021-08-08 DIAGNOSIS — Z4800 Encounter for change or removal of nonsurgical wound dressing: Secondary | ICD-10-CM | POA: Diagnosis not present

## 2021-08-08 DIAGNOSIS — I5032 Chronic diastolic (congestive) heart failure: Secondary | ICD-10-CM | POA: Diagnosis not present

## 2021-08-08 DIAGNOSIS — I87321 Chronic venous hypertension (idiopathic) with inflammation of right lower extremity: Secondary | ICD-10-CM | POA: Diagnosis not present

## 2021-08-08 DIAGNOSIS — I110 Hypertensive heart disease with heart failure: Secondary | ICD-10-CM | POA: Diagnosis not present

## 2021-08-08 DIAGNOSIS — L97821 Non-pressure chronic ulcer of other part of left lower leg limited to breakdown of skin: Secondary | ICD-10-CM | POA: Diagnosis not present

## 2021-08-08 DIAGNOSIS — I87332 Chronic venous hypertension (idiopathic) with ulcer and inflammation of left lower extremity: Secondary | ICD-10-CM | POA: Diagnosis not present

## 2021-08-09 DIAGNOSIS — R296 Repeated falls: Secondary | ICD-10-CM | POA: Diagnosis not present

## 2021-08-09 DIAGNOSIS — Z23 Encounter for immunization: Secondary | ICD-10-CM | POA: Diagnosis not present

## 2021-08-09 DIAGNOSIS — E1129 Type 2 diabetes mellitus with other diabetic kidney complication: Secondary | ICD-10-CM | POA: Diagnosis not present

## 2021-08-09 DIAGNOSIS — E1121 Type 2 diabetes mellitus with diabetic nephropathy: Secondary | ICD-10-CM | POA: Diagnosis not present

## 2021-08-09 DIAGNOSIS — Z8673 Personal history of transient ischemic attack (TIA), and cerebral infarction without residual deficits: Secondary | ICD-10-CM | POA: Diagnosis not present

## 2021-08-09 DIAGNOSIS — Z7189 Other specified counseling: Secondary | ICD-10-CM | POA: Diagnosis not present

## 2021-08-09 DIAGNOSIS — E7801 Familial hypercholesterolemia: Secondary | ICD-10-CM | POA: Diagnosis not present

## 2021-08-09 DIAGNOSIS — I10 Essential (primary) hypertension: Secondary | ICD-10-CM | POA: Diagnosis not present

## 2021-08-10 DIAGNOSIS — G4733 Obstructive sleep apnea (adult) (pediatric): Secondary | ICD-10-CM | POA: Diagnosis not present

## 2021-08-10 DIAGNOSIS — I110 Hypertensive heart disease with heart failure: Secondary | ICD-10-CM | POA: Diagnosis not present

## 2021-08-10 DIAGNOSIS — Z8673 Personal history of transient ischemic attack (TIA), and cerebral infarction without residual deficits: Secondary | ICD-10-CM | POA: Diagnosis not present

## 2021-08-10 DIAGNOSIS — I5032 Chronic diastolic (congestive) heart failure: Secondary | ICD-10-CM | POA: Diagnosis not present

## 2021-08-10 DIAGNOSIS — L97821 Non-pressure chronic ulcer of other part of left lower leg limited to breakdown of skin: Secondary | ICD-10-CM | POA: Diagnosis not present

## 2021-08-10 DIAGNOSIS — E669 Obesity, unspecified: Secondary | ICD-10-CM | POA: Diagnosis not present

## 2021-08-10 DIAGNOSIS — I87321 Chronic venous hypertension (idiopathic) with inflammation of right lower extremity: Secondary | ICD-10-CM | POA: Diagnosis not present

## 2021-08-10 DIAGNOSIS — Z7982 Long term (current) use of aspirin: Secondary | ICD-10-CM | POA: Diagnosis not present

## 2021-08-10 DIAGNOSIS — Z791 Long term (current) use of non-steroidal anti-inflammatories (NSAID): Secondary | ICD-10-CM | POA: Diagnosis not present

## 2021-08-10 DIAGNOSIS — Z7902 Long term (current) use of antithrombotics/antiplatelets: Secondary | ICD-10-CM | POA: Diagnosis not present

## 2021-08-10 DIAGNOSIS — Z9981 Dependence on supplemental oxygen: Secondary | ICD-10-CM | POA: Diagnosis not present

## 2021-08-10 DIAGNOSIS — I87332 Chronic venous hypertension (idiopathic) with ulcer and inflammation of left lower extremity: Secondary | ICD-10-CM | POA: Diagnosis not present

## 2021-08-10 DIAGNOSIS — Z4800 Encounter for change or removal of nonsurgical wound dressing: Secondary | ICD-10-CM | POA: Diagnosis not present

## 2021-08-12 DIAGNOSIS — I87321 Chronic venous hypertension (idiopathic) with inflammation of right lower extremity: Secondary | ICD-10-CM | POA: Diagnosis not present

## 2021-08-12 DIAGNOSIS — I110 Hypertensive heart disease with heart failure: Secondary | ICD-10-CM | POA: Diagnosis not present

## 2021-08-12 DIAGNOSIS — Z4800 Encounter for change or removal of nonsurgical wound dressing: Secondary | ICD-10-CM | POA: Diagnosis not present

## 2021-08-12 DIAGNOSIS — I87332 Chronic venous hypertension (idiopathic) with ulcer and inflammation of left lower extremity: Secondary | ICD-10-CM | POA: Diagnosis not present

## 2021-08-12 DIAGNOSIS — L97821 Non-pressure chronic ulcer of other part of left lower leg limited to breakdown of skin: Secondary | ICD-10-CM | POA: Diagnosis not present

## 2021-08-12 DIAGNOSIS — I5032 Chronic diastolic (congestive) heart failure: Secondary | ICD-10-CM | POA: Diagnosis not present

## 2021-08-16 ENCOUNTER — Other Ambulatory Visit: Payer: Self-pay

## 2021-08-16 ENCOUNTER — Encounter (HOSPITAL_BASED_OUTPATIENT_CLINIC_OR_DEPARTMENT_OTHER): Payer: Medicare Other | Attending: Internal Medicine | Admitting: Internal Medicine

## 2021-08-16 DIAGNOSIS — G4733 Obstructive sleep apnea (adult) (pediatric): Secondary | ICD-10-CM | POA: Insufficient documentation

## 2021-08-16 DIAGNOSIS — Z8673 Personal history of transient ischemic attack (TIA), and cerebral infarction without residual deficits: Secondary | ICD-10-CM | POA: Diagnosis not present

## 2021-08-16 DIAGNOSIS — L97812 Non-pressure chronic ulcer of other part of right lower leg with fat layer exposed: Secondary | ICD-10-CM | POA: Diagnosis not present

## 2021-08-16 DIAGNOSIS — L97822 Non-pressure chronic ulcer of other part of left lower leg with fat layer exposed: Secondary | ICD-10-CM | POA: Insufficient documentation

## 2021-08-16 DIAGNOSIS — Z6841 Body Mass Index (BMI) 40.0 and over, adult: Secondary | ICD-10-CM | POA: Insufficient documentation

## 2021-08-16 DIAGNOSIS — I87333 Chronic venous hypertension (idiopathic) with ulcer and inflammation of bilateral lower extremity: Secondary | ICD-10-CM | POA: Diagnosis not present

## 2021-08-16 DIAGNOSIS — I890 Lymphedema, not elsewhere classified: Secondary | ICD-10-CM | POA: Diagnosis not present

## 2021-08-16 NOTE — Progress Notes (Signed)
[name] [name] ([id number]) , Visit Report for 08/16/2021 Arrival Information Details Patient Name: Date of Service: [name] 08/16/2021 12:30 PM Medical Record Number: [id number] Patient Account Number: [id number] Date of Birth/Sex: Treating RN: [birth year] (65 y.o. [name], Lauren Primary Care Britiny Defrain: [name] Other Clinician: Referring Kashira Behunin: Treating Noemi Ishmael/Extender: [name] in Treatment: 12 Visit Information History Since Last Visit Added or deleted any medications: No Patient Arrived: [name]: No Arrival Time: 12:52 Had a fall or experienced change in No Accompanied By: self activities of daily living that may affect Transfer Assistance: Manual risk of falls: Patient Identification Verified: Yes Signs or symptoms of abuse/neglect since last visito No Secondary Verification Process Completed: Yes Hospitalized since last visit: No Patient Requires Transmission-Based Precautions: No Implantable device outside of the clinic excluding No Patient Has Alerts: Yes cellular tissue based products placed in the center Patient Alerts: R ABI = 1.25 since last visit: L ABI = 1.27 Has Dressing in Place as Prescribed: Yes Has Compression in Place as Prescribed: Yes Pain Present Now: No Electronic Signature(s) Signed: 08/16/2021 5:11:56 PM By: Rhae Hammock RN Entered By: Rhae Hammock on 08/16/2021 12:52:49 -------------------------------------------------------------------------------- Compression Therapy Details Patient Name: Date of Service: [name], HA [place] 08/16/2021 12:30 PM Medical Record Number: [id number] Patient Account Number: [id number] Date of Birth/Sex: Treating RN: [birth year] (65 y.o. [name], Lauren Primary Care Guinevere Stephenson: [name] Other Clinician: Referring Eudelia Hiltunen: Treating Zelig Gacek/Extender: [name] in Treatment:  90 Compression Therapy Performed for Wound Assessment: Wound #65 Left,Distal,Anterior Lower Leg Performed By: Clinician Rhae Hammock, RN Compression Type: Four Layer Post Procedure Diagnosis Same as Pre-procedure Electronic Signature(s) Signed: 08/16/2021 5:11:56 PM By: Rhae Hammock RN Entered By: Rhae Hammock on 08/16/2021 13:18:35 -------------------------------------------------------------------------------- Compression Therapy Details Patient Name: Date of Service: [name] 08/16/2021 12:30 PM Medical Record Number: [id number] Patient Account Number: [id number] Date of Birth/Sex: Treating RN: 11-28-1955 (65 y.o. [name], Lauren Primary Care Kerem Gilmer: [name] Other Clinician: Referring Laster Appling: Treating Cayleigh Paull/Extender: [name] in Treatment: 90 Compression Therapy Performed for Wound Assessment: Wound #80 Left,Circumferential Lower Leg Performed By: Clinician Rhae Hammock, RN Compression Type: Four Layer Post Procedure Diagnosis Same as Pre-procedure Electronic Signature(s) Signed: 08/16/2021 5:11:56 PM By: Rhae Hammock RN Entered By: Rhae Hammock on 08/16/2021 13:18:35 -------------------------------------------------------------------------------- Compression Therapy Details Patient Name: Date of Service: [name] 08/16/2021 12:30 PM Medical Record Number: [id number] Patient Account Number: [id number] Date of Birth/Sex: Treating RN: May 24, 1956 (65 y.o. [name], Lauren Primary Care Hortencia Martire: [name] Other Clinician: Referring Lashanna Angelo: Treating Coreon Simkins/Extender: [name] in Treatment: 90 Compression Therapy Performed for Wound Assessment: Wound #81 Right,Circumferential Lower Leg Performed By: Clinician Rhae Hammock, RN Compression Type: Four Layer Post Procedure Diagnosis Same as Pre-procedure Electronic Signature(s) Signed:  08/16/2021 5:11:56 PM By: Rhae Hammock RN Entered By: Rhae Hammock on 08/16/2021 13:18:35 -------------------------------------------------------------------------------- Encounter Discharge Information Details Patient Name: Date of Service: [name], [name] 08/16/2021 12:30 PM Medical Record Number: [id number] Patient Account Number: [id number] Date of Birth/Sex: Treating RN: [birth year] (65 y.o. [name], Lauren Primary Care Abran Gavigan: [name] Other Clinician: Referring Kenrick Pore: Treating Stormie Ventola/Extender: [name] in Treatment: 65 Encounter Discharge Information Items Discharge Condition: Stable Ambulatory Status: Cane Discharge Destination: Home Transportation: Private Auto Accompanied By: self Schedule Follow-up Appointment: Yes Clinical Summary of Care: Patient Declined Electronic Signature(s) Signed: 08/16/2021 5:11:56 PM By: Hollie Salk,  Lauren RN Entered By: Rhae Hammock on 08/16/2021 13:23:08 -------------------------------------------------------------------------------- Lower Extremity Assessment Details Patient Name: Date of Service: [name] 08/16/2021 12:30 PM Medical Record Number: [id number] Patient Account Number: [id number] Date of Birth/Sex: Treating RN: 05-13-1956 (65 y.o. [name], Lauren Primary Care Claryce Friel: [name] Other Clinician: Referring Lexianna Weinrich: Treating Lynix Bonine/Extender: [name] in Treatment: 90 Edema Assessment Assessed: [name]: Yes] [name]: Yes] Edema: [Left: Yes] [Right: Yes] Calf Left: Right: Point of Measurement: 30 cm From Medial Instep 43 cm 43.5 cm Ankle Left: Right: Point of Measurement: 13 cm From Medial Instep 26 cm 27.8 cm Vascular Assessment Pulses: Dorsalis Pedis Palpable: [Left:Yes] [Right:Yes] Posterior Tibial Palpable: [Left:Yes] [Right:Yes] Electronic Signature(s) Signed: 08/16/2021 5:11:56 PM By: Rhae Hammock  RN Entered By: Rhae Hammock on 08/16/2021 12:53:28 -------------------------------------------------------------------------------- Multi Wound Chart Details Patient Name: Date of Service: [name], [name] 08/16/2021 12:30 PM Medical Record Number: [id number] Patient Account Number: [id number] Date of Birth/Sex: Treating RN: Aug 12, 1956 (65 y.o. M) Primary Care Sonora Catlin: [name] Other Clinician: Referring Lashawn Orrego: Treating Nandika Stetzer/Extender: [name] in Treatment: 90 Vital Signs Height(in): 70 Pulse(bpm): 68 Weight(lbs): 360 Blood Pressure(mmHg): 155/87 Body Mass Index(BMI): 52 Temperature(F): 97.7 Respiratory Rate(breaths/min): 17 Photos: Left, Distal, Anterior Lower Leg Left, Circumferential Lower Leg Right, Circumferential Lower Leg Wound Location: Gradually Appeared Gradually Appeared Gradually Appeared Wounding Event: Venous Leg Ulcer Venous Leg Ulcer Venous Leg Ulcer Primary Etiology: Lymphedema N/A N/A Secondary Etiology: Lymphedema, Sleep Apnea, Lymphedema, Sleep Apnea, Lymphedema, Sleep Apnea, Comorbid History: Congestive Heart Failure, Congestive Heart Failure, Congestive Heart Failure, Hypertension, Peripheral Venous Hypertension, Peripheral Venous Hypertension, Peripheral Venous Disease, Osteoarthritis Disease, Osteoarthritis Disease, Osteoarthritis 09/08/2019 07/12/2021 07/12/2021 Date Acquired: 90 5 5 Weeks of Treatment: Open Open Open Wound Status: Yes No No Clustered Wound: 1 N/A N/A Clustered Quantity: 0.5x1x1.5 1x1x0.1 1x1x0.1 Measurements L x W x D (cm) 0.393 0.785 0.785 A (cm) : rea 0.589 0.079 0.079 Volume (cm) : 99.90% 99.60% 99.80% % Reduction in A rea: 99.00% 99.60% 99.80% % Reduction in Volume: 12 Starting Position 1 (o'clock): 12 Ending Position 1 (o'clock): 1.5 Maximum Distance 1 (cm): Yes No No Undermining: Full Thickness Without Exposed Full Thickness Without Exposed Full  Thickness Without Exposed Classification: Support Structures Support Structures Support Structures Medium Medium Medium Exudate Amount: Purulent Serosanguineous Serosanguineous Exudate Type: yellow, brown, green red, brown red, brown Exudate Color: Distinct, outline attached Distinct, outline attached Distinct, outline attached Wound Margin: Large (67-100%) Large (67-100%) Large (67-100%) Granulation Amount: Pink, Friable Red Red, Pink Granulation Quality: None Present (0%) None Present (0%) None Present (0%) Necrotic Amount: Fat Layer (Subcutaneous Tissue): Yes Fat Layer (Subcutaneous Tissue): Yes Fat Layer (Subcutaneous Tissue): Yes Exposed Structures: Fascia: No Fascia: No Fascia: No Tendon: No Tendon: No Tendon: No Muscle: No Muscle: No Muscle: No Joint: No Joint: No Joint: No Bone: No Bone: No Bone: No Large (67-100%) None None Epithelialization: Compression Therapy Compression Therapy Compression Therapy Procedures Performed: Treatment Notes Wound #65 (Lower Leg) Wound Laterality: Left, Anterior, Distal Cleanser Soap and Water Discharge Instruction: May shower and wash wound with dial antibacterial soap and water prior to dressing change. Normal Saline Discharge Instruction: Cleanse the wound with Normal Saline prior to applying a clean dressing using gauze sponges, not tissue or cotton balls. Peri-Wound Care Triamcinolone 15 (g) Discharge Instruction: Use triamcinolone 15 (g) as directed Sween Lotion (Moisturizing lotion) Discharge Instruction: Apply moisturizing lotion as directed Topical Primary Dressing KerraCel Ag Gelling Fiber Dressing, 4x5 in (silver alginate) Discharge Instruction:  Apply silver alginate to wound bed as instructed Secondary Dressing Woven Gauze Sponge, Non-Sterile 4x4 in Discharge Instruction: Apply over primary dressing as directed. ABD Pad, 5x9 Discharge Instruction: Apply over primary dressing as directed. Secured  With Compression Wrap FourPress (4 layer compression wrap) Discharge Instruction: Apply four layer compression as directed. Compression Stockings Add-Ons Wound #80 (Lower Leg) Wound Laterality: Left, Circumferential Cleanser Soap and Water Discharge Instruction: May shower and wash wound with dial antibacterial soap and water prior to dressing change. Normal Saline Discharge Instruction: Cleanse the wound with Normal Saline prior to applying a clean dressing using gauze sponges, not tissue or cotton balls. Peri-Wound Care Triamcinolone 15 (g) Discharge Instruction: Use triamcinolone 15 (g) as directed Sween Lotion (Moisturizing lotion) Discharge Instruction: Apply moisturizing lotion as directed Topical Primary Dressing KerraCel Ag Gelling Fiber Dressing, 4x5 in (silver alginate) Discharge Instruction: Apply silver alginate to wound bed as instructed Secondary Dressing Woven Gauze Sponge, Non-Sterile 4x4 in Discharge Instruction: Apply over primary dressing as directed. ABD Pad, 5x9 Discharge Instruction: Apply over primary dressing as directed. Secured With Compression Wrap FourPress (4 layer compression wrap) Discharge Instruction: Apply four layer compression as directed. Compression Stockings Add-Ons Wound #81 (Lower Leg) Wound Laterality: Right, Circumferential Cleanser Soap and Water Discharge Instruction: May shower and wash wound with dial antibacterial soap and water prior to dressing change. Normal Saline Discharge Instruction: Cleanse the wound with Normal Saline prior to applying a clean dressing using gauze sponges, not tissue or cotton balls. Peri-Wound Care Triamcinolone 15 (g) Discharge Instruction: Use triamcinolone 15 (g) as directed Sween Lotion (Moisturizing lotion) Discharge Instruction: Apply moisturizing lotion as directed Topical Primary Dressing KerraCel Ag Gelling Fiber Dressing, 4x5 in (silver alginate) Discharge Instruction: Apply silver  alginate to wound bed as instructed Secondary Dressing Woven Gauze Sponge, Non-Sterile 4x4 in Discharge Instruction: Apply over primary dressing as directed. ABD Pad, 5x9 Discharge Instruction: Apply over primary dressing as directed. Secured With Compression Wrap FourPress (4 layer compression wrap) Discharge Instruction: Apply four layer compression as directed. Compression Stockings Add-Ons Electronic Signature(s) Signed: 08/16/2021 5:06:47 PM By: Linton Ham MD Entered By: Linton Ham on 08/16/2021 13:33:12 -------------------------------------------------------------------------------- Multi-Disciplinary Care Plan Details Patient Name: Date of Service: [name], [name] 08/16/2021 12:30 PM Medical Record Number: [id number] Patient Account Number: [id number] Date of Birth/Sex: Treating RN: September 10, 1956 (65 y.o. [name], Lauren Primary Care Rolfe Hartsell: [name] Other Clinician: Referring Antwoin Lackey: Treating Byard Carranza/Extender: [name] in Treatment: 73 Active Inactive Venous Leg Ulcer Nursing Diagnoses: Knowledge deficit related to disease process and management Potential for venous Insuffiency (use before diagnosis confirmed) Goals: Patient will maintain optimal edema control Date Initiated: 01/11/2021 Target Resolution Date: 08/20/2021 Goal Status: Active Interventions: Assess peripheral edema status every visit. Compression as ordered Treatment Activities: Therapeutic compression applied : 01/11/2021 Notes: 06/14/21: Edema control ongoing. Wound/Skin Impairment Nursing Diagnoses: Knowledge deficit related to ulceration/compromised skin integrity Goals: Patient/caregiver will verbalize understanding of skin care regimen Date Initiated: 11/25/2019 Target Resolution Date: 08/20/2021 Goal Status: Active Ulcer/skin breakdown will have a volume reduction of 30% by week 4 Date Initiated: 11/25/2019 Date Inactivated: 01/06/2020 Target  Resolution Date: 12/26/2019 Goal Status: Met Ulcer/skin breakdown will have a volume reduction of 50% by week 8 Date Initiated: 01/06/2020 Date Inactivated: 02/17/2020 Target Resolution Date: 02/05/2020 Goal Status: Met Interventions: Assess patient/caregiver ability to obtain necessary supplies Assess patient/caregiver ability to perform ulcer/skin care regimen upon admission and as needed Assess ulceration(s) every visit Notes: 06/14/21: Wound care regimen continues Electronic Signature(s) Signed: 08/16/2021  5:11:56 PM By: Rhae Hammock RN Entered By: Rhae Hammock on 08/16/2021 13:17:27 -------------------------------------------------------------------------------- Pain Assessment Details Patient Name: Date of Service: [name], Lamonte Sakai RDIN W. 08/16/2021 12:30 PM Medical Record Number: [id number] Patient Account Number: [id number] Date of Birth/Sex: Treating RN: 06-Aug-1956 (65 y.o. [name], Lauren Primary Care Ashaunti Treptow: [name] Other Clinician: Referring Jaylynn Siefert: Treating Kewon Statler/Extender: [name] in Treatment: 90 Active Problems Location of Pain Severity and Description of Pain Patient Has Paino No Site Locations Pain Management and Medication Current Pain Management: Electronic Signature(s) Signed: 08/16/2021 5:11:56 PM By: Rhae Hammock RN Entered By: Rhae Hammock on 08/16/2021 12:53:16 -------------------------------------------------------------------------------- Patient/Caregiver Education Details Patient Name: Date of Service: [name] 11/29/2022andnbsp12:30 PM Medical Record Number: [id number] Patient Account Number: [id number] Date of Birth/Gender: Treating RN: 1956/04/07 (65 y.o. [name] Primary Care Physician: [name] Other Clinician: Referring Physician: Treating Physician/Extender: [name] in Treatment: 90 Education Assessment Education Provided  To: Patient Education Topics Provided Wound/Skin Impairment: Methods: Explain/Verbal Responses: State content correctly [employer]) Signed: 08/16/2021 5:11:56 PM By: Rhae Hammock RN Entered By: Rhae Hammock on 08/16/2021 13:17:46 -------------------------------------------------------------------------------- Wound Assessment Details Patient Name: Date of Service: [name], [name] 08/16/2021 12:30 PM Medical Record Number: [id number] Patient Account Number: [id number] Date of Birth/Sex: Treating RN: [birth year] (65 y.o. [name], Lauren Primary Care Sedrick Tober: [name] Other Clinician: Referring Trystian Crisanto: Treating Margie Urbanowicz/Extender: [name] in Treatment: 90 Wound Status Wound Number: 65 Primary Venous Leg Ulcer Etiology: Wound Location: Left, Distal, Anterior Lower Leg Secondary Lymphedema Wounding Event: Gradually Appeared Etiology: Date Acquired: 09/08/2019 Wound Open Weeks Of Treatment: 90 Status: Clustered Wound: Yes Comorbid Lymphedema, Sleep Apnea, Congestive Heart Failure, History: Hypertension, Peripheral Venous Disease, Osteoarthritis Photos Wound Measurements Length: (cm) 0.5 Width: (cm) 1 Depth: (cm) 1.5 Clustered Quantity: 1 Area: (cm) 0.393 Volume: (cm) 0.589 % Reduction in Area: 99.9% % Reduction in Volume: 99% Epithelialization: Large (67-100%) Tunneling: No Undermining: Yes Starting Position (o'clock): 12 Ending Position (o'clock): 12 Maximum Distance: (cm) 1.5 Wound Description Classification: Full Thickness Without Exposed Support Structures Wound Margin: Distinct, outline attached Exudate Amount: Medium Exudate Type: Purulent Exudate Color: yellow, brown, green Foul Odor After Cleansing: No Slough/Fibrino No Wound Bed Granulation Amount: Large (67-100%) Exposed Structure Granulation Quality: Pink, Friable Fascia Exposed: No Necrotic Amount: None Present (0%) Fat Layer  (Subcutaneous Tissue) Exposed: Yes Tendon Exposed: No Muscle Exposed: No Joint Exposed: No Bone Exposed: No Treatment Notes Wound #65 (Lower Leg) Wound Laterality: Left, Anterior, Distal Cleanser Soap and Water Discharge Instruction: May shower and wash wound with dial antibacterial soap and water prior to dressing change. Normal Saline Discharge Instruction: Cleanse the wound with Normal Saline prior to applying a clean dressing using gauze sponges, not tissue or cotton balls. Peri-Wound Care Triamcinolone 15 (g) Discharge Instruction: Use triamcinolone 15 (g) as directed Sween Lotion (Moisturizing lotion) Discharge Instruction: Apply moisturizing lotion as directed Topical Primary Dressing KerraCel Ag Gelling Fiber Dressing, 4x5 in (silver alginate) Discharge Instruction: Apply silver alginate to wound bed as instructed Secondary Dressing Woven Gauze Sponge, Non-Sterile 4x4 in Discharge Instruction: Apply over primary dressing as directed. ABD Pad, 5x9 Discharge Instruction: Apply over primary dressing as directed. Secured With Compression Wrap FourPress (4 layer compression wrap) Discharge Instruction: Apply four layer compression as directed. Compression Stockings Add-Ons Electronic Signature(s) Signed: 08/16/2021 5:11:56 PM By: Rhae Hammock RN Entered By: Rhae Hammock on 08/16/2021 13:08:38 -------------------------------------------------------------------------------- Wound Assessment Details Patient Name: Date of Service: [name],  [name] 08/16/2021 12:30 PM Medical Record Number: [id number] Patient Account Number: [id number] Date of Birth/Sex: Treating RN: [birth year] (65 y.o. [name], Lauren Primary Care Ravinder Hofland: [name] Other Clinician: Referring Faiga Stones: Treating Mystery Schrupp/Extender: [name] in Treatment: 90 Wound Status Wound Number: 80 Primary Venous Leg Ulcer Etiology: Wound Location: Left,  Circumferential Lower Leg Wound Open Wounding Event: Gradually Appeared Status: Date Acquired: 07/12/2021 Comorbid Lymphedema, Sleep Apnea, Congestive Heart Failure, Weeks Of Treatment: 5 History: Hypertension, Peripheral Venous Disease, Osteoarthritis Clustered Wound: No Photos Wound Measurements Length: (cm) 1 Width: (cm) 1 Depth: (cm) 0.1 Area: (cm) 0.785 Volume: (cm) 0.079 % Reduction in Area: 99.6% % Reduction in Volume: 99.6% Epithelialization: None Tunneling: No Undermining: No Wound Description Classification: Full Thickness Without Exposed Support Structures Wound Margin: Distinct, outline attached Exudate Amount: Medium Exudate Type: Serosanguineous Exudate Color: red, brown Foul Odor After Cleansing: No Slough/Fibrino No Wound Bed Granulation Amount: Large (67-100%) Exposed Structure Granulation Quality: Red Fascia Exposed: No Necrotic Amount: None Present (0%) Fat Layer (Subcutaneous Tissue) Exposed: Yes Tendon Exposed: No Muscle Exposed: No Joint Exposed: No Bone Exposed: No Treatment Notes Wound #80 (Lower Leg) Wound Laterality: Left, Circumferential Cleanser Soap and Water Discharge Instruction: May shower and wash wound with dial antibacterial soap and water prior to dressing change. Normal Saline Discharge Instruction: Cleanse the wound with Normal Saline prior to applying a clean dressing using gauze sponges, not tissue or cotton balls. Peri-Wound Care Triamcinolone 15 (g) Discharge Instruction: Use triamcinolone 15 (g) as directed Sween Lotion (Moisturizing lotion) Discharge Instruction: Apply moisturizing lotion as directed Topical Primary Dressing KerraCel Ag Gelling Fiber Dressing, 4x5 in (silver alginate) Discharge Instruction: Apply silver alginate to wound bed as instructed Secondary Dressing Woven Gauze Sponge, Non-Sterile 4x4 in Discharge Instruction: Apply over primary dressing as directed. ABD Pad, 5x9 Discharge Instruction:  Apply over primary dressing as directed. Secured With Compression Wrap FourPress (4 layer compression wrap) Discharge Instruction: Apply four layer compression as directed. Compression Stockings Add-Ons Electronic Signature(s) Signed: 08/16/2021 5:11:56 PM By: Rhae Hammock RN Entered By: Rhae Hammock on 08/16/2021 13:09:11 -------------------------------------------------------------------------------- Wound Assessment Details Patient Name: Date of Service: [name], [name] 08/16/2021 12:30 PM Medical Record Number: [id number] Patient Account Number: [id number] Date of Birth/Sex: Treating RN: 03/14/1956 (65 y.o. [name], Lauren Primary Care Arianah Torgeson: [name] Other Clinician: Referring Jeydan Barner: Treating Jaicob Dia/Extender: [name] in Treatment: 90 Wound Status Wound Number: 81 Primary Venous Leg Ulcer Etiology: Wound Location: Right, Circumferential Lower Leg Wound Open Wounding Event: Gradually Appeared Status: Date Acquired: 07/12/2021 Comorbid Lymphedema, Sleep Apnea, Congestive Heart Failure, Weeks Of Treatment: 5 History: Hypertension, Peripheral Venous Disease, Osteoarthritis Clustered Wound: No Photos Wound Measurements Length: (cm) 1 Width: (cm) 1 Depth: (cm) 0.1 Area: (cm) 0.785 Volume: (cm) 0.079 % Reduction in Area: 99.8% % Reduction in Volume: 99.8% Epithelialization: None Tunneling: No Undermining: No Wound Description Classification: Full Thickness Without Exposed Support Structures Wound Margin: Distinct, outline attached Exudate Amount: Medium Exudate Type: Serosanguineous Exudate Color: red, brown Foul Odor After Cleansing: No Slough/Fibrino No Wound Bed Granulation Amount: Large (67-100%) Exposed Structure Granulation Quality: Red, Pink Fascia Exposed: No Necrotic Amount: None Present (0%) Fat Layer (Subcutaneous Tissue) Exposed: Yes Tendon Exposed: No Muscle Exposed: No Joint Exposed:  No Bone Exposed: No Treatment Notes Wound #81 (Lower Leg) Wound Laterality: Right, Circumferential Cleanser Soap and Water Discharge Instruction: May shower and wash wound with dial antibacterial soap and water prior to dressing change. Normal Saline Discharge Instruction: Cleanse  the wound with Normal Saline prior to applying a clean dressing using gauze sponges, not tissue or cotton balls. Peri-Wound Care Triamcinolone 15 (g) Discharge Instruction: Use triamcinolone 15 (g) as directed Sween Lotion (Moisturizing lotion) Discharge Instruction: Apply moisturizing lotion as directed Topical Primary Dressing KerraCel Ag Gelling Fiber Dressing, 4x5 in (silver alginate) Discharge Instruction: Apply silver alginate to wound bed as instructed Secondary Dressing Woven Gauze Sponge, Non-Sterile 4x4 in Discharge Instruction: Apply over primary dressing as directed. ABD Pad, 5x9 Discharge Instruction: Apply over primary dressing as directed. Secured With Compression Wrap FourPress (4 layer compression wrap) Discharge Instruction: Apply four layer compression as directed. Compression Stockings Add-Ons Electronic Signature(s) Signed: 08/16/2021 5:11:56 PM By: Rhae Hammock RN Entered By: Rhae Hammock on 08/16/2021 13:09:49 -------------------------------------------------------------------------------- Vitals Details Patient Name: Date of Service: [name], [name] 08/16/2021 12:30 PM Medical Record Number: [id number] Patient Account Number: [id number] Date of Birth/Sex: Treating RN: 11/13/1955 (65 y.o. [name], Lauren Primary Care Tyrianna Lightle: [name] Other Clinician: Referring Renarda Mullinix: Treating Djuan Talton/Extender: [name] in Treatment: 90 Vital Signs Time Taken: 12:52 Temperature (F): 97.7 Height (in): 70 Pulse (bpm): 74 Weight (lbs): 360 Respiratory Rate (breaths/min): 17 Body Mass Index (BMI): 51.6 Blood Pressure (mmHg):  155/87 Reference Range: 80 - 120 mg / dl Electronic Signature(s) Signed: 08/16/2021 5:11:56 PM By: Rhae Hammock RN Entered By: Rhae Hammock on 08/16/2021 12:53:09

## 2021-08-16 NOTE — Progress Notes (Signed)
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 medial ankle. 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 Not surprisingly comes back in with wounds on his bilateral legs at the level of where his external  compression garment was tight superiorly. He tells me he is using his pumps once every second day. 7/20; 2-week follow-up the patient has not been using his compression pumps his wife was transferred to a new nursing home. He has 1 wound on the medial left lower leg and a small area on the right lateral 8/17; patient arrives today with only a smattering of small wounds on the left anterior lower leg most of these are eschared and look benign. There is nothing on his right leg. We have been using silver alginate under 4-layer compression 8/31; patient's wounds on the left anterior lower leg are larger. There is still nothing open on the right toe although we did put compression wraps on this last time. He has home health. Says he is used his compression pumps twice in the last 3 days. Obviously not sufficient 9/14; 2-week follow-up. We discharged him in his own zippered stocking. Apparently home health helped him change this and noted weeping edema fluid therefore put him back in a wrap he arrives in the  clinic today with no open wounds on the right innumerable small broken areas on the anterior left calf. Uncontrolled edema above the level of the wrap compatible with lymphedema 9/27 2-week follow-up. He arrives today with the left anterior lower extremity looking better. On the right he has a small open area posteriorly. I am going to put him back in compression on both sides. He claims compliance with his compression pumps at home twice a day he has home health changing his dressing 10/26; 1 month follow-up he has home health changing his dressings there is no open wound on the right leg he has extremitease stockings and external compression pumps which he says he is using once a day. The left leg is always been a more difficult site and although it is difficult to identify any precise open wound he has several leaking areas especially anteriorly and superiorly and at the edge of his wraps 11/9; 2-week follow-up. He has his extremities stocking on the right. He says he has been using his compression pumps once a day. He has 1 remaining wound on the left anterior mid tibia which looks healthy his edema control is otherwise good on the left 11/30; there is now a 3-week follow-up. We use his own compression stocking on the right last time as he has no open wounds. He apparently developed a UTI received a course of ciprofloxacin. He did not wear his compression pumps and I wonder whether he actually was using his stocking. Home health put a compression wrap on his right leg last week. He has multiple small open areas on the left anterior leg this week. 12/7; still a cluster of small areas on the left lateral calf and the right lateral calf. He has home health changing his dressings. For some reason he will not use compression pumps reliably this week because his wife spent 2 days in the hospital with anemia 12/21; he continues to have a cluster of small areas predominantly on the left lateral but also  the left anterior lower leg. More defined wounds on the right anterior and right medial. The he says he is using the compression pumps every other day. I have never understood the issue for this noncompliant. He still has home health coming out 1/11 the patient continues to have multiple small open areas on the left lateral left anterior and medial lower leg anteriorly. He also has a small punched-out painful area in  the crease of his left ankle which I think is probably a rapid injury. Very tender. I debrided in this area very adherent debris. Also small areas on the right lateral calf. We have been using silver alginate. His compression pump usage is probably marginal 2/8; the patient comes back in with his legs essentially in the same condition. He has multiple small areas on his bilateral lower extremities left greater than right anteriorly and posteriorly. A lot of these have small eschars on top some of them do not. His edema control is marginal. He says he uses his pumps once a day. He has home health changing his dressing 4-layer compression silver alginate as the primary dressing 3/1; Marked deterioration this visit. He has multiple small areas across the entirety of the lateral anterior and medial left lower leg. I am actually concerned that he may be on his way to a more substantive degree of breakdown in this area. He is using his compression pumps 4 times per week. He has home health changing this dressing week 3/29; again not much improvement from last time he is here significant bilateral small wounds for the most part skin breakdown. The larger areas are on the left lateral. Small areas bilaterally. He has poor edema control. Relates that he is not using his compression pumps with any reliability. He states his wife was in the hospital last week. He does not complain of chest pain or shortness of breath 4/26; patient presents for 1 month follow-up. He reports no issues. He states that  he last used his lymphedema pumps several weeks ago. He has been having his legs wrapped bilaterally with home health with 4-layer compression and silver alginate underneath. He has no complaints today. 5/31; patient presents for 1 month follow-up. He tolerates the compression wraps well. He states that home health continues to come out twice weekly T o change theses. He is unable to use his lymphedema pumps due to the fact that he has to take his shoes off. He states he cannot put his shoes on by himself. He denies any issues today. He denies signs of infection. 6/28; patient presents for 1 month follow-up. He has been tolerating compression wraps well. He states he developed a wound to the anterior part of his ankle due to the positioning of the padding placed by the home health nurse rubbing against the area. He states he noticed this getting worse in the last 2 weeks but this has since been corrected. He reports increased soreness to the specific area. Overall he is doing well 8/23; the patient arrives have not been seen here in almost 2 months. He still lives in [place]. He has [facility] home health twice a week that is the only dressing changes he gets. He reports that he wears these surgical sandals but does not feel he can get them on and off enough to use the pumps on a daily basis in fact he is only using them twice a week the rest of the time he leaves this very swollen left greater than right foot in the shoes. He arrives in clinic today with a cluster of small wounds all over the left leg. Very dry chronically inflamed skin in his left lower leg. He is actually better on the right although there are open areas laterally and posteriorly 9/27; the patient comes in for his monthly visit with 2 small open areas on the left and 1 on the right. His edema control is excellent bilaterally and  actually the condition of his skin is quite a bit better 2. He says he occasionally uses his compression  pumps, he is certainly not been compliant in this area . He does say that he has been using more of his Lasix.. This is his closest I am seeing this man to healed in quite a long time. We are still using silver alginate on anything that is open under 4-layer compression bilaterally. We have also been using TCA and Cetaphil to the skin liberally 10/11; patient presents for follow-up. He has no issues or complaints today. He denies signs of infection. 10/25; patient presents with a multitude of small wounds bilaterally mostly in the anterior tibial mid aspect. None of these looks particularly ominous. His edema control is not bad except on his dorsal feet left greater than right. He does not use his compression pumps. He has home health coming by and changing his dressing wraps. [place] me [place]. 11/29; 1 month follow-up. Patient's wound areas look better he has an area on the right lateral calf small areas on the left anterior lower leg look better. The major concern here is an area on the left dorsal ankle. This has considerable depth and it may be down to either bone or calcification I am not sure which. There was no evidence of surrounding infection. Just distal to this severe nonpitting edema Electronic Signature(s) Signed: 08/16/2021 5:06:47 PM By: Linton Ham MD Entered By: Linton Ham on 08/16/2021 13:35:34 -------------------------------------------------------------------------------- Physical Exam Details Patient Name: Date of Service: [name] [name], [name] RDIN W. 08/16/2021 12:30 PM Medical Record Number: [id number] Patient Account Number: [id number] Date of Birth/Sex: Treating RN: October 24, 1955 (65 y.o. M) Primary Care Provider: [name] Other Clinician: Referring Provider: Treating Provider/Extender: [name] in Treatment: 4 Constitutional Patient is hypertensive.. Pulse regular  and within target range for patient.Marland Kitchen Respirations regular, non-labored and within target range.. Temperature is normal and within the target range for the patient.Marland Kitchen Appears in no distress. Notes Wound exam; patient has severe bilateral lymphedema although his edema in both legs looks somewhat better. On the right side he has an area on the right lateral leg which appears superficial and uninfected. He has several small areas on the left leg but in general things look better here. Major issue is on the left dorsal ankle. Probing wound almost a centimeter in depth. I am uncertain whether I am hitting bone or this is underlying calcification but there is considerable depth. No evidence of surrounding infection Electronic Signature(s) Signed: 08/16/2021 5:06:47 PM By: Linton Ham MD Entered By: Linton Ham on 08/16/2021 13:37:54 -------------------------------------------------------------------------------- Physician Orders Details Patient Name: Date of Service: [name] [name], [name] RDIN W. 08/16/2021 12:30 PM Medical Record Number: [id number] Patient Account Number: [id number] Date of Birth/Sex: Treating RN: [birth year] (65 y.o. [name] Primary Care Provider: [name] Other Clinician: Referring Provider: Treating Provider/Extender: [name] in Treatment: 32 Verbal / Phone Orders: No Diagnosis Coding Follow-up Appointments Return appointment in 3 weeks. - Dr. Dellia Nims Bathing/ Shower/ Hygiene May shower with protection but do not get wound dressing(s) wet. Edema Control - Lymphedema / SCD / Other Lymphedema Pumps. Use Lymphedema pumps on leg(s) 2-3 times a day for 45-60 minutes. If wearing any wraps or hose, do not remove them. Continue exercising as instructed. Elevate legs to the level of the heart or above for 30 minutes daily and/or  when sitting, a frequency of: Avoid standing for long periods of time. Exercise regularly Other Edema Control  Orders/Instructions: - Bring compression stockings to next visit Home Health No change in wound care orders this week; continue Home Health for wound care. May utilize formulary equivalent dressing for wound treatment orders unless otherwise specified. Other Home Health Orders/Instructions: - [facility] 2 x a week Wound Treatment Wound #65 - Lower Leg Wound Laterality: Left, Anterior, Distal Cleanser: Soap and Water (Home Health) 2 x Per Week/30 Days Discharge Instructions: May shower and wash wound with dial antibacterial soap and water prior to dressing change. Cleanser: Normal Saline (Home Health) (Generic) 2 x Per Week/30 Days Discharge Instructions: Cleanse the wound with Normal Saline prior to applying a clean dressing using gauze sponges, not tissue or cotton balls. Peri-Wound Care: Triamcinolone 15 (g) 2 x Per Week/30 Days Discharge Instructions: Use triamcinolone 15 (g) as directed Peri-Wound Care: Sween Lotion (Moisturizing lotion) (Home Health) (Generic) 2 x Per Week/30 Days Discharge Instructions: Apply moisturizing lotion as directed Prim Dressing: KerraCel Ag Gelling Fiber Dressing, 4x5 in (silver alginate) (Home Health) (Generic) 2 x Per Week/30 Days ary Discharge Instructions: Apply silver alginate to wound bed as instructed Secondary Dressing: Woven Gauze Sponge, Non-Sterile 4x4 in (Home Health) (Generic) 2 x Per Week/30 Days Discharge Instructions: Apply over primary dressing as directed. Secondary Dressing: ABD Pad, 5x9 (Home Health) 2 x Per Week/30 Days Discharge Instructions: Apply over primary dressing as directed. Compression Wrap: FourPress (4 layer compression wrap) (Home Health) (Generic) 2 x Per Week/30 Days Discharge Instructions: Apply four layer compression as directed. Wound #80 - Lower Leg Wound Laterality: Left, Circumferential Cleanser: Soap and Water (Home Health) 2 x Per Week/30 Days Discharge Instructions: May shower and wash wound with dial antibacterial  soap and water prior to dressing change. Cleanser: Normal Saline (Home Health) (Generic) 2 x Per Week/30 Days Discharge Instructions: Cleanse the wound with Normal Saline prior to applying a clean dressing using gauze sponges, not tissue or cotton balls. Peri-Wound Care: Triamcinolone 15 (g) 2 x Per Week/30 Days Discharge Instructions: Use triamcinolone 15 (g) as directed Peri-Wound Care: Sween Lotion (Moisturizing lotion) (Home Health) (Generic) 2 x Per Week/30 Days Discharge Instructions: Apply moisturizing lotion as directed Prim Dressing: KerraCel Ag Gelling Fiber Dressing, 4x5 in (silver alginate) (Home Health) (Generic) 2 x Per Week/30 Days ary Discharge Instructions: Apply silver alginate to wound bed as instructed Secondary Dressing: Woven Gauze Sponge, Non-Sterile 4x4 in (Home Health) (Generic) 2 x Per Week/30 Days Discharge Instructions: Apply over primary dressing as directed. Secondary Dressing: ABD Pad, 5x9 (Home Health) 2 x Per Week/30 Days Discharge Instructions: Apply over primary dressing as directed. Compression Wrap: FourPress (4 layer compression wrap) (Home Health) (Generic) 2 x Per Week/30 Days Discharge Instructions: Apply four layer compression as directed. Wound #81 - Lower Leg Wound Laterality: Right, Circumferential Cleanser: Soap and Water (Home Health) 2 x Per Week/30 Days Discharge Instructions: May shower and wash wound with dial antibacterial soap and water prior to dressing change. Cleanser: Normal Saline (Home Health) (Generic) 2 x Per Week/30 Days Discharge Instructions: Cleanse the wound with Normal Saline prior to applying a clean dressing using gauze sponges, not tissue or cotton balls. Peri-Wound Care: Triamcinolone 15 (g) 2 x Per Week/30 Days Discharge Instructions: Use triamcinolone 15 (g) as directed Peri-Wound Care: Sween Lotion (Moisturizing lotion) (Home Health) (Generic) 2 x Per Week/30 Days Discharge Instructions: Apply moisturizing lotion as  directed Prim Dressing: KerraCel Ag Gelling Fiber Dressing, 4x5 in (silver  alginate) (Home Health) (Generic) 2 x Per Week/30 Days ary Discharge Instructions: Apply silver alginate to wound bed as instructed Secondary Dressing: Woven Gauze Sponge, Non-Sterile 4x4 in (Home Health) (Generic) 2 x Per Week/30 Days Discharge Instructions: Apply over primary dressing as directed. Secondary Dressing: ABD Pad, 5x9 (Home Health) 2 x Per Week/30 Days Discharge Instructions: Apply over primary dressing as directed. Compression Wrap: FourPress (4 layer compression wrap) (Home Health) (Generic) 2 x Per Week/30 Days Discharge Instructions: Apply four layer compression as directed. Electronic Signature(s) Signed: 08/16/2021 5:06:47 PM By: Linton Ham MD Signed: 08/16/2021 5:11:56 PM By: Rhae Hammock RN Entered By: Rhae Hammock on 08/16/2021 13:19:50 -------------------------------------------------------------------------------- Problem List Details Patient Name: Date of Service: [name] [name], [name] RDIN W. 08/16/2021 12:30 PM Medical Record Number: [id number] Patient Account Number: [id number] Date of Birth/Sex: Treating RN: 10-Apr-1956 (65 y.o. 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 08/16/2021 12:30 PM Medical Record Number: [id number] Patient Account Number: [id number] Date of Birth/Sex: Treating RN: Mar 12, 1956 (65 y.o. M) Primary Care Provider: [name] Other Clinician: Referring Provider: Treating Provider/Extender: [name] in Treatment: 90 Subjective History of Present Illness (HPI) The following HPI elements were documented for the patient's wound: Location: both legs Quality: Patient reports No Pain. Most of thelong history of chronic venous hypertension,chf,morbid obesity. s/p gsv ablation. cva 2oo4. sleep apnea andhbp. breakdown of skin both legs around 2 months ago. treated here for this in 2015. no .dm. 05/09/2016 -- he had his arterial studies done last week and his right ABI was 1.3 and his left ABI was 1.4. His right toe brachial index was 0.98 and on the left was 0.9. Venous studies have only be done today and reports are awaited. 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 Donnetta Hutching. He had a arterial study done which showed a right ABI of 1.3 which is within normal limits at rest and a left ABI of 1.4 which is within normal limits at rest and may be falsely elevated. The right toe brachial index was 0.98 and the left toe brachial index was 0.90 05/30/2016 -- seen by Dr. Althea Charon -- is known to have a prior laser ablation of his left great saphenous vein in 2009. Prior to that he had 2 ablations of the odd same vein by interventional  radiology. As noted to have severe venous hypertension bilaterally. 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 His right great saphenous vein is somewhat dilated but no evidence of reflux. The only deep venous reflux  demonstrated was in his left common femoral vein. After every consideration Dr. Donnetta Hutching recommended reattempt at ablation versus removal of his left great saphenous vein in the operating room with the standard vein stripping technique. The patient would consider this and let him know again. 06/20/16 patient continues to wear a juxtalite on the right leg without any open areas. On the lateral aspect of his left leg he has 4 wounds and a small area on the medial area of the left leg. Using silver alginate under Profore 06/27/16 still no open areas on the right leg. On the lateral aspect of his left leg he has 4 wounds which continue to have a nonviable surface and wheeze been using silver alginate under Profore 07/04/2016 -- patient hasn't yet to contact his vascular surgeon regarding plans for surgical intervention and I believe he is trying his best to avoid surgery. I have again discussed with him the futility of trying to heal this and keep it healed, if he does not agree to surgical intervention 07/11/2016 -- the patient has not had any juxta light ordered for at least 3 years and we will order him a pair. 08/08/2016 -- he has been approved for Apligraf and they will get this ready for him next week 08/15/2016 -- he has his first Apligraf applied today 08/29/2016 -- he has had his second application of Apligraf today 09/12/2016 -- his Apligraf has not arrived today due to the holiday 09/19/2016 -- he has had his third application of Apligraf today 10/03/2016 -- he has had his fourth application of Apligraf today. 10/18/2016 -- his next Apligraf has not arrived today. He has had chronic problems with his back and was to  start on steroids and I have told him there are no objections against this. He is also taking appropriate medications as per his orthopedic doctor. 10/24/2016 -- he is here for his fifth application of Apligraf today. 01/09/2017 -- is been having repeated falls and problems with his back and saw a spine surgeon who has recommended holding his anticoagulation and will have some epidural injections in a few weeks. 03/13/2017 - he had been doing very well with his left lower extremity and the ulcerations that come down significantly. However last week he may have hit himself against a metal cabinet and has started having abrasions and because of this has started weeping from the right lateral calf. He has not used his compression since morning and his right lower extremity has markedly increased and lymphedema 03/27/17; the patient appears to be doing very well only a small cluster of wounds on the right lateral lower extremity. Most of the areas on his left anterior and left posterior leg are closed the wrong way to closing. His compression slipped down today there is irritation where the wrap edge was but no evidence of infection 04/24/2017 -- the patient has been using his lymphedema pumps and is also wearing his new compression on the right lower extremity. 05/01/2017 -- he has begun using his lymphedema pumps for a longer period of time but unfortunately had a fall and may have bruised his left lateral lower extremity under the 4-layer compression wrap and has multiple ulcerations in this area today 06/05/2017 -- after examination today he is noted to have taken a significant turn for the worse with multiple open ulcerations on his left lower calf and anterior leg. Lymphedema is better controlled and there is no evidence of cellulitis. I believe the patient is not being compliant with  his lymphedema pumps. 06/12/2017 -- the patient did not come for his nurse visit change to the left lower extremity on  Friday, as advised. He has not been doing his compression appropriately and now has developed a ulcerated area on the right lower extremity. He also has not been using his lymphedema pumps appropriately. in addition to this the patient tells me that he and his wife are going to the beach this coming Sunday for over a week 06/26/2017 -- the patient is back after 2 weeks when he had gone on vacation and his treatment was substandard and he did not do his lymphedema pump. He does not have any systemic symptoms 08/07/2017 -- he kept his compression stockings all week and says he has been using his compression wraps on the right lower leg. He is also saying he is diligent with his lymphedema pumps. 08/21/17; using his lymphedema pumps about twice a week. He keeps his compression wraps on the left lower leg. He has his compression stocking on the right leg 12/14/18patient continues to be noncompliant with the lymphedema pumps. He has extremitease stockings on the right leg. He has a cluster of wounds on the left leg we have been using silver alginate 09/12/2017 -- over the Christmas holidays his right leg has become extremely large with lymphedema and weeping with ulceration and this is a huge step backward. I understand he has not been compliant with his diet or his lymphedema pumps 09/19/17 on evaluation today patient appears to be doing somewhat poorly due to the significant amount of fluid buildup in the right lower extremity especially. This has been somewhat macerated due to the fact that he is having so much drainage. No fevers, chills, nausea, or vomiting noted at this time. Patient has been tolerating the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 to healed in quite a long time. We are still using silver alginate on anything that is open under 4-layer compression bilaterally. We have also been using TCA and Cetaphil to the skin liberally 10/11; patient presents for follow-up. He has no issues or complaints today. He denies signs of infection. 10/25; patient presents with a multitude of small wounds bilaterally mostly in the anterior tibial mid aspect. None of these looks particularly ominous. His edema control is not bad except on his dorsal feet left greater than right. He does not use his compression pumps. He has home health coming by and changing his dressing wraps. [place]. 11/29; 1 month follow-up. Patient's wound areas look better he has an area on the right lateral calf small areas on the left anterior lower leg look better. The major concern here is an area on the left dorsal ankle. This has considerable depth and it may be down to either bone or calcification I am not sure which. There was no evidence of surrounding infection. Just distal to this severe nonpitting edema Objective Constitutional Patient is hypertensive.. Pulse regular and within target range for patient.Marland Kitchen Respirations regular, non-labored and within target range.. Temperature is normal and within the target range for the patient.Marland Kitchen Appears in no distress. Vitals Time Taken: 12:52 PM, Height: 70 in, Weight: 360 lbs, BMI: 51.6, Temperature: 97.7  F, Pulse: 74 bpm, Respiratory Rate: 17 breaths/min, Blood Pressure: 155/87 mmHg. General Notes: Wound exam; patient has severe bilateral  lymphedema although his edema in both legs looks somewhat better. On the right side he has an area on the right lateral leg which appears superficial and uninfected. He has several small areas on the left leg but in general things look better here. oo Major issue is on the left dorsal ankle. Probing wound almost a centimeter in depth. I am uncertain whether I am hitting bone or this is underlying calcification but there is considerable depth. No evidence of surrounding infection Integumentary (Hair, Skin) Wound #65 status is Open. Original cause of wound was Gradually Appeared. The date acquired was: 09/08/2019. The wound has been in treatment 90 weeks. The wound is located on the [facility] Lower Leg. The wound measures 0.5cm length x 1cm width x 1.5cm depth; 0.393cm^2 area and 0.589cm^3 volume. There is Fat Layer (Subcutaneous Tissue) exposed. There is no tunneling noted, however, there is undermining starting at 12:00 and ending at 12:00 with a maximum distance of 1.5cm. There is a medium amount of purulent drainage noted. The wound margin is distinct with the outline attached to the wound base. There is large (67-100%) pink, friable granulation within the wound bed. There is no necrotic tissue within the wound bed. Wound #80 status is Open. Original cause of wound was Gradually Appeared. The date acquired was: 07/12/2021. The wound has been in treatment 5 weeks. The wound is located on the Left,Circumferential Lower Leg. The wound measures 1cm length x 1cm width x 0.1cm depth; 0.785cm^2 area and 0.079cm^3 volume. There is Fat Layer (Subcutaneous Tissue) exposed. There is no tunneling or undermining noted. There is a medium amount of serosanguineous drainage noted. The wound margin is distinct with the outline attached to the wound base. There is  large (67-100%) red granulation within the wound bed. There is no necrotic tissue within the wound bed. Wound #81 status is Open. Original cause of wound was Gradually Appeared. The date acquired was: 07/12/2021. The wound has been in treatment 5 weeks. The wound is located on the Right,Circumferential Lower Leg. The wound measures 1cm length x 1cm width x 0.1cm depth; 0.785cm^2 area and 0.079cm^3 volume. There is Fat Layer (Subcutaneous Tissue) exposed. There is no tunneling or undermining noted. There is a medium amount of serosanguineous drainage noted. The wound margin is distinct with the outline attached to the wound base. There is large (67-100%) red, pink granulation within the wound bed. There is no necrotic tissue within the wound bed. 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 There was a Four Layer Compression Therapy Procedure by Rhae Hammock, RN. Post procedure Diagnosis Wound #65: Same as Pre-Procedure Wound #80 Pre-procedure diagnosis of Wound #80 is a Venous Leg Ulcer located on the Left,Circumferential Lower Leg . There was a Four Layer Compression Therapy Procedure by Rhae Hammock, RN. Post procedure Diagnosis Wound #80: Same as Pre-Procedure Wound #81 Pre-procedure diagnosis of Wound #81 is a Venous Leg Ulcer located on the Right,Circumferential Lower Leg . There was a Four Layer Compression Therapy Procedure by Rhae Hammock, RN. Post procedure Diagnosis Wound #81: Same as Pre-Procedure Plan Follow-up Appointments: Return  appointment in 3 weeks. - Dr. Dellia Nims Bathing/  Shower/ Hygiene: May shower with protection but do not get wound dressing(s) wet. Edema Control - Lymphedema / SCD / Other: Lymphedema Pumps. Use Lymphedema pumps on leg(s) 2-3 times a day for 45-60 minutes. If wearing any wraps or hose, do not remove them. Continue exercising as instructed. Elevate legs to the level of the heart or above for 30 minutes daily and/or when sitting, a frequency of: Avoid standing for long periods of time. Exercise regularly Other Edema Control Orders/Instructions: - Bring compression stockings to next visit Home Health: No change in wound care orders this week; continue Home Health for wound care. May utilize formulary equivalent dressing for wound treatment orders unless otherwise specified. Other Home Health Orders/Instructions: - [facility] 2 x a week WOUND #65: - Lower Leg Wound Laterality: Left, Anterior, Distal Cleanser: Soap and Water (Home Health) 2 x Per Week/30 Days Discharge Instructions: May shower and wash wound with dial antibacterial soap and water prior to dressing change. Cleanser: Normal Saline (Home Health) (Generic) 2 x Per Week/30 Days Discharge Instructions: Cleanse the wound with Normal Saline prior to applying a clean dressing using gauze sponges, not tissue or cotton balls. Peri-Wound Care: Triamcinolone 15 (g) 2 x Per Week/30 Days Discharge Instructions: Use triamcinolone 15 (g) as directed Peri-Wound Care: Sween Lotion (Moisturizing lotion) (Home Health) (Generic) 2 x Per Week/30 Days Discharge Instructions: Apply moisturizing lotion as directed Prim Dressing: KerraCel Ag Gelling Fiber Dressing, 4x5 in (silver alginate) (Home Health) (Generic) 2 x Per Week/30 Days ary Discharge Instructions: Apply silver alginate to wound bed as instructed Secondary Dressing: Woven Gauze Sponge, Non-Sterile 4x4 in (Home Health) (Generic) 2 x Per Week/30 Days Discharge Instructions: Apply over primary  dressing as directed. Secondary Dressing: ABD Pad, 5x9 (Home Health) 2 x Per Week/30 Days Discharge Instructions: Apply over primary dressing as directed. Com pression Wrap: FourPress (4 layer compression wrap) (Home Health) (Generic) 2 x Per Week/30 Days Discharge Instructions: Apply four layer compression as directed. WOUND #80: - Lower Leg Wound Laterality: Left, Circumferential Cleanser: Soap and Water (Home Health) 2 x Per Week/30 Days Discharge Instructions: May shower and wash wound with dial antibacterial soap and water prior to dressing change. Cleanser: Normal Saline (Home Health) (Generic) 2 x Per Week/30 Days Discharge Instructions: Cleanse the wound with Normal Saline prior to applying a clean dressing using gauze sponges, not tissue or cotton balls. Peri-Wound Care: Triamcinolone 15 (g) 2 x Per Week/30 Days Discharge Instructions: Use triamcinolone 15 (g) as directed Peri-Wound Care: Sween Lotion (Moisturizing lotion) (Home Health) (Generic) 2 x Per Week/30 Days Discharge Instructions: Apply moisturizing lotion as directed Prim Dressing: KerraCel Ag Gelling Fiber Dressing, 4x5 in (silver alginate) (Home Health) (Generic) 2 x Per Week/30 Days ary Discharge Instructions: Apply silver alginate to wound bed as instructed Secondary Dressing: Woven Gauze Sponge, Non-Sterile 4x4 in (Home Health) (Generic) 2 x Per Week/30 Days Discharge Instructions: Apply over primary dressing as directed. Secondary Dressing: ABD Pad, 5x9 (Home Health) 2 x Per Week/30 Days Discharge Instructions: Apply over primary dressing as directed. Com pression Wrap: FourPress (4 layer compression wrap) (Home Health) (Generic) 2 x Per Week/30 Days Discharge Instructions: Apply four layer compression as directed. WOUND #81: - Lower Leg Wound Laterality: Right, Circumferential Cleanser: Soap and Water (Home Health) 2 x Per Week/30 Days Discharge Instructions: May shower and wash wound with dial antibacterial soap  and water prior to dressing change. Cleanser: Normal Saline (Home Health) (Generic) 2 x Per Week/30 Days Discharge Instructions: Cleanse the wound with Normal Saline  prior to applying a clean dressing using gauze sponges, not tissue or cotton balls. Peri-Wound Care: Triamcinolone 15 (g) 2 x Per Week/30 Days Discharge Instructions: Use triamcinolone 15 (g) as directed Peri-Wound Care: Sween Lotion (Moisturizing lotion) (Home Health) (Generic) 2 x Per Week/30 Days Discharge Instructions: Apply moisturizing lotion as directed Prim Dressing: KerraCel Ag Gelling Fiber Dressing, 4x5 in (silver alginate) (Home Health) (Generic) 2 x Per Week/30 Days ary Discharge Instructions: Apply silver alginate to wound bed as instructed Secondary Dressing: Woven Gauze Sponge, Non-Sterile 4x4 in (Home Health) (Generic) 2 x Per Week/30 Days Discharge Instructions: Apply over primary dressing as directed. Secondary Dressing: ABD Pad, 5x9 (Home Health) 2 x Per Week/30 Days Discharge Instructions: Apply over primary dressing as directed. Com pression Wrap: FourPress (4 layer compression wrap) (Home Health) (Generic) 2 x Per Week/30 Days Discharge Instructions: Apply four layer compression as directed. 1. I continued silver alginate including packing into the left dorsal ankle. Still under 4-layer compression 2. I asked him to use his compression pumps especially on the left to get some of the swelling out of the dorsal left foot this may be the etiology of the problem wound in this area. He has not been compliant with this in the past 3. I did not culture anything 4. I would like to see him back in 2 weeks Electronic Signature(s) Signed: 08/16/2021 5:06:47 PM By: Linton Ham MD Entered By: Linton Ham on 08/16/2021 13:39:02 -------------------------------------------------------------------------------- SuperBill Details Patient Name: Date of Service: [name] [name], [name] RDIN W. 08/16/2021 Medical Record  Number: [id number] Patient Account Number: [id number] Date of Birth/Sex: Treating RN: 1955-12-28 (65 y.o. [name], Lauren Primary Care Provider: [name] Other Clinician: Referring Provider: Treating Provider/Extender: [name] in Treatment: 90 Diagnosis Coding ICD-10 Codes Code Description [id number] Chronic venous hypertension (idiopathic) with ulcer and inflammation of bilateral lower extremity I89.0 Lymphedema, not elsewhere classified L97.821 Non-pressure chronic ulcer of other part of left lower leg limited to breakdown of skin L97.811 Non-pressure chronic ulcer of other part of right lower leg limited to breakdown of skin Facility Procedures CPT4: Code [id number] Description: 81 BILATERAL: Application of multi-layer venous compression system; leg (below knee), including ankle and t. Modifier: Quantity: 1 Physician Procedures : CPT4 Code Description Modifier [id number] - WC PHYS LEVEL 3 - EST PT ICD-10 Diagnosis Description L97.821 Non-pressure chronic ulcer of other part of left lower leg limited to breakdown of skin I87.333 Chronic venous hypertension (idiopathic) with  ulcer and inflammation of bilateral lower extremity L97.811 Non-pressure chronic ulcer of other part of right lower leg limited to breakdown of skin I89.0 Lymphedema, not elsewhere classified Quantity: 1 Electronic Signature(s) Signed: 08/16/2021 5:06:47 PM By: Linton Ham MD Entered By: Linton Ham on 08/16/2021 13:39:29

## 2021-08-19 DIAGNOSIS — I87321 Chronic venous hypertension (idiopathic) with inflammation of right lower extremity: Secondary | ICD-10-CM | POA: Diagnosis not present

## 2021-08-19 DIAGNOSIS — L97821 Non-pressure chronic ulcer of other part of left lower leg limited to breakdown of skin: Secondary | ICD-10-CM | POA: Diagnosis not present

## 2021-08-19 DIAGNOSIS — I87332 Chronic venous hypertension (idiopathic) with ulcer and inflammation of left lower extremity: Secondary | ICD-10-CM | POA: Diagnosis not present

## 2021-08-19 DIAGNOSIS — Z4800 Encounter for change or removal of nonsurgical wound dressing: Secondary | ICD-10-CM | POA: Diagnosis not present

## 2021-08-19 DIAGNOSIS — I110 Hypertensive heart disease with heart failure: Secondary | ICD-10-CM | POA: Diagnosis not present

## 2021-08-19 DIAGNOSIS — I5032 Chronic diastolic (congestive) heart failure: Secondary | ICD-10-CM | POA: Diagnosis not present

## 2021-08-23 DIAGNOSIS — I110 Hypertensive heart disease with heart failure: Secondary | ICD-10-CM | POA: Diagnosis not present

## 2021-08-23 DIAGNOSIS — L97821 Non-pressure chronic ulcer of other part of left lower leg limited to breakdown of skin: Secondary | ICD-10-CM | POA: Diagnosis not present

## 2021-08-23 DIAGNOSIS — Z4800 Encounter for change or removal of nonsurgical wound dressing: Secondary | ICD-10-CM | POA: Diagnosis not present

## 2021-08-23 DIAGNOSIS — I5032 Chronic diastolic (congestive) heart failure: Secondary | ICD-10-CM | POA: Diagnosis not present

## 2021-08-23 DIAGNOSIS — I87321 Chronic venous hypertension (idiopathic) with inflammation of right lower extremity: Secondary | ICD-10-CM | POA: Diagnosis not present

## 2021-08-23 DIAGNOSIS — I87332 Chronic venous hypertension (idiopathic) with ulcer and inflammation of left lower extremity: Secondary | ICD-10-CM | POA: Diagnosis not present

## 2021-08-26 DIAGNOSIS — I5032 Chronic diastolic (congestive) heart failure: Secondary | ICD-10-CM | POA: Diagnosis not present

## 2021-08-26 DIAGNOSIS — I87321 Chronic venous hypertension (idiopathic) with inflammation of right lower extremity: Secondary | ICD-10-CM | POA: Diagnosis not present

## 2021-08-26 DIAGNOSIS — I110 Hypertensive heart disease with heart failure: Secondary | ICD-10-CM | POA: Diagnosis not present

## 2021-08-26 DIAGNOSIS — L97821 Non-pressure chronic ulcer of other part of left lower leg limited to breakdown of skin: Secondary | ICD-10-CM | POA: Diagnosis not present

## 2021-08-26 DIAGNOSIS — I87332 Chronic venous hypertension (idiopathic) with ulcer and inflammation of left lower extremity: Secondary | ICD-10-CM | POA: Diagnosis not present

## 2021-08-26 DIAGNOSIS — Z4800 Encounter for change or removal of nonsurgical wound dressing: Secondary | ICD-10-CM | POA: Diagnosis not present

## 2021-08-30 DIAGNOSIS — I110 Hypertensive heart disease with heart failure: Secondary | ICD-10-CM | POA: Diagnosis not present

## 2021-08-30 DIAGNOSIS — G4733 Obstructive sleep apnea (adult) (pediatric): Secondary | ICD-10-CM | POA: Diagnosis not present

## 2021-08-30 DIAGNOSIS — E669 Obesity, unspecified: Secondary | ICD-10-CM | POA: Diagnosis not present

## 2021-08-30 DIAGNOSIS — I5032 Chronic diastolic (congestive) heart failure: Secondary | ICD-10-CM | POA: Diagnosis not present

## 2021-08-30 DIAGNOSIS — I87321 Chronic venous hypertension (idiopathic) with inflammation of right lower extremity: Secondary | ICD-10-CM | POA: Diagnosis not present

## 2021-08-30 DIAGNOSIS — Z4800 Encounter for change or removal of nonsurgical wound dressing: Secondary | ICD-10-CM | POA: Diagnosis not present

## 2021-08-30 DIAGNOSIS — I87332 Chronic venous hypertension (idiopathic) with ulcer and inflammation of left lower extremity: Secondary | ICD-10-CM | POA: Diagnosis not present

## 2021-08-30 DIAGNOSIS — L97821 Non-pressure chronic ulcer of other part of left lower leg limited to breakdown of skin: Secondary | ICD-10-CM | POA: Diagnosis not present

## 2021-09-03 DIAGNOSIS — Z4800 Encounter for change or removal of nonsurgical wound dressing: Secondary | ICD-10-CM | POA: Diagnosis not present

## 2021-09-03 DIAGNOSIS — I110 Hypertensive heart disease with heart failure: Secondary | ICD-10-CM | POA: Diagnosis not present

## 2021-09-03 DIAGNOSIS — I5032 Chronic diastolic (congestive) heart failure: Secondary | ICD-10-CM | POA: Diagnosis not present

## 2021-09-03 DIAGNOSIS — I87321 Chronic venous hypertension (idiopathic) with inflammation of right lower extremity: Secondary | ICD-10-CM | POA: Diagnosis not present

## 2021-09-03 DIAGNOSIS — I87332 Chronic venous hypertension (idiopathic) with ulcer and inflammation of left lower extremity: Secondary | ICD-10-CM | POA: Diagnosis not present

## 2021-09-03 DIAGNOSIS — L97821 Non-pressure chronic ulcer of other part of left lower leg limited to breakdown of skin: Secondary | ICD-10-CM | POA: Diagnosis not present

## 2021-09-06 DIAGNOSIS — I87321 Chronic venous hypertension (idiopathic) with inflammation of right lower extremity: Secondary | ICD-10-CM | POA: Diagnosis not present

## 2021-09-06 DIAGNOSIS — I5032 Chronic diastolic (congestive) heart failure: Secondary | ICD-10-CM | POA: Diagnosis not present

## 2021-09-06 DIAGNOSIS — Z4800 Encounter for change or removal of nonsurgical wound dressing: Secondary | ICD-10-CM | POA: Diagnosis not present

## 2021-09-06 DIAGNOSIS — I110 Hypertensive heart disease with heart failure: Secondary | ICD-10-CM | POA: Diagnosis not present

## 2021-09-06 DIAGNOSIS — I87332 Chronic venous hypertension (idiopathic) with ulcer and inflammation of left lower extremity: Secondary | ICD-10-CM | POA: Diagnosis not present

## 2021-09-06 DIAGNOSIS — L97821 Non-pressure chronic ulcer of other part of left lower leg limited to breakdown of skin: Secondary | ICD-10-CM | POA: Diagnosis not present

## 2021-09-09 DIAGNOSIS — I87332 Chronic venous hypertension (idiopathic) with ulcer and inflammation of left lower extremity: Secondary | ICD-10-CM | POA: Diagnosis not present

## 2021-09-09 DIAGNOSIS — Z9181 History of falling: Secondary | ICD-10-CM | POA: Diagnosis not present

## 2021-09-09 DIAGNOSIS — Z7982 Long term (current) use of aspirin: Secondary | ICD-10-CM | POA: Diagnosis not present

## 2021-09-09 DIAGNOSIS — Z791 Long term (current) use of non-steroidal anti-inflammatories (NSAID): Secondary | ICD-10-CM | POA: Diagnosis not present

## 2021-09-09 DIAGNOSIS — L97822 Non-pressure chronic ulcer of other part of left lower leg with fat layer exposed: Secondary | ICD-10-CM | POA: Diagnosis not present

## 2021-09-09 DIAGNOSIS — Z7902 Long term (current) use of antithrombotics/antiplatelets: Secondary | ICD-10-CM | POA: Diagnosis not present

## 2021-09-09 DIAGNOSIS — Z8673 Personal history of transient ischemic attack (TIA), and cerebral infarction without residual deficits: Secondary | ICD-10-CM | POA: Diagnosis not present

## 2021-09-09 DIAGNOSIS — E669 Obesity, unspecified: Secondary | ICD-10-CM | POA: Diagnosis not present

## 2021-09-09 DIAGNOSIS — I110 Hypertensive heart disease with heart failure: Secondary | ICD-10-CM | POA: Diagnosis not present

## 2021-09-09 DIAGNOSIS — I5032 Chronic diastolic (congestive) heart failure: Secondary | ICD-10-CM | POA: Diagnosis not present

## 2021-09-09 DIAGNOSIS — G4733 Obstructive sleep apnea (adult) (pediatric): Secondary | ICD-10-CM | POA: Diagnosis not present

## 2021-09-13 DIAGNOSIS — I87332 Chronic venous hypertension (idiopathic) with ulcer and inflammation of left lower extremity: Secondary | ICD-10-CM | POA: Diagnosis not present

## 2021-09-13 DIAGNOSIS — G4733 Obstructive sleep apnea (adult) (pediatric): Secondary | ICD-10-CM | POA: Diagnosis not present

## 2021-09-13 DIAGNOSIS — L97822 Non-pressure chronic ulcer of other part of left lower leg with fat layer exposed: Secondary | ICD-10-CM | POA: Diagnosis not present

## 2021-09-13 DIAGNOSIS — I110 Hypertensive heart disease with heart failure: Secondary | ICD-10-CM | POA: Diagnosis not present

## 2021-09-13 DIAGNOSIS — I5032 Chronic diastolic (congestive) heart failure: Secondary | ICD-10-CM | POA: Diagnosis not present

## 2021-09-13 DIAGNOSIS — E669 Obesity, unspecified: Secondary | ICD-10-CM | POA: Diagnosis not present

## 2021-09-14 ENCOUNTER — Other Ambulatory Visit: Payer: Self-pay

## 2021-09-14 ENCOUNTER — Encounter (HOSPITAL_BASED_OUTPATIENT_CLINIC_OR_DEPARTMENT_OTHER): Payer: Medicare Other | Attending: Internal Medicine | Admitting: Internal Medicine

## 2021-09-14 DIAGNOSIS — L97812 Non-pressure chronic ulcer of other part of right lower leg with fat layer exposed: Secondary | ICD-10-CM | POA: Diagnosis not present

## 2021-09-14 DIAGNOSIS — I110 Hypertensive heart disease with heart failure: Secondary | ICD-10-CM | POA: Diagnosis not present

## 2021-09-14 DIAGNOSIS — L97821 Non-pressure chronic ulcer of other part of left lower leg limited to breakdown of skin: Secondary | ICD-10-CM | POA: Diagnosis not present

## 2021-09-14 DIAGNOSIS — L97822 Non-pressure chronic ulcer of other part of left lower leg with fat layer exposed: Secondary | ICD-10-CM | POA: Diagnosis not present

## 2021-09-14 DIAGNOSIS — I87333 Chronic venous hypertension (idiopathic) with ulcer and inflammation of bilateral lower extremity: Secondary | ICD-10-CM | POA: Diagnosis not present

## 2021-09-14 DIAGNOSIS — I509 Heart failure, unspecified: Secondary | ICD-10-CM | POA: Diagnosis not present

## 2021-09-14 DIAGNOSIS — L97811 Non-pressure chronic ulcer of other part of right lower leg limited to breakdown of skin: Secondary | ICD-10-CM | POA: Insufficient documentation

## 2021-09-14 DIAGNOSIS — I87313 Chronic venous hypertension (idiopathic) with ulcer of bilateral lower extremity: Secondary | ICD-10-CM | POA: Diagnosis not present

## 2021-09-14 DIAGNOSIS — I890 Lymphedema, not elsewhere classified: Secondary | ICD-10-CM | POA: Diagnosis not present

## 2021-09-14 NOTE — Progress Notes (Signed)
[name] [name] ([id number]) , Visit Report for 09/14/2021 Arrival Information Details Patient Name: Date of Service: [name] [name] 09/14/2021 12:30 PM Medical Record Number: [id number] Patient Account Number: [id number] Date of Birth/Sex: Treating RN: 1956-06-17 (65 y.o. [name] [name] Primary Care Labrittany Wechter: [name] Other Clinician: Referring Japhet Morgenthaler: Treating Lindalou Soltis/Extender: [name] in Treatment: 30 Visit Information History Since Last Visit Added or deleted any medications: No Patient Arrived: Wheel Chair Any new allergies or adverse reactions: No Arrival Time: 13:12 Had a fall or experienced change in No Accompanied By: Bonney Leitz. other activities of daily living that may affect Transfer Assistance: Manual risk of falls: Patient Identification Verified: Yes Signs or symptoms of abuse/neglect since last visito No Patient Requires Transmission-Based Precautions: No Implantable device outside of the clinic excluding No Patient Has Alerts: Yes cellular tissue based products placed in the center Patient Alerts: R ABI = 1.25 since last visit: L ABI = 1.27 Has Dressing in Place as Prescribed: Yes Has Compression in Place as Prescribed: Yes Pain Present Now: Yes Electronic Signature(s) Signed: 09/14/2021 5:15:38 PM By: Dellie Catholic RN Entered By: Dellie Catholic on 09/14/2021 13:13:16 -------------------------------------------------------------------------------- Compression Therapy Details Patient Name: Date of Service: [name] Ro RDIN W. 09/14/2021 12:30 PM Medical Record Number: [id number] Patient Account Number: [id number] Date of Birth/Sex: Treating RN: 1955-11-28 (65 y.o. [name] [name] Primary Care Ruqayya Ventress: [name] Other Clinician: Referring Rosco Harriott: Treating Cathi Hazan/Extender: [name] in Treatment: 94 Compression Therapy Performed for Wound Assessment: Wound #65 Left,Distal,Anterior  Lower Leg Performed By: Clinician Levan Hurst, RN Compression Type: Four Layer Post Procedure Diagnosis Same as Pre-procedure Electronic Signature(s) Signed: 09/14/2021 5:39:42 PM By: Levan Hurst RN, BSN Entered By: Levan Hurst on 09/14/2021 13:48:58 -------------------------------------------------------------------------------- Compression Therapy Details Patient Name: Date of Service: [name] Ro [place] W. 09/14/2021 12:30 PM Medical Record Number: [id number] Patient Account Number: [id number] Date of Birth/Sex: Treating RN: [birth year] (65 y.o. [name] [name] Primary Care Skanda Worlds: [name] Other Clinician: Referring Lavene Penagos: Treating Cordai Rodrigue/Extender: [name] in Treatment: 94 Compression Therapy Performed for Wound Assessment: Wound #80 Left,Circumferential Lower Leg Performed By: Clinician Levan Hurst, RN Compression Type: Four Layer Post Procedure Diagnosis Same as Pre-procedure Electronic Signature(s) Signed: 09/14/2021 5:39:42 PM By: Levan Hurst RN, BSN Entered By: Levan Hurst on 09/14/2021 13:48:58 -------------------------------------------------------------------------------- Compression Therapy Details Patient Name: Date of Service: [name] Ro [place] W. 09/14/2021 12:30 PM Medical Record Number: [id number] Patient Account Number: [id number] Date of Birth/Sex: Treating RN: 07-25-1956 (65 y.o. [name] [name] Primary Care Jakyrie Totherow: [name] Other Clinician: Referring Malikai Gut: Treating Cheetara Hoge/Extender: [name] in Treatment: 94 Compression Therapy Performed for Wound Assessment: Wound #81 Right,Circumferential Lower Leg Performed By: Clinician Levan Hurst, RN Compression Type: Four Layer Post Procedure Diagnosis Same as Pre-procedure Electronic Signature(s) Signed: 09/14/2021 5:39:42 PM By: Levan Hurst RN, BSN Entered By: Levan Hurst on 09/14/2021  13:48:58 -------------------------------------------------------------------------------- Encounter Discharge Information Details Patient Name: Date of Service: [name] [name], [place] W. 09/14/2021 12:30 PM Medical Record Number: [id number] Patient Account Number: [id number] Date of Birth/Sex: Treating RN: [birth year] (65 y.o. [name] [name] Primary Care Demontay Grantham: [name] Other Clinician: Referring Carver Murakami: Treating Haelie Clapp/Extender: [name] in Treatment: 85 Encounter Discharge Information Items Discharge Condition: Stable Ambulatory Status: Wheelchair Discharge Destination: Home Transportation: Private Auto Accompanied By: girlfriend Schedule Follow-up Appointment: Yes Clinical Summary of Care: Patient Declined Electronic Signature(s) Signed: 09/14/2021 5:39:42 PM By: Levan Hurst RN, BSN Entered By:  Levan Hurst on 09/14/2021 14:50:58 -------------------------------------------------------------------------------- Lower Extremity Assessment Details Patient Name: Date of Service: [name] Ro RDIN W. 09/14/2021 12:30 PM Medical Record Number: [id number] Patient Account Number: [id number] Date of Birth/Sex: Treating RN: [birth year] (65 y.o. [name] [name] Primary Care Shanin Szymanowski: [name] Other Clinician: Referring Peri Kreft: Treating Ilene Witcher/Extender: [name] in Treatment: 94 Edema Assessment Assessed: [name]: No] [name]: No] Edema: [Left: Yes] [Right: Yes] Calf Left: Right: Point of Measurement: 30 cm From Medial Instep 44.2 cm 45.2 cm Ankle Left: Right: Point of Measurement: 13 cm From Medial Instep 27.8 cm 26.5 cm Electronic Signature(s) Signed: 09/14/2021 5:15:38 PM By: Dellie Catholic RN Entered By: Dellie Catholic on 09/14/2021 13:21:30 -------------------------------------------------------------------------------- Multi Wound Chart Details Patient Name: Date of Service: [name] [name], [name]  09/14/2021 12:30 PM Medical Record Number: [id number] Patient Account Number: [id number] Date of Birth/Sex: Treating RN: August 21, 1956 (65 y.o. [name] [name], Lauren Primary Care Remijio Holleran: [name] Other Clinician: Referring Mannie Ohlin: Treating Orestes Geiman/Extender: [name] in Treatment: 94 Vital Signs Height(in): 70 Pulse(bpm): 75 Weight(lbs): 360 Blood Pressure(mmHg): 171/94 Body Mass Index(BMI): 52 Temperature(F): 98 Respiratory Rate(breaths/min): 20 Photos: [65:Left, Distal, Anterior Lower Leg] [80:Left, Circumferential Lower Leg] [81:Right, Circumferential Lower Leg] Wound Location: [65:Gradually Appeared] [80:Gradually Appeared] [81:Gradually Appeared] Wounding Event: [65:Venous Leg Ulcer] [80:Venous Leg Ulcer] [81:Venous Leg Ulcer] Primary Etiology: [65:Lymphedema] [80:N/A] [81:N/A] Secondary Etiology: [65:Lymphedema, Sleep Apnea,] [80:Lymphedema, Sleep Apnea,] [81:Lymphedema, Sleep Apnea,] Comorbid History: [65:Congestive Heart Failure, Hypertension, Peripheral Venous Disease, Osteoarthritis 09/08/2019] [80:Congestive Heart Failure, Hypertension, Peripheral Venous Disease, Osteoarthritis 07/12/2021] [81:Congestive Heart Failure,  Hypertension, Peripheral Venous Disease, Osteoarthritis 07/12/2021] Date Acquired: [65:94] [80:9] [81:9] Weeks of Treatment: [65:Open] [80:Open] [81:Open] Wound Status: [65:Yes] [80:No] [81:No] Clustered Wound: [65:1] [80:N/A] [81:N/A] Clustered Quantity: [65:0.1x0.1x0.1] [80:11x4x0.1] [81:0.5x0.3x0.1] Measurements L x W x D (cm) [65:0.008] [80:34.558] [81:0.118] A (cm) : rea [65:0.001] [80:3.456] [81:0.012] Volume (cm) : [65:100.00%] [80:84.60%] [81:100.00%] % Reduction in Area: [65:100.00%] [80:84.60%] [81:100.00%] % Reduction in Volume: [65:Full Thickness Without Exposed] [80:Full Thickness Without Exposed] [81:Full Thickness Without Exposed] Classification: [65:Support Structures None Present] [80:Support Structures  Medium] [81:Support Structures Medium] Exudate Amount: [65:N/A] [80:Serosanguineous] [81:Serosanguineous] Exudate Type: [65:N/A] [80:red, brown] [81:red, brown] Exudate Color: [65:Distinct, outline attached] [80:Distinct, outline attached] [81:Distinct, outline attached] Wound Margin: [65:None Present (0%)] [80:Large (67-100%)] [81:Large (67-100%)] Granulation Amount: [65:N/A] [80:Red] [81:Red, Pink] Granulation Quality: [65:None Present (0%)] [80:None Present (0%)] [81:None Present (0%)] Necrotic Amount: [65:Fascia: No] [80:Fat Layer (Subcutaneous Tissue): Yes Fat Layer (Subcutaneous Tissue): Yes] Exposed Structures: [65:Fat Layer (Subcutaneous Tissue): No Tendon: No Muscle: No Joint: No Bone: No Large (67-100%)] [80:Fascia: No Tendon: No Muscle: No Joint: No Bone: No None] [81:Fascia: No Tendon: No Muscle: No Joint: No Bone: No None] Epithelialization: [65:N/A] [80:Anterior wounds within circumferential N/A] Assessment Notes: [65:Compression Therapy] [80:Compression Therapy] [81:Compression Therapy] Treatment Notes Electronic Signature(s) Signed: 09/14/2021 3:46:55 PM By: Linton Ham MD Signed: 09/14/2021 5:09:47 PM By: Rhae Hammock RN Entered By: Linton Ham on 09/14/2021 14:12:35 -------------------------------------------------------------------------------- Multi-Disciplinary Care Plan Details Patient Name: Date of Service: [name] [name], [name] 09/14/2021 12:30 PM Medical Record Number: [id number] Patient Account Number: [id number] Date of Birth/Sex: Treating RN: 1956/01/14 (65 y.o. [name] [name] Primary Care Taygen Acklin: [name] Other Clinician: Referring Telvin Reinders: Treating Brya Simerly/Extender: [name] in Treatment: 72 Active Inactive Venous Leg Ulcer Nursing Diagnoses: Knowledge deficit related to disease process and management Potential for venous Insuffiency (use before diagnosis confirmed) Goals: Patient will maintain optimal  edema control Date Initiated: 01/11/2021 Target Resolution Date: 10/14/2021 Goal Status:  Active Interventions: Assess peripheral edema status every visit. Compression as ordered Treatment Activities: Therapeutic compression applied : 01/11/2021 Notes: 06/14/21: Edema control ongoing. Wound/Skin Impairment Nursing Diagnoses: Knowledge deficit related to ulceration/compromised skin integrity Goals: Patient/caregiver will verbalize understanding of skin care regimen Date Initiated: 11/25/2019 Target Resolution Date: 10/14/2021 Goal Status: Active Ulcer/skin breakdown will have a volume reduction of 30% by week 4 Date Initiated: 11/25/2019 Date Inactivated: 01/06/2020 Target Resolution Date: 12/26/2019 Goal Status: Met Ulcer/skin breakdown will have a volume reduction of 50% by week 8 Date Initiated: 01/06/2020 Date Inactivated: 02/17/2020 Target Resolution Date: 02/05/2020 Goal Status: Met Interventions: Assess patient/caregiver ability to obtain necessary supplies Assess patient/caregiver ability to perform ulcer/skin care regimen upon admission and as needed Assess ulceration(s) every visit Notes: 06/14/21: Wound care regimen continues Electronic Signature(s) Signed: 09/14/2021 5:39:42 PM By: Levan Hurst RN, BSN Entered By: Levan Hurst on 09/14/2021 13:41:47 -------------------------------------------------------------------------------- Pain Assessment Details Patient Name: Date of Service: [name] Ro RDIN W. 09/14/2021 12:30 PM Medical Record Number: [id number] Patient Account Number: [id number] Date of Birth/Sex: Treating RN: 1956/06/01 (65 y.o. [name] [name] Primary Care Jamaiyah Pyle: [name] Other Clinician: Referring Kendel Pesnell: Treating Armas Mcbee/Extender: [name] in Treatment: 40 Active Problems Location of Pain Severity and Description of Pain Patient Has Paino Yes Site Locations Pain Location: Pain in Ulcers With Dressing Change:  No Duration of the Pain. Constant / Intermittento Constant Rate the pain. Current Pain Level: 10 Worst Pain Level: 10 Least Pain Level: 7 Tolerable Pain Level: 7 Character of Pain Describe the Pain: Sharp, Throbbing Pain Management and Medication Current Pain Management: Medication: Yes Cold Application: No Rest: Yes Massage: No Activity: Yes T.E.N.S.: No Heat Application: No Leg drop or elevation: No Is the Current Pain Management Adequate: Adequate How does your wound impact your activities of daily livingo Sleep: No Bathing: No Appetite: No Relationship With Others: No Bladder Continence: No Emotions: No Bowel Continence: No Work: No Toileting: No Drive: No Dressing: No Hobbies: No Notes As per patient "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 [name] [name] Primary Care Physician: [name] Other Clinician: Referring Physician: Treating Physician/Extender: [name] in Treatment: 41 Education Assessment Education Provided To: Patient Education Topics Provided Wound/Skin Impairment: Methods: Explain/Verbal Responses: State content correctly [employer]) Signed: 09/14/2021 5:39:42 PM By: Levan Hurst RN, BSN Entered By: Levan Hurst on 09/14/2021 13:42:11 -------------------------------------------------------------------------------- Wound Assessment Details Patient Name: Date of Service: [name] Ro RDIN W. 09/14/2021 12:30 PM Medical Record Number: [id number] Patient Account  Number: [id number] Date of Birth/Sex: Treating RN: November 18, 1955 (65 y.o. [name] [name] Primary Care Tamecia Mcdougald: [name] Other Clinician: Referring Dorisann Schwanke: Treating Mariame Rybolt/Extender: [name] in Treatment: 94 Wound Status Wound Number: 65 Primary Venous Leg Ulcer Etiology: Wound Location: Left, Distal, Anterior Lower Leg Secondary Lymphedema Wounding Event: Gradually Appeared Etiology: Date Acquired: 09/08/2019 Wound Open Weeks Of Treatment: 94 Status: Clustered Wound: Yes Comorbid Lymphedema, Sleep Apnea, Congestive Heart Failure, History: Hypertension, Peripheral Venous Disease, Osteoarthritis Photos Wound Measurements Length: (cm) 0.1 Width: (cm) 0.1 Depth: (cm) 0.1 Clustered Quantity: 1 Area: (cm) 0.008 Volume: (cm) 0.001 % Reduction in Area: 100% % Reduction in Volume: 100% Epithelialization: Large (67-100%) Tunneling: No Undermining:  No Wound Description Classification: Full Thickness Without Exposed Support Structures Wound Margin: Distinct, outline attached Exudate Amount: None Present Foul Odor After Cleansing: No Slough/Fibrino No Wound Bed Granulation Amount: None Present (0%) Exposed Structure Necrotic Amount: None Present (0%) Fascia Exposed: No Fat Layer (Subcutaneous Tissue) Exposed: No Tendon Exposed: No Muscle Exposed: No Joint Exposed: No Bone Exposed: No Treatment Notes Wound #65 (Lower Leg) Wound Laterality: Left, Anterior, Distal Cleanser Soap and Water Discharge Instruction: May shower and wash wound with dial antibacterial soap and water prior to dressing change. Normal Saline Discharge Instruction: Cleanse the wound with Normal Saline prior to applying a clean dressing using gauze sponges, not tissue or cotton balls. Peri-Wound Care Triamcinolone 15 (g) Discharge Instruction: Use triamcinolone 15 (g) as directed Sween Lotion (Moisturizing lotion) Discharge Instruction: Apply moisturizing lotion as  directed Topical Primary Dressing KerraCel Ag Gelling Fiber Dressing, 4x5 in (silver alginate) Discharge Instruction: Apply silver alginate to wound bed as instructed Secondary Dressing Woven Gauze Sponge, Non-Sterile 4x4 in Discharge Instruction: Apply over primary dressing as directed. ABD Pad, 5x9 Discharge Instruction: Apply over primary dressing as directed. Secured With Compression Wrap FourPress (4 layer compression wrap) Discharge Instruction: Apply four layer compression as directed. Compression Stockings Add-Ons Electronic Signature(s) Signed: 09/14/2021 5:15:38 PM By: Dellie Catholic RN Entered By: Dellie Catholic on 09/14/2021 13:39:05 -------------------------------------------------------------------------------- Wound Assessment Details Patient Name: Date of Service: [name] [name] 09/14/2021 12:30 PM Medical Record Number: [id number] Patient Account Number: [id number] Date of Birth/Sex: Treating RN: [birth year] (65 y.o. [name] [name] Primary Care Anzley Dibbern: [name] Other Clinician: Referring Adela Esteban: Treating Sharnell Knight/Extender: [name] in Treatment: 94 Wound Status Wound Number: 80 Primary Venous Leg Ulcer Etiology: Wound Location: Left, Circumferential Lower Leg Wound Open Wounding Event: Gradually Appeared Status: Date Acquired: 07/12/2021 Comorbid Lymphedema, Sleep Apnea, Congestive Heart Failure, Weeks Of Treatment: 9 History: Hypertension, Peripheral Venous Disease, Osteoarthritis Clustered Wound: No Photos Wound Measurements Length: (cm) 11 Width: (cm) 4 Depth: (cm) 0.1 Area: (cm) 34.558 Volume: (cm) 3.456 % Reduction in Area: 84.6% % Reduction in Volume: 84.6% Epithelialization: None Tunneling: No Undermining: No Wound Description Classification: Full Thickness Without Exposed Support Structures Wound Margin: Distinct, outline attached Exudate Amount: Medium Exudate Type:  Serosanguineous Exudate Color: red, brown Foul Odor After Cleansing: No Slough/Fibrino No Wound Bed Granulation Amount: Large (67-100%) Exposed Structure Granulation Quality: Red Fascia Exposed: No Necrotic Amount: None Present (0%) Fat Layer (Subcutaneous Tissue) Exposed: Yes Tendon Exposed: No Muscle Exposed: No Joint Exposed: No Bone Exposed: No Assessment Notes Anterior wounds within circumferential Treatment Notes Wound #80 (Lower Leg) Wound Laterality: Left, Circumferential Cleanser Soap and Water Discharge Instruction: May shower and wash wound with dial antibacterial soap and water prior to dressing change. Normal Saline Discharge Instruction: Cleanse the wound with Normal Saline prior to applying a clean dressing using gauze sponges, not tissue or cotton balls. Peri-Wound Care Triamcinolone 15 (g) Discharge Instruction: Use triamcinolone 15 (g) as directed Sween Lotion (Moisturizing lotion) Discharge Instruction: Apply moisturizing lotion as directed Topical Primary Dressing KerraCel Ag Gelling Fiber Dressing, 4x5 in (silver alginate) Discharge Instruction: Apply silver alginate to wound bed as instructed Secondary Dressing Woven Gauze Sponge, Non-Sterile 4x4 in Discharge Instruction: Apply over primary dressing as directed. ABD Pad, 5x9 Discharge Instruction: Apply over primary dressing as directed. Secured With Compression Wrap FourPress (4 layer compression wrap) Discharge Instruction: Apply four layer compression as directed. Compression Stockings Add-Ons Electronic Signature(s) Signed: 09/14/2021 5:15:38 PM By: Dellie Catholic RN Entered By: Dellie Catholic  on 09/14/2021 13:37:30 -------------------------------------------------------------------------------- Wound Assessment Details Patient Name: Date of Service: [name] Ro RDIN W. 09/14/2021 12:30 PM Medical Record Number: [id number] Patient Account Number: [id number] Date of Birth/Sex: Treating  RN: 04-19-1956 (65 y.o. [name] [name] Primary Care Chandi Nicklin: [name] Other Clinician: Referring Nomar Broad: Treating Rayme Bui/Extender: [name] in Treatment: 94 Wound Status Wound Number: 81 Primary Venous Leg Ulcer Etiology: Wound Location: Right, Circumferential Lower Leg Wound Open Wounding Event: Gradually Appeared Status: Date Acquired: 07/12/2021 Comorbid Lymphedema, Sleep Apnea, Congestive Heart Failure, Weeks Of Treatment: 9 Weeks Of Treatment: 9 History: Hypertension, Peripheral Venous Disease, Osteoarthritis Clustered Wound: No Photos Wound Measurements Length: (cm) 0.5 Width: (cm) 0.3 Depth: (cm) 0.1 Area: (cm) 0.118 Volume: (cm) 0.012 % Reduction in Area: 100% % Reduction in Volume: 100% Epithelialization: None Tunneling: No Undermining: No Wound Description Classification: Full Thickness Without Exposed Support Structures Wound Margin: Distinct, outline attached Exudate Amount: Medium Exudate Type: Serosanguineous Exudate Color: red, brown Foul Odor After Cleansing: No Slough/Fibrino No Wound Bed Granulation Amount: Large (67-100%) Exposed Structure Granulation Quality: Red, Pink Fascia Exposed: No Necrotic Amount: None Present (0%) Fat Layer (Subcutaneous Tissue) Exposed: Yes Tendon Exposed: No Muscle Exposed: No Joint Exposed: No Bone Exposed: No Treatment Notes Wound #81 (Lower Leg) Wound Laterality: Right, Circumferential Cleanser Soap and Water Discharge Instruction: May shower and wash wound with dial antibacterial soap and water prior to dressing change. Normal Saline Discharge Instruction: Cleanse the wound with Normal Saline prior to applying a clean dressing using gauze sponges, not tissue or cotton balls. Peri-Wound Care Triamcinolone 15 (g) Discharge Instruction: Use triamcinolone 15 (g) as directed Sween Lotion (Moisturizing lotion) Discharge Instruction: Apply moisturizing lotion as  directed Topical Primary Dressing KerraCel Ag Gelling Fiber Dressing, 4x5 in (silver alginate) Discharge Instruction: Apply silver alginate to wound bed as instructed Secondary Dressing Woven Gauze Sponge, Non-Sterile 4x4 in Discharge Instruction: Apply over primary dressing as directed. ABD Pad, 5x9 Discharge Instruction: Apply over primary dressing as directed. Secured With Compression Wrap FourPress (4 layer compression wrap) Discharge Instruction: Apply four layer compression as directed. Compression Stockings Add-Ons Electronic Signature(s) Signed: 09/14/2021 5:15:38 PM By: Dellie Catholic RN Entered By: Dellie Catholic on 09/14/2021 13:36:28 -------------------------------------------------------------------------------- Vitals Details Patient Name: Date of Service: [name] [name], [name] 09/14/2021 12:30 PM Medical Record Number: [id number] Patient Account Number: [id number] Date of Birth/Sex: Treating RN: 10/26/1955 (65 y.o. [name] [name] Primary Care Brit Wernette: [name] Other Clinician: Referring Rickell Wiehe: Treating Tobechukwu Emmick/Extender: [name] in Treatment: 94 Vital Signs Time Taken: 13:13 Temperature (F): 98 Height (in): 70 Pulse (bpm): 75 Weight (lbs): 360 Respiratory Rate (breaths/min): 20 Body Mass Index (BMI): 51.6 Blood Pressure (mmHg): 171/94 Reference Range: 80 - 120 mg / dl Electronic Signature(s) Signed: 09/14/2021 5:15:38 PM By: Dellie Catholic RN Entered By: Dellie Catholic on 09/14/2021 13:16:05

## 2021-09-14 NOTE — Progress Notes (Signed)
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 medial ankle. 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 Not surprisingly comes back in with wounds on his bilateral legs at the level of where  his external compression garment was tight superiorly. He tells me he is using his pumps once every second day. 7/20; 2-week follow-up the patient has not been using his compression pumps his wife was transferred to a new nursing home. He has 1 wound on the medial left lower leg and a small area on the right lateral 8/17; patient arrives today with only a smattering of small wounds on the left anterior lower leg most of these are eschared and look benign. There is nothing on his right leg. We have been using silver alginate under 4-layer compression 8/31; patient's wounds on the left anterior lower leg are larger. There is still nothing open on the right toe although we did put compression wraps on this last time. He has home health. Says he is used his compression pumps twice in the last 3 days. Obviously not sufficient 9/14; 2-week follow-up. We discharged him in his own zippered stocking. Apparently home health helped him change this and noted weeping edema fluid therefore put him back in a  wrap he arrives in the clinic today with no open wounds on the right innumerable small broken areas on the anterior left calf. Uncontrolled edema above the level of the wrap compatible with lymphedema 9/27 2-week follow-up. He arrives today with the left anterior lower extremity looking better. On the right he has a small open area posteriorly. I am going to put him back in compression on both sides. He claims compliance with his compression pumps at home twice a day he has home health changing his dressing 10/26; 1 month follow-up he has home health changing his dressings there is no open wound on the right leg he has extremitease stockings and external compression pumps which he says he is using once a day. The left leg is always been a more difficult site and although it is difficult to identify any precise open wound he has several leaking areas especially anteriorly and superiorly and at the edge of his wraps 11/9; 2-week follow-up. He has his extremities stocking on the right. He says he has been using his compression pumps once a day. He has 1 remaining wound on the left anterior mid tibia which looks healthy his edema control is otherwise good on the left 11/30; there is now a 3-week follow-up. We use his own compression stocking on the right last time as he has no open wounds. He apparently developed a UTI received a course of ciprofloxacin. He did not wear his compression pumps and I wonder whether he actually was using his stocking. Home health put a compression wrap on his right leg last week. He has multiple small open areas on the left anterior leg this week. 12/7; still a cluster of small areas on the left lateral calf and the right lateral calf. He has home health changing his dressings. For some reason he will not use compression pumps reliably this week because his wife spent 2 days in the hospital with anemia 12/21; he continues to have a cluster of small areas predominantly on the  left lateral but also the left anterior lower leg. More defined wounds on the right anterior and right medial. The he says he is using the compression pumps every other day. I have never understood the issue for this noncompliant. He still has home health coming out 1/11 the patient continues to have multiple small open areas on the left lateral left anterior and medial lower leg anteriorly. He also has a small punched-out painful  area in the crease of his left ankle which I think is probably a rapid injury. Very tender. I debrided in this area very adherent debris. Also small areas on the right lateral calf. We have been using silver alginate. His compression pump usage is probably marginal 2/8; the patient comes back in with his legs essentially in the same condition. He has multiple small areas on his bilateral lower extremities left greater than right anteriorly and posteriorly. A lot of these have small eschars on top some of them do not. His edema control is marginal. He says he uses his pumps once a day. He has home health changing his dressing 4-layer compression silver alginate as the primary dressing 3/1; Marked deterioration this visit. He has multiple small areas across the entirety of the lateral anterior and medial left lower leg. I am actually concerned that he may be on his way to a more substantive degree of breakdown in this area. He is using his compression pumps 4 times per week. He has home health changing this dressing week 3/29; again not much improvement from last time he is here significant bilateral small wounds for the most part skin breakdown. The larger areas are on the left lateral. Small areas bilaterally. He has poor edema control. Relates that he is not using his compression pumps with any reliability. He states his wife was in the hospital last week. He does not complain of chest pain or shortness of breath 4/26; patient presents for 1 month follow-up. He reports no  issues. He states that he last used his lymphedema pumps several weeks ago. He has been having his legs wrapped bilaterally with home health with 4-layer compression and silver alginate underneath. He has no complaints today. 5/31; patient presents for 1 month follow-up. He tolerates the compression wraps well. He states that home health continues to come out twice weekly T o change theses. He is unable to use his lymphedema pumps due to the fact that he has to take his shoes off. He states he cannot put his shoes on by himself. He denies any issues today. He denies signs of infection. 6/28; patient presents for 1 month follow-up. He has been tolerating compression wraps well. He states he developed a wound to the anterior part of his ankle due to the positioning of the padding placed by the home health nurse rubbing against the area. He states he noticed this getting worse in the last 2 weeks but this has since been corrected. He reports increased soreness to the specific area. Overall he is doing well 8/23; the patient arrives have not been seen here in almost 2 months. He still lives in [place]. He has [facility] home health twice a week that is the only dressing changes he gets. He reports that he wears these surgical sandals but does not feel he can get them on and off enough to use the pumps on a daily basis in fact he is only using them twice a week the rest of the time he leaves this very swollen left greater than right foot in the shoes. He arrives in clinic today with a cluster of small wounds all over the left leg. Very dry chronically inflamed skin in his left lower leg. He is actually better on the right although there are open areas laterally and posteriorly 9/27; the patient comes in for his monthly visit with 2 small open areas on the left and 1 on the right. His edema control is excellent  bilaterally and actually the condition of his skin is quite a bit better 2. He says he occasionally  uses his compression pumps, he is certainly not been compliant in this area . He does say that he has been using more of his Lasix.. This is his closest I am seeing this man to healed in quite a long time. We are still using silver alginate on anything that is open under 4-layer compression bilaterally. We have also been using TCA and Cetaphil to the skin liberally 10/11; patient presents for follow-up. He has no issues or complaints today. He denies signs of infection. 10/25; patient presents with a multitude of small wounds bilaterally mostly in the anterior tibial mid aspect. None of these looks particularly ominous. His edema control is not bad except on his dorsal feet left greater than right. He does not use his compression pumps. He has home health coming by and changing his dressing wraps. [place] 11/29; 1 month follow-up. Patient's wound areas look better he has an area on the right lateral calf small areas on the left anterior lower leg look better. The major concern here is an area on the left dorsal ankle. This has considerable depth and it may be down to either bone or calcification I am not sure which. There was no evidence of surrounding infection. Just distal to this severe nonpitting edema 12/28; 1 month follow-up. He continues to have excellent edema control. Area on the right lateral calf is closed and equally surprisingly the area on the left anterior ankle is also closed. He comes in with a story of twisting his leg while he was getting into a car around December 10. Over the course of the next 2 days he developed progressive ankle pain on the lateral aspect of the left ankle perhaps heel. He tells me he went to an urgent care and they did x-rays apparently there was no fracture. I do not have this information. He also went to podiatry they would not take his wrap off. He is barely able to  walk Electronic Signature(s) Signed: 09/14/2021 3:46:55 PM By: Linton Ham MD Entered By: Linton Ham on 09/14/2021 14:14:09 -------------------------------------------------------------------------------- Physical Exam Details Patient Name: Date of Service: [name] [name], [name] [place] W. 09/14/2021 12:30 PM Medical Record Number: [id number] Patient Account Number: [id number] Date of Birth/Sex: Treating RN: 04-10-1956 (65 y.o. [name] Primary Care Provider: [name] Other Clinician: Referring Provider: Treating Provider/Extender: [name] in Treatment: 59 Constitutional Patient is hypertensive.. Pulse regular and within target range for patient.Marland Kitchen Respirations regular, non-labored and within target range.. Temperature is normal and within the target range for the patient.Marland Kitchen Appears in no distress. Respiratory work of breathing is normal. Cardiovascular Pedal pulses are palpable. Excellent edema control. Have not seen this degree of edema control and him in a long time. Musculoskeletal Limited range of motion of the left ankle although I could detect no effusion. Very tender over the lateral malleolus and perhaps below this towards the calcaneus. There is no bruising no erythema although the area is slightly warm. Notes Wound exam; most of his small wounds on the left leg are closed as well as the right. We have excellent edema control. Electronic Signature(s) Signed: 09/14/2021 3:46:55 PM By: Linton Ham MD Entered By: Linton Ham on 09/14/2021 14:23:33 -------------------------------------------------------------------------------- Physician Orders Details Patient Name: Date of Service: [name] [name], [place]  W. 09/14/2021 12:30 PM Medical Record Number: [id number] Patient Account Number: [id number] Date of Birth/Sex: Treating RN: 10-Dec-1955 (65 y.o. [name] Primary Care Provider: [name] Other Clinician: Referring  Provider: Treating Provider/Extender: [name] in Treatment: 110 Verbal / Phone Orders: No Diagnosis Coding ICD-10 Coding Code Description [phone] Chronic venous hypertension (idiopathic) with ulcer and inflammation of bilateral lower extremity I89.0 Lymphedema, not elsewhere classified L97.821 Non-pressure chronic ulcer of other part of left lower leg limited to breakdown of skin L97.811 Non-pressure chronic ulcer of other part of right lower leg limited to breakdown of skin Follow-up Appointments Return appointment in 1 month. - Dr. Dellia Nims Other: - Call orthopedic specialist about pain in left ankle Bathing/ Shower/ Hygiene May shower with protection but do not get wound dressing(s) wet. Edema Control - Lymphedema / SCD / Other Lymphedema Pumps. Use Lymphedema pumps on leg(s) 2-3 times a day for 45-60 minutes. If wearing any wraps or hose, do not remove them. Continue exercising as instructed. - Use pumps over compression wraps Elevate legs to the level of the heart or above for 30 minutes daily and/or when sitting, a frequency of: - throughout the day Avoid standing for long periods of time. Exercise regularly Other Edema Control Orders/Instructions: - Bring compression stockings to next visit Home Health No change in wound care orders this week; continue Home Health for wound care. May utilize formulary equivalent dressing for wound treatment orders unless otherwise specified. Other Home Health Orders/Instructions: - [facility] 2 x a week Wound Treatment Wound #65 - Lower Leg Wound Laterality: Left, Anterior, Distal Cleanser: Soap and Water (Home Health) 2 x Per Week/30 Days Discharge Instructions: May shower and wash wound with dial antibacterial soap and water prior to dressing change. Cleanser: Normal Saline (Home Health) (Generic) 2 x Per Week/30 Days Discharge Instructions: Cleanse the wound with Normal Saline prior to applying a clean dressing using  gauze sponges, not tissue or cotton balls. Peri-Wound Care: Triamcinolone 15 (g) 2 x Per Week/30 Days Discharge Instructions: Use triamcinolone 15 (g) as directed Peri-Wound Care: Sween Lotion (Moisturizing lotion) (Home Health) (Generic) 2 x Per Week/30 Days Discharge Instructions: Apply moisturizing lotion as directed Prim Dressing: KerraCel Ag Gelling Fiber Dressing, 4x5 in (silver alginate) (Home Health) (Generic) 2 x Per Week/30 Days ary Discharge Instructions: Apply silver alginate to wound bed as instructed Secondary Dressing: Woven Gauze Sponge, Non-Sterile 4x4 in (Home Health) (Generic) 2 x Per Week/30 Days Discharge Instructions: Apply over primary dressing as directed. Secondary Dressing: ABD Pad, 5x9 (Home Health) 2 x Per Week/30 Days Discharge Instructions: Apply over primary dressing as directed. Compression Wrap: FourPress (4 layer compression wrap) (Home Health) (Generic) 2 x Per Week/30 Days Discharge Instructions: Apply four layer compression as directed. Wound #80 - Lower Leg Wound Laterality: Left, Circumferential Cleanser: Soap and Water (Home Health) 2 x Per Week/30 Days Discharge Instructions: May shower and wash wound with dial antibacterial soap and water prior to dressing change. Cleanser: Normal Saline (Home Health) (Generic) 2 x Per Week/30 Days Discharge Instructions: Cleanse the wound with Normal Saline prior to applying a clean dressing using gauze sponges, not tissue or cotton balls. Peri-Wound Care: Triamcinolone 15 (g) 2 x Per Week/30 Days Discharge Instructions: Use triamcinolone 15 (g) as directed Peri-Wound Care: Sween Lotion (Moisturizing lotion) (Home Health) (Generic) 2 x Per Week/30 Days Discharge Instructions: Apply moisturizing lotion as directed Prim Dressing: KerraCel Ag Gelling Fiber Dressing, 4x5 in (silver alginate) (Home Health) (Generic) 2 x Per Week/30 Days  ary Discharge Instructions: Apply silver alginate to wound bed as  instructed Secondary Dressing: Woven Gauze Sponge, Non-Sterile 4x4 in (Home Health) (Generic) 2 x Per Week/30 Days Discharge Instructions: Apply over primary dressing as directed. Secondary Dressing: ABD Pad, 5x9 (Home Health) 2 x Per Week/30 Days Discharge Instructions: Apply over primary dressing as directed. Compression Wrap: FourPress (4 layer compression wrap) (Home Health) (Generic) 2 x Per Week/30 Days Discharge Instructions: Apply four layer compression as directed. Wound #81 - Lower Leg Wound Laterality: Right, Circumferential Cleanser: Soap and Water (Home Health) 2 x Per Week/30 Days Discharge Instructions: May shower and wash wound with dial antibacterial soap and water prior to dressing change. Cleanser: Normal Saline (Home Health) (Generic) 2 x Per Week/30 Days Discharge Instructions: Cleanse the wound with Normal Saline prior to applying a clean dressing using gauze sponges, not tissue or cotton balls. Peri-Wound Care: Triamcinolone 15 (g) 2 x Per Week/30 Days Discharge Instructions: Use triamcinolone 15 (g) as directed Peri-Wound Care: Sween Lotion (Moisturizing lotion) (Home Health) (Generic) 2 x Per Week/30 Days Discharge Instructions: Apply moisturizing lotion as directed Prim Dressing: KerraCel Ag Gelling Fiber Dressing, 4x5 in (silver alginate) (Home Health) (Generic) 2 x Per Week/30 Days ary Discharge Instructions: Apply silver alginate to wound bed as instructed Secondary Dressing: Woven Gauze Sponge, Non-Sterile 4x4 in (Home Health) (Generic) 2 x Per Week/30 Days Discharge Instructions: Apply over primary dressing as directed. Secondary Dressing: ABD Pad, 5x9 (Home Health) 2 x Per Week/30 Days Discharge Instructions: Apply over primary dressing as directed. Compression Wrap: FourPress (4 layer compression wrap) (Home Health) (Generic) 2 x Per Week/30 Days Discharge Instructions: Apply four layer compression as directed. Electronic Signature(s) Signed: 09/14/2021  3:46:55 PM By: Linton Ham MD Signed: 09/14/2021 5:39:42 PM By: Levan Hurst RN, BSN Entered By: Levan Hurst on 09/14/2021 13:46:47 -------------------------------------------------------------------------------- Problem List Details Patient Name: Date of Service: [name] [name], [name] [place] W. 09/14/2021 12:30 PM Medical Record Number: [id number] Patient Account Number: [id number] Date of Birth/Sex: Treating RN: [birth year] (65 y.o. Collene Gobble Primary Care Provider: Other Clinician: [name] Referring Provider: Treating Provider/Extender: [name] in Treatment: 94 Active Problems ICD-10 Encounter Code Description Active Date MDM Diagnosis I87.333 Chronic venous hypertension (idiopathic) with ulcer and inflammation of 11/25/2019 No Yes bilateral lower extremity I89.0 Lymphedema, not elsewhere classified 11/25/2019 No Yes L97.821 Non-pressure chronic ulcer of other part of left lower leg limited to breakdown 11/25/2019 No Yes of skin L97.811 Non-pressure chronic ulcer of other part of right lower leg limited to breakdown 11/25/2019 No Yes of skin M25.572 Pain in left ankle and joints of left foot 09/14/2021 No Yes Inactive Problems ICD-10 Code Description Active Date Inactive Date L03.115 Cellulitis of right lower limb 03/09/2020 03/09/2020 Resolved Problems Electronic Signature(s) Signed: 09/14/2021 3:46:55 PM By: Linton Ham MD Entered By: Linton Ham on 09/14/2021 14:12:23 -------------------------------------------------------------------------------- Progress Note Details Patient Name: Date of Service: [name] [name], [name] [place] W. 09/14/2021 12:30 PM Medical Record Number: [id number] Patient Account Number: [id number] Date of Birth/Sex: Treating RN: [birth year] (65 y.o. [name] Primary Care Provider: [name] Other Clinician: Referring Provider: Treating Provider/Extender: [name] in Treatment:  94 Subjective History of Present Illness (HPI) The following HPI elements were documented for the patient's wound: Location: both legs Quality: Patient reports No Pain. Most of thelong history of chronic venous hypertension,chf,morbid obesity. s/p gsv ablation. cva 2oo4. sleep apnea andhbp. breakdown of skin both legs around 2 months ago. treated here for this in 2015.  no .dm. 05/09/2016 -- he had his arterial studies done last week and his right ABI was 1.3 and his left ABI was 1.4. His right toe brachial index was 0.98 and on the left was 0.9. Venous studies have only be done today and reports are awaited. 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 Donnetta Hutching. He had a arterial study done which showed a right ABI of 1.3 which is within normal limits at rest and a left ABI of 1.4 which is within normal limits at rest and may be falsely elevated. The right toe brachial index was 0.98 and the left toe brachial index was 0.90 05/30/2016 -- seen by Dr. Althea Charon -- is known to have a prior laser ablation of his left great saphenous vein in 2009. Prior to that he had 2 ablations of the odd same vein by interventional radiology. As noted to have severe venous hypertension bilaterally. 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 His right great saphenous vein is somewhat dilated but no evidence of reflux. The only deep venous reflux demonstrated was in his left common femoral vein. After every consideration Dr. Donnetta Hutching recommended reattempt at ablation versus removal of his left great saphenous vein in the operating room with the standard vein stripping technique. The patient would consider this and let him know again. 06/20/16 patient continues to wear a juxtalite on the  right leg without any open areas. On the lateral aspect of his left leg he has 4 wounds and a small area on the medial area of the left leg. Using silver alginate under Profore 06/27/16 still no open areas on the right leg. On the lateral aspect of his left leg he has 4 wounds which continue to have a nonviable surface and wheeze been using silver alginate under Profore 07/04/2016 -- patient hasn't yet to contact his vascular surgeon regarding plans for surgical intervention and I believe he is trying his best to avoid surgery. I have again discussed with him the futility of trying to heal this and keep it healed, if he does not agree to surgical intervention 07/11/2016 -- the patient has not had any juxta light ordered for at least 3 years and we will order him a pair. 08/08/2016 -- he has been approved for Apligraf and they will get this ready for him next week 08/15/2016 -- he has his first Apligraf applied today 08/29/2016 -- he has had his second application of Apligraf today 09/12/2016 -- his Apligraf has not arrived today due to the holiday 09/19/2016 -- he has had his third application of Apligraf today 10/03/2016 -- he has had his fourth application of Apligraf today. 10/18/2016 -- his next Apligraf has not arrived today. He has had chronic problems with his back and was to start on steroids and I have told him there are no objections against this. He is also taking appropriate medications as per his orthopedic doctor. 10/24/2016 -- he is here for his fifth application of Apligraf today. 01/09/2017 -- is been having repeated falls and problems with his back and saw a spine surgeon who has recommended holding his anticoagulation and will have some epidural injections in a few weeks. 03/13/2017 - he had been doing very well with his left lower extremity and the ulcerations that come down significantly. However last  week he may have hit himself against a metal cabinet and has started having  abrasions and because of this has started weeping from the right lateral calf. He has not used his compression since morning and his right lower extremity has markedly increased and lymphedema 03/27/17; the patient appears to be doing very well only a small cluster of wounds on the right lateral lower extremity. Most of the areas on his left anterior and left posterior leg are closed the wrong way to closing. His compression slipped down today there is irritation where the wrap edge was but no evidence of infection 04/24/2017 -- the patient has been using his lymphedema pumps and is also wearing his new compression on the right lower extremity. 05/01/2017 -- he has begun using his lymphedema pumps for a longer period of time but unfortunately had a fall and may have bruised his left lateral lower extremity under the 4-layer compression wrap and has multiple ulcerations in this area today 06/05/2017 -- after examination today he is noted to have taken a significant turn for the worse with multiple open ulcerations on his left lower calf and anterior leg. Lymphedema is better controlled and there is no evidence of cellulitis. I believe the patient is not being compliant with his lymphedema pumps. 06/12/2017 -- the patient did not come for his nurse visit change to the left lower extremity on Friday, as advised. He has not been doing his compression appropriately and now has developed a ulcerated area on the right lower extremity. He also has not been using his lymphedema pumps appropriately. in addition to this the patient tells me that he and his wife are going to the beach this coming Sunday for over a week 06/26/2017 -- the patient is back after 2 weeks when he had gone on vacation and his treatment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 the last 2 weeks but this has since been corrected. He reports increased soreness to the specific area. Overall he is doing well 8/23; the patient arrives have not been seen here in almost 2 months. He still lives in [place]. He has [facility] home health twice a week that is the only dressing changes he gets. He reports that he wears these surgical sandals but does not feel he can get them on and off enough to use the pumps on a daily basis in fact he is only using them twice a week the rest of the time he leaves this very swollen left greater than right foot in the shoes. He arrives in clinic today with a cluster of small wounds all over the left leg. Very dry chronically inflamed skin in his left lower leg. He is actually better on the right although there are open areas laterally and posteriorly 9/27; the patient comes in for his monthly visit with 2 small open areas on the left and 1 on the right. His edema control is excellent bilaterally and actually the condition of his skin is quite a bit better 2. He says he occasionally uses his compression pumps, he is certainly not been compliant in this area . He does say that he has been using more of his Lasix.. This is his closest I am seeing this man to healed in quite a long time. We are still using silver alginate on anything that is open under 4-layer compression bilaterally. We have also been using TCA and Cetaphil to the  skin liberally 10/11; patient presents for follow-up. He has no issues or complaints today. He denies signs of infection. 10/25; patient presents  with a multitude of small wounds bilaterally mostly in the anterior tibial mid aspect. None of these looks particularly ominous. His edema control is not bad except on his dorsal feet left greater than right. He does not use his compression pumps. He has home health coming by and changing his dressing wraps. [place] 11/29; 1 month follow-up. Patient's wound areas look better he has an area on the right lateral calf small areas on the left anterior lower leg look better. The major concern here is an area on the left dorsal ankle. This has considerable depth and it may be down to either bone or calcification I am not sure which. There was no evidence of surrounding infection. Just distal to this severe nonpitting edema 12/28; 1 month follow-up. He continues to have excellent edema control. Area on the right lateral calf is closed and equally surprisingly the area on the left anterior ankle is also closed. He comes in with a story of twisting his leg while he was getting into a car around December 10. Over the course of the next 2 days he developed progressive ankle pain on the lateral aspect of the left ankle perhaps heel. He tells me he went to an urgent care and they did x-rays apparently there was no fracture. I do not have this information. He also went to podiatry they would not take his wrap off. He is barely able to walk Objective Constitutional Patient is hypertensive.. Pulse regular and within target range for patient.Marland Kitchen Respirations regular, non-labored and within target range.. Temperature is normal and within the target range for the patient.Marland Kitchen Appears in no distress. Vitals Time Taken: 1:13 PM, Height: 70 in, Weight: 360 lbs, BMI: 51.6, Temperature: 98 F, Pulse:  75 bpm, Respiratory Rate: 20 breaths/min, Blood Pressure: 171/94 mmHg. Respiratory work of breathing is normal. Cardiovascular Pedal pulses are palpable. Excellent edema control. Have not seen this degree of edema control and him in a long time. Musculoskeletal Limited range of motion of the left ankle although I could detect no effusion. Very tender over the lateral malleolus and perhaps below this towards the calcaneus. There is no bruising no erythema although the area is slightly warm. General Notes: Wound exam; most of his small wounds on the left leg are closed as well as the right. We have excellent edema control. Integumentary (Hair, Skin) Wound #65 status is Open. Original cause of wound was Gradually Appeared. The date acquired was: 09/08/2019. The wound has been in treatment 94 weeks. The wound is located on the [facility] Lower Leg. The wound measures 0.1cm length x 0.1cm width x 0.1cm depth; 0.008cm^2 area and 0.001cm^3 volume. There is no tunneling or undermining noted. There is a none present amount of drainage noted. The wound margin is distinct with the outline attached to the wound base. There is no granulation within the wound bed. There is no necrotic tissue within the wound bed. Wound #80 status is Open. Original cause of wound was Gradually Appeared. The date acquired was: 07/12/2021. The wound has been in treatment 9 weeks. The wound is located on the Left,Circumferential Lower Leg. The wound measures 11cm length x 4cm width x 0.1cm depth; 34.558cm^2 area and 3.456cm^3 volume. There is Fat Layer (Subcutaneous Tissue) exposed. There is no tunneling or undermining noted. There is a medium amount of serosanguineous drainage noted. The wound margin is distinct with the outline attached  to the wound base. There is large (67-100%) red granulation within the wound bed. There is no necrotic tissue within the wound bed. General Notes: Anterior wounds within  circumferential Wound #81 status is Open. Original cause of wound was Gradually Appeared. The date acquired was: 07/12/2021. The wound has been in treatment 9 weeks. The wound is located on the Right,Circumferential Lower Leg. The wound measures 0.5cm length x 0.3cm width x 0.1cm depth; 0.118cm^2 area and 0.012cm^3 volume. There is Fat Layer (Subcutaneous Tissue) exposed. There is no tunneling or undermining noted. There is a medium amount of serosanguineous drainage noted. The wound margin is distinct with the outline attached to the wound base. There is large (67-100%) red, pink granulation within the wound bed. There is no necrotic tissue within the wound bed. 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 There was a Four Layer Compression Therapy Procedure by Levan Hurst, RN. Post procedure Diagnosis Wound #65: Same as Pre-Procedure Wound #80 Pre-procedure diagnosis of Wound #80 is a Venous Leg Ulcer located on the Left,Circumferential Lower Leg . There was a Four Layer Compression Therapy Procedure by Levan Hurst, RN. Post procedure Diagnosis Wound #80: Same as Pre-Procedure Wound #81 Pre-procedure diagnosis of Wound #81 is a Venous Leg Ulcer located on the Right,Circumferential Lower Leg . There was a Four Layer Compression Therapy Procedure by Levan Hurst, RN. Post procedure Diagnosis Wound #81: Same as Pre-Procedure Plan Follow-up Appointments: Return appointment in 1 month. - Dr. Dellia Nims Other: - Call  orthopedic specialist about pain in left ankle Bathing/ Shower/ Hygiene: May shower with protection but do not get wound dressing(s) wet. Edema Control - Lymphedema / SCD / Other: Lymphedema Pumps. Use Lymphedema pumps on leg(s) 2-3 times a day for 45-60 minutes. If wearing any wraps or hose, do not remove them. Continue exercising as instructed. - Use pumps over compression wraps Elevate legs to the level of the heart or above for 30 minutes daily and/or when sitting, a frequency of: - throughout the day Avoid standing for long periods of time. Exercise regularly Other Edema Control Orders/Instructions: - Bring compression stockings to next visit Home Health: No change in wound care orders this week; continue Home Health for wound care. May utilize formulary equivalent dressing for wound treatment orders unless otherwise specified. Other Home Health Orders/Instructions: - [facility] 2 x a week WOUND #65: - Lower Leg Wound Laterality: Left, Anterior, Distal Cleanser: Soap and Water (Home Health) 2 x Per Week/30 Days Discharge Instructions: May shower and wash wound with dial antibacterial soap and water prior to dressing change. Cleanser: Normal Saline (Home Health) (Generic) 2 x Per Week/30 Days Discharge Instructions: Cleanse the wound with Normal Saline prior to applying a clean dressing using gauze sponges, not tissue or cotton balls. Peri-Wound Care: Triamcinolone 15 (g) 2 x Per Week/30 Days Discharge Instructions: Use triamcinolone 15 (g) as directed Peri-Wound Care: Sween Lotion (Moisturizing lotion) (Home Health) (Generic) 2 x Per Week/30 Days Discharge Instructions: Apply moisturizing lotion as directed Prim Dressing: KerraCel Ag Gelling Fiber Dressing, 4x5 in (silver alginate) (Home Health) (Generic) 2 x Per  Week/30 Days ary Discharge Instructions: Apply silver alginate to wound bed as instructed Secondary Dressing: Woven Gauze Sponge, Non-Sterile 4x4 in (Home Health) (Generic) 2 x  Per Week/30 Days Discharge Instructions: Apply over primary dressing as directed. Secondary Dressing: ABD Pad, 5x9 (Home Health) 2 x Per Week/30 Days Discharge Instructions: Apply over primary dressing as directed. Com pression Wrap: FourPress (4 layer compression wrap) (Home Health) (Generic) 2 x Per Week/30 Days Discharge Instructions: Apply four layer compression as directed. WOUND #80: - Lower Leg Wound Laterality: Left, Circumferential Cleanser: Soap and Water (Home Health) 2 x Per Week/30 Days Discharge Instructions: May shower and wash wound with dial antibacterial soap and water prior to dressing change. Cleanser: Normal Saline (Home Health) (Generic) 2 x Per Week/30 Days Discharge Instructions: Cleanse the wound with Normal Saline prior to applying a clean dressing using gauze sponges, not tissue or cotton balls. Peri-Wound Care: Triamcinolone 15 (g) 2 x Per Week/30 Days Discharge Instructions: Use triamcinolone 15 (g) as directed Peri-Wound Care: Sween Lotion (Moisturizing lotion) (Home Health) (Generic) 2 x Per Week/30 Days Discharge Instructions: Apply moisturizing lotion as directed Prim Dressing: KerraCel Ag Gelling Fiber Dressing, 4x5 in (silver alginate) (Home Health) (Generic) 2 x Per Week/30 Days ary Discharge Instructions: Apply silver alginate to wound bed as instructed Secondary Dressing: Woven Gauze Sponge, Non-Sterile 4x4 in (Home Health) (Generic) 2 x Per Week/30 Days Discharge Instructions: Apply over primary dressing as directed. Secondary Dressing: ABD Pad, 5x9 (Home Health) 2 x Per Week/30 Days Discharge Instructions: Apply over primary dressing as directed. Com pression Wrap: FourPress (4 layer compression wrap) (Home Health) (Generic) 2 x Per Week/30 Days Discharge Instructions: Apply four layer compression as directed. WOUND #81: - Lower Leg Wound Laterality: Right, Circumferential Cleanser: Soap and Water (Home Health) 2 x Per Week/30 Days Discharge  Instructions: May shower and wash wound with dial antibacterial soap and water prior to dressing change. Cleanser: Normal Saline (Home Health) (Generic) 2 x Per Week/30 Days Discharge Instructions: Cleanse the wound with Normal Saline prior to applying a clean dressing using gauze sponges, not tissue or cotton balls. Peri-Wound Care: Triamcinolone 15 (g) 2 x Per Week/30 Days Discharge Instructions: Use triamcinolone 15 (g) as directed Peri-Wound Care: Sween Lotion (Moisturizing lotion) (Home Health) (Generic) 2 x Per Week/30 Days Discharge Instructions: Apply moisturizing lotion as directed Prim Dressing: KerraCel Ag Gelling Fiber Dressing, 4x5 in (silver alginate) (Home Health) (Generic) 2 x Per Week/30 Days ary Discharge Instructions: Apply silver alginate to wound bed as instructed Secondary Dressing: Woven Gauze Sponge, Non-Sterile 4x4 in (Home Health) (Generic) 2 x Per Week/30 Days Discharge Instructions: Apply over primary dressing as directed. Secondary Dressing: ABD Pad, 5x9 (Home Health) 2 x Per Week/30 Days Discharge Instructions: Apply over primary dressing as directed. Com pression Wrap: FourPress (4 layer compression wrap) (Home Health) (Generic) 2 x Per Week/30 Days Discharge Instructions: Apply four layer compression as directed. 1. We again use silver alginate and put him back in 4-layer compression. He has home health changing the dressing 2. He has compression pumps but simply does not use them 3. His wounds on his bilateral lower legs including the right lateral calf and the left anterior ankle are essentially healed 4. Left ankle pain. Except for the duration of time here I would have thought this is perhaps fairly significant sprain but I cannot rule out an occult fracture that was missed on his original plain x-rays. He does not have a joint effusion there is no warmth over the joint  itself I do not think this is a monoarticular arthritis. I have advised him to see  orthopedics ASAP he needs repeat x-rays may require an MRI I will follow him again in a month I have asked him to bring his compression stockings Electronic Signature(s) Signed: 09/14/2021 3:46:55 PM By: Linton Ham MD Entered By: Linton Ham on 09/14/2021 14:25:30 -------------------------------------------------------------------------------- SuperBill Details Patient Name: Date of Service: [name] [name], [place] 09/14/2021 Medical Record Number: [id number] Patient Account Number: [id number] Date of Birth/Sex: Treating RN: 05-20-1956 (65 y.o. [name] [name], Lauren Primary Care Provider: [name] Other Clinician: Referring Provider: Treating Provider/Extender: [name] in Treatment: 94 Diagnosis Coding ICD-10 Codes Code Description [phone] Chronic venous hypertension (idiopathic) with ulcer and inflammation of bilateral lower extremity I89.0 Lymphedema, not elsewhere classified L97.821 Non-pressure chronic ulcer of other part of left lower leg limited to breakdown of skin L97.811 Non-pressure chronic ulcer of other part of right lower leg limited to breakdown of skin M25.572 Pain in left ankle and joints of left foot Facility Procedures CPT4: Code [id number] foot Description: 1 BILATERAL: Application of multi-layer venous compression system; leg (below knee), including ankle and . 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

## 2021-09-15 DIAGNOSIS — S93492A Sprain of other ligament of left ankle, initial encounter: Secondary | ICD-10-CM | POA: Diagnosis not present

## 2021-09-15 DIAGNOSIS — M79672 Pain in left foot: Secondary | ICD-10-CM | POA: Diagnosis not present

## 2021-09-15 DIAGNOSIS — M25572 Pain in left ankle and joints of left foot: Secondary | ICD-10-CM | POA: Diagnosis not present

## 2021-09-16 DIAGNOSIS — E669 Obesity, unspecified: Secondary | ICD-10-CM | POA: Diagnosis not present

## 2021-09-16 DIAGNOSIS — I5032 Chronic diastolic (congestive) heart failure: Secondary | ICD-10-CM | POA: Diagnosis not present

## 2021-09-16 DIAGNOSIS — I110 Hypertensive heart disease with heart failure: Secondary | ICD-10-CM | POA: Diagnosis not present

## 2021-09-16 DIAGNOSIS — G4733 Obstructive sleep apnea (adult) (pediatric): Secondary | ICD-10-CM | POA: Diagnosis not present

## 2021-09-16 DIAGNOSIS — I87332 Chronic venous hypertension (idiopathic) with ulcer and inflammation of left lower extremity: Secondary | ICD-10-CM | POA: Diagnosis not present

## 2021-09-16 DIAGNOSIS — L97822 Non-pressure chronic ulcer of other part of left lower leg with fat layer exposed: Secondary | ICD-10-CM | POA: Diagnosis not present

## 2021-09-20 DIAGNOSIS — G4733 Obstructive sleep apnea (adult) (pediatric): Secondary | ICD-10-CM | POA: Diagnosis not present

## 2021-09-20 DIAGNOSIS — E669 Obesity, unspecified: Secondary | ICD-10-CM | POA: Diagnosis not present

## 2021-09-20 DIAGNOSIS — I110 Hypertensive heart disease with heart failure: Secondary | ICD-10-CM | POA: Diagnosis not present

## 2021-09-20 DIAGNOSIS — I5032 Chronic diastolic (congestive) heart failure: Secondary | ICD-10-CM | POA: Diagnosis not present

## 2021-09-20 DIAGNOSIS — I87332 Chronic venous hypertension (idiopathic) with ulcer and inflammation of left lower extremity: Secondary | ICD-10-CM | POA: Diagnosis not present

## 2021-09-20 DIAGNOSIS — L97822 Non-pressure chronic ulcer of other part of left lower leg with fat layer exposed: Secondary | ICD-10-CM | POA: Diagnosis not present

## 2021-09-23 DIAGNOSIS — G4733 Obstructive sleep apnea (adult) (pediatric): Secondary | ICD-10-CM | POA: Diagnosis not present

## 2021-09-23 DIAGNOSIS — L97822 Non-pressure chronic ulcer of other part of left lower leg with fat layer exposed: Secondary | ICD-10-CM | POA: Diagnosis not present

## 2021-09-23 DIAGNOSIS — I110 Hypertensive heart disease with heart failure: Secondary | ICD-10-CM | POA: Diagnosis not present

## 2021-09-23 DIAGNOSIS — I5032 Chronic diastolic (congestive) heart failure: Secondary | ICD-10-CM | POA: Diagnosis not present

## 2021-09-23 DIAGNOSIS — E669 Obesity, unspecified: Secondary | ICD-10-CM | POA: Diagnosis not present

## 2021-09-23 DIAGNOSIS — I87332 Chronic venous hypertension (idiopathic) with ulcer and inflammation of left lower extremity: Secondary | ICD-10-CM | POA: Diagnosis not present

## 2021-09-26 DIAGNOSIS — R5381 Other malaise: Secondary | ICD-10-CM | POA: Diagnosis not present

## 2021-09-26 DIAGNOSIS — R531 Weakness: Secondary | ICD-10-CM | POA: Diagnosis not present

## 2021-09-27 DIAGNOSIS — G4733 Obstructive sleep apnea (adult) (pediatric): Secondary | ICD-10-CM | POA: Diagnosis not present

## 2021-09-27 DIAGNOSIS — I87332 Chronic venous hypertension (idiopathic) with ulcer and inflammation of left lower extremity: Secondary | ICD-10-CM | POA: Diagnosis not present

## 2021-09-27 DIAGNOSIS — I5032 Chronic diastolic (congestive) heart failure: Secondary | ICD-10-CM | POA: Diagnosis not present

## 2021-09-27 DIAGNOSIS — L97822 Non-pressure chronic ulcer of other part of left lower leg with fat layer exposed: Secondary | ICD-10-CM | POA: Diagnosis not present

## 2021-09-27 DIAGNOSIS — E669 Obesity, unspecified: Secondary | ICD-10-CM | POA: Diagnosis not present

## 2021-09-27 DIAGNOSIS — I110 Hypertensive heart disease with heart failure: Secondary | ICD-10-CM | POA: Diagnosis not present

## 2021-09-30 DIAGNOSIS — I5032 Chronic diastolic (congestive) heart failure: Secondary | ICD-10-CM | POA: Diagnosis not present

## 2021-09-30 DIAGNOSIS — I110 Hypertensive heart disease with heart failure: Secondary | ICD-10-CM | POA: Diagnosis not present

## 2021-09-30 DIAGNOSIS — I87332 Chronic venous hypertension (idiopathic) with ulcer and inflammation of left lower extremity: Secondary | ICD-10-CM | POA: Diagnosis not present

## 2021-09-30 DIAGNOSIS — L97822 Non-pressure chronic ulcer of other part of left lower leg with fat layer exposed: Secondary | ICD-10-CM | POA: Diagnosis not present

## 2021-09-30 DIAGNOSIS — E669 Obesity, unspecified: Secondary | ICD-10-CM | POA: Diagnosis not present

## 2021-09-30 DIAGNOSIS — G4733 Obstructive sleep apnea (adult) (pediatric): Secondary | ICD-10-CM | POA: Diagnosis not present

## 2021-10-04 DIAGNOSIS — G4733 Obstructive sleep apnea (adult) (pediatric): Secondary | ICD-10-CM | POA: Diagnosis not present

## 2021-10-04 DIAGNOSIS — L97822 Non-pressure chronic ulcer of other part of left lower leg with fat layer exposed: Secondary | ICD-10-CM | POA: Diagnosis not present

## 2021-10-04 DIAGNOSIS — E669 Obesity, unspecified: Secondary | ICD-10-CM | POA: Diagnosis not present

## 2021-10-04 DIAGNOSIS — I110 Hypertensive heart disease with heart failure: Secondary | ICD-10-CM | POA: Diagnosis not present

## 2021-10-04 DIAGNOSIS — I5032 Chronic diastolic (congestive) heart failure: Secondary | ICD-10-CM | POA: Diagnosis not present

## 2021-10-04 DIAGNOSIS — I87332 Chronic venous hypertension (idiopathic) with ulcer and inflammation of left lower extremity: Secondary | ICD-10-CM | POA: Diagnosis not present

## 2021-10-07 DIAGNOSIS — I87332 Chronic venous hypertension (idiopathic) with ulcer and inflammation of left lower extremity: Secondary | ICD-10-CM | POA: Diagnosis not present

## 2021-10-07 DIAGNOSIS — E669 Obesity, unspecified: Secondary | ICD-10-CM | POA: Diagnosis not present

## 2021-10-07 DIAGNOSIS — I5032 Chronic diastolic (congestive) heart failure: Secondary | ICD-10-CM | POA: Diagnosis not present

## 2021-10-07 DIAGNOSIS — G4733 Obstructive sleep apnea (adult) (pediatric): Secondary | ICD-10-CM | POA: Diagnosis not present

## 2021-10-07 DIAGNOSIS — L97822 Non-pressure chronic ulcer of other part of left lower leg with fat layer exposed: Secondary | ICD-10-CM | POA: Diagnosis not present

## 2021-10-07 DIAGNOSIS — I110 Hypertensive heart disease with heart failure: Secondary | ICD-10-CM | POA: Diagnosis not present

## 2021-10-09 DIAGNOSIS — I87332 Chronic venous hypertension (idiopathic) with ulcer and inflammation of left lower extremity: Secondary | ICD-10-CM | POA: Diagnosis not present

## 2021-10-09 DIAGNOSIS — L97822 Non-pressure chronic ulcer of other part of left lower leg with fat layer exposed: Secondary | ICD-10-CM | POA: Diagnosis not present

## 2021-10-09 DIAGNOSIS — Z791 Long term (current) use of non-steroidal anti-inflammatories (NSAID): Secondary | ICD-10-CM | POA: Diagnosis not present

## 2021-10-09 DIAGNOSIS — Z8673 Personal history of transient ischemic attack (TIA), and cerebral infarction without residual deficits: Secondary | ICD-10-CM | POA: Diagnosis not present

## 2021-10-09 DIAGNOSIS — Z7902 Long term (current) use of antithrombotics/antiplatelets: Secondary | ICD-10-CM | POA: Diagnosis not present

## 2021-10-09 DIAGNOSIS — G4733 Obstructive sleep apnea (adult) (pediatric): Secondary | ICD-10-CM | POA: Diagnosis not present

## 2021-10-09 DIAGNOSIS — I110 Hypertensive heart disease with heart failure: Secondary | ICD-10-CM | POA: Diagnosis not present

## 2021-10-09 DIAGNOSIS — I5032 Chronic diastolic (congestive) heart failure: Secondary | ICD-10-CM | POA: Diagnosis not present

## 2021-10-09 DIAGNOSIS — Z7982 Long term (current) use of aspirin: Secondary | ICD-10-CM | POA: Diagnosis not present

## 2021-10-09 DIAGNOSIS — E669 Obesity, unspecified: Secondary | ICD-10-CM | POA: Diagnosis not present

## 2021-10-09 DIAGNOSIS — Z9181 History of falling: Secondary | ICD-10-CM | POA: Diagnosis not present

## 2021-10-11 ENCOUNTER — Encounter (HOSPITAL_BASED_OUTPATIENT_CLINIC_OR_DEPARTMENT_OTHER): Payer: Medicare Other | Admitting: Internal Medicine

## 2021-10-14 ENCOUNTER — Emergency Department (HOSPITAL_COMMUNITY): Payer: Medicare Other

## 2021-10-14 ENCOUNTER — Inpatient Hospital Stay (HOSPITAL_COMMUNITY)
Admission: EM | Admit: 2021-10-14 | Discharge: 2021-10-25 | DRG: 616 | Disposition: A | Discharge status: Skilled Nursing Facility | Payer: Medicare Other | Attending: Family Medicine | Admitting: Family Medicine

## 2021-10-14 DIAGNOSIS — E1169 Type 2 diabetes mellitus with other specified complication: Secondary | ICD-10-CM | POA: Diagnosis not present

## 2021-10-14 DIAGNOSIS — L0390 Cellulitis, unspecified: Secondary | ICD-10-CM | POA: Diagnosis present

## 2021-10-14 DIAGNOSIS — G546 Phantom limb syndrome with pain: Secondary | ICD-10-CM | POA: Diagnosis present

## 2021-10-14 DIAGNOSIS — Z8673 Personal history of transient ischemic attack (TIA), and cerebral infarction without residual deficits: Secondary | ICD-10-CM

## 2021-10-14 DIAGNOSIS — I5033 Acute on chronic diastolic (congestive) heart failure: Secondary | ICD-10-CM | POA: Diagnosis not present

## 2021-10-14 DIAGNOSIS — K2951 Unspecified chronic gastritis with bleeding: Secondary | ICD-10-CM | POA: Diagnosis present

## 2021-10-14 DIAGNOSIS — E669 Obesity, unspecified: Secondary | ICD-10-CM | POA: Diagnosis not present

## 2021-10-14 DIAGNOSIS — Z7982 Long term (current) use of aspirin: Secondary | ICD-10-CM

## 2021-10-14 DIAGNOSIS — L03116 Cellulitis of left lower limb: Secondary | ICD-10-CM | POA: Diagnosis not present

## 2021-10-14 DIAGNOSIS — M869 Osteomyelitis, unspecified: Secondary | ICD-10-CM | POA: Diagnosis not present

## 2021-10-14 DIAGNOSIS — L89322 Pressure ulcer of left buttock, stage 2: Secondary | ICD-10-CM | POA: Diagnosis present

## 2021-10-14 DIAGNOSIS — D649 Anemia, unspecified: Secondary | ICD-10-CM | POA: Diagnosis not present

## 2021-10-14 DIAGNOSIS — K921 Melena: Secondary | ICD-10-CM | POA: Diagnosis not present

## 2021-10-14 DIAGNOSIS — M7989 Other specified soft tissue disorders: Secondary | ICD-10-CM | POA: Diagnosis not present

## 2021-10-14 DIAGNOSIS — I878 Other specified disorders of veins: Secondary | ICD-10-CM | POA: Diagnosis present

## 2021-10-14 DIAGNOSIS — I5032 Chronic diastolic (congestive) heart failure: Secondary | ICD-10-CM | POA: Diagnosis not present

## 2021-10-14 DIAGNOSIS — L97929 Non-pressure chronic ulcer of unspecified part of left lower leg with unspecified severity: Secondary | ICD-10-CM | POA: Diagnosis not present

## 2021-10-14 DIAGNOSIS — N281 Cyst of kidney, acquired: Secondary | ICD-10-CM | POA: Diagnosis not present

## 2021-10-14 DIAGNOSIS — K635 Polyp of colon: Secondary | ICD-10-CM | POA: Diagnosis not present

## 2021-10-14 DIAGNOSIS — L8990 Pressure ulcer of unspecified site, unspecified stage: Secondary | ICD-10-CM | POA: Diagnosis not present

## 2021-10-14 DIAGNOSIS — L97819 Non-pressure chronic ulcer of other part of right lower leg with unspecified severity: Secondary | ICD-10-CM | POA: Diagnosis present

## 2021-10-14 DIAGNOSIS — Z77090 Contact with and (suspected) exposure to asbestos: Secondary | ICD-10-CM | POA: Diagnosis present

## 2021-10-14 DIAGNOSIS — I110 Hypertensive heart disease with heart failure: Secondary | ICD-10-CM | POA: Diagnosis present

## 2021-10-14 DIAGNOSIS — I70202 Unspecified atherosclerosis of native arteries of extremities, left leg: Secondary | ICD-10-CM | POA: Diagnosis not present

## 2021-10-14 DIAGNOSIS — R079 Chest pain, unspecified: Secondary | ICD-10-CM | POA: Diagnosis not present

## 2021-10-14 DIAGNOSIS — K633 Ulcer of intestine: Secondary | ICD-10-CM | POA: Diagnosis not present

## 2021-10-14 DIAGNOSIS — I87332 Chronic venous hypertension (idiopathic) with ulcer and inflammation of left lower extremity: Secondary | ICD-10-CM | POA: Diagnosis not present

## 2021-10-14 DIAGNOSIS — D62 Acute posthemorrhagic anemia: Secondary | ICD-10-CM | POA: Diagnosis present

## 2021-10-14 DIAGNOSIS — R0602 Shortness of breath: Secondary | ICD-10-CM | POA: Diagnosis not present

## 2021-10-14 DIAGNOSIS — I10 Essential (primary) hypertension: Secondary | ICD-10-CM

## 2021-10-14 DIAGNOSIS — L02416 Cutaneous abscess of left lower limb: Secondary | ICD-10-CM | POA: Diagnosis present

## 2021-10-14 DIAGNOSIS — Z8249 Family history of ischemic heart disease and other diseases of the circulatory system: Secondary | ICD-10-CM

## 2021-10-14 DIAGNOSIS — N179 Acute kidney failure, unspecified: Secondary | ICD-10-CM

## 2021-10-14 DIAGNOSIS — K254 Chronic or unspecified gastric ulcer with hemorrhage: Secondary | ICD-10-CM | POA: Diagnosis present

## 2021-10-14 DIAGNOSIS — Z833 Family history of diabetes mellitus: Secondary | ICD-10-CM

## 2021-10-14 DIAGNOSIS — E875 Hyperkalemia: Secondary | ICD-10-CM | POA: Diagnosis not present

## 2021-10-14 DIAGNOSIS — G0491 Myelitis, unspecified: Secondary | ICD-10-CM | POA: Diagnosis present

## 2021-10-14 DIAGNOSIS — Z89512 Acquired absence of left leg below knee: Secondary | ICD-10-CM | POA: Diagnosis not present

## 2021-10-14 DIAGNOSIS — I509 Heart failure, unspecified: Secondary | ICD-10-CM | POA: Diagnosis not present

## 2021-10-14 DIAGNOSIS — M79605 Pain in left leg: Secondary | ICD-10-CM

## 2021-10-14 DIAGNOSIS — R600 Localized edema: Secondary | ICD-10-CM | POA: Diagnosis not present

## 2021-10-14 DIAGNOSIS — I517 Cardiomegaly: Secondary | ICD-10-CM | POA: Diagnosis not present

## 2021-10-14 DIAGNOSIS — G4733 Obstructive sleep apnea (adult) (pediatric): Secondary | ICD-10-CM | POA: Diagnosis not present

## 2021-10-14 DIAGNOSIS — N5089 Other specified disorders of the male genital organs: Secondary | ICD-10-CM | POA: Diagnosis present

## 2021-10-14 DIAGNOSIS — Q666 Other congenital valgus deformities of feet: Secondary | ICD-10-CM | POA: Diagnosis not present

## 2021-10-14 DIAGNOSIS — M009 Pyogenic arthritis, unspecified: Secondary | ICD-10-CM | POA: Diagnosis present

## 2021-10-14 DIAGNOSIS — K625 Hemorrhage of anus and rectum: Secondary | ICD-10-CM | POA: Diagnosis not present

## 2021-10-14 DIAGNOSIS — M86172 Other acute osteomyelitis, left ankle and foot: Secondary | ICD-10-CM | POA: Diagnosis present

## 2021-10-14 DIAGNOSIS — I872 Venous insufficiency (chronic) (peripheral): Secondary | ICD-10-CM | POA: Diagnosis not present

## 2021-10-14 DIAGNOSIS — Z20822 Contact with and (suspected) exposure to covid-19: Secondary | ICD-10-CM | POA: Diagnosis present

## 2021-10-14 DIAGNOSIS — R0600 Dyspnea, unspecified: Secondary | ICD-10-CM

## 2021-10-14 DIAGNOSIS — Z8616 Personal history of COVID-19: Secondary | ICD-10-CM | POA: Diagnosis not present

## 2021-10-14 DIAGNOSIS — E1165 Type 2 diabetes mellitus with hyperglycemia: Secondary | ICD-10-CM | POA: Diagnosis not present

## 2021-10-14 DIAGNOSIS — I69351 Hemiplegia and hemiparesis following cerebral infarction affecting right dominant side: Secondary | ICD-10-CM | POA: Diagnosis not present

## 2021-10-14 DIAGNOSIS — Z7902 Long term (current) use of antithrombotics/antiplatelets: Secondary | ICD-10-CM

## 2021-10-14 DIAGNOSIS — M109 Gout, unspecified: Secondary | ICD-10-CM | POA: Diagnosis present

## 2021-10-14 DIAGNOSIS — L97822 Non-pressure chronic ulcer of other part of left lower leg with fat layer exposed: Secondary | ICD-10-CM | POA: Diagnosis not present

## 2021-10-14 DIAGNOSIS — Z823 Family history of stroke: Secondary | ICD-10-CM

## 2021-10-14 DIAGNOSIS — D124 Benign neoplasm of descending colon: Secondary | ICD-10-CM | POA: Diagnosis not present

## 2021-10-14 DIAGNOSIS — I739 Peripheral vascular disease, unspecified: Secondary | ICD-10-CM | POA: Diagnosis not present

## 2021-10-14 DIAGNOSIS — Z789 Other specified health status: Secondary | ICD-10-CM

## 2021-10-14 DIAGNOSIS — L89312 Pressure ulcer of right buttock, stage 2: Secondary | ICD-10-CM | POA: Diagnosis not present

## 2021-10-14 DIAGNOSIS — Z4781 Encounter for orthopedic aftercare following surgical amputation: Secondary | ICD-10-CM | POA: Diagnosis present

## 2021-10-14 DIAGNOSIS — K2970 Gastritis, unspecified, without bleeding: Secondary | ICD-10-CM | POA: Diagnosis not present

## 2021-10-14 DIAGNOSIS — Z79899 Other long term (current) drug therapy: Secondary | ICD-10-CM

## 2021-10-14 DIAGNOSIS — I83018 Varicose veins of right lower extremity with ulcer other part of lower leg: Secondary | ICD-10-CM | POA: Diagnosis present

## 2021-10-14 DIAGNOSIS — Z8679 Personal history of other diseases of the circulatory system: Secondary | ICD-10-CM | POA: Diagnosis not present

## 2021-10-14 DIAGNOSIS — E119 Type 2 diabetes mellitus without complications: Secondary | ICD-10-CM | POA: Diagnosis not present

## 2021-10-14 DIAGNOSIS — L97919 Non-pressure chronic ulcer of unspecified part of right lower leg with unspecified severity: Secondary | ICD-10-CM | POA: Diagnosis present

## 2021-10-14 DIAGNOSIS — D509 Iron deficiency anemia, unspecified: Secondary | ICD-10-CM | POA: Diagnosis not present

## 2021-10-14 DIAGNOSIS — E785 Hyperlipidemia, unspecified: Secondary | ICD-10-CM | POA: Diagnosis present

## 2021-10-14 DIAGNOSIS — Z6841 Body Mass Index (BMI) 40.0 and over, adult: Secondary | ICD-10-CM

## 2021-10-14 DIAGNOSIS — D500 Iron deficiency anemia secondary to blood loss (chronic): Secondary | ICD-10-CM | POA: Diagnosis not present

## 2021-10-14 DIAGNOSIS — Z7409 Other reduced mobility: Secondary | ICD-10-CM

## 2021-10-14 LAB — CBC WITH DIFFERENTIAL/PLATELET
Abs Immature Granulocytes: 0.16 10*3/uL — ABNORMAL HIGH (ref 0.00–0.07)
Basophils Absolute: 0 10*3/uL (ref 0.0–0.1)
Basophils Relative: 0 %
Eosinophils Absolute: 0.4 10*3/uL (ref 0.0–0.5)
Eosinophils Relative: 3 %
HCT: 32.6 % — ABNORMAL LOW (ref 39.0–52.0)
Hemoglobin: 9.4 g/dL — ABNORMAL LOW (ref 13.0–17.0)
Immature Granulocytes: 1 %
Lymphocytes Relative: 6 %
Lymphs Abs: 0.7 10*3/uL (ref 0.7–4.0)
MCH: 22.8 pg — ABNORMAL LOW (ref 26.0–34.0)
MCHC: 28.8 g/dL — ABNORMAL LOW (ref 30.0–36.0)
MCV: 79.1 fL — ABNORMAL LOW (ref 80.0–100.0)
Monocytes Absolute: 1.1 10*3/uL — ABNORMAL HIGH (ref 0.1–1.0)
Monocytes Relative: 9 %
Neutro Abs: 9.6 10*3/uL — ABNORMAL HIGH (ref 1.7–7.7)
Neutrophils Relative %: 81 %
Platelets: 305 10*3/uL (ref 150–400)
RBC: 4.12 MIL/uL — ABNORMAL LOW (ref 4.22–5.81)
RDW: 19.3 % — ABNORMAL HIGH (ref 11.5–15.5)
WBC: 12 10*3/uL — ABNORMAL HIGH (ref 4.0–10.5)
nRBC: 0 % (ref 0.0–0.2)

## 2021-10-14 LAB — COMPREHENSIVE METABOLIC PANEL
ALT: 14 U/L (ref 0–44)
AST: 11 U/L — ABNORMAL LOW (ref 15–41)
Albumin: 2.5 g/dL — ABNORMAL LOW (ref 3.5–5.0)
Alkaline Phosphatase: 51 U/L (ref 38–126)
Anion gap: 9 (ref 5–15)
BUN: 71 mg/dL — ABNORMAL HIGH (ref 8–23)
CO2: 21 mmol/L — ABNORMAL LOW (ref 22–32)
Calcium: 8.4 mg/dL — ABNORMAL LOW (ref 8.9–10.3)
Chloride: 103 mmol/L (ref 98–111)
Creatinine, Ser: 1.74 mg/dL — ABNORMAL HIGH (ref 0.61–1.24)
GFR, Estimated: 43 mL/min — ABNORMAL LOW (ref >60)
Glucose, Bld: 151 mg/dL — ABNORMAL HIGH (ref 70–99)
Potassium: 4 mmol/L (ref 3.5–5.1)
Sodium: 133 mmol/L — ABNORMAL LOW (ref 135–145)
Total Bilirubin: 0.4 mg/dL (ref 0.3–1.2)
Total Protein: 7.1 g/dL (ref 6.5–8.1)

## 2021-10-14 LAB — POC OCCULT BLOOD, ED: Fecal Occult Bld: POSITIVE — AB

## 2021-10-14 LAB — LACTIC ACID, PLASMA: Lactic Acid, Venous: 1.4 mmol/L (ref 0.5–1.9)

## 2021-10-14 NOTE — ED Triage Notes (Signed)
Ems brings pt in from home for left leg pain. States he has had this leg pain for a month. No new injury.

## 2021-10-14 NOTE — ED Provider Notes (Signed)
[facility] DEPT Provider Note   CSN: [id number] Arrival date & time: 10/14/21  1757     History  Chief Complaint  Patient presents with   Leg Pain    [name] [name] is a 66 y.o. male.  Medical history includes morbid obesity, venous stasis ulcers, CHF, hypertension.  Patient presents emergency department with left ankle pain.  Apparently on December 10, he was getting into the car and he turned his left ankle abnormally.  Ever since then, patient has been unable to bear weight on his left ankle.  He has previously been able to be weightbearing with a cane.  He has been seen by [facility] and urgent care, and he has had negative x-rays.  He apparently has had chronic venous stasis ulcer wounds on bilateral lower extremities.  He is followed by wound care and has regular dressing changes.  According to patient, wounds have not changed drastically recently.   Leg Pain Associated symptoms: no fever       Home Medications Prior to Admission medications   Medication Sig Start Date End Date Taking? Authorizing Provider  acetaminophen (TYLENOL) 500 MG tablet Take 1,000 mg by mouth daily as needed for moderate pain.    [provider]  allopurinol (ZYLOPRIM) 300 MG tablet Take 300 mg by mouth daily.    [provider]  aspirin 325 MG tablet Take 325 mg by mouth daily.    [provider]  aspirin EC 81 MG tablet Take 1 tablet (81 mg total) by mouth daily. Patient not taking: No sig reported 05/10/18   Satira Sark, MD  atorvastatin (LIPITOR) 10 MG tablet Take 10 mg by mouth daily.    [provider]  clopidogrel (PLAVIX) 75 MG tablet Take 75 mg by mouth daily.    [provider]  furosemide (LASIX) 40 MG tablet Take 40 mg by mouth every other day.    [provider]  hydrALAZINE (APRESOLINE) 50 MG tablet TAKE 1 TABLET BY MOUTH TWICE A DAY. NEED OFFICE VISIT FOR FURTHER REFILLS Patient taking  differently: Take 50 mg by mouth 3 (three) times daily. Increased by Dr. Quintin Alto 03/08/18   Satira Sark, MD  ibuprofen (ADVIL,MOTRIN) 200 MG tablet Take 400-600 mg by mouth daily as needed for mild pain (pain).    [provider]  metoprolol succinate (TOPROL-XL) 100 MG 24 hr tablet Take 100 mg by mouth daily. Take with or immediately following a meal.    [provider]  olmesartan-hydrochlorothiazide (BENICAR HCT) 40-25 MG tablet Take 1 tablet by mouth daily. 04/22/18   [provider]      Allergies    Patient has no known allergies.    Review of Systems   Review of Systems  Constitutional:  Negative for chills and fever.  Musculoskeletal:  Positive for arthralgias.  Skin:  Positive for wound.  All other systems reviewed and are negative.  Physical Exam Updated Vital Signs BP (!) 157/73    Pulse 79    Temp (!) 97.4 F (36.3 C) (Oral)    Resp (!) 26    Ht 5\' 10"  (1.778 m)    Wt (!) 158.8 kg    SpO2 96%    BMI 50.22 kg/m  Physical Exam Vitals and nursing note reviewed.  Constitutional:      General: He is not in acute distress.    Appearance: Normal appearance. He is obese. He is not ill-appearing, toxic-appearing or diaphoretic.  HENT:  Head: Normocephalic and atraumatic.     Nose: No nasal deformity.     Mouth/Throat:     Lips: Pink. No lesions.     Mouth: No injury, lacerations, oral lesions or angioedema.     Pharynx: Uvula midline. No pharyngeal swelling or uvula swelling.  Eyes:     General: Gaze aligned appropriately. No scleral icterus.       Right eye: No discharge.        Left eye: No discharge.     Conjunctiva/sclera: Conjunctivae normal.     Right eye: Right conjunctiva is not injected. No exudate or hemorrhage.    Left eye: Left conjunctiva is not injected. No exudate or hemorrhage. Cardiovascular:     Rate and Rhythm: Normal rate and regular rhythm.     Pulses: Normal pulses.          Radial pulses are 2+ on the right side and  2+ on the left side.       Dorsalis pedis pulses are 2+ on the right side and 2+ on the left side.     Heart sounds: Normal heart sounds, S1 normal and S2 normal. Heart sounds not distant. No murmur heard.   No friction rub. No gallop. No S3 or S4 sounds.  Pulmonary:     Effort: Pulmonary effort is normal. No accessory muscle usage or respiratory distress.     Breath sounds: Normal breath sounds. No stridor. No wheezing, rhonchi or rales.  Chest:     Chest wall: No tenderness.  Abdominal:     General: Abdomen is flat. Bowel sounds are normal. There is no distension.     Palpations: Abdomen is soft. There is no mass or pulsatile mass.     Tenderness: There is no abdominal tenderness. There is no guarding or rebound.  Musculoskeletal:     Right lower leg: Edema present.     Left lower leg: Edema present.     Comments: Left ankle with minimal range of motion.  Pedal pulses 2+.  Normal sensation.  Skin:    General: Skin is warm and dry.     Coloration: Skin is not jaundiced or pale.     Findings: Wound present. No bruising, erythema, lesion or rash.     Comments: Venous stasis ulcer wounds of left lower leg.  They are circumferential and weeping clear fluid.  See pictures below  Neurological:     General: No focal deficit present.     Mental Status: He is alert and oriented to person, place, and time.     GCS: GCS eye subscore is 4. GCS verbal subscore is 5. GCS motor subscore is 6.  Psychiatric:        Mood and Affect: Mood normal.        Behavior: Behavior normal. Behavior is cooperative.             ED Results / Procedures / Treatments   Labs (all labs ordered are listed, but only abnormal results are displayed) Labs Reviewed  COMPREHENSIVE METABOLIC PANEL - Abnormal; Notable for the following components:      Result Value   Sodium 133 (*)    CO2 21 (*)    Glucose, Bld 151 (*)    BUN 71 (*)    Creatinine, Ser 1.74 (*)    Calcium 8.4 (*)    Albumin 2.5 (*)    AST 11  (*)    GFR, Estimated 43 (*)    All other components within  normal limits  CBC WITH DIFFERENTIAL/PLATELET - Abnormal; Notable for the following components:   WBC 12.0 (*)    RBC 4.12 (*)    Hemoglobin 9.4 (*)    HCT 32.6 (*)    MCV 79.1 (*)    MCH 22.8 (*)    MCHC 28.8 (*)    RDW 19.3 (*)    Neutro Abs 9.6 (*)    Monocytes Absolute 1.1 (*)    Abs Immature Granulocytes 0.16 (*)    All other components within normal limits  LACTIC ACID, PLASMA  LACTIC ACID, PLASMA    EKG None  Radiology DG Ankle Complete Left  Result Date: 10/14/2021 CLINICAL DATA:  Ankle pain for 1 month EXAM: LEFT ANKLE COMPLETE - 3+ VIEW COMPARISON:  None. FINDINGS: Frontal, oblique, and lateral views of the left ankle are obtained. Evaluation is limited due to patient positioning and cooperation. No acute fracture, subluxation, or dislocation. There is severe osteoarthritis of the tibiotalar joint and subtalar joints. Mild osteoarthritis of the midfoot. Prominent superior and inferior calcaneal spurs, with calcification in the plantar fascia. Diffuse soft tissue edema. IMPRESSION: 1. 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 2. Diffuse soft tissue swelling. 3. No acute fracture. Electronically Signed   By: Randa Ngo M.D.   On: 10/14/2021 21:09    Procedures Procedures    Medications Ordered in ED Medications - No data to display  ED Course/ Medical Decision Making/ A&P                           Medical Decision Making Amount and/or Complexity of Data Reviewed Independent Historian:     Details: independent historian, accompanied by sibling External Data Reviewed: labs, radiology and notes.    Details: Reviewed previous imaging of foot, previous wound care notes, previous ED photoes from chronic wounds Labs: ordered. Decision-making details documented in ED Course. Radiology: ordered and independent interpretation performed. Decision-making details documented in ED Course.   This is a  67 y.o. male with a PMH of morbid obesity, venous stasis ulcers, CHF, hypertension who presents to the ED with left ankle pain for 1.5 months.  He is found to have ulcerated lesions on his left lower extremity that appear to have worsened from prior images that we have in the chart.  There is clear weepage.  Pulses are intact.  I discussed this case with my attending physician, Dr. Langston Masker who has seen and evaluated this patient. Patient has worsening functional status and is not ambulatory at this point. He lives alone and does not have much support at home. We will reimage ankle and obtain labs to assess for other medical complications. Patient has normal vitals and does not appear septic. The left leg ulcerations do look worse compared to prior images, however, I am not convinced that these wounds are infected. Will view labs and reevaluate. Patient is open to the idea of possible placement. He will likely require PT eval at the very least prior to sending home.  10:29 PM Care of [facility] transferred to [facility] and Dr. Langston Masker at the end of my shift as the patient will require reassessment once labs/imaging have resulted. Patient presentation, ED course, and plan of care discussed with review of all pertinent labs and imaging. Please see his/her note for further details regarding further ED course and disposition. Plan at time of handoff is f/u on labs. Will likely need PT evaluation and transition to  care consult if patient does not meet admission requirements. This may be altered or completely changed at the discretion of the oncoming team pending results of further workup.  Portions of this note were generated with [employer]. Dictation errors may occur despite best attempts at proofreading.   Final Clinical Impression(s) / ED Diagnoses Final diagnoses:  Left leg pain    Rx / DC Orders ED Discharge Orders     None         Adolphus Birchwood, PA-C 10/14/21 2229     Wyvonnia Dusky, MD 10/14/21 2350

## 2021-10-14 NOTE — ED Provider Notes (Signed)
Clinical Course as of 10/15/21 0004  [name] [name] Oct 14, 2021  2200 Care assumed at shift change from [place], [place]. Pending labs and reassessment. Coming in today for worsening BLE wounds with decline in ADLs.  Patient reports that he has been nonambulatory for the past 1.5 months due to L ankle sprain. [KH]  2213 On review of labs, patient appears to have evidence of acute kidney injury.  Creatinine today is 1.74 with BUN of 71; up from 1.3 and 27, respectively.  The patient also has a new anemia today.  Hemoglobin of 9.4 is approximately a 3 to 4 g drop from apparent baseline.  Microcytic anemia suggestive of chronic change, though will obtain Hemoccult to assure no signs of acute/emergent blood loss.  His vitals have been stable.  He may have a degree of anemia associated with his kidney dysfunction.  Initial lactate normal at 1.4.  Second lactate canceled. [KH]  2328 Hemoccult is positive, though stool is brown in color.  There is a stage I sacral decubitus ulcer.  Open nature of this wound may be impacting Hemoccult results.  Still, with newer anemia, will proceed with admission for hemoglobin trending.  Patient would also benefit from PT/OT evaluation while inpatient.  These orders have been placed. [KH]  Sat Oct 15, 2021  0003 Case discussed with Dr. Flossie Buffy of [facility] who will assess the patient in the ED for admission. [KH]    Clinical Course User Index [KH] Antonietta Breach, PA-C    Results for orders placed or performed during the hospital encounter of 10/14/21  Comprehensive metabolic panel  Result Value Ref Range   Sodium 133 (L) 135 - 145 mmol/L   Potassium 4.0 3.5 - 5.1 mmol/L   Chloride 103 98 - 111 mmol/L   CO2 21 (L) 22 - 32 mmol/L   Glucose, Bld 151 (H) 70 - 99 mg/dL   BUN 71 (H) 8 - 23 mg/dL   Creatinine, Ser 1.74 (H) 0.61 - 1.24 mg/dL   Calcium 8.4 (L) 8.9 - 10.3 mg/dL   Total Protein 7.1 6.5 - 8.1 g/dL   Albumin 2.5 (L) 3.5 - 5.0 g/dL   AST 11 (L) 15 - 41 U/L   ALT 14 0 - 44 U/L    Alkaline Phosphatase 51 38 - 126 U/L   Total Bilirubin 0.4 0.3 - 1.2 mg/dL   GFR, Estimated 43 (L) >60 mL/min   Anion gap 9 5 - 15  CBC with Differential  Result Value Ref Range   WBC 12.0 (H) 4.0 - 10.5 K/uL   RBC 4.12 (L) 4.22 - 5.81 MIL/uL   Hemoglobin 9.4 (L) 13.0 - 17.0 g/dL   HCT 32.6 (L) 39.0 - 52.0 %   MCV 79.1 (L) 80.0 - 100.0 fL   MCH 22.8 (L) 26.0 - 34.0 pg   MCHC 28.8 (L) 30.0 - 36.0 g/dL   RDW 19.3 (H) 11.5 - 15.5 %   Platelets 305 150 - 400 K/uL   nRBC 0.0 0.0 - 0.2 %   Neutrophils Relative % 81 %   Neutro Abs 9.6 (H) 1.7 - 7.7 K/uL   Lymphocytes Relative 6 %   Lymphs Abs 0.7 0.7 - 4.0 K/uL   Monocytes Relative 9 %   Monocytes Absolute 1.1 (H) 0.1 - 1.0 K/uL   Eosinophils Relative 3 %   Eosinophils Absolute 0.4 0.0 - 0.5 K/uL   Basophils Relative 0 %   Basophils Absolute 0.0 0.0 - 0.1 K/uL   Immature Granulocytes 1 %  Abs Immature Granulocytes 0.16 (H) 0.00 - 0.07 K/uL  Lactic acid, plasma  Result Value Ref Range   Lactic Acid, Venous 1.4 0.5 - 1.9 mmol/L  POC occult blood, ED RN will collect  Result Value Ref Range   Fecal Occult Bld POSITIVE (A) NEGATIVE   DG Ankle Complete Left  Result Date: 10/14/2021 CLINICAL DATA:  Ankle pain for 1 month EXAM: LEFT ANKLE COMPLETE - 3+ VIEW COMPARISON:  None. FINDINGS: Frontal, oblique, and lateral views of the left ankle are obtained. Evaluation is limited due to patient positioning and cooperation. No acute fracture, subluxation, or dislocation. There is severe osteoarthritis of the tibiotalar joint and subtalar joints. Mild osteoarthritis of the midfoot. Prominent superior and inferior calcaneal spurs, with calcification in the plantar fascia. Diffuse soft tissue edema. IMPRESSION: 1. 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 2. Diffuse soft tissue swelling. 3. No acute fracture. Electronically Signed   By: Randa Ngo M.D.   On: 10/14/2021 21:09      Antonietta Breach, PA-C 10/15/21 0004    Ezequiel Essex, MD 10/15/21 [phone]

## 2021-10-15 ENCOUNTER — Encounter (HOSPITAL_COMMUNITY): Payer: Self-pay | Admitting: Internal Medicine

## 2021-10-15 ENCOUNTER — Observation Stay (HOSPITAL_COMMUNITY): Payer: Medicare Other

## 2021-10-15 ENCOUNTER — Other Ambulatory Visit: Payer: Self-pay

## 2021-10-15 DIAGNOSIS — I70202 Unspecified atherosclerosis of native arteries of extremities, left leg: Secondary | ICD-10-CM | POA: Diagnosis not present

## 2021-10-15 DIAGNOSIS — E875 Hyperkalemia: Secondary | ICD-10-CM | POA: Diagnosis not present

## 2021-10-15 DIAGNOSIS — K635 Polyp of colon: Secondary | ICD-10-CM | POA: Diagnosis present

## 2021-10-15 DIAGNOSIS — Z8673 Personal history of transient ischemic attack (TIA), and cerebral infarction without residual deficits: Secondary | ICD-10-CM | POA: Diagnosis not present

## 2021-10-15 DIAGNOSIS — Z20822 Contact with and (suspected) exposure to covid-19: Secondary | ICD-10-CM | POA: Diagnosis present

## 2021-10-15 DIAGNOSIS — M79605 Pain in left leg: Secondary | ICD-10-CM | POA: Diagnosis not present

## 2021-10-15 DIAGNOSIS — I878 Other specified disorders of veins: Secondary | ICD-10-CM | POA: Diagnosis present

## 2021-10-15 DIAGNOSIS — E1169 Type 2 diabetes mellitus with other specified complication: Secondary | ICD-10-CM | POA: Diagnosis present

## 2021-10-15 DIAGNOSIS — E119 Type 2 diabetes mellitus without complications: Secondary | ICD-10-CM | POA: Diagnosis not present

## 2021-10-15 DIAGNOSIS — L97929 Non-pressure chronic ulcer of unspecified part of left lower leg with unspecified severity: Secondary | ICD-10-CM | POA: Diagnosis not present

## 2021-10-15 DIAGNOSIS — D649 Anemia, unspecified: Secondary | ICD-10-CM | POA: Diagnosis not present

## 2021-10-15 DIAGNOSIS — M009 Pyogenic arthritis, unspecified: Secondary | ICD-10-CM | POA: Diagnosis present

## 2021-10-15 DIAGNOSIS — M7989 Other specified soft tissue disorders: Secondary | ICD-10-CM | POA: Diagnosis present

## 2021-10-15 DIAGNOSIS — I5033 Acute on chronic diastolic (congestive) heart failure: Secondary | ICD-10-CM | POA: Diagnosis present

## 2021-10-15 DIAGNOSIS — G4733 Obstructive sleep apnea (adult) (pediatric): Secondary | ICD-10-CM | POA: Diagnosis present

## 2021-10-15 DIAGNOSIS — I517 Cardiomegaly: Secondary | ICD-10-CM | POA: Diagnosis not present

## 2021-10-15 DIAGNOSIS — I83018 Varicose veins of right lower extremity with ulcer other part of lower leg: Secondary | ICD-10-CM | POA: Diagnosis present

## 2021-10-15 DIAGNOSIS — Z8616 Personal history of COVID-19: Secondary | ICD-10-CM | POA: Diagnosis not present

## 2021-10-15 DIAGNOSIS — M109 Gout, unspecified: Secondary | ICD-10-CM | POA: Diagnosis present

## 2021-10-15 DIAGNOSIS — M869 Osteomyelitis, unspecified: Secondary | ICD-10-CM

## 2021-10-15 DIAGNOSIS — K254 Chronic or unspecified gastric ulcer with hemorrhage: Secondary | ICD-10-CM | POA: Diagnosis present

## 2021-10-15 DIAGNOSIS — Z89512 Acquired absence of left leg below knee: Secondary | ICD-10-CM | POA: Diagnosis not present

## 2021-10-15 DIAGNOSIS — D62 Acute posthemorrhagic anemia: Secondary | ICD-10-CM | POA: Diagnosis present

## 2021-10-15 DIAGNOSIS — L03116 Cellulitis of left lower limb: Secondary | ICD-10-CM | POA: Diagnosis present

## 2021-10-15 DIAGNOSIS — L89322 Pressure ulcer of left buttock, stage 2: Secondary | ICD-10-CM | POA: Diagnosis present

## 2021-10-15 DIAGNOSIS — I69351 Hemiplegia and hemiparesis following cerebral infarction affecting right dominant side: Secondary | ICD-10-CM | POA: Diagnosis not present

## 2021-10-15 DIAGNOSIS — E669 Obesity, unspecified: Secondary | ICD-10-CM | POA: Diagnosis not present

## 2021-10-15 DIAGNOSIS — Z4781 Encounter for orthopedic aftercare following surgical amputation: Secondary | ICD-10-CM | POA: Diagnosis present

## 2021-10-15 DIAGNOSIS — I110 Hypertensive heart disease with heart failure: Secondary | ICD-10-CM | POA: Diagnosis present

## 2021-10-15 DIAGNOSIS — K921 Melena: Secondary | ICD-10-CM | POA: Diagnosis not present

## 2021-10-15 DIAGNOSIS — K633 Ulcer of intestine: Secondary | ICD-10-CM | POA: Diagnosis not present

## 2021-10-15 DIAGNOSIS — I509 Heart failure, unspecified: Secondary | ICD-10-CM | POA: Diagnosis not present

## 2021-10-15 DIAGNOSIS — Z833 Family history of diabetes mellitus: Secondary | ICD-10-CM | POA: Diagnosis not present

## 2021-10-15 DIAGNOSIS — D500 Iron deficiency anemia secondary to blood loss (chronic): Secondary | ICD-10-CM | POA: Diagnosis not present

## 2021-10-15 DIAGNOSIS — L0390 Cellulitis, unspecified: Secondary | ICD-10-CM | POA: Diagnosis present

## 2021-10-15 DIAGNOSIS — G546 Phantom limb syndrome with pain: Secondary | ICD-10-CM | POA: Diagnosis present

## 2021-10-15 DIAGNOSIS — Z6841 Body Mass Index (BMI) 40.0 and over, adult: Secondary | ICD-10-CM | POA: Diagnosis not present

## 2021-10-15 DIAGNOSIS — I739 Peripheral vascular disease, unspecified: Secondary | ICD-10-CM | POA: Diagnosis not present

## 2021-10-15 DIAGNOSIS — M86172 Other acute osteomyelitis, left ankle and foot: Secondary | ICD-10-CM | POA: Diagnosis present

## 2021-10-15 DIAGNOSIS — Z823 Family history of stroke: Secondary | ICD-10-CM | POA: Diagnosis not present

## 2021-10-15 DIAGNOSIS — K2951 Unspecified chronic gastritis with bleeding: Secondary | ICD-10-CM | POA: Diagnosis present

## 2021-10-15 DIAGNOSIS — G0491 Myelitis, unspecified: Secondary | ICD-10-CM | POA: Diagnosis present

## 2021-10-15 DIAGNOSIS — N281 Cyst of kidney, acquired: Secondary | ICD-10-CM | POA: Diagnosis not present

## 2021-10-15 DIAGNOSIS — Q666 Other congenital valgus deformities of feet: Secondary | ICD-10-CM | POA: Diagnosis not present

## 2021-10-15 DIAGNOSIS — I10 Essential (primary) hypertension: Secondary | ICD-10-CM | POA: Diagnosis not present

## 2021-10-15 DIAGNOSIS — N5089 Other specified disorders of the male genital organs: Secondary | ICD-10-CM | POA: Diagnosis present

## 2021-10-15 DIAGNOSIS — L8990 Pressure ulcer of unspecified site, unspecified stage: Secondary | ICD-10-CM | POA: Diagnosis present

## 2021-10-15 DIAGNOSIS — R0602 Shortness of breath: Secondary | ICD-10-CM | POA: Diagnosis not present

## 2021-10-15 DIAGNOSIS — N179 Acute kidney failure, unspecified: Secondary | ICD-10-CM | POA: Diagnosis present

## 2021-10-15 DIAGNOSIS — K2970 Gastritis, unspecified, without bleeding: Secondary | ICD-10-CM | POA: Diagnosis not present

## 2021-10-15 DIAGNOSIS — K625 Hemorrhage of anus and rectum: Secondary | ICD-10-CM | POA: Diagnosis not present

## 2021-10-15 DIAGNOSIS — Z8249 Family history of ischemic heart disease and other diseases of the circulatory system: Secondary | ICD-10-CM | POA: Diagnosis not present

## 2021-10-15 DIAGNOSIS — I872 Venous insufficiency (chronic) (peripheral): Secondary | ICD-10-CM | POA: Diagnosis not present

## 2021-10-15 DIAGNOSIS — E1165 Type 2 diabetes mellitus with hyperglycemia: Secondary | ICD-10-CM | POA: Diagnosis not present

## 2021-10-15 DIAGNOSIS — L02416 Cutaneous abscess of left lower limb: Secondary | ICD-10-CM | POA: Diagnosis present

## 2021-10-15 DIAGNOSIS — E785 Hyperlipidemia, unspecified: Secondary | ICD-10-CM | POA: Diagnosis present

## 2021-10-15 DIAGNOSIS — L89312 Pressure ulcer of right buttock, stage 2: Secondary | ICD-10-CM | POA: Diagnosis present

## 2021-10-15 DIAGNOSIS — Z8679 Personal history of other diseases of the circulatory system: Secondary | ICD-10-CM | POA: Diagnosis not present

## 2021-10-15 DIAGNOSIS — R0600 Dyspnea, unspecified: Secondary | ICD-10-CM | POA: Diagnosis not present

## 2021-10-15 DIAGNOSIS — L97919 Non-pressure chronic ulcer of unspecified part of right lower leg with unspecified severity: Secondary | ICD-10-CM | POA: Diagnosis present

## 2021-10-15 DIAGNOSIS — L97819 Non-pressure chronic ulcer of other part of right lower leg with unspecified severity: Secondary | ICD-10-CM | POA: Diagnosis present

## 2021-10-15 DIAGNOSIS — D124 Benign neoplasm of descending colon: Secondary | ICD-10-CM | POA: Diagnosis not present

## 2021-10-15 DIAGNOSIS — R079 Chest pain, unspecified: Secondary | ICD-10-CM | POA: Diagnosis not present

## 2021-10-15 DIAGNOSIS — Z77090 Contact with and (suspected) exposure to asbestos: Secondary | ICD-10-CM | POA: Diagnosis present

## 2021-10-15 LAB — CBC
HCT: 31.5 % — ABNORMAL LOW (ref 39.0–52.0)
Hemoglobin: 9.5 g/dL — ABNORMAL LOW (ref 13.0–17.0)
MCH: 23.8 pg — ABNORMAL LOW (ref 26.0–34.0)
MCHC: 30.2 g/dL (ref 30.0–36.0)
MCV: 78.9 fL — ABNORMAL LOW (ref 80.0–100.0)
Platelets: 282 10*3/uL (ref 150–400)
RBC: 3.99 MIL/uL — ABNORMAL LOW (ref 4.22–5.81)
RDW: 19.3 % — ABNORMAL HIGH (ref 11.5–15.5)
WBC: 10.3 10*3/uL (ref 4.0–10.5)
nRBC: 0 % (ref 0.0–0.2)

## 2021-10-15 LAB — BASIC METABOLIC PANEL
Anion gap: 6 (ref 5–15)
BUN: 64 mg/dL — ABNORMAL HIGH (ref 8–23)
CO2: 23 mmol/L (ref 22–32)
Calcium: 8.3 mg/dL — ABNORMAL LOW (ref 8.9–10.3)
Chloride: 105 mmol/L (ref 98–111)
Creatinine, Ser: 1.68 mg/dL — ABNORMAL HIGH (ref 0.61–1.24)
GFR, Estimated: 45 mL/min — ABNORMAL LOW (ref >60)
Glucose, Bld: 215 mg/dL — ABNORMAL HIGH (ref 70–99)
Potassium: 3.8 mmol/L (ref 3.5–5.1)
Sodium: 134 mmol/L — ABNORMAL LOW (ref 135–145)

## 2021-10-15 LAB — RESP PANEL BY RT-PCR (FLU A&B, COVID) ARPGX2
Influenza A by PCR: NEGATIVE
Influenza B by PCR: NEGATIVE
SARS Coronavirus 2 by RT PCR: NEGATIVE

## 2021-10-15 LAB — HIV ANTIBODY (ROUTINE TESTING W REFLEX): HIV Screen 4th Generation wRfx: NONREACTIVE

## 2021-10-15 LAB — BRAIN NATRIURETIC PEPTIDE: B Natriuretic Peptide: 135.7 pg/mL — ABNORMAL HIGH (ref 0.0–100.0)

## 2021-10-15 MED ORDER — SODIUM CHLORIDE 0.9 % IV SOLN
2.0000 g | Freq: Once | INTRAVENOUS | Status: AC
  Administered 2021-10-15: 2 g via INTRAVENOUS
  Filled 2021-10-15: qty 2

## 2021-10-15 MED ORDER — HYDRALAZINE HCL 25 MG PO TABS
50.0000 mg | ORAL_TABLET | Freq: Once | ORAL | Status: AC
  Administered 2021-10-15: 50 mg via ORAL
  Filled 2021-10-15: qty 2

## 2021-10-15 MED ORDER — HYDRALAZINE HCL 50 MG PO TABS
50.0000 mg | ORAL_TABLET | Freq: Two times a day (BID) | ORAL | Status: DC
  Administered 2021-10-15 – 2021-10-23 (×14): 50 mg via ORAL
  Filled 2021-10-15 (×14): qty 1

## 2021-10-15 MED ORDER — ACETAMINOPHEN 650 MG RE SUPP: 650.0000 mg | Freq: Four times a day (QID) | RECTAL | Status: DC | PRN

## 2021-10-15 MED ORDER — SODIUM CHLORIDE 0.9 % IV SOLN
2.0000 g | Freq: Three times a day (TID) | INTRAVENOUS | Status: AC
  Administered 2021-10-15 – 2021-10-22 (×22): 2 g via INTRAVENOUS
  Filled 2021-10-15 (×23): qty 2

## 2021-10-15 MED ORDER — HYDROCODONE-ACETAMINOPHEN 5-325 MG PO TABS
1.0000 | ORAL_TABLET | Freq: Four times a day (QID) | ORAL | Status: DC | PRN
  Administered 2021-10-15 – 2021-10-20 (×9): 1 via ORAL
  Filled 2021-10-15 (×10): qty 1

## 2021-10-15 MED ORDER — ONDANSETRON HCL 4 MG PO TABS: 4.0000 mg | ORAL_TABLET | Freq: Four times a day (QID) | ORAL | Status: DC | PRN

## 2021-10-15 MED ORDER — ONDANSETRON HCL 4 MG/2ML IJ SOLN: 4.0000 mg | Freq: Four times a day (QID) | INTRAMUSCULAR | Status: DC | PRN

## 2021-10-15 MED ORDER — ACETAMINOPHEN 325 MG PO TABS: 650.0000 mg | ORAL_TABLET | Freq: Four times a day (QID) | ORAL | Status: DC | PRN

## 2021-10-15 MED ORDER — VANCOMYCIN HCL IN DEXTROSE 1-5 GM/200ML-% IV SOLN: 1000.0000 mg | Freq: Once | INTRAVENOUS | Status: DC

## 2021-10-15 MED ORDER — ALLOPURINOL 300 MG PO TABS
300.0000 mg | ORAL_TABLET | Freq: Every day | ORAL | Status: DC
  Administered 2021-10-15 – 2021-10-25 (×10): 300 mg via ORAL
  Filled 2021-10-15 (×10): qty 1

## 2021-10-15 MED ORDER — LACTATED RINGERS IV BOLUS
500.0000 mL | Freq: Once | INTRAVENOUS | Status: AC
  Administered 2021-10-15: 500 mL via INTRAVENOUS

## 2021-10-15 MED ORDER — ATORVASTATIN CALCIUM 10 MG PO TABS
10.0000 mg | ORAL_TABLET | Freq: Every day | ORAL | Status: DC
  Administered 2021-10-15 – 2021-10-25 (×10): 10 mg via ORAL
  Filled 2021-10-15 (×10): qty 1

## 2021-10-15 MED ORDER — METOPROLOL SUCCINATE ER 100 MG PO TB24
100.0000 mg | ORAL_TABLET | Freq: Every day | ORAL | Status: DC
  Administered 2021-10-16 – 2021-10-22 (×7): 100 mg via ORAL
  Filled 2021-10-15 (×7): qty 1

## 2021-10-15 MED ORDER — DOCUSATE SODIUM 100 MG PO CAPS
100.0000 mg | ORAL_CAPSULE | Freq: Two times a day (BID) | ORAL | Status: DC
  Administered 2021-10-15 – 2021-10-17 (×4): 100 mg via ORAL
  Filled 2021-10-15 (×7): qty 1

## 2021-10-15 MED ORDER — VANCOMYCIN HCL 2000 MG/400ML IV SOLN
2000.0000 mg | Freq: Once | INTRAVENOUS | Status: AC
  Administered 2021-10-15: 2000 mg via INTRAVENOUS
  Filled 2021-10-15: qty 400

## 2021-10-15 MED ORDER — GADOBUTROL 1 MMOL/ML IV SOLN: 10.0000 mL | Freq: Once | INTRAVENOUS | Status: DC | PRN

## 2021-10-15 MED ORDER — HYDROMORPHONE HCL 1 MG/ML IJ SOLN
0.5000 mg | INTRAMUSCULAR | Status: DC | PRN
  Administered 2021-10-15 – 2021-10-19 (×3): 0.5 mg via INTRAVENOUS
  Filled 2021-10-15 (×4): qty 0.5

## 2021-10-15 MED ORDER — VANCOMYCIN HCL 10 G IV SOLR
2500.0000 mg | INTRAVENOUS | Status: DC
  Administered 2021-10-16: 2500 mg via INTRAVENOUS
  Filled 2021-10-15: qty 2500

## 2021-10-15 NOTE — Hospital Course (Signed)
[name] [name] is a 66 y.o. male with medical history significant for chronic venous stasis, lymphedema, CVA, asbestosis, hypertension, OSA not on CPAP, who presents with concerns of worsening left ankle pain. 08/27/2021 feels he rolled his ankle when standing. 09/15/2021 seen by orthopedics, x-ray performed.  Diagnosed to have a sprain and ankle 12/28 seen by wound care at which time patient complains that he is barely able to walk. 1/27 presents to ER with worsening left leg pain and redness.  Admitted for cellulitis.  MRI ordered. 1/28 MRI positive for osteomyelitis of the ankle joint as well as hindfoot and septic arthritis.  Orthopedic consulted.  Transfer process initiated for [employer].

## 2021-10-15 NOTE — Progress Notes (Signed)
Progress Note   Patient: [name] [name] HFW:[id number] DOB: 14-Feb-1956 DOA: 10/14/2021     0 DOS: the patient was seen and examined on 10/15/2021   Brief hospital course: NASEEM VARDEN is a 66 y.o. male with medical history significant for chronic venous stasis, lymphedema, CVA, asbestosis, hypertension, OSA not on CPAP, who presents with concerns of worsening left ankle pain. 08/27/2021 feels he rolled his ankle when standing. 09/15/2021 seen by orthopedics, x-ray performed.  Diagnosed to have a sprain and ankle 12/28 seen by wound care at which time patient complains that he is barely able to walk. 1/27 presents to ER with worsening left leg pain and redness.  Admitted for cellulitis.  MRI ordered. 1/28 MRI positive for osteomyelitis of the ankle joint as well as hindfoot and septic arthritis.  Orthopedic consulted.  Transfer process initiated for [employer].   Assessment and Plan * Osteomyelitis of ankle and foot ([place])- (present on admission) Septic arthritis Presented at worsening pain on the left leg. Has chronic venous stasis as well as severe lymphedema. Left leg has weeping ulcers with purulent and necrotic areas. Started on broad-spectrum IV antibiotics. MRI lower extremity confirms presence of osteomyelitis of the ankle and foot area as well as septic arthritis of the multiple ankle joints. Orthopedic consulted.  Patient recommended to be transferred to [facility]. Continue pain control.  Follow-up on cultures. Will require ID consultation.   AKI (acute kidney injury) ([place])- (present on admission) Baseline serum creatinine around 1.3.  On presentation serum creatinine 1.74.  Receiving IV fluid.  Monitor.  Chronic diastolic heart failure ([place])- (present on admission) Essential hypertension. Patient takes Lasix at home as needed.  Appears short of breath but tells me that this is his baseline. For now Lasix is on hold. Will continue Toprol-XL.  Morbid obesity  ([place])- (present on admission) Body mass index is 50.22 kg/m.  Placing the patient at high risk for poor outcome. CPAP nightly.  Anemia- (present on admission) Baseline hemoglobin around 13. On admission hemoglobin was 9.4.  With the concern with the anemia Hemoccult was performed which was positive although patient does have a sacral ulcer which might be confounding the results. Repeat hemoglobin remained stable.  Patient reportedly had some melena episode at home although no bleeding here. At present I am holding aspirin Plavix as well as pharmacological DVT prophylaxis but low threshold to resume them on 1/29 if hemoglobin remained stable. Continue PPI twice daily although I do not think that patient requires GI consult for now based on stable hemoglobin.  Pressure ulcer- (present on admission) Stage II bilateral buttock ulcer, present on admission. Wound care consulted. Low-air-loss mattress requested. Continue dressing changes.  Pressure Injury 10/15/21 Buttocks Right;Left Stage 2 -  Partial thickness loss of dermis presenting as a shallow open injury with a red, pink wound bed without slough. pt with multiple areas of stage ii pressure injury on both sides of the buttocks, also (Active)  10/15/21 1635  Location: Buttocks  Location Orientation: Right;Left  Staging: Stage 2 -  Partial thickness loss of dermis presenting as a shallow open injury with a red, pink wound bed without slough.  Wound Description (Comments): pt with multiple areas of stage ii pressure injury on both sides of the buttocks, also purple discoloration in area as well, sacral foam applied  Present on Admission: Yes     History of CVA (cerebrovascular accident) On aspirin and Plavix. Both currently on hold in the setting of concern for GI  bleed. Resume aspirin tomorrow if hemoglobin remained stable.     Subjective: Denies any acute complaint.  No nausea no vomiting.  No fever no chills.  Agreeable to go to  [facility].  Objective Vitals:   10/15/21 1231 10/15/21 1300 10/15/21 1835 10/15/21 2045  BP: (!) 172/83 (!) 146/78 (!) 164/84 140/81  Pulse: 70 69 84 90  Resp: 20 20 15 18   Temp: 98.3 F (36.8 C) 98.6 F (37 C) 98.9 F (37.2 C) 98.6 F (37 C)  TempSrc: Oral Oral Oral   SpO2: 96% 99% 99% 100%  Weight:      Height:        General: Appear in mild distress, no Rash; Oral Mucosa Clear, moist. no Abnormal Neck Mass Or lumps, Conjunctiva normal  Cardiovascular: S1 and S2 Present, no Murmur, Respiratory: good respiratory effort, Bilateral Air entry present and CTA, no Crackles, no wheezes Abdomen: Bowel Sound present, Soft and no tenderness Extremities: Bilateral chronic lymphedema Left leg ulceration with purulent discharge and necrotic areas.  Right leg erythema without any warmth or tenderness. Neurology: alert and oriented to time, place, and person affect appropriate. no new focal deficit Gait not checked due to patient safety concerns   Data Reviewed:  I have Reviewed nursing notes, Vitals, and Lab results since pt's last encounter. Pertinent lab results CBC shows stable hemoglobin BMP shows improving serum creatinine I have ordered test including CBC, BMP, iron studies, ESR and CRP I have ordered imaging studies echocardiogram. I have independently visualized and interpreted imaging chest x-ray which showed no congestion. I have reviewed the last note from wound care clinic, and discussed pt's care plan and test results with orthopedic surgery.   Family Communication: None at bedside  Disposition: Status is: Inpatient  Remains inpatient appropriate because: Osteomyelitis and septic arthritis will require long-term IV antibiotic plan as well as interventions.     Author: Berle Mull, MD 10/15/2021 9:25 PM  For on call review [email or web address].

## 2021-10-15 NOTE — Plan of Care (Signed)
°  Problem: Clinical Measurements: Goal: Respiratory complications will improve Outcome: Progressing   Problem: Activity: Goal: Risk for activity intolerance will decrease Outcome: Progressing   Problem: Coping: Goal: Level of anxiety will decrease Outcome: Progressing   Problem: Elimination: Goal: Will not experience complications related to bowel motility Outcome: Progressing   Problem: Safety: Goal: Ability to remain free from injury will improve Outcome: Progressing   Problem: Skin Integrity: Goal: Risk for impaired skin integrity will decrease Outcome: Progressing

## 2021-10-15 NOTE — Assessment & Plan Note (Signed)
Stage II bilateral buttock ulcer, present on admission. Wound care consulted. Low-air-loss mattress requested. Continue dressing changes.  Pressure Injury 10/15/21 Buttocks Right;Left Stage 2 -  Partial thickness loss of dermis presenting as a shallow open injury with a red, pink wound bed without slough. pt with multiple areas of stage ii pressure injury on both sides of the buttocks, also (Active)  10/15/21 1635  Location: Buttocks  Location Orientation: Right;Left  Staging: Stage 2 -  Partial thickness loss of dermis presenting as a shallow open injury with a red, pink wound bed without slough.  Wound Description (Comments): pt with multiple areas of stage ii pressure injury on both sides of the buttocks, also purple discoloration in area as well, sacral foam applied  Present on Admission: Yes

## 2021-10-15 NOTE — ED Notes (Signed)
Pt to MRI

## 2021-10-15 NOTE — Assessment & Plan Note (Signed)
On aspirin and Plavix. Both currently on hold in the setting of concern for GI bleed. Resume aspirin tomorrow if hemoglobin remained stable.

## 2021-10-15 NOTE — Progress Notes (Addendum)
PT stable on arrival to floor. No needs on arrival to floor. Vs and assessment performed. Rn will continue to monitor. Pt with bilat lower leg cellulitis. Pt right lower leg is wrapped with cobain and pt left lower leg is open to air with several oozing, bloody spots. Both legs remain swollen. Rn elevated both legs. Pt also has excoriated areas on his buttocks bilaterally as well. Rn will apply sacral foam to protect pt skin as well. Pt denies pain. Rn will continue to monitor.

## 2021-10-15 NOTE — ED Notes (Signed)
Pt placed on 2L of O2 via [place] due to saturations dropping to 89% on RA

## 2021-10-15 NOTE — Progress Notes (Signed)
Pharmacy Antibiotic Note  [name] [name] is a 66 y.o. male admitted on 10/14/2021 with medical history significant for chronic venous stasis, lymphedema, CVA, asbestosis, hypertension, OSA not on CPAP, who presents with concerns of worsening left ankle pain.Marland Kitchen  Pharmacy has been consulted to dose cefepime and vancomycin for cellulitis.    Plan: Vancomycin 2gm IV x 1 then 2500mg  q36h (AUC 522.3, Scr 1.74) Cefepime 2gm IV q8h Follow renal function and clinical course  Height: 5\' 10"  (177.8 cm) Weight: (!) 158.8 kg (350 lb) IBW/kg (Calculated) : 73  Temp (24hrs), Avg:97.4 F (36.3 C), Min:97.4 F (36.3 C), Max:97.4 F (36.3 C)  Recent Labs  Lab 10/14/21 2035  WBC 12.0*  CREATININE 1.74*  LATICACIDVEN 1.4    Estimated Creatinine Clearance: 63.4 mL/min (A) (by C-G formula based on SCr of 1.74 mg/dL (H)).    No Known Allergies  Antimicrobials this admission: 1/28 cefepime >> 1/28 vanc >>  Dose adjustments this admission:   Microbiology results:   Thank you for allowing pharmacy to be a part of this patients care.  Dolly Rias RPh 10/15/2021, 3:25 AM

## 2021-10-15 NOTE — Assessment & Plan Note (Signed)
Body mass index is 50.22 kg/m.  Placing the patient at high risk for poor outcome. CPAP nightly.

## 2021-10-15 NOTE — ED Notes (Signed)
Pt returned from MRI °

## 2021-10-15 NOTE — Assessment & Plan Note (Signed)
Baseline serum creatinine around 1.3.  On presentation serum creatinine 1.74.  Receiving IV fluid.  Monitor.

## 2021-10-15 NOTE — Assessment & Plan Note (Addendum)
Essential hypertension. Patient takes Lasix at home as needed.  Appears short of breath but tells me that this is his baseline. For now Lasix is on hold. Will continue Toprol-XL.

## 2021-10-15 NOTE — H&P (Signed)
History and Physical    [name] [name] FGH:[id number] DOB: March 28, 1956 DOA: 10/14/2021  PCP: Manon Hilding, MD  Patient coming from: Home  I have personally briefly reviewed patient's old medical records in [place]  Chief Complaint: worsening left ankle pain  HPI: [name] [name] is a 66 y.o. male with medical history significant for chronic venous stasis, lymphedema, CVA, asbestosis, hypertension, OSA not on CPAP, who presents with concerns of worsening left ankle pain.  States about a month ago he thinks he might have twisted his ankle while getting into his car. Since then has had persistent and worsening ankle pain. Used to ambulate with a cone but now no longer able to walk. He was referred to [facility] and had x-rays negative for fracture.  He was diagnosed with sprained ankle. Patient also has chronic venous stasis and lymphedema of bilateral lower extremity followed by wound care.  He presents today with extensive edema, venous stasis ulceration with purulent discharge, and necrotic ulcers on his left lower extremity.  He is a poor historian and was not able to tell me how this differs from his baseline. He denies any fever.  No nausea, vomiting or diarrhea.   In the ED, he was afebrile and hypertensive up to systolic 678L over 38B.  WBC of 12, hemoglobin of 9.4 from a prior of 13.3.  FOBT positive.  Reports he has been taking Voltaren for his ankle pain.  Also thinks home health nurse saw dark stool back in December.  Never had colonoscopy in the past since he refused them. Creatinine of 1.74, BUN of 71.  Review of Systems: All pertinent negatives and positives stated above in HPI  Social Pt's wife passed last year. Has girlfriend now that helps with his care. Second hand smoke from deceased wife who smokes 5 pack/day.  Denies alcohol illicit drug use. Previously worked for [employer] and is following pulmonology and [place] for asbestosis exposure. Past Medical  History:  Diagnosis Date   Asbestosis [facility])    Essential hypertension    History of nephrolithiasis    History of open leg wound    History of stroke July 2004   Obstructive sleep apnea    TIA (transient ischemic attack)    Varicose veins    Laser ablation of left great saphenous vein 2009    Past Surgical History:  Procedure Laterality Date   CYSTOSCOPY WITH RETROGRADE PYELOGRAM, URETEROSCOPY AND STENT PLACEMENT Right 08/02/2015   Procedure: [place], URETEROSCOPY , STONE EXTRACTION AND STENT PLACEMENT;  Surgeon: Cleon Gustin, MD;  Location: [facility];  Service: Urology;  Laterality: Right;   HOLMIUM LASER APPLICATION Right 01/75/1025   Procedure: HOLMIUM LASER APPLICATION;  Surgeon: Cleon Gustin, MD;  Location: [facility];  Service: Urology;  Laterality: Right;   KIDNEY STONE SURGERY     VASECTOMY  1984   VEIN SURGERY Left 06/2010   Laser ablation     reports that he has never smoked. He has never used smokeless tobacco. He reports that he does not drink alcohol and does not use drugs. Social History  No Known Allergies  Family History  Problem Relation Age of Onset   Bone cancer Mother    Hypertension Father    Pancreatic cancer Maternal Grandmother    Stroke Paternal Grandmother    Diabetes Paternal Grandfather    Heart attack Brother    Hypertension Brother    Heart attack Sister      Prior  to Admission medications   Medication Sig Start Date End Date Taking? Authorizing Provider  acetaminophen (TYLENOL) 500 MG tablet Take 1,000 mg by mouth daily as needed for moderate pain.    [provider]  allopurinol (ZYLOPRIM) 300 MG tablet Take 300 mg by mouth daily.    [provider]  aspirin 325 MG tablet Take 325 mg by mouth daily.    [provider]  aspirin EC 81 MG tablet Take 1 tablet (81 mg total) by mouth daily. Patient not taking: No sig reported 05/10/18   Satira Sark, MD  atorvastatin  (LIPITOR) 10 MG tablet Take 10 mg by mouth daily.    [provider]  clopidogrel (PLAVIX) 75 MG tablet Take 75 mg by mouth daily.    [provider]  furosemide (LASIX) 40 MG tablet Take 40 mg by mouth every other day.    [provider]  hydrALAZINE (APRESOLINE) 50 MG tablet TAKE 1 TABLET BY MOUTH TWICE A DAY. NEED OFFICE VISIT FOR FURTHER REFILLS Patient taking differently: Take 50 mg by mouth 3 (three) times daily. Increased by Dr. Quintin Alto 03/08/18   Satira Sark, MD  ibuprofen (ADVIL,MOTRIN) 200 MG tablet Take 400-600 mg by mouth daily as needed for mild pain (pain).    [provider]  metoprolol succinate (TOPROL-XL) 100 MG 24 hr tablet Take 100 mg by mouth daily. Take with or immediately following a meal.    [provider]  olmesartan-hydrochlorothiazide (BENICAR HCT) 40-25 MG tablet Take 1 tablet by mouth daily. 04/22/18   [provider]    Physical Exam: Vitals:   10/14/21 2130 10/14/21 2200 10/14/21 2309 10/14/21 2330  BP: (!) 157/73 (!) 178/89 (!) 191/96 (!) 154/82  Pulse: 79 78 87 88  Resp: (!) 26 (!) 27 (!) 24 (!) 22  Temp:      TempSrc:      SpO2: 96% 95% 95% 98%  Weight:      Height:        Constitutional: Morbidly obese ill-appearing male laying at approximately 30 degree incline in bed Vitals:   10/14/21 2130 10/14/21 2200 10/14/21 2309 10/14/21 2330  BP: (!) 157/73 (!) 178/89 (!) 191/96 (!) 154/82  Pulse: 79 78 87 88  Resp: (!) 26 (!) 27 (!) 24 (!) 22  Temp:      TempSrc:      SpO2: 96% 95% 95% 98%  Weight:      Height:       Eyes: lids and conjunctivae normal ENMT: Mucous membranes are moist. Neck: normal, supple Respiratory: clear to auscultation bilaterally, no wheezing, no crackles.  Labored respiration when speaking but able to speak in full sentences.  No use of accessory muscle Cardiovascular: Regular rate and rhythm, no murmurs / rubs / gallops.  +4 at pitting edema bilateral lower extremity up  to knee.  Difficult to see if edema goes past the knee due to morbid obesity Abdomen: no tenderness. Bowel sounds positive.  Musculoskeletal: no clubbing / cyanosis. No joint deformity upper and lower extremities.  Skin: Diffuse chronic venous stasis changes of the left pretibial region with weeping of fluid, extensive ulceration with purulent drainage and some with necrotic tissue. Right lower extremity was wrapped in clean bandages       Neurologic: CN 2-12 grossly intact.  Psychiatric: Normal judgment and insight. Alert and oriented x 3. Normal mood.    Labs on Admission: I have personally reviewed following labs and imaging studies  CBC: Recent  Labs  Lab 10/14/21 2035  WBC 12.0*  NEUTROABS 9.6*  HGB 9.4*  HCT 32.6*  MCV 79.1*  PLT 867   Basic Metabolic Panel: Recent Labs  Lab 10/14/21 2035  NA 133*  K 4.0  CL 103  CO2 21*  GLUCOSE 151*  BUN 71*  CREATININE 1.74*  CALCIUM 8.4*   GFR: Estimated Creatinine Clearance: 63.4 mL/min (A) (by C-G formula based on SCr of 1.74 mg/dL (H)). Liver Function Tests: Recent Labs  Lab 10/14/21 2035  AST 11*  ALT 14  ALKPHOS 51  BILITOT 0.4  PROT 7.1  ALBUMIN 2.5*   No results for input(s): LIPASE, AMYLASE in the last 168 hours. No results for input(s): AMMONIA in the last 168 hours. Coagulation Profile: No results for input(s): INR, PROTIME in the last 168 hours. Cardiac Enzymes: No results for input(s): CKTOTAL, CKMB, CKMBINDEX, TROPONINI in the last 168 hours. BNP (last 3 results) No results for input(s): PROBNP in the last 8760 hours. HbA1C: No results for input(s): HGBA1C in the last 72 hours. CBG: No results for input(s): GLUCAP in the last 168 hours. Lipid Profile: No results for input(s): CHOL, HDL, LDLCALC, TRIG, CHOLHDL, LDLDIRECT in the last 72 hours. Thyroid Function Tests: No results for input(s): TSH, T4TOTAL, FREET4, T3FREE, THYROIDAB in the last 72 hours. Anemia Panel: No results for input(s):  VITAMINB12, FOLATE, FERRITIN, TIBC, IRON, RETICCTPCT in the last 72 hours. Urine analysis: No results found for: COLORURINE, APPEARANCEUR, [place], [place], GLUCOSEU, HGBUR, BILIRUBINUR, KETONESUR, PROTEINUR, UROBILINOGEN, NITRITE, LEUKOCYTESUR  Radiological Exams on Admission: DG Ankle Complete Left  Result Date: 10/14/2021 CLINICAL DATA:  Ankle pain for 1 month EXAM: LEFT ANKLE COMPLETE - 3+ VIEW COMPARISON:  None. FINDINGS: Frontal, oblique, and lateral views of the left ankle are obtained. Evaluation is limited due to patient positioning and cooperation. No acute fracture, subluxation, or dislocation. There is severe osteoarthritis of the tibiotalar joint and subtalar joints. Mild osteoarthritis of the midfoot. Prominent superior and inferior calcaneal spurs, with calcification in the plantar fascia. Diffuse soft tissue edema. IMPRESSION: 1. 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 2. Diffuse soft tissue swelling. 3. No acute fracture. Electronically Signed   By: Randa Ngo M.D.   On: 10/14/2021 21:09      Assessment/Plan  Cellulitis of the left lower extremity  -pt has hx of chronic venous stasis/lymphedema but notably has worsened from photos that can be seen in July 2022.  Left lower extremity is weeping and has both purulent and necrotic ulcerations. -Start IV vancomycin and cefepime -Patient also complaining of worsening ankle pain. Will obtain MRI of the LE to assess both the ankle and to rule out osteomyelitis  -Wound consult  AKI Creatinine elevated to 1.74 from prior of 1.3 Give 500cc bolus of LR  avoid nephrotoxic agent  Acute anemia -Hemoglobin of 9.4 from a prior of 13.3 -FOBT is positive and believes home health RN has seen melena in December -He is normally on aspirin and Plavix for history of CVA.  Also recently started taking daily Voltaren for ankle pain.   -Will hold antiplatelets -need GI consult in the morning. Has refused any colonscopy in the  past.  LE edema hold on Lasix for now with AKI with acute anemia concerning for GI bleed   Pressure ulcer wound care consult   HTN hold ACE due to AKI  Hx of CVA holding aspirin and plavix for now due to suspect GI bleed   Morbid obesity BMI greater than 50  which complicates clinical course.  Needs verification of med rec   DVT prophylaxis:.none-has suspected GI bleed and has LE ulcerations unable to get SCDs Code Status: Full Family Communication: Plan discussed with patient at bedside  disposition Plan: Home with observation Consults called:  Admission status: Observation  Level of care: Med-Surg  Status is: Observation  The patient remains OBS appropriate and will d/c before 2 midnights.        Orene Desanctis DO [facility]   If 7PM-7AM, please contact night-coverage [email or web address]   10/15/2021, 1:10 AM

## 2021-10-15 NOTE — Progress Notes (Addendum)
Patient arrived to 6N30, AX0X4, able to make needs known. VSS BP 129/61 T99.0 RR18 PR87. Patient has some SOB noted, 02sat 97%[place]/3L. HOB elevated. Patient BLE were already wrapped upon assessment (RLE-[place]) & (LLE-Xeroform,ABDpad,Kerlix- Changed).Prn pain med was given after dressing change. Multiple small sores noted near anal area. Foam dressings were reapplied. Air mattress in place. Patient was oriented to call bell and surroundings.call bell within reach and will continue to monitor patient.

## 2021-10-15 NOTE — Progress Notes (Signed)
Report was called to [employer], RN on 6N.  Patient transported via [facility] to [facility] 6N in stable condition.

## 2021-10-15 NOTE — ED Notes (Signed)
Pr got his breakfast tray

## 2021-10-15 NOTE — Assessment & Plan Note (Signed)
Septic arthritis Presented at worsening pain on the left leg. Has chronic venous stasis as well as severe lymphedema. Left leg has weeping ulcers with purulent and necrotic areas. Started on broad-spectrum IV antibiotics. MRI lower extremity confirms presence of osteomyelitis of the ankle and foot area as well as septic arthritis of the multiple ankle joints. Orthopedic consulted.  Patient recommended to be transferred to [facility]. Continue pain control.  Follow-up on cultures. Will require ID consultation.

## 2021-10-15 NOTE — Assessment & Plan Note (Signed)
Baseline hemoglobin around 13. On admission hemoglobin was 9.4.  With the concern with the anemia Hemoccult was performed which was positive although patient does have a sacral ulcer which might be confounding the results. Repeat hemoglobin remained stable.  Patient reportedly had some melena episode at home although no bleeding here. At present I am holding aspirin Plavix as well as pharmacological DVT prophylaxis but low threshold to resume them on 1/29 if hemoglobin remained stable. Continue PPI twice daily although I do not think that patient requires GI consult for now based on stable hemoglobin.

## 2021-10-16 DIAGNOSIS — D500 Iron deficiency anemia secondary to blood loss (chronic): Secondary | ICD-10-CM | POA: Diagnosis not present

## 2021-10-16 DIAGNOSIS — K921 Melena: Secondary | ICD-10-CM

## 2021-10-16 LAB — CBC
HCT: 26.9 % — ABNORMAL LOW (ref 39.0–52.0)
Hemoglobin: 8.1 g/dL — ABNORMAL LOW (ref 13.0–17.0)
MCH: 23.8 pg — ABNORMAL LOW (ref 26.0–34.0)
MCHC: 30.1 g/dL (ref 30.0–36.0)
MCV: 79.1 fL — ABNORMAL LOW (ref 80.0–100.0)
Platelets: 227 10*3/uL (ref 150–400)
RBC: 3.4 MIL/uL — ABNORMAL LOW (ref 4.22–5.81)
RDW: 18.9 % — ABNORMAL HIGH (ref 11.5–15.5)
WBC: 8.6 10*3/uL (ref 4.0–10.5)
nRBC: 0 % (ref 0.0–0.2)

## 2021-10-16 LAB — BASIC METABOLIC PANEL
Anion gap: 8 (ref 5–15)
BUN: 42 mg/dL — ABNORMAL HIGH (ref 8–23)
CO2: 24 mmol/L (ref 22–32)
Calcium: 8.7 mg/dL — ABNORMAL LOW (ref 8.9–10.3)
Chloride: 104 mmol/L (ref 98–111)
Creatinine, Ser: 1.48 mg/dL — ABNORMAL HIGH (ref 0.61–1.24)
GFR, Estimated: 52 mL/min — ABNORMAL LOW (ref >60)
Glucose, Bld: 196 mg/dL — ABNORMAL HIGH (ref 70–99)
Potassium: 4.3 mmol/L (ref 3.5–5.1)
Sodium: 136 mmol/L (ref 135–145)

## 2021-10-16 LAB — CREATININE, SERUM
Creatinine, Ser: 1.33 mg/dL — ABNORMAL HIGH (ref 0.61–1.24)
GFR, Estimated: 59 mL/min — ABNORMAL LOW (ref >60)

## 2021-10-16 LAB — GLUCOSE, CAPILLARY
Glucose-Capillary: 197 mg/dL — ABNORMAL HIGH (ref 70–99)
Glucose-Capillary: 138 mg/dL — ABNORMAL HIGH (ref 70–99)
Glucose-Capillary: 156 mg/dL — ABNORMAL HIGH (ref 70–99)

## 2021-10-16 LAB — PREPARE RBC (CROSSMATCH)

## 2021-10-16 MED ORDER — PANTOPRAZOLE SODIUM 40 MG IV SOLR
40.0000 mg | Freq: Two times a day (BID) | INTRAVENOUS | Status: DC
  Administered 2021-10-16 – 2021-10-17 (×2): 40 mg via INTRAVENOUS
  Filled 2021-10-16 (×2): qty 40

## 2021-10-16 MED ORDER — METRONIDAZOLE 500 MG/100ML IV SOLN
500.0000 mg | Freq: Two times a day (BID) | INTRAVENOUS | Status: DC
  Administered 2021-10-16 – 2021-10-17 (×3): 500 mg via INTRAVENOUS
  Filled 2021-10-16 (×3): qty 100

## 2021-10-16 MED ORDER — INSULIN ASPART 100 UNIT/ML IJ SOLN
0.0000 [IU] | Freq: Three times a day (TID) | INTRAMUSCULAR | Status: DC
  Administered 2021-10-16: 2 [IU] via SUBCUTANEOUS
  Administered 2021-10-16: 11:00:00 3 [IU] via SUBCUTANEOUS
  Administered 2021-10-17: 2 [IU] via SUBCUTANEOUS
  Administered 2021-10-17: 3 [IU] via SUBCUTANEOUS
  Administered 2021-10-17 – 2021-10-18 (×4): 2 [IU] via SUBCUTANEOUS
  Administered 2021-10-20 (×3): 3 [IU] via SUBCUTANEOUS
  Administered 2021-10-21 (×3): 2 [IU] via SUBCUTANEOUS
  Administered 2021-10-22: 3 [IU] via SUBCUTANEOUS
  Administered 2021-10-22: 2 [IU] via SUBCUTANEOUS
  Administered 2021-10-22 – 2021-10-23 (×3): 3 [IU] via SUBCUTANEOUS
  Administered 2021-10-23: 2 [IU] via SUBCUTANEOUS
  Administered 2021-10-24 (×3): 3 [IU] via SUBCUTANEOUS
  Administered 2021-10-25 (×2): 2 [IU] via SUBCUTANEOUS

## 2021-10-16 NOTE — Progress Notes (Signed)
Patient ID: [name] [name], male   DOB: [birth year], 66 y.o.   MRN: [id number] Orthopedics was asked to take a look at this patient given his chronic right lower extremity wound and his legs in general.  He has wraps on them at this standpoint but I been able to look in the media section of epic to see the photographs.  He has severe lymphedema of his bilateral lower extremities.  He was admitted just a day ago secondary to increased drainage from a right ankle wound and associated cellulitis.  An MRI showed osteomyelitis.  He has been a patient of the wound care center for some time now.  He does ambulate which, given the shape of both his legs look like, is quite difficult.  He says he injured his ankle in early December and it sounds like this was a fracture and he is still been ambulating on his lower extremities.  He does have multiple comorbidities as well.  I did come to see him at the bedside and talk to him about his situation.  I agree with continued IV antibiotics.  The wraps are appropriate for now on his legs.  I will definitely need to consult my partner Dr. Sharol Given for his expertise as a relates to this patient's chronic leg wounds.  I spoke to the patient about the possibility of aggressive surgical intervention such as an amputation as a means for treating osteomyelitis.  Dr. Sharol Given will likely be around on Tuesday.  Until then, continue current treatment regimen of antibiotics and wound care.

## 2021-10-16 NOTE — Evaluation (Signed)
Occupational Therapy Evaluation Patient Details Name: [name] [name] MRN: [id number] DOB: [birth year] Today's Date: 10/16/2021   History of Present Illness 66 y/o M presenting to [place] n 1/27 with L Leg pain x1 month, reports turning his L ankle when getting into his car in December, has not been able to bear weight thorugh LLE since. PMH includes morbid obesity, CHF, HTN, and CVA   Clinical Impression   Pt reports needing assistance at baseline for ADLs and mobility, has an aide that assists with ADLs and uses a power chair/RW to get around his home. Pt currently set up -max A for ADLs, mod-max A +2 for bed mobility and transfers, increased assistance needed when returning to supine from sitting EOB. Pt presenting with impairments listed below, will follow acutely. Recommend SNF at d/c pending pt progress.     Recommendations for follow up therapy are one component of a multi-disciplinary discharge planning process, led by the attending physician.  Recommendations may be updated based on patient status, additional functional criteria and insurance authorization.   Follow Up Recommendations  Skilled nursing-short term rehab (<3 hours/day)    Assistance Recommended at Discharge Intermittent Supervision/Assistance  Patient can return home with the following A lot of help with bathing/dressing/bathroom;Two people to help with walking and/or transfers;Assistance with cooking/housework;Help with stairs or ramp for entrance;Assist for transportation    Functional Status Assessment  Patient has had a recent decline in their functional status and demonstrates the ability to make significant improvements in function in a reasonable and predictable amount of time.  Equipment Recommendations  None recommended by OT;Other (comment) (defer to next venue of care)    Recommendations for Other Services PT consult     Precautions / Restrictions Precautions Precautions: Fall Restrictions Weight Bearing  Restrictions: No      Mobility Bed Mobility Overal bed mobility: Needs Assistance Bed Mobility: Sit to Supine, Supine to Sit     Supine to sit: Mod assist, Max assist, +2 for physical assistance Sit to supine: Mod assist, Max assist, +2 for physical assistance        Transfers                   General transfer comment: deferred      Balance Overall balance assessment: Needs assistance Sitting-balance support: Feet supported Sitting balance-Leahy Scale: Fair Sitting balance - Comments: sits EOB for breakfast without difficulty                                   ADL either performed or assessed with clinical judgement   ADL Overall ADL's : Needs assistance/impaired Eating/Feeding: Set up;Sitting   Grooming: Set up;Sitting   Upper Body Bathing: Moderate assistance;Bed level;Sitting   Lower Body Bathing: Maximal assistance;Bed level   Upper Body Dressing : Moderate assistance;Sitting   Lower Body Dressing: Maximal assistance;Sit to/from stand Lower Body Dressing Details (indicate cue type and reason): to don socks at bed level Toilet Transfer: Maximal assistance;+2 for safety/equipment;+2 for physical assistance;BSC/3in1;Stand-pivot   Toileting- Clothing Manipulation and Hygiene: Maximal assistance;Sitting/lateral lean;Sit to/from stand       Functional mobility during ADLs: Moderate assistance;Maximal assistance;+2 for physical assistance       Vision Baseline Vision/History: 1 Wears glasses Vision Assessment?: No apparent visual deficits     Perception     Praxis      Pertinent Vitals/Pain Pain Assessment Pain Assessment: No/denies pain  Hand Dominance     Extremity/Trunk Assessment Upper Extremity Assessment Upper Extremity Assessment: Overall WFL for tasks assessed   Lower Extremity Assessment Lower Extremity Assessment: Defer to PT evaluation       Communication Communication Communication: No difficulties    Cognition Arousal/Alertness: Awake/alert Behavior During Therapy: WFL for tasks assessed/performed Overall Cognitive Status: Within Functional Limits for tasks assessed                                 General Comments: slow to respond     General Comments  VSS on 3.5L O2    Exercises     Shoulder Instructions      Home Living Family/patient expects to be discharged to:: Private residence Living Arrangements: Alone Available Help at Discharge: Neighbor;Friend(s);Family;Other (Comment);Personal care attendant (daughter and sister check on him, reports RN comes every AM and PM) Type of Home: House Home Access: Ramped entrance     Home Layout: One level     Bathroom Shower/Tub: Astronomer Accessibility: No   Home Equipment: BSC/3in1;Wheelchair - Engineer, manufacturing systems (2 wheels)   Additional Comments: sleeps in lift chair      Prior Functioning/Environment Prior Level of Function : Needs assist             Mobility Comments: primarily uses power chiar ADLs Comments: PCA does ADLs, reports sponge bathing at baseline since December        OT Problem List: Decreased strength;Decreased range of motion;Decreased activity tolerance;Impaired balance (sitting and/or standing);Decreased knowledge of use of DME or AE;Decreased safety awareness      OT Treatment/Interventions: Therapeutic exercise;Self-care/ADL training;DME and/or AE instruction;Energy conservation;Therapeutic activities;Patient/family education;Balance training    OT Goals(Current goals can be found in the care plan section) Acute Rehab OT Goals OT Goal Formulation: With patient Time For Goal Achievement: 10/30/21 Potential to Achieve Goals: Good  OT Frequency: Min 2X/week    Co-evaluation PT/OT/SLP Co-Evaluation/Treatment: Yes Reason for Co-Treatment: For patient/therapist safety;Complexity of the patient's impairments (multi-system involvement);To address  functional/ADL transfers   OT goals addressed during session: ADL's and self-care      AM-PAC OT "6 Clicks" Daily Activity     Outcome Measure Help from another person eating meals?: None Help from another person taking care of personal grooming?: None Help from another person toileting, which includes using toliet, bedpan, or urinal?: Total Help from another person bathing (including washing, rinsing, drying)?: Total Help from another person to put on and taking off regular upper body clothing?: A Lot Help from another person to put on and taking off regular lower body clothing?: Total 6 Click Score: 13   End of Session Nurse Communication: Mobility status  Activity Tolerance: Patient tolerated treatment well Patient left: in bed;with call bell/phone within reach  OT Visit Diagnosis: Unsteadiness on feet (R26.81);Muscle weakness (generalized) (M62.81)                Time: 3557-3220 OT Time Calculation (min): 52 min Charges:  OT General Charges $OT Visit: 1 Visit OT Evaluation $OT Eval Moderate Complexity: 1 Mod OT Treatments $Self Care/Home Management : 23-37 mins  Lynnda Child, OTD, OTR/L Acute Rehab [phone]) [phone] [place] 10/16/2021, 1:03 PM

## 2021-10-16 NOTE — Evaluation (Signed)
Physical Therapy Evaluation Patient Details Name: [name] [name] MRN: [id number] DOB: 1955/09/21 Today's Date: 10/16/2021  History of Present Illness  66 y/o M presenting to [facility] n 1/27 with L Leg pain x1 month, reports turning his L ankle when getting into his car in December, has not been able to bear weight thorugh LLE since. PMH includes morbid obesity, CHF, HTN, and CVA  Clinical Impression  Pt admitted with above. PTA, pt requires assist for ADL's from an aide and is modI with stand pivot transfers to a power wheelchair. Pt presents with decreased functional mobility secondary to edema, decreased skin integrity, decreased functional use of left foot and pain, and impaired balance. Pt requiring two person mod-max assist for bed mobility. Pt able to eat breakfast sitting on edge of bed, but unfortunately, kept sliding anteriorly; due to body habitus, was unable to adjust, so assisted back into supine position. Pt currently does not have adequate seating to get out of bed. Called portable who states they are out of recliners. Will need bariatric equipment to mobilize this pt.     Recommendations for follow up therapy are one component of a multi-disciplinary discharge planning process, led by the attending physician.  Recommendations may be updated based on patient status, additional functional criteria and insurance authorization.  Follow Up Recommendations Skilled nursing-short term rehab (<3 hours/day)    Assistance Recommended at Discharge Frequent or constant Supervision/Assistance  Patient can return home with the following  Two people to help with bathing/dressing/bathroom;Two people to help with walking and/or transfers;Help with stairs or ramp for entrance    Equipment Recommendations Other (comment) (hoyer lift)  Recommendations for Other Services       Functional Status Assessment Patient has had a recent decline in their functional status and demonstrates the ability to make  significant improvements in function in a reasonable and predictable amount of time.     Precautions / Restrictions Precautions Precautions: Fall Restrictions Weight Bearing Restrictions: No      Mobility  Bed Mobility Overal bed mobility: Needs Assistance Bed Mobility: Sit to Supine, Supine to Sit     Supine to sit: Mod assist, +2 for physical assistance Sit to supine: Max assist, +2 for physical assistance   General bed mobility comments: Assist for LLE off edge of bed, use of bed pad to scoot hips forward. pt requiring increased assist for return to bed due to scooting anteriorly towards edge. Heavy assist to lift BLE's off edge of bed, difficulty bending knees, max cues for using bed rails to help scoot up in bed with bed in Trendelenberg position. LLE supported.    Transfers                   General transfer comment: deferred    Ambulation/Gait                  Stairs            Wheelchair Mobility    Modified Rankin (Stroke Patients Only)       Balance Overall balance assessment: Needs assistance Sitting-balance support: Feet supported Sitting balance-Leahy Scale: Fair Sitting balance - Comments: sits EOB for breakfast without difficulty                                     Pertinent Vitals/Pain Pain Assessment Pain Assessment: Faces Faces Pain Scale: Hurts whole lot Pain Location: LLE with  movement Pain Descriptors / Indicators: Grimacing, Guarding Pain Intervention(s): Monitored during session    Home Living Family/patient expects to be discharged to:: Private residence Living Arrangements: Alone Available Help at Discharge: Neighbor;Friend(s);Family;Other (Comment);Personal care attendant (daughter and sister check on him, reports RN comes every AM and PM) Type of Home: House Home Access: Ramped entrance       Home Layout: One level Home Equipment: BSC/3in1;Wheelchair - Engineer, manufacturing systems (2  wheels) Additional Comments: sleeps in lift chair    Prior Function Prior Level of Function : Needs assist             Mobility Comments: primarily uses power chiar ADLs Comments: PCA does ADLs, reports sponge bathing at baseline since December     Hand Dominance        Extremity/Trunk Assessment   Upper Extremity Assessment Upper Extremity Assessment: Defer to OT evaluation    Lower Extremity Assessment Lower Extremity Assessment: RLE deficits/detail;LLE deficits/detail RLE Deficits / Details: Erythema, edema, grossly weak LLE Deficits / Details: LLE ulceration with purulent drainage/weeping, gross edema, erythema, foot deformity    Cervical / Trunk Assessment Cervical / Trunk Assessment: Other exceptions Cervical / Trunk Exceptions: increased body habitus  Communication   Communication: No difficulties  Cognition Arousal/Alertness: Awake/alert Behavior During Therapy: WFL for tasks assessed/performed Overall Cognitive Status: Within Functional Limits for tasks assessed                                 General Comments: slow to respond        General Comments General comments (skin integrity, edema, etc.): 95% SpO2 on 3.5L O2    Exercises     Assessment/Plan    PT Assessment Patient needs continued PT services  PT Problem List Decreased strength;Decreased activity tolerance;Decreased balance;Decreased mobility;Impaired sensation;Obesity;Pain       PT Treatment Interventions DME instruction;Functional mobility training;Therapeutic activities;Therapeutic exercise;Balance training;Patient/family education    PT Goals (Current goals can be found in the Care Plan section)  Acute Rehab PT Goals Patient Stated Goal: less pain, be able to stand PT Goal Formulation: With patient Time For Goal Achievement: 10/30/21 Potential to Achieve Goals: Fair    Frequency Min 3X/week     Co-evaluation PT/OT/SLP Co-Evaluation/Treatment: Yes Reason for  Co-Treatment: For patient/therapist safety;To address functional/ADL transfers PT goals addressed during session: Mobility/safety with mobility OT goals addressed during session: ADL's and self-care       AM-PAC PT "6 Clicks" Mobility  Outcome Measure Help needed turning from your back to your side while in a flat bed without using bedrails?: A Lot Help needed moving from lying on your back to sitting on the side of a flat bed without using bedrails?: A Lot Help needed moving to and from a bed to a chair (including a wheelchair)?: Total Help needed standing up from a chair using your arms (e.g., wheelchair or bedside chair)?: Total Help needed to walk in hospital room?: Total Help needed climbing 3-5 steps with a railing? : Total 6 Click Score: 8    End of Session Equipment Utilized During Treatment: Oxygen Activity Tolerance: Patient limited by pain Patient left: in bed;with call bell/phone within reach Nurse Communication: Mobility status PT Visit Diagnosis: Pain;Other abnormalities of gait and mobility (R26.89);Muscle weakness (generalized) (M62.81);Difficulty in walking, not elsewhere classified (R26.2) Pain - Right/Left: Left Pain - part of body: Ankle and joints of foot    Time: 1443-1540 PT Time Calculation (min) (ACUTE  ONLY): 44 min   Charges:   PT Evaluation $PT Eval Moderate Complexity: [place], [place], DPT Acute Rehabilitation Services Pager [phone] Office [phone]   Deno Etienne 10/16/2021, 1:20 PM

## 2021-10-16 NOTE — Progress Notes (Addendum)
Progress Note   Patient: [name] [name] PXT:[id number] DOB: [birth year] DOA: 10/14/2021     1 DOS: the patient was seen and examined on 10/16/2021   Brief hospital course: Admitted 1/27 with worsening b/l lower extremity wounds, found to have osteo/septic arthritis left foot   Assessment and Plan * Osteomyelitis of ankle and foot ([place])- (present on admission) Septic arthritis Presented at worsening pain on the left leg. Has chronic venous stasis as well as severe lymphedema. Left leg has weeping ulcers with purulent and necrotic areas. Started on broad-spectrum IV antibiotics. MRI lower extremity confirms presence of osteomyelitis of the ankle and foot area as well as septic arthritis of the multiple ankle joints. Orthopedic consulted. They advise continuing abx (currently on vanc/cefepime, will add flagyl) and eval by Dr. Sharol Given on Tuesday Continue pain control.  Follow-up on cultures. Will require ID eventual ID consultation  Lower GI bleed? Patient says his home nurses have noted intermittent red blood in stool for the past month or so. Hgb here is 9s from baseline of normal. No hematemesis or melena. Is on asa and plavix and diclofenac. Hgb down to 8.1 today - holding plavix and aspirin - GI consulted, spoke w/ dr. Myrtice Lauth, her team will see in consultation - start ppi iv bid - type and cross 2 units - hold home diclofenac  History of CVA (cerebrovascular accident) On aspirin and Plavix at home - possibly resuming asa today as above - holding plavix - cont statin  Pressure ulcer- (present on admission) Stage II bilateral buttock ulcer, present on admission. Wound care consulted. Low-air-loss mattress requested. Continue dressing changes.  Pressure Injury 10/15/21 Buttocks Right;Left Stage 2 -  Partial thickness loss of dermis presenting as a shallow open injury with a red, pink wound bed without slough. pt with multiple areas of stage ii pressure injury on both sides of the  buttocks, also (Active)  10/15/21 1635  Location: Buttocks  Location Orientation: Right;Left  Staging: Stage 2 -  Partial thickness loss of dermis presenting as a shallow open injury with a red, pink wound bed without slough.  Wound Description (Comments): pt with multiple areas of stage ii pressure injury on both sides of the buttocks, also purple discoloration in area as well, sacral foam applied  Present on Admission: Yes     AKI (acute kidney injury) ([place])- (present on admission) Baseline serum creatinine around 1.3.  On presentation serum creatinine 1.74.  Receiving IV fluid.   - repeat cr today pending  Chronic diastolic heart failure ([place])- (present on admission) Essential hypertension. Patient takes Lasix at home as needed.   Does not appear to be decompensated For now Lasix is on hold. Will continue Toprol-XL.  Morbid obesity ([place])- (present on admission) Body mass index is 50.22 kg/m.  Placing the patient at high risk for poor outcome. Not on cpap at home  T2DM Not on meds at home but here sugars elevated to 200s - start SSI    Subjective: Denies any acute complaint.  No nausea no vomiting.  No fever no chills.  Chronic left leg pain  Objective Vitals:   10/15/21 2159 10/16/21 0145 10/16/21 0527 10/16/21 0812  BP: 129/61 138/74 129/70 125/66  Pulse: 87 85 80 80  Resp: _0 Temp: 99 F (37.2 C) 98.4 F (36.9 C) 99.2 F (37.3 C) 98.9 F (37.2 C)  TempSrc: Oral Oral Oral Oral  SpO2: 97% 97% 97% 96%  Weight: (!) 159.2 kg  Height:        General: chronically ill appearing Cardiovascular: S1 and S2 Present, no Murmur, Respiratory: good respiratory effort, Bilateral Air entry present and CTA, no Crackles, no wheezes Abdomen: Bowel Sound present, Soft and no tenderness Extremities: Bilateral chronic lymphedema Left leg ulceration with discharge and necrotic areas.  Right leg erythema without any warmth or tenderness. Neurology: alert and oriented to  time, place, and person affect appropriate. no new focal deficit Gait not checked due to patient safety concerns   Data Reviewed:  I have Reviewed nursing notes, Vitals, and Lab results since pt's last encounter. Pertinent lab results CBC shows stable hemoglobin BMP shows improving serum creatinine I have ordered test including CBC, BMP, iron studies, ESR and CRP I have ordered imaging studies echocardiogram. I have independently visualized and interpreted imaging chest x-ray which showed no congestion. I have reviewed the last note from wound care clinic, and discussed pt's care plan and test results with orthopedic surgery.   Family Communication: None at bedside  Disposition: Status is: Inpatient  Remains inpatient appropriate because: Osteomyelitis and septic arthritis will require long-term IV antibiotic plan as well as interventions.  Author: Desma Maxim, MD 10/16/2021 9:47 AM  For on call review [email or web address].

## 2021-10-16 NOTE — Progress Notes (Signed)
RT NOTE:  Pt states he does not wear CPAP at home. RT educated patient on CPAP use. Pt continued to refuse.

## 2021-10-16 NOTE — Progress Notes (Signed)
RT NOTE:   Pt states he does not wear CPAP at home. RT educated patient on CPAP use. Pt continued to refuse.

## 2021-10-16 NOTE — Consult Note (Signed)
Referring Provider: Gwynne Edinger, MD Primary Care Physician:  Manon Hilding, MD Primary Gastroenterologist:  None  Reason for Consultation:  Anemia   IMPRESSION:  Microcytic anemia with heme + stools Bright red blood in the stool over the last month Extensive NSAIDs over the last month Intermittent heartburn treated with TUMS PRN Esophageal food impaction several years ago without ongoing dysphagia No prior colon cancer screening Chronic ASA and Plavix for a history of CVA BMI 52  Although anemia may be multifactorial, endoscopic evaluation recommended to evaluate for GI blood loss anemia. Blood in the stool is worrisome for polyp or mass in addition to outlet bleeding in the setting of Plavix. However, he is also at risk for UGI pathology given his heartburn and recent heavy NSAID use.    PLAN: - Anemia labs - Serial hgb/hct with transfusion as indicated - Empiric PPI - Plavix on hold - Avoid NSAIDs as you are able - Endoscopic evaluation with EGD and colonoscopy after 5-day Plavix washout  The procedures are high risk given his comorbidities and chronic Plavix use. The nature of the procedures, as well as the risks,benefits, and alternatives were carefully and thoroughly reviewed with the patient. Ample time for discussion and questions allowed. The patient understood, was satisfied, and agreed to proceed.   GI will check back in a couple of days. Please call with any questions or concerns prior to that time.   HPI: [name] [name] is a 66 y.o. male seen for unexplained anemia. The history is obtained through the patient and review of his electronic health record. He was admitted 10/14/21 with lower extremity cellulitis, septic arthritis and osteomyelitis. He injured his ankle in December and has been using extensive NSAIDs including diclofenac since that time.  He is on Plavix and ASA after a CVA, chronic diastolic heart failure, OSA not on CPAP, hypertension, chronic  venous stasis, lymphedema, diabetes, and morbid obesity.   He was found to have progressive anemia occurring on ASA and Plavix. Reports intermittent bright red blood in the stool over the last month.   Labs 03/2021 hemoglobin 13.3 Admission hemoglobin 9.4 Today: Hemoglobin 8.1, MCV 79.1, RDW 18.9, platelets 227 BUN 42, crt 1.48 Albumin 2.5 Normal liver enzymes Stool guaiac + No recent abdominal imaging  Chronic GI symptoms including baseline bowel habits of one bowel movement every 3-4 days which he describes as "almost constipation" without straining or a sense of incomplete evacuation and intermittent heartburn treated with TUMS 2-3 times a month.  He had an esophageal food impaction a few years ago with a [employer] onion treated endosc[facility] at [facility]. No additional dysphagia or odynophagia since that time. No history of allergies, food allergies or asthma. No further endoscopic evaluation.  No prior colon cancer screening. He was referred to a surgeon but told that his weight precluded him from having a colonoscopy.    There is no known family history of colon cancer or polyps. No family history of stomach cancer or other GI malignancy. No family history of inflammatory bowel disease or celiac.    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[facility])    Essential hypertension    History of nephrolithiasis    History of open leg wound    History of stroke July 2004   Obstructive sleep apnea    TIA (transient ischemic attack)    Varicose veins    Laser ablation of left great saphenous vein 2009    Past Surgical History:  Procedure Laterality Date   CYSTOSCOPY WITH RETROGRADE PYELOGRAM, URETEROSCOPY AND STENT PLACEMENT Right 08/02/2015   Procedure: CYSTOSCOPY WITH RETROGRADE PYELOGRAM, URETEROSCOPY , STONE EXTRACTION AND STENT PLACEMENT;  Surgeon: Cleon Gustin, MD;  Location: [facility];  Service: Urology;  Laterality: Right;   HOLMIUM LASER APPLICATION Right  81/27/5170   Procedure: HOLMIUM LASER APPLICATION;  Surgeon: Cleon Gustin, MD;  Location: [facility];  Service: Urology;  Laterality: Right;   KIDNEY STONE SURGERY     VASECTOMY  1984   VEIN SURGERY Left 06/2010   Laser ablation     Current Facility-Administered Medications  Medication Dose Route Frequency Provider Last Rate Last Admin   acetaminophen (TYLENOL) tablet 650 mg  650 mg Oral Q6H PRN Lavina Hamman, MD       Or   acetaminophen (TYLENOL) suppository 650 mg  650 mg Rectal Q6H PRN Lavina Hamman, MD       allopurinol (ZYLOPRIM) tablet 300 mg  300 mg Oral Daily Lavina Hamman, MD   300 mg at 10/16/21 0851   atorvastatin (LIPITOR) tablet 10 mg  10 mg Oral Daily Lavina Hamman, MD   10 mg at 10/16/21 0851   ceFEPIme (MAXIPIME) 2 g in sodium chloride 0.9 % 100 mL IVPB  2 g Intravenous Q8H Angela Adam, RPH 200 mL/hr at 10/16/21 0854 2 g at 10/16/21 0854   docusate sodium (COLACE) capsule 100 mg  100 mg Oral BID Lavina Hamman, MD   100 mg at 10/16/21 0174   hydrALAZINE (APRESOLINE) tablet 50 mg  50 mg Oral BID Lavina Hamman, MD   50 mg at 10/16/21 0850   HYDROcodone-acetaminophen (NORCO/VICODIN) 5-325 MG per tablet 1 tablet  1 tablet Oral Q6H PRN Lavina Hamman, MD   1 tablet at 10/16/21 1107   HYDROmorphone (DILAUDID) injection 0.5 mg  0.5 mg Intravenous Q4H PRN Lavina Hamman, MD   0.5 mg at 10/15/21 2115   insulin aspart (novoLOG) injection 0-15 Units  0-15 Units Subcutaneous TID WC Gwynne Edinger, MD   3 Units at 10/16/21 1111   metoprolol succinate (TOPROL-XL) 24 hr tablet 100 mg  100 mg Oral Daily Lavina Hamman, MD   100 mg at 10/16/21 9449   metroNIDAZOLE (FLAGYL) IVPB 500 mg  500 mg Intravenous Q12H Gwynne Edinger, MD 100 mL/hr at 10/16/21 1100 500 mg at 10/16/21 1100   ondansetron (ZOFRAN) tablet 4 mg  4 mg Oral Q6H PRN Lavina Hamman, MD       Or   ondansetron [facility]) injection 4 mg  4 mg Intravenous Q6H PRN Lavina Hamman, MD       pantoprazole  (PROTONIX) injection 40 mg  40 mg Intravenous Q12H Wouk, Ailene Rud, MD       vancomycin (VANCOCIN) 2,500 mg in sodium chloride 0.9 % 500 mL IVPB  2,500 mg Intravenous Q36H Angela Adam, RPH 250 mL/hr at 10/16/21 1539 2,500 mg at 10/16/21 1539    Allergies as of 10/14/2021   (No Known Allergies)    Family History  Problem Relation Age of Onset   Bone cancer Mother    Hypertension Father    Pancreatic cancer Maternal Grandmother    Stroke Paternal Grandmother    Diabetes Paternal Grandfather    Heart attack Brother    Hypertension Brother    Heart attack Sister     Social History   Socioeconomic History   Marital status: Married    Spouse  name: Not on file   Number of children: 4   Years of education: 14   Highest education level: Not on file  Occupational History   Occupation: controll Financial planner     Employer: [employer]  Tobacco Use   Smoking status: Never   Smokeless tobacco: Never  Substance and Sexual Activity   Alcohol use: No    Alcohol/week: 0.0 standard drinks   Drug use: No   Sexual activity: Not on file  Other Topics Concern   Not on file  Social History Narrative   Not on file   Social Determinants of Health   Financial Resource Strain: Not on file  Food Insecurity: Not on file  Transportation Needs: Not on file  Physical Activity: Not on file  Stress: Not on file  Social Connections: Not on file  Intimate Partner Violence: Not on file     Physical Exam: General:   Alert,  chronically ill appearing, pleasant and cooperative in NAD, he looks older than his stated age Head:  Normocephalic and atraumatic. Eyes:  Sclera clear, no icterus.   Conjunctiva pink. Ears:  Normal auditory acuity. Nose:  No deformity, discharge,  or lesions. Mouth:  Poor dentition. No deformity or lesions.   Neck:  Supple; short neck, no masses or thyromegaly. Lungs:  Clear throughout to auscultation.   No wheezes. Heart:  Regular rate and rhythm; no  murmurs. Abdomen:  Soft, central obesity, nontender, nondistended, normal bowel sounds, no rebound or guarding. No hepatosplenomegaly. He has a ventral and reducible umbilical hernia.   Rectal:  Deferred  Msk:  Symmetrical. No boney deformities LAD: No inguinal or umbilical LAD Extremities: Left leg ulceration, erythematous right leg Neurologic:  Alert and  oriented x4;  grossly nonfocal Skin:  Intact without significant lesions or rashes. Psych:  Alert and cooperative. Giggling throughout my evaluation.   Lab Results: Recent Labs    10/14/21 2035 10/15/21 0330 10/16/21 1221  WBC 12.0* 10.3 8.6  HGB 9.4* 9.5* 8.1*  HCT 32.6* 31.5* 26.9*  PLT 305 282 227   BMET Recent Labs    10/14/21 2035 10/15/21 0330 10/16/21 1221  NA 133* 134* 136  K 4.0 3.8 4.3  CL 103 105 104  CO2 21* 23 24  GLUCOSE 151* 215* 196*  BUN 71* 64* 42*  CREATININE 1.74* 1.68* 1.48*  CALCIUM 8.4* 8.3* 8.7*   LFT Recent Labs    10/14/21 2035  PROT 7.1  ALBUMIN 2.5*  AST 11*  ALT 14  ALKPHOS 51  BILITOT 0.4     Lazaro Isenhower L. Tarri Glenn, MD, MPH 10/16/2021, 3:56 PM

## 2021-10-17 DIAGNOSIS — M869 Osteomyelitis, unspecified: Secondary | ICD-10-CM | POA: Diagnosis not present

## 2021-10-17 LAB — BASIC METABOLIC PANEL
Anion gap: 9 (ref 5–15)
BUN: 37 mg/dL — ABNORMAL HIGH (ref 8–23)
CO2: 24 mmol/L (ref 22–32)
Calcium: 8.6 mg/dL — ABNORMAL LOW (ref 8.9–10.3)
Chloride: 103 mmol/L (ref 98–111)
Creatinine, Ser: 1.3 mg/dL — ABNORMAL HIGH (ref 0.61–1.24)
GFR, Estimated: >60 mL/min (ref >60)
Glucose, Bld: 139 mg/dL — ABNORMAL HIGH (ref 70–99)
Potassium: 4.3 mmol/L (ref 3.5–5.1)
Sodium: 136 mmol/L (ref 135–145)

## 2021-10-17 LAB — CBC
HCT: 28.3 % — ABNORMAL LOW (ref 39.0–52.0)
Hemoglobin: 8.6 g/dL — ABNORMAL LOW (ref 13.0–17.0)
MCH: 24 pg — ABNORMAL LOW (ref 26.0–34.0)
MCHC: 30.4 g/dL (ref 30.0–36.0)
MCV: 79.1 fL — ABNORMAL LOW (ref 80.0–100.0)
Platelets: 235 10*3/uL (ref 150–400)
RBC: 3.58 MIL/uL — ABNORMAL LOW (ref 4.22–5.81)
RDW: 18.8 % — ABNORMAL HIGH (ref 11.5–15.5)
WBC: 8.9 10*3/uL (ref 4.0–10.5)
nRBC: 0 % (ref 0.0–0.2)

## 2021-10-17 LAB — GLUCOSE, CAPILLARY
Glucose-Capillary: 125 mg/dL — ABNORMAL HIGH (ref 70–99)
Glucose-Capillary: 146 mg/dL — ABNORMAL HIGH (ref 70–99)
Glucose-Capillary: 163 mg/dL — ABNORMAL HIGH (ref 70–99)
Glucose-Capillary: 169 mg/dL — ABNORMAL HIGH (ref 70–99)

## 2021-10-17 LAB — HEMOGLOBIN A1C
Hgb A1c MFr Bld: 7.3 % — ABNORMAL HIGH (ref 4.8–5.6)
Mean Plasma Glucose: 162.81 mg/dL

## 2021-10-17 LAB — IRON AND TIBC
Iron: 23 ug/dL — ABNORMAL LOW (ref 45–182)
Saturation Ratios: 10 % — ABNORMAL LOW (ref 17.9–39.5)
TIBC: 227 ug/dL — ABNORMAL LOW (ref 250–450)
UIBC: 204 ug/dL

## 2021-10-17 LAB — VITAMIN B12: Vitamin B-12: 185 pg/mL (ref 180–914)

## 2021-10-17 LAB — RETICULOCYTES
Immature Retic Fract: 29 % — ABNORMAL HIGH (ref 2.3–15.9)
RBC.: 3.67 MIL/uL — ABNORMAL LOW (ref 4.22–5.81)
Retic Count, Absolute: 42.9 10*3/uL (ref 19.0–186.0)
Retic Ct Pct: 1.2 % (ref 0.4–3.1)

## 2021-10-17 LAB — FOLATE: Folate: 7 ng/mL (ref >5.9)

## 2021-10-17 LAB — FERRITIN: Ferritin: 237 ng/mL (ref 24–336)

## 2021-10-17 MED ORDER — POLYETHYLENE GLYCOL 3350 17 G PO PACK
17.0000 g | PACK | Freq: Two times a day (BID) | ORAL | Status: DC
  Filled 2021-10-17 (×2): qty 1

## 2021-10-17 MED ORDER — VANCOMYCIN HCL IN DEXTROSE 1-5 GM/200ML-% IV SOLN
1000.0000 mg | Freq: Two times a day (BID) | INTRAVENOUS | Status: AC
  Administered 2021-10-18 – 2021-10-22 (×9): 1000 mg via INTRAVENOUS
  Filled 2021-10-17 (×12): qty 200

## 2021-10-17 MED ORDER — INSULIN GLARGINE-YFGN 100 UNIT/ML ~~LOC~~ SOLN
10.0000 [IU] | Freq: Every day | SUBCUTANEOUS | Status: DC
  Administered 2021-10-17 – 2021-10-25 (×8): 10 [IU] via SUBCUTANEOUS
  Filled 2021-10-17 (×9): qty 0.1

## 2021-10-17 MED ORDER — PANTOPRAZOLE SODIUM 40 MG PO TBEC
40.0000 mg | DELAYED_RELEASE_TABLET | Freq: Two times a day (BID) | ORAL | Status: DC
  Administered 2021-10-17 – 2021-10-25 (×14): 40 mg via ORAL
  Filled 2021-10-17 (×15): qty 1

## 2021-10-17 NOTE — Progress Notes (Signed)
PROGRESS NOTE    [name] [name]  GYK:[id number] DOB: 01/21/1956 DOA: 10/14/2021 PCP: Manon Hilding, MD   Brief Narrative:  [name] [name] is a 66 y.o. male with medical history significant for chronic venous stasis, lymphedema, CVA, asbestosis, hypertension, OSA not on CPAP, who presents with concerns of worsening left ankle pain. 08/27/2021 feels he rolled his ankle when standing. 09/15/2021 seen by orthopedics, x-ray performed.  Diagnosed to have a sprain and ankle 12/28 seen by wound care at which time patient complains that he is barely able to walk. 1/27 presents to ER with worsening left leg pain and redness.  Admitted for cellulitis.  MRI ordered. 1/28 MRI positive for osteomyelitis of the ankle joint as well as hindfoot and septic arthritis.  Orthopedic consulted.   Assessment & Plan:   Principal Problem:   Osteomyelitis of ankle and foot (HCC) Active Problems:   Morbid obesity (HCC)   OSA (obstructive sleep apnea)   Essential hypertension   Chronic diastolic heart failure (HCC)   AKI (acute kidney injury) (HCC)   Anemia   Pressure ulcer   History of CVA (cerebrovascular accident)   Septic arthritis of ankle and foot region ([place])  Osteomyelitis of left ankle and foot (HCC)/septic arthritis- (present on admission) Has chronic venous stasis as well as severe lymphedema. Left leg has weeping ulcers with purulent and necrotic areas. Started on broad-spectrum IV antibiotics. MRI lower extremity confirms presence of osteomyelitis of the ankle and foot area as well as septic arthritis of the multiple ankle joints. Orthopedic consulted. They advise continuing abx (currently on vanc/cefepime/Flagyl) and eval by Dr. Sharol Given on Tuesday. Continue pain control.  Follow-up on cultures.  We will discontinue Flagyl. Will require ID eventual ID consultation   Lower GI bleed? Patient says his home nurses have noted intermittent red blood in stool for the past month or so. Hgb here is  9s from baseline of normal. No hematemesis or melena.  Hemoglobin stable now. -Assessed by GI.  Holding plavix and aspirin.  Plan for EGD and colonoscopy once Plavix washout is completed, scheduled for 10/19/2021.  Continue PPI.  History of CVA (cerebrovascular accident): Holding aspirin and Plavix for now. - cont statin   Pressure ulcer- (present on admission) Stage II bilateral buttock ulcer, present on admission. Wound care consulted. Low-air-loss mattress requested. Continue dressing changes.   Pressure Injury 10/15/21 Buttocks Right;Left Stage 2 -  Partial thickness loss of dermis presenting as a shallow open injury with a red, pink wound bed without slough. pt with multiple areas of stage ii pressure injury on both sides of the buttocks, also (Active) 10/15/21 1635 Location: Buttocks Location Orientation: Right;Left Staging: Stage 2 -  Partial thickness loss of dermis presenting as a shallow open injury with a red, pink wound bed without slough. Wound Description (Comments): pt with multiple areas of stage ii pressure injury on both sides of the buttocks, also purple discoloration in area as well, sacral foam applied Present on Admission: Yes   AKI (acute kidney injury) ([place])- (present on admission) Baseline serum creatinine around 1.3.  On presentation serum creatinine 1.74.  Received IV fluids.  Now creatinine back to baseline of 1.3.   Chronic diastolic heart failure ([place])- (present on admission) Essential hypertension. Patient takes Lasix at home as needed.   Does not appear to be decompensated For now Lasix is on hold. Will continue Toprol-XL.   Morbid obesity ([place])- (present on admission) Body mass index is 50.22 kg/m.  Placing the patient at  high risk for poor outcome. Not on cpap at home   T2DM Not on meds at home.  Blood sugar controlled on SSI    DVT prophylaxis:    Code Status: Full Code  Family Communication:  None present at bedside.  Plan of care discussed  with patient in length and he/she verbalized understanding and agreed with it.  Status is: Inpatient  Remains inpatient appropriate because: Needs assessment by Dr. Sharol Given on Tuesday with  Estimated body mass index is 50.36 kg/m as calculated from the following:   Height as of this encounter: 5\' 10"  (1.778 m).   Weight as of this encounter: 159.2 kg.  Pressure Injury 10/15/21 Buttocks Right;Left Stage 2 -  Partial thickness loss of dermis presenting as a shallow open injury with a red, pink wound bed without slough. patchy areas of partial thickness skin loss related to moiture associated skin damage (Active)  10/15/21 1635  Location: Buttocks  Location Orientation: Right;Left  Staging: Stage 2 -  Partial thickness loss of dermis presenting as a shallow open injury with a red, pink wound bed without slough.  Wound Description (Comments): patchy areas of partial thickness skin loss related to moiture associated skin damage  Present on Admission: Yes   Nutritional Assessment: Body mass index is 50.36 kg/m.Marland Kitchen Seen by dietician.  I agree with the assessment and plan as outlined below: Nutrition Status:        . Skin Assessment: I have examined the patient's skin and I agree with the wound assessment as performed by the wound care RN as outlined below: Pressure Injury 10/15/21 Buttocks Right;Left Stage 2 -  Partial thickness loss of dermis presenting as a shallow open injury with a red, pink wound bed without slough. patchy areas of partial thickness skin loss related to moiture associated skin damage (Active)  10/15/21 1635  Location: Buttocks  Location Orientation: Right;Left  Staging: Stage 2 -  Partial thickness loss of dermis presenting as a shallow open injury with a red, pink wound bed without slough.  Wound Description (Comments): patchy areas of partial thickness skin loss related to moiture associated skin damage  Present on Admission: Yes    Consultants:   Orthopedics  Procedures:  None  Antimicrobials:  Anti-infectives (From admission, onward)    Start     Dose/Rate Route Frequency Ordered Stop   10/18/21 0330  vancomycin (VANCOCIN) IVPB 1000 mg/200 mL premix        1,000 mg 200 mL/hr over 60 Minutes Intravenous Every 12 hours 10/17/21 1135     10/16/21 1600  vancomycin (VANCOCIN) 2,500 mg in sodium chloride 0.9 % 500 mL IVPB  Status:  Discontinued        2,500 mg 250 mL/hr over 120 Minutes Intravenous Every 36 hours 10/15/21 0327 10/17/21 1135   10/16/21 1030  metroNIDAZOLE (FLAGYL) IVPB 500 mg        500 mg 100 mL/hr over 60 Minutes Intravenous Every 12 hours 10/16/21 0936     10/15/21 1000  ceFEPIme (MAXIPIME) 2 g in sodium chloride 0.9 % 100 mL IVPB        2 g 200 mL/hr over 30 Minutes Intravenous Every 8 hours 10/15/21 0327     10/15/21 0130  vancomycin (VANCOCIN) IVPB 1000 mg/200 mL premix  Status:  Discontinued        1,000 mg 200 mL/hr over 60 Minutes Intravenous  Once 10/15/21 0117 10/15/21 0129   10/15/21 0130  ceFEPIme (MAXIPIME) 2 g in sodium chloride 0.9 %  100 mL IVPB        2 g 200 mL/hr over 30 Minutes Intravenous  Once 10/15/21 0117 10/15/21 0918   10/15/21 0130  vancomycin (VANCOREADY) IVPB 2000 mg/400 mL        2,000 mg 200 mL/hr over 120 Minutes Intravenous  Once 10/15/21 0129 10/15/21 0918         Subjective: Seen and examined.  No new complaint other than left ankle pain.  Objective: Vitals:   10/16/21 1720 10/16/21 2052 10/17/21 0511 10/17/21 0754  BP: 134/74 136/75 133/77 140/75  Pulse: 71 73 76 77  Resp: 18 20 20 16   Temp: 98.5 F (36.9 C) 98.4 F (36.9 C) 98.2 F (36.8 C) (!) 97.4 F (36.3 C)  TempSrc: Oral Oral Oral Oral  SpO2: 92% 95% 91% 93%  Weight:      Height:        Intake/Output Summary (Last 24 hours) at 10/17/2021 1304 Last data filed at 10/17/2021 1127 Gross per 24 hour  Intake 1540 ml  Output 2150 ml  Net -610 ml   Filed Weights   10/14/21 1821 10/15/21 2159  Weight:  (!) 158.8 kg (!) 159.2 kg    Examination:  General exam: Appears calm and comfortable, morbidly obese Respiratory system: Clear to auscultation. Respiratory effort normal. Cardiovascular system: S1 & S2 heard, RRR. No JVD, murmurs, rubs, gallops or clicks. No pedal edema. Gastrointestinal system: Abdomen is nondistended, soft and nontender. No organomegaly or masses felt. Normal bowel sounds heard. Central nervous system: Alert and oriented. No focal neurological deficits. Extremities: Dressing in bilateral lower extremities. Psychiatry: Judgement and insight appear normal. Mood & affect appropriate.    Data Reviewed: I have personally reviewed following labs and imaging studies  CBC: Recent Labs  Lab 10/14/21 2035 10/15/21 0330 10/16/21 1221 10/17/21 0416  WBC 12.0* 10.3 8.6 8.9  NEUTROABS 9.6*  --   --   --   HGB 9.4* 9.5* 8.1* 8.6*  HCT 32.6* 31.5* 26.9* 28.3*  MCV 79.1* 78.9* 79.1* 79.1*  PLT 305 282 227 409   Basic Metabolic Panel: Recent Labs  Lab 10/14/21 2035 10/15/21 0330 10/16/21 0142 10/16/21 1221 10/17/21 0416  NA 133* 134*  --  136 136  K 4.0 3.8  --  4.3 4.3  CL 103 105  --  104 103  CO2 21* 23  --  24 24  GLUCOSE 151* 215*  --  196* 139*  BUN 71* 64*  --  42* 37*  CREATININE 1.74* 1.68* 1.33* 1.48* 1.30*  CALCIUM 8.4* 8.3*  --  8.7* 8.6*   GFR: Estimated Creatinine Clearance: 85 mL/min (A) (by C-G formula based on SCr of 1.3 mg/dL (H)). Liver Function Tests: Recent Labs  Lab 10/14/21 2035  AST 11*  ALT 14  ALKPHOS 51  BILITOT 0.4  PROT 7.1  ALBUMIN 2.5*   No results for input(s): LIPASE, AMYLASE in the last 168 hours. No results for input(s): AMMONIA in the last 168 hours. Coagulation Profile: No results for input(s): INR, PROTIME in the last 168 hours. Cardiac Enzymes: No results for input(s): CKTOTAL, CKMB, CKMBINDEX, TROPONINI in the last 168 hours. BNP (last 3 results) No results for input(s): PROBNP in the last 8760  hours. HbA1C: Recent Labs    10/17/21 0416  HGBA1C 7.3*   CBG: Recent Labs  Lab 10/16/21 1110 10/16/21 1722 10/16/21 2051 10/17/21 0823 10/17/21 1125  GLUCAP 197* 138* 156* 146* 169*   Lipid Profile: No results for input(s): CHOL, HDL,  LDLCALC, TRIG, CHOLHDL, LDLDIRECT in the last 72 hours. Thyroid Function Tests: No results for input(s): TSH, T4TOTAL, FREET4, T3FREE, THYROIDAB in the last 72 hours. Anemia Panel: Recent Labs    10/17/21 0416  VITAMINB12 185  FOLATE 7.0  FERRITIN 237  TIBC 227*  IRON 23*  RETICCTPCT 1.2   Sepsis Labs: Recent Labs  Lab 10/14/21 2035  LATICACIDVEN 1.4    Recent Results (from the past 240 hour(s))  Resp Panel by RT-PCR (Flu A&B, Covid) Nasopharyngeal Swab     Status: None   Collection Time: 10/14/21 11:36 PM   Specimen: Nasopharyngeal Swab; Nasopharyngeal(NP) swabs in vial transport medium  Result Value Ref Range Status   SARS Coronavirus 2 by RT PCR NEGATIVE NEGATIVE Final    Comment: (NOTE) SARS-CoV-2 target nucleic acids are NOT DETECTED.  The SARS-CoV-2 RNA is generally detectable in upper respiratory specimens during the acute phase of infection. The lowest concentration of SARS-CoV-2 viral copies this assay can detect is 138 copies/mL. A negative result does not preclude SARS-Cov-2 infection and should not be used as the sole basis for treatment or other patient management decisions. A negative result may occur with  improper specimen collection/handling, submission of specimen other than nasopharyngeal swab, presence of viral mutation(s) within the areas targeted by this assay, and inadequate number of viral copies(<138 copies/mL). A negative result must be combined with clinical observations, patient history, and epidemiological information. The expected result is Negative.  Fact Sheet for Patients:  EntrepreneurPulse.com.au  Fact Sheet for Healthcare Providers:   IncredibleEmployment.be  This test is no t yet approved or cleared by the [place] FDA and  has been authorized for detection and/or diagnosis of SARS-CoV-2 by FDA under an Emergency Use Authorization (EUA). This EUA will remain  in effect (meaning this test can be used) for the duration of the COVID-19 declaration under Section 564(b)(1) of the Act, 21 U.S.C.section 360bbb-3(b)(1), unless the authorization is terminated  or revoked sooner.       Influenza A by PCR NEGATIVE NEGATIVE Final   Influenza B by PCR NEGATIVE NEGATIVE Final    Comment: (NOTE) The Xpert Xpress SARS-CoV-2/FLU/RSV plus assay is intended as an aid in the diagnosis of influenza from Nasopharyngeal swab specimens and should not be used as a sole basis for treatment. Nasal washings and aspirates are unacceptable for Xpert Xpress SARS-CoV-2/FLU/RSV testing.  Fact Sheet for Patients: EntrepreneurPulse.com.au  Fact Sheet for Healthcare Providers: IncredibleEmployment.be  This test is not yet approved or cleared by the [place] FDA and has been authorized for detection and/or diagnosis of SARS-CoV-2 by FDA under an Emergency Use Authorization (EUA). This EUA will remain in effect (meaning this test can be used) for the duration of the COVID-19 declaration under Section 564(b)(1) of the Act, 21 U.S.C. section 360bbb-3(b)(1), unless the authorization is terminated or revoked.  Performed at [facility], [place] [address] [place], [place] [address]      Radiology Studies: No results found.  Scheduled Meds:  allopurinol  300 mg Oral Daily   atorvastatin  10 mg Oral Daily   docusate sodium  100 mg Oral BID   hydrALAZINE  50 mg Oral BID   insulin aspart  0-15 Units Subcutaneous TID WC   insulin glargine-yfgn  10 Units Subcutaneous Daily   metoprolol succinate  100 mg Oral Daily   pantoprazole  40 mg Oral BID   polyethylene  glycol  17 g Oral BID   Continuous Infusions:  ceFEPime (MAXIPIME) IV 2  g (10/17/21 2952)   metronidazole 500 mg (10/17/21 1148)   [START ON 10/18/2021] vancomycin       LOS: 2 days   Time spent: 35 minutes  Darliss Cheney, MD [facility]  10/17/2021, 1:04 PM  Please page via [facility] and do not message via secure chat for urgent patient care matters. Secure chat can be used for non urgent patient care matters.  How to contact the [facility] Attending or Consulting provider [place] or covering provider during after hours [place], for this patient?  Check the care team in [facility] and look for a) attending/consulting [facility] provider listed and b) the [facility] team listed. Page or secure chat [phone]. Log into [email or web address] and use [place] universal password to access. If you do not have the password, please contact the hospital operator. Locate the [facility] provider you are looking for under [facility] and page to a number that you can be directly reached. If you still have difficulty reaching the provider, please page the [facility] (Director on Call) for the Hospitalists listed on amion for assistance.

## 2021-10-17 NOTE — Progress Notes (Signed)
° °   Progress Note   Subjective  Chief Complaint: Anemia  Patient lying in bed, no acute distress. States he has not had a bowel movement since 01/27 which is normal for him. Denies any abdominal pain, nausea, vomiting.    Objective   Vital signs in last 24 hours: Temp:  [97.4 F (36.3 C)-98.5 F (36.9 C)] 97.4 F (36.3 C) (01/30 0754) Pulse Rate:  [71-77] 77 (01/30 0754) Resp:  [16-20] 16 (01/30 0754) BP: (133-140)/(74-77) 140/75 (01/30 0754) SpO2:  [91 %-95 %] 93 % (01/30 0754) Last BM Date: 10/13/21  General: morbidly obese male in no acute distress  Heart:  regular rate and rhythm Pulm: Clear anteriorly; no wheezing Abdomen:  Soft, Obese AB, Normal bowel sounds.  no  tenderness . Without guarding and Without rebound, Extremities:  Legs wrapped, severe lymphedema noted. Neurologic:  Alert and  oriented x4;  grossly normal neurologically.  Psychiatric: Cooperative. Normal mood and affect.  Intake/Output from previous day: 01/29 0701 - 01/30 0700 In: 1660 [P.O.:660; IV Piggyback:1000] Out: 1300 [Urine:1300] Intake/Output this shift: No intake/output data recorded.  Lab Results: Recent Labs    10/15/21 0330 10/16/21 1221 10/17/21 0416  WBC 10.3 8.6 8.9  HGB 9.5* 8.1* 8.6*  HCT 31.5* 26.9* 28.3*  PLT 282 227 235   BMET Recent Labs    10/15/21 0330 10/16/21 0142 10/16/21 1221 10/17/21 0416  NA 134*  --  136 136  K 3.8  --  4.3 4.3  CL 105  --  104 103  CO2 23  --  24 24  GLUCOSE 215*  --  196* 139*  BUN 64*  --  42* 37*  CREATININE 1.68* 1.33* 1.48* 1.30*  CALCIUM 8.3*  --  8.7* 8.6*   LFT Recent Labs    10/14/21 2035  PROT 7.1  ALBUMIN 2.5*  AST 11*  ALT 14  ALKPHOS 51  BILITOT 0.4   PT/INR No results for input(s): LABPROT, INR in the last 72 hours.  Studies/Results: No results found.    Impression/Plan:   66 year old male with microcytic anemia and heavy positive stools, extensive NSAID use, no prior colon cancer screening, chronic  aspirin and Plavix history of CVA.  Microcytic progressive anemia in setting of aspirin and Plavix, NSAIDS HGB 8.6 MCV 79.1  WBC 8.9 Platelets 235 10/17/2021 Iron 23 Ferritin 237 B12 185 - plan for colon/EGD after plavix wash out-last dose plavix 01/27 -continue supportive care --Continue to monitor H&H with transfusion as needed to maintain hemoglobin greater than 7.  Septic arthritis and osteomyelitis due to injury in December MRI left lower extremity confirmed osteomyelitis, being followed by ID/Dr. Sharol Given  History of CVA Aspirin and Plavix on hold- last dose 10/14/21  Constipation Appears chronic, miralax twice daily, may need longer prep/dulcolax prior to colon  Comorbid conditions include: Chronic diastolic heart failure Diabetes Morbid obesity OSA not on CPAP  Future Appointments  Date Time Provider Foreman  10/18/2021  2:30 PM Valinda Party, DO [facility]      LOS: 2 days   Vladimir Crofts  10/17/2021, 10:47 AM

## 2021-10-17 NOTE — Consult Note (Addendum)
[facility] Nurse Consult Note: Reason for Consult: Consult requested for bilat legs and buttocks.   Wound type: Bilat buttocks dark purple in color with patchy areas of pink moist partial thickness skin loss, shaggy irregular boarders, bleeds easily when touched.  Appearance is consistent with chronic tissue damage, which occurs when patients spend a large amount of time sitting in a recliner chair prior to admission. Skin is red, moist and macerated to bilat buttocks and inner posterior thighs with mod amt tan drainage.   ICD-10 CM Codes for Irritant Dermatitis L24A9 - Due to friction or contact with other specified body fluids L30.4  - Erythema intertrigo; chafing of the skin, dermatitis due to sweating and friction  Measurement: Affected area is approx 20X20X.1cm; pt has a high BMI. Left leg with obvious deformity to ankle/foot and large amt edema and multiple skin folds. Left anterior calf anterior and posterior leg with full thickness skin loss from ankle to below knee, red and moist and mod amt bloody drainage, painful to touch. Right anterior calf anterior and posterior leg with full thickness skin loss from ankle to below knee, red and moist and mod amt tan-pink drainage, painful to touch. Right foot with generalized edema and multiple skin folds. Dressing procedure/placement/frequency: Topical treatment orders provided for bedside nurses to perform as follows to absorb drainage and provide antimicrobial benefits: 1. Left leg: apply xeroform gauze, then ABD pads and kerlex.  Moisten with NS each time to remove and pat dry with 4X4s before applying dressings. 2. Right leg: apply Aquacel Kellie Simmering # [phone]) then cover with ABD pads and kerlex. Moisten with NS each time to remove and pat dry with 4X4s before applying dressings. 3. Foam dressings to buttocks, change Q 3 days or PRN soiling  Pt has osteomyelitis to left leg, according to MRI results.  This complex medical condition is beyond the scope of  practice for [place] nurses.  A consult from Dr Sharol Given of the ortho team is pending on Tues, according to progress notes.  Please refer to their team for further plan of care.  Please re-consult if further assistance is needed.  Thank-you,  Julien Girt MSN, [place], [place], [place], [place]

## 2021-10-17 NOTE — Progress Notes (Signed)
Pharmacy Antibiotic Note  [name] [name] is a 66 y.o. male admitted on 10/14/2021 with medical history significant for chronic venous stasis, lymphedema, CVA, asbestosis, hypertension, OSA not on CPAP, who presents with concerns of worsening left ankle pain. Pharmacy has been consulted to dose cefepime and vancomycin for osteomyelitis, septic arthritis. Also on Flagyl per MD. Day #3 of antibiotics. SCr trended down to 1.3 today.  Plan: Adjust vancomycin to 1g IV q12h. Goal AUC 400-550. Expected AUC: 480 SCr used: 1.3 Flagyl 500mg  IV q12h Cefepime 2gm IV q8h Monitor clinical progress, c/s, renal function F/u de-escalation plan/LOT, vancomycin levels as indicated   Height: 5\' 10"  (177.8 cm) Weight: (!) 159.2 kg (351 lb) IBW/kg (Calculated) : 73  Temp (24hrs), Avg:98.1 F (36.7 C), Min:97.4 F (36.3 C), Max:98.5 F (36.9 C)  Recent Labs  Lab 10/14/21 2035 10/15/21 0330 10/16/21 0142 10/16/21 1221 10/17/21 0416  WBC 12.0* 10.3  --  8.6 8.9  CREATININE 1.74* 1.68* 1.33* 1.48* 1.30*  LATICACIDVEN 1.4  --   --   --   --      Estimated Creatinine Clearance: 85 mL/min (A) (by C-G formula based on SCr of 1.3 mg/dL (H)).    No Known Allergies  [name], PharmD, BCPS Please check AMION for all [place] contact numbers Clinical Pharmacist 10/17/2021 11:33 AM

## 2021-10-18 ENCOUNTER — Encounter (HOSPITAL_BASED_OUTPATIENT_CLINIC_OR_DEPARTMENT_OTHER): Payer: Medicare Other | Attending: Internal Medicine | Admitting: Internal Medicine

## 2021-10-18 DIAGNOSIS — M79605 Pain in left leg: Secondary | ICD-10-CM | POA: Diagnosis not present

## 2021-10-18 DIAGNOSIS — M869 Osteomyelitis, unspecified: Secondary | ICD-10-CM | POA: Diagnosis not present

## 2021-10-18 LAB — TYPE AND SCREEN
ABO/RH(D): B POS
Antibody Screen: NEGATIVE
Unit division: 0
Unit division: 0

## 2021-10-18 LAB — BASIC METABOLIC PANEL
Anion gap: 10 (ref 5–15)
BUN: 31 mg/dL — ABNORMAL HIGH (ref 8–23)
CO2: 22 mmol/L (ref 22–32)
Calcium: 8.6 mg/dL — ABNORMAL LOW (ref 8.9–10.3)
Chloride: 103 mmol/L (ref 98–111)
Creatinine, Ser: 1.14 mg/dL (ref 0.61–1.24)
GFR, Estimated: >60 mL/min (ref >60)
Glucose, Bld: 162 mg/dL — ABNORMAL HIGH (ref 70–99)
Potassium: 4.5 mmol/L (ref 3.5–5.1)
Sodium: 135 mmol/L (ref 135–145)

## 2021-10-18 LAB — CBC
HCT: 27.9 % — ABNORMAL LOW (ref 39.0–52.0)
Hemoglobin: 8.4 g/dL — ABNORMAL LOW (ref 13.0–17.0)
MCH: 23.5 pg — ABNORMAL LOW (ref 26.0–34.0)
MCHC: 30.1 g/dL (ref 30.0–36.0)
MCV: 78.2 fL — ABNORMAL LOW (ref 80.0–100.0)
Platelets: 225 10*3/uL (ref 150–400)
RBC: 3.57 MIL/uL — ABNORMAL LOW (ref 4.22–5.81)
RDW: 18.6 % — ABNORMAL HIGH (ref 11.5–15.5)
WBC: 8.1 10*3/uL (ref 4.0–10.5)
nRBC: 0 % (ref 0.0–0.2)

## 2021-10-18 LAB — SURGICAL PCR SCREEN
MRSA, PCR: NEGATIVE
Staphylococcus aureus: POSITIVE — AB

## 2021-10-18 LAB — BPAM RBC
Blood Product Expiration Date: 202302032359
Blood Product Expiration Date: 202302032359
Unit Type and Rh: 1700
Unit Type and Rh: 1700

## 2021-10-18 LAB — GLUCOSE, CAPILLARY
Glucose-Capillary: 144 mg/dL — ABNORMAL HIGH (ref 70–99)
Glucose-Capillary: 148 mg/dL — ABNORMAL HIGH (ref 70–99)
Glucose-Capillary: 143 mg/dL — ABNORMAL HIGH (ref 70–99)
Glucose-Capillary: 137 mg/dL — ABNORMAL HIGH (ref 70–99)

## 2021-10-18 MED ORDER — POVIDONE-IODINE 10 % EX SWAB: 2.0000 "application " | Freq: Once | CUTANEOUS | Status: DC

## 2021-10-18 MED ORDER — PEG-KCL-NACL-NASULF-NA ASC-C 100 G PO SOLR: 1.0000 | Freq: Once | ORAL | Status: DC

## 2021-10-18 MED ORDER — SODIUM CHLORIDE 0.9% IV SOLUTION: Freq: Once | INTRAVENOUS | Status: DC

## 2021-10-18 MED ORDER — METOCLOPRAMIDE HCL 5 MG/ML IJ SOLN
10.0000 mg | Freq: Four times a day (QID) | INTRAMUSCULAR | Status: AC
  Administered 2021-10-18 (×2): 10 mg via INTRAVENOUS
  Filled 2021-10-18 (×2): qty 2

## 2021-10-18 MED ORDER — PEG-KCL-NACL-NASULF-NA ASC-C 100 G PO SOLR
0.5000 | Freq: Once | ORAL | Status: AC
Start: 2021-10-18 — End: 2021-10-18
  Administered 2021-10-18: 100 g via ORAL

## 2021-10-18 MED ORDER — CHLORHEXIDINE GLUCONATE 4 % EX LIQD
60.0000 mL | Freq: Once | CUTANEOUS | Status: DC
  Filled 2021-10-18: qty 60

## 2021-10-18 MED ORDER — PEG-KCL-NACL-NASULF-NA ASC-C 100 G PO SOLR
0.5000 | Freq: Once | ORAL | Status: AC
  Administered 2021-10-18: 100 g via ORAL
  Filled 2021-10-18: qty 1

## 2021-10-18 MED ORDER — MUPIROCIN 2 % EX OINT
1.0000 "application " | TOPICAL_OINTMENT | Freq: Two times a day (BID) | CUTANEOUS | Status: AC
  Administered 2021-10-18 – 2021-10-23 (×9): 1 via NASAL
  Filled 2021-10-18 (×3): qty 22

## 2021-10-18 MED ORDER — CHLORHEXIDINE GLUCONATE CLOTH 2 % EX PADS
6.0000 | MEDICATED_PAD | Freq: Every day | CUTANEOUS | Status: AC
  Administered 2021-10-19 – 2021-10-23 (×5): 6 via TOPICAL

## 2021-10-18 MED ORDER — TRANEXAMIC ACID-NACL 1000-0.7 MG/100ML-% IV SOLN
1000.0000 mg | INTRAVENOUS | Status: DC
  Filled 2021-10-18: qty 100

## 2021-10-18 MED ORDER — BISACODYL 5 MG PO TBEC
20.0000 mg | DELAYED_RELEASE_TABLET | Freq: Once | ORAL | Status: AC
  Administered 2021-10-18: 20 mg via ORAL
  Filled 2021-10-18: qty 4

## 2021-10-18 MED ORDER — TRANEXAMIC ACID 1000 MG/10ML IV SOLN
2000.0000 mg | INTRAVENOUS | Status: DC
  Filled 2021-10-18: qty 20

## 2021-10-18 MED ORDER — DOCUSATE SODIUM 100 MG PO CAPS
100.0000 mg | ORAL_CAPSULE | Freq: Two times a day (BID) | ORAL | Status: DC
  Administered 2021-10-20 – 2021-10-23 (×3): 100 mg via ORAL
  Filled 2021-10-18 (×9): qty 1

## 2021-10-18 MED ORDER — POLYETHYLENE GLYCOL 3350 17 G PO PACK
17.0000 g | PACK | Freq: Two times a day (BID) | ORAL | Status: DC
Start: 2021-10-20 — End: 2021-10-25
  Administered 2021-10-23 (×2): 17 g via ORAL
  Filled 2021-10-18 (×9): qty 1

## 2021-10-18 NOTE — Consult Note (Signed)
[place] Nurse wound follow up Dr Sharol Given of the ortho service performed a consult this morning and plans to take the patient to the OR for amputation.  Please refer to his team for further questions regarding plan of care.  Please re-consult if further assistance is needed.  Thank-you,  Julien Girt MSN, [place], [place], [place], [place]

## 2021-10-18 NOTE — H&P (View-Only) (Signed)
ORTHOPAEDIC CONSULTATION  REQUESTING PHYSICIAN: Darliss Cheney, MD  Chief Complaint: Chronic painful draining abscess left ankle.  HPI: [name] [name] is a 66 y.o. male who presents with chronic venous insufficiency with a draining abscess left ankle.  Patient states that he has been going to the wound center at [place] long that around Thanksgiving the anterior ulcer over the left ankle was probed there was purulent drainage.  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[facility])    Essential hypertension    History of nephrolithiasis    History of open leg wound    History of stroke July 2004   Obstructive sleep apnea    TIA (transient ischemic attack)    Varicose veins    Laser ablation of left great saphenous vein 2009   Past Surgical History:  Procedure Laterality Date   CYSTOSCOPY WITH RETROGRADE PYELOGRAM, URETEROSCOPY AND STENT PLACEMENT Right 08/02/2015   Procedure: [place], URETEROSCOPY , STONE EXTRACTION AND STENT PLACEMENT;  Surgeon: Cleon Gustin, MD;  Location: [facility];  Service: Urology;  Laterality: Right;   HOLMIUM LASER APPLICATION Right 76/72/0947   Procedure: HOLMIUM LASER APPLICATION;  Surgeon: Cleon Gustin, MD;  Location: [facility];  Service: Urology;  Laterality: Right;   KIDNEY STONE SURGERY     VASECTOMY  1984   VEIN SURGERY Left 06/2010   Laser ablation   Social History   Socioeconomic History   Marital status: Married    Spouse name: Not on file   Number of children: 4   Years of education: 14   Highest education level: Not on file  Occupational History   Occupation: controll Financial planner     Employer: [employer]  Tobacco Use   Smoking status: Never   Smokeless tobacco: Never  Substance and Sexual Activity   Alcohol use: No    Alcohol/week: 0.0 standard drinks   Drug use: No   Sexual activity: Not on file  Other Topics Concern   Not on file  Social History Narrative   Not on file   Social  Determinants of Health   Financial Resource Strain: Not on file  Food Insecurity: Not on file  Transportation Needs: Not on file  Physical Activity: Not on file  Stress: Not on file  Social Connections: Not on file   Family History  Problem Relation Age of Onset   Bone cancer Mother    Hypertension Father    Pancreatic cancer Maternal Grandmother    Stroke Paternal Grandmother    Diabetes Paternal Grandfather    Heart attack Brother    Hypertension Brother    Heart attack Sister    - negative except otherwise stated in the family history section No Known Allergies Prior to Admission medications   Medication Sig Start Date End Date Taking? Authorizing Provider  acetaminophen (TYLENOL) 500 MG tablet Take 1,000 mg by mouth daily as needed for moderate pain.   Yes [provider]  allopurinol (ZYLOPRIM) 300 MG tablet Take 300 mg by mouth daily.   Yes [provider]  aspirin 325 MG tablet Take 325 mg by mouth daily.   Yes [provider]  atorvastatin (LIPITOR) 10 MG tablet Take 10 mg by mouth daily.   Yes [provider]  clopidogrel (PLAVIX) 75 MG tablet Take 75 mg by mouth daily.   Yes [provider]  diclofenac (VOLTAREN) 75 MG EC tablet Take 75 mg by mouth 2 (two) times daily. 09/29/21  Yes  [provider]  furosemide (LASIX) 40 MG tablet Take 80 mg by mouth daily as needed for fluid or edema.   Yes [provider]  hydrALAZINE (APRESOLINE) 50 MG tablet TAKE 1 TABLET BY MOUTH TWICE A DAY. NEED OFFICE VISIT FOR FURTHER REFILLS Patient taking differently: Take 50-100 mg by mouth 2 (two) times daily. Increased by Dr. Quintin Alto 03/08/18  Yes Satira Sark, MD  metoprolol succinate (TOPROL-XL) 100 MG 24 hr tablet Take 100 mg by mouth daily. Take with or immediately following a meal.   Yes [provider]  olmesartan-hydrochlorothiazide (BENICAR HCT) 40-25 MG tablet Take 1 tablet by mouth daily. 04/22/18  Yes  [provider]  aspirin EC 81 MG tablet Take 1 tablet (81 mg total) by mouth daily. Patient not taking: Reported on 04/09/2021 05/10/18   Satira Sark, MD   No results found. - pertinent xrays, CT, MRI studies were reviewed and independently interpreted  Positive ROS: All other systems have been reviewed and were otherwise negative with the exception of those mentioned in the HPI and as above.  Physical Exam: General: Alert, no acute distress Psychiatric: Patient is competent for consent with normal mood and affect Lymphatic: No axillary or cervical lymphadenopathy Cardiovascular: No pedal edema Respiratory: No cyanosis, no use of accessory musculature GI: No organomegaly, abdomen is soft and non-tender    Images:  @ENCIMAGES @  Labs:  Lab Results  Component Value Date   HGBA1C 7.3 (H) 10/17/2021   HGBA1C 6.5 (H) 09/12/2019   CRP 17.6 (H) 09/14/2019   CRP 14.7 (H) 09/13/2019   CRP 14.6 (H) 09/12/2019   REPTSTATUS 09/14/2019 FINAL 09/09/2019   CULT  09/09/2019    NO GROWTH 5 DAYS Performed at [facility], [address], [place], [place] [address]     Lab Results  Component Value Date   ALBUMIN 2.5 (L) 10/14/2021   ALBUMIN 3.8 04/09/2021   ALBUMIN 2.9 (L) 09/14/2019     CBC EXTENDED Latest Ref Rng & Units 10/18/2021 10/17/2021 10/17/2021  WBC 4.0 - 10.5 K/uL 8.1 8.9 -  RBC 4.22 - 5.81 MIL/uL 3.57(L) 3.58(L) 3.67(L)  HGB 13.0 - 17.0 g/dL 8.4(L) 8.6(L) -  HCT 39.0 - 52.0 % 27.9(L) 28.3(L) -  PLT 150 - 400 K/uL 225 235 -  NEUTROABS 1.7 - 7.7 K/uL - - -  LYMPHSABS 0.7 - 4.0 K/uL - - -    Neurologic: Patient does not have protective sensation bilateral lower extremities.   MUSCULOSKELETAL:   Skin: Examination patient has a necrotic ulcer over the anterior left ankle.  There is brawny edema in both legs with chronic venous insufficiency and swelling in the forefoot pronounced bilaterally.  Patient has a strong palpable dorsalis pedis pulse  bilaterally.  Review of the x-rays show arthritic changes of the ankle.  Review of the MRI scan shows advanced chronic osteomyelitis of the tibia talus and hindfoot.  Patient's albumin is 2.5.  Hemoglobin 8.4.  Hemoglobin A1c 7.3 with a C-reactive protein of 17.6.  Patient states that he has not been able to walk for months due to the pain.  Patient does have active extension of the left knee.  Assessment: Assessment: Chronic osteomyelitis left ankle and hindfoot with a chronic draining cloaca wound.  Plan: Discussed treatment options.  Recommended proceeding with a transtibial amputation.  Risk and benefits were discussed including risk of the wound not healing need for additional surgery.  Patient states he understands wished to proceed with surgery.  We will plan  for surgery tomorrow Wednesday.  Patient most likely will need discharge to skilled nursing.  Thank you for the consult and the opportunity to see [name] [name], [place] [phone] 7:09 AM

## 2021-10-18 NOTE — Progress Notes (Signed)
° °   Progress Note   Subjective  Chief Complaint: Anemia  He has not had a BM since 1/27. Denies ab pain.    Objective   Vital signs in last 24 hours: Temp:  [98 F (36.7 C)-98.7 F (37.1 C)] 98.7 F (37.1 C) (01/31 0810) Pulse Rate:  [69-72] 70 (01/31 0810) Resp:  [18] 18 (01/31 0810) BP: (137-149)/(78-83) 149/83 (01/31 0810) SpO2:  [89 %-93 %] 93 % (01/31 0810) Last BM Date: 10/13/21  General: morbidly obese male in no acute distress  Heart:  regular rate and rhythm Pulm: Clear anteriorly; no wheezing Abdomen:  Soft, Obese AB, Normal bowel sounds.  no  tenderness . Without guarding and Without rebound, Extremities:  Legs wrapped, severe lymphedema noted. Neurologic:  Alert and  oriented x4;  grossly normal neurologically.  Psychiatric: Cooperative. Normal mood and affect.  Intake/Output from previous day: 01/30 0701 - 01/31 0700 In: 1120 [P.O.:720; IV Piggyback:400] Out: 1700 [Urine:1700] Intake/Output this shift: No intake/output data recorded.  Lab Results: Recent Labs    10/16/21 1221 10/17/21 0416 10/18/21 0159  WBC 8.6 8.9 8.1  HGB 8.1* 8.6* 8.4*  HCT 26.9* 28.3* 27.9*  PLT 227 235 225   BMET Recent Labs    10/16/21 1221 10/17/21 0416 10/18/21 0159  NA 136 136 135  K 4.3 4.3 4.5  CL 104 103 103  CO2 24 24 22   GLUCOSE 196* 139* 162*  BUN 42* 37* 31*  CREATININE 1.48* 1.30* 1.14  CALCIUM 8.7* 8.6* 8.6*   LFT No results for input(s): PROT, ALBUMIN, AST, ALT, ALKPHOS, BILITOT, BILIDIR, IBILI in the last 72 hours.  PT/INR No results for input(s): LABPROT, INR in the last 72 hours.  Studies/Results: No results found.    Impression/Plan:   66 year old male w/hx of CVA on asp/plavix, HFpE, OSA not on CPAP, DM and morbid obesity p/w microcytic anemia and intermittent rectal bleeding. Extensive NSAID use, no prior colon cancer screening.   IDA in setting of aspirin and Plavix, NSAIDS. Found to have Hb drop from 13.3 to 9.4 upon arrival. Blood  counts have dipped slightly since arrival but have stabilized since then. Low iron saturation. Ferritin 237, low serum iron.  - Plan for colon/EGD tomorrow after plavix wash out. Discussed the procedures extensively with the patient, he would like to proceed with figuring what is going on with the low blood counts. I suspect that the patient will have difficulty with his prep since he has underlying constipation issues at a baseline. If he is not able to prep by tomorrow, then can plan to do EGD alone to start with for evaluation.  - continue supportive care - Trend Hb. 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 Sharol Given. Patient states that he may be going for amputation tomorrow  History of CVA Aspirin and Plavix on hold- last dose 10/14/21  Constipation Appears chronic, miralax twice daily, may need longer prep/dulcolax prior to colon  Comorbid conditions include: Chronic diastolic heart failure Diabetes Morbid obesity OSA not on CPAP  Future Appointments  Date Time Provider [place]  10/18/2021  2:30 PM Valinda Party, DO [facility]      LOS: 3 days   Sharyn Creamer  10/18/2021, 9:57 AM

## 2021-10-18 NOTE — Progress Notes (Signed)
Occupational Therapy Treatment Patient Details Name: [name] [name] MRN: [id number] DOB: 09/19/1955 Today's Date: 10/18/2021   History of present illness 66 y/o M presenting to [facility] n 1/27 with L Leg pain x1 month, reports turning his L ankle when getting into his car in December, has not been able to bear weight thorugh LLE since. PMH includes morbid obesity, CHF, HTN, and CVA   OT comments  Pt progressing today towards goals, able to complete UE dressing sitting EOB min A, requires mod -max A +2 for bed mobility, able to sit EOB for ~15 mins with bedrail support, able to reach outside BOS for UE exercise during session without LOB. Pt asking questions regarding BKA surgery planned for tomorrow, provided some preemptive education regarding phantom sensations/pain and desensitization. Pt presenting with impairments listed below, will follow acutely. Recommend SNF at d/c.   Recommendations for follow up therapy are one component of a multi-disciplinary discharge planning process, led by the attending physician.  Recommendations may be updated based on patient status, additional functional criteria and insurance authorization.    Follow Up Recommendations  Skilled nursing-short term rehab (<3 hours/day)    Assistance Recommended at Discharge Intermittent Supervision/Assistance  Patient can return home with the following  A lot of help with bathing/dressing/bathroom;Two people to help with walking and/or transfers;Assistance with cooking/housework;Help with stairs or ramp for entrance;Assist for transportation   Equipment Recommendations  None recommended by OT;Other (comment) (defer to next venue of care)    Recommendations for Other Services PT consult    Precautions / Restrictions Precautions Precautions: Fall Restrictions Weight Bearing Restrictions: No       Mobility Bed Mobility Overal bed mobility: Needs Assistance Bed Mobility: Sit to Supine, Supine to Sit     Supine to  sit: Mod assist, +2 for physical assistance Sit to supine: +2 for physical assistance, Max assist   General bed mobility comments: Assist for LLE's off edge of bed, use of bed pad to scoot hips forward. Difficulty bending knees, +2 assist for LE back into bed. Verbal cues needed for use of bed rails to help scoot up in bed +2 with use of bed pad to help scoot up in bed.    Transfers                   General transfer comment: deferred     Balance Overall balance assessment: Needs assistance Sitting-balance support: Feet supported, Bilateral upper extremity supported Sitting balance-Leahy Scale: Fair Sitting balance - Comments: sits EOB with ability to reach within BOS, alternating UE support on bed rails Postural control: Posterior lean                                 ADL either performed or assessed with clinical judgement   ADL Overall ADL's : Needs assistance/impaired         Upper Body Bathing: Maximal assistance;Sitting Upper Body Bathing Details (indicate cue type and reason): to wash back sitting EOB     Upper Body Dressing : Minimal assistance;Sitting Upper Body Dressing Details (indicate cue type and reason): able to doff/don gown                        Extremity/Trunk Assessment Upper Extremity Assessment Upper Extremity Assessment: Overall WFL for tasks assessed   Lower Extremity Assessment Lower Extremity Assessment: Defer to PT evaluation  Vision   Vision Assessment?: No apparent visual deficits   Perception     Praxis      Cognition Arousal/Alertness: Awake/alert Behavior During Therapy: WFL for tasks assessed/performed Overall Cognitive Status: Within Functional Limits for tasks assessed                                 General Comments: slow to respond        Exercises Exercises: General Upper Extremity General Exercises - Upper Extremity Shoulder Flexion: AROM, 5 reps, Both, Seated     Shoulder Instructions       General Comments VSS on RA    Pertinent Vitals/ Pain       Pain Assessment Pain Assessment: Faces Pain Score: 6  Pain Location: LLE with movement Pain Descriptors / Indicators: Grimacing, Guarding Pain Intervention(s): Limited activity within patient's tolerance, Monitored during session, Repositioned  Home Living Family/patient expects to be discharged to:: Private residence Living Arrangements: Alone Available Help at Discharge: Neighbor;Friend(s);Family;Other (Comment);Personal care attendant Type of Home: House Home Access: Ramped entrance     Home Layout: One level     Bathroom Shower/Tub: Astronomer Accessibility: No   Home Equipment: BSC/3in1;Wheelchair - Engineer, manufacturing systems (2 wheels)   Additional Comments: sleeps in lift chair      Prior Functioning/Environment              Frequency  Min 2X/week        Progress Toward Goals  OT Goals(current goals can now be found in the care plan section)  Progress towards OT goals: Progressing toward goals  Acute Rehab OT Goals Patient Stated Goal: to get better OT Goal Formulation: With patient Time For Goal Achievement: 10/30/21 Potential to Achieve Goals: Good  Plan Discharge plan remains appropriate;Frequency remains appropriate    Co-evaluation    PT/OT/SLP Co-Evaluation/Treatment: Yes Reason for Co-Treatment: For patient/therapist safety;To address functional/ADL transfers PT goals addressed during session: Mobility/safety with mobility OT goals addressed during session: ADL's and self-care      AM-PAC OT "6 Clicks" Daily Activity     Outcome Measure   Help from another person eating meals?: None Help from another person taking care of personal grooming?: None Help from another person toileting, which includes using toliet, bedpan, or urinal?: Total Help from another person bathing (including washing, rinsing, drying)?: Total Help from  another person to put on and taking off regular upper body clothing?: A Little Help from another person to put on and taking off regular lower body clothing?: Total 6 Click Score: 14    End of Session    OT Visit Diagnosis: Unsteadiness on feet (R26.81);Muscle weakness (generalized) (M62.81)   Activity Tolerance Patient tolerated treatment well   Patient Left in bed;with call bell/phone within reach   Nurse Communication Mobility status        Time: 1404-1430 OT Time Calculation (min): 26 min  Charges: OT General Charges $OT Visit: 1 Visit OT Treatments $Self Care/Home Management : 8-22 mins  Lynnda Child, OTD, OTR/L Acute Rehab [phone] [place] 10/18/2021, 3:18 PM

## 2021-10-18 NOTE — Progress Notes (Signed)
PROGRESS NOTE    [name] [name]  TIR:[id number] DOB: Mar 03, 1956 DOA: 10/14/2021 PCP: Manon Hilding, MD   Brief Narrative:  [name] [name] is a 66 y.o. male with medical history significant for chronic venous stasis, lymphedema, CVA, asbestosis, hypertension, OSA not on CPAP, who presents with concerns of worsening left ankle pain. 08/27/2021 feels he rolled his ankle when standing. 09/15/2021 seen by orthopedics, x-ray performed.  Diagnosed to have a sprain and ankle 12/28 seen by wound care at which time patient complains that he is barely able to walk. 1/27 presents to ER with worsening left leg pain and redness.  Admitted for cellulitis.  MRI ordered. 1/28 MRI positive for osteomyelitis of the ankle joint as well as hindfoot and septic arthritis.  Orthopedic consulted.   Assessment & Plan:   Principal Problem:   Osteomyelitis of ankle and foot (HCC) Active Problems:   Morbid obesity (HCC)   OSA (obstructive sleep apnea)   Essential hypertension   Chronic diastolic heart failure (HCC)   AKI (acute kidney injury) (HCC)   Anemia   Pressure ulcer   History of CVA (cerebrovascular accident)   Septic arthritis of ankle and foot region [facility])   Left leg pain  Osteomyelitis of left ankle and foot (HCC)/septic arthritis- (present on admission) Has chronic venous stasis as well as severe lymphedema. Left leg has weeping ulcers with purulent and necrotic areas. Started on broad-spectrum IV antibiotics. MRI lower extremity confirms presence of osteomyelitis of the ankle and foot area as well as septic arthritis of the multiple ankle joints.  Seen by orthopedics.  Plan for left BKA tomorrow.  Continue antibiotics.   Lower GI bleed? Patient says his home nurses have noted intermittent red blood in stool for the past month or so. Hgb here is 9s from baseline of normal. No hematemesis or melena.  Hemoglobin stable between 8-9. -Assessed by GI.  Holding plavix and aspirin.  Plan for EGD  and colonoscopy tomorrow.  Continue PPI.  History of CVA (cerebrovascular accident): Holding aspirin and Plavix for now. - cont statin   Pressure ulcer- (present on admission) Stage II bilateral buttock ulcer, present on admission. Wound care consulted. Low-air-loss mattress requested. Continue dressing changes.   Pressure Injury 10/15/21 Buttocks Right;Left Stage 2 -  Partial thickness loss of dermis presenting as a shallow open injury with a red, pink wound bed without slough. pt with multiple areas of stage ii pressure injury on both sides of the buttocks, also (Active) 10/15/21 1635 Location: Buttocks Location Orientation: Right;Left Staging: Stage 2 -  Partial thickness loss of dermis presenting as a shallow open injury with a red, pink wound bed without slough. Wound Description (Comments): pt with multiple areas of stage ii pressure injury on both sides of the buttocks, also purple discoloration in area as well, sacral foam applied Present on Admission: Yes   AKI (acute kidney injury) ([place])- (present on admission) Baseline serum creatinine around 1.3.  On presentation serum creatinine 1.74.  Received IV fluids.  Now much better than his baseline.   Chronic diastolic heart failure ([place])- (present on admission) Essential hypertension. Patient takes Lasix at home as needed.   Does not appear to be decompensated For now Lasix is on hold. Will continue Toprol-XL.   Morbid obesity ([place])- (present on admission) Body mass index is 50.22 kg/m.  Placing the patient at high risk for poor outcome. Not on cpap at home   T2DM Not on meds at home.  Blood sugar controlled on  SSI    DVT prophylaxis:    Code Status: Full Code  Family Communication:  None present at bedside.  Plan of care discussed with patient in length and he/she verbalized understanding and agreed with it.  Status is: Inpatient  Remains inpatient appropriate because: Needs assessment by Dr. Sharol Given on Tuesday  with  Estimated body mass index is 50.36 kg/m as calculated from the following:   Height as of this encounter: 5' 10"  (1.778 m).   Weight as of this encounter: 159.2 kg.  Pressure Injury 10/15/21 Buttocks Right;Left Stage 2 -  Partial thickness loss of dermis presenting as a shallow open injury with a red, pink wound bed without slough. patchy areas of partial thickness skin loss related to moiture associated skin damage (Active)  10/15/21 1635  Location: Buttocks  Location Orientation: Right;Left  Staging: Stage 2 -  Partial thickness loss of dermis presenting as a shallow open injury with a red, pink wound bed without slough.  Wound Description (Comments): patchy areas of partial thickness skin loss related to moiture associated skin damage  Present on Admission: Yes   Nutritional Assessment: Body mass index is 50.36 kg/m.Marland Kitchen Seen by dietician.  I agree with the assessment and plan as outlined below: Nutrition Status:        . Skin Assessment: I have examined the patient's skin and I agree with the wound assessment as performed by the wound care RN as outlined below: Pressure Injury 10/15/21 Buttocks Right;Left Stage 2 -  Partial thickness loss of dermis presenting as a shallow open injury with a red, pink wound bed without slough. patchy areas of partial thickness skin loss related to moiture associated skin damage (Active)  10/15/21 1635  Location: Buttocks  Location Orientation: Right;Left  Staging: Stage 2 -  Partial thickness loss of dermis presenting as a shallow open injury with a red, pink wound bed without slough.  Wound Description (Comments): patchy areas of partial thickness skin loss related to moiture associated skin damage  Present on Admission: Yes    Consultants:  Orthopedics  Procedures:  None  Antimicrobials:  Anti-infectives (From admission, onward)    Start     Dose/Rate Route Frequency Ordered Stop   10/18/21 0330  vancomycin (VANCOCIN) IVPB 1000  mg/200 mL premix        1,000 mg 200 mL/hr over 60 Minutes Intravenous Every 12 hours 10/17/21 1135     10/16/21 1600  vancomycin (VANCOCIN) 2,500 mg in sodium chloride 0.9 % 500 mL IVPB  Status:  Discontinued        2,500 mg 250 mL/hr over 120 Minutes Intravenous Every 36 hours 10/15/21 0327 10/17/21 1135   10/16/21 1030  metroNIDAZOLE (FLAGYL) IVPB 500 mg  Status:  Discontinued        500 mg 100 mL/hr over 60 Minutes Intravenous Every 12 hours 10/16/21 0936 10/17/21 1314   10/15/21 1000  ceFEPIme (MAXIPIME) 2 g in sodium chloride 0.9 % 100 mL IVPB        2 g 200 mL/hr over 30 Minutes Intravenous Every 8 hours 10/15/21 0327     10/15/21 0130  vancomycin (VANCOCIN) IVPB 1000 mg/200 mL premix  Status:  Discontinued        1,000 mg 200 mL/hr over 60 Minutes Intravenous  Once 10/15/21 0117 10/15/21 0129   10/15/21 0130  ceFEPIme (MAXIPIME) 2 g in sodium chloride 0.9 % 100 mL IVPB        2 g 200 mL/hr over 30 Minutes Intravenous  Once 10/15/21 0117 10/15/21 0918   10/15/21 0130  vancomycin (VANCOREADY) IVPB 2000 mg/400 mL        2,000 mg 200 mL/hr over 120 Minutes Intravenous  Once 10/15/21 0129 10/15/21 0918         Subjective: Seen and examined.  He has no new complaint.  Objective: Vitals:   10/17/21 2059 10/18/21 0519 10/18/21 0521 10/18/21 0810  BP:  137/78 137/78 (!) 149/83  Pulse: 71 70 69 70  Resp: 18 18  18   Temp:  98 F (36.7 C) 98 F (36.7 C) 98.7 F (37.1 C)  TempSrc:  Oral Oral Oral  SpO2: 93% 91% (!) 89% 93%  Weight:      Height:        Intake/Output Summary (Last 24 hours) at 10/18/2021 1035 Last data filed at 10/18/2021 0523 Gross per 24 hour  Intake 760 ml  Output 1700 ml  Net -940 ml    Filed Weights   10/14/21 1821 10/15/21 2159  Weight: (!) 158.8 kg (!) 159.2 kg    Examination:  General exam: Appears calm and comfortable  Respiratory system: Clear to auscultation. Respiratory effort normal. Cardiovascular system: S1 & S2 heard, RRR. No JVD,  murmurs, rubs, gallops or clicks. No pedal edema. Gastrointestinal system: Abdomen is nondistended, soft and nontender. No organomegaly or masses felt. Normal bowel sounds heard. Central nervous system: Alert and oriented. No focal neurological deficits. Extremities: Symmetric 5 x 5 power. Skin: No rashes, lesions or ulcers.  Psychiatry: Judgement and insight appear normal. Mood & affect appropriate.   Data Reviewed: I have personally reviewed following labs and imaging studies  CBC: Recent Labs  Lab 10/14/21 2035 10/15/21 0330 10/16/21 1221 10/17/21 0416 10/18/21 0159  WBC 12.0* 10.3 8.6 8.9 8.1  NEUTROABS 9.6*  --   --   --   --   HGB 9.4* 9.5* 8.1* 8.6* 8.4*  HCT 32.6* 31.5* 26.9* 28.3* 27.9*  MCV 79.1* 78.9* 79.1* 79.1* 78.2*  PLT 305 282 227 235 270    Basic Metabolic Panel: Recent Labs  Lab 10/14/21 2035 10/15/21 0330 10/16/21 0142 10/16/21 1221 10/17/21 0416 10/18/21 0159  NA 133* 134*  --  136 136 135  K 4.0 3.8  --  4.3 4.3 4.5  CL 103 105  --  104 103 103  CO2 21* 23  --  24 24 22   GLUCOSE 151* 215*  --  196* 139* 162*  BUN 71* 64*  --  42* 37* 31*  CREATININE 1.74* 1.68* 1.33* 1.48* 1.30* 1.14  CALCIUM 8.4* 8.3*  --  8.7* 8.6* 8.6*    GFR: Estimated Creatinine Clearance: 96.9 mL/min (by C-G formula based on SCr of 1.14 mg/dL). Liver Function Tests: Recent Labs  Lab 10/14/21 2035  AST 11*  ALT 14  ALKPHOS 51  BILITOT 0.4  PROT 7.1  ALBUMIN 2.5*    No results for input(s): LIPASE, AMYLASE in the last 168 hours. No results for input(s): AMMONIA in the last 168 hours. Coagulation Profile: No results for input(s): INR, PROTIME in the last 168 hours. Cardiac Enzymes: No results for input(s): CKTOTAL, CKMB, CKMBINDEX, TROPONINI in the last 168 hours. BNP (last 3 results) No results for input(s): PROBNP in the last 8760 hours. HbA1C: Recent Labs    10/17/21 0416  HGBA1C 7.3*    CBG: Recent Labs  Lab 10/17/21 0823 10/17/21 1125  10/17/21 1649 10/17/21 2340 10/18/21 0809  GLUCAP 146* 169* 125* 163* 144*    Lipid Profile: No  results for input(s): CHOL, HDL, LDLCALC, TRIG, CHOLHDL, LDLDIRECT in the last 72 hours. Thyroid Function Tests: No results for input(s): TSH, T4TOTAL, FREET4, T3FREE, THYROIDAB in the last 72 hours. Anemia Panel: Recent Labs    10/17/21 0416  VITAMINB12 185  FOLATE 7.0  FERRITIN 237  TIBC 227*  IRON 23*  RETICCTPCT 1.2    Sepsis Labs: Recent Labs  Lab 10/14/21 2035  LATICACIDVEN 1.4     Recent Results (from the past 240 hour(s))  Resp Panel by RT-PCR (Flu A&B, Covid) Nasopharyngeal Swab     Status: None   Collection Time: 10/14/21 11:36 PM   Specimen: Nasopharyngeal Swab; Nasopharyngeal(NP) swabs in vial transport medium  Result Value Ref Range Status   SARS Coronavirus 2 by RT PCR NEGATIVE NEGATIVE Final    Comment: (NOTE) SARS-CoV-2 target nucleic acids are NOT DETECTED.  The SARS-CoV-2 RNA is generally detectable in upper respiratory specimens during the acute phase of infection. The lowest concentration of SARS-CoV-2 viral copies this assay can detect is 138 copies/mL. A negative result does not preclude SARS-Cov-2 infection and should not be used as the sole basis for treatment or other patient management decisions. A negative result may occur with  improper specimen collection/handling, submission of specimen other than nasopharyngeal swab, presence of viral mutation(s) within the areas targeted by this assay, and inadequate number of viral copies(<138 copies/mL). A negative result must be combined with clinical observations, patient history, and epidemiological information. The expected result is Negative.  Fact Sheet for Patients:  EntrepreneurPulse.com.au  Fact Sheet for Healthcare Providers:  IncredibleEmployment.be  This test is no t yet approved or cleared by the [place] FDA and  has been authorized for  detection and/or diagnosis of SARS-CoV-2 by FDA under an Emergency Use Authorization (EUA). This EUA will remain  in effect (meaning this test can be used) for the duration of the COVID-19 declaration under Section 564(b)(1) of the Act, 21 U.S.C.section 360bbb-3(b)(1), unless the authorization is terminated  or revoked sooner.       Influenza A by PCR NEGATIVE NEGATIVE Final   Influenza B by PCR NEGATIVE NEGATIVE Final    Comment: (NOTE) The Xpert Xpress SARS-CoV-2/FLU/RSV plus assay is intended as an aid in the diagnosis of influenza from Nasopharyngeal swab specimens and should not be used as a sole basis for treatment. Nasal washings and aspirates are unacceptable for Xpert Xpress SARS-CoV-2/FLU/RSV testing.  Fact Sheet for Patients: EntrepreneurPulse.com.au  Fact Sheet for Healthcare Providers: IncredibleEmployment.be  This test is not yet approved or cleared by the [place] FDA and has been authorized for detection and/or diagnosis of SARS-CoV-2 by FDA under an Emergency Use Authorization (EUA). This EUA will remain in effect (meaning this test can be used) for the duration of the COVID-19 declaration under Section 564(b)(1) of the Act, 21 U.S.C. section 360bbb-3(b)(1), unless the authorization is terminated or revoked.  Performed at [facility], [place] [address], [place], [place] [address]       Radiology Studies: No results found.  Scheduled Meds:  sodium chloride   Intravenous Once   allopurinol  300 mg Oral Daily   atorvastatin  10 mg Oral Daily   bisacodyl  20 mg Oral Once   [START ON 10/20/2021] docusate sodium  100 mg Oral BID   hydrALAZINE  50 mg Oral BID   insulin aspart  0-15 Units Subcutaneous TID WC   insulin glargine-yfgn  10 Units Subcutaneous Daily   metoCLOPramide (REGLAN) injection  10 mg Intravenous  Q6H   metoprolol succinate  100 mg Oral Daily   pantoprazole  40 mg Oral BID   peg 3350  powder  0.5 kit Oral Once   And   peg 3350 powder  0.5 kit Oral Once   [START ON 10/20/2021] polyethylene glycol  17 g Oral BID   Continuous Infusions:  ceFEPime (MAXIPIME) IV 2 g (10/18/21 0858)   vancomycin 1,000 mg (10/18/21 0311)     LOS: 3 days   Time spent: 27 minutes  Darliss Cheney, MD [facility]  10/18/2021, 10:35 AM  Please page via [facility] and do not message via secure chat for urgent patient care matters. Secure chat can be used for non urgent patient care matters.  How to contact the [facility] Attending or Consulting provider [place] or covering provider during after hours [place], for this patient?  Check the care team in [facility] and look for a) attending/consulting TR[facility] provider listed and b) the [facility] team listed. Page or secure chat [phone]. Log into [email or web address] and use [place]'s universal password to access. If you do not have the password, please contact the hospital operator. Locate the [facility] provider you are looking for under [facility] and page to a number that you can be directly reached. If you still have difficulty reaching the provider, please page the [facility] (Director on Call) for the Hospitalists listed on amion for assistance.

## 2021-10-18 NOTE — Consult Note (Signed)
ORTHOPAEDIC CONSULTATION  REQUESTING PHYSICIAN: Darliss Cheney, MD  Chief Complaint: Chronic painful draining abscess left ankle.  HPI: [name] [name] is a 66 y.o. male who presents with chronic venous insufficiency with a draining abscess left ankle.  Patient states that he has been going to the wound center at [facility] long that around Thanksgiving the anterior ulcer over the left ankle was probed there was purulent drainage.  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[facility])    Essential hypertension    History of nephrolithiasis    History of open leg wound    History of stroke July 2004   Obstructive sleep apnea    TIA (transient ischemic attack)    Varicose veins    Laser ablation of left great saphenous vein 2009   Past Surgical History:  Procedure Laterality Date   CYSTOSCOPY WITH RETROGRADE PYELOGRAM, URETEROSCOPY AND STENT PLACEMENT Right 08/02/2015   Procedure: [place], URETEROSCOPY , STONE EXTRACTION AND STENT PLACEMENT;  Surgeon: Cleon Gustin, MD;  Location: [facility] ORS;  Service: Urology;  Laterality: Right;   HOLMIUM LASER APPLICATION Right 35/46/5681   Procedure: HOLMIUM LASER APPLICATION;  Surgeon: Cleon Gustin, MD;  Location: [facility] ORS;  Service: Urology;  Laterality: Right;   KIDNEY STONE SURGERY     VASECTOMY  1984   VEIN SURGERY Left 06/2010   Laser ablation   Social History   Socioeconomic History   Marital status: Married    Spouse name: Not on file   Number of children: 4   Years of education: 14   Highest education level: Not on file  Occupational History   Occupation: controll Financial planner     Employer: [employer]  Tobacco Use   Smoking status: Never   Smokeless tobacco: Never  Substance and Sexual Activity   Alcohol use: No    Alcohol/week: 0.0 standard drinks   Drug use: No   Sexual activity: Not on file  Other Topics Concern   Not on file  Social History Narrative   Not on file   Social  Determinants of Health   Financial Resource Strain: Not on file  Food Insecurity: Not on file  Transportation Needs: Not on file  Physical Activity: Not on file  Stress: Not on file  Social Connections: Not on file   Family History  Problem Relation Age of Onset   Bone cancer Mother    Hypertension Father    Pancreatic cancer Maternal Grandmother    Stroke Paternal Grandmother    Diabetes Paternal Grandfather    Heart attack Brother    Hypertension Brother    Heart attack Sister    - negative except otherwise stated in the family history section No Known Allergies Prior to Admission medications   Medication Sig Start Date End Date Taking? Authorizing Provider  acetaminophen (TYLENOL) 500 MG tablet Take 1,000 mg by mouth daily as needed for moderate pain.   Yes [provider]  allopurinol (ZYLOPRIM) 300 MG tablet Take 300 mg by mouth daily.   Yes [provider]  aspirin 325 MG tablet Take 325 mg by mouth daily.   Yes [provider]  atorvastatin (LIPITOR) 10 MG tablet Take 10 mg by mouth daily.   Yes [provider]  clopidogrel (PLAVIX) 75 MG tablet Take 75 mg by mouth daily.   Yes [provider]  diclofenac (VOLTAREN) 75 MG EC tablet Take 75 mg by mouth 2 (two) times daily. 09/29/21  Yes  [provider]  furosemide (LASIX) 40 MG tablet Take 80 mg by mouth daily as needed for fluid or edema.   Yes [provider]  hydrALAZINE (APRESOLINE) 50 MG tablet TAKE 1 TABLET BY MOUTH TWICE A DAY. NEED OFFICE VISIT FOR FURTHER REFILLS Patient taking differently: Take 50-100 mg by mouth 2 (two) times daily. Increased by Dr. Quintin Alto 03/08/18  Yes Satira Sark, MD  metoprolol succinate (TOPROL-XL) 100 MG 24 hr tablet Take 100 mg by mouth daily. Take with or immediately following a meal.   Yes [provider]  olmesartan-hydrochlorothiazide (BENICAR HCT) 40-25 MG tablet Take 1 tablet by mouth daily. 04/22/18  Yes  [provider]  aspirin EC 81 MG tablet Take 1 tablet (81 mg total) by mouth daily. Patient not taking: Reported on 04/09/2021 05/10/18   Satira Sark, MD   No results found. - pertinent xrays, CT, MRI studies were reviewed and independently interpreted  Positive ROS: All other systems have been reviewed and were otherwise negative with the exception of those mentioned in the HPI and as above.  Physical Exam: General: Alert, no acute distress Psychiatric: Patient is competent for consent with normal mood and affect Lymphatic: No axillary or cervical lymphadenopathy Cardiovascular: No pedal edema Respiratory: No cyanosis, no use of accessory musculature GI: No organomegaly, abdomen is soft and non-tender    Images:  @ENCIMAGES @  Labs:  Lab Results  Component Value Date   HGBA1C 7.3 (H) 10/17/2021   HGBA1C 6.5 (H) 09/12/2019   CRP 17.6 (H) 09/14/2019   CRP 14.7 (H) 09/13/2019   CRP 14.6 (H) 09/12/2019   REPTSTATUS 09/14/2019 FINAL 09/09/2019   CULT  09/09/2019    NO GROWTH 5 DAYS Performed at [facility], [address] [place], [place] [address]     Lab Results  Component Value Date   ALBUMIN 2.5 (L) 10/14/2021   ALBUMIN 3.8 04/09/2021   ALBUMIN 2.9 (L) 09/14/2019     CBC EXTENDED Latest Ref Rng & Units 10/18/2021 10/17/2021 10/17/2021  WBC 4.0 - 10.5 K/uL 8.1 8.9 -  RBC 4.22 - 5.81 MIL/uL 3.57(L) 3.58(L) 3.67(L)  HGB 13.0 - 17.0 g/dL 8.4(L) 8.6(L) -  HCT 39.0 - 52.0 % 27.9(L) 28.3(L) -  PLT 150 - 400 K/uL 225 235 -  NEUTROABS 1.7 - 7.7 K/uL - - -  LYMPHSABS 0.7 - 4.0 K/uL - - -    Neurologic: Patient does not have protective sensation bilateral lower extremities.   MUSCULOSKELETAL:   Skin: Examination patient has a necrotic ulcer over the anterior left ankle.  There is brawny edema in both legs with chronic venous insufficiency and swelling in the forefoot pronounced bilaterally.  Patient has a strong palpable dorsalis pedis pulse  bilaterally.  Review of the x-rays show arthritic changes of the ankle.  Review of the MRI scan shows advanced chronic osteomyelitis of the tibia talus and hindfoot.  Patient's albumin is 2.5.  Hemoglobin 8.4.  Hemoglobin A1c 7.3 with a C-reactive protein of 17.6.  Patient states that he has not been able to walk for months due to the pain.  Patient does have active extension of the left knee.  Assessment: Assessment: Chronic osteomyelitis left ankle and hindfoot with a chronic draining cloaca wound.  Plan: Discussed treatment options.  Recommended proceeding with a transtibial amputation.  Risk and benefits were discussed including risk of the wound not healing need for additional surgery.  Patient states he understands wished to proceed with surgery.  We will plan  for surgery tomorrow Wednesday.  Patient most likely will need discharge to skilled nursing.  Thank you for the consult and the opportunity to see Mr. Jawad Wiacek, [place] [phone] 7:09 AM

## 2021-10-18 NOTE — Progress Notes (Signed)
Physical Therapy Treatment Patient Details Name: [name] [name] MRN: [id number] DOB: [birth year] Today's Date: 10/18/2021   History of Present Illness 66 y/o M presenting to [place] n 1/27 with L Leg pain x1 month, reports turning his L ankle when getting into his car in December, has not been able to bear weight thorugh LLE since. PMH includes morbid obesity, CHF, HTN, and CVA    PT Comments    Pt received in supine and agreeable to session. Pt able to roll to come to sitting at EOB with mod assist +2 for LE management and scooting to edge. Once at EOB pt able to maintain sitting balance with alternating UE support of bed rail with up to min guard. LE A/AROM initiated with focus on LLE. Discussed and educated pt on mobility, handling, and positioning of LLE post BKA tomorrow. Pt demonstrated understanding with teach back and demonstration of exercises. Pt continues to benefit from skilled PT services to progress toward functional mobility goals.    Recommendations for follow up therapy are one component of a multi-disciplinary discharge planning process, led by the attending physician.  Recommendations may be updated based on patient status, additional functional criteria and insurance authorization.  Follow Up Recommendations  Skilled nursing-short term rehab (<3 hours/day)     Assistance Recommended at Discharge Frequent or constant Supervision/Assistance  Patient can return home with the following Two people to help with bathing/dressing/bathroom;Two people to help with walking and/or transfers;Help with stairs or ramp for entrance   Equipment Recommendations  Other (comment) (hoyer lift)    Recommendations for Other Services       Precautions / Restrictions Precautions Precautions: Fall Restrictions Weight Bearing Restrictions: No     Mobility  Bed Mobility Overal bed mobility: Needs Assistance Bed Mobility: Sit to Supine, Supine to Sit     Supine to sit: Mod assist, +2 for  physical assistance Sit to supine: Mod assist, +2 for physical assistance   General bed mobility comments: Assist for LLE's off edge of bed, use of bed pad to scoot hips forward. Difficulty bending knees, +2 assist for LE back into bed. Verbal cues needed for use of bed rails to help scoot up in bed +2 with use of bed pad to help scoot up in bed.    Transfers                   General transfer comment: deferred    Ambulation/Gait                   Stairs             Wheelchair Mobility    Modified Rankin (Stroke Patients Only)       Balance Overall balance assessment: Needs assistance Sitting-balance support: Feet supported, Bilateral upper extremity supported Sitting balance-Leahy Scale: Fair Sitting balance - Comments: sits EOB with ability to reach within BOS, alternating UE support on bed rails Postural control: Posterior lean          Cognition Arousal/Alertness: Awake/alert Behavior During Therapy: WFL for tasks assessed/performed Overall Cognitive Status: Within Functional Limits for tasks assessed          Exercises General Exercises - Lower Extremity Long Arc Quad: AROM, AAROM, Left, 10 reps, Seated Hip Flexion/Marching: AROM, Left, 10 reps, Seated    General Comments General comments (skin integrity, edema, etc.): VSS on RA      Pertinent Vitals/Pain Pain Assessment Pain Assessment: Faces Faces Pain Scale: Hurts even more Pain Location:  LLE with movement Pain Descriptors / Indicators: Grimacing, Guarding Pain Intervention(s): Limited activity within patient's tolerance, Monitored during session, Repositioned    Home Living Family/patient expects to be discharged to:: Private residence Living Arrangements: Alone Available Help at Discharge: Neighbor;Friend(s);Family;Other (Comment);Personal care attendant Type of Home: House Home Access: Ramped entrance       Home Layout: One level Home Equipment: BSC/3in1;Wheelchair -  Engineer, manufacturing systems (2 wheels) Additional Comments: sleeps in lift chair    Prior Function            PT Goals (current goals can now be found in the care plan section) Acute Rehab PT Goals Patient Stated Goal: less pain, be able to stand PT Goal Formulation: With patient Time For Goal Achievement: 10/30/21    Frequency    Min 3X/week      PT Plan      Co-evaluation PT/OT/SLP Co-Evaluation/Treatment: Yes Reason for Co-Treatment: For patient/therapist safety;To address functional/ADL transfers PT goals addressed during session: Mobility/safety with mobility        AM-PAC PT "6 Clicks" Mobility   Outcome Measure  Help needed turning from your back to your side while in a flat bed without using bedrails?: A Lot Help needed moving from lying on your back to sitting on the side of a flat bed without using bedrails?: A Lot Help needed moving to and from a bed to a chair (including a wheelchair)?: Total Help needed standing up from a chair using your arms (e.g., wheelchair or bedside chair)?: Total Help needed to walk in hospital room?: Total Help needed climbing 3-5 steps with a railing? : Total 6 Click Score: 8    End of Session   Activity Tolerance: Patient limited by pain;Patient limited by fatigue Patient left: in bed;with call bell/phone within reach Nurse Communication: Mobility status PT Visit Diagnosis: Pain;Other abnormalities of gait and mobility (R26.89);Muscle weakness (generalized) (M62.81);Difficulty in walking, not elsewhere classified (R26.2) Pain - Right/Left: Left Pain - part of body: Ankle and joints of foot     Time: 1405-1430 PT Time Calculation (min) (ACUTE ONLY): 25 min  Charges:  $Therapeutic Activity: 8-22 mins                    Nathali Vent R. PTA Acute Rehabilitation Office: [place] 10/18/2021, 2:43 PM

## 2021-10-18 NOTE — Care Management Important Message (Signed)
Important Message  Patient Details  Name: [name] [name] MRN: [id number] Date of Birth: November 25, 1955   Medicare Important Message Given:  Yes     Hannah Beat 10/18/2021, 11:17 AM

## 2021-10-18 NOTE — H&P (View-Only) (Signed)
° °   Progress Note   Subjective  Chief Complaint: Anemia  He has not had a BM since 1/27. Denies ab pain.    Objective   Vital signs in last 24 hours: Temp:  [98 F (36.7 C)-98.7 F (37.1 C)] 98.7 F (37.1 C) (01/31 0810) Pulse Rate:  [69-72] 70 (01/31 0810) Resp:  [18] 18 (01/31 0810) BP: (137-149)/(78-83) 149/83 (01/31 0810) SpO2:  [89 %-93 %] 93 % (01/31 0810) Last BM Date: 10/13/21  General: morbidly obese male in no acute distress  Heart:  regular rate and rhythm Pulm: Clear anteriorly; no wheezing Abdomen:  Soft, Obese AB, Normal bowel sounds.  no  tenderness . Without guarding and Without rebound, Extremities:  Legs wrapped, severe lymphedema noted. Neurologic:  Alert and  oriented x4;  grossly normal neurologically.  Psychiatric: Cooperative. Normal mood and affect.  Intake/Output from previous day: 01/30 0701 - 01/31 0700 In: 1120 [P.O.:720; IV Piggyback:400] Out: 1700 [Urine:1700] Intake/Output this shift: No intake/output data recorded.  Lab Results: Recent Labs    10/16/21 1221 10/17/21 0416 10/18/21 0159  WBC 8.6 8.9 8.1  HGB 8.1* 8.6* 8.4*  HCT 26.9* 28.3* 27.9*  PLT 227 235 225   BMET Recent Labs    10/16/21 1221 10/17/21 0416 10/18/21 0159  NA 136 136 135  K 4.3 4.3 4.5  CL 104 103 103  CO2 24 24 22   GLUCOSE 196* 139* 162*  BUN 42* 37* 31*  CREATININE 1.48* 1.30* 1.14  CALCIUM 8.7* 8.6* 8.6*   LFT No results for input(s): PROT, ALBUMIN, AST, ALT, ALKPHOS, BILITOT, BILIDIR, IBILI in the last 72 hours.  PT/INR No results for input(s): LABPROT, INR in the last 72 hours.  Studies/Results: No results found.    Impression/Plan:   66 year old male w/hx of CVA on asp/plavix, HFpE, OSA not on CPAP, DM and morbid obesity p/w microcytic anemia and intermittent rectal bleeding. Extensive NSAID use, no prior colon cancer screening.   IDA in setting of aspirin and Plavix, NSAIDS. Found to have Hb drop from 13.3 to 9.4 upon arrival. Blood  counts have dipped slightly since arrival but have stabilized since then. Low iron saturation. Ferritin 237, low serum iron.  - Plan for colon/EGD tomorrow after plavix wash out. Discussed the procedures extensively with the patient, he would like to proceed with figuring what is going on with the low blood counts. I suspect that the patient will have difficulty with his prep since he has underlying constipation issues at a baseline. If he is not able to prep by tomorrow, then can plan to do EGD alone to start with for evaluation.  - continue supportive care - Trend Hb. 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 Sharol Given. Patient states that he may be going for amputation tomorrow  History of CVA Aspirin and Plavix on hold- last dose 10/14/21  Constipation Appears chronic, miralax twice daily, may need longer prep/dulcolax prior to colon  Comorbid conditions include: Chronic diastolic heart failure Diabetes Morbid obesity OSA not on CPAP  Future Appointments  Date Time Provider [place]  10/18/2021  2:30 PM Valinda Party, DO [facility] [place]      LOS: 3 days   Sharyn Creamer  10/18/2021, 9:57 AM

## 2021-10-19 ENCOUNTER — Encounter (HOSPITAL_COMMUNITY)
Admission: EM | Disposition: A | Discharge status: Skilled Nursing Facility | Payer: Self-pay | Source: Home / Self Care | Attending: Family Medicine

## 2021-10-19 ENCOUNTER — Inpatient Hospital Stay (HOSPITAL_COMMUNITY): Payer: Medicare Other | Admitting: Anesthesiology

## 2021-10-19 ENCOUNTER — Encounter (HOSPITAL_COMMUNITY): Payer: Self-pay | Admitting: Internal Medicine

## 2021-10-19 DIAGNOSIS — K2970 Gastritis, unspecified, without bleeding: Secondary | ICD-10-CM

## 2021-10-19 DIAGNOSIS — K635 Polyp of colon: Secondary | ICD-10-CM

## 2021-10-19 DIAGNOSIS — K625 Hemorrhage of anus and rectum: Secondary | ICD-10-CM

## 2021-10-19 HISTORY — PX: ESOPHAGOGASTRODUODENOSCOPY (EGD) WITH PROPOFOL: SHX5813

## 2021-10-19 HISTORY — PX: POLYPECTOMY: SHX5525

## 2021-10-19 HISTORY — PX: BIOPSY: SHX5522

## 2021-10-19 HISTORY — PX: COLONOSCOPY: SHX5424

## 2021-10-19 LAB — BASIC METABOLIC PANEL
Anion gap: 9 (ref 5–15)
BUN: 23 mg/dL (ref 8–23)
CO2: 24 mmol/L (ref 22–32)
Calcium: 8.8 mg/dL — ABNORMAL LOW (ref 8.9–10.3)
Chloride: 104 mmol/L (ref 98–111)
Creatinine, Ser: 1.17 mg/dL (ref 0.61–1.24)
GFR, Estimated: >60 mL/min (ref >60)
Glucose, Bld: 143 mg/dL — ABNORMAL HIGH (ref 70–99)
Potassium: 4.4 mmol/L (ref 3.5–5.1)
Sodium: 137 mmol/L (ref 135–145)

## 2021-10-19 LAB — CBC
HCT: 29.3 % — ABNORMAL LOW (ref 39.0–52.0)
Hemoglobin: 9 g/dL — ABNORMAL LOW (ref 13.0–17.0)
MCH: 24.2 pg — ABNORMAL LOW (ref 26.0–34.0)
MCHC: 30.7 g/dL (ref 30.0–36.0)
MCV: 78.8 fL — ABNORMAL LOW (ref 80.0–100.0)
Platelets: 217 10*3/uL (ref 150–400)
RBC: 3.72 MIL/uL — ABNORMAL LOW (ref 4.22–5.81)
RDW: 18.8 % — ABNORMAL HIGH (ref 11.5–15.5)
WBC: 7.5 10*3/uL (ref 4.0–10.5)
nRBC: 0 % (ref 0.0–0.2)

## 2021-10-19 LAB — GLUCOSE, CAPILLARY: Glucose-Capillary: 154 mg/dL — ABNORMAL HIGH (ref 70–99)

## 2021-10-19 SURGERY — ESOPHAGOGASTRODUODENOSCOPY (EGD) WITH PROPOFOL
Anesthesia: Monitor Anesthesia Care

## 2021-10-19 MED ORDER — LIDOCAINE 2% (20 MG/ML) 5 ML SYRINGE
INTRAMUSCULAR | Status: DC | PRN
  Administered 2021-10-19: 100 mg via INTRAVENOUS

## 2021-10-19 MED ORDER — PROPOFOL 500 MG/50ML IV EMUL
INTRAVENOUS | Status: DC | PRN
  Administered 2021-10-19: 150 ug/kg/min via INTRAVENOUS

## 2021-10-19 MED ORDER — PROPOFOL 10 MG/ML IV BOLUS
INTRAVENOUS | Status: DC | PRN
  Administered 2021-10-19: 30 mg via INTRAVENOUS

## 2021-10-19 SURGICAL SUPPLY — 15 items

## 2021-10-19 NOTE — Op Note (Signed)
[facility] Patient Name: [name] [name] Procedure Date : 10/19/2021 MRN: [id number] Attending MD: Georgian Co ,  Date of Birth: 07-25-1956 CSN: [id number] Age: 66 Admit Type: Inpatient Procedure:                Colonoscopy Indications:              Rectal bleeding, Iron deficiency anemia Providers:                Adline Mango" Lady Deutscher, RN, Princess Bruins, RN, Luan Moore, Technician Referring MD:             Hospitalist team Medicines:                Monitored Anesthesia Care Complications:            No immediate complications. Estimated Blood Loss:     Estimated blood loss was minimal. Procedure:                Pre-Anesthesia Assessment:                           - Prior to the procedure, a History and Physical                            was performed, and patient medications and                            allergies were reviewed. The patient's tolerance of                            previous anesthesia was also reviewed. The risks                            and benefits of the procedure and the sedation                            options and risks were discussed with the patient.                            All questions were answered, and informed consent                            was obtained. Prior Anticoagulants: The patient has                            taken no previous anticoagulant or antiplatelet                            agents. ASA Grade Assessment: III - A patient with                            severe systemic disease. After reviewing the risks  and benefits, the patient was deemed in                            satisfactory condition to undergo the procedure.                           After obtaining informed consent, the colonoscope                            was passed under direct vision. Throughout the                            procedure, the patient's blood pressure,  pulse, and                            oxygen saturations were monitored continuously. The                            CF-HQ190L ([id number]) Olympus colonoscope was                            introduced through the anus and advanced to the the                            terminal ileum. The patient tolerated the procedure                            well. The colonoscopy was technically difficult and                            complex due to a redundant colon. Successful                            completion of the procedure was aided by changing                            the patient's position. The quality of the bowel                            preparation was adequate. The terminal ileum,                            ileocecal valve, appendiceal orifice, and rectum                            were photographed. Scope In: 11:43:41 AM Scope Out: 12:41:28 PM Scope Withdrawal Time: 0 hours 31 minutes 27 seconds  Total Procedure Duration: 0 hours 57 minutes 47 seconds  Findings:      The terminal ileum appeared normal.      A single (solitary) twenty mm ulcer was found at the ileocecal valve. No       bleeding was present. No stigmata of recent bleeding were seen. Biopsies       were taken with a cold forceps for histology.  Seven sessile polyps were found in the sigmoid colon and descending       colon. The polyps were 3 to 7 mm in size. These polyps were removed with       a cold snare. Resection and retrieval were complete.      A 6 mm polyp was found in the sigmoid colon. The polyp was pedunculated.       The polyp was removed with a hot snare. Resection and retrieval were       complete.      A few small-mouthed diverticula were found in the sigmoid colon.      Non-bleeding internal hemorrhoids were found during retroflexion. Impression:               - The examined portion of the ileum was normal.                           - A single (solitary) ulcer at the ileocecal valve.                             Biopsied.                           - Seven 3 to 7 mm polyps in the sigmoid colon and                            in the descending colon, removed with a cold snare.                            Resected and retrieved.                           - One 6 mm polyp in the sigmoid colon, removed with                            a hot snare. Resected and retrieved.                           - Diverticulosis in the sigmoid colon.                           - Non-bleeding internal hemorrhoids. Recommendation:           - Return patient to hospital ward for ongoing care.                           - Patient may have had blood loss from gastric                            erosions or from ileocecal valve ulcer.                           - Await pathology results.                           - The findings and recommendations were discussed  with the patient. Procedure Code(s):        --- Professional ---                           [phone], Colonoscopy, flexible; with removal of                            tumor(s), polyp(s), or other lesion(s) by snare                            technique Diagnosis Code(s):        --- Professional ---                           K64.8, Other hemorrhoids                           K63.5, Polyp of colon                           K62.5, Hemorrhage of anus and rectum                           D50.9, Iron deficiency anemia, unspecified                           K57.30, Diverticulosis of large intestine without                            perforation or abscess without bleeding CPT copyright 2019 American Medical Association. All rights reserved. The codes documented in this report are preliminary and upon coder review may  be revised to meet current compliance requirements. Sonny Masters "Christia Reading,  10/19/2021 1:22:49 PM Number of Addenda: 0

## 2021-10-19 NOTE — Anesthesia Preprocedure Evaluation (Addendum)
Anesthesia Evaluation  Patient identified by MRN, date of birth, ID band Patient awake    Reviewed: Allergy & Precautions, NPO status , Patient's Chart, lab work & pertinent test results, reviewed documented beta blocker date and time   Airway Mallampati: III  TM Distance: >3 FB Neck ROM: Full    Dental  (+) Poor Dentition, Missing, Chipped, Dental Advisory Given Extremely poor dentition throughout:   Pulmonary sleep apnea (doesnt use CPAP) ,    Pulmonary exam normal breath sounds clear to auscultation       Cardiovascular hypertension (poorly controlled- 194/87 in preop), Pt. on medications and Pt. on home beta blockers +CHF (diastolic, normal LVEF in 7543)  Normal cardiovascular exam+ Valvular Problems/Murmurs (mild AI) AI  Rhythm:Regular Rate:Normal  Last echo: - Left ventricle: The cavity size was normal. Wall thickness was  increased in a pattern of mild LVH. Systolic function was normal.  The estimated ejection fraction was in the range of 60% to 65%.  Diastolic function is abnormal, indeterminant grade. Wall motion  was normal; there were no regional wall motion abnormalities.  - Aortic valve: Mildly calcified annulus. Trileaflet; mildly  thickened leaflets. There was mild regurgitation. Valve area  (VTI): 4.71 cm^2. Valve area (Vmax): 3.73 cm^2. Valve area  (Vmean): 4.11 cm^2.  - Left atrium: The atrium was moderately dilated.  - Technically adequate study.    Neuro/Psych TIACVA (2004, plavix- residual R sided weakness), Residual Symptoms negative psych ROS   GI/Hepatic Neg liver ROS, Anemia 2/2 GIB, rectal bleeding    Endo/Other  diabetes, Poorly Controlled, Type 2Morbid obesityBMI 50  Renal/GU Renal InsufficiencyRenal diseaseCr 1.17  negative genitourinary   Musculoskeletal  (+) Arthritis , Osteoarthritis,  Necrotic LE   Abdominal (+) + obese,   Peds  Hematology  (+) Blood dyscrasia, anemia ,  Hb 9, plt 217   Anesthesia Other Findings   Reproductive/Obstetrics negative OB ROS                            Anesthesia Physical Anesthesia Plan  ASA: 4  Anesthesia Plan: MAC   Post-op Pain Management:    Induction:   PONV Risk Score and Plan: 2 and Propofol infusion and TIVA  Airway Management Planned: Natural Airway and Simple Face Mask  Additional Equipment: None  Intra-op Plan:   Post-operative Plan:   Informed Consent: I have reviewed the patients History and Physical, chart, labs and discussed the procedure including the risks, benefits and alternatives for the proposed anesthesia with the patient or authorized representative who has indicated his/her understanding and acceptance.     Dental advisory given  Plan Discussed with: CRNA  Anesthesia Plan Comments:        Anesthesia Quick Evaluation

## 2021-10-19 NOTE — Op Note (Signed)
[facility] Patient Name: [name] [name] Procedure Date : 10/19/2021 MRN: [id number] Attending MD: Georgian Co ,  Date of Birth: 05-Dec-1955 CSN: [id number] Age: 66 Admit Type: Inpatient Procedure:                Upper GI endoscopy Indications:              Iron deficiency anemia Providers:                Adline Mango" Lady Deutscher, RN, Princess Bruins, RN, Luan Moore, Technician Referring MD:             Hospitalist team Medicines:                Monitored Anesthesia Care Complications:            No immediate complications. Estimated Blood Loss:     Estimated blood loss was minimal. Procedure:                Pre-Anesthesia Assessment:                           - Prior to the procedure, a History and Physical                            was performed, and patient medications and                            allergies were reviewed. The patient's tolerance of                            previous anesthesia was also reviewed. The risks                            and benefits of the procedure and the sedation                            options and risks were discussed with the patient.                            All questions were answered, and informed consent                            was obtained. Prior Anticoagulants: The patient has                            taken no previous anticoagulant or antiplatelet                            agents. ASA Grade Assessment: III - A patient with                            severe systemic disease. After reviewing the risks  and benefits, the patient was deemed in                            satisfactory condition to undergo the procedure.                           After obtaining informed consent, the endoscope was                            passed under direct vision. Throughout the                            procedure, the patient's blood pressure, pulse, and                             oxygen saturations were monitored continuously. The                            [id number]-[id number] ([id number]) Olympus endoscope was introduced                            through the mouth, and advanced to the second part                            of duodenum. The upper GI endoscopy was                            accomplished without difficulty. The patient                            tolerated the procedure well. Scope In: Scope Out: Findings:      White nummular lesions were noted in the middle third of the esophagus       and in the lower third of the esophagus. Biopsies were taken with a cold       forceps for histology.      Localized severe inflammation characterized by congestion (edema),       erosions and erythema was found in the gastric antrum. Biopsies were       taken with a cold forceps for histology.      The examined duodenum was normal. Impression:               - White nummular lesions in esophageal mucosa.                            Biopsied.                           - Gastritis. Biopsied.                           - Normal examined duodenum. Recommendation:           - Use a proton pump inhibitor PO BID for 8 weeks.                           -  Await pathology results.                           - Perform a colonoscopy today. Procedure Code(s):        --- Professional ---                           [phone], Esophagogastroduodenoscopy, flexible,                            transoral; with biopsy, single or multiple Diagnosis Code(s):        --- Professional ---                           K22.8, Other specified diseases of esophagus                           K29.70, Gastritis, unspecified, without bleeding                           D50.9, Iron deficiency anemia, unspecified CPT copyright 2019 American Medical Association. All rights reserved. The codes documented in this report are preliminary and upon coder review may  be revised to meet current compliance  requirements. Sonny Masters "Christia Reading,  10/19/2021 1:14:21 PM Number of Addenda: 0

## 2021-10-19 NOTE — Anesthesia Postprocedure Evaluation (Signed)
Anesthesia Post Note  Patient: [name] [name]  Procedure(s) Performed: ESOPHAGOGASTRODUODENOSCOPY (EGD) WITH PROPOFOL COLONOSCOPY BIOPSY POLYPECTOMY     Patient location during evaluation: PACU Anesthesia Type: MAC Level of consciousness: awake and alert Pain management: pain level controlled Vital Signs Assessment: post-procedure vital signs reviewed and stable Respiratory status: spontaneous breathing, nonlabored ventilation and respiratory function stable Cardiovascular status: blood pressure returned to baseline and stable Postop Assessment: no apparent nausea or vomiting Anesthetic complications: no   No notable events documented.  Last Vitals:  Vitals:   10/19/21 1300 10/19/21 1311  BP: (!) 160/83 (!) 169/82  Pulse: 75 71  Resp: (!) 21 20  Temp:    SpO2: 97% 98%    Last Pain:  Vitals:   10/19/21 1311  TempSrc:   PainSc: 0-No pain                 Pervis Hocking

## 2021-10-19 NOTE — Progress Notes (Signed)
OT Cancellation Note  Patient Details Name: [name] [name] MRN: [id number] DOB: [birth year]   Cancelled Treatment:    Reason Eval/Treat Not Completed: Patient at procedure or test/ unavailable (Pt in surgery for L BKA.)  [name] 10/19/2021, 10:33 AM Nestor Lewandowsky, OTR/L Acute Rehabilitation Services Pager: [phone] Office: [phone]

## 2021-10-19 NOTE — Interval H&P Note (Signed)
History and Physical Interval Note:  10/19/2021 7:04 AM  [name] [name]  has presented today for surgery, with the diagnosis of Osteomyelitis Left Foot.  The various methods of treatment have been discussed with the patient and family. After consideration of risks, benefits and other options for treatment, the patient has consented to  Procedure(s): LEFT BELOW KNEE AMPUTATION (Left) as a surgical intervention.  The patient's history has been reviewed, patient examined, no change in status, stable for surgery.  I have reviewed the patient's chart and labs.  Questions were answered to the patient's satisfaction.     [name]

## 2021-10-19 NOTE — NC FL2 (Signed)
[place] LEVEL OF CARE SCREENING TOOL     IDENTIFICATION  Patient Name: [name] [name] Birthdate: 1956-03-19 Sex: male Admission Date (Current Location): 10/14/2021  [facility] and [place] Number:  Herbalist and Address:  [facility], [place] [address], [place], [place] [address]      Provider Number: [id number]  Attending Physician Name and Address:  Darliss Cheney, MD  Relative Name and Phone Number:       Current Level of Care: Hospital Recommended Level of Care: [place] Prior Approval Number:    Date Approved/Denied:   PASRR Number: [id number]  Discharge Plan: SNF    Current Diagnoses: Patient Active Problem List   Diagnosis Date Noted   Left leg pain    Osteomyelitis of ankle and foot ([place]) 10/15/2021   Anemia 10/15/2021   Pressure ulcer 10/15/2021   History of CVA (cerebrovascular accident) 10/15/2021   Septic arthritis of ankle and foot region ([place]) 10/15/2021   AKI (acute kidney injury) ([place]) 10/14/2021   Pneumonia due to COVID-19 virus 09/09/2019   Hypokalemia 09/09/2019   Acute respiratory failure with hypoxia ([place]) 09/09/2019   Essential hypertension 10/14/2014   Chronic diastolic heart failure ([place]) 10/14/2014   Morbid obesity ([place]) 07/22/2013   Varicose veins of lower extremities with other complications 03/17/1600   OSA (obstructive sleep apnea) 07/22/2013   Venous stasis ulcers ([place]) 07/22/2013   Diabetes mellitus ([place]) 01/03/2011    Orientation RESPIRATION BLADDER Height & Weight     Self, Time, Situation, Place  Normal External catheter Weight: (!) 351 lb (159.2 kg) Height:  5\' 10"  (177.8 cm)  BEHAVIORAL SYMPTOMS/MOOD NEUROLOGICAL BOWEL NUTRITION STATUS      Incontinent Diet (See d/c summary)  AMBULATORY STATUS COMMUNICATION OF NEEDS Skin   Extensive Assist Verbally PU Stage and Appropriate Care, Other (Comment) (Pressure injury, buttocks, right, stage 2; pretibial left and right  cellulitis)                       Personal Care Assistance Level of Assistance  Bathing, Feeding, Dressing Bathing Assistance: Limited assistance Feeding assistance: Independent Dressing Assistance: Limited assistance     Functional Limitations Info  Sight, Hearing, Speech Sight Info: Impaired Hearing Info: Adequate Speech Info: Adequate    SPECIAL CARE FACTORS FREQUENCY  PT (By licensed PT), OT (By licensed OT)     PT Frequency: 5x/week OT Frequency: 5x/week            Contractures Contractures Info: Not present    Additional Factors Info  Code Status, Allergies Code Status Info: Full code Allergies Info: NKA           Current Medications (10/19/2021):  This is the current hospital active medication list Current Facility-Administered Medications  Medication Dose Route Frequency Provider Last Rate Last Admin   [MAR Hold] 0.9 %  sodium chloride infusion (Manually program via Guardrails IV Fluids)   Intravenous Once [name], MD       [facility] Hold] acetaminophen (TYLENOL) tablet 650 mg  650 mg Oral Q6H PRN Lavina Hamman, MD       Or   Doug Sou Hold] acetaminophen (TYLENOL) suppository 650 mg  650 mg Rectal Q6H PRN Lavina Hamman, MD       [MAR Hold] allopurinol (ZYLOPRIM) tablet 300 mg  300 mg Oral Daily Lavina Hamman, MD   300 mg at 10/18/21 0853   [MAR Hold] atorvastatin (LIPITOR) tablet 10 mg  10 mg Oral Daily Lavina Hamman, MD   10 mg at 10/18/21 0854   [MAR Hold] ceFEPIme (MAXIPIME) 2 g in sodium chloride 0.9 % 100 mL IVPB  2 g Intravenous Q8H Angela Adam, RPH 200 mL/hr at 10/19/21 0258 2 g at 10/19/21 0258   chlorhexidine (HIBICLENS) 4 % liquid 4 application  60 mL Topical Once [name], MD       [facility] Hold] Chlorhexidine Gluconate Cloth 2 % PADS 6 each  6 each Topical Q0600 Darliss Cheney, MD   6 each at 10/19/21 0612   [facility] Hold] docusate sodium (COLACE) capsule 100 mg  100 mg Oral BID Vena Rua, PA-C       [facility] Hold] hydrALAZINE  (APRESOLINE) tablet 50 mg  50 mg Oral BID Lavina Hamman, MD   50 mg at 10/18/21 1948   [MAR Hold] HYDROcodone-acetaminophen (NORCO/VICODIN) 5-325 MG per tablet 1 tablet  1 tablet Oral Q6H PRN Lavina Hamman, MD   1 tablet at 10/18/21 1949   [MAR Hold] HYDROmorphone (DILAUDID) injection 0.5 mg  0.5 mg Intravenous Q4H PRN Lavina Hamman, MD   0.5 mg at 10/19/21 7654   [facility] Hold] insulin aspart (novoLOG) injection 0-15 Units  0-15 Units Subcutaneous TID WC Gwynne Edinger, MD   2 Units at 10/18/21 1813   [MAR Hold] insulin glargine-yfgn (SEMGLEE) injection 10 Units  10 Units Subcutaneous Daily Darliss Cheney, MD   10 Units at 10/18/21 0916   [MAR Hold] metoprolol succinate (TOPROL-XL) 24 hr tablet 100 mg  100 mg Oral Daily Lavina Hamman, MD   100 mg at 10/18/21 0854   [MAR Hold] mupirocin ointment (BACTROBAN) 2 % 1 application  1 application Nasal BID Darliss Cheney, MD   1 application at 65/03/54 2331   [MAR Hold] ondansetron (ZOFRAN) tablet 4 mg  4 mg Oral Q6H PRN Lavina Hamman, MD       Or   Doug Sou Hold] ondansetron [facility]) injection 4 mg  4 mg Intravenous Q6H PRN Lavina Hamman, MD       [MAR Hold] pantoprazole (PROTONIX) EC tablet 40 mg  40 mg Oral BID Alvira Philips, RPH   40 mg at 10/18/21 1948   [MAR Hold] polyethylene glycol (MIRALAX / GLYCOLAX) packet 17 g  17 g Oral BID Vena Rua, PA-C       povidone-iodine 10 % swab 2 application  2 application Topical Once [name], MD       tranexamic acid (CYKLOKAPRON) 2,000 mg in sodium chloride 0.9 % 50 mL Topical Application  6,568 mg Topical To OR [name], MD       tranexamic acid (CYKLOKAPRON) IVPB 1,000 mg  1,000 mg Intravenous To OR [name], MD       [MAR Hold] vancomycin (VANCOCIN) IVPB 1000 mg/200 mL premix  1,000 mg Intravenous Q12H von DohlenHildred Alamin B, RPH 200 mL/hr at 10/19/21 0511 1,000 mg at 10/19/21 1275     Discharge Medications: Please see discharge summary for a list of discharge  medications.  Relevant Imaging Results:  Relevant Lab Results:   Additional Information SSN [place]  [place], [place]

## 2021-10-19 NOTE — Progress Notes (Signed)
PROGRESS NOTE    TAREK CRAVENS  UUE:[id number] DOB: 02-May-1956 DOA: 10/14/2021 PCP: Manon Hilding, MD   Brief Narrative:  [name] [name] is a 66 y.o. male with medical history significant for chronic venous stasis, lymphedema, CVA, asbestosis, hypertension, OSA not on CPAP, who presents with concerns of worsening left ankle pain. 08/27/2021 feels he rolled his ankle when standing. 09/15/2021 seen by orthopedics, x-ray performed.  Diagnosed to have a sprain and ankle 12/28 seen by wound care at which time patient complains that he is barely able to walk. 1/27 presents to ER with worsening left leg pain and redness.  Admitted for cellulitis.  MRI ordered. 1/28 MRI positive for osteomyelitis of the ankle joint as well as hindfoot and septic arthritis.  Orthopedic consulted.   Assessment & Plan:   Principal Problem:   Osteomyelitis of ankle and foot (HCC) Active Problems:   Morbid obesity (HCC)   OSA (obstructive sleep apnea)   Essential hypertension   Chronic diastolic heart failure (HCC)   AKI (acute kidney injury) (HCC)   Anemia   Pressure ulcer   History of CVA (cerebrovascular accident)   Septic arthritis of ankle and foot region [name] [name])   Left leg pain  Osteomyelitis of left ankle and foot (HCC)/septic arthritis- (present on admission) Has chronic venous stasis as well as severe lymphedema. Left leg has weeping ulcers with purulent and necrotic areas. Started on broad-spectrum IV antibiotics. MRI lower extremity confirms presence of osteomyelitis of the ankle and foot area as well as septic arthritis of the multiple ankle joints.  Seen by orthopedics.  Plan for left BKA today.  Continue to biotics.   Lower GI bleed? Patient says his home nurses have noted intermittent red blood in stool for the past month or so. Hgb here is 9s from baseline of normal. No hematemesis or melena.  Hemoglobin stable between 8-9. -Assessed by GI.  Holding plavix and aspirin.  Plan for EGD and  colonoscopy today.  Continue PPI.  History of CVA (cerebrovascular accident): Holding aspirin and Plavix for now. - cont statin   Pressure ulcer- (present on admission) Stage II bilateral buttock ulcer, present on admission. Wound care consulted. Low-air-loss mattress requested. Continue dressing changes.   Pressure Injury 10/15/21 Buttocks Right;Left Stage 2 -  Partial thickness loss of dermis presenting as a shallow open injury with a red, pink wound bed without slough. pt with multiple areas of stage ii pressure injury on both sides of the buttocks, also (Active) 10/15/21 1635 Location: Buttocks Location Orientation: Right;Left Staging: Stage 2 -  Partial thickness loss of dermis presenting as a shallow open injury with a red, pink wound bed without slough. Wound Description (Comments): pt with multiple areas of stage ii pressure injury on both sides of the buttocks, also purple discoloration in area as well, sacral foam applied Present on Admission: Yes   AKI (acute kidney injury) ([place])- (present on admission) Baseline serum creatinine around 1.3.  On presentation serum creatinine 1.74.  Received IV fluids.  Now much better than his baseline.   Chronic diastolic heart failure ([place])- (present on admission) Essential hypertension. Patient takes Lasix at home as needed.   Does not appear to be decompensated For now Lasix is on hold. Will continue Toprol-XL.   Morbid obesity ([place])- (present on admission) Body mass index is 50.22 kg/m.  Placing the patient at high risk for poor outcome. Not on cpap at home   T2DM Not on meds at home.  Blood sugar controlled  on SSI    DVT prophylaxis:    Code Status: Full Code  Family Communication:  None present at bedside.  Plan of care discussed with patient in length and he/she verbalized understanding and agreed with it.  Status is: Inpatient  Remains inpatient appropriate because: Needs assessment by Dr. Sharol Given on Tuesday  with  Estimated body mass index is 50.36 kg/m as calculated from the following:   Height as of this encounter: 5\' 10"  (1.778 m).   Weight as of this encounter: 159.2 kg.  Pressure Injury 10/15/21 Buttocks Right;Left Stage 2 -  Partial thickness loss of dermis presenting as a shallow open injury with a red, pink wound bed without slough. patchy areas of partial thickness skin loss related to moiture associated skin damage (Active)  10/15/21 1635  Location: Buttocks  Location Orientation: Right;Left  Staging: Stage 2 -  Partial thickness loss of dermis presenting as a shallow open injury with a red, pink wound bed without slough.  Wound Description (Comments): patchy areas of partial thickness skin loss related to moiture associated skin damage  Present on Admission: Yes   Nutritional Assessment: Body mass index is 50.36 kg/m.[place] Kitchen Seen by dietician.  I agree with the assessment and plan as outlined below: Nutrition Status:        . Skin Assessment: I have examined the patient's skin and I agree with the wound assessment as performed by the wound care RN as outlined below: Pressure Injury 10/15/21 Buttocks Right;Left Stage 2 -  Partial thickness loss of dermis presenting as a shallow open injury with a red, pink wound bed without slough. patchy areas of partial thickness skin loss related to moiture associated skin damage (Active)  10/15/21 1635  Location: Buttocks  Location Orientation: Right;Left  Staging: Stage 2 -  Partial thickness loss of dermis presenting as a shallow open injury with a red, pink wound bed without slough.  Wound Description (Comments): patchy areas of partial thickness skin loss related to moiture associated skin damage  Present on Admission: Yes    Consultants:  Orthopedics GI Procedures:  None  Antimicrobials:  Anti-infectives (From admission, onward)    Start     Dose/Rate Route Frequency Ordered Stop   10/18/21 0330  [MAR Hold]  vancomycin  (VANCOCIN) IVPB 1000 mg/200 mL premix        (MAR Hold since Wed 10/19/2021 at 1006.Hold Reason: Transfer to a Procedural area)   1,000 mg 200 mL/hr over 60 Minutes Intravenous Every 12 hours 10/17/21 1135     10/16/21 1600  vancomycin (VANCOCIN) 2,500 mg in sodium chloride 0.9 % 500 mL IVPB  Status:  Discontinued        2,500 mg 250 mL/hr over 120 Minutes Intravenous Every 36 hours 10/15/21 0327 10/17/21 1135   10/16/21 1030  metroNIDAZOLE (FLAGYL) IVPB 500 mg  Status:  Discontinued        500 mg 100 mL/hr over 60 Minutes Intravenous Every 12 hours 10/16/21 0936 10/17/21 1314   10/15/21 1000  [MAR Hold]  ceFEPIme (MAXIPIME) 2 g in sodium chloride 0.9 % 100 mL IVPB        (MAR Hold since Wed 10/19/2021 at 1006.Hold Reason: Transfer to a Procedural area)   2 g 200 mL/hr over 30 Minutes Intravenous Every 8 hours 10/15/21 0327     10/15/21 0130  vancomycin (VANCOCIN) IVPB 1000 mg/200 mL premix  Status:  Discontinued        1,000 mg 200 mL/hr over 60 Minutes Intravenous  Once  10/15/21 0117 10/15/21 0129   10/15/21 0130  ceFEPIme (MAXIPIME) 2 g in sodium chloride 0.9 % 100 mL IVPB        2 g 200 mL/hr over 30 Minutes Intravenous  Once 10/15/21 0117 10/15/21 0918   10/15/21 0130  vancomycin (VANCOREADY) IVPB 2000 mg/400 mL        2,000 mg 200 mL/hr over 120 Minutes Intravenous  Once 10/15/21 0129 10/15/21 0918         Subjective: Seen and examined.  Fianc at the bedside.  Patient has no new complaint today.  He is little nervous about the surgery.  Objective: Vitals:   10/18/21 1947 10/19/21 0300 10/19/21 0750 10/19/21 1014  BP: (!) 158/94 (!) 155/82 139/64 (!) 176/94  Pulse: 74 73 74 74  Resp: 19 18 16  (!) 22  Temp: 98 F (36.7 C) 98.7 F (37.1 C) 97.9 F (36.6 C) 98.4 F (36.9 C)  TempSrc: Oral Oral Oral Temporal  SpO2: 95% 93% 94% 94%  Weight:      Height:        Intake/Output Summary (Last 24 hours) at 10/19/2021 1030 Last data filed at 10/19/2021 0542 Gross per 24 hour   Intake 420 ml  Output 3200 ml  Net -2780 ml    Filed Weights   10/14/21 1821 10/15/21 2159  Weight: (!) 158.8 kg (!) 159.2 kg    Examination:  General exam: Appears calm and comfortable, morbidly obese Respiratory system: Clear to auscultation. Respiratory effort normal. Cardiovascular system: S1 & S2 heard, RRR. No JVD, murmurs, rubs, gallops or clicks. No pedal edema. Gastrointestinal system: Abdomen is nondistended, soft and nontender. No organomegaly or masses felt. Normal bowel sounds heard. Central nervous system: Alert and oriented. No focal neurological deficits. Extremities: Symmetric 5 x 5 power. Skin: Dressing in bilateral lower extremities. Psychiatry: Judgement and insight appear normal. Mood & affect appropriate.   Data Reviewed: I have personally reviewed following labs and imaging studies  CBC: Recent Labs  Lab 10/14/21 2035 10/15/21 0330 10/16/21 1221 10/17/21 0416 10/18/21 0159 10/19/21 0251  WBC 12.0* 10.3 8.6 8.9 8.1 7.5  NEUTROABS 9.6*  --   --   --   --   --   HGB 9.4* 9.5* 8.1* 8.6* 8.4* 9.0*  HCT 32.6* 31.5* 26.9* 28.3* 27.9* 29.3*  MCV 79.1* 78.9* 79.1* 79.1* 78.2* 78.8*  PLT 305 282 227 235 225 858    Basic Metabolic Panel: Recent Labs  Lab 10/15/21 0330 10/16/21 0142 10/16/21 1221 10/17/21 0416 10/18/21 0159 10/19/21 0251  NA 134*  --  136 136 135 137  K 3.8  --  4.3 4.3 4.5 4.4  CL 105  --  104 103 103 104  CO2 23  --  24 24 22 24   GLUCOSE 215*  --  196* 139* 162* 143*  BUN 64*  --  42* 37* 31* 23  CREATININE 1.68* 1.33* 1.48* 1.30* 1.14 1.17  CALCIUM 8.3*  --  8.7* 8.6* 8.6* 8.8*    GFR: Estimated Creatinine Clearance: 94.4 mL/min (by C-G formula based on SCr of 1.17 mg/dL). Liver Function Tests: Recent Labs  Lab 10/14/21 2035  AST 11*  ALT 14  ALKPHOS 51  BILITOT 0.4  PROT 7.1  ALBUMIN 2.5*    No results for input(s): LIPASE, AMYLASE in the last 168 hours. No results for input(s): AMMONIA in the last 168  hours. Coagulation Profile: No results for input(s): INR, PROTIME in the last 168 hours. Cardiac Enzymes: No results for  input(s): CKTOTAL, CKMB, CKMBINDEX, TROPONINI in the last 168 hours. BNP (last 3 results) No results for input(s): PROBNP in the last 8760 hours. HbA1C: Recent Labs    10/17/21 0416  HGBA1C 7.3*    CBG: Recent Labs  Lab 10/17/21 2340 10/18/21 0809 10/18/21 1148 10/18/21 1658 10/18/21 2124  GLUCAP 163* 144* 148* 143* 137*    Lipid Profile: No results for input(s): CHOL, HDL, LDLCALC, TRIG, CHOLHDL, LDLDIRECT in the last 72 hours. Thyroid Function Tests: No results for input(s): TSH, T4TOTAL, FREET4, T3FREE, THYROIDAB in the last 72 hours. Anemia Panel: Recent Labs    10/17/21 0416  VITAMINB12 185  FOLATE 7.0  FERRITIN 237  TIBC 227*  IRON 23*  RETICCTPCT 1.2    Sepsis Labs: Recent Labs  Lab 10/14/21 2035  LATICACIDVEN 1.4     Recent Results (from the past 240 hour(s))  Resp Panel by RT-PCR (Flu A&B, Covid) Nasopharyngeal Swab     Status: None   Collection Time: 10/14/21 11:36 PM   Specimen: Nasopharyngeal Swab; Nasopharyngeal(NP) swabs in vial transport medium  Result Value Ref Range Status   SARS Coronavirus 2 by RT PCR NEGATIVE NEGATIVE Final    Comment: (NOTE) SARS-CoV-2 target nucleic acids are NOT DETECTED.  The SARS-CoV-2 RNA is generally detectable in upper respiratory specimens during the acute phase of infection. The lowest concentration of SARS-CoV-2 viral copies this assay can detect is 138 copies/mL. A negative result does not preclude SARS-Cov-2 infection and should not be used as the sole basis for treatment or other patient management decisions. A negative result may occur with  improper specimen collection/handling, submission of specimen other than nasopharyngeal swab, presence of viral mutation(s) within the areas targeted by this assay, and inadequate number of viral copies(<138 copies/mL). A negative result must  be combined with clinical observations, patient history, and epidemiological information. The expected result is Negative.  Fact Sheet for Patients:  EntrepreneurPulse.com.au  Fact Sheet for Healthcare Providers:  IncredibleEmployment.be  This test is no t yet approved or cleared by the [place] FDA and  has been authorized for detection and/or diagnosis of SARS-CoV-2 by FDA under an Emergency Use Authorization (EUA). This EUA will remain  in effect (meaning this test can be used) for the duration of the COVID-19 declaration under Section 564(b)(1) of the Act, 21 U.S.C.section 360bbb-3(b)(1), unless the authorization is terminated  or revoked sooner.       Influenza A by PCR NEGATIVE NEGATIVE Final   Influenza B by PCR NEGATIVE NEGATIVE Final    Comment: (NOTE) The Xpert Xpress SARS-CoV-2/FLU/RSV plus assay is intended as an aid in the diagnosis of influenza from Nasopharyngeal swab specimens and should not be used as a sole basis for treatment. Nasal washings and aspirates are unacceptable for Xpert Xpress SARS-CoV-2/FLU/RSV testing.  Fact Sheet for Patients: EntrepreneurPulse.com.au  Fact Sheet for Healthcare Providers: IncredibleEmployment.be  This test is not yet approved or cleared by the [place] FDA and has been authorized for detection and/or diagnosis of SARS-CoV-2 by FDA under an Emergency Use Authorization (EUA). This EUA will remain in effect (meaning this test can be used) for the duration of the COVID-19 declaration under Section 564(b)(1) of the Act, 21 U.S.C. section 360bbb-3(b)(1), unless the authorization is terminated or revoked.  Performed at [name], [place] [address] [place], [place] [address]   Surgical pcr screen     Status: Abnormal   Collection Time: 10/18/21  7:55 PM   Specimen: Nasal Mucosa; Nasal Swab  Result Value Ref Range Status    MRSA, PCR NEGATIVE NEGATIVE Final   Staphylococcus aureus POSITIVE (A) NEGATIVE Final    Comment: (NOTE) The Xpert SA Assay (FDA approved for NASAL specimens in patients 58 years of age and older), is one component of a comprehensive surveillance program. It is not intended to diagnose infection nor to guide or monitor treatment. Performed at [name], [place] [address], [place], [place] [address]       Radiology Studies: No results found.  Scheduled Meds:  [MAR Hold] sodium chloride   Intravenous Once   [MAR Hold] allopurinol  300 mg Oral Daily   [MAR Hold] atorvastatin  10 mg Oral Daily   chlorhexidine  60 mL Topical Once   [MAR Hold] Chlorhexidine Gluconate Cloth  6 each Topical Q0600   [MAR Hold] docusate sodium  100 mg Oral BID   [MAR Hold] hydrALAZINE  50 mg Oral BID   [MAR Hold] insulin aspart  0-15 Units Subcutaneous TID WC   [MAR Hold] insulin glargine-yfgn  10 Units Subcutaneous Daily   [MAR Hold] metoprolol succinate  100 mg Oral Daily   [MAR Hold] mupirocin ointment  1 application Nasal BID   [MAR Hold] pantoprazole  40 mg Oral BID   [MAR Hold] polyethylene glycol  17 g Oral BID   povidone-iodine  2 application Topical Once   tranexamic acid (CYKLOKAPRON) topical - INTRAOP  2,000 mg Topical To OR   Continuous Infusions:  [MAR Hold] ceFEPime (MAXIPIME) IV 2 g (10/19/21 0258)   tranexamic acid     [MAR Hold] vancomycin 1,000 mg (10/19/21 0511)     LOS: 4 days   Time spent: 25 minutes  Darliss Cheney, MD [facility]  10/19/2021, 10:30 AM  Please page via Shea [place] and do not message via secure chat for urgent patient care matters. Secure chat can be used for non urgent patient care matters.  How to contact the [name] Attending or Consulting provider [place] or covering provider during after hours [place], for this patient?  Check the care team in [name] and look for a) attending/consulting TRH provider listed and b) the [facility] team listed. Page or secure chat  7A-7P. Log into [email or web address] and use [place]'s universal password to access. If you do not have the password, please contact the [name] operator. Locate the [name] provider you are looking for under [facility] and page to a number that you can be directly reached. If you still have difficulty reaching the provider, please page the [name] (Director on Call) for the Hospitalists listed on amion for assistance.

## 2021-10-19 NOTE — Transfer of Care (Signed)
Immediate Anesthesia Transfer of Care Note  Patient: [name] [name]  Procedure(s) Performed: ESOPHAGOGASTRODUODENOSCOPY (EGD) WITH PROPOFOL COLONOSCOPY BIOPSY POLYPECTOMY  Patient Location: PACU  Anesthesia Type:MAC  Level of Consciousness: drowsy and patient cooperative  Airway & Oxygen Therapy: Patient Spontanous Breathing and Patient connected to nasal cannula oxygen  Post-op Assessment: Report given to RN and Post -op Vital signs reviewed and stable  Post vital signs: Reviewed and stable  Last Vitals:  Vitals Value Taken Time  BP 160/83 10/19/21 1300  Temp    Pulse 77 10/19/21 1306  Resp 17 10/19/21 1306  SpO2 98 % 10/19/21 1306  Vitals shown include unvalidated device data.  Last Pain:  Vitals:   10/19/21 1251  TempSrc:   PainSc: 0-No pain      Patients Stated Pain Goal: 3 (77/11/65 7903)  Complications: No notable events documented.

## 2021-10-19 NOTE — Interval H&P Note (Signed)
History and Physical Interval Note:  10/19/2021 10:59 AM  [name] [name]  has presented today for surgery, with the diagnosis of Rectal bleeding, anemia.  The various methods of treatment have been discussed with the patient and family. After consideration of risks, benefits and other options for treatment, the patient has consented to  Procedure(s): ESOPHAGOGASTRODUODENOSCOPY (EGD) WITH PROPOFOL (N/A) COLONOSCOPY (N/A) as a surgical intervention.  The patient's history has been reviewed, patient examined, no change in status, stable for surgery.  I have reviewed the patient's chart and labs.  Questions were answered to the patient's satisfaction.     Sharyn Creamer

## 2021-10-19 NOTE — TOC Initial Note (Signed)
Transition of Care [facility]) - Initial/Assessment Note    Patient Details  Name: [name] [name] MRN: [id number] Date of Birth: 1956/02/08  [facility] CM/SW Contact:    Bethann Berkshire, [place] Phone Number: 10/19/2021, 10:34 AM  Clinical Narrative:                  CSW met with pt bedside along with his significant other. Pt lives in [place] alone. He has 4 children, some in [place] area and one in [place]. Pt's s/o lives in [place]. CSW explained PT recommendation. S/o asked about inpatient rehab; CSW explained that IR is not being recommended. Explained they could discuss with PT for further questions about SNF recommendation. Pt is agreeable to SNF workup. He is covid vaccinated x2, no boosters. CSW will complete fl2 and fax bed requests in hub.   Expected Discharge Plan: Skilled Nursing Facility Barriers to Discharge: Continued Medical Work up   Patient Goals and CMS Choice Patient states their goals for this hospitalization and ongoing recovery are:: Agreeable to rehab      Expected Discharge Plan and Services Expected Discharge Plan: [place] arrangements for the past 2 months: Single Family Home                                      Prior Living Arrangements/Services Living arrangements for the past 2 months: Single Family Home Lives with:: Self Patient language and need for interpreter reviewed:: Yes        Need for Family Participation in Patient Care: No (Comment) Care giver support system in place?: No (comment)   Criminal Activity/Legal Involvement Pertinent to Current Situation/Hospitalization: No - Comment as needed  Activities of Daily Living Home Assistive Devices/Equipment: Environmental consultant (specify type), Wheelchair ADL Screening (condition at time of admission) Patient's cognitive ability adequate to safely complete daily activities?: Yes Is the patient deaf or have difficulty hearing?: No Does the patient  have difficulty seeing, even when wearing glasses/contacts?: Yes Does the patient have difficulty concentrating, remembering, or making decisions?: No Patient able to express need for assistance with ADLs?: No Does the patient have difficulty dressing or bathing?: Yes Independently performs ADLs?: No Communication: Independent Dressing (OT): Needs assistance Grooming: Needs assistance Feeding: Independent Bathing: Needs assistance Toileting: Needs assistance In/Out Bed: Needs assistance Walks in Home: Needs assistance Does the patient have difficulty walking or climbing stairs?: Yes Weakness of Legs: Both Weakness of Arms/Hands: Both  Permission Sought/Granted                  Emotional Assessment Appearance:: Appears stated age Attitude/Demeanor/Rapport: Engaged Affect (typically observed): Accepting Orientation: : Oriented to Self, Oriented to Place, Oriented to  Time, Oriented to Situation Alcohol / Substance Use: Not Applicable Psych Involvement: No (comment)  Admission diagnosis:  Cellulitis [L03.90] Dyspnea [R06.00] Left leg pain [M79.605] AKI (acute kidney injury) ([place]) [N17.9] Impaired mobility and ADLs [Z74.09, Z78.9] Anemia, unspecified type [D64.9] Patient Active Problem List   Diagnosis Date Noted   Left leg pain    Osteomyelitis of ankle and foot ([place]) 10/15/2021   Anemia 10/15/2021   Pressure ulcer 10/15/2021   History of CVA (cerebrovascular accident) 10/15/2021   Septic arthritis of ankle and foot region ([place]) 10/15/2021   AKI (acute kidney injury) ([place]) 10/14/2021   Pneumonia due to COVID-19 virus 09/09/2019   Hypokalemia 09/09/2019  Acute respiratory failure with hypoxia ([place]) 09/09/2019   Essential hypertension 10/14/2014   Chronic diastolic heart failure ([place]) 10/14/2014   Morbid obesity ([place]) 07/22/2013   Varicose veins of lower extremities with other complications 53/29/9242   OSA (obstructive sleep apnea) 07/22/2013   Venous stasis  ulcers ([place]) 07/22/2013   Diabetes mellitus ([place]) 01/03/2011   PCP:  Manon Hilding, MD Pharmacy:   [id number] [place] [place]Phone: [phone]: [phone]    Social Determinants of Health (SDOH) Interventions    Readmission Risk Interventions No flowsheet data found.

## 2021-10-20 ENCOUNTER — Inpatient Hospital Stay (HOSPITAL_COMMUNITY): Payer: Medicare Other

## 2021-10-20 DIAGNOSIS — M79605 Pain in left leg: Secondary | ICD-10-CM

## 2021-10-20 LAB — GLUCOSE, CAPILLARY
Glucose-Capillary: 190 mg/dL — ABNORMAL HIGH (ref 70–99)
Glucose-Capillary: 178 mg/dL — ABNORMAL HIGH (ref 70–99)
Glucose-Capillary: 169 mg/dL — ABNORMAL HIGH (ref 70–99)
Glucose-Capillary: 160 mg/dL — ABNORMAL HIGH (ref 70–99)

## 2021-10-20 LAB — CBC
HCT: 29.5 % — ABNORMAL LOW (ref 39.0–52.0)
Hemoglobin: 9 g/dL — ABNORMAL LOW (ref 13.0–17.0)
MCH: 23.5 pg — ABNORMAL LOW (ref 26.0–34.0)
MCHC: 30.5 g/dL (ref 30.0–36.0)
MCV: 77 fL — ABNORMAL LOW (ref 80.0–100.0)
Platelets: 257 10*3/uL (ref 150–400)
RBC: 3.83 MIL/uL — ABNORMAL LOW (ref 4.22–5.81)
RDW: 19 % — ABNORMAL HIGH (ref 11.5–15.5)
WBC: 8.8 10*3/uL (ref 4.0–10.5)
nRBC: 0 % (ref 0.0–0.2)

## 2021-10-20 LAB — BASIC METABOLIC PANEL
Anion gap: 9 (ref 5–15)
BUN: 19 mg/dL (ref 8–23)
CO2: 25 mmol/L (ref 22–32)
Calcium: 8.8 mg/dL — ABNORMAL LOW (ref 8.9–10.3)
Chloride: 101 mmol/L (ref 98–111)
Creatinine, Ser: 1.12 mg/dL (ref 0.61–1.24)
GFR, Estimated: >60 mL/min (ref >60)
Glucose, Bld: 167 mg/dL — ABNORMAL HIGH (ref 70–99)
Potassium: 4.5 mmol/L (ref 3.5–5.1)
Sodium: 135 mmol/L (ref 135–145)

## 2021-10-20 LAB — BRAIN NATRIURETIC PEPTIDE: B Natriuretic Peptide: 538.5 pg/mL — ABNORMAL HIGH (ref 0.0–100.0)

## 2021-10-20 MED ORDER — SODIUM CHLORIDE 0.9% IV SOLUTION: Freq: Once | INTRAVENOUS | Status: DC

## 2021-10-20 MED ORDER — FUROSEMIDE 10 MG/ML IJ SOLN
40.0000 mg | Freq: Once | INTRAMUSCULAR | Status: AC
  Administered 2021-10-20: 40 mg via INTRAVENOUS
  Filled 2021-10-20: qty 4

## 2021-10-20 MED ORDER — POVIDONE-IODINE 10 % EX SWAB: 2.0000 "application " | Freq: Once | CUTANEOUS | Status: DC

## 2021-10-20 MED ORDER — CHLORHEXIDINE GLUCONATE 4 % EX LIQD
60.0000 mL | Freq: Once | CUTANEOUS | Status: AC
  Administered 2021-10-21: 4 via TOPICAL
  Filled 2021-10-20 (×2): qty 60

## 2021-10-20 NOTE — Progress Notes (Signed)
PROGRESS NOTE    [name]  QVZ:[id number] DOB: 1956/07/02 DOA: 10/14/2021 PCP: Manon Hilding, MD   Brief Narrative:  [name] [name] is a 66 y.o. male with medical history significant for chronic venous stasis, lymphedema, CVA, asbestosis, hypertension, OSA not on CPAP, who presents with concerns of worsening left ankle pain. 08/27/2021 feels he rolled his ankle when standing. 09/15/2021 seen by orthopedics, x-ray performed.  Diagnosed to have a sprain and ankle 12/28 seen by wound care at which time patient complains that he is barely able to walk. 1/27 presents to ER with worsening left leg pain and redness.  Admitted for cellulitis.  MRI ordered. 1/28 MRI positive for osteomyelitis of the ankle joint as well as hindfoot and septic arthritis.  Orthopedic consulted.   Assessment & Plan:   Principal Problem:   Osteomyelitis of ankle and foot (HCC) Active Problems:   Morbid obesity (HCC)   OSA (obstructive sleep apnea)   Essential hypertension   Chronic diastolic heart failure (HCC)   AKI (acute kidney injury) (HCC)   Anemia   Pressure ulcer   History of CVA (cerebrovascular accident)   Septic arthritis of ankle and foot region [facility])   Left leg pain  Osteomyelitis of left ankle and foot (HCC)/septic arthritis- (present on admission) Has chronic venous stasis as well as severe lymphedema. Left leg has weeping ulcers with purulent and necrotic areas. Started on broad-spectrum IV antibiotics. MRI lower extremity confirms presence of osteomyelitis of the ankle and foot area as well as septic arthritis of the multiple ankle joints.  Seen by orthopedics.  Plan for BKA yesterday however patient was in endoscopy unit so surgery was canceled and now rescheduled for Friday.  Continue current antibiotics.    Upper and lower GI bleeding/gastric erosions/gastritis/ulcer at the ileocecal valve/multiple polyps in the colon: Hemoglobin has remained stable.  S/p EGD and colonoscopy  10/19/2021.  GI recommends continuing PPI 40 mg twice daily for 8 weeks.  Aspirin and Plavix on hold.  We will reach out to GI to ask resumption date.  History of CVA (cerebrovascular accident): Holding aspirin and Plavix for now. - cont statin   Pressure ulcer- (present on admission) Stage II bilateral buttock ulcer, present on admission. Wound care consulted. Low-air-loss mattress requested. Continue dressing changes.   Pressure Injury 10/15/21 Buttocks Right;Left Stage 2 -  Partial thickness loss of dermis presenting as a shallow open injury with a red, pink wound bed without slough. pt with multiple areas of stage ii pressure injury on both sides of the buttocks, also (Active) 10/15/21 1635 Location: Buttocks Location Orientation: Right;Left Staging: Stage 2 -  Partial thickness loss of dermis presenting as a shallow open injury with a red, pink wound bed without slough. Wound Description (Comments): pt with multiple areas of stage ii pressure injury on both sides of the buttocks, also purple discoloration in area as well, sacral foam applied Present on Admission: Yes   AKI (acute kidney injury) ([place])- (present on admission) Baseline serum creatinine around 1.3.  On presentation serum creatinine 1.74.  Received IV fluids.  Now much better than his baseline.   Acute on chronic diastolic heart failure (HCC)-patient feels short of breath.  Chest x-ray shows vascular congestion but no overt pulmonary edema.  BNP elevated.  He takes Lasix p.o. as needed.  We will give him Lasix 40 mg IV x1.  Reassess tomorrow.  Continue Toprol-XL.  Morbid obesity ([place])- (present on admission) Body mass index is 50.22 kg/m.  Placing  the patient at high risk for poor outcome. Not on cpap at home   T2DM Not on meds at home.  Blood sugar controlled on SSI    DVT prophylaxis:    Code Status: Full Code  Family Communication:  None present at bedside.  Plan of care discussed with patient in length and  he/she verbalized understanding and agreed with it.  Status is: Inpatient  Remains inpatient appropriate because: Needs BKA.  Will likely need SNF after that.  Estimated body mass index is 50.36 kg/m as calculated from the following:   Height as of this encounter: 5\' 10"  (1.778 m).   Weight as of this encounter: 159.2 kg.  Pressure Injury 10/15/21 Buttocks Right;Left Stage 2 -  Partial thickness loss of dermis presenting as a shallow open injury with a red, pink wound bed without slough. patchy areas of partial thickness skin loss related to moiture associated skin damage (Active)  10/15/21 1635  Location: Buttocks  Location Orientation: Right;Left  Staging: Stage 2 -  Partial thickness loss of dermis presenting as a shallow open injury with a red, pink wound bed without slough.  Wound Description (Comments): patchy areas of partial thickness skin loss related to moiture associated skin damage  Present on Admission: Yes   Nutritional Assessment: Body mass index is 50.36 kg/m.Marland Kitchen Seen by dietician.  I agree with the assessment and plan as outlined below: Nutrition Status:        . Skin Assessment: I have examined the patient's skin and I agree with the wound assessment as performed by the wound care RN as outlined below: Pressure Injury 10/15/21 Buttocks Right;Left Stage 2 -  Partial thickness loss of dermis presenting as a shallow open injury with a red, pink wound bed without slough. patchy areas of partial thickness skin loss related to moiture associated skin damage (Active)  10/15/21 1635  Location: Buttocks  Location Orientation: Right;Left  Staging: Stage 2 -  Partial thickness loss of dermis presenting as a shallow open injury with a red, pink wound bed without slough.  Wound Description (Comments): patchy areas of partial thickness skin loss related to moiture associated skin damage  Present on Admission: Yes    Consultants:  Orthopedics GI Procedures:   None  Antimicrobials:  Anti-infectives (From admission, onward)    Start     Dose/Rate Route Frequency Ordered Stop   10/18/21 0330  vancomycin (VANCOCIN) IVPB 1000 mg/200 mL premix        1,000 mg 200 mL/hr over 60 Minutes Intravenous Every 12 hours 10/17/21 1135     10/16/21 1600  vancomycin (VANCOCIN) 2,500 mg in sodium chloride 0.9 % 500 mL IVPB  Status:  Discontinued        2,500 mg 250 mL/hr over 120 Minutes Intravenous Every 36 hours 10/15/21 0327 10/17/21 1135   10/16/21 1030  metroNIDAZOLE (FLAGYL) IVPB 500 mg  Status:  Discontinued        500 mg 100 mL/hr over 60 Minutes Intravenous Every 12 hours 10/16/21 0936 10/17/21 1314   10/15/21 1000  ceFEPIme (MAXIPIME) 2 g in sodium chloride 0.9 % 100 mL IVPB        2 g 200 mL/hr over 30 Minutes Intravenous Every 8 hours 10/15/21 0327     10/15/21 0130  vancomycin (VANCOCIN) IVPB 1000 mg/200 mL premix  Status:  Discontinued        1,000 mg 200 mL/hr over 60 Minutes Intravenous  Once 10/15/21 0117 10/15/21 0129   10/15/21 0130  ceFEPIme (  MAXIPIME) 2 g in sodium chloride 0.9 % 100 mL IVPB        2 g 200 mL/hr over 30 Minutes Intravenous  Once 10/15/21 0117 10/15/21 0918   10/15/21 0130  vancomycin (VANCOREADY) IVPB 2000 mg/400 mL        2,000 mg 200 mL/hr over 120 Minutes Intravenous  Once 10/15/21 0129 10/15/21 4801         Subjective: Patient seen and examined.  Was complaining of some shortness of breath but otherwise no other complaint.  Looked comfortable and stable though.  Objective: Vitals:   10/19/21 1607 10/19/21 2053 10/20/21 0446 10/20/21 0833  BP: (!) 158/92 (!) 147/81 (!) 151/91 132/73  Pulse: 78 80 74 81  Resp: 16 (!) 22 20 18   Temp: 100.3 F (37.9 C) 98.6 F (37 C) 97.8 F (36.6 C) 98.4 F (36.9 C)  TempSrc: Oral Oral  Oral  SpO2: 94% 94% 91% 92%  Weight:      Height:        Intake/Output Summary (Last 24 hours) at 10/20/2021 1104 Last data filed at 10/19/2021 2054 Gross per 24 hour  Intake --   Output 1350 ml  Net -1350 ml    Filed Weights   10/14/21 1821 10/15/21 2159  Weight: (!) 158.8 kg (!) 159.2 kg    Examination:  General exam: Appears calm and comfortable, morbidly obese Respiratory system: Clear to auscultation. Respiratory effort normal. Cardiovascular system: S1 & S2 heard, RRR. No JVD, murmurs, rubs, gallops or clicks.  Chronic lymphedema bilateral lower extremity. Gastrointestinal system: Abdomen is nondistended, soft and nontender. No organomegaly or masses felt. Normal bowel sounds heard. Central nervous system: Alert and oriented. No focal neurological deficits. Extremities: Symmetric 5 x 5 power. Skin: No rashes, lesions or ulcers.  Psychiatry: Judgement and insight appear normal. Mood & affect appropriate.    Data Reviewed: I have personally reviewed following labs and imaging studies  CBC: Recent Labs  Lab 10/14/21 2035 10/15/21 0330 10/16/21 1221 10/17/21 0416 10/18/21 0159 10/19/21 0251 10/20/21 0123  WBC 12.0*   < > 8.6 8.9 8.1 7.5 8.8  NEUTROABS 9.6*  --   --   --   --   --   --   HGB 9.4*   < > 8.1* 8.6* 8.4* 9.0* 9.0*  HCT 32.6*   < > 26.9* 28.3* 27.9* 29.3* 29.5*  MCV 79.1*   < > 79.1* 79.1* 78.2* 78.8* 77.0*  PLT 305   < > 227 235 225 217 257   < > = values in this interval not displayed.    Basic Metabolic Panel: Recent Labs  Lab 10/16/21 1221 10/17/21 0416 10/18/21 0159 10/19/21 0251 10/20/21 0123  NA 136 136 135 137 135  K 4.3 4.3 4.5 4.4 4.5  CL 104 103 103 104 101  CO2 24 24 22 24 25   GLUCOSE 196* 139* 162* 143* 167*  BUN 42* 37* 31* 23 19  CREATININE 1.48* 1.30* 1.14 1.17 1.12  CALCIUM 8.7* 8.6* 8.6* 8.8* 8.8*    GFR: Estimated Creatinine Clearance: 98.6 mL/min (by C-G formula based on SCr of 1.12 mg/dL). Liver Function Tests: Recent Labs  Lab 10/14/21 2035  AST 11*  ALT 14  ALKPHOS 51  BILITOT 0.4  PROT 7.1  ALBUMIN 2.5*    No results for input(s): LIPASE, AMYLASE in the last 168 hours. No results  for input(s): AMMONIA in the last 168 hours. Coagulation Profile: No results for input(s): INR, PROTIME in the last 168  hours. Cardiac Enzymes: No results for input(s): CKTOTAL, CKMB, CKMBINDEX, TROPONINI in the last 168 hours. BNP (last 3 results) No results for input(s): PROBNP in the last 8760 hours. HbA1C: No results for input(s): HGBA1C in the last 72 hours.  CBG: Recent Labs  Lab 10/18/21 1148 10/18/21 1658 10/18/21 2124 10/19/21 1755 10/20/21 0834  GLUCAP 148* 143* 137* 154* 190*    Lipid Profile: No results for input(s): CHOL, HDL, LDLCALC, TRIG, CHOLHDL, LDLDIRECT in the last 72 hours. Thyroid Function Tests: No results for input(s): TSH, T4TOTAL, FREET4, T3FREE, THYROIDAB in the last 72 hours. Anemia Panel: No results for input(s): VITAMINB12, FOLATE, FERRITIN, TIBC, IRON, RETICCTPCT in the last 72 hours.  Sepsis Labs: Recent Labs  Lab 10/14/21 2035  LATICACIDVEN 1.4     Recent Results (from the past 240 hour(s))  Resp Panel by RT-PCR (Flu A&B, Covid) Nasopharyngeal Swab     Status: None   Collection Time: 10/14/21 11:36 PM   Specimen: Nasopharyngeal Swab; Nasopharyngeal(NP) swabs in vial transport medium  Result Value Ref Range Status   SARS Coronavirus 2 by RT PCR NEGATIVE NEGATIVE Final    Comment: (NOTE) SARS-CoV-2 target nucleic acids are NOT DETECTED.  The SARS-CoV-2 RNA is generally detectable in upper respiratory specimens during the acute phase of infection. The lowest concentration of SARS-CoV-2 viral copies this assay can detect is 138 copies/mL. A negative result does not preclude SARS-Cov-2 infection and should not be used as the sole basis for treatment or other patient management decisions. A negative result may occur with  improper specimen collection/handling, submission of specimen other than nasopharyngeal swab, presence of viral mutation(s) within the areas targeted by this assay, and inadequate number of viral copies(<138  copies/mL). A negative result must be combined with clinical observations, patient history, and epidemiological information. The expected result is Negative.  Fact Sheet for Patients:  EntrepreneurPulse.com.au  Fact Sheet for Healthcare Providers:  IncredibleEmployment.be  This test is no t yet approved or cleared by the [place] FDA and  has been authorized for detection and/or diagnosis of SARS-CoV-2 by FDA under an Emergency Use Authorization (EUA). This EUA will remain  in effect (meaning this test can be used) for the duration of the COVID-19 declaration under Section 564(b)(1) of the Act, 21 U.S.C.section 360bbb-3(b)(1), unless the authorization is terminated  or revoked sooner.       Influenza A by PCR NEGATIVE NEGATIVE Final   Influenza B by PCR NEGATIVE NEGATIVE Final    Comment: (NOTE) The Xpert Xpress SARS-CoV-2/FLU/RSV plus assay is intended as an aid in the diagnosis of influenza from Nasopharyngeal swab specimens and should not be used as a sole basis for treatment. Nasal washings and aspirates are unacceptable for Xpert Xpress SARS-CoV-2/FLU/RSV testing.  Fact Sheet for Patients: EntrepreneurPulse.com.au  Fact Sheet for Healthcare Providers: IncredibleEmployment.be  This test is not yet approved or cleared by the [place] FDA and has been authorized for detection and/or diagnosis of SARS-CoV-2 by FDA under an Emergency Use Authorization (EUA). This EUA will remain in effect (meaning this test can be used) for the duration of the COVID-19 declaration under Section 564(b)(1) of the Act, 21 U.S.C. section 360bbb-3(b)(1), unless the authorization is terminated or revoked.  Performed at [facility], [place] [address], [place], [place] [address]   Surgical pcr screen     Status: Abnormal   Collection Time: 10/18/21  7:55 PM   Specimen: Nasal Mucosa; Nasal Swab   Result Value Ref Range Status  MRSA, PCR NEGATIVE NEGATIVE Final   Staphylococcus aureus POSITIVE (A) NEGATIVE Final    Comment: (NOTE) The Xpert SA Assay (FDA approved for NASAL specimens in patients 86 years of age and older), is one component of a comprehensive surveillance program. It is not intended to diagnose infection nor to guide or monitor treatment. Performed at [facility], [place] [address], [place], [place] [address]       Radiology Studies: DG CHEST PORT 1 VIEW  Result Date: 10/20/2021 CLINICAL DATA:  Shortness of breath EXAM: PORTABLE CHEST 1 VIEW COMPARISON:  Previous studies including the examination of 10/15/2018 FINDINGS: Transverse diameter of heart is increased. Central pulmonary vessels are prominent without signs of alveolar pulmonary edema. Evaluation of lower lung fields is limited by patient's body habitus. There is increased density in the left lower lung fields with interval improvement. There is blunting of left lateral CP angle. There is no pneumothorax. IMPRESSION: Cardiomegaly. Central pulmonary vessels are prominent without signs of pulmonary edema. Increased density in the left lower lung fields may be due to pleural effusion and possibly underlying infiltrate. There is interval improvement in aeration of left lower lung fields. Electronically Signed   By: Elmer Picker M.D.   On: 10/20/2021 09:35    Scheduled Meds:  sodium chloride   Intravenous Once   allopurinol  300 mg Oral Daily   atorvastatin  10 mg Oral Daily   Chlorhexidine Gluconate Cloth  6 each Topical Q0600   docusate sodium  100 mg Oral BID   furosemide  40 mg Intravenous Once   hydrALAZINE  50 mg Oral BID   insulin aspart  0-15 Units Subcutaneous TID WC   insulin glargine-yfgn  10 Units Subcutaneous Daily   metoprolol succinate  100 mg Oral Daily   mupirocin ointment  1 application Nasal BID   pantoprazole  40 mg Oral BID   polyethylene glycol  17 g Oral BID   Continuous  Infusions:  ceFEPime (MAXIPIME) IV 2 g (10/20/21 0911)   vancomycin 1,000 mg (10/20/21 0618)     LOS: 5 days   Time spent: 26 minutes  Darliss Cheney, MD [facility]  10/20/2021, 11:04 AM  Please page via [place] [place] and do not message via secure chat for urgent patient care matters. Secure chat can be used for non urgent patient care matters.  How to contact the [facility] Attending or Consulting provider [place] or covering provider during after hours [place], for this patient?  Check the care team in [facility] and look for a) attending/consulting TR[facility] provider listed and b) the [facility] team listed. Page or secure chat [phone]. Log into [email or web address] and use [place]'s universal password to access. If you do not have the password, please contact the hospital operator. Locate the [facility] provider you are looking for under [facility] and page to a number that you can be directly reached. If you still have difficulty reaching the provider, please page the [facility] (Director on Call) for the Hospitalists listed on amion for assistance.

## 2021-10-20 NOTE — Progress Notes (Signed)
Occupational Therapy Treatment Patient Details Name: [name] [name] MRN: [id number] DOB: [birth year] Today's Date: 10/20/2021   History of present illness 66 y/o M presenting to [place] n 1/27 with L Leg pain x1 month, reports turning his L ankle when getting into his car in December, has not been able to bear weight thorugh LLE since. PMH includes morbid obesity, CHF, HTN, and CVA   OT comments  Session focused on progression of sitting balance and endurance during ADL tasks. Pt continues to benefit from +2 assist for safety during bed mobility to prevent sliding forward on sizewise bed. Pt initially with posterior lean sitting EOB that improved with time and deflating mattress. Pt able to sit EOB > 20 min for shaving task with mild SOB noted. Began education in prep for pending L BKA (tomorrow, 2/3) and the areas where assist may be needed. Continue to rec SNF rehab at DC. Will follow-up for OT re-eval s/p BKA.   Recommendations for follow up therapy are one component of a multi-disciplinary discharge planning process, led by the attending physician.  Recommendations may be updated based on patient status, additional functional criteria and insurance authorization.    Follow Up Recommendations  Skilled nursing-short term rehab (<3 hours/day)    Assistance Recommended at Discharge Frequent or constant Supervision/Assistance  Patient can return home with the following  A lot of help with bathing/dressing/bathroom;Two people to help with walking and/or transfers;Assistance with cooking/housework;Help with stairs or ramp for entrance;Assist for transportation   Equipment Recommendations  Wheelchair (measurements OT);Wheelchair cushion (measurements OT)    Recommendations for Other Services      Precautions / Restrictions Precautions Precautions: Fall Precaution Comments: monitor SOB Restrictions Weight Bearing Restrictions: No       Mobility Bed Mobility Overal bed mobility: Needs  Assistance Bed Mobility: Supine to Sit, Sit to Supine     Supine to sit: Mod assist, +2 for safety/equipment Sit to supine: Mod assist, +2 for safety/equipment   General bed mobility comments: Pt able to sit EOB Mod A x 1 though 2nd person needed for safety due to pt hips sliding forward on slick sizewise bed (deflated with increased safety noted). able to demo initial scooting EOB but difficult with current bed surface. Mod A to get BLE back into bed Pt able to assist in pulling self up with BUE on headboard    Transfers                         Balance Overall balance assessment: Needs assistance Sitting-balance support: Feet supported, Bilateral upper extremity supported Sitting balance-Leahy Scale: Fair Sitting balance - Comments: initially with posterior lean (improved with bed deflated), able to sit and complete shaving task > 20 min Postural control: Posterior lean                                 ADL either performed or assessed with clinical judgement   ADL Overall ADL's : Needs assistance/impaired     Grooming: Set up;Sitting Grooming Details (indicate cue type and reason): pt completed shaving task sitting EOB > 20 min, some assist for throughness and due to small mirror/lower lighting             Lower Body Dressing: Total assistance;Bed level Lower Body Dressing Details (indicate cue type and reason): to don R sock  General ADL Comments: Focus on endurance, core strength and sitting balance EOB.    Extremity/Trunk Assessment Upper Extremity Assessment Upper Extremity Assessment: RUE deficits/detail RUE Deficits / Details: hx of CVA and residual weakness, unable to lift UE beyond 90*, PROM WFL   Lower Extremity Assessment Lower Extremity Assessment: Defer to PT evaluation        Vision   Vision Assessment?: No apparent visual deficits   Perception     Praxis      Cognition Arousal/Alertness:  Awake/alert Behavior During Therapy: WFL for tasks assessed/performed Overall Cognitive Status: Within Functional Limits for tasks assessed                                          Exercises      Shoulder Instructions       General Comments      Pertinent Vitals/ Pain       Pain Assessment Pain Assessment: Faces Faces Pain Scale: Hurts a little bit Pain Location: LLE with movement Pain Descriptors / Indicators: Grimacing, Guarding Pain Intervention(s): Monitored during session  Home Living                                          Prior Functioning/Environment              Frequency  Min 2X/week        Progress Toward Goals  OT Goals(current goals can now be found in the care plan section)  Progress towards OT goals: Progressing toward goals  Acute Rehab OT Goals Patient Stated Goal: have rehab at hospital OT Goal Formulation: With patient Time For Goal Achievement: 10/30/21 Potential to Achieve Goals: Good  Plan Discharge plan remains appropriate;Frequency remains appropriate    Co-evaluation    PT/OT/SLP Co-Evaluation/Treatment: Yes Reason for Co-Treatment: For patient/therapist safety;To address functional/ADL transfers   OT goals addressed during session: ADL's and self-care      AM-PAC OT "6 Clicks" Daily Activity     Outcome Measure   Help from another person eating meals?: None Help from another person taking care of personal grooming?: None Help from another person toileting, which includes using toliet, bedpan, or urinal?: Total Help from another person bathing (including washing, rinsing, drying)?: Total Help from another person to put on and taking off regular upper body clothing?: A Little Help from another person to put on and taking off regular lower body clothing?: Total 6 Click Score: 14    End of Session    OT Visit Diagnosis: Unsteadiness on feet (R26.81);Muscle weakness (generalized)  (M62.81)   Activity Tolerance Patient tolerated treatment well   Patient Left in bed;with call bell/phone within reach   Nurse Communication Mobility status        Time: 8338-2505 OT Time Calculation (min): 45 min  Charges: OT General Charges $OT Visit: 1 Visit OT Treatments $Self Care/Home Management : 23-37 mins  Malachy Chamber, OTR/L Acute Rehab Services Office: [phone]   Layla Maw 10/20/2021, 10:26 AM

## 2021-10-20 NOTE — Progress Notes (Signed)
Patient ID: [name] [name], male   DOB: 04/20/1956, 66 y.o.   MRN: [id number] Patient was in endoscopic procedure yesterday at the proposed time for his amputation surgery.  Plan to proceed with left transtibial amputation on Friday.  Patient has no further questions.

## 2021-10-20 NOTE — TOC Progression Note (Signed)
Transition of Care [facility]) - Progression Note    Patient Details  Name: [name] [name] MRN: [id number] Date of Birth: [birth year]  Transition of Care [facility]) CM/SW [place], [place] Phone Number: 10/20/2021, 11:53 AM  Clinical Narrative:   CSW met with patient and family at bedside to provide bed offers. CSW discussed options available, and updated patient and family to review and let CSW know about choices. CSW to follow.    Expected Discharge Plan: [place] Barriers to Discharge: Continued Medical Work up  Expected Discharge Plan and Services Expected Discharge Plan: [place] arrangements for the past 2 months: Single Family Home                                       Social Determinants of Health (SDOH) Interventions    Readmission Risk Interventions No flowsheet data found.

## 2021-10-20 NOTE — Progress Notes (Signed)
Pharmacy Antibiotic Note  [name] [name] is a 66 y.o. male admitted on 10/14/2021 with worsening left ankle pain. Pharmacy has been consulted to dose cefepime and vancomycin for osteomyelitis, septic arthritis. Day #6 of antibiotics. SCr stable at 1.12.  EGD procedure was scheduled at same time of planned amputation surgery yesterday. New plan per Ortho is for transtibial amputation procedure 2/3.  Plan: Continue vancomycin to 1g IV q12h. Goal AUC 400-550. Expected AUC: 419 SCr used: 1.12 Cefepime 2g IV q8h Monitor clinical progress, c/s, renal function F/u de-escalation plan/LOT Will hold off on checking vancomycin levels at this time, as may be d/c'd soon after amputation procedure rescheduled for 2/3   Height: 5\' 10"  (177.8 cm) Weight: (!) 159.2 kg (351 lb) IBW/kg (Calculated) : 73  Temp (24hrs), Avg:98.7 F (37.1 C), Min:97.8 F (36.6 C), Max:100.3 F (37.9 C)  Recent Labs  Lab 10/14/21 2035 10/15/21 0330 10/16/21 1221 10/17/21 0416 10/18/21 0159 10/19/21 0251 10/20/21 0123  WBC 12.0*   < > 8.6 8.9 8.1 7.5 8.8  CREATININE 1.74*   < > 1.48* 1.30* 1.14 1.17 1.12  LATICACIDVEN 1.4  --   --   --   --   --   --    < > = values in this interval not displayed.     Estimated Creatinine Clearance: 98.6 mL/min (by C-G formula based on SCr of 1.12 mg/dL).    No Known Allergies  [name], PharmD, BCPS Please check AMION for all [place] contact numbers Clinical Pharmacist 10/20/2021 9:49 AM

## 2021-10-20 NOTE — Progress Notes (Signed)
Physical Therapy Treatment Patient Details Name: [name] [name] MRN: [id number] DOB: 1956/01/13 Today's Date: 10/20/2021   History of Present Illness Pt is a 66 y.o. male admitted 10/14/21 with LLE pain; pt reports he turned his L ankle when getting into car 08/2021 and has not been able to bear weight since then. Workup for chronic venous insufficiency and L ankle abscess with osteomyelitis. S/p EGD and colonoscopy on 2/2. Plan for L transtibial amputation 2/3. PMH includes morbid obesity, CHF, HTN, CVA.   PT Comments    Pt slowly progressing with mobility; note plan for L transtibial amputation tomorrow. Pt tolerated prolonged sitting EOB activity with improved stability; having significant difficulty initiating lateral scoot transfers. Pt remains motivated to participate; pt unsure about d/c recommendations for SNF due to previous bad experience with his late wife. Pt will require post-acute rehab to maximize functional mobility and decrease caregiver burden prior to return home. Will continue to follow acutely to address established goals.   Recommendations for follow up therapy are one component of a multi-disciplinary discharge planning process, led by the attending physician.  Recommendations may be updated based on patient status, additional functional criteria and insurance authorization.  Follow Up Recommendations  Skilled nursing-short term rehab (<3 hours/day)     Assistance Recommended at Discharge Frequent or constant Supervision/Assistance  Patient can return home with the following Two people to help with bathing/dressing/bathroom;Two people to help with walking and/or transfers;Help with stairs or ramp for entrance   Equipment Recommendations   TBD - if home, will need bariatric wheelchair, hospital bed, lift equipment   Recommendations for Other Services       Precautions / Restrictions Precautions Precautions: Fall;Other (comment) Precaution Comments: plan for L BKA  2/3 Restrictions Weight Bearing Restrictions: No     Mobility  Bed Mobility Overal bed mobility: Needs Assistance Bed Mobility: Supine to Sit, Sit to Supine     Supine to sit: Mod assist, +2 for safety/equipment Sit to supine: Mod assist, +2 for safety/equipment   General bed mobility comments: ModA for trunk elevation and scooting hips to EOB, heavy reliance on bed rail, +2 assist to prevent posterior LOB; seated stability improved when sizewize mattress deflated    Transfers Overall transfer level: Needs assistance                 General transfer comment: attempted lateral scooting at EOB, pt with significant difficulty despite flat/firm surface with mattress deflated, ultimately unable to scoot - suspect main limiting factor is difficulty flexing bilateral knees/feet underneath him and achiving anterior weight translation    Ambulation/Gait                   Stairs             Wheelchair Mobility    Modified Rankin (Stroke Patients Only)       Balance Overall balance assessment: Needs assistance Sitting-balance support: Feet supported, Bilateral upper extremity supported Sitting balance-Leahy Scale: Fair Sitting balance - Comments: initially with posterior lean and multiple posterior LOB requiring UE support and external assist to prevent fall, progressing to supervision level without UE support Postural control: Posterior lean                                  Cognition Arousal/Alertness: Awake/alert Behavior During Therapy: WFL for tasks assessed/performed, Flat affect Overall Cognitive Status: Within Functional Limits for tasks assessed  Exercises      General Comments        Pertinent Vitals/Pain Pain Assessment Pain Assessment: Faces Faces Pain Scale: Hurts a little bit Pain Location: BLE with movement Pain Descriptors / Indicators: Grimacing,  Guarding Pain Intervention(s): Monitored during session, Limited activity within patient's tolerance    Home Living                          Prior Function            PT Goals (current goals can now be found in the care plan section) Progress towards PT goals: Progressing toward goals    Frequency    Min 2X/week      PT Plan Current plan remains appropriate    Co-evaluation PT/OT/SLP Co-Evaluation/Treatment: Yes Reason for Co-Treatment: For patient/therapist safety;To address functional/ADL transfers PT goals addressed during session: Mobility/safety with mobility;Balance OT goals addressed during session: ADL's and self-care      AM-PAC PT "6 Clicks" Mobility   Outcome Measure  Help needed turning from your back to your side while in a flat bed without using bedrails?: A Lot Help needed moving from lying on your back to sitting on the side of a flat bed without using bedrails?: A Lot Help needed moving to and from a bed to a chair (including a wheelchair)?: Total Help needed standing up from a chair using your arms (e.g., wheelchair or bedside chair)?: Total Help needed to walk in hospital room?: Total Help needed climbing 3-5 steps with a railing? : Total 6 Click Score: 8    End of Session   Activity Tolerance: Patient tolerated treatment well Patient left: in bed;with call bell/phone within reach Nurse Communication: Mobility status;Need for lift equipment PT Visit Diagnosis: Pain;Other abnormalities of gait and mobility (R26.89);Muscle weakness (generalized) (M62.81);Difficulty in walking, not elsewhere classified (R26.2) Pain - Right/Left: Left Pain - part of body: Ankle and joints of foot     Time: 0934-1000 PT Time Calculation (min) (ACUTE ONLY): 26 min  Charges:  $Therapeutic Activity: 8-22 mins                     Mabeline Caras, PT, DPT Acute Rehabilitation Services  Pager [phone] Office [phone]  Derry Lory 10/20/2021,  1:59 PM

## 2021-10-20 NOTE — H&P (View-Only) (Signed)
Patient ID: [name] [name], male   DOB: [birth year], 66 y.o.   MRN: [id number] Patient was in endoscopic procedure yesterday at the proposed time for his amputation surgery.  Plan to proceed with left transtibial amputation on Friday.  Patient has no further questions.

## 2021-10-21 ENCOUNTER — Encounter (HOSPITAL_COMMUNITY)
Admission: EM | Disposition: A | Discharge status: Skilled Nursing Facility | Payer: Self-pay | Source: Home / Self Care | Attending: Family Medicine

## 2021-10-21 ENCOUNTER — Encounter (HOSPITAL_COMMUNITY): Payer: Self-pay | Admitting: Internal Medicine

## 2021-10-21 ENCOUNTER — Other Ambulatory Visit: Payer: Self-pay

## 2021-10-21 ENCOUNTER — Inpatient Hospital Stay (HOSPITAL_COMMUNITY): Payer: Medicare Other | Admitting: Certified Registered"

## 2021-10-21 DIAGNOSIS — M86172 Other acute osteomyelitis, left ankle and foot: Secondary | ICD-10-CM

## 2021-10-21 HISTORY — PX: AMPUTATION: SHX166

## 2021-10-21 LAB — GLUCOSE, CAPILLARY
Glucose-Capillary: 150 mg/dL — ABNORMAL HIGH (ref 70–99)
Glucose-Capillary: 145 mg/dL — ABNORMAL HIGH (ref 70–99)
Glucose-Capillary: 123 mg/dL — ABNORMAL HIGH (ref 70–99)
Glucose-Capillary: 140 mg/dL — ABNORMAL HIGH (ref 70–99)
Glucose-Capillary: 177 mg/dL — ABNORMAL HIGH (ref 70–99)

## 2021-10-21 LAB — PREPARE RBC (CROSSMATCH)

## 2021-10-21 SURGERY — AMPUTATION BELOW KNEE
Anesthesia: Monitor Anesthesia Care | Site: Knee | Laterality: Left

## 2021-10-21 MED ORDER — LABETALOL HCL 5 MG/ML IV SOLN: 10.0000 mg | INTRAVENOUS | Status: DC | PRN

## 2021-10-21 MED ORDER — ASCORBIC ACID 500 MG PO TABS
1000.0000 mg | ORAL_TABLET | Freq: Every day | ORAL | Status: DC
  Administered 2021-10-21 – 2021-10-25 (×5): 1000 mg via ORAL
  Filled 2021-10-21 (×5): qty 2

## 2021-10-21 MED ORDER — MORPHINE SULFATE (PF) 2 MG/ML IV SOLN: 0.5000 mg | INTRAVENOUS | Status: DC | PRN

## 2021-10-21 MED ORDER — ZINC SULFATE 220 (50 ZN) MG PO CAPS
220.0000 mg | ORAL_CAPSULE | Freq: Every day | ORAL | Status: DC
  Administered 2021-10-21 – 2021-10-25 (×5): 220 mg via ORAL
  Filled 2021-10-21 (×5): qty 1

## 2021-10-21 MED ORDER — HYDROCODONE-ACETAMINOPHEN 5-325 MG PO TABS
1.0000 | ORAL_TABLET | ORAL | Status: DC | PRN
  Administered 2021-10-22 – 2021-10-25 (×3): 2 via ORAL
  Filled 2021-10-21 (×3): qty 2

## 2021-10-21 MED ORDER — ALUM & MAG HYDROXIDE-SIMETH 200-200-20 MG/5ML PO SUSP: 15.0000 mL | ORAL | Status: DC | PRN

## 2021-10-21 MED ORDER — GUAIFENESIN-DM 100-10 MG/5ML PO SYRP: 15.0000 mL | ORAL_SOLUTION | ORAL | Status: DC | PRN

## 2021-10-21 MED ORDER — HYDRALAZINE HCL 20 MG/ML IJ SOLN: 5.0000 mg | INTRAMUSCULAR | Status: DC | PRN

## 2021-10-21 MED ORDER — DOCUSATE SODIUM 100 MG PO CAPS: 100.0000 mg | ORAL_CAPSULE | Freq: Every day | ORAL | Status: DC

## 2021-10-21 MED ORDER — KETAMINE HCL 50 MG/5ML IJ SOSY
PREFILLED_SYRINGE | INTRAMUSCULAR | Status: AC
  Filled 2021-10-21: qty 5

## 2021-10-21 MED ORDER — KETAMINE HCL 10 MG/ML IJ SOLN
INTRAMUSCULAR | Status: DC | PRN
  Administered 2021-10-21 (×3): 10 mg via INTRAVENOUS

## 2021-10-21 MED ORDER — TRANEXAMIC ACID-NACL 1000-0.7 MG/100ML-% IV SOLN
1000.0000 mg | INTRAVENOUS | Status: AC
  Administered 2021-10-21: 1000 mg via INTRAVENOUS

## 2021-10-21 MED ORDER — MIDAZOLAM HCL 5 MG/5ML IJ SOLN
INTRAMUSCULAR | Status: DC | PRN
  Administered 2021-10-21: 2 mg via INTRAVENOUS

## 2021-10-21 MED ORDER — TRANEXAMIC ACID-NACL 1000-0.7 MG/100ML-% IV SOLN
INTRAVENOUS | Status: AC
  Filled 2021-10-21: qty 100

## 2021-10-21 MED ORDER — HYDROCODONE-ACETAMINOPHEN 7.5-325 MG PO TABS
1.0000 | ORAL_TABLET | ORAL | Status: DC | PRN
  Administered 2021-10-21 – 2021-10-23 (×4): 2 via ORAL
  Administered 2021-10-23: 1 via ORAL
  Administered 2021-10-24: 2 via ORAL
  Filled 2021-10-21 (×5): qty 2
  Filled 2021-10-21: qty 1

## 2021-10-21 MED ORDER — PROPOFOL 500 MG/50ML IV EMUL
INTRAVENOUS | Status: DC | PRN
  Administered 2021-10-21: 50 ug/kg/min via INTRAVENOUS

## 2021-10-21 MED ORDER — ONDANSETRON HCL 4 MG/2ML IJ SOLN: 4.0000 mg | Freq: Four times a day (QID) | INTRAMUSCULAR | Status: DC | PRN

## 2021-10-21 MED ORDER — 0.9 % SODIUM CHLORIDE (POUR BTL) OPTIME
TOPICAL | Status: DC | PRN
  Administered 2021-10-21: 1000 mL

## 2021-10-21 MED ORDER — BISACODYL 5 MG PO TBEC: 5.0000 mg | DELAYED_RELEASE_TABLET | Freq: Every day | ORAL | Status: DC | PRN

## 2021-10-21 MED ORDER — FENTANYL CITRATE (PF) 100 MCG/2ML IJ SOLN
25.0000 ug | INTRAMUSCULAR | Status: DC | PRN
  Administered 2021-10-21: 25 ug via INTRAVENOUS

## 2021-10-21 MED ORDER — FENTANYL CITRATE (PF) 100 MCG/2ML IJ SOLN
INTRAMUSCULAR | Status: AC
  Filled 2021-10-21: qty 2

## 2021-10-21 MED ORDER — MAGNESIUM SULFATE 2 GM/50ML IV SOLN
2.0000 g | Freq: Every day | INTRAVENOUS | Status: DC | PRN
  Filled 2021-10-21: qty 50

## 2021-10-21 MED ORDER — ACETAMINOPHEN 10 MG/ML IV SOLN
INTRAVENOUS | Status: AC
  Filled 2021-10-21: qty 100

## 2021-10-21 MED ORDER — POTASSIUM CHLORIDE CRYS ER 20 MEQ PO TBCR: 20.0000 meq | EXTENDED_RELEASE_TABLET | Freq: Every day | ORAL | Status: DC | PRN

## 2021-10-21 MED ORDER — LIDOCAINE 2% (20 MG/ML) 5 ML SYRINGE
INTRAMUSCULAR | Status: AC
  Filled 2021-10-21: qty 20

## 2021-10-21 MED ORDER — LACTATED RINGERS IV SOLN: INTRAVENOUS | Status: DC | PRN

## 2021-10-21 MED ORDER — FUROSEMIDE 10 MG/ML IJ SOLN
40.0000 mg | Freq: Once | INTRAMUSCULAR | Status: AC
  Administered 2021-10-21: 40 mg via INTRAVENOUS
  Filled 2021-10-21: qty 4

## 2021-10-21 MED ORDER — ONDANSETRON HCL 4 MG/2ML IJ SOLN
INTRAMUSCULAR | Status: DC | PRN
Start: 2021-10-21 — End: 2021-10-21
  Administered 2021-10-21: 4 mg via INTRAVENOUS

## 2021-10-21 MED ORDER — ROPIVACAINE HCL 5 MG/ML IJ SOLN
INTRAMUSCULAR | Status: DC | PRN
  Administered 2021-10-21: 20 mL via PERINEURAL

## 2021-10-21 MED ORDER — SODIUM CHLORIDE 0.9 % IV SOLN: 10.0000 mL/h | Freq: Once | INTRAVENOUS | Status: AC

## 2021-10-21 MED ORDER — MAGNESIUM CITRATE PO SOLN
1.0000 | Freq: Once | ORAL | Status: DC | PRN
  Filled 2021-10-21: qty 296

## 2021-10-21 MED ORDER — ONDANSETRON HCL 4 MG/2ML IJ SOLN: 4.0000 mg | Freq: Once | INTRAMUSCULAR | Status: DC | PRN

## 2021-10-21 MED ORDER — PHENYLEPHRINE HCL (PRESSORS) 10 MG/ML IV SOLN
INTRAVENOUS | Status: DC | PRN
  Administered 2021-10-21 (×4): 80 ug via INTRAVENOUS

## 2021-10-21 MED ORDER — METOPROLOL TARTRATE 5 MG/5ML IV SOLN: 2.0000 mg | INTRAVENOUS | Status: DC | PRN

## 2021-10-21 MED ORDER — BUPIVACAINE-EPINEPHRINE (PF) 0.5% -1:200000 IJ SOLN
INTRAMUSCULAR | Status: DC | PRN
  Administered 2021-10-21: 30 mL via PERINEURAL

## 2021-10-21 MED ORDER — CHLORHEXIDINE GLUCONATE 0.12 % MT SOLN
OROMUCOSAL | Status: AC
  Filled 2021-10-21: qty 15

## 2021-10-21 MED ORDER — SODIUM CHLORIDE 0.9 % IV SOLN: INTRAVENOUS | Status: DC

## 2021-10-21 MED ORDER — CHLORHEXIDINE GLUCONATE 0.12 % MT SOLN: 15.0000 mL | Freq: Once | OROMUCOSAL | Status: DC

## 2021-10-21 MED ORDER — MIDAZOLAM HCL 2 MG/2ML IJ SOLN
INTRAMUSCULAR | Status: AC
  Filled 2021-10-21: qty 2

## 2021-10-21 MED ORDER — ACETAMINOPHEN 10 MG/ML IV SOLN
1000.0000 mg | Freq: Once | INTRAVENOUS | Status: DC | PRN
  Administered 2021-10-21: 1000 mg via INTRAVENOUS

## 2021-10-21 MED ORDER — ACETAMINOPHEN 325 MG PO TABS: 325.0000 mg | ORAL_TABLET | Freq: Four times a day (QID) | ORAL | Status: DC | PRN

## 2021-10-21 MED ORDER — PHENOL 1.4 % MT LIQD: 1.0000 | OROMUCOSAL | Status: DC | PRN

## 2021-10-21 MED ORDER — JUVEN PO PACK
1.0000 | PACK | Freq: Two times a day (BID) | ORAL | Status: DC
  Filled 2021-10-21: qty 1

## 2021-10-21 MED ORDER — ORAL CARE MOUTH RINSE: 15.0000 mL | Freq: Once | OROMUCOSAL | Status: DC

## 2021-10-21 MED ORDER — LACTATED RINGERS IV SOLN: INTRAVENOUS | Status: DC

## 2021-10-21 MED ORDER — POLYETHYLENE GLYCOL 3350 17 G PO PACK
17.0000 g | PACK | Freq: Every day | ORAL | Status: DC | PRN
  Filled 2021-10-21: qty 1

## 2021-10-21 MED ORDER — PANTOPRAZOLE SODIUM 40 MG PO TBEC: 40.0000 mg | DELAYED_RELEASE_TABLET | Freq: Every day | ORAL | Status: DC

## 2021-10-21 SURGICAL SUPPLY — 38 items
BAG COUNTER SPONGE SURGICOUNT (BAG) IMPLANT
BAG SPNG CNTER NS LX DISP (BAG)
BLADE SAW RECIP 87.9 MT (BLADE) ×2 IMPLANT
BLADE SURG 21 STRL SS (BLADE) ×2 IMPLANT
BNDG COHESIVE 6X5 TAN STRL LF (GAUZE/BANDAGES/DRESSINGS) IMPLANT
CANISTER WOUND CARE 500ML ATS (WOUND CARE) ×2 IMPLANT
COVER SURGICAL LIGHT HANDLE (MISCELLANEOUS) ×2 IMPLANT
CUFF TOURN SGL QUICK 34 (TOURNIQUET CUFF) ×2
CUFF TRNQT CYL 34X4.125X (TOURNIQUET CUFF) ×1 IMPLANT
DRAPE DERMATAC (DRAPES) ×4 IMPLANT
DRAPE INCISE IOBAN 66X45 STRL (DRAPES) ×2 IMPLANT
DRAPE U-SHAPE 47X51 STRL (DRAPES) ×2 IMPLANT
DRESSING PREVENA PLUS CUSTOM (GAUZE/BANDAGES/DRESSINGS) ×1 IMPLANT
DRSG PREVENA PLUS CUSTOM (GAUZE/BANDAGES/DRESSINGS) ×2
DURAPREP 26ML APPLICATOR (WOUND CARE) ×2 IMPLANT
ELECT REM PT RETURN 9FT ADLT (ELECTROSURGICAL) ×2
ELECTRODE REM PT RTRN 9FT ADLT (ELECTROSURGICAL) ×1 IMPLANT
GLOVE SURG ORTHO LTX SZ9 (GLOVE) ×2 IMPLANT
GLOVE SURG UNDER POLY LF SZ9 (GLOVE) ×2 IMPLANT
GOWN STRL REUS W/ TWL XL LVL3 (GOWN DISPOSABLE) ×2 IMPLANT
GOWN STRL REUS W/TWL XL LVL3 (GOWN DISPOSABLE) ×4
KIT BASIN OR (CUSTOM PROCEDURE TRAY) ×2 IMPLANT
KIT TURNOVER KIT B (KITS) ×2 IMPLANT
MANIFOLD NEPTUNE II (INSTRUMENTS) ×2 IMPLANT
NS IRRIG 1000ML POUR BTL (IV SOLUTION) ×2 IMPLANT
PACK ORTHO EXTREMITY (CUSTOM PROCEDURE TRAY) ×2 IMPLANT
PAD ARMBOARD 7.5X6 YLW CONV (MISCELLANEOUS) ×4 IMPLANT
PREVENA RESTOR ARTHOFORM 46X30 (CANNISTER) ×2 IMPLANT
SPONGE T-LAP 18X18 ~~LOC~~+RFID (SPONGE) IMPLANT
STAPLER VISISTAT 35W (STAPLE) IMPLANT
STOCKINETTE IMPERVIOUS LG (DRAPES) ×2 IMPLANT
SUT ETHILON 2 0 PSLX (SUTURE) ×7 IMPLANT
SUT SILK 2 0 (SUTURE) ×2
SUT SILK 2-0 18XBRD TIE 12 (SUTURE) ×1 IMPLANT
SUT VIC AB 1 CTX 27 (SUTURE) ×4 IMPLANT
TOWEL GREEN STERILE (TOWEL DISPOSABLE) ×2 IMPLANT
TUBE CONNECTING 12X1/4 (SUCTIONS) ×2 IMPLANT
YANKAUER SUCT BULB TIP NO VENT (SUCTIONS) ×2 IMPLANT

## 2021-10-21 NOTE — Progress Notes (Signed)
Pt refuses CPAP for the night. RT will monitor as needed.

## 2021-10-21 NOTE — Progress Notes (Signed)
Orthopedic Tech Progress Note Patient Details:  [name] [name] December 08, 1955 [id number] Called [facility] into [facility] Patient ID: [name] [name], male   DOB: 1955/11/30, 66 y.o.   MRN: [id number]  [name] [name] 10/21/2021, 2:48 PM

## 2021-10-21 NOTE — Anesthesia Preprocedure Evaluation (Signed)
Anesthesia Evaluation  Patient identified by MRN, date of birth, ID band Patient awake    Reviewed: Allergy & Precautions, NPO status , Patient's Chart, lab work & pertinent test results  Airway Mallampati: II  TM Distance: >3 FB Neck ROM: Full    Dental no notable dental hx. 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 on medications Normal cardiovascular exam Rhythm:Regular Rate:Normal  /2019 Echo Left ventricle: The cavity size was normal. Wall thickness was  increased in a pattern of mild LVH. Systolic function was normal.  The estimated ejection fraction was in the range of 60% to 65%.  Diastolic function is abnormal, indeterminant grade. Wall motion  was normal; there were no regional wall motion abnormalities.  - Aortic valve: Mildly calcified annulus. Trileaflet; mildly  thickened leaflets. There was mild regurgitation. Valve area  (VTI): 4.71 cm^2. Valve area (Vmax): 3.73 cm^2. Valve area  (Vmean): 4.11 cm^2.  - Left atrium: The atrium was moderately dilated.  - Technically adequate study.    Neuro/Psych CVA (R sided weakness), Residual Symptoms    GI/Hepatic negative GI ROS,   Endo/Other  diabetesMorbid obesity (BMI 50)  Renal/GU Renal diseaseLab Results      Component                Value               Date                      CREATININE               1.12                10/20/2021                BUN                      19                  10/20/2021                NA                       135                 10/20/2021                K                        4.5                 10/20/2021                CL                       101                 10/20/2021                CO2                      25  10/20/2021                Musculoskeletal  (+) Arthritis ,   Abdominal (+) + obese,   Peds  Hematology  (+) Blood dyscrasia, anemia , Lab Results      Component                Value               Date                      WBC                      8.8                 10/20/2021                HGB                      9.0 (L)             10/20/2021                HCT                      29.5 (L)            10/20/2021                MCV                      77.0 (L)            10/20/2021                PLT                      257                 10/20/2021              Anesthesia Other Findings   Reproductive/Obstetrics                             Anesthesia Physical Anesthesia Plan  ASA: 4  Anesthesia Plan: Regional   Post-op Pain Management:    Induction:   PONV Risk Score and Plan: Treatment may vary due to age or medical condition and Midazolam  Airway Management Planned: Nasal Cannula and Natural Airway  Additional Equipment: None  Intra-op Plan:   Post-operative Plan: Extubation in OR  Informed Consent: I have reviewed the patients History and Physical, chart, labs and discussed the procedure including the risks, benefits and alternatives for the proposed anesthesia with the patient or authorized representative who has indicated his/her understanding and acceptance.     Dental advisory given  Plan Discussed with: CRNA and Anesthesiologist  Anesthesia Plan Comments:         Anesthesia Quick Evaluation

## 2021-10-21 NOTE — Anesthesia Procedure Notes (Signed)
Anesthesia Regional Block: Adductor canal block   Pre-Anesthetic Checklist: , timeout performed,  Correct Patient, Correct Site, Correct Laterality,  Correct Procedure, Correct Position, site marked,  Risks and benefits discussed,  Surgical consent,  Pre-op evaluation,  At surgeon's request and post-op pain management  Laterality: Lower and Left  Prep: chloraprep       Needles:  Injection technique: Single-shot  Needle Type: Echogenic Needle     Needle Length: 9cm  Needle Gauge: 22     Additional Needles:   Procedures:,,,, ultrasound used (permanent image in chart),,    Narrative:  Start time: 10/21/2021 2:06 PM End time: 10/21/2021 2:12 PM Injection made incrementally with aspirations every 5 mL.  Performed by: Personally  Anesthesiologist: Barnet Glasgow, MD  Additional Notes: Block assessed prior to surgery. Pt tolerated procedure well.

## 2021-10-21 NOTE — Progress Notes (Signed)
PROGRESS NOTE    [name] [name]  CWC:[id number] DOB: 1956-06-13 DOA: 10/14/2021 PCP: Manon Hilding, MD   Brief Narrative:  [name] [name] is a 66 y.o. male with medical history significant for chronic venous stasis, lymphedema, CVA, asbestosis, hypertension, OSA not on CPAP, who presents with concerns of worsening left ankle pain. 08/27/2021 feels he rolled his ankle when standing. 09/15/2021 seen by orthopedics, x-ray performed.  Diagnosed to have a sprain and ankle 12/28 seen by wound care at which time patient complains that he is barely able to walk. 1/27 presents to ER with worsening left leg pain and redness.  Admitted for cellulitis.  MRI ordered. 1/28 MRI positive for osteomyelitis of the ankle joint as well as hindfoot and septic arthritis.  Orthopedic consulted.   Assessment & Plan:   Principal Problem:   Osteomyelitis of ankle and foot (HCC) Active Problems:   Morbid obesity (HCC)   OSA (obstructive sleep apnea)   Essential hypertension   Chronic diastolic heart failure (HCC)   AKI (acute kidney injury) (HCC)   Anemia   Pressure ulcer   History of CVA (cerebrovascular accident)   Septic arthritis of ankle and foot region [facility])   Left leg pain  Osteomyelitis of left ankle and foot (HCC)/septic arthritis- (present on admission) Has chronic venous stasis as well as severe lymphedema. Left leg has weeping ulcers with purulent and necrotic areas. Started on broad-spectrum IV antibiotics. MRI lower extremity confirms presence of osteomyelitis of the ankle and foot area as well as septic arthritis of the multiple ankle joints.  Seen by orthopedics.  Plan for BKA today.  Continue current antibiotics.  Patient in good spirits.  Upper and lower GI bleeding/gastric erosions/gastritis/ulcer at the ileocecal valve/multiple polyps in the colon: Hemoglobin has remained stable.  S/p EGD and colonoscopy 10/19/2021.  GI recommends continuing PPI 40 mg twice daily for 8 weeks.  Aspirin  and Plavix on hold.  He is cleared from GI to resume aspirin and Plavix however we will resume both of these medications tomorrow since he is going for surgery today.  History of CVA (cerebrovascular accident): Holding aspirin and Plavix for now. - cont statin   Pressure ulcer- (present on admission) Stage II bilateral buttock ulcer, present on admission. Wound care consulted. Low-air-loss mattress requested. Continue dressing changes.   Pressure Injury 10/15/21 Buttocks Right;Left Stage 2 -  Partial thickness loss of dermis presenting as a shallow open injury with a red, pink wound bed without slough. pt with multiple areas of stage ii pressure injury on both sides of the buttocks, also (Active) 10/15/21 1635 Location: Buttocks Location Orientation: Right;Left Staging: Stage 2 -  Partial thickness loss of dermis presenting as a shallow open injury with a red, pink wound bed without slough. Wound Description (Comments): pt with multiple areas of stage ii pressure injury on both sides of the buttocks, also purple discoloration in area as well, sacral foam applied Present on Admission: Yes   AKI (acute kidney injury) ([place])- (present on admission) Baseline serum creatinine around 1.3.  On presentation serum creatinine 1.74.  Received IV fluids.  Now much better than his baseline.   Acute on chronic diastolic heart failure (HCC)/history of asbestosis- Chest x-ray shows vascular congestion but no overt pulmonary edema.  BNP elevated.  He takes Lasix p.o. as needed.  I gave him 1 dose of Lasix IV yesterday.  We will give him 1 more dose today.  He is wheezy on exam but no crackles but again  his body habitus is limiting proper assessment.  He tells me that he has wheezes at baseline at home due to his history of asbestosis.  He does not use any inhalers. Continue Toprol-XL.  Morbid obesity ([place])- (present on admission) Body mass index is 50.22 kg/m.  Placing the patient at high risk for poor  outcome. Not on cpap at home   T2DM Not on meds at home.  Blood sugar controlled on SSI   DVT prophylaxis:    Code Status: Full Code  Family Communication:  None present at bedside.  Plan of care discussed with patient in length and he/she verbalized understanding and agreed with it.  Status is: Inpatient  Remains inpatient appropriate because: Needs BKA.  Will likely need SNF after that.  Estimated body mass index is 50.36 kg/m as calculated from the following:   Height as of this encounter: 5\' 10"  (1.778 m).   Weight as of this encounter: 159.2 kg.  Pressure Injury 10/15/21 Buttocks Right;Left Stage 2 -  Partial thickness loss of dermis presenting as a shallow open injury with a red, pink wound bed without slough. patchy areas of partial thickness skin loss related to moiture associated skin damage (Active)  10/15/21 1635  Location: Buttocks  Location Orientation: Right;Left  Staging: Stage 2 -  Partial thickness loss of dermis presenting as a shallow open injury with a red, pink wound bed without slough.  Wound Description (Comments): patchy areas of partial thickness skin loss related to moiture associated skin damage  Present on Admission: Yes   Nutritional Assessment: Body mass index is 50.36 kg/m.Marland Kitchen Seen by dietician.  I agree with the assessment and plan as outlined below: Nutrition Status:        . Skin Assessment: I have examined the patient's skin and I agree with the wound assessment as performed by the wound care RN as outlined below: Pressure Injury 10/15/21 Buttocks Right;Left Stage 2 -  Partial thickness loss of dermis presenting as a shallow open injury with a red, pink wound bed without slough. patchy areas of partial thickness skin loss related to moiture associated skin damage (Active)  10/15/21 1635  Location: Buttocks  Location Orientation: Right;Left  Staging: Stage 2 -  Partial thickness loss of dermis presenting as a shallow open injury with a red,  pink wound bed without slough.  Wound Description (Comments): patchy areas of partial thickness skin loss related to moiture associated skin damage  Present on Admission: Yes    Consultants:  Orthopedics GI Procedures:  As above  Antimicrobials:  Anti-infectives (From admission, onward)    Start     Dose/Rate Route Frequency Ordered Stop   10/18/21 0330  vancomycin (VANCOCIN) IVPB 1000 mg/200 mL premix        1,000 mg 200 mL/hr over 60 Minutes Intravenous Every 12 hours 10/17/21 1135     10/16/21 1600  vancomycin (VANCOCIN) 2,500 mg in sodium chloride 0.9 % 500 mL IVPB  Status:  Discontinued        2,500 mg 250 mL/hr over 120 Minutes Intravenous Every 36 hours 10/15/21 0327 10/17/21 1135   10/16/21 1030  metroNIDAZOLE (FLAGYL) IVPB 500 mg  Status:  Discontinued        500 mg 100 mL/hr over 60 Minutes Intravenous Every 12 hours 10/16/21 0936 10/17/21 1314   10/15/21 1000  ceFEPIme (MAXIPIME) 2 g in sodium chloride 0.9 % 100 mL IVPB        2 g 200 mL/hr over 30 Minutes Intravenous Every  8 hours 10/15/21 0327     10/15/21 0130  vancomycin (VANCOCIN) IVPB 1000 mg/200 mL premix  Status:  Discontinued        1,000 mg 200 mL/hr over 60 Minutes Intravenous  Once 10/15/21 0117 10/15/21 0129   10/15/21 0130  ceFEPIme (MAXIPIME) 2 g in sodium chloride 0.9 % 100 mL IVPB        2 g 200 mL/hr over 30 Minutes Intravenous  Once 10/15/21 0117 10/15/21 0918   10/15/21 0130  vancomycin (VANCOREADY) IVPB 2000 mg/400 mL        2,000 mg 200 mL/hr over 120 Minutes Intravenous  Once 10/15/21 0129 10/15/21 0918         Subjective: Patient seen and examined.  He has no complaints.  He is in good spirits for the surgery today.  Objective: Vitals:   10/20/21 1737 10/20/21 2212 10/21/21 0500 10/21/21 0803  BP: 131/76 (!) 108/49 124/74 124/78  Pulse: 70 73 66 71  Resp: 18 20 20 18   Temp: 98.3 F (36.8 C) 98.1 F (36.7 C) 97.9 F (36.6 C) 98.3 F (36.8 C)  TempSrc: Oral Oral Oral Oral  SpO2:  93% 93% 92% 93%  Weight:      Height:        Intake/Output Summary (Last 24 hours) at 10/21/2021 1045 Last data filed at 10/21/2021 0500 Gross per 24 hour  Intake 240 ml  Output 3450 ml  Net -3210 ml    Filed Weights   10/14/21 1821 10/15/21 2159  Weight: (!) 158.8 kg (!) 159.2 kg    Examination:  General exam: Appears calm and comfortable, morbidly obese Respiratory system: Clear to auscultation with faint end expiratory wheezes. Respiratory effort normal. Cardiovascular system: S1 & S2 heard, RRR. No JVD, murmurs, rubs, gallops or clicks.  Bilateral lymphedema. Gastrointestinal system: Abdomen is nondistended, soft and nontender. No organomegaly or masses felt. Normal bowel sounds heard. Central nervous system: Alert and oriented. No focal neurological deficits. Extremities: Dressing in lower extremities. Psychiatry: Judgement and insight appear normal. Mood & affect appropriate.   Data Reviewed: I have personally reviewed following labs and imaging studies  CBC: Recent Labs  Lab 10/14/21 2035 10/15/21 0330 10/16/21 1221 10/17/21 0416 10/18/21 0159 10/19/21 0251 10/20/21 0123  WBC 12.0*   < > 8.6 8.9 8.1 7.5 8.8  NEUTROABS 9.6*  --   --   --   --   --   --   HGB 9.4*   < > 8.1* 8.6* 8.4* 9.0* 9.0*  HCT 32.6*   < > 26.9* 28.3* 27.9* 29.3* 29.5*  MCV 79.1*   < > 79.1* 79.1* 78.2* 78.8* 77.0*  PLT 305   < > 227 235 225 217 257   < > = values in this interval not displayed.    Basic Metabolic Panel: Recent Labs  Lab 10/16/21 1221 10/17/21 0416 10/18/21 0159 10/19/21 0251 10/20/21 0123  NA 136 136 135 137 135  K 4.3 4.3 4.5 4.4 4.5  CL 104 103 103 104 101  CO2 24 24 22 24 25   GLUCOSE 196* 139* 162* 143* 167*  BUN 42* 37* 31* 23 19  CREATININE 1.48* 1.30* 1.14 1.17 1.12  CALCIUM 8.7* 8.6* 8.6* 8.8* 8.8*    GFR: Estimated Creatinine Clearance: 98.6 mL/min (by C-G formula based on SCr of 1.12 mg/dL). Liver Function Tests: Recent Labs  Lab 10/14/21 2035   AST 11*  ALT 14  ALKPHOS 51  BILITOT 0.4  PROT 7.1  ALBUMIN 2.5*  No results for input(s): LIPASE, AMYLASE in the last 168 hours. No results for input(s): AMMONIA in the last 168 hours. Coagulation Profile: No results for input(s): INR, PROTIME in the last 168 hours. Cardiac Enzymes: No results for input(s): CKTOTAL, CKMB, CKMBINDEX, TROPONINI in the last 168 hours. BNP (last 3 results) No results for input(s): PROBNP in the last 8760 hours. HbA1C: No results for input(s): HGBA1C in the last 72 hours.  CBG: Recent Labs  Lab 10/20/21 0834 10/20/21 1214 10/20/21 1738 10/20/21 2208 10/21/21 0805  GLUCAP 190* 178* 169* 160* 150*    Lipid Profile: No results for input(s): CHOL, HDL, LDLCALC, TRIG, CHOLHDL, LDLDIRECT in the last 72 hours. Thyroid Function Tests: No results for input(s): TSH, T4TOTAL, FREET4, T3FREE, THYROIDAB in the last 72 hours. Anemia Panel: No results for input(s): VITAMINB12, FOLATE, FERRITIN, TIBC, IRON, RETICCTPCT in the last 72 hours.  Sepsis Labs: Recent Labs  Lab 10/14/21 2035  LATICACIDVEN 1.4     Recent Results (from the past 240 hour(s))  Resp Panel by RT-PCR (Flu A&B, Covid) Nasopharyngeal Swab     Status: None   Collection Time: 10/14/21 11:36 PM   Specimen: Nasopharyngeal Swab; Nasopharyngeal(NP) swabs in vial transport medium  Result Value Ref Range Status   SARS Coronavirus 2 by RT PCR NEGATIVE NEGATIVE Final    Comment: (NOTE) SARS-CoV-2 target nucleic acids are NOT DETECTED.  The SARS-CoV-2 RNA is generally detectable in upper respiratory specimens during the acute phase of infection. The lowest concentration of SARS-CoV-2 viral copies this assay can detect is 138 copies/mL. A negative result does not preclude SARS-Cov-2 infection and should not be used as the sole basis for treatment or other patient management decisions. A negative result may occur with  improper specimen collection/handling, submission of specimen  other than nasopharyngeal swab, presence of viral mutation(s) within the areas targeted by this assay, and inadequate number of viral copies(<138 copies/mL). A negative result must be combined with clinical observations, patient history, and epidemiological information. The expected result is Negative.  Fact Sheet for Patients:  EntrepreneurPulse.com.au  Fact Sheet for Healthcare Providers:  IncredibleEmployment.be  This test is no t yet approved or cleared by the [place] FDA and  has been authorized for detection and/or diagnosis of SARS-CoV-2 by FDA under an Emergency Use Authorization (EUA). This EUA will remain  in effect (meaning this test can be used) for the duration of the COVID-19 declaration under Section 564(b)(1) of the Act, 21 U.S.C.section 360bbb-3(b)(1), unless the authorization is terminated  or revoked sooner.       Influenza A by PCR NEGATIVE NEGATIVE Final   Influenza B by PCR NEGATIVE NEGATIVE Final    Comment: (NOTE) The Xpert Xpress SARS-CoV-2/FLU/RSV plus assay is intended as an aid in the diagnosis of influenza from Nasopharyngeal swab specimens and should not be used as a sole basis for treatment. Nasal washings and aspirates are unacceptable for Xpert Xpress SARS-CoV-2/FLU/RSV testing.  Fact Sheet for Patients: EntrepreneurPulse.com.au  Fact Sheet for Healthcare Providers: IncredibleEmployment.be  This test is not yet approved or cleared by the [place] FDA and has been authorized for detection and/or diagnosis of SARS-CoV-2 by FDA under an Emergency Use Authorization (EUA). This EUA will remain in effect (meaning this test can be used) for the duration of the COVID-19 declaration under Section 564(b)(1) of the Act, 21 U.S.C. section 360bbb-3(b)(1), unless the authorization is terminated or revoked.  Performed at [facility], [place] [address], [place], [place] [address]  Surgical pcr screen     Status: Abnormal   Collection Time: 10/18/21  7:55 PM   Specimen: Nasal Mucosa; Nasal Swab  Result Value Ref Range Status   MRSA, PCR NEGATIVE NEGATIVE Final   Staphylococcus aureus POSITIVE (A) NEGATIVE Final    Comment: (NOTE) The Xpert SA Assay (FDA approved for NASAL specimens in patients 29 years of age and older), is one component of a comprehensive surveillance program. It is not intended to diagnose infection nor to guide or monitor treatment. Performed at [facility], [place] [address] [place], [place] [address]       Radiology Studies: DG CHEST PORT 1 VIEW  Result Date: 10/20/2021 CLINICAL DATA:  Shortness of breath EXAM: PORTABLE CHEST 1 VIEW COMPARISON:  Previous studies including the examination of 10/15/2018 FINDINGS: Transverse diameter of heart is increased. Central pulmonary vessels are prominent without signs of alveolar pulmonary edema. Evaluation of lower lung fields is limited by patient's body habitus. There is increased density in the left lower lung fields with interval improvement. There is blunting of left lateral CP angle. There is no pneumothorax. IMPRESSION: Cardiomegaly. Central pulmonary vessels are prominent without signs of pulmonary edema. Increased density in the left lower lung fields may be due to pleural effusion and possibly underlying infiltrate. There is interval improvement in aeration of left lower lung fields. Electronically Signed   By: Elmer Picker M.D.   On: 10/20/2021 09:35    Scheduled Meds:  sodium chloride   Intravenous Once   sodium chloride   Intravenous Once   allopurinol  300 mg Oral Daily   atorvastatin  10 mg Oral Daily   Chlorhexidine Gluconate Cloth  6 each Topical Q0600   docusate sodium  100 mg Oral BID   hydrALAZINE  50 mg Oral BID   insulin aspart  0-15 Units Subcutaneous TID WC   insulin glargine-yfgn  10 Units Subcutaneous Daily   metoprolol succinate   100 mg Oral Daily   mupirocin ointment  1 application Nasal BID   pantoprazole  40 mg Oral BID   polyethylene glycol  17 g Oral BID   povidone-iodine  2 application Topical Once   Continuous Infusions:  ceFEPime (MAXIPIME) IV 2 g (10/21/21 0240)   vancomycin 1,000 mg (10/21/21 0559)     LOS: 6 days   Time spent: 25 minutes  Darliss Cheney, MD [facility]  10/21/2021, 10:45 AM  Please page via [place] and do not message via secure chat for urgent patient care matters. Secure chat can be used for non urgent patient care matters.  How to contact the [facility] Attending or Consulting provider [place] or covering provider during after hours [place], for this patient?  Check the care team in [facility] and look for a) attending/consulting [facility] provider listed and b) the [facility] team listed. Page or secure chat [phone]. Log into [email or web address] and use [place]'s universal password to access. If you do not have the password, please contact the hospital operator. Locate the [facility] provider you are looking for under [facility] and page to a number that you can be directly reached. If you still have difficulty reaching the provider, please page the [facility] (Director on Call) for the Hospitalists listed on amion for assistance.

## 2021-10-21 NOTE — Anesthesia Procedure Notes (Signed)
Anesthesia Regional Block: Popliteal block   Pre-Anesthetic Checklist: , timeout performed,  Correct Patient, Correct Site, Correct Laterality,  Correct Procedure, Correct Position, site marked,  Risks and benefits discussed,  Pre-op evaluation,  At surgeon's request and post-op pain management  Laterality: Lower and Left  Prep: Maximum Sterile Barrier Precautions used, chloraprep       Needles:  Injection technique: Single-shot  Needle Type: Echogenic Needle     Needle Length: 9cm  Needle Gauge: 21     Additional Needles:   Procedures:,,,, ultrasound used (permanent image in chart),,    Narrative:  Start time: 10/21/2021 1:58 PM End time: 10/21/2021 2:06 PM Injection made incrementally with aspirations every 5 mL.  Performed by: Personally  Anesthesiologist: Barnet Glasgow, MD  Additional Notes: Block assessed. Patient tolerated procedure well.

## 2021-10-21 NOTE — Interval H&P Note (Signed)
History and Physical Interval Note:  10/21/2021 6:59 AM  [name] [name]  has presented today for surgery, with the diagnosis of Osteomyelitis Left Foot.  The various methods of treatment have been discussed with the patient and family. After consideration of risks, benefits and other options for treatment, the patient has consented to  Procedure(s): LEFT BELOW KNEE AMPUTATION (Left) as a surgical intervention.  The patient's history has been reviewed, patient examined, no change in status, stable for surgery.  I have reviewed the patient's chart and labs.  Questions were answered to the patient's satisfaction.     [name]

## 2021-10-21 NOTE — Transfer of Care (Signed)
Immediate Anesthesia Transfer of Care Note  Patient: [name] [name]  Procedure(s) Performed: LEFT BELOW KNEE AMPUTATION (Left: Knee)  Patient Location: PACU  Anesthesia Type:MAC combined with regional for post-op pain  Level of Consciousness: drowsy  Airway & Oxygen Therapy: Patient Spontanous Breathing and Patient connected to face mask oxygen  Post-op Assessment: Report given to RN and Post -op Vital signs reviewed and stable  Post vital signs: Reviewed and stable  Last Vitals:  Vitals Value Taken Time  BP 97/61 10/21/21 1543  Temp    Pulse 61 10/21/21 1547  Resp 24 10/21/21 1547  SpO2 98 % 10/21/21 1547  Vitals shown include unvalidated device data.  Last Pain:  Vitals:   10/21/21 0849  TempSrc:   PainSc: 0-No pain      Patients Stated Pain Goal: 3 (54/56/25 6389)  Complications: No notable events documented.

## 2021-10-21 NOTE — TOC Progression Note (Signed)
Transition of Care [facility]) - Progression Note    Patient Details  Name: [name] [name] MRN: [id number] Date of Birth: 25-Nov-1955  [facility] CM/SW [place], [place] Phone Number: 10/21/2021, 4:39 PM  Clinical Narrative:   CSW met with patient and family at bedside to discuss SNF offers. Patient and family are still reviewing. CSW to have weekend coverage check with him to see if any choices have been made. CSW to follow.    Expected Discharge Plan: [place] Barriers to Discharge: Continued Medical Work up  Expected Discharge Plan and Services Expected Discharge Plan: [place] arrangements for the past 2 months: Single Family Home                                       Social Determinants of Health (SDOH) Interventions    Readmission Risk Interventions No flowsheet data found.

## 2021-10-21 NOTE — Anesthesia Procedure Notes (Signed)
Procedure Name: MAC Date/Time: 10/21/2021 2:45 PM Performed by: Gwyndolyn Saxon, CRNA Oxygen Delivery Method: Simple face mask

## 2021-10-21 NOTE — Op Note (Signed)
° °  Date of Surgery: 10/21/2021  INDICATIONS: Mr. Tavano is a 66 y.o.-year-old male who has chronic osteomyelitis of the left ankle with venous insufficiency both lower extremities with massive circumferential venous insufficiency ulcers to both lower extremities.  Due to the abscess and osteomyelitis of the left ankle patient presents at this time for left transtibial amputation.Marland Kitchen  PREOPERATIVE DIAGNOSIS: Abscess osteomyelitis left ankle  POSTOPERATIVE DIAGNOSIS: Same.  PROCEDURE: Transtibial amputation Application of Prevena wound VAC  SURGEON: Sharol Given, M.D.  ANESTHESIA:  general  IV FLUIDS AND URINE: See anesthesia records.  ESTIMATED BLOOD LOSS: See anesthesia records.  COMPLICATIONS: None.  DESCRIPTION OF PROCEDURE: The patient was brought to the operating room after undergoing regional anesthetic. After adequate levels of anesthesia were obtained patient's lower extremity was prepped using DuraPrep draped into a sterile field. A timeout was called. The foot was draped out of the sterile field with impervious stockinette. A transverse incision was made 11 cm distal to the tibial tubercle. This curved proximally and a large posterior flap was created. The tibia was transected 1 cm proximal to the skin incision. The fibula was transected just proximal to the tibial incision. The tibia was beveled anteriorly. A large posterior flap was created. The sciatic nerve was pulled cut and allowed to retract. The vascular bundles were suture ligated with 2-0 silk. The deep and superficial fascial layers were closed using #1 Vicryl. The skin was closed using staples and 2-0 nylon. The wound was covered with a Prevena customizable and arthroform wound VAC.  The dressing was sealed with dermatac there was a good suction fit. A prosthetic shrinker and limb protector were applied. Patient was taken to the PACU in stable condition.   DISCHARGE PLANNING:  Antibiotic duration: 24 hours  Weightbearing:  Nonweightbearing on the operative extremity  Pain medication: Opioid pathway  Dressing care/ Wound VAC: Continue wound VAC for 1 week after discharge  Discharge to: Discharge planning based on therapy's recommendations for possible inpatient rehabilitation, outpatient rehabilitation, or discharge to home with therapy  Follow-up: In the office 1 week post operative.  Meridee Score, MD [place] 3:35 PM

## 2021-10-22 LAB — CBC
HCT: 29.5 % — ABNORMAL LOW (ref 39.0–52.0)
Hemoglobin: 9 g/dL — ABNORMAL LOW (ref 13.0–17.0)
MCH: 24.3 pg — ABNORMAL LOW (ref 26.0–34.0)
MCHC: 30.5 g/dL (ref 30.0–36.0)
MCV: 79.7 fL — ABNORMAL LOW (ref 80.0–100.0)
Platelets: 268 10*3/uL (ref 150–400)
RBC: 3.7 MIL/uL — ABNORMAL LOW (ref 4.22–5.81)
RDW: 19.4 % — ABNORMAL HIGH (ref 11.5–15.5)
WBC: 16.8 10*3/uL — ABNORMAL HIGH (ref 4.0–10.5)
nRBC: 0.1 % (ref 0.0–0.2)

## 2021-10-22 LAB — BASIC METABOLIC PANEL
Anion gap: 10 (ref 5–15)
BUN: 26 mg/dL — ABNORMAL HIGH (ref 8–23)
CO2: 25 mmol/L (ref 22–32)
Calcium: 8.2 mg/dL — ABNORMAL LOW (ref 8.9–10.3)
Chloride: 98 mmol/L (ref 98–111)
Creatinine, Ser: 1.55 mg/dL — ABNORMAL HIGH (ref 0.61–1.24)
GFR, Estimated: 49 mL/min — ABNORMAL LOW (ref >60)
Glucose, Bld: 178 mg/dL — ABNORMAL HIGH (ref 70–99)
Potassium: 4.6 mmol/L (ref 3.5–5.1)
Sodium: 133 mmol/L — ABNORMAL LOW (ref 135–145)

## 2021-10-22 LAB — GLUCOSE, CAPILLARY
Glucose-Capillary: 145 mg/dL — ABNORMAL HIGH (ref 70–99)
Glucose-Capillary: 158 mg/dL — ABNORMAL HIGH (ref 70–99)
Glucose-Capillary: 175 mg/dL — ABNORMAL HIGH (ref 70–99)
Glucose-Capillary: 176 mg/dL — ABNORMAL HIGH (ref 70–99)

## 2021-10-22 MED ORDER — CLOPIDOGREL BISULFATE 75 MG PO TABS
75.0000 mg | ORAL_TABLET | Freq: Every day | ORAL | Status: DC
Start: 2021-10-22 — End: 2021-10-25
  Administered 2021-10-22 – 2021-10-25 (×4): 75 mg via ORAL
  Filled 2021-10-22 (×4): qty 1

## 2021-10-22 MED ORDER — ASPIRIN EC 81 MG PO TBEC
81.0000 mg | DELAYED_RELEASE_TABLET | Freq: Every day | ORAL | Status: DC
  Administered 2021-10-22 – 2021-10-25 (×4): 81 mg via ORAL
  Filled 2021-10-22 (×4): qty 1

## 2021-10-22 NOTE — Progress Notes (Signed)
Occupational Therapy Treatment Patient Details Name: [name] [name] MRN: [id number] DOB: 1955/10/23 Today's Date: 10/22/2021   History of present illness Pt is a 66 y.o. male admitted 10/14/21 with LLE pain; pt reports he turned his L ankle when getting into car 08/2021 and has not been able to bear weight since then. Workup for chronic venous insufficiency and L ankle abscess with osteomyelitis. S/p EGD and colonoscopy on 2/2. Plan for L BKA 2/3. PMH includes morbid obesity, CHF, HTN, CVA.   OT comments  Pt s/p L BKA, seen today for an OT Re-Evaluation. Pt requiring mod A +2 for bed mobility and min-max/total A +1-2 for all ADL's. Pain is moderate and pt is tolerating it well. Pt very motivated to sit up and work on exercises. Recommending AIR as pt is motivated and will benefit from intensive therapy to maximize his independence and reduce caregiver burden. OT will follow acutely.    Recommendations for follow up therapy are one component of a multi-disciplinary discharge planning process, led by the attending physician.  Recommendations may be updated based on patient status, additional functional criteria and insurance authorization.    Follow Up Recommendations  Acute inpatient rehab (3hours/day)    Assistance Recommended at Discharge Frequent or constant Supervision/Assistance  Patient can return home with the following  A lot of help with bathing/dressing/bathroom;Two people to help with walking and/or transfers;Assistance with cooking/housework;Help with stairs or ramp for entrance;Assist for transportation   Equipment Recommendations  Wheelchair (measurements OT);Wheelchair cushion (measurements OT)    Recommendations for Other Services Rehab consult    Precautions / Restrictions Precautions Precautions: Fall;Other (comment) Precaution Comments: new L BKA Restrictions Weight Bearing Restrictions: Yes LLE Weight Bearing: Non weight bearing       Mobility Bed Mobility Overal  bed mobility: Needs Assistance Bed Mobility: Supine to Sit, Sit to Supine     Supine to sit: Mod assist, +2 for physical assistance, +2 for safety/equipment Sit to supine: Mod assist, +2 for physical assistance, +2 for safety/equipment   General bed mobility comments: Mod A +2 to pull to sitting and bring BLE off the bed    Transfers                   General transfer comment: Deferred due to pt weakness and fatigue     Balance Overall balance assessment: Needs assistance Sitting-balance support: Feet supported, Bilateral upper extremity supported (RLE) Sitting balance-Leahy Scale: Poor Sitting balance - Comments: initially required mod assist to maintain but began to flex into it to use UE's and support Postural control: Posterior lean                                 ADL either performed or assessed with clinical judgement   ADL Overall ADL's : Needs assistance/impaired Eating/Feeding: Set up;Sitting   Grooming: Set up;Sitting   Upper Body Bathing: Moderate assistance;Sitting   Lower Body Bathing: Maximal assistance;+2 for safety/equipment;+2 for physical assistance;Bed level   Upper Body Dressing : Minimal assistance;Sitting   Lower Body Dressing: Total assistance;Bed level   Toilet Transfer: Total assistance;+2 for physical assistance;+2 for safety/equipment   Toileting- Clothing Manipulation and Hygiene: Total assistance;+2 for physical assistance;+2 for safety/equipment;Bed level         General ADL Comments: Pt re-eval s/p BKA this session, continues to require increased assist for all ADL's, unable to stand this session due to weakness and pain  Extremity/Trunk Assessment Upper Extremity Assessment Upper Extremity Assessment: RUE deficits/detail RUE Deficits / Details: hx of CVA and residual weakness, unable to lift UE beyond 90*, PROM WFL RUE Sensation: decreased light touch RUE Coordination: decreased fine motor;decreased gross  motor   Lower Extremity Assessment Lower Extremity Assessment: Defer to PT evaluation        Vision   Vision Assessment?: No apparent visual deficits   Perception Perception Perception: Not tested   Praxis Praxis Praxis: Intact    Cognition Arousal/Alertness: Awake/alert Behavior During Therapy: WFL for tasks assessed/performed, Flat affect Overall Cognitive Status: Within Functional Limits for tasks assessed                                          Exercises      Shoulder Instructions       General Comments VSS on RA, wound vac inplace with drainage.    Pertinent Vitals/ Pain       Pain Assessment Pain Assessment: Faces Faces Pain Scale: Hurts little more Pain Location: LLE to try on the limb protector Pain Descriptors / Indicators: Guarding Pain Intervention(s): Limited activity within patient's tolerance, Monitored during session, Repositioned  Home Living                                          Prior Functioning/Environment              Frequency  Min 2X/week        Progress Toward Goals  OT Goals(current goals can now be found in the care plan section)  Progress towards OT goals: Progressing toward goals  Acute Rehab OT Goals Patient Stated Goal: To get stronger OT Goal Formulation: With patient Time For Goal Achievement: 11/05/21 Potential to Achieve Goals: Good ADL Goals Pt Will Perform Lower Body Bathing: with mod assist;sitting/lateral leans Pt Will Transfer to Toilet: with mod assist;with +2 assist;squat pivot transfer;stand pivot transfer;bedside commode Pt/caregiver will Perform Home Exercise Program: Increased strength;Both right and left upper extremity;With theraband;Independently;With written HEP provided Additional ADL Goal #1: Pt to demo bed mobility at Supervision level in prep for ADL transfers  Plan Discharge plan remains appropriate;Frequency remains appropriate    Co-evaluation                  AM-PAC OT "6 Clicks" Daily Activity     Outcome Measure   Help from another person eating meals?: A Little Help from another person taking care of personal grooming?: A Little Help from another person toileting, which includes using toliet, bedpan, or urinal?: Total Help from another person bathing (including washing, rinsing, drying)?: A Lot Help from another person to put on and taking off regular upper body clothing?: A Little Help from another person to put on and taking off regular lower body clothing?: Total 6 Click Score: 13    End of Session    OT Visit Diagnosis: Unsteadiness on feet (R26.81);Muscle weakness (generalized) (M62.81)   Activity Tolerance Patient tolerated treatment well   Patient Left in bed;with call bell/phone within reach   Nurse Communication Mobility status        Time: 2263-3354 OT Time Calculation (min): 38 min  Charges: OT General Charges $OT Visit: 1 Visit OT Evaluation $OT Re-eval: 1 Re-eval OT Treatments $Self Care/Home Management :  8-22 mins $Therapeutic Activity: 8-22 mins  Clary Boulais H., OTR/L Acute Rehabilitation  [place] Dimaria Elane Yolanda Bonine 10/22/2021, 5:46 PM

## 2021-10-22 NOTE — Progress Notes (Addendum)
PROGRESS NOTE    [name] [name]  WEX:[id number] DOB: 01-23-1956 DOA: 10/14/2021 PCP: Manon Hilding, MD   Brief Narrative:  [name] [name] is a 66 y.o. male with medical history significant for chronic venous stasis, lymphedema, CVA, asbestosis, hypertension, OSA not on CPAP, who presents with concerns of worsening left ankle pain. 08/27/2021 feels he rolled his ankle when standing. 09/15/2021 seen by orthopedics, x-ray performed.  Diagnosed to have a sprain and ankle 12/28 seen by wound care at which time patient complains that he is barely able to walk. 1/27 presents to ER with worsening left leg pain and redness.  Admitted for cellulitis.  MRI ordered. 1/28 MRI positive for osteomyelitis of the ankle joint as well as hindfoot and septic arthritis.  Orthopedic consulted.   Assessment & Plan:   Principal Problem:   Osteomyelitis of ankle and foot (HCC) Active Problems:   Morbid obesity (HCC)   OSA (obstructive sleep apnea)   Essential hypertension   Chronic diastolic heart failure (HCC)   AKI (acute kidney injury) (HCC)   Anemia   Pressure ulcer   History of CVA (cerebrovascular accident)   Septic arthritis of ankle and foot region [facility])   Left leg pain  Osteomyelitis of left ankle and foot (HCC)/septic arthritis- (present on admission) Has chronic venous stasis as well as severe lymphedema. Left leg has weeping ulcers with purulent and necrotic areas. Started on broad-spectrum IV antibiotics. MRI lower extremity confirms presence of osteomyelitis of the ankle and foot area as well as septic arthritis of the multiple ankle joints.  Seen by orthopedics.  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  Management per orthopedics.  Upper and lower GI bleeding/gastric erosions/gastritis/ulcer at the ileocecal valve/multiple polyps in the colon: Hemoglobin has remained stable.  S/p EGD and colonoscopy 10/19/2021.  GI recommends continuing PPI 40  mg twice daily for 8 weeks. He is cleared from GI to resume aspirin and Plavix and now that he has had his surgery, I will resume both of them.  However I will lower his aspirin dose to 81 mg.  History of CVA (cerebrovascular accident): Resume aspirin and Plavix. - cont statin   Pressure ulcer- (present on admission) Stage II bilateral buttock ulcer, present on admission. Wound care consulted. Low-air-loss mattress requested. Continue dressing changes.   Pressure Injury 10/15/21 Buttocks Right;Left Stage 2 -  Partial thickness loss of dermis presenting as a shallow open injury with a red, pink wound bed without slough. pt with multiple areas of stage ii pressure injury on both sides of the buttocks, also (Active) 10/15/21 1635 Location: Buttocks Location Orientation: Right;Left Staging: Stage 2 -  Partial thickness loss of dermis presenting as a shallow open injury with a red, pink wound bed without slough. Wound Description (Comments): pt with multiple areas of stage ii pressure injury on both sides of the buttocks, also purple discoloration in area as well, sacral foam applied Present on Admission: Yes   AKI (acute kidney injury) ([place])- (present on admission) Baseline serum creatinine around 1.3.  On presentation serum creatinine 1.74.  Received IV fluids.  Now much better than his baseline.   Acute on chronic diastolic heart failure (HCC)/history of asbestosis-much better now.  No more wheezes, no crackles.  Not feeling short of breath.  Will assess daily for need of diuretics.  Continue Toprol-XL.  Morbid obesity ([place])- (present on admission) Body mass index is 50.22 kg/m.  Placing the patient at high risk  for poor outcome. Not on cpap at home  Hypertension patient on hydralazine 50 mg p.o. twice daily and Toprol-XL.  Blood pressure on the lower side but patient has no symptoms.  We will place higher holding parameters to both medications.  Monitor.   T2DM Not on meds at home.   Blood sugar controlled on SSI   DVT prophylaxis: SCD's Start: 10/21/21 1752   Code Status: Full Code  Family Communication:  None present at bedside.  Plan of care discussed with patient in length and he/she verbalized understanding and agreed with it.  Status is: Inpatient  Remains inpatient appropriate because: Needs BKA.  Will likely need SNF after that.  Estimated body mass index is 50.36 kg/m as calculated from the following:   Height as of this encounter: 5\' 10"  (1.778 m).   Weight as of this encounter: 159.2 kg.  Pressure Injury 10/15/21 Buttocks Right;Left Stage 2 -  Partial thickness loss of dermis presenting as a shallow open injury with a red, pink wound bed without slough. patchy areas of partial thickness skin loss related to moiture associated skin damage (Active)  10/15/21 1635  Location: Buttocks  Location Orientation: Right;Left  Staging: Stage 2 -  Partial thickness loss of dermis presenting as a shallow open injury with a red, pink wound bed without slough.  Wound Description (Comments): patchy areas of partial thickness skin loss related to moiture associated skin damage  Present on Admission: Yes   Nutritional Assessment: Body mass index is 50.36 kg/m.Marland Kitchen Seen by dietician.  I agree with the assessment and plan as outlined below: Nutrition Status:  . Skin Assessment: I have examined the patient's skin and I agree with the wound assessment as performed by the wound care RN as outlined below: Pressure Injury 10/15/21 Buttocks Right;Left Stage 2 -  Partial thickness loss of dermis presenting as a shallow open injury with a red, pink wound bed without slough. patchy areas of partial thickness skin loss related to moiture associated skin damage (Active)  10/15/21 1635  Location: Buttocks  Location Orientation: Right;Left  Staging: Stage 2 -  Partial thickness loss of dermis presenting as a shallow open injury with a red, pink wound bed without slough.  Wound  Description (Comments): patchy areas of partial thickness skin loss related to moiture associated skin damage  Present on Admission: Yes    Consultants:  Orthopedics GI Procedures:  As above  Antimicrobials:  Anti-infectives (From admission, onward)    Start     Dose/Rate Route Frequency Ordered Stop   10/18/21 0330  vancomycin (VANCOCIN) IVPB 1000 mg/200 mL premix        1,000 mg 200 mL/hr over 60 Minutes Intravenous Every 12 hours 10/17/21 1135     10/16/21 1600  vancomycin (VANCOCIN) 2,500 mg in sodium chloride 0.9 % 500 mL IVPB  Status:  Discontinued        2,500 mg 250 mL/hr over 120 Minutes Intravenous Every 36 hours 10/15/21 0327 10/17/21 1135   10/16/21 1030  metroNIDAZOLE (FLAGYL) IVPB 500 mg  Status:  Discontinued        500 mg 100 mL/hr over 60 Minutes Intravenous Every 12 hours 10/16/21 0936 10/17/21 1314   10/15/21 1000  ceFEPIme (MAXIPIME) 2 g in sodium chloride 0.9 % 100 mL IVPB        2 g 200 mL/hr over 30 Minutes Intravenous Every 8 hours 10/15/21 0327     10/15/21 0130  vancomycin (VANCOCIN) IVPB 1000 mg/200 mL premix  Status:  Discontinued        1,000 mg 200 mL/hr over 60 Minutes Intravenous  Once 10/15/21 0117 10/15/21 0129   10/15/21 0130  ceFEPIme (MAXIPIME) 2 g in sodium chloride 0.9 % 100 mL IVPB        2 g 200 mL/hr over 30 Minutes Intravenous  Once 10/15/21 0117 10/15/21 0918   10/15/21 0130  vancomycin (VANCOREADY) IVPB 2000 mg/400 mL        2,000 mg 200 mL/hr over 120 Minutes Intravenous  Once 10/15/21 0129 10/15/21 0918         Subjective: Patient seen and examined.  He has no complaints.  His fiance at bedside had several questions which were answered.  She was upset that she never received a phone call from GI about the results of the EGD and colonoscopy.  I informed her about the results.  Objective: Vitals:   10/22/21 0018 10/22/21 0401 10/22/21 0817 10/22/21 1148  BP: 109/64 106/62 107/75 (!) 90/57  Pulse: 74 72 77 72  Resp: 20 20 20  20   Temp: 98.3 F (36.8 C) 99.5 F (37.5 C) 99.8 F (37.7 C) 98.9 F (37.2 C)  TempSrc: Oral Oral Oral Oral  SpO2: 97% 95% 96% 92%  Weight:      Height:        Intake/Output Summary (Last 24 hours) at 10/22/2021 1310 Last data filed at 10/22/2021 0456 Gross per 24 hour  Intake 2295 ml  Output 1525 ml  Net 770 ml    Filed Weights   10/14/21 1821 10/15/21 2159  Weight: (!) 158.8 kg (!) 159.2 kg    Examination:  General exam: Appears calm and comfortable, morbidly obese Respiratory system: Clear to auscultation. Respiratory effort normal. Cardiovascular system: S1 & S2 heard, RRR. No JVD, murmurs, rubs, gallops or clicks. No pedal edema. Gastrointestinal system: Abdomen is nondistended, soft and nontender. No organomegaly or masses felt. Normal bowel sounds heard. Central nervous system: Alert and oriented. No focal neurological deficits. Extremities: Left BKA Skin: No rashes, lesions or ulcers.  Psychiatry: Judgement and insight appear normal. Mood & affect appropriate.    Data Reviewed: I have personally reviewed following labs and imaging studies  CBC: Recent Labs  Lab 10/17/21 0416 10/18/21 0159 10/19/21 0251 10/20/21 0123 10/22/21 0130  WBC 8.9 8.1 7.5 8.8 16.8*  HGB 8.6* 8.4* 9.0* 9.0* 9.0*  HCT 28.3* 27.9* 29.3* 29.5* 29.5*  MCV 79.1* 78.2* 78.8* 77.0* 79.7*  PLT 235 225 217 257 062    Basic Metabolic Panel: Recent Labs  Lab 10/17/21 0416 10/18/21 0159 10/19/21 0251 10/20/21 0123 10/22/21 0130  NA 136 135 137 135 133*  K 4.3 4.5 4.4 4.5 4.6  CL 103 103 104 101 98  CO2 24 22 24 25 25   GLUCOSE 139* 162* 143* 167* 178*  BUN 37* 31* 23 19 26*  CREATININE 1.30* 1.14 1.17 1.12 1.55*  CALCIUM 8.6* 8.6* 8.8* 8.8* 8.2*    GFR: Estimated Creatinine Clearance: 71.3 mL/min (A) (by C-G formula based on SCr of 1.55 mg/dL (H)). Liver Function Tests: No results for input(s): AST, ALT, ALKPHOS, BILITOT, PROT, ALBUMIN in the last 168 hours.  No results for  input(s): LIPASE, AMYLASE in the last 168 hours. No results for input(s): AMMONIA in the last 168 hours. Coagulation Profile: No results for input(s): INR, PROTIME in the last 168 hours. Cardiac Enzymes: No results for input(s): CKTOTAL, CKMB, CKMBINDEX, TROPONINI in the last 168 hours. BNP (last 3 results) No results for input(s): PROBNP  in the last 8760 hours. HbA1C: No results for input(s): HGBA1C in the last 72 hours.  CBG: Recent Labs  Lab 10/21/21 1549 10/21/21 1739 10/21/21 2206 10/22/21 0822 10/22/21 1137  GLUCAP 123* 140* 177* 145* 158*    Lipid Profile: No results for input(s): CHOL, HDL, LDLCALC, TRIG, CHOLHDL, LDLDIRECT in the last 72 hours. Thyroid Function Tests: No results for input(s): TSH, T4TOTAL, FREET4, T3FREE, THYROIDAB in the last 72 hours. Anemia Panel: No results for input(s): VITAMINB12, FOLATE, FERRITIN, TIBC, IRON, RETICCTPCT in the last 72 hours.  Sepsis Labs: No results for input(s): PROCALCITON, LATICACIDVEN in the last 168 hours.   Recent Results (from the past 240 hour(s))  Resp Panel by RT-PCR (Flu A&B, Covid) Nasopharyngeal Swab     Status: None   Collection Time: 10/14/21 11:36 PM   Specimen: Nasopharyngeal Swab; Nasopharyngeal(NP) swabs in vial transport medium  Result Value Ref Range Status   SARS Coronavirus 2 by RT PCR NEGATIVE NEGATIVE Final    Comment: (NOTE) SARS-CoV-2 target nucleic acids are NOT DETECTED.  The SARS-CoV-2 RNA is generally detectable in upper respiratory specimens during the acute phase of infection. The lowest concentration of SARS-CoV-2 viral copies this assay can detect is 138 copies/mL. A negative result does not preclude SARS-Cov-2 infection and should not be used as the sole basis for treatment or other patient management decisions. A negative result may occur with  improper specimen collection/handling, submission of specimen other than nasopharyngeal swab, presence of viral mutation(s) within  the areas targeted by this assay, and inadequate number of viral copies(<138 copies/mL). A negative result must be combined with clinical observations, patient history, and epidemiological information. The expected result is Negative.  Fact Sheet for Patients:  EntrepreneurPulse.com.au  Fact Sheet for Healthcare Providers:  IncredibleEmployment.be  This test is no t yet approved or cleared by the [place] FDA and  has been authorized for detection and/or diagnosis of SARS-CoV-2 by FDA under an Emergency Use Authorization (EUA). This EUA will remain  in effect (meaning this test can be used) for the duration of the COVID-19 declaration under Section 564(b)(1) of the Act, 21 U.S.C.section 360bbb-3(b)(1), unless the authorization is terminated  or revoked sooner.       Influenza A by PCR NEGATIVE NEGATIVE Final   Influenza B by PCR NEGATIVE NEGATIVE Final    Comment: (NOTE) The Xpert Xpress SARS-CoV-2/FLU/RSV plus assay is intended as an aid in the diagnosis of influenza from Nasopharyngeal swab specimens and should not be used as a sole basis for treatment. Nasal washings and aspirates are unacceptable for Xpert Xpress SARS-CoV-2/FLU/RSV testing.  Fact Sheet for Patients: EntrepreneurPulse.com.au  Fact Sheet for Healthcare Providers: IncredibleEmployment.be  This test is not yet approved or cleared by the [place] FDA and has been authorized for detection and/or diagnosis of SARS-CoV-2 by FDA under an Emergency Use Authorization (EUA). This EUA will remain in effect (meaning this test can be used) for the duration of the COVID-19 declaration under Section 564(b)(1) of the Act, 21 U.S.C. section 360bbb-3(b)(1), unless the authorization is terminated or revoked.  Performed at [facility], [place] [address], [place], [place] [address]   Surgical pcr screen     Status: Abnormal    Collection Time: 10/18/21  7:55 PM   Specimen: Nasal Mucosa; Nasal Swab  Result Value Ref Range Status   MRSA, PCR NEGATIVE NEGATIVE Final   Staphylococcus aureus POSITIVE (A) NEGATIVE Final    Comment: (NOTE) The Xpert SA Assay (FDA approved for  NASAL specimens in patients 32 years of age and older), is one component of a comprehensive surveillance program. It is not intended to diagnose infection nor to guide or monitor treatment. Performed at [facility], [place] [address] [place], [place] [address]       Radiology Studies: No results found.  Scheduled Meds:  sodium chloride   Intravenous Once   sodium chloride   Intravenous Once   allopurinol  300 mg Oral Daily   vitamin C  1,000 mg Oral Daily   atorvastatin  10 mg Oral Daily   Chlorhexidine Gluconate Cloth  6 each Topical Q0600   docusate sodium  100 mg Oral BID   hydrALAZINE  50 mg Oral BID   insulin aspart  0-15 Units Subcutaneous TID WC   insulin glargine-yfgn  10 Units Subcutaneous Daily   metoprolol succinate  100 mg Oral Daily   mupirocin ointment  1 application Nasal BID   nutrition supplement (JUVEN)  1 packet Oral BID BM   pantoprazole  40 mg Oral BID   polyethylene glycol  17 g Oral BID   zinc sulfate  220 mg Oral Daily   Continuous Infusions:  sodium chloride     ceFEPime (MAXIPIME) IV 2 g (10/22/21 1044)   magnesium sulfate bolus IVPB     vancomycin 1,000 mg (10/22/21 0456)     LOS: 7 days   Time spent: 24 minutes  Darliss Cheney, MD [facility]  10/22/2021, 1:10 PM  Please page via [place] and do not message via secure chat for urgent patient care matters. Secure chat can be used for non urgent patient care matters.  How to contact the [facility] Attending or Consulting provider [place] or covering provider during after hours [place], for this patient?  Check the care team in [facility] and look for a) attending/consulting [facility] provider listed and b) the [facility] team listed. Page or secure chat  [id number]. Log into [email or web address] and use [place]'s universal password to access. If you do not have the password, please contact the hospital operator. Locate the [facility] provider you are looking for under [facility] and page to a number that you can be directly reached. If you still have difficulty reaching the provider, please page the [facility] (Director on Call) for the Hospitalists listed on amion for assistance.

## 2021-10-22 NOTE — Progress Notes (Addendum)
Physical Therapy Treatment Patient Details Name: [name] [name] MRN: [id number] DOB: [birth year] Today's Date: 10/22/2021   History of Present Illness Pt is a 66 y.o. male admitted 10/14/21 with LLE pain; pt reports he turned his L ankle when getting into car 08/2021 and has not been able to bear weight since then. Workup for chronic venous insufficiency and L ankle abscess with osteomyelitis. S/p EGD and colonoscopy on 2/2. Plan for L transtibial amputation 2/3. PMH includes morbid obesity, CHF, HTN, CVA.    PT Comments    Pt was seen with family present to offer him some support.  Pt was assisted with two mod assist to side of bed and worked on sitting control.  His ability to lift hips on side of bed was good, mod assist to clear partially with support by PT on R knee.  Noted limb protector is not a complete fit with the edema from surgery, and reported to nursing to see if a larger splint can be obtained. Talked with pt about getting to the chair with a maximove, and working on standing with standing lift as he can tolerate.  Pt is motivated and will see if CIR can be attempted, but recommendation is SNF if this does not work out.  Follow up with goals of acute PT.  Recommendations for follow up therapy are one component of a multi-disciplinary discharge planning process, led by the attending physician.  Recommendations may be updated based on patient status, additional functional criteria and insurance authorization.  Follow Up Recommendations  Acute inpatient rehab (3hours/day)     Assistance Recommended at Discharge Frequent or constant Supervision/Assistance  Patient can return home with the following Two people to help with bathing/dressing/bathroom;Two people to help with walking and/or transfers;Help with stairs or ramp for entrance   Equipment Recommendations  Other (comment) (bari equipment once in rehab)    Recommendations for Other Services       Precautions / Restrictions  Precautions Precautions: Fall;Other (comment) Precaution Comments: new L BKA Restrictions Weight Bearing Restrictions: Yes LLE Weight Bearing: Non weight bearing     Mobility  Bed Mobility Overal bed mobility: Needs Assistance Bed Mobility: Supine to Sit, Sit to Supine     Supine to sit: Max assist, Mod assist, +2 for physical assistance, +2 for safety/equipment Sit to supine: Mod assist, +2 for physical assistance, +2 for safety/equipment        Transfers Overall transfer level: Needs assistance   Transfers: Bed to chair/wheelchair/BSC            Lateral/Scoot Transfers: Mod assist General transfer comment: with bed adjusted could get a small lift on hips on side of bed with limb protector in place and PT supporting R knee    Ambulation/Gait               General Gait Details: unable to try   Stairs             Wheelchair Mobility    Modified Rankin (Stroke Patients Only)       Balance Overall balance assessment: Needs assistance Sitting-balance support: Feet supported, Bilateral upper extremity supported (RLE) Sitting balance-Leahy Scale: Poor Sitting balance - Comments: initially required mod assist to maintain but began to flex into it to use UE's and support Postural control: Posterior lean  Cognition Arousal/Alertness: Awake/alert Behavior During Therapy: WFL for tasks assessed/performed, Flat affect Overall Cognitive Status: Within Functional Limits for tasks assessed                                 General Comments: slow speech but appropriate        Exercises      General Comments General comments (skin integrity, edema, etc.): pt is requiring two person help to get onto and off bed,      Pertinent Vitals/Pain Pain Assessment Pain Assessment: Faces Faces Pain Scale: Hurts little more Pain Location: LLE to try on the limb protector Pain Descriptors / Indicators:  Guarding Pain Intervention(s): Limited activity within patient's tolerance, Monitored during session, Repositioned    Home Living                          Prior Function            PT Goals (current goals can now be found in the care plan section) Acute Rehab PT Goals Patient Stated Goal: less pain, be able to stand    Frequency    Min 4X/week      PT Plan Discharge plan needs to be updated;Frequency needs to be updated    Co-evaluation              AM-PAC PT "6 Clicks" Mobility   Outcome Measure  Help needed turning from your back to your side while in a flat bed without using bedrails?: A Lot Help needed moving from lying on your back to sitting on the side of a flat bed without using bedrails?: Total Help needed moving to and from a bed to a chair (including a wheelchair)?: Total Help needed standing up from a chair using your arms (e.g., wheelchair or bedside chair)?: Total Help needed to walk in hospital room?: Total Help needed climbing 3-5 steps with a railing? : Total 6 Click Score: 7    End of Session   Activity Tolerance: Patient tolerated treatment well Patient left: in bed;with call bell/phone within reach Nurse Communication: Mobility status;Need for lift equipment PT Visit Diagnosis: Pain;Other abnormalities of gait and mobility (R26.89);Muscle weakness (generalized) (M62.81);Difficulty in walking, not elsewhere classified (R26.2) Pain - Right/Left: Left Pain - part of body: Leg     Time: 0240-9735 PT Time Calculation (min) (ACUTE ONLY): 32 min  Charges:  $Therapeutic Activity: 23-37 mins     Ramond Dial 10/22/2021, 2:08 PM  Mee Hives, PT PhD Acute Rehab Dept. Number: [place] and [place]

## 2021-10-22 NOTE — Progress Notes (Signed)
Inpatient Rehab Admissions Coordinator:   I spoke with pt. And sister regarding potential CIR admit. They state interest and indicate that pt. Will have 24/7 support at d/c. I will follow for potential admission pending medical readiness and bed availability.  Clemens Catholic, [place], [place] Admissions Coordinator  [phone] ([place]) [phone] (office)

## 2021-10-22 NOTE — Progress Notes (Signed)
Patient ID: [name] [name], male   DOB: 22-Jan-1956, 66 y.o.   MRN: [id number] Patient has pushed down the derma tack dressing and the wound VAC is leaking now.  Patient states that he was itching.  I have ordered Ioban from the operating room to be delivered through the tube system.  This should be cut in thirds and used to wrap around the top of the dressing to provide a complete seal.  Patient was instructed in manipulating the dressing.  Anticipate discharge to skilled nursing.

## 2021-10-23 LAB — CBC
HCT: 25.7 % — ABNORMAL LOW (ref 39.0–52.0)
Hemoglobin: 7.9 g/dL — ABNORMAL LOW (ref 13.0–17.0)
MCH: 24.4 pg — ABNORMAL LOW (ref 26.0–34.0)
MCHC: 30.7 g/dL (ref 30.0–36.0)
MCV: 79.3 fL — ABNORMAL LOW (ref 80.0–100.0)
Platelets: 226 10*3/uL (ref 150–400)
RBC: 3.24 MIL/uL — ABNORMAL LOW (ref 4.22–5.81)
RDW: 19.6 % — ABNORMAL HIGH (ref 11.5–15.5)
WBC: 16 10*3/uL — ABNORMAL HIGH (ref 4.0–10.5)
nRBC: 0.6 % — ABNORMAL HIGH (ref 0.0–0.2)

## 2021-10-23 LAB — BASIC METABOLIC PANEL
Anion gap: 11 (ref 5–15)
BUN: 35 mg/dL — ABNORMAL HIGH (ref 8–23)
CO2: 21 mmol/L — ABNORMAL LOW (ref 22–32)
Calcium: 7.6 mg/dL — ABNORMAL LOW (ref 8.9–10.3)
Chloride: 100 mmol/L (ref 98–111)
Creatinine, Ser: 1.98 mg/dL — ABNORMAL HIGH (ref 0.61–1.24)
GFR, Estimated: 37 mL/min — ABNORMAL LOW (ref >60)
Glucose, Bld: 158 mg/dL — ABNORMAL HIGH (ref 70–99)
Potassium: 5.4 mmol/L — ABNORMAL HIGH (ref 3.5–5.1)
Sodium: 132 mmol/L — ABNORMAL LOW (ref 135–145)

## 2021-10-23 LAB — GLUCOSE, CAPILLARY
Glucose-Capillary: 138 mg/dL — ABNORMAL HIGH (ref 70–99)
Glucose-Capillary: 162 mg/dL — ABNORMAL HIGH (ref 70–99)
Glucose-Capillary: 161 mg/dL — ABNORMAL HIGH (ref 70–99)
Glucose-Capillary: 169 mg/dL — ABNORMAL HIGH (ref 70–99)

## 2021-10-23 MED ORDER — SODIUM CHLORIDE 0.9 % IV SOLN: INTRAVENOUS | Status: AC

## 2021-10-23 MED ORDER — DIPHENHYDRAMINE HCL 25 MG PO CAPS
25.0000 mg | ORAL_CAPSULE | Freq: Once | ORAL | Status: AC
  Administered 2021-10-23: 25 mg via ORAL
  Filled 2021-10-23: qty 1

## 2021-10-23 MED ORDER — METOPROLOL SUCCINATE ER 25 MG PO TB24
50.0000 mg | ORAL_TABLET | Freq: Every day | ORAL | Status: DC
  Administered 2021-10-23 – 2021-10-24 (×2): 50 mg via ORAL
  Filled 2021-10-23 (×2): qty 2

## 2021-10-23 NOTE — Progress Notes (Signed)
° °  Subjective: 2 Days Post-Op Procedure(s) (LRB): LEFT BELOW KNEE AMPUTATION (Left) Patient reports pain as mild.  C/O phantom sensations.  " Some little kid is kicking me in the shin"   Objective: Vital signs in last 24 hours: Temp:  [98.2 F (36.8 C)-99.5 F (37.5 C)] 98.3 F (36.8 C) (02/05 0738) Pulse Rate:  [63-72] 63 (02/05 0738) Resp:  [18-20] 18 (02/05 0738) BP: (90-122)/(54-74) 122/74 (02/05 0738) SpO2:  [91 %-96 %] 96 % (02/05 0738)  Intake/Output from previous day: 02/04 0701 - 02/05 0700 In: 560 [P.O.:560] Out: 975 [Urine:925; Drains:50] Intake/Output this shift: No intake/output data recorded.  Recent Labs    10/22/21 0130 10/23/21 0051  HGB 9.0* 7.9*   Recent Labs    10/22/21 0130 10/23/21 0051  WBC 16.8* 16.0*  RBC 3.70* 3.24*  HCT 29.5* 25.7*  PLT 268 226   Recent Labs    10/22/21 0130 10/23/21 0051  NA 133* 132*  K 4.6 5.4*  CL 98 100  CO2 25 21*  BUN 26* 35*  CREATININE 1.55* 1.98*  GLUCOSE 178* 158*  CALCIUM 8.2* 7.6*   No results for input(s): LABPT, INR in the last 72 hours.  PE:  VAC seal good.  No results found.  Assessment/Plan: 2 Days Post-Op Procedure(s) (LRB): LEFT BELOW KNEE AMPUTATION (Left) No change in plan  NWB, placement  [name] [name] 10/23/2021, 11:41 AM

## 2021-10-23 NOTE — PMR Pre-admission (Signed)
PMR Admission Coordinator Pre-Admission Assessment °  °Patient: [name] [name] is an 65 y.o., male °MRN: [id number] °DOB: 07/30/1956 °Height: 5' 10" (177.8 cm) °Weight: (!) 159.2 kg °  °Insurance Information °HMO:    PPO:      PCP:      IPA:      80/20: yes     OTHER:  °PRIMARY: Medicare A and B       Policy#: [id number]      Subscriber: Pt.  °Medicare       Policy#:       Subscriber: Pt. °Phone#: Verified online    Fax#:  °Pre-Cert#:       Employer:  °Benefits:  Phone #:      Name:  °Eff. Date: Parts A ad B effective  Deduct: $1600      Out of Pocket Max:  None      Life Max: N/A  °CIR: 100%      SNF: 100 days °Outpatient: 80%     Co-Pay: 20% °Home Health: 100%      Co-Pay: none °DME: 80%     Co-Pay: 20% °Providers: patient's choice °Providers: In network °SECONDARY: [employer] commercial      Policy#: [id number]     Phone#:  °  °Financial Counselor:       Phone#:  °  °The “Data Collection Information Summary” for patients in Inpatient Rehabilitation Facilities with attached “Privacy Act Statement-Health Care Records” was provided and verbally reviewed with: Patient °  °Emergency Contact Information °Contact Information   °  °  Name Relation Home Work Mobile  °  Jobe,Cassie Daughter [phone]   [phone]  °  °   °  °  °Current Medical History  °Patient Admitting Diagnosis: BKA °History of Present Illness: [name] [name] is a 65-year-old right-handed male with history of CVA maintained on aspirin and Plavix, asbestosis, hypertension, type 2 diabetes mellitus, OSA not on CPAP, obesity with BMI 50.22, diastolic congestive heart failure, chronic venous stasis and lymphedema of bilateral lower extremities followed by wound care.  Per chart review patient lives alone.  He does have a personal care attendant as well as good support from his daughter and sister.  PCA assist with ADLs.  Patient was using a cane for short distances but mostly wheelchair-bound.  1 level home with ramped entrance.  Presented 10/14/2021 with  persistent left ankle pain since he twisted his ankle getting into his car approximately 1 month ago.  He was initially referred to [facility] x-rays negative for fracture.  Patient notes extensive edema venous stasis ulceration with purulent drainage and necrotic ulcers of left lower extremity.  Admission chemistries glucose 151 BUN 71 creatinine 1.74, WBC 12,000 hemoglobin 9.4 down from recent 13.3, lactic acid 1.4 FOBT positive.  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  Septic arthritis of the tibiotalar, subtalar and talonavicular joints.  Diffuse soft tissue swelling and subcutaneous edema.  Gastroenterology services consulted for rectal bleeding FOBT positive.  Patient underwent colonoscopy/EGD 10/19/2021 per Dr. Dorsey that examined portion of ileum was normal there was a single solitary ulcer at the ileocecal valve.  Seven 3 to 7 mm polyps in the sigmoid colon and descending colon removed with a cold snare.  Nonbleeding internal hemorrhoids.  Patient was placed on PPI twice daily x8 weeks.  Patient's aspirin and Plavix initially held and has since been resumed 10/22/2021 with latest hemoglobin 7.9.  Follow-up orthopedic services Dr. 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  Noted AKI baseline serum creatinine around 1.3 presentation 1.74-1.98 and did receive IV fluids x10 hours and monitored.  Blood pressure was somewhat soft hydralazine discontinued Toprol reduced to 50 mg to allow better perfusion for kidneys.  Therapy evaluations completed and due to patient decreased functional mobility was admitted for a comprehensive rehab program. °  °  °Patient's medical record from [facility] has been reviewed by  the rehabilitation admission coordinator and physician. °  °Past Medical History  °    °Past Medical History:  °Diagnosis Date  ° 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HCC)    ° Essential hypertension    ° History of nephrolithiasis    ° History of open leg wound    ° History of stroke July 2004  ° Obstructive sleep apnea    ° TIA (transient ischemic attack)    ° Varicose veins    °  Laser ablation of left great saphenous vein 2009  °  °  °Has the patient had major surgery during 100 days prior to admission? Yes °  °Family History   °family history includes Bone cancer in his mother; Diabetes in his paternal grandfather; Heart attack in his brother and sister; Hypertension in his brother and father; Pancreatic cancer in his maternal grandmother; Stroke in his paternal grandmother. °  °Current Medications °  °Current Facility-Administered Medications:  °  0.9 %  sodium chloride infusion (Manually program via Guardrails IV Fluids), , Intravenous, Once, Duda, Marcus V, MD °  0.9 %  sodium chloride infusion (Manually program via Guardrails IV Fluids), , Intravenous, Once, Duda, Marcus V, MD °  0.9 %  sodium chloride infusion, , Intravenous, Continuous, Duda, Marcus V, MD °  0.9 %  sodium chloride infusion, , Intravenous, Continuous, Pahwani, Ravi, MD, Last Rate: 125 mL/hr at 10/23/21 1007, New Bag at 10/23/21 1007 °  acetaminophen (TYLENOL) tablet 325-650 mg, 325-650 mg, Oral, Q6H PRN, Duda, Marcus V, MD °  allopurinol (ZYLOPRIM) tablet 300 mg, 300 mg, Oral, Daily, Duda, Marcus V, MD, 300 mg at 10/23/21 1006 °  alum & mag hydroxide-simeth (MAALOX/MYLANTA) 200-200-20 MG/5ML suspension 15-30 mL, 15-30 mL, Oral, Q2H PRN, Duda, Marcus V, MD °  ascorbic acid (VITAMIN C) tablet 1,000 mg, 1,000 mg, Oral, Daily, Duda, Marcus V, MD, 1,000 mg at 10/23/21 1006 °  aspirin EC tablet 81 mg, 81 mg, Oral, Daily, Pahwani, Ravi, MD, 81 mg at 10/23/21 1006 °  atorvastatin (LIPITOR) tablet 10 mg, 10 mg, Oral, Daily, Duda, Marcus V, MD, 10 mg at 10/23/21 1006 °   bisacodyl (DULCOLAX) EC tablet 5 mg, 5 mg, Oral, Daily PRN, Duda, Marcus V, MD °  clopidogrel (PLAVIX) tablet 75 mg, 75 mg, Oral, Daily, Pahwani, Ravi, MD, 75 mg at 10/23/21 1006 °  docusate sodium (COLACE) capsule 100 mg, 100 mg, Oral, BID, Duda, Marcus V, MD, 100 mg at 10/23/21 1006 °  guaiFENesin-dextromethorphan (ROBITUSSIN DM) 100-10 MG/5ML syrup 15 mL, 15 mL, Oral, Q4H PRN, Duda, Marcus V, MD °  hydrALAZINE (APRESOLINE) injection 5 mg, 5 mg, Intravenous, Q20 Min PRN, Duda, Marcus V, MD °  HYDROcodone-acetaminophen (NORCO) 7.5-325 MG per tablet 1-2 tablet, 1-2 tablet, Oral, Q4H PRN, Duda, Marcus V, MD, 2 tablet at 10/22/21 1945 °  HYDROcodone-acetaminophen (NORCO/VICODIN) 5-325 MG per tablet   1-2 tablet, 1-2 tablet, Oral, Q4H PRN, Duda, Marcus V, MD, 2 tablet at 10/22/21 1515 °  HYDROmorphone (DILAUDID) injection 0.5 mg, 0.5 mg, Intravenous, Q4H PRN, Duda, Marcus V, MD, 0.5 mg at 10/19/21 0611 °  insulin aspart (novoLOG) injection 0-15 Units, 0-15 Units, Subcutaneous, TID WC, Duda, Marcus V, MD, 2 Units at 10/23/21 0814 °  insulin glargine-yfgn (SEMGLEE) injection 10 Units, 10 Units, Subcutaneous, Daily, Duda, Marcus V, MD, 10 Units at 10/23/21 1005 °  labetalol (NORMODYNE) injection 10 mg, 10 mg, Intravenous, Q10 min PRN, Duda, Marcus V, MD °  magnesium citrate solution 1 Bottle, 1 Bottle, Oral, Once PRN, Duda, Marcus V, MD °  magnesium sulfate IVPB 2 g 50 mL, 2 g, Intravenous, Daily PRN, Duda, Marcus V, MD °  metoprolol succinate (TOPROL-XL) 24 hr tablet 50 mg, 50 mg, Oral, Daily, Pahwani, Ravi, MD, 50 mg at 10/23/21 1016 °  metoprolol tartrate (LOPRESSOR) injection 2-5 mg, 2-5 mg, Intravenous, Q2H PRN, Duda, Marcus V, MD °  morphine (PF) 2 MG/ML injection 0.5-1 mg, 0.5-1 mg, Intravenous, Q2H PRN, Duda, Marcus V, MD °  mupirocin ointment (BACTROBAN) 2 % 1 application, 1 application, Nasal, BID, Duda, Marcus V, MD, 1 application at 10/23/21 1012 °  nutrition supplement (JUVEN) (JUVEN) powder packet 1 packet, 1  packet, Oral, BID BM, Duda, Marcus V, MD °  ondansetron (ZOFRAN) tablet 4 mg, 4 mg, Oral, Q6H PRN **OR** ondansetron (ZOFRAN) injection 4 mg, 4 mg, Intravenous, Q6H PRN, Duda, Marcus V, MD °  pantoprazole (PROTONIX) EC tablet 40 mg, 40 mg, Oral, BID, Duda, Marcus V, MD, 40 mg at 10/23/21 1006 °  phenol (CHLORASEPTIC) mouth spray 1 spray, 1 spray, Mouth/Throat, PRN, Duda, Marcus V, MD °  polyethylene glycol (MIRALAX / GLYCOLAX) packet 17 g, 17 g, Oral, BID, Duda, Marcus V, MD, 17 g at 10/23/21 1006 °  polyethylene glycol (MIRALAX / GLYCOLAX) packet 17 g, 17 g, Oral, Daily PRN, Duda, Marcus V, MD °  potassium chloride SA (KLOR-CON M) CR tablet 20-40 mEq, 20-40 mEq, Oral, Daily PRN, Duda, Marcus V, MD °  zinc sulfate capsule 220 mg, 220 mg, Oral, Daily, Duda, Marcus V, MD, 220 mg at 10/23/21 1006 °  °Patients Current Diet:  °Diet Order   °  °         °    Diet Carb Modified Fluid consistency: Thin; Room service appropriate? Yes  Diet effective now       °  °  °   °  °  °   °  °  °Precautions / Restrictions °Precautions °Precautions: Fall, Other (comment) °Precaution Comments: new L BKA °Restrictions °Weight Bearing Restrictions: Yes °LLE Weight Bearing: Non weight bearing  °  °Has the patient had 2 or more falls or a fall with injury in the past year? Yes °  °Prior Activity Level °Community (5-7x/wk): Pt. active in the community PTA °  °Prior Functional Level °Self Care: Did the patient need help bathing, dressing, using the toilet or eating? Independent °  °Indoor Mobility: Did the patient need assistance with walking from room to room (with or without device)? Independent °  °Stairs: Did the patient need assistance with internal or external stairs (with or without device)? Independent °  °Functional Cognition: Did the patient need help planning regular tasks such as shopping or remembering to take medications? Independent °  °Patient Information °Are you of Hispanic, Latino/a,or Spanish origin?: A. No, not of  Hispanic, Latino/a, or Spanish origin °What is your   race?: A. White °Do you need or want an interpreter to communicate with a doctor or health care staff?: 0. No °  °Patient's Response To:  °Health Literacy and Transportation °Is the patient able to respond to health literacy and transportation needs?: Yes °Health Literacy - How often do you need to have someone help you when you read instructions, pamphlets, or other written material from your doctor or pharmacy?: Never °In the past 12 months, has lack of transportation kept you from medical appointments or from getting medications?: No °In the past 12 months, has lack of transportation kept you from meetings, work, or from getting things needed for daily living?: No °  °Home Assistive Devices / Equipment °Home Assistive Devices/Equipment: Walker (specify type), Wheelchair °Home Equipment: BSC/3in1, Wheelchair - power, Rolling Walker (2 wheels) °  °Prior Device Use: Indicate devices/aids used by the patient prior to current illness, exacerbation or injury? None of the above °  °Current Functional Level °Cognition °  Overall Cognitive Status: Within Functional Limits for tasks assessed °Orientation Level: Oriented X4 °General Comments: slow speech but appropriate °   °Extremity Assessment °(includes Sensation/Coordination) °  Upper Extremity Assessment: RUE deficits/detail °RUE Deficits / Details: hx of CVA and residual weakness, unable to lift UE beyond 90*, PROM WFL °RUE Sensation: decreased light touch °RUE Coordination: decreased fine motor, decreased gross motor  °Lower Extremity Assessment: Defer to PT evaluation °RLE Deficits / Details: Erythema, edema, grossly weak °LLE Deficits / Details: LLE ulceration with purulent drainage/weeping, gross edema, erythema, foot deformity  °   °ADLs °  Overall ADL's : Needs assistance/impaired °Eating/Feeding: Set up, Sitting °Grooming: Set up, Sitting °Grooming Details (indicate cue type and reason): pt completed shaving  task sitting EOB > 20 min, some assist for throughness and due to small mirror/lower lighting °Upper Body Bathing: Moderate assistance, Sitting °Upper Body Bathing Details (indicate cue type and reason): to wash back sitting EOB °Lower Body Bathing: Maximal assistance, +2 for safety/equipment, +2 for physical assistance, Bed level °Upper Body Dressing : Minimal assistance, Sitting °Upper Body Dressing Details (indicate cue type and reason): able to doff/don gown °Lower Body Dressing: Total assistance, Bed level °Lower Body Dressing Details (indicate cue type and reason): to don R sock °Toilet Transfer: Total assistance, +2 for physical assistance, +2 for safety/equipment °Toileting- Clothing Manipulation and Hygiene: Total assistance, +2 for physical assistance, +2 for safety/equipment, Bed level °Functional mobility during ADLs: Moderate assistance, Maximal assistance, +2 for physical assistance °General ADL Comments: Pt re-eval s/p BKA this session, continues to require increased assist for all ADL's, unable to stand this session due to weakness and pain  °   °Mobility °  Overal bed mobility: Needs Assistance °Bed Mobility: Supine to Sit, Sit to Supine °Supine to sit: Mod assist, +2 for physical assistance, +2 for safety/equipment °Sit to supine: Mod assist, +2 for physical assistance, +2 for safety/equipment °General bed mobility comments: Mod A +2 to pull to sitting and bring BLE off the bed  °   °Transfers °  Overall transfer level: Needs assistance °Transfers: Bed to chair/wheelchair/BSC °Bed to/from chair/wheelchair/BSC transfer type:: Lateral/scoot transfer ° Lateral/Scoot Transfers: Mod assist °General transfer comment: Deferred due to pt weakness and fatigue  °   °Ambulation / Gait / Stairs / Wheelchair Mobility °  Ambulation/Gait °General Gait Details: unable to try  °   °Posture / Balance Dynamic Sitting Balance °Sitting balance - Comments: initially required mod assist to maintain but began to flex into  it to use UE's and   support °Balance °Overall balance assessment: Needs assistance °Sitting-balance support: Feet supported, Bilateral upper extremity supported (RLE) °Sitting balance-Leahy Scale: Poor °Sitting balance - Comments: initially required mod assist to maintain but began to flex into it to use UE's and support °Postural control: Posterior lean  °   °Special needs/care consideration Wound Vac Left and Skin cellulitis R  leg,excoriation on bilateral buttocks, weeping on R leg.   °  °Previous Home Environment (from acute therapy documentation) °Living Arrangements: Alone °Available Help at Discharge: Neighbor, Friend(s), Family, Other (Comment), Personal care attendant °Type of Home: House °Home Layout: One level °Home Access: Ramped entrance °Bathroom Shower/Tub: Walk-in shower °Bathroom Accessibility: No °Home Care Services: Yes °Type of Home Care Services: Home RN °Additional Comments: sleeps in lift chair °  °Discharge Living Setting °Plans for Discharge Living Setting: Alone °Type of Home at Discharge: House °Discharge Home Layout: One level °Discharge Home Access: Ramped entrance °Discharge Bathroom Shower/Tub: Walk-in shower °Discharge Bathroom Toilet: Handicapped height °Discharge Bathroom Accessibility: Yes °How Accessible: Accessible via walker °Does the patient have any problems obtaining your medications?: No °  °Social/Family/Support Systems °Patient Roles: Spouse, Other (Comment) °Contact Information: [phone] °Anticipated Caregiver: Joye Farmer °Ability/Limitations of Caregiver: Can provide Min A °Caregiver Availability: 24/7 °Discharge Plan Discussed with Primary Caregiver: Yes °Is Caregiver In Agreement with Plan?: Yes °Does Caregiver/Family have Issues with Lodging/Transportation while Pt is in Rehab?: No °  °Goals °Patient/Family Goal for Rehab: PT/OT Min guard °Expected length of stay: 12-14 days °Pt/Family Agrees to Admission and willing to participate: Yes °Program Orientation  Provided & Reviewed with Pt/Caregiver Including Roles  & Responsibilities: Yes °  °Decrease burden of Care through IP rehab admission: Specialzed equipment needs, Decrease number of caregivers, Bowel and bladder program, and Patient/family education °  °Possible need for SNF placement upon discharge: not anticipated  °  °Patient Condition: I have reviewed medical records from [facility], spoken with CM, and patient. I met with patient at the bedside for inpatient rehabilitation assessment.  Patient will benefit from ongoing PT and OT, can actively participate in 3 hours of therapy a day 5 days of the week, and can make measurable gains during the admission.  Patient will also benefit from the coordinated team approach during an Inpatient Acute Rehabilitation admission.  The patient will receive intensive therapy as well as Rehabilitation physician, nursing, social worker, and care management interventions.  Due to safety, skin/wound care, disease management, medication administration, pain management, and patient education the patient requires 24 hour a day rehabilitation nursing.  The patient is currently mod-max A  with mobility and basic ADLs.  Discharge setting and therapy post discharge at home with home health is anticipated.  Patient has agreed to participate in the Acute Inpatient Rehabilitation Program and will admit today. °  °Preadmission Screen Completed By:  Laura B Staley, 10/23/2021 10:21 AM °______________________________________________________________________   °Discussed status with Dr. Girlie Veltri  on 10/24/21 at 1000 and received approval for admission today. °  °Admission Coordinator:  Laura B Staley, CCC-SLP, time 1110/Date 10/24/21 °  °Assessment/Plan: °Diagnosis:Left BKA 10/21/21 °Does the need for close, 24 hr/day Medical supervision in concert with the patient's rehab needs make it unreasonable for this patient to be served in a less intensive setting? Yes °Co-Morbidities requiring  supervision/potential complications: CHF with chronic LE edema, DM 2, Morbid obesity BMI 50, hx CVA with chronic Right hemiparesis °Due to bladder management, bowel management, safety, skin/wound care, disease management, medication administration, pain management, and patient education, does the   patient require 24 hr/day rehab nursing? Yes °Does the patient require coordinated care of a physician, rehab nurse, PT, OT, and SLP to address physical and functional deficits in the context of the above medical diagnosis(es)? Yes °Addressing deficits in the following areas: balance, endurance, locomotion, strength, transferring, bowel/bladder control, bathing, dressing, feeding, grooming, toileting, cognition, and psychosocial support °Can the patient actively participate in an intensive therapy program of at least 3 hrs of therapy 5 days a week? Yes °The potential for patient to make measurable gains while on inpatient rehab is good °Anticipated functional outcomes upon discharge from inpatient rehab: supervision and min assist PT, supervision and min assist OT, modified independent SLP °Estimated rehab length of stay to reach the above functional goals is: 12-14d °Anticipated discharge destination: Home °10. Overall Rehab/Functional Prognosis: fair °  °  °MD Signature: °Natarsha Hurwitz E. Hayze Gazda M.D. °[facility] °Fellow Am [employer] of [employer] and Rehab °Diplomate Am Board of Electrodiagnostic Med °Fellow Am Board of Interventional Pain °

## 2021-10-23 NOTE — Progress Notes (Signed)
PROGRESS NOTE    [name] [name]  TDV:[id number] DOB: 1956/04/05 DOA: 10/14/2021 PCP: Manon Hilding, MD   Brief Narrative:  [name] [name] is a 66 y.o. male with medical history significant for chronic venous stasis, lymphedema, CVA, asbestosis, hypertension, OSA not on CPAP, who presents with concerns of worsening left ankle pain. 08/27/2021 feels he rolled his ankle when standing. 09/15/2021 seen by orthopedics, x-ray performed.  Diagnosed to have a sprain and ankle 12/28 seen by wound care at which time patient complains that he is barely able to walk. 1/27 presents to ER with worsening left leg pain and redness.  Admitted for cellulitis.  MRI ordered. 1/28 MRI positive for osteomyelitis of the ankle joint as well as hindfoot and septic arthritis.  Orthopedic consulted.   Assessment & Plan:   Principal Problem:   Osteomyelitis of ankle and foot (HCC) Active Problems:   Morbid obesity (HCC)   OSA (obstructive sleep apnea)   Essential hypertension   Chronic diastolic heart failure (HCC)   AKI (acute kidney injury) (HCC)   Anemia   Pressure ulcer   History of CVA (cerebrovascular accident)   Septic arthritis of ankle and foot region [facility])   Left leg pain  Osteomyelitis of left ankle and foot (HCC)/septic arthritis- (present on admission) Has chronic venous stasis as well as severe lymphedema. Left leg has weeping ulcers with purulent and necrotic areas. Started on broad-spectrum IV antibiotics. MRI lower extremity confirms presence of osteomyelitis of the ankle and foot area as well as septic arthritis of the multiple ankle joints.  Seen by orthopedics.  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  Has leukocytosis postoperatively but that is a stable and he is afebrile, likely reactive.  Monitor closely.  Management per orthopedics.  Upper and lower GI bleeding/gastric erosions/gastritis/ulcer at the ileocecal valve/multiple polyps in  the colon: Hemoglobin has remained stable.  S/p EGD and colonoscopy 10/19/2021.  GI recommends continuing PPI 40 mg twice daily for 8 weeks. He is cleared from GI to resume aspirin and Plavix, both resumed on 10/22/2021.  History of CVA (cerebrovascular accident): Resume aspirin and Plavix. - cont statin   Pressure ulcer- (present on admission) Stage II bilateral buttock ulcer, present on admission. Wound care consulted. Low-air-loss mattress requested. Continue dressing changes.   Pressure Injury 10/15/21 Buttocks Right;Left Stage 2 -  Partial thickness loss of dermis presenting as a shallow open injury with a red, pink wound bed without slough. pt with multiple areas of stage ii pressure injury on both sides of the buttocks, also (Active) 10/15/21 1635 Location: Buttocks Location Orientation: Right;Left Staging: Stage 2 -  Partial thickness loss of dermis presenting as a shallow open injury with a red, pink wound bed without slough. Wound Description (Comments): pt with multiple areas of stage ii pressure injury on both sides of the buttocks, also purple discoloration in area as well, sacral foam applied Present on Admission: Yes   AKI (acute kidney injury) ([place])- (present on admission) Baseline serum creatinine around 1.3.  On presentation serum creatinine 1.74.  Received IV fluids.  AKI initially resolved but then creatinine is rising again postoperatively.  Could very well be due to dehydration/low blood pressure.  We will give him IV fluids for 10 hours.  Adjust antihypertensives.  Hyperkalemia: 5.4.  Hopefully this will resolve with normal saline.  Hypertension: Patient's blood pressure has remained low normal.  I will discontinue his hydralazine and reduce Toprol-XL to 50 mg  to allow better perfusion for kidneys.   Acute on chronic diastolic heart failure (HCC)/history of asbestosis-much better now.  No more wheezes, no crackles.  Not feeling short of breath.  Will assess daily for need  of diuretics.  Continue Toprol-XL.  Morbid obesity ([place])- (present on admission) Body mass index is 50.22 kg/m.  Placing the patient at high risk for poor outcome. Not on cpap at home   T2DM Not on meds at home.  Blood sugar controlled on SSI  Disposition: PT OT initially recommended SNF.  Family wants CIR assessment.  CIR has been consulted.   DVT prophylaxis: SCD's Start: 10/21/21 1752   Code Status: Full Code  Family Communication:  None present at bedside.  Plan of care discussed with patient in length and he/she verbalized understanding and agreed with it.  Status is: Inpatient  Remains inpatient appropriate because: Needs BKA.  Will likely need SNF after that.  Estimated body mass index is 50.36 kg/m as calculated from the following:   Height as of this encounter: 5\' 10"  (1.778 m).   Weight as of this encounter: 159.2 kg.  Pressure Injury 10/15/21 Buttocks Right;Left Stage 2 -  Partial thickness loss of dermis presenting as a shallow open injury with a red, pink wound bed without slough. patchy areas of partial thickness skin loss related to moiture associated skin damage (Active)  10/15/21 1635  Location: Buttocks  Location Orientation: Right;Left  Staging: Stage 2 -  Partial thickness loss of dermis presenting as a shallow open injury with a red, pink wound bed without slough.  Wound Description (Comments): patchy areas of partial thickness skin loss related to moiture associated skin damage  Present on Admission: Yes   Nutritional Assessment: Body mass index is 50.36 kg/m.Marland Kitchen Seen by dietician.  I agree with the assessment and plan as outlined below: Nutrition Status:  . Skin Assessment: I have examined the patient's skin and I agree with the wound assessment as performed by the wound care RN as outlined below: Pressure Injury 10/15/21 Buttocks Right;Left Stage 2 -  Partial thickness loss of dermis presenting as a shallow open injury with a red, pink wound bed  without slough. patchy areas of partial thickness skin loss related to moiture associated skin damage (Active)  10/15/21 1635  Location: Buttocks  Location Orientation: Right;Left  Staging: Stage 2 -  Partial thickness loss of dermis presenting as a shallow open injury with a red, pink wound bed without slough.  Wound Description (Comments): patchy areas of partial thickness skin loss related to moiture associated skin damage  Present on Admission: Yes    Consultants:  Orthopedics GI Procedures:  As above  Antimicrobials:  Anti-infectives (From admission, onward)    Start     Dose/Rate Route Frequency Ordered Stop   10/18/21 0330  vancomycin (VANCOCIN) IVPB 1000 mg/200 mL premix        1,000 mg 200 mL/hr over 60 Minutes Intravenous Every 12 hours 10/17/21 1135 10/22/21 2359   10/16/21 1600  vancomycin (VANCOCIN) 2,500 mg in sodium chloride 0.9 % 500 mL IVPB  Status:  Discontinued        2,500 mg 250 mL/hr over 120 Minutes Intravenous Every 36 hours 10/15/21 0327 10/17/21 1135   10/16/21 1030  metroNIDAZOLE (FLAGYL) IVPB 500 mg  Status:  Discontinued        500 mg 100 mL/hr over 60 Minutes Intravenous Every 12 hours 10/16/21 0936 10/17/21 1314   10/15/21 1000  ceFEPIme (MAXIPIME) 2 g in sodium  chloride 0.9 % 100 mL IVPB        2 g 200 mL/hr over 30 Minutes Intravenous Every 8 hours 10/15/21 0327 10/22/21 2359   10/15/21 0130  vancomycin (VANCOCIN) IVPB 1000 mg/200 mL premix  Status:  Discontinued        1,000 mg 200 mL/hr over 60 Minutes Intravenous  Once 10/15/21 0117 10/15/21 0129   10/15/21 0130  ceFEPIme (MAXIPIME) 2 g in sodium chloride 0.9 % 100 mL IVPB        2 g 200 mL/hr over 30 Minutes Intravenous  Once 10/15/21 0117 10/15/21 0918   10/15/21 0130  vancomycin (VANCOREADY) IVPB 2000 mg/400 mL        2,000 mg 200 mL/hr over 120 Minutes Intravenous  Once 10/15/21 0129 10/15/21 0918         Subjective: Patient seen and examined.  He is little sleepy since he did not  sleep well last night but otherwise he has no complaints.  Appears to be dehydrated clinically.   Objective: Vitals:   10/22/21 1944 10/23/21 0409 10/23/21 0415 10/23/21 0738  BP: 97/61 (!) 91/58  122/74  Pulse: 65 67  63  Resp: 19 18  18   Temp: 98.2 F (36.8 C) 98.4 F (36.9 C)  98.3 F (36.8 C)  TempSrc: Oral Oral  Oral  SpO2: 92% 91% 94% 96%  Weight:      Height:        Intake/Output Summary (Last 24 hours) at 10/23/2021 0830 Last data filed at 10/23/2021 7001 Gross per 24 hour  Intake 560 ml  Output 975 ml  Net -415 ml    Filed Weights   10/14/21 1821 10/15/21 2159  Weight: (!) 158.8 kg (!) 159.2 kg    Examination:  General exam: Appears calm and comfortable, morbidly obese Respiratory system: Clear to auscultation. Respiratory effort normal. Cardiovascular system: S1 & S2 heard, RRR. No JVD, murmurs, rubs, gallops or clicks.  Chronic lymphedema right lower extremity. Gastrointestinal system: Abdomen is nondistended, soft and nontender. No organomegaly or masses felt. Normal bowel sounds heard. Central nervous system: Alert and oriented. No focal neurological deficits. Extremities: Left BKA with wound VAC attached. Skin: No rashes, lesions or ulcers.  Psychiatry: Judgement and insight appear normal. Mood & affect appropriate.   Data Reviewed: I have personally reviewed following labs and imaging studies  CBC: Recent Labs  Lab 10/18/21 0159 10/19/21 0251 10/20/21 0123 10/22/21 0130 10/23/21 0051  WBC 8.1 7.5 8.8 16.8* 16.0*  HGB 8.4* 9.0* 9.0* 9.0* 7.9*  HCT 27.9* 29.3* 29.5* 29.5* 25.7*  MCV 78.2* 78.8* 77.0* 79.7* 79.3*  PLT 225 217 257 268 749    Basic Metabolic Panel: Recent Labs  Lab 10/18/21 0159 10/19/21 0251 10/20/21 0123 10/22/21 0130 10/23/21 0051  NA 135 137 135 133* 132*  K 4.5 4.4 4.5 4.6 5.4*  CL 103 104 101 98 100  CO2 22 24 25 25  21*  GLUCOSE 162* 143* 167* 178* 158*  BUN 31* 23 19 26* 35*  CREATININE 1.14 1.17 1.12 1.55* 1.98*   CALCIUM 8.6* 8.8* 8.8* 8.2* 7.6*    GFR: Estimated Creatinine Clearance: 55.8 mL/min (A) (by C-G formula based on SCr of 1.98 mg/dL (H)). Liver Function Tests: No results for input(s): AST, ALT, ALKPHOS, BILITOT, PROT, ALBUMIN in the last 168 hours.  No results for input(s): LIPASE, AMYLASE in the last 168 hours. No results for input(s): AMMONIA in the last 168 hours. Coagulation Profile: No results for input(s): INR, PROTIME in  the last 168 hours. Cardiac Enzymes: No results for input(s): CKTOTAL, CKMB, CKMBINDEX, TROPONINI in the last 168 hours. BNP (last 3 results) No results for input(s): PROBNP in the last 8760 hours. HbA1C: No results for input(s): HGBA1C in the last 72 hours.  CBG: Recent Labs  Lab 10/22/21 0822 10/22/21 1137 10/22/21 1632 10/22/21 2023 10/23/21 0743  GLUCAP 145* 158* 175* 176* 138*    Lipid Profile: No results for input(s): CHOL, HDL, LDLCALC, TRIG, CHOLHDL, LDLDIRECT in the last 72 hours. Thyroid Function Tests: No results for input(s): TSH, T4TOTAL, FREET4, T3FREE, THYROIDAB in the last 72 hours. Anemia Panel: No results for input(s): VITAMINB12, FOLATE, FERRITIN, TIBC, IRON, RETICCTPCT in the last 72 hours.  Sepsis Labs: No results for input(s): PROCALCITON, LATICACIDVEN in the last 168 hours.   Recent Results (from the past 240 hour(s))  Resp Panel by RT-PCR (Flu A&B, Covid) Nasopharyngeal Swab     Status: None   Collection Time: 10/14/21 11:36 PM   Specimen: Nasopharyngeal Swab; Nasopharyngeal(NP) swabs in vial transport medium  Result Value Ref Range Status   SARS Coronavirus 2 by RT PCR NEGATIVE NEGATIVE Final    Comment: (NOTE) SARS-CoV-2 target nucleic acids are NOT DETECTED.  The SARS-CoV-2 RNA is generally detectable in upper respiratory specimens during the acute phase of infection. The lowest concentration of SARS-CoV-2 viral copies this assay can detect is 138 copies/mL. A negative result does not preclude  SARS-Cov-2 infection and should not be used as the sole basis for treatment or other patient management decisions. A negative result may occur with  improper specimen collection/handling, submission of specimen other than nasopharyngeal swab, presence of viral mutation(s) within the areas targeted by this assay, and inadequate number of viral copies(<138 copies/mL). A negative result must be combined with clinical observations, patient history, and epidemiological information. The expected result is Negative.  Fact Sheet for Patients:  EntrepreneurPulse.com.au  Fact Sheet for Healthcare Providers:  IncredibleEmployment.be  This test is no t yet approved or cleared by the [place] FDA and  has been authorized for detection and/or diagnosis of SARS-CoV-2 by FDA under an Emergency Use Authorization (EUA). This EUA will remain  in effect (meaning this test can be used) for the duration of the COVID-19 declaration under Section 564(b)(1) of the Act, 21 U.S.C.section 360bbb-3(b)(1), unless the authorization is terminated  or revoked sooner.       Influenza A by PCR NEGATIVE NEGATIVE Final   Influenza B by PCR NEGATIVE NEGATIVE Final    Comment: (NOTE) The Xpert Xpress SARS-CoV-2/FLU/RSV plus assay is intended as an aid in the diagnosis of influenza from Nasopharyngeal swab specimens and should not be used as a sole basis for treatment. Nasal washings and aspirates are unacceptable for Xpert Xpress SARS-CoV-2/FLU/RSV testing.  Fact Sheet for Patients: EntrepreneurPulse.com.au  Fact Sheet for Healthcare Providers: IncredibleEmployment.be  This test is not yet approved or cleared by the [place] FDA and has been authorized for detection and/or diagnosis of SARS-CoV-2 by FDA under an Emergency Use Authorization (EUA). This EUA will remain in effect (meaning this test can be used) for the duration of  the COVID-19 declaration under Section 564(b)(1) of the Act, 21 U.S.C. section 360bbb-3(b)(1), unless the authorization is terminated or revoked.  Performed at [facility], [place] [address] [place], [place] [address]   Surgical pcr screen     Status: Abnormal   Collection Time: 10/18/21  7:55 PM   Specimen: Nasal Mucosa; Nasal Swab  Result Value Ref Range  Status   MRSA, PCR NEGATIVE NEGATIVE Final   Staphylococcus aureus POSITIVE (A) NEGATIVE Final    Comment: (NOTE) The Xpert SA Assay (FDA approved for NASAL specimens in patients 10 years of age and older), is one component of a comprehensive surveillance program. It is not intended to diagnose infection nor to guide or monitor treatment. Performed at [facility], [place] [address], [place], [place] [address]       Radiology Studies: No results found.  Scheduled Meds:  sodium chloride   Intravenous Once   sodium chloride   Intravenous Once   allopurinol  300 mg Oral Daily   vitamin C  1,000 mg Oral Daily   aspirin EC  81 mg Oral Daily   atorvastatin  10 mg Oral Daily   clopidogrel  75 mg Oral Daily   docusate sodium  100 mg Oral BID   insulin aspart  0-15 Units Subcutaneous TID WC   insulin glargine-yfgn  10 Units Subcutaneous Daily   metoprolol succinate  50 mg Oral Daily   mupirocin ointment  1 application Nasal BID   nutrition supplement (JUVEN)  1 packet Oral BID BM   pantoprazole  40 mg Oral BID   polyethylene glycol  17 g Oral BID   zinc sulfate  220 mg Oral Daily   Continuous Infusions:  sodium chloride     sodium chloride     magnesium sulfate bolus IVPB       LOS: 8 days   Time spent: 25 minutes  Darliss Cheney, MD [facility]  10/23/2021, 8:30 AM  Please page via Amion and do not message via secure chat for urgent patient care matters. Secure chat can be used for non urgent patient care matters.  How to contact the [facility] Attending or Consulting provider [place] or  covering provider during after hours [place], for this patient?  Check the care team in [facility] and look for a) attending/consulting [facility] provider listed and b) the [facility] team listed. Page or secure chat [phone]. Log into [email or web address] and use [place]'s universal password to access. If you do not have the password, please contact the hospital operator. Locate the [facility] provider you are looking for under [facility] and page to a number that you can be directly reached. If you still have difficulty reaching the provider, please page the [facility] (Director on Call) for the Hospitalists listed on amion for assistance.

## 2021-10-24 ENCOUNTER — Inpatient Hospital Stay (HOSPITAL_COMMUNITY): Payer: Medicare Other

## 2021-10-24 ENCOUNTER — Encounter (HOSPITAL_COMMUNITY): Payer: Self-pay | Admitting: Internal Medicine

## 2021-10-24 LAB — CBC WITH DIFFERENTIAL/PLATELET
Abs Immature Granulocytes: 0.18 10*3/uL — ABNORMAL HIGH (ref 0.00–0.07)
Basophils Absolute: 0.1 10*3/uL (ref 0.0–0.1)
Basophils Relative: 1 %
Eosinophils Absolute: 0.6 10*3/uL — ABNORMAL HIGH (ref 0.0–0.5)
Eosinophils Relative: 6 %
HCT: 25.1 % — ABNORMAL LOW (ref 39.0–52.0)
Hemoglobin: 7.6 g/dL — ABNORMAL LOW (ref 13.0–17.0)
Immature Granulocytes: 2 %
Lymphocytes Relative: 7 %
Lymphs Abs: 0.7 10*3/uL (ref 0.7–4.0)
MCH: 24.3 pg — ABNORMAL LOW (ref 26.0–34.0)
MCHC: 30.3 g/dL (ref 30.0–36.0)
MCV: 80.2 fL (ref 80.0–100.0)
Monocytes Absolute: 1 10*3/uL (ref 0.1–1.0)
Monocytes Relative: 10 %
Neutro Abs: 7.5 10*3/uL (ref 1.7–7.7)
Neutrophils Relative %: 74 %
Platelets: 242 10*3/uL (ref 150–400)
RBC: 3.13 MIL/uL — ABNORMAL LOW (ref 4.22–5.81)
RDW: 19.9 % — ABNORMAL HIGH (ref 11.5–15.5)
WBC: 10 10*3/uL (ref 4.0–10.5)
nRBC: 0 % (ref 0.0–0.2)

## 2021-10-24 LAB — BPAM RBC
Blood Product Expiration Date: 202302222359
Blood Product Expiration Date: 202302222359
Blood Product Expiration Date: 202302222359
ISSUE DATE / TIME: 202302031439
Unit Type and Rh: 7300
Unit Type and Rh: 7300
Unit Type and Rh: 7300

## 2021-10-24 LAB — TYPE AND SCREEN
ABO/RH(D): B POS
Antibody Screen: NEGATIVE
Unit division: 0
Unit division: 0
Unit division: 0

## 2021-10-24 LAB — GLUCOSE, CAPILLARY
Glucose-Capillary: 193 mg/dL — ABNORMAL HIGH (ref 70–99)
Glucose-Capillary: 160 mg/dL — ABNORMAL HIGH (ref 70–99)
Glucose-Capillary: 152 mg/dL — ABNORMAL HIGH (ref 70–99)
Glucose-Capillary: 142 mg/dL — ABNORMAL HIGH (ref 70–99)

## 2021-10-24 LAB — BASIC METABOLIC PANEL
Anion gap: 8 (ref 5–15)
BUN: 40 mg/dL — ABNORMAL HIGH (ref 8–23)
CO2: 25 mmol/L (ref 22–32)
Calcium: 8 mg/dL — ABNORMAL LOW (ref 8.9–10.3)
Chloride: 101 mmol/L (ref 98–111)
Creatinine, Ser: 2.37 mg/dL — ABNORMAL HIGH (ref 0.61–1.24)
GFR, Estimated: 29 mL/min — ABNORMAL LOW (ref >60)
Glucose, Bld: 162 mg/dL — ABNORMAL HIGH (ref 70–99)
Potassium: 4.4 mmol/L (ref 3.5–5.1)
Sodium: 134 mmol/L — ABNORMAL LOW (ref 135–145)

## 2021-10-24 MED ORDER — METOPROLOL SUCCINATE ER 25 MG PO TB24
25.0000 mg | ORAL_TABLET | Freq: Every day | ORAL | Status: DC
  Administered 2021-10-25: 25 mg via ORAL
  Filled 2021-10-24: qty 1

## 2021-10-24 MED ORDER — SODIUM CHLORIDE 0.9 % IV SOLN: INTRAVENOUS | Status: DC

## 2021-10-24 MED ORDER — DIPHENHYDRAMINE HCL 50 MG/ML IJ SOLN
12.5000 mg | Freq: Three times a day (TID) | INTRAMUSCULAR | Status: DC | PRN
  Administered 2021-10-24: 12.5 mg via INTRAVENOUS
  Filled 2021-10-24: qty 1

## 2021-10-24 NOTE — Anesthesia Postprocedure Evaluation (Signed)
Anesthesia Post Note  Patient: [name] [name]  Procedure(s) Performed: LEFT BELOW KNEE AMPUTATION (Left: Knee)     Patient location during evaluation: PACU Anesthesia Type: Regional Level of consciousness: awake and alert Pain management: pain level controlled Vital Signs Assessment: post-procedure vital signs reviewed and stable Respiratory status: spontaneous breathing, nonlabored ventilation, respiratory function stable and patient connected to nasal cannula oxygen Cardiovascular status: stable and blood pressure returned to baseline Postop Assessment: no apparent nausea or vomiting Anesthetic complications: no   No notable events documented.  Last Vitals:  Vitals:   10/24/21 0751 10/24/21 1545  BP: 119/63 119/61  Pulse: 72 64  Resp: 17 16  Temp: 37 C 37 C  SpO2: 96% 97%    Last Pain:  Vitals:   10/24/21 1545  TempSrc: Oral  PainSc:    Pain Goal: Patients Stated Pain Goal: 2 (10/23/21 2026)                 Barnet Glasgow

## 2021-10-24 NOTE — H&P (Signed)
Physical Medicine and Rehabilitation Admission H&P    Chief Complaint  Patient presents with   Leg Pain  : HPI: [name] [name] is a 66 year old right-handed male with history of CVA maintained on aspirin and Plavix, asbestosis, hypertension, type 2 diabetes mellitus, OSA not on CPAP, obesity with BMI 40.10, diastolic congestive heart failure, chronic venous stasis and lymphedema of bilateral lower extremities followed by wound care.  Per chart review patient lives alone.  He does have a personal care attendant as well as good support from his daughter and sister.  PCA assist with ADLs.  Patient was using a cane for short distances but mostly wheelchair-bound.  1 level home with ramped entrance.  Presented 10/14/2021 with persistent left ankle pain since he twisted his ankle getting into his car approximately 1 month ago.  He was initially referred to [facility] x-rays negative for fracture.  Patient notes extensive edema venous stasis ulceration with purulent drainage and necrotic ulcers of left lower extremity.  Admission chemistries glucose 151 BUN 71 creatinine 1.74, WBC 12,000 hemoglobin 9.4 down from recent 13.3, lactic acid 1.4 FOBT positive.  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  Septic arthritis of the tibiotalar, subtalar and talonavicular joints.  Diffuse soft tissue swelling and subcutaneous edema.  Gastroenterology services consulted for rectal bleeding FOBT positive.  Patient underwent colonoscopy/EGD 10/19/2021 per Dr. Lorenso Courier that examined portion of ileum was normal there was a single solitary ulcer at the ileocecal valve.  Seven 3 to 7 mm polyps in the sigmoid colon and descending colon removed with a cold snare.  Nonbleeding internal hemorrhoids.  Patient was placed on PPI twice daily x8 weeks.  Patient's aspirin and Plavix initially held and has since been resumed 10/22/2021 with latest hemoglobin 7.9.  Follow-up orthopedic services Dr.  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  Noted AKI baseline serum creatinine around 1.3 presentation 1.74-1.98-2.37-2.08 and did receive IV fluids x10 hours and monitored.  Blood pressure was somewhat soft hydralazine discontinued Toprol reduced to 50 mg to allow better perfusion for kidneys.  Therapy evaluations completed and due to patient decreased functional mobility was admitted for a comprehensive rehab program.  Pt reports R hand swelling since surgery-  Pain meds making him itch- but doesn't want to change them- says it's "not bad"- just need to scratch his back.  O2 is new- 2L- never had at home.  LBM 2 days ago- usually goes 1x/week.  Peeing OK.  Is R handed, but uses L hand a lot since stroke which affected his R side.    Review of Systems  Constitutional:  Positive for malaise/fatigue. Negative for chills and fever.  HENT:  Negative for hearing loss.   Eyes:  Negative for blurred vision and double vision.  Respiratory:  Negative for cough.        Shortness of breath with exertion  Cardiovascular:  Positive for leg swelling. Negative for chest pain and palpitations.  Gastrointestinal:  Positive for blood in stool and constipation. Negative for nausea and vomiting.  Genitourinary:  Negative for dysuria, flank pain and hematuria.  Musculoskeletal:  Positive for joint pain and myalgias.  Skin:  Negative for rash.  All other systems reviewed and are negative. 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[facility])    Essential hypertension    History of nephrolithiasis    History of open leg wound    History of stroke July 2004   Obstructive sleep apnea    TIA (transient ischemic attack)    Varicose veins    Laser ablation of left  great saphenous vein 2009   Past Surgical History:  Procedure Laterality Date   CYSTOSCOPY WITH RETROGRADE PYELOGRAM, URETEROSCOPY AND STENT PLACEMENT Right 08/02/2015   Procedure: [place], URETEROSCOPY , STONE EXTRACTION AND STENT PLACEMENT;  Surgeon: Cleon Gustin, MD;  Location: [facility];  Service: Urology;  Laterality: Right;   HOLMIUM LASER APPLICATION Right 43/32/9518   Procedure: HOLMIUM LASER APPLICATION;  Surgeon: Cleon Gustin, MD;  Location: [facility];  Service: Urology;  Laterality: Right;   KIDNEY STONE SURGERY     VASECTOMY  1984   VEIN SURGERY Left 06/2010   Laser ablation   Family History  Problem Relation Age of Onset   Bone cancer Mother    Hypertension Father    Pancreatic cancer Maternal Grandmother    Stroke Paternal Grandmother    Diabetes Paternal Grandfather    Heart attack Brother    Hypertension Brother    Heart attack Sister    Social History:  reports that he has never smoked. He has never used smokeless tobacco. He reports that he does not drink alcohol and does not use drugs. Allergies: No Known Allergies Medications Prior to Admission  Medication Sig Dispense Refill   acetaminophen (TYLENOL) 500 MG tablet Take 1,000 mg by mouth daily as needed for moderate pain.     allopurinol (ZYLOPRIM) 300 MG tablet Take 300 mg by mouth daily.     aspirin 325 MG tablet Take 325 mg by mouth daily.     atorvastatin (LIPITOR) 10 MG tablet Take 10 mg by mouth daily.     clopidogrel (PLAVIX) 75 MG tablet Take 75 mg by mouth daily.     diclofenac (VOLTAREN) 75 MG EC tablet Take 75 mg by mouth 2 (two) times daily.     furosemide (LASIX) 40 MG tablet Take 80 mg by mouth daily as needed for fluid or edema.     hydrALAZINE (APRESOLINE) 50 MG tablet TAKE 1 TABLET BY MOUTH TWICE A DAY. NEED OFFICE VISIT FOR FURTHER REFILLS (Patient taking differently: Take 50-100 mg by mouth 2 (two) times daily. Increased by Dr. Quintin Alto) 14 tablet 0    metoprolol succinate (TOPROL-XL) 100 MG 24 hr tablet Take 100 mg by mouth daily. Take with or immediately following a meal.     olmesartan-hydrochlorothiazide (BENICAR HCT) 40-25 MG tablet Take 1 tablet by mouth daily.  12   aspirin EC 81 MG tablet Take 1 tablet (81 mg total) by mouth daily. (Patient not taking: Reported on 04/09/2021) 90 tablet 3      Home: [place] expects to be discharged to:: Private residence Living Arrangements: Alone Available Help at Discharge: Neighbor, Friend(s), Family, Other (Comment), Personal care attendant Type of Home: House Home Access: Ramped entrance [place]: One level Bathroom Shower/Tub: Holiday representative Accessibility: No Home Equipment: BSC/3in1, Wheelchair - power, Conservation officer, nature (2 wheels) Additional Comments: sleeps in lift chair   Functional History: Prior Function Prior Level of Function : Needs assist Mobility Comments: primarily uses power chiar ADLs Comments: PCA does ADLs, reports sponge bathing at baseline since December  Functional Status:  Mobility: Bed Mobility Overal bed mobility: Needs Assistance Bed  Mobility: Supine to Sit, Sit to Supine Supine to sit: Mod assist, +2 for physical assistance, +2 for safety/equipment Sit to supine: Mod assist, +2 for physical assistance, +2 for safety/equipment General bed mobility comments: Mod A +2 to pull to sitting and bring BLE off the bed Transfers Overall transfer level: Needs assistance Transfers: Bed to chair/wheelchair/BSC Bed to/from chair/wheelchair/BSC transfer type:: Lateral/scoot transfer  Lateral/Scoot Transfers: Mod assist General transfer comment: Deferred due to pt weakness and fatigue Ambulation/Gait General Gait Details: unable to try    ADL: ADL Overall ADL's : Needs assistance/impaired Eating/Feeding: Set up, Sitting Grooming: Set up, Sitting Grooming Details (indicate cue type and reason): pt completed shaving task sitting EOB > 20  min, some assist for throughness and due to small mirror/lower lighting Upper Body Bathing: Moderate assistance, Sitting Upper Body Bathing Details (indicate cue type and reason): to wash back sitting EOB Lower Body Bathing: Maximal assistance, +2 for safety/equipment, +2 for physical assistance, Bed level Upper Body Dressing : Minimal assistance, Sitting Upper Body Dressing Details (indicate cue type and reason): able to doff/don gown Lower Body Dressing: Total assistance, Bed level Lower Body Dressing Details (indicate cue type and reason): to don R sock Toilet Transfer: Total assistance, +2 for physical assistance, +2 for safety/equipment Toileting- Clothing Manipulation and Hygiene: Total assistance, +2 for physical assistance, +2 for safety/equipment, Bed level Functional mobility during ADLs: Moderate assistance, Maximal assistance, +2 for physical assistance General ADL Comments: Pt re-eval s/p BKA this session, continues to require increased assist for all ADL's, unable to stand this session due to weakness and pain  Cognition: Cognition Overall Cognitive Status: Within Functional Limits for tasks assessed Orientation Level: Oriented X4 Cognition Arousal/Alertness: Awake/alert Behavior During Therapy: WFL for tasks assessed/performed, Flat affect Overall Cognitive Status: Within Functional Limits for tasks assessed General Comments: slow speech but appropriate  Physical Exam: Blood pressure 105/65, pulse 69, temperature 98.4 F (36.9 C), temperature source Oral, resp. rate 19, height 5\' 10"  (1.778 m), weight (!) 159.2 kg, SpO2 95 %. Physical Exam Vitals and nursing note reviewed.  Constitutional:      Comments: Pt sitting up in bariatric low loss air mattress bed; computer in front of him; NAD- sparse speech  HENT:     Head: Normocephalic and atraumatic.     Comments: Missing many teeth- poor dentition    Right Ear: External ear normal.     Left Ear: External ear normal.      Nose: No congestion.     Mouth/Throat:     Mouth: Mucous membranes are dry.     Pharynx: Oropharynx is clear. No oropharyngeal exudate.  Eyes:     General:        Right eye: No discharge.        Left eye: No discharge.     Extraocular Movements: Extraocular movements intact.  Cardiovascular:     Rate and Rhythm: Normal rate and regular rhythm.     Heart sounds: Normal heart sounds. No murmur heard.   No gallop.  Pulmonary:     Comments: Decreased breathing sounds- no W/R/R- - esp at bases-  Abdominal:     Comments: Protuberant vs distended; hypoactive BS; soft, NT  Genitourinary:    Comments: Swollen scrotum- severely Musculoskeletal:     Cervical back: Neck supple. No tenderness.     Comments: LUE 5/5 RUE- 4+/5 in same muscles LLE- 2/5 in HF/KE- has wound VAC in place on L BKA RLE- HF/KE/KF 2/5; DF and PF 4-/5  Skin:    General: Skin is warm.     Comments: Left BKA is dressed with wound VAC in place appropriately tender.  Significant lymphedema right lower extremity with wound dressing in place  Scrotum really swollen- MASD on buttocks- no pressure ulcers R calf- lympheedema- bleeding when aqualcel removed Dorsum of R foot- some wrinkling like fluid lost, but severely swollen on top of R foot- can barely see toes around it.  Very crusty R hand mod-severe swelling- not in L hand IV R upper forearm doesn't look infiltrated  Neurological:     Comments: Patient is a bit lethargic but arousable.  Makes eye contact with examiner.  Provides name and age but limited medical historian.  Pt delayed responses- scant speech, but answer questions- limited Decreased sensation in stocking pattern  Psychiatric:     Comments: Flat; sparse speech    Results for orders placed or performed during the hospital encounter of 10/14/21 (from the past 48 hour(s))  Glucose, capillary     Status: Abnormal   Collection Time: 10/22/21  8:22 AM  Result Value Ref Range   Glucose-Capillary 145  (H) 70 - 99 mg/dL    Comment: Glucose reference range applies only to samples taken after fasting for at least 8 hours.  Glucose, capillary     Status: Abnormal   Collection Time: 10/22/21 11:37 AM  Result Value Ref Range   Glucose-Capillary 158 (H) 70 - 99 mg/dL    Comment: Glucose reference range applies only to samples taken after fasting for at least 8 hours.  Glucose, capillary     Status: Abnormal   Collection Time: 10/22/21  4:32 PM  Result Value Ref Range   Glucose-Capillary 175 (H) 70 - 99 mg/dL    Comment: Glucose reference range applies only to samples taken after fasting for at least 8 hours.  Glucose, capillary     Status: Abnormal   Collection Time: 10/22/21  8:23 PM  Result Value Ref Range   Glucose-Capillary 176 (H) 70 - 99 mg/dL    Comment: Glucose reference range applies only to samples taken after fasting for at least 8 hours.  Basic metabolic panel     Status: Abnormal   Collection Time: 10/23/21 12:51 AM  Result Value Ref Range   Sodium 132 (L) 135 - 145 mmol/L   Potassium 5.4 (H) 3.5 - 5.1 mmol/L   Chloride 100 98 - 111 mmol/L   CO2 21 (L) 22 - 32 mmol/L   Glucose, Bld 158 (H) 70 - 99 mg/dL    Comment: Glucose reference range applies only to samples taken after fasting for at least 8 hours.   BUN 35 (H) 8 - 23 mg/dL   Creatinine, Ser 1.98 (H) 0.61 - 1.24 mg/dL   Calcium 7.6 (L) 8.9 - 10.3 mg/dL   GFR, Estimated 37 (L) >60 mL/min    Comment: (NOTE) Calculated using the CKD-EPI Creatinine Equation (2021)    Anion gap 11 5 - 15    Comment: Performed at [facility] [address], [place],  [address]  CBC     Status: Abnormal   Collection Time: 10/23/21 12:51 AM  Result Value Ref Range   WBC 16.0 (H) 4.0 - 10.5 K/uL   RBC 3.24 (L) 4.22 - 5.81 MIL/uL   Hemoglobin 7.9 (L) 13.0 - 17.0 g/dL   HCT 25.7 (L) 39.0 - 52.0 %   MCV 79.3 (L) 80.0 - 100.0 fL   MCH 24.4 (L) 26.0 - 34.0  pg   MCHC 30.7 30.0 - 36.0 g/dL   RDW 19.6 (H) 11.5 - 15.5 %    Platelets 226 150 - 400 K/uL   nRBC 0.6 (H) 0.0 - 0.2 %    Comment: Performed at [facility] [address] [place], [place] [address]  Glucose, capillary     Status: Abnormal   Collection Time: 10/23/21  7:43 AM  Result Value Ref Range   Glucose-Capillary 138 (H) 70 - 99 mg/dL    Comment: Glucose reference range applies only to samples taken after fasting for at least 8 hours.  Glucose, capillary     Status: Abnormal   Collection Time: 10/23/21 11:42 AM  Result Value Ref Range   Glucose-Capillary 162 (H) 70 - 99 mg/dL    Comment: Glucose reference range applies only to samples taken after fasting for at least 8 hours.  Glucose, capillary     Status: Abnormal   Collection Time: 10/23/21  5:43 PM  Result Value Ref Range   Glucose-Capillary 161 (H) 70 - 99 mg/dL    Comment: Glucose reference range applies only to samples taken after fasting for at least 8 hours.  Glucose, capillary     Status: Abnormal   Collection Time: 10/23/21  7:30 PM  Result Value Ref Range   Glucose-Capillary 169 (H) 70 - 99 mg/dL    Comment: Glucose reference range applies only to samples taken after fasting for at least 8 hours.   No results found.    Blood pressure 105/65, pulse 69, temperature 98.4 F (36.9 C), temperature source Oral, resp. rate 19, height 5\' 10"  (1.778 m), weight (!) 159.2 kg, SpO2 95 %.  Medical Problem List and Plan: 1. 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  Antithrombotics: -DVT/anticoagulation:  Mechanical: Antiembolism stockings, thigh (TED hose) Right lower extremity  -antiplatelet therapy: Aspirin 81 mg daily and Plavix 75 mg daily 3. Pain Management: Hydrocodone as needed 4. Mood: Provide emotional support  -antipsychotic agents: N/A 5. Neuropsych: This patient is capable of making decisions on his own behalf. 6. Skin/Wound Care: Routine skin  checks.  MASD on buttocks and lymphedema/venous stasis ulcers on RLE- was told Pressure ulcer on buttocks, but not per WOC or nursing.   WOC follow-up wound care as directed. 7. Fluids/Electrolytes/Nutrition: Routine in and outs with follow-up chemistries 8.  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  Status post EGD and colonoscopy 10/19/2021.  Continue PPI x8 weeks. 9.  Acute blood loss anemia.  Follow-up CBC 10.  History of CVA.  Aspirin Plavix as prior to admission. 11.  AKI.  Baseline creatinine 1.3.   IV fluids until follow up labs AM.  Follow-up chemistries 12.  Hypertension.  Blood pressure somewhat soft with hydralazine discontinued Toprol decreased to 25 mg daily to help perfusion for kidneys. 13.  Diabetes mellitus.  Hemoglobin A1c 7.3.  Semglee initiated while in the hospital 10 units daily.  Check blood sugars before meals and at bedtime 14.  Morbid obesity.  BMI 50.22.  Dietary follow-up 15.  Hyperlipidemia.  Lipitor 16.  History of gout.  Zyloprim 300 mg daily.  Monitor for any gout flareups 17.  Acute on chronic diastolic congestive heart failure.  Monitor for any signs of fluid overload. 18. R hand swelling- suggest K tape for R hand 19. Scrotal  swelling- severe- suggested to nurse to elevate on towel- might need scrotal sling, but usually hard to get in to.    I have personally performed a face to face diagnostic evaluation of this patient and formulated the key components of the plan.  Additionally, I have personally reviewed laboratory data, imaging studies, as well as relevant notes and concur with the physician assistant's documentation above.   The patient's status has not changed from the original H&P.  Any changes in documentation from the acute care chart have been noted above.      Lavon Paganini Angiulli, PA-C 10/24/2021

## 2021-10-24 NOTE — Progress Notes (Signed)
Inpatient Rehab Admissions Coordinator:   I do not have a CIR bed for this Pt. Today. Acute MD states concern that creatinine is trending up. I will follow up tomorrow for potential admission.  Clemens Catholic, [place], [place] Admissions Coordinator  [phone] ([place]) [phone] (office)

## 2021-10-24 NOTE — Progress Notes (Signed)
PROGRESS NOTE    [name] [name]  EQA:[id number] DOB: 04/28/1956 DOA: 10/14/2021 PCP: Manon Hilding, MD   Brief Narrative:  [name] [name] is a 66 y.o. male with medical history significant for chronic venous stasis, lymphedema, CVA, asbestosis, hypertension, OSA not on CPAP, who presents with concerns of worsening left ankle pain. 08/27/2021 feels he rolled his ankle when standing. 09/15/2021 seen by orthopedics, x-ray performed.  Diagnosed to have a sprain and ankle 12/28 seen by wound care at which time patient complains that he is barely able to walk. 1/27 presents to ER with worsening left leg pain and redness.  Admitted for cellulitis.  MRI ordered. 1/28 MRI positive for osteomyelitis of the ankle joint as well as hindfoot and septic arthritis.  Orthopedic consulted.   Assessment & Plan:   Principal Problem:   Osteomyelitis of ankle and foot (HCC) Active Problems:   Morbid obesity (HCC)   OSA (obstructive sleep apnea)   Essential hypertension   Chronic diastolic heart failure (HCC)   AKI (acute kidney injury) (HCC)   Anemia   Pressure ulcer   History of CVA (cerebrovascular accident)   Septic arthritis of ankle and foot region [facility])   Left leg pain  Osteomyelitis of left ankle and foot (HCC)/septic arthritis- (present on admission) Has chronic venous stasis as well as severe lymphedema. Left leg has weeping ulcers with purulent and necrotic areas. Started on broad-spectrum IV antibiotics. MRI lower extremity confirms presence of osteomyelitis of the ankle and foot area as well as septic arthritis of the multiple ankle joints.  Seen by orthopedics.  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  Has leukocytosis postoperatively but that is a stable and he is afebrile, likely reactive.  We will repeat labs today.  Management per orthopedics.  Upper and lower GI bleeding/gastric erosions/gastritis/ulcer at the ileocecal valve/multiple  polyps in the colon: Hemoglobin has remained stable.  S/p EGD and colonoscopy 10/19/2021.  GI recommends continuing PPI 40 mg twice daily for 8 weeks. He is cleared from GI to resume aspirin and Plavix, both resumed on 10/22/2021.  History of CVA (cerebrovascular accident): continueContinue aspirin and Plavix. - cont statin   Pressure ulcer- (present on admission) Stage II bilateral buttock ulcer, present on admission. Wound care consulted. Low-air-loss mattress requested. Continue dressing changes.   Pressure Injury 10/15/21 Buttocks Right;Left Stage 2 -  Partial thickness loss of dermis presenting as a shallow open injury with a red, pink wound bed without slough. pt with multiple areas of stage ii pressure injury on both sides of the buttocks, also (Active) 10/15/21 1635 Location: Buttocks Location Orientation: Right;Left Staging: Stage 2 -  Partial thickness loss of dermis presenting as a shallow open injury with a red, pink wound bed without slough. Wound Description (Comments): pt with multiple areas of stage ii pressure injury on both sides of the buttocks, also purple discoloration in area as well, sacral foam applied Present on Admission: Yes   AKI (acute kidney injury) ([place])- (present on admission) Baseline serum creatinine around 1.3.  On presentation serum creatinine 1.74.  Received IV fluids.  AKI initially resolved but then creatinine is rising again postoperatively.  Further rise in creatinine today.  Urine output appears to be decent.  I will initiate the patient on normal saline at 100 cc/h and check renal ultrasound.  If no improvement in creatinine by tomorrow, will consult nephrology.  Hyperkalemia: Resolved.  Hypertension: Patient's blood pressure has remained low normal.  hydralazine was discontinued and Toprol-XL was reduced to 50 mg to allow better perfusion for kidneys.  I am further reducing the Toprol-XL to 25 mg a day.   Acute on chronic diastolic heart failure  (HCC)/history of asbestosis-much better now.  No more wheezes, no crackles.  Not feeling short of breath.  Will assess daily for need of diuretics.  Continue Toprol-XL.  Morbid obesity ([place])- (present on admission) Body mass index is 50.22 kg/m.  Placing the patient at high risk for poor outcome. Not on cpap at home   T2DM Not on meds at home.  Blood sugar controlled on SSI  Disposition: CIR.  Bradycardia now available but patient is not medically stable today due to rising creatinine.   DVT prophylaxis: SCD's Start: 10/21/21 1752   Code Status: Full Code  Family Communication:  None present at bedside.  Plan of care discussed with patient in length and he/she verbalized understanding and agreed with it.  Status is: Inpatient  Remains inpatient appropriate because: Has AKI again.  Estimated body mass index is 50.36 kg/m as calculated from the following:   Height as of this encounter: 5\' 10"  (1.778 m).   Weight as of this encounter: 159.2 kg.  Pressure Injury 10/15/21 Buttocks Right;Left Stage 2 -  Partial thickness loss of dermis presenting as a shallow open injury with a red, pink wound bed without slough. patchy areas of partial thickness skin loss related to moiture associated skin damage (Active)  10/15/21 1635  Location: Buttocks  Location Orientation: Right;Left  Staging: Stage 2 -  Partial thickness loss of dermis presenting as a shallow open injury with a red, pink wound bed without slough.  Wound Description (Comments): patchy areas of partial thickness skin loss related to moiture associated skin damage  Present on Admission: Yes   Nutritional Assessment: Body mass index is 50.36 kg/m.Mar[place] Kitchen Seen by dietician.  I agree with the assessment and plan as outlined below: Nutrition Status:  . Skin Assessment: I have examined the patient's skin and I agree with the wound assessment as performed by the wound care RN as outlined below: Pressure Injury 10/15/21 Buttocks  Right;Left Stage 2 -  Partial thickness loss of dermis presenting as a shallow open injury with a red, pink wound bed without slough. patchy areas of partial thickness skin loss related to moiture associated skin damage (Active)  10/15/21 1635  Location: Buttocks  Location Orientation: Right;Left  Staging: Stage 2 -  Partial thickness loss of dermis presenting as a shallow open injury with a red, pink wound bed without slough.  Wound Description (Comments): patchy areas of partial thickness skin loss related to moiture associated skin damage  Present on Admission: Yes    Consultants:  Orthopedics GI Procedures:  As above  Antimicrobials:  Anti-infectives (From admission, onward)    Start     Dose/Rate Route Frequency Ordered Stop   10/18/21 0330  vancomycin (VANCOCIN) IVPB 1000 mg/200 mL premix        1,000 mg 200 mL/hr over 60 Minutes Intravenous Every 12 hours 10/17/21 1135 10/22/21 2359   10/16/21 1600  vancomycin (VANCOCIN) 2,500 mg in sodium chloride 0.9 % 500 mL IVPB  Status:  Discontinued        2,500 mg 250 mL/hr over 120 Minutes Intravenous Every 36 hours 10/15/21 0327 10/17/21 1135   10/16/21 1030  metroNIDAZOLE (FLAGYL) IVPB 500 mg  Status:  Discontinued        500 mg 100 mL/hr over 60 Minutes Intravenous Every  12 hours 10/16/21 0936 10/17/21 1314   10/15/21 1000  ceFEPIme (MAXIPIME) 2 g in sodium chloride 0.9 % 100 mL IVPB        2 g 200 mL/hr over 30 Minutes Intravenous Every 8 hours 10/15/21 0327 10/22/21 2359   10/15/21 0130  vancomycin (VANCOCIN) IVPB 1000 mg/200 mL premix  Status:  Discontinued        1,000 mg 200 mL/hr over 60 Minutes Intravenous  Once 10/15/21 0117 10/15/21 0129   10/15/21 0130  ceFEPIme (MAXIPIME) 2 g in sodium chloride 0.9 % 100 mL IVPB        2 g 200 mL/hr over 30 Minutes Intravenous  Once 10/15/21 0117 10/15/21 0918   10/15/21 0130  vancomycin (VANCOREADY) IVPB 2000 mg/400 mL        2,000 mg 200 mL/hr over 120 Minutes Intravenous  Once  10/15/21 0129 10/15/21 0918         Subjective: Patient seen and examined.  He was slightly sleepy but fairly alert and able to hold conversation and he was oriented as well.  He had no complaints.  Objective: Vitals:   10/23/21 1611 10/23/21 1929 10/24/21 0340 10/24/21 0751  BP: (!) 116/57 108/60 105/65 119/63  Pulse: 61 66 69 72  Resp: 18 20 19 17   Temp: 97.8 F (36.6 C) 98.4 F (36.9 C) 98.4 F (36.9 C) 98.6 F (37 C)  TempSrc:   Oral Oral  SpO2: (!) 79% 92% 95% 96%  Weight:      Height:        Intake/Output Summary (Last 24 hours) at 10/24/2021 1238 Last data filed at 10/24/2021 1110 Gross per 24 hour  Intake 900 ml  Output 2110 ml  Net -1210 ml   Filed Weights   10/14/21 1821 10/15/21 2159  Weight: (!) 158.8 kg (!) 159.2 kg    Examination:  General exam: Appears calm and comfortable, morbidly obese Respiratory system: Clear to auscultation. Respiratory effort normal. Cardiovascular system: S1 & S2 heard, RRR. No JVD, murmurs, rubs, gallops or clicks.  Lymphedema right lower extremity. Gastrointestinal system: Abdomen is nondistended, soft and nontender. No organomegaly or masses felt. Normal bowel sounds heard. Central nervous system: Alert and oriented. No focal neurological deficits. Extremities: Left BKA with wound VAC attached. Skin: No rashes, lesions or ulcers.  Psychiatry: Judgement and insight appear normal. Mood & affect appropriate.    Data Reviewed: I have personally reviewed following labs and imaging studies  CBC: Recent Labs  Lab 10/18/21 0159 10/19/21 0251 10/20/21 0123 10/22/21 0130 10/23/21 0051  WBC 8.1 7.5 8.8 16.8* 16.0*  HGB 8.4* 9.0* 9.0* 9.0* 7.9*  HCT 27.9* 29.3* 29.5* 29.5* 25.7*  MCV 78.2* 78.8* 77.0* 79.7* 79.3*  PLT 225 217 257 268 742   Basic Metabolic Panel: Recent Labs  Lab 10/19/21 0251 10/20/21 0123 10/22/21 0130 10/23/21 0051 10/24/21 1055  NA 137 135 133* 132* 134*  K 4.4 4.5 4.6 5.4* 4.4  CL 104 101 98  100 101  CO2 24 25 25  21* 25  GLUCOSE 143* 167* 178* 158* 162*  BUN 23 19 26* 35* 40*  CREATININE 1.17 1.12 1.55* 1.98* 2.37*  CALCIUM 8.8* 8.8* 8.2* 7.6* 8.0*   GFR: Estimated Creatinine Clearance: 46.6 mL/min (A) (by C-G formula based on SCr of 2.37 mg/dL (H)). Liver Function Tests: No results for input(s): AST, ALT, ALKPHOS, BILITOT, PROT, ALBUMIN in the last 168 hours.  No results for input(s): LIPASE, AMYLASE in the last 168 hours. No results for  input(s): AMMONIA in the last 168 hours. Coagulation Profile: No results for input(s): INR, PROTIME in the last 168 hours. Cardiac Enzymes: No results for input(s): CKTOTAL, CKMB, CKMBINDEX, TROPONINI in the last 168 hours. BNP (last 3 results) No results for input(s): PROBNP in the last 8760 hours. HbA1C: No results for input(s): HGBA1C in the last 72 hours.  CBG: Recent Labs  Lab 10/23/21 1142 10/23/21 1743 10/23/21 1930 10/24/21 0750 10/24/21 1146  GLUCAP 162* 161* 169* 193* 160*   Lipid Profile: No results for input(s): CHOL, HDL, LDLCALC, TRIG, CHOLHDL, LDLDIRECT in the last 72 hours. Thyroid Function Tests: No results for input(s): TSH, T4TOTAL, FREET4, T3FREE, THYROIDAB in the last 72 hours. Anemia Panel: No results for input(s): VITAMINB12, FOLATE, FERRITIN, TIBC, IRON, RETICCTPCT in the last 72 hours.  Sepsis Labs: No results for input(s): PROCALCITON, LATICACIDVEN in the last 168 hours.   Recent Results (from the past 240 hour(s))  Resp Panel by RT-PCR (Flu A&B, Covid) Nasopharyngeal Swab     Status: None   Collection Time: 10/14/21 11:36 PM   Specimen: Nasopharyngeal Swab; Nasopharyngeal(NP) swabs in vial transport medium  Result Value Ref Range Status   SARS Coronavirus 2 by RT PCR NEGATIVE NEGATIVE Final    Comment: (NOTE) SARS-CoV-2 target nucleic acids are NOT DETECTED.  The SARS-CoV-2 RNA is generally detectable in upper respiratory specimens during the acute phase of infection. The  lowest concentration of SARS-CoV-2 viral copies this assay can detect is 138 copies/mL. A negative result does not preclude SARS-Cov-2 infection and should not be used as the sole basis for treatment or other patient management decisions. A negative result may occur with  improper specimen collection/handling, submission of specimen other than nasopharyngeal swab, presence of viral mutation(s) within the areas targeted by this assay, and inadequate number of viral copies(<138 copies/mL). A negative result must be combined with clinical observations, patient history, and epidemiological information. The expected result is Negative.  Fact Sheet for Patients:  EntrepreneurPulse.com.au  Fact Sheet for Healthcare Providers:  IncredibleEmployment.be  This test is no t yet approved or cleared by the [place] FDA and  has been authorized for detection and/or diagnosis of SARS-CoV-2 by FDA under an Emergency Use Authorization (EUA). This EUA will remain  in effect (meaning this test can be used) for the duration of the COVID-19 declaration under Section 564(b)(1) of the Act, 21 U.S.C.section 360bbb-3(b)(1), unless the authorization is terminated  or revoked sooner.       Influenza A by PCR NEGATIVE NEGATIVE Final   Influenza B by PCR NEGATIVE NEGATIVE Final    Comment: (NOTE) The Xpert Xpress SARS-CoV-2/FLU/RSV plus assay is intended as an aid in the diagnosis of influenza from Nasopharyngeal swab specimens and should not be used as a sole basis for treatment. Nasal washings and aspirates are unacceptable for Xpert Xpress SARS-CoV-2/FLU/RSV testing.  Fact Sheet for Patients: EntrepreneurPulse.com.au  Fact Sheet for Healthcare Providers: IncredibleEmployment.be  This test is not yet approved or cleared by the [place] FDA and has been authorized for detection and/or diagnosis of SARS-CoV-2 by FDA under  an Emergency Use Authorization (EUA). This EUA will remain in effect (meaning this test can be used) for the duration of the COVID-19 declaration under Section 564(b)(1) of the Act, 21 U.S.C. section 360bbb-3(b)(1), unless the authorization is terminated or revoked.  Performed at [facility], [place] [address] [place], [place] [address]   Surgical pcr screen     Status: Abnormal   Collection Time: 10/18/21  7:55 PM   Specimen: Nasal Mucosa; Nasal Swab  Result Value Ref Range Status   MRSA, PCR NEGATIVE NEGATIVE Final   Staphylococcus aureus POSITIVE (A) NEGATIVE Final    Comment: (NOTE) The Xpert SA Assay (FDA approved for NASAL specimens in patients 70 years of age and older), is one component of a comprehensive surveillance program. It is not intended to diagnose infection nor to guide or monitor treatment. Performed at [facility], [place] [address], [place], [place] [address]       Radiology Studies: No results found.  Scheduled Meds:  sodium chloride   Intravenous Once   sodium chloride   Intravenous Once   allopurinol  300 mg Oral Daily   vitamin C  1,000 mg Oral Daily   aspirin EC  81 mg Oral Daily   atorvastatin  10 mg Oral Daily   clopidogrel  75 mg Oral Daily   docusate sodium  100 mg Oral BID   insulin aspart  0-15 Units Subcutaneous TID WC   insulin glargine-yfgn  10 Units Subcutaneous Daily   metoprolol succinate  50 mg Oral Daily   nutrition supplement (JUVEN)  1 packet Oral BID BM   pantoprazole  40 mg Oral BID   polyethylene glycol  17 g Oral BID   zinc sulfate  220 mg Oral Daily   Continuous Infusions:  sodium chloride     sodium chloride     magnesium sulfate bolus IVPB       LOS: 9 days   Time spent: 27 minutes  Darliss Cheney, MD [facility]  10/24/2021, 12:38 PM  Please page via [facility] and do not message via secure chat for urgent patient care matters. Secure chat can be used for non urgent patient care  matters.  How to contact the [facility] Attending or Consulting provider [place] or covering provider during after hours [place], for this patient?  Check the care team in [facility] and look for a) attending/consulting [facility] provider listed and b) the [facility] team listed. Page or secure chat [phone]. Log into [email or web address] and use [place]'s universal password to access. If you do not have the password, please contact the hospital operator. Locate the [facility] provider you are looking for under [facility] and page to a number that you can be directly reached. If you still have difficulty reaching the provider, please page the [facility] (Director on Call) for the Hospitalists listed on amion for assistance.

## 2021-10-24 NOTE — Plan of Care (Signed)
°  Problem: Clinical Measurements: Goal: Respiratory complications will improve 10/24/2021 2327 by Lydia Guiles, RN Outcome: Progressing 10/24/2021 2326 by Lydia Guiles, RN Outcome: Progressing   Problem: Nutrition: Goal: Adequate nutrition will be maintained 10/24/2021 2327 by Lydia Guiles, RN Outcome: Progressing 10/24/2021 2326 by Lydia Guiles, RN Outcome: Progressing   Problem: Coping: Goal: Level of anxiety will decrease 10/24/2021 2327 by Lydia Guiles, RN Outcome: Progressing 10/24/2021 2326 by Lydia Guiles, RN Outcome: Progressing   Problem: Elimination: Goal: Will not experience complications related to bowel motility 10/24/2021 2327 by Lydia Guiles, RN Outcome: Progressing 10/24/2021 2326 by Lydia Guiles, RN Outcome: Progressing   Problem: Elimination: Goal: Will not experience complications related to urinary retention 10/24/2021 2327 by Lydia Guiles, RN Outcome: Progressing 10/24/2021 2326 by Lydia Guiles, RN Outcome: Progressing   Problem: Pain Managment: Goal: General experience of comfort will improve 10/24/2021 2327 by Lydia Guiles, RN Outcome: Progressing 10/24/2021 2326 by Lydia Guiles, RN Outcome: Progressing   Problem: Safety: Goal: Ability to remain free from injury will improve 10/24/2021 2327 by Lydia Guiles, RN Outcome: Progressing 10/24/2021 2326 by Lydia Guiles, RN Outcome: Progressing

## 2021-10-24 NOTE — Progress Notes (Addendum)
Physical Therapy Treatment Patient Details Name: [name] [name] MRN: [id number] DOB: 02-Apr-1956 Today's Date: 10/24/2021   History of Present Illness Pt is a 66 y.o. male admitted 10/14/21 with LLE pain; pt reports he turned his L ankle when getting into car 08/2021 and has not been able to bear weight since then. Workup for chronic venous insufficiency and L ankle abscess with osteomyelitis. S/p EGD and colonoscopy on 2/2. 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 PMH includes morbid obesity, CHF, HTN, CVA.    PT Comments    Pt received in supine, agreeable to therapy session, with good participation in bed mobility and seated balance tasks/exercises at EOB. Pt limited due to posterior lean and resultant sliding unsafely toward edge of bed, so did x2 return to supine then sat back up, but unable to progress standing or seated scooting transfers due to poor seated balance. Emphasis on seated midline posture, safe BLE positioning to reduce risk of contracture, importance of quad sets to maintain BLE straight, frequent repositioning for skin integrity, and safety with bed mobility. Pt continues to benefit from PT services to progress toward functional mobility goals. Pt with good tolerance for functional mobility tasks and reports minimal pain/fatigue during session, continue to recommend AIR.  Recommendations for follow up therapy are one component of a multi-disciplinary discharge planning process, led by the attending physician.  Recommendations may be updated based on patient status, additional functional criteria and insurance authorization.  Follow Up Recommendations  Acute inpatient rehab (3hours/day)     Assistance Recommended at Discharge Frequent or constant Supervision/Assistance  Patient can return home with the following Two people to help with bathing/dressing/bathroom;Two people to help with walking and/or transfers;Help with stairs or ramp for entrance   Equipment Recommendations  Other (comment) (bari  equipment once in rehab)    Recommendations for Other Services       Precautions / Restrictions Precautions Precautions: Fall;Other (comment) Precaution Comments: new L BKA, pressure sores on L/R bottom and skin breakdown upper back from frequent itching Restrictions Weight Bearing Restrictions: Yes LLE Weight Bearing: Non weight bearing     Mobility  Bed Mobility Overal bed mobility: Needs Assistance Bed Mobility: Rolling, Sidelying to Sit, Sit to Supine Rolling: Mod assist Sidelying to sit: Mod assist, +2 for physical assistance Supine to sit: Mod assist, +2 for physical assistance, +2 for safety/equipment Sit to supine: Mod assist, +2 for physical assistance, +2 for safety/equipment   General bed mobility comments: cues for self-assist and to pull on bed rail rather than on therapist arm, pt needing heavy modA for trunk rise but able to assist with bed rail; x2 trials due to posterior lean while seated and sliding off bed needed return to supine to readjust    Transfers   General transfer comment: deferred due to fatigue/pt frequently sliding off bed, difficulty sitting statically EOB and frequent posterior lean/LOB        Balance Overall balance assessment: Needs assistance Sitting-balance support: Feet supported, Bilateral upper extremity supported Sitting balance-Leahy Scale: Poor Sitting balance - Comments: requires bedrail support and +2, multiple mild posterior LOB needing minA to recover, max cues for seated posture as he tends to lean back causing hips to slide forward Postural control: Posterior lean     Standing balance comment: pt unable to stand       Cognition Arousal/Alertness: Awake/alert Behavior During Therapy: WFL for tasks assessed/performed, Flat affect Overall Cognitive Status: Within Functional Limits for tasks assessed  Exercises Amputee Exercises Quad Sets: Left, AAROM, 5 reps, Supine Hip ABduction/ADduction: AROM, AAROM, Left,  10 reps, Supine Hip Flexion/Marching: AAROM, 5 reps, Supine Knee Flexion: AAROM, Left, 5 reps, Supine Straight Leg Raises: AAROM, Left, 5 reps, Supine Other Exercises Other Exercises: seated EOB lateral leans with elbow taps x10 reps ea (5+5 reps)    General Comments General comments (skin integrity, edema, etc.): VSS on RA, 2L [place] not dyspneic, 1/4 at most DOE.      Pertinent Vitals/Pain Pain Assessment Pain Assessment: Faces Faces Pain Scale: Hurts little more Pain Location: LLE to try on the limb protector Pain Descriptors / Indicators: Guarding, Grimacing Pain Intervention(s): Limited activity within patient's tolerance, Monitored during session, Premedicated before session, Repositioned     PT Goals (current goals can now be found in the care plan section) Acute Rehab PT Goals Patient Stated Goal: less pain, be able to stand PT Goal Formulation: With patient Time For Goal Achievement: 10/30/21 Progress towards PT goals: Progressing toward goals    Frequency    Min 4X/week      PT Plan Current plan remains appropriate    Co-evaluation PT/OT/SLP Co-Evaluation/Treatment: Yes Reason for Co-Treatment: Complexity of the patient's impairments (multi-system involvement);For patient/therapist safety;To address functional/ADL transfers PT goals addressed during session: Mobility/safety with mobility;Balance;Strengthening/ROM OT goals addressed during session: ADL's and self-care;Strengthening/ROM      AM-PAC PT "6 Clicks" Mobility   Outcome Measure  Help needed turning from your back to your side while in a flat bed without using bedrails?: A Lot Help needed moving from lying on your back to sitting on the side of a flat bed without using bedrails?: A Lot Help needed moving to and from a bed to a chair (including a wheelchair)?: Total Help needed standing up from a chair using your arms (e.g., wheelchair or bedside chair)?: Total Help needed to walk in hospital room?:  Total Help needed climbing 3-5 steps with a railing? : Total 6 Click Score: 8    End of Session Equipment Utilized During Treatment: Oxygen Activity Tolerance: Patient tolerated treatment well Patient left: in bed;with call bell/phone within reach;Other (comment);with family/visitor present (4 rails up for safety due to air bed; alarm won't set on air bed, may need weight re-enter on bed? sister Lattie Haw present initially, returning at end of session) Nurse Communication: Mobility status;Need for lift equipment;Other (comment) (pt will need lift for OOB; pt itching back frequently may need meds for itching) PT Visit Diagnosis: Pain;Other abnormalities of gait and mobility (R26.89);Muscle weakness (generalized) (M62.81);Difficulty in walking, not elsewhere classified (R26.2) Pain - Right/Left: Left Pain - part of body: Leg     Time: 0034-9179 PT Time Calculation (min) (ACUTE ONLY): 31 min  Charges:  $Therapeutic Exercise: 8-22 mins                     Cheryl Chay P., PTA Acute Rehabilitation Services Pager: [phone] Office: [place] 10/24/2021, 4:34 PM

## 2021-10-24 NOTE — Progress Notes (Signed)
Occupational Therapy Treatment Patient Details Name: [name] [name] MRN: [id number] DOB: 04-15-1956 Today's Date: 10/24/2021   History of present illness Pt is a 66 y.o. male admitted 10/14/21 with LLE pain; pt reports he turned his L ankle when getting into car 08/2021 and has not been able to bear weight since then. Workup for chronic venous insufficiency and L ankle abscess with osteomyelitis. S/p EGD and colonoscopy on 2/2. 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 PMH includes morbid obesity, CHF, HTN, CVA.   OT comments  Pt seen today to work on transfers and ADLs. Pt mod A +2 for bed mobility, min -max A for ADLs, transfers deferred due to decreased sitting balance. Pt presenting with difficulty sitting upright EOB, benefits from use of bedrail and increased cuing/physical assistance to scoot hips backward. Pt able to complete seated ADLs sitting EOB for ~10 mins and educated pt on UE theraband exercises to complete while in bed. Pt verbalized/demo'd understanding. Pt presenting with impairments listed below, however remains motivated to progress with therapy. Will continue to follow acutely. Recommend AIR/CIR at d/c.   Recommendations for follow up therapy are one component of a multi-disciplinary discharge planning process, led by the attending physician.  Recommendations may be updated based on patient status, additional functional criteria and insurance authorization.    Follow Up Recommendations  Acute inpatient rehab (3hours/day)    Assistance Recommended at Discharge Frequent or constant Supervision/Assistance  Patient can return home with the following  A lot of help with bathing/dressing/bathroom;Two people to help with walking and/or transfers;Assistance with cooking/housework;Help with stairs or ramp for entrance;Assist for transportation   Equipment Recommendations  Wheelchair (measurements OT);Wheelchair cushion (measurements OT)    Recommendations for Other Services Rehab consult    Precautions /  Restrictions Precautions Precautions: Fall;Other (comment) Precaution Comments: new L BKA, pressure sores on L/R bottom and skin breakdown upper back from frequent itching Restrictions Weight Bearing Restrictions: Yes LLE Weight Bearing: Non weight bearing       Mobility Bed Mobility Overal bed mobility: Needs Assistance Bed Mobility: Supine to Sit, Sit to Supine     Supine to sit: Mod assist, +2 for physical assistance, +2 for safety/equipment Sit to supine: Mod assist, +2 for physical assistance, +2 for safety/equipment        Transfers                   General transfer comment: deferred due to fatigue/pt frequently sliding off bed, difficulty sitting statically EOB     Balance Overall balance assessment: Needs assistance Sitting-balance support: Feet supported, Bilateral upper extremity supported Sitting balance-Leahy Scale: Poor Sitting balance - Comments: requires bedrail support Postural control: Posterior lean                                 ADL either performed or assessed with clinical judgement   ADL   Eating/Feeding: Set up;Sitting   Grooming: Set up;Sitting           Upper Body Dressing : Minimal assistance;Sitting Upper Body Dressing Details (indicate cue type and reason): able to doff/don gown Lower Body Dressing: Total assistance;Bed level       Toileting- Clothing Manipulation and Hygiene: Total assistance;+2 for physical assistance Toileting - Clothing Manipulation Details (indicate cue type and reason): max A to place scrotal sling     Functional mobility during ADLs: Moderate assistance;Maximal assistance;+2 for physical assistance      Extremity/Trunk Assessment Upper  Extremity Assessment Upper Extremity Assessment: RUE deficits/detail RUE Deficits / Details: hx of CVA and residual weakness, unable to lift UE beyond 90*, PROM WFL RUE Sensation: decreased light touch RUE Coordination: decreased fine  motor;decreased gross motor   Lower Extremity Assessment Lower Extremity Assessment: Overall WFL for tasks assessed        Vision   Vision Assessment?: No apparent visual deficits   Perception Perception Perception: Not tested   Praxis Praxis Praxis: Not tested    Cognition Arousal/Alertness: Awake/alert Behavior During Therapy: WFL for tasks assessed/performed, Flat affect Overall Cognitive Status: Within Functional Limits for tasks assessed                                          Exercises Exercises: General Upper Extremity General Exercises - Upper Extremity Shoulder Flexion: AROM, 5 reps, Both, Seated    Shoulder Instructions       General Comments VSS on RA, 2L [place] not dyspneic, 1/4 at most DOE.    Pertinent Vitals/ Pain       Pain Assessment Pain Assessment: Faces Pain Score: 5  Faces Pain Scale: Hurts little more Pain Location: LLE to try on the limb protector Pain Descriptors / Indicators: Guarding Pain Intervention(s): Limited activity within patient's tolerance, Monitored during session, Repositioned  Home Living                                          Prior Functioning/Environment              Frequency  Min 2X/week        Progress Toward Goals  OT Goals(current goals can now be found in the care plan section)  Progress towards OT goals: Progressing toward goals  Acute Rehab OT Goals Patient Stated Goal: none stated OT Goal Formulation: With patient Time For Goal Achievement: 11/05/21 Potential to Achieve Goals: Good ADL Goals Pt Will Perform Lower Body Bathing: with mod assist;sitting/lateral leans Pt Will Transfer to Toilet: with mod assist;with +2 assist;squat pivot transfer;stand pivot transfer;bedside commode Pt/caregiver will Perform Home Exercise Program: Increased strength;Both right and left upper extremity;With theraband;Independently;With written HEP provided Additional ADL Goal #1: Pt  to demo bed mobility at Supervision level in prep for ADL transfers  Plan Discharge plan remains appropriate;Frequency remains appropriate    Co-evaluation    PT/OT/SLP Co-Evaluation/Treatment: Yes Reason for Co-Treatment: Complexity of the patient's impairments (multi-system involvement);For patient/therapist safety;To address functional/ADL transfers PT goals addressed during session: Mobility/safety with mobility;Balance;Strengthening/ROM OT goals addressed during session: ADL's and self-care;Strengthening/ROM      AM-PAC OT "6 Clicks" Daily Activity     Outcome Measure   Help from another person eating meals?: A Little Help from another person taking care of personal grooming?: A Little Help from another person toileting, which includes using toliet, bedpan, or urinal?: Total Help from another person bathing (including washing, rinsing, drying)?: A Lot Help from another person to put on and taking off regular upper body clothing?: A Lot Help from another person to put on and taking off regular lower body clothing?: Total 6 Click Score: 12    End of Session    OT Visit Diagnosis: Unsteadiness on feet (R26.81);Muscle weakness (generalized) (M62.81)   Activity Tolerance Patient tolerated treatment well   Patient Left in bed;with call  bell/phone within reach;with family/visitor present   Nurse Communication Mobility status        Time: 4730-8569 OT Time Calculation (min): 31 min  Charges: OT General Charges $OT Visit: 1 Visit OT Treatments $Therapeutic Activity: 8-22 mins  Lynnda Child, OTD, OTR/L Acute Rehab [phone] - [phone]   Kaylyn Lim 10/24/2021, 4:39 PM

## 2021-10-24 NOTE — Progress Notes (Signed)
Patient ID: [name] [name], male   DOB: [birth year], 66 y.o.   MRN: [id number] Patient is a 66 year old gentleman who is postoperative day 3 left transtibial amputation.  Patient had significant defects in the soft tissue envelope secondary to his massive venous insufficiency.  There is now 100 cc in the wound VAC canister there is still a good suction fit.  Anticipate patient will need discharge to inpatient rehab or skilled nursing.

## 2021-10-25 ENCOUNTER — Inpatient Hospital Stay (HOSPITAL_COMMUNITY)
Admission: RE | Admit: 2021-10-25 | Discharge: 2021-11-17 | DRG: 559 | Disposition: A | Discharge status: Skilled Nursing Facility | Payer: Medicare Other | Source: Intra-hospital | Attending: Physical Medicine and Rehabilitation | Admitting: Physical Medicine and Rehabilitation

## 2021-10-25 ENCOUNTER — Other Ambulatory Visit: Payer: Self-pay

## 2021-10-25 ENCOUNTER — Encounter (HOSPITAL_COMMUNITY): Payer: Self-pay | Admitting: Physical Medicine & Rehabilitation

## 2021-10-25 DIAGNOSIS — Z4781 Encounter for orthopedic aftercare following surgical amputation: Principal | ICD-10-CM

## 2021-10-25 DIAGNOSIS — Z8616 Personal history of COVID-19: Secondary | ICD-10-CM | POA: Diagnosis not present

## 2021-10-25 DIAGNOSIS — E1165 Type 2 diabetes mellitus with hyperglycemia: Secondary | ICD-10-CM | POA: Diagnosis present

## 2021-10-25 DIAGNOSIS — M7989 Other specified soft tissue disorders: Secondary | ICD-10-CM | POA: Diagnosis present

## 2021-10-25 DIAGNOSIS — L97819 Non-pressure chronic ulcer of other part of right lower leg with unspecified severity: Secondary | ICD-10-CM | POA: Diagnosis present

## 2021-10-25 DIAGNOSIS — G546 Phantom limb syndrome with pain: Secondary | ICD-10-CM | POA: Diagnosis present

## 2021-10-25 DIAGNOSIS — J99 Respiratory disorders in diseases classified elsewhere: Secondary | ICD-10-CM | POA: Diagnosis present

## 2021-10-25 DIAGNOSIS — G4733 Obstructive sleep apnea (adult) (pediatric): Secondary | ICD-10-CM | POA: Diagnosis present

## 2021-10-25 DIAGNOSIS — Z87442 Personal history of urinary calculi: Secondary | ICD-10-CM

## 2021-10-25 DIAGNOSIS — I83018 Varicose veins of right lower extremity with ulcer other part of lower leg: Secondary | ICD-10-CM | POA: Diagnosis present

## 2021-10-25 DIAGNOSIS — K254 Chronic or unspecified gastric ulcer with hemorrhage: Secondary | ICD-10-CM | POA: Diagnosis present

## 2021-10-25 DIAGNOSIS — M6281 Muscle weakness (generalized): Secondary | ICD-10-CM | POA: Diagnosis present

## 2021-10-25 DIAGNOSIS — L89156 Pressure-induced deep tissue damage of sacral region: Secondary | ICD-10-CM | POA: Diagnosis present

## 2021-10-25 DIAGNOSIS — I5033 Acute on chronic diastolic (congestive) heart failure: Secondary | ICD-10-CM | POA: Diagnosis present

## 2021-10-25 DIAGNOSIS — E785 Hyperlipidemia, unspecified: Secondary | ICD-10-CM | POA: Diagnosis present

## 2021-10-25 DIAGNOSIS — Z741 Need for assistance with personal care: Secondary | ICD-10-CM | POA: Diagnosis present

## 2021-10-25 DIAGNOSIS — Z7902 Long term (current) use of antithrombotics/antiplatelets: Secondary | ICD-10-CM

## 2021-10-25 DIAGNOSIS — E662 Morbid (severe) obesity with alveolar hypoventilation: Secondary | ICD-10-CM | POA: Diagnosis present

## 2021-10-25 DIAGNOSIS — D849 Immunodeficiency, unspecified: Secondary | ICD-10-CM | POA: Diagnosis present

## 2021-10-25 DIAGNOSIS — I6930 Unspecified sequelae of cerebral infarction: Secondary | ICD-10-CM | POA: Diagnosis not present

## 2021-10-25 DIAGNOSIS — M109 Gout, unspecified: Secondary | ICD-10-CM | POA: Diagnosis present

## 2021-10-25 DIAGNOSIS — L89322 Pressure ulcer of left buttock, stage 2: Secondary | ICD-10-CM | POA: Diagnosis present

## 2021-10-25 DIAGNOSIS — Z7401 Bed confinement status: Secondary | ICD-10-CM | POA: Diagnosis not present

## 2021-10-25 DIAGNOSIS — L89312 Pressure ulcer of right buttock, stage 2: Secondary | ICD-10-CM | POA: Diagnosis present

## 2021-10-25 DIAGNOSIS — I83893 Varicose veins of bilateral lower extremities with other complications: Secondary | ICD-10-CM | POA: Diagnosis present

## 2021-10-25 DIAGNOSIS — I878 Other specified disorders of veins: Secondary | ICD-10-CM | POA: Diagnosis present

## 2021-10-25 DIAGNOSIS — D500 Iron deficiency anemia secondary to blood loss (chronic): Secondary | ICD-10-CM | POA: Diagnosis present

## 2021-10-25 DIAGNOSIS — Z823 Family history of stroke: Secondary | ICD-10-CM

## 2021-10-25 DIAGNOSIS — Z89512 Acquired absence of left leg below knee: Secondary | ICD-10-CM

## 2021-10-25 DIAGNOSIS — N179 Acute kidney failure, unspecified: Secondary | ICD-10-CM | POA: Diagnosis present

## 2021-10-25 DIAGNOSIS — G0491 Myelitis, unspecified: Secondary | ICD-10-CM | POA: Diagnosis present

## 2021-10-25 DIAGNOSIS — R2231 Localized swelling, mass and lump, right upper limb: Secondary | ICD-10-CM | POA: Diagnosis present

## 2021-10-25 DIAGNOSIS — K635 Polyp of colon: Secondary | ICD-10-CM | POA: Diagnosis present

## 2021-10-25 DIAGNOSIS — N5089 Other specified disorders of the male genital organs: Secondary | ICD-10-CM | POA: Diagnosis present

## 2021-10-25 DIAGNOSIS — R531 Weakness: Secondary | ICD-10-CM | POA: Diagnosis not present

## 2021-10-25 DIAGNOSIS — I110 Hypertensive heart disease with heart failure: Secondary | ICD-10-CM | POA: Diagnosis present

## 2021-10-25 DIAGNOSIS — L97919 Non-pressure chronic ulcer of unspecified part of right lower leg with unspecified severity: Secondary | ICD-10-CM | POA: Diagnosis present

## 2021-10-25 DIAGNOSIS — Z6841 Body Mass Index (BMI) 40.0 and over, adult: Secondary | ICD-10-CM | POA: Diagnosis not present

## 2021-10-25 DIAGNOSIS — Z8249 Family history of ischemic heart disease and other diseases of the circulatory system: Secondary | ICD-10-CM

## 2021-10-25 DIAGNOSIS — Z993 Dependence on wheelchair: Secondary | ICD-10-CM

## 2021-10-25 DIAGNOSIS — D62 Acute posthemorrhagic anemia: Secondary | ICD-10-CM | POA: Diagnosis present

## 2021-10-25 DIAGNOSIS — K648 Other hemorrhoids: Secondary | ICD-10-CM | POA: Diagnosis present

## 2021-10-25 DIAGNOSIS — I69351 Hemiplegia and hemiparesis following cerebral infarction affecting right dominant side: Secondary | ICD-10-CM

## 2021-10-25 DIAGNOSIS — N492 Inflammatory disorders of scrotum: Secondary | ICD-10-CM | POA: Diagnosis present

## 2021-10-25 DIAGNOSIS — E1169 Type 2 diabetes mellitus with other specified complication: Secondary | ICD-10-CM | POA: Diagnosis present

## 2021-10-25 DIAGNOSIS — I739 Peripheral vascular disease, unspecified: Secondary | ICD-10-CM | POA: Diagnosis not present

## 2021-10-25 DIAGNOSIS — S88112D Complete traumatic amputation at level between knee and ankle, left lower leg, subsequent encounter: Secondary | ICD-10-CM | POA: Diagnosis not present

## 2021-10-25 DIAGNOSIS — R41841 Cognitive communication deficit: Secondary | ICD-10-CM | POA: Diagnosis present

## 2021-10-25 DIAGNOSIS — Z7982 Long term (current) use of aspirin: Secondary | ICD-10-CM

## 2021-10-25 DIAGNOSIS — I69331 Monoplegia of upper limb following cerebral infarction affecting right dominant side: Secondary | ICD-10-CM | POA: Diagnosis not present

## 2021-10-25 DIAGNOSIS — I10 Essential (primary) hypertension: Secondary | ICD-10-CM | POA: Diagnosis not present

## 2021-10-25 DIAGNOSIS — E1140 Type 2 diabetes mellitus with diabetic neuropathy, unspecified: Secondary | ICD-10-CM | POA: Diagnosis present

## 2021-10-25 DIAGNOSIS — M009 Pyogenic arthritis, unspecified: Secondary | ICD-10-CM | POA: Diagnosis present

## 2021-10-25 DIAGNOSIS — I872 Venous insufficiency (chronic) (peripheral): Secondary | ICD-10-CM | POA: Diagnosis present

## 2021-10-25 DIAGNOSIS — Z79899 Other long term (current) drug therapy: Secondary | ICD-10-CM

## 2021-10-25 DIAGNOSIS — I890 Lymphedema, not elsewhere classified: Secondary | ICD-10-CM | POA: Diagnosis present

## 2021-10-25 DIAGNOSIS — L309 Dermatitis, unspecified: Secondary | ICD-10-CM | POA: Diagnosis present

## 2021-10-25 DIAGNOSIS — Z833 Family history of diabetes mellitus: Secondary | ICD-10-CM

## 2021-10-25 DIAGNOSIS — L98411 Non-pressure chronic ulcer of buttock limited to breakdown of skin: Secondary | ICD-10-CM | POA: Diagnosis present

## 2021-10-25 DIAGNOSIS — E669 Obesity, unspecified: Secondary | ICD-10-CM | POA: Diagnosis not present

## 2021-10-25 DIAGNOSIS — Z800 Family history of malignant neoplasm of digestive organs: Secondary | ICD-10-CM

## 2021-10-25 LAB — BASIC METABOLIC PANEL
Anion gap: 8 (ref 5–15)
BUN: 39 mg/dL — ABNORMAL HIGH (ref 8–23)
CO2: 24 mmol/L (ref 22–32)
Calcium: 7.7 mg/dL — ABNORMAL LOW (ref 8.9–10.3)
Chloride: 105 mmol/L (ref 98–111)
Creatinine, Ser: 2.08 mg/dL — ABNORMAL HIGH (ref 0.61–1.24)
GFR, Estimated: 34 mL/min — ABNORMAL LOW (ref >60)
Glucose, Bld: 142 mg/dL — ABNORMAL HIGH (ref 70–99)
Potassium: 4.6 mmol/L (ref 3.5–5.1)
Sodium: 137 mmol/L (ref 135–145)

## 2021-10-25 LAB — CBC WITH DIFFERENTIAL/PLATELET
Abs Immature Granulocytes: 0.15 10*3/uL — ABNORMAL HIGH (ref 0.00–0.07)
Basophils Absolute: 0.1 10*3/uL (ref 0.0–0.1)
Basophils Relative: 1 %
Eosinophils Absolute: 0.5 10*3/uL (ref 0.0–0.5)
Eosinophils Relative: 6 %
HCT: 25.1 % — ABNORMAL LOW (ref 39.0–52.0)
Hemoglobin: 7.6 g/dL — ABNORMAL LOW (ref 13.0–17.0)
Immature Granulocytes: 2 %
Lymphocytes Relative: 9 %
Lymphs Abs: 0.8 10*3/uL (ref 0.7–4.0)
MCH: 24.4 pg — ABNORMAL LOW (ref 26.0–34.0)
MCHC: 30.3 g/dL (ref 30.0–36.0)
MCV: 80.4 fL (ref 80.0–100.0)
Monocytes Absolute: 0.8 10*3/uL (ref 0.1–1.0)
Monocytes Relative: 9 %
Neutro Abs: 6.3 10*3/uL (ref 1.7–7.7)
Neutrophils Relative %: 73 %
Platelets: 229 10*3/uL (ref 150–400)
RBC: 3.12 MIL/uL — ABNORMAL LOW (ref 4.22–5.81)
RDW: 20.1 % — ABNORMAL HIGH (ref 11.5–15.5)
WBC: 8.6 10*3/uL (ref 4.0–10.5)
nRBC: 0 % (ref 0.0–0.2)

## 2021-10-25 LAB — COMPREHENSIVE METABOLIC PANEL
ALT: 27 U/L (ref 0–44)
AST: 22 U/L (ref 15–41)
Albumin: 1.8 g/dL — ABNORMAL LOW (ref 3.5–5.0)
Alkaline Phosphatase: 51 U/L (ref 38–126)
Anion gap: 8 (ref 5–15)
BUN: 37 mg/dL — ABNORMAL HIGH (ref 8–23)
CO2: 23 mmol/L (ref 22–32)
Calcium: 7.8 mg/dL — ABNORMAL LOW (ref 8.9–10.3)
Chloride: 107 mmol/L (ref 98–111)
Creatinine, Ser: 2.06 mg/dL — ABNORMAL HIGH (ref 0.61–1.24)
GFR, Estimated: 35 mL/min — ABNORMAL LOW (ref >60)
Glucose, Bld: 141 mg/dL — ABNORMAL HIGH (ref 70–99)
Potassium: 4.8 mmol/L (ref 3.5–5.1)
Sodium: 138 mmol/L (ref 135–145)
Total Bilirubin: 0.3 mg/dL (ref 0.3–1.2)
Total Protein: 5.6 g/dL — ABNORMAL LOW (ref 6.5–8.1)

## 2021-10-25 LAB — GLUCOSE, CAPILLARY
Glucose-Capillary: 145 mg/dL — ABNORMAL HIGH (ref 70–99)
Glucose-Capillary: 145 mg/dL — ABNORMAL HIGH (ref 70–99)
Glucose-Capillary: 134 mg/dL — ABNORMAL HIGH (ref 70–99)
Glucose-Capillary: 159 mg/dL — ABNORMAL HIGH (ref 70–99)

## 2021-10-25 LAB — SURGICAL PATHOLOGY

## 2021-10-25 MED ORDER — DOCUSATE SODIUM 100 MG PO CAPS
100.0000 mg | ORAL_CAPSULE | Freq: Two times a day (BID) | ORAL | Status: DC
  Administered 2021-10-31 – 2021-11-17 (×25): 100 mg via ORAL
  Filled 2021-10-25 (×41): qty 1

## 2021-10-25 MED ORDER — POLYETHYLENE GLYCOL 3350 17 G PO PACK
17.0000 g | PACK | Freq: Two times a day (BID) | ORAL | Status: DC
  Administered 2021-10-30 – 2021-11-16 (×5): 17 g via ORAL
  Filled 2021-10-25 (×38): qty 1

## 2021-10-25 MED ORDER — POLYETHYLENE GLYCOL 3350 17 G PO PACK
17.0000 g | PACK | Freq: Every day | ORAL | Status: DC | PRN
  Administered 2021-10-29: 17 g via ORAL
  Filled 2021-10-25: qty 1

## 2021-10-25 MED ORDER — PANTOPRAZOLE SODIUM 40 MG PO TBEC
40.0000 mg | DELAYED_RELEASE_TABLET | Freq: Two times a day (BID) | ORAL | Status: DC
  Administered 2021-10-25 – 2021-11-17 (×46): 40 mg via ORAL
  Filled 2021-10-25 (×46): qty 1

## 2021-10-25 MED ORDER — METOPROLOL SUCCINATE ER 25 MG PO TB24: 25.0000 mg | ORAL_TABLET | Freq: Every day | ORAL | 0 refills | Status: AC

## 2021-10-25 MED ORDER — METOPROLOL SUCCINATE ER 25 MG PO TB24
25.0000 mg | ORAL_TABLET | Freq: Every day | ORAL | Status: DC
  Administered 2021-10-26 – 2021-11-17 (×23): 25 mg via ORAL
  Filled 2021-10-25 (×24): qty 1

## 2021-10-25 MED ORDER — ONDANSETRON HCL 4 MG PO TABS: 4.0000 mg | ORAL_TABLET | Freq: Four times a day (QID) | ORAL | Status: DC | PRN

## 2021-10-25 MED ORDER — ZINC SULFATE 220 (50 ZN) MG PO CAPS
220.0000 mg | ORAL_CAPSULE | Freq: Every day | ORAL | Status: AC
  Administered 2021-10-26 – 2021-11-04 (×10): 220 mg via ORAL
  Filled 2021-10-25 (×10): qty 1

## 2021-10-25 MED ORDER — ASPIRIN EC 81 MG PO TBEC
81.0000 mg | DELAYED_RELEASE_TABLET | Freq: Every day | ORAL | Status: DC
  Administered 2021-10-26 – 2021-11-17 (×23): 81 mg via ORAL
  Filled 2021-10-25 (×23): qty 1

## 2021-10-25 MED ORDER — JUVEN PO PACK
1.0000 | PACK | Freq: Two times a day (BID) | ORAL | Status: DC
  Administered 2021-10-25 – 2021-11-16 (×33): 1 via ORAL
  Filled 2021-10-25 (×25): qty 1

## 2021-10-25 MED ORDER — ATORVASTATIN CALCIUM 10 MG PO TABS
10.0000 mg | ORAL_TABLET | Freq: Every day | ORAL | Status: DC
Start: 2021-10-26 — End: 2021-11-17
  Administered 2021-10-26 – 2021-11-17 (×23): 10 mg via ORAL
  Filled 2021-10-25 (×23): qty 1

## 2021-10-25 MED ORDER — ALLOPURINOL 300 MG PO TABS
300.0000 mg | ORAL_TABLET | Freq: Every day | ORAL | Status: DC
  Administered 2021-10-26 – 2021-11-17 (×23): 300 mg via ORAL
  Filled 2021-10-25 (×23): qty 1

## 2021-10-25 MED ORDER — PANTOPRAZOLE SODIUM 40 MG PO TBEC: 40.0000 mg | DELAYED_RELEASE_TABLET | Freq: Two times a day (BID) | ORAL | 0 refills | Status: DC

## 2021-10-25 MED ORDER — ASCORBIC ACID 500 MG PO TABS
1000.0000 mg | ORAL_TABLET | Freq: Every day | ORAL | Status: DC
  Administered 2021-10-26 – 2021-11-17 (×23): 1000 mg via ORAL
  Filled 2021-10-25 (×23): qty 2

## 2021-10-25 MED ORDER — ACETAMINOPHEN 325 MG PO TABS
325.0000 mg | ORAL_TABLET | Freq: Four times a day (QID) | ORAL | Status: DC | PRN
  Administered 2021-11-01 – 2021-11-08 (×2): 650 mg via ORAL
  Administered 2021-11-10: 325 mg via ORAL
  Administered 2021-11-11 – 2021-11-15 (×5): 650 mg via ORAL
  Filled 2021-10-25 (×7): qty 2

## 2021-10-25 MED ORDER — INSULIN ASPART 100 UNIT/ML IJ SOLN
0.0000 [IU] | Freq: Three times a day (TID) | INTRAMUSCULAR | Status: DC
  Administered 2021-10-25: 2 [IU] via SUBCUTANEOUS
  Administered 2021-10-26: 3 [IU] via SUBCUTANEOUS
  Administered 2021-10-26 – 2021-10-28 (×7): 2 [IU] via SUBCUTANEOUS
  Administered 2021-10-29: 3 [IU] via SUBCUTANEOUS
  Administered 2021-10-29 – 2021-11-05 (×14): 2 [IU] via SUBCUTANEOUS
  Administered 2021-11-05: 3 [IU] via SUBCUTANEOUS
  Administered 2021-11-05 – 2021-11-06 (×2): 2 [IU] via SUBCUTANEOUS
  Administered 2021-11-06: 3 [IU] via SUBCUTANEOUS
  Administered 2021-11-07 – 2021-11-17 (×24): 2 [IU] via SUBCUTANEOUS

## 2021-10-25 MED ORDER — CLOPIDOGREL BISULFATE 75 MG PO TABS
75.0000 mg | ORAL_TABLET | Freq: Every day | ORAL | Status: DC
  Administered 2021-10-26 – 2021-11-17 (×23): 75 mg via ORAL
  Filled 2021-10-25 (×23): qty 1

## 2021-10-25 MED ORDER — INSULIN GLARGINE-YFGN 100 UNIT/ML ~~LOC~~ SOLN
10.0000 [IU] | Freq: Every day | SUBCUTANEOUS | Status: DC
  Administered 2021-10-26 – 2021-11-15 (×21): 10 [IU] via SUBCUTANEOUS
  Filled 2021-10-25 (×23): qty 0.1

## 2021-10-25 MED ORDER — HYDROCODONE-ACETAMINOPHEN 5-325 MG PO TABS
1.0000 | ORAL_TABLET | ORAL | Status: DC | PRN
  Administered 2021-10-26 – 2021-10-27 (×3): 2 via ORAL
  Administered 2021-10-28 – 2021-11-09 (×11): 1 via ORAL
  Filled 2021-10-25: qty 1
  Filled 2021-10-25 (×2): qty 2
  Filled 2021-10-25 (×5): qty 1
  Filled 2021-10-25: qty 2
  Filled 2021-10-25: qty 1
  Filled 2021-10-25 (×3): qty 2
  Filled 2021-10-25: qty 1
  Filled 2021-10-25 (×2): qty 2
  Filled 2021-10-25 (×2): qty 1

## 2021-10-25 MED ORDER — ONDANSETRON HCL 4 MG/2ML IJ SOLN: 4.0000 mg | Freq: Four times a day (QID) | INTRAMUSCULAR | Status: DC | PRN

## 2021-10-25 MED ORDER — GLIPIZIDE 5 MG PO TABS: 5.0000 mg | ORAL_TABLET | Freq: Two times a day (BID) | ORAL | 11 refills | Status: DC

## 2021-10-25 MED ORDER — SODIUM CHLORIDE 0.9 % IV SOLN: INTRAVENOUS | Status: DC

## 2021-10-25 MED ORDER — ALLOPURINOL 300 MG PO TABS: 300.0000 mg | ORAL_TABLET | Freq: Every day | ORAL | Status: DC

## 2021-10-25 NOTE — Discharge Summary (Addendum)
PatientPhysician Discharge Summary  [name] [name] WUJ:[id number] DOB: 1955/11/16 DOA: 10/14/2021  PCP: Manon Hilding, MD  Admit date: 10/14/2021 Discharge date: 10/25/2021 30 Day Unplanned Readmission Risk Score    Flowsheet Row ED to Hosp-Admission (Current) from 10/14/2021 in [facility]  30 Day Unplanned Readmission Risk Score (%) 15.22 Filed at 10/25/2021 0801       This score is the patient's risk of an unplanned readmission within 30 days of being discharged (0 -100%). The score is based on dignosis, age, lab data, medications, orders, and past utilization.   Low:  0-14.9   Medium: 15-21.9   High: 22-29.9   Extreme: 30 and above          Admitted From: Home Disposition: CIR  Recommendations for Outpatient Follow-up:  Follow up with PCP in 1-2 weeks Please obtain BMP/CBC in one week Follow-up with Dr. Sharol Given but has a scheduled appointment Please follow up with your PCP on the following pending results: Unresulted Labs (From admission, onward)     Start     Ordered   Signed and Held  Comprehensive metabolic panel  Tomorrow morning,   R       Question:  Specimen collection method  Answer:  Lab=Lab collect   Signed and Held   Signed and Held  CBC WITH DIFFERENTIAL  Tomorrow morning,   R       Question:  Specimen collection method  Answer:  Lab=Lab collect   Signed and Held              Home Health: None Equipment/Devices: None  Discharge Condition: Stable CODE STATUS: Full code Diet recommendation: Cardiac  Subjective: Seen and examined.  He has no complaints.  He is agreeable to go to CIR today.  Brief/Interim Summary: [name] [name] is a 66 y.o. male with medical history significant for chronic venous stasis, lymphedema, CVA, asbestosis, hypertension, OSA not on CPAP, who presented with concerns of worsening left ankle pain.  Admitted for cellulitis. 1/28 MRI positive for osteomyelitis of the ankle joint as well as hindfoot  and septic arthritis.  Orthopedic consulted.  Started on broad-spectrum antibiotics.  Subsequently underwent transtibial amputation left leg by Dr. Sharol Given on 10/21/2021, has wound VAC.  Had leukocytosis postoperatively but that resolved and he remained afebrile so this was likely reactive/stress reaction.  He has been cleared by orthopedics for discharge.  Seen by PT OT and they recommended CIR.  He is to being discharged to CIR in stable condition today.    Upper and lower GI bleeding/gastric erosions/gastritis/ulcer at the ileocecal valve/multiple polyps in the colon: Hemoglobin has remained stable.  S/p EGD and colonoscopy 10/19/2021.  GI recommends continuing PPI 40 mg twice daily for 8 weeks. He is cleared from GI to resume aspirin and Plavix, both resumed on 10/22/2021.  He is being discharged on 7 more weeks of twice daily PPI.   History of CVA (cerebrovascular accident): continueContinue aspirin and Plavix. - cont statin   Pressure ulcer- (present on admission) Stage II bilateral buttock ulcer, present on admission. Wound care consulted. Low-air-loss mattress requested. Continue dressing changes.   Pressure Injury 10/15/21 Buttocks Right;Left Stage 2 -  Partial thickness loss of dermis presenting as a shallow open injury with a red, pink wound bed without slough. pt with multiple areas of stage ii pressure injury on both sides of the buttocks, also (Active) 10/15/21 1635 Location: Buttocks Location Orientation: Right;Left Staging: Stage 2 -  Partial  thickness loss of dermis presenting as a shallow open injury with a red, pink wound bed without slough. Wound Description (Comments): pt with multiple areas of stage ii pressure injury on both sides of the buttocks, also purple discoloration in area as well, sacral foam applied Present on Admission: Yes   AKI (acute kidney injury) ([place])- (present on admission) Baseline serum creatinine around 1.3.  On presentation serum creatinine 1.74.  Received IV  fluids.  AKI initially resolved but then creatinine is rising again postoperatively with creatinine peaked at 2.37 yesterday.  He was started on IV fluids, creatinine improved to 2.08 and he has had good diuresis as well.  This was likely secondary to low blood pressure as his blood pressure remained on the low side, his antihypertensives were adjusted as detailed below.  Although his creatinine has improved but still above his baseline so I have recommended that current IV fluids of normal saline 100 cc/h should be continued for another 24 hours at CIR, I have informed CIR as well.   Hyperkalemia: Resolved.   Hypertension: Patient's blood pressure had remained low postoperatively leading to possible hypoperfusion of kidneys resulting in AKI, due to this, hydralazine was discontinued and Toprol-XL was reduced to 50 mg and then subsequently to 25 mg p.o. daily.  I am discharging him on this regime and I am also discontinuing his olmesartan-hydrochlorothiazide combination.   Acute on chronic diastolic heart failure (HCC)/history of asbestosis-much better now.  No more wheezes, no crackles.  Not feeling short of breath.  Will assess daily for need of diuretics.  Continue Toprol-XL.   Morbid obesity ([place])- (present on admission) Body mass index is 50.22 kg/m.  Placing the patient at high risk for poor outcome. Not on cpap at home   T2DM: Hemoglobin A1c 7.4.  He was managed in the hospital with long-acting insulin 10 units and SSI.  I am discharging him on glipizide 5 mg p.o. twice daily.  Discharge plan was discussed with patient and/or family member and they verbalized understanding and agreed with it.  Discharge Diagnoses:  Principal Problem:   Osteomyelitis of ankle and foot ([place]) Active Problems:   Morbid obesity (HCC)   OSA (obstructive sleep apnea)   Essential hypertension   Chronic diastolic heart failure (HCC)   AKI (acute kidney injury) ([place])   Anemia   Pressure ulcer   History of  CVA (cerebrovascular accident)   Septic arthritis of ankle and foot region [facility])   Left leg pain    Discharge Instructions  Discharge Instructions     Negative Pressure Wound Therapy - Incisional   Complete by: As directed       Allergies as of 10/25/2021   No Known Allergies      Medication List     STOP taking these medications    diclofenac 75 MG EC tablet Commonly known as: VOLTAREN   hydrALAZINE 50 MG tablet Commonly known as: APRESOLINE   olmesartan-hydrochlorothiazide 40-25 MG tablet Commonly known as: BENICAR HCT       TAKE these medications    acetaminophen 500 MG tablet Commonly known as: TYLENOL Take 1,000 mg by mouth daily as needed for moderate pain.   allopurinol 300 MG tablet Commonly known as: ZYLOPRIM Take 300 mg by mouth daily.   aspirin EC 81 MG tablet Take 1 tablet (81 mg total) by mouth daily. What changed: Another medication with the same name was removed. Continue taking this medication, and follow the directions you see here.   atorvastatin  10 MG tablet Commonly known as: LIPITOR Take 10 mg by mouth daily.   clopidogrel 75 MG tablet Commonly known as: PLAVIX Take 75 mg by mouth daily.   furosemide 40 MG tablet Commonly known as: LASIX Take 80 mg by mouth daily as needed for fluid or edema.   glipiZIDE 5 MG tablet Commonly known as: GLUCOTROL Take 1 tablet (5 mg total) by mouth 2 (two) times daily.   metoprolol succinate 25 MG 24 hr tablet Commonly known as: TOPROL-XL Take 1 tablet (25 mg total) by mouth daily. What changed:  medication strength how much to take additional instructions   pantoprazole 40 MG tablet Commonly known as: PROTONIX Take 1 tablet (40 mg total) by mouth 2 (two) times daily.        Follow-up Information     [name], MD Follow up in 1 week(s).   Specialty: Orthopedic Surgery Contact information: [place] [address] [phone]         Manon Hilding, MD  Follow up in 1 week(s).   Specialty: Family Medicine Contact information: [place] [address] [phone]         Satira Sark, MD .   Specialty: Cardiology Contact information: [place]  [address] [phone]                No Known Allergies  Consultations: Orthopedics and GI   Procedures/Studies: DG Ankle Complete Left  Result Date: 10/14/2021 CLINICAL DATA:  Ankle pain for 1 month EXAM: LEFT ANKLE COMPLETE - 3+ VIEW COMPARISON:  None. FINDINGS: Frontal, oblique, and lateral views of the left ankle are obtained. Evaluation is limited due to patient positioning and cooperation. No acute fracture, subluxation, or dislocation. There is severe osteoarthritis of the tibiotalar joint and subtalar joints. Mild osteoarthritis of the midfoot. Prominent superior and inferior calcaneal spurs, with calcification in the plantar fascia. Diffuse soft tissue edema. IMPRESSION: 1. 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 2. Diffuse soft tissue swelling. 3. No acute fracture. Electronically Signed   By: Randa Ngo M.D.   On: 10/14/2021 21:09   US RENAL  Result Date: 10/24/2021 CLINICAL DATA:  Renal dysfunction EXAM: RENAL / URINARY TRACT ULTRASOUND COMPLETE COMPARISON:  03/31/2016 FINDINGS: Right Kidney: Renal measurements: 12.3 x 6.8 x 7 cm = volume: 303.38 mL. There is increased echogenicity. There is no hydronephrosis. There is 1.3 cm cyst in the midportion. Left Kidney: Renal measurements: 15.4 by 8.2 x 9 cm = volume: 594.7 mL. There is no hydronephrosis. There is increased cortical echogenicity. There is 1.5 cm cyst in the midportion. Bladder: Ureteral jets are observed at both ureterovesical junctions. Other: According to the note by the technologist, examination was technically difficult due to patient's body habitus. IMPRESSION: There is no hydronephrosis. Increased cortical echogenicity suggests medical renal disease. Bilateral  renal cysts. Electronically Signed   By: Elmer Picker M.D.   On: 10/24/2021 19:31   MR ANKLE LEFT WO CONTRAST  Result Date: 10/15/2021 CLINICAL DATA:  Ankle pain, neg xray Left ankle pain, also to rule out osteomyelitis in tibial region. Has extensive purulent venous stasis ulcer and some with necrotic tissue. EXAM: MRI OF THE LEFT ANKLE WITHOUT CONTRAST TECHNIQUE: Multiplanar, multisequence MR imaging of the ankle was performed. No intravenous contrast was administered. COMPARISON:  X-ray 10/14/2021 FINDINGS: Bones/Joint/Cartilage Complex tibiotalar and subtalar joint effusions. Small talonavicular joint effusion. Marked bone marrow edema with confluent low T1 signal changes  involving multiple adjacent stones at the ankle and hindfoot compatible with acute osteomyelitis. Involved bones include the lateral malleolus and distal fibular metaphysis, distal tibia including the posterior and medial malleoli, as well as extensive involvement of the talus, navicular, and anterior calcaneus. Pes planovalgus alignment. No acute fracture or dislocation is identified. Ligaments Anterior talofibular ligament appears torn. Muscles and Tendons Advanced fatty atrophy of the visualized lower leg and foot musculature. Tendinous structures are grossly intact. No tenosynovitis. Soft tissues Skin wound or ulceration at the anterior ankle with sinus tract extending to the talonavicular joint (series 12, image 23). Diffuse soft tissue swelling and subcutaneous edema. Although incompletely included within the field of view, there is marked subcutaneous edema at the dorsum of the forefoot (series 12, image 25). No organized or drainable fluid collections are evident. IMPRESSION: 1. Extensive acute osteomyelitis of the ankle and hindfoot. Septic arthritis of the tibiotalar, subtalar, and talonavicular joints. 2. Skin wound or ulceration at the anterior ankle with sinus tract extending to the talonavicular joint. 3. Diffuse soft  tissue swelling and subcutaneous edema. No organized or drainable fluid collections are evident. 4. Although incompletely included within the field of view, there is marked subcutaneous edema at the dorsum of the forefoot. Electronically Signed   By: Davina Poke D.O.   On: 10/15/2021 16:08   DG CHEST PORT 1 VIEW  Result Date: 10/20/2021 CLINICAL DATA:  Shortness of breath EXAM: PORTABLE CHEST 1 VIEW COMPARISON:  Previous studies including the examination of 10/15/2018 FINDINGS: Transverse diameter of heart is increased. Central pulmonary vessels are prominent without signs of alveolar pulmonary edema. Evaluation of lower lung fields is limited by patient's body habitus. There is increased density in the left lower lung fields with interval improvement. There is blunting of left lateral CP angle. There is no pneumothorax. IMPRESSION: Cardiomegaly. Central pulmonary vessels are prominent without signs of pulmonary edema. Increased density in the left lower lung fields may be due to pleural effusion and possibly underlying infiltrate. There is interval improvement in aeration of left lower lung fields. Electronically Signed   By: Elmer Picker M.D.   On: 10/20/2021 09:35   DG CHEST PORT 1 VIEW  Result Date: 10/15/2021 CLINICAL DATA:  Dyspnea history of asbestos exposure EXAM: PORTABLE CHEST 1 VIEW COMPARISON:  09/14/2019 FINDINGS: Cardiac shadow is enlarged but stable. Prominent mediastinal lipomatosis is again noted. The overall inspiratory effort is poor although the lungs are clear. No bony abnormality is noted. IMPRESSION: No acute abnormality noted. Electronically Signed   By: Inez Catalina M.D.   On: 10/15/2021 01:28     Discharge Exam: Vitals:   10/24/21 2109 10/25/21 0802  BP: 123/74 (!) 126/57  Pulse: 65 65  Resp: 17 18  Temp: 97.7 F (36.5 C) 98.1 F (36.7 C)  SpO2: 97% 94%   Vitals:   10/24/21 0751 10/24/21 1545 10/24/21 2109 10/25/21 0802  BP: 119/63 119/61 123/74 (!) 126/57   Pulse: 72 64 65 65  Resp: 17 16 17 18   Temp: 98.6 F (37 C) 98.6 F (37 C) 97.7 F (36.5 C) 98.1 F (36.7 C)  TempSrc: Oral Oral Oral Oral  SpO2: 96% 97% 97% 94%  Weight:      Height:        General: Pt is alert, awake, not in acute distress, morbidly obese Cardiovascular: RRR, S1/S2 +, no rubs, no gallops Respiratory: CTA bilaterally, no wheezing, no rhonchi Abdominal: Soft, NT, ND, bowel sounds + Extremities: Left BKA with wound VAC attached.  The results of significant diagnostics from this hospitalization (including imaging, microbiology, ancillary and laboratory) are listed below for reference.     Microbiology: Recent Results (from the past 240 hour(s))  Surgical pcr screen     Status: Abnormal   Collection Time: 10/18/21  7:55 PM   Specimen: Nasal Mucosa; Nasal Swab  Result Value Ref Range Status   MRSA, PCR NEGATIVE NEGATIVE Final   Staphylococcus aureus POSITIVE (A) NEGATIVE Final    Comment: (NOTE) The Xpert SA Assay (FDA approved for NASAL specimens in patients 22 years of age and older), is one component of a comprehensive surveillance program. It is not intended to diagnose infection nor to guide or monitor treatment. Performed at [facility], [place] [address], [place], [place] [address]      Labs: BNP (last 3 results) Recent Labs    10/15/21 0330 10/20/21 0104  BNP 135.7* 371.0*   Basic Metabolic Panel: Recent Labs  Lab 10/20/21 0123 10/22/21 0130 10/23/21 0051 10/24/21 1055 10/25/21 0834  NA 135 133* 132* 134* 137  K 4.5 4.6 5.4* 4.4 4.6  CL 101 98 100 101 105  CO2 25 25 21* 25 24  GLUCOSE 167* 178* 158* 162* 142*  BUN 19 26* 35* 40* 39*  CREATININE 1.12 1.55* 1.98* 2.37* 2.08*  CALCIUM 8.8* 8.2* 7.6* 8.0* 7.7*   Liver Function Tests: No results for input(s): AST, ALT, ALKPHOS, BILITOT, PROT, ALBUMIN in the last 168 hours. No results for input(s): LIPASE, AMYLASE in the last 168 hours. No results for input(s): AMMONIA in  the last 168 hours. CBC: Recent Labs  Lab 10/19/21 0251 10/20/21 0123 10/22/21 0130 10/23/21 0051 10/24/21 1055  WBC 7.5 8.8 16.8* 16.0* 10.0  NEUTROABS  --   --   --   --  7.5  HGB 9.0* 9.0* 9.0* 7.9* 7.6*  HCT 29.3* 29.5* 29.5* 25.7* 25.1*  MCV 78.8* 77.0* 79.7* 79.3* 80.2  PLT 217 257 268 226 242   Cardiac Enzymes: No results for input(s): CKTOTAL, CKMB, CKMBINDEX, TROPONINI in the last 168 hours. BNP: Invalid input(s): POCBNP CBG: Recent Labs  Lab 10/24/21 0750 10/24/21 1146 10/24/21 1641 10/24/21 2143 10/25/21 0807  GLUCAP 193* 160* 152* 142* 145*   D-Dimer No results for input(s): DDIMER in the last 72 hours. Hgb A1c No results for input(s): HGBA1C in the last 72 hours. Lipid Profile No results for input(s): CHOL, HDL, LDLCALC, TRIG, CHOLHDL, LDLDIRECT in the last 72 hours. Thyroid function studies No results for input(s): TSH, T4TOTAL, T3FREE, THYROIDAB in the last 72 hours.  Invalid input(s): FREET3 Anemia work up No results for input(s): VITAMINB12, FOLATE, FERRITIN, TIBC, IRON, RETICCTPCT in the last 72 hours. Urinalysis No results found for: COLORURINE, APPEARANCEUR, [place], [place], GLUCOSEU, [place], [place], [place], PROTEINUR, UROBILINOGEN, NITRITE, LEUKOCYTESUR Sepsis Labs Invalid input(s): PROCALCITONIN,  WBC,  LACTICIDVEN Microbiology Recent Results (from the past 240 hour(s))  Surgical pcr screen     Status: Abnormal   Collection Time: 10/18/21  7:55 PM   Specimen: Nasal Mucosa; Nasal Swab  Result Value Ref Range Status   MRSA, PCR NEGATIVE NEGATIVE Final   Staphylococcus aureus POSITIVE (A) NEGATIVE Final    Comment: (NOTE) The Xpert SA Assay (FDA approved for NASAL specimens in patients 44 years of age and older), is one component of a comprehensive surveillance program. It is not intended to diagnose infection nor to guide or monitor treatment. Performed at [facility], [place] [address] [place], [place] [address]  Time coordinating discharge: Over 30 minutes  SIGNED:   Darliss Cheney, MD  [facility] 10/25/2021, 10:09 AM  If 7PM-7AM, please contact night-coverage [email or web address]

## 2021-10-25 NOTE — Progress Notes (Signed)
Inpatient Rehab Admissions Coordinator:  ° °I have a CIR bed for this Pt. Today. RN may call report to [phone]. ° °Brick Ketcher, MS, CCC-SLP °Rehab Admissions Coordinator  °[phone] (celll) °[phone] (office) ° °

## 2021-10-25 NOTE — Discharge Instructions (Signed)
Inpatient Rehab Discharge Instructions  [name] [name] Discharge date and time: No discharge date for patient encounter.   Activities/Precautions/ Functional Status: Activity: activity as tolerated Diet: diabetic diet Wound Care: Stump care antibacterial soap/Dial and water daily Functional status:  ___ No restrictions     ___ Walk up steps independently ___ 24/7 supervision/assistance   ___ Walk up steps with assistance ___ Intermittent supervision/assistance  ___ Bathe/dress independently ___ Walk with walker     _x__ Bathe/dress with assistance ___ Walk Independently    ___ Shower independently ___ Walk with assistance    ___ Shower with assistance ___ No alcohol     ___ Return to work/school ________  Special Instructions: No driving smoking or alcohol   My questions have been answered and I understand these instructions. I will adhere to these goals and the provided educational materials after my discharge from the hospital.  Patient/Caregiver Signature _______________________________ Date __________  Clinician Signature _______________________________________ Date __________  Please bring this form and your medication list with you to all your follow-up doctor's appointments.

## 2021-10-25 NOTE — H&P (Signed)
Physical Medicine and Rehabilitation Admission H&P        Chief Complaint  Patient presents with   Leg Pain  : HPI: [name] [name] is a 66 year old right-handed male with history of CVA maintained on aspirin and Plavix, asbestosis, hypertension, type 2 diabetes mellitus, OSA not on CPAP, obesity with BMI 41.28, diastolic congestive heart failure, chronic venous stasis and lymphedema of bilateral lower extremities followed by wound care.  Per chart review patient lives alone.  He does have a personal care attendant as well as good support from his daughter and sister.  PCA assist with ADLs.  Patient was using a cane for short distances but mostly wheelchair-bound.  1 level home with ramped entrance.  Presented 10/14/2021 with persistent left ankle pain since he twisted his ankle getting into his car approximately 1 month ago.  He was initially referred to [facility] x-rays negative for fracture.  Patient notes extensive edema venous stasis ulceration with purulent drainage and necrotic ulcers of left lower extremity.  Admission chemistries glucose 151 BUN 71 creatinine 1.74, WBC 12,000 hemoglobin 9.4 down from recent 13.3, lactic acid 1.4 FOBT positive.  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  Septic arthritis of the tibiotalar, subtalar and talonavicular joints.  Diffuse soft tissue swelling and subcutaneous edema.  Gastroenterology services consulted for rectal bleeding FOBT positive.  Patient underwent colonoscopy/EGD 10/19/2021 per Dr. Lorenso Courier that examined portion of ileum was normal there was a single solitary ulcer at the ileocecal valve.  Seven 3 to 7 mm polyps in the sigmoid colon and descending colon removed with a cold snare.  Nonbleeding internal hemorrhoids.  Patient was placed on PPI twice daily x8 weeks.  Patient's aspirin and Plavix initially held and has since been resumed 10/22/2021 with latest hemoglobin 7.9.  Follow-up orthopedic services  Dr. 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  Noted AKI baseline serum creatinine around 1.3 presentation 1.74-1.98-2.37-2.08 and did receive IV fluids x10 hours and monitored.  Blood pressure was somewhat soft hydralazine discontinued Toprol reduced to 50 mg to allow better perfusion for kidneys.  Therapy evaluations completed and due to patient decreased functional mobility was admitted for a comprehensive rehab program.   Pt reports R hand swelling since surgery-  Pain meds making him itch- but doesn't want to change them- says it's "not bad"- just need to scratch his back.  O2 is new- 2L- never had at home.  LBM 2 days ago- usually goes 1x/week.  Peeing OK.  Is R handed, but uses L hand a lot since stroke which affected his R side.      Review of Systems  Constitutional:  Positive for malaise/fatigue. Negative for chills and fever.  HENT:  Negative for hearing loss.   Eyes:  Negative for blurred vision and double vision.  Respiratory:  Negative for cough.        Shortness of breath with exertion  Cardiovascular:  Positive for leg swelling. Negative for chest pain and palpitations.  Gastrointestinal:  Positive for blood in stool and constipation. Negative for nausea and vomiting.  Genitourinary:  Negative for dysuria, flank pain and hematuria.  Musculoskeletal:  Positive for joint pain and myalgias.  Skin:  Negative for rash.  All other systems  reviewed and are negative.     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[facility])     Essential hypertension     History of nephrolithiasis     History of open leg wound     History of stroke July 2004   Obstructive sleep apnea     TIA (transient ischemic attack)     Varicose veins      Laser  ablation of left great saphenous vein 2009         Past Surgical History:  Procedure Laterality Date   CYSTOSCOPY WITH RETROGRADE PYELOGRAM, URETEROSCOPY AND STENT PLACEMENT Right 08/02/2015    Procedure: [place], URETEROSCOPY , STONE EXTRACTION AND STENT PLACEMENT;  Surgeon: Cleon Gustin, MD;  Location: [facility];  Service: Urology;  Laterality: Right;   HOLMIUM LASER APPLICATION Right 71/24/5809    Procedure: HOLMIUM LASER APPLICATION;  Surgeon: Cleon Gustin, MD;  Location: [facility];  Service: Urology;  Laterality: Right;   KIDNEY STONE SURGERY       VASECTOMY   1984   VEIN SURGERY Left 06/2010    Laser ablation         Family History  Problem Relation Age of Onset   Bone cancer Mother     Hypertension Father     Pancreatic cancer Maternal Grandmother     Stroke Paternal Grandmother     Diabetes Paternal Grandfather     Heart attack Brother     Hypertension Brother     Heart attack Sister      Social History:  reports that he has never smoked. He has never used smokeless tobacco. He reports that he does not drink alcohol and does not use drugs. Allergies: No Known Allergies       Medications Prior to Admission  Medication Sig Dispense Refill   acetaminophen (TYLENOL) 500 MG tablet Take 1,000 mg by mouth daily as needed for moderate pain.       allopurinol (ZYLOPRIM) 300 MG tablet Take 300 mg by mouth daily.       aspirin 325 MG tablet Take 325 mg by mouth daily.       atorvastatin (LIPITOR) 10 MG tablet Take 10 mg by mouth daily.       clopidogrel (PLAVIX) 75 MG tablet Take 75 mg by mouth daily.       diclofenac (VOLTAREN) 75 MG EC tablet Take 75 mg by mouth 2 (two) times daily.       furosemide (LASIX) 40 MG tablet Take 80 mg by mouth daily as needed for fluid or edema.       hydrALAZINE (APRESOLINE) 50 MG tablet TAKE 1 TABLET BY MOUTH TWICE A DAY. NEED OFFICE VISIT FOR FURTHER REFILLS (Patient taking differently: Take 50-100 mg by mouth 2  (two) times daily. Increased by Dr. Quintin Alto) 14 tablet 0   metoprolol succinate (TOPROL-XL) 100 MG 24 hr tablet Take 100 mg by mouth daily. Take with or immediately following a meal.       olmesartan-hydrochlorothiazide (BENICAR HCT) 40-25 MG tablet Take 1 tablet by mouth daily.   12   aspirin EC 81 MG tablet Take 1 tablet (81 mg total) by mouth daily. (Patient not taking: Reported on 04/09/2021) 90 tablet 3          Home: [place] expects to be discharged to:: Private residence Living Arrangements: Alone Available Help at Discharge: Neighbor, Friend(s), Family, Other (Comment), Personal care attendant Type of Home: [place]  Access: Ramped entrance Home Layout: One level Bathroom Shower/Tub: Art gallery manager: No Home Equipment: BSC/3in1, Wheelchair - power, Conservation officer, nature (2 wheels) Additional Comments: sleeps in lift chair   Functional History: Prior Function Prior Level of Function : Needs assist Mobility Comments: primarily uses power chiar ADLs Comments: PCA does ADLs, reports sponge bathing at baseline since December   Functional Status:  Mobility: Bed Mobility Overal bed mobility: Needs Assistance Bed Mobility: Supine to Sit, Sit to Supine Supine to sit: Mod assist, +2 for physical assistance, +2 for safety/equipment Sit to supine: Mod assist, +2 for physical assistance, +2 for safety/equipment General bed mobility comments: Mod A +2 to pull to sitting and bring BLE off the bed Transfers Overall transfer level: Needs assistance Transfers: Bed to chair/wheelchair/BSC Bed to/from chair/wheelchair/BSC transfer type:: Lateral/scoot transfer  Lateral/Scoot Transfers: Mod assist General transfer comment: Deferred due to pt weakness and fatigue Ambulation/Gait General Gait Details: unable to try   ADL: ADL Overall ADL's : Needs assistance/impaired Eating/Feeding: Set up, Sitting Grooming: Set up, Sitting Grooming Details (indicate  cue type and reason): pt completed shaving task sitting EOB > 20 min, some assist for throughness and due to small mirror/lower lighting Upper Body Bathing: Moderate assistance, Sitting Upper Body Bathing Details (indicate cue type and reason): to wash back sitting EOB Lower Body Bathing: Maximal assistance, +2 for safety/equipment, +2 for physical assistance, Bed level Upper Body Dressing : Minimal assistance, Sitting Upper Body Dressing Details (indicate cue type and reason): able to doff/don gown Lower Body Dressing: Total assistance, Bed level Lower Body Dressing Details (indicate cue type and reason): to don R sock Toilet Transfer: Total assistance, +2 for physical assistance, +2 for safety/equipment Toileting- Clothing Manipulation and Hygiene: Total assistance, +2 for physical assistance, +2 for safety/equipment, Bed level Functional mobility during ADLs: Moderate assistance, Maximal assistance, +2 for physical assistance General ADL Comments: Pt re-eval s/p BKA this session, continues to require increased assist for all ADL's, unable to stand this session due to weakness and pain   Cognition: Cognition Overall Cognitive Status: Within Functional Limits for tasks assessed Orientation Level: Oriented X4 Cognition Arousal/Alertness: Awake/alert Behavior During Therapy: WFL for tasks assessed/performed, Flat affect Overall Cognitive Status: Within Functional Limits for tasks assessed General Comments: slow speech but appropriate   Physical Exam: Blood pressure 105/65, pulse 69, temperature 98.4 F (36.9 C), temperature source Oral, resp. rate 19, height 5\' 10"  (1.778 m), weight (!) 159.2 kg, SpO2 95 %. Physical Exam Vitals and nursing note reviewed.  Constitutional:      Comments: Pt sitting up in bariatric low loss air mattress bed; computer in front of him; NAD- sparse speech  HENT:     Head: Normocephalic and atraumatic.     Comments: Missing many teeth- poor dentition     Right Ear: External ear normal.     Left Ear: External ear normal.     Nose: No congestion.     Mouth/Throat:     Mouth: Mucous membranes are dry.     Pharynx: Oropharynx is clear. No oropharyngeal exudate.  Eyes:     General:        Right eye: No discharge.        Left eye: No discharge.     Extraocular Movements: Extraocular movements intact.  Cardiovascular:     Rate and Rhythm: Normal rate and regular rhythm.     Heart sounds: Normal heart sounds. No murmur heard.   No gallop.  Pulmonary:     Comments:  Decreased breathing sounds- no W/R/R- - esp at bases-  Abdominal:     Comments: Protuberant vs distended; hypoactive BS; soft, NT  Genitourinary:    Comments: Swollen scrotum- severely Musculoskeletal:     Cervical back: Neck supple. No tenderness.     Comments: LUE 5/5 RUE- 4+/5 in same muscles LLE- 2/5 in HF/KE- has wound VAC in place on L BKA RLE- HF/KE/KF 2/5; DF and PF 4-/5   Skin:    General: Skin is warm.     Comments: Left BKA is dressed with wound VAC in place appropriately tender.  Significant lymphedema right lower extremity with wound dressing in place   Scrotum really swollen- MASD on buttocks- no pressure ulcers R calf- lympheedema- bleeding when aqualcel removed Dorsum of R foot- some wrinkling like fluid lost, but severely swollen on top of R foot- can barely see toes around it.  Very crusty R hand mod-severe swelling- not in L hand IV R upper forearm doesn't look infiltrated  Neurological:     Comments: Patient is a bit lethargic but arousable.  Makes eye contact with examiner.  Provides name and age but limited medical historian.   Pt delayed responses- scant speech, but answer questions- limited Decreased sensation in stocking pattern  Psychiatric:     Comments: Flat; sparse speech      Lab Results Last 48 Hours        Results for orders placed or performed during the hospital encounter of 10/14/21 (from the past 48 hour(s))  Glucose, capillary      Status: Abnormal    Collection Time: 10/22/21  8:22 AM  Result Value Ref Range    Glucose-Capillary 145 (H) 70 - 99 mg/dL      Comment: Glucose reference range applies only to samples taken after fasting for at least 8 hours.  Glucose, capillary     Status: Abnormal    Collection Time: 10/22/21 11:37 AM  Result Value Ref Range    Glucose-Capillary 158 (H) 70 - 99 mg/dL      Comment: Glucose reference range applies only to samples taken after fasting for at least 8 hours.  Glucose, capillary     Status: Abnormal    Collection Time: 10/22/21  4:32 PM  Result Value Ref Range    Glucose-Capillary 175 (H) 70 - 99 mg/dL      Comment: Glucose reference range applies only to samples taken after fasting for at least 8 hours.  Glucose, capillary     Status: Abnormal    Collection Time: 10/22/21  8:23 PM  Result Value Ref Range    Glucose-Capillary 176 (H) 70 - 99 mg/dL      Comment: Glucose reference range applies only to samples taken after fasting for at least 8 hours.  Basic metabolic panel     Status: Abnormal    Collection Time: 10/23/21 12:51 AM  Result Value Ref Range    Sodium 132 (L) 135 - 145 mmol/L    Potassium 5.4 (H) 3.5 - 5.1 mmol/L    Chloride 100 98 - 111 mmol/L    CO2 21 (L) 22 - 32 mmol/L    Glucose, Bld 158 (H) 70 - 99 mg/dL      Comment: Glucose reference range applies only to samples taken after fasting for at least 8 hours.    BUN 35 (H) 8 - 23 mg/dL    Creatinine, Ser 1.98 (H) 0.61 - 1.24 mg/dL    Calcium 7.6 (L) 8.9 - 10.3 mg/dL  GFR, Estimated 37 (L) >60 mL/min      Comment: (NOTE) Calculated using the CKD-EPI Creatinine Equation (2021)      Anion gap 11 5 - 15      Comment: Performed at [facility], [place] [address] [place], [place] [address]  CBC     Status: Abnormal    Collection Time: 10/23/21 12:51 AM  Result Value Ref Range    WBC 16.0 (H) 4.0 - 10.5 K/uL    RBC 3.24 (L) 4.22 - 5.81 MIL/uL    Hemoglobin 7.9 (L) 13.0 - 17.0 g/dL    HCT 25.7  (L) 39.0 - 52.0 %    MCV 79.3 (L) 80.0 - 100.0 fL    MCH 24.4 (L) 26.0 - 34.0 pg    MCHC 30.7 30.0 - 36.0 g/dL    RDW 19.6 (H) 11.5 - 15.5 %    Platelets 226 150 - 400 K/uL    nRBC 0.6 (H) 0.0 - 0.2 %      Comment: Performed at [facility], [place] [address], [place], [place] [address]  Glucose, capillary     Status: Abnormal    Collection Time: 10/23/21  7:43 AM  Result Value Ref Range    Glucose-Capillary 138 (H) 70 - 99 mg/dL      Comment: Glucose reference range applies only to samples taken after fasting for at least 8 hours.  Glucose, capillary     Status: Abnormal    Collection Time: 10/23/21 11:42 AM  Result Value Ref Range    Glucose-Capillary 162 (H) 70 - 99 mg/dL      Comment: Glucose reference range applies only to samples taken after fasting for at least 8 hours.  Glucose, capillary     Status: Abnormal    Collection Time: 10/23/21  5:43 PM  Result Value Ref Range    Glucose-Capillary 161 (H) 70 - 99 mg/dL      Comment: Glucose reference range applies only to samples taken after fasting for at least 8 hours.  Glucose, capillary     Status: Abnormal    Collection Time: 10/23/21  7:30 PM  Result Value Ref Range    Glucose-Capillary 169 (H) 70 - 99 mg/dL      Comment: Glucose reference range applies only to samples taken after fasting for at least 8 hours.      Imaging Results (Last 48 hours)  No results found.         Blood pressure 105/65, pulse 69, temperature 98.4 F (36.9 C), temperature source Oral, resp. rate 19, height 5\' 10"  (1.778 m), weight (!) 159.2 kg, SpO2 95 %.   Medical Problem List and Plan: 1. 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  Antithrombotics: -DVT/anticoagulation:  Mechanical: Antiembolism stockings, thigh (TED hose) Right lower extremity             -antiplatelet therapy: Aspirin 81 mg daily  and Plavix 75 mg daily 3. Pain Management: Hydrocodone as needed 4. Mood: Provide emotional support             -antipsychotic agents: N/A 5. Neuropsych: This patient is capable of making decisions on his own behalf. 6. Skin/Wound Care: Routine skin checks.  MASD on buttocks and lymphedema/venous  stasis ulcers on RLE- was told Pressure ulcer on buttocks, but not per WOC or nursing.   WOC follow-up wound care as directed. 7. Fluids/Electrolytes/Nutrition: Routine in and outs with follow-up chemistries 8.  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  Status post EGD and colonoscopy 10/19/2021.  Continue PPI x8 weeks. 9.  Acute blood loss anemia.  Follow-up CBC 10.  History of CVA.  Aspirin Plavix as prior to admission. 11.  AKI.  Baseline creatinine 1.3.   IV fluids until follow up labs AM.  Follow-up chemistries 12.  Hypertension.  Blood pressure somewhat soft with hydralazine discontinued Toprol decreased to 25 mg daily to help perfusion for kidneys. 13.  Diabetes mellitus.  Hemoglobin A1c 7.3.  Semglee initiated while in the hospital 10 units daily.  Check blood sugars before meals and at bedtime 14.  Morbid obesity.  BMI 50.22.  Dietary follow-up 15.  Hyperlipidemia.  Lipitor 16.  History of gout.  Zyloprim 300 mg daily.  Monitor for any gout flareups 17.  Acute on chronic diastolic congestive heart failure.  Monitor for any signs of fluid overload. 18. R hand swelling- suggest K tape for R hand 19. Scrotal swelling- severe- suggested to nurse to elevate on towel- might need scrotal sling, but usually hard to get in to.      I have personally performed a face to face diagnostic evaluation of this patient and formulated the key components of the plan.  Additionally, I have personally reviewed laboratory data, imaging studies, as well as relevant notes and concur with the physician assistant's documentation above.   The patient's  status has not changed from the original H&P.  Any changes in documentation from the acute care chart have been noted above.         Lavon Paganini Angiulli, PA-C 10/24/2021

## 2021-10-25 NOTE — Progress Notes (Addendum)
Inpatient Rehabilitation Admission Medication Review by a Pharmacist  A complete drug regimen review was completed for this patient to identify any potential clinically significant medication issues.  High Risk Drug Classes Is patient taking? Indication by Medication  Antipsychotic No   Anticoagulant No   Antibiotic No   Opioid Yes Hydrocodone for multiple sources of pain  Antiplatelet Yes ASA nd Plavix for CVA prophx.  Hypoglycemics/insulin Yes SSI, Semglee for DM  Vasoactive Medication Yes Toprol for HTN, CHF  Chemotherapy No   Other No      Type of Medication Issue Identified Description of Issue Recommendation(s)  Drug Interaction(s) (clinically significant)     Duplicate Therapy     Allergy     No Medication Administration End Date     Incorrect Dose     Additional Drug Therapy Needed  DVT proph? Lovenox 0.5mg /kg/d  Significant med changes from prior encounter (inform family/care partners about these prior to discharge). Not sure why glipizide is on inpatient discharge orders (pt not on this PTA or inpatient) Resume Lasix as needed  Other       Clinically significant medication issues were identified that warrant physician communication and completion of prescribed/recommended actions by midnight of the next day:  Yes  Name of provider notified for urgent issues identified: Dan Angiulli, PA>>currently on Antiembolism stockings, thigh (TED hose) Right lower extremity with Hgb down to 7.6 and Scr up to 2.08. Will monitor and add VTE prophx when stable.  Provider Method of Notification: chat  Pharmacist comments: high risk for DVT  Time spent performing this drug regimen review (minutes):  15 min  Estel Tonelli S. Alford Highland, PharmD, BCPS Clinical Staff Pharmacist [email or web address] [place] 10/25/2021 12:49 PM

## 2021-10-25 NOTE — Progress Notes (Signed)
PMR Admission Coordinator Pre-Admission Assessment   Patient: [name] [name] is an 66 y.o., male MRN: [id number] DOB: [birth year] Height: _0  (177.8 cm) Weight: (!) 159.2 kg   Insurance Information HMO:    PPO:      PCP:      IPA:      80/20: yes     OTHER:  PRIMARY: Medicare A and B       Policy#: [id number]      Subscriber: Pt.  Medicare       Policy#:       Subscriber: Pt. Phone#: Verified online    Fax#:  Pre-Cert#:       Employer:  Benefits:  Phone #:      Name:  Eff. Date: Parts A ad B effective  Deduct: $1600      Out of Pocket Max:  None      Life Max: N/A  CIR: 100%      SNF: 100 days Outpatient: 80%     Co-Pay: 20% Home Health: 100%      Co-Pay: none DME: 80%     Co-Pay: 20% Providers: patient's choice Providers: In network SECONDARY: [employer] commercial      Policy#: [id number]     Phone#:    Financial Counselor:       Phone#:    The Actuary for patients in Inpatient Rehabilitation Facilities with attached [place] Records was provided and verbally reviewed with: Patient   Emergency Contact Information Contact Information       Name Relation Home Work Mobile    Jobe,Cassie Daughter [phone]   [phone]           Current Medical History  Patient Admitting Diagnosis: BKA History of Present Illness: [name] [name] is a 66 year old right-handed male with 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  Per chart review patient lives alone.  He does have a personal care attendant as well as good support from his daughter and sister.  PCA assist with ADLs.  Patient was using a cane for short distances but mostly wheelchair-bound.  1 level home with ramped entrance.  Presented 10/14/2021 with  persistent left ankle pain since he twisted his ankle getting into his car approximately 1 month ago.  He was initially referred to [facility] x-rays negative for fracture.  Patient notes extensive edema venous stasis ulceration with purulent drainage and necrotic ulcers of left lower extremity.  Admission chemistries glucose 151 BUN 71 creatinine 1.74, WBC 12,000 hemoglobin 9.4 down from recent 13.3, lactic acid 1.4 FOBT positive.  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  Septic arthritis of the tibiotalar, subtalar and talonavicular joints.  Diffuse soft tissue swelling and subcutaneous edema.  Gastroenterology services consulted for rectal bleeding FOBT positive.  Patient underwent colonoscopy/EGD 10/19/2021 per Dr. Lorenso Courier that examined portion of ileum was normal there was a single solitary ulcer at the ileocecal valve.  Seven 3 to 7 mm polyps in the sigmoid colon and descending colon removed with a cold snare.  Nonbleeding internal hemorrhoids.  Patient was placed on PPI twice daily x8 weeks.  Patient's aspirin and Plavix initially held and has since been resumed 10/22/2021 with latest hemoglobin 7.9.  Follow-up orthopedic services Dr. 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  Noted AKI baseline serum creatinine around 1.3 presentation 1.74-1.98 and did receive IV fluids x10 hours and monitored.  Blood pressure was somewhat soft hydralazine discontinued Toprol reduced to 50 mg to allow better perfusion for kidneys.  Therapy evaluations completed and due to patient decreased functional mobility was admitted for a comprehensive rehab program.     Patient's medical record from [facility] has been reviewed by  the rehabilitation admission coordinator and physician.   Past Medical History      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[facility])     Essential hypertension     History of nephrolithiasis     History of open leg wound     History of stroke July 2004   Obstructive sleep apnea     TIA (transient ischemic attack)     Varicose veins      Laser ablation of left great saphenous vein 2009      Has the patient had major surgery during 100 days prior to admission? Yes   Family History   family history includes Bone cancer in his mother; Diabetes in his paternal grandfather; Heart attack in his brother and sister; Hypertension in his brother and father; Pancreatic cancer in his maternal grandmother; Stroke in his paternal grandmother.   Current Medications   Current Facility-Administered Medications:    0.9 %  sodium chloride infusion (Manually program via Guardrails IV Fluids), , Intravenous, Once, [name], MD   0.9 %  sodium chloride infusion (Manually program via Guardrails IV Fluids), , Intravenous, Once, [name], MD   0.9 %  sodium chloride infusion, , Intravenous, Continuous, [name], MD   0.9 %  sodium chloride infusion, , Intravenous, Continuous, Pahwani, Ravi, MD, Last Rate: 125 mL/hr at 10/23/21 1007, New Bag at 10/23/21 1007   acetaminophen (TYLENOL) tablet 325-650 mg, 325-650 mg, Oral, Q6H PRN, [name], MD   allopurinol (ZYLOPRIM) tablet 300 mg, 300 mg, Oral, Daily, [name], MD, 300 mg at 10/23/21 1006   alum & mag hydroxide-simeth (MAALOX/MYLANTA) 200-200-20 MG/5ML suspension 15-30 mL, 15-30 mL, Oral, Q2H PRN, [name], MD   ascorbic acid (VITAMIN C) tablet 1,000 mg, 1,000 mg, Oral, Daily, [name], MD, 1,000 mg at 10/23/21 1006   aspirin EC tablet 81 mg, 81 mg, Oral, Daily, Pahwani, Ravi, MD, 81 mg at 10/23/21 1006   atorvastatin (LIPITOR) tablet 10 mg, 10 mg, Oral, Daily, [name], MD, 10 mg at 10/23/21 1006    bisacodyl (DULCOLAX) EC tablet 5 mg, 5 mg, Oral, Daily PRN, [name], MD   clopidogrel (PLAVIX) tablet 75 mg, 75 mg, Oral, Daily, Pahwani, Ravi, MD, 75 mg at 10/23/21 1006   docusate sodium (COLACE) capsule 100 mg, 100 mg, Oral, BID, [name], MD, 100 mg at 10/23/21 1006   guaiFENesin-dextromethorphan (ROBITUSSIN DM) 100-10 MG/5ML syrup 15 mL, 15 mL, Oral, Q4H PRN, [name], MD   hydrALAZINE (APRESOLINE) injection 5 mg, 5 mg, Intravenous, Q20 Min PRN, [name], MD   HYDROcodone-acetaminophen (NORCO) 7.5-325 MG per tablet 1-2 tablet, 1-2 tablet, Oral, Q4H PRN, [name], MD, 2 tablet at 10/22/21 1945   HYDROcodone-acetaminophen (NORCO/VICODIN) 5-325 MG per tablet  1-2 tablet, 1-2 tablet, Oral, Q4H PRN, [name], MD, 2 tablet at 10/22/21 1515   HYDROmorphone (DILAUDID) injection 0.5 mg, 0.5 mg, Intravenous, Q4H PRN, [name], MD, 0.5 mg at 10/19/21 0611   insulin aspart (novoLOG) injection 0-15 Units, 0-15 Units, Subcutaneous, TID WC, [name], MD, 2 Units at 10/23/21 0814   insulin glargine-yfgn (SEMGLEE) injection 10 Units, 10 Units, Subcutaneous, Daily, [name], MD, 10 Units at 10/23/21 1005   labetalol (NORMODYNE) injection 10 mg, 10 mg, Intravenous, Q10 min PRN, [name], MD   magnesium citrate solution 1 Bottle, 1 Bottle, Oral, Once PRN, [name], MD   magnesium sulfate IVPB 2 g 50 mL, 2 g, Intravenous, Daily PRN, [name], MD   metoprolol succinate (TOPROL-XL) 24 hr tablet 50 mg, 50 mg, Oral, Daily, Pahwani, Ravi, MD, 50 mg at 10/23/21 1016   metoprolol tartrate (LOPRESSOR) injection 2-5 mg, 2-5 mg, Intravenous, Q2H PRN, [name], MD   morphine (PF) 2 MG/ML injection 0.5-1 mg, 0.5-1 mg, Intravenous, Q2H PRN, [name], MD   mupirocin ointment (BACTROBAN) 2 % 1 application, 1 application, Nasal, BID, [name], MD, 1 application at 89/16/94 1012   nutrition supplement (JUVEN) (JUVEN) powder packet 1 packet, 1  packet, Oral, BID BM, [name], MD   ondansetron (ZOFRAN) tablet 4 mg, 4 mg, Oral, Q6H PRN **OR** ondansetron (ZOFRAN) injection 4 mg, 4 mg, Intravenous, Q6H PRN, [name], MD   pantoprazole (PROTONIX) EC tablet 40 mg, 40 mg, Oral, BID, [name], MD, 40 mg at 10/23/21 1006   phenol (CHLORASEPTIC) mouth spray 1 spray, 1 spray, Mouth/Throat, PRN, [name], MD   polyethylene glycol (MIRALAX / GLYCOLAX) packet 17 g, 17 g, Oral, BID, [name], MD, 17 g at 10/23/21 1006   polyethylene glycol (MIRALAX / GLYCOLAX) packet 17 g, 17 g, Oral, Daily PRN, [name], MD   potassium chloride SA (KLOR-CON M) CR tablet 20-40 mEq, 20-40 mEq, Oral, Daily PRN, [name], MD   zinc sulfate capsule 220 mg, 220 mg, Oral, Daily, [name], MD, 220 mg at 10/23/21 1006   Patients Current Diet:  Diet Order                  Diet Carb Modified Fluid consistency: Thin; Room service appropriate? Yes  Diet effective now                         Precautions / Restrictions Precautions Precautions: Fall, Other (comment) Precaution Comments: new L BKA Restrictions Weight Bearing Restrictions: Yes LLE Weight Bearing: Non weight bearing    Has the patient had 2 or more falls or a fall with injury in the past year? Yes   Prior Activity Level Community (5-7x/wk): Pt. active in the community PTA   Prior Functional Level Self Care: Did the patient need help bathing, dressing, using the toilet or eating? Independent   Indoor Mobility: Did the patient need assistance with walking from room to room (with or without device)? Independent   Stairs: Did the patient need assistance with internal or external stairs (with or without device)? Independent   Functional Cognition: Did the patient need help planning regular tasks such as shopping or remembering to take medications? Independent   Patient Information Are you of Hispanic, Latino/a,or Spanish origin?: A. No, not of  Hispanic, Latino/a, or Spanish origin What is your  race?: A. White Do you need or want an interpreter to communicate with a doctor or health care staff?: 0. No   Patient's Response To:  Health Literacy and Transportation Is the patient able to respond to health literacy and transportation needs?: Yes Health Literacy - How often do you need to have someone help you when you read instructions, pamphlets, or other written material from your doctor or pharmacy?: Never In the past 12 months, has lack of transportation kept you from medical appointments or from getting medications?: No In the past 12 months, has lack of transportation kept you from meetings, work, or from getting things needed for daily living?: No   Home Assistive Devices / [place] Devices/Equipment: Environmental consultant (specify type), Wheelchair Home Equipment: BSC/3in1, Wheelchair - power, Conservation officer, nature (2 wheels)   Prior Device Use: Indicate devices/aids used by the patient prior to current illness, exacerbation or injury? None of the above   Current Functional Level Cognition   Overall Cognitive Status: Within Functional Limits for tasks assessed Orientation Level: Oriented X4 General Comments: slow speech but appropriate    Extremity Assessment (includes Sensation/Coordination)   Upper Extremity Assessment: RUE deficits/detail RUE Deficits / Details: hx of CVA and residual weakness, unable to lift UE beyond 90*, PROM WFL RUE Sensation: decreased light touch RUE Coordination: decreased fine motor, decreased gross motor  Lower Extremity Assessment: Defer to PT evaluation RLE Deficits / Details: Erythema, edema, grossly weak LLE Deficits / Details: LLE ulceration with purulent drainage/weeping, gross edema, erythema, foot deformity     ADLs   Overall ADL's : Needs assistance/impaired Eating/Feeding: Set up, Sitting Grooming: Set up, Sitting Grooming Details (indicate cue type and reason): pt completed shaving  task sitting EOB > 20 min, some assist for throughness and due to small mirror/lower lighting Upper Body Bathing: Moderate assistance, Sitting Upper Body Bathing Details (indicate cue type and reason): to wash back sitting EOB Lower Body Bathing: Maximal assistance, +2 for safety/equipment, +2 for physical assistance, Bed level Upper Body Dressing : Minimal assistance, Sitting Upper Body Dressing Details (indicate cue type and reason): able to doff/don gown Lower Body Dressing: Total assistance, Bed level Lower Body Dressing Details (indicate cue type and reason): to don R sock Toilet Transfer: Total assistance, +2 for physical assistance, +2 for safety/equipment Toileting- Clothing Manipulation and Hygiene: Total assistance, +2 for physical assistance, +2 for safety/equipment, Bed level Functional mobility during ADLs: Moderate assistance, Maximal assistance, +2 for physical assistance General ADL Comments: Pt re-eval s/p BKA this session, continues to require increased assist for all ADL's, unable to stand this session due to weakness and pain     Mobility   Overal bed mobility: Needs Assistance Bed Mobility: Supine to Sit, Sit to Supine Supine to sit: Mod assist, +2 for physical assistance, +2 for safety/equipment Sit to supine: Mod assist, +2 for physical assistance, +2 for safety/equipment General bed mobility comments: Mod A +2 to pull to sitting and bring BLE off the bed     Transfers   Overall transfer level: Needs assistance Transfers: Bed to chair/wheelchair/BSC Bed to/from chair/wheelchair/BSC transfer type:: Lateral/scoot transfer  Lateral/Scoot Transfers: Mod assist General transfer comment: Deferred due to pt weakness and fatigue     Ambulation / Gait / Stairs / Wheelchair Mobility   Ambulation/Gait General Gait Details: unable to try     Posture / Balance Dynamic Sitting Balance Sitting balance - Comments: initially required mod assist to maintain but began to flex into  it to use UE's and  support Balance Overall balance assessment: Needs assistance Sitting-balance support: Feet supported, Bilateral upper extremity supported (RLE) Sitting balance-Leahy Scale: Poor Sitting balance - Comments: initially required mod assist to maintain but began to flex into it to use UE's and support Postural control: Posterior lean     Special needs/care consideration Wound Vac Left and Skin cellulitis R  leg,excoriation on bilateral buttocks, weeping on R leg.     Previous Home Environment (from acute therapy documentation) Living Arrangements: Alone Available Help at Discharge: Neighbor, Friend(s), Family, Other (Comment), Personal care attendant Type of Home: House Home Layout: One level Home Access: Ramped entrance Bathroom Shower/Tub: Holiday representative Accessibility: No Home Care Services: Yes Type of Home Care Services: Home RN Additional Comments: sleeps in lift chair   Discharge Living Setting Plans for Discharge Living Setting: Alone Type of Home at Discharge: [place]: One level Discharge Home Access: [place] entrance Discharge Bathroom Shower/Tub: Walk-in shower Discharge Bathroom Toilet: Handicapped height Discharge Bathroom Accessibility: Yes How Accessible: Accessible via walker Does the patient have any problems obtaining your medications?: No   Social/Family/Support Systems Patient Roles: Spouse, Other (Comment) Contact Information: [phone] Anticipated Caregiver: August Albino Ability/Limitations of Caregiver: Can provide Min A Caregiver Availability: 24/7 Discharge Plan Discussed with Primary Caregiver: Yes Is Caregiver In Agreement with Plan?: Yes Does Caregiver/Family have Issues with Lodging/Transportation while Pt is in Rehab?: No   Goals Patient/Family Goal for Rehab: PT/OT Min guard Expected length of stay: 12-14 days Pt/Family Agrees to Admission and willing to participate: Yes Program Orientation  Provided & Reviewed with Pt/Caregiver Including Roles  & Responsibilities: Yes   Decrease burden of Care through IP rehab admission: Specialzed equipment needs, Decrease number of caregivers, Bowel and bladder program, and Patient/family education   Possible need for SNF placement upon discharge: not anticipated    Patient Condition: I have reviewed medical records from [facility], spoken with CM, and patient. I met with patient at the bedside for inpatient rehabilitation assessment.  Patient will benefit from ongoing PT and OT, can actively participate in 3 hours of therapy a day 5 days of the week, and can make measurable gains during the admission.  Patient will also benefit from the coordinated team approach during an Inpatient Acute Rehabilitation admission.  The patient will receive intensive therapy as well as Rehabilitation physician, nursing, social worker, and care management interventions.  Due to safety, skin/wound care, disease management, medication administration, pain management, and patient education the patient requires 24 hour a day rehabilitation nursing.  The patient is currently mod-max A  with mobility and basic ADLs.  Discharge setting and therapy post discharge at home with home health is anticipated.  Patient has agreed to participate in the Acute Inpatient Rehabilitation Program and will admit today.   Preadmission Screen Completed By:  Genella Mech, 10/23/2021 10:21 AM ______________________________________________________________________   Discussed status with Dr. Letta Pate  on 10/24/21 at 1000 and received approval for admission today.   Admission Coordinator:  Genella Mech, CCC-SLP, time 1110/Date 10/24/21   Assessment/Plan: Diagnosis:Left BKA 10/21/21 Does the need for close, 24 hr/day Medical supervision in concert with the patient's rehab needs make it unreasonable for this patient to be served in a less intensive setting? Yes Co-Morbidities requiring  supervision/potential complications: CHF with chronic LE edema, DM 2, Morbid obesity BMI 50, hx CVA with chronic Right hemiparesis Due to bladder management, bowel management, safety, skin/wound care, disease management, medication administration, pain management, and patient education, does the  patient require 24 hr/day rehab nursing? Yes Does the patient require coordinated care of a physician, rehab nurse, PT, OT, and SLP to address physical and functional deficits in the context of the above medical diagnosis(es)? Yes Addressing deficits in the following areas: balance, endurance, locomotion, strength, transferring, bowel/bladder control, bathing, dressing, feeding, grooming, toileting, cognition, and psychosocial support Can the patient actively participate in an intensive therapy program of at least 3 hrs of therapy 5 days a week? Yes The potential for patient to make measurable gains while on inpatient rehab is good Anticipated functional outcomes upon discharge from inpatient rehab: supervision and min assist PT, supervision and min assist OT, modified independent SLP Estimated rehab length of stay to reach the above functional goals is: 12-14d Anticipated discharge destination: Home 10. Overall Rehab/Functional Prognosis: fair     MD Signature: Charlett Blake M.D. [facility] Fellow Am Acad of Phys Med and Rehab Diplomate Am Board of Electrodiagnostic Med Fellow Am Board of Interventional Pain

## 2021-10-25 NOTE — Progress Notes (Signed)
Patient ID: [name] [name], male   DOB: 1955-12-07, 66 y.o.   MRN: [id number] Met with the patient to review role, rehab process, team conference and plan of care. Discussed skin care needs, wound vac, medications, including insulin administration and dietary modifications, protein sources. Continue to follow along to discharge to address educational needs and facilitate discharge home with friend. Patient reports friend is a diabetic and able to administer insulin for him. [name]

## 2021-10-25 NOTE — Progress Notes (Signed)
Patient arrived on unit. Rehab schedule, medications and plan of care reviewed, patient states an understanding. Patient is Ax4 and has no complications noted at this time. Patient reports pain 0 out of 10. Patient educated on use of call light. [name] [name] G [name] [name]  

## 2021-10-25 NOTE — Consult Note (Signed)
WOC Nurse Consult Note: Patient receiving care in [facility] [phone]. Primary nurse at bedside at time of my assessment. Reason for Consult: stage 2 to buttocks, stasis ulcers LE. Wound type: scattered, mild fungal overgrowth to back, buttocks, posterior upper thighs, intergluteal cleft. No PI to areas. Skin is intact and blanchable to all sites.  Chronic stasis ulcer to RLE. Present on arrival to inpatient care area with treatment plan in place. Current therapy a Aquacel and ABD pads secured with kerlix should be continued. Pressure Injury POA: Yes/No/NA Measurement: Wound bed: RLE stasis ulcers are beefy red Drainage (amount, consistency, odor) bloody on existing Aquacel dressings Periwound: hemosiderin staining and edema present. No current ABI present in record. Dressing procedure/placement/frequency: Twice daily application of Antifungal powder to affected areas. Continue use of Aquacel and ABD to RLE.  Thank you for the consult.  Discussed plan of care with the patient and bedside nurse.  [place] nurse will not follow at this time.  Please re-consult the [place] team if needed.  Val Riles, RN, MSN, CWOCN, CNS-BC, pager [phone]

## 2021-10-25 NOTE — Progress Notes (Signed)
Patient refused CPAP. No unit in room at this time. 

## 2021-10-26 DIAGNOSIS — I739 Peripheral vascular disease, unspecified: Secondary | ICD-10-CM | POA: Diagnosis not present

## 2021-10-26 DIAGNOSIS — I10 Essential (primary) hypertension: Secondary | ICD-10-CM | POA: Diagnosis not present

## 2021-10-26 DIAGNOSIS — E1169 Type 2 diabetes mellitus with other specified complication: Secondary | ICD-10-CM

## 2021-10-26 DIAGNOSIS — E669 Obesity, unspecified: Secondary | ICD-10-CM

## 2021-10-26 DIAGNOSIS — N179 Acute kidney failure, unspecified: Secondary | ICD-10-CM

## 2021-10-26 DIAGNOSIS — Z89512 Acquired absence of left leg below knee: Secondary | ICD-10-CM | POA: Diagnosis not present

## 2021-10-26 LAB — BASIC METABOLIC PANEL
Anion gap: 8 (ref 5–15)
BUN: 32 mg/dL — ABNORMAL HIGH (ref 8–23)
CO2: 24 mmol/L (ref 22–32)
Calcium: 7.9 mg/dL — ABNORMAL LOW (ref 8.9–10.3)
Chloride: 105 mmol/L (ref 98–111)
Creatinine, Ser: 1.74 mg/dL — ABNORMAL HIGH (ref 0.61–1.24)
GFR, Estimated: 43 mL/min — ABNORMAL LOW (ref >60)
Glucose, Bld: 135 mg/dL — ABNORMAL HIGH (ref 70–99)
Potassium: 4.7 mmol/L (ref 3.5–5.1)
Sodium: 137 mmol/L (ref 135–145)

## 2021-10-26 LAB — GLUCOSE, CAPILLARY
Glucose-Capillary: 130 mg/dL — ABNORMAL HIGH (ref 70–99)
Glucose-Capillary: 121 mg/dL — ABNORMAL HIGH (ref 70–99)
Glucose-Capillary: 169 mg/dL — ABNORMAL HIGH (ref 70–99)
Glucose-Capillary: 110 mg/dL — ABNORMAL HIGH (ref 70–99)

## 2021-10-26 MED ORDER — GABAPENTIN 100 MG PO CAPS
100.0000 mg | ORAL_CAPSULE | Freq: Two times a day (BID) | ORAL | Status: DC
  Administered 2021-10-26 – 2021-11-15 (×41): 100 mg via ORAL
  Filled 2021-10-26 (×41): qty 1

## 2021-10-26 NOTE — Progress Notes (Signed)
Inpatient Rehabilitation  Patient information reviewed and entered into eRehab system by Micalah Cabezas Ilanna Deihl, OTR/L.   Information including medical coding, functional ability and quality indicators will be reviewed and updated through discharge.    

## 2021-10-26 NOTE — Progress Notes (Signed)
Occupational Therapy Session Note  Patient Details  Name: [name] [name] MRN: [id number] Date of Birth: October 10, 1955  Today's Date: 10/26/2021 OT Individual Time: 1420-1536 OT Individual Time Calculation (min): 76 min    Short Term Goals: Week 1:  OT Short Term Goal 1 (Week 1): Pt will complete BSC/toilet transfer with Mod x2 OT Short Term Goal 2 (Week 1): Pt will perform LB dress with Max A with AE PRN OT Short Term Goal 3 (Week 1): Pt will participate in task of choice/ADL for 10 minutes without rest break  Skilled Therapeutic Interventions/Progress Updates:  Pt greeted supine in bed  agreeable to OT intervention. Session focus on functional mobility, dynamic sitting balance, lateral scoots in prep for SB transfer, increasing overall activity tolerance/ endurance, and BUE strengthening and decreasing overall caregiver burden. Pt completed supine>sit with MAX A +2 to R side of bed. Pt completed below therex from EOB with level 3 theraband: X10 shoulder flexion  X10 bicep curls X10 shoulder horizontal ABD X10 shoulder diagonal pulls X10 shoulder extension  X10 alternating punches X10 bilateral shoulder external rotation   Issued pt written HEP to increase carryover  Pt completed weighted ball tosses with tech for 2 rounds of 2 mins each as well as reaches with weighted ball ( 3lb) in PNF patterns to promote muscular endurance for higher level functional mobility tasks.  Attempted lateral scoot along EOB with pt needing total A +2 to scoot to [facility] with pt noted to slip forward, total A +2 to return pt to supine for safety, MAX A +2 to reposition pt in bed with pt able to assist with pulling up to [facility]. Worked on increasing activity tolerance and endurance from long sitting position in bed via ball tosses with pt using 2lb dowel rod to tap ball back and forth for a total of 6 mins. Pt also able to pull UB forward off back of bed with BUEs positioned on bed rails 2x10 reps. Pt completed 2x10 chest  presses with weighted ball.   pt left supine in bed with all needs within reach.                       Pt on RA during session with SpO2 >93% Therapy Documentation Precautions:  Precautions Precautions: Fall Precaution Comments: new L BKA, pressure sores on L/R bottom, 2L O2, would vac Restrictions Weight Bearing Restrictions: Yes LLE Weight Bearing: Non weight bearing    Pain: no pain reported during session     Therapy/Group: Individual Therapy  Precious Haws 10/26/2021, 3:50 PM

## 2021-10-26 NOTE — Evaluation (Signed)
Physical Therapy Assessment and Plan  Patient Details  Name: [name] [name] MRN: [id number] Date of Birth: 07-09-1956  PT Diagnosis: Abnormal posture, Abnormality of gait, Hemiparesis dominant, and Muscle weakness Rehab Potential: Good ELOS: 12-14 days   Today's Date: 10/26/2021 PT Individual Time: 9323-5573 PT Individual Time Calculation (min): 70 min    Hospital Problem: Principal Problem:   Left below-knee amputee [facility])   Past Medical History:  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[place])    Essential hypertension    History of nephrolithiasis    History of open leg wound    History of stroke July 2004   Obstructive sleep apnea    TIA (transient ischemic attack)    Varicose veins    Laser ablation of left great saphenous vein 2009   Past Surgical History:  Past Surgical History:  Procedure Laterality Date   AMPUTATION Left 10/21/2021   Procedure: LEFT BELOW KNEE AMPUTATION;  Surgeon: [name], MD;  Location: [place];  Service: Orthopedics;  Laterality: Left;   BIOPSY  10/19/2021   Procedure: BIOPSY;  Surgeon: Sharyn Creamer, MD;  Location: [place];  Service: Gastroenterology;;   COLONOSCOPY N/A 10/19/2021   Procedure: COLONOSCOPY;  Surgeon: Sharyn Creamer, MD;  Location: [place];  Service: Gastroenterology;  Laterality: N/A;   CYSTOSCOPY WITH RETROGRADE PYELOGRAM, URETEROSCOPY AND STENT PLACEMENT Right 08/02/2015   Procedure: CYSTOSCOPY WITH RETROGRADE PYELOGRAM, URETEROSCOPY , STONE EXTRACTION AND STENT PLACEMENT;  Surgeon: Cleon Gustin, MD;  Location: [facility];  Service: Urology;  Laterality: Right;   ESOPHAGOGASTRODUODENOSCOPY (EGD) WITH PROPOFOL N/A 10/19/2021   Procedure: ESOPHAGOGASTRODUODENOSCOPY (EGD) WITH PROPOFOL;  Surgeon: Sharyn Creamer, MD;  Location: [place];  Service: Gastroenterology;  Laterality: N/A;   HOLMIUM LASER APPLICATION Right 22/10/5425   Procedure: HOLMIUM LASER APPLICATION;  Surgeon: Cleon Gustin, MD;   Location: [facility];  Service: Urology;  Laterality: Right;   KIDNEY STONE SURGERY     POLYPECTOMY  10/19/2021   Procedure: POLYPECTOMY;  Surgeon: Sharyn Creamer, MD;  Location: [facility] ENDOSCOPY;  Service: Gastroenterology;;   [place] Left 06/2010   Laser ablation    Assessment & Plan Clinical Impression: Patient is a 66 y.o. year old male with history of CVA maintained on aspirin and Plavix, asbestosis, hypertension, type 2 diabetes mellitus, OSA not on CPAP, obesity with BMI 06.23, diastolic congestive heart failure, chronic venous stasis and lymphedema of bilateral lower extremities followed by wound care.  Per chart review patient lives alone.  He does have a personal care attendant as well as good support from his daughter and sister.  PCA assist with ADLs.  Patient was using a cane for short distances but mostly wheelchair-bound.  1 level home with ramped entrance.  Presented 10/14/2021 with persistent left ankle pain since he twisted his ankle getting into his car approximately 1 month ago.  He was initially referred to [facility] x-rays negative for fracture.  Patient notes extensive edema venous stasis ulceration with purulent drainage and necrotic ulcers of left lower extremity.  Admission chemistries glucose 151 BUN 71 creatinine 1.74, WBC 12,000 hemoglobin 9.4 down from recent 13.3, lactic acid 1.4 FOBT positive.  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  Septic arthritis of the tibiotalar, subtalar and talonavicular joints.  Diffuse soft tissue swelling and subcutaneous edema.  Gastroenterology services consulted for rectal bleeding FOBT positive.  Patient underwent colonoscopy/EGD 10/19/2021 per Dr. Lorenso Courier  that examined portion of ileum was normal there was a single solitary ulcer at the ileocecal valve.  Seven 3 to 7 mm polyps in the sigmoid colon and descending colon removed with a cold snare.  Nonbleeding internal hemorrhoids.   Patient was placed on PPI twice daily x8 weeks.  Patient's aspirin and Plavix initially held and has since been resumed 10/22/2021 with latest hemoglobin 7.9.  Follow-up orthopedic services Dr. 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  Noted AKI baseline serum creatinine around 1.3 presentation 1.74-1.98-2.37-2.08 and did receive IV fluids x10 hours and monitored.  Blood pressure was somewhat soft hydralazine discontinued Toprol reduced to 50 mg to allow better perfusion for kidneys.  Therapy evaluations completed and due to patient decreased functional mobility was admitted for a comprehensive rehab program.  Patient currently requires max with mobility secondary to muscle weakness, decreased cardiorespiratoy endurance, and decreased sitting balance, decreased standing balance, decreased postural control, hemiplegia, decreased balance strategies, and difficulty maintaining precautions.  Prior to hospitalization, patient was min with mobility and lived with Alone in a House home.  Home access is  Ramped entrance.  Patient will benefit from skilled PT intervention to maximize safe functional mobility, minimize fall risk, and decrease caregiver burden for planned discharge home with 24 hour assist.  Anticipate patient will benefit from follow up [facility] at discharge.  PT - End of Session Activity Tolerance: Tolerates 10 - 20 min activity with multiple rests Endurance Deficit: Yes PT Assessment Rehab Potential (ACUTE/IP ONLY): Good PT Barriers to Discharge: Decreased caregiver support;Home environment access/layout;Wound Care;Weight;Weight bearing restrictions;New oxygen PT Patient demonstrates impairments in the following area(s):  Balance;Behavior;Edema;Endurance;Motor;Nutrition;Pain;Perception;Safety;Sensory;Skin Integrity PT Transfers Functional Problem(s): Bed Mobility;Bed to Chair;Car;Furniture PT Locomotion Functional Problem(s): Wheelchair Mobility;Ambulation PT Plan PT Intensity: Minimum of 1-2 x/day ,45 to 90 minutes PT Frequency: 5 out of 7 days PT Duration Estimated Length of Stay: 12-14 days PT Treatment/Interventions: Ambulation/gait training;Discharge planning;Functional mobility training;Psychosocial support;Therapeutic Activities;Visual/perceptual remediation/compensation;Balance/vestibular training;Disease management/prevention;Neuromuscular re-education;Skin care/wound management;Therapeutic Exercise;Wheelchair propulsion/positioning;Cognitive remediation/compensation;DME/adaptive equipment instruction;Pain management;Splinting/orthotics;UE/LE Strength taining/ROM;Community reintegration;Functional electrical stimulation;Patient/family education;Stair training;UE/LE Coordination activities PT Transfers Anticipated Outcome(s): spv PT Locomotion Anticipated Outcome(s): n/a PT Recommendation Recommendations for Other Services: Therapeutic Recreation consult Therapeutic Recreation Interventions: Stress management;Pet therapy Follow Up Recommendations: Home health PT;24 hour supervision/assistance Patient destination: Home Equipment Recommended: To be determined   PT Evaluation Precautions/Restrictions Precautions Precautions: Fall;Other (comment) Precaution Comments: new L BKA, pressure sores on L/R bottom, 2L O2, would vac Restrictions Weight Bearing Restrictions: Yes LLE Weight Bearing: Non weight bearing General   Vital Signs  Pain Pain Assessment Pain Scale: 0-10 Pain Score: 0-No pain Pain Interference Pain Interference Pain Effect on Sleep: 0. Does not apply - I have not had any pain or hurting in the past 5 days Pain Interference with Therapy Activities: 1. Rarely or not at all Pain  Interference with Day-to-Day Activities: 1. Rarely or not at all Home Living/Prior [place]: Alone Available Help at Discharge: Neighbor;Friend(s);Family;Personal care attendant;Available PRN/intermittently Type of Home: House Home Access: Ramped entrance Home Layout: One level Bathroom Shower/Tub: Holiday representative Accessibility: No Additional Comments: sleeps in lift chair, mainly using SPC for mobility and power wc for community access; reports considering d/cing to gf's house (building a ramp), has a lift chair there to sleep in as  well  Lives With: Alone Prior Function Level of Independence: Needs assistance with gait;Needs assistance with tranfers;Needs assistance with ADLs;Needs assistance with homemaking  Able to Take Stairs?: No Driving: Yes Vision/Perception  Vision - History Ability to See in Adequate Light: 0 Adequate Perception Perception: Within Functional Limits Praxis Praxis: Intact  Cognition Overall Cognitive Status: Within Functional Limits for tasks assessed Arousal/Alertness: Awake/alert Orientation Level: Oriented X4 Awareness: Appears intact Problem Solving: Appears intact Safety/Judgment: Appears intact Sensation Sensation Light Touch: Appears Intact Hot/Cold: Not tested Proprioception: Appears Intact Stereognosis: Appears Intact Additional Comments: phantom pain/sensation to LRL Coordination Gross Motor Movements are Fluid and Coordinated: No Fine Motor Movements are Fluid and Coordinated: No Coordination and Movement Description: limited AROM RLE due to weakness and edema, limited AROM L at hip/knee due to pain/weakness Finger Nose Finger Test: slowed and limited ROM R UE due to previous CVA Heel Shin Test: limited AROM R LE Motor  Motor Motor: Hemiplegia;Abnormal postural alignment and control Motor - Skilled Clinical Observations: 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 in 04   Trunk/Postural Assessment  Cervical  Assessment Cervical Assessment: Within Functional Limits Thoracic Assessment Thoracic Assessment: Exceptions to [facility] (kyphosis) Lumbar Assessment Lumbar Assessment: Exceptions to [facility] (posterior pelvic tilt) Postural Control Postural Control: Deficits on evaluation Righting Reactions: delayed and inadequate Protective Responses: delayed and inadequate  Balance Balance Balance Assessed: Yes Static Sitting Balance Static Sitting - Balance Support: Bilateral upper extremity supported Static Sitting - Level of Assistance: 4: Min assist Dynamic Sitting Balance Dynamic Sitting - Balance Support: Bilateral upper extremity supported;During functional activity Dynamic Sitting - Level of Assistance: 3: Mod assist Extremity Assessment      RLE Assessment RLE Assessment: Exceptions to [facility] RLE Strength Right Hip Flexion: 2/5 Right Hip ABduction: 2/5 Right Hip ADduction: 2/5 Right Knee Flexion: 2/5 Right Knee Extension: 2/5 Right Ankle Dorsiflexion: 2/5 Right Ankle Plantar Flexion: 2/5 LLE Assessment LLE Assessment: Exceptions to [facility] LLE Strength Left Hip Flexion: 2/5 Left Hip ABduction: 2/5 Left Hip ADduction: 2/5 Left Knee Flexion: 2-/5 Left Knee Extension: 2-/5  Care Tool Care Tool Bed Mobility Roll left and right activity Roll left and right activity did not occur: N/A Roll left and right assist level:  (sleeps in lift chair)    Sit to lying activity Sit to lying activity did not occur: N/A Sit to lying assist level:  (sleps in lift chair)    Lying to sitting on side of bed activity Lying to sitting on side of bed activity did not occur: the ability to move from lying on the back to sitting on the side of the bed with no back support.: N/A Lying to sitting on side of bed assist level: the ability to move from lying on the back to sitting on the side of the bed with no back support.:  (sleeps in lift chair)     Care Tool Transfers Sit to stand transfer Sit to stand activity  did not occur: Safety/medical concerns      Chair/bed transfer   Chair/bed transfer assist level: 2 Armed forces training and education officer transfer activity did not occur: Safety/medical concerns      Scientist, product/process development transfer activity did not occur: Safety/medical concerns        Care Tool Locomotion Ambulation Ambulation activity did not occur: Safety/medical concerns        Walk 10 feet activity Walk 10 feet activity did not occur: Safety/medical concerns       Walk 50 feet with  2 turns activity Walk 50 feet with 2 turns activity did not occur: Safety/medical concerns      Walk 150 feet activity Walk 150 feet activity did not occur: Safety/medical concerns      Walk 10 feet on uneven surfaces activity Walk 10 feet on uneven surfaces activity did not occur: Safety/medical concerns      Stairs Stair activity did not occur: Safety/medical concerns        Walk up/down 1 step activity Walk up/down 1 step or curb (drop down) activity did not occur: Safety/medical concerns      Walk up/down 4 steps activity Walk up/down 4 steps activity did not occur: Safety/medical concerns      Walk up/down 12 steps activity Walk up/down 12 steps activity did not occur: Safety/medical concerns      Pick up small objects from floor Pick up small object from the floor (from standing position) activity did not occur: Safety/medical concerns      Wheelchair Is the patient using a wheelchair?: Yes Type of Wheelchair: Manual   Wheelchair assist level: Moderate Assistance - Patient 50 - 74% Max wheelchair distance: 150  Wheel 50 feet with 2 turns activity   Assist Level: Moderate Assistance - Patient 50 - 74%  Wheel 150 feet activity   Assist Level: Moderate Assistance - Patient 50 - 74%    Refer to Care Plan for Long Term Goals  SHORT TERM GOAL WEEK 1 PT Short Term Goal 1 (Week 1): Patient will maintain static sitting balance EOB for >/= 40mns with no more than MinA PT Short Term  Goal 2 (Week 1): Patient will transfer bed <> wc with LRAD and MaxA x1 PT Short Term Goal 3 (Week 1): Patient will propel himself at least 552fwith no more than MinA  Recommendations for other services: Therapeutic Recreation  Pet therapy and Stress management  Skilled Therapeutic Intervention Mobility Bed Mobility Bed Mobility: Rolling Right;Rolling Left;Supine to Sit;Sit to Supine Rolling Right: Maximal Assistance - Patient 25-49% Rolling Left: Maximal Assistance - Patient 25-49% Supine to Sit: Maximal Assistance - Patient - Patient 25-49% Sit to Supine: Maximal Assistance - Patient 25-49% Transfers Transfers: Lateral/Scoot Transfers Lateral/Scoot Transfers: 2 HePress photographerAssistive device): Other (Comment) (slideboard) Locomotion  Gait Ambulation: No Gait Gait: No Stairs / Additional Locomotion Stairs: No Wheelchair Mobility Wheelchair Mobility: Yes Wheelchair Assistance: Moderate Assistance - Patient 50 - 74% Wheelchair Propulsion: Both upper extremities Wheelchair Parts Management: Needs assistance Distance: 150   Discharge Criteria: Patient will be discharged from PT if patient refuses treatment 3 consecutive times without medical reason, if treatment goals not met, if there is a change in medical status, if patient makes no progress towards goals or if patient is discharged from hospital.  The above assessment, treatment plan, treatment alternatives and goals were discussed and mutually agreed upon: by patient  JeDebbora Dus/04/2022, 12:17 PM

## 2021-10-26 NOTE — Progress Notes (Signed)
Pt refusing CPAP

## 2021-10-26 NOTE — Progress Notes (Signed)
[facility] Individual Statement of Services  Patient Name:  ANKIT DEGREGORIO  Date:  10/26/2021  Welcome to the [facility].  Our goal is to provide you with an individualized program based on your diagnosis and situation, designed to meet your specific needs.  With this comprehensive rehabilitation program, you will be expected to participate in at least 3 hours of rehabilitation therapies Monday-Friday, with modified therapy programming on the weekends.  Your rehabilitation program will include the following services:  Physical Therapy (PT), Occupational Therapy (OT), 24 hour per day rehabilitation nursing, Therapeutic Recreaction (TR), Care Coordinator, Rehabilitation Medicine, Nutrition Services, and Pharmacy Services  Weekly team conferences will be held on Tuesday to discuss your progress.  Your Inpatient Rehabilitation Care Coordinator will talk with you frequently to get your input and to update you on team discussions.  Team conferences with you and your family in attendance may also be held.  Expected length of stay: 14-18 days  Overall anticipated outcome: cga-min level  Depending on your progress and recovery, your program may change. Your Inpatient Rehabilitation Care Coordinator will coordinate services and will keep you informed of any changes. Your Inpatient Rehabilitation Care Coordinator's name and contact numbers are listed  below.  The following services may also be recommended but are not provided by the [place] will be made to provide these services after discharge if needed.  Arrangements include referral to agencies that provide these services.  Your insurance has been verified to be:  [place] Your primary doctor is:  [name]  Pertinent information will be shared with your doctor and  your insurance company.  Inpatient Rehabilitation Care Coordinator:  Ovidio Kin, [place] or Emilia Beck  Information discussed with and copy given to patient by: Elease Hashimoto, 10/26/2021, 9:58 AM

## 2021-10-26 NOTE — Evaluation (Signed)
Occupational Therapy Assessment and Plan  Patient Details  Name: [name] [name] MRN: [id number] Date of Birth: August 09, 1956  OT Diagnosis: abnormal posture, muscular wasting and disuse atrophy, muscle weakness (generalized), and swelling of limb, L BKA Rehab Potential:   ELOS: 14-18 days   Today's Date: 10/26/2021 OT Individual Time: 4098-1191 OT Individual Time Calculation (min): 54 min     Hospital Problem: Principal Problem:   Left below-knee amputee [facility])   Past Medical History:  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[place])    Essential hypertension    History of nephrolithiasis    History of open leg wound    History of stroke July 2004   Obstructive sleep apnea    TIA (transient ischemic attack)    Varicose veins    Laser ablation of left great saphenous vein 2009   Past Surgical History:  Past Surgical History:  Procedure Laterality Date   AMPUTATION Left 10/21/2021   Procedure: LEFT BELOW KNEE AMPUTATION;  Surgeon: [name], MD;  Location: [place];  Service: Orthopedics;  Laterality: Left;   BIOPSY  10/19/2021   Procedure: BIOPSY;  Surgeon: Sharyn Creamer, MD;  Location: [place];  Service: Gastroenterology;;   COLONOSCOPY N/A 10/19/2021   Procedure: COLONOSCOPY;  Surgeon: Sharyn Creamer, MD;  Location: [place];  Service: Gastroenterology;  Laterality: N/A;   CYSTOSCOPY WITH RETROGRADE PYELOGRAM, URETEROSCOPY AND STENT PLACEMENT Right 08/02/2015   Procedure: CYSTOSCOPY WITH RETROGRADE PYELOGRAM, URETEROSCOPY , STONE EXTRACTION AND STENT PLACEMENT;  Surgeon: Cleon Gustin, MD;  Location: [facility];  Service: Urology;  Laterality: Right;   ESOPHAGOGASTRODUODENOSCOPY (EGD) WITH PROPOFOL N/A 10/19/2021   Procedure: ESOPHAGOGASTRODUODENOSCOPY (EGD) WITH PROPOFOL;  Surgeon: Sharyn Creamer, MD;  Location: [place];  Service: Gastroenterology;  Laterality: N/A;   HOLMIUM LASER APPLICATION Right [id number]/9562   Procedure: HOLMIUM LASER APPLICATION;   Surgeon: Cleon Gustin, MD;  Location: [facility];  Service: Urology;  Laterality: Right;   KIDNEY STONE SURGERY     POLYPECTOMY  10/19/2021   Procedure: POLYPECTOMY;  Surgeon: Sharyn Creamer, MD;  Location[facility];  Service: Gastroenterology;;   [place] Left 06/2010   Laser ablation    Assessment & Plan Clinical Impression: [name] [name] is a 66 year old right-handed male with history of CVA maintained on aspirin and Plavix, asbestosis, hypertension, type 2 diabetes mellitus, OSA not on CPAP, obesity with BMI 13.08, diastolic congestive heart failure, chronic venous stasis and lymphedema of bilateral lower extremities followed by wound care.  Per chart review patient lives alone.  He does have a personal care attendant as well as good support from his daughter and sister.  PCA assist with ADLs.  Patient was using a cane for short distances but mostly wheelchair-bound.  1 level home with ramped entrance.  Presented 10/14/2021 with persistent left ankle pain since he twisted his ankle getting into his car approximately 1 month ago.  He was initially referred to [facility] x-rays negative for fracture.  Patient notes extensive edema venous stasis ulceration with purulent drainage and necrotic ulcers of left lower extremity.  Admission chemistries glucose 151 BUN 71 creatinine 1.74, WBC 12,000 hemoglobin 9.4 down from recent 13.3, lactic acid 1.4 FOBT positive.  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  Septic arthritis of the tibiotalar, subtalar and talonavicular joints.  Diffuse soft tissue swelling and subcutaneous edema.  Gastroenterology services consulted for rectal bleeding FOBT positive.  Patient underwent colonoscopy/EGD 10/19/2021 per Dr. Lorenso Courier that examined portion of ileum was normal there was a single solitary ulcer at the ileocecal valve.  Seven 3 to 7 mm polyps in the sigmoid colon and descending colon removed with a  cold snare.  Nonbleeding internal hemorrhoids.  Patient was placed on PPI twice daily x8 weeks.  Patient's aspirin and Plavix initially held and has since been resumed 10/22/2021 with latest hemoglobin 7.9.  Follow-up orthopedic services Dr. 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  Noted AKI baseline serum creatinine around 1.3 presentation 1.74-1.98-2.37-2.08 and did receive IV fluids x10 hours and monitored.  Blood pressure was somewhat soft hydralazine discontinued Toprol reduced to 50 mg to allow better perfusion for kidneys.  Therapy evaluations completed and due to patient decreased functional mobility was admitted for a comprehensive rehab program. Patient transferred to [facility] on 10/25/2021 .    Patient currently requires max with basic self-care skills secondary to muscle weakness, decreased cardiorespiratoy endurance, impaired timing and sequencing, decreased coordination, and decreased motor planning, decreased motor planning, and decreased sitting balance, decreased standing balance, decreased postural control, hemiplegia, and decreased balance strategies.  Prior to hospitalization, patient could complete BADL with min.  Patient will benefit from skilled intervention to decrease level of assist with basic self-care skills and increase independence with basic self-care skills prior to discharge home with care partner. Likely moving in with significant other but TBD, Anticipate patient will require 24 hour supervision and follow up home health.  OT - End of Session Activity Tolerance: Tolerates 10 - 20 min activity with multiple rests Endurance Deficit: Yes OT Assessment OT Barriers to Discharge: Decreased caregiver support;Home environment access/layout OT  Patient demonstrates impairments in the following area(s): Balance;Edema;Motor;Endurance;Pain;Perception;Safety;Skin Integrity OT Basic ADL's Functional Problem(s): Grooming;Bathing;Dressing;Toileting OT Transfers Functional Problem(s): Toilet;Tub/Shower OT Additional Impairment(s): None OT Plan OT Intensity: Minimum of 1-2 x/day, 45 to 90 minutes OT Frequency: 5 out of 7 days OT Duration/Estimated Length of Stay: 14-18 days OT Treatment/Interventions: Balance/vestibular training;Discharge planning;Pain management;Self Care/advanced ADL retraining;Therapeutic Activities;UE/LE Coordination activities;Visual/perceptual remediation/compensation;Therapeutic Exercise;Skin care/wound managment;Patient/family education;Functional mobility training;Disease mangement/prevention;Cognitive remediation/compensation;Community reintegration;DME/adaptive equipment instruction;Neuromuscular re-education;Psychosocial support;UE/LE Strength taining/ROM;Wheelchair propulsion/positioning OT Self Feeding Anticipated Outcome(s): no goal OT Basic Self-Care Anticipated Outcome(s): Min A OT Toileting Anticipated Outcome(s): Min A OT Bathroom Transfers Anticipated Outcome(s): CGA OT Recommendation Patient destination: Home Follow Up Recommendations: Home health OT Equipment Recommended: To be determined   OT Evaluation Precautions/Restrictions  Precautions Precautions: Fall Precaution Comments: new L BKA, pressure sores on L/R bottom, 2L O2, would vac Restrictions Weight Bearing Restrictions: Yes LLE Weight Bearing: Non weight bearing Pain Pain Assessment Pain Scale: 0-10 Pain Score: 0-No pain Home Living/Prior Functioning Home Living Family/patient expects to be discharged to:: Private residence Living Arrangements: Alone Available Help at Discharge: Neighbor, Friend(s), Family, Personal care attendant, Available PRN/intermittently Type of Home: House Home Access: Ramped entrance Home Layout: One  level Bathroom Shower/Tub: Holiday representative Accessibility: No Additional Comments: sleeps in lift chair, mainly using SPC for mobility and power wc for community access; reports considering d/cing to gf's house (building a ramp), has a lift chair there to sleep in as well  Lives With: Alone Prior Function Level of Independence: Needs assistance with gait, Needs assistance with tranfers, Needs assistance with ADLs, Needs assistance with homemaking,  Requires assistive device for independence  Able to Take Stairs?: No Driving: Yes Vision Baseline Vision/History: 1 Wears glasses Ability to See in Adequate Light: 0 Adequate Patient Visual Report: No change from baseline Vision Assessment?: No apparent visual deficits Perception  Perception: Within Functional Limits Praxis Praxis: Intact Cognition Overall Cognitive Status: Within Functional Limits for tasks assessed Arousal/Alertness: Awake/alert Orientation Level: Person;Place;Situation Person: Oriented Place: Oriented Situation: Oriented Year: 2023 Month: February Day of Week: Correct Memory: Appears intact Immediate Memory Recall: Sock;Blue;Bed Memory Recall Sock: Without Cue Memory Recall Blue: Without Cue Memory Recall Bed: Without Cue Awareness: Appears intact Problem Solving: Appears intact Safety/Judgment: Appears intact Sensation Sensation Light Touch: Appears Intact Hot/Cold: Not tested Proprioception: Appears Intact Stereognosis: Appears Intact Additional Comments: phantom pain/sensation to LRL Coordination Gross Motor Movements are Fluid and Coordinated: No Fine Motor Movements are Fluid and Coordinated: No Coordination and Movement Description: limited AROM RLE due to weakness and edema, limited AROM L at hip/knee due to pain/weakness Finger Nose Finger Test: slowed and limited ROM R UE due to previous CVA Heel Shin Test: limited AROM R LE Motor  Motor Motor: Hemiplegia;Abnormal postural alignment and  control Motor - Skilled Clinical Observations: 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 in 04  Trunk/Postural Assessment  Cervical Assessment Cervical Assessment: Within Functional Limits Thoracic Assessment Thoracic Assessment: Exceptions to [facility] Lumbar Assessment Lumbar Assessment: Exceptions to [facility] Postural Control Postural Control: Deficits on evaluation Righting Reactions: delayed and inadequate Protective Responses: delayed and inadequate  Balance Balance Balance Assessed: Yes Static Sitting Balance Static Sitting - Balance Support: Bilateral upper extremity supported Static Sitting - Level of Assistance: 4: Min assist Dynamic Sitting Balance Dynamic Sitting - Balance Support: Bilateral upper extremity supported;During functional activity Dynamic Sitting - Level of Assistance: 3: Mod assist Extremity/Trunk Assessment RUE Assessment RUE Assessment: Exceptions to [facility] Active Range of Motion (AROM) Comments: roughly 3/5 for AROM, limited by old CVA approx 20 years ago LUE Assessment LUE Assessment: Within Functional Limits  Care Tool Care Tool Self Care Eating    Set up    Oral Care    Oral Care Assist Level: Supervision/Verbal cueing    Bathing   Body parts bathed by patient: Right arm;Left arm;Chest;Abdomen;Right upper leg;Left upper leg;Face Body parts bathed by helper: Front perineal area;Buttocks;Left lower leg;Right lower leg   Assist Level: Total Assistance - Patient < 25%    Upper Body Dressing(including orthotics)   What is the patient wearing?: Pull over shirt   Assist Level: Moderate Assistance - Patient 50 - 74%    Lower Body Dressing (excluding footwear)   What is the patient wearing?: Pants Assist for lower body dressing: 2 Helpers    Putting on/Taking off footwear   What is the patient wearing?: Non-skid slipper socks Assist for footwear: Dependent - Patient 0%       Care Tool Toileting Toileting activity    Total A     Care Tool Bed Mobility Roll left  and right activity Roll left and right activity did not occur: N/A Roll left and right assist level:  (sleeps in lift chair)    Sit to lying activity Sit to lying activity did not occur: N/A Sit to lying assist level:  (sleps in lift chair)    Lying to sitting on side of bed activity Lying to sitting on side of bed activity did not occur: the ability to move from lying on the back to sitting on the side of the bed with no back support.: N/A Lying to  sitting on side of bed assist level: the ability to move from lying on the back to sitting on the side of the bed with no back support.:  (sleeps in lift chair)     Care Tool Transfers Sit to stand transfer Sit to stand activity did not occur: Safety/medical concerns      Chair/bed transfer   Chair/bed transfer assist level: 2 Armed forces training and education officer transfer activity did not occur: Safety/medical concerns       Care Tool Cognition  Expression of Ideas and Wants Expression of Ideas and Wants: 4. Without difficulty (complex and basic) - expresses complex messages without difficulty and with speech that is clear and easy to understand  Understanding Verbal and Non-Verbal Content Understanding Verbal and Non-Verbal Content: 4. Understands (complex and basic) - clear comprehension without cues or repetitions   Memory/Recall Ability Memory/Recall Ability : Current season;Location of own room;Staff names and faces;That he or she is in a hospital/hospital unit   Refer to Care Plan for [place] 1 OT Short Term Goal 1 (Week 1): Pt will complete BSC/toilet transfer with Mod x2 OT Short Term Goal 2 (Week 1): Pt will perform LB dress with Max A with AE PRN OT Short Term Goal 3 (Week 1): Pt will participate in task of choice/ADL for 10 minutes without rest break  Recommendations for other services: None    Skilled Therapeutic Intervention ADL ADL Eating: Not assessed Grooming: Supervision/safety Upper  Body Bathing: Minimal assistance Lower Body Bathing: Dependent Upper Body Dressing: Moderate assistance Lower Body Dressing: Dependent Toileting: Not assessed Toilet Transfer: Not assessed Tub/Shower Transfer: Not assessed Mobility  Bed Mobility Bed Mobility: Rolling Right;Rolling Left;Supine to Sit;Sit to Supine Rolling Right: Maximal Assistance - Patient 25-49% Rolling Left: Maximal Assistance - Patient 25-49% Supine to Sit: Maximal Assistance - Patient - Patient 25-49% Sit to Supine: Maximal Assistance - Patient 25-49%   Skilled Intervention: Pt greeted at time of session bed level resting, relayed to the pt role and purpose of OT. No pain at rest, pt with scrotal edema which limited movement later in session and causing some discomfort but did not rate pain. Bed mobility performed supine <> sit with 2 assist, Mod/Max overall 2/2 body habitus. Sitting EOB for bathing and dressing tasks, deflated bed for better support and reinflated at end of session. UB bathe Min A, UB dress Min/Mod to fully get over torso. Attempted sit > stand in stedy but unable to lift buttocks at all and terminated activity. Bed level LB dressing with assist to thread wound vac and foley, 2 helpers needed. Pt bed level resting alarm on call bell in reach.     Discharge Criteria: Patient will be discharged from OT if patient refuses treatment 3 consecutive times without medical reason, if treatment goals not met, if there is a change in medical status, if patient makes no progress towards goals or if patient is discharged from hospital.  The above assessment, treatment plan, treatment alternatives and goals were discussed and mutually agreed upon: by patient  Viona Gilmore 10/26/2021, 12:57 PM

## 2021-10-26 NOTE — Plan of Care (Signed)
°  Problem: RH Balance Goal: LTG Patient will maintain dynamic sitting balance (PT) Description: LTG:  Patient will maintain dynamic sitting balance with assistance during mobility activities (PT) Flowsheets (Taken 10/26/2021 1220) LTG: Pt will maintain dynamic sitting balance during mobility activities with:: Supervision/Verbal cueing   Problem: Sit to Stand Goal: LTG:  Patient will perform sit to stand with assistance level (PT) Description: LTG:  Patient will perform sit to stand with assistance level (PT) Flowsheets (Taken 10/26/2021 1220) LTG: PT will perform sit to stand in preparation for functional mobility with assistance level: Contact Guard/Touching assist   Problem: RH Bed to Chair Transfers Goal: LTG Patient will perform bed/chair transfers w/assist (PT) Description: LTG: Patient will perform bed to chair transfers with assistance (PT). Flowsheets (Taken 10/26/2021 1220) LTG: Pt will perform Bed to Chair Transfers with assistance level: Supervision/Verbal cueing   Problem: RH Car Transfers Goal: LTG Patient will perform car transfers with assist (PT) Description: LTG: Patient will perform car transfers with assistance (PT). Flowsheets (Taken 10/26/2021 1220) LTG: Pt will perform car transfers with assist:: Contact Guard/Touching assist   Problem: RH Furniture Transfers Goal: LTG Patient will perform furniture transfers w/assist (OT/PT) Description: LTG: Patient will perform furniture transfers  with assistance (OT/PT). Flowsheets (Taken 10/26/2021 1220) LTG: Pt will perform furniture transfers with assist:: Supervision/Verbal cueing   Problem: RH Wheelchair Mobility Goal: LTG Patient will propel w/c in controlled environment (PT) Description: LTG: Patient will propel wheelchair in controlled environment, # of feet with assist (PT) Flowsheets (Taken 10/26/2021 1220) LTG: Pt will propel w/c in controlled environ  assist needed:: Supervision/Verbal cueing LTG: Propel w/c distance in  controlled environment: 150 Goal: LTG Patient will propel w/c in home environment (PT) Description: LTG: Patient will propel wheelchair in home environment, # of feet with assistance (PT). Flowsheets (Taken 10/26/2021 1220) LTG: Pt will propel w/c in home environ  assist needed:: Supervision/Verbal cueing LTG: Propel w/c distance in home environment: 150

## 2021-10-26 NOTE — Progress Notes (Signed)
Inpatient Rehabilitation Care Coordinator Assessment and Plan Patient Details  Name: [name] [name] MRN: [id number] Date of Birth: [birth year]  Today's Date: 10/26/2021  Hospital Problems: Principal Problem:   Left below-knee amputee [facility])  Past Medical History:  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[facility])    Essential hypertension    History of nephrolithiasis    History of open leg wound    History of stroke July 2004   Obstructive sleep apnea    TIA (transient ischemic attack)    Varicose veins    Laser ablation of left great saphenous vein 2009   Past Surgical History:  Past Surgical History:  Procedure Laterality Date   AMPUTATION Left 10/21/2021   Procedure: LEFT BELOW KNEE AMPUTATION;  Surgeon: [name], MD;  Location: [place];  Service: Orthopedics;  Laterality: Left;   BIOPSY  10/19/2021   Procedure: BIOPSY;  Surgeon: Sharyn Creamer, MD;  Location: [place];  Service: Gastroenterology;;   COLONOSCOPY N/A 10/19/2021   Procedure: COLONOSCOPY;  Surgeon: Sharyn Creamer, MD;  Location: [place];  Service: Gastroenterology;  Laterality: N/A;   CYSTOSCOPY WITH RETROGRADE PYELOGRAM, URETEROSCOPY AND STENT PLACEMENT Right 08/02/2015   Procedure: CYSTOSCOPY WITH RETROGRADE PYELOGRAM, URETEROSCOPY , STONE EXTRACTION AND STENT PLACEMENT;  Surgeon: Cleon Gustin, MD;  Location: [facility];  Service: Urology;  Laterality: Right;   ESOPHAGOGASTRODUODENOSCOPY (EGD) WITH PROPOFOL N/A 10/19/2021   Procedure: ESOPHAGOGASTRODUODENOSCOPY (EGD) WITH PROPOFOL;  Surgeon: Sharyn Creamer, MD;  Location: [place];  Service: Gastroenterology;  Laterality: N/A;   HOLMIUM LASER APPLICATION Right 63/78/5885   Procedure: HOLMIUM LASER APPLICATION;  Surgeon: Cleon Gustin, MD;  Location: [facility];  Service: Urology;  Laterality: Right;   KIDNEY STONE SURGERY     POLYPECTOMY  10/19/2021   Procedure: POLYPECTOMY;  Surgeon: Sharyn Creamer, MD;  Location[facility] ENDOSCOPY;  Service:  Gastroenterology;;   [place] Left 06/2010   Laser ablation   Social History:  reports that he has never smoked. He has never used smokeless tobacco. He reports that he does not drink alcohol and does not use drugs.  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 He has been independent and taken care of himself  Social History Preferred language: English Religion: Baptist Cultural Background: No issues Education: [place] - How often do you need to have someone help you when you read instructions, pamphlets, or other written material from your doctor or pharmacy?: Never Writes: Yes Employment Status: Retired Public relations account executive Issues: No issues Guardian/Conservator: None-according to MD pt is capable of making his own decisions while here   Abuse/Neglect Abuse/Neglect Assessment Can Be Completed: Yes Physical Abuse: Denies Verbal Abuse: Denies Sexual Abuse: Denies Exploitation of patient/patient's resources: Denies Self-Neglect: Denies  Patient response to: Social Isolation - How often do you feel lonely or isolated from those around you?: Never  Emotional Status Pt's affect, behavior and adjustment status: Pt is motivated to do well and become mobile again. He does want to get walking if he can with a rolling walker.  He has his wife's power chair he can use if needed. Recent Psychosocial Issues: other health issues-other leg has issues also Psychiatric History: No  history/issues Substance Abuse History: None  Patient / Family Perceptions, Expectations & Goals Pt/Family understanding of illness & functional limitations: Pt can explain his amputation and issues with his other leg. He is hopeful he will do well here and make good progress. He does talk with the MD and feels he has a good understanding of his treatment plan moving forward. Premorbid pt/family roles/activities: Boyfriend, father, retiree, home owner, etc Anticipated changes in roles/activities/participation: resume Pt/family expectations/goals: Pt states: " I hope to do well here and make a good recovery, I do have a power chair if needed."  [employer]: None Premorbid Home Care/DME Agencies: Other (Comment) (has wife's power chair, lift chair he sleeps in, rw too small for him and ramp into his home) Transportation available at discharge: Pt drove and [name] [name] drives Is the patient able to respond to transportation needs?: Yes In the past 12 months, has lack of transportation kept you from medical appointments or from getting medications?: No In the past 12 months, has lack of transportation kept you from meetings, work, or from getting things needed for daily living?: No  Discharge Planning Living Arrangements: Alone Support Systems: Spouse/significant other, Children, Friends/neighbors Type of Residence: Private residence Insurance Resources: [employer], Multimedia programmer (specify) Sports administrator) Financial Resources: Radio broadcast assistant Screen Referred: No Living Expenses: Own Money Management: Patient Does the patient have any problems obtaining your medications?: No Home Management: Has an aide who helps with home management Patient/Family Preliminary Plans: Plans on going to [place] home at discharge but needs to confirm this with her. 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 Aware he is being evaluated by therapies and setting goals. Care  Coordinator Barriers to Discharge: Lack of/limited family support, Wound Care Care Coordinator Anticipated Follow Up Needs: HH/OP, Support Group  Clinical Impression Pleasant gentleman who is motivated to do well and make good progress here. His girlfriend-[name] [name] is involved and willing to assist. May be going to her home at discharge will confirm with her. Await therapy evaluations and work on discharge needs. Concerned regarding his other leg and the wounds on it.  Elease Hashimoto 10/26/2021, 9:53 AM

## 2021-10-26 NOTE — Progress Notes (Signed)
PROGRESS NOTE   Subjective/Complaints:    Objective:   US RENAL  Result Date: 10/24/2021 CLINICAL DATA:  Renal dysfunction EXAM: RENAL / URINARY TRACT ULTRASOUND COMPLETE COMPARISON:  03/31/2016 FINDINGS: Right Kidney: Renal measurements: 12.3 x 6.8 x 7 cm = volume: 303.38 mL. There is increased echogenicity. There is no hydronephrosis. There is 1.3 cm cyst in the midportion. Left Kidney: Renal measurements: 15.4 by 8.2 x 9 cm = volume: 594.7 mL. There is no hydronephrosis. There is increased cortical echogenicity. There is 1.5 cm cyst in the midportion. Bladder: Ureteral jets are observed at both ureterovesical junctions. Other: According to the note by the technologist, examination was technically difficult due to patient's body habitus. IMPRESSION: There is no hydronephrosis. Increased cortical echogenicity suggests medical renal disease. Bilateral renal cysts. Electronically Signed   By: Elmer Picker M.D.   On: 10/24/2021 19:31   Recent Labs    10/24/21 1055 10/25/21 1303  WBC 10.0 8.6  HGB 7.6* 7.6*  HCT 25.1* 25.1*  PLT 242 229   Recent Labs    10/25/21 0834 10/25/21 1303  NA 137 138  K 4.6 4.8  CL 105 107  CO2 24 23  GLUCOSE 142* 141*  BUN 39* 37*  CREATININE 2.08* 2.06*  CALCIUM 7.7* 7.8*    Intake/Output Summary (Last 24 hours) at 10/26/2021 0839 Last data filed at 10/26/2021 0700 Gross per 24 hour  Intake 600 ml  Output 1450 ml  Net -850 ml     Pressure Injury 10/15/21 Buttocks Right;Left Stage 2 -  Partial thickness loss of dermis presenting as a shallow open injury with a red, pink wound bed without slough. patchy areas of partial thickness skin loss related to moiture associated skin damage (Active)  10/15/21 1635  Location: Buttocks  Location Orientation: Right;Left  Staging: Stage 2 -  Partial thickness loss of dermis presenting as a shallow open injury with a red, pink wound bed without slough.   Wound Description (Comments): patchy areas of partial thickness skin loss related to moiture associated skin damage  Present on Admission: Yes    Physical Exam: Vital Signs Blood pressure 137/71, pulse 72, temperature 98.7 F (37.1 C), temperature source Oral, resp. rate 18, SpO2 96 %.  General: Alert and oriented x 3, No apparent distress. obese HEENT: Head is normocephalic, atraumatic, PERRLA, EOMI, sclera anicteric, oral mucosa pink and moist, dentition poor, ext ear canals clear,  Neck: Supple without JVD or lymphadenopathy Heart: Reg rate and rhythm. No murmurs rubs or gallops Chest: CTA bilaterally without wheezes, rales, or rhonchi; no distress Abdomen: Soft, non-tender,  distended, bowel sounds positive. Extremities: RLE edema, (pt says it's better compared to his baseline). Arms with trace edema.  Psych: Pt's affect is appropriate. Pt is cooperative GU: scrotal edema remains Skin:  RLE venous stasis wound, chronic skin changes. Crusty right foot near prior amp sites Left BK in vac, with ongoing s/s drainage, MASD buttocks  Neuro:  Alert and oriented x 3. Normal insight and awareness. Intact Memory. Normal language and speech. Cranial nerve exam unremarkable. Decreased sensation distal RLE. Has 4- /5 strength UE. RLE 3/5 prox to4/5 distally. No abnl tone Musculoskeletal: left stump in  vac, swollen, somewhat tender    Assessment/Plan: 1. Functional deficits which require 3+ hours per day of interdisciplinary therapy in a comprehensive inpatient rehab setting. Physiatrist is providing close team supervision and 24 hour management of active medical problems listed below. Physiatrist and rehab team continue to assess barriers to discharge/monitor patient progress toward functional and medical goals  Care Tool:  Bathing              Bathing assist       Upper Body Dressing/Undressing Upper body dressing   What is the patient wearing?: Hospital gown only    Upper body  assist Assist Level: Total Assistance - Patient < 25%    Lower Body Dressing/Undressing Lower body dressing            Lower body assist       Toileting Toileting    Toileting assist       Transfers Chair/bed transfer  Transfers assist           Locomotion Ambulation   Ambulation assist              Walk 10 feet activity   Assist           Walk 50 feet activity   Assist           Walk 150 feet activity   Assist           Walk 10 feet on uneven surface  activity   Assist           Wheelchair     Assist               Wheelchair 50 feet with 2 turns activity    Assist            Wheelchair 150 feet activity     Assist          Blood pressure 137/71, pulse 72, temperature 98.7 F (37.1 C), temperature source Oral, resp. rate 18, SpO2 96 %.  Medical Problem List and Plan: 1. 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  Antithrombotics: -DVT/anticoagulation:  Mechanical: Antiembolism stockings, thigh (TED hose) Right lower extremity             -antiplatelet therapy: Aspirin 81 mg daily and Plavix 75 mg daily    3. Pain Management: Hydrocodone as needed -add gabapentin for phantom limb pain, 100mg  bid 4. Mood: Provide emotional support             -antipsychotic agents: N/A 5. Neuropsych: This patient is capable of making decisions on his own behalf. 6. Skin/Wound Care: Routine skin checks.  MASD on buttocks and lymphedema/venous stasis ulcers on RLE- was told Pressure ulcer on buttocks, but not per WOC or nursing.   WOC follow-up wound care as directed.  -still a lot of drainage through vac, will check with ortho b/f removing 7. Fluids/Electrolytes/Nutrition: Routine in and  outs with follow-up chemistries 8.  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  Status post EGD and colonoscopy 10/19/2021.  Continue PPI x8 weeks. 9.  Acute blood loss anemia.  Follow-up CBC  as available 10.  History of CVA.  Aspirin Plavix as prior to admission. 11.  AKI.  Baseline creatinine 1.3.   IV fluids until follow up labs AM.  Follow-up chemistries pending this morning 12.  Hypertension.  Blood pressure somewhat soft with hydralazine discontinued Toprol decreased to 25 mg daily to help perfusion for kidneys.  -continue current toprol for now 13.  Diabetes mellitus.  Hemoglobin A1c 7.3.  Semglee initiated while in the hospital 10 units daily.     CBG (last 3)  Recent Labs    10/25/21 1705 10/25/21 2054 10/26/21 0550  GLUCAP 134* 159* 130*    2/8 reasonable control at present 14.  Morbid obesity.  BMI 50.22.  Dietary follow-up 15.  Hyperlipidemia.  Lipitor 16.  History of gout.  Zyloprim 300 mg daily.  Monitor for any gout flareups 17.  Acute on chronic diastolic congestive heart failure.  Monitor for any signs of fluid overload especially with IVF on board 18. R hand swelling- suggest K tape for R hand 19. Scrotal swelling- severe- suggested to nurse to elevate on towel- might need scrotal sling     LOS: 1 days A FACE TO FACE EVALUATION WAS PERFORMED  Meredith Staggers 10/26/2021, 8:39 AM

## 2021-10-27 LAB — GLUCOSE, CAPILLARY
Glucose-Capillary: 127 mg/dL — ABNORMAL HIGH (ref 70–99)
Glucose-Capillary: 113 mg/dL — ABNORMAL HIGH (ref 70–99)
Glucose-Capillary: 124 mg/dL — ABNORMAL HIGH (ref 70–99)
Glucose-Capillary: 141 mg/dL — ABNORMAL HIGH (ref 70–99)

## 2021-10-27 NOTE — Progress Notes (Signed)
Physical Therapy Session Note  Patient Details  Name: [name] [name] MRN: [id number] Date of Birth: Feb 20, 1956  Today's Date: 10/27/2021 PT Individual Time: 1012-1058 PT Individual Time Calculation (min): 46 min   Short Term Goals: Week 1:  PT Short Term Goal 1 (Week 1): Patient will maintain static sitting balance EOB for >/= 31mins with no more than MinA PT Short Term Goal 2 (Week 1): Patient will transfer bed <> wc with LRAD and MaxA x1 PT Short Term Goal 3 (Week 1): Patient will propel himself at least 86ft with no more than MinA  Skilled Therapeutic Interventions/Progress Updates:    pt received in bed and agreeable to therapy. No complaint of pain. Donned pants with max A, required assist to roll d/t size of bed. Therapist created sling to support scrotal edema during mobility. Pt then required max A x 2 to sit EOB. Pt required +3 assist to slide board transfer to W/c, with incr time d/t body  habitus. Attempted to push w/c, but had difficulty d/t equipment set up. Pt transported to day room and performed kinetron 3 x 1 min at 30 cm/sec for posterior chain strengthening. Pt returned to room and remained in w/c, was left with all needs in reach and alarm active.   Therapy Documentation Precautions:  Precautions Precautions: Fall Precaution Comments: new L BKA, pressure sores on L/R bottom, 2L O2, would vac Restrictions Weight Bearing Restrictions: Yes LLE Weight Bearing: Non weight bearing    Therapy/Group: Individual Therapy  Mickel Fuchs 10/27/2021, 12:58 PM

## 2021-10-27 NOTE — Progress Notes (Addendum)
PROGRESS NOTE   Subjective/Complaints: Pt had a pretty good night. Did not use cpap. Left leg still sore, phantom symptoms. Had shooting pains in leg yesterday during therapy transfers  ROS: Patient denies fever, rash, sore throat, blurred vision, dizziness, nausea, vomiting, diarrhea, cough, shortness of breath or chest pain,   headache, or mood change.    Objective:   No results found. Recent Labs    10/24/21 1055 10/25/21 1303  WBC 10.0 8.6  HGB 7.6* 7.6*  HCT 25.1* 25.1*  PLT 242 229   Recent Labs    10/25/21 1303 10/26/21 1002  NA 138 137  K 4.8 4.7  CL 107 105  CO2 23 24  GLUCOSE 141* 135*  BUN 37* 32*  CREATININE 2.06* 1.74*  CALCIUM 7.8* 7.9*    Intake/Output Summary (Last 24 hours) at 10/27/2021 0947 Last data filed at 10/27/2021 0840 Gross per 24 hour  Intake 480 ml  Output 1865 ml  Net -1385 ml     Pressure Injury 10/15/21 Buttocks Right;Left Stage 2 -  Partial thickness loss of dermis presenting as a shallow open injury with a red, pink wound bed without slough. patchy areas of partial thickness skin loss related to moiture associated skin damage (Active)  10/15/21 1635  Location: Buttocks  Location Orientation: Right;Left  Staging: Stage 2 -  Partial thickness loss of dermis presenting as a shallow open injury with a red, pink wound bed without slough.  Wound Description (Comments): patchy areas of partial thickness skin loss related to moiture associated skin damage  Present on Admission: Yes    Physical Exam: Vital Signs Blood pressure (!) 155/78, pulse 74, temperature 98.3 F (36.8 C), temperature source Oral, resp. rate 20, weight (!) 161.5 kg, SpO2 95 %.  Constitutional: No distress . Vital signs reviewed. obese HEENT: NCAT, EOMI, oral membranes moist Neck: supple Cardiovascular: RRR without murmur. No JVD    Respiratory/Chest: CTA Bilaterally without wheezes or rales. Normal effort     GI/Abdomen: BS +, non-tender, non-distended Ext: no clubbing, cyanosis, RLE 2+ edema Psych: pleasant and cooperative  GU: scrotal edema   Skin:  RLE venous stasis wound, chronic skin changes. Crusty right foot near prior amp sites Left BK in vac, with ongoing s/s drainage, MASD buttocks  Neuro:  Alert and oriented x 3. Normal insight and awareness. Intact Memory. Normal language and speech. Cranial nerve exam unremarkable. Decreased sensation distal RLE. Has 4- /5 strength UE. RLE 3/5 prox to4/5 distally. No abnl tone Musculoskeletal: left stump in vac, swollen, somewhat tender    Assessment/Plan: 1. Functional deficits which require 3+ hours per day of interdisciplinary therapy in a comprehensive inpatient rehab setting. Physiatrist is providing close team supervision and 24 hour management of active medical problems listed below. Physiatrist and rehab team continue to assess barriers to discharge/monitor patient progress toward functional and medical goals  Care Tool:  Bathing    Body parts bathed by patient: Right arm, Left arm, Chest, Abdomen, Right upper leg, Left upper leg, Face   Body parts bathed by helper: Front perineal area, Buttocks, Left lower leg, Right lower leg     Bathing assist Assist Level: Total Assistance - Patient <  25%     Upper Body Dressing/Undressing Upper body dressing   What is the patient wearing?: Pull over shirt    Upper body assist Assist Level: Moderate Assistance - Patient 50 - 74%    Lower Body Dressing/Undressing Lower body dressing      What is the patient wearing?: Pants     Lower body assist Assist for lower body dressing: 2 Helpers     Toileting Toileting    Toileting assist Assist for toileting: Dependent - Patient 0%     Transfers Chair/bed transfer  Transfers assist     Chair/bed transfer assist level: 2 Helpers     Locomotion Ambulation   Ambulation assist   Ambulation activity did not occur: Safety/medical  concerns          Walk 10 feet activity   Assist  Walk 10 feet activity did not occur: Safety/medical concerns        Walk 50 feet activity   Assist Walk 50 feet with 2 turns activity did not occur: Safety/medical concerns         Walk 150 feet activity   Assist Walk 150 feet activity did not occur: Safety/medical concerns         Walk 10 feet on uneven surface  activity   Assist Walk 10 feet on uneven surfaces activity did not occur: Safety/medical concerns         Wheelchair     Assist Is the patient using a wheelchair?: Yes Type of Wheelchair: Manual    Wheelchair assist level: Moderate Assistance - Patient 50 - 74% Max wheelchair distance: 150    Wheelchair 50 feet with 2 turns activity    Assist        Assist Level: Moderate Assistance - Patient 50 - 74%   Wheelchair 150 feet activity     Assist      Assist Level: Moderate Assistance - Patient 50 - 74%   Blood pressure (!) 155/78, pulse 74, temperature 98.3 F (36.8 C), temperature source Oral, resp. rate 20, weight (!) 161.5 kg, SpO2 95 %.  Medical Problem List and Plan: 1. 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  Antithrombotics: -DVT/anticoagulation:  Mechanical: Antiembolism stockings, thigh (TED hose) Right lower extremity             -antiplatelet therapy: Aspirin 81 mg daily and Plavix 75 mg daily    3. Pain Management: Hydrocodone as needed -continue gabapentin for phantom limb pain, 100mg  bid 4. Mood: Provide emotional support             -antipsychotic agents: N/A 5. Neuropsych: This patient is capable of making decisions on his own behalf. 6. Skin/Wound Care: Routine skin checks.  MASD on buttocks and lymphedema/venous stasis ulcers on RLE- was told Pressure  ulcer on buttocks, but not per WOC or nursing.   WOC follow-up wound care as directed.  -continued drainage through vac, will check with ortho b/f removing  -i'm unsure of volume currently--checking with nurse 7. Fluids/Electrolytes/Nutrition: Routine in and outs with follow-up chemistries 8.  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  Status post EGD and colonoscopy  10/19/2021.  Continue PPI x8 weeks. 9.  Acute blood loss anemia.  Follow-up CBC as available 10.  History of CVA.  Aspirin Plavix as prior to admission. 11.  AKI.  Baseline creatinine 1.3.    -BUN/Cr better yesterday, now off IVF, push fluids -recheck bmet Friday 12.  Hypertension.  Blood pressure somewhat soft with hydralazine discontinued Toprol decreased to 25 mg daily to help perfusion for kidneys.  -continue current toprol for now. Bp controlled 13.  Diabetes mellitus.  Hemoglobin A1c 7.3.  Semglee initiated while in the hospital 10 units daily.     CBG (last 3)  Recent Labs    10/26/21 1704 10/26/21 2127 10/27/21 0625  GLUCAP 169* 110* 127*    2/9 reasonable control at present 14.  Morbid obesity.  BMI 50.22.  Dietary follow-up 15.  Hyperlipidemia.  Lipitor 16.  History of gout.  Zyloprim 300 mg daily.  Monitor for any gout flareups 17.  Acute on chronic diastolic congestive heart failure.   Filed Weights   10/27/21 0434  Weight: (!) 161.5 kg  -need daily weights 18. R hand swelling- suggest K tape for R hand, elevation 19. Scrotal swelling- elevation/ towel    LOS: 2 days A FACE TO FACE EVALUATION WAS PERFORMED  Meredith Staggers 10/27/2021, 9:47 AM

## 2021-10-27 NOTE — Progress Notes (Signed)
Physical Therapy Session Note  Patient Details  Name: [name] [name] MRN: [id number] Date of Birth: 1956-05-04  Today's Date: 10/27/2021 PT Individual Time: 1420-1530 PT Individual Time Calculation (min): 70 min   Short Term Goals: Week 1:  PT Short Term Goal 1 (Week 1): Patient will maintain static sitting balance EOB for >/= 21mins with no more than MinA PT Short Term Goal 2 (Week 1): Patient will transfer bed <> wc with LRAD and MaxA x1 PT Short Term Goal 3 (Week 1): Patient will propel himself at least 61ft with no more than MinA  Skilled Therapeutic Interventions/Progress Updates:  Pt received seated in WC in room, hips almost sliding out of chair and pt very slouched. Pt reported 8/10 pain in head and RUE, was premedicated. Offered repositioning and distraction throughout session for pain modulation. Pt's speech very slurred throughout session and difficult to understand at times, family present to interpret intermittently. Pt verbalized discomfort in current WC, but no other WC available on unit. Contacted [facility], but no WC available their either. Obtained Roho hybrid cushion to try to alleviate pressure from sacrum until another WC can be obtained. Pt unable to adequately perform pressure relief in current WC due to body habitus. Emphasis of session on sit <>stands and transfers. Pt transported to main gym w/total A for time management and attempted 2 sit <>stands in //bars w/3 person assist. Pt unable to position R foot on ground properly or clear hips from [facility] w/BUE support on rail and +3 assist to achieve stand. Pt reported significant fatigue from earlier sessions and requested to return to bed. Pt transported back to room w/total A and performed chair <>bed transfer via maxi move and +3 assistance for wound vac and catheter management. Pt's R foot very painful and swollen, resulting in difficult transfer in Maxi due to difficulty clearing R foot around maxi to turn to lower in bed. Once in  bed, +2 assistance to boost pt up and reposition him w/pillows. Pt was left supine in bed, all needs in reach, pt's children present.   Therapy Documentation Precautions:  Precautions Precautions: Fall Precaution Comments: new L BKA, pressure sores on L/R bottom, 2L O2, would vac Restrictions Weight Bearing Restrictions: Yes LLE Weight Bearing: Non weight bearing   Therapy/Group: Individual Therapy Cruzita Lederer Ryan Ogborn, PT, DPT  10/27/2021, 7:58 AM

## 2021-10-27 NOTE — Progress Notes (Signed)
Pt still refusing to use CPAP

## 2021-10-27 NOTE — Progress Notes (Signed)
Occupational Therapy Session Note  Patient Details  Name: [name] [name] MRN: [id number] Date of Birth: 1955/10/16  Today's Date: 10/27/2021 OT Individual Time: 4718-5501 OT Individual Time Calculation (min): 72 min    Short Term Goals: Week 1:  OT Short Term Goal 1 (Week 1): Pt will complete BSC/toilet transfer with Mod x2 OT Short Term Goal 2 (Week 1): Pt will perform LB dress with Max A with AE PRN OT Short Term Goal 3 (Week 1): Pt will participate in task of choice/ADL for 10 minutes without rest break  Skilled Therapeutic Interventions/Progress Updates:    Pt resting in bed upon arrival. Pt declined bathing/dressing this morning. Max A for repositioning in bed. OT intervention with focus on bed mobility and BUE therex. Kinesio Tape applied to Rt hand for edema mgmt. RN notified that arm band on Rt wrist is extremely tight. RN removed arm bands. Pt completed therex with level 3 theraband per HEP in room: 3X10 shoulder flexion  3X10 bicep curls 3X10 shoulder horizontal ABD 3X10 shoulder diagonal pulls 3X10 shoulder extension  3X10 alternating punches 3X10 bilateral shoulder external rotation   Pt remained in bed with all needs within reach.  Therapy Documentation Precautions:  Precautions Precautions: Fall Precaution Comments: new L BKA, pressure sores on L/R bottom, 2L O2, would vac Restrictions Weight Bearing Restrictions: Yes LLE Weight Bearing: Non weight bearing  Pain: Pt c/o 4/10 pain in LLE; premedicated  Therapy/Group: Individual Therapy  Leroy Libman 10/27/2021, 9:29 AM

## 2021-10-28 ENCOUNTER — Encounter: Payer: Self-pay | Admitting: Internal Medicine

## 2021-10-28 LAB — BASIC METABOLIC PANEL
Anion gap: 8 (ref 5–15)
BUN: 32 mg/dL — ABNORMAL HIGH (ref 8–23)
CO2: 25 mmol/L (ref 22–32)
Calcium: 8.3 mg/dL — ABNORMAL LOW (ref 8.9–10.3)
Chloride: 105 mmol/L (ref 98–111)
Creatinine, Ser: 1.48 mg/dL — ABNORMAL HIGH (ref 0.61–1.24)
GFR, Estimated: 52 mL/min — ABNORMAL LOW (ref >60)
Glucose, Bld: 139 mg/dL — ABNORMAL HIGH (ref 70–99)
Potassium: 4.7 mmol/L (ref 3.5–5.1)
Sodium: 138 mmol/L (ref 135–145)

## 2021-10-28 LAB — GLUCOSE, CAPILLARY
Glucose-Capillary: 147 mg/dL — ABNORMAL HIGH (ref 70–99)
Glucose-Capillary: 132 mg/dL — ABNORMAL HIGH (ref 70–99)
Glucose-Capillary: 138 mg/dL — ABNORMAL HIGH (ref 70–99)
Glucose-Capillary: 143 mg/dL — ABNORMAL HIGH (ref 70–99)
Glucose-Capillary: 161 mg/dL — ABNORMAL HIGH (ref 70–99)

## 2021-10-28 MED ORDER — FERROUS SULFATE 325 (65 FE) MG PO TABS
325.0000 mg | ORAL_TABLET | Freq: Every day | ORAL | Status: DC
  Administered 2021-10-28 – 2021-11-17 (×20): 325 mg via ORAL
  Filled 2021-10-28 (×21): qty 1

## 2021-10-28 NOTE — IPOC Note (Signed)
Overall Plan of Care [facility] Patient Details Name: ELCHANAN BOB MRN: [id number] DOB: 27-Dec-1955  Admitting Diagnosis: Left below-knee amputee [facility])  Hospital Problems: Principal Problem:   Left below-knee amputee [facility])     Functional Problem List: Nursing Bladder, Bowel, Medication Management, Safety, Endurance, Skin Integrity, Pain, Edema  PT Balance, Behavior, Edema, Endurance, Motor, Nutrition, Pain, Perception, Safety, Sensory, Skin Integrity  OT Balance, Edema, Motor, Endurance, Pain, Perception, Safety, Skin Integrity  SLP    TR         Basic ADLs: OT Grooming, Bathing, Dressing, Toileting     Advanced  ADLs: OT       Transfers: PT Bed Mobility, Bed to Chair, Car, Manufacturing systems engineer, Metallurgist: PT Emergency planning/management officer, Ambulation     Additional Impairments: OT None  SLP        TR      Anticipated Outcomes Item Anticipated Outcome  Self Feeding no goal  Swallowing      Basic self-care  Min A  Toileting  Min A   Bathroom Transfers CGA  Bowel/Bladder  manage bowel w mod I and bladder with min assist  Transfers  spv  Locomotion  n/a  Communication     Cognition     Pain  at or below level 4 with prns  Safety/Judgment  maintain safety w cues   Therapy Plan: PT Intensity: Minimum of 1-2 x/day ,45 to 90 minutes PT Frequency: 5 out of 7 days PT Duration Estimated Length of Stay: 12-14 days OT Intensity: Minimum of 1-2 x/day, 45 to 90 minutes OT Frequency: 5 out of 7 days OT Duration/Estimated Length of Stay: 14-18 days     Due to the current state of emergency, patients may not be receiving their 3-hours of Medicare-mandated therapy.   Team Interventions: Nursing Interventions Bladder Management, Disease Management/Prevention, Medication Management, Discharge Planning, Pain Management, Skin Care/Wound Management, Bowel Management, Patient/Family Education  PT interventions Ambulation/gait training, Discharge planning,  Functional mobility training, Psychosocial support, Therapeutic Activities, Visual/perceptual remediation/compensation, Balance/vestibular training, Disease management/prevention, Neuromuscular re-education, Skin care/wound management, Therapeutic Exercise, Wheelchair propulsion/positioning, Cognitive remediation/compensation, DME/adaptive equipment instruction, Pain management, Splinting/orthotics, UE/LE Strength taining/ROM, Community reintegration, Technical sales engineer stimulation, Patient/family education, IT trainer, UE/LE Coordination activities  OT Interventions Training and development officer, Discharge planning, Pain management, Self Care/advanced ADL retraining, Therapeutic Activities, UE/LE Coordination activities, Visual/perceptual remediation/compensation, Therapeutic Exercise, Skin care/wound managment, Patient/family education, Functional mobility training, Disease mangement/prevention, Cognitive remediation/compensation, Academic librarian, Engineer, drilling, Neuromuscular re-education, Psychosocial support, UE/LE Strength taining/ROM, Wheelchair propulsion/positioning  SLP Interventions    TR Interventions    SW/CM Interventions Discharge Planning, Psychosocial Support, Patient/Family Education   Barriers to Discharge MD  Medical stability  Nursing Decreased caregiver support, Wound Care, Weight 1 level ramped entry w girlfriend; has BSC, power w/c and RW with left chair  PT Decreased caregiver support, Home environment access/layout, Wound Care, Weight, Weight bearing restrictions, New oxygen    OT Decreased caregiver support, Home environment access/layout    SLP      SW Lack of/limited family support, Wound Care     Team Discharge Planning: Destination: PT-Home ,OT- Home , SLP-  Projected Follow-up: PT-Home health PT, 24 hour supervision/assistance, OT-  Home health OT, SLP-  Projected Equipment Needs: PT-To be determined, OT- To be determined, SLP-   Equipment Details: PT- , OT-  Patient/family involved in discharge planning: PT- Patient,  OT-Patient, SLP-   MD ELOS: 13-15 days Medical Rehab Prognosis:  Excellent Assessment: The patient has  been admitted for CIR therapies with the diagnosis of left BKA, morbid obesity. The team will be addressing functional mobility, strength, stamina, balance, safety, adaptive techniques and equipment, self-care, bowel and bladder mgt, patient and caregiver education, Pre-prosthetic education, pain control, wound/vac mgt, ego support, community reentry. Goals have been set at min to cga for self-care and min assist for transfers and mod I with w/c mobility.   Due to the current state of emergency, patients may not be receiving their 3 hours per day of Medicare-mandated therapy.    Meredith Staggers, MD, FAAPMR     See Team Conference Notes for weekly updates to the plan of care

## 2021-10-28 NOTE — Progress Notes (Signed)
Physical Therapy Session Note  Patient Details  Name: [name] [name] MRN: [id number] Date of Birth: 12-Jul-1956  Today's Date: 10/28/2021 PT Individual Time: 1050-1200 PT Individual Time Calculation (min): 70 min   Short Term Goals: Week 1:  PT Short Term Goal 1 (Week 1): Patient will maintain static sitting balance EOB for >/= 92mns with no more than MinA PT Short Term Goal 2 (Week 1): Patient will transfer bed <> wc with LRAD and MaxA x1 PT Short Term Goal 3 (Week 1): Patient will propel himself at least 563fwith no more than MinA   Skilled Therapeutic Interventions/Progress Updates:   Pt received supine in bed and agreeable to PT. Supine>sit transfer with MIN ASSIST on the L side of bed with heavy use of bed rails and increased time.   Sitting balance. Lateral scooting on EOB with mod assist. Noted to use reciprocal pelvic rotation over lateral translation due to poor tolerance to WB through the RLE. AAROM to improve chest opening, and activation of parascapular musculature 3 x 1 min . Sitting BLE therex: LAQ, hip flexion, hip adduction 2 x 10.  Sit>supine with min assist with bed deflated and assist to the RLE.   Supine therex: all completed 2 x 12 with AAROM on the RLE. SLR, SAQ, hip abduction, ankle PF, ankle DF on the RLE, quad sets, glute sets. Cues for improved hold at end range as well as increased ROM for RLE>LLE.    Pt left supine in bed with call bell in reach and all needs met.       Therapy Documentation Precautions:  Precautions Precautions: Fall Precaution Comments: new L BKA, pressure sores on L/R bottom, 2L O2, would vac Restrictions Weight Bearing Restrictions: Yes LLE Weight Bearing: Non weight bearing    Vital Signs: Therapy Vitals Temp: 99.2 F (37.3 C) Temp Source: Oral Pulse Rate: 72 Resp: 14 BP: (!) 152/77 Patient Position (if appropriate): Lying Oxygen Therapy SpO2: 95 % Pain: Pain Assessment Pain Scale: 0-10 Pain Score: 0-No  pain     Therapy/Group: Individual Therapy  AuLorie Phenix/06/2022, 11:35 PM

## 2021-10-28 NOTE — Progress Notes (Signed)
Occupational Therapy Session Note  Patient Details  Name: [name] [name] MRN: [id number] Date of Birth: Feb 24, 1956  Today's Date: 10/28/2021 OT Individual Time: 1420-1530 OT Individual Time Calculation (min): 70 min    Short Term Goals: Week 1:  OT Short Term Goal 1 (Week 1): Pt will complete BSC/toilet transfer with Mod x2 OT Short Term Goal 2 (Week 1): Pt will perform LB dress with Max A with AE PRN OT Short Term Goal 3 (Week 1): Pt will participate in task of choice/ADL for 10 minutes without rest break  Skilled Therapeutic Interventions/Progress Updates:  Skilled OT intervention completed with focus on bed mobility, repositioning for pressure relief, RUE edema management, residual limb education. Pt received upright in bed, with friend present and pt agreeable to session. Pt was able to assist in advancing RLE towards EOB with min A, and 2nd person behind pt for trunk support at min A. Pt was able to use bed rails for assist with scooting EOB, with therapist turning off air mattress to prevent pt bottoming out as pt has decreased sitting balance when UE unsupported due to design of air bed. Pt able to tolerate sitting EOB for short time, however very limited due to pressure of bed rail under pt's legs. Pt requested to return to bed, with CGA required for guiding trunk to [facility] with mod A of 2nd person needed for lifting RLE into bed. Educated pt on importance of repositioning, at least to different sides every 30 or so mins, with therapist propping several pillows under R side to to off load for bed level tasks. When pt was received, his R hand was down and under covers, with significant distal swelling in digits/palm/wrist/forearm, so therapist completed retrograde massage with pt's RUE in elevated position, with pt completing composite flexion/extension of digits and x5 chest press throughout. Improvement in swelling achieved, however still with mod swelling present. Issued pt a red foam block  for edema management in between therapies, and educated pt on importance of elevating RUE with pillows and distal end being higher than elbow. Pt's son present, with therapist providing residual limb care education including contracture prevention, swelling management and disease etiology, with therapist issuing pt an amputation booklet for further self-education. Pt was left in side-lying with RUE propped on pillows, pt semi-reclined, bed alarm on, all needs in reach and son present at end of session.   Therapy Documentation Precautions:  Precautions Precautions: Fall Precaution Comments: new L BKA, pressure sores on L/R bottom, 2L O2, would vac Restrictions Weight Bearing Restrictions: Yes LLE Weight Bearing: Non weight bearing  Pain: No c/o pain   Therapy/Group: Individual Therapy  [name] [name] 10/28/2021, 7:54 AM

## 2021-10-28 NOTE — Progress Notes (Signed)
Occupational Therapy Session Note  Patient Details  Name: [name] [name] MRN: [id number] Date of Birth: 05-15-1956  Today's Date: 10/28/2021 OT Individual Time: 4401-0272 OT Individual Time Calculation (min): 55 min    Short Term Goals: Week 1:  OT Short Term Goal 1 (Week 1): Pt will complete BSC/toilet transfer with Mod x2 OT Short Term Goal 2 (Week 1): Pt will perform LB dress with Max A with AE PRN OT Short Term Goal 3 (Week 1): Pt will participate in task of choice/ADL for 10 minutes without rest break  Skilled Therapeutic Interventions/Progress Updates:    Pt received in bed and agreeable to working on doffing old shorts and donning new ones.  Pt had been sitting on a bed pan that had not been removed. Pt worked on rolling to move bed pan, but no void even though pt thought he had.  Pt stated he has great difficulty moving RLE due to old CVA. Encouraged him to put effort into using his leg and that he can gain strength in it with effort so that he can move in transfers now that he does not have a L limb.  Pt had a handmade scrotal sling on but he reported that his scrotum has always been this size.  At home he uses boxers to support himself.  Suggested we don a hospital brief and pt agreed.   Pt worked on rolling in bed with min A to occasionally bring R leg fully over to the side.  Pt often had to scoot himself in bed to one side of bed in order to have enough room to roll fully onto his side.  The condom cath had come off so nursing arrived to replace it.  Pt worked on actively lifting and bending R knee to assist this therapist with donning clothing over leg. He then worked on rolling and getting pants over hips about 75% of the way.  He needed A to fully pull over his backside.  Pt then used arms actively to help scoot himself up in bed so bed could be lifted more upright.  Once upright, pt engaged in UB bathing with set up and UB dressing with mod A. He used bed rails to pull  himself into trunk flexion so I could get his shirt down his back.  Pt has a very large abdomen so his body habitus prevents forward flexion at the hips fully.    Pt resting in bed until next therapy session.  All needs met.   Therapy Documentation Precautions:  Precautions Precautions: Fall Precaution Comments: new L BKA, pressure sores on L/R bottom, 2L O2, would vac Restrictions Weight Bearing Restrictions: Yes LLE Weight Bearing: Non weight bearing  Pain: no c/o pain   ADL: ADL Eating: Not assessed Grooming: Supervision/safety Upper Body Bathing: Minimal assistance Lower Body Bathing: Dependent Upper Body Dressing: Moderate assistance Lower Body Dressing: Dependent Toileting: Not assessed Toilet Transfer: Not assessed Tub/Shower Transfer: Not assessed   Therapy/Group: Individual Therapy  [place] 10/28/2021, 8:24 AM

## 2021-10-28 NOTE — Progress Notes (Signed)
PROGRESS NOTE   Subjective/Complaints: Still not using cpap. Reported cramping in his right arm after working with theraband yesterday. Left leg still sore, phantom pains a little better   ROS: Patient denies fever, rash, sore throat, blurred vision, dizziness, nausea, vomiting, diarrhea, cough, shortness of breath or chest pain, j headache, or mood change.    Objective:   No results found. Recent Labs    10/25/21 1303  WBC 8.6  HGB 7.6*  HCT 25.1*  PLT 229   Recent Labs    10/26/21 1002 10/28/21 0529  NA 137 138  K 4.7 4.7  CL 105 105  CO2 24 25  GLUCOSE 135* 139*  BUN 32* 32*  CREATININE 1.74* 1.48*  CALCIUM 7.9* 8.3*    Intake/Output Summary (Last 24 hours) at 10/28/2021 1228 Last data filed at 10/28/2021 0513 Gross per 24 hour  Intake 840 ml  Output 2050 ml  Net -1210 ml     Pressure Injury 10/15/21 Buttocks Right;Left Stage 2 -  Partial thickness loss of dermis presenting as a shallow open injury with a red, pink wound bed without slough. patchy areas of partial thickness skin loss related to moiture associated skin damage (Active)  10/15/21 1635  Location: Buttocks  Location Orientation: Right;Left  Staging: Stage 2 -  Partial thickness loss of dermis presenting as a shallow open injury with a red, pink wound bed without slough.  Wound Description (Comments): patchy areas of partial thickness skin loss related to moiture associated skin damage  Present on Admission: Yes    Physical Exam: Vital Signs Blood pressure (!) 158/93, pulse 66, temperature 98 F (36.7 C), resp. rate 20, weight (!) 156.5 kg, SpO2 93 %.  Constitutional: No distress . Vital signs reviewed. obese HEENT: NCAT, EOMI, oral membranes moist. Poor denition Neck: supple Cardiovascular: RRR without murmur. No JVD    Respiratory/Chest: CTA Bilaterally without wheezes or rales. Normal effort    GI/Abdomen: BS +, non-tender,  non-distended Ext: no clubbing, cyanosis, or edema Psych: pleasant and cooperative   GU: scrotal edema appears a little better. Has modified sling on Skin:  RLE venous stasis wound, chronic skin changes. Old crusted wounds near prior amp sites Left BK in vac, with ongoing s/s drainage in cannister. Bilateral buttock wounds Neuro:  Alert and oriented x 3. Normal insight and awareness. Intact Memory. Normal language and speech. Cranial nerve exam unremarkable. Decreased sensation distal RLE. Has 4- /5 strength UE. RLE 3/5 prox to4/5 distally. No abnl tone Musculoskeletal: left stump in vac, swollen, somewhat tender    Assessment/Plan: 1. Functional deficits which require 3+ hours per day of interdisciplinary therapy in a comprehensive inpatient rehab setting. Physiatrist is providing close team supervision and 24 hour management of active medical problems listed below. Physiatrist and rehab team continue to assess barriers to discharge/monitor patient progress toward functional and medical goals  Care Tool:  Bathing    Body parts bathed by patient: Right arm, Left arm, Chest, Abdomen, Right upper leg, Left upper leg, Face   Body parts bathed by helper: Front perineal area, Buttocks, Left lower leg, Right lower leg     Bathing assist Assist Level: Total Assistance - Patient <  25%     Upper Body Dressing/Undressing Upper body dressing   What is the patient wearing?: Pull over shirt    Upper body assist Assist Level: Moderate Assistance - Patient 50 - 74%    Lower Body Dressing/Undressing Lower body dressing      What is the patient wearing?: Pants     Lower body assist Assist for lower body dressing: 2 Helpers     Toileting Toileting    Toileting assist Assist for toileting: Dependent - Patient 0%     Transfers Chair/bed transfer  Transfers assist     Chair/bed transfer assist level: Dependent - mechanical lift     Locomotion Ambulation   Ambulation assist    Ambulation activity did not occur: Safety/medical concerns          Walk 10 feet activity   Assist  Walk 10 feet activity did not occur: Safety/medical concerns        Walk 50 feet activity   Assist Walk 50 feet with 2 turns activity did not occur: Safety/medical concerns         Walk 150 feet activity   Assist Walk 150 feet activity did not occur: Safety/medical concerns         Walk 10 feet on uneven surface  activity   Assist Walk 10 feet on uneven surfaces activity did not occur: Safety/medical concerns         Wheelchair     Assist Is the patient using a wheelchair?: Yes Type of Wheelchair: Manual    Wheelchair assist level: Moderate Assistance - Patient 50 - 74% Max wheelchair distance: 150    Wheelchair 50 feet with 2 turns activity    Assist        Assist Level: Moderate Assistance - Patient 50 - 74%   Wheelchair 150 feet activity     Assist      Assist Level: Moderate Assistance - Patient 50 - 74%   Blood pressure (!) 158/93, pulse 66, temperature 98 F (36.7 C), resp. rate 20, weight (!) 156.5 kg, SpO2 93 %.  Medical Problem List and Plan: 1. 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  Antithrombotics: -DVT/anticoagulation:  Mechanical: Antiembolism stockings, thigh (TED hose) Right lower extremity             -antiplatelet therapy: Aspirin 81 mg daily and Plavix 75 mg daily    3. Pain Management: Hydrocodone as needed -continue gabapentin for phantom limb pain, 100mg  bid 4. Mood: Provide emotional support             -antipsychotic agents: N/A 5. Neuropsych: This patient is capable of making decisions on his own behalf. 6. Skin/Wound Care: Routine skin checks.  MASD on buttocks and lymphedema/venous stasis ulcers on  RLE- was told Pressure ulcer on buttocks, but not per WOC or nursing.   WOC follow-up wound care as directed.  -active drainage thru vac.   2/10 continue vac for now. Notifed Dr. Sharol Given of ongoing drainage 7. Fluids/Electrolytes/Nutrition: Routine in and outs with follow-up chemistries 8.  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  Status post EGD and colonoscopy 10/19/2021.  Continue PPI x8 weeks. 9.  Acute blood loss anemia.  Follow-up hgb 7.6, stable--recheck Monday  -fe++ supp 10.  History of CVA.  Aspirin Plavix as prior to admission. 11.  AKI.  Baseline creatinine 1.3.    -BMET/Cr are improving--now off IVF -recheck labs Monday 12.  Hypertension.  Blood pressure somewhat soft with hydralazine discontinued Toprol decreased to 25 mg daily to help perfusion for kidneys.  -continue current toprol for now. Bp controlled 13.  Diabetes mellitus.  Hemoglobin A1c 7.3.  Semglee initiated while in the hospital 10 units daily.     CBG (last 3)  Recent Labs    10/27/21 2110 10/28/21 0610 10/28/21 1141  GLUCAP 141* 147* 132*    2/10 reasonable control at present 14.  Morbid obesity.  BMI 50.22.  Dietary follow-up 15.  Hyperlipidemia.  Lipitor 16.  History of gout.  Zyloprim 300 mg daily.  Monitor for any gout flareups 17.  Acute on chronic diastolic congestive heart failure.   Filed Weights   10/27/21 0434 10/28/21 0418  Weight: (!) 161.5 kg (!) 156.5 kg  -2/10 follow for pattern 18. R hand swelling- suggest K tape for R hand, elevation 19. Scrotal swelling- elevation/ towel--improving    LOS: 3 days A FACE TO FACE EVALUATION WAS PERFORMED  Meredith Staggers 10/28/2021, 12:28 PM

## 2021-10-29 LAB — GLUCOSE, CAPILLARY
Glucose-Capillary: 157 mg/dL — ABNORMAL HIGH (ref 70–99)
Glucose-Capillary: 126 mg/dL — ABNORMAL HIGH (ref 70–99)
Glucose-Capillary: 126 mg/dL — ABNORMAL HIGH (ref 70–99)
Glucose-Capillary: 164 mg/dL — ABNORMAL HIGH (ref 70–99)

## 2021-10-29 NOTE — Progress Notes (Signed)
Occupational Therapy Session Note  Patient Details  Name: [name] [name] MRN: [id number] Date of Birth: [birth year]  Today's Date: 10/29/2021 OT Individual Time: 1941-7408 and 1400-1500 OT Individual Time Calculation (min): 45 min and 60 min   Short Term Goals: Week 1:  OT Short Term Goal 1 (Week 1): Pt will complete BSC/toilet transfer with Mod x2 OT Short Term Goal 2 (Week 1): Pt will perform LB dress with Max A with AE PRN OT Short Term Goal 3 (Week 1): Pt will participate in task of choice/ADL for 10 minutes without rest break  Skilled Therapeutic Interventions/Progress Updates:    Visit 1:  Pain:pt did ask for pain medication for L limb, reported to nursing   Pt seen this session with +2 A to facilitate bed to w/c transfer using breezy slide board.  Pt has very  weak UB and core musculature and needs a great deal of A with bed mobility, leaning forward, lateral lean to place board, and scooting on board.  His body habitus inhibits him from having a full lean at hips.  With MAX  of 2 pt able to slide to wc.  This was a long process and numerous rest breaks needed.  Pt is not able to adequately use his R leg due to edema, hemiparesis, ankle inversion contracture.  His hands do not reach down to board easily due to his girth and his abdomen size limits his ability to lean forward.  Pt resting in wc with all needs met.  Visit 2:  Pain:  no c/o pain  Pt received in wc sleeping but awoke easily. Pt taken down to gym to work on [employer] transfers.Marland Kitchen attempted several times to place breezy board but unable to place circle pad adequately under hips for safe transfer so had to use regular board.  Leveled mat to match wc seat.  +2 MAX A to move from wc to edge of mat. Pt is not able to adequately use his R leg due to edema, hemiparesis, ankle inversion contracture.  His hands do not reach down to board easily due to his girth and his abdomen size limits his ability to lean forward. Pt needed  numerous scoots to move across board and then back onto mat. Had to place a step stool under R foot to support leg as pt's leg length is short.  Once on mat had a conversation with pt about going home and his family and caregiver doing these transfers. Informed pt we will keep working on them but realistically he will likely need a hoyer lift.  These transfers require max A of 2 and are very difficulty to facilitate due to his premorbid conditions.  Showed pt the hoyer lift and pt has been using maxisky for transfers in his room.   Pt seemed to understand the severity of his condition and that transfers are difficulty to achieve. Pt then worked with red theraband on UE exercises to increase strength of R arm from old CVA 20 yrs ago and LUE.   Hand off to next therapist.    Therapy Documentation Precautions:  Precautions Precautions: Fall Precaution Comments: new L BKA, pressure sores on L/R bottom, 2L O2, would vac Restrictions Weight Bearing Restrictions: Yes LLE Weight Bearing: Non weight bearing    Vital Signs: Therapy Vitals Temp: 99.4 F (37.4 C) Pulse Rate: 77 Resp: 14 BP: (!) 164/88 Patient Position (if appropriate): Lying Oxygen Therapy SpO2: 94 % O2 Device: Room Air    ADL: ADL  Eating: Not assessed Grooming: Supervision/safety Upper Body Bathing: Minimal assistance Lower Body Bathing: Dependent Upper Body Dressing: Moderate assistance Lower Body Dressing: Dependent Toileting: Not assessed Toilet Transfer: Not assessed Tub/Shower Transfer: Not assessed   Therapy/Group: Individual Therapy  Shona Pardo 10/29/2021, 7:55 AM

## 2021-10-29 NOTE — Progress Notes (Signed)
Physical Therapy Session Note  Patient Details  Name: [name] [name] MRN: [id number] Date of Birth: [birth year]  Today's Date: 10/29/2021 PT Individual Time: 1505-1600 PT Individual Time Calculation (min): 55 min   Short Term Goals: Week 1:  PT Short Term Goal 1 (Week 1): Patient will maintain static sitting balance EOB for >/= 50mins with no more than MinA PT Short Term Goal 2 (Week 1): Patient will transfer bed <> wc with LRAD and MaxA x1 PT Short Term Goal 3 (Week 1): Patient will propel himself at least 56ft with no more than MinA  Skilled Therapeutic Interventions/Progress Updates:    Patient received in gym with OT, he denies pain, agreeable to PT. Patient completing AROM B LE hip flex, knee flex/extend. Condom cath falling off partway through session. Discussion with patient regarding potential need for [facility] for transfers at home as well as hospital bed to reduce burden of care and increase safety. Patient receptive. Slideboard transfer to wc with MaxA x2. Patient completing x5 mins on Kinetron with R LE. Attempted slideboard transfer back to bed, however, despite MaxA x2, patient unable to safely get back into bed. MaxiMove used for safety. Patient able to reposition himself in bed using bed features. PT observing serosanginous drainage from R lateral distal leg. RN aware. 4 rails up, call light within reach.   Therapy Documentation Precautions:  Precautions Precautions: Fall Precaution Comments: new L BKA, pressure sores on L/R bottom, 2L O2, would vac Restrictions Weight Bearing Restrictions: Yes LLE Weight Bearing: Non weight bearing     Therapy/Group: Individual Therapy  Karoline Caldwell, PT, DPT, CBIS  10/29/2021, 7:45 AM

## 2021-10-30 LAB — GLUCOSE, CAPILLARY
Glucose-Capillary: 137 mg/dL — ABNORMAL HIGH (ref 70–99)
Glucose-Capillary: 141 mg/dL — ABNORMAL HIGH (ref 70–99)
Glucose-Capillary: 156 mg/dL — ABNORMAL HIGH (ref 70–99)

## 2021-10-30 NOTE — Progress Notes (Signed)
PROGRESS NOTE   Subjective/Complaints:  Pt reports doing "fine"- no issues- took ktape off R hand, and swelling much improved per pt/nursing.    ROS:  Pt denies SOB, abd pain, CP, N/V/C/D, and vision changes  Objective:   No results found. No results for input(s): WBC, HGB, HCT, PLT in the last 72 hours.  Recent Labs    10/28/21 0529  NA 138  K 4.7  CL 105  CO2 25  GLUCOSE 139*  BUN 32*  CREATININE 1.48*  CALCIUM 8.3*    Intake/Output Summary (Last 24 hours) at 10/30/2021 1611 Last data filed at 10/30/2021 1300 Gross per 24 hour  Intake 139 ml  Output 2050 ml  Net -1911 ml     Pressure Injury 10/15/21 Buttocks Right;Left Stage 2 -  Partial thickness loss of dermis presenting as a shallow open injury with a red, pink wound bed without slough. patchy areas of partial thickness skin loss related to moiture associated skin damage (Active)  10/15/21 1635  Location: Buttocks  Location Orientation: Right;Left  Staging: Stage 2 -  Partial thickness loss of dermis presenting as a shallow open injury with a red, pink wound bed without slough.  Wound Description (Comments): patchy areas of partial thickness skin loss related to moiture associated skin damage  Present on Admission: Yes    Physical Exam: Vital Signs Blood pressure (!) 156/86, pulse 73, temperature 98.5 F (36.9 C), temperature source Oral, resp. rate 20, weight (!) 156.5 kg, SpO2 93 %.     General: awake, alert, appropriate, laying in bed- initially asleep, however woke to stimuli; computer in front of him; NAD HENT:  oropharynx moist- missing some teeth CV: regular rate; no JVD Pulmonary: CTA B/L; no W/R/R- good air movement GI: soft, NT, ND, (+)BS- protuberant Psychiatric: appropriate- flat; vague Neurological:alert  Ext: no clubbing, cyanosis, or edema Psych: pleasant and cooperative   GU: scrotal edema appears a little better. Has modified  sling on Skin:  RLE venous stasis wound, chronic skin changes. Old crusted wounds near prior amp sites Left BK in vac, with ongoing s/s drainage in cannister. Bilateral buttock wounds Neuro:  Alert and oriented x 3. Normal insight and awareness. Intact Memory. Normal language and speech. Cranial nerve exam unremarkable. Decreased sensation distal RLE. Has 4- /5 strength UE. RLE 3/5 prox to4/5 distally. No abnl tone Musculoskeletal: left stump in vac, swollen, somewhat tender    Assessment/Plan: 1. Functional deficits which require 3+ hours per day of interdisciplinary therapy in a comprehensive inpatient rehab setting. Physiatrist is providing close team supervision and 24 hour management of active medical problems listed below. Physiatrist and rehab team continue to assess barriers to discharge/monitor patient progress toward functional and medical goals  Care Tool:  Bathing    Body parts bathed by patient: Right arm, Left arm, Chest, Abdomen, Right upper leg, Left upper leg, Face   Body parts bathed by helper: Front perineal area, Buttocks, Left lower leg, Right lower leg     Bathing assist Assist Level: Total Assistance - Patient < 25%     Upper Body Dressing/Undressing Upper body dressing   What is the patient wearing?: Pull over shirt  Upper body assist Assist Level: Moderate Assistance - Patient 50 - 74%    Lower Body Dressing/Undressing Lower body dressing      What is the patient wearing?: Pants     Lower body assist Assist for lower body dressing: 2 Helpers     Toileting Toileting    Toileting assist Assist for toileting: Dependent - Patient 0%     Transfers Chair/bed transfer  Transfers assist     Chair/bed transfer assist level: Dependent - mechanical lift     Locomotion Ambulation   Ambulation assist   Ambulation activity did not occur: Safety/medical concerns          Walk 10 feet activity   Assist  Walk 10 feet activity did not  occur: Safety/medical concerns        Walk 50 feet activity   Assist Walk 50 feet with 2 turns activity did not occur: Safety/medical concerns         Walk 150 feet activity   Assist Walk 150 feet activity did not occur: Safety/medical concerns         Walk 10 feet on uneven surface  activity   Assist Walk 10 feet on uneven surfaces activity did not occur: Safety/medical concerns         Wheelchair     Assist Is the patient using a wheelchair?: Yes Type of Wheelchair: Manual    Wheelchair assist level: Moderate Assistance - Patient 50 - 74% Max wheelchair distance: 150    Wheelchair 50 feet with 2 turns activity    Assist        Assist Level: Moderate Assistance - Patient 50 - 74%   Wheelchair 150 feet activity     Assist      Assist Level: Moderate Assistance - Patient 50 - 74%   Blood pressure (!) 156/86, pulse 73, temperature 98.5 F (36.9 C), temperature source Oral, resp. rate 20, weight (!) 156.5 kg, SpO2 93 %.  Medical Problem List and Plan: 1. 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  Antithrombotics: -DVT/anticoagulation:  Mechanical: Antiembolism stockings, thigh (TED hose) Right lower extremity             -antiplatelet therapy: Aspirin 81 mg daily and Plavix 75 mg daily    3. Pain Management: Hydrocodone as needed -continue gabapentin for phantom limb pain, 100mg  bid  2/12- pain controlled per pt- con't regimen  4. Mood: Provide emotional support             -antipsychotic agents: N/A 5. Neuropsych: This patient is capable of making decisions on his own behalf. 6. Skin/Wound Care: Routine skin checks.  MASD on buttocks and lymphedema/venous stasis ulcers on RLE- was told Pressure ulcer on buttocks, but not per WOC or nursing.   WOC  follow-up wound care as directed.  -active drainage thru vac.   2/10 continue vac for now. Notifed Dr. Sharol Given of ongoing drainage  2/12- con't VAC 7. Fluids/Electrolytes/Nutrition: Routine in and outs with follow-up chemistries 8.  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  Status post EGD and colonoscopy 10/19/2021.  Continue PPI x8 weeks. 9.  Acute blood loss  anemia.  Follow-up hgb 7.6, stable--recheck Monday  -fe++ supp 10.  History of CVA.  Aspirin Plavix as prior to admission. 11.  AKI.  Baseline creatinine 1.3.    -BMET/Cr are improving--now off IVF -recheck labs Monday 12.  Hypertension.  Blood pressure somewhat soft with hydralazine discontinued Toprol decreased to 25 mg daily to help perfusion for kidneys.  -continue current toprol for now. Bp controlled  2/12- BP somewhat elevated 150s/80s- however, has been controlled- will monitor for trends and con't regimen 13.  Diabetes mellitus.  Hemoglobin A1c 7.3.  Semglee initiated while in the hospital 10 units daily.     CBG (last 3)  Recent Labs    10/29/21 2119 10/30/21 0644 10/30/21 1230  GLUCAP 164* 137* 141*    2/12- reasonable control- con't regimen 14.  Morbid obesity.  BMI 50.22.  Dietary follow-up 15.  Hyperlipidemia.  Lipitor 16.  History of gout.  Zyloprim 300 mg daily.  Monitor for any gout flareups 17.  Acute on chronic diastolic congestive heart failure.   Filed Weights   10/27/21 0434 10/28/21 0418  Weight: (!) 161.5 kg (!) 156.5 kg  -2/10 follow for pattern 2/12- will make sure has daily weights- hasn't been checked in 2 days 18. R hand swelling- suggest K tape for R hand, elevation  2/12- ktape off- but swelling almost resolved 19. Scrotal swelling- elevation/ towel--improving   I spent a total of 35   minutes on total care today- >50% coordination of care- due to d/w nursing about weights; VAC and BP issues. As well as scrotal swelling/R hand  swelling.    LOS: 5 days A FACE TO FACE EVALUATION WAS PERFORMED  [name] [name] 10/30/2021, 4:11 PM

## 2021-10-31 LAB — BASIC METABOLIC PANEL
Anion gap: 8 (ref 5–15)
BUN: 23 mg/dL (ref 8–23)
CO2: 28 mmol/L (ref 22–32)
Calcium: 8.3 mg/dL — ABNORMAL LOW (ref 8.9–10.3)
Chloride: 102 mmol/L (ref 98–111)
Creatinine, Ser: 1.24 mg/dL (ref 0.61–1.24)
GFR, Estimated: >60 mL/min (ref >60)
Glucose, Bld: 115 mg/dL — ABNORMAL HIGH (ref 70–99)
Potassium: 4.2 mmol/L (ref 3.5–5.1)
Sodium: 138 mmol/L (ref 135–145)

## 2021-10-31 LAB — CBC
HCT: 26.5 % — ABNORMAL LOW (ref 39.0–52.0)
Hemoglobin: 7.8 g/dL — ABNORMAL LOW (ref 13.0–17.0)
MCH: 24.2 pg — ABNORMAL LOW (ref 26.0–34.0)
MCHC: 29.4 g/dL — ABNORMAL LOW (ref 30.0–36.0)
MCV: 82.3 fL (ref 80.0–100.0)
Platelets: 243 10*3/uL (ref 150–400)
RBC: 3.22 MIL/uL — ABNORMAL LOW (ref 4.22–5.81)
RDW: 21.9 % — ABNORMAL HIGH (ref 11.5–15.5)
WBC: 7.9 10*3/uL (ref 4.0–10.5)
nRBC: 0 % (ref 0.0–0.2)

## 2021-10-31 LAB — GLUCOSE, CAPILLARY
Glucose-Capillary: 118 mg/dL — ABNORMAL HIGH (ref 70–99)
Glucose-Capillary: 114 mg/dL — ABNORMAL HIGH (ref 70–99)
Glucose-Capillary: 133 mg/dL — ABNORMAL HIGH (ref 70–99)
Glucose-Capillary: 133 mg/dL — ABNORMAL HIGH (ref 70–99)

## 2021-10-31 NOTE — Progress Notes (Signed)
Occupational Therapy Session Note  Patient Details  Name: [name] [name] MRN: [id number] Date of Birth: December 01, 1955  Today's Date: 10/31/2021 OT Individual Time: 1000-1045 OT Individual Time Calculation (min): 45 min    Short Term Goals: Week 1:  OT Short Term Goal 1 (Week 1): Pt will complete BSC/toilet transfer with Mod x2 OT Short Term Goal 2 (Week 1): Pt will perform LB dress with Max A with AE PRN OT Short Term Goal 3 (Week 1): Pt will participate in task of choice/ADL for 10 minutes without rest break  Skilled Therapeutic Interventions/Progress Updates:    Pt received sitting in wc with maxi sky sling under him.  For first 15 minutes, pt worked on UE AROM using weighted dowel bar.  Discussed his therapy schedule today and decided it was best to get him back to bed as sitting up in wc for hours would be too taxing.   With +2 A pt transported from wc to air mattress using maxi sky lift.  Due to body habitus, it was fairly difficult getting him in the frame of the lift as his arms were getting pinched by frame initially (pt only able to cross arms so far), until able to get sling in more upright position.  Pt then positioned at edge of bed and difficult to get hooks of sling off as pt leaning back into sling, unable to use his core to stay in upright sitting.  Once sling off, pt wanting to grab my arm to pull himself up as he felt he was falling back. Told pt to reach for bed rail and to lean forward.  Pt needed max A to get into this position as he was not self initiating with his musculature.  Backed lift away and then pt needed max A to lay down and get adjusted in bed. Pt has difficulty getting himself adjusted in bed due to RLE weakness/contractures and decreased sh AROM.   Even with using a hoyer lift at home, pt will need +2 MAX assist. If this is not available, recommend pt consider a skilled nursing facility.   Pt resting in bed with all needs met.   Therapy  Documentation Precautions:  Precautions Precautions: Fall Precaution Comments: new L BKA, pressure sores on L/R bottom, 2L O2, would vac Restrictions Weight Bearing Restrictions: Yes LLE Weight Bearing: Non weight bearing    Pain: c/o chronic sh pain - premedicated   ADL: ADL Eating: Not assessed Grooming: Supervision/safety Upper Body Bathing: Minimal assistance Lower Body Bathing: Dependent Upper Body Dressing: Moderate assistance Lower Body Dressing: Dependent Toileting: Not assessed Toilet Transfer: Not assessed Tub/Shower Transfer: Not assessed   Therapy/Group: Individual Therapy  [place] 10/31/2021, 8:26 AM

## 2021-10-31 NOTE — Progress Notes (Signed)
PROGRESS NOTE   Subjective/Complaints:  Patient seen in his room today, continues with high-volume wound VAC output left BKA.  Swelling is still significant right upper and right lower limb although the patient states it is not that much different than normal for him.  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 Recent Labs    10/31/21 0500  WBC 7.9  HGB 7.8*  HCT 26.5*  PLT 243    Recent Labs    10/31/21 0500  NA 138  K 4.2  CL 102  CO2 28  GLUCOSE 115*  BUN 23  CREATININE 1.24  CALCIUM 8.3*     Intake/Output Summary (Last 24 hours) at 10/31/2021 1255 Last data filed at 10/31/2021 0700 Gross per 24 hour  Intake 739 ml  Output 2875 ml  Net -2136 ml      Pressure Injury 10/15/21 Buttocks Right;Left Stage 2 -  Partial thickness loss of dermis presenting as a shallow open injury with a red, pink wound bed without slough. patchy areas of partial thickness skin loss related to moiture associated skin damage (Active)  10/15/21 1635  Location: Buttocks  Location Orientation: Right;Left  Staging: Stage 2 -  Partial thickness loss of dermis presenting as a shallow open injury with a red, pink wound bed without slough.  Wound Description (Comments): patchy areas of partial thickness skin loss related to moiture associated skin damage  Present on Admission: Yes    Physical Exam: Vital Signs Blood pressure 139/73, pulse 64, temperature 98.6 F (37 C), temperature source Oral, resp. rate 20, weight (!) 157.9 kg, SpO2 94 %.   General: No acute distress Mood and affect are appropriate Heart: Regular rate and rhythm no rubs murmurs or extra sounds Lungs: Clear to auscultation, breathing unlabored, no rales or wheezes Abdomen: Positive bowel sounds, soft nontender to palpation, nondistended Extremities: No  clubbing, cyanosis, or edema Skin: No evidence of breakdown, no evidence of rash   Ext: 3+ edema right pedal and pretibial, 2+ in the right dorsum of the hand and forearm Psych: pleasant and cooperative   GU: scrotal edema appears a little better. Has modified sling on Skin:  RLE venous stasis wound, chronic skin changes. Old crusted wounds near prior amp sites Left BK in vac, with ongoing s/s drainage in cannister. Bilateral buttock wounds Neuro:  Alert and oriented x 3. Normal insight and awareness. Intact Memory. Normal language and speech. Cranial nerve exam unremarkable. Decreased sensation distal RLE. Has 4- /5 strength UE. RLE 3/5 prox to4/5 distally. No abnl tone Musculoskeletal: left stump in vac, swollen, somewhat tender    Assessment/Plan: 1. Functional deficits which require 3+ hours per day of interdisciplinary therapy in a comprehensive inpatient rehab setting. Physiatrist is providing close team supervision and 24 hour management of active medical problems listed below. Physiatrist and rehab team continue to assess barriers to discharge/monitor patient progress toward functional and medical goals  Care Tool:  Bathing    Body parts bathed by patient: Right arm, Left arm, Chest, Abdomen, Right upper leg, Left upper leg, Face  Body parts bathed by helper: Front perineal area, Buttocks, Left lower leg, Right lower leg     Bathing assist Assist Level: Total Assistance - Patient < 25%     Upper Body Dressing/Undressing Upper body dressing   What is the patient wearing?: Pull over shirt    Upper body assist Assist Level: Moderate Assistance - Patient 50 - 74%    Lower Body Dressing/Undressing Lower body dressing      What is the patient wearing?: Pants     Lower body assist Assist for lower body dressing: 2 Helpers     Toileting Toileting    Toileting assist Assist for toileting: Dependent - Patient 0%     Transfers Chair/bed transfer  Transfers assist      Chair/bed transfer assist level: Dependent - mechanical lift     Locomotion Ambulation   Ambulation assist   Ambulation activity did not occur: Safety/medical concerns          Walk 10 feet activity   Assist  Walk 10 feet activity did not occur: Safety/medical concerns        Walk 50 feet activity   Assist Walk 50 feet with 2 turns activity did not occur: Safety/medical concerns         Walk 150 feet activity   Assist Walk 150 feet activity did not occur: Safety/medical concerns         Walk 10 feet on uneven surface  activity   Assist Walk 10 feet on uneven surfaces activity did not occur: Safety/medical concerns         Wheelchair     Assist Is the patient using a wheelchair?: Yes Type of Wheelchair: Manual    Wheelchair assist level: Moderate Assistance - Patient 50 - 74% Max wheelchair distance: 150    Wheelchair 50 feet with 2 turns activity    Assist        Assist Level: Moderate Assistance - Patient 50 - 74%   Wheelchair 150 feet activity     Assist      Assist Level: Moderate Assistance - Patient 50 - 74%   Blood pressure 139/73, pulse 64, temperature 98.6 F (37 C), temperature source Oral, resp. rate 20, weight (!) 157.9 kg, SpO2 94 %.  Medical Problem List and Plan: 1. 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  Antithrombotics: -DVT/anticoagulation:  Mechanical: Antiembolism stockings, thigh (TED hose) Right lower extremity             -antiplatelet therapy: Aspirin 81 mg daily and Plavix 75 mg daily    3. Pain Management: Hydrocodone as needed -continue gabapentin for phantom limb pain, 100mg  bid  2/12- pain controlled per pt- con't regimen  4. Mood: Provide emotional support             -antipsychotic agents: N/A 5.  Neuropsych: This patient is capable of making decisions on his own behalf. 6. Skin/Wound Care: Routine skin checks.  MASD on buttocks and lymphedema/venous stasis ulcers on RLE- was told Pressure ulcer on buttocks, but not per WOC or nursing.   WOC follow-up wound care as directed.  -  2/13 continue wound VAC until drainage tapers 7. Fluids/Electrolytes/Nutrition: Routine in  and outs with follow-up chemistries 8.  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  Status post EGD and colonoscopy 10/19/2021.  Continue PPI x8 weeks. 9.  Acute blood loss anemia. Stable 2/13 CBC Latest Ref Rng & Units 10/31/2021 10/25/2021 10/24/2021  WBC 4.0 - 10.5 K/uL 7.9 8.6 10.0  Hemoglobin 13.0 - 17.0 g/dL 7.8(L) 7.6(L) 7.6(L)  Hematocrit 39.0 - 52.0 % 26.5(L) 25.1(L) 25.1(L)  Platelets 150 - 400 K/uL 243 229 242     -fe++ supp 10.  History of CVA.  Aspirin Plavix as prior to admission. 11.  AKI.  Baseline creatinine 1.3.    -BMET/Cr are improving--now off IVF -recheck labs Monday 12.  Hypertension.  Blood pressure somewhat soft with hydralazine discontinued Toprol decreased to 25 mg daily to help perfusion for kidneys.  -continue current toprol for now. Bp controlled  2/12- BP somewhat elevated 150s/80s- however, has been controlled- will monitor for trends and con't regimen 13.  Diabetes mellitus.  Hemoglobin A1c 7.3.  Semglee initiated while in the hospital 10 units daily.     CBG (last 3)  Recent Labs    10/30/21 2115 10/31/21 0606 10/31/21 1136  GLUCAP 156* 118* 114*     2/13 good control 14.  Morbid obesity.  BMI 50.22.  Dietary follow-up 15.  Hyperlipidemia.  Lipitor 16.  History of gout.  Zyloprim 300 mg daily.  Monitor for any gout flareups 17.  Acute on chronic diastolic congestive heart failure.   Filed Weights   10/27/21 0434 10/28/21 0418 10/31/21 0309  Weight: (!) 161.5 kg (!) 156.5 kg (!) 157.9 kg  Last 2 readings have been  fairly stable continue to monitor  18. R hand swelling- suggest K tape for R hand, elevation  2/12- ktape off- but swelling almost resolved 19. Scrotal swelling- elevation/ towel--improving      LOS: 6 days A FACE TO FACE EVALUATION WAS PERFORMED  Charlett Blake 10/31/2021, 12:55 PM

## 2021-10-31 NOTE — Progress Notes (Signed)
Physical Therapy Session Note  Patient Details  Name: [name] [name] MRN: [id number] Date of Birth: 06-17-1956  Today's Date: 10/31/2021 PT Individual Time: 3825-0539; 7673-4193 PT Individual Time Calculation (min): 58 min and 55 mins  Short Term Goals: Week 1:  PT Short Term Goal 1 (Week 1): Patient will maintain static sitting balance EOB for >/= 10mins with no more than MinA PT Short Term Goal 2 (Week 1): Patient will transfer bed <> wc with LRAD and MaxA x1 PT Short Term Goal 3 (Week 1): Patient will propel himself at least 45ft with no more than MinA  Skilled Therapeutic Interventions/Progress Updates:    Session 1: Patient received reclined in bed, agreeable to PT. He reports phantom pain in L LE, unrated, premedicated. PT providing rest breaks, distractions and repositioning to assist with pain management. PT retrieving 20x20 wc to allow for greater depth and therefore leg support in chair. ROHO cushion provided due to skin breakdown as well. Patient requiring MaxA to roll to place Maximove sling. MaxiMove used to transfer to wc. He required ModA/MaxA to propel wc due to limited grip in R UE. PT discussing ways to modify wc to improve efficiency- PT adding theraband to assist with grip on R rim with little noted success. PT also discussing importance of edema management in R LE to ensure skin integrity and maximize function of R LE for assist with transfers. Patient remaining up in wc, seatbelt alarm on, call light within reach.   Session 2: Patient received sitting up in wc, agreeable to PT. He reports discomfort in L posterior thigh related to position of slideboard under L RL due to facility not having a L amputee pad for this wc. PT removed slideboard to increase patient comfort. PT transporting patient in wc to therapy gym for time management and energy conservation. He completed the following therex: LAQ, marches, seated chest press with 2# dowel, punches all 3x12. Patient with  significant weakness in R hemibody related to prior cva. Patient returning to room in wc, requesting to get back into bed. MaxiMove used for safety and efficiency. He required ModA/MaxA to roll to remove harness. Patient remaining in bed, 4 rails up, call light within reach.   Therapy Documentation Precautions:  Precautions Precautions: Fall Precaution Comments: new L BKA, pressure sores on L/R bottom, 2L O2, would vac Restrictions Weight Bearing Restrictions: Yes LLE Weight Bearing: Non weight bearing    Therapy/Group: Individual Therapy  Karoline Caldwell, PT, DPT, CBIS  10/31/2021, 7:32 AM

## 2021-10-31 NOTE — Progress Notes (Signed)
Physical Therapy Session Note  Patient Details  Name: [name] [name] MRN: [id number] Date of Birth: [birth year]  Today's Date: 10/31/2021 PT Individual Time: 1302-1333 PT Individual Time Calculation (min): 31 min   Short Term Goals: Week 1:  PT Short Term Goal 1 (Week 1): Patient will maintain static sitting balance EOB for >/= 35mins with no more than MinA PT Short Term Goal 2 (Week 1): Patient will transfer bed <> wc with LRAD and MaxA x1 PT Short Term Goal 3 (Week 1): Patient will propel himself at least 75ft with no more than MinA  Skilled Therapeutic Interventions/Progress Updates:     Pt received semi-reclined in bed and agrees to therapy. Reports 2/10 in residual limb. PT provides rest breaks as needed to manage pain. Pt performs supine to sit with bed features and minA. Pt performs sliding board transfer to the R with modA +2 and multiple small scoots required to transition to [place]. PT provides verbal and tactile cues for body mechanics and use of head hips relationship. Pt is able to scoot bottom to back of WC using bilateral upper extremities and arm rests with cues for initiation. Pt self propels WC x175' with bilateral upper extremities and minA/modA to maintain momentum for effective propulsions. Cues for propulsion technique and body mechanics. Pt left seated in WC with alarm intact and all needs within reach.  Therapy Documentation Precautions:  Precautions Precautions: Fall Precaution Comments: new L BKA, pressure sores on L/R bottom, 2L O2, would vac Restrictions Weight Bearing Restrictions: Yes LLE Weight Bearing: Non weight bearing   Therapy/Group: Individual Therapy  Breck Coons, PT, DPT 10/31/2021, 1:37 PM

## 2021-10-31 NOTE — Plan of Care (Signed)
°  Problem: Sit to Stand Goal: LTG:  Patient will perform sit to stand with assistance level (PT) Description: LTG:  Patient will perform sit to stand with assistance level (PT) Outcome: Not Applicable Flowsheets (Taken 10/31/2021 1237) LTG: PT will perform sit to stand in preparation for functional mobility with assistance level: (d/c due to patient progress) -- Note: D/c due to patient progress   Problem: RH Balance Goal: LTG Patient will maintain dynamic sitting balance (PT) Description: LTG:  Patient will maintain dynamic sitting balance with assistance during mobility activities (PT) Flowsheets (Taken 10/31/2021 1237) LTG: Pt will maintain dynamic sitting balance during mobility activities with:: (downgraded due to patient progress) Minimal Assistance - Patient > 75% Note: downgraded due to patient progress   Problem: RH Bed to Chair Transfers Goal: LTG Patient will perform bed/chair transfers w/assist (PT) Description: LTG: Patient will perform bed to chair transfers with assistance (PT). Flowsheets (Taken 10/31/2021 1237) LTG: Pt will perform Bed to Chair Transfers with assistance level: (downgraded due to patient progress) Moderate Assistance - Patient 50 - 74% Note: downgraded due to patient progress   Problem: RH Car Transfers Goal: LTG Patient will perform car transfers with assist (PT) Description: LTG: Patient will perform car transfers with assistance (PT). Flowsheets (Taken 10/31/2021 1237) LTG: Pt will perform car transfers with assist:: (downgraded due to patient progress) Moderate Assistance - Patient 50 - 74% Note: downgraded due to patient progress   Problem: RH Furniture Transfers Goal: LTG Patient will perform furniture transfers w/assist (OT/PT) Description: LTG: Patient will perform furniture transfers  with assistance (OT/PT). Flowsheets (Taken 10/31/2021 1237) LTG: Pt will perform furniture transfers with assist:: (downgraded due to patient progress) Moderate  Assistance - Patient 50 - 74% Note: downgraded due to patient progress   Problem: RH Wheelchair Mobility Goal: LTG Patient will propel w/c in controlled environment (PT) Description: LTG: Patient will propel wheelchair in controlled environment, # of feet with assist (PT) Flowsheets (Taken 10/31/2021 1237) LTG: Pt will propel w/c in controlled environ  assist needed:: (downgraded due to patient progress) Contact Guard/Touching assist Note: downgraded due to patient progress Goal: LTG Patient will propel w/c in home environment (PT) Description: LTG: Patient will propel wheelchair in home environment, # of feet with assistance (PT). Flowsheets (Taken 10/31/2021 1237) LTG: Pt will propel w/c in home environ  assist needed:: (downgraded due to patient progress) Contact Guard/Touching assist Note: downgraded due to patient progress

## 2021-11-01 LAB — GLUCOSE, CAPILLARY
Glucose-Capillary: 129 mg/dL — ABNORMAL HIGH (ref 70–99)
Glucose-Capillary: 120 mg/dL — ABNORMAL HIGH (ref 70–99)
Glucose-Capillary: 133 mg/dL — ABNORMAL HIGH (ref 70–99)
Glucose-Capillary: 119 mg/dL — ABNORMAL HIGH (ref 70–99)

## 2021-11-01 MED ORDER — FUROSEMIDE 20 MG PO TABS
20.0000 mg | ORAL_TABLET | Freq: Every day | ORAL | Status: DC
  Administered 2021-11-01 – 2021-11-04 (×4): 20 mg via ORAL
  Filled 2021-11-01 (×4): qty 1

## 2021-11-01 NOTE — Patient Care Conference (Signed)
Inpatient RehabilitationTeam Conference and Plan of Care Update Date: 11/01/2021   Time: 12:04 PM    Patient Name: [name] [name]      Medical Record Number: [id number]  Date of Birth: 09/14/1956 Sex: Male         Room/Bed: 4W14C/4W14C-01 Payor Info: Payor: MEDICARE / Plan: MEDICARE PART A AND B / Product Type: *No Product type* /    Admit Date/Time:  10/25/2021 12:32 PM  Primary Diagnosis:  Left below-knee amputee [facility])  Hospital Problems: Principal Problem:   Left below-knee amputee [facility])    Expected Discharge Date: Expected Discharge Date:  (SNF)  Team Members Present: Physician leading conference: Dr. Courtney Heys Social Worker Present: Ovidio Kin, LCSW Nurse Present: Dorien Chihuahua, RN PT Present: Ginnie Smart, PT OT Present: Meriel Pica, OT SLP Present: Weston Anna, SLP PPS Coordinator present : Gunnar Fusi, SLP     Current Status/Progress Goal Weekly Team Focus  Bowel/Bladder     Continent of bowel, using condom for bladder management   Continent without use of aides   Toileting, and assist with use of urinal, limited condom use to HS  Swallow/Nutrition/ Hydration             ADL's   max A overall with bathing and dressing, transfers max of 2 using slide board or maxisky, difficulty with using arms due body habitus and premorbid shoulder issues  mod LB dressing, toileting; CGA toilet transfer (need down grade goals at PN interval on 2/16).  ADL and functional mobility training, limited family support for amount of care he will need at home   Mobility   MaxA bed mob, MaxA/TotalA x2 or MaxiMove bed <> wc, ModA wc mobility primarily due to limited R UE strength related to previous CVA, unable to progress to sit <> stand due to inadequate R LE strength  downgraded to [place], may benefit from d/c to SNF due to very limited support at home  functional strength, transfers, wc mob, pt/fam ed   Communication             Safety/Cognition/ Behavioral Observations             Pain     Vicodin prn pain management   Pain at or below level 4 with prn meds   Assess need for and effectiveness of prn meds  Skin     MASD to buttocks, wound vac left leg, dressing to right calf/shin wound   Wound vac off MASD healed, patient able to direct caregivers on dressing change to right leg   Wound care right leg, MD to check on drainage/removal of wound vac on left incision, keep buttocks clean and dry, turning and on air mattress    Discharge Planning:  Will disucss with pt realistic discharge plan-he does have the option to go to SNF from rehab. Do not see his girlfriend providing mod-max assist   Team Discussion: Patient tries however struggles with transfers due to debility and body habitus complicated by poor health status and very little help at home. 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  Patient on target to meet rehab goals: Patient progress is limited and currently requires max assist overall.Needs max assist of 2 for slide-board transfers or using a lift. Unable to progress to sit - stand due to poor strength in right leg.   *See Care Plan and progress notes for long and short-term goals.   Revisions to Treatment  Plan:  Downgraded goals  Teaching Needs: Safety, transfers, toileting, skin care, medications, secondary risk management, etc  Current Barriers to Discharge: Wound care, Lack of/limited family support, Weight, and Weight bearing restrictions  Possible Resolutions to Barriers: SNF recommended     Medical Summary Current Status: Severe right lower extremity greater than right upper extremity edema persist, continued large volume serosanguineous fluid through wound VAC  Barriers to Discharge: Weight;Wound care   Possible Resolutions to Barriers/Weekly Focus: Starting diuretic, monitor electrolytes, continue wound VAC, may need Ortho reevaluation   Continued Need for Acute  Rehabilitation Level of Care: The patient requires daily medical management by a physician with specialized training in physical medicine and rehabilitation for the following reasons: Direction of a multidisciplinary physical rehabilitation program to maximize functional independence : Yes Medical management of patient stability for increased activity during participation in an intensive rehabilitation regime.: Yes Analysis of laboratory values and/or radiology reports with any subsequent need for medication adjustment and/or medical intervention. : Yes   I attest that I was present, lead the team conference, and concur with the assessment and plan of the team.   [name] 11/01/2021, 2:28 PM

## 2021-11-01 NOTE — Progress Notes (Signed)
Patient ID: CASEY FYE, male   DOB: [birth year], 66 y.o.   MRN: [id number]  Met with pt and daughter who was present in his room he gave worker permission to speak in front of her. Updated him regarding team conference goals of mod assist and recommendation from therapists for SNF and then after there go home. Pt's wife was in several NH's and does not want to go to one. Discussed the main issue will be if daughter and/or girlfriend can manage him at home and his care needs. Discussed he is much care and it would be difficult for one person to provide this level of care. He and daughter will discuss with girlfriend and decide what the plan will be. Offered for them to come in and go through therapies with him. Aware will be here one week for medical issues then will be ready to transfer to the next level of care. Will await their decision regarding discharge plan. Will begin paperwork for SNF which will probably be the plan

## 2021-11-01 NOTE — Progress Notes (Signed)
Physical Therapy Session Note  Patient Details  Name: [name] [name]: [id number] Date of Birth: 11/05/1955  Today's Date: 11/01/2021 PT Individual Time: 1030-1126, 1400-1445 PT Individual Time Calculation (min): 56 min, 45 min  Short Term Goals: Week 1:  PT Short Term Goal 1 (Week 1): Patient will maintain static sitting balance EOB for >/= 11mins with no more than MinA PT Short Term Goal 2 (Week 1): Patient will transfer bed <> wc with LRAD and MaxA x1 PT Short Term Goal 3 (Week 1): Patient will propel himself at least 73ft with no more than MinA  Skilled Therapeutic Interventions/Progress Updates:     Session 1: Pt seated in w/c on arrival and agreeable to therapy. Reports 4/10 residual limb, premedicated. Rest and positioning provided as needed. Pt propelled w/c x 80 ft, mod A. Pt with difficulty d/t sliding down in w/c continually throughout session. Pt then utilized kinetron 4 x 30 steps with RLE at 10 cm/sec for posterior chain strength and endurance. Pt performed w/c push ups 3 x 6 for UE strength to progress transfers and RW use. Pt unable to clear bottom, but reported feeling muscle fatigue in triceps. Performed alternating chest press and OH press with 3lb dowel for UE strength and endurance. Pt with incr difficulty with RUE, leading to active assist technique. Pt returned to room and was left with all needs in reach and alarm active.   Session 2: pt received in bed and agreeable to therapy. Consulted with RN who informed therapist that pt is awaiting new condom cath, which arrived during session. Pt with extremely saturated brief. Doffed/donned brief with max A x 2, rolling min A. Assisted NT with donning condom cath and completing brief change. Pt's sister present and with several questions about pt's care and d/c to SNF, including prognosis/ability to walk with prosthesis, which therapist answered to the best of their ability. Pt performed knee/hip extension with RTB for strength  to progress toward standing, 3 x 10. Pt remained in bed and was left with all needs in reach and alarm active.   Therapy Documentation Precautions:  Precautions Precautions: Fall Precaution Comments: new L BKA, pressure sores on L/R bottom, 2L O2, would vac Restrictions Weight Bearing Restrictions: Yes LLE Weight Bearing: Non weight bearing General:       Therapy/Group: Individual Therapy  Mickel Fuchs 11/01/2021, 10:53 AM

## 2021-11-01 NOTE — Progress Notes (Signed)
Physical Therapy Session Note  Patient Details  Name: [name] [name] MRN: [id number] Date of Birth: 03-29-1956  Today's Date: 11/01/2021 PT Individual Time: 0800-0854 PT Individual Time Calculation (min): 54 min   Short Term Goals: Week 1:  PT Short Term Goal 1 (Week 1): Patient will maintain static sitting balance EOB for >/= 35mins with no more than MinA PT Short Term Goal 2 (Week 1): Patient will transfer bed <> wc with LRAD and MaxA x1 PT Short Term Goal 3 (Week 1): Patient will propel himself at least 53ft with no more than MinA  Skilled Therapeutic Interventions/Progress Updates:      Pt supine in bed to start session. Agreeable to PT tx. Denies pain. Donned athletic shorts at bed level, assist for threading catheter and wound vac through shorts. Rolling in bed with modA bilaterally (difficulty achieving full sidelying due to body habitus and air mattress) - required totalA for donning shorts over hips. Completed the following bed level there-ex for strengthening and improving activity tolerance:  LLE: SLR, hip abduction, R sidelying hip extensions *2x10, VC throughout for muscle activation, sequencing, and technique  RLE: heel slides (AAROM), hip abduction (AAROM),  *2x10, VC throughout for muscle activation, sequencing, and technique  Glut sets 2x10  Supine<>sitting EOB with mod/maxA with HOB raised and use of bed rail - primarily required assist for trunk support and +2 assist for stabilizing hips to prevent sliding too far anteriorly to EOB. Once EOB, required +2 assist for repositioning with some posterior scooting and lateral scooting on EOB. Completed SB transfer with +2 modA from EOB to w/c, towards his R side - VC/TC needed to promote forward weight shifting, forward trunk lean as he has tendency to lean too far posteriorly causing hips to slide forward. Body habitus also impacting this. Required several small scoots with +2 assist for trunk support and stabilizing w/c to  safely transfer.  Required +2 assist for repositioning in w/c, due to sliding forward. Provided pillow for back support to reduce backwards lean in chair.   Anticipate need for custom w/c with improved depth, possible dump, to accomodate for body habitus and BKA.  Pt concluded session seated in w/c, safety belt alarm on, made comfortable, informed of upcoming therapy schedule.   Therapy Documentation Precautions:  Precautions Precautions: Fall Precaution Comments: new L BKA, pressure sores on L/R bottom, 2L O2, would vac Restrictions Weight Bearing Restrictions: Yes LLE Weight Bearing: Non weight bearing General:    Therapy/Group: Individual Therapy  Alger Simons 11/01/2021, 7:37 AM

## 2021-11-01 NOTE — Progress Notes (Signed)
Occupational Therapy Session Note  Patient Details  Name: [name] [name] MRN: [id number] Date of Birth: [birth year]  Today's Date: 11/01/2021 OT Individual Time: 0915-1000 OT Individual Time Calculation (min): 45 min    Short Term Goals: Week 1:  OT Short Term Goal 1 (Week 1): Pt will complete BSC/toilet transfer with Mod x2 OT Short Term Goal 2 (Week 1): Pt will perform LB dress with Max A with AE PRN OT Short Term Goal 3 (Week 1): Pt will participate in task of choice/ADL for 10 minutes without rest break  Skilled Therapeutic Interventions/Progress Updates:    Pt received in w/c in heavy posterior pelvic lean with hips 2 inches from back of chair. Worked numerous times this session on repositioning with pt scooting his hips back using a forward lean of his shoulders and his arms on arm rests. Pt will often say he can't do it so needs MAX cues to push himself to put effort in to fully use his arms.  Pt c/o limited back support, obtained a harder back insert to help him with upright posture. Pt worked on resisted tricep extension with red theraband 15  x3 B arms.  B arm pulls.  L knee ext with isometric holds.  Attempts at R hip flexion and knee ext but very limited ROM and strength due to previous CVA 20 yrs ago.  To adjust back into seat further, had pt use R foot on bench and stabilized bed to allow pt to push through foot slightly. He has to use his arms for most of the effort as his RLE is not able to assist him.  Pt resting in wc with belt alarm on and all needs met.   Therapy Documentation Precautions:  Precautions Precautions: Fall Precaution Comments: new L BKA, pressure sores on L/R bottom, 2L O2, would vac Restrictions Weight Bearing Restrictions: Yes LLE Weight Bearing: Non weight bearing    Vital Signs: Therapy Vitals Temp: 98.1 F (36.7 C) Temp Source: Oral Pulse Rate: 71 Resp: 14 BP: (!) 182/81 Patient Position (if appropriate): Lying Oxygen Therapy SpO2: 94  % O2 Device: Room Air Pain: Pain Assessment Pain Scale: 0-10 Pain Score: 0-No pain ADL: ADL Eating: Not assessed Grooming: Supervision/safety Upper Body Bathing: Minimal assistance Lower Body Bathing: Dependent Upper Body Dressing: Moderate assistance Lower Body Dressing: Dependent Toileting: Not assessed Toilet Transfer: Not assessed Tub/Shower Transfer: Not assessed    Therapy/Group: Individual Therapy  [place] 11/01/2021, 8:27 AM

## 2021-11-01 NOTE — Progress Notes (Signed)
PROGRESS NOTE   Subjective/Complaints:    ROS:  Pt denies SOB, abd pain, CP, N/V/C/D, and vision changes  Objective:   No results found. Recent Labs    10/31/21 0500  WBC 7.9  HGB 7.8*  HCT 26.5*  PLT 243     Recent Labs    10/31/21 0500  NA 138  K 4.2  CL 102  CO2 28  GLUCOSE 115*  BUN 23  CREATININE 1.24  CALCIUM 8.3*     Intake/Output Summary (Last 24 hours) at 11/01/2021 4268 Last data filed at 10/31/2021 1836 Gross per 24 hour  Intake 480 ml  Output 475 ml  Net 5 ml      Pressure Injury 10/15/21 Buttocks Right;Left Stage 2 -  Partial thickness loss of dermis presenting as a shallow open injury with a red, pink wound bed without slough. patchy areas of partial thickness skin loss related to moiture associated skin damage (Active)  10/15/21 1635  Location: Buttocks  Location Orientation: Right;Left  Staging: Stage 2 -  Partial thickness loss of dermis presenting as a shallow open injury with a red, pink wound bed without slough.  Wound Description (Comments): patchy areas of partial thickness skin loss related to moiture associated skin damage  Present on Admission: Yes    Physical Exam: Vital Signs Blood pressure (!) 182/81, pulse 71, temperature 98.1 F (36.7 C), temperature source Oral, resp. rate 14, weight (!) 157.9 kg, SpO2 94 %.   General: No acute distress Mood and affect are appropriate Heart: Regular rate and rhythm no rubs murmurs or extra sounds Lungs: Clear to auscultation, breathing unlabored, no rales or wheezes Abdomen: Positive bowel sounds, soft nontender to palpation, nondistended Extremities: No clubbing, cyanosis, or edema Skin: No evidence of breakdown, no evidence of rash   Ext: 3+ edema right pedal and pretibial, 2+ in the right dorsum of the hand and forearm Psych: pleasant and cooperative   GU: scrotal edema appears a little better. Has modified sling on Skin:   RLE venous stasis wound, chronic skin changes. Old crusted wounds near prior amp sites Left BK in vac, with ongoing s/s drainage in cannister. Bilateral buttock wounds Neuro:  Alert and oriented x 3. Normal insight and awareness. Intact Memory. Normal language and speech. Cranial nerve exam unremarkable. Decreased sensation distal RLE. Has 4- /5 strength UE. RLE 3/5 prox to4/5 distally. No abnl tone Musculoskeletal: left stump in vac, swollen, somewhat tender    Assessment/Plan: 1. Functional deficits which require 3+ hours per day of interdisciplinary therapy in a comprehensive inpatient rehab setting. Physiatrist is providing close team supervision and 24 hour management of active medical problems listed below. Physiatrist and rehab team continue to assess barriers to discharge/monitor patient progress toward functional and medical goals  Care Tool:  Bathing    Body parts bathed by patient: Right arm, Left arm, Chest, Abdomen, Right upper leg, Left upper leg, Face   Body parts bathed by helper: Front perineal area, Buttocks, Left lower leg, Right lower leg     Bathing assist Assist Level: Total Assistance - Patient < 25%     Upper Body Dressing/Undressing Upper body dressing   What is the  patient wearing?: Pull over shirt    Upper body assist Assist Level: Moderate Assistance - Patient 50 - 74%    Lower Body Dressing/Undressing Lower body dressing      What is the patient wearing?: Pants     Lower body assist Assist for lower body dressing: 2 Helpers     Toileting Toileting    Toileting assist Assist for toileting: Dependent - Patient 0%     Transfers Chair/bed transfer  Transfers assist     Chair/bed transfer assist level: Dependent - mechanical lift     Locomotion Ambulation   Ambulation assist   Ambulation activity did not occur: Safety/medical concerns          Walk 10 feet activity   Assist  Walk 10 feet activity did not occur:  Safety/medical concerns        Walk 50 feet activity   Assist Walk 50 feet with 2 turns activity did not occur: Safety/medical concerns         Walk 150 feet activity   Assist Walk 150 feet activity did not occur: Safety/medical concerns         Walk 10 feet on uneven surface  activity   Assist Walk 10 feet on uneven surfaces activity did not occur: Safety/medical concerns         Wheelchair     Assist Is the patient using a wheelchair?: Yes Type of Wheelchair: Manual    Wheelchair assist level: Moderate Assistance - Patient 50 - 74% Max wheelchair distance: 150    Wheelchair 50 feet with 2 turns activity    Assist        Assist Level: Moderate Assistance - Patient 50 - 74%   Wheelchair 150 feet activity     Assist      Assist Level: Moderate Assistance - Patient 50 - 74%   Blood pressure (!) 182/81, pulse 71, temperature 98.1 F (36.7 C), temperature source Oral, resp. rate 14, weight (!) 157.9 kg, SpO2 94 %.  Medical Problem List and Plan: 1. 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  Antithrombotics: -DVT/anticoagulation:  Mechanical: Antiembolism stockings, thigh (TED hose) Right lower extremity             -antiplatelet therapy: Aspirin 81 mg daily and Plavix 75 mg daily    3. Pain Management: Hydrocodone as needed -continue gabapentin for phantom limb pain, 100mg  bid  2/12- pain controlled per pt- con't regimen  4. Mood: Provide emotional support             -antipsychotic agents: N/A 5. Neuropsych: This patient is capable of making decisions on his own behalf. 6. Skin/Wound Care: Routine skin checks.  MASD on buttocks and lymphedema/venous stasis ulcers on RLE- was told Pressure ulcer on buttocks, but not per WOC or nursing.   WOC follow-up  wound care as directed.  -  2/13 continue wound VAC until drainage tapers 7. Fluids/Electrolytes/Nutrition: Routine in and outs with follow-up chemistries 8.  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  Status post EGD and colonoscopy 10/19/2021.  Continue PPI x8 weeks. 9.  Acute blood loss anemia. Stable 2/13 CBC  Latest Ref Rng & Units 10/31/2021 10/25/2021 10/24/2021  WBC 4.0 - 10.5 K/uL 7.9 8.6 10.0  Hemoglobin 13.0 - 17.0 g/dL 7.8(L) 7.6(L) 7.6(L)  Hematocrit 39.0 - 52.0 % 26.5(L) 25.1(L) 25.1(L)  Platelets 150 - 400 K/uL 243 229 242     -fe++ supp 10.  History of CVA.  Aspirin Plavix as prior to admission. 11.  AKI.  Baseline creatinine 1.3.    -BMET/Cr are improving--now off IVF -recheck labs Monday 12.  Hypertension.  Blood pressure somewhat soft with hydralazine discontinued Toprol decreased to 25 mg daily to help perfusion for kidneys.   Vitals:   10/31/21 2033 11/01/21 0558  BP: (!) 154/83 (!) 182/81  Pulse: 69 71  Resp: 16 14  Temp: 98.1 F (36.7 C) 98.1 F (36.7 C)  SpO2: 93% 94%  Start lasix and monitor   13.  Diabetes mellitus.  Hemoglobin A1c 7.3.  Semglee initiated while in the hospital 10 units daily.     CBG (last 3)  Recent Labs    10/31/21 1627 10/31/21 2149 11/01/21 0645  GLUCAP 133* 133* 129*     2/14 14.  Morbid obesity.  BMI 50.22.  Dietary follow-up 15.  Hyperlipidemia.  Lipitor 16.  History of gout.  Zyloprim 300 mg daily.  Monitor for any gout flareups 17.  Acute on chronic diastolic congestive heart failure.  Still with 3+ edema LE on RIght will resume lasix but at lower dose  Filed Weights   10/27/21 0434 10/28/21 0418 10/31/21 0309  Weight: (!) 161.5 kg (!) 156.5 kg (!) 157.9 kg  Last 2 readings have been fairly stable continue to monitor  18. R hand swelling- suggest K tape for R hand, elevation  2/12- ktape off- but swelling almost resolved 19. Scrotal swelling- elevation/  towel--improving      LOS: 7 days A FACE TO FACE EVALUATION WAS PERFORMED  Charlett Blake 11/01/2021, 9:09 AM

## 2021-11-02 LAB — GLUCOSE, CAPILLARY
Glucose-Capillary: 127 mg/dL — ABNORMAL HIGH (ref 70–99)
Glucose-Capillary: 122 mg/dL — ABNORMAL HIGH (ref 70–99)
Glucose-Capillary: 144 mg/dL — ABNORMAL HIGH (ref 70–99)
Glucose-Capillary: 139 mg/dL — ABNORMAL HIGH (ref 70–99)

## 2021-11-02 NOTE — Progress Notes (Incomplete)
PROGRESS NOTE   Subjective/Complaints:  No new issues , per SW , SNF is rec per team   ROS:  Pt denies SOB, abd pain, CP, N/V/C/D, and vision changes  Objective:   No results found. Recent Labs    10/31/21 0500  WBC 7.9  HGB 7.8*  HCT 26.5*  PLT 243     Recent Labs    10/31/21 0500  NA 138  K 4.2  CL 102  CO2 28  GLUCOSE 115*  BUN 23  CREATININE 1.24  CALCIUM 8.3*     Intake/Output Summary (Last 24 hours) at 11/02/2021 1309 Last data filed at 11/02/2021 1131 Gross per 24 hour  Intake 694 ml  Output 200 ml  Net 494 ml      Pressure Injury 10/15/21 Buttocks Right;Left Stage 2 -  Partial thickness loss of dermis presenting as a shallow open injury with a red, pink wound bed without slough. patchy areas of partial thickness skin loss related to moiture associated skin damage (Active)  10/15/21 1635  Location: Buttocks  Location Orientation: Right;Left  Staging: Stage 2 -  Partial thickness loss of dermis presenting as a shallow open injury with a red, pink wound bed without slough.  Wound Description (Comments): patchy areas of partial thickness skin loss related to moiture associated skin damage  Present on Admission: Yes    Physical Exam: Vital Signs Blood pressure (!) 162/95, pulse 71, temperature 99.7 F (37.6 C), temperature source Oral, resp. rate 14, weight (!) 157.9 kg, SpO2 93 %.   General: No acute distress Mood and affect are appropriate Heart: Regular rate and rhythm no rubs murmurs or extra sounds Lungs: Clear to auscultation, breathing unlabored, no rales or wheezes Abdomen: Positive bowel sounds, soft nontender to palpation, nondistended Extremities: No clubbing, cyanosis, or edema Skin: No evidence of breakdown, no evidence of rash   Ext: 3+ edema right pedal and pretibial, 2+ in the right dorsum of the hand and forearm Psych: pleasant and cooperative   GU: scrotal edema appears  a little better. Has modified sling on Skin:  RLE venous stasis wound, chronic skin changes. Old crusted wounds near prior amp sites Left BK in vac, with ongoing s/s drainage in cannister. Bilateral buttock wounds Neuro:  Alert and oriented x 3. Normal insight and awareness. Intact Memory. Normal language and speech. Cranial nerve exam unremarkable. Decreased sensation distal RLE. Has 4- /5 strength UE. RLE 3/5 prox to4/5 distally. No abnl tone Musculoskeletal: left stump in vac, swollen, somewhat tender    Assessment/Plan: 1. Functional deficits which require 3+ hours per day of interdisciplinary therapy in a comprehensive inpatient rehab setting. Physiatrist is providing close team supervision and 24 hour management of active medical problems listed below. Physiatrist and rehab team continue to assess barriers to discharge/monitor patient progress toward functional and medical goals  Care Tool:  Bathing    Body parts bathed by patient: Right arm, Left arm, Chest, Abdomen, Right upper leg, Left upper leg, Face   Body parts bathed by helper: Front perineal area, Buttocks, Left lower leg, Right lower leg     Bathing assist Assist Level: Total Assistance - Patient < 25%  Upper Body Dressing/Undressing Upper body dressing   What is the patient wearing?: Pull over shirt    Upper body assist Assist Level: Moderate Assistance - Patient 50 - 74%    Lower Body Dressing/Undressing Lower body dressing      What is the patient wearing?: Pants     Lower body assist Assist for lower body dressing: 2 Helpers     Toileting Toileting    Toileting assist Assist for toileting: Dependent - Patient 0%     Transfers Chair/bed transfer  Transfers assist     Chair/bed transfer assist level: Dependent - mechanical lift     Locomotion Ambulation   Ambulation assist   Ambulation activity did not occur: Safety/medical concerns          Walk 10 feet activity   Assist   Walk 10 feet activity did not occur: Safety/medical concerns        Walk 50 feet activity   Assist Walk 50 feet with 2 turns activity did not occur: Safety/medical concerns         Walk 150 feet activity   Assist Walk 150 feet activity did not occur: Safety/medical concerns         Walk 10 feet on uneven surface  activity   Assist Walk 10 feet on uneven surfaces activity did not occur: Safety/medical concerns         Wheelchair     Assist Is the patient using a wheelchair?: Yes Type of Wheelchair: Manual    Wheelchair assist level: Moderate Assistance - Patient 50 - 74% Max wheelchair distance: 150    Wheelchair 50 feet with 2 turns activity    Assist        Assist Level: Moderate Assistance - Patient 50 - 74%   Wheelchair 150 feet activity     Assist      Assist Level: Moderate Assistance - Patient 50 - 74%   Blood pressure (!) 162/95, pulse 71, temperature 99.7 F (37.6 C), temperature source Oral, resp. rate 14, weight (!) 157.9 kg, SpO2 93 %.  Medical Problem List and Plan: 1. 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  Antithrombotics: -DVT/anticoagulation:  Mechanical: Antiembolism stockings, thigh (TED hose) Right lower extremity             -antiplatelet therapy: Aspirin 81 mg daily and Plavix 75 mg daily    3. Pain Management: Hydrocodone as needed -continue gabapentin for phantom limb pain, 100mg  bid  2/12- pain controlled per pt- con't regimen  4. Mood: Provide emotional support             -antipsychotic agents: N/A 5. Neuropsych: This patient is capable of making decisions on his own behalf. 6. Skin/Wound Care: Routine skin checks.  MASD on buttocks and lymphedema/venous stasis ulcers on RLE- was told Pressure ulcer on buttocks, but not per  WOC or nursing.   WOC follow-up wound care as directed.  -  2/13 continue wound VAC until drainage tapers 7. Fluids/Electrolytes/Nutrition: Routine in and outs with follow-up chemistries 8.  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  Status post EGD and colonoscopy 10/19/2021.  Continue PPI  x8 weeks. 9.  Acute blood loss anemia. Stable 2/13 CBC Latest Ref Rng & Units 10/31/2021 10/25/2021 10/24/2021  WBC 4.0 - 10.5 K/uL 7.9 8.6 10.0  Hemoglobin 13.0 - 17.0 g/dL 7.8(L) 7.6(L) 7.6(L)  Hematocrit 39.0 - 52.0 % 26.5(L) 25.1(L) 25.1(L)  Platelets 150 - 400 K/uL 243 229 242     -fe++ supp 10.  History of CVA.  Aspirin Plavix as prior to admission. 11.  AKI.  Baseline creatinine 1.3.    -BMET/Cr are improving--now off IVF -recheck labs Monday 12.  Hypertension.  Blood pressure somewhat soft with hydralazine discontinued Toprol decreased to 25 mg daily to help perfusion for kidneys.   Vitals:   11/01/21 2031 11/02/21 0600  BP: 136/80 (!) 162/95  Pulse: 72 71  Resp: 14 14  Temp: 98.4 F (36.9 C) 99.7 F (37.6 C)  SpO2: 92% 93%  Start lasix and monitor   13.  Diabetes mellitus.  Hemoglobin A1c 7.3.  Semglee initiated while in the hospital 10 units daily.     CBG (last 3)  Recent Labs    11/01/21 2053 11/02/21 0656 11/02/21 1117  GLUCAP 119* 127* 122*     2/14 14.  Morbid obesity.  BMI 50.22.  Dietary follow-up 15.  Hyperlipidemia.  Lipitor 16.  History of gout.  Zyloprim 300 mg daily.  Monitor for any gout flareups 17.  Acute on chronic diastolic congestive heart failure.  Still with 3+ edema LE on RIght will resume lasix but at lower dose  Filed Weights   10/27/21 0434 10/28/21 0418 10/31/21 0309  Weight: (!) 161.5 kg (!) 156.5 kg (!) 157.9 kg  Last 2 readings have been fairly stable continue to monitor  18. R hand swelling- suggest K tape for R hand, elevation  2/12- ktape off- but swelling almost resolved 19.  Scrotal swelling- elevation/ towel--improving      LOS: 8 days A FACE TO FACE EVALUATION WAS PERFORMED  Charlett Blake 11/02/2021, 1:09 PM

## 2021-11-02 NOTE — Progress Notes (Signed)
Physical Therapy Weekly Progress Note  Patient Details  Name: [name] [name] MRN: [id number] Date of Birth: Jul 01, 1956  Beginning of progress report period: October 26, 2021 End of progress report period: November 02, 2021  Today's Date: 11/02/2021 PT Individual Time: 1300-1355 PT Individual Time Calculation (min): 55 min   Patient has met 1 of 3 short term goals.  Patient making very slow progress toward his goals. 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 Patient requires MaxA to roll and TotalA x2 for slideboard transfers or [facility] for safety. Patient requiring up to [place] to propel himself in the wc due to R UE weakness and body habitus.   Patient continues to demonstrate the following deficits muscle weakness, decreased cardiorespiratoy endurance, decreased problem solving and decreased safety awareness, and decreased sitting balance, decreased postural control, hemiplegia, and decreased balance strategies and therefore will continue to benefit from skilled PT intervention to increase functional independence with mobility.  Patient not progressing toward long term goals.  See goal revision..  Plan of care revisions: Goals downgraded and d/c plan changed to d/c to SNF.  PT Short Term Goals Week 1:  PT Short Term Goal 1 (Week 1): Patient will maintain static sitting balance EOB for >/= 41mns with no more than MinA PT Short Term Goal 1 - Progress (Week 1): Met PT Short Term Goal 2 (Week 1): Patient will transfer bed <> wc with LRAD and MaxA x1 PT Short Term Goal 2 - Progress (Week 1): Not met PT Short Term Goal 3 (Week 1): Patient will propel himself at least 579fwith no more than MinA PT Short Term Goal 3 - Progress (Week 1): Not met Week 2:  PT Short Term Goal 1 (Week 2): STG= LTG based on ELOS  Skilled Therapeutic Interventions/Progress Updates:    Patient received sitting up in wc, agreeable to PT. He  reports 1-2/10 pain in L RL, premedicated. PT providing rest breaks, distractions and repositioning to assist with pain management. Patient propelling himself in wc [id number] B UE and supervision. Very inefficient strokes and extended time to do so. Patient with increase in edema to R LE with crease in sock noted to be causing slight skin break down over dorsum of foot. PT cutting sock more to allow for fluctuating edema. Patient performing LAQ and marches to B LE 3x10. Patient with significant difficulty lifting R LE. Patient with multiple questions regarding SNF placement. He voiced frustration that that was the recommendation as he stated he was hoping to be able to stand and potentially walk prior to dc home. PT reviewing current level of assist and progress through therapy thus far. He requested a list of facilities- PT communicating this to SW. Patient returning to room in wc, seatbelt alarm on, call light within reach.    Therapy Documentation Precautions:  Precautions Precautions: Fall Precaution Comments: new L BKA, pressure sores on L/R bottom, 2L O2, would vac Restrictions Weight Bearing Restrictions: Yes LLE Weight Bearing: Non weight bearing    Therapy/Group: Individual Therapy  JenDebbora Dus15/2023, 7:37 AM

## 2021-11-02 NOTE — Progress Notes (Signed)
Occupational Therapy Session Note  Patient Details  Name: [name] [name] MRN: [id number] Date of Birth: 24-Jul-1956  Today's Date: 11/02/2021 OT Individual Time: 4696-2952 OT Individual Time Calculation (min): 40 min    Short Term Goals: Week 1:  OT Short Term Goal 1 (Week 1): Pt will complete BSC/toilet transfer with Mod x2 OT Short Term Goal 2 (Week 1): Pt will perform LB dress with Max A with AE PRN OT Short Term Goal 3 (Week 1): Pt will participate in task of choice/ADL for 10 minutes without rest break  Skilled Therapeutic Interventions/Progress Updates:    Pt received in bed having just finished using urinal with A from RN and getting his medications.   Pt's brief was wet from spillage with urinal.  Pt worked on rolling in bed several times for removal of brief, cleansing, application of powder due to red skin, donning of new brief, donning of shorts. He is now able to roll completely on his R side with use of bed rails and min A to L side with cues to engage torso and fully use R arm to reach.  Used maxi sky lift on ceiling carrier to transfer pt to wc. This worked much more easily as lift frame is tight for his body frame.  Replaced roho cushion with a molded cushion that was less slippery. This allowed him to be able to scoot back into wc more easily.  Pt positioned in w/c with all needs met and belt alarm on.  Call light and phone in reach.   Therapy Documentation Precautions:  Precautions Precautions: Fall Precaution Comments: new L BKA, pressure sores on L/R bottom, 2L O2, would vac Restrictions Weight Bearing Restrictions: Yes LLE Weight Bearing: Non weight bearing  Pain: Pain Assessment Pain Score: 0-No pain ADL: ADL Eating: Not assessed Grooming: Supervision/safety Upper Body Bathing: Minimal assistance Lower Body Bathing: Dependent Upper Body Dressing: Moderate assistance Lower Body Dressing: Dependent Toileting: Not assessed Toilet Transfer: Not  assessed Tub/Shower Transfer: Not assessed   Therapy/Group: Individual Therapy  Felice Hope 11/02/2021, 12:36 PM

## 2021-11-02 NOTE — Progress Notes (Signed)
Physical Therapy Session Note  Patient Details  Name: [name] [name] MRN: [id number] Date of Birth: [birth year]  Today's Date: 11/02/2021 PT Individual Time: 1447-1530 PT Individual Time Calculation (min): 43 min   Short Term Goals: Week 2:  PT Short Term Goal 1 (Week 2): STG= LTG based on ELOS  Skilled Therapeutic Interventions/Progress Updates:    Pt met asleep in trunk extended and posterior pelvic tilt in w/c. Pt slowly woken up and repositioned hips posteriorly in chair maxA+2 w/ heavy VC to shift trunk forward and extend elbows to off weight. Pt propelled himself in w/c ~47f w/ 2 L turns requiring 1 rest break in hallway. Dependent transfer outside to patio for UE strengthening, 2x15 chest press 3lb and 2x10 tricep extension w/ red theraband. Dependent transfer back to room due to time. Pt left sitting in w/c, seat belt alarm on, call bell in reach, and all needs met.  Therapy/Group: Individual Therapy  CLucile Shutters SPT  11/02/2021, 3:28 PM

## 2021-11-02 NOTE — Progress Notes (Signed)
Pt resting in bed at the moment. Left BKA with wound vac in place. Right leg new dressing placed. Patient LBM was on 10/28/21. He refused his Miralax and colace. Nurse reinforced the need for treatment but pt. Was in denial and said he only goes once a week at home and that he was not constipated.

## 2021-11-02 NOTE — Progress Notes (Signed)
Physical Therapy Session Note  Patient Details  Name: [name] [name] MRN: [id number] Date of Birth: 11-29-1955  Today's Date: 11/02/2021 PT Individual Time: 1000-1055  PT Individual Time Calculation (min): 55 min    Short Term Goals: Week 2:  PT Short Term Goal 1 (Week 2): STG= LTG based on ELOS  Skilled Therapeutic Interventions/Progress Updates:   1st session: Pt seated in w/c to start session. Reports 1/10 residual limb pain. Treatment to tolerance with rest breaks and repositioning provided for pain management. Pt slightly sliding forwards in his w/c - required maxA for repositioning and posterior scooting in w/c.   Pt then instructed in w/c propulsion - propelling himself ~12ft + ~53ft with supervision on level surfaces, using BUE to propel. Pt with difficulty maintaining straight path, bias to veer R due to R sided weakness. VC for increased effort on RUE push to keep straight path. Speed significantly decreased, <0.59m/s and difficulty achieving adequate shldr extension due to body habitus.   Completed SB transfer from w/c to mat table with +3 maxA. 3rd person stabilizing w/c for safety with 2 people assist facilitating transfer. VC/TC for maintaining forward weight shift as he has tendency to lean too far posteriorly causing hips to slide forward, limited by body habitus and global deconditioning. Required several small scoots to complete transfer.   At edge of mat, required 6inch step stool under his RLE to assist with pushing hips posteriorly for safe sitting, maxA +2.  At edge of mat, completed seated there-ex for global strengthening. 2x10 abdominal twists + 2x10 shldr press to 90 with 2kg med ball.  Completed SB transfer back to w/c with similar technique as above, using +3 maxA and similar technique as above.   He propelled himself ~43ft in w/c with improved ability to maintain straight path.  Transported remaining distance to his room for time and energy conservation.  Remained seated in w/c with safety belt alarm on, all needs within reach.    Therapy Documentation Precautions:  Precautions Precautions: Fall Precaution Comments: new L BKA, pressure sores on L/R bottom, 2L O2, would vac Restrictions Weight Bearing Restrictions: Yes LLE Weight Bearing: Non weight bearing General:    Therapy/Group: Individual Therapy  [name] P [name] PT 11/02/2021, 7:57 AM

## 2021-11-02 NOTE — Progress Notes (Signed)
Pt continues to refuse CPAP °

## 2021-11-03 LAB — GLUCOSE, CAPILLARY
Glucose-Capillary: 118 mg/dL — ABNORMAL HIGH (ref 70–99)
Glucose-Capillary: 113 mg/dL — ABNORMAL HIGH (ref 70–99)
Glucose-Capillary: 126 mg/dL — ABNORMAL HIGH (ref 70–99)
Glucose-Capillary: 130 mg/dL — ABNORMAL HIGH (ref 70–99)

## 2021-11-03 NOTE — Progress Notes (Signed)
PROGRESS NOTE   Subjective/Complaints:  Pt working with OT, 2+ assist , still with edema RUE and RLE  Wound vac output improved,   ROS:  Pt denies SOB, abd pain, CP, N/V/C/D, and vision changes  Objective:   No results found. No results for input(s): WBC, HGB, HCT, PLT in the last 72 hours.   No results for input(s): NA, K, CL, CO2, GLUCOSE, BUN, CREATININE, CALCIUM in the last 72 hours.   Intake/Output Summary (Last 24 hours) at 11/03/2021 1610 Last data filed at 11/03/2021 0700 Gross per 24 hour  Intake 820 ml  Output 600 ml  Net 220 ml      Pressure Injury 10/15/21 Buttocks Right;Left Stage 2 -  Partial thickness loss of dermis presenting as a shallow open injury with a red, pink wound bed without slough. patchy areas of partial thickness skin loss related to moiture associated skin damage (Active)  10/15/21 1635  Location: Buttocks  Location Orientation: Right;Left  Staging: Stage 2 -  Partial thickness loss of dermis presenting as a shallow open injury with a red, pink wound bed without slough.  Wound Description (Comments): patchy areas of partial thickness skin loss related to moiture associated skin damage  Present on Admission: Yes    Physical Exam: Vital Signs Blood pressure (!) 141/91, pulse 77, temperature 98.2 F (36.8 C), temperature source Oral, resp. rate 20, weight (!) 157 kg, SpO2 93 %.   General: No acute distress Mood and affect are appropriate Heart: Regular rate and rhythm no rubs murmurs or extra sounds Lungs: Clear to auscultation, breathing unlabored, no rales or wheezes Abdomen: Positive bowel sounds, soft nontender to palpation, nondistended Extremities: No clubbing, cyanosis, or edema Skin: No evidence of breakdown, no evidence of rash   Ext: 3+ edema right pedal and pretibial, 2+ in the right dorsum of the hand and forearm Psych: pleasant and cooperative   GU: scrotal edema  appears a little better. Has modified sling on Skin:  RLE venous stasis wound, chronic skin changes. Old crusted wounds near prior amp sites Left BK in vac, with ongoing s/s drainage in cannister. Bilateral buttock wounds Neuro:  Alert and oriented x 3. Normal insight and awareness. Intact Memory. Normal language and speech. Cranial nerve exam unremarkable. Decreased sensation distal RLE. Has 4- /5 strength UE. RLE 3/5 prox to4/5 distally. No abnl tone Musculoskeletal: left stump in vac, swollen, somewhat tender    Assessment/Plan: 1. Functional deficits which require 3+ hours per day of interdisciplinary therapy in a comprehensive inpatient rehab setting. Physiatrist is providing close team supervision and 24 hour management of active medical problems listed below. Physiatrist and rehab team continue to assess barriers to discharge/monitor patient progress toward functional and medical goals  Care Tool:  Bathing    Body parts bathed by patient: Right arm, Left arm, Chest, Abdomen, Right upper leg, Left upper leg, Face   Body parts bathed by helper: Front perineal area, Buttocks, Left lower leg, Right lower leg     Bathing assist Assist Level: Total Assistance - Patient < 25%     Upper Body Dressing/Undressing Upper body dressing   What is the patient wearing?: Pull over shirt  Upper body assist Assist Level: Moderate Assistance - Patient 50 - 74%    Lower Body Dressing/Undressing Lower body dressing      What is the patient wearing?: Pants     Lower body assist Assist for lower body dressing: 2 Helpers     Toileting Toileting    Toileting assist Assist for toileting: Dependent - Patient 0%     Transfers Chair/bed transfer  Transfers assist     Chair/bed transfer assist level: Dependent - mechanical lift     Locomotion Ambulation   Ambulation assist   Ambulation activity did not occur: Safety/medical concerns          Walk 10 feet  activity   Assist  Walk 10 feet activity did not occur: Safety/medical concerns        Walk 50 feet activity   Assist Walk 50 feet with 2 turns activity did not occur: Safety/medical concerns         Walk 150 feet activity   Assist Walk 150 feet activity did not occur: Safety/medical concerns         Walk 10 feet on uneven surface  activity   Assist Walk 10 feet on uneven surfaces activity did not occur: Safety/medical concerns         Wheelchair     Assist Is the patient using a wheelchair?: Yes Type of Wheelchair: Manual    Wheelchair assist level: Moderate Assistance - Patient 50 - 74% Max wheelchair distance: 150    Wheelchair 50 feet with 2 turns activity    Assist        Assist Level: Moderate Assistance - Patient 50 - 74%   Wheelchair 150 feet activity     Assist      Assist Level: Moderate Assistance - Patient 50 - 74%   Blood pressure (!) 141/91, pulse 77, temperature 98.2 F (36.8 C), temperature source Oral, resp. rate 20, weight (!) 157 kg, SpO2 93 %.  Medical Problem List and Plan: 1. 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  Antithrombotics: -DVT/anticoagulation:  Mechanical: Antiembolism stockings, thigh (TED hose) Right lower extremity             -antiplatelet therapy: Aspirin 81 mg daily and Plavix 75 mg daily    3. Pain Management: Hydrocodone as needed -continue gabapentin for phantom limb pain, 100mg  bid  2/12- pain controlled per pt- con't regimen  4. Mood: Provide emotional support             -antipsychotic agents: N/A 5. Neuropsych: This patient is capable of making decisions on his own behalf. 6. Skin/Wound Care: Routine skin checks.  MASD on buttocks and lymphedema/venous stasis ulcers on RLE- was told Pressure ulcer on  buttocks, but not per WOC or nursing.   WOC follow-up wound care as directed.  -  2/16 continue wound VAC until drainage tapers, reassess in am  7. Fluids/Electrolytes/Nutrition: Routine in and outs with follow-up chemistries 8.  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  Status post EGD and colonoscopy 10/19/2021.  Continue PPI x8 weeks. 9.  Acute blood loss anemia. Stable 2/13 CBC Latest Ref Rng &  Units 10/31/2021 10/25/2021 10/24/2021  WBC 4.0 - 10.5 K/uL 7.9 8.6 10.0  Hemoglobin 13.0 - 17.0 g/dL 7.8(L) 7.6(L) 7.6(L)  Hematocrit 39.0 - 52.0 % 26.5(L) 25.1(L) 25.1(L)  Platelets 150 - 400 K/uL 243 229 242     -fe++ supp 10.  History of CVA.  Aspirin Plavix as prior to admission. 11.  AKI.  Baseline creatinine 1.3.    -BMET/Cr are improving--now off IVF -recheck labs Monday 12.  Hypertension.  Blood pressure somewhat soft with hydralazine discontinued Toprol decreased to 25 mg daily to help perfusion for kidneys.   Vitals:   11/02/21 2004 11/03/21 0419  BP: (!) 156/84 (!) 141/91  Pulse: 63 77  Resp: 20 20  Temp: 98.5 F (36.9 C) 98.2 F (36.8 C)  SpO2: 97% 93%  Start lasix and monitor   13.  Diabetes mellitus.  Hemoglobin A1c 7.3.  Semglee initiated while in the hospital 10 units daily.     CBG (last 3)  Recent Labs    11/02/21 1723 11/02/21 2106 11/03/21 0611  GLUCAP 144* 139* 118*     2/16 14.  Morbid obesity.  BMI 50.22.  Dietary follow-up 15.  Hyperlipidemia.  Lipitor 16.  History of gout.  Zyloprim 300 mg daily.  Monitor for any gout flareups 17.  Acute on chronic diastolic congestive heart failure.  Still with 3+ edema LE on RIght will resume lasix but at lower dose  Filed Weights   10/28/21 0418 10/31/21 0309 11/03/21 0419  Weight: (!) 156.5 kg (!) 157.9 kg (!) 157 kg  Last 2 readings have been fairly stable continue to monitor  18. R hand swelling- suggest K tape for R hand, elevation  2/12- ktape  off- but swelling almost resolved 19. Scrotal swelling- elevation/ towel--improving      LOS: 9 days A FACE TO FACE EVALUATION WAS PERFORMED  Charlett Blake 11/03/2021, 9:38 AM

## 2021-11-03 NOTE — Progress Notes (Signed)
Physical Therapy Session Note  Patient Details  Name: [name] [name] MRN: [id number] Date of Birth: [birth year]  Today's Date: 11/03/2021 PT Individual Time: 1400-1445 PT Individual Time Calculation (min): 45 min   Short Term Goals: Week 2:  PT Short Term Goal 1 (Week 2): STG= LTG based on ELOS  Skilled Therapeutic Interventions/Progress Updates:      Pt supine in bed at start of session - agreeable to PT tx. Girlfriend, Cassie, at bedside. Girlfriend and patient having questions regarding recovery and ability to return to walking. Educated them both on expected length of recovery, barriers to progressions, and benefits of further rehab at [facility]. They also had questions regarding SNF, such as quality of care and quality of PT - spent time reassuring them of benefits of extended therapy. Educated them both on amputee recovery such as positioning in bed, importance of preserving terminal knee extension and hip extension, and goals for eventual prosthetic fitting. Focused remainder of session on supine there-ex in bed.  -RLE 2x10 hip abduction, heel slides  -LLE 2x10 quad sets, sidelying hip flex/ext, sidelying hip abduction *Required +2 assist to stabilize trunk and hips during sidelying exercises due to tendency to roll on his back. AAROM required for all exercises.  Pt concluded session in bed, all needs in reach, family at bedside.  Therapy Documentation Precautions:  Precautions Precautions: Fall Precaution Comments: new L BKA, pressure sores on L/R bottom, 2L O2, would vac Restrictions Weight Bearing Restrictions: Yes LLE Weight Bearing: Non weight bearing General:     Therapy/Group: Individual Therapy  Bridgitt Raggio P [name] 11/03/2021, 7:24 AM

## 2021-11-03 NOTE — Progress Notes (Signed)
Physical Therapy Session Note  Patient Details  Name: [name] [name] MRN: [id number] Date of Birth: 1955-10-22  Today's Date: 11/03/2021 PT Individual Time: 1000-1115 PT Individual Time Calculation (min): 75 min   Short Term Goals: Week 2:  PT Short Term Goal 1 (Week 2): STG= LTG based on ELOS  Skilled Therapeutic Interventions/Progress Updates:    Patient received sitting up in wc, agreeable to PT. He reports 1-2/10 pain in L RL, premedicated. PT providing rest breaks, distractions and repositioning to assist with pain management. PT transporting patient in wc to therapy gym for time management and energy conservation. Clarise Cruz Plus used to assist patient with standing and weightbearing through R LE. MaxA x2 + Clarise Cruz Plus needed to stand. He was able to tolerate standing for ~2s at a time with heavy reliance on Ues and machine. Patient found to have urinary incontinence. Transported back to room in wc. MaxiMOve used to transfer to bed for safety and efficiency. Rolling with grossly MOdA to remove brief and soiled clothing. TotalA perihygiene for urinary and bowel incontinence. Patient remaining in bed, 4 rails up, needs within reach.   Therapy Documentation Precautions:  Precautions Precautions: Fall Precaution Comments: new L BKA, pressure sores on L/R bottom, 2L O2, would vac Restrictions Weight Bearing Restrictions: Yes LLE Weight Bearing: Non weight bearing     Therapy/Group: Individual Therapy  Karoline Caldwell, PT, DPT, CBIS  11/03/2021, 7:36 AM

## 2021-11-03 NOTE — Progress Notes (Signed)
Occupational Therapy Session Note  Patient Details  Name: [name] [name] MRN: [id number] Date of Birth: Feb 27, 1956  Today's Date: 11/03/2021 OT Individual Time: 4503-8882 OT Individual Time Calculation (min): 30 min    Short Term Goals: Week 1:  OT Short Term Goal 1 (Week 1): Pt will complete BSC/toilet transfer with Mod x2 OT Short Term Goal 2 (Week 1): Pt will perform LB dress with Max A with AE PRN OT Short Term Goal 3 (Week 1): Pt will participate in task of choice/ADL for 10 minutes without rest break  Skilled Therapeutic Interventions/Progress Updates:    Pt received supine with no c/o pain. Extensive discussion re urinary urgency management. Pt wanting to use condom cath more- provided edu on UTI risk with use but encouraged him to talk to RN and MD. Pt completed rolling R with mod A and L with CGA for placement of MaxiSky sling. Overhead hoyer used to transfer pt to the w/c with +2 assist for safety. He required max +2 assist to scoot back in the chair with pt using the sink anteriorly for UE support. Pt was left sitting up with all needs met, chair alarm set. Wound vac connected to power and intact.   Therapy Documentation Precautions:  Precautions Precautions: Fall Precaution Comments: new L BKA, pressure sores on L/R bottom, 2L O2, would vac Restrictions Weight Bearing Restrictions: Yes LLE Weight Bearing: Non weight bearing  Therapy/Group: Individual Therapy  Curtis Sites 11/03/2021, 8:23 AM

## 2021-11-03 NOTE — Plan of Care (Signed)
°  Problem: RH Balance Goal: LTG: Patient will maintain dynamic sitting balance (OT) Description: LTG:  Patient will maintain dynamic sitting balance with assistance during activities of daily living (OT) Flowsheets (Taken 11/03/2021 1322) LTG: Pt will maintain dynamic sitting balance during ADLs with: (LTGs downgraded due to limited strength and ROM) Contact Guard/Touching assist Note: LTGs downgraded due to limited strength and ROM   Problem: RH Bathing Goal: LTG Patient will bathe all body parts with assist levels (OT) Description: LTG: Patient will bathe all body parts with assist levels (OT) Flowsheets (Taken 11/03/2021 1322) LTG: Pt will perform bathing with assistance level/cueing: (LTGs downgraded due to limited strength and ROM) Moderate Assistance - Patient 50 - 74% Note: LTGs downgraded due to limited strength and ROM   Problem: RH Dressing Goal: LTG Patient will perform upper body dressing (OT) Description: LTG Patient will perform upper body dressing with assist, with/without cues (OT). Flowsheets (Taken 11/03/2021 1322) LTG: Pt will perform upper body dressing with assistance level of: (LTGs downgraded due to limited strength and ROM) Minimal Assistance - Patient > 75% Note: LTGs downgraded due to limited strength and ROM Goal: LTG Patient will perform lower body dressing w/assist (OT) Description: LTG: Patient will perform lower body dressing with assist, with/without cues in positioning using equipment (OT) Flowsheets (Taken 11/03/2021 1322) LTG: Pt will perform lower body dressing with assistance level of: (LTGs downgraded due to limited strength and ROM) Maximal Assistance - Patient 25 - 49% Note: LTGs downgraded due to limited strength and ROM   Problem: RH Toileting Goal: LTG Patient will perform toileting task (3/3 steps) with assistance level (OT) Description: LTG: Patient will perform toileting task (3/3 steps) with assistance level (OT)  11/03/2021 1324 by Harlene Ramus, OT Flowsheets (Taken 11/03/2021 1324) LTG: Pt will perform toileting task (3/3 steps) with assistance level: (LTG discontinued as it is not safe for pt to be managed on a BSC at this time due difficulty with transfers, sitting balance, difficulty with lateral leans.) -- Note: LTG discontinued as it is not safe for pt to be managed on a BSC at this time due difficulty with transfers, sitting balance, difficulty with lateral leans. 11/03/2021 1322 by Harlene Ramus, OT Flowsheets (Taken 11/03/2021 1322) LTG: Pt will perform toileting task (3/3 steps) with assistance level: (LTGs downgraded due to limited strength and ROM) --   Problem: RH Toilet Transfers Goal: LTG Patient will perform toilet transfers w/assist (OT) Description: LTG: Patient will perform toilet transfers with assist, with/without cues using equipment (OT) Flowsheets (Taken 11/03/2021 1324) LTG: Pt will perform toilet transfers with assistance level of: (LTG discontinued as it is not safe for pt to be managed on a BSC at this time due difficulty with transfers, sitting balance, difficulty with lateral leans.) -- Note: LTG discontinued as it is not safe for pt to be managed on a BSC at this time due difficulty with transfers, sitting balance, difficulty with lateral leans.

## 2021-11-03 NOTE — Progress Notes (Signed)
Occupational Therapy Weekly Progress Note  Patient Details  Name: [name] [name] MRN: [id number] Date of Birth: 01/06/1956  Beginning of progress report period: October 26, 2021 End of progress report period: November 03, 2021  Today's Date: 11/03/2021 OT Individual Time: 1130-1200 OT Individual Time Calculation (min): 30 min    Patient has met 2 of 3 short term goals.  Pt is making gradual progress with some bed level self care, but he continues to need a significant amount of A with all transfers, often requiring A of 3 people.   Patient continues to demonstrate the following deficits: muscle weakness and muscle joint tightness, decreased cardiorespiratoy endurance, unbalanced muscle activation and decreased coordination, and decreased sitting balance, decreased standing balance, decreased postural control, and decreased balance strategies and therefore will continue to benefit from skilled OT intervention to enhance overall performance with BADL and Reduce care partner burden.  Patient not progressing toward long term goals.  See goal revision..  Plan of care revisions: .Marland Kitchen Problem: RH Balance Goal: LTG: Patient will maintain dynamic sitting balance (OT) Description: LTG:  Patient will maintain dynamic sitting balance with assistance during activities of daily living (OT) Flowsheets (Taken 11/03/2021 1322) LTG: Pt will maintain dynamic sitting balance during ADLs with: (LTGs downgraded due to limited strength and ROM) Contact Guard/Touching assist Note: LTGs downgraded due to limited strength and ROM   Problem: RH Bathing Goal: LTG Patient will bathe all body parts with assist levels (OT) Description: LTG: Patient will bathe all body parts with assist levels (OT) Flowsheets (Taken 11/03/2021 1322) LTG: Pt will perform bathing with assistance level/cueing: (LTGs downgraded due to limited strength and ROM) Moderate Assistance - Patient 50 - 74% Note: LTGs downgraded due to limited  strength and ROM   Problem: RH Dressing Goal: LTG Patient will perform upper body dressing (OT) Description: LTG Patient will perform upper body dressing with assist, with/without cues (OT). Flowsheets (Taken 11/03/2021 1322) LTG: Pt will perform upper body dressing with assistance level of: (LTGs downgraded due to limited strength and ROM) Minimal Assistance - Patient > 75% Note: LTGs downgraded due to limited strength and ROM Goal: LTG Patient will perform lower body dressing w/assist (OT) Description: LTG: Patient will perform lower body dressing with assist, with/without cues in positioning using equipment (OT) Flowsheets (Taken 11/03/2021 1322) LTG: Pt will perform lower body dressing with assistance level of: (LTGs downgraded due to limited strength and ROM) Maximal Assistance - Patient 25 - 49% Note: LTGs downgraded due to limited strength and ROM   Problem: RH Toileting Goal: LTG Patient will perform toileting task (3/3 steps) with assistance level (OT) Description: LTG: Patient will perform toileting task (3/3 steps) with assistance level (OT)  11/03/2021 1324 by Harlene Ramus, OT Flowsheets (Taken 11/03/2021 1324) LTG: Pt will perform toileting task (3/3 steps) with assistance level: (LTG discontinued as it is not safe for pt to be managed on a BSC at this time due difficulty with transfers, sitting balance, difficulty with lateral leans.) -- Note: LTG discontinued as it is not safe for pt to be managed on a BSC at this time due difficulty with transfers, sitting balance, difficulty with lateral leans. 11/03/2021 1322 by Harlene Ramus, OT Flowsheets (Taken 11/03/2021 1322) LTG: Pt will perform toileting task (3/3 steps) with assistance level: (LTGs downgraded due to limited strength and ROM) --   Problem: RH Toilet Transfers Goal: LTG Patient will perform toilet transfers w/assist (OT) Description: LTG: Patient will perform toilet transfers with assist, with/without cues  using  equipment (OT) Flowsheets (Taken 11/03/2021 1324) LTG: Pt will perform toilet transfers with assistance level of: (LTG discontinued as it is not safe for pt to be managed on a BSC at this time due difficulty with transfers, sitting balance, difficulty with lateral leans.) -- Note: LTG discontinued as it is not safe for pt to be managed on a BSC at this time due difficulty with transfers, sitting balance, difficulty with lateral leans  OT Short Term Goals Week 1:  OT Short Term Goal 1 (Week 1): Pt will complete BSC/toilet transfer with Mod x2 OT Short Term Goal 1 - Progress (Week 1): Not progressing OT Short Term Goal 2 (Week 1): Pt will perform LB dress with Max A with AE PRN OT Short Term Goal 2 - Progress (Week 1): Progressing toward goal OT Short Term Goal 3 (Week 1): Pt will participate in task of choice/ADL for 10 minutes without rest break OT Short Term Goal 3 - Progress (Week 1): Progressing toward goal Week 2:  OT Short Term Goal 1 (Week 2): STGs = modified LTGs  Skilled Therapeutic Interventions/Progress Updates:    Pt received in bed as he had just been transferred back to bed after a urinary incontinence episode.   Worked on pt's R hand edema with retrograde massage and coban wrapping. Educated pt on wrapping with keeping it on 24 hrs unless it gets too tight or his circulation is being impacted. Informed his RN and afternoon PT to check his hand.   Spent time working a/arom of hand and arm for increasing mobility of arm for functional use and decrease edema.  Pt then used 2# dowel bar for B sh presses.    RUE positioned on pillows to have hand resting above chest level. In bed with all needs met.   Therapy Documentation Precautions:  Precautions Precautions: Fall Precaution Comments: new L BKA, pressure sores on L/R bottom, 2L O2, would vac Restrictions Weight Bearing Restrictions: Yes LLE Weight Bearing: Non weight bearing  Pain: no c/o pain    ADL: ADL Eating:  Independent Grooming: Setup Upper Body Bathing: Setup Where Assessed-Upper Body Bathing: Bed level Lower Body Bathing: Maximal assistance Where Assessed-Lower Body Bathing: Bed level Upper Body Dressing: Minimal assistance Where Assessed-Upper Body Dressing: Bed level Lower Body Dressing: Maximal assistance Where Assessed-Lower Body Dressing: Bed level Toileting: Not assessed Toilet Transfer: Other (comment) (+ 3 with slide board, but then unable to wt shift well enough to manage clothing and cleansing) Tub/Shower Transfer: Not assessed  Therapy/Group: Individual Therapy  [place] 11/03/2021, 1:37 PM

## 2021-11-03 NOTE — Progress Notes (Signed)
Patient ID: [name] [name], male   DOB: Jul 23, 1956, 66 y.o.   MRN: [id number]  Met with pt yesterday to see if he and girlfriend and daughter discussed discharge plan from here. He reports he is agreeable to going to a SNF but wants to go to a good one where they will give him good rehab. He asked for a list and this was given to him. Discussed will need to find a facility that can accommodate his large size this may exclude some of them who do not have lifting equipment to accommodate 350 lbs. Will begin paperwork MD reports needs one week to manage his medical issues and then feels will be ready to transfer.

## 2021-11-04 LAB — GLUCOSE, CAPILLARY
Glucose-Capillary: 148 mg/dL — ABNORMAL HIGH (ref 70–99)
Glucose-Capillary: 125 mg/dL — ABNORMAL HIGH (ref 70–99)
Glucose-Capillary: 141 mg/dL — ABNORMAL HIGH (ref 70–99)
Glucose-Capillary: 165 mg/dL — ABNORMAL HIGH (ref 70–99)

## 2021-11-04 MED ORDER — TORSEMIDE 20 MG PO TABS
10.0000 mg | ORAL_TABLET | Freq: Every day | ORAL | Status: DC
  Administered 2021-11-04 – 2021-11-12 (×9): 10 mg via ORAL
  Filled 2021-11-04 (×9): qty 1

## 2021-11-04 NOTE — Progress Notes (Signed)
Occupational Therapy Session Note  Patient Details  Name: [name] [name] MRN: [id number] Date of Birth: 28-Jul-1956  Today's Date: 11/04/2021 OT Individual Time: 1035-1130 OT Individual Time Calculation (min): 55 min    Short Term Goals: Week 2:  OT Short Term Goal 1 (Week 2): STGs = modified LTGs     Skilled Therapeutic Interventions/Progress Updates:    Pt received in wc.  Seen this session to focus on B shoulder/arm exercises.  He removed coban wrap last night and his R hand has less edema. Pt worked on table top sh slides 30 x 3 and elb flex and ext 30 x3 each arm.  Resistance band for tricep ext 15 x 3 each arm, resisted rows 15 x 3, torso twists holding dowel bar.    As I was saying good bye to pt at end of session, noticed liquid on floor beneath him. Pt thought his catheter had come loose. Covered liquid with towel to prevent it from spreading.  Informed RN.  Unable to stay to help due to another pt appointment.   Therapy Documentation Precautions:  Precautions Precautions: Fall Precaution Comments: new L BKA, pressure sores on L/R bottom, 2L O2, would vac Restrictions Weight Bearing Restrictions: Yes LLE Weight Bearing: Non weight bearing    Pain: no c/o pain    ADL: ADL Eating: Independent Grooming: Setup Upper Body Bathing: Setup Where Assessed-Upper Body Bathing: Bed level Lower Body Bathing: Maximal assistance Where Assessed-Lower Body Bathing: Bed level Upper Body Dressing: Minimal assistance Where Assessed-Upper Body Dressing: Bed level Lower Body Dressing: Maximal assistance Where Assessed-Lower Body Dressing: Bed level Toileting: Not assessed Toilet Transfer: Other (comment) (+ 3 with slide board, but then unable to wt shift well enough to manage clothing and cleansing) Tub/Shower Transfer: Not assessed   Therapy/Group: Individual Therapy  [place] 11/04/2021, 8:27 AM

## 2021-11-04 NOTE — Progress Notes (Signed)
Occupational Therapy Session Note  Patient Details  Name: AYMEN WIDRIG MRN: [id number] Date of Birth: May 04, 1956  Today's Date: 11/04/2021 OT Individual Time: 1165-7903 OT Individual Time Calculation (min): 55 min    Short Term Goals: Week 2:  OT Short Term Goal 1 (Week 2): STGs = modified LTGs  Skilled Therapeutic Interventions/Progress Updates:    Pt resting in w/c upon arrival. OT intervention with focus on BUE strengthening and general conditioning.  3# bar:  Chest presses 3x10 Biceps curls 3x12 Straight arm raises 3x12  Theraband: Shoulder adduction 3x12 Punches 3x12 Triceps extension 3x12 Shoulder flexion 3x12  Pt remained in w/c with seat alarm activated and all needs within reach.   Therapy Documentation Precautions:  Precautions Precautions: Fall Precaution Comments: new L BKA, pressure sores on L/R bottom, 2L O2, would vac Restrictions Weight Bearing Restrictions: Yes LLE Weight Bearing: Non weight bearing  Pain:  Pt denies pain    Therapy/Group: Individual Therapy  Leroy Libman 11/04/2021, 9:56 AM

## 2021-11-04 NOTE — Progress Notes (Signed)
PROGRESS NOTE   Subjective/Complaints:  No issues overnite , no SOB, RLE> RUE edema persists, I/Os reviewed  ROS:  Pt denies SOB, abd pain, CP, N/V/C/D, and vision changes  Objective:   No results found. No results for input(s): WBC, HGB, HCT, PLT in the last 72 hours.   No results for input(s): NA, K, CL, CO2, GLUCOSE, BUN, CREATININE, CALCIUM in the last 72 hours.   Intake/Output Summary (Last 24 hours) at 11/04/2021 5830 Last data filed at 11/04/2021 0700 Gross per 24 hour  Intake 960 ml  Output 1375 ml  Net -415 ml      Pressure Injury 10/15/21 Buttocks Right;Left Stage 2 -  Partial thickness loss of dermis presenting as a shallow open injury with a red, pink wound bed without slough. patchy areas of partial thickness skin loss related to moiture associated skin damage (Active)  10/15/21 1635  Location: Buttocks  Location Orientation: Right;Left  Staging: Stage 2 -  Partial thickness loss of dermis presenting as a shallow open injury with a red, pink wound bed without slough.  Wound Description (Comments): patchy areas of partial thickness skin loss related to moiture associated skin damage  Present on Admission: Yes    Physical Exam: Vital Signs Blood pressure (!) 165/93, pulse 73, temperature 98.4 F (36.9 C), temperature source Oral, resp. rate 18, height 5\' 10"  (1.778 m), weight (!) 158 kg, SpO2 93 %.   General: No acute distress Mood and affect are appropriate Heart: Regular rate and rhythm no rubs murmurs or extra sounds Lungs: Clear to auscultation, breathing unlabored, no rales or wheezes Abdomen: Positive bowel sounds, soft nontender to palpation, nondistended Extremities: No clubbing, cyanosis, or edema Skin: No evidence of breakdown, no evidence of rash   Ext: 3+ edema right pedal and pretibial, 2+ in the right dorsum of the hand and forearm Psych: pleasant and cooperative   GU: scrotal edema  appears a little better. Has modified sling on Skin:  RLE venous stasis wound, chronic skin changes. Old crusted wounds near prior amp sites Left BK in vac, with ongoing s/s drainage in cannister. Bilateral buttock wounds Neuro:  Alert and oriented x 3. Normal insight and awareness. Intact Memory. Normal language and speech. Cranial nerve exam unremarkable. Decreased sensation distal RLE. Has 4- /5 strength UE. RLE 3/5 prox to4/5 distally. No abnl tone Musculoskeletal: left stump in vac, swollen, somewhat tender    Assessment/Plan: 1. Functional deficits which require 3+ hours per day of interdisciplinary therapy in a comprehensive inpatient rehab setting. Physiatrist is providing close team supervision and 24 hour management of active medical problems listed below. Physiatrist and rehab team continue to assess barriers to discharge/monitor patient progress toward functional and medical goals  Care Tool:  Bathing    Body parts bathed by patient: Right arm, Left arm, Chest, Abdomen, Right upper leg, Left upper leg, Face   Body parts bathed by helper: Front perineal area, Buttocks, Left lower leg, Right lower leg     Bathing assist Assist Level: Total Assistance - Patient < 25%     Upper Body Dressing/Undressing Upper body dressing   What is the patient wearing?: Pull over shirt  Upper body assist Assist Level: Moderate Assistance - Patient 50 - 74%    Lower Body Dressing/Undressing Lower body dressing      What is the patient wearing?: Pants     Lower body assist Assist for lower body dressing: 2 Helpers     Toileting Toileting    Toileting assist Assist for toileting: Dependent - Patient 0%     Transfers Chair/bed transfer  Transfers assist     Chair/bed transfer assist level: Dependent - mechanical lift     Locomotion Ambulation   Ambulation assist   Ambulation activity did not occur: Safety/medical concerns          Walk 10 feet  activity   Assist  Walk 10 feet activity did not occur: Safety/medical concerns        Walk 50 feet activity   Assist Walk 50 feet with 2 turns activity did not occur: Safety/medical concerns         Walk 150 feet activity   Assist Walk 150 feet activity did not occur: Safety/medical concerns         Walk 10 feet on uneven surface  activity   Assist Walk 10 feet on uneven surfaces activity did not occur: Safety/medical concerns         Wheelchair     Assist Is the patient using a wheelchair?: Yes Type of Wheelchair: Manual    Wheelchair assist level: Moderate Assistance - Patient 50 - 74% Max wheelchair distance: 150    Wheelchair 50 feet with 2 turns activity    Assist        Assist Level: Moderate Assistance - Patient 50 - 74%   Wheelchair 150 feet activity     Assist      Assist Level: Moderate Assistance - Patient 50 - 74%   Blood pressure (!) 165/93, pulse 73, temperature 98.4 F (36.9 C), temperature source Oral, resp. rate 18, height 5\' 10"  (1.778 m), weight (!) 158 kg, SpO2 93 %.  Medical Problem List and Plan: 1. 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  Antithrombotics: -DVT/anticoagulation:  Mechanical: Antiembolism stockings, thigh (TED hose) Right lower extremity             -antiplatelet therapy: Aspirin 81 mg daily and Plavix 75 mg daily    3. Pain Management: Hydrocodone as needed -continue gabapentin for phantom limb pain, 100mg  bid  2/12- pain controlled per pt- con't regimen  4. Mood: Provide emotional support             -antipsychotic agents: N/A 5. Neuropsych: This patient is capable of making decisions on his own behalf. 6. Skin/Wound Care: Routine skin checks.  MASD on buttocks and lymphedema/venous stasis ulcers on RLE-  was told Pressure ulcer on buttocks, but not per WOC or nursing.   WOC follow-up wound care as directed.  -  2/16 continue wound VAC until drainage tapers, reassess in am  7. Fluids/Electrolytes/Nutrition: Routine in and outs with follow-up chemistries 8.  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  Status post EGD and colonoscopy 10/19/2021.  Continue PPI x8 weeks. 9.  Acute blood loss anemia. Stable 2/17  CBC Latest Ref Rng & Units 10/31/2021 10/25/2021 10/24/2021  WBC 4.0 - 10.5 K/uL 7.9 8.6 10.0  Hemoglobin 13.0 - 17.0 g/dL 7.8(L) 7.6(L) 7.6(L)  Hematocrit 39.0 - 52.0 % 26.5(L) 25.1(L) 25.1(L)  Platelets 150 - 400 K/uL 243 229 242     -fe++ supp 10.  History of CVA.  Aspirin Plavix as prior to admission. 11.  AKI.  Baseline creatinine 1.3.    -BMET/Cr are improving--now off IVF -recheck labs Monday 12.  Hypertension.  Blood pressure somewhat soft with hydralazine discontinued Toprol decreased to 25 mg daily to help perfusion for kidneys.   Vitals:   11/03/21 1948 11/04/21 0333  BP: (!) 159/86 (!) 165/93  Pulse: 70 73  Resp: 20 18  Temp: 98.7 F (37.1 C) 98.4 F (36.9 C)  SpO2: 93% 93%  Start torsemide  and monitor   13.  Diabetes mellitus.  Hemoglobin A1c 7.3.  Semglee initiated while in the hospital 10 units daily.     CBG (last 3)  Recent Labs    11/03/21 1647 11/03/21 2134 11/04/21 0601  GLUCAP 126* 130* 148*     2/17 controlled  14.  Morbid obesity.  BMI 50.22.  Dietary follow-up 15.  Hyperlipidemia.  Lipitor 16.  History of gout.  Zyloprim 300 mg daily.  Monitor for any gout flareups 17.  Acute on chronic diastolic congestive heart failure.  Still with 3+ edema LE on RIght will resume lasix , Alb low at 1.8 switched to torsemide  Filed Weights   10/31/21 0309 11/03/21 0419 11/04/21 0333  Weight: (!) 157.9 kg (!) 157 kg (!) 158 kg  Last 2 readings have been fairly stable continue to monitor, expect lower  reading as diuresis continues  18. R hand swelling- suggest K tape for R hand, elevation  2/12- ktape off- but swelling almost resolved 19. Scrotal swelling- elevation/ towel--improving      LOS: 10 days A FACE TO FACE EVALUATION WAS PERFORMED  Charlett Blake 11/04/2021, 9:22 AM

## 2021-11-04 NOTE — Progress Notes (Signed)
Physical Therapy Session Note  Patient Details  Name: AZAM GERVASI MRN: [id number] Date of Birth: [birth year]  Today's Date: 11/04/2021 PT Individual Time: 0800-0830 PT Individual Time Calculation (min): 30 min   Short Term Goals: Week 2:  PT Short Term Goal 1 (Week 2): STG= LTG based on ELOS  Skilled Therapeutic Interventions/Progress Updates:    Patient received reclined in bed, agreeable to PT. He denies pain. Patient reporting bowel incontinence. Multiple bouts of rolling with MaxA and use of bed rails for TotalA perihygiene. Patient with large, hard stool. TotalA to don pants at bedlevel due to patients inability to reach pants to pull up. MaxiSky used to transfer patient to wc for safety and efficiency. TotalA to reposition in wc. Patient remaining up in wc, seatbelt alarm on, call light within reach.   Therapy Documentation Precautions:  Precautions Precautions: Fall Precaution Comments: new L BKA, pressure sores on L/R bottom, 2L O2, would vac Restrictions Weight Bearing Restrictions: Yes LLE Weight Bearing: Non weight bearing     Therapy/Group: Individual Therapy  Karoline Caldwell, PT, DPT, CBIS  11/04/2021, 7:34 AM

## 2021-11-05 LAB — GLUCOSE, CAPILLARY
Glucose-Capillary: 129 mg/dL — ABNORMAL HIGH (ref 70–99)
Glucose-Capillary: 136 mg/dL — ABNORMAL HIGH (ref 70–99)
Glucose-Capillary: 161 mg/dL — ABNORMAL HIGH (ref 70–99)
Glucose-Capillary: 136 mg/dL — ABNORMAL HIGH (ref 70–99)

## 2021-11-05 MED ORDER — POTASSIUM CHLORIDE CRYS ER 10 MEQ PO TBCR
10.0000 meq | EXTENDED_RELEASE_TABLET | Freq: Every day | ORAL | Status: DC
  Administered 2021-11-05 – 2021-11-17 (×13): 10 meq via ORAL
  Filled 2021-11-05 (×13): qty 1

## 2021-11-05 NOTE — Progress Notes (Signed)
Physical Therapy Session Note  Patient Details  Name: [name] [name] MRN: [id number] Date of Birth: September 17, 1956   Short Term Goals: Week 2:  PT Short Term Goal 1 (Week 2): STG= LTG based on ELOS  Skilled Therapeutic Interventions/Progress Updates:     Pt received seated in [facility] and agrees to therapy. No complaint of pain but reportedly pt's condom catheter had dislodged and pt needed to be assisted with cleaning. WC to bed transfer with dependent assist via Maxi sky, with PT providing cues to pt on positioning. Pt then performs bilateral rolling with modA and cues for hand placement, logrolling technique, and sequencing. Pt noted to have been incontinent of bowel as well, and is able to remain in L sidelying with modA as PT performs pericare. Clean brief donned and pt perform bilateral rolling several more times to assist. Left supine with alarm intact and all needs within reach.  Therapy Documentation Precautions:  Precautions Precautions: Fall Precaution Comments: new L BKA, pressure sores on L/R bottom, 2L O2, would vac Restrictions Weight Bearing Restrictions: Yes LLE Weight Bearing: Non weight bearing    Therapy/Group: Individual Therapy  Breck Coons, PT, DPT 11/05/2021, 2:55 PM

## 2021-11-05 NOTE — Progress Notes (Signed)
PROGRESS NOTE   Subjective/Complaints:  No issues overnite , patient feels like he has been urinating a lot.  He states that his wound VAC has not been emptied for a few days.  Total out is about 250 mL  ROS:  Pt denies SOB, abd pain, CP, N/V/C/D, and vision changes  Objective:   No results found. No results for input(s): WBC, HGB, HCT, PLT in the last 72 hours.   No results for input(s): NA, K, CL, CO2, GLUCOSE, BUN, CREATININE, CALCIUM in the last 72 hours.   Intake/Output Summary (Last 24 hours) at 11/05/2021 1054 Last data filed at 11/05/2021 0848 Gross per 24 hour  Intake 920 ml  Output 1800 ml  Net -880 ml      Pressure Injury 10/15/21 Buttocks Right;Left Stage 2 -  Partial thickness loss of dermis presenting as a shallow open injury with a red, pink wound bed without slough. patchy areas of partial thickness skin loss related to moiture associated skin damage (Active)  10/15/21 1635  Location: Buttocks  Location Orientation: Right;Left  Staging: Stage 2 -  Partial thickness loss of dermis presenting as a shallow open injury with a red, pink wound bed without slough.  Wound Description (Comments): patchy areas of partial thickness skin loss related to moiture associated skin damage  Present on Admission: Yes    Physical Exam: Vital Signs Blood pressure (!) 144/88, pulse 67, temperature 98 F (36.7 C), temperature source Oral, resp. rate 17, height 5\' 10"  (1.778 m), weight (!) 157 kg, SpO2 94 %.   General: No acute distress Mood and affect are appropriate Heart: Regular rate and rhythm no rubs murmurs or extra sounds Lungs: Clear to auscultation, breathing unlabored, no rales or wheezes Abdomen: Positive bowel sounds, soft nontender to palpation, nondistended Extremities: No clubbing, cyanosis, or edema Skin: No evidence of breakdown, no evidence of rash   Ext: 3+ edema right pedal and pretibial, 1+ in the  right dorsum of the hand and forearm Psych: pleasant and cooperative   GU: scrotal edema appears a little better. Has modified sling on Skin:  RLE venous stasis wound, chronic skin changes. Old crusted wounds near prior amp sites Left BK in vac, with ongoing s/s drainage in cannister. Bilateral buttock wounds Neuro:  Alert and oriented x 3. Normal insight and awareness. Intact Memory. Normal language and speech. Cranial nerve exam unremarkable. Decreased sensation distal RLE. Has 4- /5 strength UE. RLE 3/5 prox to4/5 distally. No abnl tone Musculoskeletal: left stump in vac, swollen, somewhat tender    Assessment/Plan: 1. Functional deficits which require 3+ hours per day of interdisciplinary therapy in a comprehensive inpatient rehab setting. Physiatrist is providing close team supervision and 24 hour management of active medical problems listed below. Physiatrist and rehab team continue to assess barriers to discharge/monitor patient progress toward functional and medical goals  Care Tool:  Bathing    Body parts bathed by patient: Right arm, Left arm, Chest, Abdomen, Right upper leg, Left upper leg, Face   Body parts bathed by helper: Front perineal area, Buttocks, Left lower leg, Right lower leg     Bathing assist Assist Level: Total Assistance - Patient < 25%  Upper Body Dressing/Undressing Upper body dressing   What is the patient wearing?: Pull over shirt    Upper body assist Assist Level: Moderate Assistance - Patient 50 - 74%    Lower Body Dressing/Undressing Lower body dressing      What is the patient wearing?: Pants     Lower body assist Assist for lower body dressing: 2 Helpers     Toileting Toileting    Toileting assist Assist for toileting: Dependent - Patient 0%     Transfers Chair/bed transfer  Transfers assist     Chair/bed transfer assist level: Dependent - mechanical lift     Locomotion Ambulation   Ambulation assist   Ambulation  activity did not occur: Safety/medical concerns          Walk 10 feet activity   Assist  Walk 10 feet activity did not occur: Safety/medical concerns        Walk 50 feet activity   Assist Walk 50 feet with 2 turns activity did not occur: Safety/medical concerns         Walk 150 feet activity   Assist Walk 150 feet activity did not occur: Safety/medical concerns         Walk 10 feet on uneven surface  activity   Assist Walk 10 feet on uneven surfaces activity did not occur: Safety/medical concerns         Wheelchair     Assist Is the patient using a wheelchair?: Yes Type of Wheelchair: Manual    Wheelchair assist level: Moderate Assistance - Patient 50 - 74% Max wheelchair distance: 150    Wheelchair 50 feet with 2 turns activity    Assist        Assist Level: Moderate Assistance - Patient 50 - 74%   Wheelchair 150 feet activity     Assist      Assist Level: Moderate Assistance - Patient 50 - 74%   Blood pressure (!) 144/88, pulse 67, temperature 98 F (36.7 C), temperature source Oral, resp. rate 17, height 5\' 10"  (1.778 m), weight (!) 157 kg, SpO2 94 %.  Medical Problem List and Plan: 1. 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  Antithrombotics: -DVT/anticoagulation:  Mechanical: Antiembolism stockings, thigh (TED hose) Right lower extremity             -antiplatelet therapy: Aspirin 81 mg daily and Plavix 75 mg daily    3. Pain Management: Hydrocodone as needed -continue gabapentin for phantom limb pain, 100mg  bid  2/12- pain controlled per pt- con't regimen  4. Mood: Provide emotional support             -antipsychotic agents: N/A 5. Neuropsych: This patient is capable of making decisions on his own behalf. 6. Skin/Wound Care: Routine  skin checks.  MASD on buttocks and lymphedema/venous stasis ulcers on RLE- was told Pressure ulcer on buttocks, but not per WOC or nursing.   WOC follow-up wound care as directed.  -  2/16 continue wound VAC until drainage tapers, reassess in am  7. Fluids/Electrolytes/Nutrition: Routine in and outs with follow-up chemistries 8.  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  Status post EGD and colonoscopy 10/19/2021.  Continue PPI x8 weeks. 9.  Acute blood loss anemia. Stable 2/17 CBC Latest Ref Rng & Units 10/31/2021 10/25/2021 10/24/2021  WBC 4.0 - 10.5 K/uL 7.9 8.6 10.0  Hemoglobin 13.0 - 17.0 g/dL 7.8(L) 7.6(L) 7.6(L)  Hematocrit 39.0 - 52.0 % 26.5(L) 25.1(L) 25.1(L)  Platelets 150 - 400 K/uL 243 229 242     -fe++ supp 10.  History of CVA.  Aspirin Plavix as prior to admission. 11.  AKI.  Baseline creatinine 1.3.    -BMET/Cr are improving--now off IVF Given diuresis we will recheck basic metabolic panel in the morning 12.  Hypertension.  Blood pressure somewhat soft with hydralazine discontinued Toprol decreased to 25 mg daily to help perfusion for kidneys.   Vitals:   11/04/21 2020 11/05/21 0327  BP: 140/79 (!) 144/88  Pulse: 72 67  Resp: 16 17  Temp: 98.5 F (36.9 C) 98 F (36.7 C)  SpO2: 93% 94%  Start torsemide  and monitor, blood pressure improved with diuretic  13.  Diabetes mellitus.  Hemoglobin A1c 7.3.  Semglee initiated while in the hospital 10 units daily.     CBG (last 3)  Recent Labs    11/04/21 1635 11/04/21 2109 11/05/21 0612  GLUCAP 141* 165* 129*     2/18 controlled  14.  Morbid obesity.  BMI 50.22.  Dietary follow-up 15.  Hyperlipidemia.  Lipitor 16.  History of gout.  Zyloprim 300 mg daily.  Monitor for any gout flareups 17.  Acute on chronic diastolic congestive heart failure.  Still with 3+ edema LE on RIght will resume lasix , Alb low at 1.8 switched to torsemide  Filed Weights   11/03/21  0419 11/04/21 0333 11/05/21 0327  Weight: (!) 157 kg (!) 158 kg (!) 157 kg  Last 2 readings have been fairly stable continue to monitor, expect lower reading as diuresis continues, may have some downward trend over the next several days with continued diuresis.  18. R hand swelling- suggest K tape for R hand, elevation  2/12- ktape off- but swelling almost resolved 19. Scrotal swelling- elevation/ towel--improving      LOS: 11 days A FACE TO FACE EVALUATION WAS PERFORMED  Charlett Blake 11/05/2021, 10:54 AM

## 2021-11-06 LAB — BASIC METABOLIC PANEL
Anion gap: 8 (ref 5–15)
BUN: 22 mg/dL (ref 8–23)
CO2: 31 mmol/L (ref 22–32)
Calcium: 8.1 mg/dL — ABNORMAL LOW (ref 8.9–10.3)
Chloride: 98 mmol/L (ref 98–111)
Creatinine, Ser: 1.06 mg/dL (ref 0.61–1.24)
GFR, Estimated: >60 mL/min (ref >60)
Glucose, Bld: 124 mg/dL — ABNORMAL HIGH (ref 70–99)
Potassium: 3.8 mmol/L (ref 3.5–5.1)
Sodium: 137 mmol/L (ref 135–145)

## 2021-11-06 LAB — GLUCOSE, CAPILLARY
Glucose-Capillary: 120 mg/dL — ABNORMAL HIGH (ref 70–99)
Glucose-Capillary: 140 mg/dL — ABNORMAL HIGH (ref 70–99)
Glucose-Capillary: 155 mg/dL — ABNORMAL HIGH (ref 70–99)
Glucose-Capillary: 169 mg/dL — ABNORMAL HIGH (ref 70–99)

## 2021-11-06 MED ORDER — GERHARDT'S BUTT CREAM
TOPICAL_CREAM | Freq: Three times a day (TID) | CUTANEOUS | Status: DC
  Filled 2021-11-06: qty 1

## 2021-11-06 NOTE — Progress Notes (Signed)
PROGRESS NOTE   Subjective/Complaints: Reduced drainage on wound VAC, discussed right lower extremity severe edema, patient states he has dealt with this for a long time.  Has tried Ace wrap's which seem to make it worse.  He does have a sequential pump that goes from his foot to his thigh at home but was unable to put this on.  ROS:  Pt denies SOB, abd pain, CP, N/V/C/D, and vision changes  Objective:   No results found. No results for input(s): WBC, HGB, HCT, PLT in the last 72 hours.   Recent Labs    11/06/21 0553  NA 137  K 3.8  CL 98  CO2 31  GLUCOSE 124*  BUN 22  CREATININE 1.06  CALCIUM 8.1*     Intake/Output Summary (Last 24 hours) at 11/06/2021 1250 Last data filed at 11/06/2021 0749 Gross per 24 hour  Intake 1560 ml  Output 4350 ml  Net -2790 ml      Pressure Injury 10/15/21 Buttocks Right;Left Stage 2 -  Partial thickness loss of dermis presenting as a shallow open injury with a red, pink wound bed without slough. patchy areas of partial thickness skin loss related to moiture associated skin damage (Active)  10/15/21 1635  Location: Buttocks  Location Orientation: Right;Left  Staging: Stage 2 -  Partial thickness loss of dermis presenting as a shallow open injury with a red, pink wound bed without slough.  Wound Description (Comments): patchy areas of partial thickness skin loss related to moiture associated skin damage  Present on Admission: Yes    Physical Exam: Vital Signs Blood pressure (!) 151/87, pulse 65, temperature 98.2 F (36.8 C), temperature source Oral, resp. rate 17, height 5\' 10"  (1.778 m), weight (!) 157.9 kg, SpO2 94 %.   General: No acute distress Mood and affect are appropriate Heart: Regular rate and rhythm no rubs murmurs or extra sounds Lungs: Clear to auscultation, breathing unlabored, no rales or wheezes Abdomen: Positive bowel sounds, soft nontender to palpation,  nondistended Extremities: No clubbing, cyanosis, or edema Skin: No evidence of breakdown, no evidence of rash   Ext: 3+ edema right pedal and pretibial, 1+ in the right dorsum of the hand and forearm Psych: pleasant and cooperative   GU: scrotal edema appears a little better. Has modified sling on Skin:  RLE venous stasis wound, chronic skin changes. Old crusted wounds near prior amp sites Left BK in vac, with ongoing s/s drainage in cannister. Bilateral buttock wounds Neuro:  Alert and oriented x 3. Normal insight and awareness. Intact Memory. Normal language and speech. Cranial nerve exam unremarkable. Decreased sensation distal RLE.  Normal strength left l upper extremity has 4- /5 strength UE. RLE 3/5 prox to4/5 distally. No abnl tone Musculoskeletal: left stump in vac, swollen, somewhat tender    Assessment/Plan: 1. Functional deficits which require 3+ hours per day of interdisciplinary therapy in a comprehensive inpatient rehab setting. Physiatrist is providing close team supervision and 24 hour management of active medical problems listed below. Physiatrist and rehab team continue to assess barriers to discharge/monitor patient progress toward functional and medical goals  Care Tool:  Bathing    Body parts bathed by patient:  Right arm, Left arm, Chest, Abdomen, Right upper leg, Left upper leg, Face   Body parts bathed by helper: Front perineal area, Buttocks, Left lower leg, Right lower leg     Bathing assist Assist Level: Total Assistance - Patient < 25%     Upper Body Dressing/Undressing Upper body dressing   What is the patient wearing?: Pull over shirt    Upper body assist Assist Level: Moderate Assistance - Patient 50 - 74%    Lower Body Dressing/Undressing Lower body dressing      What is the patient wearing?: Pants     Lower body assist Assist for lower body dressing: 2 Helpers     Toileting Toileting    Toileting assist Assist for toileting: Dependent -  Patient 0%     Transfers Chair/bed transfer  Transfers assist     Chair/bed transfer assist level: Dependent - mechanical lift     Locomotion Ambulation   Ambulation assist   Ambulation activity did not occur: Safety/medical concerns          Walk 10 feet activity   Assist  Walk 10 feet activity did not occur: Safety/medical concerns        Walk 50 feet activity   Assist Walk 50 feet with 2 turns activity did not occur: Safety/medical concerns         Walk 150 feet activity   Assist Walk 150 feet activity did not occur: Safety/medical concerns         Walk 10 feet on uneven surface  activity   Assist Walk 10 feet on uneven surfaces activity did not occur: Safety/medical concerns         Wheelchair     Assist Is the patient using a wheelchair?: Yes Type of Wheelchair: Manual    Wheelchair assist level: Moderate Assistance - Patient 50 - 74% Max wheelchair distance: 150    Wheelchair 50 feet with 2 turns activity    Assist        Assist Level: Moderate Assistance - Patient 50 - 74%   Wheelchair 150 feet activity     Assist      Assist Level: Moderate Assistance - Patient 50 - 74%   Blood pressure (!) 151/87, pulse 65, temperature 98.2 F (36.8 C), temperature source Oral, resp. rate 17, height 5\' 10"  (1.778 m), weight (!) 157.9 kg, SpO2 94 %.  Medical Problem List and Plan: 1. 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  Antithrombotics: -DVT/anticoagulation:  Mechanical: Antiembolism stockings, thigh (TED hose) Right lower extremity             -antiplatelet therapy: Aspirin 81 mg daily and Plavix 75 mg daily    3. Pain Management: Hydrocodone as needed -continue gabapentin for phantom limb pain, 100mg  bid  2/12- pain controlled per  pt- con't regimen  4. Mood: Provide emotional support             -antipsychotic agents: N/A 5. Neuropsych: This patient is capable of making decisions on his own behalf. 6. Skin/Wound Care: Routine skin checks.  MASD on buttocks and lymphedema/venous stasis ulcers on RLE- was told Pressure ulcer on buttocks, but not per WOC or nursing.  WOC follow-up wound care as directed.  -  2/19 continue wound VAC until drainage tapers, would touch base with Ortho in regards to this prior to discontinuation 7. Fluids/Electrolytes/Nutrition: Routine in and outs with follow-up chemistries 8.  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  Status post EGD and colonoscopy 10/19/2021.  Continue PPI x8 weeks. 9.  Acute blood loss anemia. Stable 2/17 CBC Latest Ref Rng & Units 10/31/2021 10/25/2021 10/24/2021  WBC 4.0 - 10.5 K/uL 7.9 8.6 10.0  Hemoglobin 13.0 - 17.0 g/dL 7.8(L) 7.6(L) 7.6(L)  Hematocrit 39.0 - 52.0 % 26.5(L) 25.1(L) 25.1(L)  Platelets 150 - 400 K/uL 243 229 242     -fe++ supp 10.  History of CVA.  Aspirin Plavix as prior to admission. 11.  AKI.  Baseline creatinine 1.3.    -BMET/Cr are improving--now off IVF Given diuresis we will recheck basic metabolic panel in the morning 12.  Hypertension.  Blood pressure somewhat soft with hydralazine discontinued Toprol decreased to 25 mg daily to help perfusion for kidneys.   Vitals:   11/06/21 0322 11/06/21 0336  BP: (!) 151/87   Pulse: 65   Resp: 17   Temp: 98.2 F (36.8 C)   SpO2: 90% 94%  Start torsemide  and monitor, blood pressure improved with diuretic  13.  Diabetes mellitus.  Hemoglobin A1c 7.3.  Semglee initiated while in the hospital 10 units daily.     CBG (last 3)  Recent Labs    11/05/21 2132 11/06/21 0602 11/06/21 1213  GLUCAP 136* 120* 140*     2/19 controlled  14.  Morbid obesity.  BMI 50.22.  Dietary follow-up 15.  Hyperlipidemia.  Lipitor 16.  History of  gout.  Zyloprim 300 mg daily.  Monitor for any gout flareups 17.  Acute on chronic diastolic congestive heart failure.  Still with 3+ edema LE on RIght and 4+ in the right pedal, will order foot pump , Alb low at 1.8 switched to torsemide  Filed Weights   11/04/21 0333 11/05/21 0327 11/06/21 0322  Weight: (!) 158 kg (!) 157 kg (!) 157.9 kg  Last 2 readings have been fairly stable continue to monitor, expect lower reading as diuresis continues, may have some downward trend over the next several days with continued diuresis.  18. R hand swelling- suggest K tape for R hand, elevation  2/12- ktape off- but swelling almost resolved 19. Scrotal swelling- elevation/ towel--improving      LOS: 12 days A FACE TO FACE EVALUATION WAS PERFORMED  Charlett Blake 11/06/2021, 12:50 PM

## 2021-11-06 NOTE — Progress Notes (Signed)
Physical Therapy Session Note  Patient Details  Name: [name] [name] MRN: [id number] Date of Birth: Jun 29, 1956  Today's Date: 11/06/2021 PT Individual Time: 1515-1610 PT Individual Time Calculation (min): 55 min   Short Term Goals: Week 2:  PT Short Term Goal 1 (Week 2): STG= LTG based on ELOS  Skilled Therapeutic Interventions/Progress Updates:    Pt received seated in bed, agreeable to PT session. No complaints of pain. Pt reports he has not been out of bed all weekend, agreeable to get up to w/c. Pt is dependent to don sock on R foot and pants at bed level. Rolling L/R with mod A and use of bedrail for pulling pants up over hips and for placement of maxi sky sling. Maxi sky transfer bed to w/c. Dependent transport via w/c to/from therapy gym for time and energy conservation. Seated core strengthening with 3# weighted dowel: volleyball x 30 reps to fatigue; ball toss x 30 reps to fatigue. Pt agreeable to remain seated in w/c at end of session, needs in reach, family present.  Therapy Documentation Precautions:  Precautions Precautions: Fall Precaution Comments: new L BKA, pressure sores on L/R bottom, 2L O2, would vac Restrictions Weight Bearing Restrictions: Yes LLE Weight Bearing: Non weight bearing     Therapy/Group: Individual Therapy   Excell Seltzer, PT, DPT, CSRS  11/06/2021, 4:50 PM

## 2021-11-07 LAB — GLUCOSE, CAPILLARY
Glucose-Capillary: 139 mg/dL — ABNORMAL HIGH (ref 70–99)
Glucose-Capillary: 132 mg/dL — ABNORMAL HIGH (ref 70–99)
Glucose-Capillary: 123 mg/dL — ABNORMAL HIGH (ref 70–99)
Glucose-Capillary: 133 mg/dL — ABNORMAL HIGH (ref 70–99)

## 2021-11-07 NOTE — Progress Notes (Signed)
Occupational Therapy Session Note  Patient Details  Name: [name] MRN: [id number] Date of Birth: March 10, 1956  Today's Date: 11/07/2021 OT Group Time: 1100-1200 OT Group Time Calculation (min): 60 min   Short Term Goals: Week 2:  OT Short Term Goal 1 (Week 2): STGs = modified LTGs  Skilled Therapeutic Interventions/Progress Updates Pt participated in group session with a focus on therapeutic activity of Bowling to facilitate magagement of w/c, hand-eye- coordination, UB strength, social interaction, and increasing activity tolerance. Pt completed bowling turns from sitting in w/c with RUE, pt needed total A to position w/c but able to lock brakes independently. Pt with no c/o pain during session, pt actively interacting with other group members by  providing encouragement to other group members. Pt transported back to room by RT.    Therapy Documentation Precautions:  Precautions Precautions: Fall Precaution Comments: new L BKA, pressure sores on L/R bottom, 2L O2, would vac Restrictions Weight Bearing Restrictions: Yes LLE Weight Bearing: Non weight bearing  Pain: no pain reported during session     Therapy/Group: Group Therapy  Precious Haws 11/07/2021, 12:36 PM

## 2021-11-07 NOTE — Progress Notes (Signed)
Physical Therapy Session Note  Patient Details  Name: [name] [name] MRN: [id number] Date of Birth: 04-12-1956  Today's Date: 11/07/2021 PT Individual Time: 1500-1545 PT Individual Time Calculation (min): 45 min  and Today's Date: 11/07/2021 PT Missed Time:   Missed Time Reason: Other (Comment)  Short Term Goals: Week 2:  PT Short Term Goal 1 (Week 2): STG= LTG based on ELOS  Skilled Therapeutic Interventions/Progress Updates:    Patient received sitting up in wc, agreeable to PT. He denies pain. Patient requesting to practice standing again. Donned SaraPlus sling. Patient coming to stand with SaraPlus + MaxA. Able to remain standing for ~5s before needing to sit. After ~65min rest break, patient standing again. Upon standing, patient pushing back on Saraplus resulting in it moving out from under him. Patients bottom hitting edge of wc resulting in wc moving backward out from under him. Patient still in [place] when PT and tech attempting to lift patient back onto wc, but wc armrest came off. Due to patient size and L BKA with R hemiparesis- unable to successfully recover. Patient very slowly lowered to the floor. Distress alarm activated and Rns responding. MaxiSky used to lift patient back into bed. Patient did not hit his RL nor his head. 4 rails up, call light within reach, RN aware of patients location.   Therapy Documentation Precautions:  Precautions Precautions: Fall Precaution Comments: new L BKA, pressure sores on L/R bottom, 2L O2, would vac Restrictions Weight Bearing Restrictions: Yes LLE Weight Bearing: Non weight bearing    Therapy/Group: Individual Therapy  Karoline Caldwell, PT, DPT, CBIS  11/07/2021, 7:36 AM

## 2021-11-07 NOTE — Consult Note (Signed)
Neuropsychological Consultation   Patient:   [name] [name]   DOB:   October 10, 1955  MR Number:  [id number]  Location:  [facility] [id number] [place] [address] Dept: [place]: [phone]           Date of Service:   11/07/2021  Start Time:   10 AM End Time:   11 AM  Provider/Observer:  Ilean Skill, Psy.D.       Clinical Neuropsychologist       Billing Code/Service: [id number]  Chief Complaint:    [name] [name] is a 66 year old right-handed male with a past medical history including CVA and roughly 2005 with residual right-sided weakness particularly with his right arm.  The patient plays the piano and has been able to continue to play the piano.  The patient is also been diagnosed with asbestosis, hypertension, type 2 diabetes, OSA without CPAP use, obesity, diastolic congestive heart failure, chronic venous stasis and lymphedema of bilateral lower extremities.  Patient has been followed by wound care.  Patient presented on 10/14/2020 with persistent left ankle pain after he twisted his left ankle getting into his car approximately 1 month prior.  There was no fracture identified.  Patient did have extensive edema venous stasis ulceration with purulent drainage and necrotic ulcers of the left lower extremity.  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  Septic arthritis and other issues noted with the joint.  Diffuse soft tissue swelling and subcutaneous edema.  Patient followed by Dr. 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  Reason for Service:  Patient referred for neuropsychological consultation due to coping and adjustment with recent left BKA in a setting with multiple medical issues.  Below is the HPI for the current admission.  HPI: [name] [name] is a 66 year old right-handed male with history of CVA  maintained on aspirin and Plavix, asbestosis, hypertension, type 2 diabetes mellitus, OSA not on CPAP, obesity with BMI 96.78, diastolic congestive heart failure, chronic venous stasis and lymphedema of bilateral lower extremities followed by wound care.  Per chart review patient lives alone.  He does have a personal care attendant as well as good support from his daughter and sister.  PCA assist with ADLs.  Patient was using a cane for short distances but mostly wheelchair-bound.  1 level home with ramped entrance.  Presented 10/14/2021 with persistent left ankle pain since he twisted his ankle getting into his car approximately 1 month ago.  He was initially referred to [facility] x-rays negative for fracture.  Patient notes extensive edema venous stasis ulceration with purulent drainage and necrotic ulcers of left lower extremity.  Admission chemistries glucose 151 BUN 71 creatinine 1.74, WBC 12,000 hemoglobin 9.4 down from recent 13.3, lactic acid 1.4 FOBT positive.  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  Septic arthritis of the tibiotalar, subtalar and talonavicular joints.  Diffuse soft tissue swelling and subcutaneous edema.  Gastroenterology services consulted for rectal bleeding FOBT positive.  Patient underwent colonoscopy/EGD 10/19/2021 per Dr. Lorenso [name] that examined portion of ileum was normal there was a single solitary ulcer at the ileocecal valve.  Seven 3 to 7 mm polyps in the sigmoid colon and descending colon removed with a cold snare.  Nonbleeding internal hemorrhoids.  Patient was placed on PPI twice daily x8 weeks.  Patient's  aspirin and Plavix initially held and has since been resumed 10/22/2021 with latest hemoglobin 7.9.  Follow-up orthopedic services Dr. Sharol Given in regards to osteomyelitis left ankle limb was not felt to be salvageable underwent left BKA 10/21/2021 and wound VAC applied.WOC follow-up consult for multiple chronic wounds to bilateral legs  as well as pressure ulceration maceration bilateral buttocks with wound care as directed.  Noted AKI baseline serum creatinine around 1.3 presentation 1.74-1.98-2.37-2.08 and did receive IV fluids x10 hours and monitored.  Blood pressure was somewhat soft hydralazine discontinued Toprol reduced to 50 mg to allow better perfusion for kidneys.  Therapy evaluations completed and due to patient decreased functional mobility was admitted for a comprehensive rehab program.  Current Status:  Patient was awake and alert sitting in his wheelchair.  Patient showed adequate cognition with good mental status and aware of what has been happening to him recently from a medical standpoint.  Patient reports that his wife passed away not long ago but that he has been continuing to try to maintain his current life functioning.  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  The patient denies any significant depression or anxiety and reports that he has been coping adequately with his wife's passing.  Patient reports that he has a good bit of apprehension knowing that once he leaves CIR that he will be going to a skilled nursing facility to complete his rehabilitation efforts prior to being discharged home.  Patient reports that his wife had been in for separate skilled nursing facilities and they have not always been up to what he felt was appropriate and he is apprehensive as to what to expect himself for his care.  Behavioral Observation: [name] [name]  presents as a 66 y.o.-year-old Right handed Caucasian Male who appeared his stated age. his dress was Appropriate and he was Well Groomed and his manners were Appropriate to the situation.  his participation was indicative of Appropriate and Attentive behaviors.  There were physical disabilities noted.  he displayed an appropriate level of  cooperation and motivation.     Interactions:    Active Appropriate  Attention:   within normal limits and attention span and concentration were age appropriate  Memory:   within normal limits; recent and remote memory intact  Visuo-spatial:  not examined  Speech (Volume):  normal  Speech:   normal; normal  Thought Process:  Coherent and Relevant  Though Content:  WNL; not suicidal and not homicidal  Orientation:   person, place, time/date, and situation  Judgment:   Good  Planning:   Fair  Affect:    Anxious  Mood:    Anxious  Insight:   Good  Intelligence:   normal  Medical History:   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[place])    Essential hypertension    History of nephrolithiasis    History of open leg wound    History of stroke July 2004   Obstructive sleep apnea    TIA (transient ischemic attack)    Varicose veins    Laser ablation of left great saphenous vein 2009         Patient Active Problem List   Diagnosis Date Noted   Left below-knee amputee ([place]) 10/25/2021   Left leg pain    Osteomyelitis of ankle and foot ([place]) 10/15/2021  Anemia 10/15/2021   Pressure ulcer 10/15/2021   History of CVA (cerebrovascular accident) 10/15/2021   Septic arthritis of ankle and foot region ([place]) 10/15/2021   AKI (acute kidney injury) ([place]) 10/14/2021   Pneumonia due to COVID-19 virus 09/09/2019   Hypokalemia 09/09/2019   Acute respiratory failure with hypoxia ([place]) 09/09/2019   Essential hypertension 10/14/2014   Chronic diastolic heart failure ([place]) 10/14/2014   Morbid obesity ([place]) 07/22/2013   Varicose veins of lower extremities with other complications 16/06/9603   OSA (obstructive sleep apnea) 07/22/2013   Venous stasis ulcers ([place]) 07/22/2013   Diabetes mellitus ([place]) 01/03/2011             Abuse/Trauma History: Patient did not describe any prior PTSD or other traumatic experiences but does admit that it was difficult especially  initially coping with his wife's illness and her ultimately passing and skilled nursing facility.  Psychiatric History:  No prior psychiatric history  Family Med/Psych History:  Family History  Problem Relation Age of Onset   Bone cancer Mother    Hypertension Father    Pancreatic cancer Maternal Grandmother    Stroke Paternal Grandmother    Diabetes Paternal Grandfather    Heart attack Brother    Hypertension Brother    Heart attack Sister     Impression/DX:  [name] [name] is a 66 year old right-handed male with a past medical history including CVA and roughly 2005 with residual right-sided weakness particularly with his right arm.  The patient plays the piano and has been able to continue to play the piano.  The patient is also been diagnosed with asbestosis, hypertension, type 2 diabetes, OSA without CPAP use, obesity, diastolic congestive heart failure, chronic venous stasis and lymphedema of bilateral lower extremities.  Patient has been followed by wound care.  Patient presented on 10/14/2020 with persistent left ankle pain after he twisted his left ankle getting into his car approximately 1 month prior.  There was no fracture identified.  Patient did have extensive edema venous stasis ulceration with purulent drainage and necrotic ulcers of the left lower extremity.  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  Septic arthritis and other issues noted with the joint.  Diffuse soft tissue swelling and subcutaneous edema.  Patient followed by Dr. 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  Patient was awake and alert sitting in his wheelchair.  Patient showed adequate cognition with good mental status and aware of what has been happening to him recently from a medical standpoint.  Patient reports that his wife passed away not long ago but that he has been continuing to try to maintain his current life functioning.   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  The patient denies any significant depression or anxiety and reports that he has been coping adequately with his wife's passing.  Patient reports that he has a good bit of apprehension knowing that once he leaves CIR that he will be going to a skilled nursing facility to complete his rehabilitation efforts prior to being discharged home.  Patient reports that his wife had been in for separate skilled nursing facilities and they have not always been up to what he felt was appropriate and he is apprehensive as to what to expect himself for  his care.  Disposition/Plan:  Today we worked on coping and adjustment especially addressing issues around his concerns of skilled nursing facility postdischarge from [facility].  We also addressed issues regarding his coping and expectations going forward around his ability to manage his situation and encouraged him to maintain and address his obstructive sleep apnea as he is not utilizing CPAP machine.  Diagnosis:    Left BKA         Electronically Signed   _______________________ Ilean Skill, Psy.D. Clinical Neuropsychologist

## 2021-11-07 NOTE — Progress Notes (Signed)
PROGRESS NOTE   Subjective/Complaints:  Pt reports no issues- Asking about SNF's- I'm "fine".   ROS:   Pt denies SOB, abd pain, CP, N/V/C/D, and vision changes   Objective:   No results found. No results for input(s): WBC, HGB, HCT, PLT in the last 72 hours.   Recent Labs    11/06/21 0553  NA 137  K 3.8  CL 98  CO2 31  GLUCOSE 124*  BUN 22  CREATININE 1.06  CALCIUM 8.1*    Intake/Output Summary (Last 24 hours) at 11/07/2021 1809 Last data filed at 11/07/2021 1300 Gross per 24 hour  Intake 720 ml  Output 851 ml  Net -131 ml     Pressure Injury 10/15/21 Buttocks Right;Left Stage 2 -  Partial thickness loss of dermis presenting as a shallow open injury with a red, pink wound bed without slough. patchy areas of partial thickness skin loss related to moiture associated skin damage (Active)  10/15/21 1635  Location: Buttocks  Location Orientation: Right;Left  Staging: Stage 2 -  Partial thickness loss of dermis presenting as a shallow open injury with a red, pink wound bed without slough.  Wound Description (Comments): patchy areas of partial thickness skin loss related to moiture associated skin damage  Present on Admission: Yes    Physical Exam: Vital Signs Blood pressure (!) 145/96, pulse 62, temperature 97.7 F (36.5 C), temperature source Oral, resp. rate 19, height 5\' 10"  (1.778 m), weight (!) 156.5 kg, SpO2 94 %.    General: awake, alert, appropriate, NAD HENT: conjugate gaze; oropharynx moist CV: regular rate; no JVD Pulmonary: CTA B/L; no W/R/R- good air movement GI: soft, NT, ND, (+)BS Psychiatric: appropriate Neurological: Ox3  Ext: 3+ edema right pedal and pretibial, 2+ in the right dorsum of the hand and forearm- looks more swollen again  GU: scrotal edema appears a little better. Has modified sling on Skin:  RLE venous stasis wound, chronic skin changes. Old crusted wounds near prior amp  sites Left BK in vac, with ongoing s/s drainage in cannister. Bilateral buttock wounds Neuro:  Alert and oriented x 3. Normal insight and awareness. Intact Memory. Normal language and speech. Cranial nerve exam unremarkable. Decreased sensation distal RLE.  Normal strength left l upper extremity has 4- /5 strength UE. RLE 3/5 prox to4/5 distally. No abnl tone Musculoskeletal: left stump in vac, swollen, somewhat tender    Assessment/Plan: 1. Functional deficits which require 3+ hours per day of interdisciplinary therapy in a comprehensive inpatient rehab setting. Physiatrist is providing close team supervision and 24 hour management of active medical problems listed below. Physiatrist and rehab team continue to assess barriers to discharge/monitor patient progress toward functional and medical goals  Care Tool:  Bathing    Body parts bathed by patient: Right arm, Left arm, Chest, Abdomen, Right upper leg, Left upper leg, Face   Body parts bathed by helper: Front perineal area, Buttocks, Left lower leg, Right lower leg     Bathing assist Assist Level: Total Assistance - Patient < 25%     Upper Body Dressing/Undressing Upper body dressing   What is the patient wearing?: Pull over shirt    Upper body  assist Assist Level: Moderate Assistance - Patient 50 - 74%    Lower Body Dressing/Undressing Lower body dressing      What is the patient wearing?: Pants     Lower body assist Assist for lower body dressing: 2 Helpers     Toileting Toileting    Toileting assist Assist for toileting: Dependent - Patient 0%     Transfers Chair/bed transfer  Transfers assist     Chair/bed transfer assist level: Dependent - mechanical lift     Locomotion Ambulation   Ambulation assist   Ambulation activity did not occur: Safety/medical concerns          Walk 10 feet activity   Assist  Walk 10 feet activity did not occur: Safety/medical concerns        Walk 50 feet  activity   Assist Walk 50 feet with 2 turns activity did not occur: Safety/medical concerns         Walk 150 feet activity   Assist Walk 150 feet activity did not occur: Safety/medical concerns         Walk 10 feet on uneven surface  activity   Assist Walk 10 feet on uneven surfaces activity did not occur: Safety/medical concerns         Wheelchair     Assist Is the patient using a wheelchair?: Yes Type of Wheelchair: Manual    Wheelchair assist level: Moderate Assistance - Patient 50 - 74% Max wheelchair distance: 150    Wheelchair 50 feet with 2 turns activity    Assist        Assist Level: Moderate Assistance - Patient 50 - 74%   Wheelchair 150 feet activity     Assist      Assist Level: Moderate Assistance - Patient 50 - 74%   Blood pressure (!) 145/96, pulse 62, temperature 97.7 F (36.5 C), temperature source Oral, resp. rate 19, height 5\' 10"  (1.778 m), weight (!) 156.5 kg, SpO2 94 %.  Medical Problem List and Plan: 1. Functional deficits secondary to left BKA 10/21/2021 with wound VAC applied             -patient may not shower until wound VAC removed-              -ELOS/Goals: 15-20 days- min A, likely w/c level  -con't CIR- PT and OT- SNF? 2.  Antithrombotics: -DVT/anticoagulation:  Mechanical: Antiembolism stockings, thigh (TED hose) Right lower extremity             -antiplatelet therapy: Aspirin 81 mg daily and Plavix 75 mg daily    3. Pain Management: Hydrocodone as needed -continue gabapentin for phantom limb pain, 100mg  bid  2/12- pain controlled per pt- con't regimen  2/20- denies pain- con't regimen 4. Mood: Provide emotional support             -antipsychotic agents: N/A 5. Neuropsych: This patient is capable of making decisions on his own behalf. 6. Skin/Wound Care: Routine skin checks.  MASD on buttocks and lymphedema/venous stasis ulcers on RLE- was told Pressure ulcer on buttocks, but not per WOC or nursing.   WOC  follow-up wound care as directed.  -  2/19 continue wound VAC until drainage tapers, would touch base with Ortho in regards to this prior to discontinuation 7. Fluids/Electrolytes/Nutrition: Routine in and outs with follow-up chemistries 8.  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  Status post EGD and colonoscopy 10/19/2021.  Continue  PPI x8 weeks. 9.  Acute blood loss anemia. Stable 2/17 CBC Latest Ref Rng & Units 10/31/2021 10/25/2021 10/24/2021  WBC 4.0 - 10.5 K/uL 7.9 8.6 10.0  Hemoglobin 13.0 - 17.0 g/dL 7.8(L) 7.6(L) 7.6(L)  Hematocrit 39.0 - 52.0 % 26.5(L) 25.1(L) 25.1(L)  Platelets 150 - 400 K/uL 243 229 242     -fe++ supp 10.  History of CVA.  Aspirin Plavix as prior to admission. 11.  AKI.  Baseline creatinine 1.3.    -BMET/Cr are improving--now off IVF Given diuresis we will recheck basic metabolic panel in the morning 2/20- last Cr 1.06- 2/19- doing better 12.  Hypertension.  Blood pressure somewhat soft with hydralazine discontinued Toprol decreased to 25 mg daily to help perfusion for kidneys.   Vitals:   11/07/21 1319 11/07/21 1537  BP: 140/75 (!) 145/96  Pulse: 65 62  Resp: 17 19  Temp: 97.8 F (36.6 C) 97.7 F (36.5 C)  SpO2: 93% 94%  Start torsemide  and monitor, blood pressure improved with diuretic  13.  Diabetes mellitus.  Hemoglobin A1c 7.3.  Semglee initiated while in the hospital 10 units daily.     CBG (last 3)  Recent Labs    11/07/21 0638 11/07/21 1222 11/07/21 1646  GLUCAP 139* 132* 123*    2/20- controlled CBGs- con't regimen 14.  Morbid obesity.  BMI 50.22.  Dietary follow-up 15.  Hyperlipidemia.  Lipitor 16.  History of gout.  Zyloprim 300 mg daily.  Monitor for any gout flareups 17.  Acute on chronic diastolic congestive heart failure.  Still with 3+ edema LE on RIght and 4+ in the right pedal, will order foot pump , Alb low at 1.8 switched to torsemide  Filed Weights   11/05/21  0327 11/06/21 0322 11/07/21 0535  Weight: (!) 157 kg (!) 157.9 kg (!) 156.5 kg  Last 2 readings have been fairly stable continue to monitor, expect lower reading as diuresis continues, may have some downward trend over the next several days with continued diuresis. 2/20- weight down slightly from yesterday- con't regimen 18. R hand swelling- suggest K tape for R hand, elevation  2/12- ktape off- but swelling almost resolved 19. Scrotal swelling- elevation/ towel--improving      LOS: 13 days A FACE TO FACE EVALUATION WAS PERFORMED  [name] [name] 11/07/2021, 6:09 PM

## 2021-11-07 NOTE — Progress Notes (Signed)
Patient ID: [name] [name], male   DOB: 1956-08-29, 66 y.o.   MRN: [id number] Met with pt who gave worker his list of preferred facilities-[facility], [facility] made aware with his weight being over 300 lbs they will need to have lifting equipment to accommodate this. Will sent out FL2 to these facilities and then will need to expand search if no offers.

## 2021-11-07 NOTE — Progress Notes (Signed)
°   11/07/21 1537  What Happened  Was fall witnessed? Yes  Who witnessed fall? Therapy  Patients activity before fall other (comment) (transferring with Clarise Cruz Lift to [facility])  Point of contact buttocks  Was patient injured? No  Follow Up  MD notified Silvestre Mesi, PA-C  Time MD notified [phone]  Family notified Yes - comment  Time family notified 1542  Additional tests No  Progress note created (see row info) Yes  Adult Fall Risk Assessment  Risk Factor Category (scoring not indicated) Fall has occurred during this admission (document High fall risk)  Patient Fall Risk Level High fall risk  Adult Fall Risk Interventions  Required Bundle Interventions *See Row Information* High fall risk - low, moderate, and high requirements implemented  Additional Interventions Use of appropriate toileting equipment (bedpan, BSC, etc.)  Screening for Fall Injury Risk (To be completed on HIGH fall risk patients) - Assessing Need for Floor Mats  Risk For Fall Injury- Criteria for Floor Mats Bleeding risk-anticoagulation (not prophylaxis)  Will Implement Floor Mats Yes  Vitals  Temp 97.7 F (36.5 C)  Temp Source Oral  BP (!) 145/96  MAP (mmHg) 111  BP Location Right Arm  BP Method Automatic  Patient Position (if appropriate) Sitting  Pulse Rate 62  Pulse Rate Source Monitor  Resp 19  Oxygen Therapy  SpO2 94 %  O2 Device Room Air  Pain Assessment  Pain Scale 0-10  Pain Score 0  PCA/Epidural/Spinal Assessment  Respiratory Pattern Regular;Unlabored  Neurological  Neuro (WDL) WDL  RLE Motor Response Purposeful movement  RLE Motor Strength 3  LLE Motor Response Purposeful movement  LLE Motor Strength 3  Neuro Symptoms None  Musculoskeletal  Musculoskeletal (WDL) X  Assistive Device Clarise Cruz Lift;[employer], retail;Wheelchair  Generalized Weakness Yes  Weight Bearing Restrictions Yes  LLE Weight Bearing NWB  Musculoskeletal Details  RLE Swelling  LLE BKA  Integumentary  Integumentary  (WDL) X  Skin Integrity Sherlynn Carbon

## 2021-11-07 NOTE — Progress Notes (Signed)
Occupational Therapy Session Note  Patient Details  Name: [name] [name] MRN: [id number] Date of Birth: May 12, 1956  Today's Date: 11/07/2021 OT Individual Time: 0900-1000 OT Individual Time Calculation (min): 60 min    Short Term Goals: Week 2:  OT Short Term Goal 1 (Week 2): STGs = modified LTGs     Skilled Therapeutic Interventions/Progress Updates:     Pt received in bed. He stated he did not have pants on and took a bath yesterday so did not need to do that today. He stated his shirt was clean. No clean shorts available so obtained the xxl scrub pants. (Largest size available) Pt worked on rolling numerous times to adjust brief and pad under him, pull pants over hips. Pt can roll 90% of the way himself but then needs A to fully come onto his side, mostly due to size of twin bed.    His R leg bandage draining through, notified RN  Pt then worked on UE strengthening exercises with resisted rows using band and bar, overhead reaches with bar,  assisted ab crunches.  Pt then worked on [employer] transfers. Mod A to sit to EOB. Max of 2 to slide to L in wc (used step bench below R leg).  Pt had great difficulty scooting back in wc and needed max cues to fully push up with arms to lift his hips enough to scoot back into seat.   Pt resting in wc with all needs met.  Belt alarm on.    Therapy Documentation Precautions:  Precautions Precautions: Fall Precaution Comments: new L BKA, pressure sores on L/R bottom, 2L O2, would vac Restrictions Weight Bearing Restrictions: Yes LLE Weight Bearing: Non weight bearing  Pain: Pain Assessment Pain Score: 0-No pain ADL: ADL Eating: Independent Grooming: Setup Upper Body Bathing: Setup Where Assessed-Upper Body Bathing: Bed level Lower Body Bathing: Maximal assistance Where Assessed-Lower Body Bathing: Bed level Upper Body Dressing: Minimal assistance Where Assessed-Upper Body Dressing: Bed level Lower Body Dressing: Maximal  assistance Where Assessed-Lower Body Dressing: Bed level Toileting: Not assessed Toilet Transfer: Other (comment) (+ 3 with slide board, but then unable to wt shift well enough to manage clothing and cleansing) Tub/Shower Transfer: Not assessed   Therapy/Group: Individual Therapy  Signe Tackitt 11/07/2021, 8:02 AM

## 2021-11-08 LAB — GLUCOSE, CAPILLARY
Glucose-Capillary: 130 mg/dL — ABNORMAL HIGH (ref 70–99)
Glucose-Capillary: 126 mg/dL — ABNORMAL HIGH (ref 70–99)
Glucose-Capillary: 124 mg/dL — ABNORMAL HIGH (ref 70–99)
Glucose-Capillary: 143 mg/dL — ABNORMAL HIGH (ref 70–99)

## 2021-11-08 NOTE — Patient Care Conference (Signed)
Inpatient RehabilitationTeam Conference and Plan of Care Update Date: 11/08/2021   Time: 12:13 PM    Patient Name: [name] [name]      Medical Record Number: [id number]  Date of Birth: [birth year] Sex: Male         Room/Bed: 4W14C/4W14C-01 Payor Info: Payor: MEDICARE / Plan: MEDICARE PART A AND B / Product Type: *No Product type* /    Admit Date/Time:  10/25/2021 12:32 PM  Primary Diagnosis:  Left below-knee amputee [facility])  Hospital Problems: Principal Problem:   Left below-knee amputee [facility])    Expected Discharge Date: Expected Discharge Date:  (SNF)  Team Members Present: Physician leading conference: Dr. Courtney Heys Social Worker Present: Ovidio Kin, LCSW Nurse Present: Dorien Chihuahua, RN PT Present: Estevan Ryder, PT OT Present: Meriel Pica, OT PPS Coordinator present : Gunnar Fusi, SLP     Current Status/Progress Goal Weekly Team Focus  Bowel/Bladder     Continent of bladder w urinal, constipation and refusing meds   Continent of bowel and bladder   Toileting, laxatives/stool softeners  Swallow/Nutrition/ Hydration             ADL's   max A overall with bathing and dressing, max A of 2 slide board or maximove transfers.  Poor shoulder mobility.  Poor trunk control.  mod LB dressing, toileting; CGA toilet transfer (need down grade goals at PN interval on 2/16).  ADL and functional mobility training   Mobility   remains MaxA bed mob, MaxA/TotalA x2 SBT or maximove, spv wc mob, sit<> stand with Clarise Cruz Plus + MaxA x2  downgraded to [facility], will need to be downgraded more  strength, transfers, wc mob, pt/fam ed   Communication             Safety/Cognition/ Behavioral Observations            Pain     Minimal pain   Pain managed with prns   Assess need for and effectiveness of medications  Skin     MASD to buttocks, Stasis wound right calf/shin draining, wound vac to left amputation site   MASD healing, stasis wound healing, wound vac off and dressing changes to  incision   Monitor drainage from wound vac; hopeful to remove today, continue to keep buttocks dry/patient continent, and dressing changes to right leg    Discharge Planning:  Pt is agreeable to going to a SNF and gave worker his choices-he will be difficult to place due to his level of care and his weight   Team Discussion: Patient displays little effort to participate with therapy, and self limiting behaviors. MD notes creatine improved after diuresis. Weight is stable. Note right leg looks better than previous note; continue to have moderate drainage from wounds. Wound Vac left leg incision.  Patient on target to meet rehab goals: Patient currently needs max assist - total assist for slideboard transfers; using a maxi move for safety. Can complete sit - stand with max assist of 2 and requires only supervision for w/c mobility.  *See Care Plan and progress notes for long and short-term goals.   Revisions to Treatment Plan:  Down graded goals to max assist Changed to 15/7 therapy sessions   Teaching Needs: Safety, transfers, toileting, medications, skin care, dressing changes, secondary risk management, etc  Current Barriers to Discharge: Decreased caregiver support, Home enviroment access/layout, and Wound care  Possible Resolutions to Barriers: SNF recommended     Medical Summary Current Status: leg a little less swollen- wound OK-  need to get Wound VAC changed?- wil call Dr Sharol Given  Barriers to Discharge: Behavior;Decreased family/caregiver support;Home enviroment access/layout;Medical stability;Weight;Weight bearing restrictions;Wound care;Insurance for SNF coverage;Medication compliance  Barriers to Discharge Comments: self limting- will make no effort- max assist to total of 2-3 to transfer- on low air mattress -fall yesterday- Sarah plus Possible Resolutions to [employer]: no limb guard will fit- L BKA- will call Dr Sharol Given to see if can change wound VAC_ supervison w/c-  goals mod A- 15/7- will work on constipation   Continued Need for Acute Rehabilitation Level of Care: The patient requires daily medical management by a physician with specialized training in physical medicine and rehabilitation for the following reasons: Direction of a multidisciplinary physical rehabilitation program to maximize functional independence : Yes Medical management of patient stability for increased activity during participation in an intensive rehabilitation regime.: Yes Analysis of laboratory values and/or radiology reports with any subsequent need for medication adjustment and/or medical intervention. : Yes   I attest that I was present, lead the team conference, and concur with the assessment and plan of the team.   Dorien Chihuahua B 11/08/2021, 1:14 PM

## 2021-11-08 NOTE — Progress Notes (Signed)
Occupational Therapy Note  Patient Details  Name: [name] [name] MRN: [id number] Date of Birth: 1956/02/17  During team conference, the team discussed this patients limited endurance and decreased ability to fully participate in rehab.  Team has recommended pt be on a 15/7 schedule to accommodate his needs.    [place] 11/08/2021, 12:33 PM

## 2021-11-08 NOTE — Progress Notes (Signed)
Patient ID: [name] [name], male   DOB: 06/20/1956, 66 y.o.   MRN: [id number]  Met with pt to update him on team conference. Discussed the therapy team does not feel he is putting forth much effort and this is concerning due to not making any progress this week. He states: " I feel like I am doing what they ask me to do and will do more if they tell me what to do." Discussed he needs to put forth all of his effort to improve so he can get home eventually. Aware PA to ask Dr Sharol Given to come in and look at his leg and guide [place] on how long the wound vac needs to stay on. He continues to have much drainage from this. Will sent out FL2 and see if any offers.

## 2021-11-08 NOTE — Progress Notes (Signed)
Physical Therapy Session Note  Patient Details  Name: [name] [name] MRN: [id number] Date of Birth: 12/23/1955  Today's Date: 11/08/2021 PT Individual Time: 1305-1332 PT Individual Time Calculation (min): 27 min   Short Term Goals: Week 2:  PT Short Term Goal 1 (Week 2): STG= LTG based on ELOS  Skilled Therapeutic Interventions/Progress Updates:     Pt received seated in [facility] and agrees to therapy. No complaint of pain. WC transport to gym for time management. Pt performs slide board transfer to mat with maxA +1 for first half but requires maxA +2 for 2nd half of transfer. PT provides verbal and tactile cues for body mechanics, specifically forward trunk lean, to facilitate optimal functional mobility. Once seated on edge of mat, pt perform alternating side leans onto elbows and powering back up to midline with verbal cues for initiation. Pt then performs anterior trunk lean to reach for ball placed on end of platform between legs. PT cues to use opposite upper extremity for support on platform. Pt gradually reaches further forward with consistent cueing. Pt performs slideboard transfer to [facility] with totalA +2 and difficulty with anterior trunk lean. WC transport back to room. MaxA +2 for slideboard transfer to bed. ModA +2 for sit to supine. Left with alarm intact and all needs within reach.  Therapy Documentation Precautions:  Precautions Precautions: Fall Precaution Comments: new L BKA, pressure sores on L/R bottom, would vac Restrictions Weight Bearing Restrictions: Yes LLE Weight Bearing: Non weight bearing    Therapy/Group: Individual Therapy  Breck Coons, PT, DPT 11/08/2021, 3:46 PM

## 2021-11-08 NOTE — NC FL2 (Addendum)
[place] LEVEL OF CARE SCREENING TOOL     IDENTIFICATION  Patient Name: [name] [name] Birthdate: May 08, 1956 Sex: male Admission Date (Current Location): 10/25/2021  [facility] and [place] Number:  Herbalist and Address:  [facility], [place] [address] [place], [place] [address]      Provider Number: [id number]  Attending Physician Name and Address:  Courtney Heys, MD  Relative Name and Phone Number:  Rubin Payor Jobe-daughter [phone]    Current Level of Care: Other (Comment) (Rehab) Recommended Level of Care: [place] Prior Approval Number:    Date Approved/Denied:   PASRR Number: [id number]  Discharge Plan: SNF    Current Diagnoses: Patient Active Problem List   Diagnosis Date Noted   Left below-knee amputee ([place]) 10/25/2021   Left leg pain    Osteomyelitis of ankle and foot ([place]) 10/15/2021   Anemia 10/15/2021   Pressure ulcer 10/15/2021   History of CVA (cerebrovascular accident) 10/15/2021   Septic arthritis of ankle and foot region ([place]) 10/15/2021   AKI (acute kidney injury) ([place]) 10/14/2021   Pneumonia due to COVID-19 virus 09/09/2019   Hypokalemia 09/09/2019   Acute respiratory failure with hypoxia ([place]) 09/09/2019   Essential hypertension 10/14/2014   Chronic diastolic heart failure ([place]) 10/14/2014   Morbid obesity ([place]) 07/22/2013   Varicose veins of lower extremities with other complications 24/40/1027   OSA (obstructive sleep apnea) 07/22/2013   Venous stasis ulcers ([place]) 07/22/2013   Diabetes mellitus ([place]) 01/03/2011    Orientation RESPIRATION BLADDER Height & Weight     Self, Time, Situation, Place  Normal Incontinent (uses urinal with assist) Weight: (!) 343 lb 14.7 oz (156 kg) Height:  5\' 10"  (177.8 cm)  BEHAVIORAL SYMPTOMS/MOOD NEUROLOGICAL BOWEL NUTRITION STATUS      Continent Diet (Carb modified thin liquids)  AMBULATORY STATUS COMMUNICATION OF NEEDS Skin   Extensive  Assist Verbally Surgical wounds, Wound Vac, PU Stage and Appropriate Care    Wound vac removed 2/21                   Personal Care Assistance Level of Assistance  Bathing, Dressing Bathing Assistance: Maximum assistance Feeding assistance: Independent Dressing Assistance: Maximum assistance     Functional Limitations Info  Sight Sight Info: Impaired (wears glasses)        SPECIAL CARE FACTORS FREQUENCY  PT (By licensed PT), OT (By licensed OT), Bowel and bladder program (right calf)     PT Frequency: 5x week OT Frequency: 5x week Bowel and Bladder Program Frequency: Timed toliet for bladder          Contractures Contractures Info: Not present    Additional Factors Info  Code Status, Allergies Code Status Info: Full Code Allergies Info: No Known allergies           Current Medications (11/08/2021):  This is the current hospital active medication list Current Facility-Administered Medications  Medication Dose Route Frequency Provider Last Rate Last Admin   acetaminophen (TYLENOL) tablet 325-650 mg  325-650 mg Oral Q6H PRN Cathlyn Parsons, PA-C   650 mg at 11/08/21 1300   allopurinol (ZYLOPRIM) tablet 300 mg  300 mg Oral Daily Alger Simons T, MD   300 mg at 11/08/21 2536   ascorbic acid (VITAMIN C) tablet 1,000 mg  1,000 mg Oral Daily Cathlyn Parsons, PA-C   1,000 mg at 11/08/21 6440   aspirin EC tablet 81 mg  81 mg Oral Daily Angiulli, Lavon Paganini,  PA-C   81 mg at 11/08/21 0843   atorvastatin (LIPITOR) tablet 10 mg  10 mg Oral Daily Cathlyn Parsons, PA-C   10 mg at 11/08/21 9390   clopidogrel (PLAVIX) tablet 75 mg  75 mg Oral Daily Cathlyn Parsons, PA-C   75 mg at 11/08/21 3009   docusate sodium (COLACE) capsule 100 mg  100 mg Oral BID Cathlyn Parsons, PA-C   100 mg at 11/08/21 2330   ferrous sulfate tablet 325 mg  325 mg Oral Q breakfast Meredith Staggers, MD   325 mg at 11/08/21 0762   gabapentin (NEURONTIN) capsule 100 mg  100 mg Oral BID Meredith Staggers, MD   100 mg at 11/08/21 2633   Gerhardt's butt cream   Topical TID Charlett Blake, MD   Given at 11/07/21 1336   HYDROcodone-acetaminophen (NORCO/VICODIN) 5-325 MG per tablet 1-2 tablet  1-2 tablet Oral Q4H PRN Cathlyn Parsons, PA-C   1 tablet at 11/07/21 3545   insulin aspart (novoLOG) injection 0-15 Units  0-15 Units Subcutaneous TID WC Cathlyn Parsons, PA-C   2 Units at 11/08/21 1251   insulin glargine-yfgn [facility]) injection 10 Units  10 Units Subcutaneous Daily Cathlyn Parsons, PA-C   10 Units at 11/08/21 6256   metoprolol succinate (TOPROL-XL) 24 hr tablet 25 mg  25 mg Oral Daily Cathlyn Parsons, PA-C   25 mg at 11/08/21 3893   nutrition supplement (JUVEN) (JUVEN) powder packet 1 packet  1 packet Oral BID BM AngiulliLavon Paganini, PA-C   1 packet at 11/07/21 1336   ondansetron (ZOFRAN) tablet 4 mg  4 mg Oral Q6H PRN Cathlyn Parsons, PA-C       Or   ondansetron [facility]) injection 4 mg  4 mg Intravenous Q6H PRN Angiulli, Lavon Paganini, PA-C       pantoprazole (PROTONIX) EC tablet 40 mg  40 mg Oral BID Cathlyn Parsons, PA-C   40 mg at 11/08/21 7342   polyethylene glycol (MIRALAX / GLYCOLAX) packet 17 g  17 g Oral BID Cathlyn Parsons, PA-C   17 g at 11/03/21 2216   polyethylene glycol (MIRALAX / GLYCOLAX) packet 17 g  17 g Oral Daily PRN Cathlyn Parsons, PA-C   17 g at 10/29/21 0801   potassium chloride (KLOR-CON M) CR tablet 10 mEq  10 mEq Oral Daily Charlett Blake, MD   10 mEq at 11/08/21 8768   torsemide (DEMADEX) tablet 10 mg  10 mg Oral Daily Charlett Blake, MD   10 mg at 11/08/21 1157     Discharge Medications: Please see discharge summary for a list of discharge medications.  Relevant Imaging Results:  Relevant Lab Results:   Additional Information SSN: [id number] COVID vaccines x 2  Latitia Housewright, Gardiner Rhyme, LCSW

## 2021-11-08 NOTE — Progress Notes (Signed)
Occupational Therapy Session Note  Patient Details  Name: [name] [name] MRN: [id number] Date of Birth: [birth year]  Today's Date: 11/08/2021 OT Individual Time: 1100-1200 OT Individual Time Calculation (min): 60 min    Short Term Goals: Week 2:  OT Short Term Goal 1 (Week 2): STGs = modified LTGs  Skilled Therapeutic Interventions/Progress Updates:    Pt in wheelchair to start with transport down to the therapy gym for UE strengthening.  He was able to complete 3 sets of 5 mins of BUE strengthening with use of the UE ergonometer.  He completed the 1st set peddling forward and the next 3 peddling backwards. He was able to maintain RPMs at 20 through all sets with frequent re-gripping needed in the right hand.  Oxygen sats 93% on room air with HR at 104.  Next, had him complete sliding board transfer to the therapy mat with total +2 (pt 20%) and then back to the chair after a brief rest break.  Returned to the room at the end of the session with the call button and phone in reach and safety alarm belt in place.  Nursing in as well to assist with addressing wound vac.   Therapy Documentation Precautions:  Precautions Precautions: Fall Precaution Comments: new L BKA, pressure sores on L/R bottom, would vac Restrictions Weight Bearing Restrictions: Yes LLE Weight Bearing: Non weight bearing   Pain: Pain Assessment Pain Scale: Faces Pain Score: 0-No pain    Therapy/Group: Individual Therapy  Bari Handshoe OTR/L 11/08/2021, 12:34 PM

## 2021-11-08 NOTE — Progress Notes (Signed)
Occupational Therapy Session Note  Patient Details  Name: [name] [name] MRN: [id number] Date of Birth: July 14, 1956  Today's Date: 11/08/2021 OT Individual Time: 1335-1430 OT Individual Time Calculation (min): 55 min    Short Term Goals: Week 1:  OT Short Term Goal 1 (Week 1): Pt will complete BSC/toilet transfer with Mod x2 OT Short Term Goal 1 - Progress (Week 1): Not progressing OT Short Term Goal 2 (Week 1): Pt will perform LB dress with Max A with AE PRN OT Short Term Goal 2 - Progress (Week 1): Progressing toward goal OT Short Term Goal 3 (Week 1): Pt will participate in task of choice/ADL for 10 minutes without rest break OT Short Term Goal 3 - Progress (Week 1): Progressing toward goal Week 2:  OT Short Term Goal 1 (Week 2): STGs = modified LTGs      Skilled Therapeutic Interventions/Progress Updates:    Pt received in wc and agreeable to working on UE strength.  Pt concerned about how therapy team feels he is not putting forth much effort.  Explained to pt that although he always participates, never has refused a session and does the exercises we give him, when it comes to his actual bed mobility skills and transfers he is not putting enough effort to overcome his challenges. He has a large body habitus and abdomen which makes upright sitting and forward leaning more challenging, decreased RUE/LE strength from CVA 20 yrs ago, and decreased activity tolerance.  Addressed each area with pt in detail and what he needs to do to try to overcome/deal with these challenges.  Demonstrated to pt what movement patterns he needs to work on to decrease the max level of A he has been requiring.  Explained that his effort will need to be above and beyond when he tries to move.  Even with this, tried to acknowledge how all of his challenges together make his transfers so difficult and why rehab in SNF is the best discharge placement for now.  Pt seemed to understand what he needs to do.    Then spent  time working on the mechanics of leaning forward, pushing through B arms on arm rests, weight shifting to get his hips to scoot and move in the chair.  He continues to need A to fully get hips back in wc.    Pt then worked on a series of UE exercises using hand wts for biceps,  green theraband and bar for tricep extensions and rows.   Pt resting in wc with all needs met.    Therapy Documentation Precautions:  Precautions Precautions: Fall Precaution Comments: new L BKA, pressure sores on L/R bottom, would vac Restrictions Weight Bearing Restrictions: Yes LLE Weight Bearing: Non weight bearing    Vital Signs: Therapy Vitals Temp: 97.8 F (36.6 C) Temp Source: Oral Pulse Rate: 70 Resp: 18 BP: 134/79 Patient Position (if appropriate): Sitting Oxygen Therapy SpO2: 95 % O2 Device: Room Air Pain: no c/o pain   ADL: ADL Eating: Independent Grooming: Setup Upper Body Bathing: Setup Where Assessed-Upper Body Bathing: Bed level Lower Body Bathing: Maximal assistance Where Assessed-Lower Body Bathing: Bed level Upper Body Dressing: Minimal assistance Where Assessed-Upper Body Dressing: Bed level Lower Body Dressing: Maximal assistance Where Assessed-Lower Body Dressing: Bed level Toileting: Not assessed Toilet Transfer: Other (comment) (+ 3 with slide board, but then unable to wt shift well enough to manage clothing and cleansing) Tub/Shower Transfer: Not assessed   Therapy/Group: Individual Therapy  [name] [name] 11/08/2021, 4:11  PM

## 2021-11-08 NOTE — Progress Notes (Signed)
Physical Therapy Session Note  Patient Details  Name: [name] [name] MRN: [id number] Date of Birth: [birth year]  Today's Date: 11/08/2021 PT Individual Time: 1000-1025 PT Individual Time Calculation (min): 25 min   Short Term Goals: Week 2:  PT Short Term Goal 1 (Week 2): STG= LTG based on ELOS  Skilled Therapeutic Interventions/Progress Updates:    Patient received asleep in bed, initially difficult to wake, but agreeable to PT. He denies pain. Patient continuing to require MaxA/TotalA to don pants at bedlevel. He also continues to require up to MaxA for rolling B to finish pulling up pants and to place maxisky sling. MaxiSky used to transfer to wc. Patient dependently repositioned in wc. Seatbelt alarm on, call light within reach.   Therapy Documentation Precautions:  Precautions Precautions: Fall Precaution Comments: new L BKA, pressure sores on L/R bottom, 2L O2, would vac Restrictions Weight Bearing Restrictions: Yes LLE Weight Bearing: Non weight bearing    Therapy/Group: Individual Therapy  Karoline Caldwell, PT, DPT, CBIS  11/08/2021, 7:30 AM

## 2021-11-08 NOTE — Progress Notes (Signed)
Pts wound vac removed. LLE wrapped with ABD, Kerlix, and ACE wrap. Pt tolerated.   Wound drainage since 0700 @ 50.

## 2021-11-09 ENCOUNTER — Telehealth: Payer: Self-pay | Admitting: Radiology

## 2021-11-09 LAB — GLUCOSE, CAPILLARY
Glucose-Capillary: 115 mg/dL — ABNORMAL HIGH (ref 70–99)
Glucose-Capillary: 143 mg/dL — ABNORMAL HIGH (ref 70–99)
Glucose-Capillary: 116 mg/dL — ABNORMAL HIGH (ref 70–99)
Glucose-Capillary: 130 mg/dL — ABNORMAL HIGH (ref 70–99)

## 2021-11-09 NOTE — Progress Notes (Signed)
Occupational Therapy Session Note  Patient Details  Name: [name] [name] MRN: [id number] Date of Birth: 05-30-1956  Today's Date: 11/09/2021 OT Individual Time: 0830-0930 OT Individual Time Calculation (min): 60 min    Short Term Goals: Week 1:  OT Short Term Goal 1 (Week 1): Pt will complete BSC/toilet transfer with Mod x2 OT Short Term Goal 1 - Progress (Week 1): Not progressing OT Short Term Goal 2 (Week 1): Pt will perform LB dress with Max A with AE PRN OT Short Term Goal 2 - Progress (Week 1): Progressing toward goal OT Short Term Goal 3 (Week 1): Pt will participate in task of choice/ADL for 10 minutes without rest break OT Short Term Goal 3 - Progress (Week 1): Progressing toward goal Week 2:  OT Short Term Goal 1 (Week 2): STGs = modified LTGs     Skilled Therapeutic Interventions/Progress Updates:    Pt received in bed and agreeable to self care.  Pt worked on bed mobility skills of rolling and using hand rails on bed to pull to partial sitting.  Pt was able roll with only S to his R and CGA to his L today, so that has improved.  Worked for quite sometime on him engaging his core and arms to be able to sit up partially.  In this position, pt helped to pull pants over R foot with mod A. Then in supine rolled to get pants over hips with min A.  Pt needed mod A to doff shirt, min A to don.  Difficult to don shirt in bed as the size wize bed does not elevate HOB far.  Planned to use SB to get in wc but once pt started to transfer to EOB, his basket ball shorts material started slipping on slick air mattress and pt sliding too far to edge of bed.  With +2 A, used maxi sky to transfer pt to wc.   Once in w/c, pt needed to scoot back using his arms and forward lean. Needed multiple cues and trials but was eventually able to scoot back.    His daughter arrived, discussed with her and the pt his difficulty progressing in rehab due to prior CVA, deconditioning, etc.   Pt resting in wc  with all needs met.    Therapy Documentation Precautions:  Precautions Precautions: Fall Precaution Comments: new L BKA, pressure sores on L/R bottom, would vac Restrictions Weight Bearing Restrictions: Yes LLE Weight Bearing: Non weight bearing  Pain: pt described some pinching sensations after wound vac removal   ADL: ADL Eating: Independent Grooming: Setup Upper Body Bathing: Setup Where Assessed-Upper Body Bathing: Bed level Lower Body Bathing: Moderate assistance Where Assessed-Lower Body Bathing: Bed level Upper Body Dressing: Minimal assistance Where Assessed-Upper Body Dressing: Bed level Lower Body Dressing: Moderate assistance Where Assessed-Lower Body Dressing: Bed level Toileting: Not assessed Toilet Transfer: Other (comment) (+ 3 with slide board, but then unable to wt shift well enough to manage clothing and cleansing) Tub/Shower Transfer: Not assessed   Therapy/Group: Individual Therapy  [place] 11/09/2021, 11:00 AM

## 2021-11-09 NOTE — Telephone Encounter (Signed)
A PA from the hospital called and asked if Dr. Sharol Given could come by and see this patient that he has a great amount of drainage, states that the wound vac has been removed. --PA was speaking fast and I did not get his name.

## 2021-11-09 NOTE — Progress Notes (Signed)
PROGRESS NOTE   Subjective/Complaints:  Pt admits we keep discussing with him need to participate more in therapy-says he will think about it and see what's possible.  Wound VAC removed- having a lot of drainage.   LBM 2 nights ago.denies constipation   ROS:   Pt denies SOB, abd pain, CP, N/V/C/D, and vision changes     Objective:   No results found. No results for input(s): WBC, HGB, HCT, PLT in the last 72 hours.   No results for input(s): NA, K, CL, CO2, GLUCOSE, BUN, CREATININE, CALCIUM in the last 72 hours.   Intake/Output Summary (Last 24 hours) at 11/09/2021 1816 Last data filed at 11/09/2021 1302 Gross per 24 hour  Intake 940 ml  Output 1000 ml  Net -60 ml     Pressure Injury 10/15/21 Buttocks Right;Left Stage 2 -  Partial thickness loss of dermis presenting as a shallow open injury with a red, pink wound bed without slough. patchy areas of partial thickness skin loss related to moiture associated skin damage (Active)  10/15/21 1635  Location: Buttocks  Location Orientation: Right;Left  Staging: Stage 2 -  Partial thickness loss of dermis presenting as a shallow open injury with a red, pink wound bed without slough.  Wound Description (Comments): patchy areas of partial thickness skin loss related to moiture associated skin damage  Present on Admission: Yes    Physical Exam: Vital Signs Blood pressure 140/82, pulse 67, temperature 97.9 F (36.6 C), temperature source Oral, resp. rate 18, height 5\' 10"  (1.778 m), weight (!) 156.2 kg, SpO2 95 %.     General: awake, alert, appropriate, NAD HENT: conjugate gaze; oropharynx moist CV: regular rate; no JVD Pulmonary: CTA B/L; no W/R/R- good air movement GI: soft, NT, ND, (+)BS Psychiatric: appropriate Neurological: Ox3  Ext: 3+ edema right pedal and pretibial, no change on RLE; wound vac off on L BKA- covered with dressing and ACE wrap.  GU: scrotal  edema stable- per pt is "normal for him" Skin:  RLE venous stasis wound, chronic skin changes. Old crusted wounds near prior amp sites B/L  buttocks wounds- always in same position- sitting up with most pressure on buttocks on phone in AM Neuro:  Alert and oriented x 3. Normal insight and awareness. Intact Memory. Normal language and speech. Cranial nerve exam unremarkable. Decreased sensation distal RLE.  Normal strength left l upper extremity has 4- /5 strength UE. RLE 3/5 prox to4/5 distally. No abnl tone Musculoskeletal: left stump in vac, swollen, somewhat tender     Assessment/Plan: 1. Functional deficits which require 3+ hours per day of interdisciplinary therapy in a comprehensive inpatient rehab setting. Physiatrist is providing close team supervision and 24 hour management of active medical problems listed below. Physiatrist and rehab team continue to assess barriers to discharge/monitor patient progress toward functional and medical goals  Care Tool:  Bathing    Body parts bathed by patient: Right arm, Left arm, Chest, Abdomen, Right upper leg, Left upper leg, Face   Body parts bathed by helper: Front perineal area, Buttocks, Left lower leg, Right lower leg     Bathing assist Assist Level: Moderate Assistance - Patient 50 - 74%  Upper Body Dressing/Undressing Upper body dressing   What is the patient wearing?: Pull over shirt    Upper body assist Assist Level: Minimal Assistance - Patient > 75%    Lower Body Dressing/Undressing Lower body dressing      What is the patient wearing?: Pants     Lower body assist Assist for lower body dressing: Moderate Assistance - Patient 50 - 74% (bed level)     Toileting Toileting    Toileting assist Assist for toileting: Dependent - Patient 0%     Transfers Chair/bed transfer  Transfers assist     Chair/bed transfer assist level: Dependent - mechanical lift     Locomotion Ambulation   Ambulation assist    Ambulation activity did not occur: Safety/medical concerns          Walk 10 feet activity   Assist  Walk 10 feet activity did not occur: Safety/medical concerns        Walk 50 feet activity   Assist Walk 50 feet with 2 turns activity did not occur: Safety/medical concerns         Walk 150 feet activity   Assist Walk 150 feet activity did not occur: Safety/medical concerns         Walk 10 feet on uneven surface  activity   Assist Walk 10 feet on uneven surfaces activity did not occur: Safety/medical concerns         Wheelchair     Assist Is the patient using a wheelchair?: Yes Type of Wheelchair: Manual    Wheelchair assist level: Moderate Assistance - Patient 50 - 74% Max wheelchair distance: 150    Wheelchair 50 feet with 2 turns activity    Assist        Assist Level: Moderate Assistance - Patient 50 - 74%   Wheelchair 150 feet activity     Assist      Assist Level: Moderate Assistance - Patient 50 - 74%   Blood pressure 140/82, pulse 67, temperature 97.9 F (36.6 C), temperature source Oral, resp. rate 18, height 5\' 10"  (1.778 m), weight (!) 156.2 kg, SpO2 95 %.  Medical Problem List and Plan: 1. 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  2.  Antithrombotics: -DVT/anticoagulation:  Mechanical: Antiembolism stockings, thigh (TED hose) Right lower extremity             -antiplatelet therapy: Aspirin 81 mg daily and Plavix 75 mg daily    3. Pain Management: Hydrocodone as needed -continue gabapentin for phantom limb pain, 100mg  bid  2/12- pain controlled per pt- con't regimen  2/22- denies pain- con't regimen 4. Mood: Provide emotional support             -antipsychotic agents:  N/A 5. Neuropsych: This patient is capable of making decisions on his own behalf. 6. Skin/Wound Care: Routine skin checks.  MASD on buttocks and lymphedema/venous stasis ulcers on RLE- was told Pressure ulcer on buttocks, but not per WOC or nursing.   WOC follow-up wound care as directed.  -  2/19 continue wound VAC until drainage tapers, would touch base with Ortho in regards  to this prior to discontinuation  2/22- VAC removed- L BKA doesn't appear great, but will let Dr Sharol Given assess viability 7. Fluids/Electrolytes/Nutrition: Routine in and outs with follow-up chemistries 8.  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  Status post EGD and colonoscopy 10/19/2021.  Continue PPI x8 weeks. 9.  Acute blood loss anemia. Stable 2/17 CBC Latest Ref Rng & Units 10/31/2021 10/25/2021 10/24/2021  WBC 4.0 - 10.5 K/uL 7.9 8.6 10.0  Hemoglobin 13.0 - 17.0 g/dL 7.8(L) 7.6(L) 7.6(L)  Hematocrit 39.0 - 52.0 % 26.5(L) 25.1(L) 25.1(L)  Platelets 150 - 400 K/uL 243 229 242     -fe++ supp  2/22- will recheck labs 10.  History of CVA.  Aspirin Plavix as prior to admission. 11.  AKI.  Baseline creatinine 1.3.    -BMET/Cr are improving--now off IVF Given diuresis we will recheck basic metabolic panel in the morning 2/20- last Cr 1.06- 2/19- doing better 12.  Hypertension.  Blood pressure somewhat soft with hydralazine discontinued Toprol decreased to 25 mg daily to help perfusion for kidneys.   Vitals:   11/09/21 0418 11/09/21 1426  BP: (!) 142/74 140/82  Pulse: 71 67  Resp: 18 18  Temp: 99.2 F (37.3 C) 97.9 F (36.6 C)  SpO2: 92% 95%  Start torsemide  and monitor, blood pressure improved with diuretic  2/22- BP very slightly elevated- con't regimen 13.  Diabetes mellitus.  Hemoglobin A1c 7.3.  Semglee initiated while in the hospital 10 units daily.     CBG (last 3)  Recent Labs    11/09/21 0617 11/09/21 1130 11/09/21 1645  GLUCAP 115*  143* 116*    2/22- BG's controlled- con't regimen 14.  Morbid obesity.  BMI 50.22.  Dietary follow-up 15.  Hyperlipidemia.  Lipitor 16.  History of gout.  Zyloprim 300 mg daily.  Monitor for any gout flareups 17.  Acute on chronic diastolic congestive heart failure.  Still with 3+ edema LE on RIght and 4+ in the right pedal, will order foot pump , Alb low at 1.8 switched to torsemide  Filed Weights   11/07/21 0535 11/08/21 0500 11/09/21 0418  Weight: (!) 156.5 kg (!) 156 kg (!) 156.2 kg  Last 2 readings have been fairly stable continue to monitor, expect lower reading as diuresis continues, may have some downward trend over the next several days with continued diuresis. 2/20- weight down slightly from yesterday- con't regimen  2/22- weight stable- con't regimen 18. R hand swelling- suggest K tape for R hand, elevation  2/12- ktape off- but swelling almost resolved 19. Scrotal swelling- elevation/ towel--improving   2/22- is stable and chronic per pt.   I spent a total of  37  minutes on total care today- >50% coordination of care- due to  d/w PA about calling Ortho/Dr Sharol Given and assessing pics taken-      LOS: 15 days A FACE TO FACE EVALUATION WAS PERFORMED  Izyan Ezzell 11/09/2021, 6:16 PM

## 2021-11-09 NOTE — Telephone Encounter (Signed)
Please ask to take a photo of limb onc vac removed please.

## 2021-11-09 NOTE — Progress Notes (Signed)
Physical Therapy Weekly Progress Note  Patient Details  Name: [name] [name] MRN: [id number] Date of Birth: [birth year]  Beginning of progress report period: November 02, 2021 End of progress report period: November 09, 2021  Today's Date: 11/09/2021 PT Individual Time: 1032-1130; 1416-1500 PT Individual Time Calculation (min): 58 min and 44 mins  Patient has met 0 of 1 short term goals.  Patient is awaiting SNF placement for discharge. He remains grossly MaxA/TotalA for all functional mobility including bed mobility. He has been able to stand using the [employer] + MaxA x2, but it is not functional and limited to ~2s.   Patient continues to demonstrate the following deficits muscle weakness, decreased cardiorespiratoy endurance, and decreased sitting balance, decreased standing balance, decreased postural control, hemiplegia, decreased balance strategies, and difficulty maintaining precautions and therefore will continue to benefit from skilled PT intervention to increase functional independence with mobility.  Patient not progressing toward long term goals.  See goal revision..  Plan of care revisions: Patient made 15/7 due to limited active participation and progress made in therapy .  PT Short Term Goals Week 2:  PT Short Term Goal 1 (Week 2): STG= LTG based on ELOS PT Short Term Goal 1 - Progress (Week 2): Progressing toward goal Week 3:  PT Short Term Goal 1 (Week 3): STG= LTG based on ELOS  Skilled Therapeutic Interventions/Progress Updates:    Session 1: Patient received up in wc, agreeable to PT. He denies pain. PT transporting patient in wc to therapy gym for time management and energy conservation. ACE wrap on RL coming off. PT offering to re-wrap limb. Upon unwrapping ACE wrap- abd dressings found to be soaked in purulent, blood drainage with some serosanginous drainage. RN notified to observe wound and assist with wound care. Wound bed extremely macerated, slough present, active  bleeding on posterior surface. PT applying pressure to assist with mitigating bleeding. RN and PT re-dressing wound. Patient transferring to therapy mat via slideboard transfer with MaxA x2. Patient completing tricep press ups with wood handles 2x10 with prolonged rest break between. Patient transferring back to wc with [place] x2 and patient actively participating in transfer back with wooden handles. Patient returning to room in wc, seatbelt alarm on, call light within reach.   Session 2: Patient received sitting up in wc, agreeable to PT. He denies pain. Patient requesting to get back into bed. Slideboard transfer with [place] x2 and patient actively participating with wooden handles. He returned supine with ModA x1. Patient reporting that his condom catheter had recently come off and his brief needed to be changed. MinA/ModA rolling in bed for perihygiene. Patient encouraged to sit up and complete his own anterior pericare. Brief dependently placed. Patient remaining in bed, needs within reach.   Therapy Documentation Precautions:  Precautions Precautions: Fall Precaution Comments: new L BKA, pressure sores on L/R bottom, would vac Restrictions Weight Bearing Restrictions: Yes LLE Weight Bearing: Non weight bearing   Therapy/Group: Individual Therapy  Debbora Dus 11/09/2021, 7:41 AM

## 2021-11-10 LAB — CBC WITH DIFFERENTIAL/PLATELET
Abs Immature Granulocytes: 0.03 10*3/uL (ref 0.00–0.07)
Basophils Absolute: 0.1 10*3/uL (ref 0.0–0.1)
Basophils Relative: 1 %
Eosinophils Absolute: 0.6 10*3/uL — ABNORMAL HIGH (ref 0.0–0.5)
Eosinophils Relative: 9 %
HCT: 33 % — ABNORMAL LOW (ref 39.0–52.0)
Hemoglobin: 9.5 g/dL — ABNORMAL LOW (ref 13.0–17.0)
Immature Granulocytes: 1 %
Lymphocytes Relative: 12 %
Lymphs Abs: 0.8 10*3/uL (ref 0.7–4.0)
MCH: 24.5 pg — ABNORMAL LOW (ref 26.0–34.0)
MCHC: 28.8 g/dL — ABNORMAL LOW (ref 30.0–36.0)
MCV: 85.1 fL (ref 80.0–100.0)
Monocytes Absolute: 0.8 10*3/uL (ref 0.1–1.0)
Monocytes Relative: 12 %
Neutro Abs: 4.3 10*3/uL (ref 1.7–7.7)
Neutrophils Relative %: 65 %
Platelets: 176 10*3/uL (ref 150–400)
RBC: 3.88 MIL/uL — ABNORMAL LOW (ref 4.22–5.81)
RDW: 22.5 % — ABNORMAL HIGH (ref 11.5–15.5)
WBC: 6.5 10*3/uL (ref 4.0–10.5)
nRBC: 0 % (ref 0.0–0.2)

## 2021-11-10 LAB — COMPREHENSIVE METABOLIC PANEL
ALT: 16 U/L (ref 0–44)
AST: 14 U/L — ABNORMAL LOW (ref 15–41)
Albumin: 2.6 g/dL — ABNORMAL LOW (ref 3.5–5.0)
Alkaline Phosphatase: 62 U/L (ref 38–126)
Anion gap: 8 (ref 5–15)
BUN: 17 mg/dL (ref 8–23)
CO2: 32 mmol/L (ref 22–32)
Calcium: 8.4 mg/dL — ABNORMAL LOW (ref 8.9–10.3)
Chloride: 99 mmol/L (ref 98–111)
Creatinine, Ser: 1.21 mg/dL (ref 0.61–1.24)
GFR, Estimated: >60 mL/min (ref >60)
Glucose, Bld: 138 mg/dL — ABNORMAL HIGH (ref 70–99)
Potassium: 3.9 mmol/L (ref 3.5–5.1)
Sodium: 139 mmol/L (ref 135–145)
Total Bilirubin: 0.6 mg/dL (ref 0.3–1.2)
Total Protein: 6.2 g/dL — ABNORMAL LOW (ref 6.5–8.1)

## 2021-11-10 LAB — GLUCOSE, CAPILLARY
Glucose-Capillary: 139 mg/dL — ABNORMAL HIGH (ref 70–99)
Glucose-Capillary: 128 mg/dL — ABNORMAL HIGH (ref 70–99)
Glucose-Capillary: 150 mg/dL — ABNORMAL HIGH (ref 70–99)
Glucose-Capillary: 152 mg/dL — ABNORMAL HIGH (ref 70–99)

## 2021-11-10 NOTE — Progress Notes (Signed)
Occupational Therapy Session Note  Patient Details  Name: [name] [name] MRN: [id number] Date of Birth: 1955-11-21  Today's Date: 11/10/2021 OT Individual Time: 1106-1206 OT Individual Time Calculation (min): 60 min    Short Term Goals: Week 2:  OT Short Term Goal 1 (Week 2): STGs = modified LTGs     Skilled Therapeutic Interventions/Progress Updates:    Pt received in bed. Worked on bed mobility and forward trunk flexion to allow pt to move more independently and be able to A caregivers more with LB dressing. He did demonstrate improved effort and ability to come into a full sit position in the bed.  Used maxi sky to transfer to wc. Pt able to scoot his hips back more effectively today using his arms.  Pt worked on tricep chair push ups 5 x3 with 2 sec isometric holds  Pt transported to gym to start a slide board to mat, but halfway through transfer pt felt he could not move well and felt his was sliding so quickly got back into his wc and opted to not try transfer a 2nd time.  Pt worked on BUE AROM using hula hoop in B hands with 2# wt on L arm and 4# on R arm to due various movements using the hoop to focus on R arm strengthening of hemiplegic side.    Pt asked to self propel his wc. Due to limited arm strength only able to move about 15 ft forward on his own.  Pt set up in his room with belt alarm on to prepare for lunch.    Therapy Documentation Precautions:  Precautions Precautions: Fall Precaution Comments: new L BKA, pressure sores on L/R bottom, would vac Restrictions Weight Bearing Restrictions: Yes LLE Weight Bearing: Non weight bearing  Pain: no c/o pain   ADL: ADL Eating: Independent Grooming: Setup Upper Body Bathing: Setup Where Assessed-Upper Body Bathing: Bed level Lower Body Bathing: Moderate assistance Where Assessed-Lower Body Bathing: Bed level Upper Body Dressing: Minimal assistance Where Assessed-Upper Body Dressing: Bed level Lower Body Dressing:  Moderate assistance Where Assessed-Lower Body Dressing: Bed level Toileting: Not assessed Toilet Transfer: Other (comment) (+ 3 with slide board, but then unable to wt shift well enough to manage clothing and cleansing) Tub/Shower Transfer: Not assessed     Therapy/Group: Individual Therapy  [facility] 11/10/2021, 1:14 PM

## 2021-11-10 NOTE — Progress Notes (Signed)
Pt's bandage on right leg was soiled from drainage.  This LPN changed bandage with ABD pad and Kerlex.

## 2021-11-10 NOTE — Progress Notes (Signed)
PROGRESS NOTE   Subjective/Complaints:   L BKA tender, but not painful per pt.  Little BM last night. Scrotal edema stable. Is "Normal" for him.   ROS:   Pt denies SOB, abd pain, CP, N/V/C/D, and vision changes     Objective:   No results found. Recent Labs    11/10/21 0654  WBC 6.5  HGB 9.5*  HCT 33.0*  PLT 176     Recent Labs    11/10/21 0654  NA 139  K 3.9  CL 99  CO2 32  GLUCOSE 138*  BUN 17  CREATININE 1.21  CALCIUM 8.4*     Intake/Output Summary (Last 24 hours) at 11/10/2021 0951 Last data filed at 11/09/2021 2030 Gross per 24 hour  Intake 840 ml  Output 425 ml  Net 415 ml     Pressure Injury 10/15/21 Buttocks Right;Left Stage 2 -  Partial thickness loss of dermis presenting as a shallow open injury with a red, pink wound bed without slough. patchy areas of partial thickness skin loss related to moiture associated skin damage (Active)  10/15/21 1635  Location: Buttocks  Location Orientation: Right;Left  Staging: Stage 2 -  Partial thickness loss of dermis presenting as a shallow open injury with a red, pink wound bed without slough.  Wound Description (Comments): patchy areas of partial thickness skin loss related to moiture associated skin damage  Present on Admission: Yes    Physical Exam: Vital Signs Blood pressure (!) 151/99, pulse 77, temperature 98.5 F (36.9 C), temperature source Oral, resp. rate 18, height 5\' 10"  (1.778 m), weight (!) 153 kg, SpO2 97 %.      General: awake, alert, appropriate, sitting up in bed as usual on buttocks; not supine; NT undressing L BKA for me; NAD HENT: conjugate gaze; oropharynx moist CV: regular rate; no JVD Pulmonary: CTA B/L; no W/R/R- good air movement GI: soft, NT, ND, (+)BS Psychiatric: appropriate- flat; vague Neurological: alert-   Ext: 3+ edema right pedal and pretibial, no change on RLE; wound VAC off- L BKA looks like top layers  of skin gone around entire tip- however some wrinkles c/w less swelling- no dog ears;   GU: scrotal edema stable- per pt is "normal for him" Skin:  RLE venous stasis wound, chronic skin changes. Old crusted wounds near prior amp sites B/L  buttocks wounds- always in same position- sitting up with most pressure on buttocks on phone in AM Neuro:  Alert and oriented x 3. Normal insight and awareness. Intact Memory. Normal language and speech. Cranial nerve exam unremarkable. Decreased sensation distal RLE.  Normal strength left l upper extremity has 4- /5 strength UE. RLE 3/5 prox to4/5 distally. No abnl tone Musculoskeletal: left stump in vac, swollen, somewhat tender    First pic 2/22 and 2nd pic 2/23  Assessment/Plan: 1. Functional deficits which require 3+ hours per day of interdisciplinary therapy in a comprehensive inpatient rehab setting. Physiatrist is providing close team supervision and 24 hour management of active medical problems listed below. Physiatrist and rehab team continue to assess barriers to discharge/monitor patient progress toward functional and medical goals  Care Tool:  Bathing    Body parts  bathed by patient: Right arm, Left arm, Chest, Abdomen, Right upper leg, Left upper leg, Face   Body parts bathed by helper: Front perineal area, Buttocks, Left lower leg, Right lower leg     Bathing assist Assist Level: Moderate Assistance - Patient 50 - 74%     Upper Body Dressing/Undressing Upper body dressing   What is the patient wearing?: Pull over shirt    Upper body assist Assist Level: Minimal Assistance - Patient > 75%    Lower Body Dressing/Undressing Lower body dressing      What is the patient wearing?: Pants     Lower body assist Assist for lower body dressing: Moderate Assistance - Patient 50 - 74% (bed level)     Toileting Toileting    Toileting assist Assist for toileting: Dependent - Patient 0%     Transfers Chair/bed transfer  Transfers  assist     Chair/bed transfer assist level: Dependent - mechanical lift     Locomotion Ambulation   Ambulation assist   Ambulation activity did not occur: Safety/medical concerns          Walk 10 feet activity   Assist  Walk 10 feet activity did not occur: Safety/medical concerns        Walk 50 feet activity   Assist Walk 50 feet with 2 turns activity did not occur: Safety/medical concerns         Walk 150 feet activity   Assist Walk 150 feet activity did not occur: Safety/medical concerns         Walk 10 feet on uneven surface  activity   Assist Walk 10 feet on uneven surfaces activity did not occur: Safety/medical concerns         Wheelchair     Assist Is the patient using a wheelchair?: Yes Type of Wheelchair: Manual    Wheelchair assist level: Moderate Assistance - Patient 50 - 74% Max wheelchair distance: 150    Wheelchair 50 feet with 2 turns activity    Assist        Assist Level: Moderate Assistance - Patient 50 - 74%   Wheelchair 150 feet activity     Assist      Assist Level: Moderate Assistance - Patient 50 - 74%   Blood pressure (!) 151/99, pulse 77, temperature 98.5 F (36.9 C), temperature source Oral, resp. rate 18, height 5\' 10"  (1.778 m), weight (!) 153 kg, SpO2 97 %.  Medical Problem List and Plan: 1. 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   2/23- Dr Sharol Given is off- so asked Ortho to have someone come assess pt's L BKA- wasn't seen yesterday. Con't PT, OT CIR- 15/7 2.  Antithrombotics: -DVT/anticoagulation:  Mechanical: Antiembolism stockings, thigh (TED hose) Right lower extremity             -antiplatelet therapy: Aspirin 81 mg daily and Plavix 75 mg  daily    3. Pain Management: Hydrocodone as needed -continue gabapentin for phantom limb pain, 100mg  bid 2/23- pain contorlled- just tender- con't regimen 4. Mood: Provide emotional support             -  antipsychotic agents: N/A 5. Neuropsych: This patient is capable of making decisions on his own behalf. 6. Skin/Wound Care: Routine skin checks.  MASD on buttocks and lymphedema/venous stasis ulcers on RLE- was told Pressure ulcer on buttocks, but not per WOC or nursing.   WOC follow-up wound care as directed.  -  2/19 continue wound VAC until drainage tapers, would touch base with Ortho in regards to this prior to discontinuation  2/22- VAC removed- L BKA doesn't appear great, but will let Dr Sharol Given assess viability  2/23- waiting for Ortho to come assess L BKA- wasn't able ot see yesterday 7. Fluids/Electrolytes/Nutrition: Routine in and outs with follow-up chemistries 8.  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  Status post EGD and colonoscopy 10/19/2021.  Continue PPI x8 weeks. 9.  Acute blood loss anemia. Stable 2/17  2/22- Hb up to 9.5- no leukocytosis CBC Latest Ref Rng & Units 11/10/2021 10/31/2021 10/25/2021  WBC 4.0 - 10.5 K/uL 6.5 7.9 8.6  Hemoglobin 13.0 - 17.0 g/dL 9.5(L) 7.8(L) 7.6(L)  Hematocrit 39.0 - 52.0 % 33.0(L) 26.5(L) 25.1(L)  Platelets 150 - 400 K/uL 176 243 229     -fe++ supp  2/22- will recheck labs 10.  History of CVA.  Aspirin Plavix as prior to admission. 11.  AKI.  Baseline creatinine 1.3.    -BMET/Cr are improving--now off IVF Given diuresis we will recheck basic metabolic panel in the morning 2/20- last Cr 1.06- 2/19- doing better 2/23- Cr 1.21 and BUN17- doing better- baseline Cr 1.3 12.  Hypertension.  Blood pressure somewhat soft with hydralazine discontinued Toprol decreased to 25 mg daily to help perfusion for kidneys.   Vitals:   11/09/21 1931 11/10/21 0451  BP: (!) 152/92 (!) 151/99   Pulse: 70 77  Resp: 18 18  Temp: 97.6 F (36.4 C) 98.5 F (36.9 C)  SpO2: 93% 97%  Start torsemide  and monitor, blood pressure improved with diuretic  2/22- BP very slightly elevated- con't regimen  13.  Diabetes mellitus.  Hemoglobin A1c 7.3.  Semglee initiated while in the hospital 10 units daily.     CBG (last 3)  Recent Labs    11/09/21 1645 11/09/21 2112 11/10/21 0606  GLUCAP 116* 130* 139*    2/23- CBGs controlled con't regimen 14.  Morbid obesity.  BMI 50.22.  Dietary follow-up 15.  Hyperlipidemia.  Lipitor 16.  History of gout.  Zyloprim 300 mg daily.  Monitor for any gout flareups 17.  Acute on chronic diastolic congestive heart failure.  Still with 3+ edema LE on RIght and 4+ in the right pedal, will order foot pump , Alb low at 1.8 switched to torsemide  Filed Weights   11/08/21 0500 11/09/21 0418 11/10/21 0544  Weight: (!) 156 kg (!) 156.2 kg (!) 153 kg  Last 2 readings have been fairly stable continue to monitor, expect lower reading as diuresis continues, may have some downward trend over the next several days with continued diuresis. 2/20- weight down slightly from yesterday- con't regimen 2/23- Weight down 3 kg- not sure if correct, could be VAC being removed? 18. R hand swelling- suggest K tape for R hand, elevation  2/12- ktape off- but swelling almost resolved 19. Scrotal swelling- elevation/ towel--improving   2/22- is stable and chronic per pt. 2/23- size of grapefruit, but chronic   I spent a total of  40   minutes on total care today- >50% coordination of care- due  to calling Ortho office and d/w PA and assessing L BKA with nursing.     LOS: 16 days A FACE TO FACE EVALUATION WAS PERFORMED  Gigi Onstad 11/10/2021, 9:51 AM

## 2021-11-10 NOTE — Progress Notes (Signed)
Physical Therapy Session Note  Patient Details  Name: [name] [name] MRN: [id number] Date of Birth: 1956/04/27  Today's Date: 11/10/2021 PT Individual Time: 0805-0900 PT Individual Time Calculation (min): 55 min   Short Term Goals: Week 1:  PT Short Term Goal 1 (Week 1): Patient will maintain static sitting balance EOB for >/= 28mns with no more than MinA PT Short Term Goal 1 - Progress (Week 1): Met PT Short Term Goal 2 (Week 1): Patient will transfer bed <> wc with LRAD and MaxA x1 PT Short Term Goal 2 - Progress (Week 1): Not met PT Short Term Goal 3 (Week 1): Patient will propel himself at least 533fwith no more than MinA PT Short Term Goal 3 - Progress (Week 1): Not met Week 2:  PT Short Term Goal 1 (Week 2): STG= LTG based on ELOS PT Short Term Goal 1 - Progress (Week 2): Progressing toward goal Week 3:  PT Short Term Goal 1 (Week 3): STG= LTG based on ELOS   Skilled Therapeutic Interventions/Progress Updates:   Pt received supine in bed and agreeable to PT. NT present for dressing removal. PT assisted with Limb management and placement of dressing for MD and RN to perform assessment. Pt able to performed SLR 5 x 1-2 min for PT and medical team to assess wound and don dressing.   PT instructed pt in rolling R and L x 2 with min assist in both directions to doff/don soiled brief.    Supine therex for BLE/BUE: SLR, hip abduction. Chest press, mid row with HOB elevated. Each performed 3 x 12 with AAROM on the RLE for SLR and manual resistance for hip adduction/adduction. 7# bar weight for chest press and red tband to row. Cues for full ROM and short hold at end range.   Pt left supine in bed with call bell in reach and all needs me.t      Therapy Documentation Precautions:  Precautions Precautions: Fall Precaution Comments: new L BKA, pressure sores on L/R bottom, would vac Restrictions Weight Bearing Restrictions: Yes LLE Weight Bearing: Non weight bearing  Pain: Pain  Assessment Pain Scale: 0-10 Pain Score: 0-No pain   Therapy/Group: Individual Therapy  AuLorie Phenix/23/2023, 9:02 AM

## 2021-11-10 NOTE — Progress Notes (Signed)
Physical Therapy Session Note  Patient Details  Name: [name] [name] MRN: [id number] Date of Birth: 04-19-1956  Today's Date: 11/10/2021 PT Individual Time: 1300-1330 PT Individual Time Calculation (min): 30 min   Short Term Goals: Week 1:  PT Short Term Goal 1 (Week 1): Patient will maintain static sitting balance EOB for >/= 59mns with no more than MinA PT Short Term Goal 1 - Progress (Week 1): Met PT Short Term Goal 2 (Week 1): Patient will transfer bed <> wc with LRAD and MaxA x1 PT Short Term Goal 2 - Progress (Week 1): Not met PT Short Term Goal 3 (Week 1): Patient will propel himself at least 545fwith no more than MinA PT Short Term Goal 3 - Progress (Week 1): Not met Week 2:  PT Short Term Goal 1 (Week 2): STG= LTG based on ELOS PT Short Term Goal 1 - Progress (Week 2): Progressing toward goal Week 3:  PT Short Term Goal 1 (Week 3): STG= LTG based on ELOS  Skilled Therapeutic Interventions/Progress Updates:    Pt initiallyt oob in wc Transported to gym for session w/focus on core strengthening.  Seated alternating forward reach to target to encourage abdominal activation and ant wt shifts  Repeated w/alternating crossbody reach to target.  Seated w/upright posture worked on dual shoulder extension using red theraband secured overhead "pulldowns" Repeated w/alternating pulldowns + trunk rotation  Seated forward/overhead reach using wall ladder to encourage increased excursions/targets.  Boosting in wc repeated mult efforts.  At end of session, Pt left oob in wc w/alarm belt set and needs in reach   Therapy Documentation Precautions:  Precautions Precautions: Fall Precaution Comments: new L BKA, pressure sores on L/R bottom, would vac Restrictions Weight Bearing Restrictions: Yes LLE Weight Bearing: Non weight bearing    Therapy/Group: Individual Therapy BaCallie FieldingPTKenwood/23/2023, 1:30 PM

## 2021-11-11 LAB — GLUCOSE, CAPILLARY
Glucose-Capillary: 124 mg/dL — ABNORMAL HIGH (ref 70–99)
Glucose-Capillary: 119 mg/dL — ABNORMAL HIGH (ref 70–99)
Glucose-Capillary: 118 mg/dL — ABNORMAL HIGH (ref 70–99)
Glucose-Capillary: 145 mg/dL — ABNORMAL HIGH (ref 70–99)

## 2021-11-11 NOTE — Progress Notes (Signed)
Physical Therapy Session Note  Patient Details  Name: [name] [name] MRN: [id number] Date of Birth: 06-26-1956  Today's Date: 11/11/2021 PT Individual Time: 1432-1528 PT Individual Time Calculation (min): 56 min   Short Term Goals: Week 3:  PT Short Term Goal 1 (Week 3): STG= LTG based on ELOS  Skilled Therapeutic Interventions/Progress Updates:    Patient received sitting up in wc, agreeable to PT. He denies pain, but reports N/T in L RL. Shrinker sock currently donned, but rolled down just below knee. PT attempting to adjust shrinker sock, but unable to keep it from rolling. Moderate drainage noted to R LE dressing. He was able to propel himself ~7ft in wc with supervision and required assist the remainder of the way to the therapy gym. Patient completed 3x10 LAQ, ankle pumps R LE with great difficulty and rest breaks between. Patient completing the following UE exercises with red theraband: scap retraction, punches 3x10 with rest breaks between. He returned to his room in wc. Slideboard transfer back to bed MaxA x2 with patient assisting as able using press up handle. Returned supine with MaxA. Patient remaining up in bed, 4 rails up, needs within reach.   Therapy Documentation Precautions:  Precautions Precautions: Fall Precaution Comments: new L BKA, pressure sores on L/R bottom, would vac Restrictions Weight Bearing Restrictions: Yes LLE Weight Bearing: Non weight bearing     Therapy/Group: Individual Therapy  Karoline Caldwell, PT, DPT, CBIS  11/11/2021, 7:32 AM

## 2021-11-11 NOTE — Progress Notes (Signed)
PROGRESS NOTE   Subjective/Complaints:  Pt reports someone changed the bandage and took pics, however doesn't remember that NP or surgeon came to see him so far.   No note found from Ortho.  Slept OK- LBM yesterday- Doesn't know how much. Per documentation, was large. Last night.    ROS:   Pt denies SOB, abd pain, CP, N/V/C/D, and vision changes      Objective:   No results found. Recent Labs    11/10/21 0654  WBC 6.5  HGB 9.5*  HCT 33.0*  PLT 176     Recent Labs    11/10/21 0654  NA 139  K 3.9  CL 99  CO2 32  GLUCOSE 138*  BUN 17  CREATININE 1.21  CALCIUM 8.4*     Intake/Output Summary (Last 24 hours) at 11/11/2021 0831 Last data filed at 11/11/2021 0753 Gross per 24 hour  Intake 1200 ml  Output 1900 ml  Net -700 ml     Pressure Injury 10/15/21 Buttocks Right;Left Stage 2 -  Partial thickness loss of dermis presenting as a shallow open injury with a red, pink wound bed without slough. patchy areas of partial thickness skin loss related to moiture associated skin damage (Active)  10/15/21 1635  Location: Buttocks  Location Orientation: Right;Left  Staging: Stage 2 -  Partial thickness loss of dermis presenting as a shallow open injury with a red, pink wound bed without slough.  Wound Description (Comments): patchy areas of partial thickness skin loss related to moiture associated skin damage  Present on Admission: Yes    Physical Exam: Vital Signs Blood pressure (!) 147/87, pulse 67, temperature 98 F (36.7 C), resp. rate 18, height 5\' 10"  (1.778 m), weight (!) 153 kg, SpO2 93 %.       General: awake, alert, appropriate,- lackadaisical;  NAD HENT: conjugate gaze; oropharynx moist CV: regular rate; no JVD Pulmonary: CTA B/L; no W/R/R- good air movement GI: soft, NT, ND, (+)BS Psychiatric: appropriate Neurological: alert Ext: 3+ edema right pedal and pretibial, no change on RLE; wound  VAC off- L BKA looks like top layers of skin gone around entire tip- however some wrinkles c/w less swelling- no dog ears;  wrapped this AM- C/D/I with ACE wrap GU: scrotal edema stable- per pt is "normal for him" Skin:  RLE venous stasis wound, chronic skin changes. Old crusted wounds near prior amp sites B/L  buttocks wounds- always in same position- sitting up with most pressure on buttocks on phone in AM Neuro:  Alert and oriented x 3. Normal insight and awareness. Intact Memory. Normal language and speech. Cranial nerve exam unremarkable. Decreased sensation distal RLE.  Normal strength left l upper extremity has 4- /5 strength UE. RLE 3/5 prox to4/5 distally. No abnl tone Musculoskeletal: left stump in vac, swollen, somewhat tender    First pic 2/22 and 2nd pic 2/23  Assessment/Plan: 1. Functional deficits which require 3+ hours per day of interdisciplinary therapy in a comprehensive inpatient rehab setting. Physiatrist is providing close team supervision and 24 hour management of active medical problems listed below. Physiatrist and rehab team continue to assess barriers to discharge/monitor patient progress toward functional and medical  goals  Care Tool:  Bathing    Body parts bathed by patient: Right arm, Left arm, Chest, Abdomen, Right upper leg, Left upper leg, Face   Body parts bathed by helper: Front perineal area, Buttocks, Left lower leg, Right lower leg     Bathing assist Assist Level: Moderate Assistance - Patient 50 - 74%     Upper Body Dressing/Undressing Upper body dressing   What is the patient wearing?: Pull over shirt    Upper body assist Assist Level: Minimal Assistance - Patient > 75%    Lower Body Dressing/Undressing Lower body dressing      What is the patient wearing?: Pants     Lower body assist Assist for lower body dressing: Moderate Assistance - Patient 50 - 74% (bed level)     Toileting Toileting    Toileting assist Assist for toileting:  Dependent - Patient 0%     Transfers Chair/bed transfer  Transfers assist     Chair/bed transfer assist level: Dependent - mechanical lift     Locomotion Ambulation   Ambulation assist   Ambulation activity did not occur: Safety/medical concerns          Walk 10 feet activity   Assist  Walk 10 feet activity did not occur: Safety/medical concerns        Walk 50 feet activity   Assist Walk 50 feet with 2 turns activity did not occur: Safety/medical concerns         Walk 150 feet activity   Assist Walk 150 feet activity did not occur: Safety/medical concerns         Walk 10 feet on uneven surface  activity   Assist Walk 10 feet on uneven surfaces activity did not occur: Safety/medical concerns         Wheelchair     Assist Is the patient using a wheelchair?: Yes Type of Wheelchair: Manual    Wheelchair assist level: Moderate Assistance - Patient 50 - 74% Max wheelchair distance: 150    Wheelchair 50 feet with 2 turns activity    Assist        Assist Level: Moderate Assistance - Patient 50 - 74%   Wheelchair 150 feet activity     Assist      Assist Level: Moderate Assistance - Patient 50 - 74%   Blood pressure (!) 147/87, pulse 67, temperature 98 F (36.7 C), resp. rate 18, height 5\' 10"  (1.778 m), weight (!) 153 kg, SpO2 93 %.  Medical Problem List and Plan: 1. 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  Antithrombotics: -DVT/anticoagulation:  Mechanical: Antiembolism stockings, thigh (TED hose) Right lower extremity             -antiplatelet therapy: Aspirin 81 mg daily and Plavix 75 mg daily    3. Pain Management: Hydrocodone as needed -continue gabapentin for phantom limb pain, 100mg  bid 2/24- Pain controlled-  con't regimen 4. Mood: Provide emotional support             -antipsychotic agents: N/A 5. Neuropsych: This patient is capable of making decisions on his own behalf. 6. Skin/Wound Care: Routine skin checks.  MASD on buttocks and lymphedema/venous  stasis ulcers on RLE- was told Pressure ulcer on buttocks, but not per WOC or nursing.   WOC follow-up wound care as directed.  Wound VAC off  2/24- waiting to see Ortho- will con't wound care 7. Fluids/Electrolytes/Nutrition: Routine in and outs with follow-up chemistries 8.  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  Status post EGD and colonoscopy 10/19/2021.  Continue PPI x8 weeks. 9.  Acute blood loss anemia. Stable 2/17  2/22- Hb up to 9.5- no leukocytosis CBC Latest Ref Rng & Units 11/10/2021 10/31/2021 10/25/2021  WBC 4.0 - 10.5 K/uL 6.5 7.9 8.6  Hemoglobin 13.0 - 17.0 g/dL 9.5(L) 7.8(L) 7.6(L)  Hematocrit 39.0 - 52.0 % 33.0(L) 26.5(L) 25.1(L)  Platelets 150 - 400 K/uL 176 243 229     -fe++ supp  2/22- will recheck labs 10.  History of CVA.  Aspirin Plavix as prior to admission. 11.  AKI.  Baseline creatinine 1.3.    -BMET/Cr are improving--now off IVF Given diuresis we will recheck basic metabolic panel in the morning 2/20- last Cr 1.06- 2/19- doing better 2/23- Cr 1.21 and BUN17- doing better- baseline Cr 1.3 12.  Hypertension.  Blood pressure somewhat soft with hydralazine discontinued Toprol decreased to 25 mg daily to help perfusion for kidneys.   Vitals:   11/10/21 2007 11/11/21 0530  BP: (!) 151/92 (!) 147/87  Pulse: 69 67  Resp: 19 18  Temp: 98.3 F (36.8 C) 98 F (36.7 C)  SpO2: 93% 93%  Start torsemide  and monitor, blood pressure improved with diuretic  2/24- BP slightly elevated- but will monitor for trend 13.  Diabetes mellitus.  Hemoglobin A1c 7.3.  Semglee initiated while in the hospital 10 units daily.     CBG (last 3)  Recent Labs    11/10/21 1632  11/10/21 2151 11/11/21 0559  GLUCAP 150* 152* 124*    2/23- CBGs controlled con't regimen 14.  Morbid obesity.  BMI 50.22.  Dietary follow-up 15.  Hyperlipidemia.  Lipitor 16.  History of gout.  Zyloprim 300 mg daily.  Monitor for any gout flareups 17.  Acute on chronic diastolic congestive heart failure.  Still with 3+ edema LE on RIght and 4+ in the right pedal, will order foot pump , Alb low at 1.8 switched to torsemide  Filed Weights   11/09/21 0418 11/10/21 0544 11/11/21 0530  Weight: (!) 156.2 kg (!) 153 kg (!) 153 kg  Last 2 readings have been fairly stable continue to monitor, expect lower reading as diuresis continues, may have some downward trend over the next several days with continued diuresis.  2/24- Weight down to 153 kg- which is better 18. R hand swelling- suggest K tape for R hand, elevation  2/12- ktape off- but swelling almost resolved 19. Scrotal swelling- elevation/ towel--improving   2/22- is stable and chronic per pt. 2/23- size of grapefruit, but chronic   I spent a total of 35   minutes on total care today- >50% coordination of care- due to d/w PA and nursing about care- will wait to hear Ortho's thoughts     LOS: 17 days A FACE TO FACE EVALUATION WAS PERFORMED  Ekaterini Capitano 11/11/2021, 8:31 AM

## 2021-11-11 NOTE — Progress Notes (Signed)
Occupational Therapy Weekly Progress Note  Patient Details  Name: [name] [name] MRN: [id number] Date of Birth: 1956-04-30  Beginning of progress report period: November 03, 2021 End of progress report period: November 11, 2021  Today's Date: 11/11/2021 OT Individual Time: 2993-7169 OT Individual Time Calculation (min): 58 min    Pt is continuing to work towards his LTGs that were downgraded last week. He is progressing with his bed mobility skills and UE strength to be able to scoot himself back into wc seat.   Patient continues to demonstrate the following deficits: muscle weakness and muscle joint tightness, decreased cardiorespiratoy endurance, and decreased sitting balance, decreased postural control, and decreased balance strategies and therefore will continue to benefit from skilled OT intervention to enhance overall performance with BADL and Reduce care partner burden.  Patient progressing toward long term goals..  Continue plan of care.  OT Short Term Goals Week 1:  OT Short Term Goal 1 (Week 1): Pt will complete BSC/toilet transfer with Mod x2 OT Short Term Goal 1 - Progress (Week 1): Not progressing OT Short Term Goal 2 (Week 1): Pt will perform LB dress with Max A with AE PRN OT Short Term Goal 2 - Progress (Week 1): Progressing toward goal OT Short Term Goal 3 (Week 1): Pt will participate in task of choice/ADL for 10 minutes without rest break OT Short Term Goal 3 - Progress (Week 1): Progressing toward goal Week 2:  OT Short Term Goal 1 (Week 2): STGs = modified LTGs OT Short Term Goal 1 - Progress (Week 2): Progressing toward goal Week 3:  OT Short Term Goal 1 (Week 3): continuation of working towards LTGs - LOS extended due to delay in SNF placement  Skilled Therapeutic Interventions/Progress Updates:    Pt received in wc.  Seen this session to focus on core and UE strength.  Using a 2# med ball pt worked on single arm extensions and ext rotation. B hands on ball for  forward reaches and modified "kettle bell swings"  LE AROM focusing on knee ext and hip adduction.  Pt slides forward in his wc seat so he had to do full tricep pushups to move hips back numerous times in session and was able to scoot back fully without my A.  Made R leg rest 1 notch shorted so he could push back more easily.  Pt resting in wc with tray table set up for his lunch.  All needs met.   Therapy Documentation Precautions:  Precautions Precautions: Fall Precaution Comments: new L BKA, pressure sores on L/R bottom, would vac Restrictions Weight Bearing Restrictions: Yes LLE Weight Bearing: Non weight bearing    Vital Signs: Therapy Vitals Temp: 98 F (36.7 C) Pulse Rate: 67 Resp: 18 BP: (!) 147/87 Patient Position (if appropriate): Sitting Oxygen Therapy SpO2: 93 % O2 Device: Room Air Pain:   Tingling sensation in L limb ADL: ADL Eating: Independent Grooming: Setup Upper Body Bathing: Setup Where Assessed-Upper Body Bathing: Bed level Lower Body Bathing: Moderate assistance Where Assessed-Lower Body Bathing: Bed level Upper Body Dressing: Minimal assistance Where Assessed-Upper Body Dressing: Bed level Lower Body Dressing: Moderate assistance Where Assessed-Lower Body Dressing: Bed level Toileting: Not assessed Toilet Transfer: Other (comment) (+ 3 with slide board, but then unable to wt shift well enough to manage clothing and cleansing) Tub/Shower Transfer: Not assessed   Therapy/Group: Individual Therapy  Kynzie Polgar 11/11/2021, 8:28 AM

## 2021-11-11 NOTE — Progress Notes (Signed)
Patient ID: [name] [name], male   DOB: [birth year], 66 y.o.   MRN: [id number]  Met with pt and while girlfriend on telephone to discuss needing to expand the NH search, pt is interested in [place] area and girlfriend wants to look at the place in [place] where she lives.  Will send out further.

## 2021-11-11 NOTE — Progress Notes (Signed)
Occupational Therapy Session Note  Patient Details  Name: [name] [name] MRN: [id number] Date of Birth: 07-Dec-1955  Today's Date: 11/11/2021 OT Individual Time: [phone] OT Individual Time Calculation (min): 54 min    Short Term Goals: Week 2:  OT Short Term Goal 1 (Week 2): STGs = modified LTGs OT Short Term Goal 1 - Progress (Week 2): Progressing toward goal  Skilled Therapeutic Interventions/Progress Updates:  Pt greeted supine in bed  agreeable to OT intervention. Session focus on BADL reeducation, functional mobility, BUE strengthening/ endurance, increasing overall activity tolerance and decreasing overall caregiver burden.   Pt completed dressing from bed level with MAX A for UB and LB dressing to don pants and shirt from bed level. Pt rolling R and L with MOD A to pull pants up to waist line, MOD A to shift trunk forward to pull shirt down with BUEs positioned on bed rail. Dependent lift transfer to w/c with maxi sky, pt needed +2 MAX A to scoot hips back in w/c with use of bed pad and sling. Pt completed ~ 10 ft of w/c propulsion with supervision with pt needed total A transport remainder of distance for energy conservation. Pt completed below therex with 4lb weighted ball to increase BUE strength and endurance for higher level functional mobility tasks:  2X10 chest presses 2X10 bicep curls 2X10 OH pressses 2X10 shoulder flexion in PNF pattern R and L   VSS on RA.  Pt abel to complete x5 w/c pushups before reporting fatigue, total A transport back to room where pt left seated in w/c with alarm belt activated and all needs within reach.                       Therapy Documentation Precautions:  Precautions Precautions: Fall Precaution Comments: new L BKA, pressure sores on L/R bottom, would vac Restrictions Weight Bearing Restrictions: Yes LLE Weight Bearing: Non weight bearing  Pain: no pain reported during session    Therapy/Group: Individual Therapy  Corinne Ports  [facility] [address] 11/11/2021, 9:55 AM

## 2021-11-11 NOTE — Consult Note (Signed)
Post-Op Note   Patient: [name] [name]           Date of Birth: 07-28-1956           MRN: [id number] Visit Date: 11/11/21 PCP: Manon Hilding, MD  Chief Complaint: No chief complaint on file.   HPI:  HPI The patient is a 66 year old gentleman who is seen today status post left below-knee amputation.  His wound VAC has been removed.  He is currently residing in inpatient rehab.  They are doing dry dressing changes with gauze and Ace wrap.  There is concern of excessive drainage Ortho Exam On examination of the left residual limb he has significant edema with induration up to the lower thigh.  There is mild erythema.  The incision is well-healed medially and laterally it has not yet healed.  There is no current dehiscence.  There is surrounding maceration and dermatitis.  There is no active drainage  To the medial knee and distal thigh he does have a skin tear this is 3 cm in diameter and superficial filled in with granulation there is weeping serous drainage.  No foul odor.  Visit Diagnoses: Left below-knee amputation  Plan: Continue daily Dial soap cleansing and dry dressing changes to the incision.  Today dry dressing with gauze and Ace wrap was applied his 4 XL shrinker was located and applied.  Please continue the shrinker around-the-clock except for with hygiene.  Follow-Up Instructions: No follow-ups on file.   Imaging: No results found.  Orders:  Orders Placed This Encounter  Procedures   Comprehensive metabolic panel   CBC WITH DIFFERENTIAL   Glucose, capillary   Glucose, capillary   Glucose, capillary   Basic metabolic panel   Glucose, capillary   Glucose, capillary   Glucose, capillary   Glucose, capillary   Basic metabolic panel   Glucose, capillary   Glucose, capillary   Glucose, capillary   Glucose, capillary   Glucose, capillary   CBC   Basic metabolic panel   Glucose, capillary   Glucose, capillary   Glucose, capillary   Glucose, capillary    Glucose, capillary   Glucose, capillary   Glucose, capillary   Glucose, capillary   Glucose, capillary   Glucose, capillary   Glucose, capillary   Glucose, capillary   Glucose, capillary   Glucose, capillary   Glucose, capillary   Glucose, capillary   Glucose, capillary   Glucose, capillary   Glucose, capillary   Glucose, capillary   Glucose, capillary   Glucose, capillary   Glucose, capillary   Glucose, capillary   Glucose, capillary   Glucose, capillary   Glucose, capillary   Glucose, capillary   Glucose, capillary   Glucose, capillary   Glucose, capillary   Basic metabolic panel   Glucose, capillary   Glucose, capillary   Glucose, capillary   Glucose, capillary   Glucose, capillary   Glucose, capillary   Glucose, capillary   Glucose, capillary   Glucose, capillary   Glucose, capillary   Glucose, capillary   Glucose, capillary   Glucose, capillary   Glucose, capillary   Glucose, capillary   Glucose, capillary   Glucose, capillary   Glucose, capillary   Comprehensive metabolic panel   CBC with Differential/Platelet   Glucose, capillary   Glucose, capillary   Glucose, capillary   Glucose, capillary   Glucose, capillary   Glucose, capillary   Diet Carb Modified Fluid consistency: Thin; Room service appropriate? Yes   Intake and output   Negative  Pressure Wound Therapy - Incisional   Vital signs   Notify physician (specify) if oral intake is less than 1000 ml within 24 hours   Assess, measure and document wounds on admission and then weekly on Wednesdays   Refer to [place] Standing order set for wound care, if needed.   Obtain provider order for [place] for patients with Stage 3,4, unstageable and deep tissue pressure injuries   Intake and Output   Weigh patient   In and Out Cath for no void   Initiate Oral Care Protocol   Initiate Carrier Fluid Protocol   Place TED hose   Activity as tolerated   Wound care   Care order/instruction: Apply  antifungal powder (green and white bottle in clean utility) to back and buttocks each shift.   Refer to Sidebar Report: Medical Device Related Pressure Injury Prevention   3 - Step skin care regimen: Cleaner, Moisturizer, Skin Protectant   Pressure redistribution chair pad if out of bed and has any type of wound or MASD on the sacrum/coccyx/buttocks, or if patient will be elevating the feet while in the chair.   Consider obtaining physician's order for mattress replacement with low air loss feature for moisture / microclimate management.   If incontinent of urine and/or stool, consider using an external urinary collection system and/or external fecal collection pouch.   If incontinent of stool, consider obtaining physician's order for a fecal containment device.   Daily weights   Foot Pump   Care order/instruction: Remove wound vac   Full code   Consult to wound, ostomy, continence   OT eval and treat   PT eval and treat   Oxygen therapy Mode or (Route): Nasal cannula   Admit to inpatient rehab   Meds ordered this encounter  Medications   acetaminophen (TYLENOL) tablet 325-650 mg   DISCONTD: allopurinol (ZYLOPRIM) tablet 300 mg   ascorbic acid (VITAMIN C) tablet 1,000 mg   aspirin EC tablet 81 mg   atorvastatin (LIPITOR) tablet 10 mg   clopidogrel (PLAVIX) tablet 75 mg   docusate sodium (COLACE) capsule 100 mg   HYDROcodone-acetaminophen (NORCO/VICODIN) 5-325 MG per tablet 1-2 tablet   insulin aspart (novoLOG) injection 0-15 Units    Order Specific Question:   Correction coverage:    Answer:   Moderate (average weight, post-op)    Order Specific Question:   CBG < 70:    Answer:   Implement Hypoglycemia Standing Orders and refer to Hypoglycemia Standing Orders sidebar report    Order Specific Question:   CBG 70 - 120:    Answer:   0 units    Order Specific Question:   CBG 121 - 150:    Answer:   2 units    Order Specific Question:   CBG 151 - 200:    Answer:   3 units    Order  Specific Question:   CBG 201 - 250:    Answer:   5 units    Order Specific Question:   CBG 251 - 300:    Answer:   8 units    Order Specific Question:   CBG 301 - 350:    Answer:   11 units    Order Specific Question:   CBG 351 - 400:    Answer:   15 units    Order Specific Question:   CBG > 400    Answer:   call MD and obtain STAT lab verification   insulin glargine-yfgn (SEMGLEE) injection  10 Units   nutrition supplement (JUVEN) (JUVEN) powder packet 1 packet   OR Linked Order Group    ondansetron (ZOFRAN) tablet 4 mg    ondansetron (ZOFRAN) injection 4 mg   pantoprazole (PROTONIX) EC tablet 40 mg   polyethylene glycol (MIRALAX / GLYCOLAX) packet 17 g   polyethylene glycol (MIRALAX / GLYCOLAX) packet 17 g   zinc sulfate capsule 220 mg   metoprolol succinate (TOPROL-XL) 24 hr tablet 25 mg    Take with or immediately following a meal.     DISCONTD: 0.9 %  sodium chloride infusion   allopurinol (ZYLOPRIM) tablet 300 mg   gabapentin (NEURONTIN) capsule 100 mg   ferrous sulfate tablet 325 mg   DISCONTD: furosemide (LASIX) tablet 20 mg   torsemide (DEMADEX) tablet 10 mg   potassium chloride (KLOR-CON M) CR tablet 10 mEq   Gerhardt's butt cream     PMFS History: Patient Active Problem List   Diagnosis Date Noted   Left below-knee amputee ([place]) 10/25/2021   Left leg pain    Osteomyelitis of ankle and foot ([place]) 10/15/2021   Anemia 10/15/2021   Pressure ulcer 10/15/2021   History of CVA (cerebrovascular accident) 10/15/2021   Septic arthritis of ankle and foot region ([place]) 10/15/2021   AKI (acute kidney injury) ([place]) 10/14/2021   Pneumonia due to COVID-19 virus 09/09/2019   Hypokalemia 09/09/2019   Acute respiratory failure with hypoxia ([place]) 09/09/2019   Essential hypertension 10/14/2014   Chronic diastolic heart failure ([place]) 10/14/2014   Morbid obesity ([place]) 07/22/2013   Varicose veins of lower extremities with other complications 78/46/9629   OSA (obstructive sleep  apnea) 07/22/2013   Venous stasis ulcers ([place]) 07/22/2013   Diabetes mellitus ([place]) 01/03/2011   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[place])    Essential hypertension    History of nephrolithiasis    History of open leg wound    History of stroke July 2004   Obstructive sleep apnea    TIA (transient ischemic attack)    Varicose veins    Laser ablation of left great saphenous vein 2009    Family History  Problem Relation Age of Onset   Bone cancer Mother    Hypertension Father    Pancreatic cancer Maternal Grandmother    Stroke Paternal Grandmother    Diabetes Paternal Grandfather    Heart attack Brother    Hypertension Brother    Heart attack Sister     Past Surgical History:  Procedure Laterality Date   AMPUTATION Left 10/21/2021   Procedure: LEFT BELOW KNEE AMPUTATION;  Surgeon: [name], MD;  Location: [facility];  Service: Orthopedics;  Laterality: Left;   BIOPSY  10/19/2021   Procedure: BIOPSY;  Surgeon: Sharyn Creamer, MD;  Location: [facility];  Service: Gastroenterology;;   COLONOSCOPY N/A 10/19/2021   Procedure: COLONOSCOPY;  Surgeon: Sharyn Creamer, MD;  Location: [facility];  Service: Gastroenterology;  Laterality: N/A;   CYSTOSCOPY WITH RETROGRADE PYELOGRAM, URETEROSCOPY AND STENT PLACEMENT Right 08/02/2015   Procedure: CYSTOSCOPY WITH RETROGRADE PYELOGRAM, URETEROSCOPY , STONE EXTRACTION AND STENT PLACEMENT;  Surgeon: Cleon Gustin, MD;  Location: [facility];  Service: Urology;  Laterality: Right;   ESOPHAGOGASTRODUODENOSCOPY (EGD) WITH PROPOFOL N/A 10/19/2021   Procedure: ESOPHAGOGASTRODUODENOSCOPY (EGD) WITH PROPOFOL;  Surgeon: Sharyn Creamer, MD;  Location: [facility];  Service: Gastroenterology;  Laterality: N/A;   HOLMIUM LASER APPLICATION Right 52/84/1324   Procedure: HOLMIUM LASER APPLICATION;  Surgeon: Cleon Gustin, MD;  Location:  [facility];  Service: Urology;  Laterality: Right;   KIDNEY STONE SURGERY     POLYPECTOMY  10/19/2021    Procedure: POLYPECTOMY;  Surgeon: Sharyn Creamer, MD;  Location: [facility];  Service: Gastroenterology;;   [place] Left 06/2010   Laser ablation   Social History   Occupational History   Occupation: controll room operator     Employer: [employer]  Tobacco Use   Smoking status: Never   Smokeless tobacco: Never  Substance and Sexual Activity   Alcohol use: No    Alcohol/week: 0.0 standard drinks   Drug use: No   Sexual activity: Not on file

## 2021-11-11 NOTE — Progress Notes (Signed)
Occupational Therapy Session Note  Patient Details  Name: [name] [name] MRN: [id number] Date of Birth: [birth year]  Today's Date: 11/12/2021 OT Individual Time: [phone] OT Individual Time Calculation (min): 58 min  Skilled Therapeutic Interventions/Progress Updates:    Pt greeted in bed with no c/o pain initially. Agreeable to transfer to the w/c once he donned shorts. OT retrieved a reacher to work on AE training. Pt found to have saturated brief. Per pt, condom cath came off last night and staff changed catheter but not wet brief. Pt able to roll to the Rt with CGA to remove soiled brief. Pt with very hard stool packed in buttocks. OT assisted with pericare though noted some bleeding during this process. Informed RN. Pt able to complete perihygiene in the front with Min A to reach under swollen scrotum. When we attempted to don brief, pt reported needing to urgently use the bedpan. He rolled towards Rt with CGA for bedpan placement, placed in upright position to promote continent void. When he was finished, perihygiene performed a 2nd time with Max A. Max A to thread LEs into pants using reacher with instruction. +2 for elevating pants over hips via rolling. +2 for placement of maxisky pad and for transfer to the w/c. Pt needing +2 to safely position hips in the back of w/c. Pt remained sitting up at close of session, all needs within reach and safety belt fastened. Tx focus placed on functional bed mobility, self care retraining, and activity tolerance.   Therapy Documentation Precautions:  Precautions Precautions: Fall Precaution Comments: new L BKA, pressure sores on L/R bottom, would vac Restrictions Weight Bearing Restrictions: Yes LLE Weight Bearing: Non weight bearing ADL: ADL Eating: Independent Grooming: Setup Upper Body Bathing: Setup Where Assessed-Upper Body Bathing: Bed level Lower Body Bathing: Moderate assistance Where Assessed-Lower Body Bathing: Bed level Upper Body  Dressing: Minimal assistance Where Assessed-Upper Body Dressing: Bed level Lower Body Dressing: Moderate assistance Where Assessed-Lower Body Dressing: Bed level Toileting: Not assessed Toilet Transfer: Other (comment) (+ 3 with slide board, but then unable to wt shift well enough to manage clothing and cleansing) Tub/Shower Transfer: Not assessed   Therapy/Group: Individual Therapy  Honesty Menta A Vallorie Niccoli 11/12/2021, 3:34 PM

## 2021-11-12 DIAGNOSIS — I5033 Acute on chronic diastolic (congestive) heart failure: Secondary | ICD-10-CM

## 2021-11-12 DIAGNOSIS — E1165 Type 2 diabetes mellitus with hyperglycemia: Secondary | ICD-10-CM

## 2021-11-12 DIAGNOSIS — G546 Phantom limb syndrome with pain: Secondary | ICD-10-CM

## 2021-11-12 LAB — GLUCOSE, CAPILLARY
Glucose-Capillary: 129 mg/dL — ABNORMAL HIGH (ref 70–99)
Glucose-Capillary: 127 mg/dL — ABNORMAL HIGH (ref 70–99)
Glucose-Capillary: 144 mg/dL — ABNORMAL HIGH (ref 70–99)
Glucose-Capillary: 161 mg/dL — ABNORMAL HIGH (ref 70–99)

## 2021-11-12 MED ORDER — TORSEMIDE 20 MG PO TABS
20.0000 mg | ORAL_TABLET | Freq: Every day | ORAL | Status: DC
  Administered 2021-11-13 – 2021-11-17 (×5): 20 mg via ORAL
  Filled 2021-11-12 (×5): qty 1

## 2021-11-12 NOTE — Progress Notes (Signed)
PROGRESS NOTE   Subjective/Complaints: Patient seen sitting up in bed this morning.  He states he slept fairly overnight because his nasal cannula kept slipping off.  He was seen by Ortho yesterday, notes reviewed-continue dressing changes with shrinker 24/7.  ROS:   Pt denies SOB, abd pain, CP, N/V/C/D, and vision changes      Objective:   No results found. Recent Labs    11/10/21 0654  WBC 6.5  HGB 9.5*  HCT 33.0*  PLT 176      Recent Labs    11/10/21 0654  NA 139  K 3.9  CL 99  CO2 32  GLUCOSE 138*  BUN 17  CREATININE 1.21  CALCIUM 8.4*      Intake/Output Summary (Last 24 hours) at 11/12/2021 1235 Last data filed at 11/12/2021 0836 Gross per 24 hour  Intake 600 ml  Output 950 ml  Net -350 ml      Pressure Injury 10/15/21 Buttocks Right;Left Stage 2 -  Partial thickness loss of dermis presenting as a shallow open injury with a red, pink wound bed without slough. patchy areas of partial thickness skin loss related to moiture associated skin damage (Active)  10/15/21 1635  Location: Buttocks  Location Orientation: Right;Left  Staging: Stage 2 -  Partial thickness loss of dermis presenting as a shallow open injury with a red, pink wound bed without slough.  Wound Description (Comments): patchy areas of partial thickness skin loss related to moiture associated skin damage  Present on Admission: Yes    Physical Exam: Vital Signs Blood pressure (!) 153/81, pulse 62, temperature 98 F (36.7 C), resp. rate 16, height 5\' 10"  (1.778 m), weight (!) 153 kg, SpO2 91 %. General: awake, alert, appropriate.  Morbidly obese. HENT: conjugate gaze; oropharynx moist CV: regular rate; no JVD Pulmonary: CTA B/L; no W/R/R.  + [place]. GI: soft, NT, ND, (+)BS Psychiatric: appropriate Neurological: Alert Bilateral lower extremity edema. Motor: Bilateral lower extremities 4 -/5 proximal distal Skin: Multiple wounds  with dressing  Assessment/Plan: 1. Functional deficits which require 3+ hours per day of interdisciplinary therapy in a comprehensive inpatient rehab setting. Physiatrist is providing close team supervision and 24 hour management of active medical problems listed below. Physiatrist and rehab team continue to assess barriers to discharge/monitor patient progress toward functional and medical goals  Care Tool:  Bathing    Body parts bathed by patient: Right arm, Left arm, Chest, Abdomen, Right upper leg, Left upper leg, Face   Body parts bathed by helper: Front perineal area, Buttocks, Left lower leg, Right lower leg     Bathing assist Assist Level: Moderate Assistance - Patient 50 - 74%     Upper Body Dressing/Undressing Upper body dressing   What is the patient wearing?: Pull over shirt    Upper body assist Assist Level: Maximal Assistance - Patient 25 - 49%    Lower Body Dressing/Undressing Lower body dressing      What is the patient wearing?: Pants     Lower body assist Assist for lower body dressing: Maximal Assistance - Patient 25 - 49%     Toileting Toileting    Toileting assist Assist for toileting: Total  Assistance - Patient < 25%     Transfers Chair/bed transfer  Transfers assist     Chair/bed transfer assist level: Dependent - mechanical lift     Locomotion Ambulation   Ambulation assist   Ambulation activity did not occur: Safety/medical concerns          Walk 10 feet activity   Assist  Walk 10 feet activity did not occur: Safety/medical concerns        Walk 50 feet activity   Assist Walk 50 feet with 2 turns activity did not occur: Safety/medical concerns         Walk 150 feet activity   Assist Walk 150 feet activity did not occur: Safety/medical concerns         Walk 10 feet on uneven surface  activity   Assist Walk 10 feet on uneven surfaces activity did not occur: Safety/medical concerns          Wheelchair     Assist Is the patient using a wheelchair?: Yes Type of Wheelchair: Manual    Wheelchair assist level: Moderate Assistance - Patient 50 - 74% Max wheelchair distance: 150    Wheelchair 50 feet with 2 turns activity    Assist        Assist Level: Moderate Assistance - Patient 50 - 74%   Wheelchair 150 feet activity     Assist      Assist Level: Moderate Assistance - Patient 50 - 74%   Blood pressure (!) 153/81, pulse 62, temperature 98 F (36.7 C), resp. rate 16, height 5\' 10"  (1.778 m), weight (!) 153 kg, SpO2 91 %.  Medical Problem List and Plan: 1. Functional deficits secondary to left BKA 10/21/2021             Continue CIR 2.  Antithrombotics: -DVT/anticoagulation:  Mechanical: Antiembolism stockings, thigh (TED hose) Right lower extremity             -antiplatelet therapy: Aspirin 81 mg daily and Plavix 75 mg daily 3. Pain Management: Hydrocodone as needed -continue gabapentin for phantom limb pain, 100mg  bid Pain controlled on 2/25 4. Mood: Provide emotional support             -antipsychotic agents: N/A 5. Neuropsych: This patient is capable of making decisions on his own behalf. 6. Skin/Wound Care: Routine skin checks.  MASD on buttocks and lymphedema/venous stasis ulcers on RLE- was told Pressure ulcer on buttocks, but not per WOC or nursing.   WOC follow-up wound care as directed.  Wound VAC off  Dressing changes per Ortho-Daily soap cleansing, drying, gauze and a Ace wraps over for XL shrinker. 7. Fluids/Electrolytes/Nutrition: Routine in and outs with follow-up chemistries 8.  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  Status post EGD and colonoscopy 10/19/2021.  Continue PPI x8 weeks. 9.  Acute blood loss anemia.  CBC Latest Ref Rng & Units 11/10/2021 10/31/2021 10/25/2021  WBC 4.0 - 10.5 K/uL 6.5 7.9 8.6  Hemoglobin 13.0 - 17.0 g/dL 9.5(L) 7.8(L) 7.6(L)  Hematocrit  39.0 - 52.0 % 33.0(L) 26.5(L) 25.1(L)  Platelets 150 - 400 K/uL 176 243 229     -fe++ supp  Hemoglobin 9.5 on 2/23, continue to monitor 10.  History of CVA.  Aspirin Plavix as prior to admission. 11.  AKI.  Baseline creatinine 1.3.    -BMET/Cr are improving--now off IVF Creatinine 1.21 on 2/23 12.  Hypertension.  Blood pressure somewhat soft with hydralazine discontinued Toprol  decreased to 25 mg daily to help perfusion for kidneys.   Vitals:   11/11/21 2023 11/12/21 0359  BP: (!) 155/92 (!) 153/81  Pulse: 76 62  Resp: 17 16  Temp: 98.3 F (36.8 C) 98 F (36.7 C)  SpO2: 92% 91%  Start torsemide  and monitor, increased on 2/25-Labs ordered for Monday 13.  Diabetes mellitus type II with hyperglycemia.  Hemoglobin A1c 7.3.  Semglee initiated while in the hospital 10 units daily.     CBG (last 3)  Recent Labs    11/11/21 2055 11/12/21 0554 11/12/21 1152  GLUCAP 145* 129* 127*     Mildly elevated on 2/25 14.  Morbid obesity.  BMI 50.22.  Dietary follow-up 15.  Hyperlipidemia.  Lipitor 16.  History of gout.  Zyloprim 300 mg daily.  Monitor for any gout flareups 17.  Acute on chronic diastolic congestive heart failure.    Filed Weights   11/09/21 0418 11/10/21 0544 11/11/21 0530  Weight: (!) 156.2 kg (!) 153 kg (!) 153 kg  Stable on 2/25 18. R hand swelling- suggest K tape for R hand, elevation  2/12- ktape off- but swelling almost resolved 19. Scrotal swelling- elevation/ towel--improving   2/22- is stable and chronic per pt. 2/23- size of grapefruit, but chronic    LOS: 18 days A FACE TO FACE EVALUATION WAS PERFORMED  [name] [name] [name] [name] 11/12/2021, 12:35 PM

## 2021-11-12 NOTE — Progress Notes (Incomplete)
Occupational Therapy Session Note  Patient Details  Name: [name] [name] MRN: [id number] Date of Birth: 1956-02-16  Today's Date: 11/12/2021 OT Individual Time: [phone] OT Individual Time Calculation (min): 58 min     Skilled Therapeutic Interventions/Progress Updates:      Therapy Documentation Precautions:  Precautions Precautions: Fall Precaution Comments: new L BKA, pressure sores on L/R bottom, would vac Restrictions Weight Bearing Restrictions: Yes LLE Weight Bearing: Non weight bearing Pain: Pain Assessment Pain Score: 4  ADL: ADL Eating: Independent Grooming: Setup Upper Body Bathing: Setup Where Assessed-Upper Body Bathing: Bed level Lower Body Bathing: Moderate assistance Where Assessed-Lower Body Bathing: Bed level Upper Body Dressing: Minimal assistance Where Assessed-Upper Body Dressing: Bed level Lower Body Dressing: Moderate assistance Where Assessed-Lower Body Dressing: Bed level Toileting: Not assessed Toilet Transfer: Other (comment) (+ 3 with slide board, but then unable to wt shift well enough to manage clothing and cleansing) Tub/Shower Transfer: Not assessed   Therapy/Group: Individual Therapy  Karan Inclan A Heliodoro Domagalski 11/12/2021, 7:09 PM

## 2021-11-12 NOTE — Progress Notes (Signed)
Physical Therapy Session Note  Patient Details  Name: [name] [name] MRN: [id number] Date of Birth: 09/19/1955  Today's Date: 11/12/2021 PT Individual Time: [phone] PT Individual Time Calculation (min): 71 min   Short Term Goals:  Week 3:  PT Short Term Goal 1 (Week 3): STG= LTG based on ELOS  Skilled Therapeutic Interventions/Progress Updates:   Pt received sitting in WC and agreeable to PT  Pt performed WC mobility though hall 25f x 3 and 36fx 1 with therapeutic rest break between bouts and intermittent min assist to prevent veer to the R from RUE weakness.   Slide [facility] mod assist overall, Bil press up handles and 4inch step under the RLE. Max cues for anterior weight shift to maintain appropriate head/hip relationship and proper UE palcement on press up bar or WC wheel. +2 required on return [facility] the [facility] safety to stabilize equipment.   Sitting balance, core activation to perform overhead reach with upper thoracis cervical extension 2 x 10, ball toss to eye level 2 x 15, toss off rebounder 2 x 15. Trunkal rotation to tap on press up bar 2 x 15 bil.   Seated BLE therex  from EOB without trunk supportLAQ, hip adduction, hip abduction with manual resistance, reciprocal hip flexion. Each performed 2 x 10-12 bil with cues for hold at end range and decreased speed.   Pt performed facial hygiene and shaving task seated in WC with sustained forward lean to prevent trunk support. Set up assist for positioning of grooming products.   [facility] returned to [facility] and [facility] reach and all needs met.        Therapy Documentation Precautions:  Precautions Precautions: Fall Precaution Comments: new L BKA, pressure sores on L/R bottom, would vac Restrictions Weight Bearing Restrictions: Yes LLE Weight Bearing: Non weight bearing  Pain: Pain Assessment Pain Scale: 0-10 Pain Score: 3  Pain Type: Acute pain Pain Location:  Generalized Pain Intervention(s): Medication (See eMAR)   Therapy/Group: Individual Therapy  AuLorie Phenix/25/2023, 11:56 AM

## 2021-11-13 LAB — GLUCOSE, CAPILLARY
Glucose-Capillary: 127 mg/dL — ABNORMAL HIGH (ref 70–99)
Glucose-Capillary: 142 mg/dL — ABNORMAL HIGH (ref 70–99)
Glucose-Capillary: 133 mg/dL — ABNORMAL HIGH (ref 70–99)
Glucose-Capillary: 181 mg/dL — ABNORMAL HIGH (ref 70–99)

## 2021-11-13 NOTE — Progress Notes (Signed)
Physical Therapy Session Note  Patient Details  Name: [name] [name] MRN: [id number] Date of Birth: 12/11/1955  Today's Date: 11/13/2021 PT Individual Time: 1306-1400 PT Individual Time Calculation (min): 54 min   Short Term Goals: Week 3:  PT Short Term Goal 1 (Week 3): STG= LTG based on ELOS  Skilled Therapeutic Interventions/Progress Updates:    Pt received supine in bed and agreeable to therapy session. Noticed pt has ACE wrap on top of L LE shrinker, anticipate may be due to fact that shrinker tends to roll down? - therapist re-donned ACE wrap as current one was falling off and in poor position. Supine>sitting R EOB, HOB only slightly elevated and relying heavily on bedrail, with +2 light mod assist for trunk upright and R LE management off EOB - cuing for sequencing. Once upright, had to turn off air mattress to maintain pt stability sitting EOB for safety and in order to perform transfer. L lateral scoot transfer EOB>w/c with +2 assist - requires min assist for R lateral trunk lean and lifting L LE in order for +2 assist to place transfer board - performed scooting using bilateral press up handles to improve ability to clear hips - pt requires significantly increased time for scooting and makes very minimal movements when scooting requiring heavy +2 max/total assist to scoot - pt also keeps trunk leaned posteriorly despite cuing/facilitation for anterior trunk lean to increase ease of transfer (anticipate this may be due to pt fear of sliding forward off board despite it being in good position).   B UE w/c propulsion ~61ft into hallway with pt continuing to have difficulty grasping R wheel rim due to hx of R hemiparesis, especially when turning requiring mod assist to make a L turn. Transported pt to gym. Thearpist applied theraband to R wheel rim and provided pt with blue hospital gloves to improve grip/traction on wheels and pt with significantly improved ability to turn w/c 180degrees  without assist. Pt propelled ~44ft with supervision and increased time/effort with pt having more difficulty longer distance due to R UE fatigue.  Retrieved pt a Hydrologist. Transported back to room.   R lateral scoot w/c>EOB (air mattress off for safety) with heavy +2 assist to bari-transfer place board while therapist assisting with maintaining sitting balance and lifting R LE in order for board placement - continues to use press up handles for increased independence during transfer and continues to require significantly increased time and effort with very minimal movement achieved during each scoot - heavy +2 max assist to scoot.  Turned bed inflation back on. Sit>supine using bedrails with mod assist for trunk descent and R LE management into bed. Pt left supine in bed with needs in reach, [facility] elevated, and nurse aware of pt's position.  Therapy Documentation Precautions:  Precautions Precautions: Fall Precaution Comments: new L BKA, pressure sores on L/R bottom, would vac Restrictions Weight Bearing Restrictions: Yes LLE Weight Bearing: Non weight bearing   Pain: No reports of pain throughout session.    Therapy/Group: Individual Therapy  Tawana Scale , PT, DPT, NCS, CSRS  11/13/2021, 12:39 PM

## 2021-11-13 NOTE — Progress Notes (Signed)
Occupational Therapy Session Note  Patient Details  Name: [name] [name] MRN: [id number] Date of Birth: [birth year]  Today's Date: 11/13/2021 OT Individual Time: [phone] OT Individual Time Calculation (min): 75 min    Short Term Goals: Week 1:  OT Short Term Goal 1 (Week 1): Pt will complete BSC/toilet transfer with Mod x2 OT Short Term Goal 1 - Progress (Week 1): Not progressing OT Short Term Goal 2 (Week 1): Pt will perform LB dress with Max A with AE PRN OT Short Term Goal 2 - Progress (Week 1): Progressing toward goal OT Short Term Goal 3 (Week 1): Pt will participate in task of choice/ADL for 10 minutes without rest break OT Short Term Goal 3 - Progress (Week 1): Progressing toward goal Week 2:  OT Short Term Goal 1 (Week 2): STGs = modified LTGs OT Short Term Goal 1 - Progress (Week 2): Progressing toward goal  Skilled Therapeutic Interventions/Progress Updates:    Patient completed simple task in bathing at bed LOF, patient MinA with UB, MaxA with LB. Patient able to complete UB dressing with MinA and required MaxA for LB. Patient able to demonstrate bed mobility with MinA for donning and doffing clothing items with MinA.  The pt was instructed in UB exercises to improve his UB strength for greater independence and safety during task performance, the pt was able to complete 3 lb dumb bell 2 sets of 15 with for bicep curls, shld flexion, and horizontal abduction with rest breaks as needs. The pt was also instructed in UB exercises that can be completed at bed level using medium grade theraband. The pt remained in bed at the end of treatment with no c/o pain and the call light activated and the bedside table in place.    Therapy Documentation Precautions:  Precautions Precautions: Fall Precaution Comments: new L BKA, pressure sores on L/R bottom, would vac Restrictions Weight Bearing Restrictions: Yes LLE Weight Bearing: Non weight bearing General:   Vital Signs: Therapy  Vitals Temp: 98.5 F (36.9 C) Temp Source: Oral Pulse Rate: 72 Resp: 15 BP: (!) 151/90 Patient Position (if appropriate): Lying Oxygen Therapy SpO2: 91 % O2 Device: Room Air      Therapy/Group: Individual Therapy  Yvonne Kendall 11/13/2021, 3:46 PM

## 2021-11-14 LAB — BASIC METABOLIC PANEL
Anion gap: 10 (ref 5–15)
BUN: 20 mg/dL (ref 8–23)
CO2: 33 mmol/L — ABNORMAL HIGH (ref 22–32)
Calcium: 8.9 mg/dL (ref 8.9–10.3)
Chloride: 97 mmol/L — ABNORMAL LOW (ref 98–111)
Creatinine, Ser: 1.21 mg/dL (ref 0.61–1.24)
GFR, Estimated: >60 mL/min (ref >60)
Glucose, Bld: 150 mg/dL — ABNORMAL HIGH (ref 70–99)
Potassium: 4.1 mmol/L (ref 3.5–5.1)
Sodium: 140 mmol/L (ref 135–145)

## 2021-11-14 LAB — GLUCOSE, CAPILLARY
Glucose-Capillary: 132 mg/dL — ABNORMAL HIGH (ref 70–99)
Glucose-Capillary: 119 mg/dL — ABNORMAL HIGH (ref 70–99)
Glucose-Capillary: 115 mg/dL — ABNORMAL HIGH (ref 70–99)
Glucose-Capillary: 151 mg/dL — ABNORMAL HIGH (ref 70–99)

## 2021-11-14 NOTE — Progress Notes (Signed)
Incision to left bka and peri wound has moderate amount of serosanguineous drainage and maceration Dr. Ranell Patrick aware. Dressing changed twice today.    Yehuda Mao, LPN

## 2021-11-14 NOTE — Progress Notes (Signed)
Patient ID: [name] [name], male   DOB: 05-Sep-1956, 66 y.o.   MRN: [id number]  Met with pt to discus expanded bed search he wanted worker to look in [place]. This has been done. Awaiting bed offer.

## 2021-11-14 NOTE — Progress Notes (Signed)
Physical Therapy Session Note  Patient Details  Name: [name] [name] MRN: [id number] Date of Birth: [birth year]  Today's Date: 11/14/2021 PT Individual Time: 1501-1600 PT Individual Time Calculation (min): 59 min   Short Term Goals: Week 3:  PT Short Term Goal 1 (Week 3): STG= LTG based on ELOS  Skilled Therapeutic Interventions/Progress Updates:    Patient received sitting up in wc, agreeable to PT. He denies pain, but reports "pinching" around where shrinker sock is. Requesting assist with urinal. TotalA to manage urinal for continent void. Shrinker sock found fold/rolled in popliteal fossa with bleeding skin tear present. Patients wound also found to have drained through gauze and shrinker sock onto pillow. RN present to assist with wound re-wrapping with PT assisting with positioning. Wound remains very macerated with edges poorly approximated at points. Active bleeding from skin tear and additional site on lateral surface of RL. Skin appears much larger than the last time this PT observed it before the weekend. Gauze on limb to assist with managing drainage + shrinker sock donned. He was able to propel himself in wc x183ft with B UE and supervision. Slideboard transfer back to bed with MaxA x2 with patient participating in transfer using handle on board. MaxA x2 to return supine safely. Patient doffing pants with MaxA. PT observing what looked like blood in patients brief- RN made aware. Patient remaining up in bed, 4 rails up, needs within reach.   Therapy Documentation Precautions:  Precautions Precautions: Fall Precaution Comments: new L BKA, pressure sores on L/R bottom, would vac Restrictions Weight Bearing Restrictions: Yes LLE Weight Bearing: Non weight bearing    Therapy/Group: Individual Therapy  Karoline Caldwell, PT, DPT, CBIS  11/14/2021, 7:40 AM

## 2021-11-14 NOTE — Progress Notes (Signed)
PROGRESS NOTE   Subjective/Complaints: No new complaints today Denies much residual limb or phantom limb pain but does wince during dressing change Wound looks very raw, ortho consulted  ROS:   Pt denies SOB, abd pain, CP, N/V/C/D, and vision changes, pain      Objective:   No results found. No results for input(s): WBC, HGB, HCT, PLT in the last 72 hours.   No results for input(s): NA, K, CL, CO2, GLUCOSE, BUN, CREATININE, CALCIUM in the last 72 hours.   Intake/Output Summary (Last 24 hours) at 11/14/2021 1326 Last data filed at 11/14/2021 1154 Gross per 24 hour  Intake --  Output 1675 ml  Net -1675 ml     Pressure Injury 10/15/21 Buttocks Right;Left Stage 2 -  Partial thickness loss of dermis presenting as a shallow open injury with a red, pink wound bed without slough. patchy areas of partial thickness skin loss related to moiture associated skin damage (Active)  10/15/21 1635  Location: Buttocks  Location Orientation: Right;Left  Staging: Stage 2 -  Partial thickness loss of dermis presenting as a shallow open injury with a red, pink wound bed without slough.  Wound Description (Comments): patchy areas of partial thickness skin loss related to moiture associated skin damage  Present on Admission: Yes    Physical Exam: Vital Signs Blood pressure (!) 141/70, pulse 75, temperature 97.9 F (36.6 C), temperature source Oral, resp. rate 17, height 5\' 10"  (1.778 m), weight (!) 151 kg, SpO2 95 %. General: awake, alert, appropriate.  Morbidly obese. HENT: conjugate gaze; oropharynx moist CV: regular rate; no JVD Pulmonary: CTA B/L; no W/R/R.  + Sharon. GI: soft, NT, ND, (+)BS Psychiatric: appropriate Neurological: Alert Bilateral lower extremity edema. Motor: Bilateral lower extremities 4 -/5 proximal distal Skin: Multiple wounds     Assessment/Plan: 1. Functional deficits which require 3+ hours per day of  interdisciplinary therapy in a comprehensive inpatient rehab setting. Physiatrist is providing close team supervision and 24 hour management of active medical problems listed below. Physiatrist and rehab team continue to assess barriers to discharge/monitor patient progress toward functional and medical goals  Care Tool:  Bathing    Body parts bathed by patient: Right arm, Left arm, Chest, Abdomen, Right upper leg, Left upper leg, Face   Body parts bathed by helper: Front perineal area, Buttocks, Left lower leg, Right lower leg     Bathing assist Assist Level: Moderate Assistance - Patient 50 - 74%     Upper Body Dressing/Undressing Upper body dressing   What is the patient wearing?: Pull over shirt    Upper body assist Assist Level: Maximal Assistance - Patient 25 - 49%    Lower Body Dressing/Undressing Lower body dressing      What is the patient wearing?: Pants     Lower body assist Assist for lower body dressing: Maximal Assistance - Patient 25 - 49%     Toileting Toileting    Toileting assist Assist for toileting: Total Assistance - Patient < 25%     Transfers Chair/bed transfer  Transfers assist     Chair/bed transfer assist level: 2 Helpers (+2 max A slide board)     Locomotion  Ambulation   Ambulation assist   Ambulation activity did not occur: Safety/medical concerns          Walk 10 feet activity   Assist  Walk 10 feet activity did not occur: Safety/medical concerns        Walk 50 feet activity   Assist Walk 50 feet with 2 turns activity did not occur: Safety/medical concerns         Walk 150 feet activity   Assist Walk 150 feet activity did not occur: Safety/medical concerns         Walk 10 feet on uneven surface  activity   Assist Walk 10 feet on uneven surfaces activity did not occur: Safety/medical concerns         Wheelchair     Assist Is the patient using a wheelchair?: Yes Type of Wheelchair:  Manual    Wheelchair assist level: Moderate Assistance - Patient 50 - 74% Max wheelchair distance: 150    Wheelchair 50 feet with 2 turns activity    Assist        Assist Level: Moderate Assistance - Patient 50 - 74%   Wheelchair 150 feet activity     Assist      Assist Level: Moderate Assistance - Patient 50 - 74%   Blood pressure (!) 141/70, pulse 75, temperature 97.9 F (36.6 C), temperature source Oral, resp. rate 17, height 5\' 10"  (1.778 m), weight (!) 151 kg, SpO2 95 %.  Medical Problem List and Plan: 1. 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  2.  Antithrombotics: -DVT/anticoagulation:  Mechanical: Antiembolism stockings, thigh (TED hose) Right lower extremity             -antiplatelet therapy: Aspirin 81 mg daily and Plavix 75 mg daily 3. Residual limb/phantom limb pain: Denies: discontinue hydrocodone.  -continue gabapentin for phantom limb pain, 100mg  bid Pain controlled on 2/25 4. Mood: Provide emotional support             -antipsychotic agents: N/A 5. Neuropsych: This patient is capable of making decisions on his own behalf. 6. Skin/Wound Care: Routine skin checks.  MASD on buttocks and lymphedema/venous stasis ulcers on RLE- was told Pressure ulcer on buttocks, but not per WOC or nursing.   WOC follow-up wound care as directed.  Wound VAC off  Dressing changes per Ortho-Daily soap cleansing, drying, gauze and a Ace wraps over for XL shrinker. 7. Fluids/Electrolytes/Nutrition: Routine in and outs with follow-up chemistries 8.  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  Status post EGD and colonoscopy 10/19/2021.  Continue PPI x8 weeks. 9.  Acute blood loss anemia.  CBC Latest Ref Rng & Units 11/10/2021 10/31/2021 10/25/2021  WBC 4.0 - 10.5 K/uL 6.5 7.9 8.6  Hemoglobin 13.0 - 17.0 g/dL 9.5(L) 7.8(L) 7.6(L)   Hematocrit 39.0 - 52.0 % 33.0(L) 26.5(L) 25.1(L)  Platelets 150 - 400 K/uL 176 243 229     -fe++ supp  Hemoglobin 9.5 on 2/23, continue to monitor 10.  History of CVA.  Aspirin Plavix as prior to admission. 11.  AKI.  Baseline creatinine 1.3.    -BMET/Cr are improving--now off IVF Creatinine 1.21 on 2/23, repeat today 12.  Hypertension.  Blood pressure somewhat soft with hydralazine discontinued Toprol decreased to 25 mg daily to help perfusion for kidneys.   Vitals:  11/14/21 0524 11/14/21 1300  BP: (!) 164/94 (!) 141/70  Pulse: 73 75  Resp: 18 17  Temp: 98.4 F (36.9 C) 97.9 F (36.6 C)  SpO2: 91% 95%  Start torsemide  and monitor, increased on 2/25- BMP currently in process, follow-up 13.  Diabetes mellitus type II with hyperglycemia.  Hemoglobin A1c 7.3.  Semglee initiated while in the hospital 10 units daily.     CBG (last 3)  Recent Labs    11/13/21 2045 11/14/21 0551 11/14/21 1132  GLUCAP 181* 132* 119*    Mildly elevated on 2/25 14.  Morbid obesity.  BMI 50.22.  Dietary follow-up 15.  Hyperlipidemia.  Lipitor 16.  History of gout.  Zyloprim 300 mg daily.  Monitor for any gout flareups 17.  Acute on chronic diastolic congestive heart failure.    Filed Weights   11/11/21 0530 11/13/21 0500 11/14/21 0330  Weight: (!) 153 kg (!) 152 kg (!) 151 kg  Stable on 2/27 18. R hand swelling- suggest K tape for R hand, elevation  2/12- ktape off- but swelling almost resolved 19. Scrotal swelling- elevation/ towel--improving   2/22- is stable and chronic per pt. 2/23- size of grapefruit, but chronic    LOS: 20 days A FACE TO FACE EVALUATION WAS PERFORMED  Martha Clan P Nixie Laube 11/14/2021, 1:26 PM

## 2021-11-14 NOTE — Progress Notes (Signed)
Occupational Therapy Session Note  Patient Details  Name: [name] [name] MRN: [id number] Date of Birth: 1956-05-07  Today's Date: 11/14/2021 OT Individual Time: 1505-6979 OT Individual Time Calculation (min): 60 min (missed 15 min due to MD visit and RN care)   Short Term Goals: Week 1:  OT Short Term Goal 1 (Week 1): Pt will complete BSC/toilet transfer with Mod x2 OT Short Term Goal 1 - Progress (Week 1): Not progressing OT Short Term Goal 2 (Week 1): Pt will perform LB dress with Max A with AE PRN OT Short Term Goal 2 - Progress (Week 1): Progressing toward goal OT Short Term Goal 3 (Week 1): Pt will participate in task of choice/ADL for 10 minutes without rest break OT Short Term Goal 3 - Progress (Week 1): Progressing toward goal Week 2:  OT Short Term Goal 1 (Week 2): STGs = modified LTGs OT Short Term Goal 1 - Progress (Week 2): Progressing toward goal Week 3:  OT Short Term Goal 1 (Week 3): continuation of working towards LTGs - LOS extended due to delay in SNF placement     Skilled Therapeutic Interventions/Progress Updates:    Pt received in bed stating he needed to have a BM.  Obtained a bed pan, but when pt was rolling to his side, his bowel released into his brief before bed pan could be placed. Placed pan in case pt had more.  During this time MD arrived to assess his limb and RN completed wound care.  When they finished, OT session resumed. Pt rolled side to side for cleansing and brief change. Started pants over catheter bag and R foot then pt had to pull up to semi sit to grab pants to pull up further up leg.  He then got over his L leg with min A.  Pt rolled side to side to get pants over hips with max A.    Pt then had to work on "bed climbs" by using side rails to pull self up in bed numerous times to engage core and arms.   Pt sat to EOB with supervision using bed rails and head of bed elevated with MAX cues to really push himself using his arms and core. Pt truly  struggled to get himself there but was able to do so with extra time.   Bed deflated for safety from sliding.  +2 A to use board to transfer to wc to left with max A and use of push up blocks for part of transfer.   Pt then able to scoot himself back into wc with mod A.  Pt set up with leg rests, belt alarm and tray table. Pt resting in room with all needs met.   Therapy Documentation Precautions:  Precautions Precautions: Fall Precaution Comments: new L BKA, pressure sores on L/R bottom, would vac Restrictions Weight Bearing Restrictions: Yes LLE Weight Bearing: Non weight bearing Pain:  C/o pain L limb with donning of shrinker ADL: ADL Eating: Independent Grooming: Setup Upper Body Bathing: Setup Where Assessed-Upper Body Bathing: Bed level Lower Body Bathing: Moderate assistance Where Assessed-Lower Body Bathing: Bed level Upper Body Dressing: Minimal assistance Where Assessed-Upper Body Dressing: Bed level Lower Body Dressing: Moderate assistance Where Assessed-Lower Body Dressing: Bed level Toileting: Not assessed Toilet Transfer: Other (comment) (+ 3 with slide board, but then unable to wt shift well enough to manage clothing and cleansing) Tub/Shower Transfer: Not assessed  Therapy/Group: Individual Therapy  [place] 11/14/2021, 10:22 AM

## 2021-11-15 LAB — GLUCOSE, CAPILLARY
Glucose-Capillary: 160 mg/dL — ABNORMAL HIGH (ref 70–99)
Glucose-Capillary: 149 mg/dL — ABNORMAL HIGH (ref 70–99)
Glucose-Capillary: 151 mg/dL — ABNORMAL HIGH (ref 70–99)
Glucose-Capillary: 158 mg/dL — ABNORMAL HIGH (ref 70–99)

## 2021-11-15 MED ORDER — INSULIN GLARGINE-YFGN 100 UNIT/ML ~~LOC~~ SOLN
11.0000 [IU] | Freq: Every day | SUBCUTANEOUS | Status: DC
  Administered 2021-11-16: 11 [IU] via SUBCUTANEOUS
  Filled 2021-11-15 (×2): qty 0.11

## 2021-11-15 MED ORDER — GABAPENTIN 100 MG PO CAPS: 100.0000 mg | ORAL_CAPSULE | Freq: Every day | ORAL | Status: DC

## 2021-11-15 NOTE — Progress Notes (Signed)
L BKA dressed with abd pad and kerlix @0630  this morning. Moderate amount of serosanguineous drainage. Patient sitting up in bed with call bell within reach. Will continue with plan of care.

## 2021-11-15 NOTE — Patient Care Conference (Signed)
Inpatient RehabilitationTeam Conference and Plan of Care Update Date: 11/15/2021   Time: 12:13 PM    Patient Name: [name] [name]      Medical Record Number: [id number]  Date of Birth: 1955/10/01 Sex: Male         Room/Bed: 4W14C/4W14C-01 Payor Info: Payor: MEDICARE / Plan: MEDICARE PART A AND B / Product Type: *No Product type* /    Admit Date/Time:  10/25/2021 12:32 PM  Primary Diagnosis:  Left below-knee amputee [facility])  Hospital Problems: Principal Problem:   Left below-knee amputee ([place]) Active Problems:   Acute on chronic diastolic (congestive) heart failure ([place])   Controlled type 2 diabetes mellitus with hyperglycemia, without long-term current use of insulin ([place])   Phantom limb pain [facility])    Expected Discharge Date: Expected Discharge Date:  (SNF)  Team Members Present: Physician leading conference: Dr. Alger Simons Social Worker Present: Ovidio Kin, LCSW Nurse Present: Dorien Chihuahua, RN PT Present: Estevan Ryder, PT OT Present: Meriel Pica, OT SLP Present: Weston Anna, SLP PPS Coordinator present : Gunnar Fusi, SLP     Current Status/Progress Goal Weekly Team Focus  Bowel/Bladder     Incontinent of bowel at times, using urinal w assist   Continent of bowel and bladder   Toileting  Swallow/Nutrition/ Hydration             ADL's   max with LB self care, max of 2 slide board,improving with bed mobility skills and being able to adjust self in wc  CGA sit balance, max LB dressing, mod bathing  ADL and functional mobility training   Mobility   fluctuating MinA- MaxA/TotalA for bed mobility, MaxA/TotalA x2-3 for SBT (for safety) or maximove, spv wc mob, STS with Sara+ MaxA x2  downgraded to [place], will need to be downgraded more  pt/fam ed, strength, transfers, wc mob, dc planning, amputee ed   Communication             Safety/Cognition/ Behavioral Observations            Pain     N/A        Skin     Incision w sutures left, dressing to wound on  right calf area   Incision healing, wound healing and no new skin integrity issues   Wound care, management of secondary risks and increased mobility    Discharge Planning:  Slow gains in therapies, searching for NH bed and awaiting bed offer   Team Discussion: Patient pending SNF. Left incision with macerated skin; continue dressing changes BID/prn. Wound care to right leg continues as ordered.  Patient on target to meet rehab goals: Patient continues to need max assist overall; can rollover with min assist however needs +3 assist for safety using slide board transfers.   *See Care Plan and progress notes for long and short-term goals.   Revisions to Treatment Plan:  Change out air mattress to regular mattress   Teaching Needs: Safety, skin care, medications, secondary risk management, etc.   Current Barriers to Discharge: Decreased caregiver support, Home enviroment access/layout, and Wound care  Possible Resolutions to Barriers: SNF recommended; FL2 sent out     Medical Summary               I attest that I was present, lead the team conference, and concur with the assessment and plan of the team.   [name] 11/15/2021, 12:37 PM

## 2021-11-15 NOTE — Progress Notes (Signed)
Occupational Therapy Session Note  Patient Details  Name: [name] [name] MRN: [id number] Date of Birth: May 10, 1956  Today's Date: 11/15/2021 OT Individual Time: 1310-1415 OT Individual Time Calculation (min): 65 min    Short Term Goals: Week 3:  OT Short Term Goal 1 (Week 3): continuation of working towards [place] - LOS extended due to delay in SNF placement  Skilled Therapeutic Interventions/Progress Updates:    Pt received in wc.  "My bottom hurts". Pt was in mod posterior pelvic tilt with hips slid forward on seat 2 inches.  Mod A to help pt scoot back into seat as he was not able to lift body wt with arms.  Pt unable to lift R leg onto leg rest without A.  Pt received regular hospital bed. Set up slide board for pt to transfer wc to bed. Pt on roho cushion. +2 A for sliding on board to bed. Pt not moving his hips hardly at all bc he was not able to lean forward or lift hips with arms. Max of 2 to slide. For the last few inches of transfer, board moved off pf [place] and was just barely on the edge due to the nature of the cushion. Told pt he needs to put in FULL effort to help [place] move him over. +2 max to complete transfer. Pt then had to scoot back on bed mod A. Max to move to supine.  Continues to need max to roll to his L side due to weak R arm and leg.    Pt able to help pull him self up in bed using rails and trendelenburg position.  Worked on UE strengthening using green theraband anchored to bed.   Tricep ext, overhead pull downs, back rows  Attached 1 end of gait belt to end of bed and had pt "climb" hands up belt as if he was climbing a rope. 4 hand movements forward to move into flexion, 4 hand movements back 10x.   Pt resting in bed with all needs met. Alarm set.   Therapy Documentation Precautions:  Precautions Precautions: Fall Precaution Comments: new L BKA, pressure sores on L/R bottom, would vac Restrictions Weight Bearing Restrictions: No LLE Weight Bearing: Non weight  bearing  Pain: Pain Assessment Pain Score: Asleep ADL: ADL Eating: Independent Grooming: Setup Upper Body Bathing: Setup Where Assessed-Upper Body Bathing: Bed level Lower Body Bathing: Moderate assistance Where Assessed-Lower Body Bathing: Bed level Upper Body Dressing: Minimal assistance Where Assessed-Upper Body Dressing: Bed level Lower Body Dressing: Moderate assistance Where Assessed-Lower Body Dressing: Bed level Toileting: Not assessed Toilet Transfer: Other (comment) (+ 3 with slide board, but then unable to wt shift well enough to manage clothing and cleansing) Tub/Shower Transfer: Not assessed   Therapy/Group: Individual Therapy  Elley Harp 11/15/2021, 12:40 PM

## 2021-11-15 NOTE — Progress Notes (Signed)
Physical Therapy Session Note  Patient Details  Name: [name] [name] MRN: [id number] Date of Birth: 06-14-1956  Today's Date: 11/15/2021 PT Individual Time: 0935-1032 PT Individual Time Calculation (min): 57 min   Short Term Goals: Week 3:  PT Short Term Goal 1 (Week 3): STG= LTG based on ELOS  Skilled Therapeutic Interventions/Progress Updates:    Patient received supine in bed, agreeable to PT. He denies pain. TotalA to don pants at bedlevel with MinA/MaxA to roll. MaxiSky used to transfer to wc for time management. He propelled himself in wc ~161ft with supervision before fatiguing. PT building up R leg rest to allow for greater foot placement and support. Patient completing the following therex: marches, LAQ, hip abd/add seated in wc with no added resistance. Patient with difficulty moving B LE against gravity. He was able to propel himself ~50ft back toward his room before PT had to take over. Remaining up in wc, seatbelt alarm on, call light within reach.   Therapy Documentation Precautions:  Precautions Precautions: Fall Precaution Comments: new L BKA, pressure sores on L/R bottom, would vac Restrictions Weight Bearing Restrictions: No LLE Weight Bearing: Non weight bearing    Therapy/Group: Individual Therapy  Karoline Caldwell, PT, DPT, CBIS  11/15/2021, 7:28 AM

## 2021-11-15 NOTE — Progress Notes (Signed)
PROGRESS NOTE   Subjective/Complaints: No new complaints today Eating lunch Discussed surgical follow-up Denies pain  ROS:   Pt denies SOB, abd pain, CP, N/V/C/D, and vision changes, pain   Objective:   No results found. No results for input(s): WBC, HGB, HCT, PLT in the last 72 hours.   Recent Labs    11/14/21 1256  NA 140  K 4.1  CL 97*  CO2 33*  GLUCOSE 150*  BUN 20  CREATININE 1.21  CALCIUM 8.9     Intake/Output Summary (Last 24 hours) at 11/15/2021 1416 Last data filed at 11/15/2021 0700 Gross per 24 hour  Intake 240 ml  Output 925 ml  Net -685 ml     Pressure Injury 10/15/21 Buttocks Right;Left Stage 2 -  Partial thickness loss of dermis presenting as a shallow open injury with a red, pink wound bed without slough. patchy areas of partial thickness skin loss related to moiture associated skin damage (Active)  10/15/21 1635  Location: Buttocks  Location Orientation: Right;Left  Staging: Stage 2 -  Partial thickness loss of dermis presenting as a shallow open injury with a red, pink wound bed without slough.  Wound Description (Comments): patchy areas of partial thickness skin loss related to moiture associated skin damage  Present on Admission: Yes    Physical Exam: Vital Signs Blood pressure (!) 159/95, pulse 83, temperature 98.8 F (37.1 C), temperature source Oral, resp. rate 16, height 5\' 10"  (1.778 m), weight (!) 151 kg, SpO2 93 %. General: awake, alert, appropriate.  Morbidly obese. BMI 47.77 HENT: conjugate gaze; oropharynx moist CV: regular rate; no JVD Pulmonary: CTA B/L; no W/R/R.  + [place]. GI: soft, NT, ND, (+)BS Psychiatric: appropriate Neurological: Alert Bilateral lower extremity edema. Motor: Bilateral lower extremities 4 -/5 proximal distal Skin: Multiple wounds     Assessment/Plan: 1. Functional deficits which require 3+ hours per day of interdisciplinary therapy in a  comprehensive inpatient rehab setting. Physiatrist is providing close team supervision and 24 hour management of active medical problems listed below. Physiatrist and rehab team continue to assess barriers to discharge/monitor patient progress toward functional and medical goals  Care Tool:  Bathing    Body parts bathed by patient: Right arm, Left arm, Chest, Abdomen, Right upper leg, Left upper leg, Face   Body parts bathed by helper: Front perineal area, Buttocks, Left lower leg, Right lower leg     Bathing assist Assist Level: Moderate Assistance - Patient 50 - 74%     Upper Body Dressing/Undressing Upper body dressing   What is the patient wearing?: Pull over shirt    Upper body assist Assist Level: Maximal Assistance - Patient 25 - 49%    Lower Body Dressing/Undressing Lower body dressing      What is the patient wearing?: Pants     Lower body assist Assist for lower body dressing: Maximal Assistance - Patient 25 - 49%     Toileting Toileting    Toileting assist Assist for toileting: Total Assistance - Patient < 25%     Transfers Chair/bed transfer  Transfers assist     Chair/bed transfer assist level: 2 Helpers (+2 max A slide board)  Locomotion Ambulation   Ambulation assist   Ambulation activity did not occur: Safety/medical concerns          Walk 10 feet activity   Assist  Walk 10 feet activity did not occur: Safety/medical concerns        Walk 50 feet activity   Assist Walk 50 feet with 2 turns activity did not occur: Safety/medical concerns         Walk 150 feet activity   Assist Walk 150 feet activity did not occur: Safety/medical concerns         Walk 10 feet on uneven surface  activity   Assist Walk 10 feet on uneven surfaces activity did not occur: Safety/medical concerns         Wheelchair     Assist Is the patient using a wheelchair?: Yes Type of Wheelchair: Manual    Wheelchair assist level:  Moderate Assistance - Patient 50 - 74% Max wheelchair distance: 150    Wheelchair 50 feet with 2 turns activity    Assist        Assist Level: Moderate Assistance - Patient 50 - 74%   Wheelchair 150 feet activity     Assist      Assist Level: Moderate Assistance - Patient 50 - 74%   Blood pressure (!) 159/95, pulse 83, temperature 98.8 F (37.1 C), temperature source Oral, resp. rate 16, height 5\' 10"  (1.778 m), weight (!) 151 kg, SpO2 93 %.  Medical Problem List and Plan: 1. 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   Messaged April to schedule HFU 2.  Antithrombotics: -DVT/anticoagulation:  Mechanical: Antiembolism stockings, thigh (TED hose) Right lower extremity             -antiplatelet therapy: Aspirin 81 mg daily and Plavix 75 mg daily 3. Residual limb/phantom limb pain: Denies: discontinue hydrocodone.  -decrease gabapentin to 100mg  HS 4. Mood: Provide emotional support             -antipsychotic agents: N/A 5. Neuropsych: This patient is capable of making decisions on his own behalf. 6. Skin/Wound Care: Routine skin checks.  MASD on buttocks and lymphedema/venous stasis ulcers on RLE- was told Pressure ulcer on buttocks, but not per WOC or nursing.   WOC follow-up wound care as directed.  Wound VAC off  Dressing changes per Ortho-Daily soap cleansing, drying, gauze and a Ace wraps over for XL shrinker. 7. Fluids/Electrolytes/Nutrition: Routine in and outs with follow-up chemistries 8.  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  Status post EGD and colonoscopy 10/19/2021.  Continue PPI x8 weeks. 9.  Acute blood loss anemia.  CBC Latest Ref Rng & Units 11/10/2021 10/31/2021 10/25/2021  WBC 4.0 - 10.5 K/uL 6.5 7.9 8.6  Hemoglobin 13.0 - 17.0 g/dL 9.5(L) 7.8(L) 7.6(L)  Hematocrit 39.0 - 52.0 % 33.0(L) 26.5(L) 25.1(L)   Platelets 150 - 400 K/uL 176 243 229     -fe++ supp  Hemoglobin 9.5 on 2/23, continue to monitor 10.  History of CVA.  Aspirin Plavix as prior to admission. 11.  AKI.  Baseline creatinine 1.3.    -BMET/Cr are improving--now off IVF Creatinine 1.21 on 2/23, repeat today 12.  Hypertension.  Blood pressure somewhat soft with hydralazine discontinued Toprol decreased to 25 mg daily to help perfusion for kidneys.   Vitals:  11/15/21 0318 11/15/21 1255  BP: (!) 150/90 (!) 159/95  Pulse: 78 83  Resp: 18 16  Temp: 98.8 F (37.1 C)   SpO2: 91% 93%  Start torsemide  and monitor, increased on 2/25- Cr reviewed and stable on 1.21. 13.  Diabetes mellitus type II with hyperglycemia.  Hemoglobin A1c 7.3.  Increase Semglee to 11U  CBG (last 3)  Recent Labs    11/14/21 2056 11/15/21 0541 11/15/21 1126  GLUCAP 151* 160* 149*     14.  Morbid obesity.  BMI 50.22.  Dietary follow-up 15.  Hyperlipidemia.  Lipitor 16.  History of gout.  Zyloprim 300 mg daily.  Monitor for any gout flareups 17.  Acute on chronic diastolic congestive heart failure.    Filed Weights   11/13/21 0500 11/14/21 0330 11/15/21 0318  Weight: (!) 152 kg (!) 151 kg (!) 151 kg  Stable on 2/27 18. R hand swelling- suggest K tape for R hand, elevation  2/12- ktape off- but swelling almost resolved 19. Scrotal swelling- elevation/ towel--improving   2/22- is stable and chronic per pt. 2/23- size of grapefruit, but chronic    LOS: 21 days A FACE TO FACE EVALUATION WAS PERFORMED  Martha Clan P [place] 11/15/2021, 2:16 PM

## 2021-11-16 LAB — GLUCOSE, CAPILLARY
Glucose-Capillary: 137 mg/dL — ABNORMAL HIGH (ref 70–99)
Glucose-Capillary: 147 mg/dL — ABNORMAL HIGH (ref 70–99)
Glucose-Capillary: 146 mg/dL — ABNORMAL HIGH (ref 70–99)
Glucose-Capillary: 157 mg/dL — ABNORMAL HIGH (ref 70–99)

## 2021-11-16 MED ORDER — ACETAMINOPHEN 325 MG PO TABS: 325.0000 mg | ORAL_TABLET | Freq: Four times a day (QID) | ORAL | Status: AC | PRN

## 2021-11-16 MED ORDER — INSULIN GLARGINE-YFGN 100 UNIT/ML ~~LOC~~ SOLN: 11.0000 [IU] | Freq: Every day | SUBCUTANEOUS | 11 refills | Status: DC

## 2021-11-16 MED ORDER — GABAPENTIN 100 MG PO CAPS: 100.0000 mg | ORAL_CAPSULE | Freq: Every day | ORAL | Status: AC

## 2021-11-16 MED ORDER — INSULIN GLARGINE-YFGN 100 UNIT/ML ~~LOC~~ SOLN
12.0000 [IU] | Freq: Every day | SUBCUTANEOUS | Status: DC
  Administered 2021-11-17: 12 [IU] via SUBCUTANEOUS
  Filled 2021-11-16: qty 0.12

## 2021-11-16 MED ORDER — DOCUSATE SODIUM 100 MG PO CAPS: 100.0000 mg | ORAL_CAPSULE | Freq: Two times a day (BID) | ORAL | 0 refills | Status: DC

## 2021-11-16 MED ORDER — TORSEMIDE 20 MG PO TABS: 20.0000 mg | ORAL_TABLET | Freq: Every day | ORAL | Status: DC

## 2021-11-16 MED ORDER — POLYETHYLENE GLYCOL 3350 17 G PO PACK: 17.0000 g | PACK | Freq: Two times a day (BID) | ORAL | 0 refills | Status: DC

## 2021-11-16 MED ORDER — FERROUS SULFATE 325 (65 FE) MG PO TABS: 325.0000 mg | ORAL_TABLET | Freq: Every day | ORAL | 3 refills | Status: AC

## 2021-11-16 MED ORDER — ASCORBIC ACID 1000 MG PO TABS: 1000.0000 mg | ORAL_TABLET | Freq: Every day | ORAL | Status: DC

## 2021-11-16 MED ORDER — POTASSIUM CHLORIDE CRYS ER 10 MEQ PO TBCR: 10.0000 meq | EXTENDED_RELEASE_TABLET | Freq: Every day | ORAL | Status: DC

## 2021-11-16 NOTE — Plan of Care (Signed)
Patient unable to manage wound care with min assist and is incontinent of bowel ?

## 2021-11-16 NOTE — Discharge Summary (Addendum)
Physician Discharge Summary  Patient ID: [name] [name] MRN: [id number] DOB/AGE: March 17, 1956 66 y.o.  Admit date: 10/25/2021 Discharge date: 11/17/2021  Discharge Diagnoses:  Principal Problem:   Left below-knee amputee [facility]) Active Problems:   Acute on chronic diastolic (congestive) heart failure (HCC)   Controlled type 2 diabetes mellitus with hyperglycemia, without long-term current use of insulin (HCC)   Phantom limb pain (HCC) Acute blood loss anemia Upper and lower GI bleed/gastric erosions/gastric ulcer History of CVA AKI Hypertension Diabetes mellitus Morbid obesity Hyperlipidemia History of gout Chronic scrotal swelling.  Discharged Condition: Stable  Significant Diagnostic Studies: US RENAL  Result Date: 10/24/2021 CLINICAL DATA:  Renal dysfunction EXAM: RENAL / URINARY TRACT ULTRASOUND COMPLETE COMPARISON:  03/31/2016 FINDINGS: Right Kidney: Renal measurements: 12.3 x 6.8 x 7 cm = volume: 303.38 mL. There is increased echogenicity. There is no hydronephrosis. There is 1.3 cm cyst in the midportion. Left Kidney: Renal measurements: 15.4 by 8.2 x 9 cm = volume: 594.7 mL. There is no hydronephrosis. There is increased cortical echogenicity. There is 1.5 cm cyst in the midportion. Bladder: Ureteral jets are observed at both ureterovesical junctions. Other: According to the note by the technologist, examination was technically difficult due to patient's body habitus. IMPRESSION: There is no hydronephrosis. Increased cortical echogenicity suggests medical renal disease. Bilateral renal cysts. Electronically Signed   By: Elmer Picker M.D.   On: 10/24/2021 19:31   DG CHEST PORT 1 VIEW  Result Date: 10/20/2021 CLINICAL DATA:  Shortness of breath EXAM: PORTABLE CHEST 1 VIEW COMPARISON:  Previous studies including the examination of 10/15/2018 FINDINGS: Transverse diameter of heart is increased. Central pulmonary vessels are prominent without signs of alveolar pulmonary edema.  Evaluation of lower lung fields is limited by patient's body habitus. There is increased density in the left lower lung fields with interval improvement. There is blunting of left lateral CP angle. There is no pneumothorax. IMPRESSION: Cardiomegaly. Central pulmonary vessels are prominent without signs of pulmonary edema. Increased density in the left lower lung fields may be due to pleural effusion and possibly underlying infiltrate. There is interval improvement in aeration of left lower lung fields. Electronically Signed   By: Elmer Picker M.D.   On: 10/20/2021 09:35    Labs:  Basic Metabolic Panel: Recent Labs  Lab 11/10/21 0654 11/14/21 1256  NA 139 140  K 3.9 4.1  CL 99 97*  CO2 32 33*  GLUCOSE 138* 150*  BUN 17 20  CREATININE 1.21 1.21  CALCIUM 8.4* 8.9    CBC: Recent Labs  Lab 11/10/21 0654  WBC 6.5  NEUTROABS 4.3  HGB 9.5*  HCT 33.0*  MCV 85.1  PLT 176    CBG: Recent Labs  Lab 11/15/21 0541 11/15/21 1126 11/15/21 1642 11/15/21 2107 11/16/21 0611  GLUCAP 160* 149* 151* 158* 137*   Family history.  Mother with bone cancer father with hypertension maternal grandmother with pancreatic cancer.  Denies any colon cancer esophageal cancer or rectal cancer  Brief HPI:   [name] [name] is a 66 y.o. right-handed male with history of CVA, hypertension diabetes mellitus OSA not on CPAP obesity with BMI 20.94 diastolic congestive heart failure chronic venous stasis and lymphedema of bilateral lower extremities followed by wound care.  Per chart review lives alone.  He has a personal care attendant.  Presented 10/14/2021 with persistent left ankle pain since he twisted his ankle getting into his car approxi-1 month ago.  He was initially referred to [facility] x-rays negative for fracture.  Patient notes extensive edema venous stasis ulceration with purulent drainage and necrotic ulcers of left lower extremity.  Admission chemistries glucose 151 BUN 71 creatinine 1.74  hemoglobin 9.4 down from 13.3 lactic acid 1.4 FOBT positive.  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  Septic arthritis of the tibiotalar subtalar and talar navicular joints.  Diffuse soft tissue swelling and subcutaneous edema.  Gastroenterology services consulted for rectal bleeding FOBT positive.  Patient underwent colonoscopy EGD 10/19/2021 per Dr. Lorenso Courier that examined portion of ileum was normal there was a single solitary ulcer at the ileocecal valve.  Nonbleeding internal hemorrhoids.  Patient was placed on PPI twice daily x8 weeks.  Patient's aspirin Plavix initially held and since resumed 10/22/2021 with latest hemoglobin 7.9.  Follow-up orthopedic service Dr. Sharol Given in regards to osteomyelitis left ankle limb was not felt to be salvageable underwent left BKA 10/21/2021 and wound VAC applied.  Noted AKI baseline creatinine 1.3 presentation 1.74-1.98-2.37 did receive IV fluids x10 hours and monitored.  Therapy evaluations completed due to patient decreased functional mobility was admitted for a comprehensive rehab program.   Hospital Course: [name] [name] was admitted to rehab 10/25/2021 for inpatient therapies to consist of PT, ST and OT at least three hours five days a week. Past admission physiatrist, therapy team and rehab RN have worked together to provide customized collaborative inpatient rehab.  Pertaining to patient's left BKA wound VAC had been discontinued he still had some purulent drainage wound care as advised per orthopedic services.  History of CVA aspirin and Plavix have since been resumed.  Acute blood loss anemia upper and lower GI bleed gastric erosions PPI x8 weeks with hemoglobin stable 9.5.  AKI stable latest creatinine 1.21 he did complete a course of IV fluids early in his hospital course.  Blood pressure somewhat soft and monitored hydralazine discontinued Toprol decreased to 25 mg daily.  He did have a history of heart failure remained on  low-dose Demadex monitoring for any signs of  fluid overload.  Blood sugars controlled hemoglobin A1c 7.3 with insulin therapy as directed.  Hyperlipidemia Lipitor as advised.  He did have a history of gout monitoring for any gout flareup continued on Zyloprim.  Patient did have some persistent scrotal swelling with elevation provided patient stated this was chronic he will continue Demadex.  There was reports of some urinary frequency without dysuria orders for urinalysis placed 11/17/2021.   Blood pressures were monitored on TID basis and soft and monitored  Diabetes has been monitored with ac/hs CBG checks and SSI was use prn for tighter BS control.    Rehab course: During patient's stay in rehab weekly team conferences were held to monitor patient's progress, set goals and discuss barriers to discharge. At admission, patient required moderate assist lateral scoot transfers moderate assist supine to sit  Physical exam.  Blood pressure 105/65 pulse 69 temperature 94 respirations 19 oxygen saturation 95% room air Constitutional.  Ill-appearing HEENT Head.  Normocephalic and atraumatic.  Missing many teeth poor dentition Eyes.  Pupils round and reactive to light no discharge without nystagmus Neck.  Supple nontender no JVD without thyromegaly Cardiac regular rate rhythm any extra sounds or murmur heard Abdomen.  Soft nontender positive bowel sounds without rebound Respiratory effort normal no respiratory distress without wheeze Genitourinary.  Comments.  Swollen scrotum Musculoskeletal.  Left upper extremity 5/5 Right upper extremity 4+/5 in same muscles Left lower extremity 2/5 in hip flexors knee extension Right lower  extremity hip flexion knee extension knee flexion 2/5 dorsi plantarflexion 4 -/5 Skin.  Left BKA dressed appropriately tender Neurologic.  Alert arousable provides name and age  He/She  has had improvement in activity tolerance, balance, postural control as well as ability to  compensate for deficits. He/She has had improvement in functional use RUE/LUE  and RLE/LLE as well as improvement in awareness.  Total assist to don pants at bed level with min max assist to roll.  Maxi sky used to transfer to wheelchair for time management.  Propels wheelchair supervision.  Moderate assist to help patient scoot back into his seat.  Set up slide board for patient transfer wheelchair to bed plus to assist for sliding board to bed.  Due to patient's limited advances as well as limited assistance at home skilled nursing facility was recommended.       Disposition: Discharged to skilled nursing facility   Diet: Carb modified  Special Instructions: No smoking or alcohol  Wound care.  Right leg apply Aquacel and cover with ABD pads and Kerlix.  Moistened with normal saline each time to remove and pat dry with 4 x 4's before applying dressings.  Change dressing daily to legs.  4 x 4 Ace wrap and Kerlix to BKA site change daily as needed   Follow-up results of urinalysis  Medications at discharge 1.  Tylenol as needed 2.  Zyloprim 300 mg p.o. daily 3.  Vitamin C 1000 mg p.o. daily 4.  Aspirin 81 mg p.o. daily 5.  Lipitor 10 mg p.o. daily 6.  Plavix 75 mg p.o. daily 7.  Colace 100 mg p.o. twice daily 8.  Ferrous sulfate 325 mg p.o. daily 9.  Neurontin 100 mg p.o. nightly 10.  Semglee 11 units subcutaneous daily 11.  Toprol-XL 25 mg p.o. daily 12.  Protonix 40 mg p.o. twice daily 13.  MiraLAX twice daily hold for loose stools 14.  Klor-Con 10 mEq p.o. daily 15.  Demadex 20 mg p.o. daily  30-35 minutes were spent completing discharge summary and discharge planning     Contact information for follow-up providers     [name], MD Follow up.   Specialty: Orthopedic Surgery Why: Call for appointment Contact information: [place] [address] [phone]         Sharyn Creamer, MD Follow up.   Specialty: Gastroenterology Why: Call for  appointment Contact information: [place] [address] [phone]         Izora Ribas, MD Follow up.   Specialty: Physical Medicine and Rehabilitation Why: 05/02/22 please arrive at 12:40pm for 1:00pm appointment, thank you! Contact information: [address] [place] [address] [phone]              Contact information for after-discharge care     [facility] .   Service: Skilled Nursing Contact information: [address] [place] [phone]                     Signed: Cathlyn Parsons 11/16/2021, 8:33 AM

## 2021-11-16 NOTE — Progress Notes (Signed)
Patient ID: [name] [name], male   DOB: 27-Jun-1956, 66 y.o.   MRN: [id number] Pt aware of three bed offers [facility]. His girlfriend toured [facility] last night and they have accepted the bed offer for [place] [facility]. Will let staff now he will transfer tomorrow, no COVID test needed. Pt aware will be transferred in am ?

## 2021-11-16 NOTE — Progress Notes (Signed)
Inpatient Rehabilitation Care Coordinator ?Discharge Note  ? ?Patient Details  ?Name: [name] [name] ?MRN: [id number] ?Date of Birth: [birth year] ? ? ?Discharge location: GOING TO [facility] ? ?Length of Stay: 23 DAYS ? ?Discharge activity level: MOD/MAX LEVEL ? ?Home/community participation: ACTIVE ? ?Patient response OB:SJGGEZ Literacy - How often do you need to have someone help you when you read instructions, pamphlets, or other written material from your doctor or pharmacy?: Never ? ?Patient response MO:QHUTML Isolation - How often do you feel lonely or isolated from those around you?: Rarely ? ?Services provided included: MD, RD, PT, OT, RN, CM, TR, Pharmacy, SW ? ?Financial Services:  ?Charity fundraiser Utilized: Medicare ?  ? ?Choices offered to/list presented to: PT AND GIRLFREIND ? ?Follow-up services arranged:  ?Other (Comment) (SNF) ?   ?  ?  ?  ? ?Patient response to transportation need: ?Is the patient able to respond to transportation needs?: Yes ?In the past 12 months, has lack of transportation kept you from medical appointments or from getting medications?: No ?In the past 12 months, has lack of transportation kept you from meetings, work, or from getting things needed for daily living?: No ? ? ? ?Comments (or additional information): PT NEEDS MORE [facility]. CONTINUES TO REQUIRE 24/7 PHYSICAL CARE AND NEEDS WOUND MONITORED FOR HEALING ? ?Patient/Family verbalized understanding of follow-up arrangements:  Yes ? ?Individual responsible for coordination of the follow-up plan: CASSIE-DAUGHTER [phone] ? ?Confirmed correct DME delivered: Elease Hashimoto 11/16/2021   ? ?Elease Hashimoto ?

## 2021-11-16 NOTE — Progress Notes (Signed)
Occupational Therapy Session Note ? ?Patient Details  ?Name: [name] [name] ?MRN: [id number] ?Date of Birth: 12/01/1955 ? ?Today's Date: 11/16/2021 ?OT Individual Time: 1130-1200 ?OT Individual Time Calculation (min): 30 min  ? ? ?Short Term Goals: ?Week 3:  OT Short Term Goal 1 (Week 3): continuation of working towards LTGs - LOS extended due to delay in SNF placement ? ?Skilled Therapeutic Interventions/Progress Updates:  ?  Pt semi reclined in bed, no c/o pain, agreeable to bed level therex to promote BUE strengthening for increased ease with functional transfers and self care.  Pt completed x 10 reps rope climbs using gait belt attached to foot of bed.  He then completed shoulder abduction and ER bilaterally using green moderate resistance band 3 x 15 reps each.  Pt required frequent cueing for pursed lip breathing throughout therex due to pt holding breath.  Also needing frequent cueing to encourage increased use of RUE during exercises due to primarily moving LUE and only using right as anchor.  Call bell in reach, bed alarm on. ? ?Therapy Documentation ?Precautions:  ?Precautions ?Precautions: Fall ?Precaution Comments: new L BKA, pressure sores on L/R bottom, would vac ?Restrictions ?Weight Bearing Restrictions: No ?LLE Weight Bearing: Non weight bearing ? ? ? ?Therapy/Group: Individual Therapy ? ?Caryl Asp Makisha Marrin ?11/16/2021, 12:33 PM ?

## 2021-11-16 NOTE — Progress Notes (Signed)
Occupational Therapy Discharge Summary ? ?Patient Details  ?Name: [name] [name] ?MRN: [id number] ?Date of Birth: 1955-10-25 ? ?Today's Date: 11/16/2021 ?OT Individual Time: 1000-1045 ?OT Individual Time Calculation (min): 45 min  ? ? ?Patient has met 5 of 5 long term goals due to improved activity tolerance, improved balance, postural control, and ability to compensate for deficits.  Patient to discharge at overall Mod Assist level bed level ADLs  for LB self care. Set up UB self care. Patient's care partner unavailable to for provide the necessary physical assistance at discharge.   ? ?Reasons goals not met: n/a ? ?Recommendation:  ?Patient will benefit from ongoing skilled OT services in skilled nursing facility setting to continue to advance functional skills in the area of Reduce care partner burden. ? ?Equipment: ?No equipment provided ? ?Reasons for discharge: treatment goals met ? ?Patient/family agrees with progress made and goals achieved: Yes ? ?OT Discharge ?Precautions/Restrictions  ?Precautions ?Precautions: Fall ?Precaution Comments: fall ?Restrictions ?Weight Bearing Restrictions: No ?LLE Weight Bearing: Non weight bearing ? ?Pain ? No c/o pain during OT session ?ADL ?ADL ?Eating: Independent ?Grooming: Setup ?Upper Body Bathing: Setup ?Where Assessed-Upper Body Bathing: Edge of bed ?Lower Body Bathing: Minimal assistance ?Where Assessed-Lower Body Bathing: Bed level ?Upper Body Dressing: Setup ?Where Assessed-Upper Body Dressing: Edge of bed ?Lower Body Dressing: Moderate assistance ?Where Assessed-Lower Body Dressing: Bed level ?Toileting: Unable to assess ?Toilet Transfer: Unable to assess ?Tub/Shower Transfer: Unable to assess ?Walk-In Shower Transfer: Unable to assess ?Vision ?Baseline Vision/History: 1 Wears glasses ?Patient Visual Report: No change from baseline ?Vision Assessment?: No apparent visual deficits ?Perception  ?Perception: Within Functional Limits ?Praxis ?Praxis:  Intact ?Cognition ?Overall Cognitive Status: Within Functional Limits for tasks assessed ?Arousal/Alertness: Awake/alert ?Orientation Level: Oriented X4 ?Year: 2023 ?Month: March ?Day of Week: Correct ?Memory: Appears intact ?Immediate Memory Recall: Sock;Blue;Bed ?Memory Recall Sock: Without Cue ?Memory Recall Blue: Without Cue ?Memory Recall Bed: Without Cue ?Awareness: Appears intact ?Problem Solving: Appears intact ?Safety/Judgment: Appears intact ?Sensation ?Sensation ?Light Touch: Appears Intact ?Hot/Cold: Appears Intact ?Proprioception: Appears Intact ?Stereognosis: Appears Intact ?Additional Comments: phantom pain/sensation to LRL ?Coordination ?Gross Motor Movements are Fluid and Coordinated: No ?Fine Motor Movements are Fluid and Coordinated: No ?Coordination and Movement Description: limited AROM RLE due to weakness and edema, limited AROM L at hip/knee due to pain/weakness ?Finger Nose Finger Test: slowed and limited ROM R UE due to previous CVA ?Heel Shin Test: limited AROM R LE ?Motor  ?Motor ?Motor: Hemiplegia;Abnormal postural alignment and control ?Motor - Skilled Clinical Observations: 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 in 04 ?Motor - Discharge Observations: 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 ?Mobility  ?Bed Mobility ?Bed Mobility: Rolling Right;Rolling Left;Supine to Sit;Sit to Supine ?Rolling Right: Maximal Assistance - Patient 25-49% ?Supine to Sit: Maximal Assistance - Patient - Patient 25-49% ?Sit to Supine: Maximal Assistance - Patient 25-49%  ?Trunk/Postural Assessment  ?Cervical Assessment ?Cervical Assessment: Within Functional Limits ?Thoracic Assessment ?Thoracic Assessment: Exceptions to [facility] ?Lumbar Assessment ?Lumbar Assessment: Exceptions to [facility] ?Postural Control ?Postural Control: Deficits on evaluation ?Righting Reactions: delayed and inadequate ?Protective Responses: delayed and inadequate  ?Balance ?Balance ?Balance Assessed: Yes ?Static Sitting Balance ?Static Sitting - Balance  Support: Bilateral upper extremity supported ?Static Sitting - Level of Assistance: 5: Stand by assistance ?Dynamic Sitting Balance ?Dynamic Sitting - Balance Support: Bilateral upper extremity supported;During functional activity ?Dynamic Sitting - Level of Assistance: 4: Min assist ?Extremity/Trunk Assessment ?RUE Assessment ?Active Range of Motion (AROM) Comments: roughly 3+/5  for AROM, limited by old CVA approx 20 years ago ?LUE Assessment ?LUE Assessment: Within Functional Limits ? ? ?[place] ?11/16/2021, 12:51 PM ?

## 2021-11-16 NOTE — Progress Notes (Signed)
Physical Therapy Discharge Summary ? ?Patient Details  ?Name: [name] [name] ?MRN: [id number] ?Date of Birth: 06/28/1956 ? ?Today's Date: 11/16/2021 ?PT Individual Time: 8016-5537 ?PT Individual Time Calculation (min): 55 min  ? ?Daily session: Patient received reclined in bed, agreeable to PT. He denies pain. RN present for dressing change since sheets and shrinker soiled from wound drainage. PT assisting with positioning and limb management throughout dressing change. Wound appearing purulent with poorly approximated edges, active bleeding at sites. Limb just distal to knee through remainder of residual limb appearing dusky blue with dry, friable skin. Skin tear from use of shrinker sock in medial popliteal fossa enlarging as well. Wound dressed and shrinker sock donned. Patient completing the following bed level therex: LAQ, SLR, quad sets, ankle pumps. PT providing patient with HEP. Patient remaining in bed, bed alarm on, call light within reach.  ? ?Patient has met 1 of 5 long term goals due to improved balance, increased strength, increased range of motion, decreased pain, and ability to compensate for deficits.  Patient to discharge at a wheelchair level Total Assist.   Patient's care partner unavailable to provide the necessary physical assistance at discharge. ? ?Reasons goals not met: Patient with sudden d/c to SNF- unable to adjust goals as appropriate due to this. Patient also with limited effort put forth during therapy sessions throughout length of stay ? ?Recommendation:  ?Patient will benefit from ongoing skilled PT services in skilled nursing facility setting to continue to advance safe functional mobility, address ongoing impairments in sitting balance, transfers, functional strength, and minimize fall risk. ? ?Equipment: ?No equipment provided ? ?Reasons for discharge: discharge from hospital ? ?Patient/family agrees with progress made and goals achieved: Yes ? ?PT  Discharge ?Precautions/Restrictions ?  ?Vital Signs ?Therapy Vitals ?Temp: 98.2 ?F (36.8 ?C) ?Temp Source: Oral ?Pulse Rate: 73 ?Resp: 16 ?BP: (!) 153/97 ?Patient Position (if appropriate): Lying ?Oxygen Therapy ?SpO2: 92 % ?O2 Device: Room Air ?Pain ?Pain Assessment ?Pain Scale: 0-10 ?Pain Score: 0-No pain ?Pain Interference ?Pain Interference ?Pain Effect on Sleep: 0. Does not apply - I have not had any pain or hurting in the past 5 days ?Pain Interference with Therapy Activities: 1. Rarely or not at all ?Pain Interference with Day-to-Day Activities: 1. Rarely or not at all ?Vision/Perception  ?Vision - History ?Ability to See in Adequate Light: 0 Adequate ?Perception ?Perception: Within Functional Limits ?Praxis ?Praxis: Intact  ?Cognition ?Overall Cognitive Status: Within Functional Limits for tasks assessed ?Arousal/Alertness: Awake/alert ?Orientation Level: Oriented X4 ?Memory: Appears intact ?Awareness: Appears intact ?Problem Solving: Appears intact ?Safety/Judgment: Appears intact ?Sensation ?Sensation ?Light Touch: Appears Intact ?Hot/Cold: Appears Intact ?Proprioception: Appears Intact ?Stereognosis: Appears Intact ?Additional Comments: phantom pain/sensation to LRL ?Coordination ?Gross Motor Movements are Fluid and Coordinated: No ?Fine Motor Movements are Fluid and Coordinated: No ?Coordination and Movement Description: limited AROM RLE due to weakness and edema, limited AROM L at hip/knee due to pain/weakness ?Finger Nose Finger Test: slowed and limited ROM R UE due to previous CVA ?Heel Shin Test: limited AROM R LE ?Motor  ?Motor ?Motor: Hemiplegia;Abnormal postural alignment and control ?Motor - Skilled Clinical Observations: 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Ambulation: No ?Gait ?Gait: No ?Stairs / Additional Locomotion ?Stairs: No ?Wheelchair Mobility ?Wheelchair Mobility: Yes ?Wheelchair Assistance: Supervision/Verbal cueing ?Wheelchair Propulsion: Both upper extremities ?Wheelchair Parts Management: Supervision/cueing ?Distance: 100  ?Trunk/Postural Assessment  ?Cervical Assessment ?Cervical Assessment: Within Functional Limits ?Thoracic Assessment ?Thoracic Assessment: Exceptions to [facility] ?Lumbar Assessment ?Lumbar Assessment: Exceptions to [facility] ?Postural Control ?Postural Control: Deficits on evaluation ?Righting Reactions: delayed and inadequate ?Protective Responses: delayed and inadequate  ?Balance ?Balance ?Balance Assessed: Yes ?Static Sitting Balance ?Static Sitting - Balance Support: Bilateral upper extremity supported ?Static Sitting - Level of Assistance: 5: Stand by assistance ?Dynamic Sitting Balance ?Dynamic Sitting - Balance Support: Bilateral upper extremity supported;During functional activity ?Dynamic Sitting - Level of Assistance: 4: Min assist ?Extremity Assessment  ?  ?  ?RLE Assessment ?RLE Assessment: Exceptions to [facility] ?RLE Strength ?Right Hip Flexion: 2/5 ?Right Hip ABduction: 2/5 ?Right Hip ADduction: 2/5 ?Right Knee Flexion: 2/5 ?Right Knee Extension: 2/5 ?Right Ankle Dorsiflexion: 2/5 ?Right Ankle Plantar Flexion: 2/5 ?LLE Assessment ?LLE Assessment: Exceptions to [facility] ?LLE Strength ?Left Hip Flexion: 2/5 ?Left Hip ABduction: 2/5 ?Left Hip ADduction: 2/5 ?Left Knee Flexion: 2/5 ?Left Knee Extension: 2/5 ? ? ? ?Debbora Dus ?11/16/2021, 9:12 AM ?

## 2021-11-16 NOTE — Progress Notes (Signed)
?                                                       PROGRESS NOTE ? ? ?Subjective/Complaints: ?No new complaints today.  ?Discussed that he has a SNF placement for tomorrow. ?Denies pain ? ?ROS:  ? ?Pt denies SOB, abd pain, CP, N/V/C/D, and vision changes, pain ? ? ?Objective: ?  ?No results found. ?No results for input(s): WBC, HGB, HCT, PLT in the last 72 hours. ? ? ?Recent Labs  ?  11/14/21 ?1256  ?NA 140  ?K 4.1  ?CL 97*  ?CO2 33*  ?GLUCOSE 150*  ?BUN 20  ?CREATININE 1.21  ?CALCIUM 8.9  ? ? ? ?Intake/Output Summary (Last 24 hours) at 11/16/2021 1824 ?Last data filed at 11/16/2021 0749 ?Gross per 24 hour  ?Intake 720 ml  ?Output 800 ml  ?Net -80 ml  ?  ? ?Pressure Injury 10/15/21 Buttocks Right;Left Stage 2 -  Partial thickness loss of dermis presenting as a shallow open injury with a red, pink wound bed without slough. patchy areas of partial thickness skin loss related to moiture associated skin damage (Active)  ?10/15/21 1635  ?Location: Buttocks  ?Location Orientation: Right;Left  ?Staging: Stage 2 -  Partial thickness loss of dermis presenting as a shallow open injury with a red, pink wound bed without slough.  ?Wound Description (Comments): patchy areas of partial thickness skin loss related to moiture associated skin damage  ?Present on Admission: Yes  ? ? ?Physical Exam: ?Vital Signs ?Blood pressure (!) 153/97, pulse 73, temperature 98.2 ?F (36.8 ?C), temperature source Oral, resp. rate 16, height 5\' 10"  (1.778 m), weight (!) 148.7 kg, SpO2 92 %. ?General: awake, alert, appropriate.  Morbidly obese. BMI 47.04 ?HENT: conjugate gaze; oropharynx moist ?CV: regular rate; no JVD ?Pulmonary: CTA B/L; no W/R/R.  + [place]. ?GI: soft, NT, ND, (+)BS ?Psychiatric: appropriate ?Neurological: Alert ?Bilateral lower extremity edema. ?Motor: Bilateral lower extremities 4 -/5 proximal distal ?Skin: Multiple wounds  ? ? ? ?Assessment/Plan: ?1. Functional deficits which require 3+ hours per day of interdisciplinary therapy in  a comprehensive inpatient rehab setting. ?Physiatrist is providing close team supervision and 24 hour management of active medical problems listed below. ?Physiatrist and rehab team continue to assess barriers to discharge/monitor patient progress toward functional and medical goals ? ?Care Tool: ? ?Bathing ?   ?Body parts bathed by patient: Right arm, Left arm, Chest, Abdomen, Right upper leg, Left upper leg, Face, Front perineal area  ? Body parts bathed by helper: Buttocks, Right lower leg ?Body parts n/a: Left lower leg ?  ?Bathing assist Assist Level: Minimal Assistance - Patient > 75% ?  ?  ?Upper Body Dressing/Undressing ?Upper body dressing   ?What is the patient wearing?: Pull over shirt ?   ?Upper body assist Assist Level: Set up assist ?   ?Lower Body Dressing/Undressing ?Lower body dressing ? ? ?   ?What is the patient wearing?: Pants ? ?  ? ?Lower body assist Assist for lower body dressing: Moderate Assistance - Patient 50 - 74% ?   ? ?Toileting ?Toileting    ?Toileting assist Assist for toileting: 2 Helpers ?  ?  ?Transfers ?Chair/bed transfer ? ?Transfers assist ?   ? ?Chair/bed transfer assist level: 2 Helpers ?  ?  ?Locomotion ?Ambulation ? ? ?Ambulation assist ? ?  Ambulation activity did not occur: Safety/medical concerns ? ?  ?  ?   ? ?Walk 10 feet activity ? ? ?Assist ? Walk 10 feet activity did not occur: Safety/medical concerns ? ?  ?   ? ?Walk 50 feet activity ? ? ?Assist Walk 50 feet with 2 turns activity did not occur: Safety/medical concerns ? ?  ?   ? ? ?Walk 150 feet activity ? ? ?Assist Walk 150 feet activity did not occur: Safety/medical concerns ? ?  ?  ?  ? ?Walk 10 feet on uneven surface  ?activity ? ? ?Assist Walk 10 feet on uneven surfaces activity did not occur: Safety/medical concerns ? ? ?  ?   ? ?Wheelchair ? ? ? ? ?Assist Is the patient using a wheelchair?: Yes ?Type of Wheelchair: Manual ?  ? ?Wheelchair assist level: Supervision/Verbal cueing ?Max wheelchair distance: 100   ? ? ?Wheelchair 50 feet with 2 turns activity ? ? ? ?Assist ? ?  ?  ? ? ?Assist Level: Supervision/Verbal cueing  ? ?Wheelchair 150 feet activity  ? ? ? ?Assist ?   ? ? ?Assist Level: Moderate Assistance - Patient 50 - 74%  ? ?Blood pressure (!) 153/97, pulse 73, temperature 98.2 ?F (36.8 ?C), temperature source Oral, resp. rate 16, height 5\' 10"  (1.778 m), weight (!) 148.7 kg, SpO2 92 %. ? ?Medical Problem List and Plan: ?1. 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            Continue CIR ? Consulted ortho for re-evaulation of wound today.  ? Messaged April to schedule HFU ?2.  Antithrombotics: ?-DVT/anticoagulation:  Mechanical: Antiembolism stockings, thigh (TED hose) Right lower extremity ?            -antiplatelet therapy: Aspirin 81 mg daily and Plavix 75 mg daily ?3. Residual limb/phantom limb pain: Denies: discontinue hydrocodone.  ?-discontinue gabapentin. ?4. Mood: Provide emotional support ?            -antipsychotic agents: N/A ?5. Neuropsych: This patient is capable of making decisions on his own behalf. ?6. Skin/Wound Care: Routine skin checks.  MASD on buttocks and lymphedema/venous stasis ulcers on RLE- was told Pressure ulcer on buttocks, but not per WOC or nursing.   WOC follow-up wound care as directed. ? Wound VAC off ? Dressing changes per Ortho-Daily soap cleansing, drying, gauze and a Ace wraps over for XL shrinker. ?7. Fluids/Electrolytes/Nutrition: Routine in and outs with follow-up chemistries ?8.  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  Status post EGD and colonoscopy 10/19/2021.  Continue PPI x8 weeks. ?9.  Acute blood loss anemia.  ?CBC Latest Ref Rng & Units 11/10/2021 10/31/2021 10/25/2021  ?WBC 4.0 - 10.5 K/uL 6.5 7.9 8.6  ?Hemoglobin 13.0 - 17.0 g/dL 9.5(L) 7.8(L) 7.6(L)  ?Hematocrit 39.0 - 52.0 % 33.0(L) 26.5(L) 25.1(L)  ?Platelets 150 - 400 K/uL 176 243 229  ?  ? -fe++ supp ? Hemoglobin 9.5 on 2/23,  monitor in SNF ?10.  History of CVA.  Aspirin Plavix as prior to admission. ?11.  AKI.   ?Normalized. Monitor in SNF ?12.  Hypertension.  Blood pressure somewhat soft with hydralazine discontinued Toprol decreased to 25 mg daily to help perfusion for kidneys. ?  ?Vitals:  ? 11/15/21 1925 11/16/21 0536  ?BP: 140/89 (!) 153/97  ?Pulse: 79 73  ?Resp: 16 16  ?Temp: 98.6 ?F (37 ?C) 98.2 ?F (36.8 ?C)  ?SpO2: 92% 92%  ?Start  torsemide  and monitor, increased on 2/25- Cr reviewed and stable on 1.21. ?13.  Diabetes mellitus type II with hyperglycemia.  Hemoglobin A1c 7.3. Increase Semglee to 12U ? CBG (last 3)  ?Recent Labs  ?  11/16/21 ?0611 11/16/21 ?1155 11/16/21 ?1702  ?GLUCAP 137* 147* 146*  ?   ?14.  Morbid obesity.  BMI 50.22.  Dietary follow-up ?15.  Hyperlipidemia.  Lipitor ?16.  History of gout.  Zyloprim 300 mg daily.  Monitor for any gout flareups ?17.  Acute on chronic diastolic congestive heart failure.    ?Filed Weights  ? 11/15/21 0318 11/15/21 1925 11/16/21 0536  ?Weight: (!) 151 kg (!) 148.7 kg (!) 148.7 kg  ?Stable on 2/27 ?18. R hand swelling- suggest K tape for R hand, elevation ? 2/12- ktape off- but swelling almost resolved ?19. Scrotal swelling- elevation/ towel--improving ?  2/22- is stable and chronic per pt. ?2/23- size of grapefruit, but chronic  ? ? ?LOS: ?22 days ?A FACE TO FACE EVALUATION WAS PERFORMED ? ?Martha Clan P Jevan Gaunt ?11/16/2021, 6:24 PM  ? ?  ?

## 2021-11-16 NOTE — Progress Notes (Signed)
Inpatient Rehabilitation Discharge Medication Review by a Pharmacist ? ?A complete drug regimen review was completed for this patient to identify any potential clinically significant medication issues. ? ?High Risk Drug Classes Is patient taking? Indication by Medication  ?Antipsychotic No   ?Anticoagulant No   ?Antibiotic No   ?Opioid No   ?Antiplatelet Yes Aspirin, plavix- CVA prophylaxis  ?Hypoglycemics/insulin Yes Semglee- T2DM  ?Vasoactive Medication Yes Toprol, torsemide- hypertension, rate control  ?Chemotherapy No   ?Other Yes Allopurinol- gout prophylaxis ?Lipitor- HLD ?Gabapentin- neuropathic pain ?Protonix- GERD  ? ? ? ?Type of Medication Issue Identified Description of Issue Recommendation(s)  ?Drug Interaction(s) (clinically significant) ?    ?Duplicate Therapy ?    ?Allergy ?    ?No Medication Administration End Date ?    ?Incorrect Dose ?    ?Additional Drug Therapy Needed ?    ?Significant med changes from prior encounter (inform family/care partners about these prior to discharge).    ?Other ?    ? ? ?Clinically significant medication issues were identified that warrant physician communication and completion of prescribed/recommended actions by midnight of the next day:  No ? ?Time spent performing this drug regimen review (minutes):  30 ? ? ?Amair Shrout BS, PharmD, BCPS ?Clinical Pharmacist ?11/16/2021 10:49 AM ? ? ? ? ?

## 2021-11-16 NOTE — Plan of Care (Signed)
?  Problem: RH Bed to Chair Transfers ?Goal: LTG Patient will perform bed/chair transfers w/assist (PT) ?Description: LTG: Patient will perform bed to chair transfers with assistance (PT). ?Outcome: Not Met (add Reason) ?  ?Problem: RH Furniture Transfers ?Goal: LTG Patient will perform furniture transfers w/assist (OT/PT) ?Description: LTG: Patient will perform furniture transfers  with assistance (OT/PT). ?Outcome: Not Met (add Reason) ?  ?Problem: RH Wheelchair Mobility ?Goal: LTG Patient will propel w/c in controlled environment (PT) ?Description: LTG: Patient will propel wheelchair in controlled environment, # of feet with assist (PT) ?Outcome: Not Met (add Reason) ?Goal: LTG Patient will propel w/c in home environment (PT) ?Description: LTG: Patient will propel wheelchair in home environment, # of feet with assistance (PT). ?Outcome: Not Met (add Reason) ?  ?Problem: RH Balance ?Goal: LTG Patient will maintain dynamic sitting balance (PT) ?Description: LTG:  Patient will maintain dynamic sitting balance with assistance during mobility activities (PT) ?Outcome: Completed/Met ?  ?Problem: RH Car Transfers ?Goal: LTG Patient will perform car transfers with assist (PT) ?Description: LTG: Patient will perform car transfers with assistance (PT). ?Outcome: Not Applicable ?  ?

## 2021-11-17 DIAGNOSIS — R2231 Localized swelling, mass and lump, right upper limb: Secondary | ICD-10-CM | POA: Diagnosis not present

## 2021-11-17 DIAGNOSIS — G546 Phantom limb syndrome with pain: Secondary | ICD-10-CM | POA: Diagnosis not present

## 2021-11-17 DIAGNOSIS — I890 Lymphedema, not elsewhere classified: Secondary | ICD-10-CM | POA: Diagnosis not present

## 2021-11-17 DIAGNOSIS — M109 Gout, unspecified: Secondary | ICD-10-CM | POA: Diagnosis not present

## 2021-11-17 DIAGNOSIS — Z7401 Bed confinement status: Secondary | ICD-10-CM | POA: Diagnosis not present

## 2021-11-17 DIAGNOSIS — I83018 Varicose veins of right lower extremity with ulcer other part of lower leg: Secondary | ICD-10-CM | POA: Diagnosis not present

## 2021-11-17 DIAGNOSIS — G4733 Obstructive sleep apnea (adult) (pediatric): Secondary | ICD-10-CM | POA: Diagnosis not present

## 2021-11-17 DIAGNOSIS — R531 Weakness: Secondary | ICD-10-CM | POA: Diagnosis not present

## 2021-11-17 DIAGNOSIS — I872 Venous insufficiency (chronic) (peripheral): Secondary | ICD-10-CM | POA: Diagnosis not present

## 2021-11-17 DIAGNOSIS — L259 Unspecified contact dermatitis, unspecified cause: Secondary | ICD-10-CM | POA: Diagnosis not present

## 2021-11-17 DIAGNOSIS — Z741 Need for assistance with personal care: Secondary | ICD-10-CM | POA: Diagnosis not present

## 2021-11-17 DIAGNOSIS — I6930 Unspecified sequelae of cerebral infarction: Secondary | ICD-10-CM | POA: Diagnosis not present

## 2021-11-17 DIAGNOSIS — I69331 Monoplegia of upper limb following cerebral infarction affecting right dominant side: Secondary | ICD-10-CM | POA: Diagnosis not present

## 2021-11-17 DIAGNOSIS — Z4781 Encounter for orthopedic aftercare following surgical amputation: Secondary | ICD-10-CM | POA: Diagnosis not present

## 2021-11-17 DIAGNOSIS — L97929 Non-pressure chronic ulcer of unspecified part of left lower leg with unspecified severity: Secondary | ICD-10-CM | POA: Diagnosis not present

## 2021-11-17 DIAGNOSIS — I5033 Acute on chronic diastolic (congestive) heart failure: Secondary | ICD-10-CM | POA: Diagnosis not present

## 2021-11-17 DIAGNOSIS — E1165 Type 2 diabetes mellitus with hyperglycemia: Secondary | ICD-10-CM | POA: Diagnosis not present

## 2021-11-17 DIAGNOSIS — L98411 Non-pressure chronic ulcer of buttock limited to breakdown of skin: Secondary | ICD-10-CM | POA: Diagnosis not present

## 2021-11-17 DIAGNOSIS — E1140 Type 2 diabetes mellitus with diabetic neuropathy, unspecified: Secondary | ICD-10-CM | POA: Diagnosis not present

## 2021-11-17 DIAGNOSIS — M6281 Muscle weakness (generalized): Secondary | ICD-10-CM | POA: Diagnosis not present

## 2021-11-17 DIAGNOSIS — S88112D Complete traumatic amputation at level between knee and ankle, left lower leg, subsequent encounter: Secondary | ICD-10-CM | POA: Diagnosis not present

## 2021-11-17 DIAGNOSIS — E662 Morbid (severe) obesity with alveolar hypoventilation: Secondary | ICD-10-CM | POA: Diagnosis not present

## 2021-11-17 DIAGNOSIS — D849 Immunodeficiency, unspecified: Secondary | ICD-10-CM | POA: Diagnosis not present

## 2021-11-17 DIAGNOSIS — J99 Respiratory disorders in diseases classified elsewhere: Secondary | ICD-10-CM | POA: Diagnosis not present

## 2021-11-17 DIAGNOSIS — N492 Inflammatory disorders of scrotum: Secondary | ICD-10-CM | POA: Diagnosis not present

## 2021-11-17 DIAGNOSIS — L89156 Pressure-induced deep tissue damage of sacral region: Secondary | ICD-10-CM | POA: Diagnosis not present

## 2021-11-17 DIAGNOSIS — D500 Iron deficiency anemia secondary to blood loss (chronic): Secondary | ICD-10-CM | POA: Diagnosis not present

## 2021-11-17 DIAGNOSIS — R41841 Cognitive communication deficit: Secondary | ICD-10-CM | POA: Diagnosis not present

## 2021-11-17 LAB — URINALYSIS, ROUTINE W REFLEX MICROSCOPIC
Bilirubin Urine: NEGATIVE
Glucose, UA: NEGATIVE mg/dL
Ketones, ur: NEGATIVE mg/dL
Nitrite: NEGATIVE
Protein, ur: NEGATIVE mg/dL
Specific Gravity, Urine: 1.006 (ref 1.005–1.030)
WBC, UA: >50 WBC/hpf — ABNORMAL HIGH (ref 0–5)
pH: 6 (ref 5.0–8.0)

## 2021-11-17 LAB — GLUCOSE, CAPILLARY: Glucose-Capillary: 143 mg/dL — ABNORMAL HIGH (ref 70–99)

## 2021-11-17 NOTE — Progress Notes (Signed)
Handoff report called to SNF, Xchelle LPN was the nurse who received report ?

## 2021-11-17 NOTE — Progress Notes (Signed)
Pt refused to allow me to change him prior to being loaded up by transporters taking him to SNF. Pt also refused to the drivers as well. ?

## 2021-11-17 NOTE — Progress Notes (Signed)
Patient ID: [name] [name], male   DOB: [birth year], 66 y.o.   MRN: [id number]  Let Liz-RN and nurse tech know pt transferring at 10;00 via PTAR if on time. Pt and family aware.  ?

## 2021-11-20 LAB — URINE CULTURE: Culture: >=100000 — AB

## 2021-11-22 ENCOUNTER — Encounter: Payer: Self-pay | Admitting: Family

## 2021-11-22 ENCOUNTER — Ambulatory Visit (INDEPENDENT_AMBULATORY_CARE_PROVIDER_SITE_OTHER): Payer: Medicare Other | Admitting: Family

## 2021-11-22 DIAGNOSIS — Z89512 Acquired absence of left leg below knee: Secondary | ICD-10-CM

## 2021-11-22 NOTE — Progress Notes (Signed)
? ?Post-Op Visit Note ?  ?Patient: [name] [name]           ?Date of Birth: [birth year]           ?MRN: [id number] ?Visit Date: 11/22/2021 ?PCP: Manon Hilding, MD ? ?Chief Complaint:  ?Chief Complaint  ?Patient presents with  ? Left Leg - Routine Post Op  ?  10/21/2021 left BKA   ? ? ?HPI:  ?HPI ?Patient is a 66 year old gentleman seen status post left below-knee amputation on February 3.  He has been having Xeroform dressing changes at skilled nursing he states his shrinker was too tight his limb protector also did not fit.  He also reports that he was dropped out of a [employer] and landed with direct impact on his residual limb ? ?Is residing at [facility] skilled nursing facility ?Ortho Exam ?On examination of the left residual limb he did have dehiscence of about 50% of his incision this is filled in with proud granulation today.  There is no surrounding erythema or maceration he does have induration from chronic edema he has some distal ulceration with weeping serous drainage.  There is no erythema no warmth no odor ?Visit Diagnoses:  ?1. Left below-knee amputee ([place])   ? ? ?Plan: We will applied a Unna and Dynaflex compression wrap as he cannot tolerate the shrinker and has had previous ulceration from the shrinker rolling down.  This will be changed twice weekly anticipate follow-up in 2 weeks sutures harvested today. ? ?Follow-Up Instructions: Return in about 2 weeks (around 12/06/2021).  ? ?Imaging: ?No results found. ? ?Orders:  ?No orders of the defined types were placed in this encounter. ? ?No orders of the defined types were placed in this encounter. ? ? ? ?PMFS History: ?Patient Active Problem List  ? Diagnosis Date Noted  ? Acute on chronic diastolic (congestive) heart failure ([place])   ? Controlled type 2 diabetes mellitus with hyperglycemia, without long-term current use of insulin ([place])   ? Phantom limb pain ([place])   ? Left below-knee amputee ([place]) 10/25/2021  ? Left leg pain   ? Osteomyelitis of  ankle and foot ([place]) 10/15/2021  ? Anemia 10/15/2021  ? Pressure ulcer 10/15/2021  ? History of CVA (cerebrovascular accident) 10/15/2021  ? Septic arthritis of ankle and foot region ([place]) 10/15/2021  ? AKI (acute kidney injury) ([place]) 10/14/2021  ? Pneumonia due to COVID-19 virus 09/09/2019  ? Hypokalemia 09/09/2019  ? Acute respiratory failure with hypoxia ([place]) 09/09/2019  ? Essential hypertension 10/14/2014  ? Chronic diastolic heart failure ([place]) 10/14/2014  ? Morbid obesity ([place]) 07/22/2013  ? Varicose veins of lower extremities with other complications 50/53/9767  ? OSA (obstructive sleep apnea) 07/22/2013  ? Venous stasis ulcers ([place]) 07/22/2013  ? Diabetes mellitus ([place]) 01/03/2011  ? ?Past Medical History:  ?Diagnosis Date  ? 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 Essential hypertension   ? History of nephrolithiasis   ? History of open leg wound   ? History of stroke July 2004  ? Obstructive sleep apnea   ? TIA (transient ischemic attack)   ? Varicose veins   ? Laser ablation of left great saphenous vein 2009  ?  ?Family History  ?Problem Relation Age of Onset  ? Bone cancer Mother   ? Hypertension Father   ? Pancreatic cancer Maternal Grandmother   ? Stroke Paternal Grandmother   ? Diabetes Paternal Grandfather   ? Heart attack Brother   ? Hypertension Brother   ? Heart  attack Sister   ?  ?Past Surgical History:  ?Procedure Laterality Date  ? AMPUTATION Left 10/21/2021  ? Procedure: LEFT BELOW KNEE AMPUTATION;  Surgeon: [name], MD;  Location: [place];  Service: Orthopedics;  Laterality: Left;  ? BIOPSY  10/19/2021  ? Procedure: BIOPSY;  Surgeon: Sharyn Creamer, MD;  Location: [facility];  Service: Gastroenterology;;  ? COLONOSCOPY N/A 10/19/2021  ? Procedure: COLONOSCOPY;  Surgeon: Sharyn Creamer, MD;  Location: [facility];  Service: Gastroenterology;  Laterality: N/A;  ? CYSTOSCOPY WITH RETROGRADE PYELOGRAM, URETEROSCOPY AND STENT PLACEMENT Right 08/02/2015  ? Procedure: CYSTOSCOPY WITH RETROGRADE PYELOGRAM,  URETEROSCOPY , STONE EXTRACTION AND STENT PLACEMENT;  Surgeon: Cleon Gustin, MD;  Location: [facility];  Service: Urology;  Laterality: Right;  ? ESOPHAGOGASTRODUODENOSCOPY (EGD) WITH PROPOFOL N/A 10/19/2021  ? Procedure: ESOPHAGOGASTRODUODENOSCOPY (EGD) WITH PROPOFOL;  Surgeon: Sharyn Creamer, MD;  Location: [place];  Service: Gastroenterology;  Laterality: N/A;  ? HOLMIUM LASER APPLICATION Right 03/17/1600  ? Procedure: HOLMIUM LASER APPLICATION;  Surgeon: Cleon Gustin, MD;  [facility];  Service: Urology;  Laterality: Right;  ? KIDNEY STONE SURGERY    ? POLYPECTOMY  10/19/2021  ? Procedure: POLYPECTOMY;  Surgeon: Sharyn Creamer, MD;  [facility]COPY;  Service: Gastroenterology;;  ? [place]  ? VEIN SURGERY Left 06/2010  ? Laser ablation  ? ?Social History  ? ?Occupational History  ? Occupation: Medical laboratory scientific officer   ?  Employer: [employer]  ?Tobacco Use  ? Smoking status: Never  ? Smokeless tobacco: Never  ?Substance and Sexual Activity  ? Alcohol use: No  ?  Alcohol/week: 0.0 standard drinks  ? Drug use: No  ? Sexual activity: Not on file  ? ? ?

## 2021-11-22 NOTE — Progress Notes (Signed)
Urine study completed prior to patient's departure for skilled nursing facility showed greater than 100,000 E. coli/Klebsiella.  [facility] contacted and left a message in regards to urinalysis findings with recommendations of antibiotic therapy. ?

## 2021-11-23 DIAGNOSIS — I83018 Varicose veins of right lower extremity with ulcer other part of lower leg: Secondary | ICD-10-CM | POA: Diagnosis not present

## 2021-11-23 DIAGNOSIS — L259 Unspecified contact dermatitis, unspecified cause: Secondary | ICD-10-CM | POA: Diagnosis not present

## 2021-11-30 DIAGNOSIS — L259 Unspecified contact dermatitis, unspecified cause: Secondary | ICD-10-CM | POA: Diagnosis not present

## 2021-11-30 DIAGNOSIS — I83018 Varicose veins of right lower extremity with ulcer other part of lower leg: Secondary | ICD-10-CM | POA: Diagnosis not present

## 2021-12-06 ENCOUNTER — Other Ambulatory Visit: Payer: Self-pay

## 2021-12-06 ENCOUNTER — Encounter: Payer: Self-pay | Admitting: Family

## 2021-12-06 ENCOUNTER — Ambulatory Visit (INDEPENDENT_AMBULATORY_CARE_PROVIDER_SITE_OTHER): Payer: Medicare Other | Admitting: Family

## 2021-12-06 DIAGNOSIS — Z89512 Acquired absence of left leg below knee: Secondary | ICD-10-CM

## 2021-12-06 NOTE — Progress Notes (Signed)
? ?Post-Op Visit Note ?  ?Patient: [name] [name]           ?Date of Birth: 05/31/1956           ?MRN: [id number] ?Visit Date: 12/06/2021 ?PCP: Manon Hilding, MD ? ?Chief Complaint:  ?No chief complaint on file. ? ? ?HPI:  ?HPI ?Patient is a 66 year old gentleman seen status post left below-knee amputation on February 3. ? ?Is residing at [facility] skilled nursing facility.  Has been having unna boot to his residual limb twice weekly ? ?Ortho Exam ?On examination of the left residual limb his incision is well-healed however he does have a large ulcer along his incision laterally this measures 4 centimeters in diameter there is proud granulation with bleeding there is no surrounding erythema no maceration no odor no sign of infection  ? ?visit Diagnoses:  ?No diagnosis found. ? ? ?Plan: Continue with serial compression wrapping.  We will place silver cell over his ulcer he will follow-up in the office in 3 more weeks. ? ?Follow-Up Instructions: No follow-ups on file.  ? ?Imaging: ?No results found. ? ?Orders:  ?No orders of the defined types were placed in this encounter. ? ?No orders of the defined types were placed in this encounter. ? ? ? ?PMFS History: ?Patient Active Problem List  ? Diagnosis Date Noted  ? Acute on chronic diastolic (congestive) heart failure ([place])   ? Controlled type 2 diabetes mellitus with hyperglycemia, without long-term current use of insulin ([place])   ? Phantom limb pain ([place])   ? Left below-knee amputee ([place]) 10/25/2021  ? Left leg pain   ? Osteomyelitis of ankle and foot ([place]) 10/15/2021  ? Anemia 10/15/2021  ? Pressure ulcer 10/15/2021  ? History of CVA (cerebrovascular accident) 10/15/2021  ? Septic arthritis of ankle and foot region ([place]) 10/15/2021  ? AKI (acute kidney injury) ([place]) 10/14/2021  ? Pneumonia due to COVID-19 virus 09/09/2019  ? Hypokalemia 09/09/2019  ? Acute respiratory failure with hypoxia ([place]) 09/09/2019  ? Essential hypertension 10/14/2014  ? Chronic diastolic  heart failure ([place]) 10/14/2014  ? Morbid obesity ([place]) 07/22/2013  ? Varicose veins of lower extremities with other complications 29/79/8921  ? OSA (obstructive sleep apnea) 07/22/2013  ? Venous stasis ulcers ([place]) 07/22/2013  ? Diabetes mellitus ([place]) 01/03/2011  ? ?Past Medical History:  ?Diagnosis Date  ? 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 Essential hypertension   ? History of nephrolithiasis   ? History of open leg wound   ? History of stroke July 2004  ? Obstructive sleep apnea   ? TIA (transient ischemic attack)   ? Varicose veins   ? Laser ablation of left great saphenous vein 2009  ?  ?Family History  ?Problem Relation Age of Onset  ? Bone cancer Mother   ? Hypertension Father   ? Pancreatic cancer Maternal Grandmother   ? Stroke Paternal Grandmother   ? Diabetes Paternal Grandfather   ? Heart attack Brother   ? Hypertension Brother   ? Heart attack Sister   ?  ?Past Surgical History:  ?Procedure Laterality Date  ? AMPUTATION Left 10/21/2021  ? Procedure: LEFT BELOW KNEE AMPUTATION;  Surgeon: [name], MD;  Location: [facility];  Service: Orthopedics;  Laterality: Left;  ? BIOPSY  10/19/2021  ? Procedure: BIOPSY;  Surgeon: Sharyn Creamer, MD;  Location: [place];  Service: Gastroenterology;;  ? COLONOSCOPY N/A 10/19/2021  ? Procedure: COLONOSCOPY;  Surgeon: Sharyn Creamer, MD;  Location: [facility]OSCOPY;  Service: Gastroenterology;  Laterality: N/A;  ? CYSTOSCOPY WITH RETROGRADE PYELOGRAM, URETEROSCOPY AND STENT PLACEMENT Right 08/02/2015  ? Procedure: CYSTOSCOPY WITH RETROGRADE PYELOGRAM, URETEROSCOPY , STONE EXTRACTION AND STENT PLACEMENT;  Surgeon: Cleon Gustin, MD;  [facility];  Service: Urology;  Laterality: Right;  ? ESOPHAGOGASTRODUODENOSCOPY (EGD) WITH PROPOFOL N/A 10/19/2021  ? Procedure: ESOPHAGOGASTRODUODENOSCOPY (EGD) WITH PROPOFOL;  Surgeon: Sharyn Creamer, MD;  Location: [place];  Service: Gastroenterology;  Laterality: N/A;  ? HOLMIUM LASER APPLICATION Right 62/83/1517  ? Procedure: HOLMIUM LASER  APPLICATION;  Surgeon: Cleon Gustin, MD;  [facility];  Service: Urology;  Laterality: Right;  ? KIDNEY STONE SURGERY    ? POLYPECTOMY  10/19/2021  ? Procedure: POLYPECTOMY;  Surgeon: Sharyn Creamer, MD;  [facility]COPY;  Service: Gastroenterology;;  ? [place]  ? VEIN SURGERY Left 06/2010  ? Laser ablation  ? ?Social History  ? ?Occupational History  ? Occupation: Medical laboratory scientific officer   ?  Employer: [employer]  ?Tobacco Use  ? Smoking status: Never  ? Smokeless tobacco: Never  ?Substance and Sexual Activity  ? Alcohol use: No  ?  Alcohol/week: 0.0 standard drinks  ? Drug use: No  ? Sexual activity: Not on file  ? ? ?

## 2021-12-14 DIAGNOSIS — L97929 Non-pressure chronic ulcer of unspecified part of left lower leg with unspecified severity: Secondary | ICD-10-CM | POA: Diagnosis not present

## 2021-12-14 DIAGNOSIS — I83018 Varicose veins of right lower extremity with ulcer other part of lower leg: Secondary | ICD-10-CM | POA: Diagnosis not present

## 2021-12-17 DIAGNOSIS — G546 Phantom limb syndrome with pain: Secondary | ICD-10-CM | POA: Diagnosis not present

## 2021-12-17 DIAGNOSIS — K2990 Gastroduodenitis, unspecified, without bleeding: Secondary | ICD-10-CM | POA: Diagnosis not present

## 2021-12-17 DIAGNOSIS — E662 Morbid (severe) obesity with alveolar hypoventilation: Secondary | ICD-10-CM | POA: Diagnosis not present

## 2021-12-17 DIAGNOSIS — M109 Gout, unspecified: Secondary | ICD-10-CM | POA: Diagnosis not present

## 2021-12-17 DIAGNOSIS — S88112D Complete traumatic amputation at level between knee and ankle, left lower leg, subsequent encounter: Secondary | ICD-10-CM | POA: Diagnosis not present

## 2021-12-17 DIAGNOSIS — K2970 Gastritis, unspecified, without bleeding: Secondary | ICD-10-CM | POA: Diagnosis not present

## 2021-12-17 DIAGNOSIS — E1165 Type 2 diabetes mellitus with hyperglycemia: Secondary | ICD-10-CM | POA: Diagnosis not present

## 2021-12-17 DIAGNOSIS — Z4781 Encounter for orthopedic aftercare following surgical amputation: Secondary | ICD-10-CM | POA: Diagnosis not present

## 2021-12-17 DIAGNOSIS — I6930 Unspecified sequelae of cerebral infarction: Secondary | ICD-10-CM | POA: Diagnosis not present

## 2021-12-17 DIAGNOSIS — I5033 Acute on chronic diastolic (congestive) heart failure: Secondary | ICD-10-CM | POA: Diagnosis not present

## 2021-12-17 DIAGNOSIS — D500 Iron deficiency anemia secondary to blood loss (chronic): Secondary | ICD-10-CM | POA: Diagnosis not present

## 2021-12-17 DIAGNOSIS — D509 Iron deficiency anemia, unspecified: Secondary | ICD-10-CM | POA: Diagnosis not present

## 2021-12-17 DIAGNOSIS — M6281 Muscle weakness (generalized): Secondary | ICD-10-CM | POA: Diagnosis not present

## 2021-12-17 DIAGNOSIS — L98411 Non-pressure chronic ulcer of buttock limited to breakdown of skin: Secondary | ICD-10-CM | POA: Diagnosis not present

## 2021-12-17 DIAGNOSIS — J99 Respiratory disorders in diseases classified elsewhere: Secondary | ICD-10-CM | POA: Diagnosis not present

## 2021-12-17 DIAGNOSIS — I872 Venous insufficiency (chronic) (peripheral): Secondary | ICD-10-CM | POA: Diagnosis not present

## 2021-12-17 DIAGNOSIS — R2231 Localized swelling, mass and lump, right upper limb: Secondary | ICD-10-CM | POA: Diagnosis not present

## 2021-12-17 DIAGNOSIS — I69331 Monoplegia of upper limb following cerebral infarction affecting right dominant side: Secondary | ICD-10-CM | POA: Diagnosis not present

## 2021-12-17 DIAGNOSIS — Z741 Need for assistance with personal care: Secondary | ICD-10-CM | POA: Diagnosis not present

## 2021-12-17 DIAGNOSIS — D849 Immunodeficiency, unspecified: Secondary | ICD-10-CM | POA: Diagnosis not present

## 2021-12-17 DIAGNOSIS — E1140 Type 2 diabetes mellitus with diabetic neuropathy, unspecified: Secondary | ICD-10-CM | POA: Diagnosis not present

## 2021-12-17 DIAGNOSIS — I83018 Varicose veins of right lower extremity with ulcer other part of lower leg: Secondary | ICD-10-CM | POA: Diagnosis not present

## 2021-12-17 DIAGNOSIS — Z86010 Personal history of colonic polyps: Secondary | ICD-10-CM | POA: Diagnosis not present

## 2021-12-17 DIAGNOSIS — N492 Inflammatory disorders of scrotum: Secondary | ICD-10-CM | POA: Diagnosis not present

## 2021-12-17 DIAGNOSIS — I890 Lymphedema, not elsewhere classified: Secondary | ICD-10-CM | POA: Diagnosis not present

## 2021-12-17 DIAGNOSIS — L89156 Pressure-induced deep tissue damage of sacral region: Secondary | ICD-10-CM | POA: Diagnosis not present

## 2021-12-17 DIAGNOSIS — R41841 Cognitive communication deficit: Secondary | ICD-10-CM | POA: Diagnosis not present

## 2021-12-17 DIAGNOSIS — G4733 Obstructive sleep apnea (adult) (pediatric): Secondary | ICD-10-CM | POA: Diagnosis not present

## 2021-12-21 ENCOUNTER — Ambulatory Visit (INDEPENDENT_AMBULATORY_CARE_PROVIDER_SITE_OTHER): Payer: Medicare Other | Admitting: Internal Medicine

## 2021-12-21 ENCOUNTER — Other Ambulatory Visit (INDEPENDENT_AMBULATORY_CARE_PROVIDER_SITE_OTHER): Payer: Medicare Other

## 2021-12-21 ENCOUNTER — Encounter: Payer: Self-pay | Admitting: Internal Medicine

## 2021-12-21 VITALS — BP 150/94 | HR 80

## 2021-12-21 DIAGNOSIS — K2970 Gastritis, unspecified, without bleeding: Secondary | ICD-10-CM

## 2021-12-21 DIAGNOSIS — D509 Iron deficiency anemia, unspecified: Secondary | ICD-10-CM

## 2021-12-21 DIAGNOSIS — K2990 Gastroduodenitis, unspecified, without bleeding: Secondary | ICD-10-CM | POA: Diagnosis not present

## 2021-12-21 DIAGNOSIS — Z86010 Personal history of colonic polyps: Secondary | ICD-10-CM

## 2021-12-21 LAB — IBC + FERRITIN
Ferritin: 26.9 ng/mL (ref 22.0–322.0)
Iron: 23 ug/dL — ABNORMAL LOW (ref 42–165)
Saturation Ratios: 6.4 % — ABNORMAL LOW (ref 20.0–50.0)
TIBC: 357 ug/dL (ref 250.0–450.0)
Transferrin: 255 mg/dL (ref 212.0–360.0)

## 2021-12-21 LAB — CBC WITH DIFFERENTIAL/PLATELET
Basophils Absolute: 0.1 10*3/uL (ref 0.0–0.1)
Basophils Relative: 1.3 % (ref 0.0–3.0)
Eosinophils Absolute: 0.7 10*3/uL (ref 0.0–0.7)
Eosinophils Relative: 8.7 % — ABNORMAL HIGH (ref 0.0–5.0)
HCT: 35.6 % — ABNORMAL LOW (ref 39.0–52.0)
Hemoglobin: 11.4 g/dL — ABNORMAL LOW (ref 13.0–17.0)
Lymphocytes Relative: 11.7 % — ABNORMAL LOW (ref 12.0–46.0)
Lymphs Abs: 0.9 10*3/uL (ref 0.7–4.0)
MCHC: 32.1 g/dL (ref 30.0–36.0)
MCV: 78 fl (ref 78.0–100.0)
Monocytes Absolute: 0.7 10*3/uL (ref 0.1–1.0)
Monocytes Relative: 8.6 % (ref 3.0–12.0)
Neutro Abs: 5.3 10*3/uL (ref 1.4–7.7)
Neutrophils Relative %: 69.7 % (ref 43.0–77.0)
Platelets: 230 10*3/uL (ref 150.0–400.0)
RBC: 4.57 Mil/uL (ref 4.22–5.81)
RDW: 19.4 % — ABNORMAL HIGH (ref 11.5–15.5)
WBC: 7.6 10*3/uL (ref 4.0–10.5)

## 2021-12-21 NOTE — Patient Instructions (Signed)
If you are age 67 or older, your body mass index should be between 23-30. Your There is no height or weight on file to calculate BMI. If this is out of the aforementioned range listed, please consider follow up with your Primary Care Provider. ? ?If you are age 67 or younger, your body mass index should be between 19-25. Your There is no height or weight on file to calculate BMI. If this is out of the aformentioned range listed, please consider follow up with your Primary Care Provider.  ? ?Your provider has requested that you go to the basement level for lab work before leaving today. Press "B" on the elevator. The lab is located at the first door on the left as you exit the elevator.  ? ?Decrease Protonix to 40 mg once daily. ? ?The [facility] providers would like to encourage you to use [facility] to communicate with providers for non-urgent requests or questions.  Due to long hold times on the telephone, sending your provider a message by [facility] may be a faster and more efficient way to get a response.  Please allow 48 business hours for a response.  Please remember that this is for non-urgent requests.  ? ?Due to recent changes in healthcare laws, you may see the results of your imaging and laboratory studies on MyChart before your provider has had a chance to review them.  We understand that in some cases there may be results that are confusing or concerning to you. Not all laboratory results come back in the same time frame and the provider may be waiting for multiple results in order to interpret others.  Please give [place] 48 hours in order for your provider to thoroughly review all the results before contacting the office for clarification of your results.  ? ?It was a pleasure to see you today! ? ?Thank you for trusting me with your gastrointestinal care!   ? ?Christia Reading, MD  ? ?

## 2021-12-21 NOTE — Progress Notes (Signed)
? ?Chief Complaint: Iron deficiency anemia ? ?HPI : 66 year old male w/hx of CVA on asp/plavix, HFpEF, OSA not on CPAP, DM and morbid obesity presents for follow-up of iron deficiency anemia ? ?Patient was recently admitted from 10/25/2021 through 11/17/2021 when he was found to have left leg osteomyelitis.  He was also incidentally noted to have iron deficiency anemia during that hospitalization.  Prior to that hospitalization he had been using extensive amounts of NSAIDs.  He is no longer using any NSAIDs since he has been in rehab therapy. He continues to be on Plavix therapy.  He has been doing well at the rehab center after his left BKA.  He has not noticed any blood in his stools, but he states that he has not been looking at his stools personally.  His bowel habits are regular.  He does not think his stools are atypical.  Denies any nausea, vomiting, dysphagia, abdominal pain.  He has been getting his Protonix 40 mg twice a day and ferrous sulfate supplement daily. ? ?Past Medical History:  ?Diagnosis Date  ? Adenomatous colon polyp   ? 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 Diverticulosis   ? Essential hypertension   ? Gastritis   ? History of nephrolithiasis   ? History of open leg wound   ? History of stroke 03/19/2003  ? Obstructive sleep apnea   ? Peptic ulcer   ? TIA (transient ischemic attack)   ? Varicose veins   ? Laser ablation of left great saphenous vein 2009  ? ? ? ?Past Surgical History:  ?Procedure Laterality Date  ? AMPUTATION Left 10/21/2021  ? Procedure: LEFT BELOW KNEE AMPUTATION;  Surgeon: [name], MD;  Location: [place];  Service: Orthopedics;  Laterality: Left;  ? BIOPSY  10/19/2021  ? Procedure: BIOPSY;  Surgeon: Sharyn Creamer, MD;  Location: [place];  Service: Gastroenterology;;  ? COLONOSCOPY N/A 10/19/2021  ? Procedure: COLONOSCOPY;  Surgeon: Sharyn Creamer, MD;  Location: [place];  Service: Gastroenterology;  Laterality: N/A;  ? CYSTOSCOPY WITH RETROGRADE PYELOGRAM, URETEROSCOPY AND STENT  PLACEMENT Right 08/02/2015  ? Procedure: CYSTOSCOPY WITH RETROGRADE PYELOGRAM, URETEROSCOPY , STONE EXTRACTION AND STENT PLACEMENT;  Surgeon: Cleon Gustin, MD;  [facility];  Service: Urology;  Laterality: Right;  ? ESOPHAGOGASTRODUODENOSCOPY (EGD) WITH PROPOFOL N/A 10/19/2021  ? Procedure: ESOPHAGOGASTRODUODENOSCOPY (EGD) WITH PROPOFOL;  Surgeon: Sharyn Creamer, MD;  Location: [place];  Service: Gastroenterology;  Laterality: N/A;  ? HOLMIUM LASER APPLICATION Right 19/41/7408  ? Procedure: HOLMIUM LASER APPLICATION;  Surgeon: Cleon Gustin, MD;  [facility];  Service: Urology;  Laterality: Right;  ? KIDNEY STONE SURGERY    ? POLYPECTOMY  10/19/2021  ? Procedure: POLYPECTOMY;  Surgeon: Sharyn Creamer, MD;  Location: [facility] ENDOSCOPY;  Service: Gastroenterology;;  ? [place]  ? VEIN SURGERY Left 06/2010  ? Laser ablation  ? ?Family History  ?Problem Relation Age of Onset  ? Bone cancer Mother   ? Hypertension Father   ? Pancreatic cancer Maternal Grandmother   ? Stroke Paternal Grandmother   ? Diabetes Paternal Grandfather   ? Heart attack Brother   ? Hypertension Brother   ? Heart attack Sister   ? ?Social History  ? ?Tobacco Use  ? Smoking status: Never  ? Smokeless tobacco: Never  ?Substance Use Topics  ? Alcohol use: No  ?  Alcohol/week: 0.0 standard drinks  ? Drug use: No  ? ?Current Outpatient Medications  ?Medication Sig Dispense Refill  ?  acetaminophen (TYLENOL) 325 MG tablet Take 1-2 tablets (325-650 mg total) by mouth every 6 (six) hours as needed for mild pain (pain score 1-3 or temp > 100.5).    ? allopurinol (ZYLOPRIM) 300 MG tablet Take 300 mg by mouth daily.    ? ascorbic acid (VITAMIN C) 1000 MG tablet Take 1 tablet (1,000 mg total) by mouth daily.    ? aspirin EC 81 MG tablet Take 1 tablet (81 mg total) by mouth daily. (Patient not taking: Reported on 04/09/2021) 90 tablet 3  ? atorvastatin (LIPITOR) 10 MG tablet Take 10 mg by mouth daily.    ? clopidogrel (PLAVIX) 75 MG  tablet Take 75 mg by mouth daily.    ? docusate sodium (COLACE) 100 MG capsule Take 1 capsule (100 mg total) by mouth 2 (two) times daily. 10 capsule 0  ? ferrous sulfate 325 (65 FE) MG tablet Take 1 tablet (325 mg total) by mouth daily with breakfast.  3  ? gabapentin (NEURONTIN) 100 MG capsule Take 1 capsule (100 mg total) by mouth at bedtime.    ? insulin glargine-yfgn (SEMGLEE) 100 UNIT/ML injection Inject 0.11 mLs (11 Units total) into the skin daily. 10 mL 11  ? metoprolol succinate (TOPROL-XL) 25 MG 24 hr tablet Take 1 tablet (25 mg total) by mouth daily. 30 tablet 0  ? pantoprazole (PROTONIX) 40 MG tablet Take 1 tablet (40 mg total) by mouth 2 (two) times daily. 100 tablet 0  ? polyethylene glycol (MIRALAX / GLYCOLAX) 17 g packet Take 17 g by mouth 2 (two) times daily. 14 each 0  ? potassium chloride (KLOR-CON M) 10 MEQ tablet Take 1 tablet (10 mEq total) by mouth daily.    ? torsemide (DEMADEX) 20 MG tablet Take 1 tablet (20 mg total) by mouth daily.    ? ?No current facility-administered medications for this visit.  ? ?No Known Allergies ? ? ?Review of Systems: ?All systems reviewed and negative except where noted in HPI.  ? ?Physical Exam: ?There were no vitals taken for this visit. ?Constitutional: Pleasant,well-developed, male in no acute distress. ?HEENT: Normocephalic and atraumatic. Conjunctivae are normal. No scleral icterus. ?Cardiovascular: Normal rate, regular rhythm.  ?Pulmonary/chest: Effort normal and breath sounds normal. No wheezing, rales or rhonchi. ?Abdominal: Soft, nondistended, nontender. Bowel sounds active throughout. There are no masses palpable. No hepatomegaly. ?Extremities: Left BKA present ?Neurological: Alert and oriented to person place and time. ?Skin: Skin is warm and dry. No rashes noted. ?Psychiatric: Normal mood and affect. Behavior is normal. ? ?EGD 10/19/21: ?White nummular lesions were noted in the middle third of the esophagus and in the lower third of the esophagus.  Biopsies were taken with a cold forceps for histology. ?Localized severe inflammation characterized by congestion (edema), erosions and erythema was found in the gastric antrum. Biopsies were taken with a cold forceps for histology. ?The examined duodenum was normal. ?Path: ?A. STOMACH, BIOPSY:  ?-  Mild chronic gastritis without activity  ?-  No H. pylori or intestinal metaplasia identified  ?-  Cytokeratin AE1/3 does not highlight an infiltrative epithelial  ?component.  ?B. ESOPHAGUS, BIOPSY:  ?-  Benign squamous mucosa  ?-  No increased intraepithelial eosinophils  ? ?Colonoscopy 10/19/21: ?- The examined portion of the ileum was normal. ?- A single (solitary) ulcer at the ileocecal valve. Biopsied. ?- Seven 3 to 7 mm polyps in the sigmoid colon and in the descending colon, removed with a cold snare. Resected and retrieved. ?- One 6 mm polyp in  the sigmoid colon, removed with a hot snare. Resected and retrieved. ?- Diverticulosis in the sigmoid colon. ?- Non-bleeding internal hemorrhoids. ?Path: ?C. IC VALVE ULCER, BIOPSY:  ?-  Benign colonic mucosa with ulceration and reactive changes  ?-  No viral cytopathic changes, high-grade dysplasia or malignancy identified  ?The histologic features are non-specific and can be seen in low  ?grade ischemia, medication effect and self-limited infectious processes.  ?Clinical-pathologic correlation is recommended. ?D. COLON, DESCENDING, POLYPECTOMY:  ?-  Tubular adenoma (1 fragments)  ?-  Multiple fragments of sessile serrated polyp(s)  ?-  No high-grade dysplasia or malignancy identified  ? ?ASSESSMENT AND PLAN: ?IDA ?Gastritis ?History of colon polyps ?Patient presents for follow-up after recent hospitalization when he was diagnosed with iron deficiency anemia.  He had an EGD and colonoscopy that were performed that showed gastritis as well as an ileocecal valve ulcer.  Patient has been doing well since that hospitalization.  Denies any signs of continued bleeding.  He has  been compliant with his oral iron therapy and PPI therapy. ?- Check CBC, ferritin, iron/TIBC ?- Decrease PPI from BID to QD ?- Continue oral iron supplementation ?- Avoid NSAIDs ?- Recall for repeat colonoscopy 2/

## 2021-12-23 DIAGNOSIS — I83018 Varicose veins of right lower extremity with ulcer other part of lower leg: Secondary | ICD-10-CM | POA: Diagnosis not present

## 2021-12-27 ENCOUNTER — Encounter: Payer: Self-pay | Admitting: Family

## 2021-12-27 ENCOUNTER — Ambulatory Visit (INDEPENDENT_AMBULATORY_CARE_PROVIDER_SITE_OTHER): Payer: Medicare Other | Admitting: Family

## 2021-12-27 DIAGNOSIS — Z89512 Acquired absence of left leg below knee: Secondary | ICD-10-CM

## 2021-12-27 NOTE — Progress Notes (Addendum)
Post-Op Visit Note   Patient: [name] [name]           Date of Birth: May 01, 1956           MRN: [id number] Visit Date: 12/27/2021 PCP: Manon Hilding, MD  Chief Complaint:  Chief Complaint  Patient presents with   Left Leg - Routine Post Op    10/21/21 left BKA    HPI:  HPI Her the patient is a 66 year old gentleman who is seen today status post left below-knee amputation February 3 he is currently residing at skilled nursing he had been having 4-layer compression wraps with alginate dressings to his residual limb.  This has been working quite well.  Patient is a new left transtibial  amputee.  Patient's current comorbidities are not expected to impact the ability to function with the prescribed prosthesis. Patient verbally communicates a strong desire to use a prosthesis. Patient currently requires mobility aids to ambulate without a prosthesis.  Expects not to use mobility aids with a new prosthesis.  Patient is a K2 level ambulator that will use a prosthesis to walk around their home and the community over low level environmental barriers.     Ortho Exam On examination of the left residual limb the incision is well-healed his large ulcer is also healed he does have weeping and erythema there is scattered ulcerations from edema and weeping.  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  Visit Diagnoses: No diagnosis found.  Plan: He will continue daily Dial soap cleansing with dressing changes they will do dressing changes with alginate dressings.  Discussed with him and his wife that he may benefit from a medical compression stocking the vive shrinker have given him an order for his prosthesis set up hope that he could tolerate the below-knee shrinker  Follow-Up Instructions: Return in about 2 months (around 02/26/2022).   Imaging: No results found.  Orders:  No orders of the defined types were placed in this encounter.  No  orders of the defined types were placed in this encounter.    PMFS History: Patient Active Problem List   Diagnosis Date Noted   Acute on chronic diastolic (congestive) heart failure (HCC)    Controlled type 2 diabetes mellitus with hyperglycemia, without long-term current use of insulin (HCC)    Phantom limb pain (HCC)    Left below-knee amputee ([place]) 10/25/2021   Left leg pain    Osteomyelitis of ankle and foot ([place]) 10/15/2021   Anemia 10/15/2021   Pressure ulcer 10/15/2021   History of CVA (cerebrovascular accident) 10/15/2021   Septic arthritis of ankle and foot region ([place]) 10/15/2021   AKI (acute kidney injury) ([place]) 10/14/2021   Pneumonia due to COVID-19 virus 09/09/2019   Hypokalemia 09/09/2019   Acute respiratory failure with hypoxia ([place]) 09/09/2019   Essential hypertension 10/14/2014   Chronic diastolic heart failure ([place]) 10/14/2014   Morbid obesity ([place]) 07/22/2013   Varicose veins of lower extremities with other complications 96/75/9163   OSA (obstructive sleep apnea) 07/22/2013   Venous stasis ulcers ([place]) 07/22/2013   Diabetes mellitus ([place]) 01/03/2011   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[place])    Diverticulosis    Essential hypertension    Gastritis    History of nephrolithiasis    History of open leg wound    History of stroke 03/19/2003   Obstructive sleep apnea    Peptic ulcer  TIA (transient ischemic attack)    Varicose veins    Laser ablation of left great saphenous vein 2009    Family History  Problem Relation Age of Onset   Bone cancer Mother    Hypertension Father    Pancreatic cancer Maternal Grandmother    Stroke Paternal Grandmother    Diabetes Paternal Grandfather    Heart attack Brother    Hypertension Brother    Heart attack Sister     Past Surgical History:  Procedure Laterality Date   AMPUTATION Left 10/21/2021   Procedure: LEFT BELOW KNEE AMPUTATION;  Surgeon: [name], MD;   Location: [place];  Service: Orthopedics;  Laterality: Left;   BIOPSY  10/19/2021   Procedure: BIOPSY;  Surgeon: Sharyn Creamer, MD;  Location: [place];  Service: Gastroenterology;;   COLONOSCOPY N/A 10/19/2021   Procedure: COLONOSCOPY;  Surgeon: Sharyn Creamer, MD;  Location: [place];  Service: Gastroenterology;  Laterality: N/A;   CYSTOSCOPY WITH RETROGRADE PYELOGRAM, URETEROSCOPY AND STENT PLACEMENT Right 08/02/2015   Procedure: CYSTOSCOPY WITH RETROGRADE PYELOGRAM, URETEROSCOPY , STONE EXTRACTION AND STENT PLACEMENT;  Surgeon: Cleon Gustin, MD;  [facility];  Service: Urology;  Laterality: Right;   ESOPHAGOGASTRODUODENOSCOPY (EGD) WITH PROPOFOL N/A 10/19/2021   Procedure: ESOPHAGOGASTRODUODENOSCOPY (EGD) WITH PROPOFOL;  Surgeon: Sharyn Creamer, MD;  Location: [place];  Service: Gastroenterology;  Laterality: N/A;   HOLMIUM LASER APPLICATION Right 85/27/7824   Procedure: HOLMIUM LASER APPLICATION;  Surgeon: Cleon Gustin, MD;  [facility];  Service: Urology;  Laterality: Right;   KIDNEY STONE SURGERY     POLYPECTOMY  10/19/2021   Procedure: POLYPECTOMY;  Surgeon: Sharyn Creamer, MD;  Location: [facility];  Service: Gastroenterology;;   [place] Left 06/2010   Laser ablation   Social History   Occupational History   Occupation: controll room operator     Employer: [employer]  Tobacco Use   Smoking status: Never   Smokeless tobacco: Never  Substance and Sexual Activity   Alcohol use: No    Alcohol/week: 0.0 standard drinks   Drug use: No   Sexual activity: Not on file

## 2022-01-04 DIAGNOSIS — I83018 Varicose veins of right lower extremity with ulcer other part of lower leg: Secondary | ICD-10-CM | POA: Diagnosis not present

## 2022-01-11 DIAGNOSIS — I83018 Varicose veins of right lower extremity with ulcer other part of lower leg: Secondary | ICD-10-CM | POA: Diagnosis not present

## 2022-01-18 DIAGNOSIS — D500 Iron deficiency anemia secondary to blood loss (chronic): Secondary | ICD-10-CM | POA: Diagnosis not present

## 2022-01-18 DIAGNOSIS — I872 Venous insufficiency (chronic) (peripheral): Secondary | ICD-10-CM | POA: Diagnosis not present

## 2022-01-18 DIAGNOSIS — Z89512 Acquired absence of left leg below knee: Secondary | ICD-10-CM | POA: Diagnosis not present

## 2022-01-18 DIAGNOSIS — I110 Hypertensive heart disease with heart failure: Secondary | ICD-10-CM | POA: Diagnosis not present

## 2022-01-18 DIAGNOSIS — L97821 Non-pressure chronic ulcer of other part of left lower leg limited to breakdown of skin: Secondary | ICD-10-CM | POA: Diagnosis not present

## 2022-01-18 DIAGNOSIS — E1165 Type 2 diabetes mellitus with hyperglycemia: Secondary | ICD-10-CM | POA: Diagnosis not present

## 2022-01-18 DIAGNOSIS — Z794 Long term (current) use of insulin: Secondary | ICD-10-CM | POA: Diagnosis not present

## 2022-01-18 DIAGNOSIS — I5033 Acute on chronic diastolic (congestive) heart failure: Secondary | ICD-10-CM | POA: Diagnosis not present

## 2022-01-18 DIAGNOSIS — Z7902 Long term (current) use of antithrombotics/antiplatelets: Secondary | ICD-10-CM | POA: Diagnosis not present

## 2022-01-18 DIAGNOSIS — I69331 Monoplegia of upper limb following cerebral infarction affecting right dominant side: Secondary | ICD-10-CM | POA: Diagnosis not present

## 2022-01-18 DIAGNOSIS — I83018 Varicose veins of right lower extremity with ulcer other part of lower leg: Secondary | ICD-10-CM | POA: Diagnosis not present

## 2022-01-18 DIAGNOSIS — E785 Hyperlipidemia, unspecified: Secondary | ICD-10-CM | POA: Diagnosis not present

## 2022-01-18 DIAGNOSIS — E1140 Type 2 diabetes mellitus with diabetic neuropathy, unspecified: Secondary | ICD-10-CM | POA: Diagnosis not present

## 2022-01-18 DIAGNOSIS — I890 Lymphedema, not elsewhere classified: Secondary | ICD-10-CM | POA: Diagnosis not present

## 2022-01-18 DIAGNOSIS — M109 Gout, unspecified: Secondary | ICD-10-CM | POA: Diagnosis not present

## 2022-01-18 DIAGNOSIS — D849 Immunodeficiency, unspecified: Secondary | ICD-10-CM | POA: Diagnosis not present

## 2022-01-18 DIAGNOSIS — L97812 Non-pressure chronic ulcer of other part of right lower leg with fat layer exposed: Secondary | ICD-10-CM | POA: Diagnosis not present

## 2022-01-18 DIAGNOSIS — G546 Phantom limb syndrome with pain: Secondary | ICD-10-CM | POA: Diagnosis not present

## 2022-01-18 DIAGNOSIS — E662 Morbid (severe) obesity with alveolar hypoventilation: Secondary | ICD-10-CM | POA: Diagnosis not present

## 2022-01-19 DIAGNOSIS — L97821 Non-pressure chronic ulcer of other part of left lower leg limited to breakdown of skin: Secondary | ICD-10-CM | POA: Diagnosis not present

## 2022-01-19 DIAGNOSIS — I83018 Varicose veins of right lower extremity with ulcer other part of lower leg: Secondary | ICD-10-CM | POA: Diagnosis not present

## 2022-01-19 DIAGNOSIS — I5033 Acute on chronic diastolic (congestive) heart failure: Secondary | ICD-10-CM | POA: Diagnosis not present

## 2022-01-19 DIAGNOSIS — L97812 Non-pressure chronic ulcer of other part of right lower leg with fat layer exposed: Secondary | ICD-10-CM | POA: Diagnosis not present

## 2022-01-19 DIAGNOSIS — I110 Hypertensive heart disease with heart failure: Secondary | ICD-10-CM | POA: Diagnosis not present

## 2022-01-19 DIAGNOSIS — I872 Venous insufficiency (chronic) (peripheral): Secondary | ICD-10-CM | POA: Diagnosis not present

## 2022-01-23 DIAGNOSIS — I110 Hypertensive heart disease with heart failure: Secondary | ICD-10-CM | POA: Diagnosis not present

## 2022-01-23 DIAGNOSIS — I5033 Acute on chronic diastolic (congestive) heart failure: Secondary | ICD-10-CM | POA: Diagnosis not present

## 2022-01-23 DIAGNOSIS — L97821 Non-pressure chronic ulcer of other part of left lower leg limited to breakdown of skin: Secondary | ICD-10-CM | POA: Diagnosis not present

## 2022-01-23 DIAGNOSIS — L97812 Non-pressure chronic ulcer of other part of right lower leg with fat layer exposed: Secondary | ICD-10-CM | POA: Diagnosis not present

## 2022-01-23 DIAGNOSIS — I83018 Varicose veins of right lower extremity with ulcer other part of lower leg: Secondary | ICD-10-CM | POA: Diagnosis not present

## 2022-01-23 DIAGNOSIS — I872 Venous insufficiency (chronic) (peripheral): Secondary | ICD-10-CM | POA: Diagnosis not present

## 2022-01-25 DIAGNOSIS — I110 Hypertensive heart disease with heart failure: Secondary | ICD-10-CM | POA: Diagnosis not present

## 2022-01-25 DIAGNOSIS — I872 Venous insufficiency (chronic) (peripheral): Secondary | ICD-10-CM | POA: Diagnosis not present

## 2022-01-25 DIAGNOSIS — L97821 Non-pressure chronic ulcer of other part of left lower leg limited to breakdown of skin: Secondary | ICD-10-CM | POA: Diagnosis not present

## 2022-01-25 DIAGNOSIS — I5033 Acute on chronic diastolic (congestive) heart failure: Secondary | ICD-10-CM | POA: Diagnosis not present

## 2022-01-25 DIAGNOSIS — I83018 Varicose veins of right lower extremity with ulcer other part of lower leg: Secondary | ICD-10-CM | POA: Diagnosis not present

## 2022-01-25 DIAGNOSIS — L97812 Non-pressure chronic ulcer of other part of right lower leg with fat layer exposed: Secondary | ICD-10-CM | POA: Diagnosis not present

## 2022-01-26 DIAGNOSIS — L97812 Non-pressure chronic ulcer of other part of right lower leg with fat layer exposed: Secondary | ICD-10-CM | POA: Diagnosis not present

## 2022-01-26 DIAGNOSIS — L97821 Non-pressure chronic ulcer of other part of left lower leg limited to breakdown of skin: Secondary | ICD-10-CM | POA: Diagnosis not present

## 2022-01-26 DIAGNOSIS — I5033 Acute on chronic diastolic (congestive) heart failure: Secondary | ICD-10-CM | POA: Diagnosis not present

## 2022-01-26 DIAGNOSIS — I83018 Varicose veins of right lower extremity with ulcer other part of lower leg: Secondary | ICD-10-CM | POA: Diagnosis not present

## 2022-01-26 DIAGNOSIS — I110 Hypertensive heart disease with heart failure: Secondary | ICD-10-CM | POA: Diagnosis not present

## 2022-01-26 DIAGNOSIS — I872 Venous insufficiency (chronic) (peripheral): Secondary | ICD-10-CM | POA: Diagnosis not present

## 2022-01-30 DIAGNOSIS — L97812 Non-pressure chronic ulcer of other part of right lower leg with fat layer exposed: Secondary | ICD-10-CM | POA: Diagnosis not present

## 2022-01-30 DIAGNOSIS — I872 Venous insufficiency (chronic) (peripheral): Secondary | ICD-10-CM | POA: Diagnosis not present

## 2022-01-30 DIAGNOSIS — I110 Hypertensive heart disease with heart failure: Secondary | ICD-10-CM | POA: Diagnosis not present

## 2022-01-30 DIAGNOSIS — L97821 Non-pressure chronic ulcer of other part of left lower leg limited to breakdown of skin: Secondary | ICD-10-CM | POA: Diagnosis not present

## 2022-01-30 DIAGNOSIS — I5033 Acute on chronic diastolic (congestive) heart failure: Secondary | ICD-10-CM | POA: Diagnosis not present

## 2022-01-30 DIAGNOSIS — I83018 Varicose veins of right lower extremity with ulcer other part of lower leg: Secondary | ICD-10-CM | POA: Diagnosis not present

## 2022-02-01 DIAGNOSIS — L97812 Non-pressure chronic ulcer of other part of right lower leg with fat layer exposed: Secondary | ICD-10-CM | POA: Diagnosis not present

## 2022-02-01 DIAGNOSIS — I110 Hypertensive heart disease with heart failure: Secondary | ICD-10-CM | POA: Diagnosis not present

## 2022-02-01 DIAGNOSIS — L97821 Non-pressure chronic ulcer of other part of left lower leg limited to breakdown of skin: Secondary | ICD-10-CM | POA: Diagnosis not present

## 2022-02-01 DIAGNOSIS — I5033 Acute on chronic diastolic (congestive) heart failure: Secondary | ICD-10-CM | POA: Diagnosis not present

## 2022-02-01 DIAGNOSIS — I872 Venous insufficiency (chronic) (peripheral): Secondary | ICD-10-CM | POA: Diagnosis not present

## 2022-02-01 DIAGNOSIS — I83018 Varicose veins of right lower extremity with ulcer other part of lower leg: Secondary | ICD-10-CM | POA: Diagnosis not present

## 2022-02-02 DIAGNOSIS — I890 Lymphedema, not elsewhere classified: Secondary | ICD-10-CM | POA: Diagnosis not present

## 2022-02-02 DIAGNOSIS — I5033 Acute on chronic diastolic (congestive) heart failure: Secondary | ICD-10-CM | POA: Diagnosis not present

## 2022-02-02 DIAGNOSIS — I872 Venous insufficiency (chronic) (peripheral): Secondary | ICD-10-CM | POA: Diagnosis not present

## 2022-02-02 DIAGNOSIS — L97821 Non-pressure chronic ulcer of other part of left lower leg limited to breakdown of skin: Secondary | ICD-10-CM | POA: Diagnosis not present

## 2022-02-02 DIAGNOSIS — L97812 Non-pressure chronic ulcer of other part of right lower leg with fat layer exposed: Secondary | ICD-10-CM | POA: Diagnosis not present

## 2022-02-02 DIAGNOSIS — E1165 Type 2 diabetes mellitus with hyperglycemia: Secondary | ICD-10-CM | POA: Diagnosis not present

## 2022-02-02 DIAGNOSIS — I110 Hypertensive heart disease with heart failure: Secondary | ICD-10-CM | POA: Diagnosis not present

## 2022-02-02 DIAGNOSIS — I83018 Varicose veins of right lower extremity with ulcer other part of lower leg: Secondary | ICD-10-CM | POA: Diagnosis not present

## 2022-02-07 DIAGNOSIS — L97821 Non-pressure chronic ulcer of other part of left lower leg limited to breakdown of skin: Secondary | ICD-10-CM | POA: Diagnosis not present

## 2022-02-07 DIAGNOSIS — I872 Venous insufficiency (chronic) (peripheral): Secondary | ICD-10-CM | POA: Diagnosis not present

## 2022-02-07 DIAGNOSIS — L97812 Non-pressure chronic ulcer of other part of right lower leg with fat layer exposed: Secondary | ICD-10-CM | POA: Diagnosis not present

## 2022-02-07 DIAGNOSIS — I5033 Acute on chronic diastolic (congestive) heart failure: Secondary | ICD-10-CM | POA: Diagnosis not present

## 2022-02-07 DIAGNOSIS — I83018 Varicose veins of right lower extremity with ulcer other part of lower leg: Secondary | ICD-10-CM | POA: Diagnosis not present

## 2022-02-07 DIAGNOSIS — I110 Hypertensive heart disease with heart failure: Secondary | ICD-10-CM | POA: Diagnosis not present

## 2022-02-08 DIAGNOSIS — I83018 Varicose veins of right lower extremity with ulcer other part of lower leg: Secondary | ICD-10-CM | POA: Diagnosis not present

## 2022-02-08 DIAGNOSIS — L97812 Non-pressure chronic ulcer of other part of right lower leg with fat layer exposed: Secondary | ICD-10-CM | POA: Diagnosis not present

## 2022-02-08 DIAGNOSIS — I872 Venous insufficiency (chronic) (peripheral): Secondary | ICD-10-CM | POA: Diagnosis not present

## 2022-02-08 DIAGNOSIS — I5033 Acute on chronic diastolic (congestive) heart failure: Secondary | ICD-10-CM | POA: Diagnosis not present

## 2022-02-08 DIAGNOSIS — I110 Hypertensive heart disease with heart failure: Secondary | ICD-10-CM | POA: Diagnosis not present

## 2022-02-08 DIAGNOSIS — L97821 Non-pressure chronic ulcer of other part of left lower leg limited to breakdown of skin: Secondary | ICD-10-CM | POA: Diagnosis not present

## 2022-02-15 DIAGNOSIS — I83018 Varicose veins of right lower extremity with ulcer other part of lower leg: Secondary | ICD-10-CM | POA: Diagnosis not present

## 2022-02-15 DIAGNOSIS — L97812 Non-pressure chronic ulcer of other part of right lower leg with fat layer exposed: Secondary | ICD-10-CM | POA: Diagnosis not present

## 2022-02-15 DIAGNOSIS — I5033 Acute on chronic diastolic (congestive) heart failure: Secondary | ICD-10-CM | POA: Diagnosis not present

## 2022-02-15 DIAGNOSIS — I872 Venous insufficiency (chronic) (peripheral): Secondary | ICD-10-CM | POA: Diagnosis not present

## 2022-02-15 DIAGNOSIS — I110 Hypertensive heart disease with heart failure: Secondary | ICD-10-CM | POA: Diagnosis not present

## 2022-02-15 DIAGNOSIS — L97821 Non-pressure chronic ulcer of other part of left lower leg limited to breakdown of skin: Secondary | ICD-10-CM | POA: Diagnosis not present

## 2022-02-16 DIAGNOSIS — Z8673 Personal history of transient ischemic attack (TIA), and cerebral infarction without residual deficits: Secondary | ICD-10-CM | POA: Diagnosis not present

## 2022-02-16 DIAGNOSIS — I10 Essential (primary) hypertension: Secondary | ICD-10-CM | POA: Diagnosis not present

## 2022-02-16 DIAGNOSIS — I110 Hypertensive heart disease with heart failure: Secondary | ICD-10-CM | POA: Diagnosis not present

## 2022-02-16 DIAGNOSIS — L97812 Non-pressure chronic ulcer of other part of right lower leg with fat layer exposed: Secondary | ICD-10-CM | POA: Diagnosis not present

## 2022-02-16 DIAGNOSIS — I83018 Varicose veins of right lower extremity with ulcer other part of lower leg: Secondary | ICD-10-CM | POA: Diagnosis not present

## 2022-02-16 DIAGNOSIS — Z7409 Other reduced mobility: Secondary | ICD-10-CM | POA: Diagnosis not present

## 2022-02-16 DIAGNOSIS — I5033 Acute on chronic diastolic (congestive) heart failure: Secondary | ICD-10-CM | POA: Diagnosis not present

## 2022-02-16 DIAGNOSIS — T879 Unspecified complications of amputation stump: Secondary | ICD-10-CM | POA: Diagnosis not present

## 2022-02-16 DIAGNOSIS — E1122 Type 2 diabetes mellitus with diabetic chronic kidney disease: Secondary | ICD-10-CM | POA: Diagnosis not present

## 2022-02-16 DIAGNOSIS — Z89519 Acquired absence of unspecified leg below knee: Secondary | ICD-10-CM | POA: Diagnosis not present

## 2022-02-16 DIAGNOSIS — I872 Venous insufficiency (chronic) (peripheral): Secondary | ICD-10-CM | POA: Diagnosis not present

## 2022-02-16 DIAGNOSIS — L97821 Non-pressure chronic ulcer of other part of left lower leg limited to breakdown of skin: Secondary | ICD-10-CM | POA: Diagnosis not present

## 2022-02-17 DIAGNOSIS — E1140 Type 2 diabetes mellitus with diabetic neuropathy, unspecified: Secondary | ICD-10-CM | POA: Diagnosis not present

## 2022-02-17 DIAGNOSIS — Z9181 History of falling: Secondary | ICD-10-CM | POA: Diagnosis not present

## 2022-02-17 DIAGNOSIS — Z7902 Long term (current) use of antithrombotics/antiplatelets: Secondary | ICD-10-CM | POA: Diagnosis not present

## 2022-02-17 DIAGNOSIS — I5020 Unspecified systolic (congestive) heart failure: Secondary | ICD-10-CM | POA: Diagnosis not present

## 2022-02-17 DIAGNOSIS — N5089 Other specified disorders of the male genital organs: Secondary | ICD-10-CM | POA: Diagnosis not present

## 2022-02-17 DIAGNOSIS — S81801A Unspecified open wound, right lower leg, initial encounter: Secondary | ICD-10-CM | POA: Diagnosis not present

## 2022-02-17 DIAGNOSIS — Z8673 Personal history of transient ischemic attack (TIA), and cerebral infarction without residual deficits: Secondary | ICD-10-CM | POA: Diagnosis not present

## 2022-02-17 DIAGNOSIS — Z89512 Acquired absence of left leg below knee: Secondary | ICD-10-CM | POA: Diagnosis not present

## 2022-02-17 DIAGNOSIS — I2510 Atherosclerotic heart disease of native coronary artery without angina pectoris: Secondary | ICD-10-CM | POA: Diagnosis not present

## 2022-02-17 DIAGNOSIS — I509 Heart failure, unspecified: Secondary | ICD-10-CM | POA: Diagnosis not present

## 2022-02-17 DIAGNOSIS — R079 Chest pain, unspecified: Secondary | ICD-10-CM | POA: Diagnosis not present

## 2022-02-17 DIAGNOSIS — M109 Gout, unspecified: Secondary | ICD-10-CM | POA: Diagnosis not present

## 2022-02-17 DIAGNOSIS — Z794 Long term (current) use of insulin: Secondary | ICD-10-CM | POA: Diagnosis not present

## 2022-02-17 DIAGNOSIS — I10 Essential (primary) hypertension: Secondary | ICD-10-CM | POA: Diagnosis not present

## 2022-02-17 DIAGNOSIS — Z79899 Other long term (current) drug therapy: Secondary | ICD-10-CM | POA: Diagnosis not present

## 2022-02-17 DIAGNOSIS — E7800 Pure hypercholesterolemia, unspecified: Secondary | ICD-10-CM | POA: Diagnosis not present

## 2022-02-17 DIAGNOSIS — I110 Hypertensive heart disease with heart failure: Secondary | ICD-10-CM | POA: Diagnosis not present

## 2022-02-17 DIAGNOSIS — R0789 Other chest pain: Secondary | ICD-10-CM | POA: Diagnosis not present

## 2022-02-18 DIAGNOSIS — R079 Chest pain, unspecified: Secondary | ICD-10-CM | POA: Diagnosis not present

## 2022-02-18 DIAGNOSIS — I5023 Acute on chronic systolic (congestive) heart failure: Secondary | ICD-10-CM | POA: Diagnosis not present

## 2022-02-18 DIAGNOSIS — J9811 Atelectasis: Secondary | ICD-10-CM | POA: Diagnosis not present

## 2022-02-19 DIAGNOSIS — I5023 Acute on chronic systolic (congestive) heart failure: Secondary | ICD-10-CM | POA: Diagnosis not present

## 2022-02-27 ENCOUNTER — Encounter: Payer: Self-pay | Admitting: Orthopedic Surgery

## 2022-02-27 ENCOUNTER — Ambulatory Visit (INDEPENDENT_AMBULATORY_CARE_PROVIDER_SITE_OTHER): Payer: Medicare Other | Admitting: Orthopedic Surgery

## 2022-02-27 DIAGNOSIS — I5032 Chronic diastolic (congestive) heart failure: Secondary | ICD-10-CM | POA: Diagnosis not present

## 2022-02-27 DIAGNOSIS — E7801 Familial hypercholesterolemia: Secondary | ICD-10-CM | POA: Diagnosis not present

## 2022-02-27 DIAGNOSIS — Z89512 Acquired absence of left leg below knee: Secondary | ICD-10-CM

## 2022-02-27 DIAGNOSIS — E1129 Type 2 diabetes mellitus with other diabetic kidney complication: Secondary | ICD-10-CM | POA: Diagnosis not present

## 2022-02-27 DIAGNOSIS — I10 Essential (primary) hypertension: Secondary | ICD-10-CM | POA: Diagnosis not present

## 2022-02-27 DIAGNOSIS — E7800 Pure hypercholesterolemia, unspecified: Secondary | ICD-10-CM | POA: Diagnosis not present

## 2022-02-27 NOTE — Progress Notes (Signed)
Office Visit Note   Patient: [name] [name]           Date of Birth: 1956/02/13           MRN: [id number] Visit Date: 02/27/2022              Requested by: Sasser, Silvestre Moment, MD [place],  [place] [id number] PCP: Manon Hilding, MD  Chief Complaint  Patient presents with   Left Leg - Routine Post Op    10/21/2021 left BKA       HPI: Patient is 4 months status post left transtibial amputation.  Patient still has drainage dermatitis swelling.  Patient states he was admitted recently for congestive heart failure and was on 80 mg of Lasix a day.  He currently is using Xeroform and Kerlix and an Ace wrap.  His right lower extremity is wrapped with home health nursing.  Assessment & Plan: Visit Diagnoses:  1. Left below-knee amputee [facility])     Plan: Recommend patient use the under liner shrinker change this 2-3 times a day.  Follow-up with [employer] for casting for his prosthesis.  Follow-Up Instructions: Return in about 4 weeks (around 03/27/2022).   Ortho Exam  Patient is alert, oriented, no adenopathy, well-dressed, normal affect, normal respiratory effort. Examination patient's recent congestive heart failure is caused increased swelling edema drainage and maceration and dermatitis with a petechial rash on the left transtibial amputation.  Patient was given a gray under liner shrinker to be worn daily and change 2-3 times a day.  Imaging: No results found. No images are attached to the encounter.  Labs: Lab Results  Component Value Date   HGBA1C 7.3 (H) 10/17/2021   HGBA1C 6.5 (H) 09/12/2019   CRP 17.6 (H) 09/14/2019   CRP 14.7 (H) 09/13/2019   CRP 14.6 (H) 09/12/2019   REPTSTATUS 11/20/2021 FINAL 11/17/2021   CULT (A) 11/17/2021    >=100,000 COLONIES/mL ESCHERICHIA COLI >=100,000 COLONIES/mL KLEBSIELLA OXYTOCA    LABORGA ESCHERICHIA COLI (A) 11/17/2021   LABORGA KLEBSIELLA OXYTOCA (A) 11/17/2021     Lab Results  Component Value Date   ALBUMIN 2.6 (L)  11/10/2021   ALBUMIN 1.8 (L) 10/25/2021   ALBUMIN 2.5 (L) 10/14/2021    Lab Results  Component Value Date   MG 2.1 09/14/2019   MG 2.3 09/13/2019   MG 1.9 09/12/2019   No results found for: "VD25OH"  No results found for: "PREALBUMIN"    Latest Ref Rng & Units 12/21/2021    9:53 AM 11/10/2021    6:54 AM 10/31/2021    5:00 AM  CBC EXTENDED  WBC 4.0 - 10.5 K/uL 7.6  6.5  7.9   RBC 4.22 - 5.81 Mil/uL 4.57  3.88  3.22   Hemoglobin 13.0 - 17.0 g/dL 11.4  9.5  7.8   HCT 39.0 - 52.0 % 35.6  33.0  26.5   Platelets 150.0 - 400.0 K/uL 230.0  176  243   NEUT# 1.4 - 7.7 K/uL 5.3  4.3    Lymph# 0.7 - 4.0 K/uL 0.9  0.8       There is no height or weight on file to calculate BMI.  Orders:  No orders of the defined types were placed in this encounter.  No orders of the defined types were placed in this encounter.    Procedures: No procedures performed  Clinical Data: No additional findings.  ROS:  All other systems negative, except as noted in the HPI. Review  of Systems  Objective: Vital Signs: There were no vitals taken for this visit.  Specialty Comments:  No specialty comments available.  PMFS History: Patient Active Problem List   Diagnosis Date Noted   Acute on chronic diastolic (congestive) heart failure (HCC)    Controlled type 2 diabetes mellitus with hyperglycemia, without long-term current use of insulin (HCC)    Phantom limb pain (HCC)    Left below-knee amputee ([place]) 10/25/2021   Left leg pain    Osteomyelitis of ankle and foot ([place]) 10/15/2021   Anemia 10/15/2021   Pressure ulcer 10/15/2021   History of CVA (cerebrovascular accident) 10/15/2021   Septic arthritis of ankle and foot region ([place]) 10/15/2021   AKI (acute kidney injury) ([place]) 10/14/2021   Pneumonia due to COVID-19 virus 09/09/2019   Hypokalemia 09/09/2019   Acute respiratory failure with hypoxia ([place]) 09/09/2019   Essential hypertension 10/14/2014   Chronic diastolic heart failure ([place])  10/14/2014   Morbid obesity ([place]) 07/22/2013   Varicose veins of lower extremities with other complications 08/65/7846   OSA (obstructive sleep apnea) 07/22/2013   Venous stasis ulcers ([place]) 07/22/2013   Diabetes mellitus ([place]) 01/03/2011   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[place])    Diverticulosis    Essential hypertension    Gastritis    History of nephrolithiasis    History of open leg wound    History of stroke 03/19/2003   Obstructive sleep apnea    Peptic ulcer    TIA (transient ischemic attack)    Varicose veins    Laser ablation of left great saphenous vein 2009    Family History  Problem Relation Age of Onset   Bone cancer Mother    Hypertension Father    Pancreatic cancer Maternal Grandmother    Stroke Paternal Grandmother    Diabetes Paternal Grandfather    Heart attack Brother    Hypertension Brother    Heart attack Sister     Past Surgical History:  Procedure Laterality Date   AMPUTATION Left 10/21/2021   Procedure: LEFT BELOW KNEE AMPUTATION;  Surgeon: [name], MD;  Location: [place];  Service: Orthopedics;  Laterality: Left;   BIOPSY  10/19/2021   Procedure: BIOPSY;  Surgeon: Sharyn Creamer, MD;  Location: [place];  Service: Gastroenterology;;   COLONOSCOPY N/A 10/19/2021   Procedure: COLONOSCOPY;  Surgeon: Sharyn Creamer, MD;  Location: [place];  Service: Gastroenterology;  Laterality: N/A;   CYSTOSCOPY WITH RETROGRADE PYELOGRAM, URETEROSCOPY AND STENT PLACEMENT Right 08/02/2015   Procedure: CYSTOSCOPY WITH RETROGRADE PYELOGRAM, URETEROSCOPY , STONE EXTRACTION AND STENT PLACEMENT;  Surgeon: Cleon Gustin, MD;  [facility];  Service: Urology;  Laterality: Right;   ESOPHAGOGASTRODUODENOSCOPY (EGD) WITH PROPOFOL N/A 10/19/2021   Procedure: ESOPHAGOGASTRODUODENOSCOPY (EGD) WITH PROPOFOL;  Surgeon: Sharyn Creamer, MD;  Location: [place];  Service: Gastroenterology;  Laterality: N/A;   HOLMIUM  LASER APPLICATION Right 96/29/5284   Procedure: HOLMIUM LASER APPLICATION;  Surgeon: Cleon Gustin, MD;  [facility];  Service: Urology;  Laterality: Right;   KIDNEY STONE SURGERY     POLYPECTOMY  10/19/2021   Procedure: POLYPECTOMY;  Surgeon: Sharyn Creamer, MD;  [facility];  Service: Gastroenterology;;   [place] Left 06/2010   Laser ablation   Social History   Occupational History   Occupation: controll room operator     Employer: [employer]  Tobacco Use  Smoking status: Never   Smokeless tobacco: Never  Substance and Sexual Activity   Alcohol use: No    Alcohol/week: 0.0 standard drinks of alcohol   Drug use: No   Sexual activity: Not on file

## 2022-03-10 ENCOUNTER — Ambulatory Visit: Payer: Medicare Other | Admitting: Nurse Practitioner

## 2022-03-15 ENCOUNTER — Telehealth: Payer: Self-pay | Admitting: Cardiology

## 2022-03-15 NOTE — Telephone Encounter (Signed)
Pt states that he received al call, yesterday but doesn't know who it was or why they called. Please advise

## 2022-03-15 NOTE — Telephone Encounter (Signed)
Not contacted by clinical staff Will forward to schedulers

## 2022-03-17 DIAGNOSIS — E119 Type 2 diabetes mellitus without complications: Secondary | ICD-10-CM | POA: Diagnosis not present

## 2022-03-17 DIAGNOSIS — E669 Obesity, unspecified: Secondary | ICD-10-CM | POA: Diagnosis not present

## 2022-03-17 DIAGNOSIS — Z89512 Acquired absence of left leg below knee: Secondary | ICD-10-CM | POA: Diagnosis not present

## 2022-03-17 DIAGNOSIS — I5030 Unspecified diastolic (congestive) heart failure: Secondary | ICD-10-CM | POA: Diagnosis not present

## 2022-03-17 DIAGNOSIS — L97812 Non-pressure chronic ulcer of other part of right lower leg with fat layer exposed: Secondary | ICD-10-CM | POA: Diagnosis not present

## 2022-03-17 DIAGNOSIS — I110 Hypertensive heart disease with heart failure: Secondary | ICD-10-CM | POA: Diagnosis not present

## 2022-03-17 DIAGNOSIS — D649 Anemia, unspecified: Secondary | ICD-10-CM | POA: Diagnosis not present

## 2022-03-17 DIAGNOSIS — I872 Venous insufficiency (chronic) (peripheral): Secondary | ICD-10-CM | POA: Diagnosis not present

## 2022-03-17 DIAGNOSIS — Z794 Long term (current) use of insulin: Secondary | ICD-10-CM | POA: Diagnosis not present

## 2022-03-17 DIAGNOSIS — N200 Calculus of kidney: Secondary | ICD-10-CM | POA: Diagnosis not present

## 2022-03-17 DIAGNOSIS — E7800 Pure hypercholesterolemia, unspecified: Secondary | ICD-10-CM | POA: Diagnosis not present

## 2022-03-17 DIAGNOSIS — Z8673 Personal history of transient ischemic attack (TIA), and cerebral infarction without residual deficits: Secondary | ICD-10-CM | POA: Diagnosis not present

## 2022-03-17 DIAGNOSIS — R600 Localized edema: Secondary | ICD-10-CM | POA: Diagnosis not present

## 2022-03-21 DIAGNOSIS — R609 Edema, unspecified: Secondary | ICD-10-CM | POA: Diagnosis not present

## 2022-03-21 DIAGNOSIS — R0600 Dyspnea, unspecified: Secondary | ICD-10-CM | POA: Diagnosis not present

## 2022-03-21 DIAGNOSIS — Z89519 Acquired absence of unspecified leg below knee: Secondary | ICD-10-CM | POA: Diagnosis not present

## 2022-03-21 DIAGNOSIS — I5030 Unspecified diastolic (congestive) heart failure: Secondary | ICD-10-CM | POA: Diagnosis not present

## 2022-03-21 DIAGNOSIS — I509 Heart failure, unspecified: Secondary | ICD-10-CM | POA: Diagnosis not present

## 2022-03-21 DIAGNOSIS — D649 Anemia, unspecified: Secondary | ICD-10-CM | POA: Diagnosis not present

## 2022-03-21 DIAGNOSIS — Z8673 Personal history of transient ischemic attack (TIA), and cerebral infarction without residual deficits: Secondary | ICD-10-CM | POA: Diagnosis not present

## 2022-03-22 DIAGNOSIS — E119 Type 2 diabetes mellitus without complications: Secondary | ICD-10-CM | POA: Diagnosis not present

## 2022-03-22 DIAGNOSIS — I5030 Unspecified diastolic (congestive) heart failure: Secondary | ICD-10-CM | POA: Diagnosis not present

## 2022-03-22 DIAGNOSIS — I872 Venous insufficiency (chronic) (peripheral): Secondary | ICD-10-CM | POA: Diagnosis not present

## 2022-03-22 DIAGNOSIS — R600 Localized edema: Secondary | ICD-10-CM | POA: Diagnosis not present

## 2022-03-22 DIAGNOSIS — I110 Hypertensive heart disease with heart failure: Secondary | ICD-10-CM | POA: Diagnosis not present

## 2022-03-22 DIAGNOSIS — L97812 Non-pressure chronic ulcer of other part of right lower leg with fat layer exposed: Secondary | ICD-10-CM | POA: Diagnosis not present

## 2022-03-24 DIAGNOSIS — E119 Type 2 diabetes mellitus without complications: Secondary | ICD-10-CM | POA: Diagnosis not present

## 2022-03-24 DIAGNOSIS — I5030 Unspecified diastolic (congestive) heart failure: Secondary | ICD-10-CM | POA: Diagnosis not present

## 2022-03-24 DIAGNOSIS — L97812 Non-pressure chronic ulcer of other part of right lower leg with fat layer exposed: Secondary | ICD-10-CM | POA: Diagnosis not present

## 2022-03-24 DIAGNOSIS — I872 Venous insufficiency (chronic) (peripheral): Secondary | ICD-10-CM | POA: Diagnosis not present

## 2022-03-24 DIAGNOSIS — I110 Hypertensive heart disease with heart failure: Secondary | ICD-10-CM | POA: Diagnosis not present

## 2022-03-24 DIAGNOSIS — R600 Localized edema: Secondary | ICD-10-CM | POA: Diagnosis not present

## 2022-03-28 DIAGNOSIS — I110 Hypertensive heart disease with heart failure: Secondary | ICD-10-CM | POA: Diagnosis not present

## 2022-03-28 DIAGNOSIS — I872 Venous insufficiency (chronic) (peripheral): Secondary | ICD-10-CM | POA: Diagnosis not present

## 2022-03-28 DIAGNOSIS — E119 Type 2 diabetes mellitus without complications: Secondary | ICD-10-CM | POA: Diagnosis not present

## 2022-03-28 DIAGNOSIS — I5030 Unspecified diastolic (congestive) heart failure: Secondary | ICD-10-CM | POA: Diagnosis not present

## 2022-03-28 DIAGNOSIS — L97812 Non-pressure chronic ulcer of other part of right lower leg with fat layer exposed: Secondary | ICD-10-CM | POA: Diagnosis not present

## 2022-03-28 DIAGNOSIS — R600 Localized edema: Secondary | ICD-10-CM | POA: Diagnosis not present

## 2022-03-29 DIAGNOSIS — L97812 Non-pressure chronic ulcer of other part of right lower leg with fat layer exposed: Secondary | ICD-10-CM | POA: Diagnosis not present

## 2022-03-29 DIAGNOSIS — E119 Type 2 diabetes mellitus without complications: Secondary | ICD-10-CM | POA: Diagnosis not present

## 2022-03-29 DIAGNOSIS — I110 Hypertensive heart disease with heart failure: Secondary | ICD-10-CM | POA: Diagnosis not present

## 2022-03-29 DIAGNOSIS — R600 Localized edema: Secondary | ICD-10-CM | POA: Diagnosis not present

## 2022-03-29 DIAGNOSIS — I5030 Unspecified diastolic (congestive) heart failure: Secondary | ICD-10-CM | POA: Diagnosis not present

## 2022-03-29 DIAGNOSIS — I872 Venous insufficiency (chronic) (peripheral): Secondary | ICD-10-CM | POA: Diagnosis not present

## 2022-03-31 DIAGNOSIS — I872 Venous insufficiency (chronic) (peripheral): Secondary | ICD-10-CM | POA: Diagnosis not present

## 2022-03-31 DIAGNOSIS — E119 Type 2 diabetes mellitus without complications: Secondary | ICD-10-CM | POA: Diagnosis not present

## 2022-03-31 DIAGNOSIS — L97812 Non-pressure chronic ulcer of other part of right lower leg with fat layer exposed: Secondary | ICD-10-CM | POA: Diagnosis not present

## 2022-03-31 DIAGNOSIS — I5030 Unspecified diastolic (congestive) heart failure: Secondary | ICD-10-CM | POA: Diagnosis not present

## 2022-03-31 DIAGNOSIS — I110 Hypertensive heart disease with heart failure: Secondary | ICD-10-CM | POA: Diagnosis not present

## 2022-03-31 DIAGNOSIS — R600 Localized edema: Secondary | ICD-10-CM | POA: Diagnosis not present

## 2022-04-03 ENCOUNTER — Ambulatory Visit: Payer: Medicare Other | Admitting: Orthopedic Surgery

## 2022-04-04 DIAGNOSIS — R600 Localized edema: Secondary | ICD-10-CM | POA: Diagnosis not present

## 2022-04-04 DIAGNOSIS — E119 Type 2 diabetes mellitus without complications: Secondary | ICD-10-CM | POA: Diagnosis not present

## 2022-04-04 DIAGNOSIS — I5030 Unspecified diastolic (congestive) heart failure: Secondary | ICD-10-CM | POA: Diagnosis not present

## 2022-04-04 DIAGNOSIS — L97812 Non-pressure chronic ulcer of other part of right lower leg with fat layer exposed: Secondary | ICD-10-CM | POA: Diagnosis not present

## 2022-04-04 DIAGNOSIS — I110 Hypertensive heart disease with heart failure: Secondary | ICD-10-CM | POA: Diagnosis not present

## 2022-04-04 DIAGNOSIS — I872 Venous insufficiency (chronic) (peripheral): Secondary | ICD-10-CM | POA: Diagnosis not present

## 2022-04-05 DIAGNOSIS — L97812 Non-pressure chronic ulcer of other part of right lower leg with fat layer exposed: Secondary | ICD-10-CM | POA: Diagnosis not present

## 2022-04-05 DIAGNOSIS — R600 Localized edema: Secondary | ICD-10-CM | POA: Diagnosis not present

## 2022-04-05 DIAGNOSIS — I5030 Unspecified diastolic (congestive) heart failure: Secondary | ICD-10-CM | POA: Diagnosis not present

## 2022-04-05 DIAGNOSIS — I872 Venous insufficiency (chronic) (peripheral): Secondary | ICD-10-CM | POA: Diagnosis not present

## 2022-04-05 DIAGNOSIS — I110 Hypertensive heart disease with heart failure: Secondary | ICD-10-CM | POA: Diagnosis not present

## 2022-04-05 DIAGNOSIS — E119 Type 2 diabetes mellitus without complications: Secondary | ICD-10-CM | POA: Diagnosis not present

## 2022-04-11 ENCOUNTER — Ambulatory Visit (INDEPENDENT_AMBULATORY_CARE_PROVIDER_SITE_OTHER): Payer: Medicare Other | Admitting: Family

## 2022-04-11 DIAGNOSIS — Z89512 Acquired absence of left leg below knee: Secondary | ICD-10-CM | POA: Diagnosis not present

## 2022-04-11 DIAGNOSIS — E119 Type 2 diabetes mellitus without complications: Secondary | ICD-10-CM | POA: Diagnosis not present

## 2022-04-11 DIAGNOSIS — R600 Localized edema: Secondary | ICD-10-CM | POA: Diagnosis not present

## 2022-04-11 DIAGNOSIS — L97812 Non-pressure chronic ulcer of other part of right lower leg with fat layer exposed: Secondary | ICD-10-CM | POA: Diagnosis not present

## 2022-04-11 DIAGNOSIS — I5030 Unspecified diastolic (congestive) heart failure: Secondary | ICD-10-CM | POA: Diagnosis not present

## 2022-04-11 DIAGNOSIS — I110 Hypertensive heart disease with heart failure: Secondary | ICD-10-CM | POA: Diagnosis not present

## 2022-04-11 DIAGNOSIS — I872 Venous insufficiency (chronic) (peripheral): Secondary | ICD-10-CM | POA: Diagnosis not present

## 2022-04-12 DIAGNOSIS — E119 Type 2 diabetes mellitus without complications: Secondary | ICD-10-CM | POA: Diagnosis not present

## 2022-04-12 DIAGNOSIS — L97812 Non-pressure chronic ulcer of other part of right lower leg with fat layer exposed: Secondary | ICD-10-CM | POA: Diagnosis not present

## 2022-04-12 DIAGNOSIS — I110 Hypertensive heart disease with heart failure: Secondary | ICD-10-CM | POA: Diagnosis not present

## 2022-04-12 DIAGNOSIS — R600 Localized edema: Secondary | ICD-10-CM | POA: Diagnosis not present

## 2022-04-12 DIAGNOSIS — I872 Venous insufficiency (chronic) (peripheral): Secondary | ICD-10-CM | POA: Diagnosis not present

## 2022-04-12 DIAGNOSIS — I5030 Unspecified diastolic (congestive) heart failure: Secondary | ICD-10-CM | POA: Diagnosis not present

## 2022-04-14 DIAGNOSIS — E119 Type 2 diabetes mellitus without complications: Secondary | ICD-10-CM | POA: Diagnosis not present

## 2022-04-14 DIAGNOSIS — L97812 Non-pressure chronic ulcer of other part of right lower leg with fat layer exposed: Secondary | ICD-10-CM | POA: Diagnosis not present

## 2022-04-14 DIAGNOSIS — I110 Hypertensive heart disease with heart failure: Secondary | ICD-10-CM | POA: Diagnosis not present

## 2022-04-14 DIAGNOSIS — I872 Venous insufficiency (chronic) (peripheral): Secondary | ICD-10-CM | POA: Diagnosis not present

## 2022-04-14 DIAGNOSIS — I5030 Unspecified diastolic (congestive) heart failure: Secondary | ICD-10-CM | POA: Diagnosis not present

## 2022-04-14 DIAGNOSIS — R600 Localized edema: Secondary | ICD-10-CM | POA: Diagnosis not present

## 2022-04-16 DIAGNOSIS — Z89512 Acquired absence of left leg below knee: Secondary | ICD-10-CM | POA: Diagnosis not present

## 2022-04-16 DIAGNOSIS — D649 Anemia, unspecified: Secondary | ICD-10-CM | POA: Diagnosis not present

## 2022-04-16 DIAGNOSIS — I5030 Unspecified diastolic (congestive) heart failure: Secondary | ICD-10-CM | POA: Diagnosis not present

## 2022-04-16 DIAGNOSIS — E7800 Pure hypercholesterolemia, unspecified: Secondary | ICD-10-CM | POA: Diagnosis not present

## 2022-04-16 DIAGNOSIS — L97812 Non-pressure chronic ulcer of other part of right lower leg with fat layer exposed: Secondary | ICD-10-CM | POA: Diagnosis not present

## 2022-04-16 DIAGNOSIS — Z794 Long term (current) use of insulin: Secondary | ICD-10-CM | POA: Diagnosis not present

## 2022-04-16 DIAGNOSIS — E119 Type 2 diabetes mellitus without complications: Secondary | ICD-10-CM | POA: Diagnosis not present

## 2022-04-16 DIAGNOSIS — R600 Localized edema: Secondary | ICD-10-CM | POA: Diagnosis not present

## 2022-04-16 DIAGNOSIS — I872 Venous insufficiency (chronic) (peripheral): Secondary | ICD-10-CM | POA: Diagnosis not present

## 2022-04-16 DIAGNOSIS — N200 Calculus of kidney: Secondary | ICD-10-CM | POA: Diagnosis not present

## 2022-04-16 DIAGNOSIS — E669 Obesity, unspecified: Secondary | ICD-10-CM | POA: Diagnosis not present

## 2022-04-16 DIAGNOSIS — Z8673 Personal history of transient ischemic attack (TIA), and cerebral infarction without residual deficits: Secondary | ICD-10-CM | POA: Diagnosis not present

## 2022-04-16 DIAGNOSIS — I110 Hypertensive heart disease with heart failure: Secondary | ICD-10-CM | POA: Diagnosis not present

## 2022-04-17 DIAGNOSIS — R600 Localized edema: Secondary | ICD-10-CM | POA: Diagnosis not present

## 2022-04-17 DIAGNOSIS — I872 Venous insufficiency (chronic) (peripheral): Secondary | ICD-10-CM | POA: Diagnosis not present

## 2022-04-17 DIAGNOSIS — I110 Hypertensive heart disease with heart failure: Secondary | ICD-10-CM | POA: Diagnosis not present

## 2022-04-17 DIAGNOSIS — L97812 Non-pressure chronic ulcer of other part of right lower leg with fat layer exposed: Secondary | ICD-10-CM | POA: Diagnosis not present

## 2022-04-17 DIAGNOSIS — I5030 Unspecified diastolic (congestive) heart failure: Secondary | ICD-10-CM | POA: Diagnosis not present

## 2022-04-17 DIAGNOSIS — E119 Type 2 diabetes mellitus without complications: Secondary | ICD-10-CM | POA: Diagnosis not present

## 2022-04-18 DIAGNOSIS — D649 Anemia, unspecified: Secondary | ICD-10-CM | POA: Diagnosis not present

## 2022-04-18 DIAGNOSIS — E119 Type 2 diabetes mellitus without complications: Secondary | ICD-10-CM | POA: Diagnosis not present

## 2022-04-18 DIAGNOSIS — R600 Localized edema: Secondary | ICD-10-CM | POA: Diagnosis not present

## 2022-04-18 DIAGNOSIS — I110 Hypertensive heart disease with heart failure: Secondary | ICD-10-CM | POA: Diagnosis not present

## 2022-04-18 DIAGNOSIS — E7800 Pure hypercholesterolemia, unspecified: Secondary | ICD-10-CM | POA: Diagnosis not present

## 2022-04-18 DIAGNOSIS — I872 Venous insufficiency (chronic) (peripheral): Secondary | ICD-10-CM | POA: Diagnosis not present

## 2022-04-18 DIAGNOSIS — I5030 Unspecified diastolic (congestive) heart failure: Secondary | ICD-10-CM | POA: Diagnosis not present

## 2022-04-18 DIAGNOSIS — L97812 Non-pressure chronic ulcer of other part of right lower leg with fat layer exposed: Secondary | ICD-10-CM | POA: Diagnosis not present

## 2022-04-21 DIAGNOSIS — E119 Type 2 diabetes mellitus without complications: Secondary | ICD-10-CM | POA: Diagnosis not present

## 2022-04-21 DIAGNOSIS — I872 Venous insufficiency (chronic) (peripheral): Secondary | ICD-10-CM | POA: Diagnosis not present

## 2022-04-21 DIAGNOSIS — R600 Localized edema: Secondary | ICD-10-CM | POA: Diagnosis not present

## 2022-04-21 DIAGNOSIS — I110 Hypertensive heart disease with heart failure: Secondary | ICD-10-CM | POA: Diagnosis not present

## 2022-04-21 DIAGNOSIS — L97812 Non-pressure chronic ulcer of other part of right lower leg with fat layer exposed: Secondary | ICD-10-CM | POA: Diagnosis not present

## 2022-04-21 DIAGNOSIS — I5030 Unspecified diastolic (congestive) heart failure: Secondary | ICD-10-CM | POA: Diagnosis not present

## 2022-04-24 ENCOUNTER — Encounter: Payer: Self-pay | Admitting: Cardiology

## 2022-04-24 ENCOUNTER — Ambulatory Visit (INDEPENDENT_AMBULATORY_CARE_PROVIDER_SITE_OTHER): Payer: Medicare Other | Admitting: Cardiology

## 2022-04-24 VITALS — BP 124/84 | HR 56 | Ht 70.5 in | Wt 310.0 lb

## 2022-04-24 DIAGNOSIS — Z8673 Personal history of transient ischemic attack (TIA), and cerebral infarction without residual deficits: Secondary | ICD-10-CM | POA: Diagnosis not present

## 2022-04-24 DIAGNOSIS — I890 Lymphedema, not elsewhere classified: Secondary | ICD-10-CM | POA: Diagnosis not present

## 2022-04-24 DIAGNOSIS — I10 Essential (primary) hypertension: Secondary | ICD-10-CM

## 2022-04-24 DIAGNOSIS — I509 Heart failure, unspecified: Secondary | ICD-10-CM | POA: Diagnosis not present

## 2022-04-24 DIAGNOSIS — I5032 Chronic diastolic (congestive) heart failure: Secondary | ICD-10-CM

## 2022-04-24 DIAGNOSIS — Z79899 Other long term (current) drug therapy: Secondary | ICD-10-CM | POA: Diagnosis not present

## 2022-04-24 NOTE — Progress Notes (Signed)
Cardiology Office Note  Date: 04/24/2022   ID: Jaidin, Richison [birth year], MRN [id number]  PCP:  Manon Hilding, MD  Cardiologist:  Rozann Lesches, MD Electrophysiologist:  None   Chief Complaint  Patient presents with   Establish cardiology follow-up    History of Present Illness: [name] [name] is a 66 y.o. male referred back to the office by Dr. Quintin Alto to reestablish cardiology follow-up.  He was last seen in 2019.  He is here today with his sister.  Currently living in [place], has a fianc there with plan to get married in November.  He was hospitalized back in June in [place] with fluid overload and presumably acute on chronic HFpEF.  He was treated with IV Lasix and discharged home, no echocardiogram obtained at that facility.  He states that he had not been using Lasix with any regularity prior to that, takes it when he needs it and is not able to weigh himself at this time following left BKA earlier in the year.  He is in a wheelchair today.  Current Lasix dose is 40 mg once or twice daily as needed.  He tells me that he takes at least one dose on days when he is at home and does not have to go out.  He is currently not on a potassium supplement.  Last echocardiogram was in 2019 at which point LVEF was 60 to 65% with mild LVH, mildly sclerotic aortic valve, moderately dilated left atrium.  Chart review finds left BKA back in February of this year in the setting of osteomyelitis.  Also history of GI bleed around that time with single solitary ulcer noted at the ileocecal valve and nonbleeding internal hemorrhoids found at colonoscopy.  I personally reviewed his ECG today which shows sinus bradycardia with prolonged PR interval.  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[place])    Diverticulosis    Essential hypertension    Gastritis    History of nephrolithiasis    History of open leg wound    History of stroke  03/19/2003   Lymphedema    Obstructive sleep apnea    Peptic ulcer    TIA (transient ischemic attack)    Varicose veins    Laser ablation of left great saphenous vein 2009    Past Surgical History:  Procedure Laterality Date   AMPUTATION Left 10/21/2021   Procedure: LEFT BELOW KNEE AMPUTATION;  Surgeon: [name], MD;  Location: [place];  Service: Orthopedics;  Laterality: Left;   BIOPSY  10/19/2021   Procedure: BIOPSY;  Surgeon: Sharyn Creamer, MD;  Location: [place];  Service: Gastroenterology;;   COLONOSCOPY N/A 10/19/2021   Procedure: COLONOSCOPY;  Surgeon: Sharyn Creamer, MD;  Location: [place];  Service: Gastroenterology;  Laterality: N/A;   CYSTOSCOPY WITH RETROGRADE PYELOGRAM, URETEROSCOPY AND STENT PLACEMENT Right 08/02/2015   Procedure: CYSTOSCOPY WITH RETROGRADE PYELOGRAM, URETEROSCOPY , STONE EXTRACTION AND STENT PLACEMENT;  Surgeon: Cleon Gustin, MD;  [facility];  Service: Urology;  Laterality: Right;   ESOPHAGOGASTRODUODENOSCOPY (EGD) WITH PROPOFOL N/A 10/19/2021   Procedure: ESOPHAGOGASTRODUODENOSCOPY (EGD) WITH PROPOFOL;  Surgeon: Sharyn Creamer, MD;  Location: [place];  Service: Gastroenterology;  Laterality: N/A;   HOLMIUM LASER APPLICATION Right 52/77/8242   Procedure: HOLMIUM LASER APPLICATION;  Surgeon: Cleon Gustin, MD;  [facility];  Service: Urology;  Laterality: Right;   KIDNEY STONE SURGERY  POLYPECTOMY  10/19/2021   Procedure: POLYPECTOMY;  Surgeon: Sharyn Creamer, MD;  Location: [facility]OSCOPY;  Service: Gastroenterology;;   [place] Left 06/2010   Laser ablation    Current Outpatient Medications  Medication Sig Dispense Refill   acetaminophen (TYLENOL) 325 MG tablet Take 1-2 tablets (325-650 mg total) by mouth every 6 (six) hours as needed for mild pain (pain score 1-3 or temp > 100.5).     allopurinol (ZYLOPRIM) 300 MG tablet Take 300 mg by mouth daily.     Ascorbic Acid (VITAMIN C) 1000 MG tablet  Take 1,000 mg by mouth as needed.     aspirin EC 81 MG tablet Take 1 tablet (81 mg total) by mouth daily. 90 tablet 3   atorvastatin (LIPITOR) 10 MG tablet Take 10 mg by mouth daily.     clopidogrel (PLAVIX) 75 MG tablet Take 75 mg by mouth daily.     docusate sodium (COLACE) 100 MG capsule Take 1 capsule (100 mg total) by mouth 2 (two) times daily. 10 capsule 0   ferrous sulfate 325 (65 FE) MG tablet Take 1 tablet (325 mg total) by mouth daily with breakfast.  3   furosemide (LASIX) 40 MG tablet Take 40 mg by mouth 2 (two) times daily as needed.     gabapentin (NEURONTIN) 100 MG capsule Take 1 capsule (100 mg total) by mouth at bedtime.     hydrALAZINE (APRESOLINE) 50 MG tablet Take 50 mg by mouth 3 (three) times daily.     insulin glargine-yfgn (SEMGLEE) 100 UNIT/ML injection Inject 0.11 mLs (11 Units total) into the skin daily. 10 mL 11   metoprolol succinate (TOPROL-XL) 25 MG 24 hr tablet Take 1 tablet (25 mg total) by mouth daily. 30 tablet 0   No current facility-administered medications for this visit.   Allergies:  Patient has no known allergies.   Social History: The patient  reports that he has never smoked. He has never used smokeless tobacco. He reports that he does not drink alcohol and does not use drugs.   Family History: The patient's family history includes Bone cancer in his mother; Diabetes in his paternal grandfather; Heart attack in his brother and sister; Hypertension in his brother and father; Pancreatic cancer in his maternal grandmother; Stroke in his paternal grandmother.   ROS: No palpitations or syncope.  Physical Exam: VS:  BP 124/84   Pulse (!) 56   Ht 5' 10.5" (1.791 m)   Wt (!) 310 lb (140.6 kg)   SpO2 94%   BMI 43.85 kg/m , BMI Body mass index is 43.85 kg/m.  Wt Readings from Last 3 Encounters:  04/24/22 (!) 310 lb (140.6 kg)  11/17/21 (!) 326 lb 4.5 oz (148 kg)  10/15/21 (!) 351 lb (159.2 kg)    General: Patient in wheelchair. HEENT: Conjunctiva  and lids normal, oropharynx clear with poor dentition. Neck: Supple, no elevated JVP or carotid bruits, no thyromegaly. Lungs: Decreased breath sounds throughout without wheezing, nonlabored breathing at rest. Cardiac: Regular rate and rhythm, no S3, 1/6 systolic murmur. Abdomen: Protuberant, bowel sounds present, no guarding or rebound. Extremities: Chronic appearing lymphedema, right lower leg wrapped, left status post BKA. Skin: Warm and dry. Musculoskeletal: No kyphosis. Neuropsychiatric: Alert and oriented x3, affect grossly appropriate.  ECG:  An ECG dated 09/09/2019 was personally reviewed today and demonstrated:  Sinus rhythm with prolonged PR interval, PVC.  Recent Labwork: 10/20/2021: B Natriuretic Peptide 538.5 11/10/2021: ALT 16; AST 14  11/14/2021: BUN 20; Creatinine, Ser 1.21; Potassium 4.1; Sodium 140 12/21/2021: Hemoglobin 11.4; Platelets 230.0     Component Value Date/Time   TRIG 35 09/09/2019 0234  June 2023: Potassium 4.0, BUN 19, creatinine 1.2, AST 17, ALT 23, NT-proBNP 2162, hemoglobin 12.9, platelets 243  Other Studies Reviewed Today:  Echocardiogram 05/23/2018: - Left ventricle: The cavity size was normal. Wall thickness was    increased in a pattern of mild LVH. Systolic function was normal.    The estimated ejection fraction was in the range of 60% to 65%.    Diastolic function is abnormal, indeterminant grade. Wall motion    was normal; there were no regional wall motion abnormalities.  - Aortic valve: Mildly calcified annulus. Trileaflet; mildly    thickened leaflets. There was mild regurgitation. Valve area    (VTI): 4.71 cm^2. Valve area (Vmax): 3.73 cm^2. Valve area    (Vmean): 4.11 cm^2.  - Left atrium: The atrium was moderately dilated.  - Technically adequate study.   Assessment and Plan:  1.  History of HFpEF and chronic lymphedema with recurrent fluid overload.  Hospitalized back in June for IV diuresis and has not had a follow-up echocardiogram since  2019.  We will arrange a follow-up echocardiogram, for now I have asked him to use Lasix at least 40 mg daily, increasing dose for abdominal fullness or worsening edema, hopefully he will be able to weigh more consistently once he is able to use his left leg prosthesis.  We will obtain a BMET to see if he requires a potassium supplement as well.  2.  Essential hypertension, blood pressure control is adequate today.  He is on hydralazine and Toprol-XL.  3.  History of TIA, currently on Lipitor and Plavix.  Medication Adjustments/Labs and Tests Ordered: Current medicines are reviewed at length with the patient today.  Concerns regarding medicines are outlined above.   Tests Ordered: Orders Placed This Encounter  Procedures   Basic metabolic panel   EKG 14-HFWY   ECHOCARDIOGRAM COMPLETE    Medication Changes: No orders of the defined types were placed in this encounter.   Disposition:  Follow up  6 weeks.  Signed, Satira Sark, MD, [facility] 04/24/2022 3:32 PM    [facility] [place], [place], [place] [address] Phone: [phone]; Fax: [phone]

## 2022-04-24 NOTE — Patient Instructions (Addendum)
Medication Instructions:  Your physician recommends that you continue on your current medications as directed. Please refer to the Current Medication list given to you today.  Labwork: BMET today or tomorrow at [facility]  Testing/Procedures: Your physician has requested that you have an echocardiogram. Echocardiography is a painless test that uses sound waves to create images of your heart. It provides your doctor with information about the size and shape of your heart and how well your heart's chambers and valves are working. This procedure takes approximately one hour. There are no restrictions for this procedure.  Follow-Up: Your physician recommends that you schedule a follow-up appointment in: 6 weeks  Any Other Special Instructions Will Be Listed Below (If Applicable).  If you need a refill on your cardiac medications before your next appointment, please call your pharmacy.

## 2022-04-25 DIAGNOSIS — I872 Venous insufficiency (chronic) (peripheral): Secondary | ICD-10-CM | POA: Diagnosis not present

## 2022-04-25 DIAGNOSIS — E119 Type 2 diabetes mellitus without complications: Secondary | ICD-10-CM | POA: Diagnosis not present

## 2022-04-25 DIAGNOSIS — I110 Hypertensive heart disease with heart failure: Secondary | ICD-10-CM | POA: Diagnosis not present

## 2022-04-25 DIAGNOSIS — L97812 Non-pressure chronic ulcer of other part of right lower leg with fat layer exposed: Secondary | ICD-10-CM | POA: Diagnosis not present

## 2022-04-25 DIAGNOSIS — R600 Localized edema: Secondary | ICD-10-CM | POA: Diagnosis not present

## 2022-04-25 DIAGNOSIS — I5030 Unspecified diastolic (congestive) heart failure: Secondary | ICD-10-CM | POA: Diagnosis not present

## 2022-04-26 ENCOUNTER — Encounter: Payer: Self-pay | Admitting: Family

## 2022-04-26 DIAGNOSIS — I5030 Unspecified diastolic (congestive) heart failure: Secondary | ICD-10-CM | POA: Diagnosis not present

## 2022-04-26 DIAGNOSIS — R600 Localized edema: Secondary | ICD-10-CM | POA: Diagnosis not present

## 2022-04-26 DIAGNOSIS — I110 Hypertensive heart disease with heart failure: Secondary | ICD-10-CM | POA: Diagnosis not present

## 2022-04-26 DIAGNOSIS — E119 Type 2 diabetes mellitus without complications: Secondary | ICD-10-CM | POA: Diagnosis not present

## 2022-04-26 DIAGNOSIS — I872 Venous insufficiency (chronic) (peripheral): Secondary | ICD-10-CM | POA: Diagnosis not present

## 2022-04-26 DIAGNOSIS — L97812 Non-pressure chronic ulcer of other part of right lower leg with fat layer exposed: Secondary | ICD-10-CM | POA: Diagnosis not present

## 2022-04-26 NOTE — Progress Notes (Unsigned)
Post-Op Visit Note   Patient: [name] [name]           Date of Birth: 1956-05-15           MRN: [id number] Visit Date: 04/11/2022 PCP: Manon Hilding, MD  Chief Complaint:  Chief Complaint  Patient presents with   Left Leg - Follow-up    10/21/21 left BKA    HPI:  HPI The patient is a 66 year old gentleman seen status post left transtibial amputation he has been doing well but unfortunately had a stretch where his dressings were not changed for quite some time he has been getting some pressure on the lateral side of his residual limb and now has developed an ulcer  Is currently at home Ortho Exam  Visit Diagnoses: No diagnosis found.  Plan: follow up 2 weeks  Follow-Up Instructions: Return in about 4 weeks (around 05/09/2022).   Imaging: No results found.  Orders:  No orders of the defined types were placed in this encounter.  No orders of the defined types were placed in this encounter.    PMFS History: Patient Active Problem List   Diagnosis Date Noted   Acute on chronic diastolic (congestive) heart failure (HCC)    Controlled type 2 diabetes mellitus with hyperglycemia, without long-term current use of insulin (HCC)    Phantom limb pain (HCC)    Left below-knee amputee ([place]) 10/25/2021   Left leg pain    Osteomyelitis of ankle and foot ([place]) 10/15/2021   Anemia 10/15/2021   Pressure ulcer 10/15/2021   History of CVA (cerebrovascular accident) 10/15/2021   Septic arthritis of ankle and foot region ([place]) 10/15/2021   AKI (acute kidney injury) ([place]) 10/14/2021   Pneumonia due to COVID-19 virus 09/09/2019   Hypokalemia 09/09/2019   Acute respiratory failure with hypoxia ([place]) 09/09/2019   Essential hypertension 10/14/2014   Chronic diastolic heart failure ([place]) 10/14/2014   Morbid obesity ([place]) 07/22/2013   Varicose veins of lower extremities with other complications 49/82/6415   OSA (obstructive sleep apnea) 07/22/2013   Venous stasis ulcers ([place])  07/22/2013   Diabetes mellitus ([place]) 01/03/2011   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[place])    Diverticulosis    Essential hypertension    Gastritis    History of nephrolithiasis    History of open leg wound    History of stroke 03/19/2003   Lymphedema    Obstructive sleep apnea    Peptic ulcer    TIA (transient ischemic attack)    Varicose veins    Laser ablation of left great saphenous vein 2009    Family History  Problem Relation Age of Onset   Bone cancer Mother    Hypertension Father    Pancreatic cancer Maternal Grandmother    Stroke Paternal Grandmother    Diabetes Paternal Grandfather    Heart attack Brother    Hypertension Brother    Heart attack Sister     Past Surgical History:  Procedure Laterality Date   AMPUTATION Left 10/21/2021   Procedure: LEFT BELOW KNEE AMPUTATION;  Surgeon: [name], MD;  Location: [facility];  Service: Orthopedics;  Laterality: Left;   BIOPSY  10/19/2021   Procedure: BIOPSY;  Surgeon: Sharyn Creamer, MD;  Location: [facility];  Service: Gastroenterology;;   COLONOSCOPY N/A 10/19/2021   Procedure: COLONOSCOPY;  Surgeon: Sharyn Creamer, MD;  Location: [facility];  Service: Gastroenterology;  Laterality: N/A;   CYSTOSCOPY WITH  RETROGRADE PYELOGRAM, URETEROSCOPY AND STENT PLACEMENT Right 08/02/2015   Procedure: CYSTOSCOPY WITH RETROGRADE PYELOGRAM, URETEROSCOPY , STONE EXTRACTION AND STENT PLACEMENT;  Surgeon: Cleon Gustin, MD;  [facility];  Service: Urology;  Laterality: Right;   ESOPHAGOGASTRODUODENOSCOPY (EGD) WITH PROPOFOL N/A 10/19/2021   Procedure: ESOPHAGOGASTRODUODENOSCOPY (EGD) WITH PROPOFOL;  Surgeon: Sharyn Creamer, MD;  Location: [facility];  Service: Gastroenterology;  Laterality: N/A;   HOLMIUM LASER APPLICATION Right 61/53/7943   Procedure: HOLMIUM LASER APPLICATION;  Surgeon: Cleon Gustin, MD;  [facility];  Service: Urology;  Laterality: Right;   KIDNEY STONE  SURGERY     POLYPECTOMY  10/19/2021   Procedure: POLYPECTOMY;  Surgeon: Sharyn Creamer, MD;  Location: [facility];  Service: Gastroenterology;;   [place] Left 06/2010   Laser ablation   Social History   Occupational History   Occupation: controll room operator     Employer: [employer]  Tobacco Use   Smoking status: Never   Smokeless tobacco: Never  Substance and Sexual Activity   Alcohol use: No    Alcohol/week: 0.0 standard drinks of alcohol   Drug use: No   Sexual activity: Not on file

## 2022-04-27 DIAGNOSIS — I110 Hypertensive heart disease with heart failure: Secondary | ICD-10-CM | POA: Diagnosis not present

## 2022-04-27 DIAGNOSIS — R600 Localized edema: Secondary | ICD-10-CM | POA: Diagnosis not present

## 2022-04-27 DIAGNOSIS — L97812 Non-pressure chronic ulcer of other part of right lower leg with fat layer exposed: Secondary | ICD-10-CM | POA: Diagnosis not present

## 2022-04-27 DIAGNOSIS — I5030 Unspecified diastolic (congestive) heart failure: Secondary | ICD-10-CM | POA: Diagnosis not present

## 2022-04-27 DIAGNOSIS — I872 Venous insufficiency (chronic) (peripheral): Secondary | ICD-10-CM | POA: Diagnosis not present

## 2022-04-27 DIAGNOSIS — E119 Type 2 diabetes mellitus without complications: Secondary | ICD-10-CM | POA: Diagnosis not present

## 2022-05-01 DIAGNOSIS — I110 Hypertensive heart disease with heart failure: Secondary | ICD-10-CM | POA: Diagnosis not present

## 2022-05-01 DIAGNOSIS — R600 Localized edema: Secondary | ICD-10-CM | POA: Diagnosis not present

## 2022-05-01 DIAGNOSIS — I872 Venous insufficiency (chronic) (peripheral): Secondary | ICD-10-CM | POA: Diagnosis not present

## 2022-05-01 DIAGNOSIS — I5030 Unspecified diastolic (congestive) heart failure: Secondary | ICD-10-CM | POA: Diagnosis not present

## 2022-05-01 DIAGNOSIS — E119 Type 2 diabetes mellitus without complications: Secondary | ICD-10-CM | POA: Diagnosis not present

## 2022-05-01 DIAGNOSIS — L97812 Non-pressure chronic ulcer of other part of right lower leg with fat layer exposed: Secondary | ICD-10-CM | POA: Diagnosis not present

## 2022-05-02 ENCOUNTER — Ambulatory Visit (INDEPENDENT_AMBULATORY_CARE_PROVIDER_SITE_OTHER): Payer: Medicare Other

## 2022-05-02 ENCOUNTER — Inpatient Hospital Stay: Payer: Medicare Other | Admitting: Physical Medicine and Rehabilitation

## 2022-05-02 DIAGNOSIS — I5032 Chronic diastolic (congestive) heart failure: Secondary | ICD-10-CM | POA: Diagnosis not present

## 2022-05-02 LAB — ECHOCARDIOGRAM COMPLETE
AR max vel: 3 cm2
AV Peak grad: 10.5 mmHg
AV Vena cont: 0.27 cm
Ao pk vel: 1.62 m/s
Area-P 1/2: 2.66 cm2
Calc EF: 44.7 %
MV M vel: 2.36 m/s
MV Peak grad: 22.3 mmHg
P 1/2 time: 2029 msec
S' Lateral: 4.01 cm
Single Plane A2C EF: 46.4 %
Single Plane A4C EF: 40.3 %

## 2022-05-03 DIAGNOSIS — E119 Type 2 diabetes mellitus without complications: Secondary | ICD-10-CM | POA: Diagnosis not present

## 2022-05-03 DIAGNOSIS — I872 Venous insufficiency (chronic) (peripheral): Secondary | ICD-10-CM | POA: Diagnosis not present

## 2022-05-03 DIAGNOSIS — R600 Localized edema: Secondary | ICD-10-CM | POA: Diagnosis not present

## 2022-05-03 DIAGNOSIS — I5030 Unspecified diastolic (congestive) heart failure: Secondary | ICD-10-CM | POA: Diagnosis not present

## 2022-05-03 DIAGNOSIS — I110 Hypertensive heart disease with heart failure: Secondary | ICD-10-CM | POA: Diagnosis not present

## 2022-05-03 DIAGNOSIS — L97812 Non-pressure chronic ulcer of other part of right lower leg with fat layer exposed: Secondary | ICD-10-CM | POA: Diagnosis not present

## 2022-05-04 DIAGNOSIS — R600 Localized edema: Secondary | ICD-10-CM | POA: Diagnosis not present

## 2022-05-04 DIAGNOSIS — L97812 Non-pressure chronic ulcer of other part of right lower leg with fat layer exposed: Secondary | ICD-10-CM | POA: Diagnosis not present

## 2022-05-04 DIAGNOSIS — E119 Type 2 diabetes mellitus without complications: Secondary | ICD-10-CM | POA: Diagnosis not present

## 2022-05-04 DIAGNOSIS — I110 Hypertensive heart disease with heart failure: Secondary | ICD-10-CM | POA: Diagnosis not present

## 2022-05-04 DIAGNOSIS — I5030 Unspecified diastolic (congestive) heart failure: Secondary | ICD-10-CM | POA: Diagnosis not present

## 2022-05-04 DIAGNOSIS — I872 Venous insufficiency (chronic) (peripheral): Secondary | ICD-10-CM | POA: Diagnosis not present

## 2022-05-09 DIAGNOSIS — L97812 Non-pressure chronic ulcer of other part of right lower leg with fat layer exposed: Secondary | ICD-10-CM | POA: Diagnosis not present

## 2022-05-09 DIAGNOSIS — I5030 Unspecified diastolic (congestive) heart failure: Secondary | ICD-10-CM | POA: Diagnosis not present

## 2022-05-09 DIAGNOSIS — I872 Venous insufficiency (chronic) (peripheral): Secondary | ICD-10-CM | POA: Diagnosis not present

## 2022-05-09 DIAGNOSIS — I110 Hypertensive heart disease with heart failure: Secondary | ICD-10-CM | POA: Diagnosis not present

## 2022-05-09 DIAGNOSIS — E119 Type 2 diabetes mellitus without complications: Secondary | ICD-10-CM | POA: Diagnosis not present

## 2022-05-09 DIAGNOSIS — R600 Localized edema: Secondary | ICD-10-CM | POA: Diagnosis not present

## 2022-05-10 ENCOUNTER — Telehealth: Payer: Self-pay | Admitting: *Deleted

## 2022-05-10 ENCOUNTER — Other Ambulatory Visit: Payer: Self-pay | Admitting: *Deleted

## 2022-05-10 DIAGNOSIS — Z79899 Other long term (current) drug therapy: Secondary | ICD-10-CM

## 2022-05-10 DIAGNOSIS — I5032 Chronic diastolic (congestive) heart failure: Secondary | ICD-10-CM

## 2022-05-10 MED ORDER — SPIRONOLACTONE 25 MG PO TABS: 12.5000 mg | ORAL_TABLET | Freq: Every day | ORAL | 1 refills | Status: DC

## 2022-05-10 NOTE — Telephone Encounter (Signed)
-----   Message from Satira Sark, MD sent at 05/02/2022  3:25 PM EDT ----- Results reviewed.  Echocardiogram shows mildly reduced LVEF at 45 to 37% and mild diastolic dysfunction.  Also dilated ascending aorta which can be followed over time.  Lasix adjusted at recent visit.  Suggest adding Aldactone 12.5 mg daily for now.  Follow-up BMET for his office visit.

## 2022-05-10 NOTE — Telephone Encounter (Signed)
Patient informed. Copy sent to PCP Lab order faxed to PCP

## 2022-05-11 DIAGNOSIS — L97812 Non-pressure chronic ulcer of other part of right lower leg with fat layer exposed: Secondary | ICD-10-CM | POA: Diagnosis not present

## 2022-05-11 DIAGNOSIS — I5030 Unspecified diastolic (congestive) heart failure: Secondary | ICD-10-CM | POA: Diagnosis not present

## 2022-05-11 DIAGNOSIS — R600 Localized edema: Secondary | ICD-10-CM | POA: Diagnosis not present

## 2022-05-11 DIAGNOSIS — I872 Venous insufficiency (chronic) (peripheral): Secondary | ICD-10-CM | POA: Diagnosis not present

## 2022-05-11 DIAGNOSIS — E119 Type 2 diabetes mellitus without complications: Secondary | ICD-10-CM | POA: Diagnosis not present

## 2022-05-11 DIAGNOSIS — I110 Hypertensive heart disease with heart failure: Secondary | ICD-10-CM | POA: Diagnosis not present

## 2022-05-12 DIAGNOSIS — I5030 Unspecified diastolic (congestive) heart failure: Secondary | ICD-10-CM | POA: Diagnosis not present

## 2022-05-12 DIAGNOSIS — L97812 Non-pressure chronic ulcer of other part of right lower leg with fat layer exposed: Secondary | ICD-10-CM | POA: Diagnosis not present

## 2022-05-12 DIAGNOSIS — I110 Hypertensive heart disease with heart failure: Secondary | ICD-10-CM | POA: Diagnosis not present

## 2022-05-12 DIAGNOSIS — R600 Localized edema: Secondary | ICD-10-CM | POA: Diagnosis not present

## 2022-05-12 DIAGNOSIS — I872 Venous insufficiency (chronic) (peripheral): Secondary | ICD-10-CM | POA: Diagnosis not present

## 2022-05-12 DIAGNOSIS — E119 Type 2 diabetes mellitus without complications: Secondary | ICD-10-CM | POA: Diagnosis not present

## 2022-05-16 ENCOUNTER — Ambulatory Visit (INDEPENDENT_AMBULATORY_CARE_PROVIDER_SITE_OTHER): Payer: Medicare Other | Admitting: Orthopedic Surgery

## 2022-05-16 ENCOUNTER — Encounter: Payer: Self-pay | Admitting: Orthopedic Surgery

## 2022-05-16 DIAGNOSIS — E669 Obesity, unspecified: Secondary | ICD-10-CM | POA: Diagnosis not present

## 2022-05-16 DIAGNOSIS — L97812 Non-pressure chronic ulcer of other part of right lower leg with fat layer exposed: Secondary | ICD-10-CM | POA: Diagnosis not present

## 2022-05-16 DIAGNOSIS — Z89512 Acquired absence of left leg below knee: Secondary | ICD-10-CM | POA: Diagnosis not present

## 2022-05-16 DIAGNOSIS — E7800 Pure hypercholesterolemia, unspecified: Secondary | ICD-10-CM | POA: Diagnosis not present

## 2022-05-16 DIAGNOSIS — N200 Calculus of kidney: Secondary | ICD-10-CM | POA: Diagnosis not present

## 2022-05-16 DIAGNOSIS — Z7982 Long term (current) use of aspirin: Secondary | ICD-10-CM | POA: Diagnosis not present

## 2022-05-16 DIAGNOSIS — L97122 Non-pressure chronic ulcer of left thigh with fat layer exposed: Secondary | ICD-10-CM | POA: Diagnosis not present

## 2022-05-16 DIAGNOSIS — I5030 Unspecified diastolic (congestive) heart failure: Secondary | ICD-10-CM | POA: Diagnosis not present

## 2022-05-16 DIAGNOSIS — Z8673 Personal history of transient ischemic attack (TIA), and cerebral infarction without residual deficits: Secondary | ICD-10-CM | POA: Diagnosis not present

## 2022-05-16 DIAGNOSIS — Z794 Long term (current) use of insulin: Secondary | ICD-10-CM | POA: Diagnosis not present

## 2022-05-16 DIAGNOSIS — I110 Hypertensive heart disease with heart failure: Secondary | ICD-10-CM | POA: Diagnosis not present

## 2022-05-16 DIAGNOSIS — E119 Type 2 diabetes mellitus without complications: Secondary | ICD-10-CM | POA: Diagnosis not present

## 2022-05-16 DIAGNOSIS — Z7902 Long term (current) use of antithrombotics/antiplatelets: Secondary | ICD-10-CM | POA: Diagnosis not present

## 2022-05-16 DIAGNOSIS — I872 Venous insufficiency (chronic) (peripheral): Secondary | ICD-10-CM | POA: Diagnosis not present

## 2022-05-16 DIAGNOSIS — D649 Anemia, unspecified: Secondary | ICD-10-CM | POA: Diagnosis not present

## 2022-05-16 NOTE — Progress Notes (Signed)
Office Visit Note   Patient: [name] [name]           Date of Birth: 30-Jul-1956           MRN: [id number] Visit Date: 05/16/2022              Requested by: Sasser, Silvestre Moment, MD [address] [place],  [place] [address] PCP: Manon Hilding, MD  Chief Complaint  Patient presents with   Left Leg - Follow-up    10/21/21 left BKA       HPI: Patient is a 66 year old gentleman who is seen in follow-up he is 6 months status post left transtibial amputation he is currently wearing a shrinker he has been wearing his liner and leg but developed a new blister ulcer in the popliteal fossa as well as laterally and has not been ambulating at this time.  Assessment & Plan: Visit Diagnoses:  1. Left below-knee amputee [facility])     Plan: Patient will continue with home health physical therapy.  With patient's progress with therapy he will not be a community ambulator and will be a limited ambulator at home with the prosthetic leg.  Patient will require a motorized wheelchair for ambulation within the home and outside the home.  Follow-Up Instructions: Return in about 4 weeks (around 06/13/2022).   Ortho Exam  Patient is alert, oriented, no adenopathy, well-dressed, normal affect, normal respiratory effort. Examination the residual limb on the left has brawny skin color changes there is no cellulitis.  There is a small skin tear in the popliteal fossa which is healing.  He has a 2 cm in diameter 1 mm deep ulcer over the lateral residual limb there is no cellulitis no odor no drainage no signs of infection.  Imaging: No results found. No images are attached to the encounter.  Labs: Lab Results  Component Value Date   HGBA1C 7.3 (H) 10/17/2021   HGBA1C 6.5 (H) 09/12/2019   CRP 17.6 (H) 09/14/2019   CRP 14.7 (H) 09/13/2019   CRP 14.6 (H) 09/12/2019   REPTSTATUS 11/20/2021 FINAL 11/17/2021   CULT (A) 11/17/2021    >=100,000 COLONIES/mL ESCHERICHIA COLI >=100,000 COLONIES/mL KLEBSIELLA OXYTOCA     LABORGA ESCHERICHIA COLI (A) 11/17/2021   LABORGA KLEBSIELLA OXYTOCA (A) 11/17/2021     Lab Results  Component Value Date   ALBUMIN 2.6 (L) 11/10/2021   ALBUMIN 1.8 (L) 10/25/2021   ALBUMIN 2.5 (L) 10/14/2021    Lab Results  Component Value Date   MG 2.1 09/14/2019   MG 2.3 09/13/2019   MG 1.9 09/12/2019   No results found for: "VD25OH"  No results found for: "PREALBUMIN"    Latest Ref Rng & Units 12/21/2021    9:53 AM 11/10/2021    6:54 AM 10/31/2021    5:00 AM  CBC EXTENDED  WBC 4.0 - 10.5 K/uL 7.6  6.5  7.9   RBC 4.22 - 5.81 Mil/uL 4.57  3.88  3.22   Hemoglobin 13.0 - 17.0 g/dL 11.4  9.5  7.8   HCT 39.0 - 52.0 % 35.6  33.0  26.5   Platelets 150.0 - 400.0 K/uL 230.0  176  243   NEUT# 1.4 - 7.7 K/uL 5.3  4.3    Lymph# 0.7 - 4.0 K/uL 0.9  0.8       There is no height or weight on file to calculate BMI.  Orders:  No orders of the defined types were placed in this encounter.  No orders  of the defined types were placed in this encounter.    Procedures: No procedures performed  Clinical Data: No additional findings.  ROS:  All other systems negative, except as noted in the HPI. Review of Systems  Objective: Vital Signs: There were no vitals taken for this visit.  Specialty Comments:  No specialty comments available.  PMFS History: Patient Active Problem List   Diagnosis Date Noted   Acute on chronic diastolic (congestive) heart failure (HCC)    Controlled type 2 diabetes mellitus with hyperglycemia, without long-term current use of insulin (HCC)    Phantom limb pain (HCC)    Left below-knee amputee ([place]) 10/25/2021   Left leg pain    Osteomyelitis of ankle and foot ([place]) 10/15/2021   Anemia 10/15/2021   Pressure ulcer 10/15/2021   History of CVA (cerebrovascular accident) 10/15/2021   Septic arthritis of ankle and foot region ([place]) 10/15/2021   AKI (acute kidney injury) ([place]) 10/14/2021   Pneumonia due to COVID-19 virus 09/09/2019   Hypokalemia  09/09/2019   Acute respiratory failure with hypoxia ([place]) 09/09/2019   Essential hypertension 10/14/2014   Chronic diastolic heart failure ([place]) 10/14/2014   Morbid obesity ([place]) 07/22/2013   Varicose veins of lower extremities with other complications 27/11/5007   OSA (obstructive sleep apnea) 07/22/2013   Venous stasis ulcers ([place]) 07/22/2013   Diabetes mellitus ([place]) 01/03/2011   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[place])    Diverticulosis    Essential hypertension    Gastritis    History of nephrolithiasis    History of open leg wound    History of stroke 03/19/2003   Lymphedema    Obstructive sleep apnea    Peptic ulcer    TIA (transient ischemic attack)    Varicose veins    Laser ablation of left great saphenous vein 2009    Family History  Problem Relation Age of Onset   Bone cancer Mother    Hypertension Father    Pancreatic cancer Maternal Grandmother    Stroke Paternal Grandmother    Diabetes Paternal Grandfather    Heart attack Brother    Hypertension Brother    Heart attack Sister     Past Surgical History:  Procedure Laterality Date   AMPUTATION Left 10/21/2021   Procedure: LEFT BELOW KNEE AMPUTATION;  Surgeon: [name], MD;  Location: [facility];  Service: Orthopedics;  Laterality: Left;   BIOPSY  10/19/2021   Procedure: BIOPSY;  Surgeon: Sharyn Creamer, MD;  Location: [facility];  Service: Gastroenterology;;   COLONOSCOPY N/A 10/19/2021   Procedure: COLONOSCOPY;  Surgeon: Sharyn Creamer, MD;  Location: [facility];  Service: Gastroenterology;  Laterality: N/A;   CYSTOSCOPY WITH RETROGRADE PYELOGRAM, URETEROSCOPY AND STENT PLACEMENT Right 08/02/2015   Procedure: CYSTOSCOPY WITH RETROGRADE PYELOGRAM, URETEROSCOPY , STONE EXTRACTION AND STENT PLACEMENT;  Surgeon: Cleon Gustin, MD;  [facility];  Service: Urology;  Laterality: Right;   ESOPHAGOGASTRODUODENOSCOPY (EGD) WITH PROPOFOL N/A 10/19/2021   Procedure:  ESOPHAGOGASTRODUODENOSCOPY (EGD) WITH PROPOFOL;  Surgeon: Sharyn Creamer, MD;  Location: [facility];  Service: Gastroenterology;  Laterality: N/A;   HOLMIUM LASER APPLICATION Right 38/18/2993   Procedure: HOLMIUM LASER APPLICATION;  Surgeon: Cleon Gustin, MD;  [facility];  Service: Urology;  Laterality: Right;   KIDNEY STONE SURGERY     POLYPECTOMY  10/19/2021   Procedure: POLYPECTOMY;  Surgeon: Sharyn Creamer, MD;  Location: [facility];  Service: Gastroenterology;;  VASECTOMY  1984   VEIN SURGERY Left 06/2010   Laser ablation   Social History   Occupational History   Occupation: Medical laboratory scientific officer     Employer: [employer]  Tobacco Use   Smoking status: Never   Smokeless tobacco: Never  Substance and Sexual Activity   Alcohol use: No    Alcohol/week: 0.0 standard drinks of alcohol   Drug use: No   Sexual activity: Not on file

## 2022-05-17 DIAGNOSIS — L97122 Non-pressure chronic ulcer of left thigh with fat layer exposed: Secondary | ICD-10-CM | POA: Diagnosis not present

## 2022-05-17 DIAGNOSIS — I110 Hypertensive heart disease with heart failure: Secondary | ICD-10-CM | POA: Diagnosis not present

## 2022-05-17 DIAGNOSIS — L97812 Non-pressure chronic ulcer of other part of right lower leg with fat layer exposed: Secondary | ICD-10-CM | POA: Diagnosis not present

## 2022-05-17 DIAGNOSIS — I5030 Unspecified diastolic (congestive) heart failure: Secondary | ICD-10-CM | POA: Diagnosis not present

## 2022-05-17 DIAGNOSIS — E119 Type 2 diabetes mellitus without complications: Secondary | ICD-10-CM | POA: Diagnosis not present

## 2022-05-17 DIAGNOSIS — I872 Venous insufficiency (chronic) (peripheral): Secondary | ICD-10-CM | POA: Diagnosis not present

## 2022-05-18 DIAGNOSIS — L97812 Non-pressure chronic ulcer of other part of right lower leg with fat layer exposed: Secondary | ICD-10-CM | POA: Diagnosis not present

## 2022-05-18 DIAGNOSIS — E119 Type 2 diabetes mellitus without complications: Secondary | ICD-10-CM | POA: Diagnosis not present

## 2022-05-18 DIAGNOSIS — I110 Hypertensive heart disease with heart failure: Secondary | ICD-10-CM | POA: Diagnosis not present

## 2022-05-18 DIAGNOSIS — L97122 Non-pressure chronic ulcer of left thigh with fat layer exposed: Secondary | ICD-10-CM | POA: Diagnosis not present

## 2022-05-18 DIAGNOSIS — I872 Venous insufficiency (chronic) (peripheral): Secondary | ICD-10-CM | POA: Diagnosis not present

## 2022-05-18 DIAGNOSIS — I5030 Unspecified diastolic (congestive) heart failure: Secondary | ICD-10-CM | POA: Diagnosis not present

## 2022-05-19 ENCOUNTER — Telehealth: Payer: Self-pay | Admitting: Cardiology

## 2022-05-19 DIAGNOSIS — I5032 Chronic diastolic (congestive) heart failure: Secondary | ICD-10-CM

## 2022-05-19 DIAGNOSIS — I10 Essential (primary) hypertension: Secondary | ICD-10-CM

## 2022-05-19 NOTE — Telephone Encounter (Signed)
Will need to fax order to [facility].  ATTNDonella Stade   Fax:  [phone]  BMET order faxed now.

## 2022-05-19 NOTE — Telephone Encounter (Signed)
Patient would like his home health nurse, Janett Billow to do the blood draw for his lab work.  Patient requested his [place] be contacted at ph# [phone] to coordinate getting the orders to his nurse.

## 2022-05-22 DIAGNOSIS — I110 Hypertensive heart disease with heart failure: Secondary | ICD-10-CM | POA: Diagnosis not present

## 2022-05-22 DIAGNOSIS — E119 Type 2 diabetes mellitus without complications: Secondary | ICD-10-CM | POA: Diagnosis not present

## 2022-05-22 DIAGNOSIS — I5030 Unspecified diastolic (congestive) heart failure: Secondary | ICD-10-CM | POA: Diagnosis not present

## 2022-05-22 DIAGNOSIS — L97122 Non-pressure chronic ulcer of left thigh with fat layer exposed: Secondary | ICD-10-CM | POA: Diagnosis not present

## 2022-05-22 DIAGNOSIS — I872 Venous insufficiency (chronic) (peripheral): Secondary | ICD-10-CM | POA: Diagnosis not present

## 2022-05-22 DIAGNOSIS — L97812 Non-pressure chronic ulcer of other part of right lower leg with fat layer exposed: Secondary | ICD-10-CM | POA: Diagnosis not present

## 2022-05-24 DIAGNOSIS — I5030 Unspecified diastolic (congestive) heart failure: Secondary | ICD-10-CM | POA: Diagnosis not present

## 2022-05-24 DIAGNOSIS — E119 Type 2 diabetes mellitus without complications: Secondary | ICD-10-CM | POA: Diagnosis not present

## 2022-05-24 DIAGNOSIS — L97122 Non-pressure chronic ulcer of left thigh with fat layer exposed: Secondary | ICD-10-CM | POA: Diagnosis not present

## 2022-05-24 DIAGNOSIS — I110 Hypertensive heart disease with heart failure: Secondary | ICD-10-CM | POA: Diagnosis not present

## 2022-05-24 DIAGNOSIS — L97812 Non-pressure chronic ulcer of other part of right lower leg with fat layer exposed: Secondary | ICD-10-CM | POA: Diagnosis not present

## 2022-05-24 DIAGNOSIS — I872 Venous insufficiency (chronic) (peripheral): Secondary | ICD-10-CM | POA: Diagnosis not present

## 2022-05-25 DIAGNOSIS — E1129 Type 2 diabetes mellitus with other diabetic kidney complication: Secondary | ICD-10-CM | POA: Diagnosis not present

## 2022-05-25 DIAGNOSIS — I110 Hypertensive heart disease with heart failure: Secondary | ICD-10-CM | POA: Diagnosis not present

## 2022-05-25 DIAGNOSIS — I872 Venous insufficiency (chronic) (peripheral): Secondary | ICD-10-CM | POA: Diagnosis not present

## 2022-05-25 DIAGNOSIS — L97812 Non-pressure chronic ulcer of other part of right lower leg with fat layer exposed: Secondary | ICD-10-CM | POA: Diagnosis not present

## 2022-05-25 DIAGNOSIS — I10 Essential (primary) hypertension: Secondary | ICD-10-CM | POA: Diagnosis not present

## 2022-05-25 DIAGNOSIS — I5030 Unspecified diastolic (congestive) heart failure: Secondary | ICD-10-CM | POA: Diagnosis not present

## 2022-05-25 DIAGNOSIS — L97122 Non-pressure chronic ulcer of left thigh with fat layer exposed: Secondary | ICD-10-CM | POA: Diagnosis not present

## 2022-05-25 DIAGNOSIS — E119 Type 2 diabetes mellitus without complications: Secondary | ICD-10-CM | POA: Diagnosis not present

## 2022-05-25 DIAGNOSIS — I5032 Chronic diastolic (congestive) heart failure: Secondary | ICD-10-CM | POA: Diagnosis not present

## 2022-05-29 DIAGNOSIS — L97812 Non-pressure chronic ulcer of other part of right lower leg with fat layer exposed: Secondary | ICD-10-CM | POA: Diagnosis not present

## 2022-05-29 DIAGNOSIS — I5030 Unspecified diastolic (congestive) heart failure: Secondary | ICD-10-CM | POA: Diagnosis not present

## 2022-05-29 DIAGNOSIS — I872 Venous insufficiency (chronic) (peripheral): Secondary | ICD-10-CM | POA: Diagnosis not present

## 2022-05-29 DIAGNOSIS — E119 Type 2 diabetes mellitus without complications: Secondary | ICD-10-CM | POA: Diagnosis not present

## 2022-05-29 DIAGNOSIS — I110 Hypertensive heart disease with heart failure: Secondary | ICD-10-CM | POA: Diagnosis not present

## 2022-05-29 DIAGNOSIS — L97122 Non-pressure chronic ulcer of left thigh with fat layer exposed: Secondary | ICD-10-CM | POA: Diagnosis not present

## 2022-05-30 DIAGNOSIS — I10 Essential (primary) hypertension: Secondary | ICD-10-CM | POA: Diagnosis not present

## 2022-05-30 DIAGNOSIS — E1121 Type 2 diabetes mellitus with diabetic nephropathy: Secondary | ICD-10-CM | POA: Diagnosis not present

## 2022-05-30 DIAGNOSIS — Z8673 Personal history of transient ischemic attack (TIA), and cerebral infarction without residual deficits: Secondary | ICD-10-CM | POA: Diagnosis not present

## 2022-05-30 DIAGNOSIS — E7801 Familial hypercholesterolemia: Secondary | ICD-10-CM | POA: Diagnosis not present

## 2022-05-30 DIAGNOSIS — E1129 Type 2 diabetes mellitus with other diabetic kidney complication: Secondary | ICD-10-CM | POA: Diagnosis not present

## 2022-05-30 DIAGNOSIS — R296 Repeated falls: Secondary | ICD-10-CM | POA: Diagnosis not present

## 2022-05-30 DIAGNOSIS — Z23 Encounter for immunization: Secondary | ICD-10-CM | POA: Diagnosis not present

## 2022-05-30 DIAGNOSIS — G4733 Obstructive sleep apnea (adult) (pediatric): Secondary | ICD-10-CM | POA: Diagnosis not present

## 2022-05-31 DIAGNOSIS — I872 Venous insufficiency (chronic) (peripheral): Secondary | ICD-10-CM | POA: Diagnosis not present

## 2022-05-31 DIAGNOSIS — I110 Hypertensive heart disease with heart failure: Secondary | ICD-10-CM | POA: Diagnosis not present

## 2022-05-31 DIAGNOSIS — I5030 Unspecified diastolic (congestive) heart failure: Secondary | ICD-10-CM | POA: Diagnosis not present

## 2022-05-31 DIAGNOSIS — L97122 Non-pressure chronic ulcer of left thigh with fat layer exposed: Secondary | ICD-10-CM | POA: Diagnosis not present

## 2022-05-31 DIAGNOSIS — E119 Type 2 diabetes mellitus without complications: Secondary | ICD-10-CM | POA: Diagnosis not present

## 2022-05-31 DIAGNOSIS — L97812 Non-pressure chronic ulcer of other part of right lower leg with fat layer exposed: Secondary | ICD-10-CM | POA: Diagnosis not present

## 2022-06-01 ENCOUNTER — Encounter: Payer: Self-pay | Admitting: *Deleted

## 2022-06-02 DIAGNOSIS — D649 Anemia, unspecified: Secondary | ICD-10-CM | POA: Diagnosis not present

## 2022-06-02 DIAGNOSIS — E7800 Pure hypercholesterolemia, unspecified: Secondary | ICD-10-CM | POA: Diagnosis not present

## 2022-06-02 DIAGNOSIS — L97812 Non-pressure chronic ulcer of other part of right lower leg with fat layer exposed: Secondary | ICD-10-CM | POA: Diagnosis not present

## 2022-06-02 DIAGNOSIS — L97122 Non-pressure chronic ulcer of left thigh with fat layer exposed: Secondary | ICD-10-CM | POA: Diagnosis not present

## 2022-06-02 DIAGNOSIS — E119 Type 2 diabetes mellitus without complications: Secondary | ICD-10-CM | POA: Diagnosis not present

## 2022-06-02 DIAGNOSIS — I5030 Unspecified diastolic (congestive) heart failure: Secondary | ICD-10-CM | POA: Diagnosis not present

## 2022-06-02 DIAGNOSIS — I110 Hypertensive heart disease with heart failure: Secondary | ICD-10-CM | POA: Diagnosis not present

## 2022-06-02 DIAGNOSIS — I872 Venous insufficiency (chronic) (peripheral): Secondary | ICD-10-CM | POA: Diagnosis not present

## 2022-06-05 DIAGNOSIS — L97812 Non-pressure chronic ulcer of other part of right lower leg with fat layer exposed: Secondary | ICD-10-CM | POA: Diagnosis not present

## 2022-06-05 DIAGNOSIS — I872 Venous insufficiency (chronic) (peripheral): Secondary | ICD-10-CM | POA: Diagnosis not present

## 2022-06-05 DIAGNOSIS — I110 Hypertensive heart disease with heart failure: Secondary | ICD-10-CM | POA: Diagnosis not present

## 2022-06-05 DIAGNOSIS — E119 Type 2 diabetes mellitus without complications: Secondary | ICD-10-CM | POA: Diagnosis not present

## 2022-06-05 DIAGNOSIS — L97122 Non-pressure chronic ulcer of left thigh with fat layer exposed: Secondary | ICD-10-CM | POA: Diagnosis not present

## 2022-06-05 DIAGNOSIS — I5030 Unspecified diastolic (congestive) heart failure: Secondary | ICD-10-CM | POA: Diagnosis not present

## 2022-06-06 DIAGNOSIS — L97122 Non-pressure chronic ulcer of left thigh with fat layer exposed: Secondary | ICD-10-CM | POA: Diagnosis not present

## 2022-06-06 DIAGNOSIS — L97812 Non-pressure chronic ulcer of other part of right lower leg with fat layer exposed: Secondary | ICD-10-CM | POA: Diagnosis not present

## 2022-06-06 DIAGNOSIS — I110 Hypertensive heart disease with heart failure: Secondary | ICD-10-CM | POA: Diagnosis not present

## 2022-06-06 DIAGNOSIS — E119 Type 2 diabetes mellitus without complications: Secondary | ICD-10-CM | POA: Diagnosis not present

## 2022-06-06 DIAGNOSIS — I872 Venous insufficiency (chronic) (peripheral): Secondary | ICD-10-CM | POA: Diagnosis not present

## 2022-06-06 DIAGNOSIS — I5030 Unspecified diastolic (congestive) heart failure: Secondary | ICD-10-CM | POA: Diagnosis not present

## 2022-06-07 ENCOUNTER — Encounter: Payer: Self-pay | Admitting: Cardiology

## 2022-06-07 ENCOUNTER — Ambulatory Visit: Payer: Medicare Other | Attending: Cardiology | Admitting: Cardiology

## 2022-06-07 VITALS — BP 116/80 | HR 57 | Ht 70.0 in

## 2022-06-07 DIAGNOSIS — I429 Cardiomyopathy, unspecified: Secondary | ICD-10-CM | POA: Insufficient documentation

## 2022-06-07 DIAGNOSIS — I10 Essential (primary) hypertension: Secondary | ICD-10-CM | POA: Insufficient documentation

## 2022-06-07 DIAGNOSIS — I5032 Chronic diastolic (congestive) heart failure: Secondary | ICD-10-CM | POA: Insufficient documentation

## 2022-06-07 NOTE — Patient Instructions (Addendum)
Medication Instructions:   Your physician recommends that you continue on your current medications as directed. Please refer to the Current Medication list given to you today.  Labwork:  none  Testing/Procedures:  none  Follow-Up:  Your physician recommends that you schedule a follow-up appointment in: 4 months.   Any Other Special Instructions Will Be Listed Below (If Applicable).  If you need a refill on your cardiac medications before your next appointment, please call your pharmacy. 

## 2022-06-07 NOTE — Progress Notes (Signed)
Cardiology Office Note  Date: 06/07/2022   ID: [name], [name] 1956-02-05, MRN [id number]  PCP:  [name] Hilding, MD  Cardiologist:  [name] Lesches, MD Electrophysiologist:  None   Chief Complaint  Patient presents with   Cardiac follow-up    History of Present Illness: [name] [name] is a 66 y.o. male last seen in August.  He is here today with his fiance.  Not able to stand to weigh, still working on being stable on his left leg prosthesis and will hopefully be able to use his home scale soon.  At last visit Lasix was recommended at least 40 mg daily and since then he was also placed on Aldactone.  Follow-up lab work noted below.  He has actually been taking Lasix 80 mg most days of the week when he does not have to be on the afternoons, otherwise 40 mg daily.  Started on Ozempic by PCP.  The remainder of his medications are stable.  Recent echocardiogram shows LVEF 45 to 50% range with global hypokinesis and severe LVH, mild diastolic dysfunction, low normal RV contraction, mild aortic regurgitation, and moderately dilated ascending aorta at 43 mm.  We discussed these results today and we will continue with GDMT as tolerated.  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[place])    Diverticulosis    Essential hypertension    Gastritis    History of nephrolithiasis    History of open leg wound    History of stroke 03/19/2003   Lymphedema    Obstructive sleep apnea    Peptic ulcer    TIA (transient ischemic attack)    Varicose veins    Laser ablation of left great saphenous vein 2009    Past Surgical History:  Procedure Laterality Date   AMPUTATION Left 10/21/2021   Procedure: LEFT BELOW KNEE AMPUTATION;  Surgeon: [name] Minion, MD;  Location: [facility];  Service: Orthopedics;  Laterality: Left;   BIOPSY  10/19/2021   Procedure: BIOPSY;  Surgeon: [name] Creamer, MD;  Location: [facility];  Service: Gastroenterology;;   COLONOSCOPY  N/A 10/19/2021   Procedure: COLONOSCOPY;  Surgeon: [name] Creamer, MD;  Location: [facility];  Service: Gastroenterology;  Laterality: N/A;   CYSTOSCOPY WITH RETROGRADE PYELOGRAM, URETEROSCOPY AND STENT PLACEMENT Right 08/02/2015   Procedure: CYSTOSCOPY WITH RETROGRADE PYELOGRAM, URETEROSCOPY , STONE EXTRACTION AND STENT PLACEMENT;  Surgeon: [name] Gustin, MD;  [facility];  Service: Urology;  Laterality: Right;   ESOPHAGOGASTRODUODENOSCOPY (EGD) WITH PROPOFOL N/A 10/19/2021   Procedure: ESOPHAGOGASTRODUODENOSCOPY (EGD) WITH PROPOFOL;  Surgeon: [name] Creamer, MD;  Location: [facility];  Service: Gastroenterology;  Laterality: N/A;   HOLMIUM LASER APPLICATION Right 94/70/9628   Procedure: HOLMIUM LASER APPLICATION;  Surgeon: [name] Gustin, MD;  [facility];  Service: Urology;  Laterality: Right;   KIDNEY STONE SURGERY     POLYPECTOMY  10/19/2021   Procedure: POLYPECTOMY;  Surgeon: [name] Creamer, MD;  Location: [facility];  Service: Gastroenterology;;   [place] Left 06/2010   Laser ablation    Current Outpatient Medications  Medication Sig Dispense Refill   acetaminophen (TYLENOL) 325 MG tablet Take 1-2 tablets (325-650 mg total) by mouth every 6 (six) hours as needed for mild pain (pain score 1-3 or temp > 100.5).     allopurinol (ZYLOPRIM) 300 MG tablet Take 300 mg by mouth daily.     Ascorbic Acid (  VITAMIN C) 1000 MG tablet Take 1,000 mg by mouth as needed.     aspirin EC 81 MG tablet Take 1 tablet (81 mg total) by mouth daily. 90 tablet 3   atorvastatin (LIPITOR) 10 MG tablet Take 10 mg by mouth daily.     clopidogrel (PLAVIX) 75 MG tablet Take 75 mg by mouth daily.     ferrous sulfate 325 (65 FE) MG tablet Take 1 tablet (325 mg total) by mouth daily with breakfast.  3   furosemide (LASIX) 40 MG tablet Take 40 mg by mouth daily.     gabapentin (NEURONTIN) 100 MG capsule Take 1 capsule (100 mg total) by mouth at bedtime.     hydrALAZINE  (APRESOLINE) 50 MG tablet Take 50 mg by mouth 3 (three) times daily.     metoprolol succinate (TOPROL-XL) 25 MG 24 hr tablet Take 1 tablet (25 mg total) by mouth daily. (Patient taking differently: Take 50 mg by mouth daily.) 30 tablet 0   Semaglutide (OZEMPIC, 0.25 OR 0.5 MG/DOSE, [place]) Inject into the skin once a week.     spironolactone (ALDACTONE) 25 MG tablet Take 0.5 tablets (12.5 mg total) by mouth daily. 45 tablet 1   No current facility-administered medications for this visit.   Allergies:  Patient has no known allergies.   ROS: No palpitations or chest pain.  Physical Exam: VS:  BP 116/80 (BP Location: Left Arm, Patient Position: Sitting, Cuff Size: Large)   Pulse (!) 57   Ht '5\' 10"'$  (1.778 m)   SpO2 91%   BMI 44.48 kg/m , BMI Body mass index is 44.48 kg/m.  Wt Readings from Last 3 Encounters:  04/24/22 (!) 310 lb (140.6 kg)  11/17/21 (!) 326 lb 4.5 oz (148 kg)  10/15/21 (!) 351 lb (159.2 kg)    General: Patient appears comfortable at rest.  In wheelchair. HEENT: Conjunctiva and lids normal. Neck: Supple, no elevated JVP. Lungs: Clear to auscultation, nonlabored breathing at rest. Cardiac: Regular rate and rhythm, no S3, 1/6 systolic murmur. Abdomen: Protuberant, bowel sounds present. Extremities: Lymphedema with right lower extremity wrapped, left leg status post BKA..  ECG:  An ECG dated 04/24/2022 was personally reviewed today and demonstrated:  Sinus bradycardia with prolonged PR interval.  Recent Labwork: 10/20/2021: B Natriuretic Peptide 538.5 11/10/2021: ALT 16; AST 14 11/14/2021: BUN 20; Creatinine, Ser 1.21; Potassium 4.1; Sodium 140 12/21/2021: Hemoglobin 11.4; Platelets 230.0  August 2023: Potassium 4.0, BUN 15, creatinine 0.86  Other Studies Reviewed Today:  Echocardiogram 05/02/2022:  1. Left ventricular ejection fraction, by estimation, is 45 to 50%. The  left ventricle has mildly decreased function. The left ventricle  demonstrates global hypokinesis. The  left ventricular internal cavity size  was mildly dilated. There is severe left  ventricular hypertrophy. Left ventricular diastolic parameters are  consistent with Grade I diastolic dysfunction (impaired relaxation). The  average left ventricular global longitudinal strain is -13.9 %. The global  longitudinal strain is abnormal.   2. Right ventricular systolic function is low normal. The right  ventricular size is normal. Tricuspid regurgitation signal is inadequate  for assessing PA pressure.   3. The mitral valve is grossly normal. Trivial mitral valve  regurgitation.   4. The aortic valve is tricuspid. Aortic valve regurgitation is mild.  Aortic valve sclerosis is present, with no evidence of aortic valve  stenosis.   5. Aortic dilatation noted. There is moderate dilatation of the ascending  aorta, measuring 43 mm.   6. The inferior vena cava  is normal in size with greater than 50%  respiratory variability, suggesting right atrial pressure of 3 mmHg.   Assessment and Plan:  1.  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  Continue Lasix at present dosing, 80 mg daily most days of the week except when he is out of the house in the afternoon when he takes 40 mg only in the morning.  Continue Aldactone, Toprol-XL, and hydralazine.  Recheck BMET for next visit.  He was started on Ozempic by PCP and hopefully will lose some weight with this as well.  2.  Essential hypertension, blood pressure is well controlled today.  3.  History of TIA, continues on Lipitor and Plavix.  Medication Adjustments/Labs and Tests Ordered: Current medicines are reviewed at length with the patient today.  Concerns regarding medicines are outlined above.   Tests Ordered: No orders of the defined types were placed in this encounter.   Medication Changes: No orders of the defined types were placed in this encounter.   Disposition:  Follow up  January  Signed, Vander Kueker  G. Hazely Sealey, MD, [facility] 06/07/2022 3:37 PM    [facility] [place], [place], [place] [address] Phone: [phone]; Fax: [phone]

## 2022-06-08 DIAGNOSIS — L97122 Non-pressure chronic ulcer of left thigh with fat layer exposed: Secondary | ICD-10-CM | POA: Diagnosis not present

## 2022-06-08 DIAGNOSIS — I872 Venous insufficiency (chronic) (peripheral): Secondary | ICD-10-CM | POA: Diagnosis not present

## 2022-06-08 DIAGNOSIS — I5030 Unspecified diastolic (congestive) heart failure: Secondary | ICD-10-CM | POA: Diagnosis not present

## 2022-06-08 DIAGNOSIS — I110 Hypertensive heart disease with heart failure: Secondary | ICD-10-CM | POA: Diagnosis not present

## 2022-06-08 DIAGNOSIS — L97812 Non-pressure chronic ulcer of other part of right lower leg with fat layer exposed: Secondary | ICD-10-CM | POA: Diagnosis not present

## 2022-06-08 DIAGNOSIS — E119 Type 2 diabetes mellitus without complications: Secondary | ICD-10-CM | POA: Diagnosis not present

## 2022-06-12 DIAGNOSIS — L97812 Non-pressure chronic ulcer of other part of right lower leg with fat layer exposed: Secondary | ICD-10-CM | POA: Diagnosis not present

## 2022-06-12 DIAGNOSIS — L97122 Non-pressure chronic ulcer of left thigh with fat layer exposed: Secondary | ICD-10-CM | POA: Diagnosis not present

## 2022-06-12 DIAGNOSIS — I110 Hypertensive heart disease with heart failure: Secondary | ICD-10-CM | POA: Diagnosis not present

## 2022-06-12 DIAGNOSIS — I5030 Unspecified diastolic (congestive) heart failure: Secondary | ICD-10-CM | POA: Diagnosis not present

## 2022-06-12 DIAGNOSIS — I872 Venous insufficiency (chronic) (peripheral): Secondary | ICD-10-CM | POA: Diagnosis not present

## 2022-06-12 DIAGNOSIS — E119 Type 2 diabetes mellitus without complications: Secondary | ICD-10-CM | POA: Diagnosis not present

## 2022-06-15 ENCOUNTER — Ambulatory Visit: Payer: Medicare Other | Admitting: Orthopedic Surgery

## 2022-06-15 DIAGNOSIS — Z7902 Long term (current) use of antithrombotics/antiplatelets: Secondary | ICD-10-CM | POA: Diagnosis not present

## 2022-06-15 DIAGNOSIS — I110 Hypertensive heart disease with heart failure: Secondary | ICD-10-CM | POA: Diagnosis not present

## 2022-06-15 DIAGNOSIS — E669 Obesity, unspecified: Secondary | ICD-10-CM | POA: Diagnosis not present

## 2022-06-15 DIAGNOSIS — I872 Venous insufficiency (chronic) (peripheral): Secondary | ICD-10-CM | POA: Diagnosis not present

## 2022-06-15 DIAGNOSIS — L97122 Non-pressure chronic ulcer of left thigh with fat layer exposed: Secondary | ICD-10-CM | POA: Diagnosis not present

## 2022-06-15 DIAGNOSIS — Z794 Long term (current) use of insulin: Secondary | ICD-10-CM | POA: Diagnosis not present

## 2022-06-15 DIAGNOSIS — E119 Type 2 diabetes mellitus without complications: Secondary | ICD-10-CM | POA: Diagnosis not present

## 2022-06-15 DIAGNOSIS — Z7982 Long term (current) use of aspirin: Secondary | ICD-10-CM | POA: Diagnosis not present

## 2022-06-15 DIAGNOSIS — L97812 Non-pressure chronic ulcer of other part of right lower leg with fat layer exposed: Secondary | ICD-10-CM | POA: Diagnosis not present

## 2022-06-15 DIAGNOSIS — Z89512 Acquired absence of left leg below knee: Secondary | ICD-10-CM | POA: Diagnosis not present

## 2022-06-15 DIAGNOSIS — I5030 Unspecified diastolic (congestive) heart failure: Secondary | ICD-10-CM | POA: Diagnosis not present

## 2022-06-15 DIAGNOSIS — N200 Calculus of kidney: Secondary | ICD-10-CM | POA: Diagnosis not present

## 2022-06-15 DIAGNOSIS — D649 Anemia, unspecified: Secondary | ICD-10-CM | POA: Diagnosis not present

## 2022-06-15 DIAGNOSIS — Z8673 Personal history of transient ischemic attack (TIA), and cerebral infarction without residual deficits: Secondary | ICD-10-CM | POA: Diagnosis not present

## 2022-06-15 DIAGNOSIS — E7800 Pure hypercholesterolemia, unspecified: Secondary | ICD-10-CM | POA: Diagnosis not present

## 2022-06-16 DIAGNOSIS — E119 Type 2 diabetes mellitus without complications: Secondary | ICD-10-CM | POA: Diagnosis not present

## 2022-06-16 DIAGNOSIS — I110 Hypertensive heart disease with heart failure: Secondary | ICD-10-CM | POA: Diagnosis not present

## 2022-06-16 DIAGNOSIS — I872 Venous insufficiency (chronic) (peripheral): Secondary | ICD-10-CM | POA: Diagnosis not present

## 2022-06-16 DIAGNOSIS — L97122 Non-pressure chronic ulcer of left thigh with fat layer exposed: Secondary | ICD-10-CM | POA: Diagnosis not present

## 2022-06-16 DIAGNOSIS — L97812 Non-pressure chronic ulcer of other part of right lower leg with fat layer exposed: Secondary | ICD-10-CM | POA: Diagnosis not present

## 2022-06-16 DIAGNOSIS — I5030 Unspecified diastolic (congestive) heart failure: Secondary | ICD-10-CM | POA: Diagnosis not present

## 2022-06-20 DIAGNOSIS — E119 Type 2 diabetes mellitus without complications: Secondary | ICD-10-CM | POA: Diagnosis not present

## 2022-06-20 DIAGNOSIS — I5030 Unspecified diastolic (congestive) heart failure: Secondary | ICD-10-CM | POA: Diagnosis not present

## 2022-06-20 DIAGNOSIS — L97812 Non-pressure chronic ulcer of other part of right lower leg with fat layer exposed: Secondary | ICD-10-CM | POA: Diagnosis not present

## 2022-06-20 DIAGNOSIS — I872 Venous insufficiency (chronic) (peripheral): Secondary | ICD-10-CM | POA: Diagnosis not present

## 2022-06-20 DIAGNOSIS — I110 Hypertensive heart disease with heart failure: Secondary | ICD-10-CM | POA: Diagnosis not present

## 2022-06-20 DIAGNOSIS — L97122 Non-pressure chronic ulcer of left thigh with fat layer exposed: Secondary | ICD-10-CM | POA: Diagnosis not present

## 2022-06-23 DIAGNOSIS — L97122 Non-pressure chronic ulcer of left thigh with fat layer exposed: Secondary | ICD-10-CM | POA: Diagnosis not present

## 2022-06-23 DIAGNOSIS — L97812 Non-pressure chronic ulcer of other part of right lower leg with fat layer exposed: Secondary | ICD-10-CM | POA: Diagnosis not present

## 2022-06-23 DIAGNOSIS — I110 Hypertensive heart disease with heart failure: Secondary | ICD-10-CM | POA: Diagnosis not present

## 2022-06-23 DIAGNOSIS — E119 Type 2 diabetes mellitus without complications: Secondary | ICD-10-CM | POA: Diagnosis not present

## 2022-06-23 DIAGNOSIS — I872 Venous insufficiency (chronic) (peripheral): Secondary | ICD-10-CM | POA: Diagnosis not present

## 2022-06-23 DIAGNOSIS — I5030 Unspecified diastolic (congestive) heart failure: Secondary | ICD-10-CM | POA: Diagnosis not present

## 2022-06-26 DIAGNOSIS — E119 Type 2 diabetes mellitus without complications: Secondary | ICD-10-CM | POA: Diagnosis not present

## 2022-06-26 DIAGNOSIS — L97122 Non-pressure chronic ulcer of left thigh with fat layer exposed: Secondary | ICD-10-CM | POA: Diagnosis not present

## 2022-06-26 DIAGNOSIS — L97812 Non-pressure chronic ulcer of other part of right lower leg with fat layer exposed: Secondary | ICD-10-CM | POA: Diagnosis not present

## 2022-06-26 DIAGNOSIS — I110 Hypertensive heart disease with heart failure: Secondary | ICD-10-CM | POA: Diagnosis not present

## 2022-06-26 DIAGNOSIS — I5030 Unspecified diastolic (congestive) heart failure: Secondary | ICD-10-CM | POA: Diagnosis not present

## 2022-06-26 DIAGNOSIS — I872 Venous insufficiency (chronic) (peripheral): Secondary | ICD-10-CM | POA: Diagnosis not present

## 2022-06-28 DIAGNOSIS — I110 Hypertensive heart disease with heart failure: Secondary | ICD-10-CM | POA: Diagnosis not present

## 2022-06-28 DIAGNOSIS — L97812 Non-pressure chronic ulcer of other part of right lower leg with fat layer exposed: Secondary | ICD-10-CM | POA: Diagnosis not present

## 2022-06-28 DIAGNOSIS — E119 Type 2 diabetes mellitus without complications: Secondary | ICD-10-CM | POA: Diagnosis not present

## 2022-06-28 DIAGNOSIS — I872 Venous insufficiency (chronic) (peripheral): Secondary | ICD-10-CM | POA: Diagnosis not present

## 2022-06-28 DIAGNOSIS — L97122 Non-pressure chronic ulcer of left thigh with fat layer exposed: Secondary | ICD-10-CM | POA: Diagnosis not present

## 2022-06-28 DIAGNOSIS — I5030 Unspecified diastolic (congestive) heart failure: Secondary | ICD-10-CM | POA: Diagnosis not present

## 2022-06-29 ENCOUNTER — Ambulatory Visit (INDEPENDENT_AMBULATORY_CARE_PROVIDER_SITE_OTHER): Payer: Medicare Other | Admitting: Orthopedic Surgery

## 2022-06-29 DIAGNOSIS — L97919 Non-pressure chronic ulcer of unspecified part of right lower leg with unspecified severity: Secondary | ICD-10-CM

## 2022-06-29 DIAGNOSIS — Z89512 Acquired absence of left leg below knee: Secondary | ICD-10-CM

## 2022-06-29 DIAGNOSIS — I83019 Varicose veins of right lower extremity with ulcer of unspecified site: Secondary | ICD-10-CM

## 2022-06-30 ENCOUNTER — Encounter: Payer: Self-pay | Admitting: Orthopedic Surgery

## 2022-06-30 NOTE — Progress Notes (Signed)
Office Visit Note   Patient: [name] [name]           Date of Birth: 04-23-1956           MRN: [id number] Visit Date: 06/29/2022              Requested by: Sasser, Silvestre Moment, MD [place],  [place] [address] PCP: Manon Hilding, MD  Chief Complaint  Patient presents with   Left Leg - Follow-up    10/21/2021 left BKA       HPI: Patient is a 66 year old gentleman who presents for 2 separate issues.  #1 left transtibial amputation with ulcer over the lateral aspect of the left knee.  #2 venous insufficiency ulcerations with bleeding right lower extremity currently undergoing serial compression wraps.  Patient states he has been on Plavix for 19 years for a previous stroke.  Assessment & Plan: Visit Diagnoses:  1. Left below-knee amputee ([place])   2. Venous ulcer of right leg ([place])     Plan: Patient will follow-up with his primary care physician to see if he can change the Plavix.  Patient will have extreme difficulty trying to heal the wounds in the right leg with the wound healing problems secondary to Plavix.  He will continue with the compression wraps at home.  Patient will need a deeper wheelchair in order for him to safely use the power chair.  Follow-Up Instructions: Return in about 4 weeks (around 07/27/2022).   Ortho Exam  Patient is alert, oriented, no adenopathy, well-dressed, normal affect, normal respiratory effort. Examination patient has bleeding from the venous ulcers right leg with brawny skin color changes.  No signs of infection and the ulcers are all about 5 mm in diameter.  He has a good dorsalis pedis pulse no arterial insufficiency.  Patient is currently too large for his current power chair and will need a deeper seat patient is strapped into the wheelchair to prevent him from falling out.  Imaging: No results found. No images are attached to the encounter.  Labs: Lab Results  Component Value Date   HGBA1C 7.3 (H) 10/17/2021   HGBA1C 6.5 (H)  09/12/2019   CRP 17.6 (H) 09/14/2019   CRP 14.7 (H) 09/13/2019   CRP 14.6 (H) 09/12/2019   REPTSTATUS 11/20/2021 FINAL 11/17/2021   CULT (A) 11/17/2021    >=100,000 COLONIES/mL ESCHERICHIA COLI >=100,000 COLONIES/mL KLEBSIELLA OXYTOCA    LABORGA ESCHERICHIA COLI (A) 11/17/2021   LABORGA KLEBSIELLA OXYTOCA (A) 11/17/2021     Lab Results  Component Value Date   ALBUMIN 2.6 (L) 11/10/2021   ALBUMIN 1.8 (L) 10/25/2021   ALBUMIN 2.5 (L) 10/14/2021    Lab Results  Component Value Date   MG 2.1 09/14/2019   MG 2.3 09/13/2019   MG 1.9 09/12/2019   No results found for: "VD25OH"  No results found for: "PREALBUMIN"    Latest Ref Rng & Units 12/21/2021    9:53 AM 11/10/2021    6:54 AM 10/31/2021    5:00 AM  CBC EXTENDED  WBC 4.0 - 10.5 K/uL 7.6  6.5  7.9   RBC 4.22 - 5.81 Mil/uL 4.57  3.88  3.22   Hemoglobin 13.0 - 17.0 g/dL 11.4  9.5  7.8   HCT 39.0 - 52.0 % 35.6  33.0  26.5   Platelets 150.0 - 400.0 K/uL 230.0  176  243   NEUT# 1.4 - 7.7 K/uL 5.3  4.3    Lymph# 0.7 - 4.0  K/uL 0.9  0.8       There is no height or weight on file to calculate BMI.  Orders:  No orders of the defined types were placed in this encounter.  No orders of the defined types were placed in this encounter.    Procedures: No procedures performed  Clinical Data: No additional findings.  ROS:  All other systems negative, except as noted in the HPI. Review of Systems  Objective: Vital Signs: There were no vitals taken for this visit.  Specialty Comments:  No specialty comments available.  PMFS History: Patient Active Problem List   Diagnosis Date Noted   Acute on chronic diastolic (congestive) heart failure (HCC)    Controlled type 2 diabetes mellitus with hyperglycemia, without long-term current use of insulin (HCC)    Phantom limb pain (HCC)    Left below-knee amputee ([place]) 10/25/2021   Left leg pain    Osteomyelitis of ankle and foot ([place]) 10/15/2021   Anemia 10/15/2021    Pressure ulcer 10/15/2021   History of CVA (cerebrovascular accident) 10/15/2021   Septic arthritis of ankle and foot region ([place]) 10/15/2021   AKI (acute kidney injury) ([place]) 10/14/2021   Pneumonia due to COVID-19 virus 09/09/2019   Hypokalemia 09/09/2019   Acute respiratory failure with hypoxia ([place]) 09/09/2019   Essential hypertension 10/14/2014   Chronic diastolic heart failure ([place]) 10/14/2014   Morbid obesity ([place]) 07/22/2013   Varicose veins of lower extremities with other complications 53/97/6734   OSA (obstructive sleep apnea) 07/22/2013   Venous stasis ulcers ([place]) 07/22/2013   Diabetes mellitus ([place]) 01/03/2011   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[place])    Diverticulosis    Essential hypertension    Gastritis    History of nephrolithiasis    History of open leg wound    History of stroke 03/19/2003   Lymphedema    Obstructive sleep apnea    Peptic ulcer    TIA (transient ischemic attack)    Varicose veins    Laser ablation of left great saphenous vein 2009    Family History  Problem Relation Age of Onset   Bone cancer Mother    Hypertension Father    Pancreatic cancer Maternal Grandmother    Stroke Paternal Grandmother    Diabetes Paternal Grandfather    Heart attack Brother    Hypertension Brother    Heart attack Sister     Past Surgical History:  Procedure Laterality Date   AMPUTATION Left 10/21/2021   Procedure: LEFT BELOW KNEE AMPUTATION;  Surgeon: [name], MD;  Location: [facility];  Service: Orthopedics;  Laterality: Left;   BIOPSY  10/19/2021   Procedure: BIOPSY;  Surgeon: Sharyn Creamer, MD;  Location: [facility];  Service: Gastroenterology;;   COLONOSCOPY N/A 10/19/2021   Procedure: COLONOSCOPY;  Surgeon: Sharyn Creamer, MD;  Location: [facility];  Service: Gastroenterology;  Laterality: N/A;   CYSTOSCOPY WITH RETROGRADE PYELOGRAM, URETEROSCOPY AND STENT PLACEMENT Right 08/02/2015   Procedure:  CYSTOSCOPY WITH RETROGRADE PYELOGRAM, URETEROSCOPY , STONE EXTRACTION AND STENT PLACEMENT;  Surgeon: Cleon Gustin, MD;  [facility];  Service: Urology;  Laterality: Right;   ESOPHAGOGASTRODUODENOSCOPY (EGD) WITH PROPOFOL N/A 10/19/2021   Procedure: ESOPHAGOGASTRODUODENOSCOPY (EGD) WITH PROPOFOL;  Surgeon: Sharyn Creamer, MD;  Location: [facility];  Service: Gastroenterology;  Laterality: N/A;   HOLMIUM LASER APPLICATION Right 19/37/9024   Procedure: HOLMIUM LASER APPLICATION;  Surgeon: Cleon Gustin, MD;  [facility];  Service: Urology;  Laterality: Right;   KIDNEY STONE SURGERY     POLYPECTOMY  10/19/2021   Procedure: POLYPECTOMY;  Surgeon: Sharyn Creamer, MD;  Location: [facility];  Service: Gastroenterology;;   [place] Left 06/2010   Laser ablation   Social History   Occupational History   Occupation: controll room operator     Employer: [employer]  Tobacco Use   Smoking status: Never    Passive exposure: Never   Smokeless tobacco: Never  Substance and Sexual Activity   Alcohol use: No    Alcohol/week: 0.0 standard drinks of alcohol   Drug use: No   Sexual activity: Not on file

## 2022-07-04 ENCOUNTER — Telehealth: Payer: Self-pay | Admitting: Cardiology

## 2022-07-04 DIAGNOSIS — L97812 Non-pressure chronic ulcer of other part of right lower leg with fat layer exposed: Secondary | ICD-10-CM | POA: Diagnosis not present

## 2022-07-04 DIAGNOSIS — I5030 Unspecified diastolic (congestive) heart failure: Secondary | ICD-10-CM | POA: Diagnosis not present

## 2022-07-04 DIAGNOSIS — I872 Venous insufficiency (chronic) (peripheral): Secondary | ICD-10-CM | POA: Diagnosis not present

## 2022-07-04 DIAGNOSIS — E119 Type 2 diabetes mellitus without complications: Secondary | ICD-10-CM | POA: Diagnosis not present

## 2022-07-04 DIAGNOSIS — L97122 Non-pressure chronic ulcer of left thigh with fat layer exposed: Secondary | ICD-10-CM | POA: Diagnosis not present

## 2022-07-04 DIAGNOSIS — I110 Hypertensive heart disease with heart failure: Secondary | ICD-10-CM | POA: Diagnosis not present

## 2022-07-04 NOTE — Telephone Encounter (Signed)
Patient called and mentioned that surgeon that did his amputation wants to take him off of Plavix. Would like to talk with Dr. Domenic Polite or nurse about it

## 2022-07-04 NOTE — Telephone Encounter (Signed)
Reviewed Duda's note who suggested patient reach out to his PCP to see if plavix can be held to promote healing of right LE. (Patient will follow-up with his primary care physician to see if he can change the Plavix.  Patient will have extreme difficulty trying to heal the wounds in the right leg with the wound healing problems secondary to Plavix.) Advised that plavix has not been prescribed or refilled by Domenic Polite and he should reach out to his neurologist or family doctor with this request. Says he has already been advised by his PCP Quintin [place]) and was told that he didn't feel comfortable stopping plavix. Says he has not seen a neurologist for 20 years and has an upcoming appointment at [facility] in the next month. Says he wanted [facility]'s opinion regarding holding plavix. Advised message would be sent to provider.

## 2022-07-04 NOTE — Telephone Encounter (Signed)
Patient informed and verbalized understanding of plan. 

## 2022-07-05 DIAGNOSIS — I5030 Unspecified diastolic (congestive) heart failure: Secondary | ICD-10-CM | POA: Diagnosis not present

## 2022-07-05 DIAGNOSIS — E119 Type 2 diabetes mellitus without complications: Secondary | ICD-10-CM | POA: Diagnosis not present

## 2022-07-05 DIAGNOSIS — L97812 Non-pressure chronic ulcer of other part of right lower leg with fat layer exposed: Secondary | ICD-10-CM | POA: Diagnosis not present

## 2022-07-05 DIAGNOSIS — I110 Hypertensive heart disease with heart failure: Secondary | ICD-10-CM | POA: Diagnosis not present

## 2022-07-05 DIAGNOSIS — L97122 Non-pressure chronic ulcer of left thigh with fat layer exposed: Secondary | ICD-10-CM | POA: Diagnosis not present

## 2022-07-05 DIAGNOSIS — I872 Venous insufficiency (chronic) (peripheral): Secondary | ICD-10-CM | POA: Diagnosis not present

## 2022-07-07 DIAGNOSIS — L97122 Non-pressure chronic ulcer of left thigh with fat layer exposed: Secondary | ICD-10-CM | POA: Diagnosis not present

## 2022-07-07 DIAGNOSIS — L97812 Non-pressure chronic ulcer of other part of right lower leg with fat layer exposed: Secondary | ICD-10-CM | POA: Diagnosis not present

## 2022-07-07 DIAGNOSIS — I110 Hypertensive heart disease with heart failure: Secondary | ICD-10-CM | POA: Diagnosis not present

## 2022-07-07 DIAGNOSIS — E119 Type 2 diabetes mellitus without complications: Secondary | ICD-10-CM | POA: Diagnosis not present

## 2022-07-07 DIAGNOSIS — I872 Venous insufficiency (chronic) (peripheral): Secondary | ICD-10-CM | POA: Diagnosis not present

## 2022-07-07 DIAGNOSIS — I5030 Unspecified diastolic (congestive) heart failure: Secondary | ICD-10-CM | POA: Diagnosis not present

## 2022-07-11 DIAGNOSIS — L97122 Non-pressure chronic ulcer of left thigh with fat layer exposed: Secondary | ICD-10-CM | POA: Diagnosis not present

## 2022-07-11 DIAGNOSIS — I872 Venous insufficiency (chronic) (peripheral): Secondary | ICD-10-CM | POA: Diagnosis not present

## 2022-07-11 DIAGNOSIS — I110 Hypertensive heart disease with heart failure: Secondary | ICD-10-CM | POA: Diagnosis not present

## 2022-07-11 DIAGNOSIS — I5030 Unspecified diastolic (congestive) heart failure: Secondary | ICD-10-CM | POA: Diagnosis not present

## 2022-07-11 DIAGNOSIS — L97812 Non-pressure chronic ulcer of other part of right lower leg with fat layer exposed: Secondary | ICD-10-CM | POA: Diagnosis not present

## 2022-07-11 DIAGNOSIS — E119 Type 2 diabetes mellitus without complications: Secondary | ICD-10-CM | POA: Diagnosis not present

## 2022-07-13 DIAGNOSIS — E119 Type 2 diabetes mellitus without complications: Secondary | ICD-10-CM | POA: Diagnosis not present

## 2022-07-13 DIAGNOSIS — L97812 Non-pressure chronic ulcer of other part of right lower leg with fat layer exposed: Secondary | ICD-10-CM | POA: Diagnosis not present

## 2022-07-13 DIAGNOSIS — I110 Hypertensive heart disease with heart failure: Secondary | ICD-10-CM | POA: Diagnosis not present

## 2022-07-13 DIAGNOSIS — I5030 Unspecified diastolic (congestive) heart failure: Secondary | ICD-10-CM | POA: Diagnosis not present

## 2022-07-13 DIAGNOSIS — L97122 Non-pressure chronic ulcer of left thigh with fat layer exposed: Secondary | ICD-10-CM | POA: Diagnosis not present

## 2022-07-13 DIAGNOSIS — I872 Venous insufficiency (chronic) (peripheral): Secondary | ICD-10-CM | POA: Diagnosis not present

## 2022-07-14 DIAGNOSIS — L97122 Non-pressure chronic ulcer of left thigh with fat layer exposed: Secondary | ICD-10-CM | POA: Diagnosis not present

## 2022-07-14 DIAGNOSIS — E119 Type 2 diabetes mellitus without complications: Secondary | ICD-10-CM | POA: Diagnosis not present

## 2022-07-14 DIAGNOSIS — I110 Hypertensive heart disease with heart failure: Secondary | ICD-10-CM | POA: Diagnosis not present

## 2022-07-14 DIAGNOSIS — I5030 Unspecified diastolic (congestive) heart failure: Secondary | ICD-10-CM | POA: Diagnosis not present

## 2022-07-14 DIAGNOSIS — L97812 Non-pressure chronic ulcer of other part of right lower leg with fat layer exposed: Secondary | ICD-10-CM | POA: Diagnosis not present

## 2022-07-14 DIAGNOSIS — I872 Venous insufficiency (chronic) (peripheral): Secondary | ICD-10-CM | POA: Diagnosis not present

## 2022-07-15 DIAGNOSIS — I110 Hypertensive heart disease with heart failure: Secondary | ICD-10-CM | POA: Diagnosis not present

## 2022-07-15 DIAGNOSIS — E7800 Pure hypercholesterolemia, unspecified: Secondary | ICD-10-CM | POA: Diagnosis not present

## 2022-07-15 DIAGNOSIS — L97812 Non-pressure chronic ulcer of other part of right lower leg with fat layer exposed: Secondary | ICD-10-CM | POA: Diagnosis not present

## 2022-07-15 DIAGNOSIS — N200 Calculus of kidney: Secondary | ICD-10-CM | POA: Diagnosis not present

## 2022-07-15 DIAGNOSIS — E119 Type 2 diabetes mellitus without complications: Secondary | ICD-10-CM | POA: Diagnosis not present

## 2022-07-15 DIAGNOSIS — Z794 Long term (current) use of insulin: Secondary | ICD-10-CM | POA: Diagnosis not present

## 2022-07-15 DIAGNOSIS — D649 Anemia, unspecified: Secondary | ICD-10-CM | POA: Diagnosis not present

## 2022-07-15 DIAGNOSIS — I872 Venous insufficiency (chronic) (peripheral): Secondary | ICD-10-CM | POA: Diagnosis not present

## 2022-07-15 DIAGNOSIS — L97122 Non-pressure chronic ulcer of left thigh with fat layer exposed: Secondary | ICD-10-CM | POA: Diagnosis not present

## 2022-07-15 DIAGNOSIS — Z89512 Acquired absence of left leg below knee: Secondary | ICD-10-CM | POA: Diagnosis not present

## 2022-07-15 DIAGNOSIS — Z7982 Long term (current) use of aspirin: Secondary | ICD-10-CM | POA: Diagnosis not present

## 2022-07-15 DIAGNOSIS — Z7902 Long term (current) use of antithrombotics/antiplatelets: Secondary | ICD-10-CM | POA: Diagnosis not present

## 2022-07-15 DIAGNOSIS — Z8673 Personal history of transient ischemic attack (TIA), and cerebral infarction without residual deficits: Secondary | ICD-10-CM | POA: Diagnosis not present

## 2022-07-15 DIAGNOSIS — E669 Obesity, unspecified: Secondary | ICD-10-CM | POA: Diagnosis not present

## 2022-07-15 DIAGNOSIS — I5030 Unspecified diastolic (congestive) heart failure: Secondary | ICD-10-CM | POA: Diagnosis not present

## 2022-07-17 DIAGNOSIS — L97122 Non-pressure chronic ulcer of left thigh with fat layer exposed: Secondary | ICD-10-CM | POA: Diagnosis not present

## 2022-07-17 DIAGNOSIS — I110 Hypertensive heart disease with heart failure: Secondary | ICD-10-CM | POA: Diagnosis not present

## 2022-07-17 DIAGNOSIS — I5030 Unspecified diastolic (congestive) heart failure: Secondary | ICD-10-CM | POA: Diagnosis not present

## 2022-07-17 DIAGNOSIS — L97812 Non-pressure chronic ulcer of other part of right lower leg with fat layer exposed: Secondary | ICD-10-CM | POA: Diagnosis not present

## 2022-07-17 DIAGNOSIS — I872 Venous insufficiency (chronic) (peripheral): Secondary | ICD-10-CM | POA: Diagnosis not present

## 2022-07-17 DIAGNOSIS — E119 Type 2 diabetes mellitus without complications: Secondary | ICD-10-CM | POA: Diagnosis not present

## 2022-07-20 DIAGNOSIS — I5030 Unspecified diastolic (congestive) heart failure: Secondary | ICD-10-CM | POA: Diagnosis not present

## 2022-07-20 DIAGNOSIS — E119 Type 2 diabetes mellitus without complications: Secondary | ICD-10-CM | POA: Diagnosis not present

## 2022-07-20 DIAGNOSIS — L97812 Non-pressure chronic ulcer of other part of right lower leg with fat layer exposed: Secondary | ICD-10-CM | POA: Diagnosis not present

## 2022-07-20 DIAGNOSIS — I110 Hypertensive heart disease with heart failure: Secondary | ICD-10-CM | POA: Diagnosis not present

## 2022-07-20 DIAGNOSIS — I872 Venous insufficiency (chronic) (peripheral): Secondary | ICD-10-CM | POA: Diagnosis not present

## 2022-07-20 DIAGNOSIS — L97122 Non-pressure chronic ulcer of left thigh with fat layer exposed: Secondary | ICD-10-CM | POA: Diagnosis not present

## 2022-07-21 DIAGNOSIS — L97812 Non-pressure chronic ulcer of other part of right lower leg with fat layer exposed: Secondary | ICD-10-CM | POA: Diagnosis not present

## 2022-07-21 DIAGNOSIS — L97122 Non-pressure chronic ulcer of left thigh with fat layer exposed: Secondary | ICD-10-CM | POA: Diagnosis not present

## 2022-07-21 DIAGNOSIS — E119 Type 2 diabetes mellitus without complications: Secondary | ICD-10-CM | POA: Diagnosis not present

## 2022-07-21 DIAGNOSIS — I872 Venous insufficiency (chronic) (peripheral): Secondary | ICD-10-CM | POA: Diagnosis not present

## 2022-07-21 DIAGNOSIS — I5030 Unspecified diastolic (congestive) heart failure: Secondary | ICD-10-CM | POA: Diagnosis not present

## 2022-07-21 DIAGNOSIS — I110 Hypertensive heart disease with heart failure: Secondary | ICD-10-CM | POA: Diagnosis not present

## 2022-07-25 DIAGNOSIS — L97812 Non-pressure chronic ulcer of other part of right lower leg with fat layer exposed: Secondary | ICD-10-CM | POA: Diagnosis not present

## 2022-07-25 DIAGNOSIS — I5030 Unspecified diastolic (congestive) heart failure: Secondary | ICD-10-CM | POA: Diagnosis not present

## 2022-07-25 DIAGNOSIS — I110 Hypertensive heart disease with heart failure: Secondary | ICD-10-CM | POA: Diagnosis not present

## 2022-07-25 DIAGNOSIS — E119 Type 2 diabetes mellitus without complications: Secondary | ICD-10-CM | POA: Diagnosis not present

## 2022-07-25 DIAGNOSIS — L97122 Non-pressure chronic ulcer of left thigh with fat layer exposed: Secondary | ICD-10-CM | POA: Diagnosis not present

## 2022-07-25 DIAGNOSIS — I872 Venous insufficiency (chronic) (peripheral): Secondary | ICD-10-CM | POA: Diagnosis not present

## 2022-07-26 DIAGNOSIS — E119 Type 2 diabetes mellitus without complications: Secondary | ICD-10-CM | POA: Diagnosis not present

## 2022-07-26 DIAGNOSIS — L97812 Non-pressure chronic ulcer of other part of right lower leg with fat layer exposed: Secondary | ICD-10-CM | POA: Diagnosis not present

## 2022-07-26 DIAGNOSIS — I872 Venous insufficiency (chronic) (peripheral): Secondary | ICD-10-CM | POA: Diagnosis not present

## 2022-07-26 DIAGNOSIS — L97122 Non-pressure chronic ulcer of left thigh with fat layer exposed: Secondary | ICD-10-CM | POA: Diagnosis not present

## 2022-07-26 DIAGNOSIS — I5030 Unspecified diastolic (congestive) heart failure: Secondary | ICD-10-CM | POA: Diagnosis not present

## 2022-07-26 DIAGNOSIS — I110 Hypertensive heart disease with heart failure: Secondary | ICD-10-CM | POA: Diagnosis not present

## 2022-07-27 ENCOUNTER — Ambulatory Visit (INDEPENDENT_AMBULATORY_CARE_PROVIDER_SITE_OTHER): Payer: Medicare Other | Admitting: Orthopedic Surgery

## 2022-07-27 DIAGNOSIS — L97919 Non-pressure chronic ulcer of unspecified part of right lower leg with unspecified severity: Secondary | ICD-10-CM

## 2022-07-27 DIAGNOSIS — I83019 Varicose veins of right lower extremity with ulcer of unspecified site: Secondary | ICD-10-CM | POA: Diagnosis not present

## 2022-07-27 DIAGNOSIS — Z89512 Acquired absence of left leg below knee: Secondary | ICD-10-CM | POA: Diagnosis not present

## 2022-07-28 DIAGNOSIS — L97812 Non-pressure chronic ulcer of other part of right lower leg with fat layer exposed: Secondary | ICD-10-CM | POA: Diagnosis not present

## 2022-07-28 DIAGNOSIS — I110 Hypertensive heart disease with heart failure: Secondary | ICD-10-CM | POA: Diagnosis not present

## 2022-07-28 DIAGNOSIS — E119 Type 2 diabetes mellitus without complications: Secondary | ICD-10-CM | POA: Diagnosis not present

## 2022-07-28 DIAGNOSIS — I872 Venous insufficiency (chronic) (peripheral): Secondary | ICD-10-CM | POA: Diagnosis not present

## 2022-07-28 DIAGNOSIS — I5030 Unspecified diastolic (congestive) heart failure: Secondary | ICD-10-CM | POA: Diagnosis not present

## 2022-07-28 DIAGNOSIS — L97122 Non-pressure chronic ulcer of left thigh with fat layer exposed: Secondary | ICD-10-CM | POA: Diagnosis not present

## 2022-07-31 DIAGNOSIS — I110 Hypertensive heart disease with heart failure: Secondary | ICD-10-CM | POA: Diagnosis not present

## 2022-07-31 DIAGNOSIS — L97812 Non-pressure chronic ulcer of other part of right lower leg with fat layer exposed: Secondary | ICD-10-CM | POA: Diagnosis not present

## 2022-07-31 DIAGNOSIS — L97122 Non-pressure chronic ulcer of left thigh with fat layer exposed: Secondary | ICD-10-CM | POA: Diagnosis not present

## 2022-07-31 DIAGNOSIS — E7800 Pure hypercholesterolemia, unspecified: Secondary | ICD-10-CM | POA: Diagnosis not present

## 2022-07-31 DIAGNOSIS — I872 Venous insufficiency (chronic) (peripheral): Secondary | ICD-10-CM | POA: Diagnosis not present

## 2022-07-31 DIAGNOSIS — I5030 Unspecified diastolic (congestive) heart failure: Secondary | ICD-10-CM | POA: Diagnosis not present

## 2022-07-31 DIAGNOSIS — D649 Anemia, unspecified: Secondary | ICD-10-CM | POA: Diagnosis not present

## 2022-07-31 DIAGNOSIS — E119 Type 2 diabetes mellitus without complications: Secondary | ICD-10-CM | POA: Diagnosis not present

## 2022-08-01 DIAGNOSIS — L97122 Non-pressure chronic ulcer of left thigh with fat layer exposed: Secondary | ICD-10-CM | POA: Diagnosis not present

## 2022-08-01 DIAGNOSIS — L97812 Non-pressure chronic ulcer of other part of right lower leg with fat layer exposed: Secondary | ICD-10-CM | POA: Diagnosis not present

## 2022-08-01 DIAGNOSIS — E119 Type 2 diabetes mellitus without complications: Secondary | ICD-10-CM | POA: Diagnosis not present

## 2022-08-01 DIAGNOSIS — I5030 Unspecified diastolic (congestive) heart failure: Secondary | ICD-10-CM | POA: Diagnosis not present

## 2022-08-01 DIAGNOSIS — I872 Venous insufficiency (chronic) (peripheral): Secondary | ICD-10-CM | POA: Diagnosis not present

## 2022-08-01 DIAGNOSIS — I110 Hypertensive heart disease with heart failure: Secondary | ICD-10-CM | POA: Diagnosis not present

## 2022-08-03 DIAGNOSIS — L97122 Non-pressure chronic ulcer of left thigh with fat layer exposed: Secondary | ICD-10-CM | POA: Diagnosis not present

## 2022-08-03 DIAGNOSIS — E119 Type 2 diabetes mellitus without complications: Secondary | ICD-10-CM | POA: Diagnosis not present

## 2022-08-03 DIAGNOSIS — I110 Hypertensive heart disease with heart failure: Secondary | ICD-10-CM | POA: Diagnosis not present

## 2022-08-03 DIAGNOSIS — I872 Venous insufficiency (chronic) (peripheral): Secondary | ICD-10-CM | POA: Diagnosis not present

## 2022-08-03 DIAGNOSIS — L97812 Non-pressure chronic ulcer of other part of right lower leg with fat layer exposed: Secondary | ICD-10-CM | POA: Diagnosis not present

## 2022-08-03 DIAGNOSIS — I5030 Unspecified diastolic (congestive) heart failure: Secondary | ICD-10-CM | POA: Diagnosis not present

## 2022-08-04 DIAGNOSIS — I5030 Unspecified diastolic (congestive) heart failure: Secondary | ICD-10-CM | POA: Diagnosis not present

## 2022-08-04 DIAGNOSIS — E119 Type 2 diabetes mellitus without complications: Secondary | ICD-10-CM | POA: Diagnosis not present

## 2022-08-04 DIAGNOSIS — I110 Hypertensive heart disease with heart failure: Secondary | ICD-10-CM | POA: Diagnosis not present

## 2022-08-04 DIAGNOSIS — L97122 Non-pressure chronic ulcer of left thigh with fat layer exposed: Secondary | ICD-10-CM | POA: Diagnosis not present

## 2022-08-04 DIAGNOSIS — L97812 Non-pressure chronic ulcer of other part of right lower leg with fat layer exposed: Secondary | ICD-10-CM | POA: Diagnosis not present

## 2022-08-04 DIAGNOSIS — I872 Venous insufficiency (chronic) (peripheral): Secondary | ICD-10-CM | POA: Diagnosis not present

## 2022-08-09 DIAGNOSIS — I872 Venous insufficiency (chronic) (peripheral): Secondary | ICD-10-CM | POA: Diagnosis not present

## 2022-08-09 DIAGNOSIS — I5030 Unspecified diastolic (congestive) heart failure: Secondary | ICD-10-CM | POA: Diagnosis not present

## 2022-08-09 DIAGNOSIS — E119 Type 2 diabetes mellitus without complications: Secondary | ICD-10-CM | POA: Diagnosis not present

## 2022-08-09 DIAGNOSIS — L97812 Non-pressure chronic ulcer of other part of right lower leg with fat layer exposed: Secondary | ICD-10-CM | POA: Diagnosis not present

## 2022-08-09 DIAGNOSIS — I110 Hypertensive heart disease with heart failure: Secondary | ICD-10-CM | POA: Diagnosis not present

## 2022-08-09 DIAGNOSIS — L97122 Non-pressure chronic ulcer of left thigh with fat layer exposed: Secondary | ICD-10-CM | POA: Diagnosis not present

## 2022-08-11 DIAGNOSIS — L97812 Non-pressure chronic ulcer of other part of right lower leg with fat layer exposed: Secondary | ICD-10-CM | POA: Diagnosis not present

## 2022-08-11 DIAGNOSIS — L97122 Non-pressure chronic ulcer of left thigh with fat layer exposed: Secondary | ICD-10-CM | POA: Diagnosis not present

## 2022-08-11 DIAGNOSIS — I5030 Unspecified diastolic (congestive) heart failure: Secondary | ICD-10-CM | POA: Diagnosis not present

## 2022-08-11 DIAGNOSIS — I110 Hypertensive heart disease with heart failure: Secondary | ICD-10-CM | POA: Diagnosis not present

## 2022-08-11 DIAGNOSIS — E119 Type 2 diabetes mellitus without complications: Secondary | ICD-10-CM | POA: Diagnosis not present

## 2022-08-11 DIAGNOSIS — I872 Venous insufficiency (chronic) (peripheral): Secondary | ICD-10-CM | POA: Diagnosis not present

## 2022-08-12 DIAGNOSIS — I872 Venous insufficiency (chronic) (peripheral): Secondary | ICD-10-CM | POA: Diagnosis not present

## 2022-08-12 DIAGNOSIS — I5030 Unspecified diastolic (congestive) heart failure: Secondary | ICD-10-CM | POA: Diagnosis not present

## 2022-08-12 DIAGNOSIS — I110 Hypertensive heart disease with heart failure: Secondary | ICD-10-CM | POA: Diagnosis not present

## 2022-08-12 DIAGNOSIS — E119 Type 2 diabetes mellitus without complications: Secondary | ICD-10-CM | POA: Diagnosis not present

## 2022-08-12 DIAGNOSIS — L97812 Non-pressure chronic ulcer of other part of right lower leg with fat layer exposed: Secondary | ICD-10-CM | POA: Diagnosis not present

## 2022-08-12 DIAGNOSIS — L97122 Non-pressure chronic ulcer of left thigh with fat layer exposed: Secondary | ICD-10-CM | POA: Diagnosis not present

## 2022-08-14 DIAGNOSIS — E7800 Pure hypercholesterolemia, unspecified: Secondary | ICD-10-CM | POA: Diagnosis not present

## 2022-08-14 DIAGNOSIS — Z89512 Acquired absence of left leg below knee: Secondary | ICD-10-CM | POA: Diagnosis not present

## 2022-08-14 DIAGNOSIS — N200 Calculus of kidney: Secondary | ICD-10-CM | POA: Diagnosis not present

## 2022-08-14 DIAGNOSIS — Z7982 Long term (current) use of aspirin: Secondary | ICD-10-CM | POA: Diagnosis not present

## 2022-08-14 DIAGNOSIS — Z794 Long term (current) use of insulin: Secondary | ICD-10-CM | POA: Diagnosis not present

## 2022-08-14 DIAGNOSIS — L97812 Non-pressure chronic ulcer of other part of right lower leg with fat layer exposed: Secondary | ICD-10-CM | POA: Diagnosis not present

## 2022-08-14 DIAGNOSIS — Z8673 Personal history of transient ischemic attack (TIA), and cerebral infarction without residual deficits: Secondary | ICD-10-CM | POA: Diagnosis not present

## 2022-08-14 DIAGNOSIS — L97122 Non-pressure chronic ulcer of left thigh with fat layer exposed: Secondary | ICD-10-CM | POA: Diagnosis not present

## 2022-08-14 DIAGNOSIS — E119 Type 2 diabetes mellitus without complications: Secondary | ICD-10-CM | POA: Diagnosis not present

## 2022-08-14 DIAGNOSIS — E669 Obesity, unspecified: Secondary | ICD-10-CM | POA: Diagnosis not present

## 2022-08-14 DIAGNOSIS — I110 Hypertensive heart disease with heart failure: Secondary | ICD-10-CM | POA: Diagnosis not present

## 2022-08-14 DIAGNOSIS — Z7902 Long term (current) use of antithrombotics/antiplatelets: Secondary | ICD-10-CM | POA: Diagnosis not present

## 2022-08-14 DIAGNOSIS — D649 Anemia, unspecified: Secondary | ICD-10-CM | POA: Diagnosis not present

## 2022-08-14 DIAGNOSIS — I872 Venous insufficiency (chronic) (peripheral): Secondary | ICD-10-CM | POA: Diagnosis not present

## 2022-08-14 DIAGNOSIS — I5030 Unspecified diastolic (congestive) heart failure: Secondary | ICD-10-CM | POA: Diagnosis not present

## 2022-08-15 ENCOUNTER — Encounter: Payer: Self-pay | Admitting: Orthopedic Surgery

## 2022-08-15 DIAGNOSIS — I872 Venous insufficiency (chronic) (peripheral): Secondary | ICD-10-CM | POA: Diagnosis not present

## 2022-08-15 DIAGNOSIS — L97812 Non-pressure chronic ulcer of other part of right lower leg with fat layer exposed: Secondary | ICD-10-CM | POA: Diagnosis not present

## 2022-08-15 DIAGNOSIS — E119 Type 2 diabetes mellitus without complications: Secondary | ICD-10-CM | POA: Diagnosis not present

## 2022-08-15 DIAGNOSIS — I5030 Unspecified diastolic (congestive) heart failure: Secondary | ICD-10-CM | POA: Diagnosis not present

## 2022-08-15 DIAGNOSIS — L97122 Non-pressure chronic ulcer of left thigh with fat layer exposed: Secondary | ICD-10-CM | POA: Diagnosis not present

## 2022-08-15 DIAGNOSIS — I110 Hypertensive heart disease with heart failure: Secondary | ICD-10-CM | POA: Diagnosis not present

## 2022-08-15 NOTE — Progress Notes (Signed)
Office Visit Note   Patient: [name] [name]           Date of Birth: 12-Jun-1956           MRN: [id number] Visit Date: 07/27/2022              Requested by: Sasser, Silvestre Moment, MD [place],  [place] [address] PCP: Manon Hilding, MD  Chief Complaint  Patient presents with   Right Leg - Follow-up   Left Leg - Follow-up    Hx BKA 10/21/2021      HPI: Patient is 9 months status post left transtibial amputation patient has right lower extremity venous insufficiency currently in a [place] wrap.  Assessment & Plan: Visit Diagnoses:  1. Left below-knee amputee [facility])     Plan: Patient will discussed with his neurologist possibility of changing his blood thinner due to increased bleeding from the wound.  Patient will continue with home health dressing changes 2 times a week.  Follow-Up Instructions: Return in about 4 weeks (around 08/24/2022).   Ortho Exam  Patient is alert, oriented, no adenopathy, well-dressed, normal affect, normal respiratory effort. Examination the venous ulcer is improving.  There is improved granulation tissue no cellulitis no odor no drainage no exposed bone or tendon.  Compression wrap was reapplied.  Imaging: No results found. No images are attached to the encounter.  Labs: Lab Results  Component Value Date   HGBA1C 7.3 (H) 10/17/2021   HGBA1C 6.5 (H) 09/12/2019   CRP 17.6 (H) 09/14/2019   CRP 14.7 (H) 09/13/2019   CRP 14.6 (H) 09/12/2019   REPTSTATUS 11/20/2021 FINAL 11/17/2021   CULT (A) 11/17/2021    >=100,000 COLONIES/mL ESCHERICHIA COLI >=100,000 COLONIES/mL KLEBSIELLA OXYTOCA    LABORGA ESCHERICHIA COLI (A) 11/17/2021   LABORGA KLEBSIELLA OXYTOCA (A) 11/17/2021     Lab Results  Component Value Date   ALBUMIN 2.6 (L) 11/10/2021   ALBUMIN 1.8 (L) 10/25/2021   ALBUMIN 2.5 (L) 10/14/2021    Lab Results  Component Value Date   MG 2.1 09/14/2019   MG 2.3 09/13/2019   MG 1.9 09/12/2019   No results found for: "VD25OH"  No  results found for: "PREALBUMIN"    Latest Ref Rng & Units 12/21/2021    9:53 AM 11/10/2021    6:54 AM 10/31/2021    5:00 AM  CBC EXTENDED  WBC 4.0 - 10.5 K/uL 7.6  6.5  7.9   RBC 4.22 - 5.81 Mil/uL 4.57  3.88  3.22   Hemoglobin 13.0 - 17.0 g/dL 11.4  9.5  7.8   HCT 39.0 - 52.0 % 35.6  33.0  26.5   Platelets 150.0 - 400.0 K/uL 230.0  176  243   NEUT# 1.4 - 7.7 K/uL 5.3  4.3    Lymph# 0.7 - 4.0 K/uL 0.9  0.8       There is no height or weight on file to calculate BMI.  Orders:  No orders of the defined types were placed in this encounter.  No orders of the defined types were placed in this encounter.    Procedures: No procedures performed  Clinical Data: No additional findings.  ROS:  All other systems negative, except as noted in the HPI. Review of Systems  Objective: Vital Signs: There were no vitals taken for this visit.  Specialty Comments:  No specialty comments available.  PMFS History: Patient Active Problem List   Diagnosis Date Noted   Acute on chronic diastolic (congestive)  heart failure ([place])    Controlled type 2 diabetes mellitus with hyperglycemia, without long-term current use of insulin (HCC)    Phantom limb pain (HCC)    Left below-knee amputee ([place]) 10/25/2021   Left leg pain    Osteomyelitis of ankle and foot ([place]) 10/15/2021   Anemia 10/15/2021   Pressure ulcer 10/15/2021   History of CVA (cerebrovascular accident) 10/15/2021   Septic arthritis of ankle and foot region ([place]) 10/15/2021   AKI (acute kidney injury) ([place]) 10/14/2021   Pneumonia due to COVID-19 virus 09/09/2019   Hypokalemia 09/09/2019   Acute respiratory failure with hypoxia ([place]) 09/09/2019   Essential hypertension 10/14/2014   Chronic diastolic heart failure ([place]) 10/14/2014   Morbid obesity ([place]) 07/22/2013   Varicose veins of lower extremities with other complications 20/94/7096   OSA (obstructive sleep apnea) 07/22/2013   Venous stasis ulcers ([place]) 07/22/2013   Diabetes  mellitus ([place]) 01/03/2011   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[place])    Diverticulosis    Essential hypertension    Gastritis    History of nephrolithiasis    History of open leg wound    History of stroke 03/19/2003   Lymphedema    Obstructive sleep apnea    Peptic ulcer    TIA (transient ischemic attack)    Varicose veins    Laser ablation of left great saphenous vein 2009    Family History  Problem Relation Age of Onset   Bone cancer Mother    Hypertension Father    Pancreatic cancer Maternal Grandmother    Stroke Paternal Grandmother    Diabetes Paternal Grandfather    Heart attack Brother    Hypertension Brother    Heart attack Sister     Past Surgical History:  Procedure Laterality Date   AMPUTATION Left 10/21/2021   Procedure: LEFT BELOW KNEE AMPUTATION;  Surgeon: [name], MD;  Location: [facility];  Service: Orthopedics;  Laterality: Left;   BIOPSY  10/19/2021   Procedure: BIOPSY;  Surgeon: Sharyn Creamer, MD;  Location: [facility];  Service: Gastroenterology;;   COLONOSCOPY N/A 10/19/2021   Procedure: COLONOSCOPY;  Surgeon: Sharyn Creamer, MD;  Location: [facility];  Service: Gastroenterology;  Laterality: N/A;   CYSTOSCOPY WITH RETROGRADE PYELOGRAM, URETEROSCOPY AND STENT PLACEMENT Right 08/02/2015   Procedure: CYSTOSCOPY WITH RETROGRADE PYELOGRAM, URETEROSCOPY , STONE EXTRACTION AND STENT PLACEMENT;  Surgeon: Cleon Gustin, MD;  [facility];  Service: Urology;  Laterality: Right;   ESOPHAGOGASTRODUODENOSCOPY (EGD) WITH PROPOFOL N/A 10/19/2021   Procedure: ESOPHAGOGASTRODUODENOSCOPY (EGD) WITH PROPOFOL;  Surgeon: Sharyn Creamer, MD;  Location: [facility];  Service: Gastroenterology;  Laterality: N/A;   HOLMIUM LASER APPLICATION Right 28/36/6294   Procedure: HOLMIUM LASER APPLICATION;  Surgeon: Cleon Gustin, MD;  [facility];  Service: Urology;  Laterality: Right;   KIDNEY STONE SURGERY      POLYPECTOMY  10/19/2021   Procedure: POLYPECTOMY;  Surgeon: Sharyn Creamer, MD;  Location: [facility];  Service: Gastroenterology;;   [place] Left 06/2010   Laser ablation   Social History   Occupational History   Occupation: controll room operator     Employer: [employer]  Tobacco Use   Smoking status: Never    Passive exposure: Never   Smokeless tobacco: Never  Substance and Sexual Activity   Alcohol use: No    Alcohol/week: 0.0 standard drinks of alcohol   Drug use:  No   Sexual activity: Not on file

## 2022-08-17 DIAGNOSIS — L97122 Non-pressure chronic ulcer of left thigh with fat layer exposed: Secondary | ICD-10-CM | POA: Diagnosis not present

## 2022-08-17 DIAGNOSIS — L97812 Non-pressure chronic ulcer of other part of right lower leg with fat layer exposed: Secondary | ICD-10-CM | POA: Diagnosis not present

## 2022-08-17 DIAGNOSIS — I5030 Unspecified diastolic (congestive) heart failure: Secondary | ICD-10-CM | POA: Diagnosis not present

## 2022-08-17 DIAGNOSIS — E119 Type 2 diabetes mellitus without complications: Secondary | ICD-10-CM | POA: Diagnosis not present

## 2022-08-17 DIAGNOSIS — I110 Hypertensive heart disease with heart failure: Secondary | ICD-10-CM | POA: Diagnosis not present

## 2022-08-17 DIAGNOSIS — I872 Venous insufficiency (chronic) (peripheral): Secondary | ICD-10-CM | POA: Diagnosis not present

## 2022-08-18 DIAGNOSIS — L97122 Non-pressure chronic ulcer of left thigh with fat layer exposed: Secondary | ICD-10-CM | POA: Diagnosis not present

## 2022-08-18 DIAGNOSIS — L97812 Non-pressure chronic ulcer of other part of right lower leg with fat layer exposed: Secondary | ICD-10-CM | POA: Diagnosis not present

## 2022-08-18 DIAGNOSIS — I5030 Unspecified diastolic (congestive) heart failure: Secondary | ICD-10-CM | POA: Diagnosis not present

## 2022-08-18 DIAGNOSIS — I872 Venous insufficiency (chronic) (peripheral): Secondary | ICD-10-CM | POA: Diagnosis not present

## 2022-08-18 DIAGNOSIS — I110 Hypertensive heart disease with heart failure: Secondary | ICD-10-CM | POA: Diagnosis not present

## 2022-08-18 DIAGNOSIS — E119 Type 2 diabetes mellitus without complications: Secondary | ICD-10-CM | POA: Diagnosis not present

## 2022-08-22 DIAGNOSIS — I110 Hypertensive heart disease with heart failure: Secondary | ICD-10-CM | POA: Diagnosis not present

## 2022-08-22 DIAGNOSIS — E119 Type 2 diabetes mellitus without complications: Secondary | ICD-10-CM | POA: Diagnosis not present

## 2022-08-22 DIAGNOSIS — I872 Venous insufficiency (chronic) (peripheral): Secondary | ICD-10-CM | POA: Diagnosis not present

## 2022-08-22 DIAGNOSIS — L97122 Non-pressure chronic ulcer of left thigh with fat layer exposed: Secondary | ICD-10-CM | POA: Diagnosis not present

## 2022-08-22 DIAGNOSIS — I5030 Unspecified diastolic (congestive) heart failure: Secondary | ICD-10-CM | POA: Diagnosis not present

## 2022-08-22 DIAGNOSIS — L97812 Non-pressure chronic ulcer of other part of right lower leg with fat layer exposed: Secondary | ICD-10-CM | POA: Diagnosis not present

## 2022-08-24 ENCOUNTER — Ambulatory Visit: Payer: Medicare Other | Admitting: Orthopedic Surgery

## 2022-08-24 DIAGNOSIS — E119 Type 2 diabetes mellitus without complications: Secondary | ICD-10-CM | POA: Diagnosis not present

## 2022-08-24 DIAGNOSIS — L97122 Non-pressure chronic ulcer of left thigh with fat layer exposed: Secondary | ICD-10-CM | POA: Diagnosis not present

## 2022-08-24 DIAGNOSIS — I5030 Unspecified diastolic (congestive) heart failure: Secondary | ICD-10-CM | POA: Diagnosis not present

## 2022-08-24 DIAGNOSIS — I872 Venous insufficiency (chronic) (peripheral): Secondary | ICD-10-CM | POA: Diagnosis not present

## 2022-08-24 DIAGNOSIS — I110 Hypertensive heart disease with heart failure: Secondary | ICD-10-CM | POA: Diagnosis not present

## 2022-08-24 DIAGNOSIS — L97812 Non-pressure chronic ulcer of other part of right lower leg with fat layer exposed: Secondary | ICD-10-CM | POA: Diagnosis not present

## 2022-08-25 DIAGNOSIS — I5030 Unspecified diastolic (congestive) heart failure: Secondary | ICD-10-CM | POA: Diagnosis not present

## 2022-08-25 DIAGNOSIS — L97122 Non-pressure chronic ulcer of left thigh with fat layer exposed: Secondary | ICD-10-CM | POA: Diagnosis not present

## 2022-08-25 DIAGNOSIS — I872 Venous insufficiency (chronic) (peripheral): Secondary | ICD-10-CM | POA: Diagnosis not present

## 2022-08-25 DIAGNOSIS — E119 Type 2 diabetes mellitus without complications: Secondary | ICD-10-CM | POA: Diagnosis not present

## 2022-08-25 DIAGNOSIS — I110 Hypertensive heart disease with heart failure: Secondary | ICD-10-CM | POA: Diagnosis not present

## 2022-08-25 DIAGNOSIS — L97812 Non-pressure chronic ulcer of other part of right lower leg with fat layer exposed: Secondary | ICD-10-CM | POA: Diagnosis not present

## 2022-08-29 DIAGNOSIS — I5030 Unspecified diastolic (congestive) heart failure: Secondary | ICD-10-CM | POA: Diagnosis not present

## 2022-08-29 DIAGNOSIS — I110 Hypertensive heart disease with heart failure: Secondary | ICD-10-CM | POA: Diagnosis not present

## 2022-08-29 DIAGNOSIS — L97122 Non-pressure chronic ulcer of left thigh with fat layer exposed: Secondary | ICD-10-CM | POA: Diagnosis not present

## 2022-08-29 DIAGNOSIS — I872 Venous insufficiency (chronic) (peripheral): Secondary | ICD-10-CM | POA: Diagnosis not present

## 2022-08-29 DIAGNOSIS — E119 Type 2 diabetes mellitus without complications: Secondary | ICD-10-CM | POA: Diagnosis not present

## 2022-08-29 DIAGNOSIS — L97812 Non-pressure chronic ulcer of other part of right lower leg with fat layer exposed: Secondary | ICD-10-CM | POA: Diagnosis not present

## 2022-08-31 ENCOUNTER — Ambulatory Visit: Payer: Medicare Other | Admitting: Orthopedic Surgery

## 2022-08-31 DIAGNOSIS — L97122 Non-pressure chronic ulcer of left thigh with fat layer exposed: Secondary | ICD-10-CM | POA: Diagnosis not present

## 2022-08-31 DIAGNOSIS — I5030 Unspecified diastolic (congestive) heart failure: Secondary | ICD-10-CM | POA: Diagnosis not present

## 2022-08-31 DIAGNOSIS — L97812 Non-pressure chronic ulcer of other part of right lower leg with fat layer exposed: Secondary | ICD-10-CM | POA: Diagnosis not present

## 2022-08-31 DIAGNOSIS — I110 Hypertensive heart disease with heart failure: Secondary | ICD-10-CM | POA: Diagnosis not present

## 2022-08-31 DIAGNOSIS — E119 Type 2 diabetes mellitus without complications: Secondary | ICD-10-CM | POA: Diagnosis not present

## 2022-08-31 DIAGNOSIS — I872 Venous insufficiency (chronic) (peripheral): Secondary | ICD-10-CM | POA: Diagnosis not present

## 2022-09-01 DIAGNOSIS — L97812 Non-pressure chronic ulcer of other part of right lower leg with fat layer exposed: Secondary | ICD-10-CM | POA: Diagnosis not present

## 2022-09-01 DIAGNOSIS — I110 Hypertensive heart disease with heart failure: Secondary | ICD-10-CM | POA: Diagnosis not present

## 2022-09-01 DIAGNOSIS — L97122 Non-pressure chronic ulcer of left thigh with fat layer exposed: Secondary | ICD-10-CM | POA: Diagnosis not present

## 2022-09-01 DIAGNOSIS — I872 Venous insufficiency (chronic) (peripheral): Secondary | ICD-10-CM | POA: Diagnosis not present

## 2022-09-01 DIAGNOSIS — I5030 Unspecified diastolic (congestive) heart failure: Secondary | ICD-10-CM | POA: Diagnosis not present

## 2022-09-01 DIAGNOSIS — E119 Type 2 diabetes mellitus without complications: Secondary | ICD-10-CM | POA: Diagnosis not present

## 2022-09-05 DIAGNOSIS — E119 Type 2 diabetes mellitus without complications: Secondary | ICD-10-CM | POA: Diagnosis not present

## 2022-09-05 DIAGNOSIS — I5030 Unspecified diastolic (congestive) heart failure: Secondary | ICD-10-CM | POA: Diagnosis not present

## 2022-09-05 DIAGNOSIS — I110 Hypertensive heart disease with heart failure: Secondary | ICD-10-CM | POA: Diagnosis not present

## 2022-09-05 DIAGNOSIS — L97812 Non-pressure chronic ulcer of other part of right lower leg with fat layer exposed: Secondary | ICD-10-CM | POA: Diagnosis not present

## 2022-09-05 DIAGNOSIS — L97122 Non-pressure chronic ulcer of left thigh with fat layer exposed: Secondary | ICD-10-CM | POA: Diagnosis not present

## 2022-09-05 DIAGNOSIS — I872 Venous insufficiency (chronic) (peripheral): Secondary | ICD-10-CM | POA: Diagnosis not present

## 2022-09-08 DIAGNOSIS — I110 Hypertensive heart disease with heart failure: Secondary | ICD-10-CM | POA: Diagnosis not present

## 2022-09-08 DIAGNOSIS — E119 Type 2 diabetes mellitus without complications: Secondary | ICD-10-CM | POA: Diagnosis not present

## 2022-09-08 DIAGNOSIS — L97122 Non-pressure chronic ulcer of left thigh with fat layer exposed: Secondary | ICD-10-CM | POA: Diagnosis not present

## 2022-09-08 DIAGNOSIS — L97812 Non-pressure chronic ulcer of other part of right lower leg with fat layer exposed: Secondary | ICD-10-CM | POA: Diagnosis not present

## 2022-09-08 DIAGNOSIS — I5030 Unspecified diastolic (congestive) heart failure: Secondary | ICD-10-CM | POA: Diagnosis not present

## 2022-09-08 DIAGNOSIS — I872 Venous insufficiency (chronic) (peripheral): Secondary | ICD-10-CM | POA: Diagnosis not present

## 2022-09-12 DIAGNOSIS — I872 Venous insufficiency (chronic) (peripheral): Secondary | ICD-10-CM | POA: Diagnosis not present

## 2022-09-12 DIAGNOSIS — I5030 Unspecified diastolic (congestive) heart failure: Secondary | ICD-10-CM | POA: Diagnosis not present

## 2022-09-12 DIAGNOSIS — I110 Hypertensive heart disease with heart failure: Secondary | ICD-10-CM | POA: Diagnosis not present

## 2022-09-12 DIAGNOSIS — E119 Type 2 diabetes mellitus without complications: Secondary | ICD-10-CM | POA: Diagnosis not present

## 2022-09-12 DIAGNOSIS — L97812 Non-pressure chronic ulcer of other part of right lower leg with fat layer exposed: Secondary | ICD-10-CM | POA: Diagnosis not present

## 2022-09-12 DIAGNOSIS — L97122 Non-pressure chronic ulcer of left thigh with fat layer exposed: Secondary | ICD-10-CM | POA: Diagnosis not present

## 2022-09-13 DIAGNOSIS — E1121 Type 2 diabetes mellitus with diabetic nephropathy: Secondary | ICD-10-CM | POA: Diagnosis not present

## 2022-09-13 DIAGNOSIS — Z8673 Personal history of transient ischemic attack (TIA), and cerebral infarction without residual deficits: Secondary | ICD-10-CM | POA: Diagnosis not present

## 2022-09-13 DIAGNOSIS — N200 Calculus of kidney: Secondary | ICD-10-CM | POA: Diagnosis not present

## 2022-09-13 DIAGNOSIS — Z7982 Long term (current) use of aspirin: Secondary | ICD-10-CM | POA: Diagnosis not present

## 2022-09-13 DIAGNOSIS — Z794 Long term (current) use of insulin: Secondary | ICD-10-CM | POA: Diagnosis not present

## 2022-09-13 DIAGNOSIS — L89892 Pressure ulcer of other site, stage 2: Secondary | ICD-10-CM | POA: Diagnosis not present

## 2022-09-13 DIAGNOSIS — Z7902 Long term (current) use of antithrombotics/antiplatelets: Secondary | ICD-10-CM | POA: Diagnosis not present

## 2022-09-13 DIAGNOSIS — I110 Hypertensive heart disease with heart failure: Secondary | ICD-10-CM | POA: Diagnosis not present

## 2022-09-13 DIAGNOSIS — Z89512 Acquired absence of left leg below knee: Secondary | ICD-10-CM | POA: Diagnosis not present

## 2022-09-13 DIAGNOSIS — E669 Obesity, unspecified: Secondary | ICD-10-CM | POA: Diagnosis not present

## 2022-09-13 DIAGNOSIS — E7800 Pure hypercholesterolemia, unspecified: Secondary | ICD-10-CM | POA: Diagnosis not present

## 2022-09-13 DIAGNOSIS — L97212 Non-pressure chronic ulcer of right calf with fat layer exposed: Secondary | ICD-10-CM | POA: Diagnosis not present

## 2022-09-13 DIAGNOSIS — D649 Anemia, unspecified: Secondary | ICD-10-CM | POA: Diagnosis not present

## 2022-09-13 DIAGNOSIS — I872 Venous insufficiency (chronic) (peripheral): Secondary | ICD-10-CM | POA: Diagnosis not present

## 2022-09-13 DIAGNOSIS — I5030 Unspecified diastolic (congestive) heart failure: Secondary | ICD-10-CM | POA: Diagnosis not present

## 2022-09-14 DIAGNOSIS — I872 Venous insufficiency (chronic) (peripheral): Secondary | ICD-10-CM | POA: Diagnosis not present

## 2022-09-14 DIAGNOSIS — E1121 Type 2 diabetes mellitus with diabetic nephropathy: Secondary | ICD-10-CM | POA: Diagnosis not present

## 2022-09-14 DIAGNOSIS — I110 Hypertensive heart disease with heart failure: Secondary | ICD-10-CM | POA: Diagnosis not present

## 2022-09-14 DIAGNOSIS — I5030 Unspecified diastolic (congestive) heart failure: Secondary | ICD-10-CM | POA: Diagnosis not present

## 2022-09-14 DIAGNOSIS — L97212 Non-pressure chronic ulcer of right calf with fat layer exposed: Secondary | ICD-10-CM | POA: Diagnosis not present

## 2022-09-14 DIAGNOSIS — L89892 Pressure ulcer of other site, stage 2: Secondary | ICD-10-CM | POA: Diagnosis not present

## 2022-09-15 DIAGNOSIS — I5030 Unspecified diastolic (congestive) heart failure: Secondary | ICD-10-CM | POA: Diagnosis not present

## 2022-09-15 DIAGNOSIS — I110 Hypertensive heart disease with heart failure: Secondary | ICD-10-CM | POA: Diagnosis not present

## 2022-09-15 DIAGNOSIS — L89892 Pressure ulcer of other site, stage 2: Secondary | ICD-10-CM | POA: Diagnosis not present

## 2022-09-15 DIAGNOSIS — E1121 Type 2 diabetes mellitus with diabetic nephropathy: Secondary | ICD-10-CM | POA: Diagnosis not present

## 2022-09-15 DIAGNOSIS — I872 Venous insufficiency (chronic) (peripheral): Secondary | ICD-10-CM | POA: Diagnosis not present

## 2022-09-15 DIAGNOSIS — L97212 Non-pressure chronic ulcer of right calf with fat layer exposed: Secondary | ICD-10-CM | POA: Diagnosis not present

## 2022-09-19 DIAGNOSIS — L97212 Non-pressure chronic ulcer of right calf with fat layer exposed: Secondary | ICD-10-CM | POA: Diagnosis not present

## 2022-09-19 DIAGNOSIS — L89892 Pressure ulcer of other site, stage 2: Secondary | ICD-10-CM | POA: Diagnosis not present

## 2022-09-19 DIAGNOSIS — I5030 Unspecified diastolic (congestive) heart failure: Secondary | ICD-10-CM | POA: Diagnosis not present

## 2022-09-19 DIAGNOSIS — E1121 Type 2 diabetes mellitus with diabetic nephropathy: Secondary | ICD-10-CM | POA: Diagnosis not present

## 2022-09-19 DIAGNOSIS — I872 Venous insufficiency (chronic) (peripheral): Secondary | ICD-10-CM | POA: Diagnosis not present

## 2022-09-19 DIAGNOSIS — I110 Hypertensive heart disease with heart failure: Secondary | ICD-10-CM | POA: Diagnosis not present

## 2022-09-21 ENCOUNTER — Ambulatory Visit (INDEPENDENT_AMBULATORY_CARE_PROVIDER_SITE_OTHER): Payer: Medicare Other | Admitting: Orthopedic Surgery

## 2022-09-21 DIAGNOSIS — Z89512 Acquired absence of left leg below knee: Secondary | ICD-10-CM

## 2022-09-21 DIAGNOSIS — L97919 Non-pressure chronic ulcer of unspecified part of right lower leg with unspecified severity: Secondary | ICD-10-CM

## 2022-09-21 DIAGNOSIS — I83019 Varicose veins of right lower extremity with ulcer of unspecified site: Secondary | ICD-10-CM

## 2022-09-22 ENCOUNTER — Encounter: Payer: Self-pay | Admitting: Orthopedic Surgery

## 2022-09-22 DIAGNOSIS — L89892 Pressure ulcer of other site, stage 2: Secondary | ICD-10-CM | POA: Diagnosis not present

## 2022-09-22 DIAGNOSIS — L97212 Non-pressure chronic ulcer of right calf with fat layer exposed: Secondary | ICD-10-CM | POA: Diagnosis not present

## 2022-09-22 DIAGNOSIS — E1121 Type 2 diabetes mellitus with diabetic nephropathy: Secondary | ICD-10-CM | POA: Diagnosis not present

## 2022-09-22 DIAGNOSIS — I110 Hypertensive heart disease with heart failure: Secondary | ICD-10-CM | POA: Diagnosis not present

## 2022-09-22 DIAGNOSIS — I5030 Unspecified diastolic (congestive) heart failure: Secondary | ICD-10-CM | POA: Diagnosis not present

## 2022-09-22 DIAGNOSIS — I872 Venous insufficiency (chronic) (peripheral): Secondary | ICD-10-CM | POA: Diagnosis not present

## 2022-09-22 NOTE — Progress Notes (Signed)
Office Visit Note   Patient: [name] [name]           Date of Birth: Jul 02, 1956           MRN: [id number] Visit Date: 09/21/2022              Requested by: Sasser, Silvestre Moment, MD [address] [place],  [place] [address] PCP: Manon Hilding, MD  Chief Complaint  Patient presents with   Right Leg - Follow-up   Left Leg - Follow-up    Hx BKA       HPI: Patient is a 67 year old gentleman who is status post a left below-knee amputation and currently under active treatment for right leg venous insufficiency.  Currently with home health nursing wrapping 2 times a week.  Assessment & Plan: Visit Diagnoses:  1. Left below-knee amputee ([place])   2. Venous ulcer of right leg ([place])     Plan: Patient will continue with home health nursing wrapping.  Nails were trimmed x 4.  Follow-Up Instructions: Return in about 4 weeks (around 10/19/2022).   Ortho Exam  Patient is alert, oriented, no adenopathy, well-dressed, normal affect, normal respiratory effort. Examination patient has onychomycotic nails on the right foot.  Nails were trimmed x 4 without complications.  Patient still has swelling in the right lower extremity and significant swelling in the right foot however he is showing good steady improvement.  Patient currently ambulating in a motorized wheelchair.  The left below-knee amputation is well consolidated no ulcers calluses or cellulitis.  Imaging: No results found. No images are attached to the encounter.  Labs: Lab Results  Component Value Date   HGBA1C 7.3 (H) 10/17/2021   HGBA1C 6.5 (H) 09/12/2019   CRP 17.6 (H) 09/14/2019   CRP 14.7 (H) 09/13/2019   CRP 14.6 (H) 09/12/2019   REPTSTATUS 11/20/2021 FINAL 11/17/2021   CULT (A) 11/17/2021    >=100,000 COLONIES/mL ESCHERICHIA COLI >=100,000 COLONIES/mL KLEBSIELLA OXYTOCA    LABORGA ESCHERICHIA COLI (A) 11/17/2021   LABORGA KLEBSIELLA OXYTOCA (A) 11/17/2021     Lab Results  Component Value Date   ALBUMIN 2.6 (L) 11/10/2021    ALBUMIN 1.8 (L) 10/25/2021   ALBUMIN 2.5 (L) 10/14/2021    Lab Results  Component Value Date   MG 2.1 09/14/2019   MG 2.3 09/13/2019   MG 1.9 09/12/2019   No results found for: "VD25OH"  No results found for: "PREALBUMIN"    Latest Ref Rng & Units 12/21/2021    9:53 AM 11/10/2021    6:54 AM 10/31/2021    5:00 AM  CBC EXTENDED  WBC 4.0 - 10.5 K/uL 7.6  6.5  7.9   RBC 4.22 - 5.81 Mil/uL 4.57  3.88  3.22   Hemoglobin 13.0 - 17.0 g/dL 11.4  9.5  7.8   HCT 39.0 - 52.0 % 35.6  33.0  26.5   Platelets 150.0 - 400.0 K/uL 230.0  176  243   NEUT# 1.4 - 7.7 K/uL 5.3  4.3    Lymph# 0.7 - 4.0 K/uL 0.9  0.8       There is no height or weight on file to calculate BMI.  Orders:  No orders of the defined types were placed in this encounter.  No orders of the defined types were placed in this encounter.    Procedures: No procedures performed  Clinical Data: No additional findings.  ROS:  All other systems negative, except as noted in the HPI. Review of Systems  Objective: Vital Signs: There were no vitals taken for this visit.  Specialty Comments:  No specialty comments available.  PMFS History: Patient Active Problem List   Diagnosis Date Noted   Acute on chronic diastolic (congestive) heart failure (HCC)    Controlled type 2 diabetes mellitus with hyperglycemia, without long-term current use of insulin (HCC)    Phantom limb pain (HCC)    Left below-knee amputee ([place]) 10/25/2021   Left leg pain    Osteomyelitis of ankle and foot ([place]) 10/15/2021   Anemia 10/15/2021   Pressure ulcer 10/15/2021   History of CVA (cerebrovascular accident) 10/15/2021   Septic arthritis of ankle and foot region ([place]) 10/15/2021   AKI (acute kidney injury) ([place]) 10/14/2021   Pneumonia due to COVID-19 virus 09/09/2019   Hypokalemia 09/09/2019   Acute respiratory failure with hypoxia ([place]) 09/09/2019   Essential hypertension 10/14/2014   Chronic diastolic heart failure ([place]) 10/14/2014    Morbid obesity ([place]) 07/22/2013   Varicose veins of lower extremities with other complications 59/74/1638   OSA (obstructive sleep apnea) 07/22/2013   Venous stasis ulcers ([place]) 07/22/2013   Diabetes mellitus ([place]) 01/03/2011   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[place])    Diverticulosis    Essential hypertension    Gastritis    History of nephrolithiasis    History of open leg wound    History of stroke 03/19/2003   Lymphedema    Obstructive sleep apnea    Peptic ulcer    TIA (transient ischemic attack)    Varicose veins    Laser ablation of left great saphenous vein 2009    Family History  Problem Relation Age of Onset   Bone cancer Mother    Hypertension Father    Pancreatic cancer Maternal Grandmother    Stroke Paternal Grandmother    Diabetes Paternal Grandfather    Heart attack Brother    Hypertension Brother    Heart attack Sister     Past Surgical History:  Procedure Laterality Date   AMPUTATION Left 10/21/2021   Procedure: LEFT BELOW KNEE AMPUTATION;  Surgeon: [name], MD;  Location: [facility];  Service: Orthopedics;  Laterality: Left;   BIOPSY  10/19/2021   Procedure: BIOPSY;  Surgeon: Sharyn Creamer, MD;  Location: [facility];  Service: Gastroenterology;;   COLONOSCOPY N/A 10/19/2021   Procedure: COLONOSCOPY;  Surgeon: Sharyn Creamer, MD;  Location: [facility];  Service: Gastroenterology;  Laterality: N/A;   CYSTOSCOPY WITH RETROGRADE PYELOGRAM, URETEROSCOPY AND STENT PLACEMENT Right 08/02/2015   Procedure: CYSTOSCOPY WITH RETROGRADE PYELOGRAM, URETEROSCOPY , STONE EXTRACTION AND STENT PLACEMENT;  Surgeon: Cleon Gustin, MD;  [facility];  Service: Urology;  Laterality: Right;   ESOPHAGOGASTRODUODENOSCOPY (EGD) WITH PROPOFOL N/A 10/19/2021   Procedure: ESOPHAGOGASTRODUODENOSCOPY (EGD) WITH PROPOFOL;  Surgeon: Sharyn Creamer, MD;  Location: [facility];  Service: Gastroenterology;  Laterality: N/A;   HOLMIUM  LASER APPLICATION Right 45/36/4680   Procedure: HOLMIUM LASER APPLICATION;  Surgeon: Cleon Gustin, MD;  [facility];  Service: Urology;  Laterality: Right;   KIDNEY STONE SURGERY     POLYPECTOMY  10/19/2021   Procedure: POLYPECTOMY;  Surgeon: Sharyn Creamer, MD;  Location: [facility];  Service: Gastroenterology;;   [place] Left 06/2010   Laser ablation   Social History   Occupational History   Occupation: controll room operator     Employer: [employer]  Tobacco Use  Smoking status: Never    Passive exposure: Never   Smokeless tobacco: Never  Substance and Sexual Activity   Alcohol use: No    Alcohol/week: 0.0 standard drinks of alcohol   Drug use: No   Sexual activity: Not on file

## 2022-09-25 DIAGNOSIS — E1121 Type 2 diabetes mellitus with diabetic nephropathy: Secondary | ICD-10-CM | POA: Diagnosis not present

## 2022-09-25 DIAGNOSIS — L97212 Non-pressure chronic ulcer of right calf with fat layer exposed: Secondary | ICD-10-CM | POA: Diagnosis not present

## 2022-09-25 DIAGNOSIS — L89892 Pressure ulcer of other site, stage 2: Secondary | ICD-10-CM | POA: Diagnosis not present

## 2022-09-25 DIAGNOSIS — I5030 Unspecified diastolic (congestive) heart failure: Secondary | ICD-10-CM | POA: Diagnosis not present

## 2022-09-25 DIAGNOSIS — I872 Venous insufficiency (chronic) (peripheral): Secondary | ICD-10-CM | POA: Diagnosis not present

## 2022-09-25 DIAGNOSIS — I110 Hypertensive heart disease with heart failure: Secondary | ICD-10-CM | POA: Diagnosis not present

## 2022-09-26 DIAGNOSIS — I872 Venous insufficiency (chronic) (peripheral): Secondary | ICD-10-CM | POA: Diagnosis not present

## 2022-09-26 DIAGNOSIS — I5030 Unspecified diastolic (congestive) heart failure: Secondary | ICD-10-CM | POA: Diagnosis not present

## 2022-09-26 DIAGNOSIS — L89892 Pressure ulcer of other site, stage 2: Secondary | ICD-10-CM | POA: Diagnosis not present

## 2022-09-26 DIAGNOSIS — L97212 Non-pressure chronic ulcer of right calf with fat layer exposed: Secondary | ICD-10-CM | POA: Diagnosis not present

## 2022-09-26 DIAGNOSIS — I110 Hypertensive heart disease with heart failure: Secondary | ICD-10-CM | POA: Diagnosis not present

## 2022-09-26 DIAGNOSIS — E1121 Type 2 diabetes mellitus with diabetic nephropathy: Secondary | ICD-10-CM | POA: Diagnosis not present

## 2022-09-29 DIAGNOSIS — L97212 Non-pressure chronic ulcer of right calf with fat layer exposed: Secondary | ICD-10-CM | POA: Diagnosis not present

## 2022-09-29 DIAGNOSIS — L89892 Pressure ulcer of other site, stage 2: Secondary | ICD-10-CM | POA: Diagnosis not present

## 2022-09-29 DIAGNOSIS — I110 Hypertensive heart disease with heart failure: Secondary | ICD-10-CM | POA: Diagnosis not present

## 2022-09-29 DIAGNOSIS — I872 Venous insufficiency (chronic) (peripheral): Secondary | ICD-10-CM | POA: Diagnosis not present

## 2022-09-29 DIAGNOSIS — E1121 Type 2 diabetes mellitus with diabetic nephropathy: Secondary | ICD-10-CM | POA: Diagnosis not present

## 2022-09-29 DIAGNOSIS — I5030 Unspecified diastolic (congestive) heart failure: Secondary | ICD-10-CM | POA: Diagnosis not present

## 2022-10-03 DIAGNOSIS — I872 Venous insufficiency (chronic) (peripheral): Secondary | ICD-10-CM | POA: Diagnosis not present

## 2022-10-03 DIAGNOSIS — D649 Anemia, unspecified: Secondary | ICD-10-CM | POA: Diagnosis not present

## 2022-10-03 DIAGNOSIS — L97212 Non-pressure chronic ulcer of right calf with fat layer exposed: Secondary | ICD-10-CM | POA: Diagnosis not present

## 2022-10-03 DIAGNOSIS — E7800 Pure hypercholesterolemia, unspecified: Secondary | ICD-10-CM | POA: Diagnosis not present

## 2022-10-03 DIAGNOSIS — I5030 Unspecified diastolic (congestive) heart failure: Secondary | ICD-10-CM | POA: Diagnosis not present

## 2022-10-03 DIAGNOSIS — L89892 Pressure ulcer of other site, stage 2: Secondary | ICD-10-CM | POA: Diagnosis not present

## 2022-10-03 DIAGNOSIS — I110 Hypertensive heart disease with heart failure: Secondary | ICD-10-CM | POA: Diagnosis not present

## 2022-10-03 DIAGNOSIS — E1121 Type 2 diabetes mellitus with diabetic nephropathy: Secondary | ICD-10-CM | POA: Diagnosis not present

## 2022-10-04 DIAGNOSIS — I5030 Unspecified diastolic (congestive) heart failure: Secondary | ICD-10-CM | POA: Diagnosis not present

## 2022-10-04 DIAGNOSIS — L89892 Pressure ulcer of other site, stage 2: Secondary | ICD-10-CM | POA: Diagnosis not present

## 2022-10-04 DIAGNOSIS — E1121 Type 2 diabetes mellitus with diabetic nephropathy: Secondary | ICD-10-CM | POA: Diagnosis not present

## 2022-10-04 DIAGNOSIS — I10 Essential (primary) hypertension: Secondary | ICD-10-CM | POA: Diagnosis not present

## 2022-10-04 DIAGNOSIS — I110 Hypertensive heart disease with heart failure: Secondary | ICD-10-CM | POA: Diagnosis not present

## 2022-10-04 DIAGNOSIS — I872 Venous insufficiency (chronic) (peripheral): Secondary | ICD-10-CM | POA: Diagnosis not present

## 2022-10-04 DIAGNOSIS — L97212 Non-pressure chronic ulcer of right calf with fat layer exposed: Secondary | ICD-10-CM | POA: Diagnosis not present

## 2022-10-06 DIAGNOSIS — E1121 Type 2 diabetes mellitus with diabetic nephropathy: Secondary | ICD-10-CM | POA: Diagnosis not present

## 2022-10-06 DIAGNOSIS — I872 Venous insufficiency (chronic) (peripheral): Secondary | ICD-10-CM | POA: Diagnosis not present

## 2022-10-06 DIAGNOSIS — I5030 Unspecified diastolic (congestive) heart failure: Secondary | ICD-10-CM | POA: Diagnosis not present

## 2022-10-06 DIAGNOSIS — I110 Hypertensive heart disease with heart failure: Secondary | ICD-10-CM | POA: Diagnosis not present

## 2022-10-06 DIAGNOSIS — L97212 Non-pressure chronic ulcer of right calf with fat layer exposed: Secondary | ICD-10-CM | POA: Diagnosis not present

## 2022-10-06 DIAGNOSIS — L89892 Pressure ulcer of other site, stage 2: Secondary | ICD-10-CM | POA: Diagnosis not present

## 2022-10-09 ENCOUNTER — Encounter: Payer: Self-pay | Admitting: Nurse Practitioner

## 2022-10-09 ENCOUNTER — Ambulatory Visit: Payer: Medicare Other | Attending: Nurse Practitioner | Admitting: Nurse Practitioner

## 2022-10-09 VITALS — BP 136/82 | HR 68 | Ht 70.0 in

## 2022-10-09 DIAGNOSIS — Z1389 Encounter for screening for other disorder: Secondary | ICD-10-CM | POA: Diagnosis not present

## 2022-10-09 DIAGNOSIS — Z23 Encounter for immunization: Secondary | ICD-10-CM | POA: Diagnosis not present

## 2022-10-09 DIAGNOSIS — R600 Localized edema: Secondary | ICD-10-CM | POA: Diagnosis not present

## 2022-10-09 DIAGNOSIS — I77819 Aortic ectasia, unspecified site: Secondary | ICD-10-CM

## 2022-10-09 DIAGNOSIS — Z1331 Encounter for screening for depression: Secondary | ICD-10-CM | POA: Diagnosis not present

## 2022-10-09 DIAGNOSIS — E7801 Familial hypercholesterolemia: Secondary | ICD-10-CM | POA: Diagnosis not present

## 2022-10-09 DIAGNOSIS — I10 Essential (primary) hypertension: Secondary | ICD-10-CM | POA: Diagnosis not present

## 2022-10-09 DIAGNOSIS — E1129 Type 2 diabetes mellitus with other diabetic kidney complication: Secondary | ICD-10-CM | POA: Diagnosis not present

## 2022-10-09 DIAGNOSIS — I5032 Chronic diastolic (congestive) heart failure: Secondary | ICD-10-CM

## 2022-10-09 DIAGNOSIS — R296 Repeated falls: Secondary | ICD-10-CM | POA: Diagnosis not present

## 2022-10-09 DIAGNOSIS — G4733 Obstructive sleep apnea (adult) (pediatric): Secondary | ICD-10-CM | POA: Diagnosis not present

## 2022-10-09 DIAGNOSIS — E1121 Type 2 diabetes mellitus with diabetic nephropathy: Secondary | ICD-10-CM | POA: Diagnosis not present

## 2022-10-09 DIAGNOSIS — Z8673 Personal history of transient ischemic attack (TIA), and cerebral infarction without residual deficits: Secondary | ICD-10-CM | POA: Diagnosis not present

## 2022-10-09 DIAGNOSIS — I5022 Chronic systolic (congestive) heart failure: Secondary | ICD-10-CM | POA: Diagnosis not present

## 2022-10-09 DIAGNOSIS — I5020 Unspecified systolic (congestive) heart failure: Secondary | ICD-10-CM

## 2022-10-09 MED ORDER — SACUBITRIL-VALSARTAN 24-26 MG PO TABS: 1.0000 | ORAL_TABLET | Freq: Two times a day (BID) | ORAL | 11 refills | Status: AC

## 2022-10-09 NOTE — Progress Notes (Signed)
Cardiology Office Note:    Date:  10/09/2022  ID:  [name] [name] [birth year], MRN [id number]  PCP:  [name] Hilding, MD   [facility] Cardiologist:  [name] Lesches, MD     Referring MD: [name] Hilding, MD   CC: Here for follow-up  History of Present Illness:    [name] [name] is a 67 y.o. male with a hx of the following:  HFmrEF HTN Hx of TIA in 2000 OSA, no CPAP.  Hx of stroke in 1983  Patient is a very pleasant 67 year old male with past medical history as mentioned above.  Echocardiogram in August 2023 revealed mildly reduced EF of 45 to 31%, mild diastolic dysfunction (grade 1 DD).  Last seen by Dr. Domenic [name] on June 07, 2022.  Was not able to stand away at the time, was working on being stable on his left leg prosthesis.  Was started on Ozempic by PCP.  Was continued on Aldactone, Toprol-XL, and hydralazine no medication changes were made.  Was told to follow-up in January 2024.   He presents for follow-up with his sister.  He states he is doing well. Chronic hx of leg edema, skin breakdown, weeping skin, and chronic wounds for over 20 years. Sees Dr. Sharol [name] who is managing this. States HH RN comes to his house to perform wound care and wrap his right lower leg. 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 Overall doing well from a cardiac perspective.  He can tell his weight is down.  Denies any chest pain, shortness of breath, palpitations, syncope, presyncope, dizziness, orthopnea, PND, worsening swelling or significant weight changes, acute bleeding, or claudication.  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[place])    Diverticulosis    Essential hypertension    Gastritis    History of nephrolithiasis    History of open leg wound    History of stroke 03/19/2003   Lymphedema    Obstructive sleep apnea    Peptic ulcer    TIA (transient ischemic attack)    Varicose veins    Laser ablation of left great saphenous vein 2009     Past Surgical History:  Procedure Laterality Date   AMPUTATION Left 10/21/2021   Procedure: LEFT BELOW KNEE AMPUTATION;  Surgeon: [name] Minion, MD;  Location: [facility];  Service: Orthopedics;  Laterality: Left;   BIOPSY  10/19/2021   Procedure: BIOPSY;  Surgeon: [name] Creamer, MD;  Location: [facility];  Service: Gastroenterology;;   COLONOSCOPY N/A 10/19/2021   Procedure: COLONOSCOPY;  Surgeon: [name] Creamer, MD;  Location: [facility];  Service: Gastroenterology;  Laterality: N/A;   CYSTOSCOPY WITH RETROGRADE PYELOGRAM, URETEROSCOPY AND STENT PLACEMENT Right 08/02/2015   Procedure: CYSTOSCOPY WITH RETROGRADE PYELOGRAM, URETEROSCOPY , STONE EXTRACTION AND STENT PLACEMENT;  Surgeon: [name] Gustin, MD;  [facility];  Service: Urology;  Laterality: Right;   ESOPHAGOGASTRODUODENOSCOPY (EGD) WITH PROPOFOL N/A 10/19/2021   Procedure: ESOPHAGOGASTRODUODENOSCOPY (EGD) WITH PROPOFOL;  Surgeon: [name] Creamer, MD;  Location: [facility];  Service: Gastroenterology;  Laterality: N/A;   HOLMIUM LASER APPLICATION Right 54/00/8676   Procedure: HOLMIUM LASER APPLICATION;  Surgeon: [name] Gustin, MD;  [facility];  Service: Urology;  Laterality: Right;   KIDNEY STONE SURGERY     POLYPECTOMY  10/19/2021   Procedure: POLYPECTOMY;  Surgeon: [name] Creamer, MD;  Location: [facility];  Service: Gastroenterology;;   [place] Left 06/2010  Laser ablation    Current Medications: Current Meds  Medication Sig   acetaminophen (TYLENOL) 325 MG tablet Take 1-2 tablets (325-650 mg total) by mouth every 6 (six) hours as needed for mild pain (pain score 1-3 or temp > 100.5).   allopurinol (ZYLOPRIM) 300 MG tablet Take 300 mg by mouth daily.   Ascorbic Acid (VITAMIN C) 1000 MG tablet Take 1,000 mg by mouth as needed.   aspirin EC 81 MG tablet Take 1 tablet (81 mg total) by mouth daily.   atorvastatin (LIPITOR) 10 MG tablet Take 10 mg by mouth daily.   clopidogrel (PLAVIX)  75 MG tablet Take 75 mg by mouth daily.   ferrous sulfate 325 (65 FE) MG tablet Take 1 tablet (325 mg total) by mouth daily with breakfast.   furosemide (LASIX) 40 MG tablet Take 40 mg by mouth daily.   gabapentin (NEURONTIN) 100 MG capsule Take 1 capsule (100 mg total) by mouth at bedtime.   hydrALAZINE (APRESOLINE) 50 MG tablet Take 50 mg by mouth 3 (three) times daily.   metoprolol succinate (TOPROL-XL) 25 MG 24 hr tablet Take 1 tablet (25 mg total) by mouth daily. (Patient taking differently: Take 50 mg by mouth daily.)   Semaglutide (OZEMPIC, 0.25 OR 0.5 MG/DOSE, [place]) Inject into the skin once a week.   spironolactone (ALDACTONE) 25 MG tablet Take 0.5 tablets (12.5 mg total) by mouth daily.     Allergies:   Patient has no known allergies.   Social History   Socioeconomic History   Marital status: Married    Spouse name: Not on file   Number of children: 4   Years of education: 14   Highest education level: Not on file  Occupational History   Occupation: controll Financial planner     Employer: [employer]  Tobacco Use   Smoking status: Never    Passive exposure: Never   Smokeless tobacco: Never  Vaping Use   Vaping Use: Never used  Substance and Sexual Activity   Alcohol use: No    Alcohol/week: 0.0 standard drinks of alcohol   Drug use: No   Sexual activity: Not on file  Other Topics Concern   Not on file  Social History Narrative   Not on file   Social Determinants of Health   Financial Resource Strain: Not on file  Food Insecurity: Not on file  Transportation Needs: Not on file  Physical Activity: Not on file  Stress: Not on file  Social Connections: Not on file     Family History: The patient's family history includes Bone cancer in his mother; Diabetes in his paternal grandfather; Heart attack in his brother and sister; Hypertension in his brother and father; Pancreatic cancer in his maternal grandmother; Stroke in his paternal grandmother.  ROS:   Review of  Systems  Constitutional: Negative.   HENT: Negative.    Eyes: Negative.   Respiratory: Negative.    Cardiovascular:  Positive for leg swelling. Negative for chest pain, palpitations, orthopnea, claudication and PND.       See HPI.   Gastrointestinal: Negative.   Genitourinary: Negative.   Musculoskeletal: Negative.   Skin: Negative.        See HPI.   Neurological: Negative.   Endo/Heme/Allergies: Negative.   Psychiatric/Behavioral: Negative.      Please see the history of present illness.    All other systems reviewed and are negative.  EKGs/Labs/Other Studies Reviewed:    The following studies were reviewed today:   EKG:  EKG is not ordered today.     Echocardiogram on 05/02/2022: 1. Left ventricular ejection fraction, by estimation, is 45 to 50%. The  left ventricle has mildly decreased function. The left ventricle  demonstrates global hypokinesis. The left ventricular internal cavity size  was mildly dilated. There is severe left  ventricular hypertrophy. Left ventricular diastolic parameters are  consistent with Grade I diastolic dysfunction (impaired relaxation). The  average left ventricular global longitudinal strain is -13.9 %. The global  longitudinal strain is abnormal.   2. Right ventricular systolic function is low normal. The right  ventricular size is normal. Tricuspid regurgitation signal is inadequate  for assessing PA pressure.   3. The mitral valve is grossly normal. Trivial mitral valve  regurgitation.   4. The aortic valve is tricuspid. Aortic valve regurgitation is mild.  Aortic valve sclerosis is present, with no evidence of aortic valve  stenosis.   5. Aortic dilatation noted. There is moderate dilatation of the ascending  aorta, measuring 43 mm.   6. The inferior vena cava is normal in size with greater than 50%  respiratory variability, suggesting right atrial pressure of 3 mmHg.   Comparison(s): Prior images unable to be directly viewed.     Recent Labs: 10/20/2021: B Natriuretic Peptide 538.5 11/10/2021: ALT 16 11/14/2021: BUN 20; Creatinine, Ser 1.21; Potassium 4.1; Sodium 140 12/21/2021: Hemoglobin 11.4; Platelets 230.0  Recent Lipid Panel    Component Value Date/Time   TRIG 35 09/09/2019 0234   Physical Exam:    VS:  BP 136/82 (BP Location: Right Arm, Patient Position: Sitting, Cuff Size: Normal)   Pulse 68   Ht '5\' 10"'$  (1.778 m)   SpO2 90%   BMI 44.48 kg/m     Wt Readings from Last 3 Encounters:  04/24/22 (!) 310 lb (140.6 kg)  11/17/21 (!) 326 lb 4.5 oz (148 kg)  10/15/21 (!) 351 lb (159.2 kg)     GEN: Morbidly obese, 67 y.o. male in no acute distress HEENT: Normal NECK: No JVD; No carotid bruits CARDIAC: S1/S2, RRR, no murmurs, rubs, gallops; 2+ pulses RESPIRATORY:  Clear to auscultation without rales, wheezing or rhonchi  MUSCULOSKELETAL:  Generalized edema, edema to RLE; left BKA SKIN: Warm and dry NEUROLOGIC:  Alert and oriented x 3 PSYCHIATRIC:  Normal affect   ASSESSMENT:    1. Heart failure with mildly reduced ejection fraction (HFmrEF) (HCC)   2. Leg edema   3. Hypertension, unspecified type   4. Aortic dilatation (HCC)   5. History of CVA (cerebrovascular accident)   6. History of TIA (transient ischemic attack)   7. OSA (obstructive sleep apnea)    PLAN:    In order of problems listed above:  Chronic HFmrEF, leg edema TTE in 04/2022 revealed mildly reduced EF at 45 to 81%, mild diastolic dysfunction.  Dr. Domenic [name] suggested adding spironolactone at that time.  Will initiate low-dose Entresto and repeat BMET in 2 weeks per protocol.  Continue Lasix, hydralazine, metoprolol, and Aldactone.  Low-salt, heart healthy diet recommended.  Discussed to contact our office if he appears more swollen, as patient is unable to weigh himself on scale as he is wheelchair-bound.  Discussed fluid restrictions of less than 2000 mL daily.  He verbalized understanding.  Leg edema stable.  Continue current  medication regimen.  Continue follow-up with home health.  HTN Blood pressure on arrival, 146/84, repeat BP 136/82.  Will initiate Entresto as mentioned above.  Continue current medication regimen. Discussed to monitor BP at home at  least 2 hours after medications and sitting for 5-10 minutes.  Low-salt, heart healthy diet recommended.  3. Aortic dilatation Moderate dilatation of ascending aorta measured on echocardiogram in August 2023, measured 43 mm.  Recommend updating echocardiogram in 1 year.  4. Hx of stroke/TIA Remote history.  Denies any recent symptoms.  Continue Lipitor and Plavix.  5. OSA, refuses CPAP Encouraged compliance, however patient declines.  Risks discussed regarding noncompliance with CPAP.  He verbalized understanding.  Continue to follow with PCP.  6. Disposition: Follow-up with me in 2 months or sooner if anything changes.   Medication Adjustments/Labs and Tests Ordered: Current medicines are reviewed at length with the patient today.  Concerns regarding medicines are outlined above.  Orders Placed This Encounter  Procedures   Basic metabolic panel   Meds ordered this encounter  Medications   sacubitril-valsartan (ENTRESTO) 24-26 MG    Sig: Take 1 tablet by mouth 2 (two) times daily.    Dispense:  60 tablet    Refill:  11    Patient Instructions  Medication Instructions:  Your physician has recommended you make the following change in your medication:  -Start Entresto 24-26 mg tablets twice daily   Labwork: In 2 Weeks -BMET  Testing/Procedures: None  Follow-Up: Follow up with Finis Bud, NP in 2 months.   Any Other Special Instructions Will Be Listed Below (If Applicable).     If you need a refill on your cardiac medications before your next appointment, please call your pharmacy.    Signed, Finis Bud, NP  10/10/2022 1:11 PM    [place]

## 2022-10-09 NOTE — Patient Instructions (Signed)
Medication Instructions:  Your physician has recommended you make the following change in your medication:  -Start Entresto 24-26 mg tablets twice daily   Labwork: In 2 Weeks -BMET  Testing/Procedures: None  Follow-Up: Follow up with Finis Bud, NP in 2 months.   Any Other Special Instructions Will Be Listed Below (If Applicable).     If you need a refill on your cardiac medications before your next appointment, please call your pharmacy.

## 2022-10-10 ENCOUNTER — Encounter: Payer: Self-pay | Admitting: Nurse Practitioner

## 2022-10-13 DIAGNOSIS — Z89512 Acquired absence of left leg below knee: Secondary | ICD-10-CM | POA: Diagnosis not present

## 2022-10-13 DIAGNOSIS — L97212 Non-pressure chronic ulcer of right calf with fat layer exposed: Secondary | ICD-10-CM | POA: Diagnosis not present

## 2022-10-13 DIAGNOSIS — D649 Anemia, unspecified: Secondary | ICD-10-CM | POA: Diagnosis not present

## 2022-10-13 DIAGNOSIS — N200 Calculus of kidney: Secondary | ICD-10-CM | POA: Diagnosis not present

## 2022-10-13 DIAGNOSIS — L89892 Pressure ulcer of other site, stage 2: Secondary | ICD-10-CM | POA: Diagnosis not present

## 2022-10-13 DIAGNOSIS — Z8673 Personal history of transient ischemic attack (TIA), and cerebral infarction without residual deficits: Secondary | ICD-10-CM | POA: Diagnosis not present

## 2022-10-13 DIAGNOSIS — E669 Obesity, unspecified: Secondary | ICD-10-CM | POA: Diagnosis not present

## 2022-10-13 DIAGNOSIS — Z7982 Long term (current) use of aspirin: Secondary | ICD-10-CM | POA: Diagnosis not present

## 2022-10-13 DIAGNOSIS — I110 Hypertensive heart disease with heart failure: Secondary | ICD-10-CM | POA: Diagnosis not present

## 2022-10-13 DIAGNOSIS — Z7902 Long term (current) use of antithrombotics/antiplatelets: Secondary | ICD-10-CM | POA: Diagnosis not present

## 2022-10-13 DIAGNOSIS — I872 Venous insufficiency (chronic) (peripheral): Secondary | ICD-10-CM | POA: Diagnosis not present

## 2022-10-13 DIAGNOSIS — E1121 Type 2 diabetes mellitus with diabetic nephropathy: Secondary | ICD-10-CM | POA: Diagnosis not present

## 2022-10-13 DIAGNOSIS — Z794 Long term (current) use of insulin: Secondary | ICD-10-CM | POA: Diagnosis not present

## 2022-10-13 DIAGNOSIS — E7800 Pure hypercholesterolemia, unspecified: Secondary | ICD-10-CM | POA: Diagnosis not present

## 2022-10-13 DIAGNOSIS — I5030 Unspecified diastolic (congestive) heart failure: Secondary | ICD-10-CM | POA: Diagnosis not present

## 2022-10-17 DIAGNOSIS — I110 Hypertensive heart disease with heart failure: Secondary | ICD-10-CM | POA: Diagnosis not present

## 2022-10-17 DIAGNOSIS — L97212 Non-pressure chronic ulcer of right calf with fat layer exposed: Secondary | ICD-10-CM | POA: Diagnosis not present

## 2022-10-17 DIAGNOSIS — E1121 Type 2 diabetes mellitus with diabetic nephropathy: Secondary | ICD-10-CM | POA: Diagnosis not present

## 2022-10-17 DIAGNOSIS — L89892 Pressure ulcer of other site, stage 2: Secondary | ICD-10-CM | POA: Diagnosis not present

## 2022-10-17 DIAGNOSIS — I5030 Unspecified diastolic (congestive) heart failure: Secondary | ICD-10-CM | POA: Diagnosis not present

## 2022-10-17 DIAGNOSIS — I872 Venous insufficiency (chronic) (peripheral): Secondary | ICD-10-CM | POA: Diagnosis not present

## 2022-10-19 ENCOUNTER — Ambulatory Visit: Payer: Medicare Other | Admitting: Orthopedic Surgery

## 2022-10-19 DIAGNOSIS — E1121 Type 2 diabetes mellitus with diabetic nephropathy: Secondary | ICD-10-CM | POA: Diagnosis not present

## 2022-10-19 DIAGNOSIS — I5030 Unspecified diastolic (congestive) heart failure: Secondary | ICD-10-CM | POA: Diagnosis not present

## 2022-10-19 DIAGNOSIS — L97212 Non-pressure chronic ulcer of right calf with fat layer exposed: Secondary | ICD-10-CM | POA: Diagnosis not present

## 2022-10-19 DIAGNOSIS — L89892 Pressure ulcer of other site, stage 2: Secondary | ICD-10-CM | POA: Diagnosis not present

## 2022-10-19 DIAGNOSIS — I110 Hypertensive heart disease with heart failure: Secondary | ICD-10-CM | POA: Diagnosis not present

## 2022-10-19 DIAGNOSIS — I872 Venous insufficiency (chronic) (peripheral): Secondary | ICD-10-CM | POA: Diagnosis not present

## 2022-10-24 DIAGNOSIS — I872 Venous insufficiency (chronic) (peripheral): Secondary | ICD-10-CM | POA: Diagnosis not present

## 2022-10-24 DIAGNOSIS — L97212 Non-pressure chronic ulcer of right calf with fat layer exposed: Secondary | ICD-10-CM | POA: Diagnosis not present

## 2022-10-24 DIAGNOSIS — I110 Hypertensive heart disease with heart failure: Secondary | ICD-10-CM | POA: Diagnosis not present

## 2022-10-24 DIAGNOSIS — L89892 Pressure ulcer of other site, stage 2: Secondary | ICD-10-CM | POA: Diagnosis not present

## 2022-10-24 DIAGNOSIS — E1121 Type 2 diabetes mellitus with diabetic nephropathy: Secondary | ICD-10-CM | POA: Diagnosis not present

## 2022-10-24 DIAGNOSIS — I5030 Unspecified diastolic (congestive) heart failure: Secondary | ICD-10-CM | POA: Diagnosis not present

## 2022-10-26 DIAGNOSIS — I872 Venous insufficiency (chronic) (peripheral): Secondary | ICD-10-CM | POA: Diagnosis not present

## 2022-10-26 DIAGNOSIS — L89892 Pressure ulcer of other site, stage 2: Secondary | ICD-10-CM | POA: Diagnosis not present

## 2022-10-26 DIAGNOSIS — L97212 Non-pressure chronic ulcer of right calf with fat layer exposed: Secondary | ICD-10-CM | POA: Diagnosis not present

## 2022-10-26 DIAGNOSIS — E1121 Type 2 diabetes mellitus with diabetic nephropathy: Secondary | ICD-10-CM | POA: Diagnosis not present

## 2022-10-26 DIAGNOSIS — I5030 Unspecified diastolic (congestive) heart failure: Secondary | ICD-10-CM | POA: Diagnosis not present

## 2022-10-26 DIAGNOSIS — I110 Hypertensive heart disease with heart failure: Secondary | ICD-10-CM | POA: Diagnosis not present

## 2022-10-31 DIAGNOSIS — I5030 Unspecified diastolic (congestive) heart failure: Secondary | ICD-10-CM | POA: Diagnosis not present

## 2022-10-31 DIAGNOSIS — I110 Hypertensive heart disease with heart failure: Secondary | ICD-10-CM | POA: Diagnosis not present

## 2022-10-31 DIAGNOSIS — L97212 Non-pressure chronic ulcer of right calf with fat layer exposed: Secondary | ICD-10-CM | POA: Diagnosis not present

## 2022-10-31 DIAGNOSIS — E1121 Type 2 diabetes mellitus with diabetic nephropathy: Secondary | ICD-10-CM | POA: Diagnosis not present

## 2022-10-31 DIAGNOSIS — L89892 Pressure ulcer of other site, stage 2: Secondary | ICD-10-CM | POA: Diagnosis not present

## 2022-10-31 DIAGNOSIS — I872 Venous insufficiency (chronic) (peripheral): Secondary | ICD-10-CM | POA: Diagnosis not present

## 2022-11-02 ENCOUNTER — Ambulatory Visit (INDEPENDENT_AMBULATORY_CARE_PROVIDER_SITE_OTHER): Payer: Medicare Other | Admitting: Orthopedic Surgery

## 2022-11-02 DIAGNOSIS — L97919 Non-pressure chronic ulcer of unspecified part of right lower leg with unspecified severity: Secondary | ICD-10-CM

## 2022-11-02 DIAGNOSIS — Z89512 Acquired absence of left leg below knee: Secondary | ICD-10-CM | POA: Diagnosis not present

## 2022-11-02 DIAGNOSIS — I83019 Varicose veins of right lower extremity with ulcer of unspecified site: Secondary | ICD-10-CM | POA: Diagnosis not present

## 2022-11-03 ENCOUNTER — Encounter: Payer: Self-pay | Admitting: Orthopedic Surgery

## 2022-11-03 DIAGNOSIS — E1121 Type 2 diabetes mellitus with diabetic nephropathy: Secondary | ICD-10-CM | POA: Diagnosis not present

## 2022-11-03 DIAGNOSIS — I872 Venous insufficiency (chronic) (peripheral): Secondary | ICD-10-CM | POA: Diagnosis not present

## 2022-11-03 DIAGNOSIS — I110 Hypertensive heart disease with heart failure: Secondary | ICD-10-CM | POA: Diagnosis not present

## 2022-11-03 DIAGNOSIS — L97212 Non-pressure chronic ulcer of right calf with fat layer exposed: Secondary | ICD-10-CM | POA: Diagnosis not present

## 2022-11-03 DIAGNOSIS — L89892 Pressure ulcer of other site, stage 2: Secondary | ICD-10-CM | POA: Diagnosis not present

## 2022-11-03 DIAGNOSIS — I5030 Unspecified diastolic (congestive) heart failure: Secondary | ICD-10-CM | POA: Diagnosis not present

## 2022-11-03 NOTE — Progress Notes (Signed)
Office Visit Note   Patient: [name] [name]           Date of Birth: Nov 12, 1955           MRN: [id number] Visit Date: 11/02/2022              Requested by: Sasser, Silvestre Moment, MD [place],  [place] [address] PCP: Manon Hilding, MD  Chief Complaint  Patient presents with   Right Leg - Wound Check      HPI: Patient is a 67 year old gentleman who presents in follow-up for venous ulceration right leg and a left transtibial amputation.  Patient has been in a Dynaflex compression wrap.  The home health nursing is wrapping his leg once a week.  Assessment & Plan: Visit Diagnoses:  1. Left below-knee amputee ([place])   2. Venous ulcer of right leg (HCC)     Plan: Continue with the compression wraps.  Patient needs more compression across the the forefoot.  We will rewrap his leg today follow-up in 4 weeks.  Recommended exercise elevation compression and increase protein intake.  Follow-Up Instructions: Return in about 4 weeks (around 11/30/2022).   Ortho Exam  Patient is alert, oriented, no adenopathy, well-dressed, normal affect, normal respiratory effort. Examination patient has decreased swelling in the right leg however he does have significant swelling in the right forefoot.  There is a 5 mm ulcer in the lateral aspect of the right calf which is superficial.  Patient is currently on Lasix 40 mg a day aspirin and Plavix.  There is no cellulitis no signs of infection.  Imaging: No results found. No images are attached to the encounter.  Labs: Lab Results  Component Value Date   HGBA1C 7.3 (H) 10/17/2021   HGBA1C 6.5 (H) 09/12/2019   CRP 17.6 (H) 09/14/2019   CRP 14.7 (H) 09/13/2019   CRP 14.6 (H) 09/12/2019   REPTSTATUS 11/20/2021 FINAL 11/17/2021   CULT (A) 11/17/2021    >=100,000 COLONIES/mL ESCHERICHIA COLI >=100,000 COLONIES/mL KLEBSIELLA OXYTOCA    LABORGA ESCHERICHIA COLI (A) 11/17/2021   LABORGA KLEBSIELLA OXYTOCA (A) 11/17/2021     Lab Results  Component  Value Date   ALBUMIN 2.6 (L) 11/10/2021   ALBUMIN 1.8 (L) 10/25/2021   ALBUMIN 2.5 (L) 10/14/2021    Lab Results  Component Value Date   MG 2.1 09/14/2019   MG 2.3 09/13/2019   MG 1.9 09/12/2019   No results found for: "VD25OH"  No results found for: "PREALBUMIN"    Latest Ref Rng & Units 12/21/2021    9:53 AM 11/10/2021    6:54 AM 10/31/2021    5:00 AM  CBC EXTENDED  WBC 4.0 - 10.5 K/uL 7.6  6.5  7.9   RBC 4.22 - 5.81 Mil/uL 4.57  3.88  3.22   Hemoglobin 13.0 - 17.0 g/dL 11.4  9.5  7.8   HCT 39.0 - 52.0 % 35.6  33.0  26.5   Platelets 150.0 - 400.0 K/uL 230.0  176  243   NEUT# 1.4 - 7.7 K/uL 5.3  4.3    Lymph# 0.7 - 4.0 K/uL 0.9  0.8       There is no height or weight on file to calculate BMI.  Orders:  No orders of the defined types were placed in this encounter.  No orders of the defined types were placed in this encounter.    Procedures: No procedures performed  Clinical Data: No additional findings.  ROS:  All other  systems negative, except as noted in the HPI. Review of Systems  Objective: Vital Signs: There were no vitals taken for this visit.  Specialty Comments:  No specialty comments available.  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[place]) 10/15/2021   Anemia 10/15/2021   Pressure ulcer 10/15/2021   History of CVA (cerebrovascular accident) 10/15/2021   Septic arthritis of ankle and foot region ([place]) 10/15/2021   AKI (acute kidney injury) ([place]) 10/14/2021   Pneumonia due to COVID-19 virus 09/09/2019   Hypokalemia 09/09/2019   Acute respiratory failure with hypoxia ([place]) 09/09/2019   Essential hypertension 10/14/2014   Chronic  diastolic heart failure ([place]) 10/14/2014   Morbid obesity ([place]) 07/22/2013   Varicose veins of lower extremities with other complications [id number]   OSA (obstructive sleep apnea) 07/22/2013   Venous stasis ulcers ([place]) 07/22/2013   Diabetes mellitus ([place]) 01/03/2011   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[place])    Diverticulosis    Essential hypertension    Gastritis    History of nephrolithiasis    History of open leg wound    History of stroke 03/19/2003   Lymphedema    Obstructive sleep apnea    Peptic ulcer    TIA (transient ischemic attack)    Varicose veins    Laser ablation of left great saphenous vein 2009    Family History  Problem Relation Age of Onset   Bone cancer Mother    Hypertension Father    Pancreatic cancer Maternal Grandmother    Stroke Paternal Grandmother    Diabetes Paternal Grandfather    Heart attack Brother    Hypertension Brother    Heart attack Sister     Past Surgical History:  Procedure Laterality Date   AMPUTATION Left 10/21/2021   Procedure: LEFT BELOW KNEE AMPUTATION;  Surgeon: [name], MD;  Location: [facility];  Service: Orthopedics;  Laterality: Left;   BIOPSY  10/19/2021   Procedure: BIOPSY;  Surgeon: Sharyn Creamer, MD;  Location: [facility];  Service: Gastroenterology;;   COLONOSCOPY N/A 10/19/2021   Procedure: COLONOSCOPY;  Surgeon: Sharyn Creamer, MD;  Location: [facility];  Service: Gastroenterology;  Laterality: N/A;   CYSTOSCOPY WITH RETROGRADE PYELOGRAM, URETEROSCOPY AND STENT PLACEMENT Right 08/02/2015   Procedure: CYSTOSCOPY WITH RETROGRADE PYELOGRAM, URETEROSCOPY , STONE EXTRACTION AND STENT PLACEMENT;  Surgeon: Cleon Gustin, MD;  [facility];  Service: Urology;  Laterality: Right;   ESOPHAGOGASTRODUODENOSCOPY (EGD) WITH PROPOFOL N/A 10/19/2021   Procedure: ESOPHAGOGASTRODUODENOSCOPY (EGD) WITH PROPOFOL;  Surgeon: Sharyn Creamer, MD;  Location: [facility];  Service:  Gastroenterology;  Laterality: N/A;   HOLMIUM LASER APPLICATION Right [id number]   Procedure: HOLMIUM LASER APPLICATION;  Surgeon: Cleon Gustin, MD;  [facility];  Service: Urology;  Laterality: Right;   KIDNEY STONE SURGERY     POLYPECTOMY  10/19/2021   Procedure: POLYPECTOMY;  Surgeon: Sharyn Creamer, MD;  Location: [facility];  Service: Gastroenterology;;   [place] Left 06/2010   Laser ablation   Social History   Occupational History   Occupation: controll room  operator     Employer: [employer]  Tobacco Use   Smoking status: Never    Passive exposure: Never   Smokeless tobacco: Never  Vaping Use   Vaping Use: Never used  Substance and Sexual Activity   Alcohol use: No    Alcohol/week: 0.0 standard drinks of alcohol   Drug use: No   Sexual activity: Not on file

## 2022-11-05 ENCOUNTER — Other Ambulatory Visit: Payer: Self-pay | Admitting: Cardiology

## 2022-11-08 DIAGNOSIS — I872 Venous insufficiency (chronic) (peripheral): Secondary | ICD-10-CM | POA: Diagnosis not present

## 2022-11-08 DIAGNOSIS — L89892 Pressure ulcer of other site, stage 2: Secondary | ICD-10-CM | POA: Diagnosis not present

## 2022-11-08 DIAGNOSIS — E1121 Type 2 diabetes mellitus with diabetic nephropathy: Secondary | ICD-10-CM | POA: Diagnosis not present

## 2022-11-08 DIAGNOSIS — I110 Hypertensive heart disease with heart failure: Secondary | ICD-10-CM | POA: Diagnosis not present

## 2022-11-08 DIAGNOSIS — L97212 Non-pressure chronic ulcer of right calf with fat layer exposed: Secondary | ICD-10-CM | POA: Diagnosis not present

## 2022-11-08 DIAGNOSIS — I5030 Unspecified diastolic (congestive) heart failure: Secondary | ICD-10-CM | POA: Diagnosis not present

## 2022-11-09 DIAGNOSIS — I110 Hypertensive heart disease with heart failure: Secondary | ICD-10-CM | POA: Diagnosis not present

## 2022-11-09 DIAGNOSIS — I5030 Unspecified diastolic (congestive) heart failure: Secondary | ICD-10-CM | POA: Diagnosis not present

## 2022-11-09 DIAGNOSIS — I872 Venous insufficiency (chronic) (peripheral): Secondary | ICD-10-CM | POA: Diagnosis not present

## 2022-11-09 DIAGNOSIS — E1121 Type 2 diabetes mellitus with diabetic nephropathy: Secondary | ICD-10-CM | POA: Diagnosis not present

## 2022-11-09 DIAGNOSIS — L89892 Pressure ulcer of other site, stage 2: Secondary | ICD-10-CM | POA: Diagnosis not present

## 2022-11-09 DIAGNOSIS — L97212 Non-pressure chronic ulcer of right calf with fat layer exposed: Secondary | ICD-10-CM | POA: Diagnosis not present

## 2022-11-12 DIAGNOSIS — E669 Obesity, unspecified: Secondary | ICD-10-CM | POA: Diagnosis not present

## 2022-11-12 DIAGNOSIS — L97212 Non-pressure chronic ulcer of right calf with fat layer exposed: Secondary | ICD-10-CM | POA: Diagnosis not present

## 2022-11-12 DIAGNOSIS — Z89512 Acquired absence of left leg below knee: Secondary | ICD-10-CM | POA: Diagnosis not present

## 2022-11-12 DIAGNOSIS — I5030 Unspecified diastolic (congestive) heart failure: Secondary | ICD-10-CM | POA: Diagnosis not present

## 2022-11-12 DIAGNOSIS — I110 Hypertensive heart disease with heart failure: Secondary | ICD-10-CM | POA: Diagnosis not present

## 2022-11-12 DIAGNOSIS — Z8673 Personal history of transient ischemic attack (TIA), and cerebral infarction without residual deficits: Secondary | ICD-10-CM | POA: Diagnosis not present

## 2022-11-12 DIAGNOSIS — Z7902 Long term (current) use of antithrombotics/antiplatelets: Secondary | ICD-10-CM | POA: Diagnosis not present

## 2022-11-12 DIAGNOSIS — D649 Anemia, unspecified: Secondary | ICD-10-CM | POA: Diagnosis not present

## 2022-11-12 DIAGNOSIS — E7800 Pure hypercholesterolemia, unspecified: Secondary | ICD-10-CM | POA: Diagnosis not present

## 2022-11-12 DIAGNOSIS — E1121 Type 2 diabetes mellitus with diabetic nephropathy: Secondary | ICD-10-CM | POA: Diagnosis not present

## 2022-11-12 DIAGNOSIS — I872 Venous insufficiency (chronic) (peripheral): Secondary | ICD-10-CM | POA: Diagnosis not present

## 2022-11-12 DIAGNOSIS — Z794 Long term (current) use of insulin: Secondary | ICD-10-CM | POA: Diagnosis not present

## 2022-11-12 DIAGNOSIS — Z7982 Long term (current) use of aspirin: Secondary | ICD-10-CM | POA: Diagnosis not present

## 2022-11-12 DIAGNOSIS — N200 Calculus of kidney: Secondary | ICD-10-CM | POA: Diagnosis not present

## 2022-11-14 ENCOUNTER — Telehealth: Payer: Self-pay | Admitting: Cardiology

## 2022-11-14 DIAGNOSIS — I110 Hypertensive heart disease with heart failure: Secondary | ICD-10-CM | POA: Diagnosis not present

## 2022-11-14 DIAGNOSIS — D649 Anemia, unspecified: Secondary | ICD-10-CM | POA: Diagnosis not present

## 2022-11-14 DIAGNOSIS — I5030 Unspecified diastolic (congestive) heart failure: Secondary | ICD-10-CM | POA: Diagnosis not present

## 2022-11-14 DIAGNOSIS — I872 Venous insufficiency (chronic) (peripheral): Secondary | ICD-10-CM | POA: Diagnosis not present

## 2022-11-14 DIAGNOSIS — L97212 Non-pressure chronic ulcer of right calf with fat layer exposed: Secondary | ICD-10-CM | POA: Diagnosis not present

## 2022-11-14 DIAGNOSIS — E1121 Type 2 diabetes mellitus with diabetic nephropathy: Secondary | ICD-10-CM | POA: Diagnosis not present

## 2022-11-14 NOTE — Telephone Encounter (Signed)
Pt c/o medication issue:  1. Name of Medication: sacubitril-valsartan (ENTRESTO) 24-26 MG   2. How are you currently taking this medication (dosage and times per day)?    3. Are you having a reaction (difficulty breathing--STAT)? no  4. What is your medication issue? Patient states the medication that he was just put on cost $600. And he can't afford that, calling to see what other options are there. Please advise

## 2022-11-15 NOTE — Telephone Encounter (Signed)
Says he never received 30 day free voucher for entresto. 30 day voucher faxed to [facility] in [place]. Also, gave number to call [employer] PAF [phone] for patient assistance.

## 2022-11-16 ENCOUNTER — Telehealth: Payer: Self-pay | Admitting: Orthopedic Surgery

## 2022-11-16 DIAGNOSIS — I5030 Unspecified diastolic (congestive) heart failure: Secondary | ICD-10-CM | POA: Diagnosis not present

## 2022-11-16 DIAGNOSIS — I872 Venous insufficiency (chronic) (peripheral): Secondary | ICD-10-CM | POA: Diagnosis not present

## 2022-11-16 DIAGNOSIS — D649 Anemia, unspecified: Secondary | ICD-10-CM | POA: Diagnosis not present

## 2022-11-16 DIAGNOSIS — E1121 Type 2 diabetes mellitus with diabetic nephropathy: Secondary | ICD-10-CM | POA: Diagnosis not present

## 2022-11-16 DIAGNOSIS — I110 Hypertensive heart disease with heart failure: Secondary | ICD-10-CM | POA: Diagnosis not present

## 2022-11-16 DIAGNOSIS — L97212 Non-pressure chronic ulcer of right calf with fat layer exposed: Secondary | ICD-10-CM | POA: Diagnosis not present

## 2022-11-16 NOTE — Telephone Encounter (Signed)
Called pt 1X and left vm. Dr Sharol Given will not be in office on pt's appt 3/26 and pt need to reschedule

## 2022-11-17 DIAGNOSIS — D649 Anemia, unspecified: Secondary | ICD-10-CM | POA: Diagnosis not present

## 2022-11-17 DIAGNOSIS — I5030 Unspecified diastolic (congestive) heart failure: Secondary | ICD-10-CM | POA: Diagnosis not present

## 2022-11-17 DIAGNOSIS — E1121 Type 2 diabetes mellitus with diabetic nephropathy: Secondary | ICD-10-CM | POA: Diagnosis not present

## 2022-11-17 DIAGNOSIS — I110 Hypertensive heart disease with heart failure: Secondary | ICD-10-CM | POA: Diagnosis not present

## 2022-11-17 DIAGNOSIS — L97212 Non-pressure chronic ulcer of right calf with fat layer exposed: Secondary | ICD-10-CM | POA: Diagnosis not present

## 2022-11-17 DIAGNOSIS — I872 Venous insufficiency (chronic) (peripheral): Secondary | ICD-10-CM | POA: Diagnosis not present

## 2022-11-21 DIAGNOSIS — I5030 Unspecified diastolic (congestive) heart failure: Secondary | ICD-10-CM | POA: Diagnosis not present

## 2022-11-21 DIAGNOSIS — L97212 Non-pressure chronic ulcer of right calf with fat layer exposed: Secondary | ICD-10-CM | POA: Diagnosis not present

## 2022-11-21 DIAGNOSIS — I110 Hypertensive heart disease with heart failure: Secondary | ICD-10-CM | POA: Diagnosis not present

## 2022-11-21 DIAGNOSIS — E1121 Type 2 diabetes mellitus with diabetic nephropathy: Secondary | ICD-10-CM | POA: Diagnosis not present

## 2022-11-21 DIAGNOSIS — I872 Venous insufficiency (chronic) (peripheral): Secondary | ICD-10-CM | POA: Diagnosis not present

## 2022-11-21 DIAGNOSIS — D649 Anemia, unspecified: Secondary | ICD-10-CM | POA: Diagnosis not present

## 2022-11-23 DIAGNOSIS — I110 Hypertensive heart disease with heart failure: Secondary | ICD-10-CM | POA: Diagnosis not present

## 2022-11-23 DIAGNOSIS — I5030 Unspecified diastolic (congestive) heart failure: Secondary | ICD-10-CM | POA: Diagnosis not present

## 2022-11-23 DIAGNOSIS — L97212 Non-pressure chronic ulcer of right calf with fat layer exposed: Secondary | ICD-10-CM | POA: Diagnosis not present

## 2022-11-23 DIAGNOSIS — I872 Venous insufficiency (chronic) (peripheral): Secondary | ICD-10-CM | POA: Diagnosis not present

## 2022-11-23 DIAGNOSIS — E1121 Type 2 diabetes mellitus with diabetic nephropathy: Secondary | ICD-10-CM | POA: Diagnosis not present

## 2022-11-23 DIAGNOSIS — D649 Anemia, unspecified: Secondary | ICD-10-CM | POA: Diagnosis not present

## 2022-11-24 DIAGNOSIS — I5030 Unspecified diastolic (congestive) heart failure: Secondary | ICD-10-CM | POA: Diagnosis not present

## 2022-11-24 DIAGNOSIS — I110 Hypertensive heart disease with heart failure: Secondary | ICD-10-CM | POA: Diagnosis not present

## 2022-11-24 DIAGNOSIS — L97212 Non-pressure chronic ulcer of right calf with fat layer exposed: Secondary | ICD-10-CM | POA: Diagnosis not present

## 2022-11-24 DIAGNOSIS — E1121 Type 2 diabetes mellitus with diabetic nephropathy: Secondary | ICD-10-CM | POA: Diagnosis not present

## 2022-11-24 DIAGNOSIS — I872 Venous insufficiency (chronic) (peripheral): Secondary | ICD-10-CM | POA: Diagnosis not present

## 2022-11-24 DIAGNOSIS — D649 Anemia, unspecified: Secondary | ICD-10-CM | POA: Diagnosis not present

## 2022-11-28 DIAGNOSIS — L97212 Non-pressure chronic ulcer of right calf with fat layer exposed: Secondary | ICD-10-CM | POA: Diagnosis not present

## 2022-11-28 DIAGNOSIS — I5030 Unspecified diastolic (congestive) heart failure: Secondary | ICD-10-CM | POA: Diagnosis not present

## 2022-11-28 DIAGNOSIS — I872 Venous insufficiency (chronic) (peripheral): Secondary | ICD-10-CM | POA: Diagnosis not present

## 2022-11-28 DIAGNOSIS — E1121 Type 2 diabetes mellitus with diabetic nephropathy: Secondary | ICD-10-CM | POA: Diagnosis not present

## 2022-11-28 DIAGNOSIS — I110 Hypertensive heart disease with heart failure: Secondary | ICD-10-CM | POA: Diagnosis not present

## 2022-11-28 DIAGNOSIS — D649 Anemia, unspecified: Secondary | ICD-10-CM | POA: Diagnosis not present

## 2022-11-30 DIAGNOSIS — I110 Hypertensive heart disease with heart failure: Secondary | ICD-10-CM | POA: Diagnosis not present

## 2022-11-30 DIAGNOSIS — E7800 Pure hypercholesterolemia, unspecified: Secondary | ICD-10-CM | POA: Diagnosis not present

## 2022-11-30 DIAGNOSIS — L97212 Non-pressure chronic ulcer of right calf with fat layer exposed: Secondary | ICD-10-CM | POA: Diagnosis not present

## 2022-11-30 DIAGNOSIS — E669 Obesity, unspecified: Secondary | ICD-10-CM | POA: Diagnosis not present

## 2022-11-30 DIAGNOSIS — I5030 Unspecified diastolic (congestive) heart failure: Secondary | ICD-10-CM | POA: Diagnosis not present

## 2022-11-30 DIAGNOSIS — D649 Anemia, unspecified: Secondary | ICD-10-CM | POA: Diagnosis not present

## 2022-11-30 DIAGNOSIS — I872 Venous insufficiency (chronic) (peripheral): Secondary | ICD-10-CM | POA: Diagnosis not present

## 2022-11-30 DIAGNOSIS — E1121 Type 2 diabetes mellitus with diabetic nephropathy: Secondary | ICD-10-CM | POA: Diagnosis not present

## 2022-12-01 DIAGNOSIS — I110 Hypertensive heart disease with heart failure: Secondary | ICD-10-CM | POA: Diagnosis not present

## 2022-12-01 DIAGNOSIS — L97212 Non-pressure chronic ulcer of right calf with fat layer exposed: Secondary | ICD-10-CM | POA: Diagnosis not present

## 2022-12-01 DIAGNOSIS — E1121 Type 2 diabetes mellitus with diabetic nephropathy: Secondary | ICD-10-CM | POA: Diagnosis not present

## 2022-12-01 DIAGNOSIS — I5030 Unspecified diastolic (congestive) heart failure: Secondary | ICD-10-CM | POA: Diagnosis not present

## 2022-12-01 DIAGNOSIS — I872 Venous insufficiency (chronic) (peripheral): Secondary | ICD-10-CM | POA: Diagnosis not present

## 2022-12-01 DIAGNOSIS — D649 Anemia, unspecified: Secondary | ICD-10-CM | POA: Diagnosis not present

## 2022-12-03 DIAGNOSIS — R0902 Hypoxemia: Secondary | ICD-10-CM | POA: Diagnosis not present

## 2022-12-03 DIAGNOSIS — R509 Fever, unspecified: Secondary | ICD-10-CM | POA: Diagnosis not present

## 2022-12-03 DIAGNOSIS — I10 Essential (primary) hypertension: Secondary | ICD-10-CM | POA: Diagnosis not present

## 2022-12-03 DIAGNOSIS — R110 Nausea: Secondary | ICD-10-CM | POA: Diagnosis not present

## 2022-12-04 DIAGNOSIS — K760 Fatty (change of) liver, not elsewhere classified: Secondary | ICD-10-CM | POA: Diagnosis present

## 2022-12-04 DIAGNOSIS — Z89512 Acquired absence of left leg below knee: Secondary | ICD-10-CM | POA: Diagnosis not present

## 2022-12-04 DIAGNOSIS — Z6841 Body Mass Index (BMI) 40.0 and over, adult: Secondary | ICD-10-CM | POA: Diagnosis not present

## 2022-12-04 DIAGNOSIS — R509 Fever, unspecified: Secondary | ICD-10-CM | POA: Diagnosis not present

## 2022-12-04 DIAGNOSIS — I5021 Acute systolic (congestive) heart failure: Secondary | ICD-10-CM | POA: Diagnosis not present

## 2022-12-04 DIAGNOSIS — J9811 Atelectasis: Secondary | ICD-10-CM | POA: Diagnosis not present

## 2022-12-04 DIAGNOSIS — E1142 Type 2 diabetes mellitus with diabetic polyneuropathy: Secondary | ICD-10-CM | POA: Diagnosis present

## 2022-12-04 DIAGNOSIS — I69351 Hemiplegia and hemiparesis following cerebral infarction affecting right dominant side: Secondary | ICD-10-CM | POA: Diagnosis not present

## 2022-12-04 DIAGNOSIS — R0902 Hypoxemia: Secondary | ICD-10-CM | POA: Diagnosis not present

## 2022-12-04 DIAGNOSIS — Z20822 Contact with and (suspected) exposure to covid-19: Secondary | ICD-10-CM | POA: Diagnosis not present

## 2022-12-04 DIAGNOSIS — E785 Hyperlipidemia, unspecified: Secondary | ICD-10-CM | POA: Diagnosis not present

## 2022-12-04 DIAGNOSIS — I700 Atherosclerosis of aorta: Secondary | ICD-10-CM | POA: Diagnosis not present

## 2022-12-04 DIAGNOSIS — M109 Gout, unspecified: Secondary | ICD-10-CM | POA: Diagnosis not present

## 2022-12-04 DIAGNOSIS — S81801A Unspecified open wound, right lower leg, initial encounter: Secondary | ICD-10-CM | POA: Diagnosis not present

## 2022-12-04 DIAGNOSIS — I110 Hypertensive heart disease with heart failure: Secondary | ICD-10-CM | POA: Diagnosis present

## 2022-12-04 DIAGNOSIS — R82998 Other abnormal findings in urine: Secondary | ICD-10-CM | POA: Diagnosis not present

## 2022-12-04 DIAGNOSIS — Z79899 Other long term (current) drug therapy: Secondary | ICD-10-CM | POA: Diagnosis not present

## 2022-12-04 DIAGNOSIS — A419 Sepsis, unspecified organism: Secondary | ICD-10-CM | POA: Diagnosis present

## 2022-12-04 DIAGNOSIS — L03115 Cellulitis of right lower limb: Secondary | ICD-10-CM | POA: Diagnosis present

## 2022-12-04 DIAGNOSIS — I5033 Acute on chronic diastolic (congestive) heart failure: Secondary | ICD-10-CM | POA: Diagnosis present

## 2022-12-04 DIAGNOSIS — Z7902 Long term (current) use of antithrombotics/antiplatelets: Secondary | ICD-10-CM | POA: Diagnosis not present

## 2022-12-04 DIAGNOSIS — R059 Cough, unspecified: Secondary | ICD-10-CM | POA: Diagnosis not present

## 2022-12-04 DIAGNOSIS — J9601 Acute respiratory failure with hypoxia: Secondary | ICD-10-CM | POA: Diagnosis present

## 2022-12-04 DIAGNOSIS — Z1152 Encounter for screening for COVID-19: Secondary | ICD-10-CM | POA: Diagnosis not present

## 2022-12-04 DIAGNOSIS — I2510 Atherosclerotic heart disease of native coronary artery without angina pectoris: Secondary | ICD-10-CM | POA: Diagnosis not present

## 2022-12-04 DIAGNOSIS — N390 Urinary tract infection, site not specified: Secondary | ICD-10-CM | POA: Diagnosis present

## 2022-12-04 DIAGNOSIS — R338 Other retention of urine: Secondary | ICD-10-CM | POA: Diagnosis not present

## 2022-12-04 DIAGNOSIS — I440 Atrioventricular block, first degree: Secondary | ICD-10-CM | POA: Diagnosis present

## 2022-12-08 DIAGNOSIS — I110 Hypertensive heart disease with heart failure: Secondary | ICD-10-CM | POA: Diagnosis not present

## 2022-12-08 DIAGNOSIS — L97212 Non-pressure chronic ulcer of right calf with fat layer exposed: Secondary | ICD-10-CM | POA: Diagnosis not present

## 2022-12-08 DIAGNOSIS — I872 Venous insufficiency (chronic) (peripheral): Secondary | ICD-10-CM | POA: Diagnosis not present

## 2022-12-08 DIAGNOSIS — E1121 Type 2 diabetes mellitus with diabetic nephropathy: Secondary | ICD-10-CM | POA: Diagnosis not present

## 2022-12-08 DIAGNOSIS — D649 Anemia, unspecified: Secondary | ICD-10-CM | POA: Diagnosis not present

## 2022-12-08 DIAGNOSIS — I5030 Unspecified diastolic (congestive) heart failure: Secondary | ICD-10-CM | POA: Diagnosis not present

## 2022-12-11 ENCOUNTER — Telehealth: Payer: Self-pay | Admitting: Nurse Practitioner

## 2022-12-11 DIAGNOSIS — E1121 Type 2 diabetes mellitus with diabetic nephropathy: Secondary | ICD-10-CM | POA: Diagnosis not present

## 2022-12-11 DIAGNOSIS — I5030 Unspecified diastolic (congestive) heart failure: Secondary | ICD-10-CM | POA: Diagnosis not present

## 2022-12-11 DIAGNOSIS — L97212 Non-pressure chronic ulcer of right calf with fat layer exposed: Secondary | ICD-10-CM | POA: Diagnosis not present

## 2022-12-11 DIAGNOSIS — I110 Hypertensive heart disease with heart failure: Secondary | ICD-10-CM | POA: Diagnosis not present

## 2022-12-11 DIAGNOSIS — D649 Anemia, unspecified: Secondary | ICD-10-CM | POA: Diagnosis not present

## 2022-12-11 DIAGNOSIS — I872 Venous insufficiency (chronic) (peripheral): Secondary | ICD-10-CM | POA: Diagnosis not present

## 2022-12-11 NOTE — Telephone Encounter (Signed)
Patient is returning call. Please advise? 

## 2022-12-11 NOTE — Telephone Encounter (Signed)
Says pharmacy was unable to fill 30 day free entresto due to a hack in their system Advised that the hack problem has resolved and he should be able to get prescription now. Also reminded of last phone call conversation ([facility] day voucher faxed to [facility] in [place]. Also, gave number to call [employer] PAF [phone] for patient assistance). Says he never contacted [employer] PA[employer]. [employer] number again. Says he will request entresto rx from [facility] [place] and call [employer]. Appointment rescheduled per his request.

## 2022-12-11 NOTE — Telephone Encounter (Signed)
   Pt c/o medication issue:  1. Name of Medication:   sacubitril-valsartan (ENTRESTO) 24-26 MG    2. How are you currently taking this medication (dosage and times per day)? Take 1 tablet by mouth 2 (two) times daily.   3. Are you having a reaction (difficulty breathing--STAT)? No   4. What is your medication issue? Pt said, he couldn't get this medication because its too expensive and he still can't use the coupon. He would like to know what will be the next step and if he needs to r/s his appt with Benjamine Mola since he haven't started this medication

## 2022-12-12 ENCOUNTER — Ambulatory Visit: Payer: Medicare Other | Admitting: Nurse Practitioner

## 2022-12-12 ENCOUNTER — Ambulatory Visit: Payer: Medicare Other | Admitting: Orthopedic Surgery

## 2022-12-12 DIAGNOSIS — I5033 Acute on chronic diastolic (congestive) heart failure: Secondary | ICD-10-CM | POA: Diagnosis not present

## 2022-12-12 DIAGNOSIS — I872 Venous insufficiency (chronic) (peripheral): Secondary | ICD-10-CM | POA: Diagnosis not present

## 2022-12-12 DIAGNOSIS — N200 Calculus of kidney: Secondary | ICD-10-CM | POA: Diagnosis not present

## 2022-12-12 DIAGNOSIS — N390 Urinary tract infection, site not specified: Secondary | ICD-10-CM | POA: Diagnosis not present

## 2022-12-12 DIAGNOSIS — Z7902 Long term (current) use of antithrombotics/antiplatelets: Secondary | ICD-10-CM | POA: Diagnosis not present

## 2022-12-12 DIAGNOSIS — L97212 Non-pressure chronic ulcer of right calf with fat layer exposed: Secondary | ICD-10-CM | POA: Diagnosis not present

## 2022-12-12 DIAGNOSIS — Z794 Long term (current) use of insulin: Secondary | ICD-10-CM | POA: Diagnosis not present

## 2022-12-12 DIAGNOSIS — E7800 Pure hypercholesterolemia, unspecified: Secondary | ICD-10-CM | POA: Diagnosis not present

## 2022-12-12 DIAGNOSIS — Z7982 Long term (current) use of aspirin: Secondary | ICD-10-CM | POA: Diagnosis not present

## 2022-12-12 DIAGNOSIS — I110 Hypertensive heart disease with heart failure: Secondary | ICD-10-CM | POA: Diagnosis not present

## 2022-12-12 DIAGNOSIS — Z8673 Personal history of transient ischemic attack (TIA), and cerebral infarction without residual deficits: Secondary | ICD-10-CM | POA: Diagnosis not present

## 2022-12-12 DIAGNOSIS — Z89512 Acquired absence of left leg below knee: Secondary | ICD-10-CM | POA: Diagnosis not present

## 2022-12-12 DIAGNOSIS — D649 Anemia, unspecified: Secondary | ICD-10-CM | POA: Diagnosis not present

## 2022-12-12 DIAGNOSIS — J9601 Acute respiratory failure with hypoxia: Secondary | ICD-10-CM | POA: Diagnosis not present

## 2022-12-12 DIAGNOSIS — E1121 Type 2 diabetes mellitus with diabetic nephropathy: Secondary | ICD-10-CM | POA: Diagnosis not present

## 2022-12-14 ENCOUNTER — Ambulatory Visit: Payer: Medicare Other | Admitting: Orthopedic Surgery

## 2022-12-18 DIAGNOSIS — J9601 Acute respiratory failure with hypoxia: Secondary | ICD-10-CM | POA: Diagnosis not present

## 2022-12-18 DIAGNOSIS — I110 Hypertensive heart disease with heart failure: Secondary | ICD-10-CM | POA: Diagnosis not present

## 2022-12-18 DIAGNOSIS — E1121 Type 2 diabetes mellitus with diabetic nephropathy: Secondary | ICD-10-CM | POA: Diagnosis not present

## 2022-12-18 DIAGNOSIS — L97212 Non-pressure chronic ulcer of right calf with fat layer exposed: Secondary | ICD-10-CM | POA: Diagnosis not present

## 2022-12-18 DIAGNOSIS — N390 Urinary tract infection, site not specified: Secondary | ICD-10-CM | POA: Diagnosis not present

## 2022-12-18 DIAGNOSIS — I872 Venous insufficiency (chronic) (peripheral): Secondary | ICD-10-CM | POA: Diagnosis not present

## 2022-12-19 ENCOUNTER — Ambulatory Visit: Payer: Medicare Other | Admitting: Orthopedic Surgery

## 2022-12-21 DIAGNOSIS — I872 Venous insufficiency (chronic) (peripheral): Secondary | ICD-10-CM | POA: Diagnosis not present

## 2022-12-21 DIAGNOSIS — N390 Urinary tract infection, site not specified: Secondary | ICD-10-CM | POA: Diagnosis not present

## 2022-12-21 DIAGNOSIS — E1121 Type 2 diabetes mellitus with diabetic nephropathy: Secondary | ICD-10-CM | POA: Diagnosis not present

## 2022-12-21 DIAGNOSIS — J9601 Acute respiratory failure with hypoxia: Secondary | ICD-10-CM | POA: Diagnosis not present

## 2022-12-21 DIAGNOSIS — I110 Hypertensive heart disease with heart failure: Secondary | ICD-10-CM | POA: Diagnosis not present

## 2022-12-21 DIAGNOSIS — D649 Anemia, unspecified: Secondary | ICD-10-CM | POA: Diagnosis not present

## 2022-12-21 DIAGNOSIS — L97212 Non-pressure chronic ulcer of right calf with fat layer exposed: Secondary | ICD-10-CM | POA: Diagnosis not present

## 2022-12-21 DIAGNOSIS — I5033 Acute on chronic diastolic (congestive) heart failure: Secondary | ICD-10-CM | POA: Diagnosis not present

## 2022-12-25 DIAGNOSIS — N390 Urinary tract infection, site not specified: Secondary | ICD-10-CM | POA: Diagnosis not present

## 2022-12-25 DIAGNOSIS — I110 Hypertensive heart disease with heart failure: Secondary | ICD-10-CM | POA: Diagnosis not present

## 2022-12-25 DIAGNOSIS — L97212 Non-pressure chronic ulcer of right calf with fat layer exposed: Secondary | ICD-10-CM | POA: Diagnosis not present

## 2022-12-25 DIAGNOSIS — J9601 Acute respiratory failure with hypoxia: Secondary | ICD-10-CM | POA: Diagnosis not present

## 2022-12-25 DIAGNOSIS — E1121 Type 2 diabetes mellitus with diabetic nephropathy: Secondary | ICD-10-CM | POA: Diagnosis not present

## 2022-12-25 DIAGNOSIS — I872 Venous insufficiency (chronic) (peripheral): Secondary | ICD-10-CM | POA: Diagnosis not present

## 2022-12-26 ENCOUNTER — Ambulatory Visit (INDEPENDENT_AMBULATORY_CARE_PROVIDER_SITE_OTHER): Payer: Medicare Other | Admitting: Orthopedic Surgery

## 2022-12-26 DIAGNOSIS — Z89512 Acquired absence of left leg below knee: Secondary | ICD-10-CM

## 2022-12-26 DIAGNOSIS — L97919 Non-pressure chronic ulcer of unspecified part of right lower leg with unspecified severity: Secondary | ICD-10-CM | POA: Diagnosis not present

## 2022-12-26 DIAGNOSIS — I83019 Varicose veins of right lower extremity with ulcer of unspecified site: Secondary | ICD-10-CM | POA: Diagnosis not present

## 2023-01-01 DIAGNOSIS — I110 Hypertensive heart disease with heart failure: Secondary | ICD-10-CM | POA: Diagnosis not present

## 2023-01-01 DIAGNOSIS — E1121 Type 2 diabetes mellitus with diabetic nephropathy: Secondary | ICD-10-CM | POA: Diagnosis not present

## 2023-01-01 DIAGNOSIS — L97212 Non-pressure chronic ulcer of right calf with fat layer exposed: Secondary | ICD-10-CM | POA: Diagnosis not present

## 2023-01-01 DIAGNOSIS — I872 Venous insufficiency (chronic) (peripheral): Secondary | ICD-10-CM | POA: Diagnosis not present

## 2023-01-01 DIAGNOSIS — N390 Urinary tract infection, site not specified: Secondary | ICD-10-CM | POA: Diagnosis not present

## 2023-01-01 DIAGNOSIS — J9601 Acute respiratory failure with hypoxia: Secondary | ICD-10-CM | POA: Diagnosis not present

## 2023-01-03 DIAGNOSIS — I872 Venous insufficiency (chronic) (peripheral): Secondary | ICD-10-CM | POA: Diagnosis not present

## 2023-01-03 DIAGNOSIS — J9601 Acute respiratory failure with hypoxia: Secondary | ICD-10-CM | POA: Diagnosis not present

## 2023-01-03 DIAGNOSIS — N390 Urinary tract infection, site not specified: Secondary | ICD-10-CM | POA: Diagnosis not present

## 2023-01-03 DIAGNOSIS — E1121 Type 2 diabetes mellitus with diabetic nephropathy: Secondary | ICD-10-CM | POA: Diagnosis not present

## 2023-01-03 DIAGNOSIS — I110 Hypertensive heart disease with heart failure: Secondary | ICD-10-CM | POA: Diagnosis not present

## 2023-01-03 DIAGNOSIS — L97212 Non-pressure chronic ulcer of right calf with fat layer exposed: Secondary | ICD-10-CM | POA: Diagnosis not present

## 2023-01-04 DIAGNOSIS — A419 Sepsis, unspecified organism: Secondary | ICD-10-CM | POA: Diagnosis not present

## 2023-01-04 DIAGNOSIS — N390 Urinary tract infection, site not specified: Secondary | ICD-10-CM | POA: Diagnosis not present

## 2023-01-04 DIAGNOSIS — I110 Hypertensive heart disease with heart failure: Secondary | ICD-10-CM | POA: Diagnosis not present

## 2023-01-04 DIAGNOSIS — E1121 Type 2 diabetes mellitus with diabetic nephropathy: Secondary | ICD-10-CM | POA: Diagnosis not present

## 2023-01-04 DIAGNOSIS — N200 Calculus of kidney: Secondary | ICD-10-CM | POA: Diagnosis not present

## 2023-01-04 DIAGNOSIS — I5030 Unspecified diastolic (congestive) heart failure: Secondary | ICD-10-CM | POA: Diagnosis not present

## 2023-01-04 DIAGNOSIS — J9601 Acute respiratory failure with hypoxia: Secondary | ICD-10-CM | POA: Diagnosis not present

## 2023-01-04 DIAGNOSIS — L97212 Non-pressure chronic ulcer of right calf with fat layer exposed: Secondary | ICD-10-CM | POA: Diagnosis not present

## 2023-01-04 DIAGNOSIS — I872 Venous insufficiency (chronic) (peripheral): Secondary | ICD-10-CM | POA: Diagnosis not present

## 2023-01-07 ENCOUNTER — Encounter: Payer: Self-pay | Admitting: Orthopedic Surgery

## 2023-01-07 NOTE — Progress Notes (Signed)
Office Visit Note   Patient: [name] [name]           Date of Birth: [birth year]           MRN: [id number] Visit Date: 12/26/2022              Requested by: Sasser, Clarene Critchley, MD [address] [place],  [place] [address] PCP: Estanislado Pandy, MD  Chief Complaint  Patient presents with   Left Leg - Follow-up    10/21/2021 left BKA       HPI: Patient is a 67 year old gentleman who presents for left below-knee amputation and right venous insufficiency.  Patient has been undergoing compression.  Patient has been instructed with exercise elevation and increase protein intake.  Patient states he is recently been hospitalized for a UTI.  Assessment & Plan: Visit Diagnoses:  1. Left below-knee amputee   2. Venous ulcer of right leg     Plan: Patient will continue with compression wraps.  We will change the wraps to 2 times a week.  Follow-Up Instructions: Return in about 4 weeks (around 01/23/2023).   Ortho Exam  Patient is alert, oriented, no adenopathy, well-dressed, normal affect, normal respiratory effort. Examination patient has decreased swelling in the right leg however there is still significant pitting edema with brawny skin color changes.  There is no cellulitis no odor drainage or ulcers.  Imaging: No results found. No images are attached to the encounter.  Labs: Lab Results  Component Value Date   HGBA1C 7.3 (H) 10/17/2021   HGBA1C 6.5 (H) 09/12/2019   CRP 17.6 (H) 09/14/2019   CRP 14.7 (H) 09/13/2019   CRP 14.6 (H) 09/12/2019   REPTSTATUS 11/20/2021 FINAL 11/17/2021   CULT (A) 11/17/2021    >=100,000 COLONIES/mL ESCHERICHIA COLI >=100,000 COLONIES/mL KLEBSIELLA OXYTOCA    LABORGA ESCHERICHIA COLI (A) 11/17/2021   LABORGA KLEBSIELLA OXYTOCA (A) 11/17/2021     Lab Results  Component Value Date   ALBUMIN 2.6 (L) 11/10/2021   ALBUMIN 1.8 (L) 10/25/2021   ALBUMIN 2.5 (L) 10/14/2021    Lab Results  Component Value Date   MG 2.1 09/14/2019   MG 2.3  09/13/2019   MG 1.9 09/12/2019   No results found for: "VD25OH"  No results found for: "PREALBUMIN"    Latest Ref Rng & Units 12/21/2021    9:53 AM 11/10/2021    6:54 AM 10/31/2021    5:00 AM  CBC EXTENDED  WBC 4.0 - 10.5 K/uL 7.6  6.5  7.9   RBC 4.22 - 5.81 Mil/uL 4.57  3.88  3.22   Hemoglobin 13.0 - 17.0 g/dL 19.1  9.5  7.8   HCT 47.8 - 52.0 % 35.6  33.0  26.5   Platelets 150.0 - 400.0 K/uL 230.0  176  243   NEUT# 1.4 - 7.7 K/uL 5.3  4.3    Lymph# 0.7 - 4.0 K/uL 0.9  0.8       There is no height or weight on file to calculate BMI.  Orders:  No orders of the defined types were placed in this encounter.  No orders of the defined types were placed in this encounter.    Procedures: No procedures performed  Clinical Data: No additional findings.  ROS:  All other systems negative, except as noted in the HPI. Review of Systems  Objective: Vital Signs: There were no vitals taken for this visit.  Specialty Comments:  No specialty comments available.  PMFS History:  Patient Active Problem List   Diagnosis Date Noted   Acute on chronic diastolic (congestive) heart failure    Controlled type 2 diabetes mellitus with hyperglycemia, without long-term current use of insulin    Phantom limb pain    Left below-knee amputee 10/25/2021   Left leg pain    Osteomyelitis of ankle and foot 10/15/2021   Anemia 10/15/2021   Pressure ulcer 10/15/2021   History of CVA (cerebrovascular accident) 10/15/2021   Septic arthritis of ankle and foot region 10/15/2021   AKI (acute kidney injury) 10/14/2021   Pneumonia due to COVID-19 virus 09/09/2019   Hypokalemia 09/09/2019   Acute respiratory failure with hypoxia 09/09/2019   Essential hypertension 10/14/2014   Chronic diastolic heart failure (HCC) 10/14/2014   Morbid obesity (HCC) 07/22/2013   Varicose veins of lower extremities with other complications 07/22/2013   OSA (obstructive sleep apnea) 07/22/2013   Venous stasis ulcers  (HCC) 07/22/2013   Diabetes mellitus 01/03/2011   Past Medical History:  Diagnosis Date   Adenomatous colon polyp    Asbestosis    Diverticulosis    Essential hypertension    Gastritis    History of nephrolithiasis    History of open leg wound    History of stroke 03/19/2003   Lymphedema    Obstructive sleep apnea    Peptic ulcer    TIA (transient ischemic attack)    Varicose veins    Laser ablation of left great saphenous vein 2009    Family History  Problem Relation Age of Onset   Bone cancer Mother    Hypertension Father    Pancreatic cancer Maternal Grandmother    Stroke Paternal Grandmother    Diabetes Paternal Grandfather    Heart attack Brother    Hypertension Brother    Heart attack Sister     Past Surgical History:  Procedure Laterality Date   AMPUTATION Left 10/21/2021   Procedure: LEFT BELOW KNEE AMPUTATION;  Surgeon: Nadara Mustard, MD;  Location: [facility] OR;  Service: Orthopedics;  Laterality: Left;   BIOPSY  10/19/2021   Procedure: BIOPSY;  Surgeon: Imogene Burn, MD;  Location: [facility] ENDOSCOPY;  Service: Gastroenterology;;   COLONOSCOPY N/A 10/19/2021   Procedure: COLONOSCOPY;  Surgeon: Imogene Burn, MD;  Location: [facility] ENDOSCOPY;  Service: Gastroenterology;  Laterality: N/A;   CYSTOSCOPY WITH RETROGRADE PYELOGRAM, URETEROSCOPY AND STENT PLACEMENT Right 08/02/2015   Procedure: CYSTOSCOPY WITH RETROGRADE PYELOGRAM, URETEROSCOPY , STONE EXTRACTION AND STENT PLACEMENT;  Surgeon: Malen Gauze, MD;  Location: [facility] ORS;  Service: Urology;  Laterality: Right;   ESOPHAGOGASTRODUODENOSCOPY (EGD) WITH PROPOFOL N/A 10/19/2021   Procedure: ESOPHAGOGASTRODUODENOSCOPY (EGD) WITH PROPOFOL;  Surgeon: Imogene Burn, MD;  Location: [facility] ENDOSCOPY;  Service: Gastroenterology;  Laterality: N/A;   HOLMIUM LASER APPLICATION Right 08/02/2015   Procedure: HOLMIUM LASER APPLICATION;  Surgeon: Malen Gauze, MD;  Location: [facility] ORS;  Service: Urology;  Laterality: Right;   KIDNEY STONE  SURGERY     POLYPECTOMY  10/19/2021   Procedure: POLYPECTOMY;  Surgeon: Imogene Burn, MD;  Location: [facility] ENDOSCOPY;  Service: Gastroenterology;;   Gabriela Eves  1984   VEIN SURGERY Left 06/2010   Laser ablation   Social History   Occupational History   Occupation: controll room operator     Employer: [employer]  Tobacco Use   Smoking status: Never    Passive exposure: Never   Smokeless tobacco: Never  Vaping Use   Vaping Use: Never used  Substance and Sexual Activity   Alcohol  use: No    Alcohol/week: 0.0 standard drinks of alcohol   Drug use: No   Sexual activity: Not on file

## 2023-01-09 DIAGNOSIS — N390 Urinary tract infection, site not specified: Secondary | ICD-10-CM | POA: Diagnosis not present

## 2023-01-09 DIAGNOSIS — I110 Hypertensive heart disease with heart failure: Secondary | ICD-10-CM | POA: Diagnosis not present

## 2023-01-09 DIAGNOSIS — J9601 Acute respiratory failure with hypoxia: Secondary | ICD-10-CM | POA: Diagnosis not present

## 2023-01-09 DIAGNOSIS — E1121 Type 2 diabetes mellitus with diabetic nephropathy: Secondary | ICD-10-CM | POA: Diagnosis not present

## 2023-01-09 DIAGNOSIS — I872 Venous insufficiency (chronic) (peripheral): Secondary | ICD-10-CM | POA: Diagnosis not present

## 2023-01-09 DIAGNOSIS — L97212 Non-pressure chronic ulcer of right calf with fat layer exposed: Secondary | ICD-10-CM | POA: Diagnosis not present

## 2023-01-11 DIAGNOSIS — I110 Hypertensive heart disease with heart failure: Secondary | ICD-10-CM | POA: Diagnosis not present

## 2023-01-11 DIAGNOSIS — E7800 Pure hypercholesterolemia, unspecified: Secondary | ICD-10-CM | POA: Diagnosis not present

## 2023-01-11 DIAGNOSIS — Z794 Long term (current) use of insulin: Secondary | ICD-10-CM | POA: Diagnosis not present

## 2023-01-11 DIAGNOSIS — E1121 Type 2 diabetes mellitus with diabetic nephropathy: Secondary | ICD-10-CM | POA: Diagnosis not present

## 2023-01-11 DIAGNOSIS — J9601 Acute respiratory failure with hypoxia: Secondary | ICD-10-CM | POA: Diagnosis not present

## 2023-01-11 DIAGNOSIS — L97212 Non-pressure chronic ulcer of right calf with fat layer exposed: Secondary | ICD-10-CM | POA: Diagnosis not present

## 2023-01-11 DIAGNOSIS — Z7902 Long term (current) use of antithrombotics/antiplatelets: Secondary | ICD-10-CM | POA: Diagnosis not present

## 2023-01-11 DIAGNOSIS — I872 Venous insufficiency (chronic) (peripheral): Secondary | ICD-10-CM | POA: Diagnosis not present

## 2023-01-11 DIAGNOSIS — Z8673 Personal history of transient ischemic attack (TIA), and cerebral infarction without residual deficits: Secondary | ICD-10-CM | POA: Diagnosis not present

## 2023-01-11 DIAGNOSIS — D649 Anemia, unspecified: Secondary | ICD-10-CM | POA: Diagnosis not present

## 2023-01-11 DIAGNOSIS — I5033 Acute on chronic diastolic (congestive) heart failure: Secondary | ICD-10-CM | POA: Diagnosis not present

## 2023-01-11 DIAGNOSIS — Z7982 Long term (current) use of aspirin: Secondary | ICD-10-CM | POA: Diagnosis not present

## 2023-01-11 DIAGNOSIS — N200 Calculus of kidney: Secondary | ICD-10-CM | POA: Diagnosis not present

## 2023-01-11 DIAGNOSIS — Z89512 Acquired absence of left leg below knee: Secondary | ICD-10-CM | POA: Diagnosis not present

## 2023-01-11 DIAGNOSIS — N390 Urinary tract infection, site not specified: Secondary | ICD-10-CM | POA: Diagnosis not present

## 2023-01-12 ENCOUNTER — Ambulatory Visit: Payer: Medicare Other | Admitting: Nurse Practitioner

## 2023-01-12 DIAGNOSIS — L97212 Non-pressure chronic ulcer of right calf with fat layer exposed: Secondary | ICD-10-CM | POA: Diagnosis not present

## 2023-01-12 DIAGNOSIS — J9601 Acute respiratory failure with hypoxia: Secondary | ICD-10-CM | POA: Diagnosis not present

## 2023-01-12 DIAGNOSIS — I110 Hypertensive heart disease with heart failure: Secondary | ICD-10-CM | POA: Diagnosis not present

## 2023-01-12 DIAGNOSIS — E1121 Type 2 diabetes mellitus with diabetic nephropathy: Secondary | ICD-10-CM | POA: Diagnosis not present

## 2023-01-12 DIAGNOSIS — I872 Venous insufficiency (chronic) (peripheral): Secondary | ICD-10-CM | POA: Diagnosis not present

## 2023-01-12 DIAGNOSIS — N390 Urinary tract infection, site not specified: Secondary | ICD-10-CM | POA: Diagnosis not present

## 2023-01-16 DIAGNOSIS — J9601 Acute respiratory failure with hypoxia: Secondary | ICD-10-CM | POA: Diagnosis not present

## 2023-01-16 DIAGNOSIS — E1121 Type 2 diabetes mellitus with diabetic nephropathy: Secondary | ICD-10-CM | POA: Diagnosis not present

## 2023-01-16 DIAGNOSIS — N390 Urinary tract infection, site not specified: Secondary | ICD-10-CM | POA: Diagnosis not present

## 2023-01-16 DIAGNOSIS — I872 Venous insufficiency (chronic) (peripheral): Secondary | ICD-10-CM | POA: Diagnosis not present

## 2023-01-16 DIAGNOSIS — L97212 Non-pressure chronic ulcer of right calf with fat layer exposed: Secondary | ICD-10-CM | POA: Diagnosis not present

## 2023-01-16 DIAGNOSIS — I110 Hypertensive heart disease with heart failure: Secondary | ICD-10-CM | POA: Diagnosis not present

## 2023-01-19 DIAGNOSIS — I872 Venous insufficiency (chronic) (peripheral): Secondary | ICD-10-CM | POA: Diagnosis not present

## 2023-01-19 DIAGNOSIS — J9601 Acute respiratory failure with hypoxia: Secondary | ICD-10-CM | POA: Diagnosis not present

## 2023-01-19 DIAGNOSIS — E1121 Type 2 diabetes mellitus with diabetic nephropathy: Secondary | ICD-10-CM | POA: Diagnosis not present

## 2023-01-19 DIAGNOSIS — N390 Urinary tract infection, site not specified: Secondary | ICD-10-CM | POA: Diagnosis not present

## 2023-01-19 DIAGNOSIS — L97212 Non-pressure chronic ulcer of right calf with fat layer exposed: Secondary | ICD-10-CM | POA: Diagnosis not present

## 2023-01-19 DIAGNOSIS — I110 Hypertensive heart disease with heart failure: Secondary | ICD-10-CM | POA: Diagnosis not present

## 2023-01-23 DIAGNOSIS — I872 Venous insufficiency (chronic) (peripheral): Secondary | ICD-10-CM | POA: Diagnosis not present

## 2023-01-23 DIAGNOSIS — J9601 Acute respiratory failure with hypoxia: Secondary | ICD-10-CM | POA: Diagnosis not present

## 2023-01-23 DIAGNOSIS — E1121 Type 2 diabetes mellitus with diabetic nephropathy: Secondary | ICD-10-CM | POA: Diagnosis not present

## 2023-01-23 DIAGNOSIS — L97212 Non-pressure chronic ulcer of right calf with fat layer exposed: Secondary | ICD-10-CM | POA: Diagnosis not present

## 2023-01-23 DIAGNOSIS — I110 Hypertensive heart disease with heart failure: Secondary | ICD-10-CM | POA: Diagnosis not present

## 2023-01-23 DIAGNOSIS — N390 Urinary tract infection, site not specified: Secondary | ICD-10-CM | POA: Diagnosis not present

## 2023-01-25 ENCOUNTER — Ambulatory Visit: Payer: Medicare Other | Admitting: Orthopedic Surgery

## 2023-01-25 ENCOUNTER — Encounter: Payer: Self-pay | Admitting: Orthopedic Surgery

## 2023-01-25 DIAGNOSIS — L97919 Non-pressure chronic ulcer of unspecified part of right lower leg with unspecified severity: Secondary | ICD-10-CM | POA: Diagnosis not present

## 2023-01-25 DIAGNOSIS — Z89512 Acquired absence of left leg below knee: Secondary | ICD-10-CM

## 2023-01-25 DIAGNOSIS — I83019 Varicose veins of right lower extremity with ulcer of unspecified site: Secondary | ICD-10-CM | POA: Diagnosis not present

## 2023-01-25 NOTE — Progress Notes (Signed)
Office Visit Note   Patient: [name] [name]           Date of Birth: 14-Oct-1955           MRN: [id number] Visit Date: 01/25/2023              Requested by: Sasser, Clarene Critchley, MD [address] [place],  [place] [address] PCP: Estanislado Pandy, MD  Chief Complaint  Patient presents with   Left Leg - Follow-up    10/21/2021 left BKA          HPI: Patient is a 67 year old gentleman who is seen in follow-up for both lower extremities he is status post a left transtibial amputation and currently undergoing compression wraps at home 2 times a week with home health nursing for the venous and lymphatic insufficiency.  Assessment & Plan: Visit Diagnoses:  1. Left below-knee amputee (HCC)   2. Venous ulcer of right leg (HCC)     Plan: Continue with compression wraps 2 times a week at home with home health nursing.  Follow-Up Instructions: Return in about 4 weeks (around 02/22/2023).   Ortho Exam  Patient is alert, oriented, no adenopathy, well-dressed, normal affect, normal respiratory effort. Examination the left below-knee amputation is well consolidated no swelling no cellulitis.  Examination of his right leg the swelling continues to improve in the foot and leg.  There is no cellulitis no drainage no signs of infection.  Imaging: No results found. No images are attached to the encounter.  Labs: Lab Results  Component Value Date   HGBA1C 7.3 (H) 10/17/2021   HGBA1C 6.5 (H) 09/12/2019   CRP 17.6 (H) 09/14/2019   CRP 14.7 (H) 09/13/2019   CRP 14.6 (H) 09/12/2019   REPTSTATUS 11/20/2021 FINAL 11/17/2021   CULT (A) 11/17/2021    >=100,000 COLONIES/mL ESCHERICHIA COLI >=100,000 COLONIES/mL KLEBSIELLA OXYTOCA    LABORGA ESCHERICHIA COLI (A) 11/17/2021   LABORGA KLEBSIELLA OXYTOCA (A) 11/17/2021     Lab Results  Component Value Date   ALBUMIN 2.6 (L) 11/10/2021   ALBUMIN 1.8 (L) 10/25/2021   ALBUMIN 2.5 (L) 10/14/2021    Lab Results  Component Value Date   MG 2.1  09/14/2019   MG 2.3 09/13/2019   MG 1.9 09/12/2019   No results found for: "VD25OH"  No results found for: "PREALBUMIN"    Latest Ref Rng & Units 12/21/2021    9:53 AM 11/10/2021    6:54 AM 10/31/2021    5:00 AM  CBC EXTENDED  WBC 4.0 - 10.5 K/uL 7.6  6.5  7.9   RBC 4.22 - 5.81 Mil/uL 4.57  3.88  3.22   Hemoglobin 13.0 - 17.0 g/dL 62.9  9.5  7.8   HCT 52.8 - 52.0 % 35.6  33.0  26.5   Platelets 150.0 - 400.0 K/uL 230.0  176  243   NEUT# 1.4 - 7.7 K/uL 5.3  4.3    Lymph# 0.7 - 4.0 K/uL 0.9  0.8       There is no height or weight on file to calculate BMI.  Orders:  No orders of the defined types were placed in this encounter.  No orders of the defined types were placed in this encounter.    Procedures: No procedures performed  Clinical Data: No additional findings.  ROS:  All other systems negative, except as noted in the HPI. Review of Systems  Objective: Vital Signs: There were no vitals taken for this visit.  Specialty  Comments:  No specialty comments available.  PMFS History: Patient Active Problem List   Diagnosis Date Noted   Acute on chronic diastolic (congestive) heart failure (HCC)    Controlled type 2 diabetes mellitus with hyperglycemia, without long-term current use of insulin (HCC)    Phantom limb pain (HCC)    Left below-knee amputee (HCC) 10/25/2021   Left leg pain    Osteomyelitis of ankle and foot (HCC) 10/15/2021   Anemia 10/15/2021   Pressure ulcer 10/15/2021   History of CVA (cerebrovascular accident) 10/15/2021   Septic arthritis of ankle and foot region (HCC) 10/15/2021   AKI (acute kidney injury) (HCC) 10/14/2021   Pneumonia due to COVID-19 virus 09/09/2019   Hypokalemia 09/09/2019   Acute respiratory failure with hypoxia (HCC) 09/09/2019   Essential hypertension 10/14/2014   Chronic diastolic heart failure (HCC) 10/14/2014   Morbid obesity (HCC) 07/22/2013   Varicose veins of lower extremities with other complications 07/22/2013    OSA (obstructive sleep apnea) 07/22/2013   Venous stasis ulcers (HCC) 07/22/2013   Diabetes mellitus (HCC) 01/03/2011   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HCC)    Diverticulosis    Essential hypertension    Gastritis    History of nephrolithiasis    History of open leg wound    History of stroke 03/19/2003   Lymphedema    Obstructive sleep apnea    Peptic ulcer    TIA (transient ischemic attack)    Varicose veins    Laser ablation of left great saphenous vein 2009    Family History  Problem Relation Age of Onset   Bone cancer Mother    Hypertension Father    Pancreatic cancer Maternal Grandmother    Stroke Paternal Grandmother    Diabetes Paternal Grandfather    Heart attack Brother    Hypertension Brother    Heart attack Sister     Past Surgical History:  Procedure Laterality Date   AMPUTATION Left 10/21/2021   Procedure: LEFT BELOW KNEE AMPUTATION;  Surgeon: Nadara Mustard, MD;  Location: [facility] OR;  Service: Orthopedics;  Laterality: Left;   BIOPSY  10/19/2021   Procedure: BIOPSY;  Surgeon: Imogene Burn, MD;  Location: [facility] ENDOSCOPY;  Service: Gastroenterology;;   COLONOSCOPY N/A 10/19/2021   Procedure: COLONOSCOPY;  Surgeon: Imogene Burn, MD;  Location: [facility] ENDOSCOPY;  Service: Gastroenterology;  Laterality: N/A;   CYSTOSCOPY WITH RETROGRADE PYELOGRAM, URETEROSCOPY AND STENT PLACEMENT Right 08/02/2015   Procedure: CYSTOSCOPY WITH RETROGRADE PYELOGRAM, URETEROSCOPY , STONE EXTRACTION AND STENT PLACEMENT;  Surgeon: Malen Gauze, MD;  Location: [facility] ORS;  Service: Urology;  Laterality: Right;   ESOPHAGOGASTRODUODENOSCOPY (EGD) WITH PROPOFOL N/A 10/19/2021   Procedure: ESOPHAGOGASTRODUODENOSCOPY (EGD) WITH PROPOFOL;  Surgeon: Imogene Burn, MD;  [facility] ENDOSCOPY;  Service: Gastroenterology;  Laterality: N/A;   HOLMIUM LASER APPLICATION Right 08/02/2015   Procedure: HOLMIUM LASER APPLICATION;  Surgeon: Malen Gauze, MD;   Location: [facility] ORS;  Service: Urology;  Laterality: Right;   KIDNEY STONE SURGERY     POLYPECTOMY  10/19/2021   Procedure: POLYPECTOMY;  Surgeon: Imogene Burn, MD;  Location: [facility] ENDOSCOPY;  Service: Gastroenterology;;   VASECTOMY  1984   VEIN SURGERY Left 06/2010   Laser ablation   Social History   Occupational History   Occupation: controll room operator     Employer: [employer]  Tobacco Use   Smoking status: Never    Passive exposure: Never   Smokeless  tobacco: Never  Vaping Use   Vaping Use: Never used  Substance and Sexual Activity   Alcohol use: No    Alcohol/week: 0.0 standard drinks of alcohol   Drug use: No   Sexual activity: Not on file

## 2023-01-30 DIAGNOSIS — I872 Venous insufficiency (chronic) (peripheral): Secondary | ICD-10-CM | POA: Diagnosis not present

## 2023-01-30 DIAGNOSIS — E1121 Type 2 diabetes mellitus with diabetic nephropathy: Secondary | ICD-10-CM | POA: Diagnosis not present

## 2023-01-30 DIAGNOSIS — J9601 Acute respiratory failure with hypoxia: Secondary | ICD-10-CM | POA: Diagnosis not present

## 2023-01-30 DIAGNOSIS — L97212 Non-pressure chronic ulcer of right calf with fat layer exposed: Secondary | ICD-10-CM | POA: Diagnosis not present

## 2023-01-30 DIAGNOSIS — N390 Urinary tract infection, site not specified: Secondary | ICD-10-CM | POA: Diagnosis not present

## 2023-01-30 DIAGNOSIS — I110 Hypertensive heart disease with heart failure: Secondary | ICD-10-CM | POA: Diagnosis not present

## 2023-01-31 DIAGNOSIS — J9601 Acute respiratory failure with hypoxia: Secondary | ICD-10-CM | POA: Diagnosis not present

## 2023-01-31 DIAGNOSIS — I110 Hypertensive heart disease with heart failure: Secondary | ICD-10-CM | POA: Diagnosis not present

## 2023-01-31 DIAGNOSIS — I872 Venous insufficiency (chronic) (peripheral): Secondary | ICD-10-CM | POA: Diagnosis not present

## 2023-01-31 DIAGNOSIS — L97212 Non-pressure chronic ulcer of right calf with fat layer exposed: Secondary | ICD-10-CM | POA: Diagnosis not present

## 2023-01-31 DIAGNOSIS — I5033 Acute on chronic diastolic (congestive) heart failure: Secondary | ICD-10-CM | POA: Diagnosis not present

## 2023-01-31 DIAGNOSIS — D649 Anemia, unspecified: Secondary | ICD-10-CM | POA: Diagnosis not present

## 2023-01-31 DIAGNOSIS — N390 Urinary tract infection, site not specified: Secondary | ICD-10-CM | POA: Diagnosis not present

## 2023-01-31 DIAGNOSIS — E1121 Type 2 diabetes mellitus with diabetic nephropathy: Secondary | ICD-10-CM | POA: Diagnosis not present

## 2023-02-02 DIAGNOSIS — E1121 Type 2 diabetes mellitus with diabetic nephropathy: Secondary | ICD-10-CM | POA: Diagnosis not present

## 2023-02-02 DIAGNOSIS — I110 Hypertensive heart disease with heart failure: Secondary | ICD-10-CM | POA: Diagnosis not present

## 2023-02-02 DIAGNOSIS — I872 Venous insufficiency (chronic) (peripheral): Secondary | ICD-10-CM | POA: Diagnosis not present

## 2023-02-02 DIAGNOSIS — L97212 Non-pressure chronic ulcer of right calf with fat layer exposed: Secondary | ICD-10-CM | POA: Diagnosis not present

## 2023-02-02 DIAGNOSIS — N390 Urinary tract infection, site not specified: Secondary | ICD-10-CM | POA: Diagnosis not present

## 2023-02-02 DIAGNOSIS — J9601 Acute respiratory failure with hypoxia: Secondary | ICD-10-CM | POA: Diagnosis not present

## 2023-02-06 DIAGNOSIS — L97212 Non-pressure chronic ulcer of right calf with fat layer exposed: Secondary | ICD-10-CM | POA: Diagnosis not present

## 2023-02-06 DIAGNOSIS — E1121 Type 2 diabetes mellitus with diabetic nephropathy: Secondary | ICD-10-CM | POA: Diagnosis not present

## 2023-02-06 DIAGNOSIS — J9601 Acute respiratory failure with hypoxia: Secondary | ICD-10-CM | POA: Diagnosis not present

## 2023-02-06 DIAGNOSIS — N390 Urinary tract infection, site not specified: Secondary | ICD-10-CM | POA: Diagnosis not present

## 2023-02-06 DIAGNOSIS — I872 Venous insufficiency (chronic) (peripheral): Secondary | ICD-10-CM | POA: Diagnosis not present

## 2023-02-06 DIAGNOSIS — I110 Hypertensive heart disease with heart failure: Secondary | ICD-10-CM | POA: Diagnosis not present

## 2023-02-09 ENCOUNTER — Ambulatory Visit: Payer: Medicare Other | Admitting: Nurse Practitioner

## 2023-02-09 DIAGNOSIS — J9601 Acute respiratory failure with hypoxia: Secondary | ICD-10-CM | POA: Diagnosis not present

## 2023-02-09 DIAGNOSIS — I872 Venous insufficiency (chronic) (peripheral): Secondary | ICD-10-CM | POA: Diagnosis not present

## 2023-02-09 DIAGNOSIS — L97212 Non-pressure chronic ulcer of right calf with fat layer exposed: Secondary | ICD-10-CM | POA: Diagnosis not present

## 2023-02-09 DIAGNOSIS — I110 Hypertensive heart disease with heart failure: Secondary | ICD-10-CM | POA: Diagnosis not present

## 2023-02-09 DIAGNOSIS — E1121 Type 2 diabetes mellitus with diabetic nephropathy: Secondary | ICD-10-CM | POA: Diagnosis not present

## 2023-02-09 DIAGNOSIS — N390 Urinary tract infection, site not specified: Secondary | ICD-10-CM | POA: Diagnosis not present

## 2023-02-09 NOTE — Progress Notes (Deleted)
Cardiology Office Note:    Date:  10/09/2022  ID:  [name] [name], [name] [name] Jun 23, 1956, MRN [id number]  PCP:  Estanislado Pandy, MD   [facility] Providers Cardiologist:  Nona Dell, MD     Referring MD: Estanislado Pandy, MD   CC: Here for follow-up  History of Present Illness:    [name] [name] is a 67 y.o. male with a hx of the following:  HFmrEF HTN Hx of TIA in 2000 OSA, no CPAP.  Hx of stroke in 1983  Patient is a very pleasant 67 year old male with past medical history as mentioned above.  Echocardiogram in August 2023 revealed mildly reduced EF of 45 to 50%, mild diastolic dysfunction (grade 1 DD).  Last seen by Dr. Diona Browner on June 07, 2022.  Was not able to stand away at the time, was working on being stable on his left leg prosthesis.  Was started on Ozempic by PCP.  Was continued on Aldactone, Toprol-XL, and hydralazine no medication changes were made.  Was told to follow-up in January 2024.   He presents for follow-up with his sister.  He states he is doing well. Chronic hx of leg edema, skin breakdown, weeping skin, and chronic wounds for over 20 years. Sees Dr. Lajoyce Corners who is managing this. States HH RN comes to his house to perform wound care and wrap his right lower leg. 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 Overall doing well from a cardiac perspective.  He can tell his weight is down.  Denies any chest pain, shortness of breath, palpitations, syncope, presyncope, dizziness, orthopnea, PND, worsening swelling or significant weight changes, acute bleeding, or claudication.  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HCC)    Diverticulosis    Essential hypertension    Gastritis    History of nephrolithiasis    History of open leg wound    History of stroke 03/19/2003   Lymphedema    Obstructive sleep apnea    Peptic ulcer    TIA (transient ischemic attack)    Varicose veins    Laser ablation of left great saphenous vein 2009     Past Surgical History:  Procedure Laterality Date   AMPUTATION Left 10/21/2021   Procedure: LEFT BELOW KNEE AMPUTATION;  Surgeon: Nadara Mustard, MD;  Location: [facility] OR;  Service: Orthopedics;  Laterality: Left;   BIOPSY  10/19/2021   Procedure: BIOPSY;  Surgeon: Imogene Burn, MD;  Location: [facility] ENDOSCOPY;  Service: Gastroenterology;;   COLONOSCOPY N/A 10/19/2021   Procedure: COLONOSCOPY;  Surgeon: Imogene Burn, MD;  Location: [facility] ENDOSCOPY;  Service: Gastroenterology;  Laterality: N/A;   CYSTOSCOPY WITH RETROGRADE PYELOGRAM, URETEROSCOPY AND STENT PLACEMENT Right 08/02/2015   Procedure: CYSTOSCOPY WITH RETROGRADE PYELOGRAM, URETEROSCOPY , STONE EXTRACTION AND STENT PLACEMENT;  Surgeon: Malen Gauze, MD;  Location: [facility] ORS;  Service: Urology;  Laterality: Right;   ESOPHAGOGASTRODUODENOSCOPY (EGD) WITH PROPOFOL N/A 10/19/2021   Procedure: ESOPHAGOGASTRODUODENOSCOPY (EGD) WITH PROPOFOL;  Surgeon: Imogene Burn, MD;  Location: [facility] ENDOSCOPY;  Service: Gastroenterology;  Laterality: N/A;   HOLMIUM LASER APPLICATION Right 08/02/2015   Procedure: HOLMIUM LASER APPLICATION;  Surgeon: Malen Gauze, MD;  Location: [facility] ORS;  Service: Urology;  Laterality: Right;   KIDNEY STONE SURGERY     POLYPECTOMY  10/19/2021   Procedure: POLYPECTOMY;  Surgeon: Imogene Burn, MD;  Location: [facility] ENDOSCOPY;  Service: Gastroenterology;;   VASECTOMY  1984   VEIN SURGERY Left 06/2010  Laser ablation    Current Medications: Current Meds  Medication Sig   acetaminophen (TYLENOL) 325 MG tablet Take 1-2 tablets (325-650 mg total) by mouth every 6 (six) hours as needed for mild pain (pain score 1-3 or temp > 100.5).   allopurinol (ZYLOPRIM) 300 MG tablet Take 300 mg by mouth daily.   Ascorbic Acid (VITAMIN C) 1000 MG tablet Take 1,000 mg by mouth as needed.   aspirin EC 81 MG tablet Take 1 tablet (81 mg total) by mouth daily.   atorvastatin (LIPITOR) 10 MG tablet Take 10 mg by mouth daily.   clopidogrel (PLAVIX)  75 MG tablet Take 75 mg by mouth daily.   ferrous sulfate 325 (65 FE) MG tablet Take 1 tablet (325 mg total) by mouth daily with breakfast.   furosemide (LASIX) 40 MG tablet Take 40 mg by mouth daily.   gabapentin (NEURONTIN) 100 MG capsule Take 1 capsule (100 mg total) by mouth at bedtime.   hydrALAZINE (APRESOLINE) 50 MG tablet Take 50 mg by mouth 3 (three) times daily.   metoprolol succinate (TOPROL-XL) 25 MG 24 hr tablet Take 1 tablet (25 mg total) by mouth daily. (Patient taking differently: Take 50 mg by mouth daily.)   Semaglutide (OZEMPIC, 0.25 OR 0.5 MG/DOSE, [place]) Inject into the skin once a week.   spironolactone (ALDACTONE) 25 MG tablet Take 0.5 tablets (12.5 mg total) by mouth daily.     Allergies:   Patient has no known allergies.   Social History   Socioeconomic History   Marital status: Married    Spouse name: Not on file   Number of children: 4   Years of education: 14   Highest education level: Not on file  Occupational History   Occupation: controll Freight forwarder     Employer: [employer]  Tobacco Use   Smoking status: Never    Passive exposure: Never   Smokeless tobacco: Never  Vaping Use   Vaping Use: Never used  Substance and Sexual Activity   Alcohol use: No    Alcohol/week: 0.0 standard drinks of alcohol   Drug use: No   Sexual activity: Not on file  Other Topics Concern   Not on file  Social History Narrative   Not on file   Social Determinants of Health   Financial Resource Strain: Not on file  Food Insecurity: Not on file  Transportation Needs: Not on file  Physical Activity: Not on file  Stress: Not on file  Social Connections: Not on file     Family History: The patient's family history includes Bone cancer in his mother; Diabetes in his paternal grandfather; Heart attack in his brother and sister; Hypertension in his brother and father; Pancreatic cancer in his maternal grandmother; Stroke in his paternal grandmother.  ROS:   Review of  Systems  Constitutional: Negative.   HENT: Negative.    Eyes: Negative.   Respiratory: Negative.    Cardiovascular:  Positive for leg swelling. Negative for chest pain, palpitations, orthopnea, claudication and PND.       See HPI.   Gastrointestinal: Negative.   Genitourinary: Negative.   Musculoskeletal: Negative.   Skin: Negative.        See HPI.   Neurological: Negative.   Endo/Heme/Allergies: Negative.   Psychiatric/Behavioral: Negative.      Please see the history of present illness.    All other systems reviewed and are negative.  EKGs/Labs/Other Studies Reviewed:    The following studies were reviewed today:   EKG:  EKG is not ordered today.     Echocardiogram on 05/02/2022: 1. Left ventricular ejection fraction, by estimation, is 45 to 50%. The  left ventricle has mildly decreased function. The left ventricle  demonstrates global hypokinesis. The left ventricular internal cavity size  was mildly dilated. There is severe left  ventricular hypertrophy. Left ventricular diastolic parameters are  consistent with Grade I diastolic dysfunction (impaired relaxation). The  average left ventricular global longitudinal strain is -13.9 %. The global  longitudinal strain is abnormal.   2. Right ventricular systolic function is low normal. The right  ventricular size is normal. Tricuspid regurgitation signal is inadequate  for assessing PA pressure.   3. The mitral valve is grossly normal. Trivial mitral valve  regurgitation.   4. The aortic valve is tricuspid. Aortic valve regurgitation is mild.  Aortic valve sclerosis is present, with no evidence of aortic valve  stenosis.   5. Aortic dilatation noted. There is moderate dilatation of the ascending  aorta, measuring 43 mm.   6. The inferior vena cava is normal in size with greater than 50%  respiratory variability, suggesting right atrial pressure of 3 mmHg.   Comparison(s): Prior images unable to be directly viewed.     Recent Labs: No results found for requested labs within last 365 days.  Recent Lipid Panel    Component Value Date/Time   TRIG 35 09/09/2019 0234   Physical Exam:    VS:  There were no vitals taken for this visit.    Wt Readings from Last 3 Encounters:  04/24/22 (!) 310 lb (140.6 kg)  11/17/21 (!) 326 lb 4.5 oz (148 kg)  10/15/21 (!) 351 lb (159.2 kg)     GEN: Morbidly obese, 67 y.o. male in no acute distress HEENT: Normal NECK: No JVD; No carotid bruits CARDIAC: S1/S2, RRR, no murmurs, rubs, gallops; 2+ pulses RESPIRATORY:  Clear to auscultation without rales, wheezing or rhonchi  MUSCULOSKELETAL:  Generalized edema, edema to RLE; left BKA SKIN: Warm and dry NEUROLOGIC:  Alert and oriented x 3 PSYCHIATRIC:  Normal affect   ASSESSMENT:    No diagnosis found.  PLAN:    In order of problems listed above:  Chronic HFmrEF, leg edema TTE in 04/2022 revealed mildly reduced EF at 45 to 50%, mild diastolic dysfunction.  Dr. Diona Browner suggested adding spironolactone at that time.  Will initiate low-dose Entresto and repeat BMET in 2 weeks per protocol.  Continue Lasix, hydralazine, metoprolol, and Aldactone.  Low-salt, heart healthy diet recommended.  Discussed to contact our office if he appears more swollen, as patient is unable to weigh himself on scale as he is wheelchair-bound.  Discussed fluid restrictions of less than 2000 mL daily.  He verbalized understanding.  Leg edema stable.  Continue current medication regimen.  Continue follow-up with home health.  HTN Blood pressure on arrival, 146/84, repeat BP 136/82.  Will initiate Entresto as mentioned above.  Continue current medication regimen. Discussed to monitor BP at home at least 2 hours after medications and sitting for 5-10 minutes.  Low-salt, heart healthy diet recommended.  3. Aortic dilatation Moderate dilatation of ascending aorta measured on echocardiogram in August 2023, measured 43 mm.  Recommend updating  echocardiogram in 1 year.  4. Hx of stroke/TIA Remote history.  Denies any recent symptoms.  Continue Lipitor and Plavix.  5. OSA, refuses CPAP Encouraged compliance, however patient declines.  Risks discussed regarding noncompliance with CPAP.  He verbalized understanding.  Continue to follow with PCP.  6. Disposition:  Follow-up with me in 2 months or sooner if anything changes.   Medication Adjustments/Labs and Tests Ordered: Current medicines are reviewed at length with the patient today.  Concerns regarding medicines are outlined above.  No orders of the defined types were placed in this encounter.  No orders of the defined types were placed in this encounter.   There are no Patient Instructions on file for this visit.    Signed, Sharlene Dory, NP  02/09/2023 8:24 AM    [facility]

## 2023-02-10 DIAGNOSIS — Z8673 Personal history of transient ischemic attack (TIA), and cerebral infarction without residual deficits: Secondary | ICD-10-CM | POA: Diagnosis not present

## 2023-02-10 DIAGNOSIS — Z89512 Acquired absence of left leg below knee: Secondary | ICD-10-CM | POA: Diagnosis not present

## 2023-02-10 DIAGNOSIS — N390 Urinary tract infection, site not specified: Secondary | ICD-10-CM | POA: Diagnosis not present

## 2023-02-10 DIAGNOSIS — J9601 Acute respiratory failure with hypoxia: Secondary | ICD-10-CM | POA: Diagnosis not present

## 2023-02-10 DIAGNOSIS — L97212 Non-pressure chronic ulcer of right calf with fat layer exposed: Secondary | ICD-10-CM | POA: Diagnosis not present

## 2023-02-10 DIAGNOSIS — Z7982 Long term (current) use of aspirin: Secondary | ICD-10-CM | POA: Diagnosis not present

## 2023-02-10 DIAGNOSIS — E1121 Type 2 diabetes mellitus with diabetic nephropathy: Secondary | ICD-10-CM | POA: Diagnosis not present

## 2023-02-10 DIAGNOSIS — N200 Calculus of kidney: Secondary | ICD-10-CM | POA: Diagnosis not present

## 2023-02-10 DIAGNOSIS — I872 Venous insufficiency (chronic) (peripheral): Secondary | ICD-10-CM | POA: Diagnosis not present

## 2023-02-10 DIAGNOSIS — Z794 Long term (current) use of insulin: Secondary | ICD-10-CM | POA: Diagnosis not present

## 2023-02-10 DIAGNOSIS — I110 Hypertensive heart disease with heart failure: Secondary | ICD-10-CM | POA: Diagnosis not present

## 2023-02-10 DIAGNOSIS — E7800 Pure hypercholesterolemia, unspecified: Secondary | ICD-10-CM | POA: Diagnosis not present

## 2023-02-10 DIAGNOSIS — D649 Anemia, unspecified: Secondary | ICD-10-CM | POA: Diagnosis not present

## 2023-02-10 DIAGNOSIS — Z7902 Long term (current) use of antithrombotics/antiplatelets: Secondary | ICD-10-CM | POA: Diagnosis not present

## 2023-02-10 DIAGNOSIS — I5033 Acute on chronic diastolic (congestive) heart failure: Secondary | ICD-10-CM | POA: Diagnosis not present

## 2023-02-13 DIAGNOSIS — I110 Hypertensive heart disease with heart failure: Secondary | ICD-10-CM | POA: Diagnosis not present

## 2023-02-13 DIAGNOSIS — L97212 Non-pressure chronic ulcer of right calf with fat layer exposed: Secondary | ICD-10-CM | POA: Diagnosis not present

## 2023-02-13 DIAGNOSIS — N390 Urinary tract infection, site not specified: Secondary | ICD-10-CM | POA: Diagnosis not present

## 2023-02-13 DIAGNOSIS — J9601 Acute respiratory failure with hypoxia: Secondary | ICD-10-CM | POA: Diagnosis not present

## 2023-02-13 DIAGNOSIS — I872 Venous insufficiency (chronic) (peripheral): Secondary | ICD-10-CM | POA: Diagnosis not present

## 2023-02-13 DIAGNOSIS — E1121 Type 2 diabetes mellitus with diabetic nephropathy: Secondary | ICD-10-CM | POA: Diagnosis not present

## 2023-02-13 IMAGING — DX DG ANKLE COMPLETE 3+V*L*
3 series · 3 of 3 positions shown · non-contrast
Comparison: None.

CLINICAL DATA: Ankle pain for 1 month

EXAM:
LEFT ANKLE COMPLETE - 3+ VIEW

[ankle ap]
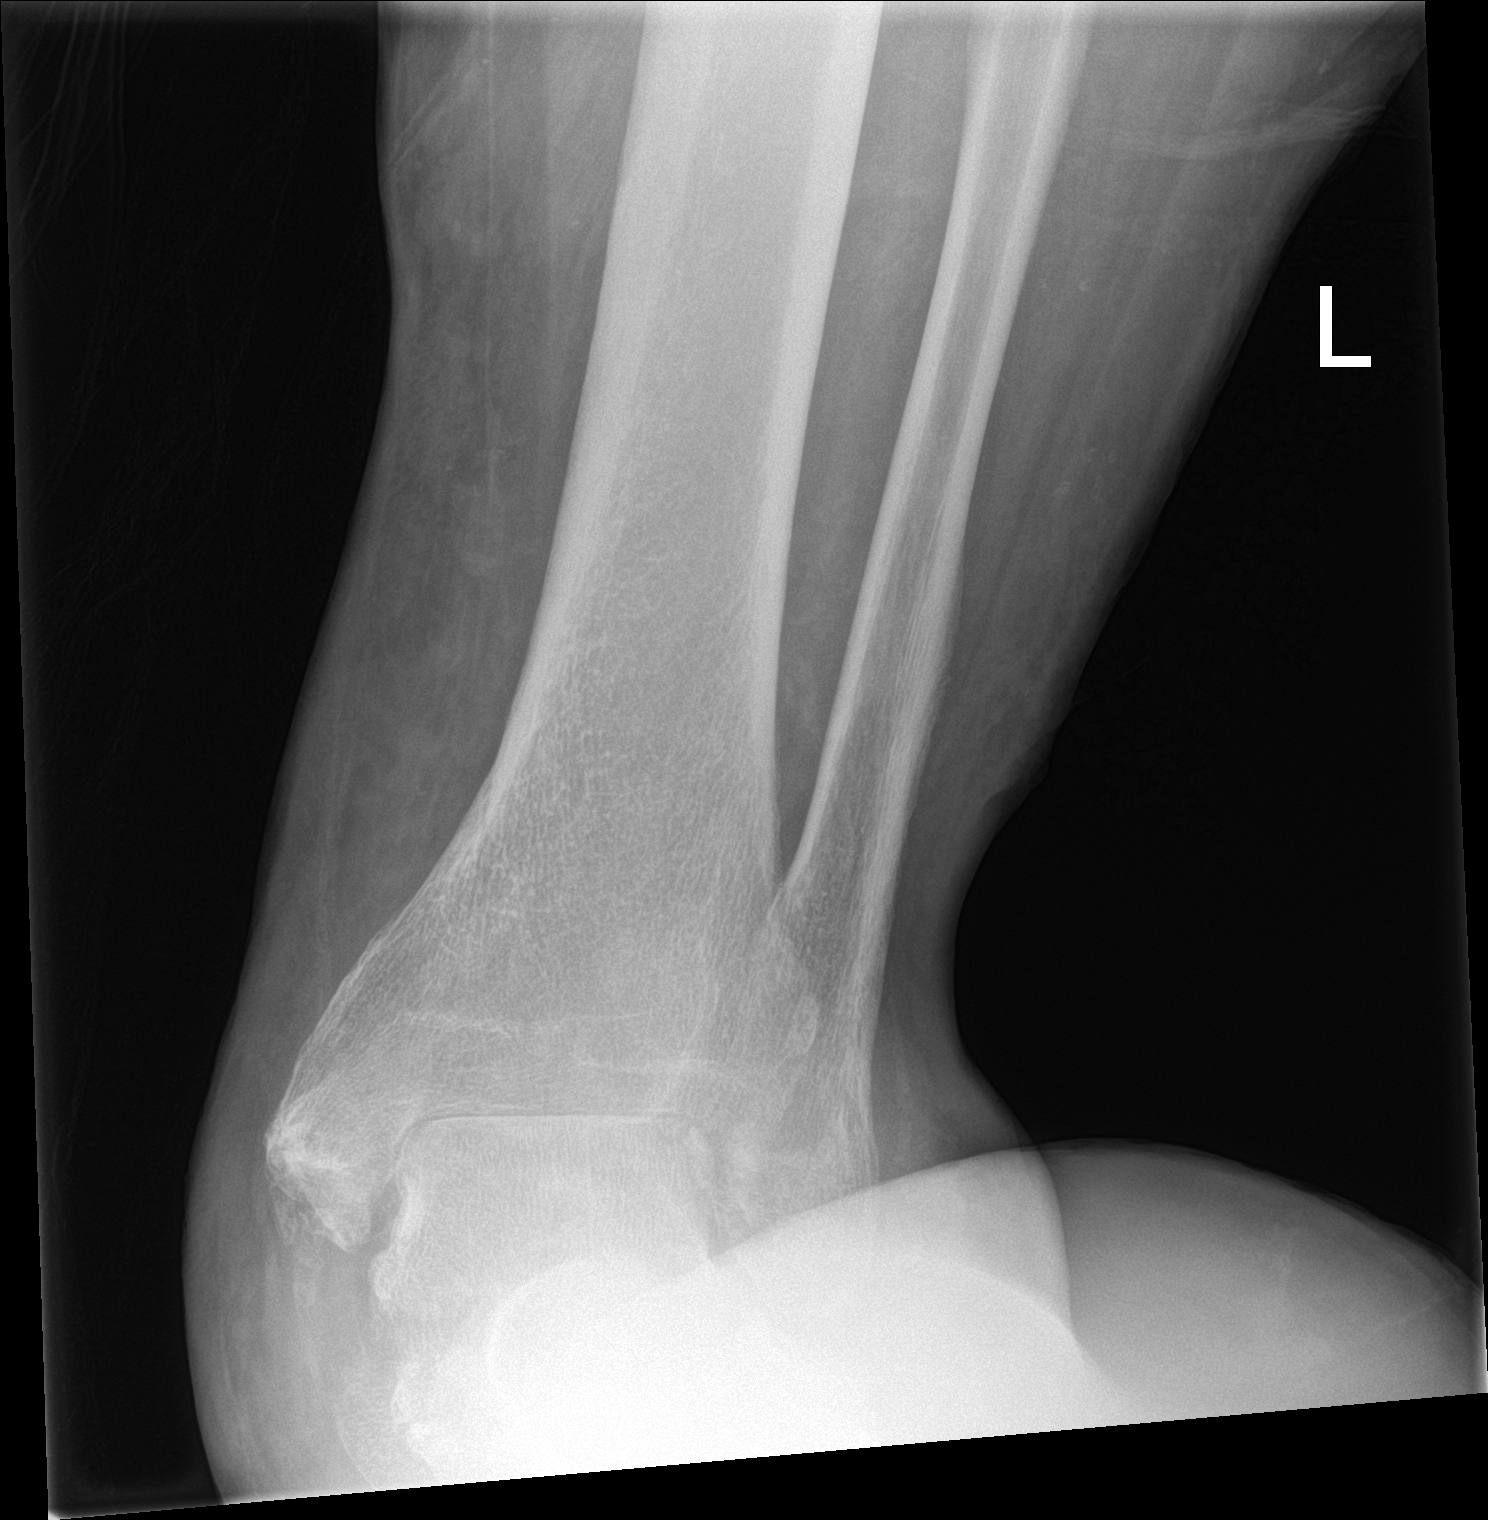

[ankle obl]
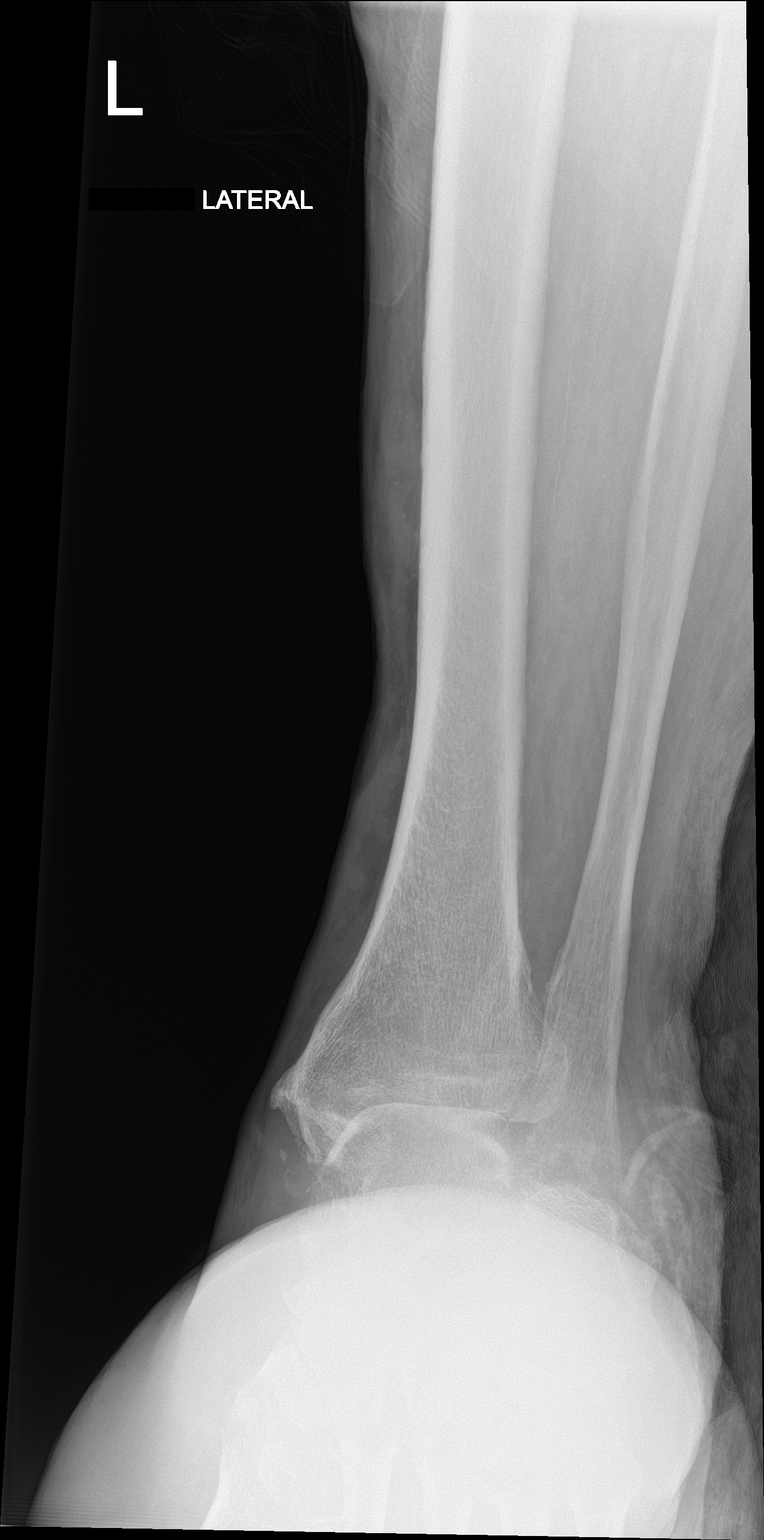

[ankle lat]
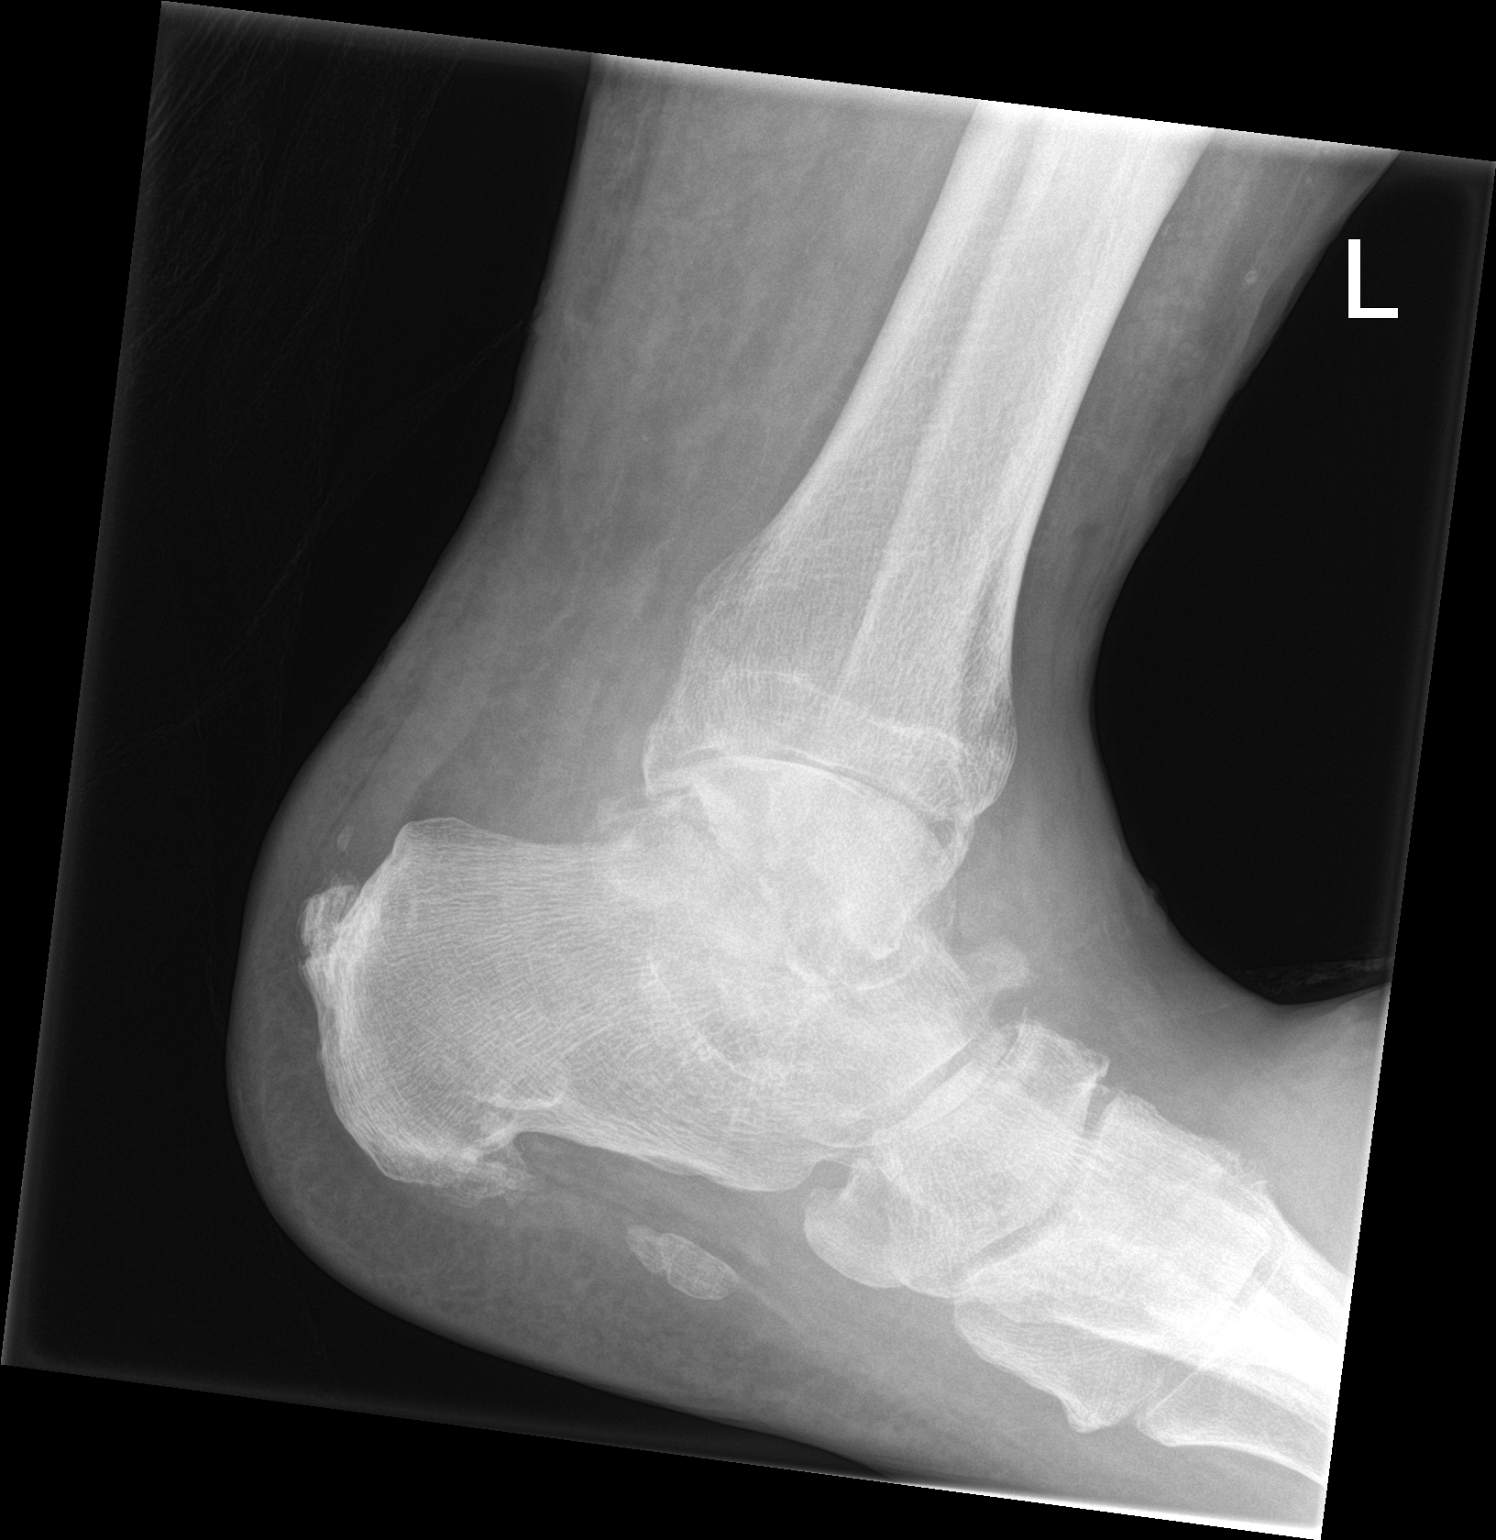

[3 of 3 positions shown; findings below may reference images not displayed]

FINDINGS: Frontal, oblique, and lateral views of the left ankle are obtained.
Evaluation is limited due to patient positioning and cooperation. No
acute fracture, subluxation, or dislocation. There is severe
osteoarthritis of the tibiotalar joint and subtalar joints. Mild
osteoarthritis of the midfoot. Prominent superior and inferior
calcaneal spurs, with calcification in the plantar fascia. Diffuse
soft tissue edema.
IMPRESSION: 1. Severe osteoarthritis of the left ankle and hindfoot.
2. Diffuse soft tissue swelling.
3. No acute fracture.

## 2023-02-14 DIAGNOSIS — Z87442 Personal history of urinary calculi: Secondary | ICD-10-CM | POA: Diagnosis not present

## 2023-02-14 DIAGNOSIS — J9602 Acute respiratory failure with hypercapnia: Secondary | ICD-10-CM | POA: Diagnosis present

## 2023-02-14 DIAGNOSIS — G64 Other disorders of peripheral nervous system: Secondary | ICD-10-CM | POA: Diagnosis not present

## 2023-02-14 DIAGNOSIS — I509 Heart failure, unspecified: Secondary | ICD-10-CM | POA: Diagnosis not present

## 2023-02-14 DIAGNOSIS — Z6841 Body Mass Index (BMI) 40.0 and over, adult: Secondary | ICD-10-CM | POA: Diagnosis not present

## 2023-02-14 DIAGNOSIS — R0602 Shortness of breath: Secondary | ICD-10-CM | POA: Diagnosis not present

## 2023-02-14 DIAGNOSIS — I2510 Atherosclerotic heart disease of native coronary artery without angina pectoris: Secondary | ICD-10-CM | POA: Diagnosis present

## 2023-02-14 DIAGNOSIS — Z89512 Acquired absence of left leg below knee: Secondary | ICD-10-CM | POA: Diagnosis not present

## 2023-02-14 DIAGNOSIS — I5033 Acute on chronic diastolic (congestive) heart failure: Secondary | ICD-10-CM | POA: Diagnosis present

## 2023-02-14 DIAGNOSIS — E119 Type 2 diabetes mellitus without complications: Secondary | ICD-10-CM | POA: Diagnosis not present

## 2023-02-14 DIAGNOSIS — Z7902 Long term (current) use of antithrombotics/antiplatelets: Secondary | ICD-10-CM | POA: Diagnosis not present

## 2023-02-14 DIAGNOSIS — J61 Pneumoconiosis due to asbestos and other mineral fibers: Secondary | ICD-10-CM | POA: Diagnosis present

## 2023-02-14 DIAGNOSIS — J9601 Acute respiratory failure with hypoxia: Secondary | ICD-10-CM | POA: Diagnosis present

## 2023-02-14 DIAGNOSIS — I110 Hypertensive heart disease with heart failure: Secondary | ICD-10-CM | POA: Diagnosis present

## 2023-02-14 DIAGNOSIS — I5023 Acute on chronic systolic (congestive) heart failure: Secondary | ICD-10-CM | POA: Diagnosis not present

## 2023-02-14 DIAGNOSIS — Z79899 Other long term (current) drug therapy: Secondary | ICD-10-CM | POA: Diagnosis not present

## 2023-02-14 DIAGNOSIS — Z8673 Personal history of transient ischemic attack (TIA), and cerebral infarction without residual deficits: Secondary | ICD-10-CM | POA: Diagnosis not present

## 2023-02-14 DIAGNOSIS — E785 Hyperlipidemia, unspecified: Secondary | ICD-10-CM | POA: Diagnosis not present

## 2023-02-14 DIAGNOSIS — S81801A Unspecified open wound, right lower leg, initial encounter: Secondary | ICD-10-CM | POA: Diagnosis not present

## 2023-02-14 DIAGNOSIS — J9600 Acute respiratory failure, unspecified whether with hypoxia or hypercapnia: Secondary | ICD-10-CM | POA: Diagnosis not present

## 2023-02-19 IMAGING — DX DG CHEST 1V PORT
1 series · 1 of 1 positions shown · non-contrast
Comparison: Previous studies including the examination of
10/15/2018

CLINICAL DATA: Shortness of breath

EXAM:
PORTABLE CHEST 1 VIEW

[chest ap]
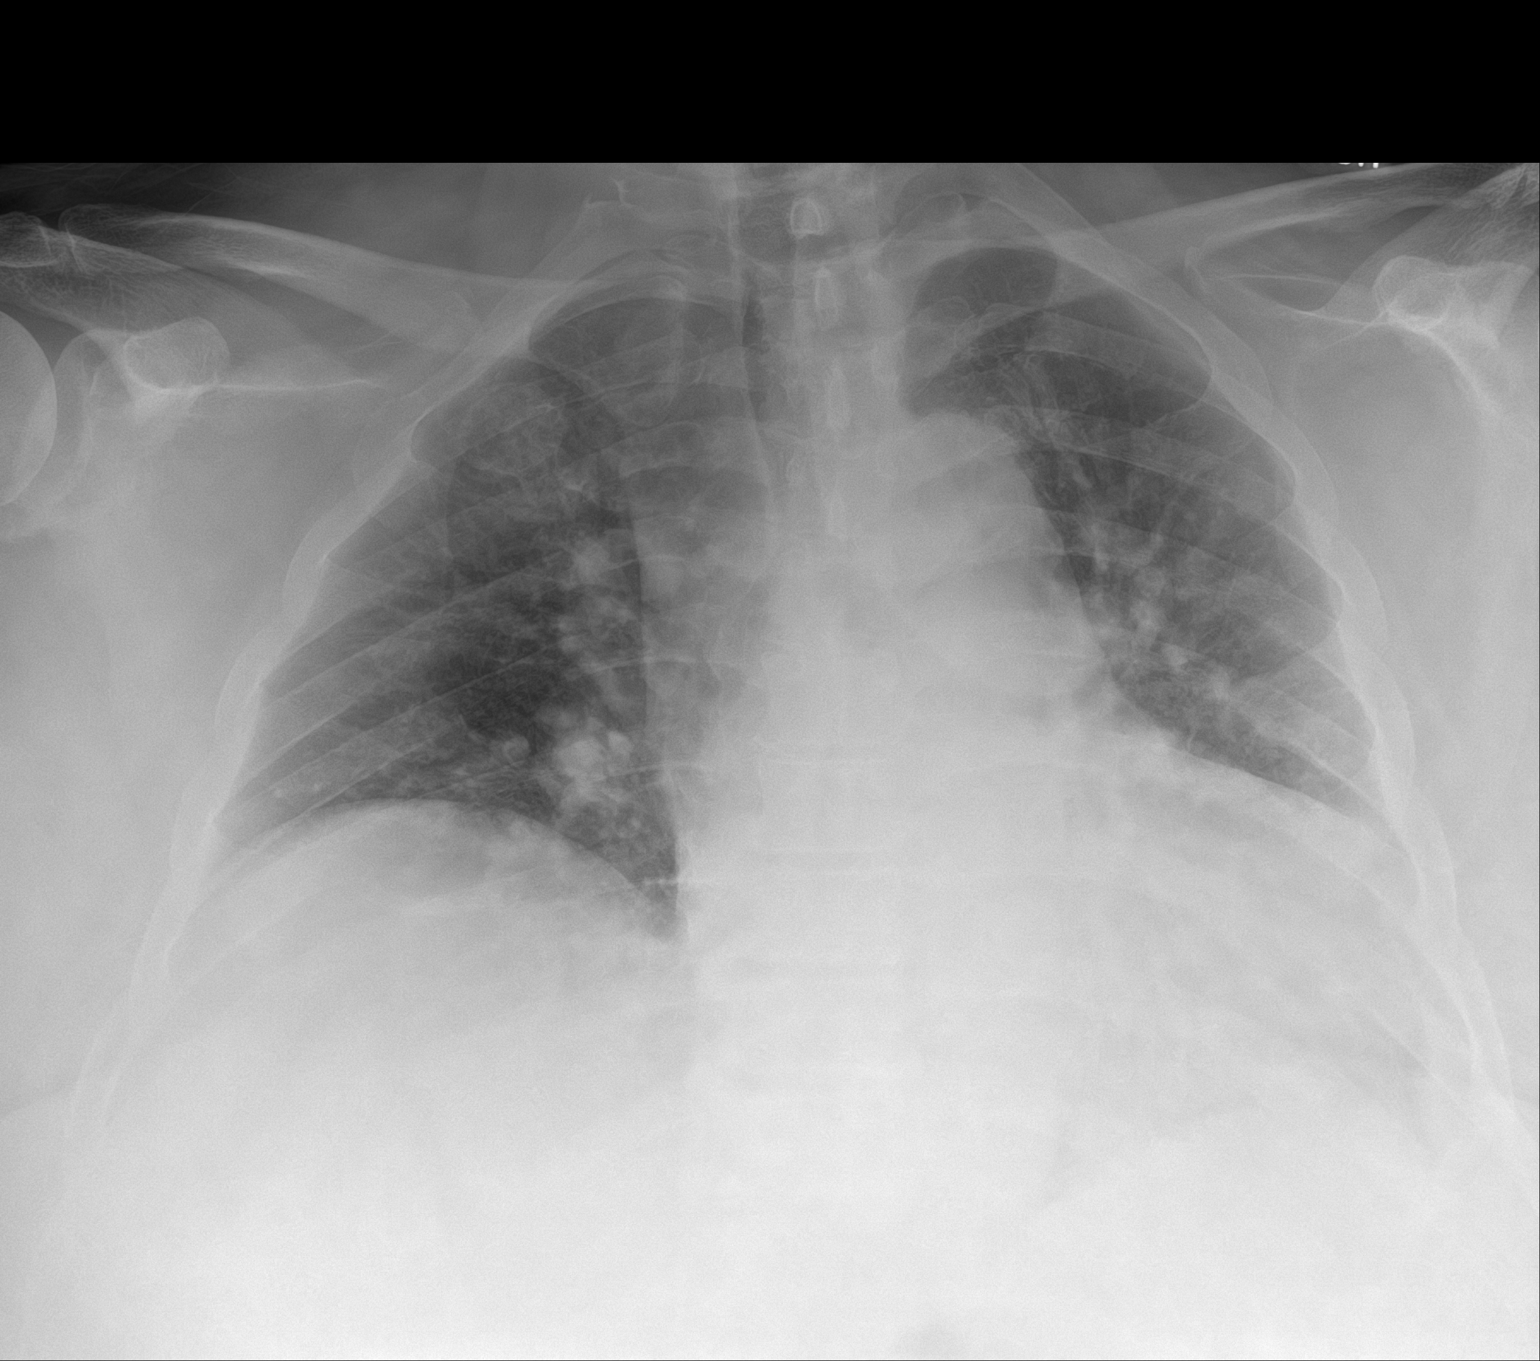

[1 of 1 positions shown; findings below may reference images not displayed]

FINDINGS: Transverse diameter of heart is increased. Central pulmonary vessels
are prominent without signs of alveolar pulmonary edema. Evaluation
of lower lung fields is limited by patient's body habitus. There is
increased density in the left lower lung fields with interval
improvement. There is blunting of left lateral CP angle. There is no
pneumothorax.
IMPRESSION: Cardiomegaly. Central pulmonary vessels are prominent without signs
of pulmonary edema. Increased density in the left lower lung fields
may be due to pleural effusion and possibly underlying infiltrate.
There is interval improvement in aeration of left lower lung fields.

## 2023-02-21 DIAGNOSIS — G64 Other disorders of peripheral nervous system: Secondary | ICD-10-CM | POA: Diagnosis not present

## 2023-02-21 DIAGNOSIS — J9601 Acute respiratory failure with hypoxia: Secondary | ICD-10-CM | POA: Diagnosis not present

## 2023-02-21 DIAGNOSIS — I5022 Chronic systolic (congestive) heart failure: Secondary | ICD-10-CM | POA: Diagnosis not present

## 2023-02-22 DIAGNOSIS — E1121 Type 2 diabetes mellitus with diabetic nephropathy: Secondary | ICD-10-CM | POA: Diagnosis not present

## 2023-02-22 DIAGNOSIS — N390 Urinary tract infection, site not specified: Secondary | ICD-10-CM | POA: Diagnosis not present

## 2023-02-22 DIAGNOSIS — J9601 Acute respiratory failure with hypoxia: Secondary | ICD-10-CM | POA: Diagnosis not present

## 2023-02-22 DIAGNOSIS — I872 Venous insufficiency (chronic) (peripheral): Secondary | ICD-10-CM | POA: Diagnosis not present

## 2023-02-22 DIAGNOSIS — L97212 Non-pressure chronic ulcer of right calf with fat layer exposed: Secondary | ICD-10-CM | POA: Diagnosis not present

## 2023-02-22 DIAGNOSIS — I110 Hypertensive heart disease with heart failure: Secondary | ICD-10-CM | POA: Diagnosis not present

## 2023-02-23 IMAGING — US US RENAL
2 series · 14 of 25 positions shown · non-contrast
Comparison: 03/31/2016

CLINICAL DATA: Renal dysfunction

EXAM:
RENAL / URINARY TRACT ULTRASOUND COMPLETE

[Series 1: us renal · 2 of 4 slices shown]
[im 1/4]
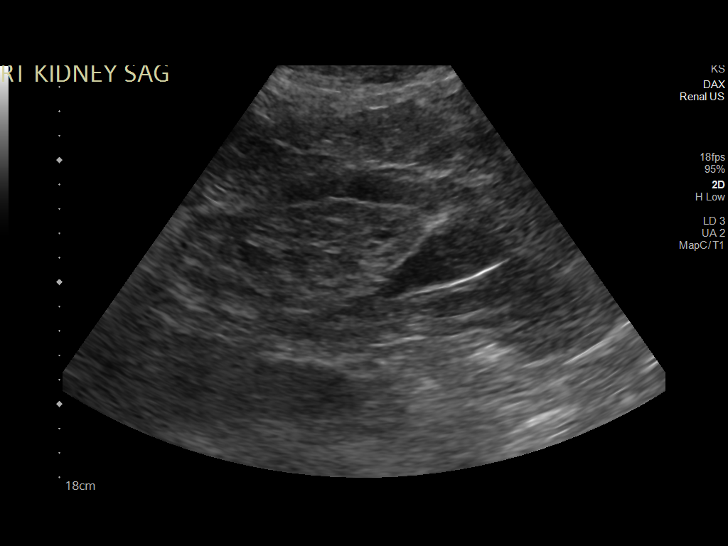
[im 4/4]
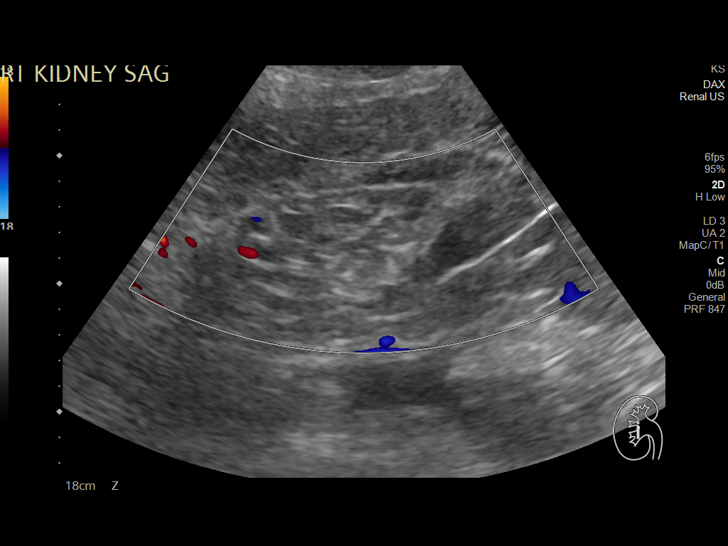

[Series 2: renal us · 35 acquisitions, 12 frames shown]
[im 2/35]
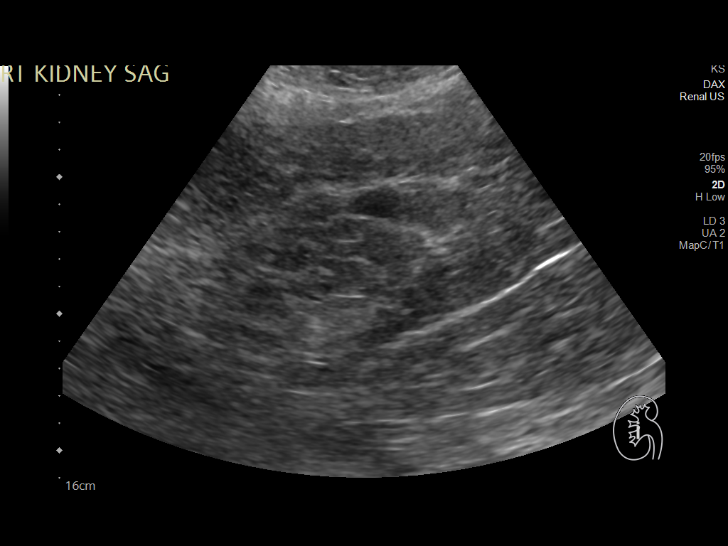
[im 5/35]
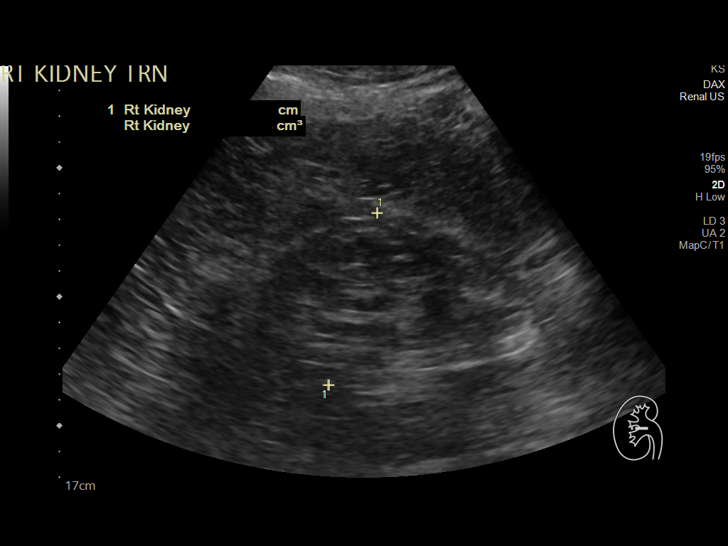
[im 9/35]
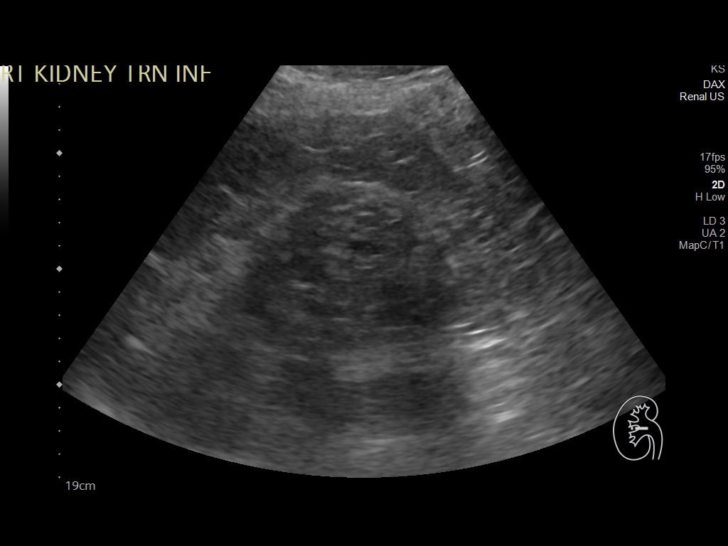
[im 10/35]
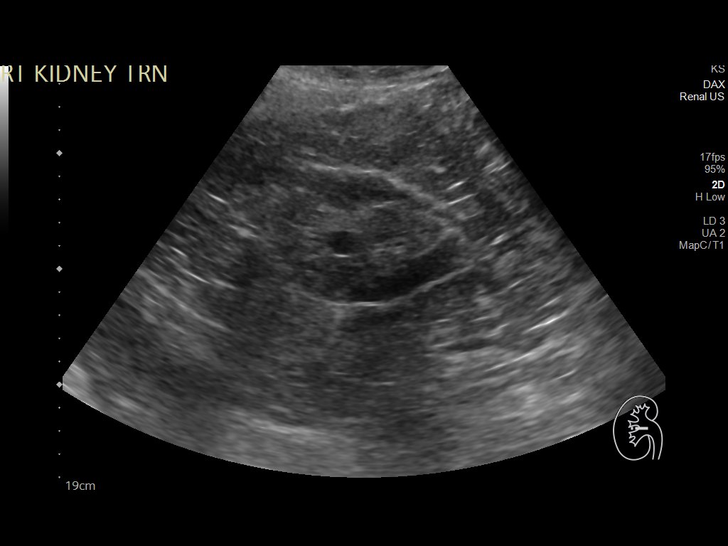
[im 13/35]
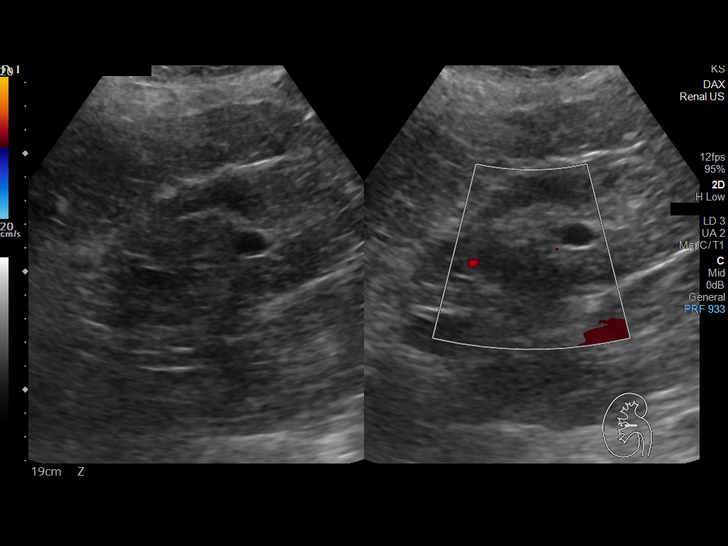
[im 17/35]
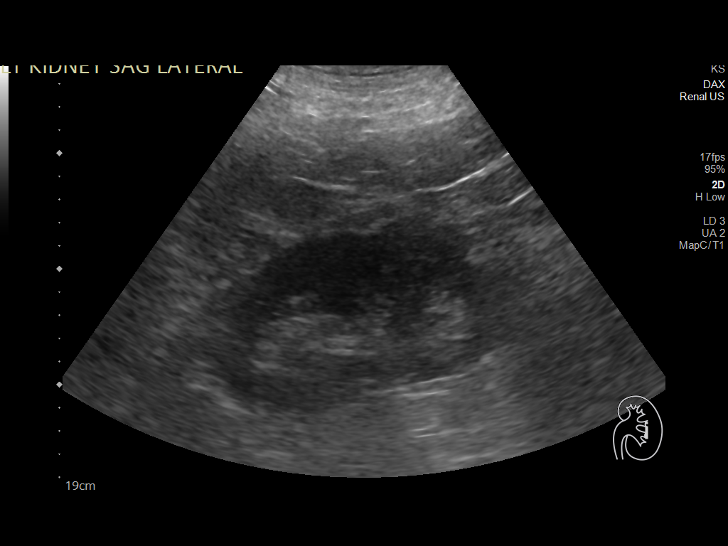
[im 20/35]
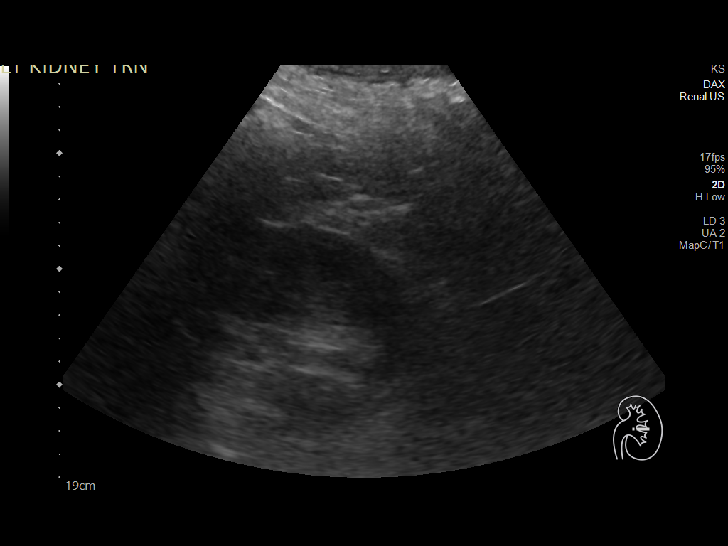
[im 22/35]
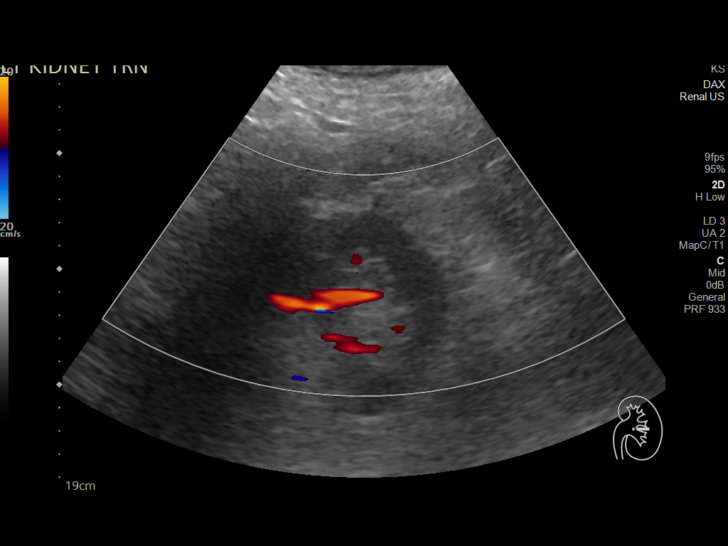
[im 25/35]
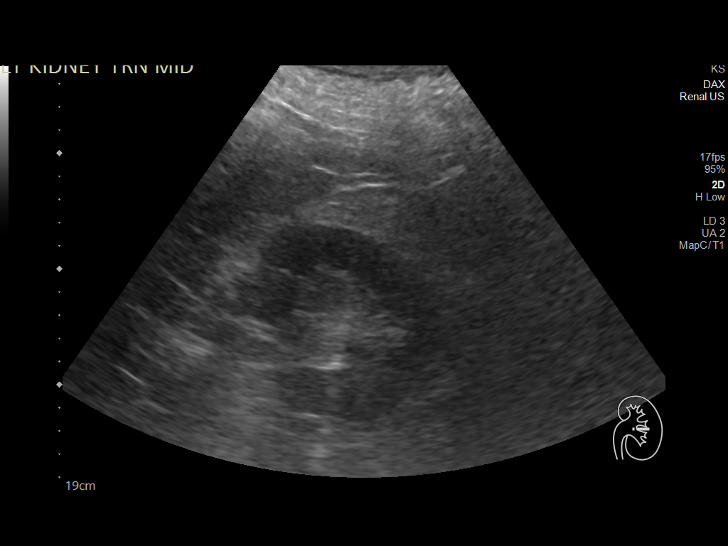
[im 28/35]
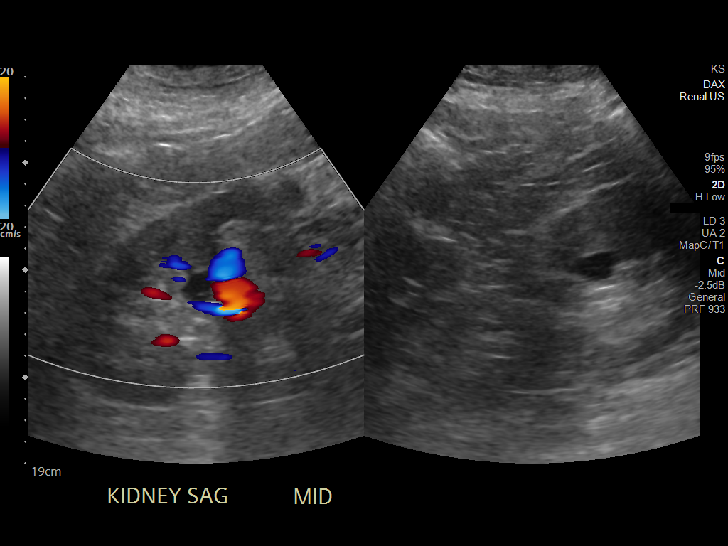
[im 31/35]
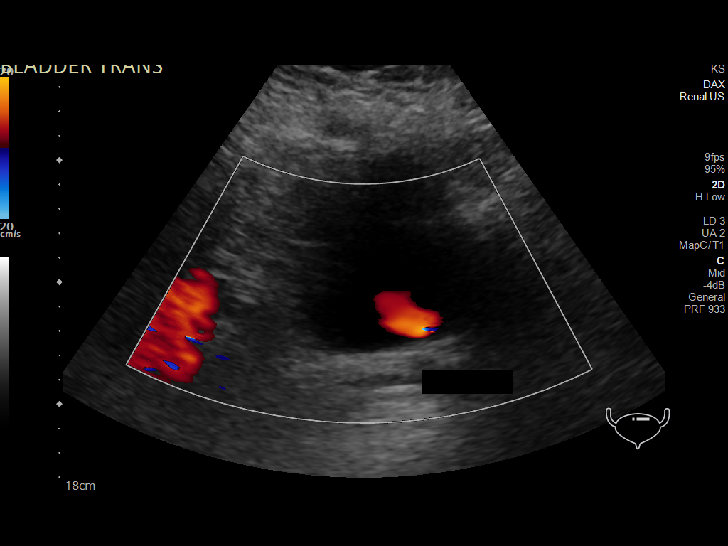
[im 35/35]
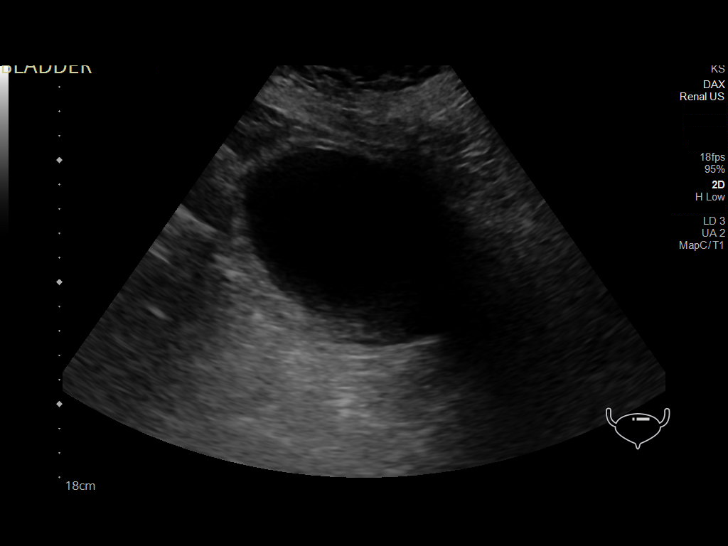

[14 of 25 positions shown; findings below may reference images not displayed]

FINDINGS: Right Kidney:

Renal measurements: 12.3 x 6.8 x 7 cm = volume: 303.38 mL. There is
increased echogenicity. There is no hydronephrosis. There is 1.3 cm
cyst in the midportion.

Left Kidney:

Renal measurements: 15.4 by 8.2 x 9 cm = volume: 594.7 mL. There is
no hydronephrosis. There is increased cortical echogenicity. There
is 1.5 cm cyst in the midportion.

Bladder:

Ureteral jets are observed at both ureterovesical junctions.

Other:

According to the note by the technologist, examination was
technically difficult due to patient's body habitus.
IMPRESSION: There is no hydronephrosis. Increased cortical echogenicity suggests
medical renal disease. Bilateral renal cysts.

## 2023-02-26 ENCOUNTER — Ambulatory Visit (INDEPENDENT_AMBULATORY_CARE_PROVIDER_SITE_OTHER): Payer: Medicare Other | Admitting: Orthopedic Surgery

## 2023-02-26 DIAGNOSIS — L97919 Non-pressure chronic ulcer of unspecified part of right lower leg with unspecified severity: Secondary | ICD-10-CM | POA: Diagnosis not present

## 2023-02-26 DIAGNOSIS — Z89512 Acquired absence of left leg below knee: Secondary | ICD-10-CM

## 2023-02-26 DIAGNOSIS — I83019 Varicose veins of right lower extremity with ulcer of unspecified site: Secondary | ICD-10-CM

## 2023-02-27 DIAGNOSIS — I872 Venous insufficiency (chronic) (peripheral): Secondary | ICD-10-CM | POA: Diagnosis not present

## 2023-02-27 DIAGNOSIS — N390 Urinary tract infection, site not specified: Secondary | ICD-10-CM | POA: Diagnosis not present

## 2023-02-27 DIAGNOSIS — E1121 Type 2 diabetes mellitus with diabetic nephropathy: Secondary | ICD-10-CM | POA: Diagnosis not present

## 2023-02-27 DIAGNOSIS — I110 Hypertensive heart disease with heart failure: Secondary | ICD-10-CM | POA: Diagnosis not present

## 2023-02-27 DIAGNOSIS — J9601 Acute respiratory failure with hypoxia: Secondary | ICD-10-CM | POA: Diagnosis not present

## 2023-02-27 DIAGNOSIS — L97212 Non-pressure chronic ulcer of right calf with fat layer exposed: Secondary | ICD-10-CM | POA: Diagnosis not present

## 2023-02-28 DIAGNOSIS — G4733 Obstructive sleep apnea (adult) (pediatric): Secondary | ICD-10-CM | POA: Diagnosis not present

## 2023-02-28 DIAGNOSIS — I509 Heart failure, unspecified: Secondary | ICD-10-CM | POA: Diagnosis not present

## 2023-02-28 DIAGNOSIS — Z7689 Persons encountering health services in other specified circumstances: Secondary | ICD-10-CM | POA: Diagnosis not present

## 2023-02-28 DIAGNOSIS — J9601 Acute respiratory failure with hypoxia: Secondary | ICD-10-CM | POA: Diagnosis not present

## 2023-03-02 DIAGNOSIS — J9601 Acute respiratory failure with hypoxia: Secondary | ICD-10-CM | POA: Diagnosis not present

## 2023-03-02 DIAGNOSIS — N390 Urinary tract infection, site not specified: Secondary | ICD-10-CM | POA: Diagnosis not present

## 2023-03-02 DIAGNOSIS — L97212 Non-pressure chronic ulcer of right calf with fat layer exposed: Secondary | ICD-10-CM | POA: Diagnosis not present

## 2023-03-02 DIAGNOSIS — I110 Hypertensive heart disease with heart failure: Secondary | ICD-10-CM | POA: Diagnosis not present

## 2023-03-02 DIAGNOSIS — E1121 Type 2 diabetes mellitus with diabetic nephropathy: Secondary | ICD-10-CM | POA: Diagnosis not present

## 2023-03-02 DIAGNOSIS — I872 Venous insufficiency (chronic) (peripheral): Secondary | ICD-10-CM | POA: Diagnosis not present

## 2023-03-03 DIAGNOSIS — L02811 Cutaneous abscess of head [any part, except face]: Secondary | ICD-10-CM | POA: Diagnosis not present

## 2023-03-05 DIAGNOSIS — E1121 Type 2 diabetes mellitus with diabetic nephropathy: Secondary | ICD-10-CM | POA: Diagnosis not present

## 2023-03-05 DIAGNOSIS — N390 Urinary tract infection, site not specified: Secondary | ICD-10-CM | POA: Diagnosis not present

## 2023-03-05 DIAGNOSIS — J9601 Acute respiratory failure with hypoxia: Secondary | ICD-10-CM | POA: Diagnosis not present

## 2023-03-05 DIAGNOSIS — I110 Hypertensive heart disease with heart failure: Secondary | ICD-10-CM | POA: Diagnosis not present

## 2023-03-05 DIAGNOSIS — L97212 Non-pressure chronic ulcer of right calf with fat layer exposed: Secondary | ICD-10-CM | POA: Diagnosis not present

## 2023-03-05 DIAGNOSIS — I872 Venous insufficiency (chronic) (peripheral): Secondary | ICD-10-CM | POA: Diagnosis not present

## 2023-03-06 ENCOUNTER — Encounter: Payer: Self-pay | Admitting: Orthopedic Surgery

## 2023-03-06 NOTE — Progress Notes (Signed)
Office Visit Note   Patient: [name] [name]           Date of Birth: 09/15/1956           MRN: [id number] Visit Date: 02/26/2023              Requested by: Sasser, Clarene Critchley, MD [address] [place],  [place] [address] PCP: Estanislado Pandy, MD  Chief Complaint  Patient presents with   Right Leg - Wound Check      HPI: Patient is a 67 year old gentleman who is 4 months status post left transtibial amputation and has a right leg ulcer.  Patient currently has home health nursing applying compression wraps to the right leg 2 times a week.  Assessment & Plan: Visit Diagnoses:  1. Left below-knee amputee (HCC)   2. Venous ulcer of right leg (HCC)     Plan: Continue with home health nursing multilayer compression wraps.  Leg rewrapped with Dynaflex today.  Recommended elevation.  Follow-Up Instructions: Return in about 4 weeks (around 03/26/2023).   Ortho Exam  Patient is alert, oriented, no adenopathy, well-dressed, normal affect, normal respiratory effort. Examination patient is short of breath while seated he is on 3 L nasal cannula oxygen.  The left transtibial amputation is stable no ulcers.  Examination of the right leg there is decreased swelling in the calf and no ulcers.  Patient does have a blister inferior to the patella from swelling he has swelling in the foot and toes.  Patient states he has been hospitalized secondary to respiratory failure recently.  He is on 2 fluid pills.  Imaging: No results found. No images are attached to the encounter.  Labs: Lab Results  Component Value Date   HGBA1C 7.3 (H) 10/17/2021   HGBA1C 6.5 (H) 09/12/2019   CRP 17.6 (H) 09/14/2019   CRP 14.7 (H) 09/13/2019   CRP 14.6 (H) 09/12/2019   REPTSTATUS 11/20/2021 FINAL 11/17/2021   CULT (A) 11/17/2021    >=100,000 COLONIES/mL ESCHERICHIA COLI >=100,000 COLONIES/mL KLEBSIELLA OXYTOCA    LABORGA ESCHERICHIA COLI (A) 11/17/2021   LABORGA KLEBSIELLA OXYTOCA (A) 11/17/2021     Lab  Results  Component Value Date   ALBUMIN 2.6 (L) 11/10/2021   ALBUMIN 1.8 (L) 10/25/2021   ALBUMIN 2.5 (L) 10/14/2021    Lab Results  Component Value Date   MG 2.1 09/14/2019   MG 2.3 09/13/2019   MG 1.9 09/12/2019   No results found for: "VD25OH"  No results found for: "PREALBUMIN"    Latest Ref Rng & Units 12/21/2021    9:53 AM 11/10/2021    6:54 AM 10/31/2021    5:00 AM  CBC EXTENDED  WBC 4.0 - 10.5 K/uL 7.6  6.5  7.9   RBC 4.22 - 5.81 Mil/uL 4.57  3.88  3.22   Hemoglobin 13.0 - 17.0 g/dL 01.0  9.5  7.8   HCT 93.2 - 52.0 % 35.6  33.0  26.5   Platelets 150.0 - 400.0 K/uL 230.0  176  243   NEUT# 1.4 - 7.7 K/uL 5.3  4.3    Lymph# 0.7 - 4.0 K/uL 0.9  0.8       There is no height or weight on file to calculate BMI.  Orders:  No orders of the defined types were placed in this encounter.  No orders of the defined types were placed in this encounter.    Procedures: No procedures performed  Clinical Data: No additional findings.  ROS:  All other systems negative, except as noted in the HPI. Review of Systems  Objective: Vital Signs: There were no vitals taken for this visit.  Specialty Comments:  No specialty comments available.  PMFS History: Patient Active Problem List   Diagnosis Date Noted   Acute on chronic diastolic (congestive) heart failure (HCC)    Controlled type 2 diabetes mellitus with hyperglycemia, without long-term current use of insulin (HCC)    Phantom limb pain (HCC)    Left below-knee amputee (HCC) 10/25/2021   Left leg pain    Osteomyelitis of ankle and foot (HCC) 10/15/2021   Anemia 10/15/2021   Pressure ulcer 10/15/2021   History of CVA (cerebrovascular accident) 10/15/2021   Septic arthritis of ankle and foot region (HCC) 10/15/2021   AKI (acute kidney injury) (HCC) 10/14/2021   Pneumonia due to COVID-19 virus 09/09/2019   Hypokalemia 09/09/2019   Acute respiratory failure with hypoxia (HCC) 09/09/2019   Essential hypertension  10/14/2014   Chronic diastolic heart failure (HCC) 10/14/2014   Morbid obesity (HCC) 07/22/2013   Varicose veins of lower extremities with other complications 07/22/2013   OSA (obstructive sleep apnea) 07/22/2013   Venous stasis ulcers (HCC) 07/22/2013   Diabetes mellitus (HCC) 01/03/2011   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HCC)    Diverticulosis    Essential hypertension    Gastritis    History of nephrolithiasis    History of open leg wound    History of stroke 03/19/2003   Lymphedema    Obstructive sleep apnea    Peptic ulcer    TIA (transient ischemic attack)    Varicose veins    Laser ablation of left great saphenous vein 2009    Family History  Problem Relation Age of Onset   Bone cancer Mother    Hypertension Father    Pancreatic cancer Maternal Grandmother    Stroke Paternal Grandmother    Diabetes Paternal Grandfather    Heart attack Brother    Hypertension Brother    Heart attack Sister     Past Surgical History:  Procedure Laterality Date   AMPUTATION Left 10/21/2021   Procedure: LEFT BELOW KNEE AMPUTATION;  Surgeon: Nadara Mustard, MD;  Location: [facility] OR;  Service: Orthopedics;  Laterality: Left;   BIOPSY  10/19/2021   Procedure: BIOPSY;  Surgeon: Imogene Burn, MD;  Location: [facility] ENDOSCOPY;  Service: Gastroenterology;;   COLONOSCOPY N/A 10/19/2021   Procedure: COLONOSCOPY;  Surgeon: Imogene Burn, MD;  Location: [facility] ENDOSCOPY;  Service: Gastroenterology;  Laterality: N/A;   CYSTOSCOPY WITH RETROGRADE PYELOGRAM, URETEROSCOPY AND STENT PLACEMENT Right 08/02/2015   Procedure: CYSTOSCOPY WITH RETROGRADE PYELOGRAM, URETEROSCOPY , STONE EXTRACTION AND STENT PLACEMENT;  Surgeon: Malen Gauze, MD;  Location: [facility]S;  Service: Urology;  Laterality: Right;   ESOPHAGOGASTRODUODENOSCOPY (EGD) WITH PROPOFOL N/A 10/19/2021   Procedure: ESOPHAGOGASTRODUODENOSCOPY (EGD) WITH PROPOFOL;  Surgeon: Imogene Burn, MD;  Location: [facility]  ENDOSCOPY;  Service: Gastroenterology;  Laterality: N/A;   HOLMIUM LASER APPLICATION Right 08/02/2015   Procedure: HOLMIUM LASER APPLICATION;  Surgeon: Malen Gauze, MD;  Location: [facility] ORS;  Service: Urology;  Laterality: Right;   KIDNEY STONE SURGERY     POLYPECTOMY  10/19/2021   Procedure: POLYPECTOMY;  Surgeon: Imogene Burn, MD;  Location: [facility] ENDOSCOPY;  Service: Gastroenterology;;   VASECTOMY  1984   VEIN SURGERY Left 06/2010   Laser ablation   Social History   Occupational History  Occupation: Dealer: [employer]  Tobacco Use   Smoking status: Never    Passive exposure: Never   Smokeless tobacco: Never  Vaping Use   Vaping Use: Never used  Substance and Sexual Activity   Alcohol use: No    Alcohol/week: 0.0 standard drinks of alcohol   Drug use: No   Sexual activity: Not on file

## 2023-03-08 DIAGNOSIS — N390 Urinary tract infection, site not specified: Secondary | ICD-10-CM | POA: Diagnosis not present

## 2023-03-08 DIAGNOSIS — I872 Venous insufficiency (chronic) (peripheral): Secondary | ICD-10-CM | POA: Diagnosis not present

## 2023-03-08 DIAGNOSIS — I110 Hypertensive heart disease with heart failure: Secondary | ICD-10-CM | POA: Diagnosis not present

## 2023-03-08 DIAGNOSIS — E1121 Type 2 diabetes mellitus with diabetic nephropathy: Secondary | ICD-10-CM | POA: Diagnosis not present

## 2023-03-08 DIAGNOSIS — L97212 Non-pressure chronic ulcer of right calf with fat layer exposed: Secondary | ICD-10-CM | POA: Diagnosis not present

## 2023-03-08 DIAGNOSIS — J9601 Acute respiratory failure with hypoxia: Secondary | ICD-10-CM | POA: Diagnosis not present

## 2023-03-12 DIAGNOSIS — I110 Hypertensive heart disease with heart failure: Secondary | ICD-10-CM | POA: Diagnosis not present

## 2023-03-12 DIAGNOSIS — N200 Calculus of kidney: Secondary | ICD-10-CM | POA: Diagnosis not present

## 2023-03-12 DIAGNOSIS — D649 Anemia, unspecified: Secondary | ICD-10-CM | POA: Diagnosis not present

## 2023-03-12 DIAGNOSIS — E7800 Pure hypercholesterolemia, unspecified: Secondary | ICD-10-CM | POA: Diagnosis not present

## 2023-03-12 DIAGNOSIS — Z7982 Long term (current) use of aspirin: Secondary | ICD-10-CM | POA: Diagnosis not present

## 2023-03-12 DIAGNOSIS — Z7902 Long term (current) use of antithrombotics/antiplatelets: Secondary | ICD-10-CM | POA: Diagnosis not present

## 2023-03-12 DIAGNOSIS — I872 Venous insufficiency (chronic) (peripheral): Secondary | ICD-10-CM | POA: Diagnosis not present

## 2023-03-12 DIAGNOSIS — Z8673 Personal history of transient ischemic attack (TIA), and cerebral infarction without residual deficits: Secondary | ICD-10-CM | POA: Diagnosis not present

## 2023-03-12 DIAGNOSIS — I5033 Acute on chronic diastolic (congestive) heart failure: Secondary | ICD-10-CM | POA: Diagnosis not present

## 2023-03-12 DIAGNOSIS — J9601 Acute respiratory failure with hypoxia: Secondary | ICD-10-CM | POA: Diagnosis not present

## 2023-03-12 DIAGNOSIS — Z794 Long term (current) use of insulin: Secondary | ICD-10-CM | POA: Diagnosis not present

## 2023-03-12 DIAGNOSIS — L97212 Non-pressure chronic ulcer of right calf with fat layer exposed: Secondary | ICD-10-CM | POA: Diagnosis not present

## 2023-03-12 DIAGNOSIS — E1121 Type 2 diabetes mellitus with diabetic nephropathy: Secondary | ICD-10-CM | POA: Diagnosis not present

## 2023-03-12 DIAGNOSIS — Z89512 Acquired absence of left leg below knee: Secondary | ICD-10-CM | POA: Diagnosis not present

## 2023-03-13 ENCOUNTER — Ambulatory Visit: Payer: Medicare Other | Admitting: Nurse Practitioner

## 2023-03-13 DIAGNOSIS — I872 Venous insufficiency (chronic) (peripheral): Secondary | ICD-10-CM | POA: Diagnosis not present

## 2023-03-13 DIAGNOSIS — L97212 Non-pressure chronic ulcer of right calf with fat layer exposed: Secondary | ICD-10-CM | POA: Diagnosis not present

## 2023-03-13 DIAGNOSIS — I5033 Acute on chronic diastolic (congestive) heart failure: Secondary | ICD-10-CM | POA: Diagnosis not present

## 2023-03-13 DIAGNOSIS — J9601 Acute respiratory failure with hypoxia: Secondary | ICD-10-CM | POA: Diagnosis not present

## 2023-03-13 DIAGNOSIS — E1121 Type 2 diabetes mellitus with diabetic nephropathy: Secondary | ICD-10-CM | POA: Diagnosis not present

## 2023-03-13 DIAGNOSIS — I110 Hypertensive heart disease with heart failure: Secondary | ICD-10-CM | POA: Diagnosis not present

## 2023-03-14 DIAGNOSIS — I358 Other nonrheumatic aortic valve disorders: Secondary | ICD-10-CM | POA: Diagnosis not present

## 2023-03-14 DIAGNOSIS — I517 Cardiomegaly: Secondary | ICD-10-CM | POA: Diagnosis not present

## 2023-03-16 DIAGNOSIS — I110 Hypertensive heart disease with heart failure: Secondary | ICD-10-CM | POA: Diagnosis not present

## 2023-03-16 DIAGNOSIS — J9601 Acute respiratory failure with hypoxia: Secondary | ICD-10-CM | POA: Diagnosis not present

## 2023-03-16 DIAGNOSIS — I5033 Acute on chronic diastolic (congestive) heart failure: Secondary | ICD-10-CM | POA: Diagnosis not present

## 2023-03-16 DIAGNOSIS — E1121 Type 2 diabetes mellitus with diabetic nephropathy: Secondary | ICD-10-CM | POA: Diagnosis not present

## 2023-03-16 DIAGNOSIS — L97212 Non-pressure chronic ulcer of right calf with fat layer exposed: Secondary | ICD-10-CM | POA: Diagnosis not present

## 2023-03-16 DIAGNOSIS — I872 Venous insufficiency (chronic) (peripheral): Secondary | ICD-10-CM | POA: Diagnosis not present

## 2023-03-20 DIAGNOSIS — I872 Venous insufficiency (chronic) (peripheral): Secondary | ICD-10-CM | POA: Diagnosis not present

## 2023-03-20 DIAGNOSIS — L97212 Non-pressure chronic ulcer of right calf with fat layer exposed: Secondary | ICD-10-CM | POA: Diagnosis not present

## 2023-03-20 DIAGNOSIS — I110 Hypertensive heart disease with heart failure: Secondary | ICD-10-CM | POA: Diagnosis not present

## 2023-03-20 DIAGNOSIS — J9601 Acute respiratory failure with hypoxia: Secondary | ICD-10-CM | POA: Diagnosis not present

## 2023-03-20 DIAGNOSIS — E1121 Type 2 diabetes mellitus with diabetic nephropathy: Secondary | ICD-10-CM | POA: Diagnosis not present

## 2023-03-20 DIAGNOSIS — I5033 Acute on chronic diastolic (congestive) heart failure: Secondary | ICD-10-CM | POA: Diagnosis not present

## 2023-03-23 DIAGNOSIS — J9601 Acute respiratory failure with hypoxia: Secondary | ICD-10-CM | POA: Diagnosis not present

## 2023-03-23 DIAGNOSIS — E1121 Type 2 diabetes mellitus with diabetic nephropathy: Secondary | ICD-10-CM | POA: Diagnosis not present

## 2023-03-23 DIAGNOSIS — L97212 Non-pressure chronic ulcer of right calf with fat layer exposed: Secondary | ICD-10-CM | POA: Diagnosis not present

## 2023-03-23 DIAGNOSIS — I872 Venous insufficiency (chronic) (peripheral): Secondary | ICD-10-CM | POA: Diagnosis not present

## 2023-03-23 DIAGNOSIS — I5033 Acute on chronic diastolic (congestive) heart failure: Secondary | ICD-10-CM | POA: Diagnosis not present

## 2023-03-23 DIAGNOSIS — I110 Hypertensive heart disease with heart failure: Secondary | ICD-10-CM | POA: Diagnosis not present

## 2023-03-26 ENCOUNTER — Ambulatory Visit (INDEPENDENT_AMBULATORY_CARE_PROVIDER_SITE_OTHER): Payer: Medicare Other | Admitting: Orthopedic Surgery

## 2023-03-26 DIAGNOSIS — Z89512 Acquired absence of left leg below knee: Secondary | ICD-10-CM

## 2023-03-26 DIAGNOSIS — L97911 Non-pressure chronic ulcer of unspecified part of right lower leg limited to breakdown of skin: Secondary | ICD-10-CM

## 2023-03-26 DIAGNOSIS — I83019 Varicose veins of right lower extremity with ulcer of unspecified site: Secondary | ICD-10-CM | POA: Diagnosis not present

## 2023-03-27 ENCOUNTER — Encounter: Payer: Self-pay | Admitting: Orthopedic Surgery

## 2023-03-27 NOTE — Progress Notes (Signed)
Office Visit Note   Patient: [name] [name]           Date of Birth: 1956/02/18           MRN: [id number] Visit Date: 03/26/2023              Requested by: Sasser, Clarene Critchley, MD [address] [place],  [place] [address] PCP: Estanislado Pandy, MD  Chief Complaint  Patient presents with   Right Leg - Wound Check      HPI: Patient is a 67 year old gentleman who is status post left transtibial amputation currently wearing a compression sleeve and a right leg ulceration currently treated with compression wraps.  Currently home health nursing performing compression wraps Tuesday and Friday.  Patient is in a motorized chair and has difficulty with elevating his leg.  Patient recently slid off his sliding board and had to call EMS to get him off the floor.  Assessment & Plan: Visit Diagnoses:  1. Left below-knee amputee (HCC)   2. Venous ulcer of right leg (HCC)     Plan: Patient will continue with serial compression wraps work on elevation.  Patient is provided a prescription for a [place] type lift at home.  Patient is currently unstable with transfer training on his own.  Follow-Up Instructions: Return in about 4 weeks (around 04/23/2023).   Ortho Exam  Patient is alert, oriented, no adenopathy, well-dressed, normal affect, normal respiratory effort. Examination the right leg shows decreased edema but there is still significant venous and lymphatic swelling.  While the leg has improved there is still extensive swelling in the foot.  There is weeping edema from the foot and leg but no cellulitis.  Dynaflex wrap applied to the right leg.  Imaging: No results found. No images are attached to the encounter.  Labs: Lab Results  Component Value Date   HGBA1C 7.3 (H) 10/17/2021   HGBA1C 6.5 (H) 09/12/2019   CRP 17.6 (H) 09/14/2019   CRP 14.7 (H) 09/13/2019   CRP 14.6 (H) 09/12/2019   REPTSTATUS 11/20/2021 FINAL 11/17/2021   CULT (A) 11/17/2021    >=100,000 COLONIES/mL ESCHERICHIA  COLI >=100,000 COLONIES/mL KLEBSIELLA OXYTOCA    LABORGA ESCHERICHIA COLI (A) 11/17/2021   LABORGA KLEBSIELLA OXYTOCA (A) 11/17/2021     Lab Results  Component Value Date   ALBUMIN 2.6 (L) 11/10/2021   ALBUMIN 1.8 (L) 10/25/2021   ALBUMIN 2.5 (L) 10/14/2021    Lab Results  Component Value Date   MG 2.1 09/14/2019   MG 2.3 09/13/2019   MG 1.9 09/12/2019   No results found for: "VD25OH"  No results found for: "PREALBUMIN"    Latest Ref Rng & Units 12/21/2021    9:53 AM 11/10/2021    6:54 AM 10/31/2021    5:00 AM  CBC EXTENDED  WBC 4.0 - 10.5 K/uL 7.6  6.5  7.9   RBC 4.22 - 5.81 Mil/uL 4.57  3.88  3.22   Hemoglobin 13.0 - 17.0 g/dL 84.6  9.5  7.8   HCT 96.2 - 52.0 % 35.6  33.0  26.5   Platelets 150.0 - 400.0 K/uL 230.0  176  243   NEUT# 1.4 - 7.7 K/uL 5.3  4.3    Lymph# 0.7 - 4.0 K/uL 0.9  0.8       There is no height or weight on file to calculate BMI.  Orders:  No orders of the defined types were placed in this encounter.  No orders of  the defined types were placed in this encounter.    Procedures: No procedures performed  Clinical Data: No additional findings.  ROS:  All other systems negative, except as noted in the HPI. Review of Systems  Objective: Vital Signs: There were no vitals taken for this visit.  Specialty Comments:  No specialty comments available.  PMFS History: Patient Active Problem List   Diagnosis Date Noted   Acute on chronic diastolic (congestive) heart failure (HCC)    Controlled type 2 diabetes mellitus with hyperglycemia, without long-term current use of insulin (HCC)    Phantom limb pain (HCC)    Left below-knee amputee (HCC) 10/25/2021   Left leg pain    Osteomyelitis of ankle and foot (HCC) 10/15/2021   Anemia 10/15/2021   Pressure ulcer 10/15/2021   History of CVA (cerebrovascular accident) 10/15/2021   Septic arthritis of ankle and foot region (HCC) 10/15/2021   AKI (acute kidney injury) (HCC) 10/14/2021   Pneumonia  due to COVID-19 virus 09/09/2019   Hypokalemia 09/09/2019   Acute respiratory failure with hypoxia (HCC) 09/09/2019   Essential hypertension 10/14/2014   Chronic diastolic heart failure (HCC) 10/14/2014   Morbid obesity (HCC) 07/22/2013   Varicose veins of lower extremities with other complications 07/22/2013   OSA (obstructive sleep apnea) 07/22/2013   Venous stasis ulcers (HCC) 07/22/2013   Diabetes mellitus (HCC) 01/03/2011   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HCC)    Diverticulosis    Essential hypertension    Gastritis    History of nephrolithiasis    History of open leg wound    History of stroke 03/19/2003   Lymphedema    Obstructive sleep apnea    Peptic ulcer    TIA (transient ischemic attack)    Varicose veins    Laser ablation of left great saphenous vein 2009    Family History  Problem Relation Age of Onset   Bone cancer Mother    Hypertension Father    Pancreatic cancer Maternal Grandmother    Stroke Paternal Grandmother    Diabetes Paternal Grandfather    Heart attack Brother    Hypertension Brother    Heart attack Sister     Past Surgical History:  Procedure Laterality Date   AMPUTATION Left 10/21/2021   Procedure: LEFT BELOW KNEE AMPUTATION;  Surgeon: Nadara Mustard, MD;  Location: [facility] OR;  Service: Orthopedics;  Laterality: Left;   BIOPSY  10/19/2021   Procedure: BIOPSY;  Surgeon: Imogene Burn, MD;  Location: [facility] ENDOSCOPY;  Service: Gastroenterology;;   COLONOSCOPY N/A 10/19/2021   Procedure: COLONOSCOPY;  Surgeon: Imogene Burn, MD;  Location: [facility] ENDOSCOPY;  Service: Gastroenterology;  Laterality: N/A;   CYSTOSCOPY WITH RETROGRADE PYELOGRAM, URETEROSCOPY AND STENT PLACEMENT Right 08/02/2015   Procedure: CYSTOSCOPY WITH RETROGRADE PYELOGRAM, URETEROSCOPY , STONE EXTRACTION AND STENT PLACEMENT;  Surgeon: Malen Gauze, MD;  Location: [facility];  Service: Urology;  Laterality: Right;   ESOPHAGOGASTRODUODENOSCOPY  (EGD) WITH PROPOFOL N/A 10/19/2021   Procedure: ESOPHAGOGASTRODUODENOSCOPY (EGD) WITH PROPOFOL;  Surgeon: Imogene Burn, MD;  Location: [facility] ENDOSCOPY;  Service: Gastroenterology;  Laterality: N/A;   HOLMIUM LASER APPLICATION Right 08/02/2015   Procedure: HOLMIUM LASER APPLICATION;  Surgeon: Malen Gauze, MD;  Location: [facility];  Service: Urology;  Laterality: Right;   KIDNEY STONE SURGERY     POLYPECTOMY  10/19/2021   Procedure: POLYPECTOMY;  Surgeon: Imogene Burn, MD;  Location: [facility] ENDOSCOPY;  Service: Gastroenterology;;  VASECTOMY  1984   VEIN SURGERY Left 06/2010   Laser ablation   Social History   Occupational History   Occupation: Water quality scientist     Employer: [employer]  Tobacco Use   Smoking status: Never    Passive exposure: Never   Smokeless tobacco: Never  Vaping Use   Vaping Use: Never used  Substance and Sexual Activity   Alcohol use: No    Alcohol/week: 0.0 standard drinks of alcohol   Drug use: No   Sexual activity: Not on file

## 2023-03-30 DIAGNOSIS — I110 Hypertensive heart disease with heart failure: Secondary | ICD-10-CM | POA: Diagnosis not present

## 2023-03-30 DIAGNOSIS — L97212 Non-pressure chronic ulcer of right calf with fat layer exposed: Secondary | ICD-10-CM | POA: Diagnosis not present

## 2023-03-30 DIAGNOSIS — I872 Venous insufficiency (chronic) (peripheral): Secondary | ICD-10-CM | POA: Diagnosis not present

## 2023-03-30 DIAGNOSIS — I5033 Acute on chronic diastolic (congestive) heart failure: Secondary | ICD-10-CM | POA: Diagnosis not present

## 2023-03-30 DIAGNOSIS — E1121 Type 2 diabetes mellitus with diabetic nephropathy: Secondary | ICD-10-CM | POA: Diagnosis not present

## 2023-03-30 DIAGNOSIS — J9601 Acute respiratory failure with hypoxia: Secondary | ICD-10-CM | POA: Diagnosis not present

## 2023-04-03 DIAGNOSIS — I872 Venous insufficiency (chronic) (peripheral): Secondary | ICD-10-CM | POA: Diagnosis not present

## 2023-04-03 DIAGNOSIS — L97212 Non-pressure chronic ulcer of right calf with fat layer exposed: Secondary | ICD-10-CM | POA: Diagnosis not present

## 2023-04-03 DIAGNOSIS — I5033 Acute on chronic diastolic (congestive) heart failure: Secondary | ICD-10-CM | POA: Diagnosis not present

## 2023-04-03 DIAGNOSIS — E1121 Type 2 diabetes mellitus with diabetic nephropathy: Secondary | ICD-10-CM | POA: Diagnosis not present

## 2023-04-03 DIAGNOSIS — J9601 Acute respiratory failure with hypoxia: Secondary | ICD-10-CM | POA: Diagnosis not present

## 2023-04-03 DIAGNOSIS — I110 Hypertensive heart disease with heart failure: Secondary | ICD-10-CM | POA: Diagnosis not present

## 2023-04-06 DIAGNOSIS — I5033 Acute on chronic diastolic (congestive) heart failure: Secondary | ICD-10-CM | POA: Diagnosis not present

## 2023-04-06 DIAGNOSIS — J9601 Acute respiratory failure with hypoxia: Secondary | ICD-10-CM | POA: Diagnosis not present

## 2023-04-06 DIAGNOSIS — I872 Venous insufficiency (chronic) (peripheral): Secondary | ICD-10-CM | POA: Diagnosis not present

## 2023-04-06 DIAGNOSIS — L97212 Non-pressure chronic ulcer of right calf with fat layer exposed: Secondary | ICD-10-CM | POA: Diagnosis not present

## 2023-04-06 DIAGNOSIS — I110 Hypertensive heart disease with heart failure: Secondary | ICD-10-CM | POA: Diagnosis not present

## 2023-04-06 DIAGNOSIS — E1121 Type 2 diabetes mellitus with diabetic nephropathy: Secondary | ICD-10-CM | POA: Diagnosis not present

## 2023-04-09 DIAGNOSIS — I5033 Acute on chronic diastolic (congestive) heart failure: Secondary | ICD-10-CM | POA: Diagnosis not present

## 2023-04-09 DIAGNOSIS — I872 Venous insufficiency (chronic) (peripheral): Secondary | ICD-10-CM | POA: Diagnosis not present

## 2023-04-09 DIAGNOSIS — I110 Hypertensive heart disease with heart failure: Secondary | ICD-10-CM | POA: Diagnosis not present

## 2023-04-09 DIAGNOSIS — J9601 Acute respiratory failure with hypoxia: Secondary | ICD-10-CM | POA: Diagnosis not present

## 2023-04-09 DIAGNOSIS — L97212 Non-pressure chronic ulcer of right calf with fat layer exposed: Secondary | ICD-10-CM | POA: Diagnosis not present

## 2023-04-09 DIAGNOSIS — E1121 Type 2 diabetes mellitus with diabetic nephropathy: Secondary | ICD-10-CM | POA: Diagnosis not present

## 2023-04-11 DIAGNOSIS — E1121 Type 2 diabetes mellitus with diabetic nephropathy: Secondary | ICD-10-CM | POA: Diagnosis not present

## 2023-04-11 DIAGNOSIS — E7800 Pure hypercholesterolemia, unspecified: Secondary | ICD-10-CM | POA: Diagnosis not present

## 2023-04-11 DIAGNOSIS — L97212 Non-pressure chronic ulcer of right calf with fat layer exposed: Secondary | ICD-10-CM | POA: Diagnosis not present

## 2023-04-11 DIAGNOSIS — I110 Hypertensive heart disease with heart failure: Secondary | ICD-10-CM | POA: Diagnosis not present

## 2023-04-11 DIAGNOSIS — I5033 Acute on chronic diastolic (congestive) heart failure: Secondary | ICD-10-CM | POA: Diagnosis not present

## 2023-04-11 DIAGNOSIS — Z8673 Personal history of transient ischemic attack (TIA), and cerebral infarction without residual deficits: Secondary | ICD-10-CM | POA: Diagnosis not present

## 2023-04-11 DIAGNOSIS — N200 Calculus of kidney: Secondary | ICD-10-CM | POA: Diagnosis not present

## 2023-04-11 DIAGNOSIS — D649 Anemia, unspecified: Secondary | ICD-10-CM | POA: Diagnosis not present

## 2023-04-11 DIAGNOSIS — Z7902 Long term (current) use of antithrombotics/antiplatelets: Secondary | ICD-10-CM | POA: Diagnosis not present

## 2023-04-11 DIAGNOSIS — J9601 Acute respiratory failure with hypoxia: Secondary | ICD-10-CM | POA: Diagnosis not present

## 2023-04-11 DIAGNOSIS — Z7982 Long term (current) use of aspirin: Secondary | ICD-10-CM | POA: Diagnosis not present

## 2023-04-11 DIAGNOSIS — Z89512 Acquired absence of left leg below knee: Secondary | ICD-10-CM | POA: Diagnosis not present

## 2023-04-11 DIAGNOSIS — I872 Venous insufficiency (chronic) (peripheral): Secondary | ICD-10-CM | POA: Diagnosis not present

## 2023-04-11 DIAGNOSIS — Z794 Long term (current) use of insulin: Secondary | ICD-10-CM | POA: Diagnosis not present

## 2023-04-12 DIAGNOSIS — I110 Hypertensive heart disease with heart failure: Secondary | ICD-10-CM | POA: Diagnosis not present

## 2023-04-12 DIAGNOSIS — J9601 Acute respiratory failure with hypoxia: Secondary | ICD-10-CM | POA: Diagnosis not present

## 2023-04-12 DIAGNOSIS — I872 Venous insufficiency (chronic) (peripheral): Secondary | ICD-10-CM | POA: Diagnosis not present

## 2023-04-12 DIAGNOSIS — E1121 Type 2 diabetes mellitus with diabetic nephropathy: Secondary | ICD-10-CM | POA: Diagnosis not present

## 2023-04-12 DIAGNOSIS — L97212 Non-pressure chronic ulcer of right calf with fat layer exposed: Secondary | ICD-10-CM | POA: Diagnosis not present

## 2023-04-12 DIAGNOSIS — I5033 Acute on chronic diastolic (congestive) heart failure: Secondary | ICD-10-CM | POA: Diagnosis not present

## 2023-04-13 DIAGNOSIS — I110 Hypertensive heart disease with heart failure: Secondary | ICD-10-CM | POA: Diagnosis not present

## 2023-04-13 DIAGNOSIS — E1121 Type 2 diabetes mellitus with diabetic nephropathy: Secondary | ICD-10-CM | POA: Diagnosis not present

## 2023-04-13 DIAGNOSIS — L97212 Non-pressure chronic ulcer of right calf with fat layer exposed: Secondary | ICD-10-CM | POA: Diagnosis not present

## 2023-04-13 DIAGNOSIS — I5033 Acute on chronic diastolic (congestive) heart failure: Secondary | ICD-10-CM | POA: Diagnosis not present

## 2023-04-13 DIAGNOSIS — J9601 Acute respiratory failure with hypoxia: Secondary | ICD-10-CM | POA: Diagnosis not present

## 2023-04-13 DIAGNOSIS — I872 Venous insufficiency (chronic) (peripheral): Secondary | ICD-10-CM | POA: Diagnosis not present

## 2023-04-17 DIAGNOSIS — L97212 Non-pressure chronic ulcer of right calf with fat layer exposed: Secondary | ICD-10-CM | POA: Diagnosis not present

## 2023-04-17 DIAGNOSIS — I110 Hypertensive heart disease with heart failure: Secondary | ICD-10-CM | POA: Diagnosis not present

## 2023-04-17 DIAGNOSIS — J9601 Acute respiratory failure with hypoxia: Secondary | ICD-10-CM | POA: Diagnosis not present

## 2023-04-17 DIAGNOSIS — I872 Venous insufficiency (chronic) (peripheral): Secondary | ICD-10-CM | POA: Diagnosis not present

## 2023-04-17 DIAGNOSIS — E1121 Type 2 diabetes mellitus with diabetic nephropathy: Secondary | ICD-10-CM | POA: Diagnosis not present

## 2023-04-17 DIAGNOSIS — I5033 Acute on chronic diastolic (congestive) heart failure: Secondary | ICD-10-CM | POA: Diagnosis not present

## 2023-04-19 DIAGNOSIS — J9601 Acute respiratory failure with hypoxia: Secondary | ICD-10-CM | POA: Diagnosis not present

## 2023-04-19 DIAGNOSIS — I872 Venous insufficiency (chronic) (peripheral): Secondary | ICD-10-CM | POA: Diagnosis not present

## 2023-04-19 DIAGNOSIS — L97212 Non-pressure chronic ulcer of right calf with fat layer exposed: Secondary | ICD-10-CM | POA: Diagnosis not present

## 2023-04-19 DIAGNOSIS — I110 Hypertensive heart disease with heart failure: Secondary | ICD-10-CM | POA: Diagnosis not present

## 2023-04-19 DIAGNOSIS — E1121 Type 2 diabetes mellitus with diabetic nephropathy: Secondary | ICD-10-CM | POA: Diagnosis not present

## 2023-04-19 DIAGNOSIS — I5033 Acute on chronic diastolic (congestive) heart failure: Secondary | ICD-10-CM | POA: Diagnosis not present

## 2023-04-20 DIAGNOSIS — J9601 Acute respiratory failure with hypoxia: Secondary | ICD-10-CM | POA: Diagnosis not present

## 2023-04-20 DIAGNOSIS — I5033 Acute on chronic diastolic (congestive) heart failure: Secondary | ICD-10-CM | POA: Diagnosis not present

## 2023-04-20 DIAGNOSIS — I110 Hypertensive heart disease with heart failure: Secondary | ICD-10-CM | POA: Diagnosis not present

## 2023-04-20 DIAGNOSIS — E1121 Type 2 diabetes mellitus with diabetic nephropathy: Secondary | ICD-10-CM | POA: Diagnosis not present

## 2023-04-20 DIAGNOSIS — L97212 Non-pressure chronic ulcer of right calf with fat layer exposed: Secondary | ICD-10-CM | POA: Diagnosis not present

## 2023-04-20 DIAGNOSIS — I872 Venous insufficiency (chronic) (peripheral): Secondary | ICD-10-CM | POA: Diagnosis not present

## 2023-04-23 ENCOUNTER — Encounter: Payer: Self-pay | Admitting: Orthopedic Surgery

## 2023-04-23 ENCOUNTER — Ambulatory Visit (INDEPENDENT_AMBULATORY_CARE_PROVIDER_SITE_OTHER): Payer: Medicare Other | Admitting: Orthopedic Surgery

## 2023-04-23 DIAGNOSIS — I83019 Varicose veins of right lower extremity with ulcer of unspecified site: Secondary | ICD-10-CM

## 2023-04-23 DIAGNOSIS — L97919 Non-pressure chronic ulcer of unspecified part of right lower leg with unspecified severity: Secondary | ICD-10-CM

## 2023-04-23 DIAGNOSIS — Z89512 Acquired absence of left leg below knee: Secondary | ICD-10-CM

## 2023-04-23 MED ORDER — TRIAMCINOLONE ACETONIDE 0.5 % EX OINT: 1.0000 | TOPICAL_OINTMENT | Freq: Every day | CUTANEOUS | 0 refills | Status: DC

## 2023-04-23 NOTE — Progress Notes (Signed)
Office Visit Note   Patient: [name] [name]           Date of Birth: 1956-09-12           MRN: [id number] Visit Date: 04/23/2023              Requested by: Sasser, Clarene Critchley, MD [address] [place],  [place] [address] PCP: Estanislado Pandy, MD  Chief Complaint  Patient presents with   Right Leg - Follow-up      HPI: Patient is a 67 year old gentleman who is seen in follow-up for venous and lymphatic insufficiency right lower extremity currently has home health nursing dressing changes 2 times a week.  Patient is in motorized wheelchair [place] lift for transfers at home and on nasal cannula FiO2.  Assessment & Plan: Visit Diagnoses:  1. Left below-knee amputee (HCC)   2. Venous ulcer of right leg (HCC)     Plan: Continue the compression wraps.  Patient's wound care nurse requested triamcinolone cream and a prescription was sent to the pharmacy.  Follow-Up Instructions: Return in about 4 weeks (around 05/21/2023).   Ortho Exam  Patient is alert, oriented, no adenopathy, well-dressed, normal affect, normal respiratory effort. Examination there is decreased swelling halfway up the right leg and seems that the compression wrap is rolling down his leg.  There is still swelling in the forefoot.  There are no ulcers on the right lower extremity there is no weeping edema.  The dermatitis is improving.  Imaging: No results found. No images are attached to the encounter.  Labs: Lab Results  Component Value Date   HGBA1C 7.3 (H) 10/17/2021   HGBA1C 6.5 (H) 09/12/2019   CRP 17.6 (H) 09/14/2019   CRP 14.7 (H) 09/13/2019   CRP 14.6 (H) 09/12/2019   REPTSTATUS 11/20/2021 FINAL 11/17/2021   CULT (A) 11/17/2021    >=100,000 COLONIES/mL ESCHERICHIA COLI >=100,000 COLONIES/mL KLEBSIELLA OXYTOCA    LABORGA ESCHERICHIA COLI (A) 11/17/2021   LABORGA KLEBSIELLA OXYTOCA (A) 11/17/2021     Lab Results  Component Value Date   ALBUMIN 2.6 (L) 11/10/2021   ALBUMIN 1.8 (L) 10/25/2021    ALBUMIN 2.5 (L) 10/14/2021    Lab Results  Component Value Date   MG 2.1 09/14/2019   MG 2.3 09/13/2019   MG 1.9 09/12/2019   No results found for: "VD25OH"  No results found for: "PREALBUMIN"    Latest Ref Rng & Units 12/21/2021    9:53 AM 11/10/2021    6:54 AM 10/31/2021    5:00 AM  CBC EXTENDED  WBC 4.0 - 10.5 K/uL 7.6  6.5  7.9   RBC 4.22 - 5.81 Mil/uL 4.57  3.88  3.22   Hemoglobin 13.0 - 17.0 g/dL 21.3  9.5  7.8   HCT 08.6 - 52.0 % 35.6  33.0  26.5   Platelets 150.0 - 400.0 K/uL 230.0  176  243   NEUT# 1.4 - 7.7 K/uL 5.3  4.3    Lymph# 0.7 - 4.0 K/uL 0.9  0.8       There is no height or weight on file to calculate BMI.  Orders:  No orders of the defined types were placed in this encounter.  No orders of the defined types were placed in this encounter.    Procedures: No procedures performed  Clinical Data: No additional findings.  ROS:  All other systems negative, except as noted in the HPI. Review of Systems  Objective: Vital Signs: There  were no vitals taken for this visit.  Specialty Comments:  No specialty comments available.  PMFS History: Patient Active Problem List   Diagnosis Date Noted   Acute on chronic diastolic (congestive) heart failure (HCC)    Controlled type 2 diabetes mellitus with hyperglycemia, without long-term current use of insulin (HCC)    Phantom limb pain (HCC)    Left below-knee amputee (HCC) 10/25/2021   Left leg pain    Osteomyelitis of ankle and foot (HCC) 10/15/2021   Anemia 10/15/2021   Pressure ulcer 10/15/2021   History of CVA (cerebrovascular accident) 10/15/2021   Septic arthritis of ankle and foot region (HCC) 10/15/2021   AKI (acute kidney injury) (HCC) 10/14/2021   Pneumonia due to COVID-19 virus 09/09/2019   Hypokalemia 09/09/2019   Acute respiratory failure with hypoxia (HCC) 09/09/2019   Essential hypertension 10/14/2014   Chronic diastolic heart failure (HCC) 10/14/2014   Morbid obesity (HCC) 07/22/2013    Varicose veins of lower extremities with other complications 07/22/2013   OSA (obstructive sleep apnea) 07/22/2013   Venous stasis ulcers (HCC) 07/22/2013   Diabetes mellitus (HCC) 01/03/2011   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HCC)    Diverticulosis    Essential hypertension    Gastritis    History of nephrolithiasis    History of open leg wound    History of stroke 03/19/2003   Lymphedema    Obstructive sleep apnea    Peptic ulcer    TIA (transient ischemic attack)    Varicose veins    Laser ablation of left great saphenous vein 2009    Family History  Problem Relation Age of Onset   Bone cancer Mother    Hypertension Father    Pancreatic cancer Maternal Grandmother    Stroke Paternal Grandmother    Diabetes Paternal Grandfather    Heart attack Brother    Hypertension Brother    Heart attack Sister     Past Surgical History:  Procedure Laterality Date   AMPUTATION Left 10/21/2021   Procedure: LEFT BELOW KNEE AMPUTATION;  Surgeon: Nadara Mustard, MD;  Location: [facility] OR;  Service: Orthopedics;  Laterality: Left;   BIOPSY  10/19/2021   Procedure: BIOPSY;  Surgeon: Imogene Burn, MD;  Location: [facility] ENDOSCOPY;  Service: Gastroenterology;;   COLONOSCOPY N/A 10/19/2021   Procedure: COLONOSCOPY;  Surgeon: Imogene Burn, MD;  Location: [facility] ENDOSCOPY;  Service: Gastroenterology;  Laterality: N/A;   CYSTOSCOPY WITH RETROGRADE PYELOGRAM, URETEROSCOPY AND STENT PLACEMENT Right 08/02/2015   Procedure: CYSTOSCOPY WITH RETROGRADE PYELOGRAM, URETEROSCOPY , STONE EXTRACTION AND STENT PLACEMENT;  Surgeon: Malen Gauze, MD;  Location: [facility];  Service: Urology;  Laterality: Right;   ESOPHAGOGASTRODUODENOSCOPY (EGD) WITH PROPOFOL N/A 10/19/2021   Procedure: ESOPHAGOGASTRODUODENOSCOPY (EGD) WITH PROPOFOL;  Surgeon: Imogene Burn, MD;  Location[facility] END[facility]COPY;  Service: Gastroenterology;  Laterality: N/A;   HOLMIUM LASER APPLICATION Right 08/02/2015    Procedure: HOLMIUM LASER APPLICATION;  Surgeon: Malen Gauze, MD;  Location: [facility]S;  Service: Urology;  Laterality: Right;   KIDNEY STONE SURGERY     POLYPECTOMY  10/19/2021   Procedure: POLYPECTOMY;  Surgeon: Imogene Burn, MD;  Location: [facility] ENDOSCOPY;  Service: Gastroenterology;;   VASECTOMY  1984   VEIN SURGERY Left 06/2010   Laser ablation   Social History   Occupational History   Occupation: controll room operator     Employer: [employer]  Tobacco Use   Smoking status: Never  Passive exposure: Never   Smokeless tobacco: Never  Vaping Use   Vaping status: Never Used  Substance and Sexual Activity   Alcohol use: No    Alcohol/week: 0.0 standard drinks of alcohol   Drug use: No   Sexual activity: Not on file

## 2023-04-27 DIAGNOSIS — I5033 Acute on chronic diastolic (congestive) heart failure: Secondary | ICD-10-CM | POA: Diagnosis not present

## 2023-04-27 DIAGNOSIS — E1121 Type 2 diabetes mellitus with diabetic nephropathy: Secondary | ICD-10-CM | POA: Diagnosis not present

## 2023-04-27 DIAGNOSIS — J9601 Acute respiratory failure with hypoxia: Secondary | ICD-10-CM | POA: Diagnosis not present

## 2023-04-27 DIAGNOSIS — I110 Hypertensive heart disease with heart failure: Secondary | ICD-10-CM | POA: Diagnosis not present

## 2023-04-27 DIAGNOSIS — L97212 Non-pressure chronic ulcer of right calf with fat layer exposed: Secondary | ICD-10-CM | POA: Diagnosis not present

## 2023-04-27 DIAGNOSIS — I872 Venous insufficiency (chronic) (peripheral): Secondary | ICD-10-CM | POA: Diagnosis not present

## 2023-05-01 DIAGNOSIS — J9601 Acute respiratory failure with hypoxia: Secondary | ICD-10-CM | POA: Diagnosis not present

## 2023-05-01 DIAGNOSIS — I5033 Acute on chronic diastolic (congestive) heart failure: Secondary | ICD-10-CM | POA: Diagnosis not present

## 2023-05-01 DIAGNOSIS — I872 Venous insufficiency (chronic) (peripheral): Secondary | ICD-10-CM | POA: Diagnosis not present

## 2023-05-01 DIAGNOSIS — I110 Hypertensive heart disease with heart failure: Secondary | ICD-10-CM | POA: Diagnosis not present

## 2023-05-01 DIAGNOSIS — L97212 Non-pressure chronic ulcer of right calf with fat layer exposed: Secondary | ICD-10-CM | POA: Diagnosis not present

## 2023-05-01 DIAGNOSIS — E1121 Type 2 diabetes mellitus with diabetic nephropathy: Secondary | ICD-10-CM | POA: Diagnosis not present

## 2023-05-04 DIAGNOSIS — I110 Hypertensive heart disease with heart failure: Secondary | ICD-10-CM | POA: Diagnosis not present

## 2023-05-04 DIAGNOSIS — E7800 Pure hypercholesterolemia, unspecified: Secondary | ICD-10-CM | POA: Diagnosis not present

## 2023-05-04 DIAGNOSIS — I5033 Acute on chronic diastolic (congestive) heart failure: Secondary | ICD-10-CM | POA: Diagnosis not present

## 2023-05-04 DIAGNOSIS — J9601 Acute respiratory failure with hypoxia: Secondary | ICD-10-CM | POA: Diagnosis not present

## 2023-05-04 DIAGNOSIS — L97212 Non-pressure chronic ulcer of right calf with fat layer exposed: Secondary | ICD-10-CM | POA: Diagnosis not present

## 2023-05-04 DIAGNOSIS — E1121 Type 2 diabetes mellitus with diabetic nephropathy: Secondary | ICD-10-CM | POA: Diagnosis not present

## 2023-05-04 DIAGNOSIS — D649 Anemia, unspecified: Secondary | ICD-10-CM | POA: Diagnosis not present

## 2023-05-04 DIAGNOSIS — I872 Venous insufficiency (chronic) (peripheral): Secondary | ICD-10-CM | POA: Diagnosis not present

## 2023-05-07 DIAGNOSIS — J9601 Acute respiratory failure with hypoxia: Secondary | ICD-10-CM | POA: Diagnosis not present

## 2023-05-07 DIAGNOSIS — I110 Hypertensive heart disease with heart failure: Secondary | ICD-10-CM | POA: Diagnosis not present

## 2023-05-07 DIAGNOSIS — I872 Venous insufficiency (chronic) (peripheral): Secondary | ICD-10-CM | POA: Diagnosis not present

## 2023-05-07 DIAGNOSIS — L97212 Non-pressure chronic ulcer of right calf with fat layer exposed: Secondary | ICD-10-CM | POA: Diagnosis not present

## 2023-05-07 DIAGNOSIS — I5033 Acute on chronic diastolic (congestive) heart failure: Secondary | ICD-10-CM | POA: Diagnosis not present

## 2023-05-07 DIAGNOSIS — E1121 Type 2 diabetes mellitus with diabetic nephropathy: Secondary | ICD-10-CM | POA: Diagnosis not present

## 2023-05-10 DIAGNOSIS — I872 Venous insufficiency (chronic) (peripheral): Secondary | ICD-10-CM | POA: Diagnosis not present

## 2023-05-10 DIAGNOSIS — L97212 Non-pressure chronic ulcer of right calf with fat layer exposed: Secondary | ICD-10-CM | POA: Diagnosis not present

## 2023-05-10 DIAGNOSIS — J9601 Acute respiratory failure with hypoxia: Secondary | ICD-10-CM | POA: Diagnosis not present

## 2023-05-10 DIAGNOSIS — I110 Hypertensive heart disease with heart failure: Secondary | ICD-10-CM | POA: Diagnosis not present

## 2023-05-10 DIAGNOSIS — I5033 Acute on chronic diastolic (congestive) heart failure: Secondary | ICD-10-CM | POA: Diagnosis not present

## 2023-05-10 DIAGNOSIS — E1121 Type 2 diabetes mellitus with diabetic nephropathy: Secondary | ICD-10-CM | POA: Diagnosis not present

## 2023-05-11 DIAGNOSIS — Z8673 Personal history of transient ischemic attack (TIA), and cerebral infarction without residual deficits: Secondary | ICD-10-CM | POA: Diagnosis not present

## 2023-05-11 DIAGNOSIS — E1121 Type 2 diabetes mellitus with diabetic nephropathy: Secondary | ICD-10-CM | POA: Diagnosis not present

## 2023-05-11 DIAGNOSIS — I872 Venous insufficiency (chronic) (peripheral): Secondary | ICD-10-CM | POA: Diagnosis not present

## 2023-05-11 DIAGNOSIS — E7800 Pure hypercholesterolemia, unspecified: Secondary | ICD-10-CM | POA: Diagnosis not present

## 2023-05-11 DIAGNOSIS — I5033 Acute on chronic diastolic (congestive) heart failure: Secondary | ICD-10-CM | POA: Diagnosis not present

## 2023-05-11 DIAGNOSIS — D649 Anemia, unspecified: Secondary | ICD-10-CM | POA: Diagnosis not present

## 2023-05-11 DIAGNOSIS — R1312 Dysphagia, oropharyngeal phase: Secondary | ICD-10-CM | POA: Diagnosis not present

## 2023-05-11 DIAGNOSIS — Z89512 Acquired absence of left leg below knee: Secondary | ICD-10-CM | POA: Diagnosis not present

## 2023-05-11 DIAGNOSIS — L97212 Non-pressure chronic ulcer of right calf with fat layer exposed: Secondary | ICD-10-CM | POA: Diagnosis not present

## 2023-05-11 DIAGNOSIS — Z794 Long term (current) use of insulin: Secondary | ICD-10-CM | POA: Diagnosis not present

## 2023-05-11 DIAGNOSIS — J9601 Acute respiratory failure with hypoxia: Secondary | ICD-10-CM | POA: Diagnosis not present

## 2023-05-11 DIAGNOSIS — Z7982 Long term (current) use of aspirin: Secondary | ICD-10-CM | POA: Diagnosis not present

## 2023-05-11 DIAGNOSIS — N200 Calculus of kidney: Secondary | ICD-10-CM | POA: Diagnosis not present

## 2023-05-11 DIAGNOSIS — I110 Hypertensive heart disease with heart failure: Secondary | ICD-10-CM | POA: Diagnosis not present

## 2023-05-11 DIAGNOSIS — Z7902 Long term (current) use of antithrombotics/antiplatelets: Secondary | ICD-10-CM | POA: Diagnosis not present

## 2023-05-14 DIAGNOSIS — I509 Heart failure, unspecified: Secondary | ICD-10-CM | POA: Diagnosis not present

## 2023-05-14 DIAGNOSIS — E1122 Type 2 diabetes mellitus with diabetic chronic kidney disease: Secondary | ICD-10-CM | POA: Diagnosis not present

## 2023-05-14 DIAGNOSIS — E7801 Familial hypercholesterolemia: Secondary | ICD-10-CM | POA: Diagnosis not present

## 2023-05-14 DIAGNOSIS — Z0001 Encounter for general adult medical examination with abnormal findings: Secondary | ICD-10-CM | POA: Diagnosis not present

## 2023-05-14 DIAGNOSIS — I10 Essential (primary) hypertension: Secondary | ICD-10-CM | POA: Diagnosis not present

## 2023-05-14 DIAGNOSIS — G4733 Obstructive sleep apnea (adult) (pediatric): Secondary | ICD-10-CM | POA: Diagnosis not present

## 2023-05-14 DIAGNOSIS — D649 Anemia, unspecified: Secondary | ICD-10-CM | POA: Diagnosis not present

## 2023-05-15 DIAGNOSIS — E1121 Type 2 diabetes mellitus with diabetic nephropathy: Secondary | ICD-10-CM | POA: Diagnosis not present

## 2023-05-15 DIAGNOSIS — J9601 Acute respiratory failure with hypoxia: Secondary | ICD-10-CM | POA: Diagnosis not present

## 2023-05-15 DIAGNOSIS — I110 Hypertensive heart disease with heart failure: Secondary | ICD-10-CM | POA: Diagnosis not present

## 2023-05-15 DIAGNOSIS — L97212 Non-pressure chronic ulcer of right calf with fat layer exposed: Secondary | ICD-10-CM | POA: Diagnosis not present

## 2023-05-15 DIAGNOSIS — I872 Venous insufficiency (chronic) (peripheral): Secondary | ICD-10-CM | POA: Diagnosis not present

## 2023-05-15 DIAGNOSIS — I5033 Acute on chronic diastolic (congestive) heart failure: Secondary | ICD-10-CM | POA: Diagnosis not present

## 2023-05-17 DIAGNOSIS — J9601 Acute respiratory failure with hypoxia: Secondary | ICD-10-CM | POA: Diagnosis not present

## 2023-05-17 DIAGNOSIS — E1121 Type 2 diabetes mellitus with diabetic nephropathy: Secondary | ICD-10-CM | POA: Diagnosis not present

## 2023-05-17 DIAGNOSIS — I110 Hypertensive heart disease with heart failure: Secondary | ICD-10-CM | POA: Diagnosis not present

## 2023-05-17 DIAGNOSIS — I872 Venous insufficiency (chronic) (peripheral): Secondary | ICD-10-CM | POA: Diagnosis not present

## 2023-05-17 DIAGNOSIS — L97212 Non-pressure chronic ulcer of right calf with fat layer exposed: Secondary | ICD-10-CM | POA: Diagnosis not present

## 2023-05-17 DIAGNOSIS — I5033 Acute on chronic diastolic (congestive) heart failure: Secondary | ICD-10-CM | POA: Diagnosis not present

## 2023-05-22 ENCOUNTER — Ambulatory Visit (INDEPENDENT_AMBULATORY_CARE_PROVIDER_SITE_OTHER): Payer: Medicare Other | Admitting: Orthopedic Surgery

## 2023-05-22 DIAGNOSIS — Z89512 Acquired absence of left leg below knee: Secondary | ICD-10-CM

## 2023-05-22 DIAGNOSIS — I83019 Varicose veins of right lower extremity with ulcer of unspecified site: Secondary | ICD-10-CM

## 2023-05-22 DIAGNOSIS — L97919 Non-pressure chronic ulcer of unspecified part of right lower leg with unspecified severity: Secondary | ICD-10-CM | POA: Diagnosis not present

## 2023-05-24 DIAGNOSIS — I10 Essential (primary) hypertension: Secondary | ICD-10-CM | POA: Diagnosis not present

## 2023-05-24 DIAGNOSIS — I5022 Chronic systolic (congestive) heart failure: Secondary | ICD-10-CM | POA: Diagnosis not present

## 2023-05-24 DIAGNOSIS — E785 Hyperlipidemia, unspecified: Secondary | ICD-10-CM | POA: Diagnosis not present

## 2023-05-25 DIAGNOSIS — I872 Venous insufficiency (chronic) (peripheral): Secondary | ICD-10-CM | POA: Diagnosis not present

## 2023-05-25 DIAGNOSIS — I5033 Acute on chronic diastolic (congestive) heart failure: Secondary | ICD-10-CM | POA: Diagnosis not present

## 2023-05-25 DIAGNOSIS — J9601 Acute respiratory failure with hypoxia: Secondary | ICD-10-CM | POA: Diagnosis not present

## 2023-05-25 DIAGNOSIS — I110 Hypertensive heart disease with heart failure: Secondary | ICD-10-CM | POA: Diagnosis not present

## 2023-05-25 DIAGNOSIS — E1121 Type 2 diabetes mellitus with diabetic nephropathy: Secondary | ICD-10-CM | POA: Diagnosis not present

## 2023-05-25 DIAGNOSIS — L97212 Non-pressure chronic ulcer of right calf with fat layer exposed: Secondary | ICD-10-CM | POA: Diagnosis not present

## 2023-05-28 ENCOUNTER — Other Ambulatory Visit: Payer: Self-pay | Admitting: Orthopedic Surgery

## 2023-05-29 ENCOUNTER — Encounter: Payer: Self-pay | Admitting: Orthopedic Surgery

## 2023-05-29 DIAGNOSIS — L97212 Non-pressure chronic ulcer of right calf with fat layer exposed: Secondary | ICD-10-CM | POA: Diagnosis not present

## 2023-05-29 DIAGNOSIS — J9601 Acute respiratory failure with hypoxia: Secondary | ICD-10-CM | POA: Diagnosis not present

## 2023-05-29 DIAGNOSIS — I872 Venous insufficiency (chronic) (peripheral): Secondary | ICD-10-CM | POA: Diagnosis not present

## 2023-05-29 DIAGNOSIS — I5033 Acute on chronic diastolic (congestive) heart failure: Secondary | ICD-10-CM | POA: Diagnosis not present

## 2023-05-29 DIAGNOSIS — E1121 Type 2 diabetes mellitus with diabetic nephropathy: Secondary | ICD-10-CM | POA: Diagnosis not present

## 2023-05-29 DIAGNOSIS — I110 Hypertensive heart disease with heart failure: Secondary | ICD-10-CM | POA: Diagnosis not present

## 2023-05-29 NOTE — Progress Notes (Signed)
Office Visit Note   Patient: [name] [name]           Date of Birth: [birth year]           MRN: [id number] Visit Date: 05/22/2023              Requested by: Sasser, Clarene Critchley, MD [address] [place],  [place] [address] PCP: Estanislado Pandy, MD  Chief Complaint  Patient presents with   Right Leg - Wound Check      HPI: Patient is a 67 year old gentleman who is status post left transtibial amputation February 3 approximately 7 months ago.  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  Patient states that home health nursing is changing compression wraps 2 times a week for the right lower extremity.  Assessment & Plan: Visit Diagnoses:  1. Left below-knee amputee (HCC)   2. Venous ulcer of right leg (HCC)     Plan: Dynaflex wrap applied today continue with serial compression wraps on the right.  Follow-Up Instructions: Return in about 4 weeks (around 06/19/2023).   Ortho Exam  Patient is alert, oriented, no adenopathy, well-dressed, normal affect, normal respiratory effort. Examination patient has decreased swelling of the right lower extremity he still has significant lymphedema in the foot however there are no open ulcers in the foot or leg.  There is no cellulitis or drainage.  Imaging: No results found. No images are attached to the encounter.  Labs: Lab Results  Component Value Date   HGBA1C 7.3 (H) 10/17/2021   HGBA1C 6.5 (H) 09/12/2019   CRP 17.6 (H) 09/14/2019   CRP 14.7 (H) 09/13/2019   CRP 14.6 (H) 09/12/2019   REPTSTATUS 11/20/2021 FINAL 11/17/2021   CULT (A) 11/17/2021    >=100,000 COLONIES/mL ESCHERICHIA COLI >=100,000 COLONIES/mL KLEBSIELLA OXYTOCA    LABORGA ESCHERICHIA COLI (A) 11/17/2021   LABORGA KLEBSIELLA OXYTOCA (A) 11/17/2021     Lab Results  Component Value Date   ALBUMIN 2.6 (L) 11/10/2021   ALBUMIN 1.8 (L) 10/25/2021   ALBUMIN 2.5 (L) 10/14/2021    Lab Results  Component Value  Date   MG 2.1 09/14/2019   MG 2.3 09/13/2019   MG 1.9 09/12/2019   No results found for: "VD25OH"  No results found for: "PREALBUMIN"    Latest Ref Rng & Units 12/21/2021    9:53 AM 11/10/2021    6:54 AM 10/31/2021    5:00 AM  CBC EXTENDED  WBC 4.0 - 10.5 K/uL 7.6  6.5  7.9   RBC 4.22 - 5.81 Mil/uL 4.57  3.88  3.22   Hemoglobin 13.0 - 17.0 g/dL 52.8  9.5  7.8   HCT 41.3 - 52.0 % 35.6  33.0  26.5   Platelets 150.0 - 400.0 K/uL 230.0  176  243   NEUT# 1.4 - 7.7 K/uL 5.3  4.3    Lymph# 0.7 - 4.0 K/uL 0.9  0.8       There is no height or weight on file to calculate BMI.  Orders:  No orders of the defined types were placed in this encounter.  No orders of the defined types were placed in this encounter.    Procedures: No procedures performed  Clinical Data: No additional findings.  ROS:  All other systems negative, except as noted in the HPI. Review of Systems  Objective: Vital Signs: There were no vitals taken for this visit.  Specialty Comments:  No specialty comments available.  PMFS History: Patient Active Problem List   Diagnosis Date Noted   Acute on chronic diastolic (congestive) heart failure (HCC)    Controlled type 2 diabetes mellitus with hyperglycemia, without long-term current use of insulin (HCC)    Phantom limb pain (HCC)    Left below-knee amputee (HCC) 10/25/2021   Left leg pain    Osteomyelitis of ankle and foot (HCC) 10/15/2021   Anemia 10/15/2021   Pressure ulcer 10/15/2021   History of CVA (cerebrovascular accident) 10/15/2021   Septic arthritis of ankle and foot region (HCC) 10/15/2021   AKI (acute kidney injury) (HCC) 10/14/2021   Pneumonia due to COVID-19 virus 09/09/2019   Hypokalemia 09/09/2019   Acute respiratory failure with hypoxia (HCC) 09/09/2019   Essential hypertension 10/14/2014   Chronic diastolic heart failure (HCC) 10/14/2014   Morbid obesity (HCC) 07/22/2013   Varicose veins of lower extremities with other complications  07/22/2013   OSA (obstructive sleep apnea) 07/22/2013   Venous stasis ulcers (HCC) 07/22/2013   Diabetes mellitus (HCC) 01/03/2011   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HCC)    Diverticulosis    Essential hypertension    Gastritis    History of nephrolithiasis    History of open leg wound    History of stroke 03/19/2003   Lymphedema    Obstructive sleep apnea    Peptic ulcer    TIA (transient ischemic attack)    Varicose veins    Laser ablation of left great saphenous vein 2009    Family History  Problem Relation Age of Onset   Bone cancer Mother    Hypertension Father    Pancreatic cancer Maternal Grandmother    Stroke Paternal Grandmother    Diabetes Paternal Grandfather    Heart attack Brother    Hypertension Brother    Heart attack Sister     Past Surgical History:  Procedure Laterality Date   AMPUTATION Left 10/21/2021   Procedure: LEFT BELOW KNEE AMPUTATION;  Surgeon: Nadara Mustard, MD;  Location: [facility];  Service: Orthopedics;  Laterality: Left;   BIOPSY  10/19/2021   Procedure: BIOPSY;  Surgeon: Imogene Burn, MD;  Location: [facility] ENDOSCOPY;  Service: Gastroenterology;;   COLONOSCOPY N/A 10/19/2021   Procedure: COLONOSCOPY;  Surgeon: Imogene Burn, MD;  Location: [facility] ENDOSCOPY;  Service: Gastroenterology;  Laterality: N/A;   CYSTOSCOPY WITH RETROGRADE PYELOGRAM, URETEROSCOPY AND STENT PLACEMENT Right 08/02/2015   Procedure: CYSTOSCOPY WITH RETROGRADE PYELOGRAM, URETEROSCOPY , STONE EXTRACTION AND STENT PLACEMENT;  Surgeon: Malen Gauze, MD;  Location: [facility];  Service: Urology;  Laterality: Right;   ESOPHAGOGASTRODUODENOSCOPY (EGD) WITH PROPOFOL N/A 10/19/2021   Procedure: ESOPHAGOGASTRODUODENOSCOPY (EGD) WITH PROPOFOL;  Surgeon: Imogene Burn, MD;  Location: [facility] ENDOSCOPY;  Service: Gastroenterology;  Laterality: N/A;   HOLMIUM LASER APPLICATION Right 08/02/2015   Procedure: HOLMIUM LASER APPLICATION;  Surgeon: Malen Gauze, MD;  Location: [facility];  Service: Urology;  Laterality: Right;   KIDNEY STONE SURGERY     POLYPECTOMY  10/19/2021   Procedure: POLYPECTOMY;  Surgeon: Imogene Burn, MD;  Location: [facility] ENDOSCOPY;  Service: Gastroenterology;;   Gabriela Eves  1984   VEIN SURGERY Left 06/2010   Laser ablation   Social History   Occupational History   Occupation: controll room operator     Employer: [employer]  Tobacco Use   Smoking status: Never    Passive exposure: Never   Smokeless tobacco: Never  Vaping Use  Vaping status: Never Used  Substance and Sexual Activity   Alcohol use: No    Alcohol/week: 0.0 standard drinks of alcohol   Drug use: No   Sexual activity: Not on file

## 2023-06-01 DIAGNOSIS — L97212 Non-pressure chronic ulcer of right calf with fat layer exposed: Secondary | ICD-10-CM | POA: Diagnosis not present

## 2023-06-01 DIAGNOSIS — Z9981 Dependence on supplemental oxygen: Secondary | ICD-10-CM | POA: Diagnosis not present

## 2023-06-01 DIAGNOSIS — E1121 Type 2 diabetes mellitus with diabetic nephropathy: Secondary | ICD-10-CM | POA: Diagnosis not present

## 2023-06-01 DIAGNOSIS — I872 Venous insufficiency (chronic) (peripheral): Secondary | ICD-10-CM | POA: Diagnosis not present

## 2023-06-01 DIAGNOSIS — J9601 Acute respiratory failure with hypoxia: Secondary | ICD-10-CM | POA: Diagnosis not present

## 2023-06-01 DIAGNOSIS — J9611 Chronic respiratory failure with hypoxia: Secondary | ICD-10-CM | POA: Diagnosis not present

## 2023-06-01 DIAGNOSIS — I10 Essential (primary) hypertension: Secondary | ICD-10-CM | POA: Diagnosis not present

## 2023-06-01 DIAGNOSIS — G4733 Obstructive sleep apnea (adult) (pediatric): Secondary | ICD-10-CM | POA: Diagnosis not present

## 2023-06-01 DIAGNOSIS — I509 Heart failure, unspecified: Secondary | ICD-10-CM | POA: Diagnosis not present

## 2023-06-01 DIAGNOSIS — I110 Hypertensive heart disease with heart failure: Secondary | ICD-10-CM | POA: Diagnosis not present

## 2023-06-01 DIAGNOSIS — J61 Pneumoconiosis due to asbestos and other mineral fibers: Secondary | ICD-10-CM | POA: Diagnosis not present

## 2023-06-01 DIAGNOSIS — I5033 Acute on chronic diastolic (congestive) heart failure: Secondary | ICD-10-CM | POA: Diagnosis not present

## 2023-06-04 DIAGNOSIS — I872 Venous insufficiency (chronic) (peripheral): Secondary | ICD-10-CM | POA: Diagnosis not present

## 2023-06-04 DIAGNOSIS — J9601 Acute respiratory failure with hypoxia: Secondary | ICD-10-CM | POA: Diagnosis not present

## 2023-06-04 DIAGNOSIS — E1121 Type 2 diabetes mellitus with diabetic nephropathy: Secondary | ICD-10-CM | POA: Diagnosis not present

## 2023-06-04 DIAGNOSIS — I5033 Acute on chronic diastolic (congestive) heart failure: Secondary | ICD-10-CM | POA: Diagnosis not present

## 2023-06-04 DIAGNOSIS — I110 Hypertensive heart disease with heart failure: Secondary | ICD-10-CM | POA: Diagnosis not present

## 2023-06-04 DIAGNOSIS — L97212 Non-pressure chronic ulcer of right calf with fat layer exposed: Secondary | ICD-10-CM | POA: Diagnosis not present

## 2023-06-06 DIAGNOSIS — E1121 Type 2 diabetes mellitus with diabetic nephropathy: Secondary | ICD-10-CM | POA: Diagnosis not present

## 2023-06-06 DIAGNOSIS — I110 Hypertensive heart disease with heart failure: Secondary | ICD-10-CM | POA: Diagnosis not present

## 2023-06-06 DIAGNOSIS — I5033 Acute on chronic diastolic (congestive) heart failure: Secondary | ICD-10-CM | POA: Diagnosis not present

## 2023-06-06 DIAGNOSIS — I872 Venous insufficiency (chronic) (peripheral): Secondary | ICD-10-CM | POA: Diagnosis not present

## 2023-06-06 DIAGNOSIS — R1312 Dysphagia, oropharyngeal phase: Secondary | ICD-10-CM | POA: Diagnosis not present

## 2023-06-06 DIAGNOSIS — L97212 Non-pressure chronic ulcer of right calf with fat layer exposed: Secondary | ICD-10-CM | POA: Diagnosis not present

## 2023-06-06 DIAGNOSIS — J9601 Acute respiratory failure with hypoxia: Secondary | ICD-10-CM | POA: Diagnosis not present

## 2023-06-06 DIAGNOSIS — D649 Anemia, unspecified: Secondary | ICD-10-CM | POA: Diagnosis not present

## 2023-06-08 DIAGNOSIS — J9601 Acute respiratory failure with hypoxia: Secondary | ICD-10-CM | POA: Diagnosis not present

## 2023-06-08 DIAGNOSIS — I5033 Acute on chronic diastolic (congestive) heart failure: Secondary | ICD-10-CM | POA: Diagnosis not present

## 2023-06-08 DIAGNOSIS — I872 Venous insufficiency (chronic) (peripheral): Secondary | ICD-10-CM | POA: Diagnosis not present

## 2023-06-08 DIAGNOSIS — I110 Hypertensive heart disease with heart failure: Secondary | ICD-10-CM | POA: Diagnosis not present

## 2023-06-08 DIAGNOSIS — L97212 Non-pressure chronic ulcer of right calf with fat layer exposed: Secondary | ICD-10-CM | POA: Diagnosis not present

## 2023-06-08 DIAGNOSIS — E1121 Type 2 diabetes mellitus with diabetic nephropathy: Secondary | ICD-10-CM | POA: Diagnosis not present

## 2023-06-10 DIAGNOSIS — Z89512 Acquired absence of left leg below knee: Secondary | ICD-10-CM | POA: Diagnosis not present

## 2023-06-10 DIAGNOSIS — Z794 Long term (current) use of insulin: Secondary | ICD-10-CM | POA: Diagnosis not present

## 2023-06-10 DIAGNOSIS — I872 Venous insufficiency (chronic) (peripheral): Secondary | ICD-10-CM | POA: Diagnosis not present

## 2023-06-10 DIAGNOSIS — Z7902 Long term (current) use of antithrombotics/antiplatelets: Secondary | ICD-10-CM | POA: Diagnosis not present

## 2023-06-10 DIAGNOSIS — I5033 Acute on chronic diastolic (congestive) heart failure: Secondary | ICD-10-CM | POA: Diagnosis not present

## 2023-06-10 DIAGNOSIS — I110 Hypertensive heart disease with heart failure: Secondary | ICD-10-CM | POA: Diagnosis not present

## 2023-06-10 DIAGNOSIS — R1312 Dysphagia, oropharyngeal phase: Secondary | ICD-10-CM | POA: Diagnosis not present

## 2023-06-10 DIAGNOSIS — E1121 Type 2 diabetes mellitus with diabetic nephropathy: Secondary | ICD-10-CM | POA: Diagnosis not present

## 2023-06-10 DIAGNOSIS — D649 Anemia, unspecified: Secondary | ICD-10-CM | POA: Diagnosis not present

## 2023-06-10 DIAGNOSIS — E7800 Pure hypercholesterolemia, unspecified: Secondary | ICD-10-CM | POA: Diagnosis not present

## 2023-06-10 DIAGNOSIS — Z8673 Personal history of transient ischemic attack (TIA), and cerebral infarction without residual deficits: Secondary | ICD-10-CM | POA: Diagnosis not present

## 2023-06-10 DIAGNOSIS — N200 Calculus of kidney: Secondary | ICD-10-CM | POA: Diagnosis not present

## 2023-06-10 DIAGNOSIS — J9601 Acute respiratory failure with hypoxia: Secondary | ICD-10-CM | POA: Diagnosis not present

## 2023-06-10 DIAGNOSIS — Z7982 Long term (current) use of aspirin: Secondary | ICD-10-CM | POA: Diagnosis not present

## 2023-06-10 DIAGNOSIS — L97212 Non-pressure chronic ulcer of right calf with fat layer exposed: Secondary | ICD-10-CM | POA: Diagnosis not present

## 2023-06-12 DIAGNOSIS — J9601 Acute respiratory failure with hypoxia: Secondary | ICD-10-CM | POA: Diagnosis not present

## 2023-06-12 DIAGNOSIS — I5033 Acute on chronic diastolic (congestive) heart failure: Secondary | ICD-10-CM | POA: Diagnosis not present

## 2023-06-12 DIAGNOSIS — L97212 Non-pressure chronic ulcer of right calf with fat layer exposed: Secondary | ICD-10-CM | POA: Diagnosis not present

## 2023-06-12 DIAGNOSIS — E1121 Type 2 diabetes mellitus with diabetic nephropathy: Secondary | ICD-10-CM | POA: Diagnosis not present

## 2023-06-12 DIAGNOSIS — I110 Hypertensive heart disease with heart failure: Secondary | ICD-10-CM | POA: Diagnosis not present

## 2023-06-12 DIAGNOSIS — I872 Venous insufficiency (chronic) (peripheral): Secondary | ICD-10-CM | POA: Diagnosis not present

## 2023-06-15 DIAGNOSIS — I110 Hypertensive heart disease with heart failure: Secondary | ICD-10-CM | POA: Diagnosis not present

## 2023-06-15 DIAGNOSIS — L97212 Non-pressure chronic ulcer of right calf with fat layer exposed: Secondary | ICD-10-CM | POA: Diagnosis not present

## 2023-06-15 DIAGNOSIS — I872 Venous insufficiency (chronic) (peripheral): Secondary | ICD-10-CM | POA: Diagnosis not present

## 2023-06-15 DIAGNOSIS — J9601 Acute respiratory failure with hypoxia: Secondary | ICD-10-CM | POA: Diagnosis not present

## 2023-06-15 DIAGNOSIS — E1121 Type 2 diabetes mellitus with diabetic nephropathy: Secondary | ICD-10-CM | POA: Diagnosis not present

## 2023-06-15 DIAGNOSIS — I5033 Acute on chronic diastolic (congestive) heart failure: Secondary | ICD-10-CM | POA: Diagnosis not present

## 2023-06-19 ENCOUNTER — Ambulatory Visit (INDEPENDENT_AMBULATORY_CARE_PROVIDER_SITE_OTHER): Payer: Medicare Other | Admitting: Orthopedic Surgery

## 2023-06-19 ENCOUNTER — Encounter: Payer: Self-pay | Admitting: Orthopedic Surgery

## 2023-06-19 DIAGNOSIS — L97919 Non-pressure chronic ulcer of unspecified part of right lower leg with unspecified severity: Secondary | ICD-10-CM

## 2023-06-19 DIAGNOSIS — Z89512 Acquired absence of left leg below knee: Secondary | ICD-10-CM | POA: Diagnosis not present

## 2023-06-19 DIAGNOSIS — I83019 Varicose veins of right lower extremity with ulcer of unspecified site: Secondary | ICD-10-CM

## 2023-06-19 NOTE — Progress Notes (Signed)
Office Visit Note   Patient: [name] [name]           Date of Birth: Jan 26, 1956           MRN: [id number] Visit Date: 06/19/2023              Requested by: Sasser, Clarene Critchley, MD [address] [place],  [place] [address] PCP: Estanislado Pandy, MD  Chief Complaint  Patient presents with   Right Leg - Follow-up      HPI: Patient is a 66 year old gentleman who is seen in follow-up for left below-knee amputation and right leg venous and lymphatic insufficiency currently undergoing home health nursing wraps 2 times a week.  Assessment & Plan: Visit Diagnoses:  1. Left below-knee amputee (HCC)   2. Venous ulcer of right leg (HCC)     Plan: Recommended continue with wound care.  We will apply Vive socks between the toes to help with the maceration.  Recommended home health nursing proceed with gauze wrap between the toes with dressing changes.  Follow-Up Instructions: Return in about 4 weeks (around 07/17/2023).   Ortho Exam  Patient is alert, oriented, no adenopathy, well-dressed, normal affect, normal respiratory effort. Examination patient is in motorized wheelchair with nasal cannula FiO2.  Left below-knee amputation is stable with a shrinker in place.  Examination the right leg he has decreased dermatitis decreased swelling there is maceration between the toes no open ulcers.  Imaging: No results found. No images are attached to the encounter.  Labs: Lab Results  Component Value Date   HGBA1C 7.3 (H) 10/17/2021   HGBA1C 6.5 (H) 09/12/2019   CRP 17.6 (H) 09/14/2019   CRP 14.7 (H) 09/13/2019   CRP 14.6 (H) 09/12/2019   REPTSTATUS 11/20/2021 FINAL 11/17/2021   CULT (A) 11/17/2021    >=100,000 COLONIES/mL ESCHERICHIA COLI >=100,000 COLONIES/mL KLEBSIELLA OXYTOCA    LABORGA ESCHERICHIA COLI (A) 11/17/2021   LABORGA KLEBSIELLA OXYTOCA (A) 11/17/2021     Lab Results  Component Value Date   ALBUMIN 2.6 (L) 11/10/2021   ALBUMIN 1.8 (L) 10/25/2021   ALBUMIN 2.5 (L)  10/14/2021    Lab Results  Component Value Date   MG 2.1 09/14/2019   MG 2.3 09/13/2019   MG 1.9 09/12/2019   No results found for: "VD25OH"  No results found for: "PREALBUMIN"    Latest Ref Rng & Units 12/21/2021    9:53 AM 11/10/2021    6:54 AM 10/31/2021    5:00 AM  CBC EXTENDED  WBC 4.0 - 10.5 K/uL 7.6  6.5  7.9   RBC 4.22 - 5.81 Mil/uL 4.57  3.88  3.22   Hemoglobin 13.0 - 17.0 g/dL 19.1  9.5  7.8   HCT 47.8 - 52.0 % 35.6  33.0  26.5   Platelets 150.0 - 400.0 K/uL 230.0  176  243   NEUT# 1.4 - 7.7 K/uL 5.3  4.3    Lymph# 0.7 - 4.0 K/uL 0.9  0.8       There is no height or weight on file to calculate BMI.  Orders:  No orders of the defined types were placed in this encounter.  No orders of the defined types were placed in this encounter.    Procedures: No procedures performed  Clinical Data: No additional findings.  ROS:  All other systems negative, except as noted in the HPI. Review of Systems  Objective: Vital Signs: There were no vitals taken for this visit.  Specialty  Comments:  No specialty comments available.  PMFS History: Patient Active Problem List   Diagnosis Date Noted   Acute on chronic diastolic (congestive) heart failure (HCC)    Controlled type 2 diabetes mellitus with hyperglycemia, without long-term current use of insulin (HCC)    Phantom limb pain (HCC)    Left below-knee amputee (HCC) 10/25/2021   Left leg pain    Osteomyelitis of ankle and foot (HCC) 10/15/2021   Anemia 10/15/2021   Pressure ulcer 10/15/2021   History of CVA (cerebrovascular accident) 10/15/2021   Septic arthritis of ankle and foot region (HCC) 10/15/2021   AKI (acute kidney injury) (HCC) 10/14/2021   Pneumonia due to COVID-19 virus 09/09/2019   Hypokalemia 09/09/2019   Acute respiratory failure with hypoxia (HCC) 09/09/2019   Essential hypertension 10/14/2014   Chronic diastolic heart failure (HCC) 10/14/2014   Morbid obesity (HCC) 07/22/2013   Varicose  veins of lower extremities with other complications 07/22/2013   OSA (obstructive sleep apnea) 07/22/2013   Venous stasis ulcers (HCC) 07/22/2013   Diabetes mellitus (HCC) 01/03/2011   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HCC)    Diverticulosis    Essential hypertension    Gastritis    History of nephrolithiasis    History of open leg wound    History of stroke 03/19/2003   Lymphedema    Obstructive sleep apnea    Peptic ulcer    TIA (transient ischemic attack)    Varicose veins    Laser ablation of left great saphenous vein 2009    Family History  Problem Relation Age of Onset   Bone cancer Mother    Hypertension Father    Pancreatic cancer Maternal Grandmother    Stroke Paternal Grandmother    Diabetes Paternal Grandfather    Heart attack Brother    Hypertension Brother    Heart attack Sister     Past Surgical History:  Procedure Laterality Date   AMPUTATION Left 10/21/2021   Procedure: LEFT BELOW KNEE AMPUTATION;  Surgeon: Nadara Mustard, MD;  Location: [facility];  Service: Orthopedics;  Laterality: Left;   BIOPSY  10/19/2021   Procedure: BIOPSY;  Surgeon: Imogene Burn, MD;  Location: [facility] ENDOSCOPY;  Service: Gastroenterology;;   COLONOSCOPY N/A 10/19/2021   Procedure: COLONOSCOPY;  Surgeon: Imogene Burn, MD;  Location: [facility] ENDOSCOPY;  Service: Gastroenterology;  Laterality: N/A;   CYSTOSCOPY WITH RETROGRADE PYELOGRAM, URETEROSCOPY AND STENT PLACEMENT Right 08/02/2015   Procedure: CYSTOSCOPY WITH RETROGRADE PYELOGRAM, URETEROSCOPY , STONE EXTRACTION AND STENT PLACEMENT;  Surgeon: Malen Gauze, MD;  Location: [facility];  Service: Urology;  Laterality: Right;   ESOPHAGOGASTRODUODENOSCOPY (EGD) WITH PROPOFOL N/A 10/19/2021   Procedure: ESOPHAGOGASTRODUODENOSCOPY (EGD) WITH PROPOFOL;  Surgeon: Imogene Burn, MD;  Location: [facility] ENDOSCOPY;  Service: Gastroenterology;  Laterality: N/A;   HOLMIUM LASER APPLICATION Right 08/02/2015    Procedure: HOLMIUM LASER APPLICATION;  Surgeon: Malen Gauze, MD;  Location: [facility];  Service: Urology;  Laterality: Right;   KIDNEY STONE SURGERY     POLYPECTOMY  10/19/2021   Procedure: POLYPECTOMY;  Surgeon: Imogene Burn, MD;  Location: [facility] ENDOSCOPY;  Service: Gastroenterology;;   VASECTOMY  1984   VEIN SURGERY Left 06/2010   Laser ablation   Social History   Occupational History   Occupation: controll room operator     Employer: [employer]  Tobacco Use   Smoking status: Never    Passive exposure: Never   Smokeless  tobacco: Never  Vaping Use   Vaping status: Never Used  Substance and Sexual Activity   Alcohol use: No    Alcohol/week: 0.0 standard drinks of alcohol   Drug use: No   Sexual activity: Not on file

## 2023-06-22 DIAGNOSIS — I110 Hypertensive heart disease with heart failure: Secondary | ICD-10-CM | POA: Diagnosis not present

## 2023-06-22 DIAGNOSIS — I872 Venous insufficiency (chronic) (peripheral): Secondary | ICD-10-CM | POA: Diagnosis not present

## 2023-06-22 DIAGNOSIS — L97212 Non-pressure chronic ulcer of right calf with fat layer exposed: Secondary | ICD-10-CM | POA: Diagnosis not present

## 2023-06-22 DIAGNOSIS — E1121 Type 2 diabetes mellitus with diabetic nephropathy: Secondary | ICD-10-CM | POA: Diagnosis not present

## 2023-06-22 DIAGNOSIS — J9601 Acute respiratory failure with hypoxia: Secondary | ICD-10-CM | POA: Diagnosis not present

## 2023-06-22 DIAGNOSIS — I5033 Acute on chronic diastolic (congestive) heart failure: Secondary | ICD-10-CM | POA: Diagnosis not present

## 2023-06-26 DIAGNOSIS — I872 Venous insufficiency (chronic) (peripheral): Secondary | ICD-10-CM | POA: Diagnosis not present

## 2023-06-26 DIAGNOSIS — E1121 Type 2 diabetes mellitus with diabetic nephropathy: Secondary | ICD-10-CM | POA: Diagnosis not present

## 2023-06-26 DIAGNOSIS — J9601 Acute respiratory failure with hypoxia: Secondary | ICD-10-CM | POA: Diagnosis not present

## 2023-06-26 DIAGNOSIS — L97212 Non-pressure chronic ulcer of right calf with fat layer exposed: Secondary | ICD-10-CM | POA: Diagnosis not present

## 2023-06-26 DIAGNOSIS — I110 Hypertensive heart disease with heart failure: Secondary | ICD-10-CM | POA: Diagnosis not present

## 2023-06-26 DIAGNOSIS — I5033 Acute on chronic diastolic (congestive) heart failure: Secondary | ICD-10-CM | POA: Diagnosis not present

## 2023-06-29 DIAGNOSIS — I110 Hypertensive heart disease with heart failure: Secondary | ICD-10-CM | POA: Diagnosis not present

## 2023-06-29 DIAGNOSIS — E1121 Type 2 diabetes mellitus with diabetic nephropathy: Secondary | ICD-10-CM | POA: Diagnosis not present

## 2023-06-29 DIAGNOSIS — I5033 Acute on chronic diastolic (congestive) heart failure: Secondary | ICD-10-CM | POA: Diagnosis not present

## 2023-06-29 DIAGNOSIS — I872 Venous insufficiency (chronic) (peripheral): Secondary | ICD-10-CM | POA: Diagnosis not present

## 2023-06-29 DIAGNOSIS — L97212 Non-pressure chronic ulcer of right calf with fat layer exposed: Secondary | ICD-10-CM | POA: Diagnosis not present

## 2023-06-29 DIAGNOSIS — J9601 Acute respiratory failure with hypoxia: Secondary | ICD-10-CM | POA: Diagnosis not present

## 2023-07-03 DIAGNOSIS — I110 Hypertensive heart disease with heart failure: Secondary | ICD-10-CM | POA: Diagnosis not present

## 2023-07-03 DIAGNOSIS — I5033 Acute on chronic diastolic (congestive) heart failure: Secondary | ICD-10-CM | POA: Diagnosis not present

## 2023-07-03 DIAGNOSIS — E1121 Type 2 diabetes mellitus with diabetic nephropathy: Secondary | ICD-10-CM | POA: Diagnosis not present

## 2023-07-03 DIAGNOSIS — J9601 Acute respiratory failure with hypoxia: Secondary | ICD-10-CM | POA: Diagnosis not present

## 2023-07-03 DIAGNOSIS — I872 Venous insufficiency (chronic) (peripheral): Secondary | ICD-10-CM | POA: Diagnosis not present

## 2023-07-03 DIAGNOSIS — L97212 Non-pressure chronic ulcer of right calf with fat layer exposed: Secondary | ICD-10-CM | POA: Diagnosis not present

## 2023-07-06 DIAGNOSIS — I5033 Acute on chronic diastolic (congestive) heart failure: Secondary | ICD-10-CM | POA: Diagnosis not present

## 2023-07-06 DIAGNOSIS — I872 Venous insufficiency (chronic) (peripheral): Secondary | ICD-10-CM | POA: Diagnosis not present

## 2023-07-06 DIAGNOSIS — I110 Hypertensive heart disease with heart failure: Secondary | ICD-10-CM | POA: Diagnosis not present

## 2023-07-06 DIAGNOSIS — J9601 Acute respiratory failure with hypoxia: Secondary | ICD-10-CM | POA: Diagnosis not present

## 2023-07-06 DIAGNOSIS — E1121 Type 2 diabetes mellitus with diabetic nephropathy: Secondary | ICD-10-CM | POA: Diagnosis not present

## 2023-07-06 DIAGNOSIS — L97212 Non-pressure chronic ulcer of right calf with fat layer exposed: Secondary | ICD-10-CM | POA: Diagnosis not present

## 2023-07-10 ENCOUNTER — Ambulatory Visit: Payer: Medicare Other | Admitting: Family

## 2023-07-10 DIAGNOSIS — J9601 Acute respiratory failure with hypoxia: Secondary | ICD-10-CM | POA: Diagnosis not present

## 2023-07-10 DIAGNOSIS — Z8673 Personal history of transient ischemic attack (TIA), and cerebral infarction without residual deficits: Secondary | ICD-10-CM | POA: Diagnosis not present

## 2023-07-10 DIAGNOSIS — D649 Anemia, unspecified: Secondary | ICD-10-CM | POA: Diagnosis not present

## 2023-07-10 DIAGNOSIS — I5033 Acute on chronic diastolic (congestive) heart failure: Secondary | ICD-10-CM | POA: Diagnosis not present

## 2023-07-10 DIAGNOSIS — Z7982 Long term (current) use of aspirin: Secondary | ICD-10-CM | POA: Diagnosis not present

## 2023-07-10 DIAGNOSIS — I110 Hypertensive heart disease with heart failure: Secondary | ICD-10-CM | POA: Diagnosis not present

## 2023-07-10 DIAGNOSIS — Z7902 Long term (current) use of antithrombotics/antiplatelets: Secondary | ICD-10-CM | POA: Diagnosis not present

## 2023-07-10 DIAGNOSIS — I872 Venous insufficiency (chronic) (peripheral): Secondary | ICD-10-CM | POA: Diagnosis not present

## 2023-07-10 DIAGNOSIS — L97212 Non-pressure chronic ulcer of right calf with fat layer exposed: Secondary | ICD-10-CM | POA: Diagnosis not present

## 2023-07-10 DIAGNOSIS — E1121 Type 2 diabetes mellitus with diabetic nephropathy: Secondary | ICD-10-CM | POA: Diagnosis not present

## 2023-07-10 DIAGNOSIS — E7800 Pure hypercholesterolemia, unspecified: Secondary | ICD-10-CM | POA: Diagnosis not present

## 2023-07-10 DIAGNOSIS — R1312 Dysphagia, oropharyngeal phase: Secondary | ICD-10-CM | POA: Diagnosis not present

## 2023-07-10 DIAGNOSIS — N200 Calculus of kidney: Secondary | ICD-10-CM | POA: Diagnosis not present

## 2023-07-10 DIAGNOSIS — Z9981 Dependence on supplemental oxygen: Secondary | ICD-10-CM | POA: Diagnosis not present

## 2023-07-10 DIAGNOSIS — Z89512 Acquired absence of left leg below knee: Secondary | ICD-10-CM | POA: Diagnosis not present

## 2023-07-13 DIAGNOSIS — I5033 Acute on chronic diastolic (congestive) heart failure: Secondary | ICD-10-CM | POA: Diagnosis not present

## 2023-07-13 DIAGNOSIS — J9601 Acute respiratory failure with hypoxia: Secondary | ICD-10-CM | POA: Diagnosis not present

## 2023-07-13 DIAGNOSIS — I110 Hypertensive heart disease with heart failure: Secondary | ICD-10-CM | POA: Diagnosis not present

## 2023-07-13 DIAGNOSIS — L97212 Non-pressure chronic ulcer of right calf with fat layer exposed: Secondary | ICD-10-CM | POA: Diagnosis not present

## 2023-07-13 DIAGNOSIS — I872 Venous insufficiency (chronic) (peripheral): Secondary | ICD-10-CM | POA: Diagnosis not present

## 2023-07-13 DIAGNOSIS — E1121 Type 2 diabetes mellitus with diabetic nephropathy: Secondary | ICD-10-CM | POA: Diagnosis not present

## 2023-07-17 ENCOUNTER — Ambulatory Visit (INDEPENDENT_AMBULATORY_CARE_PROVIDER_SITE_OTHER): Payer: Medicare Other | Admitting: Orthopedic Surgery

## 2023-07-17 ENCOUNTER — Encounter: Payer: Self-pay | Admitting: Orthopedic Surgery

## 2023-07-17 DIAGNOSIS — Z89512 Acquired absence of left leg below knee: Secondary | ICD-10-CM

## 2023-07-17 DIAGNOSIS — I83019 Varicose veins of right lower extremity with ulcer of unspecified site: Secondary | ICD-10-CM

## 2023-07-17 DIAGNOSIS — L97919 Non-pressure chronic ulcer of unspecified part of right lower leg with unspecified severity: Secondary | ICD-10-CM

## 2023-07-17 MED ORDER — TRIAMCINOLONE ACETONIDE 0.1 % EX OINT: 1.0000 | TOPICAL_OINTMENT | Freq: Two times a day (BID) | CUTANEOUS | 3 refills | Status: AC

## 2023-07-17 NOTE — Progress Notes (Signed)
Office Visit Note   Patient: [name] [name]           Date of Birth: [birth year]           MRN: [id number] Visit Date: 07/17/2023              Requested by: Sasser, Clarene Critchley, MD [address] [place],  [place] [address] PCP: Estanislado Pandy, MD  Chief Complaint  Patient presents with   Right Leg - Wound Check      HPI: Patient is a 67 year old gentleman who is seen in follow-up for venous and lymphatic insufficiency ulceration right lower extremity.  Currently undergoing home health dressing changes twice a week.  Patient request refill for his triamcinolone ointment.  Assessment & Plan: Visit Diagnoses:  1. Left below-knee amputee (HCC)   2. Venous ulcer of right leg (HCC)     Plan: Refill placed for triamcinolone ointment 1 jar at 0.1%.  3 layer compression wrap applied to the right lower extremity.  Recommended continuing daily gauze dressing changes between the toes.  Follow-Up Instructions: Return in about 4 weeks (around 08/14/2023).   Ortho Exam  Patient is alert, oriented, no adenopathy, well-dressed, normal affect, normal respiratory effort. Examination patient still has swelling in the forefoot with massive swelling between the toes.  There are no open ulcers in the leg or foot.  Dynaflex compression wrap reapplied.  Imaging: No results found. No images are attached to the encounter.  Labs: Lab Results  Component Value Date   HGBA1C 7.3 (H) 10/17/2021   HGBA1C 6.5 (H) 09/12/2019   CRP 17.6 (H) 09/14/2019   CRP 14.7 (H) 09/13/2019   CRP 14.6 (H) 09/12/2019   REPTSTATUS 11/20/2021 FINAL 11/17/2021   CULT (A) 11/17/2021    >=100,000 COLONIES/mL ESCHERICHIA COLI >=100,000 COLONIES/mL KLEBSIELLA OXYTOCA    LABORGA ESCHERICHIA COLI (A) 11/17/2021   LABORGA KLEBSIELLA OXYTOCA (A) 11/17/2021     Lab Results  Component Value Date   ALBUMIN 2.6 (L) 11/10/2021   ALBUMIN 1.8 (L) 10/25/2021   ALBUMIN 2.5 (L) 10/14/2021    Lab Results  Component Value  Date   MG 2.1 09/14/2019   MG 2.3 09/13/2019   MG 1.9 09/12/2019   No results found for: "VD25OH"  No results found for: "PREALBUMIN"    Latest Ref Rng & Units 12/21/2021    9:53 AM 11/10/2021    6:54 AM 10/31/2021    5:00 AM  CBC EXTENDED  WBC 4.0 - 10.5 K/uL 7.6  6.5  7.9   RBC 4.22 - 5.81 Mil/uL 4.57  3.88  3.22   Hemoglobin 13.0 - 17.0 g/dL 62.1  9.5  7.8   HCT 30.8 - 52.0 % 35.6  33.0  26.5   Platelets 150.0 - 400.0 K/uL 230.0  176  243   NEUT# 1.4 - 7.7 K/uL 5.3  4.3    Lymph# 0.7 - 4.0 K/uL 0.9  0.8       There is no height or weight on file to calculate BMI.  Orders:  No orders of the defined types were placed in this encounter.  Meds ordered this encounter  Medications   triamcinolone ointment (KENALOG) 0.1 %    Sig: Apply 1 Application topically 2 (two) times daily.    Dispense:  454 g    Refill:  3     Procedures: No procedures performed  Clinical Data: No additional findings.  ROS:  All other systems negative, except as noted  in the HPI. Review of Systems  Objective: Vital Signs: There were no vitals taken for this visit.  Specialty Comments:  No specialty comments available.  PMFS History: Patient Active Problem List   Diagnosis Date Noted   Acute on chronic diastolic (congestive) heart failure (HCC)    Controlled type 2 diabetes mellitus with hyperglycemia, without long-term current use of insulin (HCC)    Phantom limb pain (HCC)    Left below-knee amputee (HCC) 10/25/2021   Left leg pain    Osteomyelitis of ankle and foot (HCC) 10/15/2021   Anemia 10/15/2021   Pressure ulcer 10/15/2021   History of CVA (cerebrovascular accident) 10/15/2021   Septic arthritis of ankle and foot region (HCC) 10/15/2021   AKI (acute kidney injury) (HCC) 10/14/2021   Pneumonia due to COVID-19 virus 09/09/2019   Hypokalemia 09/09/2019   Acute respiratory failure with hypoxia (HCC) 09/09/2019   Essential hypertension 10/14/2014   Chronic diastolic heart  failure (HCC) 08/65/7846   Morbid obesity (HCC) 07/22/2013   Varicose veins of bilateral lower extremities with other complications 07/22/2013   OSA (obstructive sleep apnea) 07/22/2013   Venous stasis ulcers (HCC) 07/22/2013   Diabetes mellitus (HCC) 01/03/2011   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HCC)    Diverticulosis    Essential hypertension    Gastritis    History of nephrolithiasis    History of open leg wound    History of stroke 03/19/2003   Lymphedema    Obstructive sleep apnea    Peptic ulcer    TIA (transient ischemic attack)    Varicose veins    Laser ablation of left great saphenous vein 2009    Family History  Problem Relation Age of Onset   Bone cancer Mother    Hypertension Father    Pancreatic cancer Maternal Grandmother    Stroke Paternal Grandmother    Diabetes Paternal Grandfather    Heart attack Brother    Hypertension Brother    Heart attack Sister     Past Surgical History:  Procedure Laterality Date   AMPUTATION Left 10/21/2021   Procedure: LEFT BELOW KNEE AMPUTATION;  Surgeon: Nadara Mustard, MD;  Location: [facility] OR;  Service: Orthopedics;  Laterality: Left;   BIOPSY  10/19/2021   Procedure: BIOPSY;  Surgeon: Imogene Burn, MD;  Location: [facility] ENDOSCOPY;  Service: Gastroenterology;;   COLONOSCOPY N/A 10/19/2021   Procedure: COLONOSCOPY;  Surgeon: Imogene Burn, MD;  Location: [facility] ENDOSCOPY;  Service: Gastroenterology;  Laterality: N/A;   CYSTOSCOPY WITH RETROGRADE PYELOGRAM, URETEROSCOPY AND STENT PLACEMENT Right 08/02/2015   Procedure: CYSTOSCOPY WITH RETROGRADE PYELOGRAM, URETEROSCOPY , STONE EXTRACTION AND STENT PLACEMENT;  Surgeon: Malen Gauze, MD;  Location: [facility];  Service: Urology;  Laterality: Right;   ESOPHAGOGASTRODUODENOSCOPY (EGD) WITH PROPOFOL N/A 10/19/2021   Procedure: ESOPHAGOGASTRODUODENOSCOPY (EGD) WITH PROPOFOL;  Surgeon: Imogene Burn, MD;  Location: [facility] ENDOSCOPY;  Service:  Gastroenterology;  Laterality: N/A;   HOLMIUM LASER APPLICATION Right 08/02/2015   Procedure: HOLMIUM LASER APPLICATION;  Surgeon: Malen Gauze, MD;  Location: [facility];  Service: Urology;  Laterality: Right;   KIDNEY STONE SURGERY     POLYPECTOMY  10/19/2021   Procedure: POLYPECTOMY;  Surgeon: Imogene Burn, MD;  Location: [facility] ENDOSCOPY;  Service: Gastroenterology;;   VASECTOMY  1984   VEIN SURGERY Left 06/2010   Laser ablation   Social History   Occupational History   Occupation: controll room operator  Employer: [employer]  Tobacco Use   Smoking status: Never    Passive exposure: Never   Smokeless tobacco: Never  Vaping Use   Vaping status: Never Used  Substance and Sexual Activity   Alcohol use: No    Alcohol/week: 0.0 standard drinks of alcohol   Drug use: No   Sexual activity: Not on file

## 2023-07-18 ENCOUNTER — Ambulatory Visit: Payer: Medicare Other | Admitting: Family

## 2023-07-20 DIAGNOSIS — E1121 Type 2 diabetes mellitus with diabetic nephropathy: Secondary | ICD-10-CM | POA: Diagnosis not present

## 2023-07-20 DIAGNOSIS — I872 Venous insufficiency (chronic) (peripheral): Secondary | ICD-10-CM | POA: Diagnosis not present

## 2023-07-20 DIAGNOSIS — I5033 Acute on chronic diastolic (congestive) heart failure: Secondary | ICD-10-CM | POA: Diagnosis not present

## 2023-07-20 DIAGNOSIS — I110 Hypertensive heart disease with heart failure: Secondary | ICD-10-CM | POA: Diagnosis not present

## 2023-07-20 DIAGNOSIS — J9601 Acute respiratory failure with hypoxia: Secondary | ICD-10-CM | POA: Diagnosis not present

## 2023-07-20 DIAGNOSIS — L97212 Non-pressure chronic ulcer of right calf with fat layer exposed: Secondary | ICD-10-CM | POA: Diagnosis not present

## 2023-07-25 DIAGNOSIS — L97212 Non-pressure chronic ulcer of right calf with fat layer exposed: Secondary | ICD-10-CM | POA: Diagnosis not present

## 2023-07-25 DIAGNOSIS — E1121 Type 2 diabetes mellitus with diabetic nephropathy: Secondary | ICD-10-CM | POA: Diagnosis not present

## 2023-07-25 DIAGNOSIS — I872 Venous insufficiency (chronic) (peripheral): Secondary | ICD-10-CM | POA: Diagnosis not present

## 2023-07-25 DIAGNOSIS — J9601 Acute respiratory failure with hypoxia: Secondary | ICD-10-CM | POA: Diagnosis not present

## 2023-07-25 DIAGNOSIS — I5033 Acute on chronic diastolic (congestive) heart failure: Secondary | ICD-10-CM | POA: Diagnosis not present

## 2023-07-25 DIAGNOSIS — I110 Hypertensive heart disease with heart failure: Secondary | ICD-10-CM | POA: Diagnosis not present

## 2023-07-27 DIAGNOSIS — I872 Venous insufficiency (chronic) (peripheral): Secondary | ICD-10-CM | POA: Diagnosis not present

## 2023-07-27 DIAGNOSIS — I110 Hypertensive heart disease with heart failure: Secondary | ICD-10-CM | POA: Diagnosis not present

## 2023-07-27 DIAGNOSIS — L97212 Non-pressure chronic ulcer of right calf with fat layer exposed: Secondary | ICD-10-CM | POA: Diagnosis not present

## 2023-07-27 DIAGNOSIS — E1121 Type 2 diabetes mellitus with diabetic nephropathy: Secondary | ICD-10-CM | POA: Diagnosis not present

## 2023-07-27 DIAGNOSIS — I5033 Acute on chronic diastolic (congestive) heart failure: Secondary | ICD-10-CM | POA: Diagnosis not present

## 2023-07-27 DIAGNOSIS — J9601 Acute respiratory failure with hypoxia: Secondary | ICD-10-CM | POA: Diagnosis not present

## 2023-07-31 DIAGNOSIS — I872 Venous insufficiency (chronic) (peripheral): Secondary | ICD-10-CM | POA: Diagnosis not present

## 2023-07-31 DIAGNOSIS — I5033 Acute on chronic diastolic (congestive) heart failure: Secondary | ICD-10-CM | POA: Diagnosis not present

## 2023-07-31 DIAGNOSIS — I110 Hypertensive heart disease with heart failure: Secondary | ICD-10-CM | POA: Diagnosis not present

## 2023-07-31 DIAGNOSIS — J9601 Acute respiratory failure with hypoxia: Secondary | ICD-10-CM | POA: Diagnosis not present

## 2023-07-31 DIAGNOSIS — R1312 Dysphagia, oropharyngeal phase: Secondary | ICD-10-CM | POA: Diagnosis not present

## 2023-07-31 DIAGNOSIS — L97212 Non-pressure chronic ulcer of right calf with fat layer exposed: Secondary | ICD-10-CM | POA: Diagnosis not present

## 2023-07-31 DIAGNOSIS — E1121 Type 2 diabetes mellitus with diabetic nephropathy: Secondary | ICD-10-CM | POA: Diagnosis not present

## 2023-07-31 DIAGNOSIS — D649 Anemia, unspecified: Secondary | ICD-10-CM | POA: Diagnosis not present

## 2023-08-03 DIAGNOSIS — E1121 Type 2 diabetes mellitus with diabetic nephropathy: Secondary | ICD-10-CM | POA: Diagnosis not present

## 2023-08-03 DIAGNOSIS — L97212 Non-pressure chronic ulcer of right calf with fat layer exposed: Secondary | ICD-10-CM | POA: Diagnosis not present

## 2023-08-03 DIAGNOSIS — I110 Hypertensive heart disease with heart failure: Secondary | ICD-10-CM | POA: Diagnosis not present

## 2023-08-03 DIAGNOSIS — J9601 Acute respiratory failure with hypoxia: Secondary | ICD-10-CM | POA: Diagnosis not present

## 2023-08-03 DIAGNOSIS — I872 Venous insufficiency (chronic) (peripheral): Secondary | ICD-10-CM | POA: Diagnosis not present

## 2023-08-03 DIAGNOSIS — I5033 Acute on chronic diastolic (congestive) heart failure: Secondary | ICD-10-CM | POA: Diagnosis not present

## 2023-08-07 DIAGNOSIS — I110 Hypertensive heart disease with heart failure: Secondary | ICD-10-CM | POA: Diagnosis not present

## 2023-08-07 DIAGNOSIS — J9601 Acute respiratory failure with hypoxia: Secondary | ICD-10-CM | POA: Diagnosis not present

## 2023-08-07 DIAGNOSIS — L97212 Non-pressure chronic ulcer of right calf with fat layer exposed: Secondary | ICD-10-CM | POA: Diagnosis not present

## 2023-08-07 DIAGNOSIS — I872 Venous insufficiency (chronic) (peripheral): Secondary | ICD-10-CM | POA: Diagnosis not present

## 2023-08-07 DIAGNOSIS — E1121 Type 2 diabetes mellitus with diabetic nephropathy: Secondary | ICD-10-CM | POA: Diagnosis not present

## 2023-08-07 DIAGNOSIS — I5033 Acute on chronic diastolic (congestive) heart failure: Secondary | ICD-10-CM | POA: Diagnosis not present

## 2023-08-09 DIAGNOSIS — E1121 Type 2 diabetes mellitus with diabetic nephropathy: Secondary | ICD-10-CM | POA: Diagnosis not present

## 2023-08-09 DIAGNOSIS — Z89512 Acquired absence of left leg below knee: Secondary | ICD-10-CM | POA: Diagnosis not present

## 2023-08-09 DIAGNOSIS — Z8673 Personal history of transient ischemic attack (TIA), and cerebral infarction without residual deficits: Secondary | ICD-10-CM | POA: Diagnosis not present

## 2023-08-09 DIAGNOSIS — L97212 Non-pressure chronic ulcer of right calf with fat layer exposed: Secondary | ICD-10-CM | POA: Diagnosis not present

## 2023-08-09 DIAGNOSIS — I110 Hypertensive heart disease with heart failure: Secondary | ICD-10-CM | POA: Diagnosis not present

## 2023-08-09 DIAGNOSIS — Z7902 Long term (current) use of antithrombotics/antiplatelets: Secondary | ICD-10-CM | POA: Diagnosis not present

## 2023-08-09 DIAGNOSIS — E7800 Pure hypercholesterolemia, unspecified: Secondary | ICD-10-CM | POA: Diagnosis not present

## 2023-08-09 DIAGNOSIS — I5033 Acute on chronic diastolic (congestive) heart failure: Secondary | ICD-10-CM | POA: Diagnosis not present

## 2023-08-09 DIAGNOSIS — Z7982 Long term (current) use of aspirin: Secondary | ICD-10-CM | POA: Diagnosis not present

## 2023-08-09 DIAGNOSIS — D649 Anemia, unspecified: Secondary | ICD-10-CM | POA: Diagnosis not present

## 2023-08-09 DIAGNOSIS — J9601 Acute respiratory failure with hypoxia: Secondary | ICD-10-CM | POA: Diagnosis not present

## 2023-08-09 DIAGNOSIS — I872 Venous insufficiency (chronic) (peripheral): Secondary | ICD-10-CM | POA: Diagnosis not present

## 2023-08-09 DIAGNOSIS — R1312 Dysphagia, oropharyngeal phase: Secondary | ICD-10-CM | POA: Diagnosis not present

## 2023-08-09 DIAGNOSIS — N200 Calculus of kidney: Secondary | ICD-10-CM | POA: Diagnosis not present

## 2023-08-09 DIAGNOSIS — Z9981 Dependence on supplemental oxygen: Secondary | ICD-10-CM | POA: Diagnosis not present

## 2023-08-10 DIAGNOSIS — I110 Hypertensive heart disease with heart failure: Secondary | ICD-10-CM | POA: Diagnosis not present

## 2023-08-10 DIAGNOSIS — L97212 Non-pressure chronic ulcer of right calf with fat layer exposed: Secondary | ICD-10-CM | POA: Diagnosis not present

## 2023-08-10 DIAGNOSIS — J9601 Acute respiratory failure with hypoxia: Secondary | ICD-10-CM | POA: Diagnosis not present

## 2023-08-10 DIAGNOSIS — I5033 Acute on chronic diastolic (congestive) heart failure: Secondary | ICD-10-CM | POA: Diagnosis not present

## 2023-08-10 DIAGNOSIS — E1121 Type 2 diabetes mellitus with diabetic nephropathy: Secondary | ICD-10-CM | POA: Diagnosis not present

## 2023-08-10 DIAGNOSIS — I872 Venous insufficiency (chronic) (peripheral): Secondary | ICD-10-CM | POA: Diagnosis not present

## 2023-08-14 ENCOUNTER — Ambulatory Visit (INDEPENDENT_AMBULATORY_CARE_PROVIDER_SITE_OTHER): Payer: Medicare Other | Admitting: Orthopedic Surgery

## 2023-08-14 DIAGNOSIS — L97919 Non-pressure chronic ulcer of unspecified part of right lower leg with unspecified severity: Secondary | ICD-10-CM | POA: Diagnosis not present

## 2023-08-14 DIAGNOSIS — Z89512 Acquired absence of left leg below knee: Secondary | ICD-10-CM

## 2023-08-14 DIAGNOSIS — I83019 Varicose veins of right lower extremity with ulcer of unspecified site: Secondary | ICD-10-CM | POA: Diagnosis not present

## 2023-08-17 DIAGNOSIS — I5033 Acute on chronic diastolic (congestive) heart failure: Secondary | ICD-10-CM | POA: Diagnosis not present

## 2023-08-17 DIAGNOSIS — E1121 Type 2 diabetes mellitus with diabetic nephropathy: Secondary | ICD-10-CM | POA: Diagnosis not present

## 2023-08-17 DIAGNOSIS — L97212 Non-pressure chronic ulcer of right calf with fat layer exposed: Secondary | ICD-10-CM | POA: Diagnosis not present

## 2023-08-17 DIAGNOSIS — I110 Hypertensive heart disease with heart failure: Secondary | ICD-10-CM | POA: Diagnosis not present

## 2023-08-17 DIAGNOSIS — I872 Venous insufficiency (chronic) (peripheral): Secondary | ICD-10-CM | POA: Diagnosis not present

## 2023-08-17 DIAGNOSIS — J9601 Acute respiratory failure with hypoxia: Secondary | ICD-10-CM | POA: Diagnosis not present

## 2023-08-21 ENCOUNTER — Encounter: Payer: Self-pay | Admitting: Orthopedic Surgery

## 2023-08-21 DIAGNOSIS — E1121 Type 2 diabetes mellitus with diabetic nephropathy: Secondary | ICD-10-CM | POA: Diagnosis not present

## 2023-08-21 DIAGNOSIS — I110 Hypertensive heart disease with heart failure: Secondary | ICD-10-CM | POA: Diagnosis not present

## 2023-08-21 DIAGNOSIS — J9601 Acute respiratory failure with hypoxia: Secondary | ICD-10-CM | POA: Diagnosis not present

## 2023-08-21 DIAGNOSIS — I872 Venous insufficiency (chronic) (peripheral): Secondary | ICD-10-CM | POA: Diagnosis not present

## 2023-08-21 DIAGNOSIS — L97212 Non-pressure chronic ulcer of right calf with fat layer exposed: Secondary | ICD-10-CM | POA: Diagnosis not present

## 2023-08-21 DIAGNOSIS — I5033 Acute on chronic diastolic (congestive) heart failure: Secondary | ICD-10-CM | POA: Diagnosis not present

## 2023-08-21 NOTE — Progress Notes (Signed)
Office Visit Note   Patient: [name] [name]           Date of Birth: 11-15-1955           MRN: [id number] Visit Date: 08/14/2023              Requested by: Sasser, Clarene Critchley, MD [address] [place],  [place] [address] PCP: Estanislado Pandy, MD  Chief Complaint  Patient presents with   Right Leg - Wound Check, Follow-up      HPI: Patient is a 67 year old gentleman who is status post left below-knee amputation who presents with venous ulceration right lower extremity.  Patient is currently undergoing wraps to the left lower extremity twice weekly by home health nursing.  Patient states that he struck his toes in the car yesterday complains of redness and swelling.  Assessment & Plan: Visit Diagnoses:  1. Left below-knee amputee (HCC)   2. Venous ulcer of right leg (HCC)     Plan: 3 layer compression wrap reapplied.  Discussed the importance of elevation.  Follow-Up Instructions: Return in about 1 week (around 08/21/2023).   Ortho Exam  Patient is alert, oriented, no adenopathy, well-dressed, normal affect, normal respiratory effort. Examination of the right foot patient has increased forefoot swelling with swelling of the toes.  There is no bruising or open ulcers over the toes.  Imaging: No results found. No images are attached to the encounter.  Labs: Lab Results  Component Value Date   HGBA1C 7.3 (H) 10/17/2021   HGBA1C 6.5 (H) 09/12/2019   CRP 17.6 (H) 09/14/2019   CRP 14.7 (H) 09/13/2019   CRP 14.6 (H) 09/12/2019   REPTSTATUS 11/20/2021 FINAL 11/17/2021   CULT (A) 11/17/2021    >=100,000 COLONIES/mL ESCHERICHIA COLI >=100,000 COLONIES/mL KLEBSIELLA OXYTOCA    LABORGA ESCHERICHIA COLI (A) 11/17/2021   LABORGA KLEBSIELLA OXYTOCA (A) 11/17/2021     Lab Results  Component Value Date   ALBUMIN 2.6 (L) 11/10/2021   ALBUMIN 1.8 (L) 10/25/2021   ALBUMIN 2.5 (L) 10/14/2021    Lab Results  Component Value Date   MG 2.1 09/14/2019   MG 2.3 09/13/2019   MG  1.9 09/12/2019   No results found for: "VD25OH"  No results found for: "PREALBUMIN"    Latest Ref Rng & Units 12/21/2021    9:53 AM 11/10/2021    6:54 AM 10/31/2021    5:00 AM  CBC EXTENDED  WBC 4.0 - 10.5 K/uL 7.6  6.5  7.9   RBC 4.22 - 5.81 Mil/uL 4.57  3.88  3.22   Hemoglobin 13.0 - 17.0 g/dL 34.7  9.5  7.8   HCT 42.5 - 52.0 % 35.6  33.0  26.5   Platelets 150.0 - 400.0 K/uL 230.0  176  243   NEUT# 1.4 - 7.7 K/uL 5.3  4.3    Lymph# 0.7 - 4.0 K/uL 0.9  0.8       There is no height or weight on file to calculate BMI.  Orders:  No orders of the defined types were placed in this encounter.  No orders of the defined types were placed in this encounter.    Procedures: No procedures performed  Clinical Data: No additional findings.  ROS:  All other systems negative, except as noted in the HPI. Review of Systems  Objective: Vital Signs: There were no vitals taken for this visit.  Specialty Comments:  No specialty comments available.  PMFS History: Patient Active Problem List  Diagnosis Date Noted   Acute on chronic diastolic (congestive) heart failure (HCC)    Controlled type 2 diabetes mellitus with hyperglycemia, without long-term current use of insulin (HCC)    Phantom limb pain (HCC)    Left below-knee amputee (HCC) 10/25/2021   Left leg pain    Osteomyelitis of ankle and foot (HCC) 10/15/2021   Anemia 10/15/2021   Pressure ulcer 10/15/2021   History of CVA (cerebrovascular accident) 10/15/2021   Septic arthritis of ankle and foot region (HCC) 10/15/2021   AKI (acute kidney injury) (HCC) 10/14/2021   Pneumonia due to COVID-19 virus 09/09/2019   Hypokalemia 09/09/2019   Acute respiratory failure with hypoxia (HCC) 09/09/2019   Essential hypertension 10/14/2014   Chronic diastolic heart failure (HCC) 10/14/2014   Morbid obesity (HCC) 07/22/2013   Varicose veins of bilateral lower extremities with other complications 07/22/2013   OSA (obstructive sleep  apnea) 07/22/2013   Venous stasis ulcers (HCC) 07/22/2013   Diabetes mellitus (HCC) 01/03/2011   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HCC)    Diverticulosis    Essential hypertension    Gastritis    History of nephrolithiasis    History of open leg wound    History of stroke 03/19/2003   Lymphedema    Obstructive sleep apnea    Peptic ulcer    TIA (transient ischemic attack)    Varicose veins    Laser ablation of left great saphenous vein 2009    Family History  Problem Relation Age of Onset   Bone cancer Mother    Hypertension Father    Pancreatic cancer Maternal Grandmother    Stroke Paternal Grandmother    Diabetes Paternal Grandfather    Heart attack Brother    Hypertension Brother    Heart attack Sister     Past Surgical History:  Procedure Laterality Date   AMPUTATION Left 10/21/2021   Procedure: LEFT BELOW KNEE AMPUTATION;  Surgeon: Nadara Mustard, MD;  Location: [facility];  Service: Orthopedics;  Laterality: Left;   BIOPSY  10/19/2021   Procedure: BIOPSY;  Surgeon: Imogene Burn, MD;  Location: [facility] ENDOSCOPY;  Service: Gastroenterology;;   COLONOSCOPY N/A 10/19/2021   Procedure: COLONOSCOPY;  Surgeon: Imogene Burn, MD;  Location: [facility] ENDOSCOPY;  Service: Gastroenterology;  Laterality: N/A;   CYSTOSCOPY WITH RETROGRADE PYELOGRAM, URETEROSCOPY AND STENT PLACEMENT Right 08/02/2015   Procedure: CYSTOSCOPY WITH RETROGRADE PYELOGRAM, URETEROSCOPY , STONE EXTRACTION AND STENT PLACEMENT;  Surgeon: Malen Gauze, MD;  Location: [facility];  Service: Urology;  Laterality: Right;   ESOPHAGOGASTRODUODENOSCOPY (EGD) WITH PROPOFOL N/A 10/19/2021   Procedure: ESOPHAGOGASTRODUODENOSCOPY (EGD) WITH PROPOFOL;  Surgeon: Imogene Burn, MD;  Location: [facility] ENDOSCOPY;  Service: Gastroenterology;  Laterality: N/A;   HOLMIUM LASER APPLICATION Right 08/02/2015   Procedure: HOLMIUM LASER APPLICATION;  Surgeon: Malen Gauze, MD;  Location: [facility];   Service: Urology;  Laterality: Right;   KIDNEY STONE SURGERY     POLYPECTOMY  10/19/2021   Procedure: POLYPECTOMY;  Surgeon: Imogene Burn, MD;  Location: [facility] ENDOSCOPY;  Service: Gastroenterology;;   Gabriela Eves  1984   VEIN SURGERY Left 06/2010   Laser ablation   Social History   Occupational History   Occupation: controll room operator     Employer: [employer]  Tobacco Use   Smoking status: Never    Passive exposure: Never   Smokeless tobacco: Never  Vaping Use   Vaping status: Never Used  Substance and  Sexual Activity   Alcohol use: No    Alcohol/week: 0.0 standard drinks of alcohol   Drug use: No   Sexual activity: Not on file

## 2023-08-24 DIAGNOSIS — L97212 Non-pressure chronic ulcer of right calf with fat layer exposed: Secondary | ICD-10-CM | POA: Diagnosis not present

## 2023-08-24 DIAGNOSIS — I872 Venous insufficiency (chronic) (peripheral): Secondary | ICD-10-CM | POA: Diagnosis not present

## 2023-08-24 DIAGNOSIS — I110 Hypertensive heart disease with heart failure: Secondary | ICD-10-CM | POA: Diagnosis not present

## 2023-08-24 DIAGNOSIS — J9601 Acute respiratory failure with hypoxia: Secondary | ICD-10-CM | POA: Diagnosis not present

## 2023-08-24 DIAGNOSIS — I5033 Acute on chronic diastolic (congestive) heart failure: Secondary | ICD-10-CM | POA: Diagnosis not present

## 2023-08-24 DIAGNOSIS — E1121 Type 2 diabetes mellitus with diabetic nephropathy: Secondary | ICD-10-CM | POA: Diagnosis not present

## 2023-08-28 DIAGNOSIS — J9601 Acute respiratory failure with hypoxia: Secondary | ICD-10-CM | POA: Diagnosis not present

## 2023-08-28 DIAGNOSIS — L97212 Non-pressure chronic ulcer of right calf with fat layer exposed: Secondary | ICD-10-CM | POA: Diagnosis not present

## 2023-08-28 DIAGNOSIS — I5033 Acute on chronic diastolic (congestive) heart failure: Secondary | ICD-10-CM | POA: Diagnosis not present

## 2023-08-28 DIAGNOSIS — E1121 Type 2 diabetes mellitus with diabetic nephropathy: Secondary | ICD-10-CM | POA: Diagnosis not present

## 2023-08-28 DIAGNOSIS — I872 Venous insufficiency (chronic) (peripheral): Secondary | ICD-10-CM | POA: Diagnosis not present

## 2023-08-28 DIAGNOSIS — I110 Hypertensive heart disease with heart failure: Secondary | ICD-10-CM | POA: Diagnosis not present

## 2023-08-31 ENCOUNTER — Telehealth: Payer: Self-pay | Admitting: Orthopedic Surgery

## 2023-08-31 DIAGNOSIS — E1121 Type 2 diabetes mellitus with diabetic nephropathy: Secondary | ICD-10-CM | POA: Diagnosis not present

## 2023-08-31 DIAGNOSIS — I872 Venous insufficiency (chronic) (peripheral): Secondary | ICD-10-CM | POA: Diagnosis not present

## 2023-08-31 DIAGNOSIS — I110 Hypertensive heart disease with heart failure: Secondary | ICD-10-CM | POA: Diagnosis not present

## 2023-08-31 DIAGNOSIS — J9601 Acute respiratory failure with hypoxia: Secondary | ICD-10-CM | POA: Diagnosis not present

## 2023-08-31 DIAGNOSIS — L97212 Non-pressure chronic ulcer of right calf with fat layer exposed: Secondary | ICD-10-CM | POA: Diagnosis not present

## 2023-08-31 DIAGNOSIS — I5033 Acute on chronic diastolic (congestive) heart failure: Secondary | ICD-10-CM | POA: Diagnosis not present

## 2023-08-31 NOTE — Telephone Encounter (Signed)
[facility] called stating top of pt foot is macerated and he told them Vaseline was ordered to put on the wound, and nurse feels it needs more and does he still want the wound cleaned with soap and water but nurse states it is not the cleanest of environments and would he like for them to change anything nurse states leg is completely purple and not getting better please advised  CB# [phone] Judeth Cornfield

## 2023-09-03 ENCOUNTER — Telehealth: Payer: Self-pay

## 2023-09-03 NOTE — Telephone Encounter (Signed)
I called and sw Judeth Cornfield and advised that there is no change in the wound care orders. She states that the pt is very noncompliant with elevation that he sits in his wheelchair all day legs down. They will continue to see him twice a week wash leg with soap and water, silver alginate to the wound and 4 layer compression wrap. Will call with any other questions.

## 2023-09-03 NOTE — Telephone Encounter (Signed)
I called and lm on vm to advise that we last saw the pt in the office the end of November and had ordered that he continue with his compression dressings. His foot and toes were very swollen and I am not sure how compliant he is with elevation. I asked that she call back with dressing recommendation or if she feels the pt needs to be seen sooner that his appt on 09/19/2022

## 2023-09-03 NOTE — Telephone Encounter (Signed)
[name] [name] with [name] [name] would like a call back at [phone].  Would not state what she needed.  Please advise.  Thank you.

## 2023-09-04 DIAGNOSIS — E1121 Type 2 diabetes mellitus with diabetic nephropathy: Secondary | ICD-10-CM | POA: Diagnosis not present

## 2023-09-04 DIAGNOSIS — J9601 Acute respiratory failure with hypoxia: Secondary | ICD-10-CM | POA: Diagnosis not present

## 2023-09-04 DIAGNOSIS — I872 Venous insufficiency (chronic) (peripheral): Secondary | ICD-10-CM | POA: Diagnosis not present

## 2023-09-04 DIAGNOSIS — I5033 Acute on chronic diastolic (congestive) heart failure: Secondary | ICD-10-CM | POA: Diagnosis not present

## 2023-09-04 DIAGNOSIS — L97212 Non-pressure chronic ulcer of right calf with fat layer exposed: Secondary | ICD-10-CM | POA: Diagnosis not present

## 2023-09-04 DIAGNOSIS — I110 Hypertensive heart disease with heart failure: Secondary | ICD-10-CM | POA: Diagnosis not present

## 2023-09-07 DIAGNOSIS — I110 Hypertensive heart disease with heart failure: Secondary | ICD-10-CM | POA: Diagnosis not present

## 2023-09-07 DIAGNOSIS — L97212 Non-pressure chronic ulcer of right calf with fat layer exposed: Secondary | ICD-10-CM | POA: Diagnosis not present

## 2023-09-07 DIAGNOSIS — I5033 Acute on chronic diastolic (congestive) heart failure: Secondary | ICD-10-CM | POA: Diagnosis not present

## 2023-09-07 DIAGNOSIS — I872 Venous insufficiency (chronic) (peripheral): Secondary | ICD-10-CM | POA: Diagnosis not present

## 2023-09-07 DIAGNOSIS — E1121 Type 2 diabetes mellitus with diabetic nephropathy: Secondary | ICD-10-CM | POA: Diagnosis not present

## 2023-09-07 DIAGNOSIS — J9601 Acute respiratory failure with hypoxia: Secondary | ICD-10-CM | POA: Diagnosis not present

## 2023-09-08 DIAGNOSIS — Z89512 Acquired absence of left leg below knee: Secondary | ICD-10-CM | POA: Diagnosis not present

## 2023-09-08 DIAGNOSIS — J9601 Acute respiratory failure with hypoxia: Secondary | ICD-10-CM | POA: Diagnosis not present

## 2023-09-08 DIAGNOSIS — Z7902 Long term (current) use of antithrombotics/antiplatelets: Secondary | ICD-10-CM | POA: Diagnosis not present

## 2023-09-08 DIAGNOSIS — D649 Anemia, unspecified: Secondary | ICD-10-CM | POA: Diagnosis not present

## 2023-09-08 DIAGNOSIS — Z8673 Personal history of transient ischemic attack (TIA), and cerebral infarction without residual deficits: Secondary | ICD-10-CM | POA: Diagnosis not present

## 2023-09-08 DIAGNOSIS — I110 Hypertensive heart disease with heart failure: Secondary | ICD-10-CM | POA: Diagnosis not present

## 2023-09-08 DIAGNOSIS — Z7982 Long term (current) use of aspirin: Secondary | ICD-10-CM | POA: Diagnosis not present

## 2023-09-08 DIAGNOSIS — L97821 Non-pressure chronic ulcer of other part of left lower leg limited to breakdown of skin: Secondary | ICD-10-CM | POA: Diagnosis not present

## 2023-09-08 DIAGNOSIS — N200 Calculus of kidney: Secondary | ICD-10-CM | POA: Diagnosis not present

## 2023-09-08 DIAGNOSIS — E7800 Pure hypercholesterolemia, unspecified: Secondary | ICD-10-CM | POA: Diagnosis not present

## 2023-09-08 DIAGNOSIS — E1121 Type 2 diabetes mellitus with diabetic nephropathy: Secondary | ICD-10-CM | POA: Diagnosis not present

## 2023-09-08 DIAGNOSIS — I5033 Acute on chronic diastolic (congestive) heart failure: Secondary | ICD-10-CM | POA: Diagnosis not present

## 2023-09-08 DIAGNOSIS — I872 Venous insufficiency (chronic) (peripheral): Secondary | ICD-10-CM | POA: Diagnosis not present

## 2023-09-08 DIAGNOSIS — L97211 Non-pressure chronic ulcer of right calf limited to breakdown of skin: Secondary | ICD-10-CM | POA: Diagnosis not present

## 2023-09-08 DIAGNOSIS — Z9981 Dependence on supplemental oxygen: Secondary | ICD-10-CM | POA: Diagnosis not present

## 2023-09-08 DIAGNOSIS — R1312 Dysphagia, oropharyngeal phase: Secondary | ICD-10-CM | POA: Diagnosis not present

## 2023-09-11 DIAGNOSIS — E1121 Type 2 diabetes mellitus with diabetic nephropathy: Secondary | ICD-10-CM | POA: Diagnosis not present

## 2023-09-11 DIAGNOSIS — J9601 Acute respiratory failure with hypoxia: Secondary | ICD-10-CM | POA: Diagnosis not present

## 2023-09-11 DIAGNOSIS — I110 Hypertensive heart disease with heart failure: Secondary | ICD-10-CM | POA: Diagnosis not present

## 2023-09-11 DIAGNOSIS — L97821 Non-pressure chronic ulcer of other part of left lower leg limited to breakdown of skin: Secondary | ICD-10-CM | POA: Diagnosis not present

## 2023-09-11 DIAGNOSIS — I872 Venous insufficiency (chronic) (peripheral): Secondary | ICD-10-CM | POA: Diagnosis not present

## 2023-09-11 DIAGNOSIS — L97211 Non-pressure chronic ulcer of right calf limited to breakdown of skin: Secondary | ICD-10-CM | POA: Diagnosis not present

## 2023-09-14 DIAGNOSIS — E1121 Type 2 diabetes mellitus with diabetic nephropathy: Secondary | ICD-10-CM | POA: Diagnosis not present

## 2023-09-14 DIAGNOSIS — L97211 Non-pressure chronic ulcer of right calf limited to breakdown of skin: Secondary | ICD-10-CM | POA: Diagnosis not present

## 2023-09-14 DIAGNOSIS — I872 Venous insufficiency (chronic) (peripheral): Secondary | ICD-10-CM | POA: Diagnosis not present

## 2023-09-14 DIAGNOSIS — I110 Hypertensive heart disease with heart failure: Secondary | ICD-10-CM | POA: Diagnosis not present

## 2023-09-14 DIAGNOSIS — J9601 Acute respiratory failure with hypoxia: Secondary | ICD-10-CM | POA: Diagnosis not present

## 2023-09-14 DIAGNOSIS — L97821 Non-pressure chronic ulcer of other part of left lower leg limited to breakdown of skin: Secondary | ICD-10-CM | POA: Diagnosis not present

## 2023-09-18 DIAGNOSIS — J9601 Acute respiratory failure with hypoxia: Secondary | ICD-10-CM | POA: Diagnosis not present

## 2023-09-18 DIAGNOSIS — L97821 Non-pressure chronic ulcer of other part of left lower leg limited to breakdown of skin: Secondary | ICD-10-CM | POA: Diagnosis not present

## 2023-09-18 DIAGNOSIS — I872 Venous insufficiency (chronic) (peripheral): Secondary | ICD-10-CM | POA: Diagnosis not present

## 2023-09-18 DIAGNOSIS — I110 Hypertensive heart disease with heart failure: Secondary | ICD-10-CM | POA: Diagnosis not present

## 2023-09-18 DIAGNOSIS — E1121 Type 2 diabetes mellitus with diabetic nephropathy: Secondary | ICD-10-CM | POA: Diagnosis not present

## 2023-09-18 DIAGNOSIS — L97211 Non-pressure chronic ulcer of right calf limited to breakdown of skin: Secondary | ICD-10-CM | POA: Diagnosis not present

## 2023-09-20 ENCOUNTER — Ambulatory Visit: Payer: Medicare Other | Admitting: Orthopedic Surgery

## 2023-09-21 DIAGNOSIS — I872 Venous insufficiency (chronic) (peripheral): Secondary | ICD-10-CM | POA: Diagnosis not present

## 2023-09-21 DIAGNOSIS — E1121 Type 2 diabetes mellitus with diabetic nephropathy: Secondary | ICD-10-CM | POA: Diagnosis not present

## 2023-09-21 DIAGNOSIS — J9601 Acute respiratory failure with hypoxia: Secondary | ICD-10-CM | POA: Diagnosis not present

## 2023-09-21 DIAGNOSIS — L97821 Non-pressure chronic ulcer of other part of left lower leg limited to breakdown of skin: Secondary | ICD-10-CM | POA: Diagnosis not present

## 2023-09-21 DIAGNOSIS — L97211 Non-pressure chronic ulcer of right calf limited to breakdown of skin: Secondary | ICD-10-CM | POA: Diagnosis not present

## 2023-09-21 DIAGNOSIS — I110 Hypertensive heart disease with heart failure: Secondary | ICD-10-CM | POA: Diagnosis not present

## 2023-09-25 ENCOUNTER — Other Ambulatory Visit: Payer: Self-pay | Admitting: Cardiology

## 2023-09-25 DIAGNOSIS — L97211 Non-pressure chronic ulcer of right calf limited to breakdown of skin: Secondary | ICD-10-CM | POA: Diagnosis not present

## 2023-09-25 DIAGNOSIS — I872 Venous insufficiency (chronic) (peripheral): Secondary | ICD-10-CM | POA: Diagnosis not present

## 2023-09-25 DIAGNOSIS — I110 Hypertensive heart disease with heart failure: Secondary | ICD-10-CM | POA: Diagnosis not present

## 2023-09-25 DIAGNOSIS — J9601 Acute respiratory failure with hypoxia: Secondary | ICD-10-CM | POA: Diagnosis not present

## 2023-09-25 DIAGNOSIS — L97821 Non-pressure chronic ulcer of other part of left lower leg limited to breakdown of skin: Secondary | ICD-10-CM | POA: Diagnosis not present

## 2023-09-25 DIAGNOSIS — E1121 Type 2 diabetes mellitus with diabetic nephropathy: Secondary | ICD-10-CM | POA: Diagnosis not present

## 2023-09-28 DIAGNOSIS — I110 Hypertensive heart disease with heart failure: Secondary | ICD-10-CM | POA: Diagnosis not present

## 2023-09-28 DIAGNOSIS — L97821 Non-pressure chronic ulcer of other part of left lower leg limited to breakdown of skin: Secondary | ICD-10-CM | POA: Diagnosis not present

## 2023-09-28 DIAGNOSIS — I872 Venous insufficiency (chronic) (peripheral): Secondary | ICD-10-CM | POA: Diagnosis not present

## 2023-09-28 DIAGNOSIS — E1121 Type 2 diabetes mellitus with diabetic nephropathy: Secondary | ICD-10-CM | POA: Diagnosis not present

## 2023-09-28 DIAGNOSIS — L97211 Non-pressure chronic ulcer of right calf limited to breakdown of skin: Secondary | ICD-10-CM | POA: Diagnosis not present

## 2023-09-28 DIAGNOSIS — J9601 Acute respiratory failure with hypoxia: Secondary | ICD-10-CM | POA: Diagnosis not present

## 2023-10-01 ENCOUNTER — Encounter: Payer: Self-pay | Admitting: Orthopedic Surgery

## 2023-10-01 ENCOUNTER — Ambulatory Visit (INDEPENDENT_AMBULATORY_CARE_PROVIDER_SITE_OTHER): Payer: Medicare Other | Admitting: Orthopedic Surgery

## 2023-10-01 DIAGNOSIS — I83019 Varicose veins of right lower extremity with ulcer of unspecified site: Secondary | ICD-10-CM | POA: Diagnosis not present

## 2023-10-01 DIAGNOSIS — L97919 Non-pressure chronic ulcer of unspecified part of right lower leg with unspecified severity: Secondary | ICD-10-CM

## 2023-10-01 DIAGNOSIS — Z89512 Acquired absence of left leg below knee: Secondary | ICD-10-CM

## 2023-10-01 NOTE — Progress Notes (Signed)
 Office Visit Note   Patient: [name] [name]           Date of Birth: 05/24/1956           MRN: [id number] Visit Date: 10/01/2023              Requested by: Sasser, Deward LELON, MD 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  Patient is undergoing twice a week compression wraps at home.  Assessment & Plan: Visit Diagnoses:  1. Left below-knee amputee (HCC)   2. Venous ulcer of right leg (HCC)     Plan: Continue current compression Dynaflex wrap applied today.  Follow-Up Instructions: Return in about 4 weeks (around 10/29/2023).   Ortho Exam  Patient is alert, oriented, no adenopathy, well-dressed, normal affect, normal respiratory effort. Examination there is some excoriation around the knee.  There is less swelling in his foot.  Patient still has dermatitis of the leg with brawny skin color changes there is some weeping edema from the proximal tibial plateau region with some dermatitis from the drainage.  There is less swelling in his foot.  Imaging: No results found. No images are attached to the encounter.  Labs: Lab Results  Component Value Date   HGBA1C 7.3 (H) 10/17/2021   HGBA1C 6.5 (H) 09/12/2019   CRP 17.6 (H) 09/14/2019   CRP 14.7 (H) 09/13/2019   CRP 14.6 (H) 09/12/2019   REPTSTATUS 11/20/2021 FINAL 11/17/2021   CULT (A) 11/17/2021    >=100,000 COLONIES/mL ESCHERICHIA COLI >=100,000 COLONIES/mL KLEBSIELLA OXYTOCA    LABORGA ESCHERICHIA COLI (A) 11/17/2021   LABORGA KLEBSIELLA OXYTOCA (A) 11/17/2021     Lab Results  Component Value Date   ALBUMIN 2.6 (L) 11/10/2021   ALBUMIN 1.8 (L) 10/25/2021   ALBUMIN 2.5 (L) 10/14/2021    Lab Results  Component Value Date   MG 2.1 09/14/2019   MG 2.3 09/13/2019   MG 1.9  09/12/2019   No results found for: VD25OH  No results found for: PREALBUMIN    Latest Ref Rng & Units 12/21/2021    9:53 AM 11/10/2021    6:54 AM 10/31/2021    5:00 AM  CBC EXTENDED  WBC 4.0 - 10.5 K/uL 7.6  6.5  7.9   RBC 4.22 - 5.81 Mil/uL 4.57  3.88  3.22   Hemoglobin 13.0 - 17.0 g/dL 88.5  9.5  7.8   HCT 60.9 - 52.0 % 35.6  33.0  26.5   Platelets 150.0 - 400.0 K/uL 230.0  176  243   NEUT# 1.4 - 7.7 K/uL 5.3  4.3    Lymph# 0.7 - 4.0 K/uL 0.9  0.8       There is no height or weight on file to calculate BMI.  Orders:  No orders of the defined types were placed in this encounter.  No orders of the defined types were placed in this encounter.    Procedures: No procedures performed  Clinical Data: No additional findings.  ROS:  All other systems negative, except as noted in the HPI. Review of Systems  Objective: Vital Signs: There were no vitals taken for this visit.  Specialty Comments:  No specialty comments available.  PMFS History: Patient Active Problem List  Diagnosis Date Noted   Acute on chronic diastolic (congestive) heart failure (HCC)    Controlled type 2 diabetes mellitus with hyperglycemia, without long-term current use of insulin  (HCC)    Phantom limb pain (HCC)    Left below-knee amputee (HCC) 10/25/2021   Left leg pain    Osteomyelitis of ankle and foot (HCC) 10/15/2021   Anemia 10/15/2021   Pressure ulcer 10/15/2021   History of CVA (cerebrovascular accident) 10/15/2021   Septic arthritis of ankle and foot region (HCC) 10/15/2021   AKI (acute kidney injury) (HCC) 10/14/2021   Pneumonia due to COVID-19 virus 09/09/2019   Hypokalemia 09/09/2019   Acute respiratory failure with hypoxia (HCC) 09/09/2019   Essential hypertension 10/14/2014   Chronic diastolic heart failure (HCC) 10/14/2014   Morbid obesity (HCC) 07/22/2013   Varicose veins of bilateral lower extremities with other complications 07/22/2013   OSA (obstructive sleep apnea)  07/22/2013   Venous stasis ulcers (HCC) 07/22/2013   Diabetes mellitus (HCC) 01/03/2011   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HCC)    Diverticulosis    Essential hypertension    Gastritis    History of nephrolithiasis    History of open leg wound    History of stroke 03/19/2003   Lymphedema    Obstructive sleep apnea    Peptic ulcer    TIA (transient ischemic attack)    Varicose veins    Laser ablation of left great saphenous vein 2009    Family History  Problem Relation Age of Onset   Bone cancer Mother    Hypertension Father    Pancreatic cancer Maternal Grandmother    Stroke Paternal Grandmother    Diabetes Paternal Grandfather    Heart attack Brother    Hypertension Brother    Heart attack Sister     Past Surgical History:  Procedure Laterality Date   AMPUTATION Left 10/21/2021   Procedure: LEFT BELOW KNEE AMPUTATION;  Surgeon: Harden Jerona GAILS, MD;  Location: [facility];  Service: Orthopedics;  Laterality: Left;   BIOPSY  10/19/2021   Procedure: BIOPSY;  Surgeon: Federico Rosario BROCKS, MD;  Location: [facility] ENDOSCOPY;  Service: Gastroenterology;;   COLONOSCOPY N/A 10/19/2021   Procedure: COLONOSCOPY;  Surgeon: Federico Rosario BROCKS, MD;  Location: [facility] ENDOSCOPY;  Service: Gastroenterology;  Laterality: N/A;   CYSTOSCOPY WITH RETROGRADE PYELOGRAM, URETEROSCOPY AND STENT PLACEMENT Right 08/02/2015   Procedure: CYSTOSCOPY WITH RETROGRADE PYELOGRAM, URETEROSCOPY , STONE EXTRACTION AND STENT PLACEMENT;  Surgeon: Belvie LITTIE Clara, MD;  Location: [facility];  Service: Urology;  Laterality: Right;   ESOPHAGOGASTRODUODENOSCOPY (EGD) WITH PROPOFOL  N/A 10/19/2021   Procedure: ESOPHAGOGASTRODUODENOSCOPY (EGD) WITH PROPOFOL ;  Surgeon: Federico Rosario BROCKS, MD;  Location: [facility] ENDOSCOPY;  Service: Gastroenterology;  Laterality: N/A;   HOLMIUM LASER APPLICATION Right 08/02/2015   Procedure: HOLMIUM LASER APPLICATION;  Surgeon: Belvie LITTIE Clara, MD;  Location: [facility];  Service:  Urology;  Laterality: Right;   KIDNEY STONE SURGERY     POLYPECTOMY  10/19/2021   Procedure: POLYPECTOMY;  Surgeon: Federico Rosario BROCKS, MD;  Location: [facility] ENDOSCOPY;  Service: Gastroenterology;;   KATY  1984   VEIN SURGERY Left 06/2010   Laser ablation   Social History   Occupational History   Occupation: controll room operator     Employer: [employer]  Tobacco Use   Smoking status: Never    Passive exposure: Never   Smokeless tobacco: Never  Vaping Use   Vaping status: Never Used  Substance and  Sexual Activity   Alcohol  use: No    Alcohol /week: 0.0 standard drinks of alcohol    Drug use: No   Sexual activity: Not on file

## 2023-10-05 DIAGNOSIS — E1121 Type 2 diabetes mellitus with diabetic nephropathy: Secondary | ICD-10-CM | POA: Diagnosis not present

## 2023-10-05 DIAGNOSIS — L97821 Non-pressure chronic ulcer of other part of left lower leg limited to breakdown of skin: Secondary | ICD-10-CM | POA: Diagnosis not present

## 2023-10-05 DIAGNOSIS — I110 Hypertensive heart disease with heart failure: Secondary | ICD-10-CM | POA: Diagnosis not present

## 2023-10-05 DIAGNOSIS — L97211 Non-pressure chronic ulcer of right calf limited to breakdown of skin: Secondary | ICD-10-CM | POA: Diagnosis not present

## 2023-10-05 DIAGNOSIS — I872 Venous insufficiency (chronic) (peripheral): Secondary | ICD-10-CM | POA: Diagnosis not present

## 2023-10-05 DIAGNOSIS — J9601 Acute respiratory failure with hypoxia: Secondary | ICD-10-CM | POA: Diagnosis not present

## 2023-10-08 DIAGNOSIS — N200 Calculus of kidney: Secondary | ICD-10-CM | POA: Diagnosis not present

## 2023-10-08 DIAGNOSIS — L97821 Non-pressure chronic ulcer of other part of left lower leg limited to breakdown of skin: Secondary | ICD-10-CM | POA: Diagnosis not present

## 2023-10-08 DIAGNOSIS — Z8673 Personal history of transient ischemic attack (TIA), and cerebral infarction without residual deficits: Secondary | ICD-10-CM | POA: Diagnosis not present

## 2023-10-08 DIAGNOSIS — Z7982 Long term (current) use of aspirin: Secondary | ICD-10-CM | POA: Diagnosis not present

## 2023-10-08 DIAGNOSIS — I110 Hypertensive heart disease with heart failure: Secondary | ICD-10-CM | POA: Diagnosis not present

## 2023-10-08 DIAGNOSIS — I872 Venous insufficiency (chronic) (peripheral): Secondary | ICD-10-CM | POA: Diagnosis not present

## 2023-10-08 DIAGNOSIS — L97211 Non-pressure chronic ulcer of right calf limited to breakdown of skin: Secondary | ICD-10-CM | POA: Diagnosis not present

## 2023-10-08 DIAGNOSIS — D649 Anemia, unspecified: Secondary | ICD-10-CM | POA: Diagnosis not present

## 2023-10-08 DIAGNOSIS — Z9981 Dependence on supplemental oxygen: Secondary | ICD-10-CM | POA: Diagnosis not present

## 2023-10-08 DIAGNOSIS — R1312 Dysphagia, oropharyngeal phase: Secondary | ICD-10-CM | POA: Diagnosis not present

## 2023-10-08 DIAGNOSIS — Z89512 Acquired absence of left leg below knee: Secondary | ICD-10-CM | POA: Diagnosis not present

## 2023-10-08 DIAGNOSIS — J9601 Acute respiratory failure with hypoxia: Secondary | ICD-10-CM | POA: Diagnosis not present

## 2023-10-08 DIAGNOSIS — Z7902 Long term (current) use of antithrombotics/antiplatelets: Secondary | ICD-10-CM | POA: Diagnosis not present

## 2023-10-08 DIAGNOSIS — I5033 Acute on chronic diastolic (congestive) heart failure: Secondary | ICD-10-CM | POA: Diagnosis not present

## 2023-10-08 DIAGNOSIS — E1121 Type 2 diabetes mellitus with diabetic nephropathy: Secondary | ICD-10-CM | POA: Diagnosis not present

## 2023-10-08 DIAGNOSIS — E7800 Pure hypercholesterolemia, unspecified: Secondary | ICD-10-CM | POA: Diagnosis not present

## 2023-10-09 DIAGNOSIS — L97821 Non-pressure chronic ulcer of other part of left lower leg limited to breakdown of skin: Secondary | ICD-10-CM | POA: Diagnosis not present

## 2023-10-09 DIAGNOSIS — E1121 Type 2 diabetes mellitus with diabetic nephropathy: Secondary | ICD-10-CM | POA: Diagnosis not present

## 2023-10-09 DIAGNOSIS — L97211 Non-pressure chronic ulcer of right calf limited to breakdown of skin: Secondary | ICD-10-CM | POA: Diagnosis not present

## 2023-10-09 DIAGNOSIS — I872 Venous insufficiency (chronic) (peripheral): Secondary | ICD-10-CM | POA: Diagnosis not present

## 2023-10-09 DIAGNOSIS — I110 Hypertensive heart disease with heart failure: Secondary | ICD-10-CM | POA: Diagnosis not present

## 2023-10-09 DIAGNOSIS — J9601 Acute respiratory failure with hypoxia: Secondary | ICD-10-CM | POA: Diagnosis not present

## 2023-10-12 DIAGNOSIS — L97211 Non-pressure chronic ulcer of right calf limited to breakdown of skin: Secondary | ICD-10-CM | POA: Diagnosis not present

## 2023-10-12 DIAGNOSIS — I872 Venous insufficiency (chronic) (peripheral): Secondary | ICD-10-CM | POA: Diagnosis not present

## 2023-10-12 DIAGNOSIS — J9601 Acute respiratory failure with hypoxia: Secondary | ICD-10-CM | POA: Diagnosis not present

## 2023-10-12 DIAGNOSIS — E1121 Type 2 diabetes mellitus with diabetic nephropathy: Secondary | ICD-10-CM | POA: Diagnosis not present

## 2023-10-12 DIAGNOSIS — I110 Hypertensive heart disease with heart failure: Secondary | ICD-10-CM | POA: Diagnosis not present

## 2023-10-12 DIAGNOSIS — I509 Heart failure, unspecified: Secondary | ICD-10-CM | POA: Diagnosis not present

## 2023-10-12 DIAGNOSIS — L97821 Non-pressure chronic ulcer of other part of left lower leg limited to breakdown of skin: Secondary | ICD-10-CM | POA: Diagnosis not present

## 2023-10-14 DIAGNOSIS — I2510 Atherosclerotic heart disease of native coronary artery without angina pectoris: Secondary | ICD-10-CM | POA: Diagnosis not present

## 2023-10-14 DIAGNOSIS — Z9981 Dependence on supplemental oxygen: Secondary | ICD-10-CM | POA: Diagnosis not present

## 2023-10-14 DIAGNOSIS — E7800 Pure hypercholesterolemia, unspecified: Secondary | ICD-10-CM | POA: Diagnosis not present

## 2023-10-14 DIAGNOSIS — L03115 Cellulitis of right lower limb: Secondary | ICD-10-CM | POA: Diagnosis not present

## 2023-10-14 DIAGNOSIS — I110 Hypertensive heart disease with heart failure: Secondary | ICD-10-CM | POA: Diagnosis not present

## 2023-10-14 DIAGNOSIS — I517 Cardiomegaly: Secondary | ICD-10-CM | POA: Diagnosis not present

## 2023-10-14 DIAGNOSIS — I5023 Acute on chronic systolic (congestive) heart failure: Secondary | ICD-10-CM | POA: Diagnosis not present

## 2023-10-14 DIAGNOSIS — R0602 Shortness of breath: Secondary | ICD-10-CM | POA: Diagnosis not present

## 2023-10-14 DIAGNOSIS — R0989 Other specified symptoms and signs involving the circulatory and respiratory systems: Secondary | ICD-10-CM | POA: Diagnosis not present

## 2023-10-14 DIAGNOSIS — L0390 Cellulitis, unspecified: Secondary | ICD-10-CM | POA: Diagnosis not present

## 2023-10-14 DIAGNOSIS — I69351 Hemiplegia and hemiparesis following cerebral infarction affecting right dominant side: Secondary | ICD-10-CM | POA: Diagnosis not present

## 2023-10-14 DIAGNOSIS — R9389 Abnormal findings on diagnostic imaging of other specified body structures: Secondary | ICD-10-CM | POA: Diagnosis not present

## 2023-10-16 DIAGNOSIS — L97211 Non-pressure chronic ulcer of right calf limited to breakdown of skin: Secondary | ICD-10-CM | POA: Diagnosis not present

## 2023-10-16 DIAGNOSIS — J9601 Acute respiratory failure with hypoxia: Secondary | ICD-10-CM | POA: Diagnosis not present

## 2023-10-16 DIAGNOSIS — I872 Venous insufficiency (chronic) (peripheral): Secondary | ICD-10-CM | POA: Diagnosis not present

## 2023-10-16 DIAGNOSIS — L97821 Non-pressure chronic ulcer of other part of left lower leg limited to breakdown of skin: Secondary | ICD-10-CM | POA: Diagnosis not present

## 2023-10-16 DIAGNOSIS — I110 Hypertensive heart disease with heart failure: Secondary | ICD-10-CM | POA: Diagnosis not present

## 2023-10-16 DIAGNOSIS — E1121 Type 2 diabetes mellitus with diabetic nephropathy: Secondary | ICD-10-CM | POA: Diagnosis not present

## 2023-10-18 DIAGNOSIS — H5213 Myopia, bilateral: Secondary | ICD-10-CM | POA: Diagnosis not present

## 2023-10-18 DIAGNOSIS — H40053 Ocular hypertension, bilateral: Secondary | ICD-10-CM | POA: Diagnosis not present

## 2023-10-18 DIAGNOSIS — H524 Presbyopia: Secondary | ICD-10-CM | POA: Diagnosis not present

## 2023-10-19 DIAGNOSIS — L97821 Non-pressure chronic ulcer of other part of left lower leg limited to breakdown of skin: Secondary | ICD-10-CM | POA: Diagnosis not present

## 2023-10-19 DIAGNOSIS — J9601 Acute respiratory failure with hypoxia: Secondary | ICD-10-CM | POA: Diagnosis not present

## 2023-10-19 DIAGNOSIS — I872 Venous insufficiency (chronic) (peripheral): Secondary | ICD-10-CM | POA: Diagnosis not present

## 2023-10-19 DIAGNOSIS — I110 Hypertensive heart disease with heart failure: Secondary | ICD-10-CM | POA: Diagnosis not present

## 2023-10-19 DIAGNOSIS — E1121 Type 2 diabetes mellitus with diabetic nephropathy: Secondary | ICD-10-CM | POA: Diagnosis not present

## 2023-10-19 DIAGNOSIS — L97211 Non-pressure chronic ulcer of right calf limited to breakdown of skin: Secondary | ICD-10-CM | POA: Diagnosis not present

## 2023-10-23 DIAGNOSIS — I110 Hypertensive heart disease with heart failure: Secondary | ICD-10-CM | POA: Diagnosis not present

## 2023-10-23 DIAGNOSIS — L97211 Non-pressure chronic ulcer of right calf limited to breakdown of skin: Secondary | ICD-10-CM | POA: Diagnosis not present

## 2023-10-23 DIAGNOSIS — L97821 Non-pressure chronic ulcer of other part of left lower leg limited to breakdown of skin: Secondary | ICD-10-CM | POA: Diagnosis not present

## 2023-10-23 DIAGNOSIS — I872 Venous insufficiency (chronic) (peripheral): Secondary | ICD-10-CM | POA: Diagnosis not present

## 2023-10-23 DIAGNOSIS — J9601 Acute respiratory failure with hypoxia: Secondary | ICD-10-CM | POA: Diagnosis not present

## 2023-10-23 DIAGNOSIS — E1121 Type 2 diabetes mellitus with diabetic nephropathy: Secondary | ICD-10-CM | POA: Diagnosis not present

## 2023-10-26 DIAGNOSIS — L97821 Non-pressure chronic ulcer of other part of left lower leg limited to breakdown of skin: Secondary | ICD-10-CM | POA: Diagnosis not present

## 2023-10-26 DIAGNOSIS — E1121 Type 2 diabetes mellitus with diabetic nephropathy: Secondary | ICD-10-CM | POA: Diagnosis not present

## 2023-10-26 DIAGNOSIS — I110 Hypertensive heart disease with heart failure: Secondary | ICD-10-CM | POA: Diagnosis not present

## 2023-10-26 DIAGNOSIS — L97211 Non-pressure chronic ulcer of right calf limited to breakdown of skin: Secondary | ICD-10-CM | POA: Diagnosis not present

## 2023-10-26 DIAGNOSIS — J9601 Acute respiratory failure with hypoxia: Secondary | ICD-10-CM | POA: Diagnosis not present

## 2023-10-26 DIAGNOSIS — I872 Venous insufficiency (chronic) (peripheral): Secondary | ICD-10-CM | POA: Diagnosis not present

## 2023-10-29 ENCOUNTER — Ambulatory Visit (INDEPENDENT_AMBULATORY_CARE_PROVIDER_SITE_OTHER): Payer: Medicare Other | Admitting: Orthopedic Surgery

## 2023-10-29 DIAGNOSIS — I83019 Varicose veins of right lower extremity with ulcer of unspecified site: Secondary | ICD-10-CM

## 2023-10-29 DIAGNOSIS — L97919 Non-pressure chronic ulcer of unspecified part of right lower leg with unspecified severity: Secondary | ICD-10-CM

## 2023-11-04 DIAGNOSIS — E662 Morbid (severe) obesity with alveolar hypoventilation: Secondary | ICD-10-CM | POA: Diagnosis not present

## 2023-11-04 DIAGNOSIS — I5033 Acute on chronic diastolic (congestive) heart failure: Secondary | ICD-10-CM | POA: Diagnosis not present

## 2023-11-04 DIAGNOSIS — E8729 Other acidosis: Secondary | ICD-10-CM | POA: Diagnosis not present

## 2023-11-04 DIAGNOSIS — Z77090 Contact with and (suspected) exposure to asbestos: Secondary | ICD-10-CM | POA: Diagnosis not present

## 2023-11-04 DIAGNOSIS — J9621 Acute and chronic respiratory failure with hypoxia: Secondary | ICD-10-CM | POA: Diagnosis not present

## 2023-11-04 DIAGNOSIS — I21A1 Myocardial infarction type 2: Secondary | ICD-10-CM | POA: Diagnosis not present

## 2023-11-04 DIAGNOSIS — I509 Heart failure, unspecified: Secondary | ICD-10-CM | POA: Diagnosis not present

## 2023-11-04 DIAGNOSIS — Z79899 Other long term (current) drug therapy: Secondary | ICD-10-CM | POA: Diagnosis not present

## 2023-11-04 DIAGNOSIS — J441 Chronic obstructive pulmonary disease with (acute) exacerbation: Secondary | ICD-10-CM | POA: Diagnosis not present

## 2023-11-04 DIAGNOSIS — E785 Hyperlipidemia, unspecified: Secondary | ICD-10-CM | POA: Diagnosis not present

## 2023-11-04 DIAGNOSIS — R0989 Other specified symptoms and signs involving the circulatory and respiratory systems: Secondary | ICD-10-CM | POA: Diagnosis not present

## 2023-11-04 DIAGNOSIS — Z7902 Long term (current) use of antithrombotics/antiplatelets: Secondary | ICD-10-CM | POA: Diagnosis not present

## 2023-11-04 DIAGNOSIS — R079 Chest pain, unspecified: Secondary | ICD-10-CM | POA: Diagnosis not present

## 2023-11-04 DIAGNOSIS — I5021 Acute systolic (congestive) heart failure: Secondary | ICD-10-CM | POA: Diagnosis not present

## 2023-11-04 DIAGNOSIS — Z7982 Long term (current) use of aspirin: Secondary | ICD-10-CM | POA: Diagnosis not present

## 2023-11-04 DIAGNOSIS — Z6841 Body Mass Index (BMI) 40.0 and over, adult: Secondary | ICD-10-CM | POA: Diagnosis not present

## 2023-11-04 DIAGNOSIS — G9349 Other encephalopathy: Secondary | ICD-10-CM | POA: Diagnosis not present

## 2023-11-04 DIAGNOSIS — Z7984 Long term (current) use of oral hypoglycemic drugs: Secondary | ICD-10-CM | POA: Diagnosis not present

## 2023-11-04 DIAGNOSIS — N179 Acute kidney failure, unspecified: Secondary | ICD-10-CM | POA: Diagnosis not present

## 2023-11-04 DIAGNOSIS — J9622 Acute and chronic respiratory failure with hypercapnia: Secondary | ICD-10-CM | POA: Diagnosis not present

## 2023-11-04 DIAGNOSIS — Z89512 Acquired absence of left leg below knee: Secondary | ICD-10-CM | POA: Diagnosis not present

## 2023-11-04 DIAGNOSIS — E1151 Type 2 diabetes mellitus with diabetic peripheral angiopathy without gangrene: Secondary | ICD-10-CM | POA: Diagnosis not present

## 2023-11-04 DIAGNOSIS — R0602 Shortness of breath: Secondary | ICD-10-CM | POA: Diagnosis not present

## 2023-11-04 DIAGNOSIS — Z8673 Personal history of transient ischemic attack (TIA), and cerebral infarction without residual deficits: Secondary | ICD-10-CM | POA: Diagnosis not present

## 2023-11-04 DIAGNOSIS — J984 Other disorders of lung: Secondary | ICD-10-CM | POA: Diagnosis not present

## 2023-11-04 DIAGNOSIS — I110 Hypertensive heart disease with heart failure: Secondary | ICD-10-CM | POA: Diagnosis not present

## 2023-11-04 DIAGNOSIS — R918 Other nonspecific abnormal finding of lung field: Secondary | ICD-10-CM | POA: Diagnosis not present

## 2023-11-04 DIAGNOSIS — I252 Old myocardial infarction: Secondary | ICD-10-CM | POA: Diagnosis not present

## 2023-11-05 ENCOUNTER — Encounter: Payer: Self-pay | Admitting: Orthopedic Surgery

## 2023-11-05 NOTE — Progress Notes (Signed)
Office Visit Note   Patient: [name] [name]           Date of Birth: [birth year]           MRN: [id number] Visit Date: 10/29/2023              Requested by: Sasser, Clarene Critchley, MD [address] [place],  [place] [address] PCP: Estanislado Pandy, MD  Chief Complaint  Patient presents with   Right Leg - Wound Check, Follow-up      HPI: Patient is a 68 year old gentleman who presents in follow-up for right lower extremity venous and lymphatic insufficiency ulceration.  Patient currently has home health nursing wraps twice a week.  Assessment & Plan: Visit Diagnoses:  1. Venous ulcer of right leg (HCC)     Plan: Patient continues to show slow steady improvement.  Continue with home health dressing changes twice a week.  Follow-Up Instructions: Return in about 4 weeks (around 11/26/2023).   Ortho Exam  Patient is alert, oriented, no adenopathy, well-dressed, normal affect, normal respiratory effort. Patient ambulates in a motorized wheelchair with his feet dependent.  There is massive swelling of the toes foot and ankle.  The ulcers in the right leg continue to improve there is no cellulitis.  Imaging: No results found.     Labs: Lab Results  Component Value Date   HGBA1C 7.3 (H) 10/17/2021   HGBA1C 6.5 (H) 09/12/2019   CRP 17.6 (H) 09/14/2019   CRP 14.7 (H) 09/13/2019   CRP 14.6 (H) 09/12/2019   REPTSTATUS 11/20/2021 FINAL 11/17/2021   CULT (A) 11/17/2021    >=100,000 COLONIES/mL ESCHERICHIA COLI >=100,000 COLONIES/mL KLEBSIELLA OXYTOCA    LABORGA ESCHERICHIA COLI (A) 11/17/2021   LABORGA KLEBSIELLA OXYTOCA (A) 11/17/2021     Lab Results  Component Value Date   ALBUMIN 2.6 (L) 11/10/2021   ALBUMIN 1.8 (L) 10/25/2021   ALBUMIN 2.5 (L) 10/14/2021    Lab Results  Component Value Date   MG 2.1 09/14/2019   MG 2.3 09/13/2019   MG 1.9 09/12/2019   No results found for: "VD25OH"  No results found for: "PREALBUMIN"    Latest Ref Rng & Units 12/21/2021     9:53 AM 11/10/2021    6:54 AM 10/31/2021    5:00 AM  CBC EXTENDED  WBC 4.0 - 10.5 K/uL 7.6  6.5  7.9   RBC 4.22 - 5.81 Mil/uL 4.57  3.88  3.22   Hemoglobin 13.0 - 17.0 g/dL 19.1  9.5  7.8   HCT 47.8 - 52.0 % 35.6  33.0  26.5   Platelets 150.0 - 400.0 K/uL 230.0  176  243   NEUT# 1.4 - 7.7 K/uL 5.3  4.3    Lymph# 0.7 - 4.0 K/uL 0.9  0.8       There is no height or weight on file to calculate BMI.  Orders:  No orders of the defined types were placed in this encounter.  No orders of the defined types were placed in this encounter.    Procedures: No procedures performed  Clinical Data: No additional findings.  ROS:  All other systems negative, except as noted in the HPI. Review of Systems  Objective: Vital Signs: There were no vitals taken for this visit.  Specialty Comments:  No specialty comments available.  PMFS History: Patient Active Problem List   Diagnosis Date Noted   Acute on chronic diastolic (congestive) heart failure (HCC)    Controlled type 2  diabetes mellitus with hyperglycemia, without long-term current use of insulin (HCC)    Phantom limb pain (HCC)    Left below-knee amputee (HCC) 10/25/2021   Left leg pain    Osteomyelitis of ankle and foot (HCC) 10/15/2021   Anemia 10/15/2021   Pressure ulcer 10/15/2021   History of CVA (cerebrovascular accident) 10/15/2021   Septic arthritis of ankle and foot region (HCC) 10/15/2021   AKI (acute kidney injury) (HCC) 10/14/2021   Pneumonia due to COVID-19 virus 09/09/2019   Hypokalemia 09/09/2019   Acute respiratory failure with hypoxia (HCC) 09/09/2019   Essential hypertension 10/14/2014   Chronic diastolic heart failure (HCC) 10/14/2014   Morbid obesity (HCC) 07/22/2013   Varicose veins of bilateral lower extremities with other complications 07/22/2013   OSA (obstructive sleep apnea) 07/22/2013   Venous stasis ulcers (HCC) 07/22/2013   Diabetes mellitus (HCC) 01/03/2011   Past Medical History:  Diagnosis  Date   Adenomatous colon polyp    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HCC)    Diverticulosis    Essential hypertension    Gastritis    History of nephrolithiasis    History of open leg wound    History of stroke 03/19/2003   Lymphedema    Obstructive sleep apnea    Peptic ulcer    TIA (transient ischemic attack)    Varicose veins    Laser ablation of left great saphenous vein 2009    Family History  Problem Relation Age of Onset   Bone cancer Mother    Hypertension Father    Pancreatic cancer Maternal Grandmother    Stroke Paternal Grandmother    Diabetes Paternal Grandfather    Heart attack Brother    Hypertension Brother    Heart attack Sister     Past Surgical History:  Procedure Laterality Date   AMPUTATION Left 10/21/2021   Procedure: LEFT BELOW KNEE AMPUTATION;  Surgeon: Nadara Mustard, MD;  Location: [facility];  Service: Orthopedics;  Laterality: Left;   BIOPSY  10/19/2021   Procedure: BIOPSY;  Surgeon: Imogene Burn, MD;  Location: [facility] ENDOSCOPY;  Service: Gastroenterology;;   COLONOSCOPY N/A 10/19/2021   Procedure: COLONOSCOPY;  Surgeon: Imogene Burn, MD;  Location: [facility] ENDOSCOPY;  Service: Gastroenterology;  Laterality: N/A;   CYSTOSCOPY WITH RETROGRADE PYELOGRAM, URETEROSCOPY AND STENT PLACEMENT Right 08/02/2015   Procedure: CYSTOSCOPY WITH RETROGRADE PYELOGRAM, URETEROSCOPY , STONE EXTRACTION AND STENT PLACEMENT;  Surgeon: Malen Gauze, MD;  Location: [facility];  Service: Urology;  Laterality: Right;   ESOPHAGOGASTRODUODENOSCOPY (EGD) WITH PROPOFOL N/A 10/19/2021   Procedure: ESOPHAGOGASTRODUODENOSCOPY (EGD) WITH PROPOFOL;  Surgeon: Imogene Burn, MD;  Location: [facility] ENDOSCOPY;  Service: Gastroenterology;  Laterality: N/A;   HOLMIUM LASER APPLICATION Right 08/02/2015   Procedure: HOLMIUM LASER APPLICATION;  Surgeon: Malen Gauze, MD;  Location: [facility];  Service: Urology;  Laterality: Right;   KIDNEY STONE SURGERY     POLYPECTOMY  10/19/2021   Procedure: POLYPECTOMY;  Surgeon: Imogene Burn, MD;  Location: [facility] ENDOSCOPY;  Service: Gastroenterology;;   Gabriela Eves  1984   VEIN SURGERY Left 06/2010   Laser ablation   Social History   Occupational History   Occupation: controll room operator     Employer: [employer]  Tobacco Use   Smoking status: Never    Passive exposure: Never   Smokeless tobacco: Never  Vaping Use   Vaping status: Never Used  Substance and Sexual Activity   Alcohol use: No    Alcohol/week: 0.0 standard drinks of alcohol   Drug  use: No   Sexual activity: Not on file

## 2023-11-09 ENCOUNTER — Telehealth: Payer: Self-pay | Admitting: Orthopedic Surgery

## 2023-11-09 DIAGNOSIS — I509 Heart failure, unspecified: Secondary | ICD-10-CM | POA: Diagnosis not present

## 2023-11-09 DIAGNOSIS — I110 Hypertensive heart disease with heart failure: Secondary | ICD-10-CM | POA: Diagnosis not present

## 2023-11-09 NOTE — Telephone Encounter (Signed)
Faxed order to resume HHRN for wound care for right lower leg. Faxed to [phone]

## 2023-11-09 NOTE — Telephone Encounter (Signed)
Pt's spouse Alona Bene states that she was told by [facility]  in [place], that they need a new Rx for home health wound care.     Pt's call back number [phone]

## 2023-11-14 DIAGNOSIS — Z1331 Encounter for screening for depression: Secondary | ICD-10-CM | POA: Diagnosis not present

## 2023-11-14 DIAGNOSIS — R5383 Other fatigue: Secondary | ICD-10-CM | POA: Diagnosis not present

## 2023-11-14 DIAGNOSIS — Z13220 Encounter for screening for lipoid disorders: Secondary | ICD-10-CM | POA: Diagnosis not present

## 2023-11-14 DIAGNOSIS — Z136 Encounter for screening for cardiovascular disorders: Secondary | ICD-10-CM | POA: Diagnosis not present

## 2023-11-14 DIAGNOSIS — I509 Heart failure, unspecified: Secondary | ICD-10-CM | POA: Diagnosis not present

## 2023-11-14 DIAGNOSIS — Z1339 Encounter for screening examination for other mental health and behavioral disorders: Secondary | ICD-10-CM | POA: Diagnosis not present

## 2023-11-14 DIAGNOSIS — I110 Hypertensive heart disease with heart failure: Secondary | ICD-10-CM | POA: Diagnosis not present

## 2023-11-14 DIAGNOSIS — Z1211 Encounter for screening for malignant neoplasm of colon: Secondary | ICD-10-CM | POA: Diagnosis not present

## 2023-11-14 DIAGNOSIS — Z131 Encounter for screening for diabetes mellitus: Secondary | ICD-10-CM | POA: Diagnosis not present

## 2023-11-14 DIAGNOSIS — Z8673 Personal history of transient ischemic attack (TIA), and cerebral infarction without residual deficits: Secondary | ICD-10-CM | POA: Diagnosis not present

## 2023-11-14 DIAGNOSIS — Z0000 Encounter for general adult medical examination without abnormal findings: Secondary | ICD-10-CM | POA: Diagnosis not present

## 2023-11-19 DIAGNOSIS — H40053 Ocular hypertension, bilateral: Secondary | ICD-10-CM | POA: Diagnosis not present

## 2023-11-21 DIAGNOSIS — E785 Hyperlipidemia, unspecified: Secondary | ICD-10-CM | POA: Diagnosis not present

## 2023-11-21 DIAGNOSIS — Z6841 Body Mass Index (BMI) 40.0 and over, adult: Secondary | ICD-10-CM | POA: Diagnosis not present

## 2023-11-21 DIAGNOSIS — I110 Hypertensive heart disease with heart failure: Secondary | ICD-10-CM | POA: Diagnosis not present

## 2023-11-21 DIAGNOSIS — I5022 Chronic systolic (congestive) heart failure: Secondary | ICD-10-CM | POA: Diagnosis not present

## 2023-11-26 ENCOUNTER — Ambulatory Visit (INDEPENDENT_AMBULATORY_CARE_PROVIDER_SITE_OTHER): Payer: Medicare Other | Admitting: Orthopedic Surgery

## 2023-11-26 ENCOUNTER — Encounter: Payer: Self-pay | Admitting: Orthopedic Surgery

## 2023-11-26 DIAGNOSIS — I83019 Varicose veins of right lower extremity with ulcer of unspecified site: Secondary | ICD-10-CM

## 2023-11-26 DIAGNOSIS — L97919 Non-pressure chronic ulcer of unspecified part of right lower leg with unspecified severity: Secondary | ICD-10-CM

## 2023-11-26 DIAGNOSIS — Z89512 Acquired absence of left leg below knee: Secondary | ICD-10-CM | POA: Diagnosis not present

## 2023-11-26 NOTE — Progress Notes (Signed)
 Office Visit Note   Patient: [name] [name]           Date of Birth: [birth year]           MRN: [id number] Visit Date: 11/26/2023              Requested by: Sasser, Clarene Critchley, MD [address] [place],  [place] [address] PCP: Estanislado Pandy, MD  Chief Complaint  Patient presents with   Right Leg - Wound Check      HPI: Patient is a 68 year old gentleman who presents in follow-up for venous lymphatic insufficiency right lower extremity.  Patient states that recently he was in [place] and was admitted for congestive heart failure.  Patient has not been followed up with home health nursing since his return to home.  Assessment & Plan: Visit Diagnoses:  1. Venous ulcer of right leg (HCC)   2. Left below-knee amputee [facility])     Plan: Will place orders to resume home health compression wraps to the right lower extremity twice a week.  Follow-Up Instructions: Return in about 4 weeks (around 12/24/2023).   Ortho Exam  Patient is alert, oriented, no adenopathy, well-dressed, normal affect, normal respiratory effort. Examination the right lower extremity is stable there is slight increased swelling but no increased ulceration no cellulitis.  Patient's most swelling is still in the foot.  Imaging: No results found.     Labs: Lab Results  Component Value Date   HGBA1C 7.3 (H) 10/17/2021   HGBA1C 6.5 (H) 09/12/2019   CRP 17.6 (H) 09/14/2019   CRP 14.7 (H) 09/13/2019   CRP 14.6 (H) 09/12/2019   REPTSTATUS 11/20/2021 FINAL 11/17/2021   CULT (A) 11/17/2021    >=100,000 COLONIES/mL ESCHERICHIA COLI >=100,000 COLONIES/mL KLEBSIELLA OXYTOCA    LABORGA ESCHERICHIA COLI (A) 11/17/2021   LABORGA KLEBSIELLA OXYTOCA (A) 11/17/2021     Lab Results  Component Value Date   ALBUMIN 2.6 (L) 11/10/2021   ALBUMIN 1.8 (L) 10/25/2021   ALBUMIN 2.5 (L) 10/14/2021    Lab Results  Component Value Date   MG 2.1 09/14/2019   MG 2.3 09/13/2019   MG 1.9 09/12/2019   No results found  for: "VD25OH"  No results found for: "PREALBUMIN"    Latest Ref Rng & Units 12/21/2021    9:53 AM 11/10/2021    6:54 AM 10/31/2021    5:00 AM  CBC EXTENDED  WBC 4.0 - 10.5 K/uL 7.6  6.5  7.9   RBC 4.22 - 5.81 Mil/uL 4.57  3.88  3.22   Hemoglobin 13.0 - 17.0 g/dL 19.1  9.5  7.8   HCT 47.8 - 52.0 % 35.6  33.0  26.5   Platelets 150.0 - 400.0 K/uL 230.0  176  243   NEUT# 1.4 - 7.7 K/uL 5.3  4.3    Lymph# 0.7 - 4.0 K/uL 0.9  0.8       There is no height or weight on file to calculate BMI.  Orders:  No orders of the defined types were placed in this encounter.  No orders of the defined types were placed in this encounter.    Procedures: No procedures performed  Clinical Data: No additional findings.  ROS:  All other systems negative, except as noted in the HPI. Review of Systems  Objective: Vital Signs: There were no vitals taken for this visit.  Specialty Comments:  No specialty comments available.  PMFS History: Patient Active Problem List   Diagnosis Date  Noted   Acute on chronic diastolic (congestive) heart failure (HCC)    Controlled type 2 diabetes mellitus with hyperglycemia, without long-term current use of insulin (HCC)    Phantom limb pain (HCC)    Left below-knee amputee (HCC) 10/25/2021   Left leg pain    Osteomyelitis of ankle and foot (HCC) 10/15/2021   Anemia 10/15/2021   Pressure ulcer 10/15/2021   History of CVA (cerebrovascular accident) 10/15/2021   Septic arthritis of ankle and foot region (HCC) 10/15/2021   AKI (acute kidney injury) (HCC) 10/14/2021   Pneumonia due to COVID-19 virus 09/09/2019   Hypokalemia 09/09/2019   Acute respiratory failure with hypoxia (HCC) 09/09/2019   Essential hypertension 10/14/2014   Chronic diastolic heart failure (HCC) 10/14/2014   Morbid obesity (HCC) 07/22/2013   Varicose veins of bilateral lower extremities with other complications 07/22/2013   OSA (obstructive sleep apnea) 07/22/2013   Venous stasis ulcers  (HCC) 07/22/2013   Diabetes mellitus (HCC) 01/03/2011   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HCC)    Diverticulosis    Essential hypertension    Gastritis    History of nephrolithiasis    History of open leg wound    History of stroke 03/19/2003   Lymphedema    Obstructive sleep apnea    Peptic ulcer    TIA (transient ischemic attack)    Varicose veins    Laser ablation of left great saphenous vein 2009    Family History  Problem Relation Age of Onset   Bone cancer Mother    Hypertension Father    Pancreatic cancer Maternal Grandmother    Stroke Paternal Grandmother    Diabetes Paternal Grandfather    Heart attack Brother    Hypertension Brother    Heart attack Sister     Past Surgical History:  Procedure Laterality Date   AMPUTATION Left 10/21/2021   Procedure: LEFT BELOW KNEE AMPUTATION;  Surgeon: Nadara Mustard, MD;  Location: [facility];  Service: Orthopedics;  Laterality: Left;   BIOPSY  10/19/2021   Procedure: BIOPSY;  Surgeon: Imogene Burn, MD;  Location[facility] END[facility]COPY;  Service: Gastroenterology;;   COLONOSCOPY N/A 10/19/2021   Procedure: COLONOSCOPY;  Surgeon: Imogene Burn, MD;  [facility] ENDOSCOPY;  Service: Gastroenterology;  Laterality: N/A;   CYSTOSCOPY WITH RETROGRADE PYELOGRAM, URETEROSCOPY AND STENT PLACEMENT Right 08/02/2015   Procedure: CYSTOSCOPY WITH RETROGRADE PYELOGRAM, URETEROSCOPY , STONE EXTRACTION AND STENT PLACEMENT;  Surgeon: Malen Gauze, MD;  Location: [facility];  Service: Urology;  Laterality: Right;   ESOPHAGOGASTRODUODENOSCOPY (EGD) WITH PROPOFOL N/A 10/19/2021   Procedure: ESOPHAGOGASTRODUODENOSCOPY (EGD) WITH PROPOFOL;  Surgeon: Imogene Burn, MD;  Location: [facility] ENDOSCOPY;  Service: Gastroenterology;  Laterality: N/A;   HOLMIUM LASER APPLICATION Right 08/02/2015   Procedure: HOLMIUM LASER APPLICATION;  Surgeon: Malen Gauze, MD;  [facility];  Service: Urology;  Laterality: Right;   KIDNEY  STONE SURGERY     POLYPECTOMY  10/19/2021   Procedure: POLYPECTOMY;  Surgeon: Imogene Burn, MD;  Location: [facility] ENDOSCOPY;  Service: Gastroenterology;;   Gabriela Eves  1984   VEIN SURGERY Left 06/2010   Laser ablation   Social History   Occupational History   Occupation: controll room operator     Employer: [employer]  Tobacco Use   Smoking status: Never    Passive exposure: Never   Smokeless tobacco: Never  Vaping Use   Vaping status: Never Used  Substance and Sexual Activity  Alcohol use: No    Alcohol/week: 0.0 standard drinks of alcohol   Drug use: No   Sexual activity: Not on file

## 2023-11-30 DIAGNOSIS — E1151 Type 2 diabetes mellitus with diabetic peripheral angiopathy without gangrene: Secondary | ICD-10-CM | POA: Diagnosis not present

## 2023-11-30 DIAGNOSIS — Z7902 Long term (current) use of antithrombotics/antiplatelets: Secondary | ICD-10-CM | POA: Diagnosis not present

## 2023-11-30 DIAGNOSIS — Z7982 Long term (current) use of aspirin: Secondary | ICD-10-CM | POA: Diagnosis not present

## 2023-11-30 DIAGNOSIS — Z8673 Personal history of transient ischemic attack (TIA), and cerebral infarction without residual deficits: Secondary | ICD-10-CM | POA: Diagnosis not present

## 2023-11-30 DIAGNOSIS — Z6841 Body Mass Index (BMI) 40.0 and over, adult: Secondary | ICD-10-CM | POA: Diagnosis not present

## 2023-11-30 DIAGNOSIS — Z860101 Personal history of adenomatous and serrated colon polyps: Secondary | ICD-10-CM | POA: Diagnosis not present

## 2023-11-30 DIAGNOSIS — I110 Hypertensive heart disease with heart failure: Secondary | ICD-10-CM | POA: Diagnosis not present

## 2023-11-30 DIAGNOSIS — I872 Venous insufficiency (chronic) (peripheral): Secondary | ICD-10-CM | POA: Diagnosis not present

## 2023-11-30 DIAGNOSIS — I5033 Acute on chronic diastolic (congestive) heart failure: Secondary | ICD-10-CM | POA: Diagnosis not present

## 2023-11-30 DIAGNOSIS — I890 Lymphedema, not elsewhere classified: Secondary | ICD-10-CM | POA: Diagnosis not present

## 2023-11-30 DIAGNOSIS — D649 Anemia, unspecified: Secondary | ICD-10-CM | POA: Diagnosis not present

## 2023-11-30 DIAGNOSIS — L97811 Non-pressure chronic ulcer of other part of right lower leg limited to breakdown of skin: Secondary | ICD-10-CM | POA: Diagnosis not present

## 2023-11-30 DIAGNOSIS — G4733 Obstructive sleep apnea (adult) (pediatric): Secondary | ICD-10-CM | POA: Diagnosis not present

## 2023-11-30 DIAGNOSIS — K5790 Diverticulosis of intestine, part unspecified, without perforation or abscess without bleeding: Secondary | ICD-10-CM | POA: Diagnosis not present

## 2023-11-30 DIAGNOSIS — Z7984 Long term (current) use of oral hypoglycemic drugs: Secondary | ICD-10-CM | POA: Diagnosis not present

## 2023-11-30 DIAGNOSIS — Z8616 Personal history of COVID-19: Secondary | ICD-10-CM | POA: Diagnosis not present

## 2023-11-30 DIAGNOSIS — Z8701 Personal history of pneumonia (recurrent): Secondary | ICD-10-CM | POA: Diagnosis not present

## 2023-11-30 DIAGNOSIS — Z89512 Acquired absence of left leg below knee: Secondary | ICD-10-CM | POA: Diagnosis not present

## 2023-11-30 DIAGNOSIS — Z9981 Dependence on supplemental oxygen: Secondary | ICD-10-CM | POA: Diagnosis not present

## 2023-12-01 ENCOUNTER — Other Ambulatory Visit: Payer: Self-pay | Admitting: Cardiology

## 2023-12-04 DIAGNOSIS — I5033 Acute on chronic diastolic (congestive) heart failure: Secondary | ICD-10-CM | POA: Diagnosis not present

## 2023-12-04 DIAGNOSIS — I890 Lymphedema, not elsewhere classified: Secondary | ICD-10-CM | POA: Diagnosis not present

## 2023-12-04 DIAGNOSIS — L97811 Non-pressure chronic ulcer of other part of right lower leg limited to breakdown of skin: Secondary | ICD-10-CM | POA: Diagnosis not present

## 2023-12-04 DIAGNOSIS — I872 Venous insufficiency (chronic) (peripheral): Secondary | ICD-10-CM | POA: Diagnosis not present

## 2023-12-04 DIAGNOSIS — I110 Hypertensive heart disease with heart failure: Secondary | ICD-10-CM | POA: Diagnosis not present

## 2023-12-04 DIAGNOSIS — E1151 Type 2 diabetes mellitus with diabetic peripheral angiopathy without gangrene: Secondary | ICD-10-CM | POA: Diagnosis not present

## 2023-12-06 DIAGNOSIS — G4733 Obstructive sleep apnea (adult) (pediatric): Secondary | ICD-10-CM | POA: Diagnosis not present

## 2023-12-07 DIAGNOSIS — I890 Lymphedema, not elsewhere classified: Secondary | ICD-10-CM | POA: Diagnosis not present

## 2023-12-07 DIAGNOSIS — L97811 Non-pressure chronic ulcer of other part of right lower leg limited to breakdown of skin: Secondary | ICD-10-CM | POA: Diagnosis not present

## 2023-12-07 DIAGNOSIS — E1151 Type 2 diabetes mellitus with diabetic peripheral angiopathy without gangrene: Secondary | ICD-10-CM | POA: Diagnosis not present

## 2023-12-07 DIAGNOSIS — I872 Venous insufficiency (chronic) (peripheral): Secondary | ICD-10-CM | POA: Diagnosis not present

## 2023-12-07 DIAGNOSIS — I5033 Acute on chronic diastolic (congestive) heart failure: Secondary | ICD-10-CM | POA: Diagnosis not present

## 2023-12-07 DIAGNOSIS — I110 Hypertensive heart disease with heart failure: Secondary | ICD-10-CM | POA: Diagnosis not present

## 2023-12-11 DIAGNOSIS — I110 Hypertensive heart disease with heart failure: Secondary | ICD-10-CM | POA: Diagnosis not present

## 2023-12-11 DIAGNOSIS — I5033 Acute on chronic diastolic (congestive) heart failure: Secondary | ICD-10-CM | POA: Diagnosis not present

## 2023-12-11 DIAGNOSIS — I890 Lymphedema, not elsewhere classified: Secondary | ICD-10-CM | POA: Diagnosis not present

## 2023-12-11 DIAGNOSIS — I872 Venous insufficiency (chronic) (peripheral): Secondary | ICD-10-CM | POA: Diagnosis not present

## 2023-12-11 DIAGNOSIS — L97811 Non-pressure chronic ulcer of other part of right lower leg limited to breakdown of skin: Secondary | ICD-10-CM | POA: Diagnosis not present

## 2023-12-11 DIAGNOSIS — E1151 Type 2 diabetes mellitus with diabetic peripheral angiopathy without gangrene: Secondary | ICD-10-CM | POA: Diagnosis not present

## 2023-12-13 DIAGNOSIS — I110 Hypertensive heart disease with heart failure: Secondary | ICD-10-CM | POA: Diagnosis not present

## 2023-12-13 DIAGNOSIS — I509 Heart failure, unspecified: Secondary | ICD-10-CM | POA: Diagnosis not present

## 2023-12-14 DIAGNOSIS — I890 Lymphedema, not elsewhere classified: Secondary | ICD-10-CM | POA: Diagnosis not present

## 2023-12-14 DIAGNOSIS — I872 Venous insufficiency (chronic) (peripheral): Secondary | ICD-10-CM | POA: Diagnosis not present

## 2023-12-14 DIAGNOSIS — I110 Hypertensive heart disease with heart failure: Secondary | ICD-10-CM | POA: Diagnosis not present

## 2023-12-14 DIAGNOSIS — L97811 Non-pressure chronic ulcer of other part of right lower leg limited to breakdown of skin: Secondary | ICD-10-CM | POA: Diagnosis not present

## 2023-12-14 DIAGNOSIS — E1151 Type 2 diabetes mellitus with diabetic peripheral angiopathy without gangrene: Secondary | ICD-10-CM | POA: Diagnosis not present

## 2023-12-14 DIAGNOSIS — I5033 Acute on chronic diastolic (congestive) heart failure: Secondary | ICD-10-CM | POA: Diagnosis not present

## 2023-12-18 DIAGNOSIS — L97811 Non-pressure chronic ulcer of other part of right lower leg limited to breakdown of skin: Secondary | ICD-10-CM | POA: Diagnosis not present

## 2023-12-18 DIAGNOSIS — E1151 Type 2 diabetes mellitus with diabetic peripheral angiopathy without gangrene: Secondary | ICD-10-CM | POA: Diagnosis not present

## 2023-12-18 DIAGNOSIS — I872 Venous insufficiency (chronic) (peripheral): Secondary | ICD-10-CM | POA: Diagnosis not present

## 2023-12-18 DIAGNOSIS — I5033 Acute on chronic diastolic (congestive) heart failure: Secondary | ICD-10-CM | POA: Diagnosis not present

## 2023-12-18 DIAGNOSIS — I110 Hypertensive heart disease with heart failure: Secondary | ICD-10-CM | POA: Diagnosis not present

## 2023-12-18 DIAGNOSIS — I890 Lymphedema, not elsewhere classified: Secondary | ICD-10-CM | POA: Diagnosis not present

## 2023-12-21 DIAGNOSIS — L97811 Non-pressure chronic ulcer of other part of right lower leg limited to breakdown of skin: Secondary | ICD-10-CM | POA: Diagnosis not present

## 2023-12-21 DIAGNOSIS — I872 Venous insufficiency (chronic) (peripheral): Secondary | ICD-10-CM | POA: Diagnosis not present

## 2023-12-21 DIAGNOSIS — I5033 Acute on chronic diastolic (congestive) heart failure: Secondary | ICD-10-CM | POA: Diagnosis not present

## 2023-12-21 DIAGNOSIS — I110 Hypertensive heart disease with heart failure: Secondary | ICD-10-CM | POA: Diagnosis not present

## 2023-12-21 DIAGNOSIS — I890 Lymphedema, not elsewhere classified: Secondary | ICD-10-CM | POA: Diagnosis not present

## 2023-12-21 DIAGNOSIS — E1151 Type 2 diabetes mellitus with diabetic peripheral angiopathy without gangrene: Secondary | ICD-10-CM | POA: Diagnosis not present

## 2023-12-24 ENCOUNTER — Encounter: Payer: Self-pay | Admitting: Orthopedic Surgery

## 2023-12-24 ENCOUNTER — Ambulatory Visit: Admitting: Orthopedic Surgery

## 2023-12-24 DIAGNOSIS — I83019 Varicose veins of right lower extremity with ulcer of unspecified site: Secondary | ICD-10-CM | POA: Diagnosis not present

## 2023-12-24 DIAGNOSIS — I890 Lymphedema, not elsewhere classified: Secondary | ICD-10-CM

## 2023-12-24 DIAGNOSIS — L97919 Non-pressure chronic ulcer of unspecified part of right lower leg with unspecified severity: Secondary | ICD-10-CM

## 2023-12-24 DIAGNOSIS — Z89512 Acquired absence of left leg below knee: Secondary | ICD-10-CM

## 2023-12-24 NOTE — Progress Notes (Signed)
 Office Visit Note   Patient: [name] [name]           Date of Birth: [birth year]           MRN: [id number] Visit Date: 12/24/2023              Requested by: Sasser, Clarene Critchley, MD [address] [place],  [place] [address] PCP: Estanislado Pandy, MD  Chief Complaint  Patient presents with   Right Leg - Wound Check      HPI: Patient is a 68 year old gentleman who is seen in follow-up for venous and lymphatic insufficiency ulceration right lower extremity currently undergoing home health dressing changes twice a week.  Patient recently struck the foot on a door frame.  Patient states he has a sleep study tomorrow.  Is on nasal cannula oxygen 24 hours a day and recent O2 saturations in the 80s.  Assessment & Plan: Visit Diagnoses:  1. Venous ulcer of right leg (HCC)   2. Left below-knee amputee (HCC)   3. Lymphedema     Plan: Recommend they change the gauze between the toes daily.  Continue compression wraps.  Patient is going to be traveling and they will follow-up in 5 weeks.  Follow-Up Instructions: No follow-ups on file.   Ortho Exam  Patient is alert, oriented, no adenopathy, well-dressed, normal affect, normal respiratory effort. Examination there is increased swelling around the forefoot.  There is increased maceration.  Recommended change the gauze between the toes daily.  The leg shows decreased swelling and improved healthy granulation tissue.  There is a little bit of redness proximal to the wrap most likely secondary to the swelling.  Imaging: No results found.      Labs: Lab Results  Component Value Date   HGBA1C 7.3 (H) 10/17/2021   HGBA1C 6.5 (H) 09/12/2019   CRP 17.6 (H) 09/14/2019   CRP 14.7 (H) 09/13/2019   CRP 14.6 (H) 09/12/2019   REPTSTATUS 11/20/2021 FINAL 11/17/2021   CULT (A) 11/17/2021    >=100,000 COLONIES/mL ESCHERICHIA COLI >=100,000 COLONIES/mL KLEBSIELLA OXYTOCA    LABORGA ESCHERICHIA COLI (A) 11/17/2021   LABORGA KLEBSIELLA OXYTOCA  (A) 11/17/2021     Lab Results  Component Value Date   ALBUMIN 2.6 (L) 11/10/2021   ALBUMIN 1.8 (L) 10/25/2021   ALBUMIN 2.5 (L) 10/14/2021    Lab Results  Component Value Date   MG 2.1 09/14/2019   MG 2.3 09/13/2019   MG 1.9 09/12/2019   No results found for: "VD25OH"  No results found for: "PREALBUMIN"    Latest Ref Rng & Units 12/21/2021    9:53 AM 11/10/2021    6:54 AM 10/31/2021    5:00 AM  CBC EXTENDED  WBC 4.0 - 10.5 K/uL 7.6  6.5  7.9   RBC 4.22 - 5.81 Mil/uL 4.57  3.88  3.22   Hemoglobin 13.0 - 17.0 g/dL 62.1  9.5  7.8   HCT 30.8 - 52.0 % 35.6  33.0  26.5   Platelets 150.0 - 400.0 K/uL 230.0  176  243   NEUT# 1.4 - 7.7 K/uL 5.3  4.3    Lymph# 0.7 - 4.0 K/uL 0.9  0.8       There is no height or weight on file to calculate BMI.  Orders:  No orders of the defined types were placed in this encounter.  No orders of the defined types were placed in this encounter.    Procedures: No procedures performed  Clinical Data: No additional findings.  ROS:  All other systems negative, except as noted in the HPI. Review of Systems  Objective: Vital Signs: There were no vitals taken for this visit.  Specialty Comments:  No specialty comments available.  PMFS History: Patient Active Problem List   Diagnosis Date Noted   Acute on chronic diastolic (congestive) heart failure (HCC)    Controlled type 2 diabetes mellitus with hyperglycemia, without long-term current use of insulin (HCC)    Phantom limb pain (HCC)    Left below-knee amputee (HCC) 10/25/2021   Left leg pain    Osteomyelitis of ankle and foot (HCC) 10/15/2021   Anemia 10/15/2021   Pressure ulcer 10/15/2021   History of CVA (cerebrovascular accident) 10/15/2021   Septic arthritis of ankle and foot region (HCC) 10/15/2021   AKI (acute kidney injury) (HCC) 10/14/2021   Pneumonia due to COVID-19 virus 09/09/2019   Hypokalemia 09/09/2019   Acute respiratory failure with hypoxia (HCC) 09/09/2019    Essential hypertension 10/14/2014   Chronic diastolic heart failure (HCC) 10/14/2014   Morbid obesity (HCC) 07/22/2013   Varicose veins of bilateral lower extremities with other complications 07/22/2013   OSA (obstructive sleep apnea) 07/22/2013   Venous stasis ulcers (HCC) 07/22/2013   Diabetes mellitus (HCC) 01/03/2011   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HCC)    Diverticulosis    Essential hypertension    Gastritis    History of nephrolithiasis    History of open leg wound    History of stroke 03/19/2003   Lymphedema    Obstructive sleep apnea    Peptic ulcer    TIA (transient ischemic attack)    Varicose veins    Laser ablation of left great saphenous vein 2009    Family History  Problem Relation Age of Onset   Bone cancer Mother    Hypertension Father    Pancreatic cancer Maternal Grandmother    Stroke Paternal Grandmother    Diabetes Paternal Grandfather    Heart attack Brother    Hypertension Brother    Heart attack Sister     Past Surgical History:  Procedure Laterality Date   AMPUTATION Left 10/21/2021   Procedure: LEFT BELOW KNEE AMPUTATION;  Surgeon: Nadara Mustard, MD;  Location: [facility];  Service: Orthopedics;  Laterality: Left;   BIOPSY  10/19/2021   Procedure: BIOPSY;  Surgeon: Imogene Burn, MD;  Location: [facility] ENDOSCOPY;  Service: Gastroenterology;;   COLONOSCOPY N/A 10/19/2021   Procedure: COLONOSCOPY;  Surgeon: Imogene Burn, MD;  Location: [facility] ENDOSCOPY;  Service: Gastroenterology;  Laterality: N/A;   CYSTOSCOPY WITH RETROGRADE PYELOGRAM, URETEROSCOPY AND STENT PLACEMENT Right 08/02/2015   Procedure: CYSTOSCOPY WITH RETROGRADE PYELOGRAM, URETEROSCOPY , STONE EXTRACTION AND STENT PLACEMENT;  Surgeon: Malen Gauze, MD;  [facility];  Service: Urology;  Laterality: Right;   ESOPHAGOGASTRODUODENOSCOPY (EGD) WITH PROPOFOL N/A 10/19/2021   Procedure: ESOPHAGOGASTRODUODENOSCOPY (EGD) WITH PROPOFOL;  Surgeon:  Imogene Burn, MD;  Location: [facility] ENDOSCOPY;  Service: Gastroenterology;  Laterality: N/A;   HOLMIUM LASER APPLICATION Right 08/02/2015   Procedure: HOLMIUM LASER APPLICATION;  Surgeon: Malen Gauze, MD;  [facility]: [facility];  Service: Urology;  Laterality: Right;   KIDNEY STONE SURGERY     POLYPECTOMY  10/19/2021   Procedure: POLYPECTOMY;  Surgeon: Imogene Burn, MD;  Location: [facility] ENDOSCOPY;  Service: Gastroenterology;;   VASECTOMY  1984   VEIN SURGERY Left 06/2010   Laser ablation  Social History   Occupational History   Occupation: Dealer: [employer]  Tobacco Use   Smoking status: Never    Passive exposure: Never   Smokeless tobacco: Never  Vaping Use   Vaping status: Never Used  Substance and Sexual Activity   Alcohol use: No    Alcohol/week: 0.0 standard drinks of alcohol   Drug use: No   Sexual activity: Not on file

## 2023-12-26 DIAGNOSIS — G4733 Obstructive sleep apnea (adult) (pediatric): Secondary | ICD-10-CM | POA: Diagnosis not present

## 2023-12-28 DIAGNOSIS — I5033 Acute on chronic diastolic (congestive) heart failure: Secondary | ICD-10-CM | POA: Diagnosis not present

## 2023-12-28 DIAGNOSIS — L97811 Non-pressure chronic ulcer of other part of right lower leg limited to breakdown of skin: Secondary | ICD-10-CM | POA: Diagnosis not present

## 2023-12-28 DIAGNOSIS — I872 Venous insufficiency (chronic) (peripheral): Secondary | ICD-10-CM | POA: Diagnosis not present

## 2023-12-28 DIAGNOSIS — I110 Hypertensive heart disease with heart failure: Secondary | ICD-10-CM | POA: Diagnosis not present

## 2023-12-28 DIAGNOSIS — I890 Lymphedema, not elsewhere classified: Secondary | ICD-10-CM | POA: Diagnosis not present

## 2023-12-28 DIAGNOSIS — E1151 Type 2 diabetes mellitus with diabetic peripheral angiopathy without gangrene: Secondary | ICD-10-CM | POA: Diagnosis not present

## 2023-12-30 DIAGNOSIS — Z8673 Personal history of transient ischemic attack (TIA), and cerebral infarction without residual deficits: Secondary | ICD-10-CM | POA: Diagnosis not present

## 2023-12-30 DIAGNOSIS — I890 Lymphedema, not elsewhere classified: Secondary | ICD-10-CM | POA: Diagnosis not present

## 2023-12-30 DIAGNOSIS — G4733 Obstructive sleep apnea (adult) (pediatric): Secondary | ICD-10-CM | POA: Diagnosis not present

## 2023-12-30 DIAGNOSIS — Z9981 Dependence on supplemental oxygen: Secondary | ICD-10-CM | POA: Diagnosis not present

## 2023-12-30 DIAGNOSIS — Z860101 Personal history of adenomatous and serrated colon polyps: Secondary | ICD-10-CM | POA: Diagnosis not present

## 2023-12-30 DIAGNOSIS — Z8701 Personal history of pneumonia (recurrent): Secondary | ICD-10-CM | POA: Diagnosis not present

## 2023-12-30 DIAGNOSIS — Z6841 Body Mass Index (BMI) 40.0 and over, adult: Secondary | ICD-10-CM | POA: Diagnosis not present

## 2023-12-30 DIAGNOSIS — Z7982 Long term (current) use of aspirin: Secondary | ICD-10-CM | POA: Diagnosis not present

## 2023-12-30 DIAGNOSIS — Z7902 Long term (current) use of antithrombotics/antiplatelets: Secondary | ICD-10-CM | POA: Diagnosis not present

## 2023-12-30 DIAGNOSIS — K5790 Diverticulosis of intestine, part unspecified, without perforation or abscess without bleeding: Secondary | ICD-10-CM | POA: Diagnosis not present

## 2023-12-30 DIAGNOSIS — Z7984 Long term (current) use of oral hypoglycemic drugs: Secondary | ICD-10-CM | POA: Diagnosis not present

## 2023-12-30 DIAGNOSIS — I872 Venous insufficiency (chronic) (peripheral): Secondary | ICD-10-CM | POA: Diagnosis not present

## 2023-12-30 DIAGNOSIS — Z8616 Personal history of COVID-19: Secondary | ICD-10-CM | POA: Diagnosis not present

## 2023-12-30 DIAGNOSIS — I5033 Acute on chronic diastolic (congestive) heart failure: Secondary | ICD-10-CM | POA: Diagnosis not present

## 2023-12-30 DIAGNOSIS — E1151 Type 2 diabetes mellitus with diabetic peripheral angiopathy without gangrene: Secondary | ICD-10-CM | POA: Diagnosis not present

## 2023-12-30 DIAGNOSIS — Z89512 Acquired absence of left leg below knee: Secondary | ICD-10-CM | POA: Diagnosis not present

## 2023-12-30 DIAGNOSIS — L97811 Non-pressure chronic ulcer of other part of right lower leg limited to breakdown of skin: Secondary | ICD-10-CM | POA: Diagnosis not present

## 2023-12-30 DIAGNOSIS — I110 Hypertensive heart disease with heart failure: Secondary | ICD-10-CM | POA: Diagnosis not present

## 2023-12-30 DIAGNOSIS — D649 Anemia, unspecified: Secondary | ICD-10-CM | POA: Diagnosis not present

## 2024-01-02 DIAGNOSIS — I5033 Acute on chronic diastolic (congestive) heart failure: Secondary | ICD-10-CM | POA: Diagnosis not present

## 2024-01-02 DIAGNOSIS — I890 Lymphedema, not elsewhere classified: Secondary | ICD-10-CM | POA: Diagnosis not present

## 2024-01-02 DIAGNOSIS — I872 Venous insufficiency (chronic) (peripheral): Secondary | ICD-10-CM | POA: Diagnosis not present

## 2024-01-02 DIAGNOSIS — L97811 Non-pressure chronic ulcer of other part of right lower leg limited to breakdown of skin: Secondary | ICD-10-CM | POA: Diagnosis not present

## 2024-01-02 DIAGNOSIS — E1151 Type 2 diabetes mellitus with diabetic peripheral angiopathy without gangrene: Secondary | ICD-10-CM | POA: Diagnosis not present

## 2024-01-02 DIAGNOSIS — I110 Hypertensive heart disease with heart failure: Secondary | ICD-10-CM | POA: Diagnosis not present

## 2024-01-04 DIAGNOSIS — I890 Lymphedema, not elsewhere classified: Secondary | ICD-10-CM | POA: Diagnosis not present

## 2024-01-04 DIAGNOSIS — E1151 Type 2 diabetes mellitus with diabetic peripheral angiopathy without gangrene: Secondary | ICD-10-CM | POA: Diagnosis not present

## 2024-01-04 DIAGNOSIS — I872 Venous insufficiency (chronic) (peripheral): Secondary | ICD-10-CM | POA: Diagnosis not present

## 2024-01-04 DIAGNOSIS — L97811 Non-pressure chronic ulcer of other part of right lower leg limited to breakdown of skin: Secondary | ICD-10-CM | POA: Diagnosis not present

## 2024-01-04 DIAGNOSIS — I110 Hypertensive heart disease with heart failure: Secondary | ICD-10-CM | POA: Diagnosis not present

## 2024-01-04 DIAGNOSIS — I5033 Acute on chronic diastolic (congestive) heart failure: Secondary | ICD-10-CM | POA: Diagnosis not present

## 2024-01-10 DIAGNOSIS — I110 Hypertensive heart disease with heart failure: Secondary | ICD-10-CM | POA: Diagnosis not present

## 2024-01-10 DIAGNOSIS — I509 Heart failure, unspecified: Secondary | ICD-10-CM | POA: Diagnosis not present

## 2024-01-11 DIAGNOSIS — I5033 Acute on chronic diastolic (congestive) heart failure: Secondary | ICD-10-CM | POA: Diagnosis not present

## 2024-01-11 DIAGNOSIS — L97811 Non-pressure chronic ulcer of other part of right lower leg limited to breakdown of skin: Secondary | ICD-10-CM | POA: Diagnosis not present

## 2024-01-11 DIAGNOSIS — E1151 Type 2 diabetes mellitus with diabetic peripheral angiopathy without gangrene: Secondary | ICD-10-CM | POA: Diagnosis not present

## 2024-01-11 DIAGNOSIS — I890 Lymphedema, not elsewhere classified: Secondary | ICD-10-CM | POA: Diagnosis not present

## 2024-01-11 DIAGNOSIS — I872 Venous insufficiency (chronic) (peripheral): Secondary | ICD-10-CM | POA: Diagnosis not present

## 2024-01-11 DIAGNOSIS — I110 Hypertensive heart disease with heart failure: Secondary | ICD-10-CM | POA: Diagnosis not present

## 2024-01-15 DIAGNOSIS — L97811 Non-pressure chronic ulcer of other part of right lower leg limited to breakdown of skin: Secondary | ICD-10-CM | POA: Diagnosis not present

## 2024-01-15 DIAGNOSIS — I890 Lymphedema, not elsewhere classified: Secondary | ICD-10-CM | POA: Diagnosis not present

## 2024-01-15 DIAGNOSIS — I110 Hypertensive heart disease with heart failure: Secondary | ICD-10-CM | POA: Diagnosis not present

## 2024-01-15 DIAGNOSIS — E1151 Type 2 diabetes mellitus with diabetic peripheral angiopathy without gangrene: Secondary | ICD-10-CM | POA: Diagnosis not present

## 2024-01-15 DIAGNOSIS — I5033 Acute on chronic diastolic (congestive) heart failure: Secondary | ICD-10-CM | POA: Diagnosis not present

## 2024-01-15 DIAGNOSIS — I872 Venous insufficiency (chronic) (peripheral): Secondary | ICD-10-CM | POA: Diagnosis not present

## 2024-01-18 DIAGNOSIS — L97811 Non-pressure chronic ulcer of other part of right lower leg limited to breakdown of skin: Secondary | ICD-10-CM | POA: Diagnosis not present

## 2024-01-18 DIAGNOSIS — E1151 Type 2 diabetes mellitus with diabetic peripheral angiopathy without gangrene: Secondary | ICD-10-CM | POA: Diagnosis not present

## 2024-01-18 DIAGNOSIS — I890 Lymphedema, not elsewhere classified: Secondary | ICD-10-CM | POA: Diagnosis not present

## 2024-01-18 DIAGNOSIS — I872 Venous insufficiency (chronic) (peripheral): Secondary | ICD-10-CM | POA: Diagnosis not present

## 2024-01-18 DIAGNOSIS — I110 Hypertensive heart disease with heart failure: Secondary | ICD-10-CM | POA: Diagnosis not present

## 2024-01-18 DIAGNOSIS — I5033 Acute on chronic diastolic (congestive) heart failure: Secondary | ICD-10-CM | POA: Diagnosis not present

## 2024-01-25 DIAGNOSIS — L97811 Non-pressure chronic ulcer of other part of right lower leg limited to breakdown of skin: Secondary | ICD-10-CM | POA: Diagnosis not present

## 2024-01-25 DIAGNOSIS — I5033 Acute on chronic diastolic (congestive) heart failure: Secondary | ICD-10-CM | POA: Diagnosis not present

## 2024-01-25 DIAGNOSIS — E1151 Type 2 diabetes mellitus with diabetic peripheral angiopathy without gangrene: Secondary | ICD-10-CM | POA: Diagnosis not present

## 2024-01-25 DIAGNOSIS — I890 Lymphedema, not elsewhere classified: Secondary | ICD-10-CM | POA: Diagnosis not present

## 2024-01-25 DIAGNOSIS — I872 Venous insufficiency (chronic) (peripheral): Secondary | ICD-10-CM | POA: Diagnosis not present

## 2024-01-25 DIAGNOSIS — I110 Hypertensive heart disease with heart failure: Secondary | ICD-10-CM | POA: Diagnosis not present

## 2024-01-28 ENCOUNTER — Ambulatory Visit (INDEPENDENT_AMBULATORY_CARE_PROVIDER_SITE_OTHER): Admitting: Orthopedic Surgery

## 2024-01-28 ENCOUNTER — Telehealth: Payer: Self-pay | Admitting: Orthopedic Surgery

## 2024-01-28 DIAGNOSIS — Z89512 Acquired absence of left leg below knee: Secondary | ICD-10-CM

## 2024-01-28 DIAGNOSIS — I83019 Varicose veins of right lower extremity with ulcer of unspecified site: Secondary | ICD-10-CM

## 2024-01-28 DIAGNOSIS — I890 Lymphedema, not elsewhere classified: Secondary | ICD-10-CM

## 2024-01-28 DIAGNOSIS — L97919 Non-pressure chronic ulcer of unspecified part of right lower leg with unspecified severity: Secondary | ICD-10-CM | POA: Diagnosis not present

## 2024-01-28 NOTE — Telephone Encounter (Signed)
 Pt has an appt today. Will hold message and return call after appt.

## 2024-01-28 NOTE — Telephone Encounter (Signed)
 [name] [name] from [facility] called and  said that the wound on the left leg is too wet. Can they try the Optocel Ag. CB# [phone]

## 2024-01-29 ENCOUNTER — Encounter: Payer: Self-pay | Admitting: Orthopedic Surgery

## 2024-01-29 DIAGNOSIS — Z8616 Personal history of COVID-19: Secondary | ICD-10-CM | POA: Diagnosis not present

## 2024-01-29 DIAGNOSIS — I110 Hypertensive heart disease with heart failure: Secondary | ICD-10-CM | POA: Diagnosis not present

## 2024-01-29 DIAGNOSIS — Z9981 Dependence on supplemental oxygen: Secondary | ICD-10-CM | POA: Diagnosis not present

## 2024-01-29 DIAGNOSIS — K5790 Diverticulosis of intestine, part unspecified, without perforation or abscess without bleeding: Secondary | ICD-10-CM | POA: Diagnosis not present

## 2024-01-29 DIAGNOSIS — I890 Lymphedema, not elsewhere classified: Secondary | ICD-10-CM | POA: Diagnosis not present

## 2024-01-29 DIAGNOSIS — Z8701 Personal history of pneumonia (recurrent): Secondary | ICD-10-CM | POA: Diagnosis not present

## 2024-01-29 DIAGNOSIS — G4733 Obstructive sleep apnea (adult) (pediatric): Secondary | ICD-10-CM | POA: Diagnosis not present

## 2024-01-29 DIAGNOSIS — D649 Anemia, unspecified: Secondary | ICD-10-CM | POA: Diagnosis not present

## 2024-01-29 DIAGNOSIS — Z860101 Personal history of adenomatous and serrated colon polyps: Secondary | ICD-10-CM | POA: Diagnosis not present

## 2024-01-29 DIAGNOSIS — I872 Venous insufficiency (chronic) (peripheral): Secondary | ICD-10-CM | POA: Diagnosis not present

## 2024-01-29 DIAGNOSIS — E1151 Type 2 diabetes mellitus with diabetic peripheral angiopathy without gangrene: Secondary | ICD-10-CM | POA: Diagnosis not present

## 2024-01-29 DIAGNOSIS — Z7982 Long term (current) use of aspirin: Secondary | ICD-10-CM | POA: Diagnosis not present

## 2024-01-29 DIAGNOSIS — Z7902 Long term (current) use of antithrombotics/antiplatelets: Secondary | ICD-10-CM | POA: Diagnosis not present

## 2024-01-29 DIAGNOSIS — Z89512 Acquired absence of left leg below knee: Secondary | ICD-10-CM | POA: Diagnosis not present

## 2024-01-29 DIAGNOSIS — Z6841 Body Mass Index (BMI) 40.0 and over, adult: Secondary | ICD-10-CM | POA: Diagnosis not present

## 2024-01-29 DIAGNOSIS — I5033 Acute on chronic diastolic (congestive) heart failure: Secondary | ICD-10-CM | POA: Diagnosis not present

## 2024-01-29 DIAGNOSIS — Z7984 Long term (current) use of oral hypoglycemic drugs: Secondary | ICD-10-CM | POA: Diagnosis not present

## 2024-01-29 DIAGNOSIS — Z8673 Personal history of transient ischemic attack (TIA), and cerebral infarction without residual deficits: Secondary | ICD-10-CM | POA: Diagnosis not present

## 2024-01-29 DIAGNOSIS — L97811 Non-pressure chronic ulcer of other part of right lower leg limited to breakdown of skin: Secondary | ICD-10-CM | POA: Diagnosis not present

## 2024-01-29 NOTE — Progress Notes (Signed)
 Office Visit Note   Patient: [name] [name]           Date of Birth: 10-29-1955           MRN: [id number] Visit Date: 01/28/2024              Requested by: Sasser, Ky Phillips, MD [address] [place],  [place] [address] PCP: Orest Bio, MD  Chief Complaint  Patient presents with   Right Leg - Wound Check      HPI: Patient is seen in follow-up for venous and lymphatic insufficiency right lower extremity currently undergoing compression wraps 2 times a week.  Patient also has a broken however chair that will not elevate.  Patient is requiring a Nurse, adult for transfers.  Assessment & Plan: Visit Diagnoses:  1. Venous ulcer of right leg (HCC)   2. Left below-knee amputee (HCC)   3. Lymphedema     Plan: A prescription was provided for a [employer] lift.  His power chair is also broken with the height adjustment and will need a new chair or current chair refreshed.  Continue with compression wraps to the right lower extremity.  Follow-Up Instructions: Return in about 4 weeks (around 02/25/2024).   Ortho Exam  Patient is alert, oriented, no adenopathy, well-dressed, normal affect, normal respiratory effort. Examination patient has multiple venous and lymphatic ulcers in the right leg however there is decreased swelling no cellulitis no signs of infection.  He still has swelling in the forefoot secondary to the leg being dependent.  No ulcers on the left lower extremity.  Imaging: No results found. No images are attached to the encounter.  Labs: Lab Results  Component Value Date   HGBA1C 7.3 (H) 10/17/2021   HGBA1C 6.5 (H) 09/12/2019   CRP 17.6 (H) 09/14/2019   CRP 14.7 (H) 09/13/2019   CRP 14.6 (H) 09/12/2019   REPTSTATUS 11/20/2021 FINAL 11/17/2021   CULT (A) 11/17/2021    >=100,000 COLONIES/mL ESCHERICHIA COLI >=100,000 COLONIES/mL KLEBSIELLA OXYTOCA    LABORGA ESCHERICHIA COLI (A) 11/17/2021   LABORGA KLEBSIELLA OXYTOCA (A) 11/17/2021     Lab Results  Component  Value Date   ALBUMIN 2.6 (L) 11/10/2021   ALBUMIN 1.8 (L) 10/25/2021   ALBUMIN 2.5 (L) 10/14/2021    Lab Results  Component Value Date   MG 2.1 09/14/2019   MG 2.3 09/13/2019   MG 1.9 09/12/2019   No results found for: "VD25OH"  No results found for: "PREALBUMIN"    Latest Ref Rng & Units 12/21/2021    9:53 AM 11/10/2021    6:54 AM 10/31/2021    5:00 AM  CBC EXTENDED  WBC 4.0 - 10.5 K/uL 7.6  6.5  7.9   RBC 4.22 - 5.81 Mil/uL 4.57  3.88  3.22   Hemoglobin 13.0 - 17.0 g/dL 10.6  9.5  7.8   HCT 26.9 - 52.0 % 35.6  33.0  26.5   Platelets 150.0 - 400.0 K/uL 230.0  176  243   NEUT# 1.4 - 7.7 K/uL 5.3  4.3    Lymph# 0.7 - 4.0 K/uL 0.9  0.8       There is no height or weight on file to calculate BMI.  Orders:  No orders of the defined types were placed in this encounter.  No orders of the defined types were placed in this encounter.    Procedures: No procedures performed  Clinical Data: No additional findings.  ROS:  All other  systems negative, except as noted in the HPI. Review of Systems  Objective: Vital Signs: There were no vitals taken for this visit.  Specialty Comments:  No specialty comments available.  PMFS History: Patient Active Problem List   Diagnosis Date Noted   Acute on chronic diastolic (congestive) heart failure (HCC)    Controlled type 2 diabetes mellitus with hyperglycemia, without long-term current use of insulin  (HCC)    Phantom limb pain (HCC)    Left below-knee amputee (HCC) 10/25/2021   Left leg pain    Osteomyelitis of ankle and foot (HCC) 10/15/2021   Anemia 10/15/2021   Pressure ulcer 10/15/2021   History of CVA (cerebrovascular accident) 10/15/2021   Septic arthritis of ankle and foot region (HCC) 10/15/2021   AKI (acute kidney injury) (HCC) 10/14/2021   Pneumonia due to COVID-19 virus 09/09/2019   Hypokalemia 09/09/2019   Acute respiratory failure with hypoxia (HCC) 09/09/2019   Essential hypertension 10/14/2014   Chronic  diastolic heart failure (HCC) 10/14/2014   Morbid obesity (HCC) 07/22/2013   Varicose veins of bilateral lower extremities with other complications 07/22/2013   OSA (obstructive sleep apnea) 07/22/2013   Venous stasis ulcers (HCC) 07/22/2013   Diabetes mellitus (HCC) 01/03/2011   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HCC)    Diverticulosis    Essential hypertension    Gastritis    History of nephrolithiasis    History of open leg wound    History of stroke 03/19/2003   Lymphedema    Obstructive sleep apnea    Peptic ulcer    TIA (transient ischemic attack)    Varicose veins    Laser ablation of left great saphenous vein 2009    Family History  Problem Relation Age of Onset   Bone cancer Mother    Hypertension Father    Pancreatic cancer Maternal Grandmother    Stroke Paternal Grandmother    Diabetes Paternal Grandfather    Heart attack Brother    Hypertension Brother    Heart attack Sister     Past Surgical History:  Procedure Laterality Date   AMPUTATION Left 10/21/2021   Procedure: LEFT BELOW KNEE AMPUTATION;  Surgeon: Timothy Ford, MD;  Location: [facility];  Service: Orthopedics;  Laterality: Left;   BIOPSY  10/19/2021   Procedure: BIOPSY;  Surgeon: Daina Drum, MD;  Location: [facility] ENDOSCOPY;  Service: Gastroenterology;;   COLONOSCOPY N/A 10/19/2021   Procedure: COLONOSCOPY;  Surgeon: Daina Drum, MD;  Location: [facility] ENDOSCOPY;  Service: Gastroenterology;  Laterality: N/A;   CYSTOSCOPY WITH RETROGRADE PYELOGRAM, URETEROSCOPY AND STENT PLACEMENT Right 08/02/2015   Procedure: CYSTOSCOPY WITH RETROGRADE PYELOGRAM, URETEROSCOPY , STONE EXTRACTION AND STENT PLACEMENT;  Surgeon: Marco Severs, MD;  [facility];  Service: Urology;  Laterality: Right;   ESOPHAGOGASTRODUODENOSCOPY (EGD) WITH PROPOFOL  N/A 10/19/2021   Procedure: ESOPHAGOGASTRODUODENOSCOPY (EGD) WITH PROPOFOL ;  Surgeon: Daina Drum, MD;  Location[facility] ENDOSCOPY;   Service: Gastroenterology;  Laterality: N/A;   HOLMIUM LASER APPLICATION Right 08/02/2015   Procedure: HOLMIUM LASER APPLICATION;  Surgeon: Marco Severs, MD;  Location: [facility];  Service: Urology;  Laterality: Right;   KIDNEY STONE SURGERY     POLYPECTOMY  10/19/2021   Procedure: POLYPECTOMY;  Surgeon: Daina Drum, MD;  Location: [facility] ENDOSCOPY;  Service: Gastroenterology;;   VASECTOMY  1984   VEIN SURGERY Left 06/2010   Laser ablation   Social History   Occupational History   Occupation: controll  room operator     Employer: [employer]  Tobacco Use   Smoking status: Never    Passive exposure: Never   Smokeless tobacco: Never  Vaping Use   Vaping status: Never Used  Substance and Sexual Activity   Alcohol  use: No    Alcohol /week: 0.0 standard drinks of alcohol    Drug use: No   Sexual activity: Not on file

## 2024-01-31 NOTE — Telephone Encounter (Signed)
 I called and lm on vm to advise verbal ok for order as requested below. Continue with his HHN dressing changes twice a week and call with any questions.

## 2024-02-01 DIAGNOSIS — I110 Hypertensive heart disease with heart failure: Secondary | ICD-10-CM | POA: Diagnosis not present

## 2024-02-01 DIAGNOSIS — E1151 Type 2 diabetes mellitus with diabetic peripheral angiopathy without gangrene: Secondary | ICD-10-CM | POA: Diagnosis not present

## 2024-02-01 DIAGNOSIS — I872 Venous insufficiency (chronic) (peripheral): Secondary | ICD-10-CM | POA: Diagnosis not present

## 2024-02-01 DIAGNOSIS — L97811 Non-pressure chronic ulcer of other part of right lower leg limited to breakdown of skin: Secondary | ICD-10-CM | POA: Diagnosis not present

## 2024-02-01 DIAGNOSIS — I5033 Acute on chronic diastolic (congestive) heart failure: Secondary | ICD-10-CM | POA: Diagnosis not present

## 2024-02-01 DIAGNOSIS — I890 Lymphedema, not elsewhere classified: Secondary | ICD-10-CM | POA: Diagnosis not present

## 2024-02-05 DIAGNOSIS — E1151 Type 2 diabetes mellitus with diabetic peripheral angiopathy without gangrene: Secondary | ICD-10-CM | POA: Diagnosis not present

## 2024-02-05 DIAGNOSIS — I5033 Acute on chronic diastolic (congestive) heart failure: Secondary | ICD-10-CM | POA: Diagnosis not present

## 2024-02-05 DIAGNOSIS — L97811 Non-pressure chronic ulcer of other part of right lower leg limited to breakdown of skin: Secondary | ICD-10-CM | POA: Diagnosis not present

## 2024-02-05 DIAGNOSIS — I110 Hypertensive heart disease with heart failure: Secondary | ICD-10-CM | POA: Diagnosis not present

## 2024-02-05 DIAGNOSIS — I890 Lymphedema, not elsewhere classified: Secondary | ICD-10-CM | POA: Diagnosis not present

## 2024-02-05 DIAGNOSIS — I872 Venous insufficiency (chronic) (peripheral): Secondary | ICD-10-CM | POA: Diagnosis not present

## 2024-02-08 DIAGNOSIS — I872 Venous insufficiency (chronic) (peripheral): Secondary | ICD-10-CM | POA: Diagnosis not present

## 2024-02-08 DIAGNOSIS — I890 Lymphedema, not elsewhere classified: Secondary | ICD-10-CM | POA: Diagnosis not present

## 2024-02-08 DIAGNOSIS — I110 Hypertensive heart disease with heart failure: Secondary | ICD-10-CM | POA: Diagnosis not present

## 2024-02-08 DIAGNOSIS — E1151 Type 2 diabetes mellitus with diabetic peripheral angiopathy without gangrene: Secondary | ICD-10-CM | POA: Diagnosis not present

## 2024-02-08 DIAGNOSIS — L97811 Non-pressure chronic ulcer of other part of right lower leg limited to breakdown of skin: Secondary | ICD-10-CM | POA: Diagnosis not present

## 2024-02-08 DIAGNOSIS — I5033 Acute on chronic diastolic (congestive) heart failure: Secondary | ICD-10-CM | POA: Diagnosis not present

## 2024-02-13 DIAGNOSIS — E1151 Type 2 diabetes mellitus with diabetic peripheral angiopathy without gangrene: Secondary | ICD-10-CM | POA: Diagnosis not present

## 2024-02-13 DIAGNOSIS — L97811 Non-pressure chronic ulcer of other part of right lower leg limited to breakdown of skin: Secondary | ICD-10-CM | POA: Diagnosis not present

## 2024-02-13 DIAGNOSIS — I872 Venous insufficiency (chronic) (peripheral): Secondary | ICD-10-CM | POA: Diagnosis not present

## 2024-02-13 DIAGNOSIS — I5033 Acute on chronic diastolic (congestive) heart failure: Secondary | ICD-10-CM | POA: Diagnosis not present

## 2024-02-13 DIAGNOSIS — I890 Lymphedema, not elsewhere classified: Secondary | ICD-10-CM | POA: Diagnosis not present

## 2024-02-13 DIAGNOSIS — I110 Hypertensive heart disease with heart failure: Secondary | ICD-10-CM | POA: Diagnosis not present

## 2024-02-14 DIAGNOSIS — I110 Hypertensive heart disease with heart failure: Secondary | ICD-10-CM | POA: Diagnosis not present

## 2024-02-14 DIAGNOSIS — I509 Heart failure, unspecified: Secondary | ICD-10-CM | POA: Diagnosis not present

## 2024-02-15 DIAGNOSIS — E1151 Type 2 diabetes mellitus with diabetic peripheral angiopathy without gangrene: Secondary | ICD-10-CM | POA: Diagnosis not present

## 2024-02-15 DIAGNOSIS — L97811 Non-pressure chronic ulcer of other part of right lower leg limited to breakdown of skin: Secondary | ICD-10-CM | POA: Diagnosis not present

## 2024-02-15 DIAGNOSIS — I5033 Acute on chronic diastolic (congestive) heart failure: Secondary | ICD-10-CM | POA: Diagnosis not present

## 2024-02-15 DIAGNOSIS — I890 Lymphedema, not elsewhere classified: Secondary | ICD-10-CM | POA: Diagnosis not present

## 2024-02-15 DIAGNOSIS — I872 Venous insufficiency (chronic) (peripheral): Secondary | ICD-10-CM | POA: Diagnosis not present

## 2024-02-15 DIAGNOSIS — I110 Hypertensive heart disease with heart failure: Secondary | ICD-10-CM | POA: Diagnosis not present

## 2024-02-19 DIAGNOSIS — I110 Hypertensive heart disease with heart failure: Secondary | ICD-10-CM | POA: Diagnosis not present

## 2024-02-19 DIAGNOSIS — I872 Venous insufficiency (chronic) (peripheral): Secondary | ICD-10-CM | POA: Diagnosis not present

## 2024-02-19 DIAGNOSIS — I5033 Acute on chronic diastolic (congestive) heart failure: Secondary | ICD-10-CM | POA: Diagnosis not present

## 2024-02-19 DIAGNOSIS — I890 Lymphedema, not elsewhere classified: Secondary | ICD-10-CM | POA: Diagnosis not present

## 2024-02-19 DIAGNOSIS — L97811 Non-pressure chronic ulcer of other part of right lower leg limited to breakdown of skin: Secondary | ICD-10-CM | POA: Diagnosis not present

## 2024-02-19 DIAGNOSIS — E1151 Type 2 diabetes mellitus with diabetic peripheral angiopathy without gangrene: Secondary | ICD-10-CM | POA: Diagnosis not present

## 2024-02-22 DIAGNOSIS — I110 Hypertensive heart disease with heart failure: Secondary | ICD-10-CM | POA: Diagnosis not present

## 2024-02-22 DIAGNOSIS — I872 Venous insufficiency (chronic) (peripheral): Secondary | ICD-10-CM | POA: Diagnosis not present

## 2024-02-22 DIAGNOSIS — I5033 Acute on chronic diastolic (congestive) heart failure: Secondary | ICD-10-CM | POA: Diagnosis not present

## 2024-02-22 DIAGNOSIS — L97811 Non-pressure chronic ulcer of other part of right lower leg limited to breakdown of skin: Secondary | ICD-10-CM | POA: Diagnosis not present

## 2024-02-22 DIAGNOSIS — I890 Lymphedema, not elsewhere classified: Secondary | ICD-10-CM | POA: Diagnosis not present

## 2024-02-22 DIAGNOSIS — E1151 Type 2 diabetes mellitus with diabetic peripheral angiopathy without gangrene: Secondary | ICD-10-CM | POA: Diagnosis not present

## 2024-02-25 ENCOUNTER — Ambulatory Visit: Admitting: Orthopedic Surgery

## 2024-02-26 DIAGNOSIS — E1151 Type 2 diabetes mellitus with diabetic peripheral angiopathy without gangrene: Secondary | ICD-10-CM | POA: Diagnosis not present

## 2024-02-26 DIAGNOSIS — I5033 Acute on chronic diastolic (congestive) heart failure: Secondary | ICD-10-CM | POA: Diagnosis not present

## 2024-02-26 DIAGNOSIS — I890 Lymphedema, not elsewhere classified: Secondary | ICD-10-CM | POA: Diagnosis not present

## 2024-02-26 DIAGNOSIS — I110 Hypertensive heart disease with heart failure: Secondary | ICD-10-CM | POA: Diagnosis not present

## 2024-02-26 DIAGNOSIS — L97811 Non-pressure chronic ulcer of other part of right lower leg limited to breakdown of skin: Secondary | ICD-10-CM | POA: Diagnosis not present

## 2024-02-26 DIAGNOSIS — I872 Venous insufficiency (chronic) (peripheral): Secondary | ICD-10-CM | POA: Diagnosis not present

## 2024-02-27 DIAGNOSIS — I110 Hypertensive heart disease with heart failure: Secondary | ICD-10-CM | POA: Diagnosis not present

## 2024-02-27 DIAGNOSIS — I5033 Acute on chronic diastolic (congestive) heart failure: Secondary | ICD-10-CM | POA: Diagnosis not present

## 2024-02-27 DIAGNOSIS — L97811 Non-pressure chronic ulcer of other part of right lower leg limited to breakdown of skin: Secondary | ICD-10-CM | POA: Diagnosis not present

## 2024-02-27 DIAGNOSIS — I890 Lymphedema, not elsewhere classified: Secondary | ICD-10-CM | POA: Diagnosis not present

## 2024-02-27 DIAGNOSIS — I872 Venous insufficiency (chronic) (peripheral): Secondary | ICD-10-CM | POA: Diagnosis not present

## 2024-02-27 DIAGNOSIS — E1151 Type 2 diabetes mellitus with diabetic peripheral angiopathy without gangrene: Secondary | ICD-10-CM | POA: Diagnosis not present

## 2024-02-28 DIAGNOSIS — I110 Hypertensive heart disease with heart failure: Secondary | ICD-10-CM | POA: Diagnosis not present

## 2024-02-28 DIAGNOSIS — D649 Anemia, unspecified: Secondary | ICD-10-CM | POA: Diagnosis not present

## 2024-02-28 DIAGNOSIS — Z7984 Long term (current) use of oral hypoglycemic drugs: Secondary | ICD-10-CM | POA: Diagnosis not present

## 2024-02-28 DIAGNOSIS — Z7902 Long term (current) use of antithrombotics/antiplatelets: Secondary | ICD-10-CM | POA: Diagnosis not present

## 2024-02-28 DIAGNOSIS — Z7982 Long term (current) use of aspirin: Secondary | ICD-10-CM | POA: Diagnosis not present

## 2024-02-28 DIAGNOSIS — I890 Lymphedema, not elsewhere classified: Secondary | ICD-10-CM | POA: Diagnosis not present

## 2024-02-28 DIAGNOSIS — Z89512 Acquired absence of left leg below knee: Secondary | ICD-10-CM | POA: Diagnosis not present

## 2024-02-28 DIAGNOSIS — Z8701 Personal history of pneumonia (recurrent): Secondary | ICD-10-CM | POA: Diagnosis not present

## 2024-02-28 DIAGNOSIS — Z8673 Personal history of transient ischemic attack (TIA), and cerebral infarction without residual deficits: Secondary | ICD-10-CM | POA: Diagnosis not present

## 2024-02-28 DIAGNOSIS — I5033 Acute on chronic diastolic (congestive) heart failure: Secondary | ICD-10-CM | POA: Diagnosis not present

## 2024-02-28 DIAGNOSIS — Z860101 Personal history of adenomatous and serrated colon polyps: Secondary | ICD-10-CM | POA: Diagnosis not present

## 2024-02-28 DIAGNOSIS — Z8616 Personal history of COVID-19: Secondary | ICD-10-CM | POA: Diagnosis not present

## 2024-02-28 DIAGNOSIS — K5790 Diverticulosis of intestine, part unspecified, without perforation or abscess without bleeding: Secondary | ICD-10-CM | POA: Diagnosis not present

## 2024-02-28 DIAGNOSIS — E1151 Type 2 diabetes mellitus with diabetic peripheral angiopathy without gangrene: Secondary | ICD-10-CM | POA: Diagnosis not present

## 2024-02-28 DIAGNOSIS — G4733 Obstructive sleep apnea (adult) (pediatric): Secondary | ICD-10-CM | POA: Diagnosis not present

## 2024-02-28 DIAGNOSIS — Z9981 Dependence on supplemental oxygen: Secondary | ICD-10-CM | POA: Diagnosis not present

## 2024-02-28 DIAGNOSIS — L97811 Non-pressure chronic ulcer of other part of right lower leg limited to breakdown of skin: Secondary | ICD-10-CM | POA: Diagnosis not present

## 2024-02-28 DIAGNOSIS — I872 Venous insufficiency (chronic) (peripheral): Secondary | ICD-10-CM | POA: Diagnosis not present

## 2024-02-28 DIAGNOSIS — Z6841 Body Mass Index (BMI) 40.0 and over, adult: Secondary | ICD-10-CM | POA: Diagnosis not present

## 2024-02-29 DIAGNOSIS — E1151 Type 2 diabetes mellitus with diabetic peripheral angiopathy without gangrene: Secondary | ICD-10-CM | POA: Diagnosis not present

## 2024-02-29 DIAGNOSIS — I872 Venous insufficiency (chronic) (peripheral): Secondary | ICD-10-CM | POA: Diagnosis not present

## 2024-02-29 DIAGNOSIS — L97811 Non-pressure chronic ulcer of other part of right lower leg limited to breakdown of skin: Secondary | ICD-10-CM | POA: Diagnosis not present

## 2024-02-29 DIAGNOSIS — I110 Hypertensive heart disease with heart failure: Secondary | ICD-10-CM | POA: Diagnosis not present

## 2024-02-29 DIAGNOSIS — I5033 Acute on chronic diastolic (congestive) heart failure: Secondary | ICD-10-CM | POA: Diagnosis not present

## 2024-02-29 DIAGNOSIS — I890 Lymphedema, not elsewhere classified: Secondary | ICD-10-CM | POA: Diagnosis not present

## 2024-03-03 ENCOUNTER — Ambulatory Visit (INDEPENDENT_AMBULATORY_CARE_PROVIDER_SITE_OTHER): Admitting: Orthopedic Surgery

## 2024-03-03 ENCOUNTER — Encounter: Payer: Self-pay | Admitting: Orthopedic Surgery

## 2024-03-03 DIAGNOSIS — Z89512 Acquired absence of left leg below knee: Secondary | ICD-10-CM | POA: Diagnosis not present

## 2024-03-03 DIAGNOSIS — I83019 Varicose veins of right lower extremity with ulcer of unspecified site: Secondary | ICD-10-CM

## 2024-03-03 DIAGNOSIS — I890 Lymphedema, not elsewhere classified: Secondary | ICD-10-CM

## 2024-03-03 DIAGNOSIS — L97919 Non-pressure chronic ulcer of unspecified part of right lower leg with unspecified severity: Secondary | ICD-10-CM

## 2024-03-03 NOTE — Progress Notes (Unsigned)
 Office Visit Note   Patient: [name] [name]           Date of Birth: 17-Jun-1956           MRN: [id number] Visit Date: 03/03/2024              Requested by: Sasser, Ky Phillips, MD [address] [place],  [place] [address] PCP: Orest Bio, MD  Chief Complaint  Patient presents with   Right Leg - Follow-up      HPI: Patient is seen in follow-up for venous and lymphatic insufficiency right lower extremity currently undergoing compression wraps 2 times a week. Patient also has a broken however chair that will not elevate. Patient is requiring a Nurse, adult for transfers.    Assessment & Plan: Visit Diagnoses:  1. Venous ulcer of right leg (HCC)   2. Left below-knee amputee (HCC)   3. Lymphedema     Plan: Left BKA compression dry dressing left and Continue with compression wraps to the right lower extremity.   Follow-Up Instructions: Return in about 4 weeks (around 03/31/2024).   Ortho Exam  Patient is alert, oriented, no adenopathy, well-dressed, normal affect, normal respiratory effort. Increased edema right foot.  Left posterior BKA superficial skin abrasion. Lymphatic drainage with edema B LE.  Patient has increased skin dermatitis from weeping edema from the increased lymphatic drainage from his leg being dependent at all times.  Patient is on Lasix  40 mg 3 times a day.    Imaging: No results found.     Labs: Lab Results  Component Value Date   HGBA1C 7.3 (H) 10/17/2021   HGBA1C 6.5 (H) 09/12/2019   CRP 17.6 (H) 09/14/2019   CRP 14.7 (H) 09/13/2019   CRP 14.6 (H) 09/12/2019   REPTSTATUS 11/20/2021 FINAL 11/17/2021   CULT (A) 11/17/2021    >=100,000 COLONIES/mL ESCHERICHIA COLI >=100,000 COLONIES/mL KLEBSIELLA OXYTOCA    LABORGA ESCHERICHIA COLI (A) 11/17/2021   LABORGA KLEBSIELLA OXYTOCA (A) 11/17/2021     Lab Results  Component Value Date   ALBUMIN 2.6 (L) 11/10/2021   ALBUMIN 1.8 (L) 10/25/2021   ALBUMIN 2.5 (L) 10/14/2021    Lab Results   Component Value Date   MG 2.1 09/14/2019   MG 2.3 09/13/2019   MG 1.9 09/12/2019   No results found for: VD25OH  No results found for: PREALBUMIN    Latest Ref Rng & Units 12/21/2021    9:53 AM 11/10/2021    6:54 AM 10/31/2021    5:00 AM  CBC EXTENDED  WBC 4.0 - 10.5 K/uL 7.6  6.5  7.9   RBC 4.22 - 5.81 Mil/uL 4.57  3.88  3.22   Hemoglobin 13.0 - 17.0 g/dL 56.4  9.5  7.8   HCT 33.2 - 52.0 % 35.6  33.0  26.5   Platelets 150.0 - 400.0 K/uL 230.0  176  243   NEUT# 1.4 - 7.7 K/uL 5.3  4.3    Lymph# 0.7 - 4.0 K/uL 0.9  0.8       There is no height or weight on file to calculate BMI.  Orders:  No orders of the defined types were placed in this encounter.  No orders of the defined types were placed in this encounter.    Procedures: No procedures performed  Clinical Data: No additional findings.  ROS:  All other systems negative, except as noted in the HPI. Review of Systems  Objective: Vital Signs: There were no vitals taken  for this visit.  Specialty Comments:  No specialty comments available.  PMFS History: Patient Active Problem List   Diagnosis Date Noted   Acute on chronic diastolic (congestive) heart failure (HCC)    Controlled type 2 diabetes mellitus with hyperglycemia, without long-term current use of insulin  (HCC)    Phantom limb pain (HCC)    Left below-knee amputee (HCC) 10/25/2021   Left leg pain    Osteomyelitis of ankle and foot (HCC) 10/15/2021   Anemia 10/15/2021   Pressure ulcer 10/15/2021   History of CVA (cerebrovascular accident) 10/15/2021   Septic arthritis of ankle and foot region (HCC) 10/15/2021   AKI (acute kidney injury) (HCC) 10/14/2021   Pneumonia due to COVID-19 virus 09/09/2019   Hypokalemia 09/09/2019   Acute respiratory failure with hypoxia (HCC) 09/09/2019   Essential hypertension 10/14/2014   Chronic diastolic heart failure (HCC) 10/14/2014   Morbid obesity (HCC) 07/22/2013   Varicose veins of bilateral lower  extremities with other complications 07/22/2013   OSA (obstructive sleep apnea) 07/22/2013   Venous stasis ulcers (HCC) 07/22/2013   Diabetes mellitus (HCC) 01/03/2011   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HCC)    Diverticulosis    Essential hypertension    Gastritis    History of nephrolithiasis    History of open leg wound    History of stroke 03/19/2003   Lymphedema    Obstructive sleep apnea    Peptic ulcer    TIA (transient ischemic attack)    Varicose veins    Laser ablation of left great saphenous vein 2009    Family History  Problem Relation Age of Onset   Bone cancer Mother    Hypertension Father    Pancreatic cancer Maternal Grandmother    Stroke Paternal Grandmother    Diabetes Paternal Grandfather    Heart attack Brother    Hypertension Brother    Heart attack Sister     Past Surgical History:  Procedure Laterality Date   AMPUTATION Left 10/21/2021   Procedure: LEFT BELOW KNEE AMPUTATION;  Surgeon: Timothy Ford, MD;  [facility];  Service: Orthopedics;  Laterality: Left;   BIOPSY  10/19/2021   Procedure: BIOPSY;  Surgeon: Daina Drum, MD;  Location: [facility] ENDOSCOPY;  Service: Gastroenterology;;   COLONOSCOPY N/A 10/19/2021   Procedure: COLONOSCOPY;  Surgeon: Daina Drum, MD;  Location: [facility] ENDOSCOPY;  Service: Gastroenterology;  Laterality: N/A;   CYSTOSCOPY WITH RETROGRADE PYELOGRAM, URETEROSCOPY AND STENT PLACEMENT Right 08/02/2015   Procedure: CYSTOSCOPY WITH RETROGRADE PYELOGRAM, URETEROSCOPY , STONE EXTRACTION AND STENT PLACEMENT;  Surgeon: Marco Severs, MD;  Location: [facility] ORS;  Service: Urology;  Laterality: Right;   ESOPHAGOGASTRODUODENOSCOPY (EGD) WITH PROPOFOL  N/A 10/19/2021   Procedure: ESOPHAGOGASTRODUODENOSCOPY (EGD) WITH PROPOFOL ;  Surgeon: Daina Drum, MD;  Location: [facility] ENDOSCOPY;  Service: Gastroenterology;  Laterality: N/A;   HOLMIUM LASER APPLICATION Right 08/02/2015   Procedure: HOLMIUM  LASER APPLICATION;  Surgeon: Marco Severs, MD;  [facility]: [facility];  Service: Urology;  Laterality: Right;   KIDNEY STONE SURGERY     POLYPECTOMY  10/19/2021   Procedure: POLYPECTOMY;  Surgeon: Daina Drum, MD;  Location: [facility] ENDOSCOPY;  Service: Gastroenterology;;   VASECTOMY  1984   VEIN SURGERY Left 06/2010   Laser ablation   Social History   Occupational History   Occupation: controll room operator     Employer: [employer]  Tobacco Use   Smoking status: Never  Passive exposure: Never   Smokeless tobacco: Never  Vaping Use   Vaping status: Never Used  Substance and Sexual Activity   Alcohol  use: No    Alcohol /week: 0.0 standard drinks of alcohol    Drug use: No   Sexual activity: Not on file

## 2024-03-05 DIAGNOSIS — I872 Venous insufficiency (chronic) (peripheral): Secondary | ICD-10-CM | POA: Diagnosis not present

## 2024-03-05 DIAGNOSIS — I890 Lymphedema, not elsewhere classified: Secondary | ICD-10-CM | POA: Diagnosis not present

## 2024-03-05 DIAGNOSIS — I110 Hypertensive heart disease with heart failure: Secondary | ICD-10-CM | POA: Diagnosis not present

## 2024-03-05 DIAGNOSIS — I5033 Acute on chronic diastolic (congestive) heart failure: Secondary | ICD-10-CM | POA: Diagnosis not present

## 2024-03-05 DIAGNOSIS — L97811 Non-pressure chronic ulcer of other part of right lower leg limited to breakdown of skin: Secondary | ICD-10-CM | POA: Diagnosis not present

## 2024-03-05 DIAGNOSIS — E1151 Type 2 diabetes mellitus with diabetic peripheral angiopathy without gangrene: Secondary | ICD-10-CM | POA: Diagnosis not present

## 2024-03-07 DIAGNOSIS — I890 Lymphedema, not elsewhere classified: Secondary | ICD-10-CM | POA: Diagnosis not present

## 2024-03-07 DIAGNOSIS — I5033 Acute on chronic diastolic (congestive) heart failure: Secondary | ICD-10-CM | POA: Diagnosis not present

## 2024-03-07 DIAGNOSIS — I872 Venous insufficiency (chronic) (peripheral): Secondary | ICD-10-CM | POA: Diagnosis not present

## 2024-03-07 DIAGNOSIS — I110 Hypertensive heart disease with heart failure: Secondary | ICD-10-CM | POA: Diagnosis not present

## 2024-03-07 DIAGNOSIS — E1151 Type 2 diabetes mellitus with diabetic peripheral angiopathy without gangrene: Secondary | ICD-10-CM | POA: Diagnosis not present

## 2024-03-07 DIAGNOSIS — L97811 Non-pressure chronic ulcer of other part of right lower leg limited to breakdown of skin: Secondary | ICD-10-CM | POA: Diagnosis not present

## 2024-03-11 DIAGNOSIS — I110 Hypertensive heart disease with heart failure: Secondary | ICD-10-CM | POA: Diagnosis not present

## 2024-03-11 DIAGNOSIS — I5033 Acute on chronic diastolic (congestive) heart failure: Secondary | ICD-10-CM | POA: Diagnosis not present

## 2024-03-11 DIAGNOSIS — E1151 Type 2 diabetes mellitus with diabetic peripheral angiopathy without gangrene: Secondary | ICD-10-CM | POA: Diagnosis not present

## 2024-03-11 DIAGNOSIS — I872 Venous insufficiency (chronic) (peripheral): Secondary | ICD-10-CM | POA: Diagnosis not present

## 2024-03-11 DIAGNOSIS — L97811 Non-pressure chronic ulcer of other part of right lower leg limited to breakdown of skin: Secondary | ICD-10-CM | POA: Diagnosis not present

## 2024-03-11 DIAGNOSIS — I890 Lymphedema, not elsewhere classified: Secondary | ICD-10-CM | POA: Diagnosis not present

## 2024-03-13 DIAGNOSIS — E1151 Type 2 diabetes mellitus with diabetic peripheral angiopathy without gangrene: Secondary | ICD-10-CM | POA: Diagnosis not present

## 2024-03-13 DIAGNOSIS — I872 Venous insufficiency (chronic) (peripheral): Secondary | ICD-10-CM | POA: Diagnosis not present

## 2024-03-13 DIAGNOSIS — I110 Hypertensive heart disease with heart failure: Secondary | ICD-10-CM | POA: Diagnosis not present

## 2024-03-13 DIAGNOSIS — I890 Lymphedema, not elsewhere classified: Secondary | ICD-10-CM | POA: Diagnosis not present

## 2024-03-13 DIAGNOSIS — I5033 Acute on chronic diastolic (congestive) heart failure: Secondary | ICD-10-CM | POA: Diagnosis not present

## 2024-03-13 DIAGNOSIS — L97811 Non-pressure chronic ulcer of other part of right lower leg limited to breakdown of skin: Secondary | ICD-10-CM | POA: Diagnosis not present

## 2024-03-14 ENCOUNTER — Telehealth: Payer: Self-pay | Admitting: Orthopedic Surgery

## 2024-03-14 DIAGNOSIS — E1151 Type 2 diabetes mellitus with diabetic peripheral angiopathy without gangrene: Secondary | ICD-10-CM | POA: Diagnosis not present

## 2024-03-14 DIAGNOSIS — I110 Hypertensive heart disease with heart failure: Secondary | ICD-10-CM | POA: Diagnosis not present

## 2024-03-14 DIAGNOSIS — I5033 Acute on chronic diastolic (congestive) heart failure: Secondary | ICD-10-CM | POA: Diagnosis not present

## 2024-03-14 DIAGNOSIS — I890 Lymphedema, not elsewhere classified: Secondary | ICD-10-CM | POA: Diagnosis not present

## 2024-03-14 DIAGNOSIS — I509 Heart failure, unspecified: Secondary | ICD-10-CM | POA: Diagnosis not present

## 2024-03-14 DIAGNOSIS — I872 Venous insufficiency (chronic) (peripheral): Secondary | ICD-10-CM | POA: Diagnosis not present

## 2024-03-14 DIAGNOSIS — L97811 Non-pressure chronic ulcer of other part of right lower leg limited to breakdown of skin: Secondary | ICD-10-CM | POA: Diagnosis not present

## 2024-03-14 NOTE — Telephone Encounter (Signed)
 [facility] called stating they faxed orders for Harden to please sign and send back as soon as possible.

## 2024-03-14 NOTE — Telephone Encounter (Signed)
 Pending signature will sign Monday upon Dr. Crist return

## 2024-03-18 DIAGNOSIS — I890 Lymphedema, not elsewhere classified: Secondary | ICD-10-CM | POA: Diagnosis not present

## 2024-03-18 DIAGNOSIS — I110 Hypertensive heart disease with heart failure: Secondary | ICD-10-CM | POA: Diagnosis not present

## 2024-03-18 DIAGNOSIS — I5033 Acute on chronic diastolic (congestive) heart failure: Secondary | ICD-10-CM | POA: Diagnosis not present

## 2024-03-18 DIAGNOSIS — I872 Venous insufficiency (chronic) (peripheral): Secondary | ICD-10-CM | POA: Diagnosis not present

## 2024-03-18 DIAGNOSIS — E1151 Type 2 diabetes mellitus with diabetic peripheral angiopathy without gangrene: Secondary | ICD-10-CM | POA: Diagnosis not present

## 2024-03-18 DIAGNOSIS — L97811 Non-pressure chronic ulcer of other part of right lower leg limited to breakdown of skin: Secondary | ICD-10-CM | POA: Diagnosis not present

## 2024-03-19 DIAGNOSIS — I110 Hypertensive heart disease with heart failure: Secondary | ICD-10-CM | POA: Diagnosis not present

## 2024-03-19 DIAGNOSIS — I872 Venous insufficiency (chronic) (peripheral): Secondary | ICD-10-CM | POA: Diagnosis not present

## 2024-03-19 DIAGNOSIS — L97811 Non-pressure chronic ulcer of other part of right lower leg limited to breakdown of skin: Secondary | ICD-10-CM | POA: Diagnosis not present

## 2024-03-19 DIAGNOSIS — E1151 Type 2 diabetes mellitus with diabetic peripheral angiopathy without gangrene: Secondary | ICD-10-CM | POA: Diagnosis not present

## 2024-03-19 DIAGNOSIS — I890 Lymphedema, not elsewhere classified: Secondary | ICD-10-CM | POA: Diagnosis not present

## 2024-03-19 DIAGNOSIS — I5033 Acute on chronic diastolic (congestive) heart failure: Secondary | ICD-10-CM | POA: Diagnosis not present

## 2024-03-20 DIAGNOSIS — I110 Hypertensive heart disease with heart failure: Secondary | ICD-10-CM | POA: Diagnosis not present

## 2024-03-20 DIAGNOSIS — E1151 Type 2 diabetes mellitus with diabetic peripheral angiopathy without gangrene: Secondary | ICD-10-CM | POA: Diagnosis not present

## 2024-03-20 DIAGNOSIS — I872 Venous insufficiency (chronic) (peripheral): Secondary | ICD-10-CM | POA: Diagnosis not present

## 2024-03-20 DIAGNOSIS — I890 Lymphedema, not elsewhere classified: Secondary | ICD-10-CM | POA: Diagnosis not present

## 2024-03-20 DIAGNOSIS — L97811 Non-pressure chronic ulcer of other part of right lower leg limited to breakdown of skin: Secondary | ICD-10-CM | POA: Diagnosis not present

## 2024-03-20 DIAGNOSIS — I5033 Acute on chronic diastolic (congestive) heart failure: Secondary | ICD-10-CM | POA: Diagnosis not present

## 2024-03-24 DIAGNOSIS — H40053 Ocular hypertension, bilateral: Secondary | ICD-10-CM | POA: Diagnosis not present

## 2024-03-25 DIAGNOSIS — E1151 Type 2 diabetes mellitus with diabetic peripheral angiopathy without gangrene: Secondary | ICD-10-CM | POA: Diagnosis not present

## 2024-03-25 DIAGNOSIS — L97811 Non-pressure chronic ulcer of other part of right lower leg limited to breakdown of skin: Secondary | ICD-10-CM | POA: Diagnosis not present

## 2024-03-25 DIAGNOSIS — I890 Lymphedema, not elsewhere classified: Secondary | ICD-10-CM | POA: Diagnosis not present

## 2024-03-25 DIAGNOSIS — I5033 Acute on chronic diastolic (congestive) heart failure: Secondary | ICD-10-CM | POA: Diagnosis not present

## 2024-03-25 DIAGNOSIS — I872 Venous insufficiency (chronic) (peripheral): Secondary | ICD-10-CM | POA: Diagnosis not present

## 2024-03-25 DIAGNOSIS — I110 Hypertensive heart disease with heart failure: Secondary | ICD-10-CM | POA: Diagnosis not present

## 2024-03-26 DIAGNOSIS — I110 Hypertensive heart disease with heart failure: Secondary | ICD-10-CM | POA: Diagnosis not present

## 2024-03-26 DIAGNOSIS — L97811 Non-pressure chronic ulcer of other part of right lower leg limited to breakdown of skin: Secondary | ICD-10-CM | POA: Diagnosis not present

## 2024-03-26 DIAGNOSIS — I872 Venous insufficiency (chronic) (peripheral): Secondary | ICD-10-CM | POA: Diagnosis not present

## 2024-03-26 DIAGNOSIS — E1151 Type 2 diabetes mellitus with diabetic peripheral angiopathy without gangrene: Secondary | ICD-10-CM | POA: Diagnosis not present

## 2024-03-26 DIAGNOSIS — I890 Lymphedema, not elsewhere classified: Secondary | ICD-10-CM | POA: Diagnosis not present

## 2024-03-26 DIAGNOSIS — I5033 Acute on chronic diastolic (congestive) heart failure: Secondary | ICD-10-CM | POA: Diagnosis not present

## 2024-03-28 DIAGNOSIS — I890 Lymphedema, not elsewhere classified: Secondary | ICD-10-CM | POA: Diagnosis not present

## 2024-03-28 DIAGNOSIS — E1151 Type 2 diabetes mellitus with diabetic peripheral angiopathy without gangrene: Secondary | ICD-10-CM | POA: Diagnosis not present

## 2024-03-28 DIAGNOSIS — I872 Venous insufficiency (chronic) (peripheral): Secondary | ICD-10-CM | POA: Diagnosis not present

## 2024-03-28 DIAGNOSIS — I110 Hypertensive heart disease with heart failure: Secondary | ICD-10-CM | POA: Diagnosis not present

## 2024-03-28 DIAGNOSIS — L97811 Non-pressure chronic ulcer of other part of right lower leg limited to breakdown of skin: Secondary | ICD-10-CM | POA: Diagnosis not present

## 2024-03-28 DIAGNOSIS — I5033 Acute on chronic diastolic (congestive) heart failure: Secondary | ICD-10-CM | POA: Diagnosis not present

## 2024-03-29 DIAGNOSIS — L97811 Non-pressure chronic ulcer of other part of right lower leg limited to breakdown of skin: Secondary | ICD-10-CM | POA: Diagnosis not present

## 2024-03-29 DIAGNOSIS — Z6841 Body Mass Index (BMI) 40.0 and over, adult: Secondary | ICD-10-CM | POA: Diagnosis not present

## 2024-03-29 DIAGNOSIS — I890 Lymphedema, not elsewhere classified: Secondary | ICD-10-CM | POA: Diagnosis not present

## 2024-03-29 DIAGNOSIS — G4733 Obstructive sleep apnea (adult) (pediatric): Secondary | ICD-10-CM | POA: Diagnosis not present

## 2024-03-29 DIAGNOSIS — K5790 Diverticulosis of intestine, part unspecified, without perforation or abscess without bleeding: Secondary | ICD-10-CM | POA: Diagnosis not present

## 2024-03-29 DIAGNOSIS — Z89512 Acquired absence of left leg below knee: Secondary | ICD-10-CM | POA: Diagnosis not present

## 2024-03-29 DIAGNOSIS — Z860101 Personal history of adenomatous and serrated colon polyps: Secondary | ICD-10-CM | POA: Diagnosis not present

## 2024-03-29 DIAGNOSIS — E1151 Type 2 diabetes mellitus with diabetic peripheral angiopathy without gangrene: Secondary | ICD-10-CM | POA: Diagnosis not present

## 2024-03-29 DIAGNOSIS — Z8616 Personal history of COVID-19: Secondary | ICD-10-CM | POA: Diagnosis not present

## 2024-03-29 DIAGNOSIS — Z7984 Long term (current) use of oral hypoglycemic drugs: Secondary | ICD-10-CM | POA: Diagnosis not present

## 2024-03-29 DIAGNOSIS — Z8673 Personal history of transient ischemic attack (TIA), and cerebral infarction without residual deficits: Secondary | ICD-10-CM | POA: Diagnosis not present

## 2024-03-29 DIAGNOSIS — I110 Hypertensive heart disease with heart failure: Secondary | ICD-10-CM | POA: Diagnosis not present

## 2024-03-29 DIAGNOSIS — Z9981 Dependence on supplemental oxygen: Secondary | ICD-10-CM | POA: Diagnosis not present

## 2024-03-29 DIAGNOSIS — I872 Venous insufficiency (chronic) (peripheral): Secondary | ICD-10-CM | POA: Diagnosis not present

## 2024-03-29 DIAGNOSIS — D649 Anemia, unspecified: Secondary | ICD-10-CM | POA: Diagnosis not present

## 2024-03-29 DIAGNOSIS — I5033 Acute on chronic diastolic (congestive) heart failure: Secondary | ICD-10-CM | POA: Diagnosis not present

## 2024-03-29 DIAGNOSIS — Z7902 Long term (current) use of antithrombotics/antiplatelets: Secondary | ICD-10-CM | POA: Diagnosis not present

## 2024-03-31 DIAGNOSIS — I890 Lymphedema, not elsewhere classified: Secondary | ICD-10-CM | POA: Diagnosis not present

## 2024-03-31 DIAGNOSIS — L97811 Non-pressure chronic ulcer of other part of right lower leg limited to breakdown of skin: Secondary | ICD-10-CM | POA: Diagnosis not present

## 2024-03-31 DIAGNOSIS — I110 Hypertensive heart disease with heart failure: Secondary | ICD-10-CM | POA: Diagnosis not present

## 2024-03-31 DIAGNOSIS — I5033 Acute on chronic diastolic (congestive) heart failure: Secondary | ICD-10-CM | POA: Diagnosis not present

## 2024-03-31 DIAGNOSIS — I872 Venous insufficiency (chronic) (peripheral): Secondary | ICD-10-CM | POA: Diagnosis not present

## 2024-03-31 DIAGNOSIS — E1151 Type 2 diabetes mellitus with diabetic peripheral angiopathy without gangrene: Secondary | ICD-10-CM | POA: Diagnosis not present

## 2024-04-03 ENCOUNTER — Ambulatory Visit (INDEPENDENT_AMBULATORY_CARE_PROVIDER_SITE_OTHER): Admitting: Orthopedic Surgery

## 2024-04-03 DIAGNOSIS — I83019 Varicose veins of right lower extremity with ulcer of unspecified site: Secondary | ICD-10-CM | POA: Diagnosis not present

## 2024-04-03 DIAGNOSIS — Z89512 Acquired absence of left leg below knee: Secondary | ICD-10-CM

## 2024-04-03 DIAGNOSIS — I890 Lymphedema, not elsewhere classified: Secondary | ICD-10-CM | POA: Diagnosis not present

## 2024-04-03 DIAGNOSIS — L97919 Non-pressure chronic ulcer of unspecified part of right lower leg with unspecified severity: Secondary | ICD-10-CM | POA: Diagnosis not present

## 2024-04-06 DIAGNOSIS — J9621 Acute and chronic respiratory failure with hypoxia: Secondary | ICD-10-CM | POA: Diagnosis not present

## 2024-04-06 DIAGNOSIS — N3000 Acute cystitis without hematuria: Secondary | ICD-10-CM | POA: Diagnosis not present

## 2024-04-06 DIAGNOSIS — Z993 Dependence on wheelchair: Secondary | ICD-10-CM | POA: Diagnosis not present

## 2024-04-06 DIAGNOSIS — Z8673 Personal history of transient ischemic attack (TIA), and cerebral infarction without residual deficits: Secondary | ICD-10-CM | POA: Diagnosis not present

## 2024-04-06 DIAGNOSIS — I440 Atrioventricular block, first degree: Secondary | ICD-10-CM | POA: Diagnosis not present

## 2024-04-06 DIAGNOSIS — R918 Other nonspecific abnormal finding of lung field: Secondary | ICD-10-CM | POA: Diagnosis not present

## 2024-04-06 DIAGNOSIS — E785 Hyperlipidemia, unspecified: Secondary | ICD-10-CM | POA: Diagnosis not present

## 2024-04-06 DIAGNOSIS — N179 Acute kidney failure, unspecified: Secondary | ICD-10-CM | POA: Diagnosis not present

## 2024-04-06 DIAGNOSIS — J9811 Atelectasis: Secondary | ICD-10-CM | POA: Diagnosis not present

## 2024-04-06 DIAGNOSIS — R0602 Shortness of breath: Secondary | ICD-10-CM | POA: Diagnosis not present

## 2024-04-06 DIAGNOSIS — G629 Polyneuropathy, unspecified: Secondary | ICD-10-CM | POA: Diagnosis not present

## 2024-04-06 DIAGNOSIS — I5021 Acute systolic (congestive) heart failure: Secondary | ICD-10-CM | POA: Diagnosis not present

## 2024-04-06 DIAGNOSIS — B964 Proteus (mirabilis) (morganii) as the cause of diseases classified elsewhere: Secondary | ICD-10-CM | POA: Diagnosis not present

## 2024-04-06 DIAGNOSIS — M109 Gout, unspecified: Secondary | ICD-10-CM | POA: Diagnosis not present

## 2024-04-06 DIAGNOSIS — J9622 Acute and chronic respiratory failure with hypercapnia: Secondary | ICD-10-CM | POA: Diagnosis not present

## 2024-04-06 DIAGNOSIS — G4733 Obstructive sleep apnea (adult) (pediatric): Secondary | ICD-10-CM | POA: Diagnosis not present

## 2024-04-06 DIAGNOSIS — I4510 Unspecified right bundle-branch block: Secondary | ICD-10-CM | POA: Diagnosis not present

## 2024-04-06 DIAGNOSIS — Z6841 Body Mass Index (BMI) 40.0 and over, adult: Secondary | ICD-10-CM | POA: Diagnosis not present

## 2024-04-06 DIAGNOSIS — N390 Urinary tract infection, site not specified: Secondary | ICD-10-CM | POA: Diagnosis not present

## 2024-04-06 DIAGNOSIS — I5033 Acute on chronic diastolic (congestive) heart failure: Secondary | ICD-10-CM | POA: Diagnosis not present

## 2024-04-06 DIAGNOSIS — J9602 Acute respiratory failure with hypercapnia: Secondary | ICD-10-CM | POA: Diagnosis not present

## 2024-04-06 DIAGNOSIS — E662 Morbid (severe) obesity with alveolar hypoventilation: Secondary | ICD-10-CM | POA: Diagnosis not present

## 2024-04-06 DIAGNOSIS — B952 Enterococcus as the cause of diseases classified elsewhere: Secondary | ICD-10-CM | POA: Diagnosis not present

## 2024-04-06 DIAGNOSIS — L538 Other specified erythematous conditions: Secondary | ICD-10-CM | POA: Diagnosis not present

## 2024-04-06 DIAGNOSIS — J811 Chronic pulmonary edema: Secondary | ICD-10-CM | POA: Diagnosis not present

## 2024-04-06 DIAGNOSIS — Z89512 Acquired absence of left leg below knee: Secondary | ICD-10-CM | POA: Diagnosis not present

## 2024-04-06 DIAGNOSIS — I110 Hypertensive heart disease with heart failure: Secondary | ICD-10-CM | POA: Diagnosis not present

## 2024-04-06 DIAGNOSIS — J9601 Acute respiratory failure with hypoxia: Secondary | ICD-10-CM | POA: Diagnosis not present

## 2024-04-06 DIAGNOSIS — I272 Pulmonary hypertension, unspecified: Secondary | ICD-10-CM | POA: Diagnosis not present

## 2024-04-06 DIAGNOSIS — Z79899 Other long term (current) drug therapy: Secondary | ICD-10-CM | POA: Diagnosis not present

## 2024-04-06 DIAGNOSIS — I872 Venous insufficiency (chronic) (peripheral): Secondary | ICD-10-CM | POA: Diagnosis not present

## 2024-04-06 DIAGNOSIS — Z7902 Long term (current) use of antithrombotics/antiplatelets: Secondary | ICD-10-CM | POA: Diagnosis not present

## 2024-04-11 ENCOUNTER — Telehealth: Payer: Self-pay

## 2024-04-11 NOTE — Transitions of Care (Post Inpatient/ED Visit) (Signed)
   04/11/2024  Name: [name] [name] MRN: [id number] DOB: 1956-07-10  Today's TOC FU Call Status: Today's TOC FU Call Status:: Unsuccessful Call (1st Attempt) Unsuccessful Call (1st Attempt) Date: 04/11/24  Attempted to reach the patient regarding the most recent Inpatient/ED visit.  Follow Up Plan: Additional outreach attempts will be made to reach the patient to complete the Transitions of Care (Post Inpatient/ED visit) call.    Bing Edison MSN, RN RN Case Sales executive Health  [facility] Office Hours M-F [phone] Direct Dial: [phone] Main Phone [phone]  Fax: [phone] [email or web address]

## 2024-04-11 NOTE — Transitions of Care (Post Inpatient/ED Visit) (Signed)
   04/11/2024  Name: [name] [name] MRN: [id number] DOB: 1955-10-10  Today's TOC FU Call Status: Today's TOC FU Call Status:: Unsuccessful Call (2nd Attempt) Unsuccessful Call (2nd Attempt) Date: 04/11/24 (3 pm)  Attempted to reach the patient regarding the most recent Inpatient/ED visit. Unable to retrieve DC Summary or this current admission information via Care Everywhere. Contacted PCP office at [facility] and they have not record of admission or recent discharge from [facility] which is [facility] of [facility]. Will check in again in am and outreach patient attempt again.   Follow Up Plan: Additional outreach attempts will be made to reach the patient to complete the Transitions of Care (Post Inpatient/ED visit) call.    Bing Edison MSN, RN RN Case Sales executive Health  [facility] Office Hours M-F [phone] Direct Dial: [phone] Main Phone [phone]  Fax: [phone] [email or web address]

## 2024-04-12 DIAGNOSIS — I890 Lymphedema, not elsewhere classified: Secondary | ICD-10-CM | POA: Diagnosis not present

## 2024-04-12 DIAGNOSIS — I872 Venous insufficiency (chronic) (peripheral): Secondary | ICD-10-CM | POA: Diagnosis not present

## 2024-04-12 DIAGNOSIS — I110 Hypertensive heart disease with heart failure: Secondary | ICD-10-CM | POA: Diagnosis not present

## 2024-04-12 DIAGNOSIS — L97811 Non-pressure chronic ulcer of other part of right lower leg limited to breakdown of skin: Secondary | ICD-10-CM | POA: Diagnosis not present

## 2024-04-12 DIAGNOSIS — E1151 Type 2 diabetes mellitus with diabetic peripheral angiopathy without gangrene: Secondary | ICD-10-CM | POA: Diagnosis not present

## 2024-04-12 DIAGNOSIS — I5033 Acute on chronic diastolic (congestive) heart failure: Secondary | ICD-10-CM | POA: Diagnosis not present

## 2024-04-13 ENCOUNTER — Encounter: Payer: Self-pay | Admitting: Orthopedic Surgery

## 2024-04-13 NOTE — Progress Notes (Signed)
 Office Visit Note   Patient: [name] [name]           Date of Birth: Nov 18, 1955           MRN: [id number] Visit Date: 04/03/2024              Requested by: Sasser, Deward LELON, MD [address] [place],  [place] [address] PCP: Atilano Deward LELON, MD  Chief Complaint  Patient presents with   Right Leg - Follow-up      HPI: Patient is a 68 year old gentleman who is seen in follow-up for both lower extremities.  Patient is currently in a motorized wheelchair.  Currently having dressing changed 2 times a week with home health nursing.  Assessment & Plan: Visit Diagnoses:  1. Venous ulcer of right leg (HCC)   2. Left below-knee amputee (HCC)   3. Lymphedema     Plan: Continue current wound care recommended elevation.  A prescription was provided for a [employer] lift.  Follow-Up Instructions: No follow-ups on file.   Ortho Exam  Patient is alert, oriented, no adenopathy, well-dressed, normal affect, normal respiratory effort. Examination of both lower extremities patient has increased swelling in the right foot there is maceration from the drainage.  Appears to be secondary to the Unna paste and the drainage.  Recommended discontinuing the Unna paste.  Patient with progressive increased swelling from the leg being dependent.    Imaging: No results found. No images are attached to the encounter.  Labs: Lab Results  Component Value Date   HGBA1C 7.3 (H) 10/17/2021   HGBA1C 6.5 (H) 09/12/2019   CRP 17.6 (H) 09/14/2019   CRP 14.7 (H) 09/13/2019   CRP 14.6 (H) 09/12/2019   REPTSTATUS 11/20/2021 FINAL 11/17/2021   CULT (A) 11/17/2021    >=100,000 COLONIES/mL ESCHERICHIA COLI >=100,000 COLONIES/mL KLEBSIELLA OXYTOCA    LABORGA ESCHERICHIA COLI (A) 11/17/2021   LABORGA KLEBSIELLA OXYTOCA (A) 11/17/2021     Lab Results  Component Value Date   ALBUMIN 2.6 (L) 11/10/2021   ALBUMIN 1.8 (L) 10/25/2021   ALBUMIN 2.5 (L) 10/14/2021    Lab Results  Component Value Date   MG  2.1 09/14/2019   MG 2.3 09/13/2019   MG 1.9 09/12/2019   No results found for: VD25OH  No results found for: PREALBUMIN    Latest Ref Rng & Units 12/21/2021    9:53 AM 11/10/2021    6:54 AM 10/31/2021    5:00 AM  CBC EXTENDED  WBC 4.0 - 10.5 K/uL 7.6  6.5  7.9   RBC 4.22 - 5.81 Mil/uL 4.57  3.88  3.22   Hemoglobin 13.0 - 17.0 g/dL 88.5  9.5  7.8   HCT 60.9 - 52.0 % 35.6  33.0  26.5   Platelets 150.0 - 400.0 K/uL 230.0  176  243   NEUT# 1.4 - 7.7 K/uL 5.3  4.3    Lymph# 0.7 - 4.0 K/uL 0.9  0.8       There is no height or weight on file to calculate BMI.  Orders:  No orders of the defined types were placed in this encounter.  No orders of the defined types were placed in this encounter.    Procedures: No procedures performed  Clinical Data: No additional findings.  ROS:  All other systems negative, except as noted in the HPI. Review of Systems  Objective: Vital Signs: There were no vitals taken for this visit.  Specialty Comments:  No specialty comments  available.  PMFS History: Patient Active Problem List   Diagnosis Date Noted   Acute on chronic diastolic (congestive) heart failure (HCC)    Controlled type 2 diabetes mellitus with hyperglycemia, without long-term current use of insulin  (HCC)    Phantom limb pain (HCC)    Left below-knee amputee (HCC) 10/25/2021   Left leg pain    Osteomyelitis of ankle and foot (HCC) 10/15/2021   Anemia 10/15/2021   Pressure ulcer 10/15/2021   History of CVA (cerebrovascular accident) 10/15/2021   Septic arthritis of ankle and foot region (HCC) 10/15/2021   AKI (acute kidney injury) (HCC) 10/14/2021   Pneumonia due to COVID-19 virus 09/09/2019   Hypokalemia 09/09/2019   Acute respiratory failure with hypoxia (HCC) 09/09/2019   Essential hypertension 10/14/2014   Chronic diastolic heart failure (HCC) 10/14/2014   Morbid obesity (HCC) 07/22/2013   Varicose veins of bilateral lower extremities with other complications  07/22/2013   OSA (obstructive sleep apnea) 07/22/2013   Venous stasis ulcers (HCC) 07/22/2013   Diabetes mellitus (HCC) 01/03/2011   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HCC)    Diverticulosis    Essential hypertension    Gastritis    History of nephrolithiasis    History of open leg wound    History of stroke 03/19/2003   Lymphedema    Obstructive sleep apnea    Peptic ulcer    TIA (transient ischemic attack)    Varicose veins    Laser ablation of left great saphenous vein 2009    Family History  Problem Relation Age of Onset   Bone cancer Mother    Hypertension Father    Pancreatic cancer Maternal Grandmother    Stroke Paternal Grandmother    Diabetes Paternal Grandfather    Heart attack Brother    Hypertension Brother    Heart attack Sister     Past Surgical History:  Procedure Laterality Date   AMPUTATION Left 10/21/2021   Procedure: LEFT BELOW KNEE AMPUTATION;  Surgeon: Harden Jerona GAILS, MD;  Location: [facility] OR;  Service: Orthopedics;  Laterality: Left;   BIOPSY  10/19/2021   Procedure: BIOPSY;  Surgeon: Federico Rosario BROCKS, MD;  Location: [facility] ENDOSCOPY;  Service: Gastroenterology;;   COLONOSCOPY N/A 10/19/2021   Procedure: COLONOSCOPY;  Surgeon: Federico Rosario BROCKS, MD;  Location: [facility] ENDOSCOPY;  Service: Gastroenterology;  Laterality: N/A;   CYSTOSCOPY WITH RETROGRADE PYELOGRAM, URETEROSCOPY AND STENT PLACEMENT Right 08/02/2015   Procedure: CYSTOSCOPY WITH RETROGRADE PYELOGRAM, URETEROSCOPY , STONE EXTRACTION AND STENT PLACEMENT;  Surgeon: Belvie LITTIE Clara, MD;  Location: [facility] ORS;  Service: Urology;  Laterality: Right;   ESOPHAGOGASTRODUODENOSCOPY (EGD) WITH PROPOFOL  N/A 10/19/2021   Procedure: ESOPHAGOGASTRODUODENOSCOPY (EGD) WITH PROPOFOL ;  Surgeon: Federico Rosario BROCKS, MD;  Location: [facility] ENDOSCOPY;  Service: Gastroenterology;  Laterality: N/A;   HOLMIUM LASER APPLICATION Right 08/02/2015   Procedure: HOLMIUM LASER APPLICATION;  Surgeon: Belvie LITTIE Clara, MD;  Location: [facility];  Service: Urology;  Laterality: Right;   KIDNEY STONE SURGERY     POLYPECTOMY  10/19/2021   Procedure: POLYPECTOMY;  Surgeon: Federico Rosario BROCKS, MD;  Location: [facility] ENDOSCOPY;  Service: Gastroenterology;;   KATY  1984   VEIN SURGERY Left 06/2010   Laser ablation   Social History   Occupational History   Occupation: controll room operator     Employer: [employer]  Tobacco Use   Smoking status: Never    Passive exposure: Never   Smokeless tobacco: Never  Vaping  Use   Vaping status: Never Used  Substance and Sexual Activity   Alcohol  use: No    Alcohol /week: 0.0 standard drinks of alcohol    Drug use: No   Sexual activity: Not on file

## 2024-04-14 ENCOUNTER — Encounter: Payer: Self-pay | Admitting: *Deleted

## 2024-04-14 DIAGNOSIS — I5033 Acute on chronic diastolic (congestive) heart failure: Secondary | ICD-10-CM | POA: Diagnosis not present

## 2024-04-14 DIAGNOSIS — E1151 Type 2 diabetes mellitus with diabetic peripheral angiopathy without gangrene: Secondary | ICD-10-CM | POA: Diagnosis not present

## 2024-04-14 DIAGNOSIS — I890 Lymphedema, not elsewhere classified: Secondary | ICD-10-CM | POA: Diagnosis not present

## 2024-04-14 DIAGNOSIS — I872 Venous insufficiency (chronic) (peripheral): Secondary | ICD-10-CM | POA: Diagnosis not present

## 2024-04-14 DIAGNOSIS — I110 Hypertensive heart disease with heart failure: Secondary | ICD-10-CM | POA: Diagnosis not present

## 2024-04-14 DIAGNOSIS — L97811 Non-pressure chronic ulcer of other part of right lower leg limited to breakdown of skin: Secondary | ICD-10-CM | POA: Diagnosis not present

## 2024-04-14 NOTE — Telephone Encounter (Signed)
 This encounter was created in error - please disregard.

## 2024-04-15 ENCOUNTER — Telehealth: Payer: Self-pay | Admitting: Orthopedic Surgery

## 2024-04-15 DIAGNOSIS — I5033 Acute on chronic diastolic (congestive) heart failure: Secondary | ICD-10-CM | POA: Diagnosis not present

## 2024-04-15 DIAGNOSIS — I890 Lymphedema, not elsewhere classified: Secondary | ICD-10-CM | POA: Diagnosis not present

## 2024-04-15 DIAGNOSIS — G4733 Obstructive sleep apnea (adult) (pediatric): Secondary | ICD-10-CM | POA: Diagnosis not present

## 2024-04-15 DIAGNOSIS — I872 Venous insufficiency (chronic) (peripheral): Secondary | ICD-10-CM | POA: Diagnosis not present

## 2024-04-15 DIAGNOSIS — E1151 Type 2 diabetes mellitus with diabetic peripheral angiopathy without gangrene: Secondary | ICD-10-CM | POA: Diagnosis not present

## 2024-04-15 DIAGNOSIS — L97811 Non-pressure chronic ulcer of other part of right lower leg limited to breakdown of skin: Secondary | ICD-10-CM | POA: Diagnosis not present

## 2024-04-15 DIAGNOSIS — I110 Hypertensive heart disease with heart failure: Secondary | ICD-10-CM | POA: Diagnosis not present

## 2024-04-15 NOTE — Telephone Encounter (Signed)
 Called and sw nurse to give verbal ok for continued [facility] care orders.

## 2024-04-15 NOTE — Telephone Encounter (Signed)
See previous message this has been done.

## 2024-04-15 NOTE — Telephone Encounter (Signed)
 Added note orders are for wound care re cert form [facility]. Alan secure number is [phone].

## 2024-04-15 NOTE — Telephone Encounter (Signed)
 Alan Banker) from [facility] called for recert orders for 8wk 2. Alan secure number is [phone].

## 2024-04-17 ENCOUNTER — Telehealth: Payer: Self-pay

## 2024-04-17 DIAGNOSIS — I872 Venous insufficiency (chronic) (peripheral): Secondary | ICD-10-CM | POA: Diagnosis not present

## 2024-04-17 DIAGNOSIS — L97811 Non-pressure chronic ulcer of other part of right lower leg limited to breakdown of skin: Secondary | ICD-10-CM | POA: Diagnosis not present

## 2024-04-17 DIAGNOSIS — I5033 Acute on chronic diastolic (congestive) heart failure: Secondary | ICD-10-CM | POA: Diagnosis not present

## 2024-04-17 DIAGNOSIS — I110 Hypertensive heart disease with heart failure: Secondary | ICD-10-CM | POA: Diagnosis not present

## 2024-04-17 DIAGNOSIS — I890 Lymphedema, not elsewhere classified: Secondary | ICD-10-CM | POA: Diagnosis not present

## 2024-04-17 DIAGNOSIS — E1151 Type 2 diabetes mellitus with diabetic peripheral angiopathy without gangrene: Secondary | ICD-10-CM | POA: Diagnosis not present

## 2024-04-17 DIAGNOSIS — I509 Heart failure, unspecified: Secondary | ICD-10-CM | POA: Diagnosis not present

## 2024-04-17 NOTE — Telephone Encounter (Signed)
 Leita, PT with [facility] called stating Rx for hoyer lift needs to be faxed along with diagnosis and office notes to [facility] at [phone].  Cb# for [facility] [phone].  Please advise.  Thank you.

## 2024-04-18 ENCOUNTER — Other Ambulatory Visit: Payer: Self-pay

## 2024-04-18 DIAGNOSIS — I110 Hypertensive heart disease with heart failure: Secondary | ICD-10-CM | POA: Diagnosis not present

## 2024-04-18 DIAGNOSIS — L97811 Non-pressure chronic ulcer of other part of right lower leg limited to breakdown of skin: Secondary | ICD-10-CM | POA: Diagnosis not present

## 2024-04-18 DIAGNOSIS — I890 Lymphedema, not elsewhere classified: Secondary | ICD-10-CM | POA: Diagnosis not present

## 2024-04-18 DIAGNOSIS — I872 Venous insufficiency (chronic) (peripheral): Secondary | ICD-10-CM | POA: Diagnosis not present

## 2024-04-18 DIAGNOSIS — E1151 Type 2 diabetes mellitus with diabetic peripheral angiopathy without gangrene: Secondary | ICD-10-CM | POA: Diagnosis not present

## 2024-04-18 DIAGNOSIS — I5033 Acute on chronic diastolic (congestive) heart failure: Secondary | ICD-10-CM | POA: Diagnosis not present

## 2024-04-18 NOTE — Telephone Encounter (Signed)
 Order faxed as requested

## 2024-04-22 DIAGNOSIS — I110 Hypertensive heart disease with heart failure: Secondary | ICD-10-CM | POA: Diagnosis not present

## 2024-04-22 DIAGNOSIS — I890 Lymphedema, not elsewhere classified: Secondary | ICD-10-CM | POA: Diagnosis not present

## 2024-04-22 DIAGNOSIS — E1151 Type 2 diabetes mellitus with diabetic peripheral angiopathy without gangrene: Secondary | ICD-10-CM | POA: Diagnosis not present

## 2024-04-22 DIAGNOSIS — L97811 Non-pressure chronic ulcer of other part of right lower leg limited to breakdown of skin: Secondary | ICD-10-CM | POA: Diagnosis not present

## 2024-04-22 DIAGNOSIS — I5033 Acute on chronic diastolic (congestive) heart failure: Secondary | ICD-10-CM | POA: Diagnosis not present

## 2024-04-22 DIAGNOSIS — I872 Venous insufficiency (chronic) (peripheral): Secondary | ICD-10-CM | POA: Diagnosis not present

## 2024-04-23 DIAGNOSIS — Z758 Other problems related to medical facilities and other health care: Secondary | ICD-10-CM | POA: Diagnosis not present

## 2024-04-23 DIAGNOSIS — Z09 Encounter for follow-up examination after completed treatment for conditions other than malignant neoplasm: Secondary | ICD-10-CM | POA: Diagnosis not present

## 2024-04-23 DIAGNOSIS — I509 Heart failure, unspecified: Secondary | ICD-10-CM | POA: Diagnosis not present

## 2024-04-23 DIAGNOSIS — R0602 Shortness of breath: Secondary | ICD-10-CM | POA: Diagnosis not present

## 2024-04-25 DIAGNOSIS — I872 Venous insufficiency (chronic) (peripheral): Secondary | ICD-10-CM | POA: Diagnosis not present

## 2024-04-25 DIAGNOSIS — L97811 Non-pressure chronic ulcer of other part of right lower leg limited to breakdown of skin: Secondary | ICD-10-CM | POA: Diagnosis not present

## 2024-04-25 DIAGNOSIS — I890 Lymphedema, not elsewhere classified: Secondary | ICD-10-CM | POA: Diagnosis not present

## 2024-04-25 DIAGNOSIS — I110 Hypertensive heart disease with heart failure: Secondary | ICD-10-CM | POA: Diagnosis not present

## 2024-04-25 DIAGNOSIS — I5033 Acute on chronic diastolic (congestive) heart failure: Secondary | ICD-10-CM | POA: Diagnosis not present

## 2024-04-25 DIAGNOSIS — E1151 Type 2 diabetes mellitus with diabetic peripheral angiopathy without gangrene: Secondary | ICD-10-CM | POA: Diagnosis not present

## 2024-04-28 DIAGNOSIS — I872 Venous insufficiency (chronic) (peripheral): Secondary | ICD-10-CM | POA: Diagnosis not present

## 2024-04-28 DIAGNOSIS — Z8673 Personal history of transient ischemic attack (TIA), and cerebral infarction without residual deficits: Secondary | ICD-10-CM | POA: Diagnosis not present

## 2024-04-28 DIAGNOSIS — G4733 Obstructive sleep apnea (adult) (pediatric): Secondary | ICD-10-CM | POA: Diagnosis not present

## 2024-04-28 DIAGNOSIS — I5033 Acute on chronic diastolic (congestive) heart failure: Secondary | ICD-10-CM | POA: Diagnosis not present

## 2024-04-28 DIAGNOSIS — Z7902 Long term (current) use of antithrombotics/antiplatelets: Secondary | ICD-10-CM | POA: Diagnosis not present

## 2024-04-28 DIAGNOSIS — B9689 Other specified bacterial agents as the cause of diseases classified elsewhere: Secondary | ICD-10-CM | POA: Diagnosis not present

## 2024-04-28 DIAGNOSIS — Z8616 Personal history of COVID-19: Secondary | ICD-10-CM | POA: Diagnosis not present

## 2024-04-28 DIAGNOSIS — L97811 Non-pressure chronic ulcer of other part of right lower leg limited to breakdown of skin: Secondary | ICD-10-CM | POA: Diagnosis not present

## 2024-04-28 DIAGNOSIS — I890 Lymphedema, not elsewhere classified: Secondary | ICD-10-CM | POA: Diagnosis not present

## 2024-04-28 DIAGNOSIS — Z9981 Dependence on supplemental oxygen: Secondary | ICD-10-CM | POA: Diagnosis not present

## 2024-04-28 DIAGNOSIS — Z6841 Body Mass Index (BMI) 40.0 and over, adult: Secondary | ICD-10-CM | POA: Diagnosis not present

## 2024-04-28 DIAGNOSIS — D649 Anemia, unspecified: Secondary | ICD-10-CM | POA: Diagnosis not present

## 2024-04-28 DIAGNOSIS — Z7984 Long term (current) use of oral hypoglycemic drugs: Secondary | ICD-10-CM | POA: Diagnosis not present

## 2024-04-28 DIAGNOSIS — I110 Hypertensive heart disease with heart failure: Secondary | ICD-10-CM | POA: Diagnosis not present

## 2024-04-28 DIAGNOSIS — B964 Proteus (mirabilis) (morganii) as the cause of diseases classified elsewhere: Secondary | ICD-10-CM | POA: Diagnosis not present

## 2024-04-28 DIAGNOSIS — N390 Urinary tract infection, site not specified: Secondary | ICD-10-CM | POA: Diagnosis not present

## 2024-04-28 DIAGNOSIS — L89892 Pressure ulcer of other site, stage 2: Secondary | ICD-10-CM | POA: Diagnosis not present

## 2024-04-28 DIAGNOSIS — Z860101 Personal history of adenomatous and serrated colon polyps: Secondary | ICD-10-CM | POA: Diagnosis not present

## 2024-04-28 DIAGNOSIS — E1151 Type 2 diabetes mellitus with diabetic peripheral angiopathy without gangrene: Secondary | ICD-10-CM | POA: Diagnosis not present

## 2024-04-28 DIAGNOSIS — K5790 Diverticulosis of intestine, part unspecified, without perforation or abscess without bleeding: Secondary | ICD-10-CM | POA: Diagnosis not present

## 2024-04-28 DIAGNOSIS — Z89512 Acquired absence of left leg below knee: Secondary | ICD-10-CM | POA: Diagnosis not present

## 2024-04-29 ENCOUNTER — Encounter: Payer: Self-pay | Admitting: Orthopedic Surgery

## 2024-04-29 ENCOUNTER — Ambulatory Visit: Admitting: Orthopedic Surgery

## 2024-04-29 DIAGNOSIS — I83019 Varicose veins of right lower extremity with ulcer of unspecified site: Secondary | ICD-10-CM | POA: Diagnosis not present

## 2024-04-29 DIAGNOSIS — Z89512 Acquired absence of left leg below knee: Secondary | ICD-10-CM | POA: Diagnosis not present

## 2024-04-29 DIAGNOSIS — I890 Lymphedema, not elsewhere classified: Secondary | ICD-10-CM | POA: Diagnosis not present

## 2024-04-29 DIAGNOSIS — L97919 Non-pressure chronic ulcer of unspecified part of right lower leg with unspecified severity: Secondary | ICD-10-CM | POA: Diagnosis not present

## 2024-04-29 NOTE — Progress Notes (Signed)
 Office Visit Note   Patient: [name] [name]           Date of Birth: 10-Aug-1956           MRN: [id number] Visit Date: 04/29/2024              Requested by: Sasser, Deward LELON, MD [address] [place],  [place] [address] PCP: Atilano Deward LELON, MD  Chief Complaint  Patient presents with   Right Leg - Follow-up      HPI: Patient is a 68 year old gentleman who presents in follow-up for lymphedema bilateral lower extremities with a left below-knee amputation and progressive swelling in the right lower extremity.  Patient states he was recently hospitalized secondary to congestive heart failure and a urinary tract infection.  Patient was treated with IV antibiotics and Levaquin.  Patient states that he sits in his motorized wheelchair 24 hours a day with his legs dependent.  Patient presents with increased blistered ulcerations to both lower extremities.  Assessment & Plan: Visit Diagnoses:  1. Venous ulcer of right leg (HCC)   2. Left below-knee amputee (HCC)   3. Lymphedema     Plan: Will have patient's legs wrapped daily with 4 x 4 gauze and Ace wrap's.  Reevaluate in 2 weeks.  Discussed the patient may require surgical intervention.  Follow-Up Instructions: Return in about 2 weeks (around 05/13/2024).   Ortho Exam  Patient is alert, oriented, no adenopathy, well-dressed, normal affect, normal respiratory effort. Examination patient has significant worsening of the blisters of both lower extremities.  There is circumferential ulceration now for the left transtibial amputation.  There is no ascending cellulitis this appears secondary to acute swelling from his congestive heart failure.  Patient also has acute increased swelling of the right lower extremity with increased swelling of the right foot and circumferential blistering of the leg.  There is weeping edema from both lower extremities.    Imaging: No results found.       Labs: Lab Results  Component Value Date    HGBA1C 7.3 (H) 10/17/2021   HGBA1C 6.5 (H) 09/12/2019   CRP 17.6 (H) 09/14/2019   CRP 14.7 (H) 09/13/2019   CRP 14.6 (H) 09/12/2019   REPTSTATUS 11/20/2021 FINAL 11/17/2021   CULT (A) 11/17/2021    >=100,000 COLONIES/mL ESCHERICHIA COLI >=100,000 COLONIES/mL KLEBSIELLA OXYTOCA    LABORGA ESCHERICHIA COLI (A) 11/17/2021   LABORGA KLEBSIELLA OXYTOCA (A) 11/17/2021     Lab Results  Component Value Date   ALBUMIN 2.6 (L) 11/10/2021   ALBUMIN 1.8 (L) 10/25/2021   ALBUMIN 2.5 (L) 10/14/2021    Lab Results  Component Value Date   MG 2.1 09/14/2019   MG 2.3 09/13/2019   MG 1.9 09/12/2019   No results found for: VD25OH  No results found for: PREALBUMIN    Latest Ref Rng & Units 12/21/2021    9:53 AM 11/10/2021    6:54 AM 10/31/2021    5:00 AM  CBC EXTENDED  WBC 4.0 - 10.5 K/uL 7.6  6.5  7.9   RBC 4.22 - 5.81 Mil/uL 4.57  3.88  3.22   Hemoglobin 13.0 - 17.0 g/dL 88.5  9.5  7.8   HCT 60.9 - 52.0 % 35.6  33.0  26.5   Platelets 150.0 - 400.0 K/uL 230.0  176  243   NEUT# 1.4 - 7.7 K/uL 5.3  4.3    Lymph# 0.7 - 4.0 K/uL 0.9  0.8  There is no height or weight on file to calculate BMI.  Orders:  No orders of the defined types were placed in this encounter.  No orders of the defined types were placed in this encounter.    Procedures: No procedures performed  Clinical Data: No additional findings.  ROS:  All other systems negative, except as noted in the HPI. Review of Systems  Objective: Vital Signs: There were no vitals taken for this visit.  Specialty Comments:  No specialty comments available.  PMFS History: Patient Active Problem List   Diagnosis Date Noted   Acute on chronic diastolic (congestive) heart failure (HCC)    Controlled type 2 diabetes mellitus with hyperglycemia, without long-term current use of insulin (HCC)    Phantom limb pain (HCC)    Left below-knee amputee (HCC) 10/25/2021   Left leg pain    Osteomyelitis of ankle and foot  (HCC) 10/15/2021   Anemia 10/15/2021   Pressure ulcer 10/15/2021   History of CVA (cerebrovascular accident) 10/15/2021   Septic arthritis of ankle and foot region (HCC) 10/15/2021   AKI (acute kidney injury) (HCC) 10/14/2021   Pneumonia due to COVID-19 virus 09/09/2019   Hypokalemia 09/09/2019   Acute respiratory failure with hypoxia (HCC) 09/09/2019   Essential hypertension 10/14/2014   Chronic diastolic heart failure (HCC) 10/14/2014   Morbid obesity (HCC) 07/22/2013   Varicose veins of bilateral lower extremities with other complications 07/22/2013   OSA (obstructive sleep apnea) 07/22/2013   Venous stasis ulcers (HCC) 07/22/2013   Diabetes mellitus (HCC) 01/03/2011   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HCC)    Diverticulosis    Essential hypertension    Gastritis    History of nephrolithiasis    History of open leg wound    History of stroke 03/19/2003   Lymphedema    Obstructive sleep apnea    Peptic ulcer    TIA (transient ischemic attack)    Varicose veins    Laser ablation of left great saphenous vein 2009    Family History  Problem Relation Age of Onset   Bone cancer Mother    Hypertension Father    Pancreatic cancer Maternal Grandmother    Stroke Paternal Grandmother    Diabetes Paternal Grandfather    Heart attack Brother    Hypertension Brother    Heart attack Sister     Past Surgical History:  Procedure Laterality Date   AMPUTATION Left 10/21/2021   Procedure: LEFT BELOW KNEE AMPUTATION;  Surgeon: Harden Jerona GAILS, MD;  Location: [facility] OR;  Service: Orthopedics;  Laterality: Left;   BIOPSY  10/19/2021   Procedure: BIOPSY;  Surgeon: Federico Rosario BROCKS, MD;  Location: [facility] ENDOSCOPY;  Service: Gastroenterology;;   COLONOSCOPY N/A 10/19/2021   Procedure: COLONOSCOPY;  Surgeon: Federico Rosario BROCKS, MD;  Location: [facility] ENDOSCOPY;  Service: Gastroenterology;  Laterality: N/A;   CYSTOSCOPY WITH RETROGRADE PYELOGRAM, URETEROSCOPY AND STENT  PLACEMENT Right 08/02/2015   Procedure: CYSTOSCOPY WITH RETROGRADE PYELOGRAM, URETEROSCOPY , STONE EXTRACTION AND STENT PLACEMENT;  Surgeon: Belvie LITTIE Clara, MD;  Location[facility];  Service: Urology;  Laterality: Right;   ESOPHAGOGASTRODUODENOSCOPY (EGD) WITH PROPOFOL N/A 10/19/2021   Procedure: ESOPHAGOGASTRODUODENOSCOPY (EGD) WITH PROPOFOL;  Surgeon: Federico Rosario BROCKS, MD;  Location: [facility] ENDOSCOPY;  Service: Gastroenterology;  Laterality: N/A;   HOLMIUM LASER APPLICATION Right 08/02/2015   Procedure: HOLMIUM LASER APPLICATION;  Surgeon: Belvie LITTIE Clara, MD;  Location: [facility];  Service: Urology;  Laterality: Right;  KIDNEY STONE SURGERY     POLYPECTOMY  10/19/2021   Procedure: POLYPECTOMY;  Surgeon: Federico Rosario BROCKS, MD;  Location: [facility] ENDOSCOPY;  Service: Gastroenterology;;   KATY SAYRES   VEIN SURGERY Left 06/2010   Laser ablation   Social History   Occupational History   Occupation: controll room operator     Employer: [employer]  Tobacco Use   Smoking status: Never    Passive exposure: Never   Smokeless tobacco: Never  Vaping Use   Vaping status: Never Used  Substance and Sexual Activity   Alcohol  use: No    Alcohol /week: 0.0 standard drinks of alcohol    Drug use: No   Sexual activity: Not on file

## 2024-04-30 ENCOUNTER — Telehealth: Payer: Self-pay

## 2024-04-30 NOTE — Telephone Encounter (Signed)
 I called and sw [facility] [phone]. Advised of pt's office visit yesterday and changes to wound care orders. Requested that I fax to [phone]. I faxed dictated note with updated orders to number as requested.

## 2024-05-02 DIAGNOSIS — B9689 Other specified bacterial agents as the cause of diseases classified elsewhere: Secondary | ICD-10-CM | POA: Diagnosis not present

## 2024-05-02 DIAGNOSIS — L97811 Non-pressure chronic ulcer of other part of right lower leg limited to breakdown of skin: Secondary | ICD-10-CM | POA: Diagnosis not present

## 2024-05-02 DIAGNOSIS — B964 Proteus (mirabilis) (morganii) as the cause of diseases classified elsewhere: Secondary | ICD-10-CM | POA: Diagnosis not present

## 2024-05-02 DIAGNOSIS — N390 Urinary tract infection, site not specified: Secondary | ICD-10-CM | POA: Diagnosis not present

## 2024-05-02 DIAGNOSIS — I872 Venous insufficiency (chronic) (peripheral): Secondary | ICD-10-CM | POA: Diagnosis not present

## 2024-05-02 DIAGNOSIS — L89892 Pressure ulcer of other site, stage 2: Secondary | ICD-10-CM | POA: Diagnosis not present

## 2024-05-05 DIAGNOSIS — I110 Hypertensive heart disease with heart failure: Secondary | ICD-10-CM | POA: Diagnosis not present

## 2024-05-05 DIAGNOSIS — I509 Heart failure, unspecified: Secondary | ICD-10-CM | POA: Diagnosis not present

## 2024-05-06 DIAGNOSIS — I872 Venous insufficiency (chronic) (peripheral): Secondary | ICD-10-CM | POA: Diagnosis not present

## 2024-05-06 DIAGNOSIS — L97811 Non-pressure chronic ulcer of other part of right lower leg limited to breakdown of skin: Secondary | ICD-10-CM | POA: Diagnosis not present

## 2024-05-06 DIAGNOSIS — N390 Urinary tract infection, site not specified: Secondary | ICD-10-CM | POA: Diagnosis not present

## 2024-05-06 DIAGNOSIS — B9689 Other specified bacterial agents as the cause of diseases classified elsewhere: Secondary | ICD-10-CM | POA: Diagnosis not present

## 2024-05-06 DIAGNOSIS — L89892 Pressure ulcer of other site, stage 2: Secondary | ICD-10-CM | POA: Diagnosis not present

## 2024-05-06 DIAGNOSIS — B964 Proteus (mirabilis) (morganii) as the cause of diseases classified elsewhere: Secondary | ICD-10-CM | POA: Diagnosis not present

## 2024-05-09 DIAGNOSIS — N390 Urinary tract infection, site not specified: Secondary | ICD-10-CM | POA: Diagnosis not present

## 2024-05-09 DIAGNOSIS — I872 Venous insufficiency (chronic) (peripheral): Secondary | ICD-10-CM | POA: Diagnosis not present

## 2024-05-09 DIAGNOSIS — B964 Proteus (mirabilis) (morganii) as the cause of diseases classified elsewhere: Secondary | ICD-10-CM | POA: Diagnosis not present

## 2024-05-09 DIAGNOSIS — L97811 Non-pressure chronic ulcer of other part of right lower leg limited to breakdown of skin: Secondary | ICD-10-CM | POA: Diagnosis not present

## 2024-05-09 DIAGNOSIS — L89892 Pressure ulcer of other site, stage 2: Secondary | ICD-10-CM | POA: Diagnosis not present

## 2024-05-09 DIAGNOSIS — B9689 Other specified bacterial agents as the cause of diseases classified elsewhere: Secondary | ICD-10-CM | POA: Diagnosis not present

## 2024-05-11 DIAGNOSIS — L859 Epidermal thickening, unspecified: Secondary | ICD-10-CM | POA: Diagnosis not present

## 2024-05-11 DIAGNOSIS — N179 Acute kidney failure, unspecified: Secondary | ICD-10-CM | POA: Diagnosis not present

## 2024-05-11 DIAGNOSIS — Z7982 Long term (current) use of aspirin: Secondary | ICD-10-CM | POA: Diagnosis not present

## 2024-05-11 DIAGNOSIS — I69351 Hemiplegia and hemiparesis following cerebral infarction affecting right dominant side: Secondary | ICD-10-CM | POA: Diagnosis not present

## 2024-05-11 DIAGNOSIS — E785 Hyperlipidemia, unspecified: Secondary | ICD-10-CM | POA: Diagnosis not present

## 2024-05-11 DIAGNOSIS — L97819 Non-pressure chronic ulcer of other part of right lower leg with unspecified severity: Secondary | ICD-10-CM | POA: Diagnosis not present

## 2024-05-11 DIAGNOSIS — I4510 Unspecified right bundle-branch block: Secondary | ICD-10-CM | POA: Diagnosis not present

## 2024-05-11 DIAGNOSIS — E1122 Type 2 diabetes mellitus with diabetic chronic kidney disease: Secondary | ICD-10-CM | POA: Diagnosis not present

## 2024-05-11 DIAGNOSIS — R0602 Shortness of breath: Secondary | ICD-10-CM | POA: Diagnosis not present

## 2024-05-11 DIAGNOSIS — N390 Urinary tract infection, site not specified: Secondary | ICD-10-CM | POA: Diagnosis not present

## 2024-05-11 DIAGNOSIS — Z79899 Other long term (current) drug therapy: Secondary | ICD-10-CM | POA: Diagnosis not present

## 2024-05-11 DIAGNOSIS — I440 Atrioventricular block, first degree: Secondary | ICD-10-CM | POA: Diagnosis not present

## 2024-05-11 DIAGNOSIS — Z880 Allergy status to penicillin: Secondary | ICD-10-CM | POA: Diagnosis not present

## 2024-05-11 DIAGNOSIS — J9621 Acute and chronic respiratory failure with hypoxia: Secondary | ICD-10-CM | POA: Diagnosis not present

## 2024-05-11 DIAGNOSIS — R0600 Dyspnea, unspecified: Secondary | ICD-10-CM | POA: Diagnosis not present

## 2024-05-11 DIAGNOSIS — Z9981 Dependence on supplemental oxygen: Secondary | ICD-10-CM | POA: Diagnosis not present

## 2024-05-11 DIAGNOSIS — I5033 Acute on chronic diastolic (congestive) heart failure: Secondary | ICD-10-CM | POA: Diagnosis not present

## 2024-05-11 DIAGNOSIS — R9389 Abnormal findings on diagnostic imaging of other specified body structures: Secondary | ICD-10-CM | POA: Diagnosis not present

## 2024-05-11 DIAGNOSIS — Z7902 Long term (current) use of antithrombotics/antiplatelets: Secondary | ICD-10-CM | POA: Diagnosis not present

## 2024-05-11 DIAGNOSIS — J9602 Acute respiratory failure with hypercapnia: Secondary | ICD-10-CM | POA: Diagnosis not present

## 2024-05-11 DIAGNOSIS — N289 Disorder of kidney and ureter, unspecified: Secondary | ICD-10-CM | POA: Diagnosis not present

## 2024-05-11 DIAGNOSIS — I10 Essential (primary) hypertension: Secondary | ICD-10-CM | POA: Diagnosis not present

## 2024-05-11 DIAGNOSIS — B952 Enterococcus as the cause of diseases classified elsewhere: Secondary | ICD-10-CM | POA: Diagnosis not present

## 2024-05-11 DIAGNOSIS — E119 Type 2 diabetes mellitus without complications: Secondary | ICD-10-CM | POA: Diagnosis not present

## 2024-05-11 DIAGNOSIS — N183 Chronic kidney disease, stage 3 unspecified: Secondary | ICD-10-CM | POA: Diagnosis not present

## 2024-05-11 DIAGNOSIS — I130 Hypertensive heart and chronic kidney disease with heart failure and stage 1 through stage 4 chronic kidney disease, or unspecified chronic kidney disease: Secondary | ICD-10-CM | POA: Diagnosis not present

## 2024-05-11 DIAGNOSIS — R0989 Other specified symptoms and signs involving the circulatory and respiratory systems: Secondary | ICD-10-CM | POA: Diagnosis not present

## 2024-05-11 DIAGNOSIS — Z89512 Acquired absence of left leg below knee: Secondary | ICD-10-CM | POA: Diagnosis not present

## 2024-05-11 DIAGNOSIS — I517 Cardiomegaly: Secondary | ICD-10-CM | POA: Diagnosis not present

## 2024-05-11 DIAGNOSIS — J9622 Acute and chronic respiratory failure with hypercapnia: Secondary | ICD-10-CM | POA: Diagnosis not present

## 2024-05-11 DIAGNOSIS — L03115 Cellulitis of right lower limb: Secondary | ICD-10-CM | POA: Diagnosis not present

## 2024-05-22 ENCOUNTER — Ambulatory Visit (INDEPENDENT_AMBULATORY_CARE_PROVIDER_SITE_OTHER): Admitting: Orthopedic Surgery

## 2024-05-22 ENCOUNTER — Encounter: Payer: Self-pay | Admitting: Orthopedic Surgery

## 2024-05-22 DIAGNOSIS — I890 Lymphedema, not elsewhere classified: Secondary | ICD-10-CM | POA: Diagnosis not present

## 2024-05-22 DIAGNOSIS — L97919 Non-pressure chronic ulcer of unspecified part of right lower leg with unspecified severity: Secondary | ICD-10-CM | POA: Diagnosis not present

## 2024-05-22 DIAGNOSIS — Z89512 Acquired absence of left leg below knee: Secondary | ICD-10-CM

## 2024-05-22 DIAGNOSIS — I83019 Varicose veins of right lower extremity with ulcer of unspecified site: Secondary | ICD-10-CM

## 2024-05-22 NOTE — Progress Notes (Signed)
 Office Visit Note   Patient: [name] [name]           Date of Birth: July 16, 1956           MRN: [id number] Visit Date: 05/22/2024              Requested by: Sasser, Deward LELON, MD [address] [place],  [place] [address] PCP: Atilano Deward LELON, MD  Chief Complaint  Patient presents with   Left Leg - Follow-up   Right Leg - Follow-up      HPI: Discussed the use of AI scribe software for clinical note transcription with the patient, who gave verbal consent to proceed.  History of Present Illness [name] [name] is a 68 year old male with CHF exacerbation and sepsis who presents for follow-up after hospitalization.  He was hospitalized from August 24th to August 29th for a CHF exacerbation and sepsis secondary to a urinary tract infection. He was treated with antibiotics and is currently on levofloxacin for a few more days. He notes improvement in symptoms and swelling in his legs.  He has a history of superficial ulcers on his legs due to swelling, which have shown improvement. He manages the ulcers with gauze, Ace wraps, and ABD pads, and elevates his legs at home. He notes a dark spot on the inside of his ankle that has improved over the past few days.  He experiences intermittent sharp pain in his legs, described as feeling like 'an ice pick' stabbing, lasting only a few seconds. He is currently taking gabapentin  but is unsure of its effectiveness.  His diuretic medication was changed from Lasix  to Bumex, which has helped reduce swelling in his legs. He uses his BiPAP machine consistently for at least eight hours each night.  Home health services have not resumed despite a referral being sent. Previously, he visited twice a week, and his caregiver managed dressing changes on other days.     Assessment & Plan: Visit Diagnoses:  1. Venous ulcer of right leg (HCC)   2. Left below-knee amputee (HCC)   3. Lymphedema     Plan: Assessment and Plan Assessment & Plan Chronic  lower extremity edema with superficial ulcers and petechial bleeding Edema and ulcers improving. No infection signs. - Continue daily dressing changes with gauze, ABD pads, and Exudate pads. - Encourage leg elevation. - Contact home health agency for services twice a week.  Left transtibial amputation with improved swelling Swelling improved. No infection or complications. Diuretics effective. - Continue diuretic therapy with Bumex.  Muscle cramps secondary to diuretic use Cramps likely due to electrolyte imbalance from diuretics. Gabapentin  use uncertain. Improved kidney function allows electrolyte supplementation. - Advise daily use of over-the-counter electrolyte drinks or pouches.      Follow-Up Instructions: Return in about 4 weeks (around 06/19/2024).   Ortho Exam  Patient is alert, oriented, no adenopathy, well-dressed, normal affect, normal respiratory effort. Physical Exam EXTREMITIES: Improved decreased swelling in the right lower extremity and improved swelling in the left transtibular amputation. Petechial bleeding in both lower extremities shows improvement.  There is no cellulitis the small weeping wounds have improved no signs of infection either lower extremity.  No pain to palpation of either leg.      Imaging: No results found. No images are attached to the encounter.  Labs: Lab Results  Component Value Date   HGBA1C 7.3 (H) 10/17/2021   HGBA1C 6.5 (H) 09/12/2019   CRP 17.6 (H) 09/14/2019  CRP 14.7 (H) 09/13/2019   CRP 14.6 (H) 09/12/2019   REPTSTATUS 11/20/2021 FINAL 11/17/2021   CULT (A) 11/17/2021    >=100,000 COLONIES/mL ESCHERICHIA COLI >=100,000 COLONIES/mL KLEBSIELLA OXYTOCA    LABORGA ESCHERICHIA COLI (A) 11/17/2021   LABORGA KLEBSIELLA OXYTOCA (A) 11/17/2021     Lab Results  Component Value Date   ALBUMIN 2.6 (L) 11/10/2021   ALBUMIN 1.8 (L) 10/25/2021   ALBUMIN 2.5 (L) 10/14/2021    Lab Results  Component Value Date   MG 2.1  09/14/2019   MG 2.3 09/13/2019   MG 1.9 09/12/2019   No results found for: VD25OH  No results found for: PREALBUMIN    Latest Ref Rng & Units 12/21/2021    9:53 AM 11/10/2021    6:54 AM 10/31/2021    5:00 AM  CBC EXTENDED  WBC 4.0 - 10.5 K/uL 7.6  6.5  7.9   RBC 4.22 - 5.81 Mil/uL 4.57  3.88  3.22   Hemoglobin 13.0 - 17.0 g/dL 88.5  9.5  7.8   HCT 60.9 - 52.0 % 35.6  33.0  26.5   Platelets 150.0 - 400.0 K/uL 230.0  176  243   NEUT# 1.4 - 7.7 K/uL 5.3  4.3    Lymph# 0.7 - 4.0 K/uL 0.9  0.8       There is no height or weight on file to calculate BMI.  Orders:  No orders of the defined types were placed in this encounter.  No orders of the defined types were placed in this encounter.    Procedures: No procedures performed  Clinical Data: No additional findings.  ROS:  All other systems negative, except as noted in the HPI. Review of Systems  Objective: Vital Signs: There were no vitals taken for this visit.  Specialty Comments:  No specialty comments available.  PMFS History: Patient Active Problem List   Diagnosis Date Noted   Acute on chronic diastolic (congestive) heart failure (HCC)    Controlled type 2 diabetes mellitus with hyperglycemia, without long-term current use of insulin  (HCC)    Phantom limb pain (HCC)    Left below-knee amputee (HCC) 10/25/2021   Left leg pain    Osteomyelitis of ankle and foot (HCC) 10/15/2021   Anemia 10/15/2021   Pressure ulcer 10/15/2021   History of CVA (cerebrovascular accident) 10/15/2021   Septic arthritis of ankle and foot region (HCC) 10/15/2021   AKI (acute kidney injury) (HCC) 10/14/2021   Pneumonia due to COVID-19 virus 09/09/2019   Hypokalemia 09/09/2019   Acute respiratory failure with hypoxia (HCC) 09/09/2019   Essential hypertension 10/14/2014   Chronic diastolic heart failure (HCC) 10/14/2014   Morbid obesity (HCC) 07/22/2013   Varicose veins of bilateral lower extremities with other complications  07/22/2013   OSA (obstructive sleep apnea) 07/22/2013   Venous stasis ulcers (HCC) 07/22/2013   Diabetes mellitus (HCC) 01/03/2011   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HCC)    Diverticulosis    Essential hypertension    Gastritis    History of nephrolithiasis    History of open leg wound    History of stroke 03/19/2003   Lymphedema    Obstructive sleep apnea    Peptic ulcer    TIA (transient ischemic attack)    Varicose veins    Laser ablation of left great saphenous vein 2009    Family History  Problem Relation Age of Onset   Bone cancer Mother  Hypertension Father    Pancreatic cancer Maternal Grandmother    Stroke Paternal Grandmother    Diabetes Paternal Grandfather    Heart attack Brother    Hypertension Brother    Heart attack Sister     Past Surgical History:  Procedure Laterality Date   AMPUTATION Left 10/21/2021   Procedure: LEFT BELOW KNEE AMPUTATION;  Surgeon: Harden Jerona GAILS, MD;  Location: [facility] OR;  Service: Orthopedics;  Laterality: Left;   BIOPSY  10/19/2021   Procedure: BIOPSY;  Surgeon: Federico Rosario BROCKS, MD;  Location: [facility] ENDOSCOPY;  Service: Gastroenterology;;   COLONOSCOPY N/A 10/19/2021   Procedure: COLONOSCOPY;  Surgeon: Federico Rosario BROCKS, MD;  Location: [facility] ENDOSCOPY;  Service: Gastroenterology;  Laterality: N/A;   CYSTOSCOPY WITH RETROGRADE PYELOGRAM, URETEROSCOPY AND STENT PLACEMENT Right 08/02/2015   Procedure: CYSTOSCOPY WITH RETROGRADE PYELOGRAM, URETEROSCOPY , STONE EXTRACTION AND STENT PLACEMENT;  Surgeon: Belvie LITTIE Clara, MD;  Location: [facility] ORS;  Service: Urology;  Laterality: Right;   ESOPHAGOGASTRODUODENOSCOPY (EGD) WITH PROPOFOL  N/A 10/19/2021   Procedure: ESOPHAGOGASTRODUODENOSCOPY (EGD) WITH PROPOFOL ;  Surgeon: Federico Rosario BROCKS, MD;  Location: [facility] ENDOSCOPY;  Service: Gastroenterology;  Laterality: N/A;   HOLMIUM LASER APPLICATION Right 08/02/2015   Procedure: HOLMIUM LASER APPLICATION;  Surgeon: Belvie LITTIE Clara, MD;  Location: [facility] ORS;  Service: Urology;  Laterality: Right;   KIDNEY STONE SURGERY     POLYPECTOMY  10/19/2021   Procedure: POLYPECTOMY;  Surgeon: Federico Rosario BROCKS, MD;  Location: [facility] ENDOSCOPY;  Service: Gastroenterology;;   KATY  1984   VEIN SURGERY Left 06/2010   Laser ablation   Social History   Occupational History   Occupation: controll room operator     Employer: [employer]  Tobacco Use   Smoking status: Never    Passive exposure: Never   Smokeless tobacco: Never  Vaping Use   Vaping status: Never Used  Substance and Sexual Activity   Alcohol  use: No    Alcohol /week: 0.0 standard drinks of alcohol    Drug use: No   Sexual activity: Not on file

## 2024-05-23 DIAGNOSIS — N390 Urinary tract infection, site not specified: Secondary | ICD-10-CM | POA: Diagnosis not present

## 2024-05-23 DIAGNOSIS — B9689 Other specified bacterial agents as the cause of diseases classified elsewhere: Secondary | ICD-10-CM | POA: Diagnosis not present

## 2024-05-23 DIAGNOSIS — I872 Venous insufficiency (chronic) (peripheral): Secondary | ICD-10-CM | POA: Diagnosis not present

## 2024-05-23 DIAGNOSIS — L89892 Pressure ulcer of other site, stage 2: Secondary | ICD-10-CM | POA: Diagnosis not present

## 2024-05-23 DIAGNOSIS — L97811 Non-pressure chronic ulcer of other part of right lower leg limited to breakdown of skin: Secondary | ICD-10-CM | POA: Diagnosis not present

## 2024-05-23 DIAGNOSIS — B964 Proteus (mirabilis) (morganii) as the cause of diseases classified elsewhere: Secondary | ICD-10-CM | POA: Diagnosis not present

## 2024-05-26 DIAGNOSIS — Z09 Encounter for follow-up examination after completed treatment for conditions other than malignant neoplasm: Secondary | ICD-10-CM | POA: Diagnosis not present

## 2024-05-26 DIAGNOSIS — G64 Other disorders of peripheral nervous system: Secondary | ICD-10-CM | POA: Diagnosis not present

## 2024-05-26 DIAGNOSIS — Z758 Other problems related to medical facilities and other health care: Secondary | ICD-10-CM | POA: Diagnosis not present

## 2024-05-26 DIAGNOSIS — J9690 Respiratory failure, unspecified, unspecified whether with hypoxia or hypercapnia: Secondary | ICD-10-CM | POA: Diagnosis not present

## 2024-05-26 DIAGNOSIS — N390 Urinary tract infection, site not specified: Secondary | ICD-10-CM | POA: Diagnosis not present

## 2024-05-26 DIAGNOSIS — B372 Candidiasis of skin and nail: Secondary | ICD-10-CM | POA: Diagnosis not present

## 2024-05-28 DIAGNOSIS — E785 Hyperlipidemia, unspecified: Secondary | ICD-10-CM | POA: Diagnosis not present

## 2024-05-28 DIAGNOSIS — I10 Essential (primary) hypertension: Secondary | ICD-10-CM | POA: Diagnosis not present

## 2024-05-28 DIAGNOSIS — I5022 Chronic systolic (congestive) heart failure: Secondary | ICD-10-CM | POA: Diagnosis not present

## 2024-06-05 DIAGNOSIS — I509 Heart failure, unspecified: Secondary | ICD-10-CM | POA: Diagnosis not present

## 2024-06-05 DIAGNOSIS — I110 Hypertensive heart disease with heart failure: Secondary | ICD-10-CM | POA: Diagnosis not present

## 2024-06-19 ENCOUNTER — Ambulatory Visit: Admitting: Orthopedic Surgery

## 2024-06-19 DIAGNOSIS — I890 Lymphedema, not elsewhere classified: Secondary | ICD-10-CM

## 2024-06-19 DIAGNOSIS — L97919 Non-pressure chronic ulcer of unspecified part of right lower leg with unspecified severity: Secondary | ICD-10-CM | POA: Diagnosis not present

## 2024-06-19 DIAGNOSIS — I83019 Varicose veins of right lower extremity with ulcer of unspecified site: Secondary | ICD-10-CM | POA: Diagnosis not present

## 2024-06-19 DIAGNOSIS — Z89512 Acquired absence of left leg below knee: Secondary | ICD-10-CM

## 2024-06-23 ENCOUNTER — Encounter: Payer: Self-pay | Admitting: Orthopedic Surgery

## 2024-06-23 NOTE — Progress Notes (Signed)
 Office Visit Note   Patient: [name] [name]           Date of Birth: Jul 02, 1956           MRN: [id number] Visit Date: 06/19/2024              Requested by: Sasser, Deward LELON, MD [address] [place],  [place] [address] PCP: Atilano Deward LELON, MD  Chief Complaint  Patient presents with   Right Leg - Follow-up   Left Leg - Follow-up      HPI: Discussed the use of AI scribe software for clinical note transcription with the patient, who gave verbal consent to proceed.  History of Present Illness [name] [name] is a 68 year old male who presents for follow-up of lower extremity swelling and wound care.  He has experienced significant improvement in the swelling of his right foot and both lower extremities.  He has been using a wound cleanser and applying a small piece of Vaseline gauze to prevent the skin from pulling when changing dressings. This has improved the appearance of his wounds. He uses ABD pads and a non-stick dressing called Exudate to cover the wounds.  He has stopped taking Plavix , which he refers to as 'the blood sun,' and notes a significant reduction in swelling since discontinuing the medication.  He has been using a BiPAP machine for about a month, which allows him to sleep flat and elevate his legs and feet, contributing to the reduction in swelling.     Assessment & Plan: Visit Diagnoses: No diagnosis found.  Plan: Assessment and Plan Assessment & Plan Chronic bilateral lower extremity ulcers and edema, left below-knee amputation stump Improved epithelialization and decreased swelling in left stump. Superficial ulcers with flat granulation, no infection. Right foot swelling reduced, healthy granulation, improved epithelialization. Improvement linked to CPAP use. - Start Bosch dressing changes daily. - Re-evaluate in two months. - Follow up sooner if wound beds change.      Follow-Up Instructions: No follow-ups on file.   Ortho Exam  Patient is  alert, oriented, no adenopathy, well-dressed, normal affect, normal respiratory effort. Physical Exam EXTREMITIES: Left BKA with improved epithelialization and decreased swelling. Left lower extremity with superficial ulcers, flat granulation tissue, no infection, no drainage. Right lower extremity with significantly improved foot swelling. Right leg with healthy granulation tissue and improved epithelialization.      Imaging: No results found. No images are attached to the encounter.  Labs: Lab Results  Component Value Date   HGBA1C 7.3 (H) 10/17/2021   HGBA1C 6.5 (H) 09/12/2019   CRP 17.6 (H) 09/14/2019   CRP 14.7 (H) 09/13/2019   CRP 14.6 (H) 09/12/2019   REPTSTATUS 11/20/2021 FINAL 11/17/2021   CULT (A) 11/17/2021    >=100,000 COLONIES/mL ESCHERICHIA COLI >=100,000 COLONIES/mL KLEBSIELLA OXYTOCA    LABORGA ESCHERICHIA COLI (A) 11/17/2021   LABORGA KLEBSIELLA OXYTOCA (A) 11/17/2021     Lab Results  Component Value Date   ALBUMIN 2.6 (L) 11/10/2021   ALBUMIN 1.8 (L) 10/25/2021   ALBUMIN 2.5 (L) 10/14/2021    Lab Results  Component Value Date   MG 2.1 09/14/2019   MG 2.3 09/13/2019   MG 1.9 09/12/2019   No results found for: VD25OH  No results found for: PREALBUMIN    Latest Ref Rng & Units 12/21/2021    9:53 AM 11/10/2021    6:54 AM 10/31/2021    5:00 AM  CBC EXTENDED  WBC 4.0 -  10.5 K/uL 7.6  6.5  7.9   RBC 4.22 - 5.81 Mil/uL 4.57  3.88  3.22   Hemoglobin 13.0 - 17.0 g/dL 88.5  9.5  7.8   HCT 60.9 - 52.0 % 35.6  33.0  26.5   Platelets 150.0 - 400.0 K/uL 230.0  176  243   NEUT# 1.4 - 7.7 K/uL 5.3  4.3    Lymph# 0.7 - 4.0 K/uL 0.9  0.8       There is no height or weight on file to calculate BMI.  Orders:  No orders of the defined types were placed in this encounter.  No orders of the defined types were placed in this encounter.    Procedures: No procedures performed  Clinical Data: No additional findings.  ROS:  All other systems negative,  except as noted in the HPI. Review of Systems  Objective: Vital Signs: There were no vitals taken for this visit.  Specialty Comments:  No specialty comments available.  PMFS History: Patient Active Problem List   Diagnosis Date Noted   Acute on chronic diastolic (congestive) heart failure (HCC)    Controlled type 2 diabetes mellitus with hyperglycemia, without long-term current use of insulin  (HCC)    Phantom limb pain (HCC)    Left below-knee amputee (HCC) 10/25/2021   Left leg pain    Osteomyelitis of ankle and foot (HCC) 10/15/2021   Anemia 10/15/2021   Pressure ulcer 10/15/2021   History of CVA (cerebrovascular accident) 10/15/2021   Septic arthritis of ankle and foot region (HCC) 10/15/2021   AKI (acute kidney injury) 10/14/2021   Pneumonia due to COVID-19 virus 09/09/2019   Hypokalemia 09/09/2019   Acute respiratory failure with hypoxia (HCC) 09/09/2019   Essential hypertension 10/14/2014   Chronic diastolic heart failure (HCC) 10/14/2014   Morbid obesity (HCC) 07/22/2013   Varicose veins of bilateral lower extremities with other complications 07/22/2013   OSA (obstructive sleep apnea) 07/22/2013   Venous stasis ulcers (HCC) 07/22/2013   Diabetes mellitus (HCC) 01/03/2011   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HCC)    Diverticulosis    Essential hypertension    Gastritis    History of nephrolithiasis    History of open leg wound    History of stroke 03/19/2003   Lymphedema    Obstructive sleep apnea    Peptic ulcer    TIA (transient ischemic attack)    Varicose veins    Laser ablation of left great saphenous vein 2009    Family History  Problem Relation Age of Onset   Bone cancer Mother    Hypertension Father    Pancreatic cancer Maternal Grandmother    Stroke Paternal Grandmother    Diabetes Paternal Grandfather    Heart attack Brother    Hypertension Brother    Heart attack Sister     Past Surgical  History:  Procedure Laterality Date   AMPUTATION Left 10/21/2021   Procedure: LEFT BELOW KNEE AMPUTATION;  Surgeon: Harden Jerona GAILS, MD;  Location: [facility] OR;  Service: Orthopedics;  Laterality: Left;   BIOPSY  10/19/2021   Procedure: BIOPSY;  Surgeon: Federico Rosario BROCKS, MD;  Location: [facility] ENDOSCOPY;  Service: Gastroenterology;;   COLONOSCOPY N/A 10/19/2021   Procedure: COLONOSCOPY;  Surgeon: Federico Rosario BROCKS, MD;  Location: [facility] ENDOSCOPY;  Service: Gastroenterology;  Laterality: N/A;   CYSTOSCOPY WITH RETROGRADE PYELOGRAM, URETEROSCOPY AND STENT PLACEMENT Right 08/02/2015   Procedure: CYSTOSCOPY WITH RETROGRADE PYELOGRAM, URETEROSCOPY , STONE  EXTRACTION AND STENT PLACEMENT;  Surgeon: Belvie LITTIE Clara, MD;  Location: [facility] ORS;  Service: Urology;  Laterality: Right;   ESOPHAGOGASTRODUODENOSCOPY (EGD) WITH PROPOFOL  N/A 10/19/2021   Procedure: ESOPHAGOGASTRODUODENOSCOPY (EGD) WITH PROPOFOL ;  Surgeon: Federico Rosario BROCKS, MD;  Location: [facility] ENDOSCOPY;  Service: Gastroenterology;  Laterality: N/A;   HOLMIUM LASER APPLICATION Right 08/02/2015   Procedure: HOLMIUM LASER APPLICATION;  Surgeon: Belvie LITTIE Clara, MD;  Location: [facility] ORS;  Service: Urology;  Laterality: Right;   KIDNEY STONE SURGERY     POLYPECTOMY  10/19/2021   Procedure: POLYPECTOMY;  Surgeon: Federico Rosario BROCKS, MD;  Location: [facility] ENDOSCOPY;  Service: Gastroenterology;;   KATY  1984   VEIN SURGERY Left 06/2010   Laser ablation   Social History   Occupational History   Occupation: controll room operator     Employer: [employer]  Tobacco Use   Smoking status: Never    Passive exposure: Never   Smokeless tobacco: Never  Vaping Use   Vaping status: Never Used  Substance and Sexual Activity   Alcohol  use: No    Alcohol /week: 0.0 standard drinks of alcohol    Drug use: No   Sexual activity: Not on file

## 2024-07-04 ENCOUNTER — Telehealth: Payer: Self-pay | Admitting: Orthopedic Surgery

## 2024-07-04 NOTE — Telephone Encounter (Signed)
 Jessica called. She is the nurse working with patient in home. Says his R leg is looking bad. She would like new verbal orders for wound care. Would like to apply polymem to wound and cover with ABD and wrap with 2 layer compression. Her cb# is [phone]

## 2024-07-07 NOTE — Telephone Encounter (Signed)
 Called and sw [name] [name] and she will apply Polymem dressing on the right and a 2 layer compression dressing on the right and polymem and ABD with ace bandage.  Advised verbal ok and she will call with any changes.

## 2024-07-21 ENCOUNTER — Encounter: Payer: Self-pay | Admitting: Radiology

## 2024-08-19 ENCOUNTER — Ambulatory Visit: Admitting: Orthopedic Surgery

## 2024-08-19 DIAGNOSIS — I890 Lymphedema, not elsewhere classified: Secondary | ICD-10-CM

## 2024-08-19 DIAGNOSIS — I83019 Varicose veins of right lower extremity with ulcer of unspecified site: Secondary | ICD-10-CM

## 2024-08-19 DIAGNOSIS — Z89512 Acquired absence of left leg below knee: Secondary | ICD-10-CM

## 2024-08-19 DIAGNOSIS — L97919 Non-pressure chronic ulcer of unspecified part of right lower leg with unspecified severity: Secondary | ICD-10-CM | POA: Diagnosis not present

## 2024-08-20 ENCOUNTER — Encounter: Payer: Self-pay | Admitting: Orthopedic Surgery

## 2024-08-20 NOTE — Progress Notes (Signed)
 Office Visit Note   Patient: [name] [name]           Date of Birth: October 07, 1955           MRN: [id number] Visit Date: 08/19/2024              Requested by: Sasser, Deward LELON, MD [address] [place],  [place] [address] PCP: Atilano Deward LELON, MD  Chief Complaint  Patient presents with   Right Leg - Follow-up   Left Leg - Follow-up      HPI: Discussed the use of AI scribe software for clinical note transcription with the patient, who gave verbal consent to proceed.  History of Present Illness [name] [name] is a 68 year old male who presents with wound care management for his lower extremity wounds.  He is undergoing wound care management for his lower extremity wounds, which are wrapped twice a week using an Unna boot and a cream, likely triamcinolone , to prevent sticking. The cream is applied to the pink areas before dressing. The area of concern is located where the bone ends.  Previously, all the skin on his leg was gone, but now there is superficial skin and no bleeding. His leg is less swollen, and he is able to sleep in a bed every night, allowing him to elevate his legs. He feels that the swelling has improved significantly and the leg looks better.     Assessment & Plan: Visit Diagnoses:  1. Venous ulcer of right leg (HCC)   2. Left below-knee amputee (HCC)   3. Lymphedema     Plan: Assessment and Plan Assessment & Plan Chronic wounds of left transtibial amputation stump Chronic wounds with superficial epithelialization and petechial bleeding, indicating improved circulation and healing. - Continue alginate dressing changes. - Apply Unna boot and Ace wrap to the left leg.  Chronic wounds of right lower extremity Chronic wounds with superficial epithelialization and petechial bleeding, indicating improved circulation and healing. - Continue alginate dressing changes. - Apply Unna boot and Ace wrap to the right leg.  Bilateral lower extremity  edema Significant decrease in edema, improved management with bed rest. - Continue current management and elevation of legs.      Follow-Up Instructions: Return in about 4 weeks (around 09/16/2024).   Ortho Exam  Patient is alert, oriented, no adenopathy, well-dressed, normal affect, normal respiratory effort. Physical Exam EXTREMITIES: Left transtibular amputation with good petechial bleeding and superficial epithelialization over multiple wounds. Right lower extremity multiple wounds with superficial epithelialization and good petechial bleeding. Significant decreased swelling in both lower extremities.      Imaging: No results found. No images are attached to the encounter.  Labs: Lab Results  Component Value Date   HGBA1C 7.3 (H) 10/17/2021   HGBA1C 6.5 (H) 09/12/2019   CRP 17.6 (H) 09/14/2019   CRP 14.7 (H) 09/13/2019   CRP 14.6 (H) 09/12/2019   REPTSTATUS 11/20/2021 FINAL 11/17/2021   CULT (A) 11/17/2021    >=100,000 COLONIES/mL ESCHERICHIA COLI >=100,000 COLONIES/mL KLEBSIELLA OXYTOCA    LABORGA ESCHERICHIA COLI (A) 11/17/2021   LABORGA KLEBSIELLA OXYTOCA (A) 11/17/2021     Lab Results  Component Value Date   ALBUMIN 2.6 (L) 11/10/2021   ALBUMIN 1.8 (L) 10/25/2021   ALBUMIN 2.5 (L) 10/14/2021    Lab Results  Component Value Date   MG 2.1 09/14/2019   MG 2.3 09/13/2019   MG 1.9 09/12/2019   No results found for: VD25OH  No  results found for: PREALBUMIN    Latest Ref Rng & Units 12/21/2021    9:53 AM 11/10/2021    6:54 AM 10/31/2021    5:00 AM  CBC EXTENDED  WBC 4.0 - 10.5 K/uL 7.6  6.5  7.9   RBC 4.22 - 5.81 Mil/uL 4.57  3.88  3.22   Hemoglobin 13.0 - 17.0 g/dL 88.5  9.5  7.8   HCT 60.9 - 52.0 % 35.6  33.0  26.5   Platelets 150.0 - 400.0 K/uL 230.0  176  243   NEUT# 1.4 - 7.7 K/uL 5.3  4.3    Lymph# 0.7 - 4.0 K/uL 0.9  0.8       There is no height or weight on file to calculate BMI.  Orders:  No orders of the defined types were placed  in this encounter.  No orders of the defined types were placed in this encounter.    Procedures: No procedures performed  Clinical Data: No additional findings.  ROS:  All other systems negative, except as noted in the HPI. Review of Systems  Objective: Vital Signs: There were no vitals taken for this visit.  Specialty Comments:  No specialty comments available.  PMFS History: Patient Active Problem List   Diagnosis Date Noted   Acute on chronic diastolic (congestive) heart failure (HCC)    Controlled type 2 diabetes mellitus with hyperglycemia, without long-term current use of insulin  (HCC)    Phantom limb pain (HCC)    Left below-knee amputee (HCC) 10/25/2021   Left leg pain    Osteomyelitis of ankle and foot (HCC) 10/15/2021   Anemia 10/15/2021   Pressure ulcer 10/15/2021   History of CVA (cerebrovascular accident) 10/15/2021   Septic arthritis of ankle and foot region (HCC) 10/15/2021   AKI (acute kidney injury) 10/14/2021   Pneumonia due to COVID-19 virus 09/09/2019   Hypokalemia 09/09/2019   Acute respiratory failure with hypoxia (HCC) 09/09/2019   Essential hypertension 10/14/2014   Chronic diastolic heart failure (HCC) 10/14/2014   Morbid obesity (HCC) 07/22/2013   Varicose veins of bilateral lower extremities with other complications 07/22/2013   OSA (obstructive sleep apnea) 07/22/2013   Venous stasis ulcers (HCC) 07/22/2013   Diabetes mellitus (HCC) 01/03/2011   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HCC)    Diverticulosis    Essential hypertension    Gastritis    History of nephrolithiasis    History of open leg wound    History of stroke 03/19/2003   Lymphedema    Obstructive sleep apnea    Peptic ulcer    TIA (transient ischemic attack)    Varicose veins    Laser ablation of left great saphenous vein 2009    Family History  Problem Relation Age of Onset   Bone cancer Mother    Hypertension Father     Pancreatic cancer Maternal Grandmother    Stroke Paternal Grandmother    Diabetes Paternal Grandfather    Heart attack Brother    Hypertension Brother    Heart attack Sister     Past Surgical History:  Procedure Laterality Date   AMPUTATION Left 10/21/2021   Procedure: LEFT BELOW KNEE AMPUTATION;  Surgeon: Harden Jerona GAILS, MD;  Location: [facility] OR;  Service: Orthopedics;  Laterality: Left;   BIOPSY  10/19/2021   Procedure: BIOPSY;  Surgeon: Federico Rosario BROCKS, MD;  Location: [facility];  Service: Gastroenterology;;   COLONOSCOPY N/A 10/19/2021   Procedure: COLONOSCOPY;  Surgeon: Federico Rosario BROCKS, MD;  Location: [facility];  Service: Gastroenterology;  Laterality: N/A;   CYSTOSCOPY WITH RETROGRADE PYELOGRAM, URETEROSCOPY AND STENT PLACEMENT Right 08/02/2015   Procedure: CYSTOSCOPY WITH RETROGRADE PYELOGRAM, URETEROSCOPY , STONE EXTRACTION AND STENT PLACEMENT;  Surgeon: Belvie LITTIE Clara, MD;  Location: [facility];  Service: Urology;  Laterality: Right;   ESOPHAGOGASTRODUODENOSCOPY (EGD) WITH PROPOFOL  N/A 10/19/2021   Procedure: ESOPHAGOGASTRODUODENOSCOPY (EGD) WITH PROPOFOL ;  Surgeon: Federico Rosario BROCKS, MD;  Location: [facility] ENDOSCOPY;  Service: Gastroenterology;  Laterality: N/A;   HOLMIUM LASER APPLICATION Right 08/02/2015   Procedure: HOLMIUM LASER APPLICATION;  Surgeon: Belvie LITTIE Clara, MD;  Location: [facility];  Service: Urology;  Laterality: Right;   KIDNEY STONE SURGERY     POLYPECTOMY  10/19/2021   Procedure: POLYPECTOMY;  Surgeon: Federico Rosario BROCKS, MD;  Location: [facility] END[facility]COPY;  Service: Gastroenterology;;   KATY  1984   VEIN SURGERY Left 06/2010   Laser ablation   Social History   Occupational History   Occupation: controll room operator     Employer: [employer]  Tobacco Use   Smoking status: Never    Passive exposure: Never   Smokeless tobacco: Never  Vaping Use   Vaping status: Never Used  Substance and Sexual Activity   Alcohol  use: No    Alcohol /week: 0.0 standard drinks of alcohol     Drug use: No   Sexual activity: Not on file

## 2024-09-16 ENCOUNTER — Ambulatory Visit: Admitting: Physician Assistant

## 2024-09-23 ENCOUNTER — Ambulatory Visit: Admitting: Physician Assistant

## 2024-10-15 ENCOUNTER — Ambulatory Visit: Admitting: Physician Assistant

## 2024-10-20 ENCOUNTER — Ambulatory Visit: Admitting: Physician Assistant

## 2024-10-27 ENCOUNTER — Ambulatory Visit: Admitting: Physician Assistant
